# Patient Record
Sex: Female | Born: 1980 | Race: Black or African American | Hispanic: No | Marital: Single | Smoking: Never smoker
Health system: Southern US, Community
[De-identification: ages and names within clinical notes are randomized; demographics above are authoritative.]

## PROBLEM LIST (undated history)

## (undated) DIAGNOSIS — G4733 Obstructive sleep apnea (adult) (pediatric): Secondary | ICD-10-CM

## (undated) DIAGNOSIS — O10919 Unspecified pre-existing hypertension complicating pregnancy, unspecified trimester: Secondary | ICD-10-CM

## (undated) DIAGNOSIS — E785 Hyperlipidemia, unspecified: Secondary | ICD-10-CM

## (undated) DIAGNOSIS — R7303 Prediabetes: Secondary | ICD-10-CM

## (undated) DIAGNOSIS — R000 Tachycardia, unspecified: Secondary | ICD-10-CM

## (undated) DIAGNOSIS — N184 Chronic kidney disease, stage 4 (severe): Secondary | ICD-10-CM

## (undated) DIAGNOSIS — I272 Pulmonary hypertension, unspecified: Secondary | ICD-10-CM

## (undated) DIAGNOSIS — R600 Localized edema: Secondary | ICD-10-CM

## (undated) DIAGNOSIS — E119 Type 2 diabetes mellitus without complications: Secondary | ICD-10-CM

## (undated) DIAGNOSIS — J189 Pneumonia, unspecified organism: Secondary | ICD-10-CM

## (undated) DIAGNOSIS — O24419 Gestational diabetes mellitus in pregnancy, unspecified control: Secondary | ICD-10-CM

## (undated) DIAGNOSIS — I10 Essential (primary) hypertension: Secondary | ICD-10-CM

## (undated) DIAGNOSIS — R0602 Shortness of breath: Secondary | ICD-10-CM

## (undated) DIAGNOSIS — R339 Retention of urine, unspecified: Secondary | ICD-10-CM

## (undated) DIAGNOSIS — I110 Hypertensive heart disease with heart failure: Secondary | ICD-10-CM

## (undated) DIAGNOSIS — J411 Mucopurulent chronic bronchitis: Secondary | ICD-10-CM

## (undated) DIAGNOSIS — I5032 Chronic diastolic (congestive) heart failure: Secondary | ICD-10-CM

## (undated) HISTORY — DX: Hyperlipidemia, unspecified: E78.5

## (undated) HISTORY — DX: Tachycardia, unspecified: R00.0

## (undated) HISTORY — DX: Localized edema: R60.0

## (undated) HISTORY — DX: Pulmonary hypertension, unspecified: I27.20

## (undated) HISTORY — DX: Essential (primary) hypertension: I10

## (undated) HISTORY — DX: Hypertensive heart disease with heart failure: I11.0

## (undated) HISTORY — DX: Shortness of breath: R06.02

## (undated) HISTORY — DX: Morbid (severe) obesity due to excess calories: E66.01

## (undated) HISTORY — DX: Type 2 diabetes mellitus without complications: E11.9

## (undated) HISTORY — DX: Chronic kidney disease, stage 4 (severe): N18.4

## (undated) HISTORY — DX: Obstructive sleep apnea (adult) (pediatric): G47.33

## (undated) HISTORY — DX: Prediabetes: R73.03

## (undated) HISTORY — DX: Retention of urine, unspecified: R33.9

## (undated) HISTORY — DX: Chronic diastolic (congestive) heart failure: I50.32

## (undated) HISTORY — DX: Pneumonia, unspecified organism: J18.9

## (undated) HISTORY — DX: Mucopurulent chronic bronchitis: J41.1

---

## 2011-05-14 ENCOUNTER — Emergency Department (HOSPITAL_COMMUNITY)
Admission: EM | Admit: 2011-05-14 | Discharge: 2011-05-14 | Disposition: A | Discharge status: Home / Self Care | Payer: PRIVATE HEALTH INSURANCE | Attending: Emergency Medicine | Admitting: Emergency Medicine

## 2011-05-14 ENCOUNTER — Other Ambulatory Visit: Payer: Self-pay | Admitting: *Deleted

## 2011-05-14 DIAGNOSIS — R000 Tachycardia, unspecified: Secondary | ICD-10-CM

## 2011-05-14 DIAGNOSIS — I10 Essential (primary) hypertension: Secondary | ICD-10-CM

## 2011-05-14 DIAGNOSIS — G4730 Sleep apnea, unspecified: Secondary | ICD-10-CM

## 2011-05-14 LAB — POCT I-STAT, CHEM 8
BUN: 16 mg/dL (ref 6–23)
Calcium, Ion: 1.08 mmol/L — ABNORMAL LOW (ref 1.12–1.32)
Chloride: 95 mEq/L — ABNORMAL LOW (ref 96–112)
Creatinine, Ser: 0.8 mg/dL (ref 0.50–1.10)
Glucose, Bld: 105 mg/dL — ABNORMAL HIGH (ref 70–99)
HCT: 54 % — ABNORMAL HIGH (ref 36.0–46.0)
Hemoglobin: 18.4 g/dL — ABNORMAL HIGH (ref 12.0–15.0)
Potassium: 3.4 mEq/L — ABNORMAL LOW (ref 3.5–5.1)
Sodium: 138 mEq/L (ref 135–145)
TCO2: 32 mmol/L (ref 0–100)

## 2011-05-14 LAB — POCT PREGNANCY, URINE: Preg Test, Ur: NEGATIVE

## 2011-05-14 LAB — TSH: TSH: 2.615 u[IU]/mL (ref 0.350–4.500)

## 2011-05-17 ENCOUNTER — Encounter: Payer: Self-pay | Admitting: Physician Assistant

## 2011-05-18 ENCOUNTER — Encounter: Payer: Self-pay | Admitting: Physician Assistant

## 2011-05-18 ENCOUNTER — Ambulatory Visit (INDEPENDENT_AMBULATORY_CARE_PROVIDER_SITE_OTHER): Payer: PRIVATE HEALTH INSURANCE | Admitting: Physician Assistant

## 2011-05-18 VITALS — BP 182/114 | HR 107 | Resp 18 | Ht 65.0 in | Wt 302.8 lb

## 2011-05-18 DIAGNOSIS — I10 Essential (primary) hypertension: Secondary | ICD-10-CM

## 2011-05-18 LAB — BASIC METABOLIC PANEL
BUN: 10 mg/dL (ref 6–23)
CO2: 31 mEq/L (ref 19–32)
Calcium: 9.6 mg/dL (ref 8.4–10.5)
Chloride: 100 mEq/L (ref 96–112)
Creatinine, Ser: 0.7 mg/dL (ref 0.4–1.2)
GFR: 133.62 mL/min (ref >60.00)
Glucose, Bld: 110 mg/dL — ABNORMAL HIGH (ref 70–99)
Potassium: 4.7 mEq/L (ref 3.5–5.1)
Sodium: 140 mEq/L (ref 135–145)

## 2011-05-18 MED ORDER — CARVEDILOL 6.25 MG PO TABS: 6.2500 mg | ORAL_TABLET | Freq: Two times a day (BID) | ORAL | Status: DC

## 2011-05-18 NOTE — Assessment & Plan Note (Addendum)
Still uncontrolled.  She is still asymptomatic.  She will continue on Lotrel.  She will have an echo and renal arterial dopplers done soon.  She will also have a sleep study.  I will put her on coreg 6.25 mg BID.  Discussed this with Dr. Haroldine Laws.  Not sure why she is tachycardic.  As noted, TSH was ok and Hgb was ok.  Would not push beta blocker much until we know why she is tachycardic.  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  I will check a BMET today to follow up on her K+ and creatinine.  She will have this repeated in a week.  I will see her back in a week in follow up.  Start spironolactone at that time if renal fxn and K+ stable and her BP remains up.

## 2011-05-18 NOTE — Assessment & Plan Note (Signed)
We discussed the importance of weight loss in terms of helping her BP.  She is committed to losing weight.

## 2011-05-18 NOTE — Patient Instructions (Signed)
Your physician recommends that you schedule a follow-up appointment in: 05/25/11 @ 11 AM TO SEE SCOTT WEAVER, PA-C SAME DAY DR. Haroldine Laws IS IN THE OFFICE PER SCOTT WEVAER, PA-C  Your physician has recommended you make the following change in your medication: START COREG 6.25 MG 1 TABLET TWICE DAILY AND STAY ON THE [place] 20/10 MG   Your physician recommends that you return for lab work in: TODAY BMET 401.0 HTN; .... REPEAT BMET 05/25/11 AT [facility], PA-C APPT.

## 2011-05-18 NOTE — Progress Notes (Signed)
History of Present Illness: Primary Cardiologist:  Dr. Glori Bickers  [name] [name] is a 30 y.o. female who presents for follow up on BP.  She was seen in the ED by Dr. Haroldine Laws on 9/21 with a very high BP.  She was seen at her PCP office that day for a physical and her BP was 226/128.  It came down to 160/110.  She was noted to by tachycardic.  She is morbidly obese.  She was completely asymptomatic.  She was placed on Lotrel 20/10 mg QD and scheduled for follow up today.  She is set to have an echo, renal arterial dopplers and a sleep study to evaluate for sleep apnea.  Labs in the ED: Hgb 18.4, K 3.4, creatinine 0.8, TSH 2.615, HCG neg.  The patient denies chest pain, shortness of breath, syncope, orthopnea, PND or significant pedal edema.  No headache, blurry vision or difficulty with speech.    Past Medical History  Diagnosis Date  . Sinus tachycardia   . Morbid obesity   . Hypertension     Current Outpatient Prescriptions  Medication Sig Dispense Refill  . amLODipine-benazepril (LOTREL) 10-20 MG per capsule Take 1 capsule by mouth daily.          Allergies: No Known Allergies  Social HX: nonsmoker.  She is a Geologist, engineering.  Family HX:  Significant for HTN in her mother and father.  Vital Signs: BP 182/114  Pulse 107  Resp 18  Ht _0  (1.651 m)  Wt 302 lb 12.8 oz (137.349 kg)  BMI 50.39 kg/m2 Repeat BP:  L 160/110; R 180/110  PHYSICAL EXAM: Well nourished, well developed, in no acute distress HEENT: normal Neck: I cannot appreciate JVD Cardiac:  normal S1, S2; RRR; no murmur Lungs:  clear to auscultation bilaterally, no wheezing, rhonchi or rales Abd: soft, nontender, no hepatomegaly Ext: trace bilateral edema Skin: warm and dry Neuro:  CNs 2-12 intact, no focal abnormalities noted Psych:  Tearful at times  EKG:  Sinus tachy, HR 107, leftward axis, BAE, PRWP, NSSTTW changes  ASSESSMENT AND PLAN:

## 2011-05-25 ENCOUNTER — Encounter: Payer: Self-pay | Admitting: Physician Assistant

## 2011-05-25 ENCOUNTER — Ambulatory Visit (INDEPENDENT_AMBULATORY_CARE_PROVIDER_SITE_OTHER): Payer: PRIVATE HEALTH INSURANCE | Admitting: Physician Assistant

## 2011-05-25 ENCOUNTER — Other Ambulatory Visit: Payer: PRIVATE HEALTH INSURANCE | Admitting: *Deleted

## 2011-05-25 VITALS — BP 158/86 | HR 88 | Ht 65.0 in | Wt 369.0 lb

## 2011-05-25 DIAGNOSIS — I10 Essential (primary) hypertension: Secondary | ICD-10-CM

## 2011-05-25 MED ORDER — SPIRONOLACTONE 25 MG PO TABS: 12.5000 mg | ORAL_TABLET | Freq: Every day | ORAL | Status: DC

## 2011-05-25 NOTE — Assessment & Plan Note (Signed)
Much improved.  Will continue current meds.  Start Spironolactone 25 mg 1/2 tab QD.  Check BMET in one week.  Follow up in one month after tests completed.

## 2011-05-25 NOTE — Progress Notes (Signed)
History of Present Illness: Primary Cardiologist:  Dr. Glori Bickers  [name] [name] is a 30 y.o. female who presents for follow up on BP.  I saw her 9/25 after a visit to the ED.  She was placed on Lotrel.  I placed her on coreg 6.25 mg BID.  Follow up BMET with K+ 4.7 and creatinine 0.7.  The patient denies chest pain, shortness of breath, syncope, orthopnea, PND.  She has some pedal edema especially after standing for prolonged periods.  Sleep test is next week and echo and renal dopplers are later this month.   Past Medical History  Diagnosis Date  . Sinus tachycardia   . Morbid obesity   . Hypertension     Current Outpatient Prescriptions  Medication Sig Dispense Refill  . amLODipine-benazepril (LOTREL) 10-20 MG per capsule Take 1 capsule by mouth daily.        . carvedilol (COREG) 6.25 MG tablet Take 1 tablet (6.25 mg total) by mouth 2 (two) times daily with a meal.  60 tablet  11    Allergies: No Known Allergies  Social HX: nonsmoker.  She is a Geologist, engineering.  Vital Signs: BP 158/86  Pulse 88  Ht 5' 5" (1.651 m)  Wt 369 lb (167.377 kg)  BMI 61.40 kg/m2  PHYSICAL EXAM: Well nourished, well developed, in no acute distress HEENT: normal Neck: I cannot appreciate JVD Cardiac:  normal S1, S2; RRR; no murmur Lungs:  clear to auscultation bilaterally, no wheezing, rhonchi or rales Abd: soft, nontender, no hepatomegaly Ext: trace bilateral edema Skin: warm and dry Neuro:  CNs 2-12 intact, no focal abnormalities noted  ASSESSMENT AND PLAN:

## 2011-05-25 NOTE — Patient Instructions (Signed)
Your physician recommends that you schedule a follow-up appointment in: [place], PA-C [place] IS IN THE OFFICE.  Your physician has recommended you make the following change in your medication: START SPIRONOLACTONE 25 MG TAKE 1/2 (HALF) TABLET DAILY.  Your physician recommends that you return for lab work in: 06/01/11 FOR REPEAT BMET 401.1 HTN.

## 2011-06-01 ENCOUNTER — Other Ambulatory Visit (INDEPENDENT_AMBULATORY_CARE_PROVIDER_SITE_OTHER): Payer: PRIVATE HEALTH INSURANCE | Admitting: *Deleted

## 2011-06-01 ENCOUNTER — Encounter (HOSPITAL_BASED_OUTPATIENT_CLINIC_OR_DEPARTMENT_OTHER): Payer: PRIVATE HEALTH INSURANCE

## 2011-06-01 DIAGNOSIS — I10 Essential (primary) hypertension: Secondary | ICD-10-CM

## 2011-06-01 LAB — BASIC METABOLIC PANEL
BUN: 11 mg/dL (ref 6–23)
CO2: 29 mEq/L (ref 19–32)
Calcium: 9.2 mg/dL (ref 8.4–10.5)
Chloride: 99 mEq/L (ref 96–112)
Creatinine, Ser: 0.9 mg/dL (ref 0.4–1.2)
GFR: 91.06 mL/min (ref >60.00)
Glucose, Bld: 89 mg/dL (ref 70–99)
Potassium: 4 mEq/L (ref 3.5–5.1)
Sodium: 138 mEq/L (ref 135–145)

## 2011-06-02 ENCOUNTER — Ambulatory Visit (HOSPITAL_BASED_OUTPATIENT_CLINIC_OR_DEPARTMENT_OTHER): Payer: PRIVATE HEALTH INSURANCE | Attending: Internal Medicine

## 2011-06-02 DIAGNOSIS — G4730 Sleep apnea, unspecified: Secondary | ICD-10-CM

## 2011-06-02 DIAGNOSIS — G4733 Obstructive sleep apnea (adult) (pediatric): Secondary | ICD-10-CM | POA: Insufficient documentation

## 2011-06-06 NOTE — Consult Note (Signed)
[name], [name]               ACCOUNT NO.:  [id number]  MEDICAL RECORD NO.:  [id number]  LOCATION:  [facility]                         FACILITY:  [facility]  PHYSICIAN:  Shaune Pascal. Sou Nohr, MDDATE OF BIRTH:  [birth year]  DATE OF CONSULTATION:  05/14/2011 DATE OF DISCHARGE:                                CONSULTATION   REQUESTING PHYSICIAN:  Dr. Roe Coombs at [facility].  REASON FOR CONSULTATION:  Malignant hypertension.  [name] [name] is a very pleasant 30 year old woman with a history of morbid obesity and hypertension.  She previously was taking blood pressure medicines, but lost her job about a year ago and stopped.  She returned today to the [facility] for physical and her blood pressure was noted to be 226/128 and this was totally asymptomatic.  Given the severity her hypertension, she was brought to the [facility] Emergency Room.  On the arrival to the emergency room, blood pressure was about 160/110.  She was in no acute distress.  She denied any palpitations. No chest pain, no dyspnea, no orthopnea, no PND, no focal neurologic deficits.  She is very eager to go home.  REVIEW OF SYSTEMS:  Notable for mild lower extremity edema and snoring as well as fatigue.  The remainder of review of systems are all systems are negative except for HPI and problem list.  PAST MEDICAL HISTORY: 1. Morbid obesity. 2. Hypertension.  MEDICATIONS:  None.  ALLERGIES:  None.  SOCIAL HISTORY:  She lives in [place] alone.  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  She denies any tobacco or alcohol use.  FAMILY HISTORY:  Mother is alive, has hypertension.  Father is alive, but does not know him well.  PHYSICAL EXAMINATION:  GENERAL:  She is a morbidly obese woman in no acute distress.  She is ambulating around the ER without symptoms. VITAL SIGNS:  Blood pressure is 160/110.  Heart rate is 109. HEENT:  Normal. NECK:  Thick and supple.  Unable to see JVD.  Carotids are  2+ bilaterally.  There is no obvious lymphadenopathy or thyromegaly, though it is hard to assess. CARDIAC:  PMI is not palpable.  She is regular and tachycardic.  No obvious murmurs. LUNGS:  Clear. ABDOMEN:  Markedly obese, nontender, good bowel sounds, otherwise exam is limited. EXTREMITIES:  Warm with no cyanosis or clubbing.  There is trace pitting edema. NEURO:  She is alert and oriented x3.  Cranial nerves II-XII are intact. Moves all 4 extremities without difficulty.  Affect is pleasant.  She is nonfocal.  EKG shows sinus tachycardia with biatrial enlargement.  There is LVH. No significant ST-T wave abnormalities.  LABS:  Pending.  ASSESSMENT: 1. Malignant hypertension. 2. Morbid obesity. 3. Probable sleep apnea. 4. Sinus tachycardia.  PLAN/DISCUSSION:  She has been seen by myself as well as Riki Altes, the ER physician.  Her blood pressure is starting to come down.  She is totally asymptomatic and very eager to go home.  We discussed possible admission, but she is reluctant about this.  We will check her labs. This will include electrolytes, renal function panel, thyroid panel, and pregnancy test.  We will go ahead and start her  on Lotrel 20/10.  I have scheduled her a followup to see Richardson Dopp in our office on Tuesday at 11:30 a.m. for followup blood pressure checked.  She will also need an echocardiogram to further evaluate her tachycardia and LV function as well as a renal artery ultrasound to rule out fibromuscular dysplasia. Finally, we have discussed with her the need for an outpatient sleep study.  I suspect her blood pressure will remain up and we can continue bring this down, would consider adding spironolactone as a secondary agent.  We had a discussion with her that if she develops any symptoms such as headache, chest pain, or focal neurologic deficit, she should call 911 immediately.  We will need to follow her closely.     Shaune Pascal. Jamese Trauger,  MD     DRB/MEDQ  D:  05/14/2011  T:  05/14/2011  Job:  [id number]  Electronically Signed by Glori Bickers MD on 06/06/2011 02:44:27 PM

## 2011-06-11 DIAGNOSIS — G4733 Obstructive sleep apnea (adult) (pediatric): Secondary | ICD-10-CM

## 2011-06-11 NOTE — Procedures (Signed)
[name] [name], [name] [name]               ACCOUNT NO.:  [id number]  MEDICAL RECORD NO.:  [id number]          PATIENT TYPE:  OUT  LOCATION:  SLEEP CENTER                 FACILITY:  [facility]  PHYSICIAN:  Kathee Delton, MD,FCCPDATE OF BIRTH:  26-[birth year]1979  DATE OF STUDY:  06/02/2011                           NOCTURNAL POLYSOMNOGRAM  REFERRING PHYSICIAN:  Shaune Pascal. Bensimhon, MD  INDICATION FOR STUDY:  Hypersomnia with sleep apnea.  EPWORTH SLEEPINESS SCORE:  3.  MEDICATIONS:  SLEEP ARCHITECTURE:  The patient had a total sleep time of 186 minutes with no slow-wave sleep and no REM.  Sleep onset latency was prolonged at 40 minutes, and sleep efficiency was poor at 52%.  RESPIRATORY DATA:  The patient was found to have no obstructive apneas, but 232 obstructive hypopneas, giving her an apnea-hypopnea index of 75 events per hour.  Most of the events occurred on her right side, and there was mild snoring noted throughout.  The patient was also and noted to have frequent episodes of what appeared to be decreased airflow due to hypoventilation with significant O2 desaturation.  The patient did not meet split night criteria secondary to the majority of her events, and total sleep time, occurring after 2 a.m.  OXYGEN DATA:  There was O2 desaturation as low as 72% with the patient's obstructive events and also her hypoventilation episodes.  CARDIAC DATA:  No clinically significant arrhythmias were noted.  MOVEMENT-PARASOMNIA:  The patient had no significant leg jerks or other abnormal behaviors.  IMPRESSIONS-RECOMMENDATIONS: 1. Severe obstructive sleep apnea/hypopnea syndrome with an     apnea/hypopnea index of 75 events per hour, and O2 desaturation as     low as 72%.  The patient did not meet split night protocol     secondary to most of her events and sleep time occurring after 2     a.m.  Treatment for this degree of sleep apnea should focus on     CPAP, as well as aggressive weight  loss. 2. The patient was noted to have frequent episodes of generalized     decreased airflow most consistent with hypoventilation.  There was     significant O2 desaturation during these periods.     Kathee Delton, MD,FCCP [place], [place] Board of Sleep Medicine Electronically Signed    [facility]/MEDQ  D:  06/11/2011 09:37:37  T:  06/11/2011 14:46:29  Job:  [id number]

## 2011-06-17 ENCOUNTER — Ambulatory Visit (HOSPITAL_COMMUNITY): Payer: PRIVATE HEALTH INSURANCE | Attending: Cardiovascular Disease | Admitting: Radiology

## 2011-06-17 ENCOUNTER — Telehealth: Payer: Self-pay | Admitting: *Deleted

## 2011-06-17 ENCOUNTER — Encounter (INDEPENDENT_AMBULATORY_CARE_PROVIDER_SITE_OTHER): Payer: PRIVATE HEALTH INSURANCE | Admitting: Cardiology

## 2011-06-17 DIAGNOSIS — I059 Rheumatic mitral valve disease, unspecified: Secondary | ICD-10-CM | POA: Insufficient documentation

## 2011-06-17 DIAGNOSIS — I10 Essential (primary) hypertension: Secondary | ICD-10-CM

## 2011-06-17 DIAGNOSIS — I079 Rheumatic tricuspid valve disease, unspecified: Secondary | ICD-10-CM | POA: Insufficient documentation

## 2011-06-17 DIAGNOSIS — R609 Edema, unspecified: Secondary | ICD-10-CM

## 2011-06-17 DIAGNOSIS — R000 Tachycardia, unspecified: Secondary | ICD-10-CM

## 2011-06-17 DIAGNOSIS — M7989 Other specified soft tissue disorders: Secondary | ICD-10-CM | POA: Insufficient documentation

## 2011-06-17 DIAGNOSIS — I379 Nonrheumatic pulmonary valve disorder, unspecified: Secondary | ICD-10-CM | POA: Insufficient documentation

## 2011-06-17 MED ORDER — AMLODIPINE BESY-BENAZEPRIL HCL 10-20 MG PO CAPS: 1.0000 | ORAL_CAPSULE | Freq: Every day | ORAL | Status: DC

## 2011-06-17 NOTE — Telephone Encounter (Signed)
Ok to refill Richardson Dopp, PA-C

## 2011-06-17 NOTE — Telephone Encounter (Signed)
Sherri from [facility] calls.  Pt is out of her amlodipine-benazepril. Reviewed chart and will call in refill. Pt is now aware of appt with Scott PA on 07/07/11 at 9:30am. Horton Chin RN

## 2011-07-07 ENCOUNTER — Ambulatory Visit: Payer: PRIVATE HEALTH INSURANCE | Admitting: Physician Assistant

## 2011-07-09 ENCOUNTER — Ambulatory Visit (INDEPENDENT_AMBULATORY_CARE_PROVIDER_SITE_OTHER): Payer: PRIVATE HEALTH INSURANCE | Admitting: Physician Assistant

## 2011-07-09 ENCOUNTER — Encounter: Payer: Self-pay | Admitting: Physician Assistant

## 2011-07-09 VITALS — BP 200/133 | HR 97 | Ht 65.0 in | Wt 360.0 lb

## 2011-07-09 DIAGNOSIS — G4733 Obstructive sleep apnea (adult) (pediatric): Secondary | ICD-10-CM

## 2011-07-09 DIAGNOSIS — I10 Essential (primary) hypertension: Secondary | ICD-10-CM

## 2011-07-09 DIAGNOSIS — G4730 Sleep apnea, unspecified: Secondary | ICD-10-CM

## 2011-07-09 HISTORY — DX: Obstructive sleep apnea (adult) (pediatric): G47.33

## 2011-07-09 MED ORDER — CARVEDILOL PHOSPHATE ER 20 MG PO CP24: 20.0000 mg | ORAL_CAPSULE | Freq: Every day | ORAL | Status: DC

## 2011-07-09 MED ORDER — SPIRONOLACTONE 25 MG PO TABS: 25.0000 mg | ORAL_TABLET | Freq: Every day | ORAL | Status: DC

## 2011-07-09 MED ORDER — CLONIDINE HCL 0.1 MG PO TABS: 0.1000 mg | ORAL_TABLET | Freq: Once | ORAL | Status: DC

## 2011-07-09 MED ORDER — CLONIDINE HCL 0.1 MG PO TABS
0.1000 mg | ORAL_TABLET | Freq: Once | ORAL | Status: AC
  Administered 2011-07-09: 0.1 mg via ORAL

## 2011-07-09 NOTE — Progress Notes (Signed)
History of Present Illness: Primary Cardiologist:  Dr. Glori Bickers  [name] [name] is a 30 y.o. female who presents for follow up on BP.  I saw her 9/25 after a visit to the ED.  She was placed on Lotrel.  I placed her on coreg 6.25 mg BID.  Follow up BMET with K+ 4.7 and creatinine 0.7.  Her sleep test returned demonstrating severe sleep apnea.  Renal dopplers were normal.  Echo 06/17/11: mild LVH, EF 50-55%, mild LAE.  Her pressure looked much better when I saw her last month.  Has a hard time remembering evening dose of coreg.  Actually missed it last night.  The patient denies chest pain, shortness of breath, syncope, orthopnea, PND or significant pedal edema.  No headaches.  No blurry vision.  Denies salt indiscretion.    Past Medical History  Diagnosis Date  . Sinus tachycardia   . Morbid obesity   . Hypertension     Current Outpatient Prescriptions  Medication Sig Dispense Refill  . amLODipine-benazepril (LOTREL) 10-20 MG per capsule Take 1 capsule by mouth daily.  30 capsule  11  . carvedilol (COREG) 6.25 MG tablet Take 1 tablet (6.25 mg total) by mouth 2 (two) times daily with a meal.  60 tablet  11  . spironolactone (ALDACTONE) 25 MG tablet Take 0.5 tablets (12.5 mg total) by mouth daily.  30 tablet  11    Allergies: No Known Allergies  Social HX: nonsmoker.  She is a Geologist, engineering.  Vital Signs: BP 200/133  Pulse 97  Ht _0  (1.651 m)  Wt 360 lb (163.295 kg)  BMI 59.91 kg/m2  Repeat BP with thigh cuff: 180/121 on left and 194/114 on right  PHYSICAL EXAM: Well nourished, well developed, in no acute distress HEENT: normal Neck: I cannot appreciate JVD Cardiac:  normal S1, S2; RRR; no murmur Lungs:  clear to auscultation bilaterally, no wheezing, rhonchi or rales Abd: soft, nontender, no hepatomegaly Ext: trace bilateral edema Skin: warm and dry Neuro:  CNs 2-12 intact, no focal abnormalities noted  EKG:  NSR, HR 97, normal axis, PRWP, NSSTTW  changes  ASSESSMENT AND PLAN:

## 2011-07-09 NOTE — Assessment & Plan Note (Signed)
Refer to Sleep Medicine.  She will need this treated to help get her BP under control.

## 2011-07-09 NOTE — Assessment & Plan Note (Addendum)
BP uncontrolled.  She needs sleep apnea treated.  Also needs BP meds adjusted.  She cannot remember evening dose of coreg.  Will change to Coreg CR 20 mg QD.  Increase spironolactone to 25 mg one tab QD.  Check BMET in one week.  Follow up in 2 weeks.  We gave clonidine in the office prior to discharge to get her BP down.

## 2011-07-09 NOTE — Patient Instructions (Addendum)
Your physician recommends that you schedule a follow-up appointment in: 07/28/11 @ 12:00 TO SEE SCOTT WEAVER, PA-C  NURSE VISIT 07/14/11 @ 9 AM TO HAVE A BLOOD PRESSURE CHECK AND LAB WORK (BMET) 401.1  Your physician has recommended you make the following change in your medication: STOP COREG;  START COREG CR 20 MG 1 TABLET DAILY; INCREASE SPIRONOLACTONE (ALDACTONE) TO 25 MG DAILY  You have been referred to DR. CLANCE OR DR. SOOD FOR SLEEP APNEA 780.57

## 2011-07-09 NOTE — Assessment & Plan Note (Signed)
We discussed the importance of weight loss in treating her OSA and her HTN.  I suggested she try [employer].

## 2011-07-12 ENCOUNTER — Telehealth: Payer: Self-pay | Admitting: Physician Assistant

## 2011-07-12 NOTE — Telephone Encounter (Signed)
F/u message:  Pt saw number on her phone.  No message left.  Returning a call

## 2011-07-12 NOTE — Telephone Encounter (Signed)
Pt was confused because she was given one clonidine pill at the pharmacy and wondered why.  Upon looking at the records, it appears that she was given clonidine in the office once to get her BP down prior to discharge.  Pt was notified of this.

## 2011-07-12 NOTE — Telephone Encounter (Signed)
New problem Pt wants to discuss her meds She is not sure how to take meds

## 2011-07-12 NOTE — Telephone Encounter (Signed)
N/A.  [place] if needed

## 2011-07-14 ENCOUNTER — Other Ambulatory Visit (INDEPENDENT_AMBULATORY_CARE_PROVIDER_SITE_OTHER): Payer: PRIVATE HEALTH INSURANCE | Admitting: *Deleted

## 2011-07-14 ENCOUNTER — Ambulatory Visit (INDEPENDENT_AMBULATORY_CARE_PROVIDER_SITE_OTHER): Payer: PRIVATE HEALTH INSURANCE

## 2011-07-14 ENCOUNTER — Other Ambulatory Visit: Payer: PRIVATE HEALTH INSURANCE | Admitting: *Deleted

## 2011-07-14 DIAGNOSIS — I10 Essential (primary) hypertension: Secondary | ICD-10-CM

## 2011-07-14 LAB — BASIC METABOLIC PANEL
BUN: 9 mg/dL (ref 6–23)
CO2: 31 mEq/L (ref 19–32)
Calcium: 9.1 mg/dL (ref 8.4–10.5)
Chloride: 100 mEq/L (ref 96–112)
Creatinine, Ser: 0.6 mg/dL (ref 0.4–1.2)
GFR: 149.01 mL/min (ref >60.00)
Glucose, Bld: 108 mg/dL — ABNORMAL HIGH (ref 70–99)
Potassium: 3.9 mEq/L (ref 3.5–5.1)
Sodium: 137 mEq/L (ref 135–145)

## 2011-07-14 MED ORDER — POTASSIUM CHLORIDE ER 10 MEQ PO TBCR: 10.0000 meq | EXTENDED_RELEASE_TABLET | Freq: Every day | ORAL | Status: DC

## 2011-07-14 MED ORDER — CARVEDILOL PHOSPHATE ER 40 MG PO CP24: 40.0000 mg | ORAL_CAPSULE | Freq: Every day | ORAL | Status: DC

## 2011-07-14 MED ORDER — HYDROCHLOROTHIAZIDE 25 MG PO TABS: 25.0000 mg | ORAL_TABLET | Freq: Every day | ORAL | Status: DC

## 2011-07-14 NOTE — Progress Notes (Signed)
Patient came to office for b/p check.B/p in lf.arm with large cuff 184/133 pulse 82.After 5 min. 188/133,pulse 80.Took Coreg Cr 20 mg.Spironolactone 25 mg.Amlodipine/Benazepril 10/20 mg this morning Checked with DOD Dr. Cathie Olden was told to increase Coreg Cr to 90m daily,add HCTZ 236mdaily,and add KCL 1053maily. Lose weight and make Appointment in 2 weeks. Patient was advised.

## 2011-07-28 ENCOUNTER — Ambulatory Visit: Payer: PRIVATE HEALTH INSURANCE | Admitting: Physician Assistant

## 2011-07-30 ENCOUNTER — Encounter: Payer: PRIVATE HEALTH INSURANCE | Admitting: Cardiology

## 2011-08-02 ENCOUNTER — Institutional Professional Consult (permissible substitution): Payer: PRIVATE HEALTH INSURANCE | Admitting: Pulmonary Disease

## 2011-08-12 ENCOUNTER — Ambulatory Visit: Payer: Self-pay | Admitting: Physician Assistant

## 2011-09-09 ENCOUNTER — Institutional Professional Consult (permissible substitution): Payer: Self-pay | Admitting: Pulmonary Disease

## 2012-07-28 ENCOUNTER — Other Ambulatory Visit: Payer: Self-pay

## 2012-07-28 DIAGNOSIS — I10 Essential (primary) hypertension: Secondary | ICD-10-CM

## 2012-07-31 NOTE — Telephone Encounter (Signed)
I do not think I know this patient.  We have not seen her in over a year.  It looks like she has seen scott Kathlen Mody

## 2012-08-03 ENCOUNTER — Other Ambulatory Visit: Payer: Self-pay | Admitting: *Deleted

## 2012-08-04 ENCOUNTER — Telehealth: Payer: Self-pay | Admitting: *Deleted

## 2012-08-04 DIAGNOSIS — I10 Essential (primary) hypertension: Secondary | ICD-10-CM

## 2012-08-04 MED ORDER — HYDROCHLOROTHIAZIDE 25 MG PO TABS: 25.0000 mg | ORAL_TABLET | Freq: Every day | ORAL | Status: DC

## 2012-08-04 MED ORDER — POTASSIUM CHLORIDE ER 10 MEQ PO TBCR: 10.0000 meq | EXTENDED_RELEASE_TABLET | Freq: Every day | ORAL | Status: DC

## 2012-08-04 NOTE — Telephone Encounter (Signed)
lm on patients voicemail stating that we are only allow to fill 30 tablets until she comes in to see doctor due to her not being in office for a while. number provided to call back.

## 2012-08-23 NOTE — L&D Delivery Note (Signed)
Delivery Note Pt pushed well and at 5:37 AM a viable female was delivered via Vaginal, Spontaneous Delivery (Presentation: OA).  APGAR: 9, 9; weight pending .   Placenta status: Intact, Manual removal.  Cord: 3 vessels with the following complications: True Knot and nuchal cord x 1.   Mild uterine atony responding to bimanual massage  Anesthesia: Epidural  Episiotomy: None Lacerations: Second degree Suture Repair: 3.0 vicryl rapide Est. Blood Loss (mL): 450cc  Mom to postpartum.  Baby to stay with mom for now.  Logan Bores 05/26/2013, 6:13 AM

## 2012-08-24 ENCOUNTER — Other Ambulatory Visit: Payer: Self-pay | Admitting: Physician Assistant

## 2012-08-24 DIAGNOSIS — I10 Essential (primary) hypertension: Secondary | ICD-10-CM

## 2012-08-28 ENCOUNTER — Telehealth: Payer: Self-pay | Admitting: Cardiovascular Disease

## 2012-08-28 DIAGNOSIS — I10 Essential (primary) hypertension: Secondary | ICD-10-CM

## 2012-08-28 MED ORDER — CARVEDILOL PHOSPHATE ER 40 MG PO CP24: 40.0000 mg | ORAL_CAPSULE | Freq: Every day | ORAL | Status: DC

## 2012-08-28 NOTE — Telephone Encounter (Signed)
Pt needs refill of coreg cr 30 days

## 2012-08-28 NOTE — Telephone Encounter (Signed)
RX sent into pharmacy. LMOM.

## 2012-08-31 ENCOUNTER — Other Ambulatory Visit: Payer: Self-pay | Admitting: *Deleted

## 2012-08-31 NOTE — Telephone Encounter (Signed)
Opened in Error

## 2012-09-07 ENCOUNTER — Ambulatory Visit: Payer: PRIVATE HEALTH INSURANCE | Admitting: Physician Assistant

## 2012-10-20 ENCOUNTER — Telehealth: Payer: Self-pay | Admitting: Cardiology

## 2012-10-20 NOTE — Telephone Encounter (Signed)
Spoke with pt, she has not been seen since 2012, explained she will need to be seen. Pt voiced understanding. appt scheduled for pt to see scott weaver pac.

## 2012-10-20 NOTE — Telephone Encounter (Signed)
New Prob    Pt just found out she is pregnant, OBGYN told her she may have to come off certain BP medications/medications. Wanted to run this by cardiologist first. Would like to speak to nurse.

## 2012-10-26 ENCOUNTER — Ambulatory Visit (INDEPENDENT_AMBULATORY_CARE_PROVIDER_SITE_OTHER): Payer: PRIVATE HEALTH INSURANCE | Admitting: Physician Assistant

## 2012-10-26 ENCOUNTER — Encounter: Payer: Self-pay | Admitting: Physician Assistant

## 2012-10-26 VITALS — BP 192/146 | HR 101 | Ht 65.0 in | Wt 376.0 lb

## 2012-10-26 DIAGNOSIS — I10 Essential (primary) hypertension: Secondary | ICD-10-CM

## 2012-10-26 DIAGNOSIS — G4730 Sleep apnea, unspecified: Secondary | ICD-10-CM

## 2012-10-26 LAB — BASIC METABOLIC PANEL
BUN: 8 mg/dL (ref 6–23)
CO2: 26 mEq/L (ref 19–32)
Calcium: 9 mg/dL (ref 8.4–10.5)
Chloride: 97 mEq/L (ref 96–112)
Creatinine, Ser: 0.6 mg/dL (ref 0.4–1.2)
GFR: 142.34 mL/min (ref >60.00)
Glucose, Bld: 96 mg/dL (ref 70–99)
Potassium: 3.9 mEq/L (ref 3.5–5.1)
Sodium: 133 mEq/L — ABNORMAL LOW (ref 135–145)

## 2012-10-26 MED ORDER — NIFEDIPINE ER OSMOTIC RELEASE 60 MG PO TB24: 60.0000 mg | ORAL_TABLET | Freq: Every day | ORAL | Status: DC

## 2012-10-26 MED ORDER — LABETALOL HCL 200 MG PO TABS: 400.0000 mg | ORAL_TABLET | Freq: Two times a day (BID) | ORAL | Status: DC

## 2012-10-26 NOTE — Progress Notes (Signed)
[address], [place], [place]  [address] Phone: [phone], Fax:  [phone]  Date:  10/26/2012   ID:  [name] [name], DOB 02/15/1981, MRN [id number]  PCP:  No primary provider on file.      History of Present Illness: [name] [name] is a 32 y.o. female who returns for follow up on HTN.  I saw her in 2012 after a visit to the ED with high BP.  She was seen by Dr. Haroldine Laws in the ED. Medicines were adjusted.  A sleep test returned demonstrating severe sleep apnea. Renal dopplers were normal. Echo 06/17/11: mild LVH, EF 50-55%, mild LAE. She has not been seen since 06/2011.  She missed a couple appointments with pulmonology to treat her sleep apnea.  She now returns to discuss management of BP in the setting of pregnancy.  She is at [redacted] weeks gestation.  The patient denies chest pain, shortness of breath, syncope, orthopnea, PND or significant pedal edema.  No headache, blurry vision, unilateral weakness, slurred speech.    Labs (9/12):    TSH 2.615 Labs (11/12):  K 3.9, creatinine 0.6   Wt Readings from Last 3 Encounters:  10/26/12 376 lb (170.552 kg)  07/09/11 360 lb (163.295 kg)  05/25/11 369 lb (167.377 kg)     Past Medical History  Diagnosis Date  . Sinus tachycardia   . Morbid obesity   . Hypertension     Current Outpatient Prescriptions  Medication Sig Dispense Refill  . carvedilol (COREG CR) 40 MG 24 hr capsule Take 1 capsule (40 mg total) by mouth daily.  30 capsule  0  . hydrochlorothiazide (HYDRODIURIL) 25 MG tablet Take 1 tablet (25 mg total) by mouth daily.  30 tablet  1  . potassium chloride (K-DUR) 10 MEQ tablet Take 1 tablet (10 mEq total) by mouth daily.  30 tablet  1  . amLODipine-benazepril (LOTREL) 10-20 MG per capsule Take 1 capsule by mouth daily.  30 capsule  11  . spironolactone (ALDACTONE) 25 MG tablet Take 1 tablet (25 mg total) by mouth daily.  30 tablet  11   No current facility-administered medications for this visit.     Allergies:   No Known Allergies  Social History:  The patient  reports that she has never smoked. She does not have any smokeless tobacco history on file. She reports that she does not drink alcohol or use illicit drugs.   ROS:  Please see the history of present illness.   She notes nausea from pregnancy.   All other systems reviewed and negative.   PHYSICAL EXAM: VS:  BP 196/128  Pulse 101  Ht _0  (1.651 m)  Wt 376 lb (170.552 kg)  BMI 62.57 kg/m2 Well nourished, well developed, in no acute distress HEENT: normal Neck: no JVD Cardiac:  normal S1, S2; RRR; no murmur Lungs:  clear to auscultation bilaterally, no wheezing, rhonchi or rales Abd: soft, nontender, no hepatomegaly Ext: no edema Skin: warm and dry Neuro:  CNs 2-12 intact, no focal abnormalities noted  EKG:  Sinus tachy, HR 101, normal axis, PRWP, no change from prior tracing     ASSESSMENT AND PLAN:  1. Malignant Hypertension:  She has uncontrolled HTN.  She has only been taking Coreg for the last several weeks.  Luckily she ran out of Lotrel before getting pregnant.  I have reviewed her case with Dr. Kirk Ruths as well as out clinical pharmacist and one of the pharmacists at [facility].  Labetalol, Nifedipine, Methyldopa as well as clonidine are generally felt to be safe.  Stop Coreg.  Start Labetalol 400 mg bid and Nifedipine XL 60 mg QD. Check a BMET today.  Close follow up in 1 week. 2. Sleep Apnea:  Refer back to pulmonology.  We discussed the importance of treating OSA in treating HTN. 3. Disposition:  Follow up with me in 1 week.  Arrange an appointment with Dr. Kirk Ruths as well in the next month.   Danton Sewer, PA-C  12:38 PM 10/26/2012

## 2012-10-26 NOTE — Patient Instructions (Addendum)
Your physician has recommended you make the following change in your medication: STOP your Coreg (Carvedilol).   START Labetolol 400 mg twice daily.                              START Nifedipine XL 60 mg daily.  Your physician recommends that you return for lab work in: today (bmet)  Your physician recommends that you schedule a follow-up appointment in: 1 week with Richardson Dopp PA-C and in 3-4 weeks with Dr Trellis Moment have been referred to sleep medicine for a follow up of your sleep study

## 2012-11-02 ENCOUNTER — Encounter: Payer: Self-pay | Admitting: Physician Assistant

## 2012-11-02 ENCOUNTER — Ambulatory Visit (INDEPENDENT_AMBULATORY_CARE_PROVIDER_SITE_OTHER): Payer: PRIVATE HEALTH INSURANCE | Admitting: Physician Assistant

## 2012-11-02 VITALS — BP 176/92 | HR 95 | Ht 65.0 in | Wt 380.0 lb

## 2012-11-02 DIAGNOSIS — G4730 Sleep apnea, unspecified: Secondary | ICD-10-CM

## 2012-11-02 DIAGNOSIS — Z331 Pregnant state, incidental: Secondary | ICD-10-CM

## 2012-11-02 DIAGNOSIS — I10 Essential (primary) hypertension: Secondary | ICD-10-CM

## 2012-11-02 DIAGNOSIS — Z3490 Encounter for supervision of normal pregnancy, unspecified, unspecified trimester: Secondary | ICD-10-CM | POA: Insufficient documentation

## 2012-11-02 MED ORDER — LABETALOL HCL 200 MG PO TABS: 400.0000 mg | ORAL_TABLET | Freq: Two times a day (BID) | ORAL | Status: DC

## 2012-11-02 NOTE — Progress Notes (Signed)
[address] [place], [place]  [address] Phone: [phone], Fax:  [phone]  Date:  11/02/2012   ID:  [name] [name], DOB 1980/10/19, MRN [id number]  PCP:  No primary provider on file.      History of Present Illness: [name] [name] is a 32 y.o. female who returns for follow up on HTN.  I saw her in 2012 after a visit to the ED with high BP.  She was seen by Dr. Haroldine Laws in the ED. Medicines were adjusted.  A sleep test returned demonstrating severe sleep apnea. Renal dopplers were normal. Echo 06/17/11: mild LVH, EF 50-55%, mild LAE. She had not been seen since 06/2011.  She missed a couple appointments with pulmonology to treat her sleep apnea.  She returned last week to discuss management of BP in the setting of pregnancy.  She was at [redacted] weeks gestation at that time.  BP was markedly elevated.  She was only on Coreg.  I reviewed her case with Dr. Stanford Breed and the PharmD at [facility].  I put her on Labetalol and Nifedipine and had her follow up today.  She still feels fine.  She denies chest pain, shortness of breath, syncope, orthopnea, PND or significant pedal edema.  No headache, blurry vision, unilateral weakness, slurred speech.    Labs (9/12):    TSH 2.615 Labs (11/12):  K 3.9, creatinine 0.6  Labs (3/14):    K 3.9, creatinine 0.6  Wt Readings from Last 3 Encounters:  11/02/12 380 lb (172.367 kg)  10/26/12 376 lb (170.552 kg)  07/09/11 360 lb (163.295 kg)     Past Medical History  Diagnosis Date  . Sinus tachycardia   . Morbid obesity   . Hypertension     Current Outpatient Prescriptions  Medication Sig Dispense Refill  . labetalol (NORMODYNE) 200 MG tablet Take 2 tablets (400 mg total) by mouth 2 (two) times daily.  60 tablet  6  . NIFEdipine (PROCARDIA XL/ADALAT-CC) 60 MG 24 hr tablet Take 1 tablet (60 mg total) by mouth daily.  30 tablet  6   No current facility-administered medications for this visit.    Allergies:   No Known  Allergies  Social History:  The patient  reports that she has never smoked. She does not have any smokeless tobacco history on file. She reports that she does not drink alcohol or use illicit drugs.   ROS:  Please see the history of present illness.   She notes nausea from pregnancy.   All other systems reviewed and negative.   PHYSICAL EXAM: VS:  BP 176/92  Pulse 95  Ht _0  (1.651 m)  Wt 380 lb (172.367 kg)  BMI 63.24 kg/m2 Well nourished, well developed, in no acute distress HEENT: normal Neck: no JVD Cardiac:  normal S1, S2; RRR; no murmur Lungs:  clear to auscultation bilaterally, no wheezing, rhonchi or rales Abd: soft, nontender, no hepatomegaly Ext: no edema Skin: warm and dry Neuro:  CNs 2-12 intact, no focal abnormalities noted  EKG:  NSR, HR 95, normal axis, no change from prior tracing     ASSESSMENT AND PLAN:  1. Malignant Hypertension:  Unfortunately, she is only taking labetalol 200 mg bid.  Will increase this to 400 mg bid.  BP is improved.  Will follow closely to see how much BP improves with this and then decide on next medication adjustment. 2. Sleep Apnea:  F/u with pulmonology.  We discussed the importance of treating OSA in  treating HTN. 3. Obesity:  Refer to dietician. 4. Pregnancy:  She is to see her Ob next week. 5. Disposition:  Follow up with me in 1 week.     Signed, Richardson Dopp, PA-C  11:50 AM 11/02/2012

## 2012-11-02 NOTE — Patient Instructions (Addendum)
You have been referred to DIETICIAN DX HTN, GESTATION (PREGNANCY)   PLEASE FOLLOW UP WITH SC[facility] 11/10/12 @ 11:30

## 2012-11-10 ENCOUNTER — Encounter: Payer: Self-pay | Admitting: Physician Assistant

## 2012-11-10 ENCOUNTER — Ambulatory Visit (INDEPENDENT_AMBULATORY_CARE_PROVIDER_SITE_OTHER): Payer: PRIVATE HEALTH INSURANCE | Admitting: Physician Assistant

## 2012-11-10 VITALS — BP 168/100 | HR 80 | Ht 65.0 in | Wt 380.4 lb

## 2012-11-10 DIAGNOSIS — I10 Essential (primary) hypertension: Secondary | ICD-10-CM

## 2012-11-10 MED ORDER — NIFEDIPINE ER OSMOTIC RELEASE 60 MG PO TB24: 120.0000 mg | ORAL_TABLET | Freq: Every day | ORAL | Status: DC

## 2012-11-10 NOTE — Patient Instructions (Addendum)
**Note De-Identified Lynnea Vandervoort Obfuscation** Your physician has recommended you make the following change in your medication: increase Procardia to 120 mg daily( take 2 tablets daily to =120 mg)  Your provider recommends that you see a dietician, we will arrange.  Your physician has recommended you make the following change in your medication: already arranged

## 2012-11-10 NOTE — Progress Notes (Signed)
[address], [place], [place]  [address] Phone: [phone], Fax:  [phone]  Date:  11/10/2012   ID:  [name] [name], DOB 09/10/1980, MRN [id number]  PCP:  No primary provider on file.      History of Present Illness: [name] [name] is a 32 y.o. female who returns for follow up on HTN.  I saw her in 2012 after a visit to the ED with high BP.  She was seen by Dr. Haroldine Laws in the ED. Medicines were adjusted.  A sleep test returned demonstrating severe sleep apnea. Renal dopplers were normal. Echo 06/17/11: mild LVH, EF 50-55%, mild LAE. She had not been seen since 06/2011.  She missed a couple appointments with pulmonology to treat her sleep apnea.  She returned recently to discuss management of BP in the setting of pregnancy.  She is in her first trimester.  BP has been markedly elevated.  I reviewed her case with Dr. Stanford Breed and the PharmD at [facility] and put her on Labetalol and Nifedipine.  She is compliant with her medications.  She sees Ob next week.  She denies chest pain, shortness of breath, syncope, orthopnea, PND or significant pedal edema.  No headache, blurry vision, unilateral weakness, slurred speech.    Labs (9/12):    TSH 2.615 Labs (11/12):  K 3.9, creatinine 0.6  Labs (3/14):    K 3.9, creatinine 0.6  Wt Readings from Last 3 Encounters:  11/10/12 380 lb 6.4 oz (172.548 kg)  11/02/12 380 lb (172.367 kg)  10/26/12 376 lb (170.552 kg)     Past Medical History  Diagnosis Date  . Sinus tachycardia   . Morbid obesity   . Hypertension     Current Outpatient Prescriptions  Medication Sig Dispense Refill  . labetalol (NORMODYNE) 200 MG tablet Take 2 tablets (400 mg total) by mouth 2 (two) times daily.      Marland Kitchen NIFEdipine (PROCARDIA XL/ADALAT-CC) 60 MG 24 hr tablet Take 1 tablet (60 mg total) by mouth daily.  30 tablet  6   No current facility-administered medications for this visit.    Allergies:   No Known Allergies  Social  History:  The patient  reports that she has never smoked. She does not have any smokeless tobacco history on file. She reports that she does not drink alcohol or use illicit drugs.   ROS:  Please see the history of present illness.   She notes nausea from pregnancy.   All other systems reviewed and negative.   PHYSICAL EXAM: VS:  BP 168/100  Pulse 80  Ht _0  (1.651 m)  Wt 380 lb 6.4 oz (172.548 kg)  BMI 63.3 kg/m2 Well nourished, well developed, in no acute distress HEENT: normal Neck: no JVD Cardiac:  normal S1, S2; RRR; no murmur Lungs:  clear to auscultation bilaterally, no wheezing, rhonchi or rales Abd: soft, nontender, no hepatomegaly Ext: no edema Skin: warm and dry Neuro:  CNs 2-12 intact, no focal abnormalities noted  EKG:  NSR, HR 95, normal axis, no change from prior tracing     ASSESSMENT AND PLAN:  1. Malignant Hypertension:  Marginal improvement.  Increase Nifedipine to 120 mg QD. 2. Sleep Apnea:  F/u with pulmonology.  We discussed the importance of treating OSA in treating HTN. 3. Obesity:  She is being referred to a dietician. 4. Pregnancy:  She is to see her Ob next week. 5. Disposition:  Follow up with Dr. Kirk Ruths 4/10 as planned.  Danton Sewer, PA-C  12:25 PM 11/10/2012

## 2012-11-16 ENCOUNTER — Institutional Professional Consult (permissible substitution): Payer: Self-pay | Admitting: Pulmonary Disease

## 2012-11-23 LAB — OB RESULTS CONSOLE ABO/RH: RH Type: POSITIVE

## 2012-11-23 LAB — OB RESULTS CONSOLE RPR: RPR: NONREACTIVE

## 2012-11-23 LAB — OB RESULTS CONSOLE GC/CHLAMYDIA
Chlamydia: NEGATIVE
Gonorrhea: NEGATIVE

## 2012-11-23 LAB — OB RESULTS CONSOLE HIV ANTIBODY (ROUTINE TESTING): HIV: NONREACTIVE

## 2012-11-23 LAB — OB RESULTS CONSOLE HEPATITIS B SURFACE ANTIGEN: Hepatitis B Surface Ag: NEGATIVE

## 2012-11-23 LAB — OB RESULTS CONSOLE RUBELLA ANTIBODY, IGM: Rubella: IMMUNE

## 2012-11-23 LAB — OB RESULTS CONSOLE ANTIBODY SCREEN: Antibody Screen: NEGATIVE

## 2012-11-30 ENCOUNTER — Ambulatory Visit (INDEPENDENT_AMBULATORY_CARE_PROVIDER_SITE_OTHER): Payer: PRIVATE HEALTH INSURANCE | Admitting: Cardiology

## 2012-11-30 ENCOUNTER — Encounter: Payer: Self-pay | Admitting: Cardiology

## 2012-11-30 VITALS — BP 142/84 | HR 88 | Ht 65.5 in | Wt 379.0 lb

## 2012-11-30 DIAGNOSIS — G4730 Sleep apnea, unspecified: Secondary | ICD-10-CM

## 2012-11-30 DIAGNOSIS — I10 Essential (primary) hypertension: Secondary | ICD-10-CM

## 2012-11-30 NOTE — Assessment & Plan Note (Signed)
Patient's blood pressure much improved. Continue present medications.

## 2012-11-30 NOTE — Progress Notes (Signed)
   HPI: [name] [name] is a 32 y.o. female who returns for follow up on HTN. Previously seen by Richardson Dopp and Dr Haroldine Laws for hypertension. Previous sleep study showed severe sleep apnea. Renal dopplers were normal. Echo 06/17/11: mild LVH, EF 50-55%, mild LAE. She had not been seen since 06/2011. Patient recently returned to see Richardson Dopp for hypertension and she is now pregnant. He placed her on labetalol and nifedipine. Since she was last seen, the patient denies any dyspnea on exertion, orthopnea, PND, pedal edema, palpitations, syncope or chest pain.    Current Outpatient Prescriptions  Medication Sig Dispense Refill  . labetalol (NORMODYNE) 200 MG tablet Take 2 tablets (400 mg total) by mouth 2 (two) times daily.      Marland Kitchen NIFEdipine (PROCARDIA XL/ADALAT-CC) 60 MG 24 hr tablet Take 2 tablets (120 mg total) by mouth daily.  60 tablet  6   No current facility-administered medications for this visit.     Past Medical History  Diagnosis Date  . Sinus tachycardia   . Morbid obesity   . Hypertension     History reviewed. No pertinent past surgical history.  History   Social History  . Marital Status: Single    Spouse Name: N/A    Number of Children: N/A  . Years of Education: N/A   Occupational History  . 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 Smoking status: Never Smoker   . Smokeless tobacco: Not on file  . Alcohol Use: No  . Drug Use: No  . Sexually Active: Not on file   Other Topics Concern  . 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 Remaining systems are negative.  Physical Exam: Well-developed morbidly obese in no acute distress.  Skin is warm and  dry.  HEENT is normal.  Neck is supple.  Chest is clear to auscultation with normal expansion.  Cardiovascular exam is regular rate and rhythm.  Abdominal exam nontender or distended. No masses palpated. Extremities show trace edema. neuro grossly intact

## 2012-11-30 NOTE — Assessment & Plan Note (Signed)
Patient counseled on weight loss.

## 2012-11-30 NOTE — Assessment & Plan Note (Signed)
Patient to be evaluated tomorrow by pulmonary to treat her sleep apnea.

## 2012-11-30 NOTE — Patient Instructions (Addendum)
Your physician wants you to follow-up in: [employer] will receive a reminder letter in the mail two months in advance. If you don't receive a letter, please call our office to schedule the follow-up appointment.

## 2012-12-01 ENCOUNTER — Institutional Professional Consult (permissible substitution): Payer: Self-pay | Admitting: Pulmonary Disease

## 2012-12-06 ENCOUNTER — Ambulatory Visit: Payer: PRIVATE HEALTH INSURANCE | Admitting: *Deleted

## 2012-12-25 ENCOUNTER — Institutional Professional Consult (permissible substitution): Payer: Self-pay | Admitting: Pulmonary Disease

## 2012-12-27 ENCOUNTER — Ambulatory Visit: Payer: PRIVATE HEALTH INSURANCE | Admitting: *Deleted

## 2013-01-11 ENCOUNTER — Other Ambulatory Visit: Payer: Self-pay

## 2013-01-12 ENCOUNTER — Other Ambulatory Visit (HOSPITAL_COMMUNITY): Payer: Self-pay | Admitting: Obstetrics and Gynecology

## 2013-01-12 DIAGNOSIS — O2692 Pregnancy related conditions, unspecified, second trimester: Secondary | ICD-10-CM

## 2013-01-12 DIAGNOSIS — Z3689 Encounter for other specified antenatal screening: Secondary | ICD-10-CM

## 2013-02-05 ENCOUNTER — Ambulatory Visit: Payer: PRIVATE HEALTH INSURANCE | Admitting: *Deleted

## 2013-02-07 ENCOUNTER — Telehealth: Payer: Self-pay | Admitting: *Deleted

## 2013-02-07 NOTE — Telephone Encounter (Signed)
pt notifed per Brynda Rim. PAC to let pt know since she did not go for her appt to [place], that for her to call them when she is ready to schedule that appt, pt said ok

## 2013-02-09 ENCOUNTER — Encounter (HOSPITAL_COMMUNITY): Payer: Self-pay

## 2013-02-09 ENCOUNTER — Ambulatory Visit (HOSPITAL_COMMUNITY)
Admission: RE | Admit: 2013-02-09 | Discharge: 2013-02-09 | Disposition: A | Discharge status: Home / Self Care | Payer: Medicaid Other | Source: Ambulatory Visit | Attending: Obstetrics and Gynecology | Admitting: Obstetrics and Gynecology

## 2013-02-09 DIAGNOSIS — E669 Obesity, unspecified: Secondary | ICD-10-CM | POA: Insufficient documentation

## 2013-02-09 DIAGNOSIS — Z3689 Encounter for other specified antenatal screening: Secondary | ICD-10-CM

## 2013-02-09 DIAGNOSIS — O358XX0 Maternal care for other (suspected) fetal abnormality and damage, not applicable or unspecified: Secondary | ICD-10-CM | POA: Insufficient documentation

## 2013-02-09 DIAGNOSIS — O2692 Pregnancy related conditions, unspecified, second trimester: Secondary | ICD-10-CM

## 2013-02-09 DIAGNOSIS — O99210 Obesity complicating pregnancy, unspecified trimester: Secondary | ICD-10-CM | POA: Insufficient documentation

## 2013-03-06 ENCOUNTER — Other Ambulatory Visit (HOSPITAL_COMMUNITY): Payer: Self-pay | Admitting: Obstetrics and Gynecology

## 2013-03-06 DIAGNOSIS — O99210 Obesity complicating pregnancy, unspecified trimester: Secondary | ICD-10-CM

## 2013-03-06 DIAGNOSIS — E669 Obesity, unspecified: Secondary | ICD-10-CM

## 2013-03-09 ENCOUNTER — Encounter (HOSPITAL_COMMUNITY): Payer: Self-pay

## 2013-03-09 ENCOUNTER — Ambulatory Visit (HOSPITAL_COMMUNITY)
Admission: RE | Admit: 2013-03-09 | Discharge: 2013-03-09 | Disposition: A | Discharge status: Home / Self Care | Payer: Medicaid Other | Source: Ambulatory Visit | Attending: Obstetrics and Gynecology | Admitting: Obstetrics and Gynecology

## 2013-03-09 DIAGNOSIS — Z3689 Encounter for other specified antenatal screening: Secondary | ICD-10-CM | POA: Insufficient documentation

## 2013-03-09 DIAGNOSIS — O99210 Obesity complicating pregnancy, unspecified trimester: Secondary | ICD-10-CM | POA: Insufficient documentation

## 2013-03-09 DIAGNOSIS — E669 Obesity, unspecified: Secondary | ICD-10-CM

## 2013-03-28 ENCOUNTER — Encounter: Payer: Medicaid Other | Attending: Obstetrics and Gynecology | Admitting: *Deleted

## 2013-04-02 ENCOUNTER — Encounter: Payer: Self-pay | Admitting: *Deleted

## 2013-04-02 NOTE — Patient Instructions (Signed)
Goals:  Check glucose levels per MD as instructed  Follow Gestational Diabetes Diet as instructed  Call for follow-up as needed

## 2013-04-02 NOTE — Progress Notes (Signed)
  Patient was seen on 03/28/13 for Gestational Diabetes self-management class at [facility]. The following learning objectives were met by the patient during this course:   States the definition of Gestational Diabetes  States why dietary management is important in controlling blood glucose  Describes the effects each nutrient has on blood glucose levels  Demonstrates ability to create a balanced meal plan  Demonstrates carbohydrate counting   States when to check blood glucose levels  Demonstrates proper blood glucose monitoring techniques  States the effect of stress and exercise on blood glucose levels  States the importance of limiting caffeine and abstaining from alcohol and smoking  Blood glucose monitor given: Accu Chek Nano BG Monitoring Kit  Lot # [id number] Exp: 05/22/14 Blood glucose reading: 101  Patient instructed to monitor glucose levels: FBS: 60 - <90 1 hour: <140 2 hour: <120  *Patient received handouts:  Nutrition Diabetes and Pregnancy  Carbohydrate Counting List  Patient will be seen for follow-up as needed.

## 2013-04-05 ENCOUNTER — Other Ambulatory Visit (HOSPITAL_COMMUNITY): Payer: Self-pay | Admitting: Obstetrics and Gynecology

## 2013-04-05 ENCOUNTER — Ambulatory Visit (HOSPITAL_COMMUNITY)
Admission: RE | Admit: 2013-04-05 | Discharge: 2013-04-05 | Disposition: A | Discharge status: Home / Self Care | Payer: Medicaid Other | Source: Ambulatory Visit | Attending: Obstetrics and Gynecology | Admitting: Obstetrics and Gynecology

## 2013-04-05 DIAGNOSIS — IMO0002 Reserved for concepts with insufficient information to code with codable children: Secondary | ICD-10-CM

## 2013-04-05 DIAGNOSIS — O10019 Pre-existing essential hypertension complicating pregnancy, unspecified trimester: Secondary | ICD-10-CM | POA: Insufficient documentation

## 2013-04-05 DIAGNOSIS — E669 Obesity, unspecified: Secondary | ICD-10-CM | POA: Insufficient documentation

## 2013-04-05 DIAGNOSIS — O99210 Obesity complicating pregnancy, unspecified trimester: Secondary | ICD-10-CM

## 2013-04-06 ENCOUNTER — Ambulatory Visit (HOSPITAL_COMMUNITY)
Admission: RE | Admit: 2013-04-06 | Discharge: 2013-04-06 | Disposition: A | Discharge status: Home / Self Care | Payer: Medicaid Other | Source: Ambulatory Visit | Attending: Obstetrics and Gynecology | Admitting: Obstetrics and Gynecology

## 2013-04-06 DIAGNOSIS — O99210 Obesity complicating pregnancy, unspecified trimester: Secondary | ICD-10-CM

## 2013-04-06 DIAGNOSIS — E669 Obesity, unspecified: Secondary | ICD-10-CM | POA: Insufficient documentation

## 2013-04-06 DIAGNOSIS — O10019 Pre-existing essential hypertension complicating pregnancy, unspecified trimester: Secondary | ICD-10-CM | POA: Insufficient documentation

## 2013-04-17 ENCOUNTER — Other Ambulatory Visit: Payer: Self-pay | Admitting: Physician Assistant

## 2013-04-25 ENCOUNTER — Other Ambulatory Visit (HOSPITAL_COMMUNITY): Payer: Self-pay | Admitting: Obstetrics and Gynecology

## 2013-04-25 ENCOUNTER — Ambulatory Visit (HOSPITAL_COMMUNITY)
Admission: RE | Admit: 2013-04-25 | Discharge: 2013-04-25 | Disposition: A | Discharge status: Home / Self Care | Payer: Medicaid Other | Source: Ambulatory Visit | Attending: Obstetrics and Gynecology | Admitting: Obstetrics and Gynecology

## 2013-04-25 DIAGNOSIS — O99210 Obesity complicating pregnancy, unspecified trimester: Secondary | ICD-10-CM | POA: Insufficient documentation

## 2013-04-25 DIAGNOSIS — O24419 Gestational diabetes mellitus in pregnancy, unspecified control: Secondary | ICD-10-CM

## 2013-04-25 DIAGNOSIS — I10 Essential (primary) hypertension: Secondary | ICD-10-CM

## 2013-04-25 DIAGNOSIS — E669 Obesity, unspecified: Secondary | ICD-10-CM | POA: Insufficient documentation

## 2013-04-25 DIAGNOSIS — O10019 Pre-existing essential hypertension complicating pregnancy, unspecified trimester: Secondary | ICD-10-CM | POA: Insufficient documentation

## 2013-04-25 DIAGNOSIS — O99810 Abnormal glucose complicating pregnancy: Secondary | ICD-10-CM | POA: Insufficient documentation

## 2013-04-25 NOTE — Progress Notes (Signed)
[name] [name]  was seen today for an ultrasound appointment.  See full report in [facility].  Impression: Single IUP at 34 3/7 weeks Chronic hypertension on Labetolol and Procardia, A1 GDM, obesity Active fetus with BPP of 8/8 Normal amniotic fluid volume  Recommendations: Continue 2x weekly NSTs with weekly AFIs.  [name], MD

## 2013-04-26 ENCOUNTER — Other Ambulatory Visit (HOSPITAL_COMMUNITY): Payer: Self-pay | Admitting: Obstetrics and Gynecology

## 2013-04-26 DIAGNOSIS — O99810 Abnormal glucose complicating pregnancy: Secondary | ICD-10-CM

## 2013-04-26 DIAGNOSIS — E669 Obesity, unspecified: Secondary | ICD-10-CM

## 2013-04-26 DIAGNOSIS — O99210 Obesity complicating pregnancy, unspecified trimester: Secondary | ICD-10-CM

## 2013-04-26 DIAGNOSIS — O10013 Pre-existing essential hypertension complicating pregnancy, third trimester: Secondary | ICD-10-CM

## 2013-05-04 ENCOUNTER — Ambulatory Visit (HOSPITAL_COMMUNITY)
Admission: RE | Admit: 2013-05-04 | Discharge: 2013-05-04 | Disposition: A | Discharge status: Home / Self Care | Payer: Medicaid Other | Source: Ambulatory Visit | Attending: Obstetrics and Gynecology | Admitting: Obstetrics and Gynecology

## 2013-05-04 ENCOUNTER — Other Ambulatory Visit (HOSPITAL_COMMUNITY): Payer: Self-pay | Admitting: Obstetrics and Gynecology

## 2013-05-04 DIAGNOSIS — O99210 Obesity complicating pregnancy, unspecified trimester: Secondary | ICD-10-CM

## 2013-05-04 DIAGNOSIS — E669 Obesity, unspecified: Secondary | ICD-10-CM

## 2013-05-04 DIAGNOSIS — O10019 Pre-existing essential hypertension complicating pregnancy, unspecified trimester: Secondary | ICD-10-CM | POA: Insufficient documentation

## 2013-05-04 DIAGNOSIS — O10013 Pre-existing essential hypertension complicating pregnancy, third trimester: Secondary | ICD-10-CM

## 2013-05-04 DIAGNOSIS — O99810 Abnormal glucose complicating pregnancy: Secondary | ICD-10-CM

## 2013-05-04 LAB — OB RESULTS CONSOLE GBS: GBS: POSITIVE

## 2013-05-04 NOTE — Progress Notes (Signed)
[name] [name] was seen for ultrasound appointment today.  Please see [facility] report for details.

## 2013-05-08 ENCOUNTER — Other Ambulatory Visit (HOSPITAL_COMMUNITY): Payer: Self-pay | Admitting: Obstetrics and Gynecology

## 2013-05-08 DIAGNOSIS — I10 Essential (primary) hypertension: Secondary | ICD-10-CM

## 2013-05-09 ENCOUNTER — Ambulatory Visit (HOSPITAL_COMMUNITY)
Admission: RE | Admit: 2013-05-09 | Discharge: 2013-05-09 | Disposition: A | Discharge status: Home / Self Care | Payer: Medicaid Other | Source: Ambulatory Visit | Attending: Obstetrics and Gynecology | Admitting: Obstetrics and Gynecology

## 2013-05-09 VITALS — BP 156/95 | HR 104 | Wt 378.5 lb

## 2013-05-09 DIAGNOSIS — O99210 Obesity complicating pregnancy, unspecified trimester: Secondary | ICD-10-CM | POA: Insufficient documentation

## 2013-05-09 DIAGNOSIS — E669 Obesity, unspecified: Secondary | ICD-10-CM | POA: Insufficient documentation

## 2013-05-09 DIAGNOSIS — I10 Essential (primary) hypertension: Secondary | ICD-10-CM

## 2013-05-09 DIAGNOSIS — O10019 Pre-existing essential hypertension complicating pregnancy, unspecified trimester: Secondary | ICD-10-CM | POA: Insufficient documentation

## 2013-05-09 DIAGNOSIS — O99810 Abnormal glucose complicating pregnancy: Secondary | ICD-10-CM | POA: Insufficient documentation

## 2013-05-09 NOTE — Progress Notes (Signed)
[name] [name]  was seen today for an ultrasound appointment.  See full report in [facility].  Impression: Single IUP at 36 3/7 weeks Chronic hypertension on Labetolol and Procardia, A1 GDM, obesity Active fetus with BPP of 8/8 Normal amniotic fluid volume  BPs: 156/95, repeat 135/96  Recommendations: Continue 2x weekly testing - will return for BPP next week.  Discussed with Dr. Marvel Plan - will go directly to their clinic for preelcampsia labs and serial blood pressure checks.  [name] Lobe, MD

## 2013-05-10 ENCOUNTER — Other Ambulatory Visit (HOSPITAL_COMMUNITY): Payer: Self-pay | Admitting: Obstetrics and Gynecology

## 2013-05-10 DIAGNOSIS — O10019 Pre-existing essential hypertension complicating pregnancy, unspecified trimester: Secondary | ICD-10-CM

## 2013-05-10 DIAGNOSIS — O99210 Obesity complicating pregnancy, unspecified trimester: Secondary | ICD-10-CM

## 2013-05-10 DIAGNOSIS — E669 Obesity, unspecified: Secondary | ICD-10-CM

## 2013-05-10 DIAGNOSIS — O99810 Abnormal glucose complicating pregnancy: Secondary | ICD-10-CM

## 2013-05-16 ENCOUNTER — Ambulatory Visit (HOSPITAL_COMMUNITY)
Admission: RE | Admit: 2013-05-16 | Discharge: 2013-05-16 | Disposition: A | Discharge status: Home / Self Care | Payer: Medicaid Other | Source: Ambulatory Visit

## 2013-05-16 ENCOUNTER — Ambulatory Visit (HOSPITAL_COMMUNITY)
Admission: RE | Admit: 2013-05-16 | Discharge: 2013-05-16 | Disposition: A | Discharge status: Home / Self Care | Payer: Medicaid Other | Source: Ambulatory Visit | Attending: Obstetrics and Gynecology | Admitting: Obstetrics and Gynecology

## 2013-05-16 DIAGNOSIS — O10019 Pre-existing essential hypertension complicating pregnancy, unspecified trimester: Secondary | ICD-10-CM | POA: Insufficient documentation

## 2013-05-16 DIAGNOSIS — O99810 Abnormal glucose complicating pregnancy: Secondary | ICD-10-CM | POA: Insufficient documentation

## 2013-05-16 DIAGNOSIS — E669 Obesity, unspecified: Secondary | ICD-10-CM | POA: Insufficient documentation

## 2013-05-16 DIAGNOSIS — O99210 Obesity complicating pregnancy, unspecified trimester: Secondary | ICD-10-CM

## 2013-05-17 ENCOUNTER — Other Ambulatory Visit (HOSPITAL_COMMUNITY): Payer: Self-pay | Admitting: Obstetrics and Gynecology

## 2013-05-17 ENCOUNTER — Ambulatory Visit (HOSPITAL_COMMUNITY)
Admission: RE | Admit: 2013-05-17 | Discharge: 2013-05-17 | Disposition: A | Discharge status: Home / Self Care | Payer: Medicaid Other | Source: Ambulatory Visit | Attending: Obstetrics and Gynecology | Admitting: Obstetrics and Gynecology

## 2013-05-17 DIAGNOSIS — O288 Other abnormal findings on antenatal screening of mother: Secondary | ICD-10-CM

## 2013-05-17 DIAGNOSIS — O99210 Obesity complicating pregnancy, unspecified trimester: Secondary | ICD-10-CM | POA: Insufficient documentation

## 2013-05-17 DIAGNOSIS — O10019 Pre-existing essential hypertension complicating pregnancy, unspecified trimester: Secondary | ICD-10-CM | POA: Insufficient documentation

## 2013-05-17 DIAGNOSIS — O289 Unspecified abnormal findings on antenatal screening of mother: Secondary | ICD-10-CM | POA: Insufficient documentation

## 2013-05-17 DIAGNOSIS — E669 Obesity, unspecified: Secondary | ICD-10-CM | POA: Insufficient documentation

## 2013-05-17 DIAGNOSIS — O99810 Abnormal glucose complicating pregnancy: Secondary | ICD-10-CM | POA: Insufficient documentation

## 2013-05-17 NOTE — Progress Notes (Signed)
[name] [name]  was seen today for an ultrasound appointment.  See full report in [facility].  Impression: Single IUP at 37 4/7 weeks Chronic hypertension on Labetolol and Procardia, A1 GDM, obesity BPP today 8/10 (-2 for non-reactive but reassuring NST) Normal amniotic fluid volume (AFI 9 cm)  Patient had preeclampsia labs drawn last week that were reported to be normal.  Recommendations: Recommend continued 2x weekly NSTs with weekly AFIs. If testing remains reassuring and BPs well controlled, recommend delivery by 39 weeks due to chronic hypertension on medications.  [name], MD

## 2013-05-22 ENCOUNTER — Other Ambulatory Visit (HOSPITAL_COMMUNITY): Payer: Self-pay | Admitting: Obstetrics and Gynecology

## 2013-05-22 DIAGNOSIS — O169 Unspecified maternal hypertension, unspecified trimester: Secondary | ICD-10-CM

## 2013-05-24 ENCOUNTER — Ambulatory Visit (HOSPITAL_COMMUNITY)
Admission: RE | Admit: 2013-05-24 | Discharge: 2013-05-24 | Disposition: A | Discharge status: Home / Self Care | Payer: Medicaid Other | Source: Ambulatory Visit | Attending: Obstetrics and Gynecology | Admitting: Obstetrics and Gynecology

## 2013-05-24 ENCOUNTER — Inpatient Hospital Stay (HOSPITAL_COMMUNITY)
Admission: AD | Admit: 2013-05-24 | Discharge: 2013-05-28 | DRG: 774 | Disposition: A | Discharge status: Home / Self Care | Payer: Medicaid Other | Source: Ambulatory Visit | Attending: Obstetrics and Gynecology | Admitting: Obstetrics and Gynecology

## 2013-05-24 ENCOUNTER — Encounter (HOSPITAL_COMMUNITY): Payer: Self-pay | Admitting: *Deleted

## 2013-05-24 ENCOUNTER — Encounter (HOSPITAL_COMMUNITY): Payer: Self-pay | Admitting: Anesthesiology

## 2013-05-24 DIAGNOSIS — E669 Obesity, unspecified: Secondary | ICD-10-CM | POA: Insufficient documentation

## 2013-05-24 DIAGNOSIS — O10019 Pre-existing essential hypertension complicating pregnancy, unspecified trimester: Secondary | ICD-10-CM | POA: Insufficient documentation

## 2013-05-24 DIAGNOSIS — O99814 Abnormal glucose complicating childbirth: Secondary | ICD-10-CM | POA: Diagnosis present

## 2013-05-24 DIAGNOSIS — O99210 Obesity complicating pregnancy, unspecified trimester: Secondary | ICD-10-CM | POA: Insufficient documentation

## 2013-05-24 DIAGNOSIS — O99810 Abnormal glucose complicating pregnancy: Secondary | ICD-10-CM | POA: Insufficient documentation

## 2013-05-24 DIAGNOSIS — O10919 Unspecified pre-existing hypertension complicating pregnancy, unspecified trimester: Secondary | ICD-10-CM

## 2013-05-24 DIAGNOSIS — O99892 Other specified diseases and conditions complicating childbirth: Secondary | ICD-10-CM | POA: Diagnosis present

## 2013-05-24 DIAGNOSIS — O24419 Gestational diabetes mellitus in pregnancy, unspecified control: Secondary | ICD-10-CM

## 2013-05-24 DIAGNOSIS — O1002 Pre-existing essential hypertension complicating childbirth: Secondary | ICD-10-CM | POA: Diagnosis present

## 2013-05-24 DIAGNOSIS — O4100X0 Oligohydramnios, unspecified trimester, not applicable or unspecified: Principal | ICD-10-CM | POA: Diagnosis present

## 2013-05-24 DIAGNOSIS — O10911 Unspecified pre-existing hypertension complicating pregnancy, first trimester: Secondary | ICD-10-CM

## 2013-05-24 DIAGNOSIS — O169 Unspecified maternal hypertension, unspecified trimester: Secondary | ICD-10-CM

## 2013-05-24 DIAGNOSIS — IMO0001 Reserved for inherently not codable concepts without codable children: Secondary | ICD-10-CM

## 2013-05-24 DIAGNOSIS — Z22330 Carrier of Group B streptococcus: Secondary | ICD-10-CM

## 2013-05-24 HISTORY — DX: Gestational diabetes mellitus in pregnancy, unspecified control: O24.419

## 2013-05-24 HISTORY — DX: Unspecified pre-existing hypertension complicating pregnancy, unspecified trimester: O10.919

## 2013-05-24 LAB — CBC
HCT: 37.5 % (ref 36.0–46.0)
Hemoglobin: 12.5 g/dL (ref 12.0–15.0)
MCH: 28.1 pg (ref 26.0–34.0)
MCHC: 33.3 g/dL (ref 30.0–36.0)
MCV: 84.3 fL (ref 78.0–100.0)
Platelets: 274 10*3/uL (ref 150–400)
RBC: 4.45 MIL/uL (ref 3.87–5.11)
RDW: 14.4 % (ref 11.5–15.5)
WBC: 6.5 10*3/uL (ref 4.0–10.5)

## 2013-05-24 LAB — ABO/RH: ABO/RH(D): A POS

## 2013-05-24 LAB — TYPE AND SCREEN
ABO/RH(D): A POS
Antibody Screen: NEGATIVE

## 2013-05-24 LAB — GLUCOSE, CAPILLARY
Glucose-Capillary: 106 mg/dL — ABNORMAL HIGH (ref 70–99)
Glucose-Capillary: 129 mg/dL — ABNORMAL HIGH (ref 70–99)
Glucose-Capillary: 115 mg/dL — ABNORMAL HIGH (ref 70–99)
Glucose-Capillary: 113 mg/dL — ABNORMAL HIGH (ref 70–99)

## 2013-05-24 LAB — RPR: RPR Ser Ql: NONREACTIVE

## 2013-05-24 MED ORDER — ACETAMINOPHEN 325 MG PO TABS: 650.0000 mg | ORAL_TABLET | ORAL | Status: DC | PRN

## 2013-05-24 MED ORDER — LACTATED RINGERS IV SOLN
500.0000 mL | Freq: Once | INTRAVENOUS | Status: AC
  Administered 2013-05-25: 500 mL via INTRAVENOUS

## 2013-05-24 MED ORDER — OXYCODONE-ACETAMINOPHEN 5-325 MG PO TABS: 1.0000 | ORAL_TABLET | ORAL | Status: DC | PRN

## 2013-05-24 MED ORDER — EPHEDRINE 5 MG/ML INJ
10.0000 mg | INTRAVENOUS | Status: DC | PRN
  Filled 2013-05-24: qty 2

## 2013-05-24 MED ORDER — MISOPROSTOL 25 MCG QUARTER TABLET
25.0000 ug | ORAL_TABLET | ORAL | Status: DC | PRN
  Administered 2013-05-24 – 2013-05-25 (×3): 25 ug via VAGINAL
  Filled 2013-05-24 (×4): qty 0.25

## 2013-05-24 MED ORDER — LIDOCAINE HCL (PF) 1 % IJ SOLN
30.0000 mL | INTRAMUSCULAR | Status: AC | PRN
  Administered 2013-05-26: 30 mL via SUBCUTANEOUS
  Filled 2013-05-24 (×2): qty 30

## 2013-05-24 MED ORDER — LACTATED RINGERS IV SOLN
INTRAVENOUS | Status: DC
  Administered 2013-05-24: 18:00:00 via INTRAVENOUS
  Administered 2013-05-25: 125 mL/h via INTRAVENOUS
  Administered 2013-05-25 – 2013-05-26 (×2): via INTRAVENOUS

## 2013-05-24 MED ORDER — OXYTOCIN BOLUS FROM INFUSION
500.0000 mL | INTRAVENOUS | Status: DC
  Administered 2013-05-26: 500 mL via INTRAVENOUS

## 2013-05-24 MED ORDER — PENICILLIN G POTASSIUM 5000000 UNITS IJ SOLR
2.5000 10*6.[IU] | INTRAVENOUS | Status: DC
  Administered 2013-05-24 – 2013-05-26 (×8): 2.5 10*6.[IU] via INTRAVENOUS
  Filled 2013-05-24 (×11): qty 2.5

## 2013-05-24 MED ORDER — EPHEDRINE 5 MG/ML INJ
10.0000 mg | INTRAVENOUS | Status: DC | PRN
  Filled 2013-05-24: qty 4
  Filled 2013-05-24: qty 2

## 2013-05-24 MED ORDER — PHENYLEPHRINE 40 MCG/ML (10ML) SYRINGE FOR IV PUSH (FOR BLOOD PRESSURE SUPPORT)
80.0000 ug | PREFILLED_SYRINGE | INTRAVENOUS | Status: DC | PRN
  Filled 2013-05-24: qty 5
  Filled 2013-05-24: qty 2

## 2013-05-24 MED ORDER — CITRIC ACID-SODIUM CITRATE 334-500 MG/5ML PO SOLN: 30.0000 mL | ORAL | Status: DC | PRN

## 2013-05-24 MED ORDER — PHENYLEPHRINE 40 MCG/ML (10ML) SYRINGE FOR IV PUSH (FOR BLOOD PRESSURE SUPPORT)
80.0000 ug | PREFILLED_SYRINGE | INTRAVENOUS | Status: DC | PRN
  Filled 2013-05-24: qty 2

## 2013-05-24 MED ORDER — TERBUTALINE SULFATE 1 MG/ML IJ SOLN: 0.2500 mg | Freq: Once | INTRAMUSCULAR | Status: AC | PRN

## 2013-05-24 MED ORDER — FENTANYL 2.5 MCG/ML BUPIVACAINE 1/10 % EPIDURAL INFUSION (WH - ANES)
14.0000 mL/h | INTRAMUSCULAR | Status: DC | PRN
  Filled 2013-05-24 (×3): qty 125

## 2013-05-24 MED ORDER — PENICILLIN G POTASSIUM 5000000 UNITS IJ SOLR
5.0000 10*6.[IU] | Freq: Once | INTRAVENOUS | Status: AC
  Administered 2013-05-24: 5 10*6.[IU] via INTRAVENOUS
  Filled 2013-05-24: qty 5

## 2013-05-24 MED ORDER — IBUPROFEN 600 MG PO TABS: 600.0000 mg | ORAL_TABLET | Freq: Four times a day (QID) | ORAL | Status: DC | PRN

## 2013-05-24 MED ORDER — LABETALOL HCL 200 MG PO TABS
400.0000 mg | ORAL_TABLET | Freq: Two times a day (BID) | ORAL | Status: DC
  Administered 2013-05-24 – 2013-05-25 (×3): 400 mg via ORAL
  Filled 2013-05-24 (×4): qty 2

## 2013-05-24 MED ORDER — OXYTOCIN 40 UNITS IN LACTATED RINGERS INFUSION - SIMPLE MED
62.5000 mL/h | INTRAVENOUS | Status: DC
  Administered 2013-05-26: 62.5 mL/h via INTRAVENOUS
  Filled 2013-05-24 (×2): qty 1000

## 2013-05-24 MED ORDER — LACTATED RINGERS IV SOLN
500.0000 mL | INTRAVENOUS | Status: DC | PRN
  Administered 2013-05-25: 300 mL via INTRAVENOUS

## 2013-05-24 MED ORDER — NIFEDIPINE ER 60 MG PO TB24
120.0000 mg | ORAL_TABLET | Freq: Every day | ORAL | Status: DC
  Administered 2013-05-25: 120 mg via ORAL
  Filled 2013-05-24 (×2): qty 2

## 2013-05-24 MED ORDER — ONDANSETRON HCL 4 MG/2ML IJ SOLN: 4.0000 mg | Freq: Four times a day (QID) | INTRAMUSCULAR | Status: DC | PRN

## 2013-05-24 MED ORDER — DIPHENHYDRAMINE HCL 50 MG/ML IJ SOLN: 12.5000 mg | INTRAMUSCULAR | Status: DC | PRN

## 2013-05-24 NOTE — H&P (Addendum)
[name] [name] is a 32 y.o. female G20P0020 at 38+ with morbid obesity, HTN for IOL given HTN and oligohydramnios.  CHTN - On labetalol 451m bid, Nifedipine XL 120qd.  Also GDM had prescribed glyburide 2.517mpo daily, pt hasn't started.  Sugars aren't very well controlled.  Also Late entry to care.  D/w pt IOL given not favorable cervix and length of labor, etc.  D/w pt likely need for vertical incision if necessary to proceed with cesarean section. Pt had weekly BPP scheduled 10/2, noted to have decreased fluid, MFM, Dr. DeBurnett Harryrecommend iol.  Maternal Medical History:  Contractions: Frequency: irregular.    Fetal activity: Perceived fetal activity is normal.    Prenatal complications: PIH and oligohydramnios.   Prenatal Complications - Diabetes: gestational. Diabetes is managed by diet.      OB History   Grav Para Term Preterm Abortions TAB SAB Ect Mult Living   _0    G3P0020 SAB, TAB G3 present - HTN, GDM  Past Medical History  Diagnosis Date  . Sinus tachycardia   . Morbid obesity   . Hypertension   . Diabetes mellitus without complication     GDM   PSH none Family History: family history includes Diabetes in her other; Hypertension in her mother. Social History:  reports that she has never smoked. She does not have any smokeless tobacco history on file. She reports that she does not drink alcohol or use illicit drugs.single, works at [employer] Meds (glyburide) labetalol, nifedipine XL   Prenatal Transfer Tool  Maternal Diabetes: Yes:  Diabetes Type:  Diet controlled Genetic Screening: Declined Maternal Ultrasounds/Referrals: Abnormal:  Findings:   Other:oligo Fetal Ultrasounds or other Referrals:  Referred to Materal Fetal Medicine  Maternal Substance Abuse:  No Significant Maternal Medications:  Meds include: Other: Labetalol, Nifedipine Significant Maternal Lab Results:  Lab values include: Group B Strep positive Other Comments:   supposed to start glyburide, didn't  Review of Systems  Constitutional: Negative.   HENT: Negative.   Eyes: Negative.   Respiratory: Negative.   Cardiovascular: Negative.   Gastrointestinal: Negative.   Genitourinary: Negative.   Musculoskeletal: Negative.   Skin: Negative.   Neurological: Negative.   Psychiatric/Behavioral: Negative.     Dilation: Closed Exam by:: bovard Blood pressure 127/77, pulse 98, temperature 98.2 F (36.8 C), temperature source Oral, resp. rate 20, height _1  (1.651 m), weight 167.831 kg (370 lb), last menstrual period 08/27/2012. Maternal Exam:  Abdomen: Fundal height is diffiicult to assess.   Estimated fetal weight is 6-7#.   Fetal presentation: vertex  Introitus: Normal vulva. Normal vagina.  Pelvis: adequate for delivery.   Cervix: Cervix evaluated by digital exam.     Physical Exam  Constitutional: She is oriented to person, place, and time. She appears well-developed and well-nourished.  Obese  HENT:  Head: Normocephalic and atraumatic.  Cardiovascular: Normal rate and regular rhythm.   Respiratory: Effort normal and breath sounds normal. No respiratory distress.  GI: Soft. Bowel sounds are normal. There is no tenderness.  Musculoskeletal: Normal range of motion.  Neurological: She is alert and oriented to person, place, and time.  Skin: Skin is warm and dry.  Psychiatric: She has a normal mood and affect. Her behavior is normal.    Prenatal labs: ABO, Rh: A/Positive/-- (04/03 0000) Antibody: Negative (04/03 0000) Rubella: Immune (04/03 0000) RPR: Nonreactive (04/03 0000)  HBsAg: Negative (04/03 0000)  HIV: Non-reactive (04/03 0000)  GBS: Positive (09/12 0000)   Hgb 12.4/Ur Cx neg/ Hgb electro WNL/ GC neg/ Chl neg/ Plt 261K/ glucola 197/ 3 hr GTT **109, **191, **169, 118  [place] ant plac, EDC 06/03/13 ( LMP cw 11 wk [place]), nl anat, ant plac Followed for growth,  Also weekly BPP  05/24/13 with oligo on BPP - MFM recc  IOL  Assessment/Plan: 31yo G3P0020 at 38+ for IOL cytotec for ripening cl/th/high gbbs + - PCN for prophylaxis Monitor FSBS closely Continue labetalol and nifedipine for BP   BOVARD,Jailen Coward 05/24/2013, 6:27 PM

## 2013-05-24 NOTE — Progress Notes (Signed)
Patient ID: [name] [name], female   DOB: [birth year], 32 y.o.   MRN: [id number]  Doing well.  +FM, no LOF, no VB, occ ctx  AFVSS gen NAD FHTs 140-150  Min var toco occ  S/p cytotec #2 for ripening Cl/th/high  Continue close monitoring Cont cytotec induction monitor FSBS closely D/w pt POC

## 2013-05-24 NOTE — Progress Notes (Signed)
Dr Melba Coon updated on FHR.

## 2013-05-24 NOTE — Progress Notes (Signed)
Monitors taken off , beacon monitors applied

## 2013-05-24 NOTE — Anesthesia Preprocedure Evaluation (Deleted)
Anesthesia Evaluation  Patient identified by MRN, date of birth, ID band Patient awake    Reviewed: Allergy & Precautions, H&P , NPO status , Patient's Chart, lab work & pertinent test results, reviewed documented beta blocker date and time   History of Anesthesia Complications Negative for: history of anesthetic complications  Airway Mallampati: I TM Distance: >3 FB Neck ROM: full    Dental  (+) Teeth Intact   Pulmonary sleep apnea , Recent URI , Residual Cough,  breath sounds clear to auscultation        Cardiovascular hypertension (CHTN), On Home Beta Blockers + dysrhythmias (sinus tach) Rhythm:regular Rate:Normal     Neuro/Psych negative neurological ROS  negative psych ROS   GI/Hepatic negative GI ROS, Neg liver ROS,   Endo/Other  diabetes (noncompliant with oral meds), Gestational, Oral Hypoglycemic AgentsMorbid obesity  Renal/GU negative Renal ROS     Musculoskeletal   Abdominal   Peds  Hematology negative hematology ROS (+)   Anesthesia Other Findings   Reproductive/Obstetrics (+) Pregnancy                          Anesthesia Physical Anesthesia Plan  ASA: III  Anesthesia Plan: Epidural   Post-op Pain Management:    Induction:   Airway Management Planned:   Additional Equipment:   Intra-op Plan:   Post-operative Plan:   Informed Consent: I have reviewed the patients History and Physical, chart, labs and discussed the procedure including the risks, benefits and alternatives for the proposed anesthesia with the patient or authorized representative who has indicated his/her understanding and acceptance.     Plan Discussed with: Surgeon and CRNA  Anesthesia Plan Comments:         Anesthesia Quick Evaluation

## 2013-05-25 ENCOUNTER — Inpatient Hospital Stay (HOSPITAL_COMMUNITY): Payer: Medicaid Other | Admitting: Anesthesiology

## 2013-05-25 ENCOUNTER — Encounter (HOSPITAL_COMMUNITY): Payer: Self-pay | Admitting: Anesthesiology

## 2013-05-25 LAB — COMPREHENSIVE METABOLIC PANEL
ALT: 12 U/L (ref 0–35)
AST: 16 U/L (ref 0–37)
Albumin: 2.7 g/dL — ABNORMAL LOW (ref 3.5–5.2)
Alkaline Phosphatase: 105 U/L (ref 39–117)
BUN: 8 mg/dL (ref 6–23)
CO2: 22 mEq/L (ref 19–32)
Calcium: 9.8 mg/dL (ref 8.4–10.5)
Chloride: 96 mEq/L (ref 96–112)
Creatinine, Ser: 0.6 mg/dL (ref 0.50–1.10)
GFR calc Af Amer: >90 mL/min (ref >90)
GFR calc non Af Amer: >90 mL/min (ref >90)
Glucose, Bld: 112 mg/dL — ABNORMAL HIGH (ref 70–99)
Potassium: 3.9 mEq/L (ref 3.5–5.1)
Sodium: 131 mEq/L — ABNORMAL LOW (ref 135–145)
Total Bilirubin: 0.3 mg/dL (ref 0.3–1.2)
Total Protein: 7.4 g/dL (ref 6.0–8.3)

## 2013-05-25 LAB — GLUCOSE, CAPILLARY
Glucose-Capillary: 103 mg/dL — ABNORMAL HIGH (ref 70–99)
Glucose-Capillary: 105 mg/dL — ABNORMAL HIGH (ref 70–99)
Glucose-Capillary: 109 mg/dL — ABNORMAL HIGH (ref 70–99)
Glucose-Capillary: 113 mg/dL — ABNORMAL HIGH (ref 70–99)
Glucose-Capillary: 89 mg/dL (ref 70–99)
Glucose-Capillary: 93 mg/dL (ref 70–99)
Glucose-Capillary: 96 mg/dL (ref 70–99)

## 2013-05-25 LAB — CBC
HCT: 36.4 % (ref 36.0–46.0)
Hemoglobin: 11.9 g/dL — ABNORMAL LOW (ref 12.0–15.0)
MCH: 27.4 pg (ref 26.0–34.0)
MCHC: 32.7 g/dL (ref 30.0–36.0)
MCV: 83.9 fL (ref 78.0–100.0)
Platelets: 232 10*3/uL (ref 150–400)
RBC: 4.34 MIL/uL (ref 3.87–5.11)
RDW: 14.4 % (ref 11.5–15.5)
WBC: 5.8 10*3/uL (ref 4.0–10.5)

## 2013-05-25 LAB — URIC ACID: Uric Acid, Serum: 6.4 mg/dL (ref 2.4–7.0)

## 2013-05-25 MED ORDER — BUPIVACAINE HCL (PF) 0.25 % IJ SOLN
INTRAMUSCULAR | Status: DC | PRN
  Administered 2013-05-25: 10 mL
  Administered 2013-05-26 (×2): 5 mL

## 2013-05-25 MED ORDER — FENTANYL 2.5 MCG/ML BUPIVACAINE 1/10 % EPIDURAL INFUSION (WH - ANES)
INTRAMUSCULAR | Status: DC | PRN
  Administered 2013-05-25: 14 mL/h via EPIDURAL
  Administered 2013-05-25 – 2013-05-26 (×2)

## 2013-05-25 MED ORDER — LIDOCAINE HCL (PF) 1 % IJ SOLN
INTRAMUSCULAR | Status: DC | PRN
  Administered 2013-05-25: 7 mL
  Administered 2013-05-25: 8 mL

## 2013-05-25 MED ORDER — OXYTOCIN 40 UNITS IN LACTATED RINGERS INFUSION - SIMPLE MED
1.0000 m[IU]/min | INTRAVENOUS | Status: DC
  Administered 2013-05-25: 2 m[IU]/min via INTRAVENOUS

## 2013-05-25 MED ORDER — BUTORPHANOL TARTRATE 1 MG/ML IJ SOLN
1.0000 mg | Freq: Once | INTRAMUSCULAR | Status: AC
  Administered 2013-05-25: 1 mg via INTRAVENOUS
  Filled 2013-05-25: qty 1

## 2013-05-25 MED ORDER — LABETALOL HCL 5 MG/ML IV SOLN
20.0000 mg | Freq: Once | INTRAVENOUS | Status: AC
  Administered 2013-05-25: 20 mg via INTRAVENOUS
  Filled 2013-05-25: qty 4

## 2013-05-25 NOTE — Progress Notes (Signed)
Patient ID: [name] [name], female   DOB: 1981/05/17, 32 y.o.   MRN: [id number] Pt got very uncomfortable and received epidural, now feels better afeb vss FHR with variability, occasional variable decels, contractions not tracing  Cervix 2/80/-1  FSE and IUPC placed given difficulty monitoring with pt body habitus Will follow progress and adjust pitocin as needed.

## 2013-05-25 NOTE — Anesthesia Procedure Notes (Signed)
Epidural Patient location during procedure: OB Start time: 05/25/2013 12:45 PM End time: 05/25/2013 12:55 PM  Staffing Anesthesiologist: Laymond Purser Performed by: anesthesiologist   Preanesthetic Checklist Completed: patient identified, surgical consent, pre-op evaluation, timeout performed, IV checked, risks and benefits discussed and monitors and equipment checked  Epidural Patient position: sitting Prep: site prepped and draped and DuraPrep Patient monitoring: continuous pulse ox and blood pressure Approach: midline Injection technique: LOR air  Needle:  Needle type: Tuohy  Needle gauge: 17 G Needle length: 9 cm and 9 Needle insertion depth: 9 cm Catheter type: closed end flexible Catheter size: 19 Gauge Catheter at skin depth: 15 cm Test dose: negative and Other  Assessment Sensory level: T10 Events: blood not aspirated, injection not painful, no injection resistance, negative IV test and no paresthesia  Additional Notes Reason for block:procedure for pain

## 2013-05-25 NOTE — Progress Notes (Signed)
Spoke with Dr Marvel Plan, informed of pt request for pain med.  Order recieved

## 2013-05-25 NOTE — Anesthesia Preprocedure Evaluation (Signed)
Anesthesia Evaluation  Patient identified by MRN, date of birth, ID band Patient awake    Reviewed: Allergy & Precautions, H&P , NPO status , Patient's Chart, lab work & pertinent test results, reviewed documented beta blocker date and time   History of Anesthesia Complications Negative for: history of anesthetic complications  Airway Mallampati: I TM Distance: >3 FB Neck ROM: full    Dental  (+) Teeth Intact   Pulmonary sleep apnea , Recent URI , Residual Cough,  breath sounds clear to auscultation        Cardiovascular hypertension (CHTN), On Home Beta Blockers + dysrhythmias (sinus tach) Rhythm:regular Rate:Normal     Neuro/Psych negative neurological ROS  negative psych ROS   GI/Hepatic negative GI ROS, Neg liver ROS,   Endo/Other  diabetes (noncompliant with oral meds), Gestational, Oral Hypoglycemic AgentsMorbid obesity  Renal/GU negative Renal ROS     Musculoskeletal   Abdominal (+) + obese,   Peds  Hematology negative hematology ROS (+)   Anesthesia Other Findings   Reproductive/Obstetrics (+) Pregnancy                           Anesthesia Physical  Anesthesia Plan  ASA: III  Anesthesia Plan: Epidural   Post-op Pain Management:    Induction:   Airway Management Planned:   Additional Equipment:   Intra-op Plan:   Post-operative Plan:   Informed Consent: I have reviewed the patients History and Physical, chart, labs and discussed the procedure including the risks, benefits and alternatives for the proposed anesthesia with the patient or authorized representative who has indicated his/her understanding and acceptance.     Plan Discussed with: Surgeon and CRNA  Anesthesia Plan Comments:         Anesthesia Quick Evaluation

## 2013-05-25 NOTE — Progress Notes (Signed)
Patient ID: [name] [name], female   DOB: March 20, 1981, 32 y.o.   MRN: [id number] Pt having some vaginal pressure, but coping  BP 150's/90's  Due pm labetalol soon FHR overall reassuring, no significant decels +scalp stim  C/6/0-1-0 station  Pt making slow but steady progress Will continue to follow. All BS WNL

## 2013-05-25 NOTE — Progress Notes (Signed)
Patient ID: [name] [name], female   DOB: [birth year], 32 y.o.   MRN: [id number] Pt got uncomfortable again and needed epidural redosed.   FHR with decreased variability at times, does respond to fetal scalp stimulation.  Occasional mild variables, but no repetitive or     late decelerations BP got high and required an IV dose of labetalol  Cervix 90/4/-1  IUPC shows contractions approaching adequate, pitocin being increased gradually Will continue to follow progress, still in latent phase labor

## 2013-05-25 NOTE — Progress Notes (Signed)
Patient ID: [name] [name], female   DOB: May 01, 1981, 32 y.o.   MRN: [id number]  +FM, no LOF, no VB, occ ctx, uncomfortable.  Getting bored, but doing OK    AFVSS gen NAD FHT 140-150 mod var, occ var toco q 2-6  S/p cytotec overnight D/w pt POC and Dr. Marvel Plan to assess PCN for gbbs prophylaxis BP control with labetalol and nifedipine have been 140-150/80 iol secondary to oligo, [facility]

## 2013-05-25 NOTE — Progress Notes (Signed)
Patient ID: [name] [name], female   DOB: [birth year], 32 y.o.   MRN: [id number] Pt with IOL for oligohydramnios, CHTN, and gestational diabetes  Pt reports minimal contractions felt.  Received cytotec x 3 overnight  BS 109 this AM BP 170/100 at max, but just before took meds this AM  Now 150/90  FHR overall acceptable, does have intermittent variable decels.  Good variability and some accels Received PCN for +GBS  Cervix 50/1-2/-2  Attempted AROM but scant fluid noted, so unsure if completely ruptured  Will try pitocin and see how responds.

## 2013-05-26 ENCOUNTER — Encounter (HOSPITAL_COMMUNITY): Payer: Self-pay | Admitting: *Deleted

## 2013-05-26 MED ORDER — NIFEDIPINE ER 60 MG PO TB24
120.0000 mg | ORAL_TABLET | Freq: Every day | ORAL | Status: DC
  Administered 2013-05-26 – 2013-05-28 (×3): 120 mg via ORAL
  Filled 2013-05-26 (×4): qty 2

## 2013-05-26 MED ORDER — LABETALOL HCL 5 MG/ML IV SOLN
20.0000 mg | Freq: Once | INTRAVENOUS | Status: AC
  Administered 2013-05-26: 20 mg via INTRAVENOUS
  Filled 2013-05-26: qty 4

## 2013-05-26 MED ORDER — TETANUS-DIPHTH-ACELL PERTUSSIS 5-2.5-18.5 LF-MCG/0.5 IM SUSP: 0.5000 mL | Freq: Once | INTRAMUSCULAR | Status: DC

## 2013-05-26 MED ORDER — LABETALOL HCL 5 MG/ML IV SOLN
40.0000 mg | Freq: Once | INTRAVENOUS | Status: AC
  Administered 2013-05-26: 40 mg via INTRAVENOUS
  Filled 2013-05-26: qty 8

## 2013-05-26 MED ORDER — WITCH HAZEL-GLYCERIN EX PADS: 1.0000 "application " | MEDICATED_PAD | CUTANEOUS | Status: DC | PRN

## 2013-05-26 MED ORDER — ONDANSETRON HCL 4 MG/2ML IJ SOLN: 4.0000 mg | INTRAMUSCULAR | Status: DC | PRN

## 2013-05-26 MED ORDER — BENZOCAINE-MENTHOL 20-0.5 % EX AERO
1.0000 "application " | INHALATION_SPRAY | CUTANEOUS | Status: DC | PRN
  Filled 2013-05-26: qty 56

## 2013-05-26 MED ORDER — SENNOSIDES-DOCUSATE SODIUM 8.6-50 MG PO TABS
2.0000 | ORAL_TABLET | ORAL | Status: DC
  Administered 2013-05-26 – 2013-05-28 (×2): 2 via ORAL

## 2013-05-26 MED ORDER — PRENATAL MULTIVITAMIN CH
1.0000 | ORAL_TABLET | Freq: Every day | ORAL | Status: DC
  Administered 2013-05-26 – 2013-05-28 (×3): 1 via ORAL
  Filled 2013-05-26 (×3): qty 1

## 2013-05-26 MED ORDER — SIMETHICONE 80 MG PO CHEW: 80.0000 mg | CHEWABLE_TABLET | ORAL | Status: DC | PRN

## 2013-05-26 MED ORDER — OXYCODONE-ACETAMINOPHEN 5-325 MG PO TABS: 1.0000 | ORAL_TABLET | ORAL | Status: DC | PRN

## 2013-05-26 MED ORDER — ONDANSETRON HCL 4 MG PO TABS: 4.0000 mg | ORAL_TABLET | ORAL | Status: DC | PRN

## 2013-05-26 MED ORDER — IBUPROFEN 600 MG PO TABS
600.0000 mg | ORAL_TABLET | Freq: Four times a day (QID) | ORAL | Status: DC
  Administered 2013-05-26 – 2013-05-28 (×9): 600 mg via ORAL
  Filled 2013-05-26 (×9): qty 1

## 2013-05-26 MED ORDER — DIPHENHYDRAMINE HCL 25 MG PO CAPS: 25.0000 mg | ORAL_CAPSULE | Freq: Four times a day (QID) | ORAL | Status: DC | PRN

## 2013-05-26 MED ORDER — ZOLPIDEM TARTRATE 5 MG PO TABS: 5.0000 mg | ORAL_TABLET | Freq: Every evening | ORAL | Status: DC | PRN

## 2013-05-26 MED ORDER — DIBUCAINE 1 % RE OINT: 1.0000 "application " | TOPICAL_OINTMENT | RECTAL | Status: DC | PRN

## 2013-05-26 MED ORDER — LABETALOL HCL 200 MG PO TABS
200.0000 mg | ORAL_TABLET | Freq: Two times a day (BID) | ORAL | Status: DC
  Administered 2013-05-26 – 2013-05-28 (×5): 200 mg via ORAL
  Filled 2013-05-26 (×6): qty 1

## 2013-05-26 MED ORDER — LANOLIN HYDROUS EX OINT: TOPICAL_OINTMENT | CUTANEOUS | Status: DC | PRN

## 2013-05-26 NOTE — Anesthesia Postprocedure Evaluation (Signed)
  Anesthesia Post-op Note  Patient: [name] [name]  Procedure(s) Performed: * No procedures listed *  Patient Location: Mother/Baby  Anesthesia Type:Epidural  Level of Consciousness: awake  Airway and Oxygen Therapy: Patient Spontanous Breathing  Post-op Pain: mild  Post-op Assessment: Patient's Cardiovascular Status Stable and Respiratory Function Stable  Post-op Vital Signs: stable  Complications: No apparent anesthesia complications

## 2013-05-26 NOTE — Progress Notes (Signed)
Patient ID: [name] [name], female   DOB: Apr 03, 1981, 32 y.o.   MRN: [id number] Pt feeling lots of pressure  C/c/+2  Pushing  FHR acceptable variables with pushing

## 2013-05-26 NOTE — Progress Notes (Signed)
Patient ID: [name] [name], female   DOB: 18-Nov-1980, 32 y.o.   MRN: [id number] Pt feeling pressure, involuntarily pushing some  FHR with good variability, +mild variables with contractions  Contractions q 2 minutes  Cervix c/c/+1 on her side  Will try to labor down before pushing given unclear how much maternal effort we will have

## 2013-05-27 DIAGNOSIS — O4100X0 Oligohydramnios, unspecified trimester, not applicable or unspecified: Principal | ICD-10-CM | POA: Diagnosis present

## 2013-05-27 LAB — CBC
HCT: 29.2 % — ABNORMAL LOW (ref 36.0–46.0)
Hemoglobin: 9.7 g/dL — ABNORMAL LOW (ref 12.0–15.0)
MCH: 27.9 pg (ref 26.0–34.0)
MCHC: 33.2 g/dL (ref 30.0–36.0)
MCV: 83.9 fL (ref 78.0–100.0)
Platelets: 224 10*3/uL (ref 150–400)
RBC: 3.48 MIL/uL — ABNORMAL LOW (ref 3.87–5.11)
RDW: 14.7 % (ref 11.5–15.5)
WBC: 10.8 10*3/uL — ABNORMAL HIGH (ref 4.0–10.5)

## 2013-05-27 LAB — URIC ACID: Uric Acid, Serum: 8.5 mg/dL — ABNORMAL HIGH (ref 2.4–7.0)

## 2013-05-27 LAB — COMPREHENSIVE METABOLIC PANEL
ALT: 13 U/L (ref 0–35)
AST: 22 U/L (ref 0–37)
Albumin: 2.2 g/dL — ABNORMAL LOW (ref 3.5–5.2)
Alkaline Phosphatase: 104 U/L (ref 39–117)
BUN: 15 mg/dL (ref 6–23)
CO2: 22 mEq/L (ref 19–32)
Calcium: 9.3 mg/dL (ref 8.4–10.5)
Chloride: 100 mEq/L (ref 96–112)
Creatinine, Ser: 0.89 mg/dL (ref 0.50–1.10)
GFR calc Af Amer: >90 mL/min (ref >90)
GFR calc non Af Amer: 84 mL/min — ABNORMAL LOW (ref >90)
Glucose, Bld: 116 mg/dL — ABNORMAL HIGH (ref 70–99)
Potassium: 3.7 mEq/L (ref 3.5–5.1)
Sodium: 134 mEq/L — ABNORMAL LOW (ref 135–145)
Total Bilirubin: 0.2 mg/dL — ABNORMAL LOW (ref 0.3–1.2)
Total Protein: 6.4 g/dL (ref 6.0–8.3)

## 2013-05-27 MED ORDER — GUAIFENESIN 100 MG/5ML PO SYRP
200.0000 mg | ORAL_SOLUTION | ORAL | Status: DC | PRN
  Administered 2013-05-27: 200 mg via ORAL
  Filled 2013-05-27: qty 10

## 2013-05-27 NOTE — Lactation Note (Signed)
This note was copied from the chart of [name] Joletta Manner. Lactation Consultation Note; Called to mom's room- baby's fussing and she wants to "do something different" Suggested hand expression but mom is requesting pump. DEBP set up for mom and she pumped for 15 minutes. Obtained about 2 cc's of EBM.  Reviewed set up and cleaning of pump pieces. Grandmother changed diaper and dropper fed EBM and baby calm and alert. Dad asking about formula. Suggested pumping and giving EBM instead. Encouraged to try to put baby to breast whenever she sees feeding cues- if baby does not nurse,pump and feed any EBM. No questions at present. To call prn   Patient Name: [name] [name] [id number] Date: 05/27/2013 Reason for consult: Follow-up assessment   Maternal Data    Feeding    LATCH Score/Interventions                      Lactation Tools Discussed/Used Pump Review: Setup, frequency, and cleaning Initiated by:: DW Date initiated:: 05/27/13   Consult Status Consult Status: Follow-up Date: 05/28/13 Follow-up type: In-patient    Truddie Crumble 05/27/2013, 11:04 AM

## 2013-05-27 NOTE — Discharge Summary (Signed)
Obstetric Discharge Summary Reason for Admission: induction of labor Prenatal Procedures: NST and ultrasound Intrapartum Procedures: spontaneous vaginal delivery Postpartum Procedures: none Complications-Operative and Postpartum: second degree perineal laceration Hemoglobin  Date Value Range Status  05/27/2013 9.7* 12.0 - 15.0 g/dL Final     REPEATED TO VERIFY     DELTA CHECK NOTED     HCT  Date Value Range Status  05/27/2013 29.2* 36.0 - 46.0 % Final    Physical Exam:  General: alert and cooperative Lochia: appropriate Uterine Fundus: firm   Discharge Diagnoses: Term Pregnancy-delivered                                         Oligohydramnios                                         CHTN                                         Gestational diabetes                                         Morbid obesity  Discharge Information: Date: 05/27/2013 Activity: pelvic rest Diet: routine Medications: Ibuprofen, labetalol, procardia Condition: improved Instructions: refer to practice specific booklet Discharge to: home Follow-up Information   Follow up with Logan Bores, MD. Schedule an appointment as soon as possible for a visit in 2 weeks. (Blood pressure check)    Specialty:  Obstetrics and Gynecology   Contact information:   [address], [place] [address] [phone]       Newborn Data: Live born female  Birth Weight: 5 lb 11.9 oz (2605 g) APGAR: 9, 9  Home with mother.  Logan Bores 05/27/2013, 10:54 AM

## 2013-05-27 NOTE — Progress Notes (Signed)
Post Partum Day 1  Subjective: no complaints, up ad lib and tolerating PO  Objective: Blood pressure 130/80, pulse 86, temperature 97.6 F (36.4 C), temperature source Oral, resp. rate 18, height _0  (1.651 m), weight 167.831 kg (370 lb), last menstrual period 08/27/2012, SpO2 92.00%, unknown if currently breastfeeding.  Physical Exam:  General: alert and cooperative Lochia: appropriate Uterine Fundus: firm    Recent Labs  05/25/13 1017 05/27/13 0603  HGB 11.9* 9.7*  HCT 36.4 29.2*    Assessment/Plan: Plan for discharge tomorrow BP normalizing, back on meds with decreased labetalol to 265m po BID and same procardia dose Baby doing well.   LOS: 3 days   Carmie Lanpher W 05/27/2013, 10:52 AM

## 2013-05-28 MED ORDER — LABETALOL HCL 200 MG PO TABS: 200.0000 mg | ORAL_TABLET | Freq: Two times a day (BID) | ORAL | Status: DC

## 2013-05-28 MED ORDER — IBUPROFEN 600 MG PO TABS: 600.0000 mg | ORAL_TABLET | Freq: Four times a day (QID) | ORAL | Status: DC

## 2013-05-28 NOTE — Progress Notes (Signed)
Post discharge review completed.

## 2013-05-28 NOTE — Progress Notes (Signed)
Post Partum Day 2 Subjective: no complaints, up ad lib and tolerating PO  Pain minimal  Objective: Blood pressure 122/78, pulse 87, temperature 97.8 F (36.6 C), temperature source Oral, resp. rate 20, height 5' 5" (1.651 m), weight 167.831 kg (370 lb), last menstrual period 08/27/2012, SpO2 92.00%, unknown if currently breastfeeding.  Physical Exam:  General: alert and cooperative Lochia: appropriate Uterine Fundus: firm    Recent Labs  05/25/13 1017 05/27/13 0603  HGB 11.9* 9.7*  HCT 36.4 29.2*    Assessment/Plan: Discharge home Motrin Will continue BP meds as taking for now, recheck BP in 2 weeks.  On Procardia q day and Labetalol 273m po BID.   LOS: 4 days   Chaynce Schafer W 05/28/2013, 8:42 AM

## 2013-05-31 ENCOUNTER — Other Ambulatory Visit (HOSPITAL_COMMUNITY): Payer: Medicaid Other

## 2013-08-01 ENCOUNTER — Other Ambulatory Visit: Payer: Self-pay | Admitting: Physician Assistant

## 2013-12-12 ENCOUNTER — Emergency Department (HOSPITAL_COMMUNITY)
Admission: EM | Admit: 2013-12-12 | Discharge: 2013-12-13 | Disposition: A | Discharge status: Home / Self Care | Payer: Medicaid Other | Attending: Emergency Medicine | Admitting: Emergency Medicine

## 2013-12-12 ENCOUNTER — Encounter (HOSPITAL_COMMUNITY): Payer: Self-pay | Admitting: Emergency Medicine

## 2013-12-12 DIAGNOSIS — Y9389 Activity, other specified: Secondary | ICD-10-CM | POA: Insufficient documentation

## 2013-12-12 DIAGNOSIS — S335XXA Sprain of ligaments of lumbar spine, initial encounter: Secondary | ICD-10-CM | POA: Insufficient documentation

## 2013-12-12 DIAGNOSIS — Z3202 Encounter for pregnancy test, result negative: Secondary | ICD-10-CM | POA: Insufficient documentation

## 2013-12-12 DIAGNOSIS — Y92009 Unspecified place in unspecified non-institutional (private) residence as the place of occurrence of the external cause: Secondary | ICD-10-CM | POA: Insufficient documentation

## 2013-12-12 DIAGNOSIS — X500XXA Overexertion from strenuous movement or load, initial encounter: Secondary | ICD-10-CM | POA: Insufficient documentation

## 2013-12-12 DIAGNOSIS — I10 Essential (primary) hypertension: Secondary | ICD-10-CM | POA: Insufficient documentation

## 2013-12-12 DIAGNOSIS — Z8632 Personal history of gestational diabetes: Secondary | ICD-10-CM | POA: Insufficient documentation

## 2013-12-12 DIAGNOSIS — Z79899 Other long term (current) drug therapy: Secondary | ICD-10-CM | POA: Insufficient documentation

## 2013-12-12 DIAGNOSIS — X503XXA Overexertion from repetitive movements, initial encounter: Secondary | ICD-10-CM | POA: Insufficient documentation

## 2013-12-12 DIAGNOSIS — S39012A Strain of muscle, fascia and tendon of lower back, initial encounter: Secondary | ICD-10-CM

## 2013-12-12 LAB — URINALYSIS, ROUTINE W REFLEX MICROSCOPIC
Bilirubin Urine: NEGATIVE
Glucose, UA: NEGATIVE mg/dL
Ketones, ur: 15 mg/dL — AB
Nitrite: NEGATIVE
Protein, ur: NEGATIVE mg/dL
Specific Gravity, Urine: 1.016 (ref 1.005–1.030)
Urobilinogen, UA: 1 mg/dL (ref 0.0–1.0)
pH: 8 (ref 5.0–8.0)

## 2013-12-12 LAB — I-STAT CHEM 8, ED
BUN: 6 mg/dL (ref 6–23)
Calcium, Ion: 1.16 mmol/L (ref 1.12–1.23)
Chloride: 102 mEq/L (ref 96–112)
Creatinine, Ser: 0.6 mg/dL (ref 0.50–1.10)
Glucose, Bld: 116 mg/dL — ABNORMAL HIGH (ref 70–99)
HCT: 40 % (ref 36.0–46.0)
Hemoglobin: 13.6 g/dL (ref 12.0–15.0)
Potassium: 3.7 mEq/L (ref 3.7–5.3)
Sodium: 140 mEq/L (ref 137–147)
TCO2: 26 mmol/L (ref 0–100)

## 2013-12-12 LAB — URINE MICROSCOPIC-ADD ON

## 2013-12-12 LAB — PREGNANCY, URINE: Preg Test, Ur: NEGATIVE

## 2013-12-12 LAB — POC URINE PREG, ED: Preg Test, Ur: NEGATIVE

## 2013-12-12 MED ORDER — LABETALOL HCL 200 MG PO TABS
200.0000 mg | ORAL_TABLET | Freq: Once | ORAL | Status: AC
  Administered 2013-12-12: 200 mg via ORAL
  Filled 2013-12-12: qty 1

## 2013-12-12 NOTE — ED Notes (Signed)
Pt reports lower back pain x 1 week specifically to R lower back. Has 6 month at home that she carries around. No injury. Pt bp elevated- just started back taking her labetalol this week.

## 2013-12-12 NOTE — ED Provider Notes (Signed)
CSN: [id number]     Arrival date & time 12/12/13  1800 History   First MD Initiated Contact with Patient 12/12/13 2122     Chief Complaint  Patient presents with  . Back Pain     (Consider location/radiation/quality/duration/timing/severity/associated sxs/prior Treatment) HPI Comments: Patient is a 33 year old morbidly obese female with a past medical history of hypertension and diabetes who presents to the emergency department complaining of low back pain x1 week. Patient states she strained the right side of her lower back when carrying around her 62-monthold up and down the stairs. Pain has been constant, nonradiating, worse with leaning forward. She has taken ibuprofen and aleve with slight relief. Denies numbness or tingling down her extremities. Denies loss of control bowels or bladder or saddle anesthesia. No fevers, chills or night sweats. It is also noted that she was hypertensive on arrival, 227/146. States prior to her pregnancy she was on labetalol and an unknown hypertension medication, on chart review the medication was nifedipine. During her pregnancy she was decreased to 200 mg twice daily of labetalol from 400 mg. States for the past month she has been off of her blood pressure medications because she did not have a chance to see her doctor, she began taking them again 3 days ago. Denies headaches, vision change, chest pain, shortness of breath or urinary problems.  Patient is a 33y.o. female presenting with back pain. The history is provided by the patient.  Back Pain   Past Medical History  Diagnosis Date  . Sinus tachycardia   . Morbid obesity   . Hypertension   . Diabetes mellitus without complication     GDM  . HTN in pregnancy, chronic 05/24/2013  . Gestational diabetes mellitus in pregnancy 05/24/2013   History reviewed. No pertinent past surgical history. Family History  Problem Relation Age of Onset  . Hypertension Mother   . Diabetes Other    History   Substance Use Topics  . Smoking status: Never Smoker   . Smokeless tobacco: Not on file  . Alcohol Use: No   OB History   Grav Para Term Preterm Abortions TAB SAB Ect Mult Living   _0 0 0 0 0 0 0 1     Review of Systems  Musculoskeletal: Positive for back pain.  All other systems reviewed and are negative.     Allergies  Review of patient's allergies indicates no known allergies.  Home Medications   Prior to Admission medications   Medication Sig Start Date End Date Taking? Authorizing Provider  ibuprofen (ADVIL,MOTRIN) 600 MG tablet Take 600 mg by mouth every 6 (six) hours as needed for mild pain.   Yes Historical Provider, MD  labetalol (NORMODYNE) 200 MG tablet Take 1 tablet (200 mg total) by mouth 2 (two) times daily. 05/28/13  Yes KLogan Bores MD  NIFEdipine (PROCARDIA XL/ADALAT-CC) 60 MG 24 hr tablet Take 120 mg by mouth daily.   Yes Historical Provider, MD   BP 172/98  Pulse 91  Temp(Src) 98.3 F (36.8 C) (Oral)  Resp 18  Ht _1  (1.676 m)  Wt 370 lb (167.831 kg)  BMI 59.75 kg/m2  SpO2 91%  LMP 12/12/2013 Physical Exam  Nursing note and vitals reviewed. Constitutional: She is oriented to person, place, and time. She appears well-developed and well-nourished. No distress.  Morbidly obese.  HENT:  Head: Normocephalic and atraumatic.  Mouth/Throat: Oropharynx is clear and moist.  Eyes: Conjunctivae and EOM are normal. Pupils are equal,  round, and reactive to light.  Neck: Normal range of motion. Neck supple. No spinous process tenderness and no muscular tenderness present.  Cardiovascular: Normal rate, regular rhythm and normal heart sounds.   Pulmonary/Chest: Effort normal and breath sounds normal. No respiratory distress.  Musculoskeletal: She exhibits no edema.  Generalized tenderness across lower back. Full ROM, pain with flexion.  Neurological: She is alert and oriented to person, place, and time. She has normal strength.  Strength lower  extremities 5/5 and equal bilateral. Sensation intact. Normal gait.  Skin: Skin is warm and dry. No rash noted. She is not diaphoretic.  Psychiatric: She has a normal mood and affect. Her behavior is normal.    ED Course  Procedures (including critical care time) Labs Review Labs Reviewed  URINALYSIS, ROUTINE W REFLEX MICROSCOPIC - Abnormal; Notable for the following:    APPearance CLOUDY (*)    Hgb urine dipstick LARGE (*)    Ketones, ur 15 (*)    Leukocytes, UA SMALL (*)    All other components within normal limits  URINE MICROSCOPIC-ADD ON - Abnormal; Notable for the following:    Squamous Epithelial / LPF FEW (*)    All other components within normal limits  I-STAT CHEM 8, ED - Abnormal; Notable for the following:    Glucose, Bld 116 (*)    All other components within normal limits  PREGNANCY, URINE  BASIC METABOLIC PANEL  POC URINE PREG, ED    Imaging Review No results found.   EKG Interpretation   Date/Time:  Wednesday December 12 2013 18:22:33 EDT Ventricular Rate:  91 PR Interval:  204 QRS Duration: 92 QT Interval:  372 QTC Calculation: 457 R Axis:   111 Text Interpretation:  Normal sinus rhythm Right axis deviation Septal  infarct , age undetermined Abnormal ECG No significant change since last  tracing Confirmed by [facility]  MD, Juanda Crumble [id number]) on 12/12/2013 6:26:35 PM      MDM   Final diagnoses:  Lumbar strain  Hypertension    Patient presenting with back pain after lifting her child and asymptomatic hypertension. She is well appearing and in no apparent distress. No red flags concerning patient's back pain. No focal neurologic deficits, no signs of cauda equina, afebrile, ambulating without difficulty. She is morbidly obese which may be contributing to her back pain. Regarding hypertension, she has been off of her labetalol for 1 month. Blood pressure initially 227/146, she was given a dose of labetalol. It was also noted that blood pressure cuff was too  small for patient, and once the appropriate size cuff was placed on patient's arm, blood pressure 172/98. No signs of end organ damage. Given patient was on 400 mg BID labetalol and was only taking 200 mg BID, I advised her to increase back up to 400 mg twice a day. She will followup with her primary care physician regarding both back pain and hypertension. She is stable for discharge. Return precautions given. Patient states understanding of plan and is agreeable.    Illene Labrador, PA-C 12/13/13 0023

## 2013-12-13 LAB — BASIC METABOLIC PANEL
BUN: 9 mg/dL (ref 6–23)
CO2: 25 mEq/L (ref 19–32)
Calcium: 9.5 mg/dL (ref 8.4–10.5)
Chloride: 100 mEq/L (ref 96–112)
Creatinine, Ser: 0.64 mg/dL (ref 0.50–1.10)
GFR calc Af Amer: >90 mL/min (ref >90)
GFR calc non Af Amer: >90 mL/min (ref >90)
Glucose, Bld: 118 mg/dL — ABNORMAL HIGH (ref 70–99)
Potassium: 3.9 mEq/L (ref 3.7–5.3)
Sodium: 140 mEq/L (ref 137–147)

## 2013-12-13 MED ORDER — LABETALOL HCL 200 MG PO TABS: ORAL_TABLET | ORAL | Status: DC

## 2013-12-13 MED ORDER — IBUPROFEN 800 MG PO TABS: 800.0000 mg | ORAL_TABLET | Freq: Three times a day (TID) | ORAL | Status: DC

## 2013-12-13 NOTE — Discharge Instructions (Signed)
Take your blood pressure medication twice daily as directed. Take ibuprofen as directed as needed for back pain. Alternate ice pack and heating pad to her back. Avoid heavy lifting or hard physical activity. Followup with your primary care physician regarding both your back pain and hypertension.  Back Pain, Adult Low back pain is very common. About 1 in 5 people have back pain.The cause of low back pain is rarely dangerous. The pain often gets better over time.About half of people with a sudden onset of back pain feel better in just 2 weeks. About 8 in 10 people feel better by 6 weeks.  CAUSES Some common causes of back pain include:  Strain of the muscles or ligaments supporting the spine.  Wear and tear (degeneration) of the spinal discs.  Arthritis.  Direct injury to the back. DIAGNOSIS Most of the time, the direct cause of low back pain is not known.However, back pain can be treated effectively even when the exact cause of the pain is unknown.Answering your caregiver's questions about your overall health and symptoms is one of the most accurate ways to make sure the cause of your pain is not dangerous. If your caregiver needs more information, he or she may order lab work or imaging tests (X-rays or MRIs).However, even if imaging tests show changes in your back, this usually does not require surgery. HOME CARE INSTRUCTIONS For many people, back pain returns.Since low back pain is rarely dangerous, it is often a condition that people can learn to [facility] their own.   Remain active. It is stressful on the back to sit or stand in one place. Do not sit, drive, or stand in one place for more than 30 minutes at a time. Take short walks on level surfaces as soon as pain allows.Try to increase the length of time you walk each day.  Do not stay in bed.Resting more than 1 or 2 days can delay your recovery.  Do not avoid exercise or work.Your body is made to move.It is not dangerous to  be active, even though your back may hurt.Your back will likely heal faster if you return to being active before your pain is gone.  Pay attention to your body when you bend and lift. Many people have less discomfortwhen lifting if they bend their knees, keep the load close to their bodies,and avoid twisting. Often, the most comfortable positions are those that put less stress on your recovering back.  Find a comfortable position to sleep. Use a firm mattress and lie on your side with your knees slightly bent. If you lie on your back, put a pillow under your knees.  Only take over-the-counter or prescription medicines as directed by your caregiver. Over-the-counter medicines to reduce pain and inflammation are often the most helpful.Your caregiver may prescribe muscle relaxant drugs.These medicines help dull your pain so you can more quickly return to your normal activities and healthy exercise.  Put ice on the injured area.  Put ice in a plastic bag.  Place a towel between your skin and the bag.  Leave the ice on for 15-20 minutes, 03-04 times a day for the first 2 to 3 days. After that, ice and heat may be alternated to reduce pain and spasms.  Ask your caregiver about trying back exercises and gentle massage. This may be of some benefit.  Avoid feeling anxious or stressed.Stress increases muscle tension and can worsen back pain.It is important to recognize when you are anxious or stressed and learn  ways to manage it.Exercise is a great option. SEEK MEDICAL CARE IF:  You have pain that is not relieved with rest or medicine.  You have pain that does not improve in 1 week.  You have new symptoms.  You are generally not feeling well. SEEK IMMEDIATE MEDICAL CARE IF:   You have pain that radiates from your back into your legs.  You develop new bowel or bladder control problems.  You have unusual weakness or numbness in your arms or legs.  You develop nausea or  vomiting.  You develop abdominal pain.  You feel faint. Document Released: 08/09/2005 Document Revised: 02/08/2012 Document Reviewed: 12/28/2010 [facility] Patient Information 2014 [place], [place].  Hypertension As your heart beats, it forces blood through your arteries. This force is your blood pressure. If the pressure is too high, it is called hypertension (HTN) or high blood pressure. HTN is dangerous because you may have it and not know it. High blood pressure may mean that your heart has to work harder to pump blood. Your arteries may be narrow or stiff. The extra work puts you at risk for heart disease, stroke, and other problems.  Blood pressure consists of two numbers, a higher number over a lower, 110/72, for example. It is stated as "110 over 72." The ideal is below 120 for the top number (systolic) and under 80 for the bottom (diastolic). Write down your blood pressure today. You should pay close attention to your blood pressure if you have certain conditions such as:  Heart failure.  Prior heart attack.  Diabetes  Chronic kidney disease.  Prior stroke.  Multiple risk factors for heart disease. To see if you have HTN, your blood pressure should be measured while you are seated with your arm held at the level of the heart. It should be measured at least twice. A one-time elevated blood pressure reading (especially in the Emergency Department) does not mean that you need treatment. There may be conditions in which the blood pressure is different between your right and left arms. It is important to see your caregiver soon for a recheck. Most people have essential hypertension which means that there is not a specific cause. This type of high blood pressure may be lowered by changing lifestyle factors such as:  Stress.  Smoking.  Lack of exercise.  Excessive weight.  Drug/tobacco/alcohol use.  Eating less salt. Most people do not have symptoms from high blood pressure until  it has caused damage to the body. Effective treatment can often prevent, delay or reduce that damage. TREATMENT  When a cause has been identified, treatment for high blood pressure is directed at the cause. There are a large number of medications to treat HTN. These fall into several categories, and your caregiver will help you select the medicines that are best for you. Medications may have side effects. You should review side effects with your caregiver. If your blood pressure stays high after you have made lifestyle changes or started on medicines,   Your medication(s) may need to be changed.  Other problems may need to be addressed.  Be certain you understand your prescriptions, and know how and when to take your medicine.  Be sure to follow up with your caregiver within the time frame advised (usually within two weeks) to have your blood pressure rechecked and to review your medications.  If you are taking more than one medicine to lower your blood pressure, make sure you know how and at what times they  should be taken. Taking two medicines at the same time can result in blood pressure that is too low. SEEK IMMEDIATE MEDICAL CARE IF:  You develop a severe headache, blurred or changing vision, or confusion.  You have unusual weakness or numbness, or a faint feeling.  You have severe chest or abdominal pain, vomiting, or breathing problems. MAKE SURE YOU:   Understand these instructions.  Will watch your condition.  Will get help right away if you are not doing well or get worse. Document Released: 08/09/2005 Document Revised: 11/01/2011 Document Reviewed: 03/29/2008 [facility] Patient Information 2014 [place].  Lumbosacral Strain Lumbosacral strain is a strain of any of the parts that make up your lumbosacral vertebrae. Your lumbosacral vertebrae are the bones that make up the lower third of your backbone. Your lumbosacral vertebrae are held together by muscles and tough,  fibrous tissue (ligaments).  CAUSES  A sudden blow to your back can cause lumbosacral strain. Also, anything that causes an excessive stretch of the muscles in the low back can cause this strain. This is typically seen when people exert themselves strenuously, fall, lift heavy objects, bend, or crouch repeatedly. RISK FACTORS  Physically demanding work.  Participation in pushing or pulling sports or sports that require sudden twist of the back (tennis, golf, baseball).  Weight lifting.  Excessive lower back curvature.  Forward-tilted pelvis.  Weak back or abdominal muscles or both.  Tight hamstrings. SIGNS AND SYMPTOMS  Lumbosacral strain may cause pain in the area of your injury or pain that moves (radiates) down your leg.  DIAGNOSIS Your health care provider can often diagnose lumbosacral strain through a physical exam. In some cases, you may need tests such as X-ray exams.  TREATMENT  Treatment for your lower back injury depends on many factors that your clinician will have to evaluate. However, most treatment will include the use of anti-inflammatory medicines. HOME CARE INSTRUCTIONS   Avoid hard physical activities (tennis, racquetball, waterskiing) if you are not in proper physical condition for it. This may aggravate or create problems.  If you have a back problem, avoid sports requiring sudden body movements. Swimming and walking are generally safer activities.  Maintain good posture.  Maintain a healthy weight.  For acute conditions, you may put ice on the injured area.  Put ice in a plastic bag.  Place a towel between your skin and the bag.  Leave the ice on for 20 minutes, 2 3 times a day.  When the low back starts healing, stretching and strengthening exercises may be recommended. SEEK MEDICAL CARE IF:  Your back pain is getting worse.  You experience severe back pain not relieved with medicines. SEEK IMMEDIATE MEDICAL CARE IF:   You have numbness,  tingling, weakness, or problems with the use of your arms or legs.  There is a change in bowel or bladder control.  You have increasing pain in any area of the body, including your belly (abdomen).  You notice shortness of breath, dizziness, or feel faint.  You feel sick to your stomach (nauseous), are throwing up (vomiting), or become sweaty.  You notice discoloration of your toes or legs, or your feet get very cold. MAKE SURE YOU:   Understand these instructions.  Will watch your condition.  Will get help right away if you are not doing well or get worse. Document Released: 05/19/2005 Document Revised: 05/30/2013 Document Reviewed: 03/28/2013 [facility] Patient Information 2014 [place], [place].

## 2013-12-13 NOTE — ED Provider Notes (Signed)
Medical screening examination/treatment/procedure(s) were performed by non-physician practitioner and as supervising physician I was immediately available for consultation/collaboration.   EKG Interpretation   Date/Time:  Wednesday December 12 2013 18:22:33 EDT Ventricular Rate:  91 PR Interval:  204 QRS Duration: 92 QT Interval:  372 QTC Calculation: 457 R Axis:   111 Text Interpretation:  Normal sinus rhythm Right axis deviation Septal  infarct , age undetermined Abnormal ECG No significant change since last  tracing Confirmed by [facility]  MD, Jhoana Upham [phone]) on 12/12/2013 6:26:35 PM        Mercie Eon. Karle Starch, MD 12/13/13 [phone]

## 2014-06-24 ENCOUNTER — Encounter (HOSPITAL_COMMUNITY): Payer: Self-pay | Admitting: Emergency Medicine

## 2016-04-20 ENCOUNTER — Encounter: Payer: Self-pay | Admitting: Cardiology

## 2016-04-29 NOTE — Progress Notes (Deleted)
HPI: 35 year old female for evaluation of hypertension. She has been seen previously but not since 11/30/2012. Previous sleep study showed severe sleep apnea. Renal dopplers were normal. Echo 06/17/11: mild LVH, EF 50-55%, mild LAE. Since last seen,   Current Outpatient Prescriptions  Medication Sig Dispense Refill  . ibuprofen (ADVIL,MOTRIN) 600 MG tablet Take 600 mg by mouth every 6 (six) hours as needed for mild pain.    Marland Kitchen ibuprofen (ADVIL,MOTRIN) 800 MG tablet Take 1 tablet (800 mg total) by mouth 3 (three) times daily. 21 tablet 0  . labetalol (NORMODYNE) 200 MG tablet Take 1 tablet (200 mg total) by mouth 2 (two) times daily. 60 tablet 2  . labetalol (NORMODYNE) 200 MG tablet Take 2 tablets PO twice daily. 120 tablet 0  . NIFEdipine (PROCARDIA XL/ADALAT-CC) 60 MG 24 hr tablet Take 120 mg by mouth daily.     No current facility-administered medications for this visit.     No Known Allergies  Past Medical History:  Diagnosis Date  . Diabetes mellitus without complication (HCC)    GDM  . Gestational diabetes mellitus in pregnancy 05/24/2013  . HTN in pregnancy, chronic 05/24/2013  . Hypertension   . Morbid obesity ([place])   . Sinus tachycardia ([place])     No past surgical history on file.  Social History   Social History  . Marital status: Single    Spouse name: N/A  . Number of children: N/A  . Years of education: N/A   Occupational History  . 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 Smoking status: Never Smoker  . Smokeless tobacco: Never Used  . Alcohol use No  . Drug use: No  . Sexual activity: Not on file   Other Topics Concern  . Not on file   Social History Narrative   LIVES ALONE   WORKS AT [employer]   DENIES ANY TOBACCO OR ETOH USE          Family History  Problem Relation Age of Onset  . Hypertension Mother   . Diabetes Other     ROS: no fevers or chills, productive cough, hemoptysis, dysphasia,  odynophagia, melena, hematochezia, dysuria, hematuria, rash, seizure activity, orthopnea, PND, pedal edema, claudication. Remaining systems are negative.  Physical Exam:   unknown if currently breastfeeding.  General:  Well developed/well nourished in NAD Skin warm/dry Patient not depressed No peripheral clubbing Back-normal HEENT-normal/normal eyelids Neck supple/normal carotid upstroke bilaterally; no bruits; no JVD; no thyromegaly chest - CTA/ normal expansion CV - RRR/normal S1 and S2; no murmurs, rubs or gallops;  PMI nondisplaced Abdomen -NT/ND, no HSM, no mass, + bowel sounds, no bruit 2+ femoral pulses, no bruits Ext-no edema, chords, 2+ DP Neuro-grossly nonfocal  ECG

## 2016-05-06 ENCOUNTER — Ambulatory Visit: Payer: Self-pay | Admitting: Cardiology

## 2016-05-25 ENCOUNTER — Emergency Department (HOSPITAL_COMMUNITY): Payer: PRIVATE HEALTH INSURANCE

## 2016-05-25 ENCOUNTER — Inpatient Hospital Stay (HOSPITAL_COMMUNITY)
Admission: EM | Admit: 2016-05-25 | Discharge: 2016-06-29 | DRG: 004 | Disposition: A | Discharge status: Rehabilitation Facility | Payer: PRIVATE HEALTH INSURANCE | Attending: Internal Medicine | Admitting: Internal Medicine

## 2016-05-25 ENCOUNTER — Encounter (HOSPITAL_COMMUNITY): Payer: Self-pay | Admitting: Emergency Medicine

## 2016-05-25 DIAGNOSIS — I160 Hypertensive urgency: Secondary | ICD-10-CM | POA: Diagnosis not present

## 2016-05-25 DIAGNOSIS — J9621 Acute and chronic respiratory failure with hypoxia: Secondary | ICD-10-CM

## 2016-05-25 DIAGNOSIS — I248 Other forms of acute ischemic heart disease: Secondary | ICD-10-CM | POA: Diagnosis present

## 2016-05-25 DIAGNOSIS — I509 Heart failure, unspecified: Secondary | ICD-10-CM | POA: Diagnosis present

## 2016-05-25 DIAGNOSIS — J810 Acute pulmonary edema: Secondary | ICD-10-CM | POA: Diagnosis not present

## 2016-05-25 DIAGNOSIS — I455 Other specified heart block: Secondary | ICD-10-CM | POA: Diagnosis present

## 2016-05-25 DIAGNOSIS — I10 Essential (primary) hypertension: Secondary | ICD-10-CM | POA: Diagnosis present

## 2016-05-25 DIAGNOSIS — G4733 Obstructive sleep apnea (adult) (pediatric): Secondary | ICD-10-CM | POA: Diagnosis not present

## 2016-05-25 DIAGNOSIS — Z9119 Patient's noncompliance with other medical treatment and regimen: Secondary | ICD-10-CM

## 2016-05-25 DIAGNOSIS — Z6841 Body Mass Index (BMI) 40.0 and over, adult: Secondary | ICD-10-CM | POA: Diagnosis not present

## 2016-05-25 DIAGNOSIS — J9620 Acute and chronic respiratory failure, unspecified whether with hypoxia or hypercapnia: Secondary | ICD-10-CM | POA: Diagnosis not present

## 2016-05-25 DIAGNOSIS — N179 Acute kidney failure, unspecified: Secondary | ICD-10-CM | POA: Diagnosis present

## 2016-05-25 DIAGNOSIS — R000 Tachycardia, unspecified: Secondary | ICD-10-CM | POA: Diagnosis present

## 2016-05-25 DIAGNOSIS — Z79899 Other long term (current) drug therapy: Secondary | ICD-10-CM

## 2016-05-25 DIAGNOSIS — R4020 Unspecified coma: Secondary | ICD-10-CM

## 2016-05-25 DIAGNOSIS — B370 Candidal stomatitis: Secondary | ICD-10-CM | POA: Diagnosis present

## 2016-05-25 DIAGNOSIS — E870 Hyperosmolality and hypernatremia: Secondary | ICD-10-CM | POA: Diagnosis present

## 2016-05-25 DIAGNOSIS — I5032 Chronic diastolic (congestive) heart failure: Secondary | ICD-10-CM

## 2016-05-25 DIAGNOSIS — R5381 Other malaise: Secondary | ICD-10-CM | POA: Diagnosis not present

## 2016-05-25 DIAGNOSIS — I3139 Other pericardial effusion (noninflammatory): Secondary | ICD-10-CM

## 2016-05-25 DIAGNOSIS — R001 Bradycardia, unspecified: Secondary | ICD-10-CM | POA: Diagnosis present

## 2016-05-25 DIAGNOSIS — Z789 Other specified health status: Secondary | ICD-10-CM

## 2016-05-25 DIAGNOSIS — E119 Type 2 diabetes mellitus without complications: Secondary | ICD-10-CM

## 2016-05-25 DIAGNOSIS — R1310 Dysphagia, unspecified: Secondary | ICD-10-CM | POA: Diagnosis not present

## 2016-05-25 DIAGNOSIS — E876 Hypokalemia: Secondary | ICD-10-CM | POA: Diagnosis present

## 2016-05-25 DIAGNOSIS — J811 Chronic pulmonary edema: Secondary | ICD-10-CM

## 2016-05-25 DIAGNOSIS — M7989 Other specified soft tissue disorders: Secondary | ICD-10-CM | POA: Diagnosis not present

## 2016-05-25 DIAGNOSIS — J9611 Chronic respiratory failure with hypoxia: Secondary | ICD-10-CM

## 2016-05-25 DIAGNOSIS — I110 Hypertensive heart disease with heart failure: Secondary | ICD-10-CM | POA: Diagnosis present

## 2016-05-25 DIAGNOSIS — I5030 Unspecified diastolic (congestive) heart failure: Secondary | ICD-10-CM | POA: Insufficient documentation

## 2016-05-25 DIAGNOSIS — N289 Disorder of kidney and ureter, unspecified: Secondary | ICD-10-CM

## 2016-05-25 DIAGNOSIS — J189 Pneumonia, unspecified organism: Secondary | ICD-10-CM | POA: Diagnosis not present

## 2016-05-25 DIAGNOSIS — J9622 Acute and chronic respiratory failure with hypercapnia: Secondary | ICD-10-CM | POA: Diagnosis not present

## 2016-05-25 DIAGNOSIS — E66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: Secondary | ICD-10-CM

## 2016-05-25 DIAGNOSIS — I2489 Other forms of acute ischemic heart disease: Secondary | ICD-10-CM

## 2016-05-25 DIAGNOSIS — I272 Pulmonary hypertension, unspecified: Secondary | ICD-10-CM | POA: Diagnosis not present

## 2016-05-25 DIAGNOSIS — Z9114 Patient's other noncompliance with medication regimen: Secondary | ICD-10-CM

## 2016-05-25 DIAGNOSIS — G9340 Encephalopathy, unspecified: Secondary | ICD-10-CM | POA: Diagnosis not present

## 2016-05-25 DIAGNOSIS — E118 Type 2 diabetes mellitus with unspecified complications: Secondary | ICD-10-CM

## 2016-05-25 DIAGNOSIS — T501X5A Adverse effect of loop [high-ceiling] diuretics, initial encounter: Secondary | ICD-10-CM | POA: Diagnosis not present

## 2016-05-25 DIAGNOSIS — Z8249 Family history of ischemic heart disease and other diseases of the circulatory system: Secondary | ICD-10-CM

## 2016-05-25 DIAGNOSIS — J9601 Acute respiratory failure with hypoxia: Secondary | ICD-10-CM

## 2016-05-25 DIAGNOSIS — K761 Chronic passive congestion of liver: Secondary | ICD-10-CM | POA: Diagnosis present

## 2016-05-25 DIAGNOSIS — J9612 Chronic respiratory failure with hypercapnia: Secondary | ICD-10-CM

## 2016-05-25 DIAGNOSIS — R9431 Abnormal electrocardiogram [ECG] [EKG]: Secondary | ICD-10-CM | POA: Insufficient documentation

## 2016-05-25 DIAGNOSIS — R1313 Dysphagia, pharyngeal phase: Secondary | ICD-10-CM | POA: Diagnosis present

## 2016-05-25 DIAGNOSIS — J9690 Respiratory failure, unspecified, unspecified whether with hypoxia or hypercapnia: Secondary | ICD-10-CM

## 2016-05-25 DIAGNOSIS — E1165 Type 2 diabetes mellitus with hyperglycemia: Secondary | ICD-10-CM | POA: Diagnosis present

## 2016-05-25 DIAGNOSIS — I161 Hypertensive emergency: Secondary | ICD-10-CM | POA: Diagnosis present

## 2016-05-25 DIAGNOSIS — F329 Major depressive disorder, single episode, unspecified: Secondary | ICD-10-CM | POA: Diagnosis present

## 2016-05-25 DIAGNOSIS — I5033 Acute on chronic diastolic (congestive) heart failure: Secondary | ICD-10-CM | POA: Diagnosis present

## 2016-05-25 DIAGNOSIS — Z930 Tracheostomy status: Secondary | ICD-10-CM | POA: Diagnosis not present

## 2016-05-25 DIAGNOSIS — Z0189 Encounter for other specified special examinations: Secondary | ICD-10-CM

## 2016-05-25 DIAGNOSIS — F3289 Other specified depressive episodes: Secondary | ICD-10-CM | POA: Diagnosis not present

## 2016-05-25 DIAGNOSIS — I313 Pericardial effusion (noninflammatory): Secondary | ICD-10-CM | POA: Diagnosis present

## 2016-05-25 DIAGNOSIS — I5031 Acute diastolic (congestive) heart failure: Secondary | ICD-10-CM | POA: Diagnosis not present

## 2016-05-25 DIAGNOSIS — R1010 Upper abdominal pain, unspecified: Secondary | ICD-10-CM | POA: Diagnosis not present

## 2016-05-25 DIAGNOSIS — F321 Major depressive disorder, single episode, moderate: Secondary | ICD-10-CM

## 2016-05-25 DIAGNOSIS — R109 Unspecified abdominal pain: Secondary | ICD-10-CM | POA: Diagnosis present

## 2016-05-25 DIAGNOSIS — Z452 Encounter for adjustment and management of vascular access device: Secondary | ICD-10-CM

## 2016-05-25 DIAGNOSIS — I2721 Secondary pulmonary arterial hypertension: Secondary | ICD-10-CM | POA: Diagnosis present

## 2016-05-25 DIAGNOSIS — Z4659 Encounter for fitting and adjustment of other gastrointestinal appliance and device: Secondary | ICD-10-CM

## 2016-05-25 DIAGNOSIS — I5021 Acute systolic (congestive) heart failure: Secondary | ICD-10-CM | POA: Diagnosis not present

## 2016-05-25 DIAGNOSIS — Z833 Family history of diabetes mellitus: Secondary | ICD-10-CM

## 2016-05-25 DIAGNOSIS — Z9289 Personal history of other medical treatment: Secondary | ICD-10-CM

## 2016-05-25 DIAGNOSIS — Z978 Presence of other specified devices: Secondary | ICD-10-CM

## 2016-05-25 DIAGNOSIS — F325 Major depressive disorder, single episode, in full remission: Secondary | ICD-10-CM

## 2016-05-25 DIAGNOSIS — Z01818 Encounter for other preprocedural examination: Secondary | ICD-10-CM

## 2016-05-25 HISTORY — DX: Hypertensive heart disease with heart failure: I11.0

## 2016-05-25 LAB — COMPREHENSIVE METABOLIC PANEL
ALT: 24 U/L (ref 14–54)
AST: 34 U/L (ref 15–41)
Albumin: 2.8 g/dL — ABNORMAL LOW (ref 3.5–5.0)
Alkaline Phosphatase: 102 U/L (ref 38–126)
Anion gap: 11 (ref 5–15)
BUN: 21 mg/dL — ABNORMAL HIGH (ref 6–20)
CO2: 32 mmol/L (ref 22–32)
Calcium: 9 mg/dL (ref 8.9–10.3)
Chloride: 97 mmol/L — ABNORMAL LOW (ref 101–111)
Creatinine, Ser: 1.35 mg/dL — ABNORMAL HIGH (ref 0.44–1.00)
GFR calc Af Amer: 58 mL/min — ABNORMAL LOW (ref >60)
GFR calc non Af Amer: 50 mL/min — ABNORMAL LOW (ref >60)
Glucose, Bld: 121 mg/dL — ABNORMAL HIGH (ref 65–99)
Potassium: 4.1 mmol/L (ref 3.5–5.1)
Sodium: 140 mmol/L (ref 135–145)
Total Bilirubin: 2.3 mg/dL — ABNORMAL HIGH (ref 0.3–1.2)
Total Protein: 7.5 g/dL (ref 6.5–8.1)

## 2016-05-25 LAB — I-STAT BETA HCG BLOOD, ED (MC, WL, AP ONLY): I-stat hCG, quantitative: <5 m[IU]/mL (ref <5)

## 2016-05-25 LAB — URINALYSIS, ROUTINE W REFLEX MICROSCOPIC
Bilirubin Urine: NEGATIVE
Glucose, UA: NEGATIVE mg/dL
Hgb urine dipstick: NEGATIVE
Ketones, ur: NEGATIVE mg/dL
Leukocytes, UA: NEGATIVE
Nitrite: NEGATIVE
Protein, ur: NEGATIVE mg/dL
Specific Gravity, Urine: 1.012 (ref 1.005–1.030)
pH: 5 (ref 5.0–8.0)

## 2016-05-25 LAB — CBC WITH DIFFERENTIAL/PLATELET
Basophils Absolute: 0 10*3/uL (ref 0.0–0.1)
Basophils Relative: 0 %
Eosinophils Absolute: 0.1 10*3/uL (ref 0.0–0.7)
Eosinophils Relative: 1 %
HCT: 47.9 % — ABNORMAL HIGH (ref 36.0–46.0)
Hemoglobin: 14.3 g/dL (ref 12.0–15.0)
Lymphocytes Relative: 5 %
Lymphs Abs: 0.5 10*3/uL — ABNORMAL LOW (ref 0.7–4.0)
MCH: 24.1 pg — ABNORMAL LOW (ref 26.0–34.0)
MCHC: 29.9 g/dL — ABNORMAL LOW (ref 30.0–36.0)
MCV: 80.8 fL (ref 78.0–100.0)
Monocytes Absolute: 0.9 10*3/uL (ref 0.1–1.0)
Monocytes Relative: 9 %
Neutro Abs: 8.3 10*3/uL — ABNORMAL HIGH (ref 1.7–7.7)
Neutrophils Relative %: 85 %
Platelets: 191 10*3/uL (ref 150–400)
RBC: 5.93 MIL/uL — ABNORMAL HIGH (ref 3.87–5.11)
RDW: 21.1 % — ABNORMAL HIGH (ref 11.5–15.5)
WBC: 9.8 10*3/uL (ref 4.0–10.5)

## 2016-05-25 LAB — BRAIN NATRIURETIC PEPTIDE: B Natriuretic Peptide: 594.3 pg/mL — ABNORMAL HIGH (ref 0.0–100.0)

## 2016-05-25 LAB — I-STAT TROPONIN, ED: Troponin i, poc: 0.04 ng/mL (ref 0.00–0.08)

## 2016-05-25 LAB — D-DIMER, QUANTITATIVE: D-Dimer, Quant: 2.59 ug/mL-FEU — ABNORMAL HIGH (ref 0.00–0.50)

## 2016-05-25 MED ORDER — ACETAMINOPHEN 650 MG RE SUPP: 650.0000 mg | Freq: Four times a day (QID) | RECTAL | Status: DC | PRN

## 2016-05-25 MED ORDER — ONDANSETRON HCL 4 MG PO TABS: 4.0000 mg | ORAL_TABLET | Freq: Four times a day (QID) | ORAL | Status: DC | PRN

## 2016-05-25 MED ORDER — FUROSEMIDE 10 MG/ML IJ SOLN
60.0000 mg | Freq: Once | INTRAMUSCULAR | Status: AC
  Administered 2016-05-25: 60 mg via INTRAVENOUS
  Filled 2016-05-25: qty 6

## 2016-05-25 MED ORDER — SODIUM CHLORIDE 0.9% FLUSH
3.0000 mL | Freq: Two times a day (BID) | INTRAVENOUS | Status: DC
  Administered 2016-05-25 – 2016-06-14 (×38): 3 mL via INTRAVENOUS

## 2016-05-25 MED ORDER — ONDANSETRON HCL 4 MG/2ML IJ SOLN: 4.0000 mg | Freq: Four times a day (QID) | INTRAMUSCULAR | Status: DC | PRN

## 2016-05-25 MED ORDER — POTASSIUM CHLORIDE CRYS ER 20 MEQ PO TBCR
20.0000 meq | EXTENDED_RELEASE_TABLET | Freq: Every day | ORAL | Status: DC
Start: 2016-05-26 — End: 2016-05-28
  Administered 2016-05-26 – 2016-05-27 (×2): 20 meq via ORAL
  Filled 2016-05-25 (×3): qty 1

## 2016-05-25 MED ORDER — NITROGLYCERIN 2 % TD OINT
1.0000 [in_us] | TOPICAL_OINTMENT | Freq: Four times a day (QID) | TRANSDERMAL | Status: DC
  Administered 2016-05-26 – 2016-05-27 (×6): 1 [in_us] via TOPICAL
  Filled 2016-05-25 (×2): qty 30

## 2016-05-25 MED ORDER — ENOXAPARIN SODIUM 40 MG/0.4ML ~~LOC~~ SOLN
40.0000 mg | SUBCUTANEOUS | Status: DC
  Administered 2016-05-25: 40 mg via SUBCUTANEOUS
  Filled 2016-05-25: qty 0.4

## 2016-05-25 MED ORDER — NITROGLYCERIN 2 % TD OINT
1.0000 [in_us] | TOPICAL_OINTMENT | Freq: Once | TRANSDERMAL | Status: AC
  Administered 2016-05-25: 1 [in_us] via TOPICAL
  Filled 2016-05-25: qty 1

## 2016-05-25 MED ORDER — HYDRALAZINE HCL 20 MG/ML IJ SOLN
10.0000 mg | Freq: Four times a day (QID) | INTRAMUSCULAR | Status: DC | PRN
  Filled 2016-05-25: qty 1

## 2016-05-25 MED ORDER — FUROSEMIDE 10 MG/ML IJ SOLN
40.0000 mg | Freq: Two times a day (BID) | INTRAMUSCULAR | Status: DC
  Administered 2016-05-25 – 2016-05-27 (×4): 40 mg via INTRAVENOUS
  Filled 2016-05-25 (×4): qty 4

## 2016-05-25 MED ORDER — LABETALOL HCL 200 MG PO TABS
200.0000 mg | ORAL_TABLET | Freq: Two times a day (BID) | ORAL | Status: DC
  Administered 2016-05-25 – 2016-05-27 (×4): 200 mg via ORAL
  Filled 2016-05-25 (×5): qty 1

## 2016-05-25 MED ORDER — IOPAMIDOL (ISOVUE-370) INJECTION 76%
INTRAVENOUS | Status: AC
Start: 2016-05-25 — End: 2016-05-25
  Administered 2016-05-25: 80 mL
  Filled 2016-05-25: qty 100

## 2016-05-25 MED ORDER — ACETAMINOPHEN 325 MG PO TABS: 650.0000 mg | ORAL_TABLET | Freq: Four times a day (QID) | ORAL | Status: DC | PRN

## 2016-05-25 NOTE — ED Notes (Signed)
Walked patient to the bathroom

## 2016-05-25 NOTE — ED Provider Notes (Signed)
[facility] DEPT Provider Note   CSN: [id number] Arrival date & time: 05/25/16  1133     History   Chief Complaint Chief Complaint  Patient presents with  . Abdominal Pain    "bloating"    HPI [name] [name] is a 35 y.o. female.  HPI [name] [name] is a 35 y.o. female with history of diabetes, hypertension, morbid obesity, presents to emergency department complaining of abdominal distention, bloating, shortness of breath, extremity swelling. Patient states that she has had swelling in her extremities for some time. She reports worsening abdominal swelling in the last few weeks. She states "I feel like my abdomen is bloated." She reports shortness of breath on exertion. She denies any chest pain. She states she is having some cough. She denies any fever or chills. She states she has been off of her medications for over a year because "I am hard headed." She reports that her legs are cost less swollen and now weeping some fluids. She reports that she has had elevated blood pressure, states has not taken her blood pressure medication in a year. She states that she unable to lay down flat, states she sleeps on several pillows. She made an appointment with her PCP and 2 weeks, but states that she feels like she needs to be seen sooner. Patient denies any urinary symptoms. Still having normal bowel movements. No other complaints.  Past Medical History:  Diagnosis Date  . Diabetes mellitus without complication (HCC)    GDM  . Gestational diabetes mellitus in pregnancy 05/24/2013  . HTN in pregnancy, chronic 05/24/2013  . Hypertension   . Morbid obesity ([place])   . Sinus tachycardia     Patient Active Problem List   Diagnosis Date Noted  . Oligohydramnios, delivered 05/27/2013  . NSVD (normal spontaneous vaginal delivery) 05/27/2013  . Pregnancy 11/02/2012  . Sleep apnea 07/09/2011  . 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 Morbid obesity ([place]) 05/18/2011    History reviewed. No  pertinent surgical history.  OB History    Gravida Para Term Preterm AB Living   _0 0 0 1   SAB TAB Ectopic Multiple Live Births   0 0 0 0 1       Home Medications    Prior to Admission medications   Medication Sig Start Date End Date Taking? Authorizing Provider  ibuprofen (ADVIL,MOTRIN) 600 MG tablet Take 600 mg by mouth every 6 (six) hours as needed for mild pain.    Historical Provider, MD  ibuprofen (ADVIL,MOTRIN) 800 MG tablet Take 1 tablet (800 mg total) by mouth 3 (three) times daily. 12/13/13   Robyn M Hess, PA-C  labetalol (NORMODYNE) 200 MG tablet Take 1 tablet (200 mg total) by mouth 2 (two) times daily. 05/28/13   Paula Compton, MD  labetalol (NORMODYNE) 200 MG tablet Take 2 tablets PO twice daily. 12/13/13   Robyn M Hess, PA-C  NIFEdipine (PROCARDIA XL/ADALAT-CC) 60 MG 24 hr tablet Take 120 mg by mouth daily.    Historical Provider, MD    Family History Family History  Problem Relation Age of Onset  . Hypertension Mother   . Diabetes Other     Social History Social History  Substance Use Topics  . Smoking status: Never Smoker  . Smokeless tobacco: Never Used  . Alcohol use No     Allergies   Review of patient's allergies indicates no known allergies.   Review of Systems Review of Systems  Constitutional: Negative for chills and fever.  HENT:  Negative.   Respiratory: Positive for cough and shortness of breath. Negative for chest tightness.   Cardiovascular: Positive for leg swelling. Negative for chest pain and palpitations.  Gastrointestinal: Positive for abdominal distention. Negative for abdominal pain, diarrhea, nausea and vomiting.  Genitourinary: Negative for dysuria, flank pain, pelvic pain, vaginal bleeding, vaginal discharge and vaginal pain.  Musculoskeletal: Negative for arthralgias, myalgias, neck pain and neck stiffness.  Skin: Negative for rash.  Neurological: Negative for dizziness, weakness and headaches.  All other systems reviewed  and are negative.    Physical Exam Updated Vital Signs BP (!) 191/150   Pulse 113   Temp 97.9 F (36.6 C) (Oral)   Resp 20   Wt (!) 178 kg   SpO2 (!) 85%   BMI 63.35 kg/m   Physical Exam  Constitutional: She appears well-developed and well-nourished. No distress.  HENT:  Head: Normocephalic.  Eyes: Conjunctivae are normal.  Neck: Neck supple.  Cardiovascular: Normal rate, regular rhythm and normal heart sounds.   Pulmonary/Chest: No respiratory distress. She has no wheezes. She has no rales.  Breath sounds are distant bilaterally  Abdominal: Soft. Bowel sounds are normal. She exhibits no distension. There is no tenderness. There is no rebound.  Musculoskeletal: She exhibits edema.  3+ bilateral lower extremity edema extending all the way to the abdomen. There is some weeping from bilateral anterior shins.  Neurological: She is alert.  Skin: Skin is warm and dry.  Psychiatric: She has a normal mood and affect. Her behavior is normal.  Nursing note and vitals reviewed.    ED Treatments / Results  Labs (all labs ordered are listed, but only abnormal results are displayed) Labs Reviewed  CBC WITH DIFFERENTIAL/PLATELET - Abnormal; Notable for the following:       Result Value   RBC 5.93 (*)    HCT 47.9 (*)    MCH 24.1 (*)    MCHC 29.9 (*)    RDW 21.1 (*)    Neutro Abs 8.3 (*)    Lymphs Abs 0.5 (*)    All other components within normal limits  COMPREHENSIVE METABOLIC PANEL - Abnormal; Notable for the following:    Chloride 97 (*)    Glucose, Bld 121 (*)    BUN 21 (*)    Creatinine, Ser 1.35 (*)    Albumin 2.8 (*)    Total Bilirubin 2.3 (*)    GFR calc non Af Amer 50 (*)    GFR calc Af Amer 58 (*)    All other components within normal limits  BRAIN NATRIURETIC PEPTIDE - Abnormal; Notable for the following:    B Natriuretic Peptide 594.3 (*)    All other components within normal limits  D-DIMER, QUANTITATIVE (NOT AT [facility]) - Abnormal; Notable for the following:      D-Dimer, Quant 2.59 (*)    All other components within normal limits  URINALYSIS, ROUTINE W REFLEX MICROSCOPIC (NOT AT [facility])  BASIC METABOLIC PANEL  I-STAT TROPOININ, ED  I-STAT BETA HCG BLOOD, ED ([facility], [facility], [facility] ONLY)    EKG  EKG Interpretation  Date/Time:  Tuesday May 25 2016 11:59:08 EDT Ventricular Rate:  119 PR Interval:    QRS Duration: 99 QT Interval:  323 QTC Calculation: 455 R Axis:   147 Text Interpretation:  Sinus tachycardia Atrial premature complex Probable left atrial enlargement Low voltage with right axis deviation Probable anteroseptal infarct, old lower voltages compared to previous but no other significant changes Confirmed by LITTLE MD, RACHEL [phone]) on 05/25/2016  12:42:36 PM       Radiology Dg Chest 2 View  Result Date: 05/25/2016 CLINICAL DATA:  Chest pain and shortness of breath, 2 days duration. EXAM: CHEST  2 VIEW COMPARISON:  None. FINDINGS: Cardiac silhouette is markedly enlarged consistent with cardiomegaly and/or pericardial fluid. There is pulmonary venous hypertension with interstitial and alveolar pulmonary edema. No pleural effusion at this time. Bony structures are unremarkable. IMPRESSION: Enlarged cardiac silhouette consistent with cardiomegaly and/or pericardial fluid. Congestive heart failure with interstitial and alveolar edema. Electronically Signed   By: Nelson Chimes M.D.   On: 05/25/2016 13:14   Ct Angio Chest Pe W And/or Wo Contrast  Result Date: 05/25/2016 CLINICAL DATA:  Shortness of Breath and elevated D-dimer EXAM: CT ANGIOGRAPHY CHEST WITH CONTRAST TECHNIQUE: Multidetector CT imaging of the chest was performed using the standard protocol during bolus administration of intravenous contrast. Multiplanar CT image reconstructions and MIPs were obtained to evaluate the vascular anatomy. CONTRAST:  80 mL Isovue 370 nonionic COMPARISON:  Chest radiograph May 25, 2016 FINDINGS: Cardiovascular: There is no central vessel pulmonary embolus.  Due to attenuation artifact due to the patient's large size, assessment of the peripheral pulmonary arterial vessels is less than optimal. There is no obvious pulmonary embolus more distally allowing for a degree of attenuation artifact which is symmetric bilaterally. There is no demonstrable thoracic aortic aneurysm or dissection. The main pulmonary outflow tract measures 4.9 cm which is felt to be compatible with a degree of pulmonary arterial hypertension. Visualized great vessels appear unremarkable. There is cardiomegaly. There is pericardial fluid along the right side of the heart laterally. Mediastinum/Nodes: Thyroid appears unremarkable. There is no appreciable thoracic adenopathy. Lungs/Pleura: There is pulmonary edema throughout the lungs bilaterally, most pronounced in the perihilar and lower lobe regions but also moderate in the inferior lingula and right middle lobe regions. There is no well-defined airspace consolidation. There are no appreciable pleural effusions. Upper Abdomen: There is reflux of contrast into the inferior vena cava and pulmonary veins. Visualized upper abdominal structures otherwise appear unremarkable. Musculoskeletal: There is degenerative change in the thoracic spine with diffuse idiopathic skeletal hyperostosis. No blastic or lytic bone lesions are evident. Review of the MIP images confirms the above findings. IMPRESSION: No pulmonary embolus demonstrable. Visualization of the peripheral pulmonary arteries is less than optimal due to attenuation artifact from patient's large size. Main pulmonary outflow tract enlargement consistent with pulmonary arterial hypertension. Cardiomegaly with pulmonary edema consistent with congestive heart failure. Small pericardial effusion in the right heart region laterally. No pleural effusions evident. No adenopathy. Reflux of contrast in the inferior vena cava and hepatic veins is felt to be indicative of increased right heart pressure. Diffuse  idiopathic skeletal hyperostosis in the thoracic spine. Electronically Signed   By: Lowella Grip III M.D.   On: 05/25/2016 14:55    Procedures Procedures (including critical care time)  Medications Ordered in ED Medications  furosemide (LASIX) injection 60 mg (not administered)     Initial Impression / Assessment and Plan / ED Course  I have reviewed the triage vital signs and the nursing notes.  Pertinent labs & imaging results that were available during my care of the patient were reviewed by me and considered in my medical decision making (see chart for details).  Clinical Course    Patient seen and examined. Patient severely fluid overloaded. Patient was found to have oxygen saturation in the 70s with good waveform up front. She was placed on nasal cannula, oxygen came up  to 90. Patient reports exertional shortness of breath and orthopnea. Patient has long standing history of malignant hypertension, she is noncompliant. Blood pressure elevated here. Echo reviewed from 2012, at that time ejection fraction is 50-55% with no significant heart failure. Will check labs, chest x-ray, will get a d-dimer.  Patient's blood pressure remains high. She received 60 mg of Lasix IV. Nitropaste was applied. Patient'd d-dimer elevated, CT imaging was obtained which showed no evidence of pulmonary embolism however did show pulmonary edema. She continues to be hypoxic with oxygen, she remains in mid 90s on 3 L nasal cannula. She does not appear to be in any distress. I discussed patient with cardiology, they will come by and see her. I offered him admission, they will decide after they see patient.  Pt seen by cardiology, they asked for medicine to admit. They have added orders for hydralazine, labetalol, more lasix. Pt's HR did spike into 150s after receiving medicaions. Repeat ECG was obtained showing sinus tach. Will continue to monitor.   6:50 PM Pt's BP 160/97 after all of medications. HR  normal. Spoke with medicine, will admit.   Vitals:   05/25/16 1745 05/25/16 1800 05/25/16 1815 05/25/16 1830  BP: (!) 193/125 (!) 162/105 (!) 159/106 160/97  Pulse:  101 98 94  Resp: _0 Temp:      TempSrc:      SpO2:  92% 92% (!) 85%  Weight:         Final Clinical Impressions(s) / ED Diagnoses   Final diagnoses:  Acute congestive heart failure, unspecified congestive heart failure type [facility])    New Prescriptions New Prescriptions   No medications on file     Jeannett Senior, PA-C 05/25/16 [place], MD 05/29/16 1719

## 2016-05-25 NOTE — ED Notes (Signed)
Report attempted unsuccessful  Will call me back

## 2016-05-25 NOTE — ED Triage Notes (Signed)
Pt states she has been feeling bloated in her abd for over a week. Pt states she has appointment to see her MD about this on the 13th, but feels like she needs to be seen sooner. Denies SOB. Pt states her abd feels heavy and has "pressure." Pt denies constipation.

## 2016-05-25 NOTE — ED Notes (Signed)
Pt. O2 sats in the 70's, applied 3L O2, Sats came up to 99%

## 2016-05-25 NOTE — ED Triage Notes (Signed)
Pt states she has not taken her BP medications in over a year. BP in triage 215/151

## 2016-05-25 NOTE — Consult Note (Signed)
Reason for Consult:   CHF  Requesting Physician: ED Primary Cardiologist Dr Stanford Breed  HPI:   35 y/o morbidly obese (59 lbs)-BMI 18, works full time as a Presenter, broadcasting. She has a history of HTN. She had seen Dr Stanford Breed a few years ago. A sleep study was done but she says she never heard the results. Echo done 2012 showed her EF to be 50-55%. She has not taken medications for about a year-"couldn't afford them". She presented to the ED today with complaints of Rt lower quadrant pain. She was found to be markedly HTN. Her work up included a D-dimer which came back elevated. A chest CTA showed CHF (no PE). BNP was 594. She admits she is SOB with exertion but attributed this to "being out of shape". She denies orthopnea, sleeps in bed with pillows.    PMHx:  Past Medical History:  Diagnosis Date  . Diabetes mellitus without complication (HCC)    GDM  . Gestational diabetes mellitus in pregnancy 05/24/2013  . HTN in pregnancy, chronic 05/24/2013  . Hypertension   . Morbid obesity ([place])   . Sinus tachycardia     History reviewed. No pertinent surgical history.  SOCHx:  reports that she has never smoked. She has never used smokeless tobacco. She reports that she does not drink alcohol or use drugs.  FAMHx: Family History  Problem Relation Age of Onset  . Hypertension Mother   . Diabetes Other     ALLERGIES: No Known Allergies  ROS: Review of Systems: General: negative for chills, fever, night sweats or weight changes.  Cardiovascular: negative for chest pain, orthopnea, palpitations, paroxysmal nocturnal dyspnea or shortness of breath HEENT: negative for any visual disturbances, blindness, glaucoma Dermatological: negative for rash Respiratory: negative for cough, hemoptysis, or wheezing Urologic: negative for hematuria or dysuria Abdominal: denies red blood per rectum, melena, or hematemesis Neurologic: negative for visual changes, syncope, or  dizziness Musculoskeletal: negative for back pain, joint pain, or swelling Psych: cooperative and appropriate All other systems reviewed and are otherwise negative except as noted above.   HOME MEDICATIONS: Prior to Admission medications   Medication Sig Start Date End Date Taking? Authorizing Provider  ibuprofen (ADVIL,MOTRIN) 200 MG tablet Take 600 mg by mouth every 6 (six) hours as needed for mild pain.   Yes Historical Provider, MD  ibuprofen (ADVIL,MOTRIN) 800 MG tablet Take 1 tablet (800 mg total) by mouth 3 (three) times daily. Patient not taking: Reported on 05/25/2016 12/13/13   Carman Ching, PA-C  labetalol (NORMODYNE) 200 MG tablet Take 1 tablet (200 mg total) by mouth 2 (two) times daily. Patient not taking: Reported on 05/25/2016 05/28/13   Paula Compton, MD  labetalol (NORMODYNE) 200 MG tablet Take 2 tablets PO twice daily. Patient not taking: Reported on 05/25/2016 12/13/13   Hessie Diener Hess, PA-C  NIFEdipine (PROCARDIA XL/ADALAT-CC) 60 MG 24 hr tablet Take 120 mg by mouth daily.    Historical Provider, MD    HOSPITAL MEDICATIONS: I have reviewed the patient's current medications.  VITALS: Blood pressure (!) 204/122, pulse 103, temperature 97.9 F (36.6 C), temperature source Oral, resp. rate (!) 28, weight (!) 392 lb 8 oz (178 kg), SpO2 96 %, unknown if currently breastfeeding.  PHYSICAL EXAM: General appearance: alert, cooperative, no distress and super morbid obesity Neck: no carotid bruit and unable to tell JVD Lungs: decreased breath sounds Heart: regular rate and rhythm Abdomen: morbid obesity Extremities: 1+ edema Pulses:  2+ and symmetric Skin: Skin color, texture, turgor normal. No rashes or lesions Neurologic: Grossly normal  LABS: Results for orders placed or performed during the hospital encounter of 05/25/16 (from the past 24 hour(s))  CBC with Differential     Status: Abnormal   Collection Time: 05/25/16 12:40 PM  Result Value Ref Range   WBC 9.8 4.0 -  10.5 K/uL   RBC 5.93 (H) 3.87 - 5.11 MIL/uL   Hemoglobin 14.3 12.0 - 15.0 g/dL   HCT 47.9 (H) 36.0 - 46.0 %   MCV 80.8 78.0 - 100.0 fL   MCH 24.1 (L) 26.0 - 34.0 pg   MCHC 29.9 (L) 30.0 - 36.0 g/dL   RDW 21.1 (H) 11.5 - 15.5 %   Platelets 191 150 - 400 K/uL   Neutrophils Relative % 85 %   Lymphocytes Relative 5 %   Monocytes Relative 9 %   Eosinophils Relative 1 %   Basophils Relative 0 %   Neutro Abs 8.3 (H) 1.7 - 7.7 K/uL   Lymphs Abs 0.5 (L) 0.7 - 4.0 K/uL   Monocytes Absolute 0.9 0.1 - 1.0 K/uL   Eosinophils Absolute 0.1 0.0 - 0.7 K/uL   Basophils Absolute 0.0 0.0 - 0.1 K/uL   RBC Morphology TARGET CELLS    Smear Review LARGE PLATELETS PRESENT   Comprehensive metabolic panel     Status: Abnormal   Collection Time: 05/25/16 12:40 PM  Result Value Ref Range   Sodium 140 135 - 145 mmol/L   Potassium 4.1 3.5 - 5.1 mmol/L   Chloride 97 (L) 101 - 111 mmol/L   CO2 32 22 - 32 mmol/L   Glucose, Bld 121 (H) 65 - 99 mg/dL   BUN 21 (H) 6 - 20 mg/dL   Creatinine, Ser 1.35 (H) 0.44 - 1.00 mg/dL   Calcium 9.0 8.9 - 10.3 mg/dL   Total Protein 7.5 6.5 - 8.1 g/dL   Albumin 2.8 (L) 3.5 - 5.0 g/dL   AST 34 15 - 41 U/L   ALT 24 14 - 54 U/L   Alkaline Phosphatase 102 38 - 126 U/L   Total Bilirubin 2.3 (H) 0.3 - 1.2 mg/dL   GFR calc non Af Amer 50 (L) >60 mL/min   GFR calc Af Amer 58 (L) >60 mL/min   Anion gap 11 5 - 15  Brain natriuretic peptide     Status: Abnormal   Collection Time: 05/25/16 12:40 PM  Result Value Ref Range   B Natriuretic Peptide 594.3 (H) 0.0 - 100.0 pg/mL  D-dimer, quantitative (not at [facility])     Status: Abnormal   Collection Time: 05/25/16 12:40 PM  Result Value Ref Range   D-Dimer, Quant 2.59 (H) 0.00 - 0.50 ug/mL-FEU  I-Stat Troponin, ED (not at [facility])     Status: None   Collection Time: 05/25/16  1:07 PM  Result Value Ref Range   Troponin i, poc 0.04 0.00 - 0.08 ng/mL   Comment 3          I-Stat Beta hCG blood, ED ([facility], [facility], [facility] only)     Status: None    Collection Time: 05/25/16  1:07 PM  Result Value Ref Range   I-stat hCG, quantitative <5.0 <5 mIU/mL   Comment 3          Urinalysis, Routine w reflex microscopic (not at [facility])     Status: None   Collection Time: 05/25/16  2:10 PM  Result Value Ref Range   Color, Urine YELLOW  YELLOW   APPearance CLEAR CLEAR   Specific Gravity, Urine 1.012 1.005 - 1.030   pH 5.0 5.0 - 8.0   Glucose, UA NEGATIVE NEGATIVE mg/dL   Hgb urine dipstick NEGATIVE NEGATIVE   Bilirubin Urine NEGATIVE NEGATIVE   Ketones, ur NEGATIVE NEGATIVE mg/dL   Protein, ur NEGATIVE NEGATIVE mg/dL   Nitrite NEGATIVE NEGATIVE   Leukocytes, UA NEGATIVE NEGATIVE    EKG: NSR, poor anterior RW secondary to body habitus  IMAGING: Dg Chest 2 View  Result Date: 05/25/2016 CLINICAL DATA:  Chest pain and shortness of breath, 2 days duration. EXAM: CHEST  2 VIEW COMPARISON:  None. FINDINGS: Cardiac silhouette is markedly enlarged consistent with cardiomegaly and/or pericardial fluid. There is pulmonary venous hypertension with interstitial and alveolar pulmonary edema. No pleural effusion at this time. Bony structures are unremarkable. IMPRESSION: Enlarged cardiac silhouette consistent with cardiomegaly and/or pericardial fluid. Congestive heart failure with interstitial and alveolar edema. Electronically Signed   By: Nelson Chimes M.D.   On: 05/25/2016 13:14   Ct Angio Chest Pe W And/or Wo Contrast  Result Date: 05/25/2016 CLINICAL DATA:  Shortness of Breath and elevated D-dimer EXAM: CT ANGIOGRAPHY CHEST WITH CONTRAST TECHNIQUE: Multidetector CT imaging of the chest was performed using the standard protocol during bolus administration of intravenous contrast. Multiplanar CT image reconstructions and MIPs were obtained to evaluate the vascular anatomy. CONTRAST:  80 mL Isovue 370 nonionic COMPARISON:  Chest radiograph May 25, 2016 FINDINGS: Cardiovascular: There is no central vessel pulmonary embolus. Due to attenuation artifact due to  the patient's large size, assessment of the peripheral pulmonary arterial vessels is less than optimal. There is no obvious pulmonary embolus more distally allowing for a degree of attenuation artifact which is symmetric bilaterally. There is no demonstrable thoracic aortic aneurysm or dissection. The main pulmonary outflow tract measures 4.9 cm which is felt to be compatible with a degree of pulmonary arterial hypertension. Visualized great vessels appear unremarkable. There is cardiomegaly. There is pericardial fluid along the right side of the heart laterally. Mediastinum/Nodes: Thyroid appears unremarkable. There is no appreciable thoracic adenopathy. Lungs/Pleura: There is pulmonary edema throughout the lungs bilaterally, most pronounced in the perihilar and lower lobe regions but also moderate in the inferior lingula and right middle lobe regions. There is no well-defined airspace consolidation. There are no appreciable pleural effusions. Upper Abdomen: There is reflux of contrast into the inferior vena cava and pulmonary veins. Visualized upper abdominal structures otherwise appear unremarkable. Musculoskeletal: There is degenerative change in the thoracic spine with diffuse idiopathic skeletal hyperostosis. No blastic or lytic bone lesions are evident. Review of the MIP images confirms the above findings. IMPRESSION: No pulmonary embolus demonstrable. Visualization of the peripheral pulmonary arteries is less than optimal due to attenuation artifact from patient's large size. Main pulmonary outflow tract enlargement consistent with pulmonary arterial hypertension. Cardiomegaly with pulmonary edema consistent with congestive heart failure. Small pericardial effusion in the right heart region laterally. No pleural effusions evident. No adenopathy. Reflux of contrast in the inferior vena cava and hepatic veins is felt to be indicative of increased right heart pressure. Diffuse idiopathic skeletal hyperostosis  in the thoracic spine. Electronically Signed   By: Lowella Grip III M.D.   On: 05/25/2016 14:55    IMPRESSION: Principal Problem:   Abdominal pain Active Problems:   Malignant hypertension   Acute pulmonary edema (HCC)   Renal insufficiency   Morbid obesity (HCC)   Sleep apnea   Abnormal EKG   RECOMMENDATION: Lasix started  in ED-continue 40 mg IV BID. Add Hydralazine, resume Labetalol. Hesitant to add ACE or ARB at this time with renal insufficiency. We will follow. Check echo tomorrow.   Time Spent Directly with Patient: 66 minutes  Kerin Ransom, Terry beeper 05/25/2016, 4:34 PM   Personally seen and examined. Agree with above.  35 year old with super morbid obesity, hypertension, elevated BNP  AAO x 3, distant BS, RRR, obese  - Lasix 40 IV BID  - ECHO  - Labetalol  - Hydralazine  - As fluid is reduced, hopefully BP will follow.   - BP control per primary team. Will assist/ follow.   Candee Furbish, MD

## 2016-05-25 NOTE — H&P (Signed)
History and Physical    [name] [name] LIY:[id number] DOB: Aug 16, 1981 DOA: 05/25/2016  PCP: No PCP Per Patient   Patient coming from: Home  Chief Complaint: DOE, swelling, weight gain  HPI: [name] [name] is a 35 y.o.  Woman with a history uncontrolled hypertension, gestational diabetes, morbid obesity, and medical noncompliance who reports a 1-2 week history of progressive dyspnea on exertion and orthopnea with lower extremity and abdominal wall edema.  She does not know how much weight she has gained in the past 2-3 months, but she knows that her weight is up.  She has had noticeable increased abdominal girth.  She explicitly denies chest pain, pressure, or tightness.  No light-headedness or loss of consciousness.   She admits to noncompliance with recommended treatment for hypertension for YEARS.   ED Course: Chest imaging with multiple findings consistent with CHF.  Cardiology consulted from the ED and has seen the patient in consultation.  EKG showed a sinus tachycardia but no acute ST elevations. D-Dimer positive which prompted CTA of the chest; PE ruled out.  BNP elevated to almost 600.  First troponin 0.04.  She has mild renal insufficiency.  She has received anti-hypertensives (IV hydralazine, labetalol, nitro paste) and IV lasix to begin diuresis efforts.    Review of Systems: As per HPI otherwise 10 point review of systems negative.    Past Medical History:  Diagnosis Date  . Diabetes mellitus without complication (HCC)    GDM  . Gestational diabetes mellitus in pregnancy 05/24/2013  . HTN in pregnancy, chronic 05/24/2013  . Hypertension   . Morbid obesity ([place])   . Sinus tachycardia     History reviewed. No pertinent surgical history.  She denies any major surgeries.   reports that she has never smoked. She has never used smokeless tobacco. She reports that she does not drink alcohol or use drugs.  She is married.  She has one daughter.  No Known Allergies  Family  History  Problem Relation Age of Onset  . Hypertension Mother   . Diabetes Other   Maternal grandmother also had hypertension and DM.   Prior to Admission medications   Medication Sig Start Date End Date Taking? Authorizing Provider  ibuprofen (ADVIL,MOTRIN) 200 MG tablet Take 600 mg by mouth every 6 (six) hours as needed for mild pain.   Yes Historical Provider, MD    Physical Exam: Vitals:   05/25/16 1745 05/25/16 1800 05/25/16 1815 05/25/16 1830  BP: (!) 193/125 (!) 162/105 (!) 159/106 160/97  Pulse:  101 98 94  Resp: _0 Temp:      TempSrc:      SpO2:  92% 92% (!) 85%  Weight:          Constitutional: NAD, calm, comfortable, sitting on the edge of the bed Vitals:   05/25/16 1745 05/25/16 1800 05/25/16 1815 05/25/16 1830  BP: (!) 193/125 (!) 162/105 (!) 159/106 160/97  Pulse:  101 98 94  Resp: _1 Temp:      TempSrc:      SpO2:  92% 92% (!) 85%  Weight:       Eyes: PERRL, lids and conjunctiva normal ENMT: Mucous membranes are moist. Posterior pharynx clear of any exudate or lesions. Normal dentition.  Neck: normal appearance, thick but supple Respiratory: clear to auscultation bilaterally, no wheezing, no crackles. Normal respiratory effort. No accessory muscle use.  Cardiovascular: Distant heart sounds.  Mildly tachycardic but regular.  No murmurs /  rubs / gallops. 2+ pitting edema in her lower extremities and into her abdominal wall.  2+ pedal pulses.   GI: abdomen is super obese and protuberant.  No tenderness.  No masses palpated.  + abdominal wall edema.  Bowel sounds are present. Musculoskeletal:  No joint deformity in upper and lower extremities. Good ROM, no contractures. Normal muscle tone.  Skin: no rashes, warm and dry Neurologic: No focal deficits. Psychiatric: Normal judgment and insight. Alert and oriented x 3. Normal mood.     Labs on Admission: I have personally reviewed following labs and imaging studies  CBC:  Recent  Labs Lab 05/25/16 1240  WBC 9.8  NEUTROABS 8.3*  HGB 14.3  HCT 47.9*  MCV 80.8  PLT 638   Basic Metabolic Panel:  Recent Labs Lab 05/25/16 1240  NA 140  K 4.1  CL 97*  CO2 32  GLUCOSE 121*  BUN 21*  CREATININE 1.35*  CALCIUM 9.0   GFR: CrCl cannot be calculated (Unknown ideal weight.). Liver Function Tests:  Recent Labs Lab 05/25/16 1240  AST 34  ALT 24  ALKPHOS 102  BILITOT 2.3*  PROT 7.5  ALBUMIN 2.8*   Urine analysis:    Component Value Date/Time   COLORURINE YELLOW 05/25/2016 1410   APPEARANCEUR CLEAR 05/25/2016 1410   LABSPEC 1.012 05/25/2016 1410   PHURINE 5.0 05/25/2016 1410   GLUCOSEU NEGATIVE 05/25/2016 1410   HGBUR NEGATIVE 05/25/2016 1410   BILIRUBINUR NEGATIVE 05/25/2016 1410   KETONESUR NEGATIVE 05/25/2016 1410   PROTEINUR NEGATIVE 05/25/2016 1410   UROBILINOGEN 1.0 12/12/2013 2146   NITRITE NEGATIVE 05/25/2016 1410   LEUKOCYTESUR NEGATIVE 05/25/2016 1410    Radiological Exams on Admission: Dg Chest 2 View  Result Date: 05/25/2016 CLINICAL DATA:  Chest pain and shortness of breath, 2 days duration. EXAM: CHEST  2 VIEW COMPARISON:  None. FINDINGS: Cardiac silhouette is markedly enlarged consistent with cardiomegaly and/or pericardial fluid. There is pulmonary venous hypertension with interstitial and alveolar pulmonary edema. No pleural effusion at this time. Bony structures are unremarkable. IMPRESSION: Enlarged cardiac silhouette consistent with cardiomegaly and/or pericardial fluid. Congestive heart failure with interstitial and alveolar edema. Electronically Signed   By: Nelson Chimes M.D.   On: 05/25/2016 13:14   Ct Angio Chest Pe W And/or Wo Contrast  Result Date: 05/25/2016 CLINICAL DATA:  Shortness of Breath and elevated D-dimer EXAM: CT ANGIOGRAPHY CHEST WITH CONTRAST TECHNIQUE: Multidetector CT imaging of the chest was performed using the standard protocol during bolus administration of intravenous contrast. Multiplanar CT image  reconstructions and MIPs were obtained to evaluate the vascular anatomy. CONTRAST:  80 mL Isovue 370 nonionic COMPARISON:  Chest radiograph May 25, 2016 FINDINGS: Cardiovascular: There is no central vessel pulmonary embolus. Due to attenuation artifact due to the patient's large size, assessment of the peripheral pulmonary arterial vessels is less than optimal. There is no obvious pulmonary embolus more distally allowing for a degree of attenuation artifact which is symmetric bilaterally. There is no demonstrable thoracic aortic aneurysm or dissection. The main pulmonary outflow tract measures 4.9 cm which is felt to be compatible with a degree of pulmonary arterial hypertension. Visualized great vessels appear unremarkable. There is cardiomegaly. There is pericardial fluid along the right side of the heart laterally. Mediastinum/Nodes: Thyroid appears unremarkable. There is no appreciable thoracic adenopathy. Lungs/Pleura: There is pulmonary edema throughout the lungs bilaterally, most pronounced in the perihilar and lower lobe regions but also moderate in the inferior lingula and right middle lobe regions. There is no  well-defined airspace consolidation. There are no appreciable pleural effusions. Upper Abdomen: There is reflux of contrast into the inferior vena cava and pulmonary veins. Visualized upper abdominal structures otherwise appear unremarkable. Musculoskeletal: There is degenerative change in the thoracic spine with diffuse idiopathic skeletal hyperostosis. No blastic or lytic bone lesions are evident. Review of the MIP images confirms the above findings. IMPRESSION: No pulmonary embolus demonstrable. Visualization of the peripheral pulmonary arteries is less than optimal due to attenuation artifact from patient's large size. Main pulmonary outflow tract enlargement consistent with pulmonary arterial hypertension. Cardiomegaly with pulmonary edema consistent with congestive heart failure. Small  pericardial effusion in the right heart region laterally. No pleural effusions evident. No adenopathy. Reflux of contrast in the inferior vena cava and hepatic veins is felt to be indicative of increased right heart pressure. Diffuse idiopathic skeletal hyperostosis in the thoracic spine. Electronically Signed   By: Lowella Grip III M.D.   On: 05/25/2016 14:55    EKG: Independently reviewed. Noted above.  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 --Cardiology consult appreciated --Complete echo pending --Diuresis with IV lasix --Strict I/O, daily weights, 1500cc fluid restriction --Blood pressure control with nitropaste, labetalol, IV hydralazine prn for now --Serial troponin --Screen for DM, dyslipidemia --If significant reduction in EF on echo, she will ultimately need ischemic evaluation; defer to cardiology  Renal insufficiency --Follow trend with diuresis --She could certainly have mild CKD secondary to her HTN --Avoid nephrotoxic agents for now  DVT prophylaxis: Lovenox Code Status: FULL Family Communication: Sister present at bedside in the ED at time of admission Disposition Plan: To be determined Consults called: Cardiology Admission status: Inpatient, telemetry   TIME SPENT: 60 minutes   Eber Jones MD [facility] Pager [phone]  If 7PM-7AM, please contact night-coverage [email or web address] Password [email or web address]  05/25/2016, 7:43 PM

## 2016-05-26 ENCOUNTER — Inpatient Hospital Stay (HOSPITAL_COMMUNITY): Payer: PRIVATE HEALTH INSURANCE

## 2016-05-26 DIAGNOSIS — K761 Chronic passive congestion of liver: Secondary | ICD-10-CM

## 2016-05-26 DIAGNOSIS — I313 Pericardial effusion (noninflammatory): Secondary | ICD-10-CM

## 2016-05-26 DIAGNOSIS — I272 Pulmonary hypertension, unspecified: Secondary | ICD-10-CM

## 2016-05-26 DIAGNOSIS — I3139 Other pericardial effusion (noninflammatory): Secondary | ICD-10-CM

## 2016-05-26 DIAGNOSIS — I509 Heart failure, unspecified: Secondary | ICD-10-CM

## 2016-05-26 DIAGNOSIS — R000 Tachycardia, unspecified: Secondary | ICD-10-CM

## 2016-05-26 HISTORY — DX: Tachycardia, unspecified: R00.0

## 2016-05-26 LAB — LIPID PANEL
Cholesterol: 128 mg/dL (ref 0–200)
HDL: 20 mg/dL — ABNORMAL LOW (ref >40)
LDL Cholesterol: 92 mg/dL (ref 0–99)
Total CHOL/HDL Ratio: 6.4 RATIO
Triglycerides: 79 mg/dL (ref <150)
VLDL: 16 mg/dL (ref 0–40)

## 2016-05-26 LAB — BASIC METABOLIC PANEL
Anion gap: 8 (ref 5–15)
BUN: 27 mg/dL — ABNORMAL HIGH (ref 6–20)
CO2: 34 mmol/L — ABNORMAL HIGH (ref 22–32)
Calcium: 8.6 mg/dL — ABNORMAL LOW (ref 8.9–10.3)
Chloride: 98 mmol/L — ABNORMAL LOW (ref 101–111)
Creatinine, Ser: 1.41 mg/dL — ABNORMAL HIGH (ref 0.44–1.00)
GFR calc Af Amer: 55 mL/min — ABNORMAL LOW (ref >60)
GFR calc non Af Amer: 47 mL/min — ABNORMAL LOW (ref >60)
Glucose, Bld: 127 mg/dL — ABNORMAL HIGH (ref 65–99)
Potassium: 5.2 mmol/L — ABNORMAL HIGH (ref 3.5–5.1)
Sodium: 140 mmol/L (ref 135–145)

## 2016-05-26 LAB — TROPONIN I
Troponin I: 0.06 ng/mL (ref <0.03)
Troponin I: 0.1 ng/mL (ref <0.03)
Troponin I: 0.06 ng/mL (ref <0.03)

## 2016-05-26 LAB — PROTIME-INR
INR: 1.28
Prothrombin Time: 16.1 seconds — ABNORMAL HIGH (ref 11.4–15.2)

## 2016-05-26 LAB — APTT: aPTT: 32 seconds (ref 24–36)

## 2016-05-26 LAB — ECHOCARDIOGRAM COMPLETE
Height: 65 in
Weight: 7441.6 oz

## 2016-05-26 MED ORDER — PERFLUTREN LIPID MICROSPHERE
1.0000 mL | INTRAVENOUS | Status: AC | PRN
  Administered 2016-05-26: 2 mL via INTRAVENOUS
  Filled 2016-05-26: qty 10

## 2016-05-26 MED ORDER — ENOXAPARIN SODIUM 100 MG/ML ~~LOC~~ SOLN
100.0000 mg | SUBCUTANEOUS | Status: DC
  Administered 2016-05-26 – 2016-05-30 (×5): 100 mg via SUBCUTANEOUS
  Filled 2016-05-26 (×5): qty 1

## 2016-05-26 MED ORDER — PERFLUTREN LIPID MICROSPHERE
INTRAVENOUS | Status: AC
  Filled 2016-05-26: qty 10

## 2016-05-26 NOTE — Consult Note (Addendum)
[place] Nurse wound consult note Reason for Consult: BLE edema and weeping Wound type: edema BLE, weeping LLE Pressure Ulcer POA: No, not related to pressure Measurement: RLE 4cm x 2 cm x 0 cm                          LLE 7cm x 4cm x 0cm Wound bed: RLE area dry, LLE area weeping Drainage (amount, consistency, odor) mod, clear, no odor Periwound: intact, BLE swelling Dressing procedure/placement/frequency: I have provided nurses with orders for LLE cleanse with NS, pat dry, apply xeroform to pretibial weeping area, cover with ABD, wrap with kerlex, wrap with 4" elastic bandage (ace wrap). Change daily to assess leg and to rewrap for renewed compression. RLE cleanse with NS, pat dry, apply vaseline gauze to dry pretibial area, wrap with kerlex, wrap with 4" elastic bandage (ace wrap). Change daily to assess leg and to rewrap for renewed compression, I performed today.  We will not follow, but will remain available to this patient, to nursing, and the medical and/or surgical teams.  Please re-consult if we need to assist further.   Fara Olden, RN-C, WTA-C [facility]

## 2016-05-26 NOTE — Progress Notes (Signed)
Patient arrived to room 3E29 from [facility]. Alert and oriented X 4. No complaints of pain. VSS. Requires O2 [place] to maintain sats above 92%. Sinus tach on telemetry. +2./3 bilateral lower extremity weeping edema. Moderate fall risk.Oriented to room and call system.

## 2016-05-26 NOTE — Progress Notes (Signed)
  Echocardiogram 2D Echocardiogram with Definity has been performed.  Jennette Dubin 05/26/2016, 2:40 PM

## 2016-05-26 NOTE — Progress Notes (Signed)
PROGRESS NOTE    [name] [name]  NLG:[id number] DOB: 1980/08/25 DOA: 05/25/2016 PCP: No PCP Per Patient   Chief Complaint  Patient presents with  . Abdominal Pain    "bloating"     Brief Narrative:  HPI on 05/25/2016 by Dr. Lily Kocher [name] [name] is a 35 y.o.  Woman with a history uncontrolled hypertension, gestational diabetes, morbid obesity, and medical noncompliance who reports a 1-2 week history of progressive dyspnea on exertion and orthopnea with lower extremity and abdominal wall edema.  She does not know how much weight she has gained in the past 2-3 months, but she knows that her weight is up.  She has had noticeable increased abdominal girth.  She explicitly denies chest pain, pressure, or tightness.  No light-headedness or loss of consciousness.  She admits to noncompliance with recommended treatment for hypertension for YEARS.   Assessment & Plan   Acute CHF, ? Diastolic -Echocardiogram in 2012 showed an EF of 50-55% -reviewed Chest x-ray, consistent with CHF -BNP 594 -CTA chest show cardiomegaly with pulmonary edema, consistent with congestive heart failure. Small. Cardio effusion -Cardiology consulted and appreciated -Continue to monitor intake and output, daily weights -Continue IV Lasix -Echocardiogram pending  Renal insufficiency -Creatinine likely to worsen due to IV Lasix -Continue to monitor BMP closely  Pulmonary hypertension -Treatment plan as above -Noted on CT a chest, main pulmonary outflow tract origin consistent with pulmonary arterial hypertension  Morbid obesity -Upon discharge, patient will need to speak with her primary care physician regarding lifestyle modifications including diet and exercise -BMI 77  Elevated troponin -Likely secondary to demand ischemia or heart strain and failure -Troponin peak of 0.10, currently 0.06  Essential hypertension -Continue Lasix, labetalol  LE edema/wounds -Wound care consulted -Likely related to  the above  DVT Prophylaxis  lovenox  Code Status: full  Family Communication:  None at bedside  Disposition Plan: Admitted, pending echocardiogram  Consultants Cardiology  Procedures  None  Antibiotics   Anti-infectives    None      Subjective:   [name] seen and examined today.  Patient states her breathing is improved mildly. She would like to go home soon as today is her daughter's birthday. Patient currently denies any chest pain, cough, dizziness, headache, abdominal pain, nausea or vomiting, diarrhea or constipation.   Objective:   Vitals:   05/25/16 2247 05/26/16 0520 05/26/16 1109 05/26/16 1147  BP:  110/68 137/81 (!) 141/80  Pulse: 91 78  82  Resp:  20  20  Temp: 98.5 F (36.9 C) 98.4 F (36.9 C)  97.7 F (36.5 C)  TempSrc: Oral Oral  Oral  SpO2:  95%  92%  Weight: (!) 211.6 kg (466 lb 8 oz) (!) 211 kg (465 lb 1.6 oz)    Height: _0  (1.651 m)       Intake/Output Summary (Last 24 hours) at 05/26/16 1358 Last data filed at 05/26/16 1337  Gross per 24 hour  Intake              600 ml  Output                0 ml  Net              600 ml   Filed Weights   05/25/16 1140 05/25/16 2247 05/26/16 0520  Weight: (!) 178 kg (392 lb 8 oz) (!) 211.6 kg (466 lb 8 oz) (!) 211 kg (465 lb 1.6 oz)    Exam  General: Well developed, well nourished, NAD, appears stated age  HEENT: NCAT, PERRLA, EOMI, Anicteic Sclera, mucous membranes moist.   Neck: Supple, no masses  Cardiovascular: S1 S2 auscultated, Regular rate and rhythm.  Respiratory: Diminished breath sounds (difficult to auscultate due to body habitus)  Abdomen: Soft, obese, nontender, nondistended, + bowel sounds  Extremities: warm dry without cyanosis clubbing. LE edema, weeping  Neuro: AAOx3, nonfocal  Psych: Normal affect and demeanor with intact judgement and insight   Data Reviewed: I have personally reviewed following labs and imaging studies  CBC:  Recent Labs Lab  05/25/16 1240  WBC 9.8  NEUTROABS 8.3*  HGB 14.3  HCT 47.9*  MCV 80.8  PLT 628   Basic Metabolic Panel:  Recent Labs Lab 05/25/16 1240 05/26/16 0435  NA 140 140  K 4.1 5.2*  CL 97* 98*  CO2 32 34*  GLUCOSE 121* 127*  BUN 21* 27*  CREATININE 1.35* 1.41*  CALCIUM 9.0 8.6*   GFR: Estimated Creatinine Clearance: 103.3 mL/min (by C-G formula based on SCr of 1.41 mg/dL (H)). Liver Function Tests:  Recent Labs Lab 05/25/16 1240  AST 34  ALT 24  ALKPHOS 102  BILITOT 2.3*  PROT 7.5  ALBUMIN 2.8*   No results for input(s): LIPASE, AMYLASE in the last 168 hours. No results for input(s): AMMONIA in the last 168 hours. Coagulation Profile:  Recent Labs Lab 05/26/16 0435  INR 1.28   Cardiac Enzymes:  Recent Labs Lab 05/25/16 2305 05/26/16 0435 05/26/16 0946  TROPONINI 0.06* 0.10* 0.06*   BNP (last 3 results) No results for input(s): PROBNP in the last 8760 hours. HbA1C: No results for input(s): HGBA1C in the last 72 hours. CBG: No results for input(s): GLUCAP in the last 168 hours. Lipid Profile:  Recent Labs  05/26/16 0435  CHOL 128  HDL 20*  LDLCALC 92  TRIG 79  CHOLHDL 6.4   Thyroid Function Tests: No results for input(s): TSH, T4TOTAL, FREET4, T3FREE, THYROIDAB in the last 72 hours. Anemia Panel: No results for input(s): VITAMINB12, FOLATE, FERRITIN, TIBC, IRON, RETICCTPCT in the last 72 hours. Urine analysis:    Component Value Date/Time   COLORURINE YELLOW 05/25/2016 1410   APPEARANCEUR CLEAR 05/25/2016 1410   LABSPEC 1.012 05/25/2016 1410   PHURINE 5.0 05/25/2016 1410   GLUCOSEU NEGATIVE 05/25/2016 1410   HGBUR NEGATIVE 05/25/2016 1410   BILIRUBINUR NEGATIVE 05/25/2016 1410   KETONESUR NEGATIVE 05/25/2016 1410   PROTEINUR NEGATIVE 05/25/2016 1410   UROBILINOGEN 1.0 12/12/2013 2146   NITRITE NEGATIVE 05/25/2016 1410   LEUKOCYTESUR NEGATIVE 05/25/2016 1410   Sepsis Labs: _0 (procalcitonin:4,lacticidven:4)  )No results found  for this or any previous visit (from the past 240 hour(s)).    Radiology Studies: Dg Chest 2 View  Result Date: 05/25/2016 CLINICAL DATA:  Chest pain and shortness of breath, 2 days duration. EXAM: CHEST  2 VIEW COMPARISON:  None. FINDINGS: Cardiac silhouette is markedly enlarged consistent with cardiomegaly and/or pericardial fluid. There is pulmonary venous hypertension with interstitial and alveolar pulmonary edema. No pleural effusion at this time. Bony structures are unremarkable. IMPRESSION: Enlarged cardiac silhouette consistent with cardiomegaly and/or pericardial fluid. Congestive heart failure with interstitial and alveolar edema. Electronically Signed   By: Nelson Chimes M.D.   On: 05/25/2016 13:14   Ct Angio Chest Pe W And/or Wo Contrast  Result Date: 05/25/2016 CLINICAL DATA:  Shortness of Breath and elevated D-dimer EXAM: CT ANGIOGRAPHY CHEST WITH CONTRAST TECHNIQUE: Multidetector CT imaging of the chest was performed using the standard  protocol during bolus administration of intravenous contrast. Multiplanar CT image reconstructions and MIPs were obtained to evaluate the vascular anatomy. CONTRAST:  80 mL Isovue 370 nonionic COMPARISON:  Chest radiograph May 25, 2016 FINDINGS: Cardiovascular: There is no central vessel pulmonary embolus. Due to attenuation artifact due to the patient's large size, assessment of the peripheral pulmonary arterial vessels is less than optimal. There is no obvious pulmonary embolus more distally allowing for a degree of attenuation artifact which is symmetric bilaterally. There is no demonstrable thoracic aortic aneurysm or dissection. The main pulmonary outflow tract measures 4.9 cm which is felt to be compatible with a degree of pulmonary arterial hypertension. Visualized great vessels appear unremarkable. There is cardiomegaly. There is pericardial fluid along the right side of the heart laterally. Mediastinum/Nodes: Thyroid appears unremarkable. There is  no appreciable thoracic adenopathy. Lungs/Pleura: There is pulmonary edema throughout the lungs bilaterally, most pronounced in the perihilar and lower lobe regions but also moderate in the inferior lingula and right middle lobe regions. There is no well-defined airspace consolidation. There are no appreciable pleural effusions. Upper Abdomen: There is reflux of contrast into the inferior vena cava and pulmonary veins. Visualized upper abdominal structures otherwise appear unremarkable. Musculoskeletal: There is degenerative change in the thoracic spine with diffuse idiopathic skeletal hyperostosis. No blastic or lytic bone lesions are evident. Review of the MIP images confirms the above findings. IMPRESSION: No pulmonary embolus demonstrable. Visualization of the peripheral pulmonary arteries is less than optimal due to attenuation artifact from patient's large size. Main pulmonary outflow tract enlargement consistent with pulmonary arterial hypertension. Cardiomegaly with pulmonary edema consistent with congestive heart failure. Small pericardial effusion in the right heart region laterally. No pleural effusions evident. No adenopathy. Reflux of contrast in the inferior vena cava and hepatic veins is felt to be indicative of increased right heart pressure. Diffuse idiopathic skeletal hyperostosis in the thoracic spine. Electronically Signed   By: Lowella Grip III M.D.   On: 05/25/2016 14:55     Scheduled Meds: . enoxaparin (LOVENOX) injection  100 mg Subcutaneous Q24H  . furosemide  40 mg Intravenous BID  . labetalol  200 mg Oral BID  . nitroGLYCERIN  1 inch Topical Q6H  . potassium chloride  20 mEq Oral Daily  . sodium chloride flush  3 mL Intravenous Q12H   Continuous Infusions:    LOS: 1 day   Time Spent in minutes   30 minutes  Yassen Kinnett D.O. on 05/26/2016 at 1:58 PM  Between 7am to 7pm - Pager - [phone]  After 7pm go to [email or web address] - password [email or web address]  And look for the  night coverage person covering for me after hours  [facility] Office  [phone]

## 2016-05-26 NOTE — Progress Notes (Signed)
Patient is refusing CPAP for tonight, machine in room. Patient stated she is fine on oxygen.

## 2016-05-26 NOTE — Progress Notes (Addendum)
Patient Name: [name] [name] Date of Encounter: 05/26/2016  Primary Cardiologist: [facility] Problem List     Principal Problem:   Acute CHF (congestive heart failure) ([place]) Active Problems:   Malignant hypertension   Morbid obesity (HCC)   Renal insufficiency   Acute pulmonary edema (HCC)   Abdominal pain     Subjective   No CP, SOB improved. Sitting on edge of bed.   Inpatient Medications    Scheduled Meds: . enoxaparin (LOVENOX) injection  40 mg Subcutaneous Q24H  . furosemide  40 mg Intravenous BID  . labetalol  200 mg Oral BID  . nitroGLYCERIN  1 inch Topical Q6H  . potassium chloride  20 mEq Oral Daily  . sodium chloride flush  3 mL Intravenous Q12H   Continuous Infusions:  PRN Meds:.acetaminophen **OR** acetaminophen, hydrALAZINE, ondansetron **OR** ondansetron (ZOFRAN) IV   Vital Signs    Vitals:   05/25/16 2000 05/25/16 2057 05/25/16 2247 05/26/16 0520  BP: 132/98 122/77  110/68  Pulse: 87 90 91 78  Resp: _0 Temp:  98.5 F (36.9 C) 98.5 F (36.9 C) 98.4 F (36.9 C)  TempSrc:   Oral Oral  SpO2: (!) 89% 95%  95%  Weight:   (!) 466 lb 8 oz (211.6 kg) (!) 465 lb 1.6 oz (211 kg)  Height:   _1  (1.651 m)    No intake or output data in the 24 hours ending 05/26/16 0941 Filed Weights   05/25/16 1140 05/25/16 2247 05/26/16 0520  Weight: (!) 392 lb 8 oz (178 kg) (!) 466 lb 8 oz (211.6 kg) (!) 465 lb 1.6 oz (211 kg)    Physical Exam    GEN: Well nourished, well developed, in no acute distress.  HEENT: Grossly normal.  Neck: Supple, no JVD, carotid bruits, or masses. Cardiac: RRR, no murmurs, rubs, or gallops. No clubbing, cyanosis, edema.  Radials/DP/PT 2+ and equal bilaterally.  Respiratory:  Respirations regular and unlabored, clear to auscultation bilaterally, decreased BS. GI: Soft, nontender, nondistended, BS + x 4. obese MS: no deformity or atrophy. Skin: warm and dry, no rash. Neuro:  Strength and sensation are intact. Psych:  AAOx3.  Normal affect.  Labs    CBC  Recent Labs  05/25/16 1240  WBC 9.8  NEUTROABS 8.3*  HGB 14.3  HCT 47.9*  MCV 80.8  PLT 664   Basic Metabolic Panel  Recent Labs  05/25/16 1240 05/26/16 0435  NA 140 140  K 4.1 5.2*  CL 97* 98*  CO2 32 34*  GLUCOSE 121* 127*  BUN 21* 27*  CREATININE 1.35* 1.41*  CALCIUM 9.0 8.6*   Liver Function Tests  Recent Labs  05/25/16 1240  AST 34  ALT 24  ALKPHOS 102  BILITOT 2.3*  PROT 7.5  ALBUMIN 2.8*   No results for input(s): LIPASE, AMYLASE in the last 72 hours. Cardiac Enzymes  Recent Labs  05/25/16 2305 05/26/16 0435  TROPONINI 0.06* 0.10*   BNP Invalid input(s): POCBNP D-Dimer  Recent Labs  05/25/16 1240  DDIMER 2.59*   Hemoglobin A1C No results for input(s): HGBA1C in the last 72 hours. Fasting Lipid Panel  Recent Labs  05/26/16 0435  CHOL 128  HDL 20*  LDLCALC 92  TRIG 79  CHOLHDL 6.4   Thyroid Function Tests No results for input(s): TSH, T4TOTAL, T3FREE, THYROIDAB in the last 72 hours.  Invalid input(s): FREET3  Telemetry    NSR - Personally Reviewed  ECG  NSR, PRWP- Personally Reviewed  Radiology    Dg Chest 2 View  Result Date: 05/25/2016 CLINICAL DATA:  Chest pain and shortness of breath, 2 days duration. EXAM: CHEST  2 VIEW COMPARISON:  None. FINDINGS: Cardiac silhouette is markedly enlarged consistent with cardiomegaly and/or pericardial fluid. There is pulmonary venous hypertension with interstitial and alveolar pulmonary edema. No pleural effusion at this time. Bony structures are unremarkable. IMPRESSION: Enlarged cardiac silhouette consistent with cardiomegaly and/or pericardial fluid. Congestive heart failure with interstitial and alveolar edema. Electronically Signed   By: Nelson Chimes M.D.   On: 05/25/2016 13:14   Ct Angio Chest Pe W And/or Wo Contrast  Result Date: 05/25/2016 CLINICAL DATA:  Shortness of Breath and elevated D-dimer EXAM: CT ANGIOGRAPHY CHEST WITH  CONTRAST TECHNIQUE: Multidetector CT imaging of the chest was performed using the standard protocol during bolus administration of intravenous contrast. Multiplanar CT image reconstructions and MIPs were obtained to evaluate the vascular anatomy. CONTRAST:  80 mL Isovue 370 nonionic COMPARISON:  Chest radiograph May 25, 2016 FINDINGS: Cardiovascular: There is no central vessel pulmonary embolus. Due to attenuation artifact due to the patient's large size, assessment of the peripheral pulmonary arterial vessels is less than optimal. There is no obvious pulmonary embolus more distally allowing for a degree of attenuation artifact which is symmetric bilaterally. There is no demonstrable thoracic aortic aneurysm or dissection. The main pulmonary outflow tract measures 4.9 cm which is felt to be compatible with a degree of pulmonary arterial hypertension. Visualized great vessels appear unremarkable. There is cardiomegaly. There is pericardial fluid along the right side of the heart laterally. Mediastinum/Nodes: Thyroid appears unremarkable. There is no appreciable thoracic adenopathy. Lungs/Pleura: There is pulmonary edema throughout the lungs bilaterally, most pronounced in the perihilar and lower lobe regions but also moderate in the inferior lingula and right middle lobe regions. There is no well-defined airspace consolidation. There are no appreciable pleural effusions. Upper Abdomen: There is reflux of contrast into the inferior vena cava and pulmonary veins. Visualized upper abdominal structures otherwise appear unremarkable. Musculoskeletal: There is degenerative change in the thoracic spine with diffuse idiopathic skeletal hyperostosis. No blastic or lytic bone lesions are evident. Review of the MIP images confirms the above findings. IMPRESSION: No pulmonary embolus demonstrable. Visualization of the peripheral pulmonary arteries is less than optimal due to attenuation artifact from patient's large size.  Main pulmonary outflow tract enlargement consistent with pulmonary arterial hypertension. Cardiomegaly with pulmonary edema consistent with congestive heart failure. Small pericardial effusion in the right heart region laterally. No pleural effusions evident. No adenopathy. Reflux of contrast in the inferior vena cava and hepatic veins is felt to be indicative of increased right heart pressure. Diffuse idiopathic skeletal hyperostosis in the thoracic spine. Electronically Signed   By: Lowella Grip III M.D.   On: 05/25/2016 14:55     Cardiac Studies   CT scan chest: No PE, but dilated pulmonary artery (66m).  Patient Profile     35year old with super morbid obesity, secondary pulmonary hypertension, acute diastolic heart failure  Assessment & Plan    Acute diastolic HF  - getting ECHO to confirm EF and estimate pulmonary pressures  - lasix IV  - Creat mildly elevated. Monitor closely  Obesity  - Causing pulm htn  - weight loss  Secondary pulm htn  - as above  - weight loss  - BP control  - small pericardial effusion should be of no clinical concern.   Elevated troponin/ demand ischemia  -  low level troponin secondary to acute heart strain/failure.   Essential hypertension  - BP improved, see meds.     Tearful today - her 54 year old daughter birthday  Signed, Candee Furbish, MD  05/26/2016, 9:41 AM

## 2016-05-26 NOTE — Progress Notes (Signed)
Heart Failure Navigator Consult Note  Presentation: [name] [name] is a 35 y.o.  Woman with a history uncontrolled hypertension, gestational diabetes, morbid obesity, and medical noncompliance who reports a 1-2 week history of progressive dyspnea on exertion and orthopnea with lower extremity and abdominal wall edema.  She does not know how much weight she has gained in the past 2-3 months, but she knows that her weight is up.  She has had noticeable increased abdominal girth.  She explicitly denies chest pain, pressure, or tightness.  No light-headedness or loss of consciousness.    Past Medical History:  Diagnosis Date  . Diabetes mellitus without complication (HCC)    GDM  . Gestational diabetes mellitus in pregnancy 05/24/2013  . HTN in pregnancy, chronic 05/24/2013  . Hypertension   . Morbid obesity ([place])   . Sinus tachycardia     Social History   Social History  . Marital status: Single    Spouse name: N/A  . Number of children: N/A  . Years of education: N/A   Occupational History  . 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 Smoking status: Never Smoker  . Smokeless tobacco: Never Used  . Alcohol use No  . Drug use: No  . Sexual activity: Not Asked   Other Topics Concern  . None   Social History Narrative   LIVES ALONE   WORKS AT [employer] TE[employer]   DENIES ANY TOBACCO OR ETOH USE          ECHO: new pending BNP    Component Value Date/Time   BNP 594.3 (H) 05/25/2016 1240    ProBNP No results found for: PROBNP   Education Assessment and Provision:  Detailed education and instructions provided on heart failure disease management including the following:  Signs and symptoms of Heart Failure When to call the physician Importance of daily weights Low sodium diet Fluid restriction Medication management Anticipated future follow-up appointments  Patient education given on each of the above topics.  Patient acknowledges  understanding and acceptance of all instructions.  I spoke with [name] [name] regarding her current hospitalization and recent diagnosis of HF.  She was quite tearful regarding this new diagnosis and the fact that today is her 60 year old daughters birthday.  We discussed the importance of daily weights and how weight increases relate to the signs and symptoms of HF.  I encouraged a low sodium diet and discussed high sodium foods to avoid.  She admits that she had quit taking previously prescribed medications due to cost and no insurance.  She now has insurance and we discussed the importance of the medications and taking all prescribed meds.  She currently lives with her mother and daughter (70years old).  She was previously followed at [facility] and seems lost to follow-up since 2014.  Education Materials:  "Living Better With Heart Failure" Booklet, Daily Weight Tracker Tool    High Risk Criteria for Readmission and/or Poor Patient Outcomes:  (Recommend Follow-up with [facility])-- yes for clinic SW support and financial medication assistance   EF <30%- pending  2 or more admissions in 6 months-No  Difficult social situation- No  Demonstrates medication noncompliance- yes admits issues with affording medications  Barriers of Care:  Health literacy, HF knowledge  Discharge Planning:  Plans to return to home with her 63 year old daughter in [place].

## 2016-05-27 ENCOUNTER — Inpatient Hospital Stay (HOSPITAL_COMMUNITY): Payer: PRIVATE HEALTH INSURANCE

## 2016-05-27 DIAGNOSIS — R1010 Upper abdominal pain, unspecified: Secondary | ICD-10-CM

## 2016-05-27 DIAGNOSIS — J9601 Acute respiratory failure with hypoxia: Secondary | ICD-10-CM

## 2016-05-27 LAB — PHOSPHORUS: Phosphorus: 5.6 mg/dL — ABNORMAL HIGH (ref 2.5–4.6)

## 2016-05-27 LAB — PREGNANCY, URINE: Preg Test, Ur: NEGATIVE

## 2016-05-27 LAB — CBC
HCT: 46.9 % — ABNORMAL HIGH (ref 36.0–46.0)
Hemoglobin: 13.5 g/dL (ref 12.0–15.0)
MCH: 24.7 pg — ABNORMAL LOW (ref 26.0–34.0)
MCHC: 28.8 g/dL — ABNORMAL LOW (ref 30.0–36.0)
MCV: 85.7 fL (ref 78.0–100.0)
Platelets: 189 10*3/uL (ref 150–400)
RBC: 5.47 MIL/uL — ABNORMAL HIGH (ref 3.87–5.11)
RDW: 21 % — ABNORMAL HIGH (ref 11.5–15.5)
WBC: 6 10*3/uL (ref 4.0–10.5)

## 2016-05-27 LAB — BLOOD GAS, ARTERIAL
Acid-Base Excess: 10.1 mmol/L — ABNORMAL HIGH (ref 0.0–2.0)
Bicarbonate: 37.6 mmol/L — ABNORMAL HIGH (ref 20.0–28.0)
Delivery systems: POSITIVE
Drawn by: 252031
Expiratory PAP: 5
FIO2: 0.5
Inspiratory PAP: 20
O2 Saturation: 98.1 %
Patient temperature: 98.6
RATE: 15 resp/min
pCO2 arterial: 93.6 mmHg (ref 32.0–48.0)
pH, Arterial: 7.228 — ABNORMAL LOW (ref 7.350–7.450)
pO2, Arterial: 129 mmHg — ABNORMAL HIGH (ref 83.0–108.0)

## 2016-05-27 LAB — BASIC METABOLIC PANEL
Anion gap: 9 (ref 5–15)
Anion gap: 7 (ref 5–15)
BUN: 31 mg/dL — ABNORMAL HIGH (ref 6–20)
BUN: 31 mg/dL — ABNORMAL HIGH (ref 6–20)
CO2: 35 mmol/L — ABNORMAL HIGH (ref 22–32)
CO2: 35 mmol/L — ABNORMAL HIGH (ref 22–32)
Calcium: 8.6 mg/dL — ABNORMAL LOW (ref 8.9–10.3)
Calcium: 8.5 mg/dL — ABNORMAL LOW (ref 8.9–10.3)
Chloride: 95 mmol/L — ABNORMAL LOW (ref 101–111)
Chloride: 97 mmol/L — ABNORMAL LOW (ref 101–111)
Creatinine, Ser: 1.29 mg/dL — ABNORMAL HIGH (ref 0.44–1.00)
Creatinine, Ser: 1.35 mg/dL — ABNORMAL HIGH (ref 0.44–1.00)
GFR calc Af Amer: >60 mL/min (ref >60)
GFR calc Af Amer: 58 mL/min — ABNORMAL LOW (ref >60)
GFR calc non Af Amer: 53 mL/min — ABNORMAL LOW (ref >60)
GFR calc non Af Amer: 50 mL/min — ABNORMAL LOW (ref >60)
Glucose, Bld: 117 mg/dL — ABNORMAL HIGH (ref 65–99)
Glucose, Bld: 121 mg/dL — ABNORMAL HIGH (ref 65–99)
Potassium: 4.4 mmol/L (ref 3.5–5.1)
Potassium: 5.1 mmol/L (ref 3.5–5.1)
Sodium: 139 mmol/L (ref 135–145)
Sodium: 139 mmol/L (ref 135–145)

## 2016-05-27 LAB — HEMOGLOBIN A1C
Hgb A1c MFr Bld: 7.9 % — ABNORMAL HIGH (ref 4.8–5.6)
Mean Plasma Glucose: 180 mg/dL

## 2016-05-27 LAB — MAGNESIUM: Magnesium: 1.9 mg/dL (ref 1.7–2.4)

## 2016-05-27 LAB — LACTIC ACID, PLASMA: Lactic Acid, Venous: 1.7 mmol/L (ref 0.5–1.9)

## 2016-05-27 MED ORDER — MIDAZOLAM HCL 5 MG/ML IJ SOLN
INTRAMUSCULAR | Status: AC
  Filled 2016-05-27: qty 1

## 2016-05-27 MED ORDER — FUROSEMIDE 10 MG/ML IJ SOLN
80.0000 mg | Freq: Four times a day (QID) | INTRAMUSCULAR | Status: DC
  Administered 2016-05-27 – 2016-06-04 (×32): 80 mg via INTRAVENOUS
  Filled 2016-05-27 (×33): qty 8

## 2016-05-27 MED ORDER — FENTANYL CITRATE (PF) 100 MCG/2ML IJ SOLN
INTRAMUSCULAR | Status: AC
  Filled 2016-05-27: qty 2

## 2016-05-27 MED ORDER — NITROGLYCERIN IN D5W 200-5 MCG/ML-% IV SOLN
0.0000 ug/min | INTRAVENOUS | Status: DC
  Administered 2016-05-27: 40 ug/min via INTRAVENOUS
  Administered 2016-05-28: 150 ug/min via INTRAVENOUS
  Administered 2016-05-28: 120 ug/min via INTRAVENOUS
  Administered 2016-05-28: 200 ug/min via INTRAVENOUS
  Filled 2016-05-27 (×2): qty 250

## 2016-05-27 MED ORDER — FLUCONAZOLE 100 MG PO TABS
100.0000 mg | ORAL_TABLET | Freq: Every day | ORAL | Status: DC
  Administered 2016-05-28 – 2016-06-03 (×7): 100 mg via ORAL
  Filled 2016-05-27 (×11): qty 1

## 2016-05-27 MED ORDER — MIDAZOLAM HCL 2 MG/2ML IJ SOLN
INTRAMUSCULAR | Status: AC
  Filled 2016-05-27: qty 2

## 2016-05-27 MED ORDER — LORATADINE 10 MG PO TABS
10.0000 mg | ORAL_TABLET | Freq: Every day | ORAL | Status: DC
  Administered 2016-05-27: 10 mg via ORAL
  Filled 2016-05-27: qty 1

## 2016-05-27 MED ORDER — ORAL CARE MOUTH RINSE
15.0000 mL | Freq: Two times a day (BID) | OROMUCOSAL | Status: DC
  Administered 2016-05-28 (×2): 15 mL via OROMUCOSAL

## 2016-05-27 MED ORDER — NITROGLYCERIN IN D5W 200-5 MCG/ML-% IV SOLN
INTRAVENOUS | Status: AC
  Filled 2016-05-27: qty 250

## 2016-05-27 MED ORDER — HYDRALAZINE HCL 20 MG/ML IJ SOLN
10.0000 mg | INTRAMUSCULAR | Status: DC
  Administered 2016-05-27 – 2016-05-28 (×5): 10 mg via INTRAVENOUS
  Filled 2016-05-27 (×5): qty 1

## 2016-05-27 MED ORDER — FLUCONAZOLE 200 MG PO TABS
200.0000 mg | ORAL_TABLET | Freq: Once | ORAL | Status: DC
  Filled 2016-05-27: qty 1

## 2016-05-27 MED ORDER — CHLORHEXIDINE GLUCONATE 0.12 % MT SOLN
15.0000 mL | Freq: Two times a day (BID) | OROMUCOSAL | Status: DC
  Administered 2016-05-27 – 2016-05-28 (×3): 15 mL via OROMUCOSAL
  Filled 2016-05-27: qty 15

## 2016-05-27 NOTE — Progress Notes (Signed)
CM following for DCP; pt lethargic, nurse at bedside. Mindi Slicker [facility] [phone]

## 2016-05-27 NOTE — Progress Notes (Signed)
Pt continued to refuse CPAP, slept well during the night, NAD voiced.

## 2016-05-27 NOTE — Progress Notes (Signed)
Patient Name: [name] [name] Date of Encounter: 05/27/2016  Primary Cardiologist: [facility] Problem List     Principal Problem:   Acute congestive heart failure [facility]) Active Problems:   Malignant hypertension   Morbid obesity (HCC)   Sleep apnea   Renal insufficiency   Acute pulmonary edema (HCC)   Abdominal pain   Hepatic congestion   Sinus tachycardia   Pericardial effusion   Pulmonary hypertension     Subjective   No CP, comfortable, sleeping  Inpatient Medications    Scheduled Meds: . enoxaparin (LOVENOX) injection  100 mg Subcutaneous Q24H  . fluconazole  200 mg Oral Once  . furosemide  40 mg Intravenous BID  . labetalol  200 mg Oral BID  . loratadine  10 mg Oral Daily  . nitroGLYCERIN  1 inch Topical Q6H  . potassium chloride  20 mEq Oral Daily  . sodium chloride flush  3 mL Intravenous Q12H   Continuous Infusions:  PRN Meds:.acetaminophen **OR** acetaminophen, hydrALAZINE, ondansetron **OR** ondansetron (ZOFRAN) IV   Vital Signs    Vitals:   05/26/16 1109 05/26/16 1147 05/26/16 2111 05/27/16 0649  BP: 137/81 (!) 141/80 129/65 131/80  Pulse:  82 86 89  Resp:  _0 Temp:  97.7 F (36.5 C) 97.8 F (36.6 C) 98.2 F (36.8 C)  TempSrc:  Oral Oral Oral  SpO2:  92% 93% 92%  Weight:    (!) 469 lb (212.7 kg)  Height:        Intake/Output Summary (Last 24 hours) at 05/27/16 1228 Last data filed at 05/27/16 0826  Gross per 24 hour  Intake              600 ml  Output             1275 ml  Net             -675 ml   Filed Weights   05/25/16 2247 05/26/16 0520 05/27/16 0649  Weight: (!) 466 lb 8 oz (211.6 kg) (!) 465 lb 1.6 oz (211 kg) (!) 469 lb (212.7 kg)    Physical Exam    GEN: Laying in bed, in no acute distress.  HEENT: Grossly normal.  Neck: Supple, no JVD, carotid bruits, or masses. Cardiac: RRR, no murmurs, rubs, or gallops. No clubbing, cyanosis, edema.  Radials/DP/PT 2+ and equal bilaterally.  Respiratory:  Respirations  regular increased effort noted, clear to auscultation bilaterally, decreased BS. GI: Soft, nontender, nondistended, BS + x 4. obese MS: no deformity or atrophy. Skin: warm and dry, no rash. Neuro:  Strength and sensation are intact. Psych: AAOx3.  Normal affect.  Labs    CBC  Recent Labs  05/25/16 1240 05/27/16 0339  WBC 9.8 6.0  NEUTROABS 8.3*  --   HGB 14.3 13.5  HCT 47.9* 46.9*  MCV 80.8 85.7  PLT 191 446   Basic Metabolic Panel  Recent Labs  05/26/16 0435 05/27/16 0339  NA 140 139  K 5.2* 4.4  CL 98* 95*  CO2 34* 35*  GLUCOSE 127* 117*  BUN 27* 31*  CREATININE 1.41* 1.29*  CALCIUM 8.6* 8.6*   Liver Function Tests  Recent Labs  05/25/16 1240  AST 34  ALT 24  ALKPHOS 102  BILITOT 2.3*  PROT 7.5  ALBUMIN 2.8*   No results for input(s): LIPASE, AMYLASE in the last 72 hours. Cardiac Enzymes  Recent Labs  05/25/16 2305 05/26/16 0435 05/26/16 0946  TROPONINI 0.06* 0.10* 0.06*  BNP Invalid input(s): POCBNP D-Dimer  Recent Labs  05/25/16 1240  DDIMER 2.59*   Hemoglobin A1C  Recent Labs  05/25/16 2148  HGBA1C 7.9*   Fasting Lipid Panel  Recent Labs  05/26/16 0435  CHOL 128  HDL 20*  LDLCALC 92  TRIG 79  CHOLHDL 6.4   Thyroid Function Tests No results for input(s): TSH, T4TOTAL, T3FREE, THYROIDAB in the last 72 hours.  Invalid input(s): FREET3  Telemetry    NSR - Personally Reviewed  ECG    NSR, PRWP- Personally Reviewed  Radiology    Dg Chest 2 View  Result Date: 05/25/2016 CLINICAL DATA:  Chest pain and shortness of breath, 2 days duration. EXAM: CHEST  2 VIEW COMPARISON:  None. FINDINGS: Cardiac silhouette is markedly enlarged consistent with cardiomegaly and/or pericardial fluid. There is pulmonary venous hypertension with interstitial and alveolar pulmonary edema. No pleural effusion at this time. Bony structures are unremarkable. IMPRESSION: Enlarged cardiac silhouette consistent with cardiomegaly and/or  pericardial fluid. Congestive heart failure with interstitial and alveolar edema. Electronically Signed   By: Nelson Chimes M.D.   On: 05/25/2016 13:14   Ct Angio Chest Pe W And/or Wo Contrast  Result Date: 05/25/2016 CLINICAL DATA:  Shortness of Breath and elevated D-dimer EXAM: CT ANGIOGRAPHY CHEST WITH CONTRAST TECHNIQUE: Multidetector CT imaging of the chest was performed using the standard protocol during bolus administration of intravenous contrast. Multiplanar CT image reconstructions and MIPs were obtained to evaluate the vascular anatomy. CONTRAST:  80 mL Isovue 370 nonionic COMPARISON:  Chest radiograph May 25, 2016 FINDINGS: Cardiovascular: There is no central vessel pulmonary embolus. Due to attenuation artifact due to the patient's large size, assessment of the peripheral pulmonary arterial vessels is less than optimal. There is no obvious pulmonary embolus more distally allowing for a degree of attenuation artifact which is symmetric bilaterally. There is no demonstrable thoracic aortic aneurysm or dissection. The main pulmonary outflow tract measures 4.9 cm which is felt to be compatible with a degree of pulmonary arterial hypertension. Visualized great vessels appear unremarkable. There is cardiomegaly. There is pericardial fluid along the right side of the heart laterally. Mediastinum/Nodes: Thyroid appears unremarkable. There is no appreciable thoracic adenopathy. Lungs/Pleura: There is pulmonary edema throughout the lungs bilaterally, most pronounced in the perihilar and lower lobe regions but also moderate in the inferior lingula and right middle lobe regions. There is no well-defined airspace consolidation. There are no appreciable pleural effusions. Upper Abdomen: There is reflux of contrast into the inferior vena cava and pulmonary veins. Visualized upper abdominal structures otherwise appear unremarkable. Musculoskeletal: There is degenerative change in the thoracic spine with diffuse  idiopathic skeletal hyperostosis. No blastic or lytic bone lesions are evident. Review of the MIP images confirms the above findings. IMPRESSION: No pulmonary embolus demonstrable. Visualization of the peripheral pulmonary arteries is less than optimal due to attenuation artifact from patient's large size. Main pulmonary outflow tract enlargement consistent with pulmonary arterial hypertension. Cardiomegaly with pulmonary edema consistent with congestive heart failure. Small pericardial effusion in the right heart region laterally. No pleural effusions evident. No adenopathy. Reflux of contrast in the inferior vena cava and hepatic veins is felt to be indicative of increased right heart pressure. Diffuse idiopathic skeletal hyperostosis in the thoracic spine. Electronically Signed   By: Lowella Grip III M.D.   On: 05/25/2016 14:55     Cardiac Studies   CT scan chest: No PE, but dilated pulmonary artery (72m).  ECHO: - Left ventricle: The cavity size was  normal. Systolic function was   normal. The estimated ejection fraction was in the range of 50%   to 55%. Regional wall motion abnormalities cannot be excluded.   The study is not technically sufficient to allow evaluation of LV   diastolic function. - Left atrium: The atrium was mildly dilated. - Right atrium: The atrium was mildly dilated. - Pulmonary arteries: Systolic pressure was mildly increased. PA   peak pressure: 42 mm Hg (S).  Impressions:  - Extremely limited; definity used; probable low normal LV sstolci   function; mild biatrial enlargement; mild TR; mildly elevated   pulmonary pressure; suggest MUGA or cardiac MRI if clinically   indicated.  Patient Profile     35 year old with super morbid obesity, secondary pulmonary hypertension, acute diastolic heart failure  Assessment & Plan    Acute diastolic HF  -ECHO with normal EF but mild pulm HTN.   - Would continue with lasix and watch for change in creat.   -  Difficult to assess fluid status.  Obesity  - Causing pulm htn  - weight loss  - CPAP  Secondary pulm htn  - as above  - weight loss  - BP control  - small pericardial effusion should be of no clinical concern.   Elevated troponin/ demand ischemia  - low level troponin secondary to acute heart strain/failure.   Essential hypertension  - BP improved, see meds.   Signed, Candee Furbish, MD  05/27/2016, 12:28 PM

## 2016-05-27 NOTE — Progress Notes (Signed)
RT Note: pt arrived to 2H07 on Bipap at this time, ABG done, pt changed to vent NIV/PC at this time per MD due to ABG results, pt tolerating well, awake and alert, RT will monitor

## 2016-05-27 NOTE — Progress Notes (Addendum)
Nutrition Education Note  RD consulted for nutrition education regarding acute CHF and obesity.   Pt asleep at time of first visit and out of room at time of second visit. Per chart, pt has already met with Heart Failure Coordinator, Carole Binning. CHF education packet at bedside with handouts "Heart Failure Nutrition Therapy" and "Sodium Content of Foods" from the Academy of [employer] Dietetics. RD wrote contact information on handouts encouraging patient to call dietitian to discuss.    Body mass index is 78.05 kg/m. Pt meets criteria for Morbid Obesity based on current BMI.  Current diet order is Heart Healthy, patient is consuming approximately 75% of meals at this time. Labs and medications reviewed. Will attempt follow-up at later date to assess additional education needs. RD contact information provided. If additional nutrition issues arise, please re-consult RD.   Scarlette Ar RD, CSP, LDN Inpatient Clinical Dietitian Pager: [phone] After Hours Pager: [phone]

## 2016-05-27 NOTE — Progress Notes (Addendum)
PROGRESS NOTE    [name] [name]  YJE:[id number] DOB: Dec 07, 1980 DOA: 05/25/2016 PCP: No PCP Per Patient   Chief Complaint  Patient presents with  . Abdominal Pain    "bloating"     Brief Narrative:  HPI on 05/25/2016 by Dr. Lily Kocher [name] [name] is a 35 y.o.  Woman with a history uncontrolled hypertension, gestational diabetes, morbid obesity, and medical noncompliance who reports a 1-2 week history of progressive dyspnea on exertion and orthopnea with lower extremity and abdominal wall edema.  She does not know how much weight she has gained in the past 2-3 months, but she knows that her weight is up.  She has had noticeable increased abdominal girth.  She explicitly denies chest pain, pressure, or tightness.  No light-headedness or loss of consciousness.  She admits to noncompliance with recommended treatment for hypertension for YEARS.   Assessment & Plan   Addendum Patient had a change in her level of consciousness. Assess patient at bedside, she was very lethargic somewhat responsive. Patient did have very decreased and diminished breath sounds with minimal air movement. Rapid response was called. Spoke with PCCM who recommended transfer to ICU. BiPAP, BP G, chest x-ray were ordered.  Acute CHF, ? Diastolic -Echocardiogram in 2012 showed an EF of 50-55% -reviewed Chest x-ray, consistent with CHF -BNP 594 -CTA chest show cardiomegaly with pulmonary edema, consistent with congestive heart failure. Small. Cardio effusion -Cardiology consulted and appreciated -Continue to monitor intake and output, daily weights -Continue IV Lasix -Echocardiogram EF 50-55%, bilateral atrial enlargement, mild TR, mildly elevated pulmonary pressure (55mHg).  Study not technically sufficient to allow evaluation LV diastolic function  Acute hypoxic respiratory failure -Patient's Oxygen sat was noted to be in the 70s while in the ER before admission.  She was placed on 3L of O2 and stats were in the  90s. -Likely secondary to the above -will ask for O2 sats to be monitored at rest and with ambulation, on and off oO2  Renal insufficiency vs AKI -Creatinine likely to worsen due to IV Lasix -Creatinine improving today, 1.29 (in 2015 Creatinine 0.6) -Continue to monitor BMP closely  Pulmonary hypertension -Treatment plan as above -Noted on CT a chest, main pulmonary outflow tract origin consistent with pulmonary arterial hypertension  Morbid obesity -Upon discharge, patient will need to speak with her primary care physician regarding lifestyle modifications including diet and exercise -BMI 77  Elevated troponin -Likely secondary to demand ischemia or heart strain and failure -Troponin peak of 0.10, currently 0.06  Essential hypertension -Continue Lasix, labetalol  LE edema/wounds -Wound care consulted -Likely related to the above  Oral thrush -Will start patient on oral diflucan -Obtain HIV testing   DVT Prophylaxis  lovenox  Code Status: full  Family Communication:  None at bedside  Disposition Plan: Admitted, continue IV lasix  Consultants Cardiology  Procedures  Echocardiogram  Antibiotics   Anti-infectives    Start     Dose/Rate Route Frequency Ordered Stop   05/27/16 1045  fluconazole (DIFLUCAN) tablet 200 mg     200 mg Oral  Once 05/27/16 1041        Subjective:   TPrudencio [name] and examined today.  Patient feels her breathing has improved.  Denies any chest pain, cough, dizziness, headache, abdominal pain, nausea or vomiting, diarrhea or constipation. Feels she may be "coming down with a cold."  Objective:   Vitals:   05/26/16 1109 05/26/16 1147 05/26/16 2111 05/27/16 0649  BP: 137/81 (!) 141/80 129/65 131/80  Pulse:  82 86 89  Resp:  _0 Temp:  97.7 F (36.5 C) 97.8 F (36.6 C) 98.2 F (36.8 C)  TempSrc:  Oral Oral Oral  SpO2:  92% 93% 92%  Weight:    (!) 212.7 kg (469 lb)  Height:        Intake/Output Summary (Last 24  hours) at 05/27/16 1044 Last data filed at 05/27/16 0826  Gross per 24 hour  Intake              600 ml  Output             1275 ml  Net             -675 ml   Filed Weights   05/25/16 2247 05/26/16 0520 05/27/16 0649  Weight: (!) 211.6 kg (466 lb 8 oz) (!) 211 kg (465 lb 1.6 oz) (!) 212.7 kg (469 lb)    Exam  General: Well developed, well nourished, no distress  HEENT: NCAT, mucous membranes moist. Oral thrush  Neck: Supple, no masses  Cardiovascular: S1 S2 auscultated, Regular rate and rhythm.  Respiratory: Diminished breath sounds (difficult to auscultate due to body habitus)  Abdomen: Soft, obese, nontender, nondistended, + bowel sounds  Extremities: warm dry without cyanosis clubbing. LE edema, weeping  Neuro: AAOx3, nonfocal  Psych: Appropriate mood and affect   Data Reviewed: I have personally reviewed following labs and imaging studies  CBC:  Recent Labs Lab 05/25/16 1240 05/27/16 0339  WBC 9.8 6.0  NEUTROABS 8.3*  --   HGB 14.3 13.5  HCT 47.9* 46.9*  MCV 80.8 85.7  PLT 191 160   Basic Metabolic Panel:  Recent Labs Lab 05/25/16 1240 05/26/16 0435 05/27/16 0339  NA 140 140 139  K 4.1 5.2* 4.4  CL 97* 98* 95*  CO2 32 34* 35*  GLUCOSE 121* 127* 117*  BUN 21* 27* 31*  CREATININE 1.35* 1.41* 1.29*  CALCIUM 9.0 8.6* 8.6*   GFR: Estimated Creatinine Clearance: 113.5 mL/min (by C-G formula based on SCr of 1.29 mg/dL (H)). Liver Function Tests:  Recent Labs Lab 05/25/16 1240  AST 34  ALT 24  ALKPHOS 102  BILITOT 2.3*  PROT 7.5  ALBUMIN 2.8*   No results for input(s): LIPASE, AMYLASE in the last 168 hours. No results for input(s): AMMONIA in the last 168 hours. Coagulation Profile:  Recent Labs Lab 05/26/16 0435  INR 1.28   Cardiac Enzymes:  Recent Labs Lab 05/25/16 2305 05/26/16 0435 05/26/16 0946  TROPONINI 0.06* 0.10* 0.06*   BNP (last 3 results) No results for input(s): PROBNP in the last 8760 hours. HbA1C:  Recent  Labs  05/25/16 2148  HGBA1C 7.9*   CBG: No results for input(s): GLUCAP in the last 168 hours. Lipid Profile:  Recent Labs  05/26/16 0435  CHOL 128  HDL 20*  LDLCALC 92  TRIG 79  CHOLHDL 6.4   Thyroid Function Tests: No results for input(s): TSH, T4TOTAL, FREET4, T3FREE, THYROIDAB in the last 72 hours. Anemia Panel: No results for input(s): VITAMINB12, FOLATE, FERRITIN, TIBC, IRON, RETICCTPCT in the last 72 hours. Urine analysis:    Component Value Date/Time   COLORURINE YELLOW 05/25/2016 1410   APPEARANCEUR CLEAR 05/25/2016 1410   LABSPEC 1.012 05/25/2016 1410   PHURINE 5.0 05/25/2016 1410   GLUCOSEU NEGATIVE 05/25/2016 1410   HGBUR NEGATIVE 05/25/2016 1410   BILIRUBINUR NEGATIVE 05/25/2016 1410   KETONESUR NEGATIVE 05/25/2016 1410   PROTEINUR NEGATIVE 05/25/2016 1410   UROBILINOGEN  1.0 12/12/2013 2146   NITRITE NEGATIVE 05/25/2016 1410   LEUKOCYTESUR NEGATIVE 05/25/2016 1410   Sepsis Labs: _0 (procalcitonin:4,lacticidven:4)  )No results found for this or any previous visit (from the past 240 hour(s)).    Radiology Studies: Dg Chest 2 View  Result Date: 05/25/2016 CLINICAL DATA:  Chest pain and shortness of breath, 2 days duration. EXAM: CHEST  2 VIEW COMPARISON:  None. FINDINGS: Cardiac silhouette is markedly enlarged consistent with cardiomegaly and/or pericardial fluid. There is pulmonary venous hypertension with interstitial and alveolar pulmonary edema. No pleural effusion at this time. Bony structures are unremarkable. IMPRESSION: Enlarged cardiac silhouette consistent with cardiomegaly and/or pericardial fluid. Congestive heart failure with interstitial and alveolar edema. Electronically Signed   By: Nelson Chimes M.D.   On: 05/25/2016 13:14   Ct Angio Chest Pe W And/or Wo Contrast  Result Date: 05/25/2016 CLINICAL DATA:  Shortness of Breath and elevated D-dimer EXAM: CT ANGIOGRAPHY CHEST WITH CONTRAST TECHNIQUE: Multidetector CT imaging of the chest  was performed using the standard protocol during bolus administration of intravenous contrast. Multiplanar CT image reconstructions and MIPs were obtained to evaluate the vascular anatomy. CONTRAST:  80 mL Isovue 370 nonionic COMPARISON:  Chest radiograph May 25, 2016 FINDINGS: Cardiovascular: There is no central vessel pulmonary embolus. Due to attenuation artifact due to the patient's large size, assessment of the peripheral pulmonary arterial vessels is less than optimal. There is no obvious pulmonary embolus more distally allowing for a degree of attenuation artifact which is symmetric bilaterally. There is no demonstrable thoracic aortic aneurysm or dissection. The main pulmonary outflow tract measures 4.9 cm which is felt to be compatible with a degree of pulmonary arterial hypertension. Visualized great vessels appear unremarkable. There is cardiomegaly. There is pericardial fluid along the right side of the heart laterally. Mediastinum/Nodes: Thyroid appears unremarkable. There is no appreciable thoracic adenopathy. Lungs/Pleura: There is pulmonary edema throughout the lungs bilaterally, most pronounced in the perihilar and lower lobe regions but also moderate in the inferior lingula and right middle lobe regions. There is no well-defined airspace consolidation. There are no appreciable pleural effusions. Upper Abdomen: There is reflux of contrast into the inferior vena cava and pulmonary veins. Visualized upper abdominal structures otherwise appear unremarkable. Musculoskeletal: There is degenerative change in the thoracic spine with diffuse idiopathic skeletal hyperostosis. No blastic or lytic bone lesions are evident. Review of the MIP images confirms the above findings. IMPRESSION: No pulmonary embolus demonstrable. Visualization of the peripheral pulmonary arteries is less than optimal due to attenuation artifact from patient's large size. Main pulmonary outflow tract enlargement consistent with  pulmonary arterial hypertension. Cardiomegaly with pulmonary edema consistent with congestive heart failure. Small pericardial effusion in the right heart region laterally. No pleural effusions evident. No adenopathy. Reflux of contrast in the inferior vena cava and hepatic veins is felt to be indicative of increased right heart pressure. Diffuse idiopathic skeletal hyperostosis in the thoracic spine. Electronically Signed   By: Lowella Grip III M.D.   On: 05/25/2016 14:55     Scheduled Meds: . enoxaparin (LOVENOX) injection  100 mg Subcutaneous Q24H  . fluconazole  200 mg Oral Once  . furosemide  40 mg Intravenous BID  . labetalol  200 mg Oral BID  . loratadine  10 mg Oral Daily  . nitroGLYCERIN  1 inch Topical Q6H  . potassium chloride  20 mEq Oral Daily  . sodium chloride flush  3 mL Intravenous Q12H   Continuous Infusions:    LOS: 2 days  Time Spent in minutes   30 minutes  Billey Wojciak D.O. on 05/27/2016 at 10:44 AM  Between 7am to 7pm - Pager - [phone]  After 7pm go to [email or web address] - password [email or web address]  And look for the night coverage person covering for me after hours  [facility] Office  [phone]

## 2016-05-27 NOTE — Progress Notes (Signed)
Patient's mother called and informed of patient's status and transfer to ICU.  Emotional support given.  She was given name of primary nurse and will be in to visit later this afternoon.

## 2016-05-27 NOTE — Progress Notes (Addendum)
Noted that HgbA1C is 7.9%. Patient has had gestational diabetes in the past. According to American Diabetes Association, HgbA1c of 6.5% is a diagnosis of Diabetes.  Will need to be notified of diagnosis before being given instruction. Recommend checking blood sugars while in the hospital TID & HS. Will need follow up with PCP for glucose control.  Harvel Ricks RN BSN CDE

## 2016-05-27 NOTE — Progress Notes (Signed)
Arterial blood gas drawn at 1511 did not cross over from the I-stat machine. PH=7.167,PCO2=>100,PO2=103 other values out of reportable range. Spoke with [facility] about this issue.

## 2016-05-27 NOTE — Consult Note (Signed)
PULMONARY / CRITICAL CARE MEDICINE   Name: [name] [name] MRN: [id number] DOB: [birth year]    ADMISSION DATE:  05/25/2016 CONSULTATION DATE:  05/27/16  REFERRING MD:  Dr. Ree Kida   CHIEF COMPLAINT:  Altered Mental Status   HISTORY OF PRESENT ILLNESS:   [name] with PMH of morbid obesity (469 lbs), DM, HTN and tachycardia who presented to [facility] on 10/3 with reports of abdominal bloating and heavy abdominal pressure.    On admit, the patient reported she had not taken her blood pressure medications in over one year.  She was profoundly hypertensive on admit as well, initial BP 215/151.  She was found to be hypoxic with saturations in the 70's which improved with 3L O2.  D-Dimer was elevated at 2.59 and a CTA of the chest showed CHF but no PE.  BNP 594.  The patient was admitted per [facility] and treated for acute decompensated CHF.  She was treated with IV diuresis, blood pressure control, afterload reduction and fluid restriction.  ECHO assessment revealed LVEF 50-55%, difficult to assess LV diastolic function, mildly dilated lA, RA, PA peak 42 mm Hg.  On 10/5 afternoon, the patient was found to be altered and difficult to arouse.  Rapid response team was activated.  ABG assessed > 7.16 & pCO2 > 100.  The patient placed on BiPAP and transferred to ICU.  With stimulation of transfer, the patient woke some and was able to interact with staff.    PCCM called for ICU assessment.      PAST MEDICAL HISTORY :  She  has a past medical history of Diabetes mellitus without complication ([place]); Gestational diabetes mellitus in pregnancy (05/24/2013); HTN in pregnancy, chronic (05/24/2013); Hypertension; Morbid obesity ([place]); and Sinus tachycardia.  PAST SURGICAL HISTORY: She  has no past surgical history on file.  No Known Allergies  No current facility-administered medications on file prior to encounter.    Current Outpatient Prescriptions on File Prior to Encounter  Medication Sig  . ibuprofen  (ADVIL,MOTRIN) 200 MG tablet Take 600 mg by mouth every 6 (six) hours as needed for mild pain.    FAMILY HISTORY:  Her indicated that her mother is alive. She indicated that her father is alive. She indicated that the status of her other is unknown.    SOCIAL HISTORY: She  reports that she has never smoked. She has never used smokeless tobacco. She reports that she does not drink alcohol or use drugs.  REVIEW OF SYSTEMS:  Unable to complete as patient is on BiPAP   SUBJECTIVE:   VITAL SIGNS: BP (!) 152/91 (BP Location: Right Arm)   Pulse 87   Temp 98.2 F (36.8 C) (Oral)   Resp 18   Ht _0  (1.651 m)   Wt (!) 469 lb (212.7 kg)   SpO2 92%   BMI 78.05 kg/m   HEMODYNAMICS:    VENTILATOR SETTINGS:    INTAKE / OUTPUT: I/O last 3 completed shifts: In: 840 [P.O.:840] Out: 800 [Urine:800]  PHYSICAL EXAMINATION: General:  Morbidly obese female on BiPAP  Neuro:  Drowsy but awakens to voice, MAE  HEENT:  MM pink/moist, unable to assess JVD  Cardiovascular:  s1s2 rrr, tachy, distant tones  Lungs:  Shallow, non-labored, lungs bilaterally diminished but clear  Abdomen:  Obese, soft, bsx4 active  Musculoskeletal:  No acute deformities  Skin:  Warm/dry, generalized edema  LABS:  BMET  Recent Labs Lab 05/25/16 1240 05/26/16 0435 05/27/16 0339  NA 140 140 139  K  4.1 5.2* 4.4  CL 97* 98* 95*  CO2 32 34* 35*  BUN 21* 27* 31*  CREATININE 1.35* 1.41* 1.29*  GLUCOSE 121* 127* 117*    Electrolytes  Recent Labs Lab 05/25/16 1240 05/26/16 0435 05/27/16 0339  CALCIUM 9.0 8.6* 8.6*    CBC  Recent Labs Lab 05/25/16 1240 05/27/16 0339  WBC 9.8 6.0  HGB 14.3 13.5  HCT 47.9* 46.9*  PLT 191 189    Coag's  Recent Labs Lab 05/26/16 0435  APTT 32  INR 1.28    Sepsis Markers No results for input(s): LATICACIDVEN, PROCALCITON, O2SATVEN in the last 168 hours.  ABG No results for input(s): PHART, PCO2ART, PO2ART in the last 168 hours.  Liver  Enzymes  Recent Labs Lab 05/25/16 1240  AST 34  ALT 24  ALKPHOS 102  BILITOT 2.3*  ALBUMIN 2.8*    Cardiac Enzymes  Recent Labs Lab 05/25/16 2305 05/26/16 0435 05/26/16 0946  TROPONINI 0.06* 0.10* 0.06*    Glucose No results for input(s): GLUCAP in the last 168 hours.  Imaging Dg Chest Port 1 View  Result Date: 05/27/2016 CLINICAL DATA:  Loss of consciousness. EXAM: PORTABLE CHEST 1 VIEW COMPARISON:  05/25/2016 FINDINGS: Again noted is marked cardiomegaly. Diffuse pulmonary infiltrates are again identified, consistent with pulmonary edema and/or infectious process. IMPRESSION: Stable appearance of cardiomegaly and pulmonary infiltrates. Electronically Signed   By: Nolon Nations M.D.   On: 05/27/2016 14:52     STUDIES:  ECHO 10/4 >> EF 55%, PAP 42.  CULTURES:   ANTIBIOTICS:   SIGNIFICANT EVENTS: 10/3  Admit. 10/5  Transferred to ICU due to AMS, hypercarbic respiratory failure  LINES/TUBES:   DISCUSSION: [name] with PMH of super morbid obesity, HTN, pulmonary hypertension admitted with decompensated CHF.  Decompensated 10/5 with hypercarbic respiratory failure.      PULMONARY A: Acute on Chronic Hypoxemic and Hypercarbic Respiratory Failure. Secondary pulmonary hypertension. Suspected OSA / OHS with acute Decompensation  P:   BiPAP now and QHS. Repeat ABG at 1700. Continue aggressive diuresis as BP and SCr permit. Pulmonary hygiene. O2 as needed to support sats > 92% Will need ambulatory saturation assessment prior to discharge CXR intermittently.  CARDIOVASCULAR A:  Hypertensive Urgency. Acute diastolic heart failure. Elevated troponin - due to demand ischemia. Hx HTN. P:  Start nitro gtt. Hydralazine q4hrs (with holding parameters for SBP < 110). Goal SBP 120 - 130. Cardiology following, appreciate the assistance. Continue aggressive diuresis as BP and SCr permit. Trend troponins. Assess lactate. Hold preadmission labetalol,  nifedipine.  RENAL A:   AKI. P:   KVO fluids. Correct electrolytes as indicated. Trend BMP / UOP  GASTROINTESTINAL A:   Morbid obesity. Nutrition. P:   NPO. Dietician consult for calorie counts / diet education.  HEMATOLOGIC A:   VTE prophylaxis. P:  SCD's / Lovenox for DVT prophylaxis CBC in AM.  INFECTIOUS A:   No indication of infection. P:   Monitor clinically.  ENDOCRINE A:   No acute issues. P:   No interventions required.  NEUROLOGIC A:   Acute encephalopathy - in the setting of hypercarbia. P:   Monitor clinically. Limit sedating meds. Follow ABG.  REPRODUCTIVE A: R/o pregnancy - had "abd bloating" x 1 week upon presentation 10/3. P: Assess urine hCG.  FAMILY  - Updates: No family available 10/5  - Inter-disciplinary family meet or Palliative Care meeting due by: 10/12  Noe Gens, NP-C [facility] Pgr: [phone] or if no answer [phone]  05/27/2016, 3:50 PM   STAFF NOTE: I, Merrie Roof, MD FACP have personally reviewed patient's available data, including medical history, events of note, physical examination and test results as part of my evaluation. I have discussed with resident/NP and other care providers such as pharmacist, RN and RRT. In addition, I personally evaluated patient and elicited key findings of: super obesity, no distress, lethargic but improving and now awake, alert, ABG noted acidosis, nacrosis, likley etiology is CHF with additional underlying pulm HTN, we attempted NIMV bipap volumes low, change top PC vent pc 15, cpap 5, assess follow up abg in 2 hours, start higher dose lasix to neg balance goals 2 liters overnight, pcxr in am to follow, also with HTN afterload contribution, add Nitrates drip and add PRN hydralazine to sys 120 The patient is critically ill with multiple organ systems failure and requires high complexity decision making for assessment and support, frequent evaluation and  titration of therapies, application of advanced monitoring technologies and extensive interpretation of multiple databases.   Critical Care Time devoted to patient care services described in this note is 45 Minutes. This time reflects time of care of this signee: Merrie Roof, MD FACP. This critical care time does not reflect procedure time, or teaching time or supervisory time of PA/NP/Med student/Med Resident etc but could involve care discussion time. Rest per NP/medical resident whose note is outlined above and that I agree with   Lavon Paganini. Titus Mould, MD, [place] Pgr: [facility] 05/27/2016 4:31 PM

## 2016-05-27 NOTE — Progress Notes (Signed)
Patient noted to have shallow respirations with decreased level of consciousness.  MD notified and assessed patient as well.  See NO.  O2 on at 4 lpm.  Pupils 76m ou and equal / sluggish to light.  No medications prior to this episode given.  Rapid response notified of decline in patient's status.  Patient to be transferred to step-down.

## 2016-05-27 NOTE — Progress Notes (Signed)
[facility] Progress Note Patient Name: [name] [name] DOB: 03-30-1981 MRN: [id number]   Date of Service  05/27/2016  HPI/Events of Note  ABG on BiPAP = 7.228/93.6/129. Aggressive diuresis and Nitroglycerin IV infusion to treat CHF. Nurse reports that the patient is more alert.   eICU Interventions  Continue present management. Will order: 1. ABG at 11 PM.      Intervention Category Major Interventions: Respiratory failure - evaluation and management;Acid-Base disturbance - evaluation and management  Johsua Shevlin Eugene 05/27/2016, 6:43 PM

## 2016-05-27 NOTE — Significant Event (Signed)
Rapid Response Event Note  Overview: Time Called: 6811 Arrival Time: 5726 Event Type: Neurologic, Respiratory  Initial Focused Assessment: While on unit, asked to assess patient, patient was hard to arouse.  Upon seeing patient, patient was somnolent, breathing 4-6 breaths at best, no good air movement within lung fields, MD had seen patient and ordered ABG, CXR, and BIPAP.  Oxygen sats remained 86-90% on 4L, + pulses, + edema, CXR worrisome compared to previous. While assisting RT to obtain ABG, we noticed patient was agonal, patient immediately placed on BIPAP and urgently moved to ICU, CCM providers along side. Patient was hypertensive ( SBP 140-160s, MAPs in the low 100s).   Interventions: -- transferred to ICU per TRH and CCM  Plan of Care (if not transferred): -- VAT RN placed 2nd IV -- transferred to [place], assisted in placing foley -- family updated by team   Event Summary: Name of Physician Notified: Hospitalist by Primary RN at    Name of Consulting Physician Notified: CCM at    Outcome: Transferred (Comment)  Event End Time: [place], [place]

## 2016-05-28 ENCOUNTER — Inpatient Hospital Stay (HOSPITAL_COMMUNITY): Payer: PRIVATE HEALTH INSURANCE

## 2016-05-28 DIAGNOSIS — F3289 Other specified depressive episodes: Secondary | ICD-10-CM

## 2016-05-28 DIAGNOSIS — J9601 Acute respiratory failure with hypoxia: Secondary | ICD-10-CM

## 2016-05-28 DIAGNOSIS — Z79899 Other long term (current) drug therapy: Secondary | ICD-10-CM

## 2016-05-28 LAB — BLOOD GAS, ARTERIAL
Acid-Base Excess: 12.2 mmol/L — ABNORMAL HIGH (ref 0.0–2.0)
Acid-Base Excess: 16.2 mmol/L — ABNORMAL HIGH (ref 0.0–2.0)
Acid-Base Excess: 17 mmol/L — ABNORMAL HIGH (ref 0.0–2.0)
Acid-Base Excess: 18 mmol/L — ABNORMAL HIGH (ref 0.0–2.0)
Bicarbonate: 39.1 mmol/L — ABNORMAL HIGH (ref 20.0–28.0)
Bicarbonate: 43.2 mmol/L — ABNORMAL HIGH (ref 20.0–28.0)
Bicarbonate: 44.5 mmol/L — ABNORMAL HIGH (ref 20.0–28.0)
Bicarbonate: 45.8 mmol/L — ABNORMAL HIGH (ref 20.0–28.0)
Delivery systems: POSITIVE
Delivery systems: POSITIVE
Drawn by: 252031
Drawn by: 280981
Drawn by: 280981
Drawn by: 40415
Expiratory PAP: 5
Expiratory PAP: 5
Expiratory PAP: 5
FIO2: 0.4
FIO2: 0.4
FIO2: 0.5
FIO2: 40
Inspiratory PAP: 20
Inspiratory PAP: 20
Inspiratory PAP: 20
Mode: POSITIVE
Mode: POSITIVE
O2 Saturation: 95.1 %
O2 Saturation: 95.3 %
O2 Saturation: 97.6 %
O2 Saturation: 98.4 %
PEEP: 5 cmH2O
Patient temperature: 97.4
Patient temperature: 98.6
Patient temperature: 98.6
Patient temperature: 98.6
Pressure control: 15 cmH2O
Pressure support: 15 cmH2O
RATE: 15 resp/min
RATE: 20 resp/min
RATE: 20 resp/min
RATE: 20 resp/min
pCO2 arterial: 102 mmHg (ref 32.0–48.0)
pCO2 arterial: 105 mmHg (ref 32.0–48.0)
pCO2 arterial: 84.7 mmHg (ref 32.0–48.0)
pCO2 arterial: 89.8 mmHg (ref 32.0–48.0)
pH, Arterial: 7.26 — ABNORMAL LOW (ref 7.350–7.450)
pH, Arterial: 7.264 — ABNORMAL LOW (ref 7.350–7.450)
pH, Arterial: 7.286 — ABNORMAL LOW (ref 7.350–7.450)
pH, Arterial: 7.303 — ABNORMAL LOW (ref 7.350–7.450)
pO2, Arterial: 110 mmHg — ABNORMAL HIGH (ref 83.0–108.0)
pO2, Arterial: 126 mmHg — ABNORMAL HIGH (ref 83.0–108.0)
pO2, Arterial: 84.1 mmHg (ref 83.0–108.0)
pO2, Arterial: 89.4 mmHg (ref 83.0–108.0)

## 2016-05-28 LAB — BASIC METABOLIC PANEL
Anion gap: 10 (ref 5–15)
BUN: 29 mg/dL — ABNORMAL HIGH (ref 6–20)
CO2: 36 mmol/L — ABNORMAL HIGH (ref 22–32)
Calcium: 8.8 mg/dL — ABNORMAL LOW (ref 8.9–10.3)
Chloride: 95 mmol/L — ABNORMAL LOW (ref 101–111)
Creatinine, Ser: 1.04 mg/dL — ABNORMAL HIGH (ref 0.44–1.00)
GFR calc Af Amer: >60 mL/min (ref >60)
GFR calc non Af Amer: >60 mL/min (ref >60)
Glucose, Bld: 91 mg/dL (ref 65–99)
Potassium: 4.5 mmol/L (ref 3.5–5.1)
Sodium: 141 mmol/L (ref 135–145)

## 2016-05-28 LAB — HIV ANTIBODY (ROUTINE TESTING W REFLEX): HIV Screen 4th Generation wRfx: NONREACTIVE

## 2016-05-28 LAB — MRSA PCR SCREENING: MRSA by PCR: NEGATIVE

## 2016-05-28 LAB — TRIGLYCERIDES: Triglycerides: 116 mg/dL (ref <150)

## 2016-05-28 MED ORDER — PROPOFOL 10 MG/ML IV BOLUS
100.0000 mg | Freq: Once | INTRAVENOUS | Status: AC
Start: 2016-05-28 — End: 2016-05-28
  Administered 2016-05-28: 100 mg via INTRAVENOUS

## 2016-05-28 MED ORDER — LABETALOL HCL 200 MG PO TABS
300.0000 mg | ORAL_TABLET | Freq: Three times a day (TID) | ORAL | Status: DC
  Administered 2016-05-28 (×3): 300 mg via ORAL
  Filled 2016-05-28 (×3): qty 1

## 2016-05-28 MED ORDER — HYDRALAZINE HCL 50 MG PO TABS
50.0000 mg | ORAL_TABLET | Freq: Three times a day (TID) | ORAL | Status: DC
  Administered 2016-05-28 – 2016-06-04 (×21): 50 mg via ORAL
  Filled 2016-05-28 (×21): qty 1

## 2016-05-28 MED ORDER — PROPOFOL 1000 MG/100ML IV EMUL
5.0000 ug/kg/min | INTRAVENOUS | Status: DC
  Administered 2016-05-28: 5 ug/kg/min via INTRAVENOUS
  Administered 2016-05-28 (×2): 70 ug/kg/min via INTRAVENOUS
  Administered 2016-05-29: 45 ug/kg/min via INTRAVENOUS
  Administered 2016-05-29: 50 ug/kg/min via INTRAVENOUS
  Administered 2016-05-29 (×7): 70 ug/kg/min via INTRAVENOUS
  Filled 2016-05-28 (×11): qty 100

## 2016-05-28 MED ORDER — MORPHINE SULFATE (PF) 4 MG/ML IV SOLN
4.0000 mg | Freq: Once | INTRAVENOUS | Status: AC
  Administered 2016-05-28: 4 mg via INTRAVENOUS
  Filled 2016-05-28: qty 1

## 2016-05-28 MED ORDER — PROPOFOL 1000 MG/100ML IV EMUL
INTRAVENOUS | Status: AC
  Filled 2016-05-28: qty 100

## 2016-05-28 MED ORDER — FENTANYL CITRATE (PF) 100 MCG/2ML IJ SOLN
INTRAMUSCULAR | Status: AC
  Filled 2016-05-28: qty 2

## 2016-05-28 MED ORDER — CHLORHEXIDINE GLUCONATE 0.12% ORAL RINSE (MEDLINE KIT)
15.0000 mL | Freq: Two times a day (BID) | OROMUCOSAL | Status: DC
  Administered 2016-05-28: 15 mL via OROMUCOSAL

## 2016-05-28 MED ORDER — ORAL CARE MOUTH RINSE
15.0000 mL | OROMUCOSAL | Status: DC
  Administered 2016-05-29 (×4): 15 mL via OROMUCOSAL

## 2016-05-28 MED ORDER — HYDRALAZINE HCL 20 MG/ML IJ SOLN
10.0000 mg | INTRAMUSCULAR | Status: DC | PRN
  Administered 2016-05-29 – 2016-06-27 (×12): 10 mg via INTRAVENOUS
  Filled 2016-05-28 (×14): qty 1

## 2016-05-28 MED ORDER — MIDAZOLAM HCL 2 MG/2ML IJ SOLN
INTRAMUSCULAR | Status: AC
  Filled 2016-05-28: qty 2

## 2016-05-28 MED ORDER — ISOSORBIDE MONONITRATE ER 30 MG PO TB24
30.0000 mg | ORAL_TABLET | Freq: Every day | ORAL | Status: DC
  Administered 2016-05-28: 30 mg via ORAL
  Filled 2016-05-28: qty 1

## 2016-05-28 MED ORDER — SUCCINYLCHOLINE CHLORIDE 20 MG/ML IJ SOLN
100.0000 mg | Freq: Once | INTRAMUSCULAR | Status: AC
  Administered 2016-05-28: 100 mg via INTRAVENOUS

## 2016-05-28 NOTE — Progress Notes (Signed)
Rt Note: ABG done, results called back to Dr. Oletta Darter, no changes at this time, RN at bedside, family updated, will repeat ABG at 1900, RT will monitor

## 2016-05-28 NOTE — Procedures (Signed)
Endotracheal Intubation Procedure Note  Indication for endotracheal intubation: respiratory failure. Airway Assessment: Mallampati Class: IV (only hard palate visible). Sedation: propofol. Paralytic: succinylcholine. Lidocaine: no. Atropine: no. Equipment: Macintosh 3 laryngoscope blade and 7.37m cuffed endotracheal tube. Cricoid Pressure: no. Number of attempts: 1. ETT location confirmed by by auscultation, by CXR and ETCO2 monitor.  Latreece Mochizuki 05/28/2016

## 2016-05-28 NOTE — Progress Notes (Signed)
[facility] Progress Note Patient Name: Tamikka Pilger DOB: [birth year] MRN: [id number]   Date of Service  05/28/2016  HPI/Events of Note  ALOC - Currently on BiPAP.   eICU Interventions  Will order ABG STAT.     Intervention Category Major Interventions: Change in mental status - evaluation and management  Mikel Pyon [place] 05/28/2016, 5:41 PM

## 2016-05-28 NOTE — Progress Notes (Signed)
Assisted patient back to bed with +3 assist. Repositioned patient in bed. VSS. Moving back to bed toke much encouragement and persuasion. Emotional support given. Will continue to monitor.

## 2016-05-28 NOTE — Progress Notes (Signed)
Patient O2 mid 80's on 6L [place]. Pt placed back on bipap with 02 mid 90's. Will continue to monitor.

## 2016-05-28 NOTE — Evaluation (Signed)
Physical Therapy Evaluation Patient Details Name: [name] [name] MRN: [id number] DOB: 03-Nov-1980 Today's Date: 05/28/2016   History of Present Illness  [name] with PMH of morbid obesity (469 lbs), DM, HTN and tachycardia who presented to [facility] on 10/3 with reports of abdominal bloating and heavy abdominal pressure.    Clinical Impression  Pt admitted with above diagnosis. Pt currently with functional limitations due to the deficits listed below (see PT Problem List). Pt was able to ambulate with a RW.  Pt with very flat afffect and seems to zone out at times.  Will possibly need SNF as pt very weak.  Will follow acutely.  Pt will benefit from skilled PT to increase their independence and safety with mobility to allow discharge to the venue listed below.    Follow Up Recommendations SNF;Supervision/Assistance - 24 hour    Equipment Recommendations  Rolling walker with 5" wheels;3in1 (PT)    Recommendations for Other Services       Precautions / Restrictions Precautions Precautions: Fall Restrictions Weight Bearing Restrictions: No      Mobility  Bed Mobility Overal bed mobility: Needs Assistance Bed Mobility: Supine to Sit     Supine to sit: Mod assist;+2 for physical assistance     General bed mobility comments: Needed assist for LEs and for trunk elevation  Transfers Overall transfer level: Needs assistance Equipment used: Rolling walker (2 wheeled) Transfers: Sit to/from Stand Sit to Stand: Mod assist;+2 physical assistance;From elevated surface         General transfer comment: had to raise bed way up and assist to power up.  Ambulation/Gait Ambulation/Gait assistance: Min assist;Mod assist;+2 safety/equipment Ambulation Distance (Feet): 75 Feet Assistive device: Rolling walker (2 wheeled) Gait Pattern/deviations: Step-to pattern;Decreased stride length;Decreased step length - right;Decreased step length - left;Drifts right/left;Trunk flexed;Wide base of  support   Gait velocity interpretation: Below normal speed for age/gender General Gait Details: Pt was able to ambulate but with some impulsivity as well as unsteady gait at times.  Needed cues to slow down.  Pt with very flat affect and would not answer questions when asked if she was ok with ambulation.  Followed pt with chair.    Stairs            Wheelchair Mobility    Modified Rankin (Stroke Patients Only)       Balance Overall balance assessment: Needs assistance Sitting-balance support: Bilateral upper extremity supported;Feet supported Sitting balance-Leahy Scale: Poor Sitting balance - Comments: needed UE support   Standing balance support: Bilateral upper extremity supported;During functional activity Standing balance-Leahy Scale: Poor Standing balance comment: relies on UES for support.                             Pertinent Vitals/Pain Pain Assessment: No/denies pain  VSS on 6LO2 with ambulation.       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  Pt was going to move into [place] soon per mom    Prior Function Level of Independence: Independent               Hand Dominance  Extremity/Trunk Assessment   Upper Extremity Assessment: Defer to OT evaluation           Lower Extremity Assessment: RLE deficits/detail;LLE deficits/detail RLE Deficits / Details: grossly 2+/5 LLE Deficits / Details: grossly 2+/5  Cervical / Trunk Assessment: Kyphotic  Communication   Communication: No difficulties  Cognition Arousal/Alertness: Awake/alert Behavior During Therapy: Flat  affect (pt at times not responding and kept closing eyes) Overall Cognitive Status: Impaired/Different from baseline Area of Impairment: Orientation;Following commands;Safety/judgement;Awareness;Problem solving Orientation Level: Disoriented to;Situation;Time   Memory: Decreased recall of precautions;Decreased short-term memory Following Commands: Follows one step commands with increased time;Follows one step commands inconsistently Safety/Judgement: Decreased awareness of safety   Problem Solving: Slow processing;Decreased initiation;Difficulty sequencing;Requires verbal cues;Requires tactile cues      General Comments      Exercises     Assessment/Plan    PT Assessment Patient needs continued PT services  PT Problem List Decreased activity tolerance;Decreased balance;Decreased coordination;Decreased mobility;Decreased knowledge of use of DME;Decreased safety awareness;Decreased knowledge of precautions;Obesity;Decreased strength          PT Treatment Interventions DME instruction;Gait training;Functional mobility training;Therapeutic activities;Therapeutic exercise;Balance training;Patient/family education    PT Goals (Current goals can be found in the Care Plan section)  Acute Rehab PT Goals Patient Stated Goal: unable to state PT Goal Formulation: With patient Time For Goal Achievement: 06/11/16 Potential to Achieve Goals: Good    Frequency Min 3X/week   Barriers to discharge Decreased caregiver support      Co-evaluation               End of Session Equipment Utilized During Treatment: Gait belt;Oxygen Activity Tolerance: Patient limited by fatigue Patient left: in chair;with call bell/phone within reach;with chair alarm set;with family/visitor present Nurse Communication: Mobility status;Need for lift equipment         Time: 1126-1205 PT Time Calculation (min) (ACUTE ONLY): 39 min   Charges:   PT Evaluation $PT Eval Moderate Complexity: 1  Procedure PT Treatments $Gait Training: 23-37 mins   PT G CodesDenice [name] 06-08-2016, 4:22 PM [name] [name],PT Acute Rehabilitation [phone] (pager)

## 2016-05-28 NOTE — Progress Notes (Signed)
[facility] Progress Note Patient Name: Maisha Bogen DOB: 1980/08/31 MRN: [id number]   Date of Service  05/28/2016  HPI/Events of Note  ABG on BiPAP 20/5 = 7.26/102/89/49 Sat = 94%  eICU Interventions  Will order: 1. Continue BiPAP. 2. ABG at 7 PM.      Intervention Category Major Interventions: Acid-Base disturbance - evaluation and management;Respiratory failure - evaluation and management  Lysle Dingwall 05/28/2016, 5:47 PM

## 2016-05-28 NOTE — Progress Notes (Signed)
Noted patient to have decrease LOC with patient barely opening eyes to sternal rub. MD Oletta Darter notified. ABG ordered. Other VSS. Will continue to monitor.

## 2016-05-28 NOTE — Progress Notes (Signed)
Patient Name: [name] [name] Date of Encounter: 05/28/2016  Primary Cardiologist: [facility] Problem List     Principal Problem:   Acute congestive heart failure [facility]) Active Problems:   Malignant hypertension   Morbid obesity (HCC)   Sleep apnea   Renal insufficiency   Acute pulmonary edema (HCC)   Abdominal pain   Hepatic congestion   Sinus tachycardia   Pericardial effusion   Pulmonary hypertension   Acute respiratory failure with hypoxia (HCC)     Subjective   Somnolent on BiPAP, mother in room  Inpatient Medications    Scheduled Meds: . chlorhexidine  15 mL Mouth Rinse BID  . enoxaparin (LOVENOX) injection  100 mg Subcutaneous Q24H  . fluconazole  100 mg Oral Daily  . fluconazole  200 mg Oral Once  . furosemide  80 mg Intravenous Q6H  . hydrALAZINE  10 mg Intravenous Q4H  . labetalol  200 mg Oral BID  . loratadine  10 mg Oral Daily  . mouth rinse  15 mL Mouth Rinse q12n4p  . potassium chloride  20 mEq Oral Daily  . sodium chloride flush  3 mL Intravenous Q12H   Continuous Infusions: . nitroGLYCERIN 200 mcg/min (05/28/16 0756)   PRN Meds:.acetaminophen **OR** acetaminophen, ondansetron **OR** ondansetron (ZOFRAN) IV   Vital Signs    Vitals:   05/28/16 0600 05/28/16 0700 05/28/16 0800 05/28/16 0827  BP: (!) 147/86 (!) 148/85 (!) 151/91 (!) 151/91  Pulse: 92 92 95 96  Resp: 20 20 (!) 30 (!) 23  Temp:   98.5 F (36.9 C)   TempSrc:   Axillary   SpO2: 94% 94% 96% 97%  Weight:      Height:        Intake/Output Summary (Last 24 hours) at 05/28/16 0907 Last data filed at 05/28/16 0800  Gross per 24 hour  Intake           541.92 ml  Output             6405 ml  Net         -5863.08 ml   Filed Weights   05/26/16 0520 05/27/16 0649 05/28/16 0500  Weight: (!) 465 lb 1.6 oz (211 kg) (!) 469 lb (212.7 kg) (!) 464 lb (210.5 kg)    Physical Exam    GEN: Laying in bed, in no acute distress.  HEENT: Grossly normal. BIPAP Neck: Supple, no JVD,  carotid bruits, or masses. Cardiac: RRR, no murmurs, rubs, or gallops. No clubbing, cyanosis, edema.  Radials/DP/PT 2+ and equal bilaterally.  Respiratory:  Respirations regular increased effort noted, clear to auscultation bilaterally, decreased BS. GI: Soft, nontender, nondistended, BS + x 4. obese MS: no deformity or atrophy. Skin: warm and dry, no rash. Neuro:  Strength and sensation are intact. Psych: Somnolent  Labs    CBC  Recent Labs  05/25/16 1240 05/27/16 0339  WBC 9.8 6.0  NEUTROABS 8.3*  --   HGB 14.3 13.5  HCT 47.9* 46.9*  MCV 80.8 85.7  PLT 191 622   Basic Metabolic Panel  Recent Labs  05/27/16 1636 05/28/16 0205  NA 139 141  K 5.1 4.5  CL 97* 95*  CO2 35* 36*  GLUCOSE 121* 91  BUN 31* 29*  CREATININE 1.35* 1.04*  CALCIUM 8.5* 8.8*  MG 1.9  --   PHOS 5.6*  --    Liver Function Tests  Recent Labs  05/25/16 1240  AST 34  ALT 24  ALKPHOS 102  BILITOT 2.3*  PROT 7.5  ALBUMIN 2.8*   No results for input(s): LIPASE, AMYLASE in the last 72 hours. Cardiac Enzymes  Recent Labs  05/25/16 2305 05/26/16 0435 05/26/16 0946  TROPONINI 0.06* 0.10* 0.06*   BNP Invalid input(s): POCBNP D-Dimer  Recent Labs  05/25/16 1240  DDIMER 2.59*   Hemoglobin A1C  Recent Labs  05/25/16 2148  HGBA1C 7.9*   Fasting Lipid Panel  Recent Labs  05/26/16 0435  CHOL 128  HDL 20*  LDLCALC 92  TRIG 79  CHOLHDL 6.4   Thyroid Function Tests No results for input(s): TSH, T4TOTAL, T3FREE, THYROIDAB in the last 72 hours.  Invalid input(s): FREET3  Telemetry    NSR - Personally Reviewed  ECG    NSR, PRWP- Personally Reviewed  Radiology    Dg Chest Port 1 View  Result Date: 05/28/2016 CLINICAL DATA:  Acute CHF. EXAM: PORTABLE CHEST 1 VIEW COMPARISON:  05/27/2016. FINDINGS: Cardiomegaly with pulmonary vascular prominence and bilateral interstitial prominence noted consistent with CHF. Similar findings noted on prior exam. Small pleural  effusions cannot be excluded. No pneumothorax . IMPRESSION: Congestive heart failure with pulmonary interstitial edema. Small bilateral pleural effusions cannot be excluded. No significant interim change from prior exam. Electronically Signed   By: [place]   On: 05/28/2016 07:14   Dg Chest Port 1 View  Result Date: 05/27/2016 CLINICAL DATA:  Loss of consciousness. EXAM: PORTABLE CHEST 1 VIEW COMPARISON:  05/25/2016 FINDINGS: Again noted is marked cardiomegaly. Diffuse pulmonary infiltrates are again identified, consistent with pulmonary edema and/or infectious process. IMPRESSION: Stable appearance of cardiomegaly and pulmonary infiltrates. Electronically Signed   By: Nolon Nations M.D.   On: 05/27/2016 14:52     Cardiac Studies   CT scan chest: No PE, but dilated pulmonary artery (66m).  ECHO: - Left ventricle: The cavity size was normal. Systolic function was   normal. The estimated ejection fraction was in the range of 50%   to 55%. Regional wall motion abnormalities cannot be excluded.   The study is not technically sufficient to allow evaluation of LV   diastolic function. - Left atrium: The atrium was mildly dilated. - Right atrium: The atrium was mildly dilated. - Pulmonary arteries: Systolic pressure was mildly increased. PA   peak pressure: 42 mm Hg (S).  Impressions:  - Extremely limited; definity used; probable low normal LV sstolci   function; mild biatrial enlargement; mild TR; mildly elevated   pulmonary pressure; suggest MUGA or cardiac MRI if clinically   indicated.  Patient Profile     35year old with super morbid obesity, secondary pulmonary hypertension, acute diastolic heart failure, obesity hypoventilation syndrome, hypercarbia.  Assessment & Plan    Acute diastolic HF  -ECHO with normal EF but mild pulm HTN and dilated PA.   - Would continue with lasix emperically and watch for change in creat. Currently stable, reduced.  - Difficult to  assess fluid status.  Obesity  - Causing pulm htn  - weight loss  - BIPAP  Secondary pulm htn  - as above  - weight loss  - BP control  - small pericardial effusion should be of no clinical concern.   Elevated troponin/ demand ischemia  - low level troponin secondary to acute heart strain/failure.   Essential hypertension  - BP improved, see meds.   - NTG IV  - Hydral  - Lasix.   - would be OK with transition to metoprolol or labetalol IV if unable to administer PO  Hypercarbic  respiratory failure, obesity hypoventilation syndrome  - CCM following  - BIPAP titration  - PCO2 as high as >100, pH 7.16>>>>improved   - in CCU  Discussed with family. They were concerned about underlying depression.   Signed, Candee Furbish, MD  05/28/2016, 9:07 AM

## 2016-05-28 NOTE — Progress Notes (Signed)
Inpatient Diabetes Program Recommendations  AACE/ADA: New Consensus Statement on Inpatient Glycemic Control (2015)  Target Ranges:  Prepandial:   less than 140 mg/dL      Peak postprandial:   less than 180 mg/dL (1-2 hours)      Critically ill patients:  140 - 180 mg/dL   Lab Results  Component Value Date   GLUCAP 89 05/25/2013   HGBA1C 7.9 (H) 05/25/2016   Review of Glycemic Control  Diabetes history: Gestational Diabetes Only   Inpatient Diabetes Program Recommendations:   Noted that HgbA1C is 7.9%. Patient has had gestational diabetes in the past. According to American Diabetes Association, HgbA1c of 6.5% is a diagnosis of Diabetes.  Will need to be notified of diagnosis before being given instruction. Recommend checking blood sugars while in the hospital TID & HS. Will need follow up with PCP for glucose control.  Thanks,  Tama Headings RN, MSN, [facility] Inpatient Diabetes Coordinator Team Pager [phone] (8a-5p)

## 2016-05-28 NOTE — Progress Notes (Signed)
Dr. Ashok Cordia called and notified about placement of ET Tube and OG tube. Placement verified and ET tube and OG are in the correct place and OG tube can used for meds per Dr. Ashok Cordia. Verbal order to get ABG.

## 2016-05-28 NOTE — Progress Notes (Signed)
[place] Progress Note Patient Name: [name] [name] DOB: 03/31/1981 MRN: [id number]   Date of Service  05/28/2016  HPI/Events of Note  ABG on BiPAP IPAP 20/EPAP 5 = 7.26/105/84/45.8. Patient remains with ALOC. Likely needs intubation and ventilation.   eICU Interventions  Will ask Dr. Ancil Linsey, on call bedside physician, to evaluate the patient for intubation.      Intervention Category Major Interventions: Acid-Base disturbance - evaluation and management;Respiratory failure - evaluation and management  [name] 05/28/2016, 8:06 PM

## 2016-05-28 NOTE — Progress Notes (Signed)
Nutrition Brief Note  Consult received for diet instruction for pt who is morbidly obese. Also noted pt with likely new DM.   During interview pt would respond to questions with one word answers and then appear to fall asleep. Cousin at bedside and she reports that pt was conversing easily 30 minutes ago. Noted in cardiology notes that family is concerned about depression.   Pt does not appear ready for diet education at this time. RD did leave behind education materials 10/5 with contact information. Will follow to determine if pt becomes appropriate for diet education while she is an inpatient.  Recommend outpatient education referral for weight loss and liekly diabetes management.    Body mass index is 77.21 kg/m. Patient meets criteria for morbid obesity based on current BMI.   Current diet order is Clear liquids, patient is consuming approximately 65-100% of meals at this time. Labs and medications reviewed.   Lab Results  Component Value Date   HGBA1C 7.9 (H) 05/25/2016   Pt with history of uncontrolled hypertension, gestational diabetes, morbid obesity, and medical noncompliance who reports a 1-2 week history of progressive dyspnea on exertion and orthopnea with lower extremity and abdominal wall edema.    [place], [place], [place] Pager [phone] After Hours Pager

## 2016-05-28 NOTE — Consult Note (Signed)
PULMONARY / CRITICAL CARE MEDICINE   Name: [name] [name] MRN: [id number] DOB: 1980-09-28    ADMISSION DATE:  05/25/2016 CONSULTATION DATE:  05/27/16  REFERRING MD:  Dr. Ree Kida   CHIEF COMPLAINT:  Altered Mental Status   HISTORY OF PRESENT ILLNESS:   [name] with PMH of morbid obesity (469 lbs), DM, HTN and tachycardia who presented to [facility] on 10/3 with reports of abdominal bloating and heavy abdominal pressure.    On admit, the patient reported she had not taken her blood pressure medications in over one year.  She was profoundly hypertensive on admit as well, initial BP 215/151.  She was found to be hypoxic with saturations in the 70's which improved with 3L O2.  D-Dimer was elevated at 2.59 and a CTA of the chest showed CHF but no PE.  BNP 594.  The patient was admitted per [facility] and treated for acute decompensated CHF.  She was treated with IV diuresis, blood pressure control, afterload reduction and fluid restriction.  ECHO assessment revealed LVEF 50-55%, difficult to assess LV diastolic function, mildly dilated lA, RA, PA peak 42 mm Hg.  On 10/5 afternoon, the patient was found to be altered and difficult to arouse.  Rapid response team was activated.  ABG assessed > 7.16 & pCO2 > 100.  The patient placed on BiPAP and transferred to ICU.  With stimulation of transfer, the patient woke some and was able to interact with staff.    PCCM called for ICU assessment.      SUBJECTIVE:  RN reports pt remains on bipap, ntg gtt continues & BP remains elevated in 993'Z systolic.  Net negative 5.5 L in last 24 hours with improvement in sr cr  VITAL SIGNS: BP (!) 151/91   Pulse 96   Temp 98.5 F (36.9 C) (Axillary)   Resp (!) 23   Ht _0  (1.651 m)   Wt (!) 464 lb (210.5 kg)   SpO2 97%   BMI 77.21 kg/m   HEMODYNAMICS:    VENTILATOR SETTINGS: Vent Mode: Other (Comment) FiO2 (%):  [30 %-50 %] 40 % Set Rate:  [20 bmp] 20 bmp PEEP:  [5 cmH20] 5 cmH20  INTAKE / OUTPUT: I/O last 3  completed shifts: In: 841.9 [P.O.:360; I.V.:481.9] Out: 6480 [Urine:6480]  PHYSICAL EXAMINATION: General:  Morbidly obese female on BiPAP  Neuro:  Awake, alert, MAE  HEENT:  MM pink/moist, unable to assess JVD  Cardiovascular:  s1s2 rrr, tachy, distant tones  Lungs:  Shallow, non-labored, lungs bilaterally diminished but clear  Abdomen:  Obese, soft, bsx4 active  Musculoskeletal:  No acute deformities  Skin:  Warm/dry, generalized edema  LABS:  BMET  Recent Labs Lab 05/27/16 0339 05/27/16 1636 05/28/16 0205  NA 139 139 141  K 4.4 5.1 4.5  CL 95* 97* 95*  CO2 35* 35* 36*  BUN 31* 31* 29*  CREATININE 1.29* 1.35* 1.04*  GLUCOSE 117* 121* 91    Electrolytes  Recent Labs Lab 05/27/16 0339 05/27/16 1636 05/28/16 0205  CALCIUM 8.6* 8.5* 8.8*  MG  --  1.9  --   PHOS  --  5.6*  --     CBC  Recent Labs Lab 05/25/16 1240 05/27/16 0339  WBC 9.8 6.0  HGB 14.3 13.5  HCT 47.9* 46.9*  PLT 191 189    Coag's  Recent Labs Lab 05/26/16 0435  APTT 32  INR 1.28    Sepsis Markers  Recent Labs Lab 05/27/16 1636  LATICACIDVEN 1.7  ABG  Recent Labs Lab 05/27/16 1740 05/28/16 0005 05/28/16 0835  PHART 7.228* 7.286* 7.303*  PCO2ART 93.6* 84.7* 89.8*  PO2ART 129* 126* 110.0*    Liver Enzymes  Recent Labs Lab 05/25/16 1240  AST 34  ALT 24  ALKPHOS 102  BILITOT 2.3*  ALBUMIN 2.8*    Cardiac Enzymes  Recent Labs Lab 05/25/16 2305 05/26/16 0435 05/26/16 0946  TROPONINI 0.06* 0.10* 0.06*    Glucose No results for input(s): GLUCAP in the last 168 hours.  Imaging Dg Chest Port 1 View  Result Date: 05/28/2016 CLINICAL DATA:  Acute CHF. EXAM: PORTABLE CHEST 1 VIEW COMPARISON:  05/27/2016. FINDINGS: Cardiomegaly with pulmonary vascular prominence and bilateral interstitial prominence noted consistent with CHF. Similar findings noted on prior exam. Small pleural effusions cannot be excluded. No pneumothorax . IMPRESSION: Congestive heart  failure with pulmonary interstitial edema. Small bilateral pleural effusions cannot be excluded. No significant interim change from prior exam. Electronically Signed   By: [place]   On: 05/28/2016 07:14   Dg Chest Port 1 View  Result Date: 05/27/2016 CLINICAL DATA:  Loss of consciousness. EXAM: PORTABLE CHEST 1 VIEW COMPARISON:  05/25/2016 FINDINGS: Again noted is marked cardiomegaly. Diffuse pulmonary infiltrates are again identified, consistent with pulmonary edema and/or infectious process. IMPRESSION: Stable appearance of cardiomegaly and pulmonary infiltrates. Electronically Signed   By: Nolon Nations M.D.   On: 05/27/2016 14:52     STUDIES:  ECHO 10/4 >> EF 55%, PAP 42.  CULTURES:   ANTIBIOTICS:   SIGNIFICANT EVENTS: 10/3  Admit. 10/5  Transferred to ICU due to AMS, hypercarbic respiratory failure  LINES/TUBES:   DISCUSSION: [name] with PMH of super morbid obesity, HTN, pulmonary hypertension admitted with decompensated CHF.  Decompensated 10/5 with hypercarbic respiratory failure.      PULMONARY A: Acute on Chronic Hypoxemic and Hypercarbic Respiratory Failure. Secondary pulmonary hypertension. Severe OSA / OHS with acute Decompensation - sleep study in 2012 with up to 75 events per hour with desaturation to 72% P:   Mandatory BiPAP QHS + cycle on / off during the day    Continue aggressive diuresis as BP and SCr permit. Pulmonary hygiene. O2 as needed to support sats > 92% Will need ambulatory saturation assessment prior to discharge CXR intermittently.  CARDIOVASCULAR A:  Hypertensive Urgency. Acute diastolic heart failure. Elevated troponin - due to demand ischemia. Hx HTN. P:  Wean nitro gtt to off. Hydralazine 50 mg QD  Increase labetalol to 300 mg TID Add Imdur 30 mg QD PRN Hydralazine for SBP > 150 (with holding parameters for SBP < 110). Goal SBP 120 - 130. Cardiology following, appreciate the assistance. Continue aggressive  diuresis as BP and SCr permit.  RENAL A:   AKI - improving P:   KVO fluids. Correct electrolytes as indicated. Trend BMP / UOP  GASTROINTESTINAL A:   Morbid obesity. Nutrition. P:   NPO. Dietician consult for calorie counts / diet education. Pt will need outpatient referral to Dr. Hassell Done for consideration of bypass surgery  HEMATOLOGIC A:   VTE prophylaxis. P:  SCD's / Lovenox for DVT prophylaxis CBC in AM.  INFECTIOUS A:   No indication of infection. P:   Monitor clinically.  ENDOCRINE A:   No acute issues. P:   No interventions required.  NEUROLOGIC A:   Acute encephalopathy - in the setting of hypercarbia. Depression - family reports pt is in "denial" P:   Monitor clinically. Limit sedating meds. Follow ABG intermittently   REPRODUCTIVE  A: R/o pregnancy - had "abd bloating" x 1 week upon presentation 10/3, negative P: No acute interventions  FAMILY  - Updates: Updated at bedside.    - Inter-disciplinary family meet or Palliative Care meeting due by: 10/12  Noe Gens, NP-C [facility] Pgr: [phone] or if no answer [phone] 05/28/2016, 9:31 AM  STAFF NOTE: I, Merrie Roof, MD FACP have personally reviewed patient's available data, including medical history, events of note, physical examination and test results as part of my evaluation. I have discussed with resident/NP and other care providers such as pharmacist, RN and RRT. In addition, I personally evaluated patient and elicited key findings of: alert this am , no distress, neg 5.5 liters, less coarse BS, responded to lasix well and afterload control, pcxr resolving edema, maintain lasix to neg balance, can consider transition off NTG drip and add oral hydral, increase oral labetolol and continued IV hydral, add NTG drip, should continued NIMB scheduled with less time on , she requires mandatory nocturnal BIPAP for sever eosa and OHS, add clear to liquid diet, I  updated pt and family in room The patient is critically ill with multiple organ systems failure and requires high complexity decision making for assessment and support, frequent evaluation and titration of therapies, application of advanced monitoring technologies and extensive interpretation of multiple databases.   Critical Care Time devoted to patient care services described in this note is 35 Minutes. This time reflects time of care of this signee: Merrie Roof, MD FACP. This critical care time does not reflect procedure time, or teaching time or supervisory time of PA/NP/Med student/Med Resident etc but could involve care discussion time. Rest per NP/medical resident whose note is outlined above and that I agree with   Lavon Paganini. Titus Mould, MD, [place] Pgr: [facility] 05/28/2016 6:13 PM

## 2016-05-28 NOTE — Progress Notes (Signed)
[facility] ABG values called to [facility] RN at 8:43 PH 7.330 PC02 89.9 P02 110.0 HC03 43.2

## 2016-05-28 NOTE — Progress Notes (Signed)
RT pulled tube back from 24 at the lip to 22 at the lip per Dr. Ancil Linsey.

## 2016-05-28 NOTE — Progress Notes (Signed)
[place] Progress Note Patient Name: [name] [name] DOB: 1981-04-14 MRN: [id number]   Date of Service  05/28/2016  HPI/Events of Note  Nurse calls regarding ABG results.  PH 7.29, PCO2 85, PO2 126 on BiPAP 20/5, 50% FiO2.  Patient clinically more awake, improved to when she came in.   Patient seen, comfortable, asleep, with the BiPAP on. Blood pressure 1:30/72, pulse 90, respiratory rate 23, sats 99%.   eICU Interventions  Continue BiPAP. We'll need to tweak BiPAP to increase her tidal volumes around 600 ML's. Try to decrease her FiO2 to 30-40 percent, keep her sats more than 88%.         [place] 05/28/2016, 12:37 AM

## 2016-05-28 NOTE — Consult Note (Signed)
[facility] Consult   Reason for Consult:  Depression Referring Physician:  Dr. Titus Mould Patient Identification: [name] [name] MRN:  [id number] Principal Diagnosis: Acute congestive heart failure [facility]) Diagnosis:   Patient Active Problem List   Diagnosis Date Noted  . Acute respiratory failure with hypoxia ([place]) [J96.01]   . Hepatic congestion [K76.1] 05/26/2016  . Sinus tachycardia [R00.0] 05/26/2016  . Pericardial effusion [I31.3] 05/26/2016  . Pulmonary hypertension [I27.20]   . Renal insufficiency [N28.9] 05/25/2016  . Abnormal EKG [R94.31] 05/25/2016  . Acute pulmonary edema ([place]) [J81.0] 05/25/2016  . Abdominal pain [R10.9] 05/25/2016  . CHF (congestive heart failure) ([place]) [I50.9] 05/25/2016  . Acute congestive heart failure ([place]) [I50.9] 05/25/2016  . Oligohydramnios, delivered [O41.00X0] 05/27/2013  . NSVD (normal spontaneous vaginal delivery) [O80] 05/27/2013  . Pregnancy [Z34.90] 11/02/2012  . Sleep apnea [G47.30] 07/09/2011  . 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 Morbid obesity ([place]) [E66.01] 05/18/2011    Total Time spent with patient: 1 hour  Subjective:   [name] [name] is a 35 y.o. female admitted with abdominal bloating, and profoundly hypertensive.  HPI:  [name] [name] is a 35 years old female, seen, chart reviewed for the face-to-face psychiatric consultation and evaluation of depression. Patient appeared lying in her bed with morbid obesity, reportedly suffering with diabetes and hypertension. Patient admitted to hospital secondary to noncompliant with her medication management for hypertension and presented with abdominal bloating and malignant hypertension. Patient mother reported to the staff that she has been depressed and at the same time being and denial. Patient could not answer why she was not able to care for her own medical problems. Patient reported she is not feeling depressed, sad, unhappy, denies being irritable, agitated and  loss of interest. Patient does endorses generalized weakness, poor energy and sleep difficulties. Patient reported that she has no suicidal/homicidal ideation, intention or plans. Patient has no evidence of psychosis. Patient refused when offered medication management for depression and counseling emotional support. Patient was informed that she can contact the provider if she changed her mind in near future. Patient also reported that she has been working with mental health at [facility] and has knowledge about mental health and needed treatment. Patient is more resourceful about the available mental health services in the community.    Past Psychiatric History: Denied  Risk to Self: Is patient at risk for suicide?: No Risk to Others:   Prior Inpatient Therapy:   Prior Outpatient Therapy:    Past Medical History:  Past Medical History:  Diagnosis Date  . Diabetes mellitus without complication (HCC)    GDM  . Gestational diabetes mellitus in pregnancy 05/24/2013  . HTN in pregnancy, chronic 05/24/2013  . Hypertension   . Morbid obesity ([place])   . Sinus tachycardia    History reviewed. No pertinent surgical history. Family History:  Family History  Problem Relation Age of Onset  . Hypertension Mother   . Diabetes Other    Family Psychiatric  History: none reported Social History:  History  Alcohol Use No     History  Drug Use No    Social History   Social History  . Marital status: Single    Spouse name: N/A  . Number of children: N/A  . Years of education: N/A   Occupational History  . 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 Smoking status: Never Smoker  . Smokeless tobacco: Never Used  . Alcohol use No  . Drug use:  No  . Sexual activity: Not Asked   Other Topics Concern  . 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   DENIES ANY TOBACCO OR ETOH USE         Additional Social  History:    Allergies:  No Known Allergies  Labs:  Results for orders placed or performed during the hospital encounter of 05/25/16 (from the past 48 hour(s))  Basic metabolic panel     Status: Abnormal   Collection Time: 05/27/16  3:39 AM  Result Value Ref Range   Sodium 139 135 - 145 mmol/L   Potassium 4.4 3.5 - 5.1 mmol/L   Chloride 95 (L) 101 - 111 mmol/L   CO2 35 (H) 22 - 32 mmol/L   Glucose, Bld 117 (H) 65 - 99 mg/dL   BUN 31 (H) 6 - 20 mg/dL   Creatinine, Ser 1.29 (H) 0.44 - 1.00 mg/dL   Calcium 8.6 (L) 8.9 - 10.3 mg/dL   GFR calc non Af Amer 53 (L) >60 mL/min   GFR calc Af Amer >60 >60 mL/min    Comment: (NOTE) The eGFR has been calculated using the CKD EPI equation. This calculation has not been validated in all clinical situations. eGFR's persistently <60 mL/min signify possible Chronic Kidney Disease.    Anion gap 9 5 - 15  CBC     Status: Abnormal   Collection Time: 05/27/16  3:39 AM  Result Value Ref Range   WBC 6.0 4.0 - 10.5 K/uL   RBC 5.47 (H) 3.87 - 5.11 MIL/uL   Hemoglobin 13.5 12.0 - 15.0 g/dL   HCT 46.9 (H) 36.0 - 46.0 %   MCV 85.7 78.0 - 100.0 fL   MCH 24.7 (L) 26.0 - 34.0 pg   MCHC 28.8 (L) 30.0 - 36.0 g/dL   RDW 21.0 (H) 11.5 - 15.5 %   Platelets 189 150 - 400 K/uL  HIV antibody     Status: None   Collection Time: 05/27/16 11:08 AM  Result Value Ref Range   HIV Screen 4th Generation wRfx Non Reactive Non Reactive    Comment: (NOTE) Performed At: [facility] [place], [place] [id number] Lindon Romp MD UP:[id number]   Pregnancy, urine     Status: None   Collection Time: 05/27/16  4:12 PM  Result Value Ref Range   Preg Test, Ur NEGATIVE NEGATIVE    Comment:        THE SENSITIVITY OF THIS METHODOLOGY IS >20 mIU/mL.   Basic metabolic panel     Status: Abnormal   Collection Time: 05/27/16  4:36 PM  Result Value Ref Range   Sodium 139 135 - 145 mmol/L   Potassium 5.1 3.5 - 5.1 mmol/L   Chloride 97 (L) 101 - 111  mmol/L   CO2 35 (H) 22 - 32 mmol/L   Glucose, Bld 121 (H) 65 - 99 mg/dL   BUN 31 (H) 6 - 20 mg/dL   Creatinine, Ser 1.35 (H) 0.44 - 1.00 mg/dL   Calcium 8.5 (L) 8.9 - 10.3 mg/dL   GFR calc non Af Amer 50 (L) >60 mL/min   GFR calc Af Amer 58 (L) >60 mL/min    Comment: (NOTE) The eGFR has been calculated using the CKD EPI equation. This calculation has not been validated in all clinical situations. eGFR's persistently <60 mL/min signify possible Chronic Kidney Disease.    Anion gap 7 5 - 15  Magnesium     Status: None   Collection Time: 05/27/16  4:36 PM  Result Value Ref Range   Magnesium 1.9 1.7 - 2.4 mg/dL  Phosphorus     Status: Abnormal   Collection Time: 05/27/16  4:36 PM  Result Value Ref Range   Phosphorus 5.6 (H) 2.5 - 4.6 mg/dL  Lactic acid, plasma     Status: None   Collection Time: 05/27/16  4:36 PM  Result Value Ref Range   Lactic Acid, Venous 1.7 0.5 - 1.9 mmol/L  Blood gas, arterial     Status: Abnormal   Collection Time: 05/27/16  5:40 PM  Result Value Ref Range   FIO2 0.50    Delivery systems BILEVEL POSITIVE AIRWAY PRESSURE    LHR 15.0 resp/min   Inspiratory PAP 20    Expiratory PAP 5    pH, Arterial 7.228 (L) 7.350 - 7.450   pCO2 arterial 93.6 (HH) 32.0 - 48.0 mmHg    Comment: CRITICAL RESULT CALLED TO, READ BACK BY AND VERIFIED WITH: JESSE CLEVER RN AT 1749 BY TIM SNIDER RRT,RCP ON 05/27/2016    pO2, Arterial 129 (H) 83.0 - 108.0 mmHg   Bicarbonate 37.6 (H) 20.0 - 28.0 mmol/L   Acid-Base Excess 10.1 (H) 0.0 - 2.0 mmol/L   O2 Saturation 98.1 %   Patient temperature 98.6    Collection site LEFT RADIAL    Drawn by [id number]    Sample type ARTERIAL DRAW    Allens test (pass/fail) PASS PASS  MRSA PCR Screening     Status: None   Collection Time: 05/27/16 11:56 PM  Result Value Ref Range   MRSA by PCR NEGATIVE NEGATIVE    Comment:        The GeneXpert MRSA Assay (FDA approved for NASAL specimens only), is one component of a comprehensive MRSA  colonization surveillance program. It is not intended to diagnose MRSA infection nor to guide or monitor treatment for MRSA infections.   Blood gas, arterial     Status: Abnormal   Collection Time: 05/28/16 12:05 AM  Result Value Ref Range   FIO2 0.50    Delivery systems BILEVEL POSITIVE AIRWAY PRESSURE    LHR 15 resp/min   Inspiratory PAP 20    Expiratory PAP 5.0    Pressure support 15 cm H20   pH, Arterial 7.286 (L) 7.350 - 7.450   pCO2 arterial 84.7 (HH) 32.0 - 48.0 mmHg    Comment: CRITICAL RESULT CALLED TO, READ BACK BY AND VERIFIED WITH: MARIAH NORMANT RN AT 0015 BY TIM SNIDER RRT,RCP ON 05/27/2016    pO2, Arterial 126 (H) 83.0 - 108.0 mmHg   Bicarbonate 39.1 (H) 20.0 - 28.0 mmol/L   Acid-Base Excess 12.2 (H) 0.0 - 2.0 mmol/L   O2 Saturation 98.4 %   Patient temperature 98.6    Collection site LEFT RADIAL    Drawn by [id number]    Sample type ARTERIAL DRAW    Allens test (pass/fail) PASS PASS  Basic metabolic panel     Status: Abnormal   Collection Time: 05/28/16  2:05 AM  Result Value Ref Range   Sodium 141 135 - 145 mmol/L   Potassium 4.5 3.5 - 5.1 mmol/L   Chloride 95 (L) 101 - 111 mmol/L   CO2 36 (H) 22 - 32 mmol/L   Glucose, Bld 91 65 - 99 mg/dL   BUN 29 (H) 6 - 20 mg/dL   Creatinine, Ser 1.04 (H) 0.44 - 1.00 mg/dL   Calcium 8.8 (L)  8.9 - 10.3 mg/dL   GFR calc non Af Amer >60 >60 mL/min   GFR calc Af Amer >60 >60 mL/min    Comment: (NOTE) The eGFR has been calculated using the CKD EPI equation. This calculation has not been validated in all clinical situations. eGFR's persistently <60 mL/min signify possible Chronic Kidney Disease.    Anion gap 10 5 - 15  Blood gas, arterial     Status: Abnormal   Collection Time: 05/28/16  8:35 AM  Result Value Ref Range   FIO2 0.40    Delivery systems VENTILATOR    Mode BILEVEL POSITIVE AIRWAY PRESSURE    LHR 20.0 resp/min   Inspiratory PAP 20.0    Expiratory PAP 5.0    pH, Arterial 7.303 (L) 7.350 - 7.450   pCO2  arterial 89.8 (HH) 32.0 - 48.0 mmHg   pO2, Arterial 110.0 (H) 83.0 - 108.0 mmHg   Bicarbonate 43.2 (H) 20.0 - 28.0 mmol/L   Acid-Base Excess 16.2 (H) 0.0 - 2.0 mmol/L   O2 Saturation 97.6 %   Patient temperature 98.6    Collection site LEFT RADIAL    Drawn by [id number]    Sample type ARTERIAL DRAW    Allens test (pass/fail) PASS PASS    Current Facility-Administered Medications  Medication Dose Route Frequency Provider Last Rate Last Dose  . acetaminophen (TYLENOL) tablet 650 mg  650 mg Oral Q6H PRN Lily Kocher, MD       Or  . acetaminophen (TYLENOL) suppository 650 mg  650 mg Rectal Q6H PRN Lily Kocher, MD      . chlorhexidine (PERIDEX) 0.12 % solution 15 mL  15 mL Mouth Rinse BID Maryann Mikhail, DO   15 mL at 05/28/16 1000  . enoxaparin (LOVENOX) injection 100 mg  100 mg Subcutaneous Q24H Skeet Simmer, RPH   100 mg at 05/27/16 2300  . fluconazole (DIFLUCAN) tablet 100 mg  100 mg Oral Daily Maryann Mikhail, DO      . fluconazole (DIFLUCAN) tablet 200 mg  200 mg Oral Once [employer], DO      . furosemide (LASIX) injection 80 mg  80 mg Intravenous Q6H Donita Brooks, NP   80 mg at 05/28/16 8309  . hydrALAZINE (APRESOLINE) injection 10 mg  10 mg Intravenous Q4H PRN Raylene Miyamoto, MD      . hydrALAZINE (APRESOLINE) tablet 50 mg  50 mg Oral Q8H Raylene Miyamoto, MD      . isosorbide mononitrate (IMDUR) 24 hr tablet 30 mg  30 mg Oral Daily Raylene Miyamoto, MD   30 mg at 05/28/16 1014  . labetalol (NORMODYNE) tablet 300 mg  300 mg Oral TID Raylene Miyamoto, MD   300 mg at 05/28/16 1015  . loratadine (CLARITIN) tablet 10 mg  10 mg Oral Daily Maryann Mikhail, DO   10 mg at 05/27/16 1059  . MEDLINE mouth rinse  15 mL Mouth Rinse q12n4p Maryann Mikhail, DO      . ondansetron (ZOFRAN) tablet 4 mg  4 mg Oral Q6H PRN Lily Kocher, MD       Or  . ondansetron [facility]) injection 4 mg  4 mg Intravenous Q6H PRN Lily Kocher, MD      . sodium chloride flush (NS) 0.9 % injection 3 mL  3  mL Intravenous Q12H Lily Kocher, MD   3 mL at 05/28/16 0803    Musculoskeletal: Strength & Muscle Tone: decreased Gait & Station: unable to stand Patient leans: N/A  Psychiatric Specialty Exam: Physical Exam as per history and physical   ROS patient has morbid obesity, generalized abdominal pain and distention, malignant hypertension, unable to care for herself with medication management. Patient denies nausea, vomiting chest pain. No Fever-chills, No Headache, No changes with Vision or hearing, reports vertigo No problems swallowing food or Liquids, No Chest pain, Cough or Shortness of Breath, No Nausea or Vommitting, Bowel movements are regular, No Blood in stool or Urine, No dysuria, No new skin rashes or bruises, No new joints pains-aches,  No new weakness, tingling, numbness in any extremity, No recent weight gain or loss, No polyuria, polydypsia or polyphagia,   A full 10 point Review of Systems was done, except as stated above, all other Review of Systems were negative.  Blood pressure 140/75, pulse 97, temperature 98.5 F (36.9 C), temperature source Axillary, resp. rate (!) 25, height _0  (1.651 m), weight (!) 210.5 kg (464 lb), SpO2 96 %, unknown if currently breastfeeding.Body mass index is 77.21 kg/m.  General Appearance: Casual  Eye Contact:  Good  Speech:  Clear and Coherent  Volume:  Normal  Mood:  Depressed  Affect:  Constricted and Depressed  Thought Process:  Coherent and Goal Directed  Orientation:  Full (Time, Place, and Person)  Thought Content:  WDL  Suicidal Thoughts:  No  Homicidal Thoughts:  No  Memory:  Immediate;   Good Recent;   Fair Remote;   Fair  Judgement:  Impaired  Insight:  Shallow  Psychomotor Activity:  Decreased  Concentration:  Concentration: Fair and Attention Span: Fair  Recall:  Good  Fund of Knowledge:  Good  Language:  Good  Akathisia:  Negative  Handed:  Right  AIMS (if indicated):     Assets:  Communication  Skills Desire for Improvement Financial Resources/Insurance Housing Leisure Time Resilience Social Support Transportation Vocational/Educational  ADL's:  Impaired  Cognition:  WNL  Sleep:        Treatment Plan Summary: 35 years old female admitted with generalized abdominal pain, bloating and malignant hypertension. Patient has been noncompliant with her medication management. Reportedly patient mother believes patient has been depressed but patient denied symptoms and signs of depression. Patient  reportedly works in Librarian, academic at [employer] feels it assess for if needed services.   Patient has no safety concerns  Daily contact with patient to assess and evaluate symptoms and progress in treatment and Medication management   Offered medication management for depression patient refused to provide consent  Patient will be referred to out patient psychiatric counseling center, may benefit from the family service of [place] and avoid [place] for privacy from the coworkers at work   Disposition: No evidence of imminent risk to self or others at present.   Patient does not meet criteria for psychiatric inpatient admission. Supportive therapy provided about ongoing stressors.  Ambrose Finland, MD 05/28/2016 11:06 AM

## 2016-05-29 ENCOUNTER — Inpatient Hospital Stay (HOSPITAL_COMMUNITY): Payer: PRIVATE HEALTH INSURANCE

## 2016-05-29 LAB — CBC
HCT: 42.6 % (ref 36.0–46.0)
Hemoglobin: 12.2 g/dL (ref 12.0–15.0)
MCH: 23.9 pg — ABNORMAL LOW (ref 26.0–34.0)
MCHC: 28.6 g/dL — ABNORMAL LOW (ref 30.0–36.0)
MCV: 83.4 fL (ref 78.0–100.0)
Platelets: 158 10*3/uL (ref 150–400)
RBC: 5.11 MIL/uL (ref 3.87–5.11)
RDW: 20.7 % — ABNORMAL HIGH (ref 11.5–15.5)
WBC: 3.6 10*3/uL — ABNORMAL LOW (ref 4.0–10.5)

## 2016-05-29 LAB — GLUCOSE, CAPILLARY
Glucose-Capillary: 81 mg/dL (ref 65–99)
Glucose-Capillary: 82 mg/dL (ref 65–99)
Glucose-Capillary: 82 mg/dL (ref 65–99)
Glucose-Capillary: 89 mg/dL (ref 65–99)

## 2016-05-29 LAB — BASIC METABOLIC PANEL
Anion gap: 8 (ref 5–15)
BUN: 25 mg/dL — ABNORMAL HIGH (ref 6–20)
CO2: 41 mmol/L — ABNORMAL HIGH (ref 22–32)
Calcium: 8.7 mg/dL — ABNORMAL LOW (ref 8.9–10.3)
Chloride: 93 mmol/L — ABNORMAL LOW (ref 101–111)
Creatinine, Ser: 0.97 mg/dL (ref 0.44–1.00)
GFR calc Af Amer: >60 mL/min (ref >60)
GFR calc non Af Amer: >60 mL/min (ref >60)
Glucose, Bld: 73 mg/dL (ref 65–99)
Potassium: 4 mmol/L (ref 3.5–5.1)
Sodium: 142 mmol/L (ref 135–145)

## 2016-05-29 MED ORDER — BIOTENE DRY MOUTH MT LIQD
15.0000 mL | Freq: Four times a day (QID) | OROMUCOSAL | Status: DC
  Administered 2016-05-29 – 2016-06-01 (×10): 15 mL via OROMUCOSAL

## 2016-05-29 MED ORDER — CHLORHEXIDINE GLUCONATE 0.12 % MT SOLN
15.0000 mL | Freq: Two times a day (BID) | OROMUCOSAL | Status: DC
  Administered 2016-05-29 – 2016-05-31 (×5): 15 mL via OROMUCOSAL

## 2016-05-29 MED ORDER — LABETALOL HCL 200 MG PO TABS: 300.0000 mg | ORAL_TABLET | Freq: Three times a day (TID) | ORAL | Status: DC

## 2016-05-29 MED ORDER — LABETALOL HCL 200 MG PO TABS
200.0000 mg | ORAL_TABLET | Freq: Three times a day (TID) | ORAL | Status: DC
  Administered 2016-05-29 – 2016-06-04 (×20): 200 mg via ORAL
  Filled 2016-05-29 (×20): qty 1

## 2016-05-29 MED ORDER — LABETALOL HCL 200 MG PO TABS: 200.0000 mg | ORAL_TABLET | Freq: Three times a day (TID) | ORAL | Status: DC

## 2016-05-29 MED ORDER — PROPOFOL 1000 MG/100ML IV EMUL
5.0000 ug/kg/min | INTRAVENOUS | Status: DC
  Administered 2016-05-29 (×5): 40 ug/kg/min via INTRAVENOUS
  Administered 2016-05-30: 30 ug/kg/min via INTRAVENOUS
  Administered 2016-05-30 – 2016-06-01 (×18): 40 ug/kg/min via INTRAVENOUS
  Administered 2016-06-01: 30 ug/kg/min via INTRAVENOUS
  Filled 2016-05-29 (×9): qty 100
  Filled 2016-05-29: qty 200
  Filled 2016-05-29 (×17): qty 100

## 2016-05-29 NOTE — Progress Notes (Signed)
Patient had 1 ring on left hand and 1 ring on right hand. Epimenio Sarin, RN removed both rings and gave them to patient's mom to take home.

## 2016-05-29 NOTE — Progress Notes (Signed)
Initial Nutrition Assessment  DOCUMENTATION CODES:   Morbid obesity  INTERVENTION:  If unable to extubate recommend, Vital High Protein at goal rate of 5 ml/h (120 ml per day) and Prostat 60 ml QID. TF regimen with current propofol rate will provide 2129 kcals, 131 gm protein, 101 ml free water daily.  NUTRITION DIAGNOSIS:   Inadequate oral intake related to inability to eat as evidenced by NPO status.  GOAL:   Provide needs based on ASPEN/SCCM guidelines  MONITOR:   Vent status, Labs, Weight trends, Skin, I & O's  REASON FOR ASSESSMENT:   Consult Diet education  ASSESSMENT:   [name] with PMH of morbid obesity (469 lbs), DM, HTN and tachycardia who presented to [facility] on 10/3 with reports of abdominal bloating and heavy abdominal pressure.    Patient is currently intubated on ventilator support MV: 10.9 L/min Temp (24hrs), Avg:98.2 F (36.8 C), Min:97.7 F (36.5 C), Max:98.6 F (37 C)  Propofol: 45.8 ml/hr which provides 1209 kcal/day.  Spoke with RN, no plans for extubation today. Pt currently diuresing. Labs and medications reviewed.   Diet Order:  Diet NPO time specified  Skin:  Wound (see comment) (wound on L and R tibial)  Last BM:  10/3  Height:   Ht Readings from Last 1 Encounters:  05/28/16 _0  (1.6 m)    Weight:   Wt Readings from Last 1 Encounters:  05/29/16 (!) 421 lb (191 kg)    Ideal Body Weight:  52.27 kg  BMI:  Body mass index is 74.58 kg/m.  Estimated Nutritional Needs:   Kcal:  1150-1307  Protein:  >/= 130 grams  Fluid:  Per MD  EDUCATION NEEDS:   Education needs no appropriate at this time  Corrin Parker, MS, RD, LDN Pager # [phone] After hours/ weekend pager # [phone]

## 2016-05-29 NOTE — Progress Notes (Signed)
Patient Name: [name] [name] Date of Encounter: 05/29/2016  Primary Cardiologist: [facility] Problem List     Principal Problem:   Acute congestive heart failure [facility]) Active Problems:   Malignant hypertension   Morbid obesity (HCC)   Sleep apnea   Renal insufficiency   Acute pulmonary edema (HCC)   Abdominal pain   Hepatic congestion   Sinus tachycardia   Pericardial effusion   Pulmonary hypertension   Acute respiratory failure with hypoxia (HCC)   Subjective   Somnolent on BiPAP, mother in room  Inpatient Medications    Scheduled Meds: . antiseptic oral rinse  15 mL Mouth Rinse QID  . chlorhexidine  15 mL Mouth Rinse BID  . enoxaparin (LOVENOX) injection  100 mg Subcutaneous Q24H  . fluconazole  100 mg Oral Daily  . fluconazole  200 mg Oral Once  . furosemide  80 mg Intravenous Q6H  . hydrALAZINE  50 mg Oral Q8H  . isosorbide mononitrate  30 mg Oral Daily  . labetalol  300 mg Oral TID  . loratadine  10 mg Oral Daily  . sodium chloride flush  3 mL Intravenous Q12H   Continuous Infusions: . propofol (DIPRIVAN) infusion 50 mcg/kg/min (05/29/16 0940)   PRN Meds:.acetaminophen **OR** acetaminophen, hydrALAZINE, ondansetron **OR** ondansetron (ZOFRAN) IV   Vital Signs    Vitals:   05/29/16 0800 05/29/16 0831 05/29/16 0900 05/29/16 1000  BP: (!) 147/92 (!) 147/92 (!) 151/90   Pulse: 71 82 80 76  Resp: (!) 24 (!) 26 (!) 24 (!) 21  Temp: 98.2 F (36.8 C)     TempSrc: Oral     SpO2: 100% 100% 97% 96%  Weight:      Height:        Intake/Output Summary (Last 24 hours) at 05/29/16 1005 Last data filed at 05/29/16 0920  Gross per 24 hour  Intake          1202.08 ml  Output             5015 ml  Net         -3812.92 ml   Filed Weights   05/27/16 0649 05/28/16 0500 05/29/16 0500  Weight: (!) 469 lb (212.7 kg) (!) 464 lb (210.5 kg) (!) 421 lb (191 kg)    Physical Exam    GEN: Laying in bed, in no acute distress.  HEENT: Grossly normal. BIPAP Neck:  Supple, no JVD, carotid bruits, or masses. Cardiac: RRR, no murmurs, rubs, or gallops. No clubbing, cyanosis, edema.  Radials/DP/PT 2+ and equal bilaterally.  Respiratory:  Respirations regular increased effort noted, clear to auscultation bilaterally, decreased BS. GI: Soft, nontender, nondistended, BS + x 4. obese MS: no deformity or atrophy. Skin: warm and dry, no rash. Neuro:  Strength and sensation are intact. Psych: Somnolent  Labs    CBC  Recent Labs  05/27/16 0339 05/29/16 0212  WBC 6.0 3.6*  HGB 13.5 12.2  HCT 46.9* 42.6  MCV 85.7 83.4  PLT 189 308   Basic Metabolic Panel  Recent Labs  05/27/16 1636 05/28/16 0205 05/29/16 0212  NA 139 141 142  K 5.1 4.5 4.0  CL 97* 95* 93*  CO2 35* 36* 41*  GLUCOSE 121* 91 73  BUN 31* 29* 25*  CREATININE 1.35* 1.04* 0.97  CALCIUM 8.5* 8.8* 8.7*  MG 1.9  --   --   PHOS 5.6*  --   --     Recent Labs  05/28/16 2152  TRIG 116  Telemetry    NSR - Personally Reviewed  ECG    NSR, PRWP- Personally Reviewed  Radiology    Dg Chest Port 1 View  Result Date: 05/28/2016 CLINICAL DATA:  Endotracheal tube adjustment.  Initial encounter. EXAM: PORTABLE CHEST 1 VIEW COMPARISON:  Chest radiograph performed earlier today at 9:21 p.m. FINDINGS: The endotracheal tube is seen ending approximately 1 cm above the carina. This could be retracted 2 - 3 cm. The enteric tube is noted extending below the diaphragm. The lungs are mildly hypoexpanded. Vascular congestion and vascular crowding are seen. Increased interstitial markings raise concern for mild pulmonary edema. No pleural effusion or pneumothorax is seen. The cardiomediastinal silhouette is enlarged. No acute osseous abnormalities are identified. IMPRESSION: 1. Endotracheal tube seen ending 1 cm above the carina. This could be retracted 2 - 3 cm. 2. Lungs mildly hypoexpanded. Vascular congestion and cardiomegaly. Increased interstitial markings raise concern for mild pulmonary  edema. Electronically Signed   By: Garald Balding M.D.   On: 05/28/2016 22:44   Dg Chest Port 1 View  Result Date: 05/28/2016 CLINICAL DATA:  Endotracheal tube and orogastric tube placement. EXAM: PORTABLE CHEST 1 VIEW COMPARISON:  Chest radiograph 05/28/2016 at 4:57 a.m. FINDINGS: Endotracheal tube tip is just below the level of the clavicular heads, 1.5 cm above the inferior margin of the carina. Orogastric 2 courses below the diaphragm and beyond the field of view. There is unchanged cardiomegaly with diffuse pulmonary edema. Small left pleural effusion. No pneumothorax visualized. IMPRESSION: 1. Endotracheal tube tip just below the level of the clavicular heads, approximately 1.5 cm above the inferior margin of the carina. 2. Unchanged cardiomegaly and diffuse pulmonary edema. Electronically Signed   By: Ulyses Jarred M.D.   On: 05/28/2016 22:01   Dg Chest Port 1 View  Result Date: 05/28/2016 CLINICAL DATA:  Acute CHF. EXAM: PORTABLE CHEST 1 VIEW COMPARISON:  05/27/2016. FINDINGS: Cardiomegaly with pulmonary vascular prominence and bilateral interstitial prominence noted consistent with CHF. Similar findings noted on prior exam. Small pleural effusions cannot be excluded. No pneumothorax . IMPRESSION: Congestive heart failure with pulmonary interstitial edema. Small bilateral pleural effusions cannot be excluded. No significant interim change from prior exam. Electronically Signed   By: [place]   On: 05/28/2016 07:14   Cardiac Studies   CT scan chest: No PE, but dilated pulmonary artery (58m).  ECHO: - Left ventricle: The cavity size was normal. Systolic function was   normal. The estimated ejection fraction was in the range of 50%   to 55%. Regional wall motion abnormalities cannot be excluded.   The study is not technically sufficient to allow evaluation of LV   diastolic function. - Left atrium: The atrium was mildly dilated. - Right atrium: The atrium was mildly dilated. -  Pulmonary arteries: Systolic pressure was mildly increased. PA   peak pressure: 42 mm Hg (S).  Impressions:  - Extremely limited; definity used; probable low normal LV sstolci   function; mild biatrial enlargement; mild TR; mildly elevated   pulmonary pressure; suggest MUGA or cardiac MRI if clinically   indicated.    Patient Profile     35year old with super morbid obesity, secondary pulmonary hypertension, acute diastolic heart failure, obesity hypoventilation syndrome, hypercarbia.  Assessment & Plan    Acute diastolic HF  -ECHO with normal EF but mild pulm HTN and dilated PA.   - Would continue with lasix emperically and watch for change in creat. Crea 0.8, negative 3.8 L overnight -  we will continue aggressive diuresis  - Difficult to assess fluid status.  Obesity  - Causing pulm htn  - weight loss  - BIPAP  Secondary pulm htn  - as above  - weight loss  - BP control  - small pericardial effusion should be of no clinical concern.   Elevated troponin/ demand ischemia  - low level troponin secondary to acute heart strain/failure.   Essential hypertension  - BP improved, see meds.   - NTG IV  - Hydral  - Lasix.   - would be OK with transition to metoprolol or labetalol IV if unable to administer PO  Hypercarbic respiratory failure, obesity hypoventilation syndrome  - CCM following, weaning not attempted yet  - BIPAP titration  - PCO2 as high as >100, pH 7.16>>>>improved   - in CCU  Discussed with mom, dad and sister. They were concerned about underlying depression.   Signed, Ena Dawley, MD  05/29/2016, 10:05 AM

## 2016-05-29 NOTE — Progress Notes (Signed)
PULMONARY / CRITICAL CARE MEDICINE   Name: [name] [name] MRN: [id number] DOB: 1981/03/01    ADMISSION DATE:  05/25/2016 CONSULTATION DATE:  05/27/16  REFERRING MD:  Dr. Ree Kida   CHIEF COMPLAINT:  Altered Mental Status   BRIEF [name] with PMH of morbid obesity (469 lbs), DM, HTN and tachycardia who presented to [facility] on 10/3 with reports of abdominal bloating and heavy abdominal pressure.    On admit, the patient reported she had not taken her blood pressure medications in over one year.  She was profoundly hypertensive on admit as well, initial BP 215/151.  She was found to be hypoxic with saturations in the 70's which improved with 3L O2.  D-Dimer was elevated at 2.59 and a CTA of the chest showed CHF but no PE.  BNP 594.  The patient was admitted per [facility] and treated for acute decompensated CHF.  She was treated with IV diuresis, blood pressure control, afterload reduction and fluid restriction.  ECHO assessment revealed LVEF 50-55%, difficult to assess LV diastolic function, mildly dilated lA, RA, PA peak 42 mm Hg.  On 10/5 afternoon, the patient was found to be altered and difficult to arouse.  Rapid response team was activated.  ABG assessed > 7.16 & pCO2 > 100.  The patient placed on BiPAP and transferred to ICU.  With stimulation of transfer, the patient woke some and was able to interact with staff.    PCCM called for ICU assessment.       STUDIES:  ECHO 10/4 >> EF 55%, PAP 42.  CULTURES:   ANTIBIOTICS:   SIGNIFICANT EVENTS: 10/3  Admit. 10/5  Transferred to ICU due to AMS, hypercarbic respiratory failure 05/28/16:  RN reports pt remains on bipap, ntg gtt continues & BP remains elevated in 334'D systolic.  Net negative 5.5 L in last 24 hours with improvement in sr cr    SUBJECTIVE/OVERNIGHT/INTERVAL HX 05/29/16 - intubated overnight . Not on presssors. Being diursed. Per RN - cards told her that patient is 7# down in fluids overnight. SBP @ 140s. On labetalol - vagaled with  suction - hr drpped to 50s x 2 despite being off diprivan. She is on labetalol and imdur - rn uanble to crush imdur  VITAL SIGNS: BP (!) 146/85   Pulse 76   Temp 98.2 F (36.8 C) (Oral)   Resp (!) 21   Ht _0  (1.6 m)   Wt (!) 191 kg (421 lb) Comment: per baribed   SpO2 96%   BMI 74.58 kg/m   HEMODYNAMICS:    VENTILATOR SETTINGS: Vent Mode: PRVC FiO2 (%):  [40 %] 40 % Set Rate:  [20 bmp-24 bmp] 24 bmp Vt Set:  [420 mL] 420 mL PEEP:  [5 cmH20] 5 cmH20 Plateau Pressure:  [18 cmH20-34 cmH20] 34 cmH20  INTAKE / OUTPUT: I/O last 3 completed shifts: In: 1984 [P.O.:480; I.V.:1504] Out: 9540 [Urine:9540]  PHYSICAL EXAMINATION: General:  Morbidly obese female on BiPAP  Neuro:  Awake, alert, MAE  HEENT:  MM pink/moist, unable to assess JVD  Cardiovascular:  s1s2 rrr, tachy, distant tones  Lungs:  Shallow, non-labored, lungs bilaterally diminished but clear  Abdomen:  Obese, soft, bsx4 active  Musculoskeletal:  No acute deformities  Skin:  Warm/dry, generalized edema  LABS: PULMONARY  Recent Labs Lab 05/28/16 0005 05/28/16 0835 05/28/16 1735 05/28/16 1940 05/28/16 2345  PHART 7.286* 7.303* 7.264* 7.260* 7.373  PCO2ART 84.7* 89.8* 102* 105* 76.6*  PO2ART 126* 110.0* 89.4 84.1 78.3*  HCO3  39.1* 43.2* 44.5* 45.8* 43.7*  O2SAT 98.4 97.6 95.3 95.1 95.2    CBC  Recent Labs Lab 05/25/16 1240 05/27/16 0339 05/29/16 0212  HGB 14.3 13.5 12.2  HCT 47.9* 46.9* 42.6  WBC 9.8 6.0 3.6*  PLT 191 189 158    COAGULATION  Recent Labs Lab 05/26/16 0435  INR 1.28    CARDIAC   Recent Labs Lab 05/25/16 2305 05/26/16 0435 05/26/16 0946  TROPONINI 0.06* 0.10* 0.06*   No results for input(s): PROBNP in the last 168 hours.   CHEMISTRY  Recent Labs Lab 05/26/16 0435 05/27/16 0339 05/27/16 1636 05/28/16 0205 05/29/16 0212  NA 140 139 139 141 142  K 5.2* 4.4 5.1 4.5 4.0  CL 98* 95* 97* 95* 93*  CO2 34* 35* 35* 36* 41*  GLUCOSE 127* 117* 121* 91 73  BUN  27* 31* 31* 29* 25*  CREATININE 1.41* 1.29* 1.35* 1.04* 0.97  CALCIUM 8.6* 8.6* 8.5* 8.8* 8.7*  MG  --   --  1.9  --   --   PHOS  --   --  5.6*  --   --    Estimated Creatinine Clearance: 136.4 mL/min (by C-G formula based on SCr of 0.97 mg/dL).   LIVER  Recent Labs Lab 05/25/16 1240 05/26/16 0435  AST 34  --   ALT 24  --   ALKPHOS 102  --   BILITOT 2.3*  --   PROT 7.5  --   ALBUMIN 2.8*  --   INR  --  1.28     INFECTIOUS  Recent Labs Lab 05/27/16 1636  LATICACIDVEN 1.7     ENDOCRINE CBG (last 3)  No results for input(s): GLUCAP in the last 72 hours.       IMAGING x48h  - image(s) personally visualized  -   highlighted in bold Dg Chest Port 1 View  Result Date: 05/28/2016 CLINICAL DATA:  Endotracheal tube adjustment.  Initial encounter. EXAM: PORTABLE CHEST 1 VIEW COMPARISON:  Chest radiograph performed earlier today at 9:21 p.m. FINDINGS: The endotracheal tube is seen ending approximately 1 cm above the carina. This could be retracted 2 - 3 cm. The enteric tube is noted extending below the diaphragm. The lungs are mildly hypoexpanded. Vascular congestion and vascular crowding are seen. Increased interstitial markings raise concern for mild pulmonary edema. No pleural effusion or pneumothorax is seen. The cardiomediastinal silhouette is enlarged. No acute osseous abnormalities are identified. IMPRESSION: 1. Endotracheal tube seen ending 1 cm above the carina. This could be retracted 2 - 3 cm. 2. Lungs mildly hypoexpanded. Vascular congestion and cardiomegaly. Increased interstitial markings raise concern for mild pulmonary edema. Electronically Signed   By: Garald Balding M.D.   On: 05/28/2016 22:44   Dg Chest Port 1 View  Result Date: 05/28/2016 CLINICAL DATA:  Endotracheal tube and orogastric tube placement. EXAM: PORTABLE CHEST 1 VIEW COMPARISON:  Chest radiograph 05/28/2016 at 4:57 a.m. FINDINGS: Endotracheal tube tip is just below the level of the clavicular  heads, 1.5 cm above the inferior margin of the carina. Orogastric 2 courses below the diaphragm and beyond the field of view. There is unchanged cardiomegaly with diffuse pulmonary edema. Small left pleural effusion. No pneumothorax visualized. IMPRESSION: 1. Endotracheal tube tip just below the level of the clavicular heads, approximately 1.5 cm above the inferior margin of the carina. 2. Unchanged cardiomegaly and diffuse pulmonary edema. Electronically Signed   By: Ulyses Jarred M.D.   On: 05/28/2016 22:01   Dg Chest  Port 1 View  Result Date: 05/28/2016 CLINICAL DATA:  Acute CHF. EXAM: PORTABLE CHEST 1 VIEW COMPARISON:  05/27/2016. FINDINGS: Cardiomegaly with pulmonary vascular prominence and bilateral interstitial prominence noted consistent with CHF. Similar findings noted on prior exam. Small pleural effusions cannot be excluded. No pneumothorax . IMPRESSION: Congestive heart failure with pulmonary interstitial edema. Small bilateral pleural effusions cannot be excluded. No significant interim change from prior exam. Electronically Signed   By: [place]   On: 05/28/2016 07:14   Dg Chest Port 1 View  Result Date: 05/27/2016 CLINICAL DATA:  Loss of consciousness. EXAM: PORTABLE CHEST 1 VIEW COMPARISON:  05/25/2016 FINDINGS: Again noted is marked cardiomegaly. Diffuse pulmonary infiltrates are again identified, consistent with pulmonary edema and/or infectious process. IMPRESSION: Stable appearance of cardiomegaly and pulmonary infiltrates. Electronically Signed   By: Nolon Nations M.D.   On: 05/27/2016 14:52   Dg Abd Portable 1v  Result Date: 05/28/2016 CLINICAL DATA:  Orogastric tube placement.  Initial encounter. EXAM: PORTABLE ABDOMEN - 1 VIEW COMPARISON:  None. FINDINGS: The patient's orogastric tube is noted ending about the body of the stomach. The bowel gas pattern is difficult to assess given motion artifact. No acute osseous abnormalities are seen. Mild left basilar airspace  opacity is seen. IMPRESSION: Orogastric tube noted ending about the body of the stomach. Electronically Signed   By: Garald Balding M.D.   On: 05/28/2016 22:46       DISCUSSION: [name] with PMH of super morbid obesity, HTN, pulmonary hypertension admitted with decompensated CHF.  Decompensated 10/5 with hypercarbic respiratory failure.      PULMONARY A: Acute on Chronic Hypoxemic and Hypercarbic Respiratory Failure. Secondary pulmonary hypertension. Severe OSA / OHS with acute Decompensation - sleep study in 2012 with up to 75 events per hour with desaturation to 72%  - intubatd 05/28/16   P:   Full vent support Continue aggressive diuresis as BP and SCr permit. Pulmonary hygiene. O2 as needed to support sats > 92% Will need ambulatory saturation assessment prior to discharge CXR intermittently.  CARDIOVASCULAR A:  Hypertensive Urgency. Acute diastolic heart failure. Elevated troponin - due to demand ischemia. Hx HTN.    - off nitro gtt. On PO imdur and po labetalol. Having vagal bradycardia transient during suction  (following addition of imdura and increasing labetalol 05/28/16)  P:  Hydralazine 50 mg QD  labetalol decrease to 200 mg TID DC Imdur 30 mg QD PRN Hydralazine for SBP > 150 (with holding parameters for SBP < 110). Goal SBP < 150 per RN Cardiology following, appreciate the assistance. Continue aggressive diuresis as BP and SCr permit.  RENAL A:   AKI - improving P:   KVO fluids. Correct electrolytes as indicated. Trend BMP / UOP  GASTROINTESTINAL A:   Morbid obesity. Nutrition. P:   NPO. Dietician consult for calorie counts / diet education. Pt will need outpatient referral to Dr. Hassell Done for consideration of bypass surgery  HEMATOLOGIC A:   VTE prophylaxis. P:  SCD's / Lovenox for DVT prophylaxis CBC in AM.  INFECTIOUS A:   No indication of infection. P:   Monitor clinically.  ENDOCRINE A:   No acute issues. P:   No  interventions required.  NEUROLOGIC A:   Acute encephalopathy - in the setting of hypercarbia. Depression - family reports pt is in "denial" - seen by Psych 05/28/16  P:   Monitor clinically. Limit sedating meds. Follow ABG intermittently   REPRODUCTIVE A: R/o pregnancy - had "abd bloating"  x 1 week upon presentation 10/3, negative P: No acute interventions  FAMILY  - Updates: Updated at bedside.  05/29/16 - step-dad, mom and sister  - Inter-disciplinary family meet or Palliative Care meeting due by: 10/12     The patient is critically ill with multiple organ systems failure and requires high complexity decision making for assessment and support, frequent evaluation and titration of therapies, application of advanced monitoring technologies and extensive interpretation of multiple databases.   Critical Care Time devoted to patient care services described in this note is  30  Minutes. This time reflects time of care of this signee Dr Brand Males. This critical care time does not reflect procedure time, or teaching time or supervisory time of PA/NP/Med student/Med Resident etc but could involve care discussion time    Dr. Brand Males, M.D., [facility].C.P Pulmonary and Critical Care Medicine Staff Physician [facility] Pager: [phone], If no answer or between  15:00h - 7:00h: call [phone]  05/29/2016 10:31 AM

## 2016-05-30 ENCOUNTER — Inpatient Hospital Stay (HOSPITAL_COMMUNITY): Payer: PRIVATE HEALTH INSURANCE

## 2016-05-30 LAB — BASIC METABOLIC PANEL
Anion gap: 10 (ref 5–15)
BUN: 17 mg/dL (ref 6–20)
CO2: 45 mmol/L — ABNORMAL HIGH (ref 22–32)
Calcium: 8.3 mg/dL — ABNORMAL LOW (ref 8.9–10.3)
Chloride: 88 mmol/L — ABNORMAL LOW (ref 101–111)
Creatinine, Ser: 0.99 mg/dL (ref 0.44–1.00)
GFR calc Af Amer: >60 mL/min (ref >60)
GFR calc non Af Amer: >60 mL/min (ref >60)
Glucose, Bld: 79 mg/dL (ref 65–99)
Potassium: 3 mmol/L — ABNORMAL LOW (ref 3.5–5.1)
Sodium: 143 mmol/L (ref 135–145)

## 2016-05-30 LAB — GLUCOSE, CAPILLARY
Glucose-Capillary: 90 mg/dL (ref 65–99)
Glucose-Capillary: 83 mg/dL (ref 65–99)
Glucose-Capillary: 95 mg/dL (ref 65–99)
Glucose-Capillary: 84 mg/dL (ref 65–99)
Glucose-Capillary: 88 mg/dL (ref 65–99)

## 2016-05-30 MED ORDER — ISOSORBIDE MONONITRATE ER 30 MG PO TB24
30.0000 mg | ORAL_TABLET | Freq: Every day | ORAL | Status: DC
  Filled 2016-05-30: qty 1

## 2016-05-30 MED ORDER — ORAL CARE MOUTH RINSE
15.0000 mL | OROMUCOSAL | Status: DC
  Administered 2016-05-30 – 2016-06-12 (×129): 15 mL via OROMUCOSAL

## 2016-05-30 MED ORDER — POTASSIUM CHLORIDE 10 MEQ/100ML IV SOLN
10.0000 meq | INTRAVENOUS | Status: AC
  Administered 2016-05-30 – 2016-05-31 (×6): 10 meq via INTRAVENOUS
  Filled 2016-05-30 (×6): qty 100

## 2016-05-30 MED ORDER — FAMOTIDINE IN NACL 20-0.9 MG/50ML-% IV SOLN
20.0000 mg | Freq: Two times a day (BID) | INTRAVENOUS | Status: DC
  Administered 2016-05-30 – 2016-06-17 (×38): 20 mg via INTRAVENOUS
  Filled 2016-05-30 (×39): qty 50

## 2016-05-30 MED ORDER — ISOSORBIDE DINITRATE 10 MG PO TABS
15.0000 mg | ORAL_TABLET | Freq: Two times a day (BID) | ORAL | Status: DC
  Administered 2016-05-30 – 2016-06-03 (×10): 15 mg via ORAL
  Filled 2016-05-30 (×10): qty 2

## 2016-05-30 MED ORDER — CHLORHEXIDINE GLUCONATE 0.12% ORAL RINSE (MEDLINE KIT)
15.0000 mL | Freq: Two times a day (BID) | OROMUCOSAL | Status: DC
  Administered 2016-05-31 – 2016-06-28 (×46): 15 mL via OROMUCOSAL

## 2016-05-30 NOTE — Progress Notes (Signed)
Patient Name: [name] [name] Date of Encounter: 05/30/2016  Primary Cardiologist: [facility] Problem List     Principal Problem:   Acute congestive heart failure [facility]) Active Problems:   Malignant hypertension   Morbid obesity (HCC)   Sleep apnea   Renal insufficiency   Acute pulmonary edema (HCC)   Abdominal pain   Hepatic congestion   Sinus tachycardia   Pericardial effusion   Pulmonary hypertension   Acute respiratory failure with hypoxia (HCC)   Subjective   On vent, sedated, intubated, weaned off sedation this am, following commands.  Inpatient Medications    Scheduled Meds: . antiseptic oral rinse  15 mL Mouth Rinse QID  . chlorhexidine  15 mL Mouth Rinse BID  . enoxaparin (LOVENOX) injection  100 mg Subcutaneous Q24H  . fluconazole  100 mg Oral Daily  . fluconazole  200 mg Oral Once  . furosemide  80 mg Intravenous Q6H  . hydrALAZINE  50 mg Oral Q8H  . labetalol  200 mg Oral TID  . sodium chloride flush  3 mL Intravenous Q12H   Continuous Infusions: . propofol (DIPRIVAN) infusion 30 mcg/kg/min (05/30/16 0856)   PRN Meds:.hydrALAZINE, ondansetron **OR** ondansetron (ZOFRAN) IV   Vital Signs    Vitals:   05/30/16 0547 05/30/16 0700 05/30/16 0800 05/30/16 0802  BP: (!) 147/88 (!) 146/81 (!) 146/80 (!) 146/80  Pulse:  91 94 94  Resp:  (!) 24 (!) 24 (!) 24  Temp:  99.4 F (37.4 C)    TempSrc:  Oral    SpO2:  94% 92% 93%  Weight:      Height:        Intake/Output Summary (Last 24 hours) at 05/30/16 0936 Last data filed at 05/30/16 0908  Gross per 24 hour  Intake          1069.85 ml  Output             4170 ml  Net         -3100.15 ml   Filed Weights   05/28/16 0500 05/29/16 0500 05/30/16 0333  Weight: (!) 464 lb (210.5 kg) (!) 421 lb (191 kg) (!) 411 lb (186.4 kg)    Physical Exam    GEN: Laying in bed, in no acute distress.  HEENT: Grossly normal. BIPAP Neck: Supple, no JVD, carotid bruits, or masses. Cardiac: RRR, no murmurs,  rubs, or gallops. No clubbing, cyanosis, edema.  Radials/DP/PT 2+ and equal bilaterally.  Respiratory:  Respirations regular increased effort noted, clear to auscultation bilaterally, decreased BS. GI: Soft, nontender, nondistended, BS + x 4. obese MS: no deformity or atrophy. Skin: warm and dry, no rash. Neuro:  Strength and sensation are intact. Psych: Somnolent  Labs    CBC  Recent Labs  05/29/16 0212  WBC 3.6*  HGB 12.2  HCT 42.6  MCV 83.4  PLT 573   Basic Metabolic Panel  Recent Labs  05/27/16 1636 05/28/16 0205 05/29/16 0212  NA 139 141 142  K 5.1 4.5 4.0  CL 97* 95* 93*  CO2 35* 36* 41*  GLUCOSE 121* 91 73  BUN 31* 29* 25*  CREATININE 1.35* 1.04* 0.97  CALCIUM 8.5* 8.8* 8.7*  MG 1.9  --   --   PHOS 5.6*  --   --     Recent Labs  05/28/16 2152  TRIG 116   Telemetry    NSR - Personally Reviewed  ECG    NSR, PRWP- Personally Reviewed  Radiology  Dg Chest Port 1 View  Result Date: 05/28/2016 CLINICAL DATA:  Endotracheal tube adjustment.  Initial encounter. EXAM: PORTABLE CHEST 1 VIEW COMPARISON:  Chest radiograph performed earlier today at 9:21 p.m. FINDINGS: The endotracheal tube is seen ending approximately 1 cm above the carina. This could be retracted 2 - 3 cm. The enteric tube is noted extending below the diaphragm. The lungs are mildly hypoexpanded. Vascular congestion and vascular crowding are seen. Increased interstitial markings raise concern for mild pulmonary edema. No pleural effusion or pneumothorax is seen. The cardiomediastinal silhouette is enlarged. No acute osseous abnormalities are identified. IMPRESSION: 1. Endotracheal tube seen ending 1 cm above the carina. This could be retracted 2 - 3 cm. 2. Lungs mildly hypoexpanded. Vascular congestion and cardiomegaly. Increased interstitial markings raise concern for mild pulmonary edema. Electronically Signed   By: Garald Balding M.D.   On: 05/28/2016 22:44   Dg Chest Port 1 View  Result  Date: 05/28/2016 CLINICAL DATA:  Endotracheal tube and orogastric tube placement. EXAM: PORTABLE CHEST 1 VIEW COMPARISON:  Chest radiograph 05/28/2016 at 4:57 a.m. FINDINGS: Endotracheal tube tip is just below the level of the clavicular heads, 1.5 cm above the inferior margin of the carina. Orogastric 2 courses below the diaphragm and beyond the field of view. There is unchanged cardiomegaly with diffuse pulmonary edema. Small left pleural effusion. No pneumothorax visualized. IMPRESSION: 1. Endotracheal tube tip just below the level of the clavicular heads, approximately 1.5 cm above the inferior margin of the carina. 2. Unchanged cardiomegaly and diffuse pulmonary edema. Electronically Signed   By: Ulyses Jarred M.D.   On: 05/28/2016 22:01   Dg Chest Port 1 View  Result Date: 05/28/2016 CLINICAL DATA:  Acute CHF. EXAM: PORTABLE CHEST 1 VIEW COMPARISON:  05/27/2016. FINDINGS: Cardiomegaly with pulmonary vascular prominence and bilateral interstitial prominence noted consistent with CHF. Similar findings noted on prior exam. Small pleural effusions cannot be excluded. No pneumothorax . IMPRESSION: Congestive heart failure with pulmonary interstitial edema. Small bilateral pleural effusions cannot be excluded. No significant interim change from prior exam. Electronically Signed   By: [place]   On: 05/28/2016 07:14   Cardiac Studies   CT scan chest: No PE, but dilated pulmonary artery (57m).  ECHO: - Left ventricle: The cavity size was normal. Systolic function was   normal. The estimated ejection fraction was in the range of 50%   to 55%. Regional wall motion abnormalities cannot be excluded.   The study is not technically sufficient to allow evaluation of LV   diastolic function. - Left atrium: The atrium was mildly dilated. - Right atrium: The atrium was mildly dilated. - Pulmonary arteries: Systolic pressure was mildly increased. PA   peak pressure: 42 mm Hg  (S).  Impressions:  - Extremely limited; definity used; probable low normal LV sstolci   function; mild biatrial enlargement; mild TR; mildly elevated   pulmonary pressure; suggest MUGA or cardiac MRI if clinically   indicated.    Patient Profile     35year old with super morbid obesity, secondary pulmonary hypertension, acute diastolic heart failure, obesity hypoventilation syndrome, hypercarbia.  Assessment & Plan    Acute diastolic HF  -ECHO with low normal EF but mild pulm HTN and dilated PA.   - Would continue with lasix emperically and watch for change in creat. Crea 0.97, negative 3.1 L overnight, negative 9 lbs, now 411, she is vent dependent, we will continue an aggressive diuresis, Crea normal  Obesity  - Causing  pulm htn  - weight loss  - BIPAP  Secondary pulm htn  - as above  - weight loss  - BP control  - small pericardial effusion should be of no clinical concern.   Elevated troponin/ demand ischemia  - low level troponin secondary to acute heart strain/failure.   Essential hypertension  - BP improved, see meds.   - add imdur 30 mg po daily  - Hydralazine  - Lasix.    Hypercarbic respiratory failure, obesity hypoventilation syndrome  - CCM following, weaning not attempted yet  - BIPAP titration  - PCO2 as high as >100, pH 7.16>>>>improved   - in CCU  Discussed with mom.  Signed, Ena Dawley, MD  05/30/2016, 9:36 AM

## 2016-05-30 NOTE — Progress Notes (Signed)
PULMONARY / CRITICAL CARE MEDICINE   Name: [name] [name] MRN: [id number] DOB: May 05, 1981    ADMISSION DATE:  05/25/2016 CONSULTATION DATE:  05/27/16  REFERRING MD:  Dr. Ree Kida   CHIEF COMPLAINT:  Altered Mental Status   BRIEF [name] with PMH of morbid obesity (469 lbs), DM, HTN and tachycardia who presented to [facility] on 10/3 with reports of abdominal bloating and heavy abdominal pressure.    On admit, the patient reported she had not taken her blood pressure medications in over one year.  She was profoundly hypertensive on admit as well, initial BP 215/151.  She was found to be hypoxic with saturations in the 70's which improved with 3L O2.  D-Dimer was elevated at 2.59 and a CTA of the chest showed CHF but no PE.  BNP 594.  The patient was admitted per [facility] and treated for acute decompensated CHF.  She was treated with IV diuresis, blood pressure control, afterload reduction and fluid restriction.  ECHO assessment revealed LVEF 50-55%, difficult to assess LV diastolic function, mildly dilated lA, RA, PA peak 42 mm Hg.  On 10/5 afternoon, the patient was found to be altered and difficult to arouse.  Rapid response team was activated.  ABG assessed > 7.16 & pCO2 > 100.  The patient placed on BiPAP and transferred to ICU.  With stimulation of transfer, the patient woke some and was able to interact with staff.    PCCM called for ICU assessment.       STUDIES:  ECHO 10/4 >> EF 55%, PAP 42.  CULTURES:   ANTIBIOTICS:   SIGNIFICANT EVENTS: 10/3  Admit. 10/5  Transferred to ICU due to AMS, hypercarbic respiratory failure 05/28/16:  RN reports pt remains on bipap, ntg gtt continues & BP remains elevated in 166'A systolic.  Net negative 5.5 L in last 24 hours with improvement in sr cr  05/29/16 - intubated overnight . Not on presssors. Being diursed. Per RN - cards told her that patient is 7# down in fluids overnight. SBP @ 140s. On labetalol - vagaled with suction - hr drpped to 50s x 2  despite being off diprivan. She is on labetalol and imdur - rn uanble to crush imdur   SUBJECTIVE/OVERNIGHT/INTERVAL HX 10/8 - 13L negative since admit 05/25/16. Weight from 211kg  -> 186 kg. Possible low grade fever developing. Improved pc02. Aggressive lasix cntinues. Follows commands off sedation diprivan gtt. No more vagal episodes after labetalol decrease. Still needing 60% fio2  VITAL SIGNS: BP (!) 126/59   Pulse 94   Temp 99.4 F (37.4 C) (Oral)   Resp (!) 24   Ht _0  (1.6 m)   Wt (!) 186.4 kg (411 lb)   LMP  (LMP Unknown)   SpO2 92%   BMI 72.81 kg/m   HEMODYNAMICS:    VENTILATOR SETTINGS: Vent Mode: PRVC FiO2 (%):  [40 %-60 %] 60 % Set Rate:  [24 bmp] 24 bmp Vt Set:  [420 mL] 420 mL PEEP:  [5 cmH20] 5 cmH20 Plateau Pressure:  [28 cmH20-34 cmH20] 30 cmH20  INTAKE / OUTPUT: I/O last 3 completed shifts: In: 1943.5 [I.V.:1943.5] Out: 6535 [YTKZS:0109]  PHYSICAL EXAMINATION: General:  Morbidly obese female on BiPAP  Neuro:  Awake, alert, MAE  HEENT:  MM pink/moist, unable to assess JVD  Cardiovascular:  s1s2 rrr, tachy, distant tones  Lungs:  Shallow, non-labored, lungs bilaterally diminished but clear  Abdomen:  Obese, soft, bsx4 active  Musculoskeletal:  No acute deformities  Skin:  Warm/dry, generalized edema  LABS: PULMONARY  Recent Labs Lab 05/28/16 0005 05/28/16 0835 05/28/16 1735 05/28/16 1940 05/28/16 2345  PHART 7.286* 7.303* 7.264* 7.260* 7.373  PCO2ART 84.7* 89.8* 102* 105* 76.6*  PO2ART 126* 110.0* 89.4 84.1 78.3*  HCO3 39.1* 43.2* 44.5* 45.8* 43.7*  O2SAT 98.4 97.6 95.3 95.1 95.2    CBC  Recent Labs Lab 05/25/16 1240 05/27/16 0339 05/29/16 0212  HGB 14.3 13.5 12.2  HCT 47.9* 46.9* 42.6  WBC 9.8 6.0 3.6*  PLT 191 189 158    COAGULATION  Recent Labs Lab 05/26/16 0435  INR 1.28    CARDIAC    Recent Labs Lab 05/25/16 2305 05/26/16 0435 05/26/16 0946  TROPONINI 0.06* 0.10* 0.06*   No results for input(s): PROBNP  in the last 168 hours.   CHEMISTRY  Recent Labs Lab 05/26/16 0435 05/27/16 0339 05/27/16 1636 05/28/16 0205 05/29/16 0212  NA 140 139 139 141 142  K 5.2* 4.4 5.1 4.5 4.0  CL 98* 95* 97* 95* 93*  CO2 34* 35* 35* 36* 41*  GLUCOSE 127* 117* 121* 91 73  BUN 27* 31* 31* 29* 25*  CREATININE 1.41* 1.29* 1.35* 1.04* 0.97  CALCIUM 8.6* 8.6* 8.5* 8.8* 8.7*  MG  --   --  1.9  --   --   PHOS  --   --  5.6*  --   --    Estimated Creatinine Clearance: 134.2 mL/min (by C-G formula based on SCr of 0.97 mg/dL).   LIVER  Recent Labs Lab 05/25/16 1240 05/26/16 0435  AST 34  --   ALT 24  --   ALKPHOS 102  --   BILITOT 2.3*  --   PROT 7.5  --   ALBUMIN 2.8*  --   INR  --  1.28     INFECTIOUS  Recent Labs Lab 05/27/16 1636  LATICACIDVEN 1.7     ENDOCRINE CBG (last 3)   Recent Labs  05/30/16 0331 05/30/16 0743 05/30/16 1134  GLUCAP 90 83 95         IMAGING x48h  - image(s) personally visualized  -   highlighted in bold Dg Chest Port 1 View  Result Date: 05/30/2016 CLINICAL DATA:  Shortness of Breath EXAM: PORTABLE CHEST 1 VIEW COMPARISON:  May 29, 2016 FINDINGS: Endotracheal tube tip is 3.0 cm above the carina. Nasogastric tube tip and side port below the diaphragm. No pneumothorax. There is cardiomegaly with pulmonary venous hypertension. There are bilateral pleural effusions with patchy interstitial alveolar edema bilaterally, primarily in the bases. There are air bronchograms in both lower lobes. No adenopathy evident. IMPRESSION: Tube positions as described without pneumothorax. Evidence congestive heart failure. Question superimposed pneumonia in the bases. The appearance is stable compared to 1 day prior. Electronically Signed   By: Lowella Grip III M.D.   On: 05/30/2016 08:21   Dg Chest Port 1 View  Result Date: 05/29/2016 CLINICAL DATA:  Acute respiratory failure with hypoxia. On ventilator. EXAM: PORTABLE CHEST 1 VIEW COMPARISON:  05/28/2016  FINDINGS: Endotracheal tube and nasogastric tube remain in appropriate position. Worsening diffuse bilateral pulmonary airspace disease is seen since previous study. Heart size remains stable. No pneumothorax visualized. IMPRESSION: Interval worsening of diffuse bilateral pulmonary airspace disease. Electronically Signed   By: Earle Gell M.D.   On: 05/29/2016 11:16   Dg Chest Port 1 View  Result Date: 05/28/2016 CLINICAL DATA:  Endotracheal tube adjustment.  Initial encounter. EXAM: PORTABLE CHEST 1 VIEW COMPARISON:  Chest radiograph performed earlier  today at 9:21 p.m. FINDINGS: The endotracheal tube is seen ending approximately 1 cm above the carina. This could be retracted 2 - 3 cm. The enteric tube is noted extending below the diaphragm. The lungs are mildly hypoexpanded. Vascular congestion and vascular crowding are seen. Increased interstitial markings raise concern for mild pulmonary edema. No pleural effusion or pneumothorax is seen. The cardiomediastinal silhouette is enlarged. No acute osseous abnormalities are identified. IMPRESSION: 1. Endotracheal tube seen ending 1 cm above the carina. This could be retracted 2 - 3 cm. 2. Lungs mildly hypoexpanded. Vascular congestion and cardiomegaly. Increased interstitial markings raise concern for mild pulmonary edema. Electronically Signed   By: Garald Balding M.D.   On: 05/28/2016 22:44   Dg Chest Port 1 View  Result Date: 05/28/2016 CLINICAL DATA:  Endotracheal tube and orogastric tube placement. EXAM: PORTABLE CHEST 1 VIEW COMPARISON:  Chest radiograph 05/28/2016 at 4:57 a.m. FINDINGS: Endotracheal tube tip is just below the level of the clavicular heads, 1.5 cm above the inferior margin of the carina. Orogastric 2 courses below the diaphragm and beyond the field of view. There is unchanged cardiomegaly with diffuse pulmonary edema. Small left pleural effusion. No pneumothorax visualized. IMPRESSION: 1. Endotracheal tube tip just below the level of the  clavicular heads, approximately 1.5 cm above the inferior margin of the carina. 2. Unchanged cardiomegaly and diffuse pulmonary edema. Electronically Signed   By: Ulyses Jarred M.D.   On: 05/28/2016 22:01   Dg Abd Portable 1v  Result Date: 05/28/2016 CLINICAL DATA:  Orogastric tube placement.  Initial encounter. EXAM: PORTABLE ABDOMEN - 1 VIEW COMPARISON:  None. FINDINGS: The patient's orogastric tube is noted ending about the body of the stomach. The bowel gas pattern is difficult to assess given motion artifact. No acute osseous abnormalities are seen. Mild left basilar airspace opacity is seen. IMPRESSION: Orogastric tube noted ending about the body of the stomach. Electronically Signed   By: Garald Balding M.D.   On: 05/28/2016 22:46       DISCUSSION: [name] with PMH of super morbid obesity, HTN, pulmonary hypertension admitted with decompensated CHF.  Decompensated 10/5 with hypercarbic respiratory failure.      PULMONARY A: Acute on Chronic Hypoxemic and Hypercarbic Respiratory Failure. Secondary pulmonary hypertension. Severe OSA / OHS with acute Decompensation - sleep study in 2012 with up to 75 events per hour with desaturation to 72%   - intubated 05/28/16. On 60% fio2 -  05/30/16   P:   Full vent support Continue aggressive diuresis as BP and SCr permit. Pulmonary hygiene. O2 as needed to support sats > 92% Will need ambulatory saturation assessment prior to discharge CXR intermittently.  CARDIOVASCULAR A:  Hypertensive Urgency. Acute diastolic heart failure. Elevated troponin - due to demand ischemia. Hx HTN.    -05/29/16  -  off nitro gtt. On PO imdur and po labetalol. Having vagal bradycardia transient during suction  (following addition of imdura and increasing labetalol 05/28/16)  - 05/30/16 - doing ok  P:  Hydralazine 50 mg QD  labetalol 200 mg TID DC Imdur 30 mg QD PRN Hydralazine for SBP > 150 (with holding parameters for SBP < 110). Goal SBP < 150 per  RN Cardiology following, appreciate the assistance. Continue aggressive diuresis as BP and SCr permit.  RENAL A:   AKI - improving P:   KVO fluids. Correct electrolytes as indicated. Trend BMP / UOP  GASTROINTESTINAL A:   Morbid obesity. Nutrition. P:   NPO. Dietician consult  for calorie counts / diet education. Pt will need outpatient referral to Dr. Hassell Done for consideration of bypass surgery  HEMATOLOGIC A:   VTE prophylaxis. P:  SCD's / Lovenox for DVT prophylaxis CBC in AM.  INFECTIOUS A:   No indication of infection. P:   Monitor clinically.  ENDOCRINE A:   No acute issues. P:   No interventions required.  NEUROLOGIC A:   Acute encephalopathy - in the setting of hypercarbia. Depression - family reports pt is in "denial" - seen by Psych 05/28/16  P:   Monitor clinically. Limit sedating meds. Follow ABG intermittently   REPRODUCTIVE A: R/o pregnancy - had "abd bloating" x 1 week upon presentation 10/3, negative P: No acute interventions  FAMILY  - Updates: Updated at bedside.  05/29/16 - step-dad, mom and sister. 05/30/16 - mom updated  - Inter-disciplinary family meet or Palliative Care meeting due by: 10/12     The patient is critically ill with multiple organ systems failure and requires high complexity decision making for assessment and support, frequent evaluation and titration of therapies, application of advanced monitoring technologies and extensive interpretation of multiple databases.   Critical Care Time devoted to patient care services described in this note is  30  Minutes. This time reflects time of care of this signee Dr Brand Males. This critical care time does not reflect procedure time, or teaching time or supervisory time of PA/NP/Med student/Med Resident etc but could involve care discussion time    Dr. Brand Males, M.D., [facility].C.P Pulmonary and Critical Care Medicine Staff Physician [place]  Pulmonary and Critical Care Pager: [phone], If no answer or between  15:00h - 7:00h: call [phone]  05/30/2016 11:51 AM

## 2016-05-30 NOTE — Progress Notes (Signed)
[facility] Progress Note Patient Name: [name] [name] DOB: [birth year] MRN: [id number]   Date of Service  05/30/2016  HPI/Events of Note  Notified of need for stress ulcer prophylaxis.   eICU Interventions  Will order: 1. Pepcid 20 mg IV now and Q 12 hours.      Intervention Category Intermediate Interventions: Best-practice therapies (e.g. DVT, beta blocker, etc.)  [name] 05/30/2016, 3:37 PM

## 2016-05-30 NOTE — Progress Notes (Signed)
[facility] Progress Note Patient Name: [name] [name] DOB: 06/28/1981 MRN: [id number]   Date of Service  05/30/2016  HPI/Events of Note  K+ = 3.0 and Creatinine = 0.99.  eICU Interventions  Will replace K+.     Intervention Category Major Interventions: Electrolyte abnormality - evaluation and management  Jaleeya Mcnelly [place] 05/30/2016, 6:07 PM

## 2016-05-31 ENCOUNTER — Inpatient Hospital Stay (HOSPITAL_COMMUNITY): Payer: PRIVATE HEALTH INSURANCE

## 2016-05-31 LAB — BLOOD GAS, ARTERIAL
Acid-Base Excess: 17.4 mmol/L — ABNORMAL HIGH (ref 0.0–2.0)
Bicarbonate: 43.7 mmol/L — ABNORMAL HIGH (ref 20.0–28.0)
Drawn by: 40415
FIO2: 0.4
MECHVT: 420 mL
O2 Saturation: 95.2 %
PEEP: 5 cmH2O
Patient temperature: 98.3
RATE: 24 resp/min
pCO2 arterial: 76.6 mmHg (ref 32.0–48.0)
pH, Arterial: 7.373 (ref 7.350–7.450)
pO2, Arterial: 78.3 mmHg — ABNORMAL LOW (ref 83.0–108.0)

## 2016-05-31 LAB — CBC WITH DIFFERENTIAL/PLATELET
Basophils Absolute: 0 10*3/uL (ref 0.0–0.1)
Basophils Relative: 0 %
Eosinophils Absolute: 0.4 10*3/uL (ref 0.0–0.7)
Eosinophils Relative: 9 %
HCT: 43.7 % (ref 36.0–46.0)
Hemoglobin: 12.7 g/dL (ref 12.0–15.0)
Lymphocytes Relative: 12 %
Lymphs Abs: 0.6 10*3/uL — ABNORMAL LOW (ref 0.7–4.0)
MCH: 23.7 pg — ABNORMAL LOW (ref 26.0–34.0)
MCHC: 29.1 g/dL — ABNORMAL LOW (ref 30.0–36.0)
MCV: 81.5 fL (ref 78.0–100.0)
Monocytes Absolute: 0.8 10*3/uL (ref 0.1–1.0)
Monocytes Relative: 16 %
Neutro Abs: 3 10*3/uL (ref 1.7–7.7)
Neutrophils Relative %: 63 %
Platelets: 177 10*3/uL (ref 150–400)
RBC: 5.36 MIL/uL — ABNORMAL HIGH (ref 3.87–5.11)
RDW: 20.7 % — ABNORMAL HIGH (ref 11.5–15.5)
WBC: 4.7 10*3/uL (ref 4.0–10.5)

## 2016-05-31 LAB — GLUCOSE, CAPILLARY
Glucose-Capillary: 100 mg/dL — ABNORMAL HIGH (ref 65–99)
Glucose-Capillary: 115 mg/dL — ABNORMAL HIGH (ref 65–99)
Glucose-Capillary: 79 mg/dL (ref 65–99)
Glucose-Capillary: 84 mg/dL (ref 65–99)
Glucose-Capillary: 84 mg/dL (ref 65–99)
Glucose-Capillary: 89 mg/dL (ref 65–99)
Glucose-Capillary: 99 mg/dL (ref 65–99)

## 2016-05-31 LAB — BASIC METABOLIC PANEL
Anion gap: 15 (ref 5–15)
BUN: 13 mg/dL (ref 6–20)
CO2: 45 mmol/L — ABNORMAL HIGH (ref 22–32)
Calcium: 8.1 mg/dL — ABNORMAL LOW (ref 8.9–10.3)
Chloride: 83 mmol/L — ABNORMAL LOW (ref 101–111)
Creatinine, Ser: 0.98 mg/dL (ref 0.44–1.00)
GFR calc Af Amer: >60 mL/min (ref >60)
GFR calc non Af Amer: >60 mL/min (ref >60)
Glucose, Bld: 77 mg/dL (ref 65–99)
Potassium: 3.2 mmol/L — ABNORMAL LOW (ref 3.5–5.1)
Sodium: 143 mmol/L (ref 135–145)

## 2016-05-31 LAB — MAGNESIUM
Magnesium: 1.2 mg/dL — ABNORMAL LOW (ref 1.7–2.4)
Magnesium: 2 mg/dL (ref 1.7–2.4)

## 2016-05-31 LAB — TRIGLYCERIDES: Triglycerides: 187 mg/dL — ABNORMAL HIGH (ref <150)

## 2016-05-31 LAB — PHOSPHORUS: Phosphorus: 3.6 mg/dL (ref 2.5–4.6)

## 2016-05-31 MED ORDER — INSULIN ASPART 100 UNIT/ML ~~LOC~~ SOLN: 2.0000 [IU] | SUBCUTANEOUS | Status: DC

## 2016-05-31 MED ORDER — MAGNESIUM SULFATE 50 % IJ SOLN
6.0000 g | Freq: Once | INTRAMUSCULAR | Status: AC
  Administered 2016-05-31: 6 g via INTRAVENOUS
  Filled 2016-05-31: qty 12

## 2016-05-31 MED ORDER — ASPIRIN 81 MG PO CHEW
81.0000 mg | CHEWABLE_TABLET | Freq: Once | ORAL | Status: AC
  Administered 2016-05-31: 81 mg
  Filled 2016-05-31: qty 1

## 2016-05-31 MED ORDER — VITAL HIGH PROTEIN PO LIQD
1000.0000 mL | ORAL | Status: DC
  Administered 2016-05-31: 1000 mL
  Administered 2016-06-01 (×3)
  Administered 2016-06-01: 1000 mL
  Administered 2016-06-01 – 2016-06-02 (×8)

## 2016-05-31 MED ORDER — POTASSIUM CHLORIDE 20 MEQ/15ML (10%) PO SOLN
30.0000 meq | ORAL | Status: DC
  Administered 2016-05-31: 30 meq
  Filled 2016-05-31: qty 30

## 2016-05-31 MED ORDER — ENOXAPARIN SODIUM 100 MG/ML ~~LOC~~ SOLN
90.0000 mg | SUBCUTANEOUS | Status: DC
  Administered 2016-05-31: 90 mg via SUBCUTANEOUS
  Filled 2016-05-31: qty 1

## 2016-05-31 MED ORDER — PROPOFOL 1000 MG/100ML IV EMUL
INTRAVENOUS | Status: AC
  Filled 2016-05-31: qty 100

## 2016-05-31 MED ORDER — ACETAZOLAMIDE SODIUM 500 MG IJ SOLR
250.0000 mg | Freq: Four times a day (QID) | INTRAMUSCULAR | Status: AC
  Administered 2016-05-31 (×3): 250 mg via INTRAVENOUS
  Filled 2016-05-31 (×3): qty 250

## 2016-05-31 MED ORDER — FENTANYL CITRATE (PF) 100 MCG/2ML IJ SOLN
25.0000 ug | INTRAMUSCULAR | Status: DC | PRN
  Administered 2016-06-02 – 2016-06-22 (×4): 50 ug via INTRAVENOUS
  Filled 2016-05-31 (×2): qty 2

## 2016-05-31 MED ORDER — PRO-STAT SUGAR FREE PO LIQD
60.0000 mL | Freq: Four times a day (QID) | ORAL | Status: DC
  Administered 2016-05-31 – 2016-06-02 (×8): 60 mL
  Filled 2016-05-31 (×8): qty 60

## 2016-05-31 MED ORDER — POLYETHYLENE GLYCOL 3350 17 G PO PACK
17.0000 g | PACK | Freq: Every day | ORAL | Status: DC
  Administered 2016-05-31 – 2016-06-03 (×4): 17 g via ORAL
  Filled 2016-05-31 (×6): qty 1

## 2016-05-31 MED ORDER — POTASSIUM CHLORIDE 20 MEQ/15ML (10%) PO SOLN
40.0000 meq | Freq: Four times a day (QID) | ORAL | Status: AC
  Administered 2016-05-31 (×3): 40 meq
  Filled 2016-05-31 (×3): qty 30

## 2016-05-31 NOTE — Progress Notes (Signed)
Nutrition Follow-up  DOCUMENTATION CODES:   Morbid obesity  INTERVENTION:   Vital High Protein @ 10 ml/hr (240 ml/day) 60 ml Prostat QID Provides: 1040 kcal, 141 grams protein, and 200 ml H2O.  TF regimen and propofol at current rate providing 2249 total kcal/day    NUTRITION DIAGNOSIS:   Inadequate oral intake related to inability to eat as evidenced by NPO status. Ongoing.   GOAL:   Provide needs based on ASPEN/SCCM guidelines Progressing.   MONITOR:   Vent status, Labs, Weight trends, Skin, I & O's  REASON FOR ASSESSMENT:   Consult Enteral/tube feeding initiation and management  ASSESSMENT:   [name] with PMH of morbid obesity (469 lbs), DM, HTN and tachycardia who presented to [facility] on 10/3 with reports of abdominal bloating and heavy abdominal pressure.    Discussed with RN. Pt is diuresing and very fluid overloaded.  Patient is currently intubated on ventilator support  Propofol: 45.8 ml/hr provides: 1209 kcal per day from lipid Medications reviewed and include: miralax Labs reviewed: K+ 3.2, magnesium 1.2 OG tube in body of stomach  Diet Order:  Diet NPO time specified  Skin:  Wound (see comment) (wound on R and L tibial)  Last BM:  10/6  Height:   Ht Readings from Last 1 Encounters:  05/28/16 5' 3" (1.6 m)    Weight:   Wt Readings from Last 1 Encounters:  05/31/16 (!) 400 lb (181.4 kg)    Ideal Body Weight:  52.27 kg  BMI:  Body mass index is 70.86 kg/m.  Estimated Nutritional Needs:   Kcal:  1150-1307  Protein:  >/= 130 grams  Fluid:  Per MD  EDUCATION NEEDS:   Education needs no appropriate at this time  [place], [place], [place] Pager [phone] After Hours Pager

## 2016-05-31 NOTE — Progress Notes (Signed)
PULMONARY / CRITICAL CARE MEDICINE   Name: [name] [name] MRN: [id number] DOB: Oct 27, 1980    ADMISSION DATE:  05/25/2016 CONSULTATION DATE:  05/27/16  REFERRING MD:  Dr. Ree Kida - [facility]  CHIEF COMPLAINT:  Altered Mental Status   BRIEF [name] with PMH of morbid obesity (469 lbs), DM, HTN and tachycardia who presented to [facility] on 10/3 with reports of abdominal bloating and heavy abdominal pressure.    On admit, the patient reported she had not taken her blood pressure medications in over one year.  She was profoundly hypertensive on admit as well, initial BP 215/151.  She was found to be hypoxic with saturations in the 70's which improved with 3L O2.  D-Dimer was elevated at 2.59 and a CTA of the chest showed CHF but no PE.  BNP 594.  The patient was admitted per [facility] and treated for acute decompensated CHF.  She was treated with IV diuresis, blood pressure control, afterload reduction and fluid restriction.  ECHO assessment revealed LVEF 50-55%, difficult to assess LV diastolic function, mildly dilated lA, RA, PA peak 42 mm Hg.  On 10/5 afternoon, the patient was found to be altered and difficult to arouse.  Rapid response team was activated.  ABG assessed > 7.16 & pCO2 > 100.  The patient placed on BiPAP and transferred to ICU.  With stimulation of transfer, the patient woke some and was able to interact with staff.    STUDIES:  ECHO 10/4 >> EF 55%, PAP 42.  CULTURES:   ANTIBIOTICS:   SIGNIFICANT EVENTS: 10/3  Admit. 10/5  Transferred to ICU due to AMS, hypercarbic respiratory failure 05/28/16:  RN reports pt remains on bipap, ntg gtt continues & BP remains elevated in 962'E systolic.  Net negative 5.5 L in last 24 hours with improvement in sr cr  05/29/16 - intubated overnight . Not on presssors. Being diursed. Per RN - cards told her that patient is 7# down in fluids overnight. SBP @ 140s. On labetalol - vagaled with suction - hr drpped to 50s x 2 despite being off diprivan. She is on  labetalol and imdur - rn uanble to crush imdur   SUBJECTIVE/OVERNIGHT/INTERVAL HX 10/8 - 13L negative since admit 05/25/16. Weight from 211kg  -> 186 kg. Possible low grade fever developing. Improved pc02. Aggressive lasix cntinues. Follows commands off sedation diprivan gtt. No more vagal episodes after labetalol decrease. Still needing 60% fio2  VITAL SIGNS: BP 134/81   Pulse 84   Temp 98.5 F (36.9 C) (Oral)   Resp 13   Ht _0  (1.6 m)   Wt (!) 181.4 kg (400 lb)   LMP  (LMP Unknown)   SpO2 91%   BMI 70.86 kg/m   HEMODYNAMICS:    VENTILATOR SETTINGS: Vent Mode: PRVC FiO2 (%):  [50 %-60 %] 50 % Set Rate:  [24 bmp] 24 bmp Vt Set:  [420 mL] 420 mL PEEP:  [5 cmH20] 5 cmH20 Plateau Pressure:  [28 cmH20-30 cmH20] 28 cmH20  INTAKE / OUTPUT: I/O last 3 completed shifts: In: 2432.9 [I.V.:1692.9; Other:80; NG/GT:60; IV Piggyback:600] Out: 36629 [Urine:10450]  PHYSICAL EXAMINATION: General:  Morbidly obese female intubated and sedated, easily arousable.  Neuro:  Arousable, moving all ext to command, gaging. HEENT:  MM pink/moist, unable to assess JVD  Cardiovascular:  RRR, Nl S1/S2, -M/R/G, difficult to fully auscultate  Lungs:  Shallow, non-labored, lungs bilaterally diminished with diffuse crackles Abdomen:  Obese, soft, bsx4 active  Musculoskeletal:  No acute deformities  Skin:  Warm/dry, generalized edema  LABS: PULMONARY  Recent Labs Lab 05/28/16 0005 05/28/16 0835 05/28/16 1735 05/28/16 1940 05/28/16 2345  PHART 7.286* 7.303* 7.264* 7.260* 7.373  PCO2ART 84.7* 89.8* 102* 105* 76.6*  PO2ART 126* 110.0* 89.4 84.1 78.3*  HCO3 39.1* 43.2* 44.5* 45.8* 43.7*  O2SAT 98.4 97.6 95.3 95.1 95.2   CBC  Recent Labs Lab 05/27/16 0339 05/29/16 0212 05/31/16 0313  HGB 13.5 12.2 12.7  HCT 46.9* 42.6 43.7  WBC 6.0 3.6* 4.7  PLT 189 158 177   COAGULATION  Recent Labs Lab 05/26/16 0435  INR 1.28   CARDIAC   Recent Labs Lab 05/25/16 2305 05/26/16 0435  05/26/16 0946  TROPONINI 0.06* 0.10* 0.06*   No results for input(s): PROBNP in the last 168 hours.  CHEMISTRY  Recent Labs Lab 05/27/16 1636 05/28/16 0205 05/29/16 0212 05/30/16 1639 05/31/16 0313  NA 139 141 142 143 143  K 5.1 4.5 4.0 3.0* 3.2*  CL 97* 95* 93* 88* 83*  CO2 35* 36* 41* 45* 45*  GLUCOSE 121* 91 73 79 77  BUN 31* 29* 25* 17 13  CREATININE 1.35* 1.04* 0.97 0.99 0.98  CALCIUM 8.5* 8.8* 8.7* 8.3* 8.1*  MG 1.9  --   --   --  1.2*  PHOS 5.6*  --   --   --  3.6   Estimated Creatinine Clearance: 130.3 mL/min (by C-G formula based on SCr of 0.98 mg/dL).  LIVER  Recent Labs Lab 05/25/16 1240 05/26/16 0435  AST 34  --   ALT 24  --   ALKPHOS 102  --   BILITOT 2.3*  --   PROT 7.5  --   ALBUMIN 2.8*  --   INR  --  1.28   INFECTIOUS  Recent Labs Lab 05/27/16 1636  LATICACIDVEN 1.7     ENDOCRINE CBG (last 3)   Recent Labs  05/30/16 2359 05/31/16 0337 05/31/16 0733  GLUCAP 99 84 100*         IMAGING x48h  - image(s) personally visualized  -   highlighted in bold Dg Chest Port 1 View  Result Date: 05/31/2016 CLINICAL DATA:  ET tube placement EXAM: PORTABLE CHEST 1 VIEW COMPARISON:  05/30/2016 FINDINGS: Endotracheal tube with the tip 2.5 cm above the carina. Nasogastric tube coursing below the diaphragm. Bilateral interstitial and alveolar airspace opacities with enlargement of the central pulmonary vasculature. No pneumothorax. Trace bilateral pleural effusions. Stable cardiomegaly. No acute osseous abnormality. IMPRESSION: 1. Endotracheal tube with the tip 2.5 cm above the carina. 2. Bilateral interstitial and alveolar airspace opacities with cardiomegaly compatible with CHF versus multilobar pneumonia. Electronically Signed   By: Kathreen Devoid   On: 05/31/2016 07:03   Dg Chest Port 1 View  Result Date: 05/30/2016 CLINICAL DATA:  Shortness of Breath EXAM: PORTABLE CHEST 1 VIEW COMPARISON:  May 29, 2016 FINDINGS: Endotracheal tube tip is 3.0  cm above the carina. Nasogastric tube tip and side port below the diaphragm. No pneumothorax. There is cardiomegaly with pulmonary venous hypertension. There are bilateral pleural effusions with patchy interstitial alveolar edema bilaterally, primarily in the bases. There are air bronchograms in both lower lobes. No adenopathy evident. IMPRESSION: Tube positions as described without pneumothorax. Evidence congestive heart failure. Question superimposed pneumonia in the bases. The appearance is stable compared to 1 day prior. Electronically Signed   By: Lowella Grip III M.D.   On: 05/30/2016 08:21   Dg Chest Port 1 View  Result Date: 05/29/2016 CLINICAL DATA:  Acute respiratory  failure with hypoxia. On ventilator. EXAM: PORTABLE CHEST 1 VIEW COMPARISON:  05/28/2016 FINDINGS: Endotracheal tube and nasogastric tube remain in appropriate position. Worsening diffuse bilateral pulmonary airspace disease is seen since previous study. Heart size remains stable. No pneumothorax visualized. IMPRESSION: Interval worsening of diffuse bilateral pulmonary airspace disease. Electronically Signed   By: Earle Gell M.D.   On: 05/29/2016 11:16       DISCUSSION: [name] with PMH of super morbid obesity, HTN, pulmonary hypertension admitted with decompensated CHF.  Decompensated 10/5 with hypercarbic respiratory failure.      PULMONARY A: Acute on Chronic Hypoxemic and Hypercarbic Respiratory Failure. Secondary pulmonary hypertension. Severe OSA / OHS with acute Decompensation - sleep study in 2012 with up to 75 events per hour with desaturation to 72%  P:   Full vent support Continue aggressive diuresis as BP and SCr permit. Pulmonary hygiene. O2 as needed to support sats > 92% Will need ambulatory saturation assessment prior to discharge CXR intermittently.  CARDIOVASCULAR A:  Hypertensive Urgency. Acute diastolic heart failure. Elevated troponin - due to demand ischemia. Hx HTN.  P:   Hydralazine 50 mg QD  Labetalol 200 mg TID PRN Hydralazine for SBP > 150 (with holding parameters for SBP < 110). Goal SBP < 150 per RN Cardiology following, appreciate the assistance. Continue aggressive diuresis as BP and SCr permit.  RENAL A:   AKI - improving P:   KVO fluids. Correct electrolytes as indicated. Trend BMP / UOP Lasix 80 mg IV q6. Diamox 250 q6 x3 doses KCl PO.  GASTROINTESTINAL A:   Morbid obesity. Nutrition. P:   NPO. TF per nutrition. Pt will need outpatient referral to Dr. Hassell Done for consideration of bypass surgery  HEMATOLOGIC A:   VTE prophylaxis. P:  SCD's / Lovenox for DVT prophylaxis CBC in AM.  INFECTIOUS A:   No indication of infection. P:   Monitor clinically.  ENDOCRINE A:   No acute issues. P:   No interventions required.  NEUROLOGIC A:   Acute encephalopathy - in the setting of hypercarbia. Depression - family reports pt is in "denial" - seen by Psych 05/28/16  P:   Monitor clinically. Limit sedating meds. Follow ABG intermittently   REPRODUCTIVE A: R/o pregnancy - had "abd bloating" x 1 week upon presentation 10/3, negative P: No acute interventions  FAMILY  - Updates: Family updated bedside 10/09.  - Inter-disciplinary family meet or Palliative Care meeting due by: 10/12  The patient is critically ill with multiple organ systems failure and requires high complexity decision making for assessment and support, frequent evaluation and titration of therapies, application of advanced monitoring technologies and extensive interpretation of multiple databases.   Critical Care Time devoted to patient care services described in this note is  35  Minutes. This time reflects time of care of this signee Dr Jennet Maduro. This critical care time does not reflect procedure time, or teaching time or supervisory time of PA/NP/Med student/Med Resident etc but could involve care discussion time.  Rush Farmer,  M.D. [facility] Pulmonary/Critical Care Medicine. Pager: [phone]. After hours pager: [phone].  05/31/2016 9:12 AM

## 2016-05-31 NOTE — Progress Notes (Signed)
Patient Name: [name] [name] Date of Encounter: 05/31/2016  Hospital Problem List     Principal Problem:   Acute congestive heart failure [facility]) Active Problems:   Malignant hypertension   Morbid obesity (HCC)   Sleep apnea   Renal insufficiency   Acute pulmonary edema (HCC)   Abdominal pain   Hepatic congestion   Sinus tachycardia   Pericardial effusion   Pulmonary hypertension   Acute respiratory failure with hypoxia [facility])    Patient Profile     [name] with PMH of morbid obesity (469 lbs), DM, HTN and tachycardia who presented to [facility] on 10/3 with reports of abdominal bloating and heavy abdominal pressure.  On admit, the patient reported she had not taken her blood pressure medications in over one year.  She was profoundly hypertensive on admit as well, initial BP 215/151.  She was found to be hypoxic with saturations in the 70'.  EF was poor quality but appeared to be preserved EF.  She was eventually intubated   Subjective   Intubated and sedated.  Pink frothy sputum from the ET.   Inpatient Medications    . antiseptic oral rinse  15 mL Mouth Rinse QID  . chlorhexidine  15 mL Mouth Rinse BID  . chlorhexidine gluconate (MEDLINE KIT)  15 mL Mouth Rinse BID  . enoxaparin (LOVENOX) injection  100 mg Subcutaneous Q24H  . famotidine (PEPCID) IV  20 mg Intravenous Q12H  . fluconazole  100 mg Oral Daily  . fluconazole  200 mg Oral Once  . furosemide  80 mg Intravenous Q6H  . hydrALAZINE  50 mg Oral Q8H  . isosorbide dinitrate  15 mg Oral BID  . labetalol  200 mg Oral TID  . magnesium sulfate LVP 250-500 ml  6 g Intravenous Once  . mouth rinse  15 mL Mouth Rinse 10 times per day  . potassium chloride  30 mEq Per Tube Q4H  . sodium chloride flush  3 mL Intravenous Q12H    Vital Signs    Vitals:   05/31/16 0448 05/31/16 0500 05/31/16 0503 05/31/16 0734  BP:   124/66   Pulse: 88     Resp: (!) 24     Temp:    98.5 F (36.9 C)  TempSrc:    Oral  SpO2: 94%       Weight:  (!) 400 lb (181.4 kg)    Height:        Intake/Output Summary (Last 24 hours) at 05/31/16 0746 Last data filed at 05/31/16 0600  Gross per 24 hour  Intake           1770.9 ml  Output             6650 ml  Net          -4879.1 ml   Filed Weights   05/29/16 0500 05/30/16 0333 05/31/16 0500  Weight: (!) 421 lb (191 kg) (!) 411 lb (186.4 kg) (!) 400 lb (181.4 kg)    Physical Exam    GEN: Well nourished, well developed, intubated Neck:   Difficult to examine with intubation and large nec, Cardiac: RRR, no  rubs, or gallops. No clubbing, cyanosis, diffuese edema.  Radials/DP/PT 2+ and equal bilaterally.  Respiratory:  Respirations  regular but decreased GI: Soft, nontender, nondistended, BS + x 4. Neuro:  Sedated   Labs    CBC  Recent Labs  05/29/16 0212 05/31/16 0313  WBC 3.6* 4.7  NEUTROABS  --  3.0  HGB 12.2 12.7  HCT 42.6 43.7  MCV 83.4 81.5  PLT 158 532   Basic Metabolic Panel  Recent Labs  05/30/16 1639 05/31/16 0313  NA 143 143  K 3.0* 3.2*  CL 88* 83*  CO2 45* 45*  GLUCOSE 79 77  BUN 17 13  CREATININE 0.99 0.98  CALCIUM 8.3* 8.1*  MG  --  1.2*  PHOS  --  3.6   Liver Function Tests No results for input(s): AST, ALT, ALKPHOS, BILITOT, PROT, ALBUMIN in the last 72 hours. No results for input(s): LIPASE, AMYLASE in the last 72 hours. Cardiac Enzymes No results for input(s): CKTOTAL, CKMB, CKMBINDEX, TROPONINI in the last 72 hours. BNP Invalid input(s): POCBNP D-Dimer No results for input(s): DDIMER in the last 72 hours. Hemoglobin A1C No results for input(s): HGBA1C in the last 72 hours. Fasting Lipid Panel  Recent Labs  05/28/16 2152  TRIG 116   Thyroid Function Tests No results for input(s): TSH, T4TOTAL, T3FREE, THYROIDAB in the last 72 hours.  Invalid input(s): FREET3  Telemetry    Sinus, sinus tach  ECG    NA  Radiology    Dg Chest 2 View  Result Date: 05/25/2016 CLINICAL DATA:  Chest pain and shortness of  breath, 2 days duration. EXAM: CHEST  2 VIEW COMPARISON:  None. FINDINGS: Cardiac silhouette is markedly enlarged consistent with cardiomegaly and/or pericardial fluid. There is pulmonary venous hypertension with interstitial and alveolar pulmonary edema. No pleural effusion at this time. Bony structures are unremarkable. IMPRESSION: Enlarged cardiac silhouette consistent with cardiomegaly and/or pericardial fluid. Congestive heart failure with interstitial and alveolar edema. Electronically Signed   By: Nelson Chimes M.D.   On: 05/25/2016 13:14   Ct Angio Chest Pe W And/or Wo Contrast  Result Date: 05/25/2016 CLINICAL DATA:  Shortness of Breath and elevated D-dimer EXAM: CT ANGIOGRAPHY CHEST WITH CONTRAST TECHNIQUE: Multidetector CT imaging of the chest was performed using the standard protocol during bolus administration of intravenous contrast. Multiplanar CT image reconstructions and MIPs were obtained to evaluate the vascular anatomy. CONTRAST:  80 mL Isovue 370 nonionic COMPARISON:  Chest radiograph May 25, 2016 FINDINGS: Cardiovascular: There is no central vessel pulmonary embolus. Due to attenuation artifact due to the patient's large size, assessment of the peripheral pulmonary arterial vessels is less than optimal. There is no obvious pulmonary embolus more distally allowing for a degree of attenuation artifact which is symmetric bilaterally. There is no demonstrable thoracic aortic aneurysm or dissection. The main pulmonary outflow tract measures 4.9 cm which is felt to be compatible with a degree of pulmonary arterial hypertension. Visualized great vessels appear unremarkable. There is cardiomegaly. There is pericardial fluid along the right side of the heart laterally. Mediastinum/Nodes: Thyroid appears unremarkable. There is no appreciable thoracic adenopathy. Lungs/Pleura: There is pulmonary edema throughout the lungs bilaterally, most pronounced in the perihilar and lower lobe regions but also  moderate in the inferior lingula and right middle lobe regions. There is no well-defined airspace consolidation. There are no appreciable pleural effusions. Upper Abdomen: There is reflux of contrast into the inferior vena cava and pulmonary veins. Visualized upper abdominal structures otherwise appear unremarkable. Musculoskeletal: There is degenerative change in the thoracic spine with diffuse idiopathic skeletal hyperostosis. No blastic or lytic bone lesions are evident. Review of the MIP images confirms the above findings. IMPRESSION: No pulmonary embolus demonstrable. Visualization of the peripheral pulmonary arteries is less than optimal due to attenuation artifact from patient's large size. Main pulmonary outflow tract enlargement  consistent with pulmonary arterial hypertension. Cardiomegaly with pulmonary edema consistent with congestive heart failure. Small pericardial effusion in the right heart region laterally. No pleural effusions evident. No adenopathy. Reflux of contrast in the inferior vena cava and hepatic veins is felt to be indicative of increased right heart pressure. Diffuse idiopathic skeletal hyperostosis in the thoracic spine. Electronically Signed   By: Lowella Grip III M.D.   On: 05/25/2016 14:55   Dg Chest Port 1 View  Result Date: 05/31/2016 CLINICAL DATA:  ET tube placement EXAM: PORTABLE CHEST 1 VIEW COMPARISON:  05/30/2016 FINDINGS: Endotracheal tube with the tip 2.5 cm above the carina. Nasogastric tube coursing below the diaphragm. Bilateral interstitial and alveolar airspace opacities with enlargement of the central pulmonary vasculature. No pneumothorax. Trace bilateral pleural effusions. Stable cardiomegaly. No acute osseous abnormality. IMPRESSION: 1. Endotracheal tube with the tip 2.5 cm above the carina. 2. Bilateral interstitial and alveolar airspace opacities with cardiomegaly compatible with CHF versus multilobar pneumonia. Electronically Signed   By: Kathreen Devoid    On: 05/31/2016 07:03   Dg Chest Port 1 View  Result Date: 05/30/2016 CLINICAL DATA:  Shortness of Breath EXAM: PORTABLE CHEST 1 VIEW COMPARISON:  May 29, 2016 FINDINGS: Endotracheal tube tip is 3.0 cm above the carina. Nasogastric tube tip and side port below the diaphragm. No pneumothorax. There is cardiomegaly with pulmonary venous hypertension. There are bilateral pleural effusions with patchy interstitial alveolar edema bilaterally, primarily in the bases. There are air bronchograms in both lower lobes. No adenopathy evident. IMPRESSION: Tube positions as described without pneumothorax. Evidence congestive heart failure. Question superimposed pneumonia in the bases. The appearance is stable compared to 1 day prior. Electronically Signed   By: Lowella Grip III M.D.   On: 05/30/2016 08:21   Dg Chest Port 1 View  Result Date: 05/29/2016 CLINICAL DATA:  Acute respiratory failure with hypoxia. On ventilator. EXAM: PORTABLE CHEST 1 VIEW COMPARISON:  05/28/2016 FINDINGS: Endotracheal tube and nasogastric tube remain in appropriate position. Worsening diffuse bilateral pulmonary airspace disease is seen since previous study. Heart size remains stable. No pneumothorax visualized. IMPRESSION: Interval worsening of diffuse bilateral pulmonary airspace disease. Electronically Signed   By: Earle Gell M.D.   On: 05/29/2016 11:16   Dg Chest Port 1 View  Result Date: 05/28/2016 CLINICAL DATA:  Endotracheal tube adjustment.  Initial encounter. EXAM: PORTABLE CHEST 1 VIEW COMPARISON:  Chest radiograph performed earlier today at 9:21 p.m. FINDINGS: The endotracheal tube is seen ending approximately 1 cm above the carina. This could be retracted 2 - 3 cm. The enteric tube is noted extending below the diaphragm. The lungs are mildly hypoexpanded. Vascular congestion and vascular crowding are seen. Increased interstitial markings raise concern for mild pulmonary edema. No pleural effusion or pneumothorax is seen.  The cardiomediastinal silhouette is enlarged. No acute osseous abnormalities are identified. IMPRESSION: 1. Endotracheal tube seen ending 1 cm above the carina. This could be retracted 2 - 3 cm. 2. Lungs mildly hypoexpanded. Vascular congestion and cardiomegaly. Increased interstitial markings raise concern for mild pulmonary edema. Electronically Signed   By: Garald Balding M.D.   On: 05/28/2016 22:44   Dg Chest Port 1 View  Result Date: 05/28/2016 CLINICAL DATA:  Endotracheal tube and orogastric tube placement. EXAM: PORTABLE CHEST 1 VIEW COMPARISON:  Chest radiograph 05/28/2016 at 4:57 a.m. FINDINGS: Endotracheal tube tip is just below the level of the clavicular heads, 1.5 cm above the inferior margin of the carina. Orogastric 2 courses below the diaphragm and  beyond the field of view. There is unchanged cardiomegaly with diffuse pulmonary edema. Small left pleural effusion. No pneumothorax visualized. IMPRESSION: 1. Endotracheal tube tip just below the level of the clavicular heads, approximately 1.5 cm above the inferior margin of the carina. 2. Unchanged cardiomegaly and diffuse pulmonary edema. Electronically Signed   By: Ulyses Jarred M.D.   On: 05/28/2016 22:01   Dg Chest Port 1 View  Result Date: 05/28/2016 CLINICAL DATA:  Acute CHF. EXAM: PORTABLE CHEST 1 VIEW COMPARISON:  05/27/2016. FINDINGS: Cardiomegaly with pulmonary vascular prominence and bilateral interstitial prominence noted consistent with CHF. Similar findings noted on prior exam. Small pleural effusions cannot be excluded. No pneumothorax . IMPRESSION: Congestive heart failure with pulmonary interstitial edema. Small bilateral pleural effusions cannot be excluded. No significant interim change from prior exam. Electronically Signed   By: [place]   On: 05/28/2016 07:14   Dg Chest Port 1 View  Result Date: 05/27/2016 CLINICAL DATA:  Loss of consciousness. EXAM: PORTABLE CHEST 1 VIEW COMPARISON:  05/25/2016 FINDINGS: Again  noted is marked cardiomegaly. Diffuse pulmonary infiltrates are again identified, consistent with pulmonary edema and/or infectious process. IMPRESSION: Stable appearance of cardiomegaly and pulmonary infiltrates. Electronically Signed   By: Nolon Nations M.D.   On: 05/27/2016 14:52   Dg Abd Portable 1v  Result Date: 05/28/2016 CLINICAL DATA:  Orogastric tube placement.  Initial encounter. EXAM: PORTABLE ABDOMEN - 1 VIEW COMPARISON:  None. FINDINGS: The patient's orogastric tube is noted ending about the body of the stomach. The bowel gas pattern is difficult to assess given motion artifact. No acute osseous abnormalities are seen. Mild left basilar airspace opacity is seen. IMPRESSION: Orogastric tube noted ending about the body of the stomach. Electronically Signed   By: Garald Balding M.D.   On: 05/28/2016 22:46    Assessment & Plan    ACUTE DIASTOLIC HF:   Continuing aggressive diuresis.  Greater than 4 liters out last 24 hours.  Continued evidence of pulmonary edema.  Continue diuresis.   OBESITY:  PULMONARY HTN:  Secondary to obesity, diastolic dysfunction.   DEMAND ISCHEMIA:   Start ASA 81 mg  HTN:    BP is currently well controlled.   Continue current therapy.   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   HYPOKALEMIA:  Supplemented by the primary team.   Signed, Minus Breeding, MD  05/31/2016, 7:46 AM

## 2016-05-31 NOTE — Progress Notes (Signed)
[facility] ADULT ICU REPLACEMENT PROTOCOL FOR AM LAB REPLACEMENT ONLY  The patient does apply for the [facility] Adult ICU Electrolyte Replacment Protocol based on the criteria listed below:   1. Is GFR >/= 40 ml/min? Yes.    Patient's GFR today is >60 2. Is urine output >/= 0.5 ml/kg/hr for the last 6 hours? Yes.   Patient's UOP is 1.2 ml/kg/hr 3. Is BUN < 60 mg/dL? Yes.    Patient's BUN today is 13 4. Abnormal electrolyte(s): K 3.2, Mag 1.2 5. Ordered repletion with: per protocol 6. If a panic level lab has been reported, has the CCM MD in charge been notified? No..   Physician:    [name] A 05/31/2016 4:12 AM

## 2016-05-31 NOTE — Progress Notes (Signed)
PT Cancellation Note  Patient Details Name: Chiyo Fay MRN: [id number] DOB: [birth year]   Cancelled Treatment:    Reason Eval/Treat Not Completed: Medical issues which prohibited therapy   Per RN, pt is sedated and not following commands. Will continue to monitor ability to participate with therapy   Branndon Tuite 05/31/2016, 10:59 AM Pager [phone]

## 2016-06-01 ENCOUNTER — Inpatient Hospital Stay (HOSPITAL_COMMUNITY): Payer: PRIVATE HEALTH INSURANCE

## 2016-06-01 DIAGNOSIS — I509 Heart failure, unspecified: Secondary | ICD-10-CM

## 2016-06-01 DIAGNOSIS — Z789 Other specified health status: Secondary | ICD-10-CM

## 2016-06-01 LAB — CBC WITH DIFFERENTIAL/PLATELET
Basophils Absolute: 0 10*3/uL (ref 0.0–0.1)
Basophils Relative: 0 %
Eosinophils Absolute: 0.5 10*3/uL (ref 0.0–0.7)
Eosinophils Relative: 11 %
HCT: 45.1 % (ref 36.0–46.0)
Hemoglobin: 12.9 g/dL (ref 12.0–15.0)
Lymphocytes Relative: 9 %
Lymphs Abs: 0.4 10*3/uL — ABNORMAL LOW (ref 0.7–4.0)
MCH: 23.8 pg — ABNORMAL LOW (ref 26.0–34.0)
MCHC: 28.6 g/dL — ABNORMAL LOW (ref 30.0–36.0)
MCV: 83.4 fL (ref 78.0–100.0)
Monocytes Absolute: 0.8 10*3/uL (ref 0.1–1.0)
Monocytes Relative: 18 %
Neutro Abs: 2.8 10*3/uL (ref 1.7–7.7)
Neutrophils Relative %: 62 %
Platelets: 183 10*3/uL (ref 150–400)
RBC: 5.41 MIL/uL — ABNORMAL HIGH (ref 3.87–5.11)
RDW: 21 % — ABNORMAL HIGH (ref 11.5–15.5)
WBC: 4.5 10*3/uL (ref 4.0–10.5)

## 2016-06-01 LAB — POCT I-STAT 3, ART BLOOD GAS (G3+)
Acid-Base Excess: 22 mmol/L — ABNORMAL HIGH (ref 0.0–2.0)
Bicarbonate: 50.3 mmol/L — ABNORMAL HIGH (ref 20.0–28.0)
O2 Saturation: 96 %
Patient temperature: 99.2
TCO2: >50 mmol/L (ref 0–100)
pCO2 arterial: 70.2 mmHg (ref 32.0–48.0)
pH, Arterial: 7.464 — ABNORMAL HIGH (ref 7.350–7.450)
pO2, Arterial: 86 mmHg (ref 83.0–108.0)

## 2016-06-01 LAB — BLOOD GAS, ARTERIAL
Acid-Base Excess: 21.7 mmol/L — ABNORMAL HIGH (ref 0.0–2.0)
Bicarbonate: 48.1 mmol/L — ABNORMAL HIGH (ref 20.0–28.0)
Drawn by: 129711
FIO2: 50
MECHVT: 420 mL
O2 Saturation: 94.4 %
PEEP: 5 cmH2O
Patient temperature: 99.5
RATE: 21 resp/min
pCO2 arterial: 81.8 mmHg (ref 32.0–48.0)
pH, Arterial: 7.389 (ref 7.350–7.450)
pO2, Arterial: 86.6 mmHg (ref 83.0–108.0)

## 2016-06-01 LAB — GLUCOSE, CAPILLARY
Glucose-Capillary: 103 mg/dL — ABNORMAL HIGH (ref 65–99)
Glucose-Capillary: 107 mg/dL — ABNORMAL HIGH (ref 65–99)
Glucose-Capillary: 110 mg/dL — ABNORMAL HIGH (ref 65–99)
Glucose-Capillary: 117 mg/dL — ABNORMAL HIGH (ref 65–99)
Glucose-Capillary: 123 mg/dL — ABNORMAL HIGH (ref 65–99)
Glucose-Capillary: 123 mg/dL — ABNORMAL HIGH (ref 65–99)

## 2016-06-01 LAB — BASIC METABOLIC PANEL
Anion gap: 11 (ref 5–15)
BUN: 15 mg/dL (ref 6–20)
CO2: 44 mmol/L — ABNORMAL HIGH (ref 22–32)
Calcium: 8.4 mg/dL — ABNORMAL LOW (ref 8.9–10.3)
Chloride: 88 mmol/L — ABNORMAL LOW (ref 101–111)
Creatinine, Ser: 0.98 mg/dL (ref 0.44–1.00)
GFR calc Af Amer: >60 mL/min (ref >60)
GFR calc non Af Amer: >60 mL/min (ref >60)
Glucose, Bld: 116 mg/dL — ABNORMAL HIGH (ref 65–99)
Potassium: 4 mmol/L (ref 3.5–5.1)
Sodium: 143 mmol/L (ref 135–145)

## 2016-06-01 LAB — PHOSPHORUS: Phosphorus: 4.5 mg/dL (ref 2.5–4.6)

## 2016-06-01 LAB — MAGNESIUM: Magnesium: 1.9 mg/dL (ref 1.7–2.4)

## 2016-06-01 MED ORDER — SODIUM CHLORIDE 0.9% FLUSH: 10.0000 mL | INTRAVENOUS | Status: DC | PRN

## 2016-06-01 MED ORDER — SODIUM CHLORIDE 0.9% FLUSH
10.0000 mL | Freq: Two times a day (BID) | INTRAVENOUS | Status: DC
  Administered 2016-06-01 – 2016-06-04 (×7): 10 mL
  Administered 2016-06-05: 20 mL
  Administered 2016-06-05 – 2016-06-07 (×3): 10 mL
  Administered 2016-06-07: 20 mL
  Administered 2016-06-08: 10 mL
  Administered 2016-06-08: 20 mL
  Administered 2016-06-09 – 2016-06-10 (×3): 10 mL
  Administered 2016-06-11: 30 mL
  Administered 2016-06-11 – 2016-06-15 (×7): 10 mL

## 2016-06-01 MED ORDER — MIDAZOLAM HCL 2 MG/2ML IJ SOLN
1.0000 mg | INTRAMUSCULAR | Status: DC | PRN
  Administered 2016-06-01 – 2016-06-08 (×8): 2 mg via INTRAVENOUS
  Administered 2016-06-09: 1 mg via INTRAVENOUS
  Filled 2016-06-01 (×9): qty 2

## 2016-06-01 MED ORDER — FENTANYL 2500MCG IN NS 250ML (10MCG/ML) PREMIX INFUSION
25.0000 ug/h | INTRAVENOUS | Status: DC
  Administered 2016-06-01 – 2016-06-02 (×2): 50 ug/h via INTRAVENOUS
  Administered 2016-06-04: 100 ug/h via INTRAVENOUS
  Administered 2016-06-05: 50 ug/h via INTRAVENOUS
  Administered 2016-06-06: 100 ug/h via INTRAVENOUS
  Administered 2016-06-07: 50 ug/h via INTRAVENOUS
  Administered 2016-06-09: 100 ug/h via INTRAVENOUS
  Administered 2016-06-11: 200 ug/h via INTRAVENOUS
  Administered 2016-06-12: 100 ug/h via INTRAVENOUS
  Filled 2016-06-01 (×8): qty 250

## 2016-06-01 MED ORDER — ACETAZOLAMIDE SODIUM 500 MG IJ SOLR
500.0000 mg | Freq: Four times a day (QID) | INTRAMUSCULAR | Status: AC
  Administered 2016-06-01 (×3): 500 mg via INTRAVENOUS
  Filled 2016-06-01 (×3): qty 500

## 2016-06-01 MED ORDER — POTASSIUM CHLORIDE 20 MEQ/15ML (10%) PO SOLN
40.0000 meq | Freq: Four times a day (QID) | ORAL | Status: AC
  Administered 2016-06-01 (×3): 40 meq
  Filled 2016-06-01 (×3): qty 30

## 2016-06-01 MED ORDER — THROMBI-PAD 3"X3" EX PADS
1.0000 | MEDICATED_PAD | Freq: Once | CUTANEOUS | Status: DC
Start: 2016-06-01 — End: 2016-06-13
  Filled 2016-06-01: qty 1

## 2016-06-01 MED ORDER — ENOXAPARIN SODIUM 100 MG/ML ~~LOC~~ SOLN
85.0000 mg | SUBCUTANEOUS | Status: DC
  Administered 2016-06-01 – 2016-06-02 (×2): 85 mg via SUBCUTANEOUS
  Filled 2016-06-01 (×2): qty 1

## 2016-06-01 MED ORDER — MAGNESIUM SULFATE 2 GM/50ML IV SOLN
2.0000 g | Freq: Once | INTRAVENOUS | Status: AC
  Administered 2016-06-01: 2 g via INTRAVENOUS
  Filled 2016-06-01: qty 50

## 2016-06-01 NOTE — Progress Notes (Signed)
[place] Progress Note Patient Name: [name] [name] DOB: Mar 04, 1981 MRN: [id number]   Date of Service  06/01/2016  HPI/Events of Note  ABG on 50%/PRVC 24/TV 420/P 5 = 7.46/70/86/50.3.  eICU Interventions  Will order: 1. Decrease PRVC rate to 21. 2. ABG at 7 AM.     Intervention Category Major Interventions: Acid-Base disturbance - evaluation and management;Respiratory failure - evaluation and management  Daisie Haft [place] 06/01/2016, 3:18 AM

## 2016-06-01 NOTE — Progress Notes (Signed)
Critical ABG results called to Dr. Oletta Darter. Vent orders and repeat ABG received.

## 2016-06-01 NOTE — Procedures (Signed)
Central Venous Catheter Insertion Procedure Note [name] [name] [id number] [birth year]  Procedure: Insertion of Central Venous Catheter Indications: Assessment of intravascular volume, Drug and/or fluid administration and Frequent blood sampling  Procedure Details Consent: Risks of procedure as well as the alternatives and risks of each were explained to the (patient/caregiver).  Consent for procedure obtained. Time Out: Verified patient identification, verified procedure, site/side was marked, verified correct patient position, special equipment/implants available, medications/allergies/relevent history reviewed, required imaging and test results available.  Performed Real time [place] was used to ID and cannulate the vessel  Maximum sterile technique was used including antiseptics, cap, gloves, gown, hand hygiene, mask and sheet. Skin prep: Chlorhexidine; local anesthetic administered A antimicrobial bonded/coated triple lumen catheter was placed in the right internal jugular vein using the Seldinger technique.  Evaluation Blood flow good Complications: No apparent complications Patient did tolerate procedure well. Chest X-ray ordered to verify placement.  CXR: pending.  [name] [name] 06/01/2016, 10:08 AM  Erick Colace ACNP-BC [place] Pager # [phone] OR # [phone] if no answer  Rush Farmer, M.D. [facility] Pulmonary/Critical Care Medicine. Pager: [phone]. After hours pager: [phone].

## 2016-06-01 NOTE — Progress Notes (Signed)
PULMONARY / CRITICAL CARE MEDICINE   Name: [name] [name] MRN: [id number] DOB: [birth year]    ADMISSION DATE:  05/25/2016 CONSULTATION DATE:  05/27/16  REFERRING MD:  Dr. Ree Kida - [facility]  CHIEF COMPLAINT:  Altered Mental Status   BRIEF [name] with PMH of morbid obesity (469 lbs), DM, HTN and tachycardia who presented to [facility] on 10/3 with reports of abdominal bloating and heavy abdominal pressure.    On admit, the patient reported she had not taken her blood pressure medications in over one year.  She was profoundly hypertensive on admit as well, initial BP 215/151.  She was found to be hypoxic with saturations in the 70's which improved with 3L O2.  D-Dimer was elevated at 2.59 and a CTA of the chest showed CHF but no PE.  BNP 594.  The patient was admitted per [facility] and treated for acute decompensated CHF.  She was treated with IV diuresis, blood pressure control, afterload reduction and fluid restriction.  ECHO assessment revealed LVEF 50-55%, difficult to assess LV diastolic function, mildly dilated lA, RA, PA peak 42 mm Hg.  On 10/5 afternoon, the patient was found to be altered and difficult to arouse.  Rapid response team was activated.  ABG assessed > 7.16 & pCO2 > 100.  The patient placed on BiPAP and transferred to ICU.  With stimulation of transfer, the patient woke some and was able to interact with staff.    STUDIES:  ECHO 10/4 >> EF 55%, PAP 42.  CULTURES:   ANTIBIOTICS:   SIGNIFICANT EVENTS: 10/3  Admit. 10/5  Transferred to ICU due to AMS, hypercarbic respiratory failure 05/28/16:  RN reports pt remains on bipap, ntg gtt continues & BP remains elevated in 440'H systolic.  Net negative 5.5 L in last 24 hours with improvement in sr cr  05/29/16 - intubated overnight . Not on presssors. Being diursed. Per RN - cards told her that patient is 7# down in fluids overnight. SBP @ 140s. On labetalol - vagaled with suction - hr drpped to 50s x 2 despite being off diprivan. She is on  labetalol and imdur - rn uanble to crush imdur   SUBJECTIVE/OVERNIGHT/INTERVAL HX 10/8 - 13L negative since admit 05/25/16. Weight from 211kg  -> 186 kg. Possible low grade fever developing. Improved pc02. Aggressive lasix cntinues. Follows commands off sedation diprivan gtt. No more vagal episodes after labetalol decrease. Still needing 60% fio2  VITAL SIGNS: BP 133/72 (BP Location: Left Arm)   Pulse 87   Temp 99.5 F (37.5 C) (Oral)   Resp (!) 24   Ht _0  (1.6 m)   Wt (!) 169.2 kg (373 lb)   LMP  (LMP Unknown)   SpO2 100%   BMI 66.07 kg/m   HEMODYNAMICS:    VENTILATOR SETTINGS: Vent Mode: PRVC FiO2 (%):  [50 %] 50 % Set Rate:  [21 bmp-24 bmp] 21 bmp Vt Set:  [420 mL] 420 mL PEEP:  [5 cmH20] 5 cmH20 Plateau Pressure:  [23 cmH20-29 cmH20] 24 cmH20  INTAKE / OUTPUT: I/O last 3 completed shifts: In: 2891.5 [I.V.:1691.5; NG/GT:650; IV Piggyback:550] Out: 47425 [Urine:13250]  PHYSICAL EXAMINATION: General:  Morbidly obese female intubated and sedated, easily arousable.  Neuro:  Arousable, moving all ext to command, gaging. HEENT:  MM pink/moist, unable to assess JVD  Cardiovascular:  RRR, Nl S1/S2, -M/R/G, difficult to fully auscultate  Lungs:  Shallow, non-labored, lungs bilaterally diminished with diffuse crackles Abdomen:  Obese, soft, bsx4 active  Musculoskeletal:  No  acute deformities  Skin:  Warm/dry, generalized edema  LABS: PULMONARY  Recent Labs Lab 05/28/16 0835 05/28/16 1735 05/28/16 1940 05/28/16 2345 06/01/16 0305  PHART 7.303* 7.264* 7.260* 7.373 7.464*  PCO2ART 89.8* 102* 105* 76.6* 70.2*  PO2ART 110.0* 89.4 84.1 78.3* 86.0  HCO3 43.2* 44.5* 45.8* 43.7* 50.3*  TCO2  --   --   --   --  >50  O2SAT 97.6 95.3 95.1 95.2 96.0   CBC  Recent Labs Lab 05/29/16 0212 05/31/16 0313 06/01/16 0554  HGB 12.2 12.7 12.9  HCT 42.6 43.7 45.1  WBC 3.6* 4.7 4.5  PLT 158 177 PENDING   COAGULATION  Recent Labs Lab 05/26/16 0435  INR 1.28    CARDIAC    Recent Labs Lab 05/25/16 2305 05/26/16 0435 05/26/16 0946  TROPONINI 0.06* 0.10* 0.06*   No results for input(s): PROBNP in the last 168 hours.  CHEMISTRY  Recent Labs Lab 05/27/16 1636 05/28/16 0205 05/29/16 0212 05/30/16 1639 05/31/16 0313 05/31/16 2030 06/01/16 0554  NA 139 141 142 143 143  --  143  K 5.1 4.5 4.0 3.0* 3.2*  --  4.0  CL 97* 95* 93* 88* 83*  --  88*  CO2 35* 36* 41* 45* 45*  --  44*  GLUCOSE 121* 91 73 79 77  --  116*  BUN 31* 29* 25* 17 13  --  15  CREATININE 1.35* 1.04* 0.97 0.99 0.98  --  0.98  CALCIUM 8.5* 8.8* 8.7* 8.3* 8.1*  --  8.4*  MG 1.9  --   --   --  1.2* 2.0 1.9  PHOS 5.6*  --   --   --  3.6  --  4.5   Estimated Creatinine Clearance: 124.2 mL/min (by C-G formula based on SCr of 0.98 mg/dL).  LIVER  Recent Labs Lab 05/25/16 1240 05/26/16 0435  AST 34  --   ALT 24  --   ALKPHOS 102  --   BILITOT 2.3*  --   PROT 7.5  --   ALBUMIN 2.8*  --   INR  --  1.28   INFECTIOUS  Recent Labs Lab 05/27/16 1636  LATICACIDVEN 1.7     ENDOCRINE CBG (last 3)   Recent Labs  05/31/16 2323 06/01/16 0358 06/01/16 0717  GLUCAP 115* 103* 123*         IMAGING x48h  - image(s) personally visualized  -   highlighted in bold Dg Chest Port 1 View  Result Date: 06/01/2016 CLINICAL DATA:  Malignant hypertension, CHF, pulmonary edema, morbid obesity. EXAM: PORTABLE CHEST 1 VIEW COMPARISON:  Portable chest x-ray of May 31, 2016 FINDINGS: The lungs remain hypoinflated. The interstitial markings remain increased. The pulmonary vascularity remains engorged. The cardiac silhouette is markedly enlarged. The hemidiaphragms remain obscured. The endotracheal tube tip lies approximately 3.5 cm above the carina. The esophagogastric tube tip projects below the inferior margin of the image. IMPRESSION: CHF with pulmonary interstitial and alveolar edema. Probable small bilateral pleural effusions. Basilar pneumonia is not excluded.  Allowing for differences in positioning there has not been significant interval change. The support tubes are in reasonable position. Electronically Signed   By: David  Martinique M.D.   On: 06/01/2016 07:35   Dg Chest Port 1 View  Result Date: 05/31/2016 CLINICAL DATA:  ET tube placement EXAM: PORTABLE CHEST 1 VIEW COMPARISON:  05/30/2016 FINDINGS: Endotracheal tube with the tip 2.5 cm above the carina. Nasogastric tube coursing below the diaphragm. Bilateral interstitial and alveolar airspace opacities  with enlargement of the central pulmonary vasculature. No pneumothorax. Trace bilateral pleural effusions. Stable cardiomegaly. No acute osseous abnormality. IMPRESSION: 1. Endotracheal tube with the tip 2.5 cm above the carina. 2. Bilateral interstitial and alveolar airspace opacities with cardiomegaly compatible with CHF versus multilobar pneumonia. Electronically Signed   By: Kathreen Devoid   On: 05/31/2016 07:03       DISCUSSION: [name] with PMH of super morbid obesity, HTN, pulmonary hypertension admitted with decompensated CHF.  Decompensated 10/5 with hypercarbic respiratory failure.      PULMONARY A: Acute on Chronic Hypoxemic and Hypercarbic Respiratory Failure. Secondary pulmonary hypertension. Severe OSA / OHS with acute Decompensation - sleep study in 2012 with up to 75 events per hour with desaturation to 72%  P:   Full vent support Continue aggressive diuresis as BP and SCr permit. Pulmonary hygiene. O2 as needed to support sats > 92% Will need ambulatory saturation assessment prior to discharge CXR intermittently.  CARDIOVASCULAR A:  Hypertensive Urgency. Acute diastolic heart failure. Elevated troponin - due to demand ischemia. Hx HTN.  P:  Hydralazine 50 mg QD  Labetalol 200 mg TID PRN Hydralazine for SBP > 150 (with holding parameters for SBP < 110). Goal SBP < 150 per RN Cardiology following, appreciate the assistance. Continue aggressive diuresis as BP and  SCr permit.  RENAL A:   AKI - improving P:   KVO fluids. Correct electrolytes as indicated. Trend BMP / UOP Lasix 80 mg IV q6. Increase Diamox 500 q6 x3 doses KCl PO.  GASTROINTESTINAL A:   Morbid obesity. Nutrition. P:   NPO. TF per nutrition. Pt will need outpatient referral to Dr. Hassell Done for consideration of bypass surgery  HEMATOLOGIC A:   VTE prophylaxis. P:  SCD's / Lovenox for DVT prophylaxis CBC in AM.  INFECTIOUS A:   No indication of infection. P:   Monitor clinically.  ENDOCRINE A:   No acute issues. P:   No interventions required.  NEUROLOGIC A:   Acute encephalopathy - in the setting of hypercarbia. Depression - family reports pt is in "denial" - seen by Psych 05/28/16  P:   Monitor clinically. Limit sedating meds. Follow ABG intermittently   REPRODUCTIVE A: R/o pregnancy - had "abd bloating" x 1 week upon presentation 10/3, negative P: No acute interventions  FAMILY  - Updates: No family bedside 10/10  - Inter-disciplinary family meet or Palliative Care meeting due by: 10/12  The patient is critically ill with multiple organ systems failure and requires high complexity decision making for assessment and support, frequent evaluation and titration of therapies, application of advanced monitoring technologies and extensive interpretation of multiple databases.   Critical Care Time devoted to patient care services described in this note is  35  Minutes. This time reflects time of care of this signee Dr Jennet Maduro. This critical care time does not reflect procedure time, or teaching time or supervisory time of PA/NP/Med student/Med Resident etc but could involve care discussion time.  Rush Farmer, M.D. [facility] Pulmonary/Critical Care Medicine. Pager: [phone]. After hours pager: [phone].  06/01/2016 8:46 AM

## 2016-06-01 NOTE — Procedures (Signed)
Central Venous Catheter Insertion Procedure Note [name] [name] [id number] 01-28-1981  Procedure: Insertion of Central Venous Catheter Indications: Drug and/or fluid administration  Procedure Details Consent: Risks of procedure as well as the alternatives and risks of each were explained to the (patient/caregiver).  Consent for procedure obtained. Time Out: Verified patient identification, verified procedure, site/side was marked, verified correct patient position, special equipment/implants available, medications/allergies/relevent history reviewed, required imaging and test results available.  Performed  Maximum sterile technique was used including antiseptics, cap, gloves, gown, hand hygiene, mask and sheet. Skin prep: Chlorhexidine; local anesthetic administered A antimicrobial bonded/coated triple lumen catheter was placed in the right internal jugular vein using the Seldinger technique.  Evaluation Blood flow good Complications: No apparent complications Patient did tolerate procedure well. Chest X-ray ordered to verify placement.  CXR: pending.  [name] [name] 06/01/2016, 10:04 AM

## 2016-06-01 NOTE — Progress Notes (Signed)
PT Cancellation Note  Patient Details Name: [name] [name] MRN: [id number] DOB: 1981-05-23   Cancelled Treatment:    Reason Eval/Treat Not Completed: Medical issues which prohibited therapy   Per RN, pt is sedated and not following commands. Will continue to monitor ability to participate with therapy   Hartley Urton 06/01/2016, 11:14 AM Pager [phone]

## 2016-06-01 NOTE — Progress Notes (Signed)
Chart reviewed. NCM will continue to follow for dc needs. Jonnie Finner RN CCM Case Mgmt phone [phone]

## 2016-06-02 ENCOUNTER — Inpatient Hospital Stay (HOSPITAL_COMMUNITY): Payer: PRIVATE HEALTH INSURANCE

## 2016-06-02 DIAGNOSIS — I5031 Acute diastolic (congestive) heart failure: Secondary | ICD-10-CM

## 2016-06-02 LAB — CBC WITH DIFFERENTIAL/PLATELET
Basophils Absolute: 0 10*3/uL (ref 0.0–0.1)
Basophils Relative: 0 %
Eosinophils Absolute: 0.5 10*3/uL (ref 0.0–0.7)
Eosinophils Relative: 11 %
HCT: 44.4 % (ref 36.0–46.0)
Hemoglobin: 12.5 g/dL (ref 12.0–15.0)
Lymphocytes Relative: 19 %
Lymphs Abs: 0.9 10*3/uL (ref 0.7–4.0)
MCH: 23.9 pg — ABNORMAL LOW (ref 26.0–34.0)
MCHC: 28.2 g/dL — ABNORMAL LOW (ref 30.0–36.0)
MCV: 84.7 fL (ref 78.0–100.0)
Monocytes Absolute: 0.7 10*3/uL (ref 0.1–1.0)
Monocytes Relative: 16 %
Neutro Abs: 2.4 10*3/uL (ref 1.7–7.7)
Neutrophils Relative %: 53 %
Platelets: 192 10*3/uL (ref 150–400)
RBC: 5.24 MIL/uL — ABNORMAL HIGH (ref 3.87–5.11)
RDW: 21.2 % — ABNORMAL HIGH (ref 11.5–15.5)
WBC: 4.5 10*3/uL (ref 4.0–10.5)

## 2016-06-02 LAB — GLUCOSE, CAPILLARY
Glucose-Capillary: 92 mg/dL (ref 65–99)
Glucose-Capillary: 105 mg/dL — ABNORMAL HIGH (ref 65–99)
Glucose-Capillary: 110 mg/dL — ABNORMAL HIGH (ref 65–99)
Glucose-Capillary: 106 mg/dL — ABNORMAL HIGH (ref 65–99)
Glucose-Capillary: 122 mg/dL — ABNORMAL HIGH (ref 65–99)
Glucose-Capillary: 99 mg/dL (ref 65–99)

## 2016-06-02 LAB — BLOOD GAS, ARTERIAL
Acid-Base Excess: 20.8 mmol/L — ABNORMAL HIGH (ref 0.0–2.0)
Bicarbonate: 47.5 mmol/L — ABNORMAL HIGH (ref 20.0–28.0)
Drawn by: 437071
FIO2: 50
MECHVT: 420 mL
O2 Saturation: 94.8 %
PEEP: 5 cmH2O
Patient temperature: 97.4
RATE: 21 resp/min
pCO2 arterial: 83.2 mmHg (ref 32.0–48.0)
pH, Arterial: 7.371 (ref 7.350–7.450)
pO2, Arterial: 78.4 mmHg — ABNORMAL LOW (ref 83.0–108.0)

## 2016-06-02 LAB — BASIC METABOLIC PANEL
Anion gap: 12 (ref 5–15)
BUN: 27 mg/dL — ABNORMAL HIGH (ref 6–20)
CO2: 41 mmol/L — ABNORMAL HIGH (ref 22–32)
Calcium: 8.5 mg/dL — ABNORMAL LOW (ref 8.9–10.3)
Chloride: 93 mmol/L — ABNORMAL LOW (ref 101–111)
Creatinine, Ser: 1.08 mg/dL — ABNORMAL HIGH (ref 0.44–1.00)
GFR calc Af Amer: >60 mL/min (ref >60)
GFR calc non Af Amer: >60 mL/min (ref >60)
Glucose, Bld: 86 mg/dL (ref 65–99)
Potassium: 4 mmol/L (ref 3.5–5.1)
Sodium: 146 mmol/L — ABNORMAL HIGH (ref 135–145)

## 2016-06-02 LAB — PHOSPHORUS: Phosphorus: 3.1 mg/dL (ref 2.5–4.6)

## 2016-06-02 LAB — MAGNESIUM: Magnesium: 2.1 mg/dL (ref 1.7–2.4)

## 2016-06-02 MED ORDER — PRO-STAT SUGAR FREE PO LIQD
60.0000 mL | Freq: Three times a day (TID) | ORAL | Status: DC
  Administered 2016-06-02 – 2016-06-17 (×40): 60 mL
  Filled 2016-06-02 (×40): qty 60

## 2016-06-02 MED ORDER — VITAL HIGH PROTEIN PO LIQD
1000.0000 mL | ORAL | Status: DC
  Administered 2016-06-02 – 2016-06-08 (×7): 1000 mL
  Administered 2016-06-08: 08:00:00
  Administered 2016-06-09 – 2016-06-12 (×3): 1000 mL
  Administered 2016-06-12 – 2016-06-13 (×2)
  Administered 2016-06-13: 1000 mL
  Administered 2016-06-13 – 2016-06-14 (×5)
  Administered 2016-06-14 – 2016-06-16 (×4): 1000 mL
  Filled 2016-06-02 (×3): qty 1000

## 2016-06-02 MED ORDER — POTASSIUM CHLORIDE 20 MEQ/15ML (10%) PO SOLN
40.0000 meq | Freq: Three times a day (TID) | ORAL | Status: AC
  Administered 2016-06-02 (×2): 40 meq
  Filled 2016-06-02 (×2): qty 30

## 2016-06-02 MED ORDER — ACETAMINOPHEN 160 MG/5ML PO SOLN
650.0000 mg | Freq: Four times a day (QID) | ORAL | Status: DC | PRN
  Administered 2016-06-02 – 2016-06-11 (×8): 650 mg
  Filled 2016-06-02 (×8): qty 20.3

## 2016-06-02 MED ORDER — ACETAZOLAMIDE SODIUM 500 MG IJ SOLR
500.0000 mg | Freq: Four times a day (QID) | INTRAMUSCULAR | Status: AC
  Administered 2016-06-02 – 2016-06-03 (×4): 500 mg via INTRAVENOUS
  Filled 2016-06-02 (×4): qty 500

## 2016-06-02 NOTE — Evaluation (Addendum)
Physical Therapy Note Patient Details Name: [name] [name] MRN: [id number] DOB: 21-Aug-1981 Today's Date: 06/02/2016   History of Present Illness  [name] with PMH of morbid obesity (469 lbs), DM, HTN and tachycardia who presented to [facility] on 10/3 with reports of abdominal bloating and heavy abdominal pressure.  Intubated 05/29/16 to present.   Clinical Impression  Pt admitted with above diagnosis. Pt currently with functional limitations due to weakness and immobility due to pt on vent and sedated. Pt performed UE and LE exercises with PT.  Pt did well overall. Needed repetitive cuing due to lethargy.  Will progess pt as respiratory status allows.  Pt will benefit from skilled PT to increase their independence and safety with mobility to allow discharge to the venue listed below.      Follow Up Recommendations SNF;Supervision/Assistance - 24 hour    Equipment Recommendations  Rolling walker with 5" wheels;3in1 (PT)    Recommendations for Other Services       Precautions / Restrictions Precautions Precautions: Fall Restrictions Weight Bearing Restrictions: No      Mobility  Bed Mobility               General bed mobility comments: Pt in bed on vent.  Nursing agreed that pt could perform exercises with PT.   Transfers                    Ambulation/Gait                Stairs            Wheelchair Mobility    Modified Rankin (Stroke Patients Only)       Balance                                             Pertinent Vitals/Pain Pain Assessment: No/denies pain  92bpm, 98% O2 sats, BP 133/74 to 122/65 at end of treatment.  RR 24, Pt vent settings FiO2 45% with PEEP of 5.    Home Living                        Prior Function                 Hand Dominance        Extremity/Trunk Assessment                         Communication      Cognition Arousal/Alertness: Lethargic;Suspect due to  medications Behavior During Therapy: Flat affect (pt at times not responding and kept closing eyes) Overall Cognitive Status: Impaired/Different from baseline Area of Impairment: Orientation;Following commands;Safety/judgement;Awareness;Problem solving Orientation Level: Disoriented to;Situation;Time;Place   Memory: Decreased recall of precautions;Decreased short-term memory Following Commands: Follows one step commands with increased time;Follows one step commands inconsistently Safety/Judgement: Decreased awareness of safety   Problem Solving: Slow processing;Decreased initiation;Difficulty sequencing;Requires verbal cues;Requires tactile cues      General Comments      Exercises General Exercises - Upper Extremity Shoulder Flexion: AROM;Both;10 reps;Supine Shoulder ABduction: AROM;Left;5 reps;Supine Elbow Flexion: AROM;Both;10 reps;Supine Wrist Flexion: AROM;Both;Supine General Exercises - Lower Extremity Ankle Circles/Pumps: AROM;Both;5 reps;Supine Quad Sets: AROM;Both;10 reps;Supine Gluteal Sets: AROM;Both;5 reps;Supine Heel Slides: AROM;Right;5 reps;Supine   Assessment/Plan    PT Assessment    PT Problem List  PT Treatment Interventions      PT Goals (Current goals can be found in the Care Plan section)  Acute Rehab PT Goals Patient Stated Goal: unable to state    Frequency Min 3X/week   Barriers to discharge        Co-evaluation               End of Session Equipment Utilized During Treatment: Gait belt;Oxygen (on vent) Activity Tolerance: Patient limited by fatigue Patient left: with call bell/phone within reach;with family/visitor present;in bed;with bed alarm set Nurse Communication: Mobility status;Need for lift equipment         Time: 1761-6073 PT Time Calculation (min) (ACUTE ONLY): 18 min   Charges:     PT Treatments $Therapeutic Exercise: 8-22 mins   PT G CodesDenice Paradise Jul 01, 2016, 11:44 AM Amanda Cockayne Acute Rehabilitation [phone] (pager)

## 2016-06-02 NOTE — Progress Notes (Signed)
PULMONARY / CRITICAL CARE MEDICINE   Name: [name] [name] MRN: [id number] DOB: May 14, 1981    ADMISSION DATE:  05/25/2016 CONSULTATION DATE:  05/27/16  REFERRING MD:  Dr. Ree Kida - [facility]  CHIEF COMPLAINT:  Altered Mental Status   BRIEF [name] with PMH of morbid obesity (469 lbs), DM, HTN and tachycardia who presented to [facility] on 10/3 with reports of abdominal bloating and heavy abdominal pressure.    On admit, the patient reported she had not taken her blood pressure medications in over one year.  She was profoundly hypertensive on admit as well, initial BP 215/151.  She was found to be hypoxic with saturations in the 70's which improved with 3L O2.  D-Dimer was elevated at 2.59 and a CTA of the chest showed CHF but no PE.  BNP 594.  The patient was admitted per [facility] and treated for acute decompensated CHF.  She was treated with IV diuresis, blood pressure control, afterload reduction and fluid restriction.  ECHO assessment revealed LVEF 50-55%, difficult to assess LV diastolic function, mildly dilated lA, RA, PA peak 42 mm Hg.  On 10/5 afternoon, the patient was found to be altered and difficult to arouse.  Rapid response team was activated.  ABG assessed > 7.16 & pCO2 > 100.  The patient placed on BiPAP and transferred to ICU.  With stimulation of transfer, the patient woke some and was able to interact with staff.    STUDIES:  ECHO 10/4 >> EF 55%, PAP 42.  CULTURES:   ANTIBIOTICS:   SIGNIFICANT EVENTS: 10/3  Admit. 10/5  Transferred to ICU due to AMS, hypercarbic respiratory failure 05/28/16:  RN reports pt remains on bipap, ntg gtt continues & BP remains elevated in 595'G systolic.  Net negative 5.5 L in last 24 hours with improvement in sr cr  05/29/16 - intubated overnight . Not on presssors. Being diursed. Per RN - cards told her that patient is 7# down in fluids overnight. SBP @ 140s. On labetalol - vagaled with suction - hr drpped to 50s x 2 despite being off diprivan. She is on  labetalol and imdur - rn uanble to crush imdur   SUBJECTIVE/OVERNIGHT/INTERVAL HX 10/8 - 13L negative since admit 05/25/16. Weight from 211kg  -> 186 kg. Possible low grade fever developing. Improved pc02. Aggressive lasix cntinues. Follows commands off sedation diprivan gtt. No more vagal episodes after labetalol decrease. Still needing 60% fio2  VITAL SIGNS: BP 131/76 (BP Location: Left Wrist)   Pulse 95   Temp (!) 100.9 F (38.3 C) (Oral)   Resp (!) 21   Ht _0  (1.6 m)   Wt (!) 167.8 kg (370 lb)   LMP  (LMP Unknown)   SpO2 96%   BMI 65.54 kg/m   HEMODYNAMICS: CVP:  [12 mmHg-20 mmHg] 17 mmHg  VENTILATOR SETTINGS: Vent Mode: PRVC FiO2 (%):  [45 %-50 %] 45 % Set Rate:  [21 bmp] 21 bmp Vt Set:  [420 mL] 420 mL PEEP:  [5 cmH20] 5 cmH20 Plateau Pressure:  [25 cmH20-30 cmH20] 28 cmH20  INTAKE / OUTPUT: I/O last 3 completed shifts: In: 1579.2 [I.V.:659.2; NG/GT:770; IV Piggyback:150] Out: 10265 [Urine:10265]  PHYSICAL EXAMINATION: General:  Morbidly obese female intubated and sedated, easily arousable.  Neuro:  Arousable, moving all ext to command, gaging. HEENT:  MM pink/moist, unable to assess JVD  Cardiovascular:  RRR, Nl S1/S2, -M/R/G, difficult to fully auscultate  Lungs:  Shallow, non-labored, lungs bilaterally diminished with diffuse crackles Abdomen:  Obese, soft,  bsx4 active  Musculoskeletal:  No acute deformities  Skin:  Warm/dry, generalized edema  LABS: PULMONARY  Recent Labs Lab 05/28/16 1940 05/28/16 2345 06/01/16 0305 06/01/16 0900 06/02/16 0325  PHART 7.260* 7.373 7.464* 7.389 7.371  PCO2ART 105* 76.6* 70.2* 81.8* 83.2*  PO2ART 84.1 78.3* 86.0 86.6 78.4*  HCO3 45.8* 43.7* 50.3* 48.1* 47.5*  TCO2  --   --  >50  --   --   O2SAT 95.1 95.2 96.0 94.4 94.8   CBC  Recent Labs Lab 05/31/16 0313 06/01/16 0554 06/02/16 0423  HGB 12.7 12.9 12.5  HCT 43.7 45.1 44.4  WBC 4.7 4.5 4.5  PLT 177 183 192   COAGULATION No results for input(s): INR  in the last 168 hours. CARDIAC    Recent Labs Lab 05/26/16 0946  TROPONINI 0.06*   No results for input(s): PROBNP in the last 168 hours.  CHEMISTRY  Recent Labs Lab 05/27/16 1636  05/29/16 0212 05/30/16 1639 05/31/16 0313 05/31/16 2030 06/01/16 0554 06/02/16 0423  NA 139  < > 142 143 143  --  143 146*  K 5.1  < > 4.0 3.0* 3.2*  --  4.0 4.0  CL 97*  < > 93* 88* 83*  --  88* 93*  CO2 35*  < > 41* 45* 45*  --  44* 41*  GLUCOSE 121*  < > 73 79 77  --  116* 86  BUN 31*  < > 25* 17 13  --  15 27*  CREATININE 1.35*  < > 0.97 0.99 0.98  --  0.98 1.08*  CALCIUM 8.5*  < > 8.7* 8.3* 8.1*  --  8.4* 8.5*  MG 1.9  --   --   --  1.2* 2.0 1.9 2.1  PHOS 5.6*  --   --   --  3.6  --  4.5 3.1  < > = values in this interval not displayed. Estimated Creatinine Clearance: 112.1 mL/min (by C-G formula based on SCr of 1.08 mg/dL (H)).  LIVER No results for input(s): AST, ALT, ALKPHOS, BILITOT, PROT, ALBUMIN, INR in the last 168 hours. INFECTIOUS  Recent Labs Lab 05/27/16 1636  LATICACIDVEN 1.7     ENDOCRINE CBG (last 3)   Recent Labs  06/02/16 0000 06/02/16 0357 06/02/16 0731  GLUCAP 107* 92 105*         IMAGING x48h  - image(s) personally visualized  -   highlighted in bold Dg Chest Port 1 View  Result Date: 06/02/2016 CLINICAL DATA:  intubated EXAM: PORTABLE CHEST 1 VIEW COMPARISON:  06/01/2016 FINDINGS: Cardiomegaly again noted. Central vascular congestion and diffuse airspace disease bilaterally again noted consistent with pulmonary edema. Stable endotracheal and NG tube position. There is right IJ central line with tip in right atrium. No pneumothorax. IMPRESSION: Central vascular congestion and diffuse airspace disease bilaterally again noted consistent with pulmonary edema. Stable endotracheal and NG tube position. There is right IJ central line with tip in right atrium. No pneumothorax. Electronically Signed   By: Lahoma Crocker M.D.   On: 06/02/2016 07:57   Dg Chest  Port 1 View  Result Date: 06/01/2016 CLINICAL DATA:  Central line insertion EXAM: PORTABLE CHEST 1 VIEW COMPARISON:  06/01/2016 FINDINGS: Endotracheal tube and NG tube are unchanged. Right central line is been placed with the tip at the cavoatrial junction. No pneumothorax. There is cardiomegaly with diffuse bilateral airspace disease, likely edema/ CHF. Low lung volumes with bibasilar atelectasis. No visible effusions. IMPRESSION: Right central line tip at the  cavoatrial junction.  No pneumothorax. Mild edema/ CHF.  Low lung volumes. Electronically Signed   By: Rolm Baptise M.D.   On: 06/01/2016 10:06   Dg Chest Port 1 View  Result Date: 06/01/2016 CLINICAL DATA:  Malignant hypertension, CHF, pulmonary edema, morbid obesity. EXAM: PORTABLE CHEST 1 VIEW COMPARISON:  Portable chest x-ray of May 31, 2016 FINDINGS: The lungs remain hypoinflated. The interstitial markings remain increased. The pulmonary vascularity remains engorged. The cardiac silhouette is markedly enlarged. The hemidiaphragms remain obscured. The endotracheal tube tip lies approximately 3.5 cm above the carina. The esophagogastric tube tip projects below the inferior margin of the image. IMPRESSION: CHF with pulmonary interstitial and alveolar edema. Probable small bilateral pleural effusions. Basilar pneumonia is not excluded. Allowing for differences in positioning there has not been significant interval change. The support tubes are in reasonable position. Electronically Signed   By: David  Martinique M.D.   On: 06/01/2016 07:35   DISCUSSION: [name] with PMH of super morbid obesity, HTN, pulmonary hypertension admitted with decompensated CHF.  Decompensated 10/5 with hypercarbic respiratory failure.     PULMONARY A: Acute on Chronic Hypoxemic and Hypercarbic Respiratory Failure. Secondary pulmonary hypertension. Severe OSA / OHS with acute Decompensation - sleep study in 2012 with up to 75 events per hour with desaturation to  72%  P:   Full vent support Continue aggressive diuresis as BP and SCr permit. Pulmonary hygiene. O2 as needed to support sats > 92% Will need ambulatory saturation assessment prior to discharge CXR intermittently.  CARDIOVASCULAR A:  Hypertensive Urgency. Acute diastolic heart failure. Elevated troponin - due to demand ischemia. Hx HTN.  P:  Hydralazine 50 mg QD  Labetalol 200 mg TID PRN Hydralazine for SBP > 150 (with holding parameters for SBP < 110). Goal SBP < 150 per RN Cardiology following, appreciate the assistance. Continue aggressive diuresis as BP and SCr permit.  RENAL A:   AKI - improving P:   KVO fluids. Correct electrolytes as indicated. Trend BMP / UOP Lasix 80 mg IV q6. Increase Diamox 500 q6 x4 doses KCl PO.  GASTROINTESTINAL A:   Morbid obesity. Nutrition. P:   NPO. TF per nutrition. Pt will need outpatient referral to Dr. Hassell Done for consideration of bypass surgery  HEMATOLOGIC A:   VTE prophylaxis. P:  SCD's / Lovenox for DVT prophylaxis CBC in AM.  INFECTIOUS A:   No indication of infection. P:   Monitor clinically.  ENDOCRINE A:   No acute issues. P:   No interventions required.  NEUROLOGIC A:   Acute encephalopathy - in the setting of hypercarbia. Depression - family reports pt is in "denial" - seen by Psych 05/28/16  P:   Monitor clinically. Limit sedating meds. Follow ABG intermittently   REPRODUCTIVE A: R/o pregnancy - had "abd bloating" x 1 week upon presentation 10/3, negative P: No acute interventions  FAMILY  - Updates: No family bedside 10/11  - Inter-disciplinary family meet or Palliative Care meeting due by: 10/12  The patient is critically ill with multiple organ systems failure and requires high complexity decision making for assessment and support, frequent evaluation and titration of therapies, application of advanced monitoring technologies and extensive interpretation of multiple  databases.   Critical Care Time devoted to patient care services described in this note is  35  Minutes. This time reflects time of care of this signee Dr Jennet Maduro. This critical care time does not reflect procedure time, or teaching time or supervisory time of PA/NP/Med  student/Med Resident etc but could involve care discussion time.  Rush Farmer, M.D. [facility] Pulmonary/Critical Care Medicine. Pager: [phone]. After hours pager: [phone].  06/02/2016 9:01 AM

## 2016-06-02 NOTE — Progress Notes (Signed)
Nutrition Follow-up  DOCUMENTATION CODES:   Morbid obesity  INTERVENTION:   Increase Vital High Protein to 25 ml/hr (600 ml/day) Decrease Prostat to 60 ml TID Provides: 1200 kcal, 142 grams protein, and 501 ml H2O  NUTRITION DIAGNOSIS:   Inadequate oral intake related to inability to eat as evidenced by NPO status. Ongoing  GOAL:   Provide needs based on ASPEN/SCCM guidelines Met  MONITOR:   Vent status, Labs, Weight trends, Skin, I & O's  ASSESSMENT:   [name] with PMH of morbid obesity (469 lbs), DM, HTN and tachycardia who presented to [facility] on 10/3 with reports of abdominal bloating and heavy abdominal pressure.    Patient is currently intubated on ventilator support Weight is decreasing, pt is now -33L Propofol:off Medications reviewed and include: miralax, KCl Labs reviewed: Na 146 OG tube  Diet Order:  Diet NPO time specified  Skin:  Wound (see comment) (wound on R and L tibial)  Last BM:  10/6  Height:   Ht Readings from Last 1 Encounters:  06/01/16 _0  (1.6 m)    Weight:   Wt Readings from Last 1 Encounters:  06/02/16 (!) 370 lb (167.8 kg)    Ideal Body Weight:  52.27 kg  BMI:  Body mass index is 65.54 kg/m.  Estimated Nutritional Needs:   Kcal:  1150-1307  Protein:  >/= 130 grams  Fluid:  Per MD  EDUCATION NEEDS:   Education needs no appropriate at this time  [place], [place], [place] Pager [phone] After Hours Pager

## 2016-06-03 ENCOUNTER — Inpatient Hospital Stay (HOSPITAL_COMMUNITY): Payer: PRIVATE HEALTH INSURANCE

## 2016-06-03 LAB — GLUCOSE, CAPILLARY
Glucose-Capillary: 96 mg/dL (ref 65–99)
Glucose-Capillary: 123 mg/dL — ABNORMAL HIGH (ref 65–99)
Glucose-Capillary: 131 mg/dL — ABNORMAL HIGH (ref 65–99)
Glucose-Capillary: 161 mg/dL — ABNORMAL HIGH (ref 65–99)
Glucose-Capillary: 109 mg/dL — ABNORMAL HIGH (ref 65–99)

## 2016-06-03 LAB — BASIC METABOLIC PANEL
Anion gap: 7 (ref 5–15)
BUN: 40 mg/dL — ABNORMAL HIGH (ref 6–20)
CO2: 44 mmol/L — ABNORMAL HIGH (ref 22–32)
Calcium: 8.8 mg/dL — ABNORMAL LOW (ref 8.9–10.3)
Chloride: 97 mmol/L — ABNORMAL LOW (ref 101–111)
Creatinine, Ser: 1.16 mg/dL — ABNORMAL HIGH (ref 0.44–1.00)
GFR calc Af Amer: >60 mL/min (ref >60)
GFR calc non Af Amer: 60 mL/min — ABNORMAL LOW (ref >60)
Glucose, Bld: 93 mg/dL (ref 65–99)
Potassium: 3.6 mmol/L (ref 3.5–5.1)
Sodium: 148 mmol/L — ABNORMAL HIGH (ref 135–145)

## 2016-06-03 LAB — CBC WITH DIFFERENTIAL/PLATELET
Basophils Absolute: 0.1 10*3/uL (ref 0.0–0.1)
Basophils Relative: 1 %
Eosinophils Absolute: 0.5 10*3/uL (ref 0.0–0.7)
Eosinophils Relative: 9 %
HCT: 45 % (ref 36.0–46.0)
Hemoglobin: 12.6 g/dL (ref 12.0–15.0)
Lymphocytes Relative: 19 %
Lymphs Abs: 1.1 10*3/uL (ref 0.7–4.0)
MCH: 24.1 pg — ABNORMAL LOW (ref 26.0–34.0)
MCHC: 28 g/dL — ABNORMAL LOW (ref 30.0–36.0)
MCV: 86 fL (ref 78.0–100.0)
Monocytes Absolute: 1 10*3/uL (ref 0.1–1.0)
Monocytes Relative: 17 %
Neutro Abs: 3.1 10*3/uL (ref 1.7–7.7)
Neutrophils Relative %: 54 %
Platelets: 213 10*3/uL (ref 150–400)
RBC: 5.23 MIL/uL — ABNORMAL HIGH (ref 3.87–5.11)
RDW: 21.5 % — ABNORMAL HIGH (ref 11.5–15.5)
WBC: 5.8 10*3/uL (ref 4.0–10.5)

## 2016-06-03 LAB — BLOOD GAS, ARTERIAL
Acid-Base Excess: 18.1 mmol/L — ABNORMAL HIGH (ref 0.0–2.0)
Bicarbonate: 44.5 mmol/L — ABNORMAL HIGH (ref 20.0–28.0)
Drawn by: 46203
FIO2: 45
MECHVT: 420 mL
O2 Saturation: 92.3 %
PEEP: 5 cmH2O
Patient temperature: 98.6
RATE: 21 resp/min
pCO2 arterial: 80.3 mmHg (ref 32.0–48.0)
pH, Arterial: 7.362 (ref 7.350–7.450)
pO2, Arterial: 70.3 mmHg — ABNORMAL LOW (ref 83.0–108.0)

## 2016-06-03 LAB — PHOSPHORUS: Phosphorus: 3.2 mg/dL (ref 2.5–4.6)

## 2016-06-03 LAB — MAGNESIUM: Magnesium: 2 mg/dL (ref 1.7–2.4)

## 2016-06-03 MED ORDER — ACETAZOLAMIDE SODIUM 500 MG IJ SOLR
500.0000 mg | Freq: Four times a day (QID) | INTRAMUSCULAR | Status: AC
Start: 2016-06-03 — End: 2016-06-04
  Administered 2016-06-03 – 2016-06-04 (×4): 500 mg via INTRAVENOUS
  Filled 2016-06-03 (×4): qty 500

## 2016-06-03 MED ORDER — MAGNESIUM HYDROXIDE 400 MG/5ML PO SUSP
30.0000 mL | Freq: Every day | ORAL | Status: DC
  Administered 2016-06-04: 30 mL
  Filled 2016-06-03 (×5): qty 30

## 2016-06-03 MED ORDER — POTASSIUM CHLORIDE 20 MEQ/15ML (10%) PO SOLN
20.0000 meq | ORAL | Status: AC
  Administered 2016-06-03 (×2): 20 meq
  Filled 2016-06-03 (×2): qty 15

## 2016-06-03 MED ORDER — POTASSIUM CHLORIDE 20 MEQ/15ML (10%) PO SOLN
ORAL | Status: AC
  Filled 2016-06-03: qty 15

## 2016-06-03 MED ORDER — DOCUSATE SODIUM 50 MG/5ML PO LIQD
100.0000 mg | Freq: Two times a day (BID) | ORAL | Status: DC
Start: 2016-06-03 — End: 2016-06-14
  Administered 2016-06-03 – 2016-06-10 (×9): 100 mg
  Filled 2016-06-03 (×12): qty 10

## 2016-06-03 MED ORDER — ENOXAPARIN SODIUM 80 MG/0.8ML ~~LOC~~ SOLN
80.0000 mg | SUBCUTANEOUS | Status: DC
  Administered 2016-06-03: 80 mg via SUBCUTANEOUS
  Filled 2016-06-03: qty 0.8

## 2016-06-03 MED ORDER — POTASSIUM CHLORIDE 20 MEQ/15ML (10%) PO SOLN
40.0000 meq | Freq: Three times a day (TID) | ORAL | Status: AC
  Administered 2016-06-03 (×2): 20 meq
  Administered 2016-06-03: 40 meq
  Filled 2016-06-03 (×3): qty 30

## 2016-06-03 NOTE — Progress Notes (Signed)
Foley Catheter had hole at the drainage port and was leaking urine.  As per protocol and discussion with foley team insertion members, foley catheter was replaced.

## 2016-06-03 NOTE — Progress Notes (Signed)
PULMONARY / CRITICAL CARE MEDICINE   Name: [name] [name] MRN: [id number] DOB: 1981/08/23    ADMISSION DATE:  05/25/2016 CONSULTATION DATE:  05/27/16  REFERRING MD:  Dr. Ree Kida - [facility]  CHIEF COMPLAINT:  Altered Mental Status   BRIEF [name] with PMH of morbid obesity (469 lbs), DM, HTN and tachycardia who presented to [facility] on 10/3 with reports of abdominal bloating and heavy abdominal pressure.    On admit, the patient reported she had not taken her blood pressure medications in over one year.  She was profoundly hypertensive on admit as well, initial BP 215/151.  She was found to be hypoxic with saturations in the 70's which improved with 3L O2.  D-Dimer was elevated at 2.59 and a CTA of the chest showed CHF but no PE.  BNP 594.  The patient was admitted per [facility] and treated for acute decompensated CHF.  She was treated with IV diuresis, blood pressure control, afterload reduction and fluid restriction.  ECHO assessment revealed LVEF 50-55%, difficult to assess LV diastolic function, mildly dilated lA, RA, PA peak 42 mm Hg.  On 10/5 afternoon, the patient was found to be altered and difficult to arouse.  Rapid response team was activated.  ABG assessed > 7.16 & pCO2 > 100.  The patient placed on BiPAP and transferred to ICU.  With stimulation of transfer, the patient woke some and was able to interact with staff.    STUDIES:  ECHO 10/4 >> EF 55%, PAP 42.  CULTURES:   ANTIBIOTICS:   SIGNIFICANT EVENTS: 10/3  Admit. 10/5  Transferred to ICU due to AMS, hypercarbic respiratory failure 05/28/16:  RN reports pt remains on bipap, ntg gtt continues & BP remains elevated in 245'Y systolic.  Net negative 5.5 L in last 24 hours with improvement in sr cr  05/29/16 - intubated overnight . Not on presssors. Being diursed. Per RN - cards told her that patient is 7# down in fluids overnight. SBP @ 140s. On labetalol - vagaled with suction - hr drpped to 50s x 2 despite being off diprivan. She is on  labetalol and imdur - rn uanble to crush imdur   SUBJECTIVE/OVERNIGHT/INTERVAL HX 10/8 - 13L negative since admit 05/25/16. Weight from 211kg  -> 186 kg. Possible low grade fever developing. Improved pc02. Aggressive lasix cntinues. Follows commands off sedation diprivan gtt. No more vagal episodes after labetalol decrease. Still needing 60% fio2  VITAL SIGNS: BP 132/74 (BP Location: Left Wrist)   Pulse 99   Temp 99.6 F (37.6 C) (Axillary)   Resp (!) 21   Ht _0  (1.6 m)   Wt (!) 158.8 kg (350 lb)   LMP  (LMP Unknown)   SpO2 99%   BMI 62.00 kg/m   HEMODYNAMICS: CVP:  [13 mmHg-18 mmHg] 16 mmHg  VENTILATOR SETTINGS: Vent Mode: CPAP;PSV FiO2 (%):  [45 %] 45 % Set Rate:  [21 bmp] 21 bmp Vt Set:  [420 mL] 420 mL PEEP:  [5 cmH20] 5 cmH20 Pressure Support:  [15 cmH20-18 cmH20] 15 cmH20  INTAKE / OUTPUT: I/O last 3 completed shifts: In: 1729.9 [I.V.:204.9; NG/GT:1375; IV Piggyback:150] Out: 0998 [Urine:7965]  PHYSICAL EXAMINATION: General:  Morbidly obese female intubated and sedated, easily arousable.  Neuro:  Arousable, moving all ext to command, gaging. HEENT:  MM pink/moist, unable to assess JVD  Cardiovascular:  RRR, Nl S1/S2, -M/R/G, difficult to fully auscultate  Lungs:  Shallow, non-labored, lungs bilaterally diminished with diffuse crackles Abdomen:  Obese, soft, bsx4 active  Musculoskeletal:  No acute deformities  Skin:  Warm/dry, generalized edema  LABS: PULMONARY  Recent Labs Lab 05/28/16 2345 06/01/16 0305 06/01/16 0900 06/02/16 0325 06/03/16 0523  PHART 7.373 7.464* 7.389 7.371 7.362  PCO2ART 76.6* 70.2* 81.8* 83.2* 80.3*  PO2ART 78.3* 86.0 86.6 78.4* 70.3*  HCO3 43.7* 50.3* 48.1* 47.5* 44.5*  TCO2  --  >50  --   --   --   O2SAT 95.2 96.0 94.4 94.8 92.3   CBC  Recent Labs Lab 06/01/16 0554 06/02/16 0423 06/03/16 0425  HGB 12.9 12.5 12.6  HCT 45.1 44.4 45.0  WBC 4.5 4.5 5.8  PLT 183 192 213   COAGULATION No results for input(s): INR in  the last 168 hours. CARDIAC   No results for input(s): TROPONINI in the last 168 hours. No results for input(s): PROBNP in the last 168 hours.  CHEMISTRY  Recent Labs Lab 05/27/16 1636  05/30/16 1639 05/31/16 0313 05/31/16 2030 06/01/16 0554 06/02/16 0423 06/03/16 0425  NA 139  < > 143 143  --  143 146* 148*  K 5.1  < > 3.0* 3.2*  --  4.0 4.0 3.6  CL 97*  < > 88* 83*  --  88* 93* 97*  CO2 35*  < > 45* 45*  --  44* 41* 44*  GLUCOSE 121*  < > 79 77  --  116* 86 93  BUN 31*  < > 17 13  --  15 27* 40*  CREATININE 1.35*  < > 0.99 0.98  --  0.98 1.08* 1.16*  CALCIUM 8.5*  < > 8.3* 8.1*  --  8.4* 8.5* 8.8*  MG 1.9  --   --  1.2* 2.0 1.9 2.1 2.0  PHOS 5.6*  --   --  3.6  --  4.5 3.1 3.2  < > = values in this interval not displayed. Estimated Creatinine Clearance: 100.6 mL/min (by C-G formula based on SCr of 1.16 mg/dL (H)).  LIVER No results for input(s): AST, ALT, ALKPHOS, BILITOT, PROT, ALBUMIN, INR in the last 168 hours. INFECTIOUS  Recent Labs Lab 05/27/16 1636  LATICACIDVEN 1.7   ENDOCRINE CBG (last 3)   Recent Labs  06/02/16 2321 06/03/16 0318 06/03/16 0821  GLUCAP 99 96 123*   IMAGING x48h  - image(s) personally visualized  -   highlighted in bold Dg Chest Port 1 View  Result Date: 06/03/2016 CLINICAL DATA:  Hypoxia EXAM: PORTABLE CHEST 1 VIEW COMPARISON:  June 02, 2016 FINDINGS: Endotracheal tube tip is 4.6 cm above the carina. Nasogastric tube tip and side port are below the diaphragm. Central catheter tip is in the superior vena cava. No pneumothorax. There is persistent interstitial pulmonary edema with patchy alveolar opacity in the bases, likely edema. There is cardiomegaly with pulmonary venous hypertension. There is a small right pleural effusion. No adenopathy evident. IMPRESSION: Than catheter positions as described without pneumothorax. Evidence of a degree of congestive heart failure. There appears to be slightly less alveolar opacity in the left  perihilar region compared to 1 day prior. Lungs and cardiac silhouette otherwise unchanged. Electronically Signed   By: Lowella Grip III M.D.   On: 06/03/2016 07:09   Dg Chest Port 1 View  Result Date: 06/02/2016 CLINICAL DATA:  intubated EXAM: PORTABLE CHEST 1 VIEW COMPARISON:  06/01/2016 FINDINGS: Cardiomegaly again noted. Central vascular congestion and diffuse airspace disease bilaterally again noted consistent with pulmonary edema. Stable endotracheal and NG tube position. There is right IJ central line with tip in  right atrium. No pneumothorax. IMPRESSION: Central vascular congestion and diffuse airspace disease bilaterally again noted consistent with pulmonary edema. Stable endotracheal and NG tube position. There is right IJ central line with tip in right atrium. No pneumothorax. Electronically Signed   By: Lahoma Crocker M.D.   On: 06/02/2016 07:57   DISCUSSION: [name] with PMH of super morbid obesity, HTN, pulmonary hypertension admitted with decompensated CHF.  Decompensated 10/5 with hypercarbic respiratory failure.     PULMONARY A: Acute on Chronic Hypoxemic and Hypercarbic Respiratory Failure. Secondary pulmonary hypertension. Severe OSA / OHS with acute Decompensation - sleep study in 2012 with up to 75 events per hour with desaturation to 72%  P:   Begin PS trials but no extubation until further diureses Continue aggressive diuresis as BP and SCr permit. Pulmonary hygiene. O2 as needed to support sats > 92% CXR intermittently.  CARDIOVASCULAR A:  Hypertensive Urgency. Acute diastolic heart failure. Elevated troponin - due to demand ischemia. Hx HTN.  P:  Hydralazine 50 mg QD  Labetalol 200 mg TID PRN Hydralazine for SBP > 150 (with holding parameters for SBP < 110). Goal SBP < 150 per RN Cardiology following, appreciate the assistance. Continue aggressive diuresis as BP and SCr permit.  RENAL A:   AKI - improving P:   KVO fluids. Correct electrolytes as  indicated. Trend BMP / UOP Lasix 80 mg IV q6. Increase Diamox 500 q6 x4 doses KCl PO.  GASTROINTESTINAL A:   Morbid obesity. Nutrition. P:   NPO. TF per nutrition. Pt will need outpatient referral to Dr. Hassell Done for consideration of bypass surgery  HEMATOLOGIC A:   VTE prophylaxis. P:  SCD's / Lovenox for DVT prophylaxis CBC in AM.  INFECTIOUS A:   No indication of infection. P:   Monitor clinically.  ENDOCRINE A:   No acute issues. P:   No interventions required.  NEUROLOGIC A:   Acute encephalopathy - in the setting of hypercarbia. Depression - family reports pt is in "denial" - seen by Psych 05/28/16  P:   Monitor clinically. Limit sedating meds. Follow ABG intermittently   REPRODUCTIVE A: R/o pregnancy - had "abd bloating" x 1 week upon presentation 10/3, negative P: No acute interventions  FAMILY  - Updates: No family bedside 10/11  - Inter-disciplinary family meet or Palliative Care meeting due by: 10/12  The patient is critically ill with multiple organ systems failure and requires high complexity decision making for assessment and support, frequent evaluation and titration of therapies, application of advanced monitoring technologies and extensive interpretation of multiple databases.   Critical Care Time devoted to patient care services described in this note is  35  Minutes. This time reflects time of care of this signee Dr Jennet Maduro. This critical care time does not reflect procedure time, or teaching time or supervisory time of PA/NP/Med student/Med Resident etc but could involve care discussion time.  Rush Farmer, M.D. [facility] Pulmonary/Critical Care Medicine. Pager: [phone]. After hours pager: [phone].  06/03/2016 10:06 AM

## 2016-06-03 NOTE — Progress Notes (Signed)
[facility] ADULT ICU REPLACEMENT PROTOCOL FOR AM LAB REPLACEMENT ONLY  The patient does apply for the [facility] Adult ICU Electrolyte Replacment Protocol based on the criteria listed below:   1. Is GFR >/= 40 ml/min? Yes.    Patient's GFR today is >60 2. Is urine output >/= 0.5 ml/kg/hr for the last 6 hours? Yes.   Patient's UOP is 1.07 ml/kg/hr 3. Is BUN < 60 mg/dL? Yes.    Patient's BUN today is 40 4. Abnormal electrolyte  K 3.6 5. Ordered repletion with: per protocol 6. If a panic level lab has been reported, has the CCM MD in charge been notified? Yes.  .   Physician:  Percell Miller 06/03/2016 5:28 AM

## 2016-06-03 NOTE — Progress Notes (Signed)
Physical Therapy Treatment Patient Details Name: [name] [name] MRN: [id number] DOB: Jan 07, 1981 Today's Date: 06/03/2016    History of Present Illness [name] with PMH of morbid obesity (469 lbs), DM, HTN and tachycardia who presented to [facility] on 10/3 with reports of abdominal bloating and heavy abdominal pressure.  Intubated 05/29/16 to present.     PT Comments    Pt admitted with above diagnosis. Pt currently with functional limitations due to weakness, balance and endurance deficits. Pt was able to perform exercises today but not mobility as she was fatigued.  Nurse cut off sedation however could not get pt aroused enough to participate.  Will continue to attempt therapy.  Pt will benefit from skilled PT to increase their independence and safety with mobility to allow discharge to the venue listed below.    Follow Up Recommendations  SNF;Supervision/Assistance - 24 hour     Equipment Recommendations  Rolling walker with 5" wheels;3in1 (PT)    Recommendations for Other Services       Precautions / Restrictions Precautions Precautions: Fall Restrictions Weight Bearing Restrictions: No    Mobility  Bed Mobility               General bed mobility comments: Pt not responsive enough to attempt mobility.   Transfers                    Ambulation/Gait                 Stairs            Wheelchair Mobility    Modified Rankin (Stroke Patients Only)       Balance                                    Cognition Arousal/Alertness: Lethargic;Suspect due to medications Behavior During Therapy: Flat affect (pt at times not responding and kept closing eyes) Overall Cognitive Status: Impaired/Different from baseline Area of Impairment: Orientation;Following commands;Safety/judgement;Awareness;Problem solving Orientation Level: Disoriented to;Situation;Time;Place   Memory: Decreased recall of precautions;Decreased short-term  memory Following Commands: Follows one step commands inconsistently     Problem Solving: Slow processing;Decreased initiation;Difficulty sequencing;Requires verbal cues;Requires tactile cues General Comments: Pt much less alert today. For the most part unarousable.  Even when pts daughter came in and was talking with pt, she would open her eyes but did not respond even to her young daughter.      Exercises General Exercises - Upper Extremity Shoulder Flexion: Both;PROM;5 reps;Supine Elbow Flexion: PROM;Both;5 reps;Supine General Exercises - Lower Extremity Heel Slides: Supine;PROM;Both;5 reps    General Comments General comments (skin integrity, edema, etc.): Pt only moved her right LE three times to command and it was very inconsistent.       Pertinent Vitals/Pain Pain Assessment: No/denies pain  VSS with FiO2 45 and PEEP 5.      Home Living                      Prior Function            PT Goals (current goals can now be found in the care plan section) Progress towards PT goals: Not progressing toward goals - comment (Sedation lifted per MD but pt decr arousal.)    Frequency    Min 3X/week      PT Plan Current plan remains appropriate    Co-evaluation  End of Session Equipment Utilized During Treatment: Gait belt;Oxygen (on vent) Activity Tolerance: Patient limited by fatigue Patient left: with call bell/phone within reach;with family/visitor present;in bed;with bed alarm set     Time: 1537-9432 PT Time Calculation (min) (ACUTE ONLY): 18 min  Charges:  $Therapeutic Exercise: 8-22 mins                    G Codes:      [name] [name], Arrie Aran F 06/15/16, 1:45 PM [facility] [facility] [phone] (pager)

## 2016-06-04 ENCOUNTER — Inpatient Hospital Stay (HOSPITAL_COMMUNITY): Payer: PRIVATE HEALTH INSURANCE

## 2016-06-04 ENCOUNTER — Ambulatory Visit: Payer: Self-pay | Admitting: Cardiology

## 2016-06-04 LAB — BASIC METABOLIC PANEL
Anion gap: 8 (ref 5–15)
BUN: 48 mg/dL — ABNORMAL HIGH (ref 6–20)
CO2: 43 mmol/L — ABNORMAL HIGH (ref 22–32)
Calcium: 9.4 mg/dL (ref 8.9–10.3)
Chloride: 99 mmol/L — ABNORMAL LOW (ref 101–111)
Creatinine, Ser: 1.25 mg/dL — ABNORMAL HIGH (ref 0.44–1.00)
GFR calc Af Amer: >60 mL/min (ref >60)
GFR calc non Af Amer: 55 mL/min — ABNORMAL LOW (ref >60)
Glucose, Bld: 116 mg/dL — ABNORMAL HIGH (ref 65–99)
Potassium: 3.7 mmol/L (ref 3.5–5.1)
Sodium: 150 mmol/L — ABNORMAL HIGH (ref 135–145)

## 2016-06-04 LAB — GLUCOSE, CAPILLARY
Glucose-Capillary: 105 mg/dL — ABNORMAL HIGH (ref 65–99)
Glucose-Capillary: 113 mg/dL — ABNORMAL HIGH (ref 65–99)
Glucose-Capillary: 117 mg/dL — ABNORMAL HIGH (ref 65–99)
Glucose-Capillary: 117 mg/dL — ABNORMAL HIGH (ref 65–99)
Glucose-Capillary: 123 mg/dL — ABNORMAL HIGH (ref 65–99)
Glucose-Capillary: 136 mg/dL — ABNORMAL HIGH (ref 65–99)
Glucose-Capillary: 96 mg/dL (ref 65–99)

## 2016-06-04 LAB — BLOOD GAS, ARTERIAL
Acid-Base Excess: 18.7 mmol/L — ABNORMAL HIGH (ref 0.0–2.0)
Bicarbonate: 45.2 mmol/L — ABNORMAL HIGH (ref 20.0–28.0)
Drawn by: 46203
FIO2: 60
MECHVT: 420 mL
O2 Saturation: 92.3 %
PEEP: 5 cmH2O
Patient temperature: 98.6
RATE: 21 resp/min
pCO2 arterial: 81.1 mmHg (ref 32.0–48.0)
pH, Arterial: 7.364 (ref 7.350–7.450)
pO2, Arterial: 69.9 mmHg — ABNORMAL LOW (ref 83.0–108.0)

## 2016-06-04 LAB — CBC
HCT: 44.8 % (ref 36.0–46.0)
Hemoglobin: 12.6 g/dL (ref 12.0–15.0)
MCH: 24.1 pg — ABNORMAL LOW (ref 26.0–34.0)
MCHC: 28.1 g/dL — ABNORMAL LOW (ref 30.0–36.0)
MCV: 85.7 fL (ref 78.0–100.0)
Platelets: 193 10*3/uL (ref 150–400)
RBC: 5.23 MIL/uL — ABNORMAL HIGH (ref 3.87–5.11)
RDW: 21.6 % — ABNORMAL HIGH (ref 11.5–15.5)
WBC: 7 10*3/uL (ref 4.0–10.5)

## 2016-06-04 LAB — MAGNESIUM: Magnesium: 2.2 mg/dL (ref 1.7–2.4)

## 2016-06-04 LAB — PHOSPHORUS: Phosphorus: 3.1 mg/dL (ref 2.5–4.6)

## 2016-06-04 MED ORDER — POTASSIUM CHLORIDE 20 MEQ/15ML (10%) PO SOLN
40.0000 meq | Freq: Three times a day (TID) | ORAL | Status: AC
  Administered 2016-06-04 (×2): 40 meq
  Filled 2016-06-04 (×2): qty 30

## 2016-06-04 MED ORDER — FUROSEMIDE 10 MG/ML IJ SOLN
60.0000 mg | Freq: Four times a day (QID) | INTRAMUSCULAR | Status: AC
  Administered 2016-06-04 – 2016-06-05 (×4): 60 mg via INTRAVENOUS
  Filled 2016-06-04 (×4): qty 6

## 2016-06-04 MED ORDER — ENOXAPARIN SODIUM 80 MG/0.8ML ~~LOC~~ SOLN
70.0000 mg | SUBCUTANEOUS | Status: DC
  Administered 2016-06-04 – 2016-06-14 (×11): 70 mg via SUBCUTANEOUS
  Filled 2016-06-04 (×11): qty 0.8

## 2016-06-04 MED ORDER — ATROPINE SULFATE 1 MG/10ML IJ SOSY
PREFILLED_SYRINGE | INTRAMUSCULAR | Status: AC
  Filled 2016-06-04: qty 10

## 2016-06-04 MED ORDER — ACETAZOLAMIDE SODIUM 500 MG IJ SOLR
500.0000 mg | Freq: Four times a day (QID) | INTRAMUSCULAR | Status: AC
  Administered 2016-06-04 – 2016-06-05 (×4): 500 mg via INTRAVENOUS
  Filled 2016-06-04 (×4): qty 500

## 2016-06-04 MED ORDER — FREE WATER
250.0000 mL | Status: DC
  Administered 2016-06-04 – 2016-06-05 (×8): 250 mL

## 2016-06-04 MED ORDER — ISOSORBIDE DINITRATE 10 MG PO TABS
30.0000 mg | ORAL_TABLET | Freq: Two times a day (BID) | ORAL | Status: DC
  Administered 2016-06-04 – 2016-06-15 (×20): 30 mg via ORAL
  Filled 2016-06-04 (×20): qty 3

## 2016-06-04 MED ORDER — HYDRALAZINE HCL 50 MG PO TABS
75.0000 mg | ORAL_TABLET | Freq: Three times a day (TID) | ORAL | Status: DC
  Administered 2016-06-04 – 2016-06-18 (×39): 75 mg via ORAL
  Filled 2016-06-04 (×39): qty 1

## 2016-06-04 NOTE — Progress Notes (Signed)
PULMONARY / CRITICAL CARE MEDICINE   Name: [name] [name] MRN: [id number] DOB: 1981/01/29    ADMISSION DATE:  05/25/2016 CONSULTATION DATE:  05/27/16  REFERRING MD:  Dr. Ree Kida - [facility]  CHIEF COMPLAINT:  Altered Mental Status   BRIEF [name] with PMH of morbid obesity (469 lbs), DM, HTN and tachycardia who presented to [facility] on 10/3 with reports of abdominal bloating and heavy abdominal pressure.    On admit, the patient reported she had not taken her blood pressure medications in over one year.  She was profoundly hypertensive on admit as well, initial BP 215/151.  She was found to be hypoxic with saturations in the 70's which improved with 3L O2.  D-Dimer was elevated at 2.59 and a CTA of the chest showed CHF but no PE.  BNP 594.  The patient was admitted per [facility] and treated for acute decompensated CHF.  She was treated with IV diuresis, blood pressure control, afterload reduction and fluid restriction.  ECHO assessment revealed LVEF 50-55%, difficult to assess LV diastolic function, mildly dilated lA, RA, PA peak 42 mm Hg.  On 10/5 afternoon, the patient was found to be altered and difficult to arouse.  Rapid response team was activated.  ABG assessed > 7.16 & pCO2 > 100.  The patient placed on BiPAP and transferred to ICU.  With stimulation of transfer, the patient woke some and was able to interact with staff.    STUDIES:  ECHO 10/4 >> EF 55%, PAP 42.  CULTURES:   ANTIBIOTICS:   SIGNIFICANT EVENTS: 10/3  Admit. 10/5  Transferred to ICU due to AMS, hypercarbic respiratory failure 05/28/16:  RN reports pt remains on bipap, ntg gtt continues & BP remains elevated in 332'R systolic.  Net negative 5.5 L in last 24 hours with improvement in sr cr  05/29/16 - intubated overnight . Not on presssors. Being diursed. Per RN - cards told her that patient is 7# down in fluids overnight. SBP @ 140s. On labetalol - vagaled with suction - hr drpped to 50s x 2 despite being off diprivan. She is on  labetalol and imdur - rn uanble to crush imdur   SUBJECTIVE/OVERNIGHT/INTERVAL HX Significant diureses again overnight, no other events overnight.  VITAL SIGNS: BP (!) 156/94 (BP Location: Left Arm)   Pulse 94   Temp 99.2 F (37.3 C) (Oral)   Resp (!) 21   Ht _0  (1.6 m)   Wt (!) 146.1 kg (322 lb)   LMP  (LMP Unknown)   SpO2 93%   BMI 57.04 kg/m   HEMODYNAMICS: CVP:  [12 mmHg-16 mmHg] 12 mmHg  VENTILATOR SETTINGS: Vent Mode: PRVC FiO2 (%):  [50 %-60 %] 50 % Set Rate:  [21 bmp] 21 bmp Vt Set:  [420 mL] 420 mL PEEP:  [5 cmH20] 5 cmH20 Plateau Pressure:  [27 cmH20-30 cmH20] 30 cmH20  INTAKE / OUTPUT: I/O last 3 completed shifts: In: 1624.4 [I.V.:144.4; NG/GT:1280; IV Piggyback:200] Out: 5188 [Urine:6950]  PHYSICAL EXAMINATION: General:  Morbidly obese female intubated and sedated, easily arousable.  Neuro:  Arousable, moving all ext to command, gaging. HEENT:  MM pink/moist, unable to assess JVD  Cardiovascular:  RRR, Nl S1/S2, -M/R/G, difficult to fully auscultate  Lungs:  Shallow, non-labored, lungs bilaterally diminished with diffuse crackles Abdomen:  Obese, soft, bsx4 active  Musculoskeletal:  No acute deformities  Skin:  Warm/dry, generalized edema  LABS: PULMONARY  Recent Labs Lab 06/01/16 0305 06/01/16 0900 06/02/16 0325 06/03/16 0523 06/04/16 0415  PHART  7.464* 7.389 7.371 7.362 7.364  PCO2ART 70.2* 81.8* 83.2* 80.3* 81.1*  PO2ART 86.0 86.6 78.4* 70.3* 69.9*  HCO3 50.3* 48.1* 47.5* 44.5* 45.2*  TCO2 >50  --   --   --   --   O2SAT 96.0 94.4 94.8 92.3 92.3   CBC  Recent Labs Lab 06/02/16 0423 06/03/16 0425 06/04/16 0455  HGB 12.5 12.6 12.6  HCT 44.4 45.0 44.8  WBC 4.5 5.8 7.0  PLT 192 213 193   COAGULATION No results for input(s): INR in the last 168 hours. CARDIAC   No results for input(s): TROPONINI in the last 168 hours. No results for input(s): PROBNP in the last 168 hours.  CHEMISTRY  Recent Labs Lab 05/31/16 0313  05/31/16 2030 06/01/16 0554 06/02/16 0423 06/03/16 0425 06/04/16 0455  NA 143  --  143 146* 148* 150*  K 3.2*  --  4.0 4.0 3.6 3.7  CL 83*  --  88* 93* 97* 99*  CO2 45*  --  44* 41* 44* 43*  GLUCOSE 77  --  116* 86 93 116*  BUN 13  --  15 27* 40* 48*  CREATININE 0.98  --  0.98 1.08* 1.16* 1.25*  CALCIUM 8.1*  --  8.4* 8.5* 8.8* 9.4  MG 1.2* 2.0 1.9 2.1 2.0 2.2  PHOS 3.6  --  4.5 3.1 3.2 3.1   Estimated Creatinine Clearance: 88.3 mL/min (by C-G formula based on SCr of 1.25 mg/dL (H)).  LIVER No results for input(s): AST, ALT, ALKPHOS, BILITOT, PROT, ALBUMIN, INR in the last 168 hours. INFECTIOUS No results for input(s): LATICACIDVEN, PROCALCITON in the last 168 hours. ENDOCRINE CBG (last 3)   Recent Labs  06/04/16 0034 06/04/16 0324 06/04/16 0832  GLUCAP 117* 117* 136*   IMAGING x48h  - image(s) personally visualized  -   highlighted in bold Dg Chest Port 1 View  Result Date: 06/04/2016 CLINICAL DATA:  Endotracheal tube EXAM: PORTABLE CHEST 1 VIEW COMPARISON:  Yesterday FINDINGS: Endotracheal tube tip at the clavicular heads. An orogastric tube reaches stomach at least. Right IJ central line with tip at the upper cavoatrial junction. Low lung volumes with diffuse perihilar and basilar opacity. Marked cardiopericardial enlargement. No effusion or pneumothorax. IMPRESSION: 1. Stable positioning of tubes and central line. 2. Low volume chest with bilateral atelectasis or pneumonia. 3. Cardiomegaly and at least pulmonary venous congestion. Electronically Signed   By: Monte Fantasia M.D.   On: 06/04/2016 07:13   Dg Chest Port 1 View  Result Date: 06/03/2016 CLINICAL DATA:  Hypoxia EXAM: PORTABLE CHEST 1 VIEW COMPARISON:  June 02, 2016 FINDINGS: Endotracheal tube tip is 4.6 cm above the carina. Nasogastric tube tip and side port are below the diaphragm. Central catheter tip is in the superior vena cava. No pneumothorax. There is persistent interstitial pulmonary edema with  patchy alveolar opacity in the bases, likely edema. There is cardiomegaly with pulmonary venous hypertension. There is a small right pleural effusion. No adenopathy evident. IMPRESSION: Than catheter positions as described without pneumothorax. Evidence of a degree of congestive heart failure. There appears to be slightly less alveolar opacity in the left perihilar region compared to 1 day prior. Lungs and cardiac silhouette otherwise unchanged. Electronically Signed   By: Lowella Grip III M.D.   On: 06/03/2016 07:09   DISCUSSION: [name] with PMH of super morbid obesity, HTN, pulmonary hypertension admitted with decompensated CHF.  Decompensated 10/5 with hypercarbic respiratory failure.     PULMONARY A: Acute on Chronic  Hypoxemic and Hypercarbic Respiratory Failure. Secondary pulmonary hypertension. Severe OSA / OHS with acute Decompensation - sleep study in 2012 with up to 75 events per hour with desaturation to 72%  P:   Begin PS trials but no extubation until further diureses. Decrease Lasix dosing. Pulmonary hygiene. O2 as needed to support sats > 92%. CXR intermittently.  CARDIOVASCULAR A:  Hypertensive Urgency. Acute diastolic heart failure. Elevated troponin - due to demand ischemia. Hx HTN.  P:  Hydralazine 50 mg QD  Labetalol 200 mg TID PRN Hydralazine for SBP > 150 (with holding parameters for SBP < 110). Goal SBP < 150 per RN Cardiology signed off. Continue aggressive diuresis as BP and SCr permit.  RENAL A:   AKI - improving P:   KVO fluids. Correct electrolytes as indicated. Trend BMP / UOP. Lasix 60 mg IV q6 x4 doses. Increase Diamox 500 q6 x4 doses. KCl PO. Free water 250 ml q4 hours.  GASTROINTESTINAL A:   Morbid obesity. Nutrition. P:   NPO. TF per nutrition. Pt will need outpatient referral to Dr. Hassell Done for consideration of bypass surgery  HEMATOLOGIC A:   VTE prophylaxis. P:  SCD's / Lovenox for DVT prophylaxis CBC in  AM.  INFECTIOUS A:   No indication of infection. P:   Monitor clinically.  ENDOCRINE A:   No acute issues. P:   No interventions required.  NEUROLOGIC A:   Acute encephalopathy - in the setting of hypercarbia. Depression - family reports pt is in "denial" - seen by Psych 05/28/16  P:   Monitor clinically. Limit sedating meds. Follow ABG intermittently   REPRODUCTIVE A: R/o pregnancy - had "abd bloating" x 1 week upon presentation 10/3, negative P: No acute interventions  FAMILY  - Updates: No family bedside 10/13  - Inter-disciplinary family meet or Palliative Care meeting due by: 10/12  The patient is critically ill with multiple organ systems failure and requires high complexity decision making for assessment and support, frequent evaluation and titration of therapies, application of advanced monitoring technologies and extensive interpretation of multiple databases.   Critical Care Time devoted to patient care services described in this note is  35  Minutes. This time reflects time of care of this signee Dr Jennet Maduro. This critical care time does not reflect procedure time, or teaching time or supervisory time of PA/NP/Med student/Med Resident etc but could involve care discussion time.  Rush Farmer, M.D. [facility] Pulmonary/Critical Care Medicine. Pager: [phone]. After hours pager: [phone].  06/04/2016 8:58 AM

## 2016-06-04 NOTE — Progress Notes (Signed)
PT note Pt would benefit from OT consult.  Please order if you agree.  Thanks.   [place] [phone] (pager)

## 2016-06-04 NOTE — Progress Notes (Signed)
Pt experienced 6 sec pause following ETT advancement per CCM MD orders. Pt returned to SB 30-40s while coughing, SpO2 80s. Versed 3m given with Fent bolus. Dr. NAshok Cordiaw/ CCM notified. With stimulation and coughing pt drops HR. Dr. HWarren Lacywith cards notified. Instructed to not stimulate or let pt cough. Pt experienced another pause while coughing with HR returning to the 20s. Dr. YNelda Marseillepaged. Orders given for CXR to verify ETT placement and to keep atropine at the bedside. Will continue to monitor and assess pt closely. Family at bedside and updated.

## 2016-06-04 NOTE — Progress Notes (Signed)
Patient Name: [name] [name] Date of Encounter: 06/04/2016  Hospital Problem List     Principal Problem:   Acute congestive heart failure [facility]) Active Problems:   Malignant hypertension   Morbid obesity (HCC)   Sleep apnea   Renal insufficiency   Acute pulmonary edema (HCC)   Abdominal pain   Hepatic congestion   Sinus tachycardia   Pericardial effusion   Pulmonary hypertension   Acute respiratory failure with hypoxia (HCC)   Difficult intravenous access    Patient Profile     [name] with PMH of morbid obesity (469 lbs), DM, HTN and tachycardia who presented to [facility] on 10/3 with reports of abdominal bloating and heavy abdominal pressure.  On admit, the patient reported she had not taken her blood pressure medications in over one year.  She was profoundly hypertensive on admit as well, initial BP 215/151.  She was found to be hypoxic with saturations in the 70'.  EF was poor quality but appeared to be preserved EF.  She was eventually intubated   Subjective   Intubated and sedated.   Inpatient Medications    . chlorhexidine gluconate (MEDLINE KIT)  15 mL Mouth Rinse BID  . docusate  100 mg Per Tube BID  . enoxaparin (LOVENOX) injection  80 mg Subcutaneous Q24H  . famotidine (PEPCID) IV  20 mg Intravenous Q12H  . feeding supplement (PRO-STAT SUGAR FREE 64)  60 mL Per Tube TID  . feeding supplement (VITAL HIGH PROTEIN)  1,000 mL Per Tube Q24H  . fluconazole  100 mg Oral Daily  . fluconazole  200 mg Oral Once  . furosemide  80 mg Intravenous Q6H  . hydrALAZINE  50 mg Oral Q8H  . isosorbide dinitrate  15 mg Oral BID  . labetalol  200 mg Oral TID  . magnesium hydroxide  30 mL Per Tube Daily  . mouth rinse  15 mL Mouth Rinse 10 times per day  . polyethylene glycol  17 g Oral Daily  . sodium chloride flush  10-40 mL Intracatheter Q12H  . sodium chloride flush  3 mL Intravenous Q12H  . THROMBI-PAD  1 each Topical Once    Vital Signs    Vitals:   06/04/16 0603  06/04/16 0700 06/04/16 0750 06/04/16 0800  BP: (!) 144/89 (!) 152/97  (!) 156/94  Pulse: 95 92  94  Resp: (!) 21 (!) 21  (!) 21  Temp:    99.2 F (37.3 C)  TempSrc:    Oral  SpO2: 97% 97% 95% 93%  Weight:      Height:        Intake/Output Summary (Last 24 hours) at 06/04/16 0906 Last data filed at 06/04/16 0800  Gross per 24 hour  Intake            918.7 ml  Output             4525 ml  Net          -3606.3 ml   Filed Weights   06/02/16 0200 06/03/16 0500 06/04/16 0500  Weight: (!) 370 lb (167.8 kg) (!) 350 lb (158.8 kg) (!) 322 lb (146.1 kg)    Physical Exam    GEN: Well nourished, well developed, intubated Neck:   Difficult to examine with intubation and large nec, Cardiac: RRR, no  rubs, or gallops. No clubbing, cyanosis, diffuese edema.  Radials/DP/PT 2+ and equal bilaterally.  Respiratory:  Respirations  regular but decreased GI: Soft, nontender, nondistended, BS +  x 4. Neuro:  Sedated   Labs    CBC  Recent Labs  06/02/16 0423 06/03/16 0425 06/04/16 0455  WBC 4.5 5.8 7.0  NEUTROABS 2.4 3.1  --   HGB 12.5 12.6 12.6  HCT 44.4 45.0 44.8  MCV 84.7 86.0 85.7  PLT 192 213 195   Basic Metabolic Panel  Recent Labs  06/03/16 0425 06/04/16 0455  NA 148* 150*  K 3.6 3.7  CL 97* 99*  CO2 44* 43*  GLUCOSE 93 116*  BUN 40* 48*  CREATININE 1.16* 1.25*  CALCIUM 8.8* 9.4  MG 2.0 2.2  PHOS 3.2 3.1   Liver Function Tests No results for input(s): AST, ALT, ALKPHOS, BILITOT, PROT, ALBUMIN in the last 72 hours. No results for input(s): LIPASE, AMYLASE in the last 72 hours. Cardiac Enzymes No results for input(s): CKTOTAL, CKMB, CKMBINDEX, TROPONINI in the last 72 hours. BNP Invalid input(s): POCBNP D-Dimer No results for input(s): DDIMER in the last 72 hours. Hemoglobin A1C No results for input(s): HGBA1C in the last 72 hours. Fasting Lipid Panel No results for input(s): CHOL, HDL, LDLCALC, TRIG, CHOLHDL, LDLDIRECT in the last 72 hours. Thyroid Function  Tests No results for input(s): TSH, T4TOTAL, T3FREE, THYROIDAB in the last 72 hours.  Invalid input(s): FREET3  Telemetry    Sinus, sinus tach  ECG    NA  Radiology    Dg Chest 2 View  Result Date: 05/25/2016 CLINICAL DATA:  Chest pain and shortness of breath, 2 days duration. EXAM: CHEST  2 VIEW COMPARISON:  None. FINDINGS: Cardiac silhouette is markedly enlarged consistent with cardiomegaly and/or pericardial fluid. There is pulmonary venous hypertension with interstitial and alveolar pulmonary edema. No pleural effusion at this time. Bony structures are unremarkable. IMPRESSION: Enlarged cardiac silhouette consistent with cardiomegaly and/or pericardial fluid. Congestive heart failure with interstitial and alveolar edema. Electronically Signed   By: Nelson Chimes M.D.   On: 05/25/2016 13:14   Ct Angio Chest Pe W And/or Wo Contrast  Result Date: 05/25/2016 CLINICAL DATA:  Shortness of Breath and elevated D-dimer EXAM: CT ANGIOGRAPHY CHEST WITH CONTRAST TECHNIQUE: Multidetector CT imaging of the chest was performed using the standard protocol during bolus administration of intravenous contrast. Multiplanar CT image reconstructions and MIPs were obtained to evaluate the vascular anatomy. CONTRAST:  80 mL Isovue 370 nonionic COMPARISON:  Chest radiograph May 25, 2016 FINDINGS: Cardiovascular: There is no central vessel pulmonary embolus. Due to attenuation artifact due to the patient's large size, assessment of the peripheral pulmonary arterial vessels is less than optimal. There is no obvious pulmonary embolus more distally allowing for a degree of attenuation artifact which is symmetric bilaterally. There is no demonstrable thoracic aortic aneurysm or dissection. The main pulmonary outflow tract measures 4.9 cm which is felt to be compatible with a degree of pulmonary arterial hypertension. Visualized great vessels appear unremarkable. There is cardiomegaly. There is pericardial fluid along  the right side of the heart laterally. Mediastinum/Nodes: Thyroid appears unremarkable. There is no appreciable thoracic adenopathy. Lungs/Pleura: There is pulmonary edema throughout the lungs bilaterally, most pronounced in the perihilar and lower lobe regions but also moderate in the inferior lingula and right middle lobe regions. There is no well-defined airspace consolidation. There are no appreciable pleural effusions. Upper Abdomen: There is reflux of contrast into the inferior vena cava and pulmonary veins. Visualized upper abdominal structures otherwise appear unremarkable. Musculoskeletal: There is degenerative change in the thoracic spine with diffuse idiopathic skeletal hyperostosis. No blastic or lytic bone lesions  are evident. Review of the MIP images confirms the above findings. IMPRESSION: No pulmonary embolus demonstrable. Visualization of the peripheral pulmonary arteries is less than optimal due to attenuation artifact from patient's large size. Main pulmonary outflow tract enlargement consistent with pulmonary arterial hypertension. Cardiomegaly with pulmonary edema consistent with congestive heart failure. Small pericardial effusion in the right heart region laterally. No pleural effusions evident. No adenopathy. Reflux of contrast in the inferior vena cava and hepatic veins is felt to be indicative of increased right heart pressure. Diffuse idiopathic skeletal hyperostosis in the thoracic spine. Electronically Signed   By: Lowella Grip III M.D.   On: 05/25/2016 14:55   Dg Chest Port 1 View  Result Date: 06/04/2016 CLINICAL DATA:  Endotracheal tube EXAM: PORTABLE CHEST 1 VIEW COMPARISON:  Yesterday FINDINGS: Endotracheal tube tip at the clavicular heads. An orogastric tube reaches stomach at least. Right IJ central line with tip at the upper cavoatrial junction. Low lung volumes with diffuse perihilar and basilar opacity. Marked cardiopericardial enlargement. No effusion or  pneumothorax. IMPRESSION: 1. Stable positioning of tubes and central line. 2. Low volume chest with bilateral atelectasis or pneumonia. 3. Cardiomegaly and at least pulmonary venous congestion. Electronically Signed   By: Monte Fantasia M.D.   On: 06/04/2016 07:13   Dg Chest Port 1 View  Result Date: 06/03/2016 CLINICAL DATA:  Hypoxia EXAM: PORTABLE CHEST 1 VIEW COMPARISON:  June 02, 2016 FINDINGS: Endotracheal tube tip is 4.6 cm above the carina. Nasogastric tube tip and side port are below the diaphragm. Central catheter tip is in the superior vena cava. No pneumothorax. There is persistent interstitial pulmonary edema with patchy alveolar opacity in the bases, likely edema. There is cardiomegaly with pulmonary venous hypertension. There is a small right pleural effusion. No adenopathy evident. IMPRESSION: Than catheter positions as described without pneumothorax. Evidence of a degree of congestive heart failure. There appears to be slightly less alveolar opacity in the left perihilar region compared to 1 day prior. Lungs and cardiac silhouette otherwise unchanged. Electronically Signed   By: Lowella Grip III M.D.   On: 06/03/2016 07:09   Dg Chest Port 1 View  Result Date: 06/02/2016 CLINICAL DATA:  intubated EXAM: PORTABLE CHEST 1 VIEW COMPARISON:  06/01/2016 FINDINGS: Cardiomegaly again noted. Central vascular congestion and diffuse airspace disease bilaterally again noted consistent with pulmonary edema. Stable endotracheal and NG tube position. There is right IJ central line with tip in right atrium. No pneumothorax. IMPRESSION: Central vascular congestion and diffuse airspace disease bilaterally again noted consistent with pulmonary edema. Stable endotracheal and NG tube position. There is right IJ central line with tip in right atrium. No pneumothorax. Electronically Signed   By: Lahoma Crocker M.D.   On: 06/02/2016 07:57   Dg Chest Port 1 View  Result Date: 06/01/2016 CLINICAL DATA:   Central line insertion EXAM: PORTABLE CHEST 1 VIEW COMPARISON:  06/01/2016 FINDINGS: Endotracheal tube and NG tube are unchanged. Right central line is been placed with the tip at the cavoatrial junction. No pneumothorax. There is cardiomegaly with diffuse bilateral airspace disease, likely edema/ CHF. Low lung volumes with bibasilar atelectasis. No visible effusions. IMPRESSION: Right central line tip at the cavoatrial junction.  No pneumothorax. Mild edema/ CHF.  Low lung volumes. Electronically Signed   By: Rolm Baptise M.D.   On: 06/01/2016 10:06   Dg Chest Port 1 View  Result Date: 06/01/2016 CLINICAL DATA:  Malignant hypertension, CHF, pulmonary edema, morbid obesity. EXAM: PORTABLE CHEST 1 VIEW COMPARISON:  Portable chest x-ray of May 31, 2016 FINDINGS: The lungs remain hypoinflated. The interstitial markings remain increased. The pulmonary vascularity remains engorged. The cardiac silhouette is markedly enlarged. The hemidiaphragms remain obscured. The endotracheal tube tip lies approximately 3.5 cm above the carina. The esophagogastric tube tip projects below the inferior margin of the image. IMPRESSION: CHF with pulmonary interstitial and alveolar edema. Probable small bilateral pleural effusions. Basilar pneumonia is not excluded. Allowing for differences in positioning there has not been significant interval change. The support tubes are in reasonable position. Electronically Signed   By: David  Martinique M.D.   On: 06/01/2016 07:35   Dg Chest Port 1 View  Result Date: 05/31/2016 CLINICAL DATA:  ET tube placement EXAM: PORTABLE CHEST 1 VIEW COMPARISON:  05/30/2016 FINDINGS: Endotracheal tube with the tip 2.5 cm above the carina. Nasogastric tube coursing below the diaphragm. Bilateral interstitial and alveolar airspace opacities with enlargement of the central pulmonary vasculature. No pneumothorax. Trace bilateral pleural effusions. Stable cardiomegaly. No acute osseous abnormality. IMPRESSION:  1. Endotracheal tube with the tip 2.5 cm above the carina. 2. Bilateral interstitial and alveolar airspace opacities with cardiomegaly compatible with CHF versus multilobar pneumonia. Electronically Signed   By: Kathreen Devoid   On: 05/31/2016 07:03   Dg Chest Port 1 View  Result Date: 05/30/2016 CLINICAL DATA:  Shortness of Breath EXAM: PORTABLE CHEST 1 VIEW COMPARISON:  May 29, 2016 FINDINGS: Endotracheal tube tip is 3.0 cm above the carina. Nasogastric tube tip and side port below the diaphragm. No pneumothorax. There is cardiomegaly with pulmonary venous hypertension. There are bilateral pleural effusions with patchy interstitial alveolar edema bilaterally, primarily in the bases. There are air bronchograms in both lower lobes. No adenopathy evident. IMPRESSION: Tube positions as described without pneumothorax. Evidence congestive heart failure. Question superimposed pneumonia in the bases. The appearance is stable compared to 1 day prior. Electronically Signed   By: Lowella Grip III M.D.   On: 05/30/2016 08:21   Dg Chest Port 1 View  Result Date: 05/29/2016 CLINICAL DATA:  Acute respiratory failure with hypoxia. On ventilator. EXAM: PORTABLE CHEST 1 VIEW COMPARISON:  05/28/2016 FINDINGS: Endotracheal tube and nasogastric tube remain in appropriate position. Worsening diffuse bilateral pulmonary airspace disease is seen since previous study. Heart size remains stable. No pneumothorax visualized. IMPRESSION: Interval worsening of diffuse bilateral pulmonary airspace disease. Electronically Signed   By: Earle Gell M.D.   On: 05/29/2016 11:16   Dg Chest Port 1 View  Result Date: 05/28/2016 CLINICAL DATA:  Endotracheal tube adjustment.  Initial encounter. EXAM: PORTABLE CHEST 1 VIEW COMPARISON:  Chest radiograph performed earlier today at 9:21 p.m. FINDINGS: The endotracheal tube is seen ending approximately 1 cm above the carina. This could be retracted 2 - 3 cm. The enteric tube is noted  extending below the diaphragm. The lungs are mildly hypoexpanded. Vascular congestion and vascular crowding are seen. Increased interstitial markings raise concern for mild pulmonary edema. No pleural effusion or pneumothorax is seen. The cardiomediastinal silhouette is enlarged. No acute osseous abnormalities are identified. IMPRESSION: 1. Endotracheal tube seen ending 1 cm above the carina. This could be retracted 2 - 3 cm. 2. Lungs mildly hypoexpanded. Vascular congestion and cardiomegaly. Increased interstitial markings raise concern for mild pulmonary edema. Electronically Signed   By: Garald Balding M.D.   On: 05/28/2016 22:44   Dg Chest Port 1 View  Result Date: 05/28/2016 CLINICAL DATA:  Endotracheal tube and orogastric tube placement. EXAM: PORTABLE CHEST 1 VIEW COMPARISON:  Chest radiograph  05/28/2016 at 4:57 a.m. FINDINGS: Endotracheal tube tip is just below the level of the clavicular heads, 1.5 cm above the inferior margin of the carina. Orogastric 2 courses below the diaphragm and beyond the field of view. There is unchanged cardiomegaly with diffuse pulmonary edema. Small left pleural effusion. No pneumothorax visualized. IMPRESSION: 1. Endotracheal tube tip just below the level of the clavicular heads, approximately 1.5 cm above the inferior margin of the carina. 2. Unchanged cardiomegaly and diffuse pulmonary edema. Electronically Signed   By: Ulyses Jarred M.D.   On: 05/28/2016 22:01   Dg Chest Port 1 View  Result Date: 05/28/2016 CLINICAL DATA:  Acute CHF. EXAM: PORTABLE CHEST 1 VIEW COMPARISON:  05/27/2016. FINDINGS: Cardiomegaly with pulmonary vascular prominence and bilateral interstitial prominence noted consistent with CHF. Similar findings noted on prior exam. Small pleural effusions cannot be excluded. No pneumothorax . IMPRESSION: Congestive heart failure with pulmonary interstitial edema. Small bilateral pleural effusions cannot be excluded. No significant interim change from  prior exam. Electronically Signed   By: [place]   On: 05/28/2016 07:14   Dg Chest Port 1 View  Result Date: 05/27/2016 CLINICAL DATA:  Loss of consciousness. EXAM: PORTABLE CHEST 1 VIEW COMPARISON:  05/25/2016 FINDINGS: Again noted is marked cardiomegaly. Diffuse pulmonary infiltrates are again identified, consistent with pulmonary edema and/or infectious process. IMPRESSION: Stable appearance of cardiomegaly and pulmonary infiltrates. Electronically Signed   By: Nolon Nations M.D.   On: 05/27/2016 14:52   Dg Abd Portable 1v  Result Date: 05/28/2016 CLINICAL DATA:  Orogastric tube placement.  Initial encounter. EXAM: PORTABLE ABDOMEN - 1 VIEW COMPARISON:  None. FINDINGS: The patient's orogastric tube is noted ending about the body of the stomach. The bowel gas pattern is difficult to assess given motion artifact. No acute osseous abnormalities are seen. Mild left basilar airspace opacity is seen. IMPRESSION: Orogastric tube noted ending about the body of the stomach. Electronically Signed   By: Garald Balding M.D.   On: 05/28/2016 22:46    Assessment & Plan    ACUTE DIASTOLIC HF:   Diuresed 40 liters thus far.  Continue diuresis per CCM.  BRADYCARDIA:  5 second pause noted on tele during suctioning.  No indication for change in therapy.   PULMONARY HTN:  Secondary to obesity, diastolic dysfunction.   DEMAND ISCHEMIA:   Medical management.   HTN:    Her BP is running high.  I will increase the Imdur and hydralazine.   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    Signed, Minus Breeding, MD  06/04/2016, 9:06 AM

## 2016-06-04 NOTE — Progress Notes (Signed)
Notified [facility]-LINK of pt's co2 81.1. Will continue to monitor.

## 2016-06-04 NOTE — Progress Notes (Signed)
PT Cancellation Note  Patient Details Name: [name] [name] MRN: [id number] DOB: 04/13/1981   Cancelled Treatment:    Reason Eval/Treat Not Completed: Medical issues which prohibited therapy (Pt having cardiac issues with turning in bed per nurse.HOLD)Will check back Monday.  Thanks.   Irwin Brakeman F 06/04/2016, 4:11 PM Radnor Heaton Sarin,PT Acute Rehabilitation [phone] (pager)

## 2016-06-04 NOTE — Care Management Note (Signed)
Case Management Note  Patient Details  Name: [name] [name] MRN: [id number] Date of Birth: [birth year]  Subjective/Objective:                    Action/Plan: Pt continues on ventilator. CM following for discharge needs.   Expected Discharge Date:                  Expected Discharge Plan:  [place]  In-House Referral:  Clinical Social Work  Discharge planning Services  CM Consult  Post Acute Care Choice:    Choice offered to:     DME Arranged:    DME Agency:     HH Arranged:    [place] Agency:     Status of Service:  In process, will continue to follow  If discussed at Long Length of Stay Meetings, dates discussed:    Additional Comments:  Pollie Friar, RN 06/04/2016, 3:37 PM

## 2016-06-05 ENCOUNTER — Inpatient Hospital Stay (HOSPITAL_COMMUNITY): Payer: PRIVATE HEALTH INSURANCE

## 2016-06-05 DIAGNOSIS — I5023 Acute on chronic systolic (congestive) heart failure: Secondary | ICD-10-CM

## 2016-06-05 LAB — BLOOD GAS, ARTERIAL
Acid-Base Excess: 17 mmol/L — ABNORMAL HIGH (ref 0.0–2.0)
Bicarbonate: 43.6 mmol/L — ABNORMAL HIGH (ref 20.0–28.0)
Drawn by: 46203
FIO2: 40
MECHVT: 420 mL
O2 Saturation: 88.5 %
PEEP: 5 cmH2O
Patient temperature: 98.6
RATE: 21 resp/min
pCO2 arterial: 82.8 mmHg (ref 32.0–48.0)
pH, Arterial: 7.341 — ABNORMAL LOW (ref 7.350–7.450)
pO2, Arterial: 63.2 mmHg — ABNORMAL LOW (ref 83.0–108.0)

## 2016-06-05 LAB — GLUCOSE, CAPILLARY
Glucose-Capillary: 110 mg/dL — ABNORMAL HIGH (ref 65–99)
Glucose-Capillary: 119 mg/dL — ABNORMAL HIGH (ref 65–99)
Glucose-Capillary: 120 mg/dL — ABNORMAL HIGH (ref 65–99)
Glucose-Capillary: 126 mg/dL — ABNORMAL HIGH (ref 65–99)
Glucose-Capillary: 136 mg/dL — ABNORMAL HIGH (ref 65–99)
Glucose-Capillary: 138 mg/dL — ABNORMAL HIGH (ref 65–99)

## 2016-06-05 LAB — PHOSPHORUS: Phosphorus: 4 mg/dL (ref 2.5–4.6)

## 2016-06-05 LAB — BASIC METABOLIC PANEL
Anion gap: 8 (ref 5–15)
BUN: 61 mg/dL — ABNORMAL HIGH (ref 6–20)
CO2: 42 mmol/L — ABNORMAL HIGH (ref 22–32)
Calcium: 9.6 mg/dL (ref 8.9–10.3)
Chloride: 101 mmol/L (ref 101–111)
Creatinine, Ser: 1.26 mg/dL — ABNORMAL HIGH (ref 0.44–1.00)
GFR calc Af Amer: >60 mL/min (ref >60)
GFR calc non Af Amer: 54 mL/min — ABNORMAL LOW (ref >60)
Glucose, Bld: 126 mg/dL — ABNORMAL HIGH (ref 65–99)
Potassium: 3.5 mmol/L (ref 3.5–5.1)
Sodium: 151 mmol/L — ABNORMAL HIGH (ref 135–145)

## 2016-06-05 LAB — MAGNESIUM: Magnesium: 2.5 mg/dL — ABNORMAL HIGH (ref 1.7–2.4)

## 2016-06-05 MED ORDER — AMLODIPINE BESYLATE 5 MG PO TABS
5.0000 mg | ORAL_TABLET | Freq: Every day | ORAL | Status: DC
  Administered 2016-06-05: 5 mg
  Filled 2016-06-05: qty 1

## 2016-06-05 MED ORDER — FUROSEMIDE 10 MG/ML IJ SOLN
60.0000 mg | Freq: Three times a day (TID) | INTRAMUSCULAR | Status: DC
  Administered 2016-06-05 – 2016-06-09 (×12): 60 mg via INTRAVENOUS
  Filled 2016-06-05 (×13): qty 6

## 2016-06-05 MED ORDER — FREE WATER
250.0000 mL | Status: DC
Start: 2016-06-05 — End: 2016-06-09
  Administered 2016-06-05 – 2016-06-09 (×22): 250 mL

## 2016-06-05 MED ORDER — ACETAZOLAMIDE 250 MG PO TABS
250.0000 mg | ORAL_TABLET | Freq: Every day | ORAL | Status: DC
  Administered 2016-06-05 – 2016-06-08 (×4): 250 mg via ORAL
  Filled 2016-06-05 (×6): qty 1

## 2016-06-05 MED ORDER — POTASSIUM CHLORIDE 20 MEQ/15ML (10%) PO SOLN
40.0000 meq | Freq: Once | ORAL | Status: AC
  Administered 2016-06-05: 40 meq
  Filled 2016-06-05: qty 30

## 2016-06-05 NOTE — Progress Notes (Signed)
06/05/16 1000  Clinical Encounter Type  Visited With Patient and family together  Visit Type Initial;Psychological support;Spiritual support;Social support  Referral From Care management  Consult/Referral To Chaplain  Spiritual Encounters  Spiritual Needs Emotional  Stress Factors  Family Stress Factors Health changes;Lack of knowledge;Loss;Loss of control;Major life changes  Chaplain was paged to give support a family who had recently suffered a lost. Family  thought that the Pt had passed away however, Pt was stable. Chaplain prayed with family and provided emotional and spiritual care via religious text.

## 2016-06-05 NOTE — Progress Notes (Signed)
Pt had a 10seconds pause whiles she was in a coughing spell. Head of the bed  was flat at the time of the pause. Sinus rhythm regained spontaneously. Will continue to monitor

## 2016-06-05 NOTE — Progress Notes (Signed)
Patient ID: [name] [name], female   DOB: February 06, 1981, 35 y.o.   MRN: [id number]   SUBJECTIVE: Patient remains intubated/sedated.  CVP 13-14.  Diuresed well again yesterday on Lasix IV + acetazolamide, no labs yet this morning.   Telemetry continues to show periodic long pauses, she had 10 second sinus pause during a coughing spell.   Scheduled Meds: . acetaZOLAMIDE  250 mg Oral Daily  . amLODipine  5 mg Per Tube Daily  . chlorhexidine gluconate (MEDLINE KIT)  15 mL Mouth Rinse BID  . docusate  100 mg Per Tube BID  . enoxaparin (LOVENOX) injection  70 mg Subcutaneous Q24H  . famotidine (PEPCID) IV  20 mg Intravenous Q12H  . feeding supplement (PRO-STAT SUGAR FREE 64)  60 mL Per Tube TID  . feeding supplement (VITAL HIGH PROTEIN)  1,000 mL Per Tube Q24H  . free water  250 mL Per Tube Q4H  . furosemide  60 mg Intravenous Q8H  . hydrALAZINE  75 mg Oral Q8H  . isosorbide dinitrate  30 mg Oral BID  . magnesium hydroxide  30 mL Per Tube Daily  . mouth rinse  15 mL Mouth Rinse 10 times per day  . polyethylene glycol  17 g Oral Daily  . potassium chloride  40 mEq Per Tube Once  . sodium chloride flush  10-40 mL Intracatheter Q12H  . sodium chloride flush  3 mL Intravenous Q12H  . THROMBI-PAD  1 each Topical Once   Continuous Infusions: . fentaNYL infusion INTRAVENOUS Stopped (06/05/16 0800)   PRN Meds:.acetaminophen (TYLENOL) oral liquid 160 mg/5 mL, fentaNYL (SUBLIMAZE) injection, hydrALAZINE, midazolam, ondansetron **OR** ondansetron (ZOFRAN) IV, sodium chloride flush    Vitals:   06/05/16 0620 06/05/16 0700 06/05/16 0722 06/05/16 0800  BP: 134/79 131/74  124/72  Pulse: 87 88 88 85  Resp: (!) 21 (!) 21 (!) 21 (!) 21  Temp:   98.8 F (37.1 C)   TempSrc:   Oral   SpO2: 100% 100% 100% 100%  Weight:      Height:        Intake/Output Summary (Last 24 hours) at 06/05/16 0836 Last data filed at 06/05/16 0800  Gross per 24 hour  Intake          2436.21 ml  Output             5205  ml  Net         -2768.79 ml    LABS: Basic Metabolic Panel:  Recent Labs  06/03/16 0425 06/04/16 0455  NA 148* 150*  K 3.6 3.7  CL 97* 99*  CO2 44* 43*  GLUCOSE 93 116*  BUN 40* 48*  CREATININE 1.16* 1.25*  CALCIUM 8.8* 9.4  MG 2.0 2.2  PHOS 3.2 3.1   Liver Function Tests: No results for input(s): AST, ALT, ALKPHOS, BILITOT, PROT, ALBUMIN in the last 72 hours. No results for input(s): LIPASE, AMYLASE in the last 72 hours. CBC:  Recent Labs  06/03/16 0425 06/04/16 0455  WBC 5.8 7.0  NEUTROABS 3.1  --   HGB 12.6 12.6  HCT 45.0 44.8  MCV 86.0 85.7  PLT 213 193   Cardiac Enzymes: No results for input(s): CKTOTAL, CKMB, CKMBINDEX, TROPONINI in the last 72 hours. BNP: Invalid input(s): POCBNP D-Dimer: No results for input(s): DDIMER in the last 72 hours. Hemoglobin A1C: No results for input(s): HGBA1C in the last 72 hours. Fasting Lipid Panel: No results for input(s): CHOL, HDL, LDLCALC, TRIG, CHOLHDL, LDLDIRECT in the last 72  hours. Thyroid Function Tests: No results for input(s): TSH, T4TOTAL, T3FREE, THYROIDAB in the last 72 hours.  Invalid input(s): FREET3 Anemia Panel: No results for input(s): VITAMINB12, FOLATE, FERRITIN, TIBC, IRON, RETICCTPCT in the last 72 hours.  RADIOLOGY: Dg Chest 2 View  Result Date: 05/25/2016 CLINICAL DATA:  Chest pain and shortness of breath, 2 days duration. EXAM: CHEST  2 VIEW COMPARISON:  None. FINDINGS: Cardiac silhouette is markedly enlarged consistent with cardiomegaly and/or pericardial fluid. There is pulmonary venous hypertension with interstitial and alveolar pulmonary edema. No pleural effusion at this time. Bony structures are unremarkable. IMPRESSION: Enlarged cardiac silhouette consistent with cardiomegaly and/or pericardial fluid. Congestive heart failure with interstitial and alveolar edema. Electronically Signed   By: Nelson Chimes M.D.   On: 05/25/2016 13:14   Ct Angio Chest Pe W And/or Wo Contrast  Result Date:  05/25/2016 CLINICAL DATA:  Shortness of Breath and elevated D-dimer EXAM: CT ANGIOGRAPHY CHEST WITH CONTRAST TECHNIQUE: Multidetector CT imaging of the chest was performed using the standard protocol during bolus administration of intravenous contrast. Multiplanar CT image reconstructions and MIPs were obtained to evaluate the vascular anatomy. CONTRAST:  80 mL Isovue 370 nonionic COMPARISON:  Chest radiograph May 25, 2016 FINDINGS: Cardiovascular: There is no central vessel pulmonary embolus. Due to attenuation artifact due to the patient's large size, assessment of the peripheral pulmonary arterial vessels is less than optimal. There is no obvious pulmonary embolus more distally allowing for a degree of attenuation artifact which is symmetric bilaterally. There is no demonstrable thoracic aortic aneurysm or dissection. The main pulmonary outflow tract measures 4.9 cm which is felt to be compatible with a degree of pulmonary arterial hypertension. Visualized great vessels appear unremarkable. There is cardiomegaly. There is pericardial fluid along the right side of the heart laterally. Mediastinum/Nodes: Thyroid appears unremarkable. There is no appreciable thoracic adenopathy. Lungs/Pleura: There is pulmonary edema throughout the lungs bilaterally, most pronounced in the perihilar and lower lobe regions but also moderate in the inferior lingula and right middle lobe regions. There is no well-defined airspace consolidation. There are no appreciable pleural effusions. Upper Abdomen: There is reflux of contrast into the inferior vena cava and pulmonary veins. Visualized upper abdominal structures otherwise appear unremarkable. Musculoskeletal: There is degenerative change in the thoracic spine with diffuse idiopathic skeletal hyperostosis. No blastic or lytic bone lesions are evident. Review of the MIP images confirms the above findings. IMPRESSION: No pulmonary embolus demonstrable. Visualization of the  peripheral pulmonary arteries is less than optimal due to attenuation artifact from patient's large size. Main pulmonary outflow tract enlargement consistent with pulmonary arterial hypertension. Cardiomegaly with pulmonary edema consistent with congestive heart failure. Small pericardial effusion in the right heart region laterally. No pleural effusions evident. No adenopathy. Reflux of contrast in the inferior vena cava and hepatic veins is felt to be indicative of increased right heart pressure. Diffuse idiopathic skeletal hyperostosis in the thoracic spine. Electronically Signed   By: Lowella Grip III M.D.   On: 05/25/2016 14:55   Dg Chest Port 1 View  Result Date: 06/05/2016 CLINICAL DATA:  Ventilator dependence. EXAM: PORTABLE CHEST 1 VIEW COMPARISON:  06/04/2016 FINDINGS: 0440 hours. Endotracheal tube tip is 2.8 cm above the base of the carina. Right IJ central line tip overlies the mid SVC level. The NG tube passes into the stomach although the distal tip position is not included on the film. Lung volumes remain low with diffuse interstitial and bilateral airspace disease. The cardio pericardial silhouette is enlarged. Telemetry  leads overlie the chest. IMPRESSION: No substantial change. Cardiomegaly with interstitial in diffuse bilateral airspace disease compatible with edema or pneumonia. Electronically Signed   By: Misty Stanley M.D.   On: 06/05/2016 07:52   Dg Chest Port 1 View  Result Date: 06/04/2016 CLINICAL DATA:  Intubation. History of hypertension and diabetes. Sinus tachycardia EXAM: PORTABLE CHEST 1 VIEW COMPARISON:  06/04/2016, 06/03/2016 FINDINGS: Endotracheal tube tip is approximately 9 mm superior to the carina. Esophageal tube tip is below the diaphragm but is not included. Right-sided central venous catheter tip poorly visualized, it appears to overlie the cavoatrial region. There are low lung volumes. There is interval increase in bibasilar airspace opacification. There are  tiny bilateral effusions. There is stable moderate enlargement of the cardiomediastinal rim. No pneumothorax. IMPRESSION: 1. Tip of the endotracheal tube is near the carina. 2. Markedly low lung volumes with increasing bibasilar atelectasis or pneumonia. Suspect small effusions. 3. Stable moderate to marked cardiomegaly with central vascular congestion. Electronically Signed   By: Donavan Foil M.D.   On: 06/04/2016 14:04   Dg Chest Port 1 View  Result Date: 06/04/2016 CLINICAL DATA:  Endotracheal tube EXAM: PORTABLE CHEST 1 VIEW COMPARISON:  Yesterday FINDINGS: Endotracheal tube tip at the clavicular heads. An orogastric tube reaches stomach at least. Right IJ central line with tip at the upper cavoatrial junction. Low lung volumes with diffuse perihilar and basilar opacity. Marked cardiopericardial enlargement. No effusion or pneumothorax. IMPRESSION: 1. Stable positioning of tubes and central line. 2. Low volume chest with bilateral atelectasis or pneumonia. 3. Cardiomegaly and at least pulmonary venous congestion. Electronically Signed   By: Monte Fantasia M.D.   On: 06/04/2016 07:13   Dg Chest Port 1 View  Result Date: 06/03/2016 CLINICAL DATA:  Hypoxia EXAM: PORTABLE CHEST 1 VIEW COMPARISON:  June 02, 2016 FINDINGS: Endotracheal tube tip is 4.6 cm above the carina. Nasogastric tube tip and side port are below the diaphragm. Central catheter tip is in the superior vena cava. No pneumothorax. There is persistent interstitial pulmonary edema with patchy alveolar opacity in the bases, likely edema. There is cardiomegaly with pulmonary venous hypertension. There is a small right pleural effusion. No adenopathy evident. IMPRESSION: Than catheter positions as described without pneumothorax. Evidence of a degree of congestive heart failure. There appears to be slightly less alveolar opacity in the left perihilar region compared to 1 day prior. Lungs and cardiac silhouette otherwise unchanged.  Electronically Signed   By: Lowella Grip III M.D.   On: 06/03/2016 07:09   Dg Chest Port 1 View  Result Date: 06/02/2016 CLINICAL DATA:  intubated EXAM: PORTABLE CHEST 1 VIEW COMPARISON:  06/01/2016 FINDINGS: Cardiomegaly again noted. Central vascular congestion and diffuse airspace disease bilaterally again noted consistent with pulmonary edema. Stable endotracheal and NG tube position. There is right IJ central line with tip in right atrium. No pneumothorax. IMPRESSION: Central vascular congestion and diffuse airspace disease bilaterally again noted consistent with pulmonary edema. Stable endotracheal and NG tube position. There is right IJ central line with tip in right atrium. No pneumothorax. Electronically Signed   By: Lahoma Crocker M.D.   On: 06/02/2016 07:57   Dg Chest Port 1 View  Result Date: 06/01/2016 CLINICAL DATA:  Central line insertion EXAM: PORTABLE CHEST 1 VIEW COMPARISON:  06/01/2016 FINDINGS: Endotracheal tube and NG tube are unchanged. Right central line is been placed with the tip at the cavoatrial junction. No pneumothorax. There is cardiomegaly with diffuse bilateral airspace disease, likely edema/ CHF. Low  lung volumes with bibasilar atelectasis. No visible effusions. IMPRESSION: Right central line tip at the cavoatrial junction.  No pneumothorax. Mild edema/ CHF.  Low lung volumes. Electronically Signed   By: Rolm Baptise M.D.   On: 06/01/2016 10:06   Dg Chest Port 1 View  Result Date: 06/01/2016 CLINICAL DATA:  Malignant hypertension, CHF, pulmonary edema, morbid obesity. EXAM: PORTABLE CHEST 1 VIEW COMPARISON:  Portable chest x-ray of May 31, 2016 FINDINGS: The lungs remain hypoinflated. The interstitial markings remain increased. The pulmonary vascularity remains engorged. The cardiac silhouette is markedly enlarged. The hemidiaphragms remain obscured. The endotracheal tube tip lies approximately 3.5 cm above the carina. The esophagogastric tube tip projects below the  inferior margin of the image. IMPRESSION: CHF with pulmonary interstitial and alveolar edema. Probable small bilateral pleural effusions. Basilar pneumonia is not excluded. Allowing for differences in positioning there has not been significant interval change. The support tubes are in reasonable position. Electronically Signed   By: David  Martinique M.D.   On: 06/01/2016 07:35   Dg Chest Port 1 View  Result Date: 05/31/2016 CLINICAL DATA:  ET tube placement EXAM: PORTABLE CHEST 1 VIEW COMPARISON:  05/30/2016 FINDINGS: Endotracheal tube with the tip 2.5 cm above the carina. Nasogastric tube coursing below the diaphragm. Bilateral interstitial and alveolar airspace opacities with enlargement of the central pulmonary vasculature. No pneumothorax. Trace bilateral pleural effusions. Stable cardiomegaly. No acute osseous abnormality. IMPRESSION: 1. Endotracheal tube with the tip 2.5 cm above the carina. 2. Bilateral interstitial and alveolar airspace opacities with cardiomegaly compatible with CHF versus multilobar pneumonia. Electronically Signed   By: Kathreen Devoid   On: 05/31/2016 07:03   Dg Chest Port 1 View  Result Date: 05/30/2016 CLINICAL DATA:  Shortness of Breath EXAM: PORTABLE CHEST 1 VIEW COMPARISON:  May 29, 2016 FINDINGS: Endotracheal tube tip is 3.0 cm above the carina. Nasogastric tube tip and side port below the diaphragm. No pneumothorax. There is cardiomegaly with pulmonary venous hypertension. There are bilateral pleural effusions with patchy interstitial alveolar edema bilaterally, primarily in the bases. There are air bronchograms in both lower lobes. No adenopathy evident. IMPRESSION: Tube positions as described without pneumothorax. Evidence congestive heart failure. Question superimposed pneumonia in the bases. The appearance is stable compared to 1 day prior. Electronically Signed   By: Lowella Grip III M.D.   On: 05/30/2016 08:21   Dg Chest Port 1 View  Result Date:  05/29/2016 CLINICAL DATA:  Acute respiratory failure with hypoxia. On ventilator. EXAM: PORTABLE CHEST 1 VIEW COMPARISON:  05/28/2016 FINDINGS: Endotracheal tube and nasogastric tube remain in appropriate position. Worsening diffuse bilateral pulmonary airspace disease is seen since previous study. Heart size remains stable. No pneumothorax visualized. IMPRESSION: Interval worsening of diffuse bilateral pulmonary airspace disease. Electronically Signed   By: Earle Gell M.D.   On: 05/29/2016 11:16   Dg Chest Port 1 View  Result Date: 05/28/2016 CLINICAL DATA:  Endotracheal tube adjustment.  Initial encounter. EXAM: PORTABLE CHEST 1 VIEW COMPARISON:  Chest radiograph performed earlier today at 9:21 p.m. FINDINGS: The endotracheal tube is seen ending approximately 1 cm above the carina. This could be retracted 2 - 3 cm. The enteric tube is noted extending below the diaphragm. The lungs are mildly hypoexpanded. Vascular congestion and vascular crowding are seen. Increased interstitial markings raise concern for mild pulmonary edema. No pleural effusion or pneumothorax is seen. The cardiomediastinal silhouette is enlarged. No acute osseous abnormalities are identified. IMPRESSION: 1. Endotracheal tube seen ending 1 cm above the  carina. This could be retracted 2 - 3 cm. 2. Lungs mildly hypoexpanded. Vascular congestion and cardiomegaly. Increased interstitial markings raise concern for mild pulmonary edema. Electronically Signed   By: Garald Balding M.D.   On: 05/28/2016 22:44   Dg Chest Port 1 View  Result Date: 05/28/2016 CLINICAL DATA:  Endotracheal tube and orogastric tube placement. EXAM: PORTABLE CHEST 1 VIEW COMPARISON:  Chest radiograph 05/28/2016 at 4:57 a.m. FINDINGS: Endotracheal tube tip is just below the level of the clavicular heads, 1.5 cm above the inferior margin of the carina. Orogastric 2 courses below the diaphragm and beyond the field of view. There is unchanged cardiomegaly with diffuse  pulmonary edema. Small left pleural effusion. No pneumothorax visualized. IMPRESSION: 1. Endotracheal tube tip just below the level of the clavicular heads, approximately 1.5 cm above the inferior margin of the carina. 2. Unchanged cardiomegaly and diffuse pulmonary edema. Electronically Signed   By: Ulyses Jarred M.D.   On: 05/28/2016 22:01   Dg Chest Port 1 View  Result Date: 05/28/2016 CLINICAL DATA:  Acute CHF. EXAM: PORTABLE CHEST 1 VIEW COMPARISON:  05/27/2016. FINDINGS: Cardiomegaly with pulmonary vascular prominence and bilateral interstitial prominence noted consistent with CHF. Similar findings noted on prior exam. Small pleural effusions cannot be excluded. No pneumothorax . IMPRESSION: Congestive heart failure with pulmonary interstitial edema. Small bilateral pleural effusions cannot be excluded. No significant interim change from prior exam. Electronically Signed   By: [place]   On: 05/28/2016 07:14   Dg Chest Port 1 View  Result Date: 05/27/2016 CLINICAL DATA:  Loss of consciousness. EXAM: PORTABLE CHEST 1 VIEW COMPARISON:  05/25/2016 FINDINGS: Again noted is marked cardiomegaly. Diffuse pulmonary infiltrates are again identified, consistent with pulmonary edema and/or infectious process. IMPRESSION: Stable appearance of cardiomegaly and pulmonary infiltrates. Electronically Signed   By: Nolon Nations M.D.   On: 05/27/2016 14:52   Dg Abd Portable 1v  Result Date: 05/28/2016 CLINICAL DATA:  Orogastric tube placement.  Initial encounter. EXAM: PORTABLE ABDOMEN - 1 VIEW COMPARISON:  None. FINDINGS: The patient's orogastric tube is noted ending about the body of the stomach. The bowel gas pattern is difficult to assess given motion artifact. No acute osseous abnormalities are seen. Mild left basilar airspace opacity is seen. IMPRESSION: Orogastric tube noted ending about the body of the stomach. Electronically Signed   By: Garald Balding M.D.   On: 05/28/2016 22:46    PHYSICAL  EXAM General: Intubated/sedated Neck: Thick, JVP difficult, no thyromegaly or thyroid nodule.  Lungs: Distant breath sounds CV: Nondisplaced PMI.  Heart regular S1/S2, no S3/S4, no murmur.  1+ ankle edema.    Abdomen: Soft, nontender, no hepatosplenomegaly, no distention.  Neurologic: Alert and oriented x 3.  Psych: Normal affect. Extremities: No clubbing or cyanosis.   TELEMETRY: Reviewed telemetry pt in NSR, multiple long pauses (longest 10 seconds)  ASSESSMENT AND PLAN: 35 yo with OHS/OSA and HTN was admitted with hypertensive emergency/pulmonary edema and intubated.  She has been extensively diuresed, remains intubated.  1. Acute on chronic diastolic CHF: EF 33-29% on echo with elevated PA pressure.  She has been extensively diuresed but CVP remains 13-14.   - Would continue Lasix 60 mg IV every 8 hrs today.  - Continue acetazolamide, will use 250 mg daily.  - Replete K, awaiting today's labs.  2. OHS/OSA: Remains intubated.  3. HTN: BP currently controlled.  I am concerned about keeping her on beta blocker with long pauses.  Will stop labetalol and start amlodipine  5 mg daily.  Continue hydralazine.  4. Sinus pauses: Suspect this is related to her OHS/OSA.  She had a 10 second pause last night with a coughing spell.  Discussed with Dr Rayann Heman, as these events are likely related to underlying OHS/OSA, would not place PPM.  Would stop beta blocker as above.   Loralie Champagne 06/05/2016 8:41 AM

## 2016-06-05 NOTE — Progress Notes (Signed)
PULMONARY / CRITICAL CARE MEDICINE   Name: [name] [name] MRN: [id number] DOB: [birth year]    ADMISSION DATE:  05/25/2016 CONSULTATION DATE:  05/27/16  REFERRING MD:  Dr. Ree Kida - [facility]  CHIEF COMPLAINT:  Altered Mental Status   BRIEF [name] with PMH of morbid obesity (469 lbs), DM, HTN and tachycardia who presented to [facility] on 10/3 with acute on chronic respiratory failure with hypercarbia and hypoxemia due to diastolic CHF exacerbation and obesity hypoventilation syndrome.  STUDIES:  ECHO 10/4 >> EF 55%, PAP 42  CULTURES:   ANTIBIOTICS:   SIGNIFICANT EVENTS: 10/3  Admit 10/5  Transferred to ICU due to AMS, hypercarbic respiratory failure 05/28/16:  RN reports pt remains on bipap, ntg gtt continues & BP remains elevated in 626'R systolic.  Net negative 5.5 L in last 24 hours with improvement in sr cr  05/29/16 - intubated overnight .  10/7 > 10/14 weaning aggressively 10/14 - attempting wean  SUBJECTIVE/OVERNIGHT/INTERVAL HX Vagal episodes at night with turning diuresing  VITAL SIGNS: BP 124/72 (BP Location: Left Arm)   Pulse 85   Temp 98.8 F (37.1 C) (Oral)   Resp (!) 21   Ht _0  (1.6 m)   Wt (!) 148.8 kg (328 lb)   LMP  (LMP Unknown)   SpO2 100%   BMI 58.10 kg/m   HEMODYNAMICS: CVP:  [11 mmHg-14 mmHg] 14 mmHg  VENTILATOR SETTINGS: Vent Mode: PRVC FiO2 (%):  [40 %-60 %] 50 % Set Rate:  [21 bmp] 21 bmp Vt Set:  [420 mL] 420 mL PEEP:  [5 cmH20] 5 cmH20 Plateau Pressure:  [20 cmH20-29 cmH20] 29 cmH20  INTAKE / OUTPUT: I/O last 3 completed shifts: In: 2768.7 [I.V.:278.7; NG/GT:2340; IV Piggyback:150] Out: 4854 [Urine:7405; Stool:125]  PHYSICAL EXAMINATION: General:  Sedated on vent HENT: NCAT ETT in place PULM: CTA B CV: RRR, no mgr GI: belly soft, nontender Derm: some leg edema  LABS: PULMONARY  Recent Labs Lab 06/01/16 0305 06/01/16 0900 06/02/16 0325 06/03/16 0523 06/04/16 0415 06/05/16 0350  PHART 7.464* 7.389 7.371 7.362 7.364  7.341*  PCO2ART 70.2* 81.8* 83.2* 80.3* 81.1* 82.8*  PO2ART 86.0 86.6 78.4* 70.3* 69.9* 63.2*  HCO3 50.3* 48.1* 47.5* 44.5* 45.2* 43.6*  TCO2 >50  --   --   --   --   --   O2SAT 96.0 94.4 94.8 92.3 92.3 88.5   CBC  Recent Labs Lab 06/02/16 0423 06/03/16 0425 06/04/16 0455  HGB 12.5 12.6 12.6  HCT 44.4 45.0 44.8  WBC 4.5 5.8 7.0  PLT 192 213 193   COAGULATION No results for input(s): INR in the last 168 hours. CARDIAC   No results for input(s): TROPONINI in the last 168 hours. No results for input(s): PROBNP in the last 168 hours.  CHEMISTRY  Recent Labs Lab 05/31/16 0313 05/31/16 2030 06/01/16 0554 06/02/16 0423 06/03/16 0425 06/04/16 0455  NA 143  --  143 146* 148* 150*  K 3.2*  --  4.0 4.0 3.6 3.7  CL 83*  --  88* 93* 97* 99*  CO2 45*  --  44* 41* 44* 43*  GLUCOSE 77  --  116* 86 93 116*  BUN 13  --  15 27* 40* 48*  CREATININE 0.98  --  0.98 1.08* 1.16* 1.25*  CALCIUM 8.1*  --  8.4* 8.5* 8.8* 9.4  MG 1.2* 2.0 1.9 2.1 2.0 2.2  PHOS 3.6  --  4.5 3.1 3.2 3.1   Estimated Creatinine Clearance: 89.4 mL/min (by C-G  formula based on SCr of 1.25 mg/dL (H)).  LIVER No results for input(s): AST, ALT, ALKPHOS, BILITOT, PROT, ALBUMIN, INR in the last 168 hours. INFECTIOUS No results for input(s): LATICACIDVEN, PROCALCITON in the last 168 hours. ENDOCRINE CBG (last 3)   Recent Labs  06/04/16 2327 06/05/16 0413 06/05/16 0733  GLUCAP 105* 120* 138*   10/14 CXR images reviewed> ETT in place, IJ in place, bilateral effusions, atelectasis and edema   DISCUSSION: [name] with PMH of super morbid obesity, HTN, pulmonary hypertension admitted with decompensated diastolic CHF.     PULMONARY A: Acute on Chronic Hypoxemic and Hypercarbic Respiratory Failure Secondary pulmonary hypertension Severe OSA / OHS with acute Decompensation - sleep study in 2012 with up to 75 events per hour with desaturation to 72%  P:   Wean with pressure support Wean  sedation Continue diuresis today VAP prevention O2 as needed to support sats > 92%  CARDIOVASCULAR A:  Hypertensive Urgency > resolved Acute diastolic heart failure Elevated troponin - due to demand ischemia Hx HTN Long pauses on telemetry in evenings P:  Hydralazine 75 mg TID  B-blocker per cardiology Amlodipine per cardiology Goal SBP < 150 per RN Tele  RENAL A:   AKI - improving Hypernatremia Hypokalemia P:   Monitor BMET and UOP Replace electrolytes as needed Replace K Continue diamox, decrease dose, plan d/c tomorrow Continue lasix Continue free water for now  GASTROINTESTINAL A:   Morbid obesity Nutrition P:   TF per nutrition Outpatient obesity clinic referral  HEMATOLOGIC A:   VTE prophylaxis P:  SCD's / Lovenox for DVT prophylaxis CBC daily  INFECTIOUS A:   No indication of infection P:   Monitor for fever  ENDOCRINE A:   No acute issues P:   No interventions required  NEUROLOGIC A:   Acute encephalopathy - in the setting of hypercarbia Depression - family reports pt is in "denial" - seen by Psych 05/28/16  P:   Sedation> titrated to RASS -1 Fentanyl gtt/versed prn    FAMILY  - Updates: No family bedside 10/14  - Inter-disciplinary family meet or Palliative Care meeting due by: 10/12  My cc time 35 minutes   Roselie Awkward, MD [place] [facility] Pager: [phone] Cell: [phone] After 3pm or if no response, call [phone]   06/05/2016 8:25 AM

## 2016-06-06 ENCOUNTER — Inpatient Hospital Stay (HOSPITAL_COMMUNITY): Payer: PRIVATE HEALTH INSURANCE

## 2016-06-06 LAB — BASIC METABOLIC PANEL
Anion gap: 10 (ref 5–15)
BUN: 68 mg/dL — ABNORMAL HIGH (ref 6–20)
CO2: 39 mmol/L — ABNORMAL HIGH (ref 22–32)
Calcium: 9.7 mg/dL (ref 8.9–10.3)
Chloride: 101 mmol/L (ref 101–111)
Creatinine, Ser: 1.14 mg/dL — ABNORMAL HIGH (ref 0.44–1.00)
GFR calc Af Amer: >60 mL/min (ref >60)
GFR calc non Af Amer: >60 mL/min (ref >60)
Glucose, Bld: 114 mg/dL — ABNORMAL HIGH (ref 65–99)
Potassium: 3.8 mmol/L (ref 3.5–5.1)
Sodium: 150 mmol/L — ABNORMAL HIGH (ref 135–145)

## 2016-06-06 LAB — CBC WITH DIFFERENTIAL/PLATELET
Basophils Absolute: 0.1 10*3/uL (ref 0.0–0.1)
Basophils Relative: 1 %
Eosinophils Absolute: 0.5 10*3/uL (ref 0.0–0.7)
Eosinophils Relative: 8 %
HCT: 45.1 % (ref 36.0–46.0)
Hemoglobin: 12.4 g/dL (ref 12.0–15.0)
Lymphocytes Relative: 17 %
Lymphs Abs: 1.1 10*3/uL (ref 0.7–4.0)
MCH: 23.8 pg — ABNORMAL LOW (ref 26.0–34.0)
MCHC: 27.5 g/dL — ABNORMAL LOW (ref 30.0–36.0)
MCV: 86.6 fL (ref 78.0–100.0)
Monocytes Absolute: 0.9 10*3/uL (ref 0.1–1.0)
Monocytes Relative: 13 %
Neutro Abs: 4 10*3/uL (ref 1.7–7.7)
Neutrophils Relative %: 61 %
Platelets: 242 10*3/uL (ref 150–400)
RBC: 5.21 MIL/uL — ABNORMAL HIGH (ref 3.87–5.11)
RDW: 21.7 % — ABNORMAL HIGH (ref 11.5–15.5)
WBC: 6.6 10*3/uL (ref 4.0–10.5)

## 2016-06-06 LAB — GLUCOSE, CAPILLARY
Glucose-Capillary: 126 mg/dL — ABNORMAL HIGH (ref 65–99)
Glucose-Capillary: 116 mg/dL — ABNORMAL HIGH (ref 65–99)
Glucose-Capillary: 122 mg/dL — ABNORMAL HIGH (ref 65–99)
Glucose-Capillary: 123 mg/dL — ABNORMAL HIGH (ref 65–99)
Glucose-Capillary: 114 mg/dL — ABNORMAL HIGH (ref 65–99)

## 2016-06-06 LAB — MAGNESIUM: Magnesium: 2.6 mg/dL — ABNORMAL HIGH (ref 1.7–2.4)

## 2016-06-06 MED ORDER — POTASSIUM CHLORIDE 20 MEQ PO PACK
40.0000 meq | PACK | Freq: Two times a day (BID) | ORAL | Status: DC
  Administered 2016-06-06: 40 meq
  Filled 2016-06-06 (×2): qty 2

## 2016-06-06 MED ORDER — AMLODIPINE BESYLATE 10 MG PO TABS
10.0000 mg | ORAL_TABLET | Freq: Every day | ORAL | Status: DC
Start: 2016-06-06 — End: 2016-06-19
  Administered 2016-06-06 – 2016-06-19 (×12): 10 mg
  Filled 2016-06-06 (×12): qty 1

## 2016-06-06 MED ORDER — POTASSIUM CHLORIDE 20 MEQ/15ML (10%) PO SOLN
40.0000 meq | Freq: Two times a day (BID) | ORAL | Status: DC
  Administered 2016-06-06 – 2016-06-18 (×20): 40 meq
  Filled 2016-06-06 (×21): qty 30

## 2016-06-06 NOTE — Progress Notes (Signed)
RN and RT at bedside. RT observed ETT now at 19cm after readjusting tube. During advancement, pt coughed, gagged, and had a vagal response caughing 9-10sec pause. Pt returned to SB then SR. Pt placed back on full vent support. Fent gtt resumed at 2mg/hr. Will continue to monitor and assess pt closely.

## 2016-06-06 NOTE — Progress Notes (Signed)
Tube out to 19 at lip.  Deflated cuff and advance back to 23Cm.  reinflated cuff .  Plt vagaled down.  Placed back on full support .

## 2016-06-06 NOTE — Progress Notes (Signed)
PULMONARY / CRITICAL CARE MEDICINE   Name: [name] [name] MRN: [id number] DOB: [birth year]    ADMISSION DATE:  05/25/2016 CONSULTATION DATE:  05/27/16  REFERRING MD:  Dr. Ree Kida - [facility]  CHIEF COMPLAINT:  Altered Mental Status   BRIEF: [name] with PMH of morbid obesity (469 lbs), DM, HTN and tachycardia who presented to [facility] on 10/3 with acute on chronic respiratory failure with hypercarbia and hypoxemia due to diastolic CHF exacerbation and obesity hypoventilation syndrome.  STUDIES:  ECHO 10/4 >> EF 55%, PAP 42  CULTURES:   ANTIBIOTICS:   SIGNIFICANT EVENTS: 10/3  Admit 10/5  Transferred to ICU due to AMS, hypercarbic respiratory failure 05/28/16:  RN reports pt remains on bipap, ntg gtt continues & BP remains elevated in 244'L systolic.  Net negative 5.5 L in last 24 hours with improvement in sr cr  05/29/16 - intubated overnight .  10/7 > 10/14 diuresed aggressively 10/14 - attempting wean 10/15 weaning  SUBJECTIVE/OVERNIGHT/INTERVAL HX Vagal episodes at night with turning Diuresing again  VITAL SIGNS: BP 134/80   Pulse 97   Temp 98.7 F (37.1 C) (Oral)   Resp 14   Ht _0  (1.6 m)   Wt (!) 152 kg (335 lb)   LMP  (LMP Unknown)   SpO2 100%   BMI 59.34 kg/m   HEMODYNAMICS: CVP:  [12 mmHg-19 mmHg] 12 mmHg  VENTILATOR SETTINGS: Vent Mode: PSV;BIPAP FiO2 (%):  [40 %-50 %] 50 % Set Rate:  [21 bmp] 21 bmp Vt Set:  [420 mL] 420 mL PEEP:  [5 cmH20] 5 cmH20 Pressure Support:  [16 cmH20] 16 cmH20 Plateau Pressure:  [20 cmH20-23 cmH20] 20 cmH20  INTAKE / OUTPUT: I/O last 3 completed shifts: In: 3518.4 [I.V.:243.4; Other:25; NG/GT:3100; IV Piggyback:150] Out: 7530 [Urine:5580]  PHYSICAL EXAMINATION: General:  Awake on vent HENT: NCAT ETT in place PULM: CTA B CV: RRR, no mgr GI: belly soft, nontender Derm: some leg edema Neuro: doesn't follow comamnds but she is awake  LABS: PULMONARY  Recent Labs Lab 06/01/16 0305 06/01/16 0900 06/02/16 0325  06/03/16 0523 06/04/16 0415 06/05/16 0350  PHART 7.464* 7.389 7.371 7.362 7.364 7.341*  PCO2ART 70.2* 81.8* 83.2* 80.3* 81.1* 82.8*  PO2ART 86.0 86.6 78.4* 70.3* 69.9* 63.2*  HCO3 50.3* 48.1* 47.5* 44.5* 45.2* 43.6*  TCO2 >50  --   --   --   --   --   O2SAT 96.0 94.4 94.8 92.3 92.3 88.5   CBC  Recent Labs Lab 06/03/16 0425 06/04/16 0455 06/06/16 0500  HGB 12.6 12.6 12.4  HCT 45.0 44.8 45.1  WBC 5.8 7.0 6.6  PLT 213 193 242   COAGULATION No results for input(s): INR in the last 168 hours. CARDIAC   No results for input(s): TROPONINI in the last 168 hours. No results for input(s): PROBNP in the last 168 hours.  CHEMISTRY  Recent Labs Lab 06/01/16 0554 06/02/16 0423 06/03/16 0425 06/04/16 0455 06/05/16 0842 06/06/16 0500  NA 143 146* 148* 150* 151* 150*  K 4.0 4.0 3.6 3.7 3.5 3.8  CL 88* 93* 97* 99* 101 101  CO2 44* 41* 44* 43* 42* 39*  GLUCOSE 116* 86 93 116* 126* 114*  BUN 15 27* 40* 48* 61* 68*  CREATININE 0.98 1.08* 1.16* 1.25* 1.26* 1.14*  CALCIUM 8.4* 8.5* 8.8* 9.4 9.6 9.7  MG 1.9 2.1 2.0 2.2 2.5* 2.6*  PHOS 4.5 3.1 3.2 3.1 4.0  --    Estimated Creatinine Clearance: 99.3 mL/min (by C-G formula based on  SCr of 1.14 mg/dL (H)).  LIVER No results for input(s): AST, ALT, ALKPHOS, BILITOT, PROT, ALBUMIN, INR in the last 168 hours. INFECTIOUS No results for input(s): LATICACIDVEN, PROCALCITON in the last 168 hours. ENDOCRINE CBG (last 3)   Recent Labs  06/05/16 2337 06/06/16 0409 06/06/16 0723  GLUCAP 119* 126* 116*   10/14 CXR images reviewed> ETT in place, IJ in place, bilateral effusions, atelectasis and edema   DISCUSSION: [name] with PMH of super morbid obesity, HTN, pulmonary hypertension admitted with decompensated diastolic CHF.     PULMONARY A: Acute on Chronic Hypoxemic and Hypercarbic Respiratory Failure Secondary pulmonary hypertension Severe OSA / OHS with acute Decompensation - sleep study in 2012 with up to 75 events per hour  with desaturation to 72%  P:   Wean with pressure support Wean sedation Continue diuresis today VAP prevention O2 as needed to support sats > 92%  CARDIOVASCULAR A:  Hypertensive Urgency > resolved Acute diastolic heart failure Elevated troponin - due to demand ischemia Hx HTN Long pauses on telemetry in evenings P:  Hydralazine 75 mg TID  B-blocker per cardiology Amlodipine per cardiology Goal SBP < 150 per RN Tele  RENAL A:   AKI - improving Hypernatremia Hypokalemia P:   Monitor BMET and UOP Replace electrolytes as needed Replace K Continue diamox, furosemide per cardiology Continue lasix Continue free water for now  GASTROINTESTINAL A:   Morbid obesity Nutrition P:   TF per nutrition Outpatient obesity clinic referral  HEMATOLOGIC A:   VTE prophylaxis P:  SCD's / Lovenox for DVT prophylaxis CBC daily  INFECTIOUS A:   No indication of infection P:   Monitor for fever  ENDOCRINE A:   No acute issues P:   No interventions required  NEUROLOGIC A:   Acute encephalopathy - in the setting of hypercarbia Depression - family reports pt is in "denial" - seen by Psych 05/28/16  P:   Sedation> titrated to RASS -1 Fentanyl gtt/versed prn    FAMILY  - Updates: Family updated at length on 10/14 and 10/15  - Inter-disciplinary family meet or Palliative Care meeting due by: 10/12  My cc time 35 minutes   Roselie Awkward, MD [place] [facility] Pager: [phone] Cell: [phone] After 3pm or if no response, call [phone]   06/06/2016 11:22 AM

## 2016-06-06 NOTE — Progress Notes (Addendum)
Patient ID: [name] [name], female   DOB: [birth year], 35 y.o.   MRN: [id number]   SUBJECTIVE: Patient remains intubated/sedated.  CVP 13-14 still today.  She was net negative with diuresis yesterday, weight does not appear accurate.   Telemetry continues to show periodic long pauses, she had 11 second sinus pause earlier today.  Long pauses all appear to occur when she coughs or breathes against the vent.   Scheduled Meds: . acetaZOLAMIDE  250 mg Oral Daily  . amLODipine  5 mg Per Tube Daily  . chlorhexidine gluconate (MEDLINE KIT)  15 mL Mouth Rinse BID  . docusate  100 mg Per Tube BID  . enoxaparin (LOVENOX) injection  70 mg Subcutaneous Q24H  . famotidine (PEPCID) IV  20 mg Intravenous Q12H  . feeding supplement (PRO-STAT SUGAR FREE 64)  60 mL Per Tube TID  . feeding supplement (VITAL HIGH PROTEIN)  1,000 mL Per Tube Q24H  . free water  250 mL Per Tube Q4H  . furosemide  60 mg Intravenous Q8H  . hydrALAZINE  75 mg Oral Q8H  . isosorbide dinitrate  30 mg Oral BID  . magnesium hydroxide  30 mL Per Tube Daily  . mouth rinse  15 mL Mouth Rinse 10 times per day  . polyethylene glycol  17 g Oral Daily  . sodium chloride flush  10-40 mL Intracatheter Q12H  . sodium chloride flush  3 mL Intravenous Q12H  . THROMBI-PAD  1 each Topical Once   Continuous Infusions: . fentaNYL infusion INTRAVENOUS Stopped (06/06/16 0745)   PRN Meds:.acetaminophen (TYLENOL) oral liquid 160 mg/5 mL, fentaNYL (SUBLIMAZE) injection, hydrALAZINE, midazolam, ondansetron **OR** ondansetron (ZOFRAN) IV, sodium chloride flush    Vitals:   06/06/16 0715 06/06/16 0745 06/06/16 0755 06/06/16 0800  BP:   (!) 144/93 (!) 151/94  Pulse: 92 91 94 (!) 103  Resp: (!) _0 Temp:    98.7 F (37.1 C)  TempSrc:    Oral  SpO2: 97% 90% 96% 99%  Weight:      Height:        Intake/Output Summary (Last 24 hours) at 06/06/16 0823 Last data filed at 06/06/16 0800  Gross per 24 hour  Intake          2794.17 ml    Output             3550 ml  Net          -755.83 ml    LABS: Basic Metabolic Panel:  Recent Labs  06/04/16 0455 06/05/16 0842 06/06/16 0500  NA 150* 151* 150*  K 3.7 3.5 3.8  CL 99* 101 101  CO2 43* 42* 39*  GLUCOSE 116* 126* 114*  BUN 48* 61* 68*  CREATININE 1.25* 1.26* 1.14*  CALCIUM 9.4 9.6 9.7  MG 2.2 2.5* 2.6*  PHOS 3.1 4.0  --    Liver Function Tests: No results for input(s): AST, ALT, ALKPHOS, BILITOT, PROT, ALBUMIN in the last 72 hours. No results for input(s): LIPASE, AMYLASE in the last 72 hours. CBC:  Recent Labs  06/04/16 0455 06/06/16 0500  WBC 7.0 6.6  NEUTROABS  --  4.0  HGB 12.6 12.4  HCT 44.8 45.1  MCV 85.7 86.6  PLT 193 242   Cardiac Enzymes: No results for input(s): CKTOTAL, CKMB, CKMBINDEX, TROPONINI in the last 72 hours. BNP: Invalid input(s): POCBNP D-Dimer: No results for input(s): DDIMER in the last 72 hours. Hemoglobin A1C: No results for input(s): HGBA1C in the last  72 hours. Fasting Lipid Panel: No results for input(s): CHOL, HDL, LDLCALC, TRIG, CHOLHDL, LDLDIRECT in the last 72 hours. Thyroid Function Tests: No results for input(s): TSH, T4TOTAL, T3FREE, THYROIDAB in the last 72 hours.  Invalid input(s): FREET3 Anemia Panel: No results for input(s): VITAMINB12, FOLATE, FERRITIN, TIBC, IRON, RETICCTPCT in the last 72 hours.  RADIOLOGY: Dg Chest 2 View  Result Date: 05/25/2016 CLINICAL DATA:  Chest pain and shortness of breath, 2 days duration. EXAM: CHEST  2 VIEW COMPARISON:  None. FINDINGS: Cardiac silhouette is markedly enlarged consistent with cardiomegaly and/or pericardial fluid. There is pulmonary venous hypertension with interstitial and alveolar pulmonary edema. No pleural effusion at this time. Bony structures are unremarkable. IMPRESSION: Enlarged cardiac silhouette consistent with cardiomegaly and/or pericardial fluid. Congestive heart failure with interstitial and alveolar edema. Electronically Signed   By: Nelson Chimes M.D.   On: 05/25/2016 13:14   Ct Angio Chest Pe W And/or Wo Contrast  Result Date: 05/25/2016 CLINICAL DATA:  Shortness of Breath and elevated D-dimer EXAM: CT ANGIOGRAPHY CHEST WITH CONTRAST TECHNIQUE: Multidetector CT imaging of the chest was performed using the standard protocol during bolus administration of intravenous contrast. Multiplanar CT image reconstructions and MIPs were obtained to evaluate the vascular anatomy. CONTRAST:  80 mL Isovue 370 nonionic COMPARISON:  Chest radiograph May 25, 2016 FINDINGS: Cardiovascular: There is no central vessel pulmonary embolus. Due to attenuation artifact due to the patient's large size, assessment of the peripheral pulmonary arterial vessels is less than optimal. There is no obvious pulmonary embolus more distally allowing for a degree of attenuation artifact which is symmetric bilaterally. There is no demonstrable thoracic aortic aneurysm or dissection. The main pulmonary outflow tract measures 4.9 cm which is felt to be compatible with a degree of pulmonary arterial hypertension. Visualized great vessels appear unremarkable. There is cardiomegaly. There is pericardial fluid along the right side of the heart laterally. Mediastinum/Nodes: Thyroid appears unremarkable. There is no appreciable thoracic adenopathy. Lungs/Pleura: There is pulmonary edema throughout the lungs bilaterally, most pronounced in the perihilar and lower lobe regions but also moderate in the inferior lingula and right middle lobe regions. There is no well-defined airspace consolidation. There are no appreciable pleural effusions. Upper Abdomen: There is reflux of contrast into the inferior vena cava and pulmonary veins. Visualized upper abdominal structures otherwise appear unremarkable. Musculoskeletal: There is degenerative change in the thoracic spine with diffuse idiopathic skeletal hyperostosis. No blastic or lytic bone lesions are evident. Review of the MIP images confirms the  above findings. IMPRESSION: No pulmonary embolus demonstrable. Visualization of the peripheral pulmonary arteries is less than optimal due to attenuation artifact from patient's large size. Main pulmonary outflow tract enlargement consistent with pulmonary arterial hypertension. Cardiomegaly with pulmonary edema consistent with congestive heart failure. Small pericardial effusion in the right heart region laterally. No pleural effusions evident. No adenopathy. Reflux of contrast in the inferior vena cava and hepatic veins is felt to be indicative of increased right heart pressure. Diffuse idiopathic skeletal hyperostosis in the thoracic spine. Electronically Signed   By: Lowella Grip III M.D.   On: 05/25/2016 14:55   Dg Chest Port 1 View  Result Date: 06/06/2016 CLINICAL DATA:  Acute respiratory failure. EXAM: PORTABLE CHEST 1 VIEW COMPARISON:  June 05, 2016 FINDINGS: Stable support apparatus including the ETT. No pneumothorax. Stable cardiomegaly. Opacity in the medial right base is focal in similar to recent studies. Mild interstitial prominence, likely superimposed edema. IMPRESSION: 1. Persistent edema. 2. Persistent medial right basilar  infiltrate. 3. Stable support apparatus. Electronically Signed   By: Dorise Bullion III M.D   On: 06/06/2016 07:21   Dg Chest Port 1 View  Result Date: 06/05/2016 CLINICAL DATA:  Ventilator dependence. EXAM: PORTABLE CHEST 1 VIEW COMPARISON:  06/04/2016 FINDINGS: 0440 hours. Endotracheal tube tip is 2.8 cm above the base of the carina. Right IJ central line tip overlies the mid SVC level. The NG tube passes into the stomach although the distal tip position is not included on the film. Lung volumes remain low with diffuse interstitial and bilateral airspace disease. The cardio pericardial silhouette is enlarged. Telemetry leads overlie the chest. IMPRESSION: No substantial change. Cardiomegaly with interstitial in diffuse bilateral airspace disease compatible  with edema or pneumonia. Electronically Signed   By: Misty Stanley M.D.   On: 06/05/2016 07:52   Dg Chest Port 1 View  Result Date: 06/04/2016 CLINICAL DATA:  Intubation. History of hypertension and diabetes. Sinus tachycardia EXAM: PORTABLE CHEST 1 VIEW COMPARISON:  06/04/2016, 06/03/2016 FINDINGS: Endotracheal tube tip is approximately 9 mm superior to the carina. Esophageal tube tip is below the diaphragm but is not included. Right-sided central venous catheter tip poorly visualized, it appears to overlie the cavoatrial region. There are low lung volumes. There is interval increase in bibasilar airspace opacification. There are tiny bilateral effusions. There is stable moderate enlargement of the cardiomediastinal rim. No pneumothorax. IMPRESSION: 1. Tip of the endotracheal tube is near the carina. 2. Markedly low lung volumes with increasing bibasilar atelectasis or pneumonia. Suspect small effusions. 3. Stable moderate to marked cardiomegaly with central vascular congestion. Electronically Signed   By: Donavan Foil M.D.   On: 06/04/2016 14:04   Dg Chest Port 1 View  Result Date: 06/04/2016 CLINICAL DATA:  Endotracheal tube EXAM: PORTABLE CHEST 1 VIEW COMPARISON:  Yesterday FINDINGS: Endotracheal tube tip at the clavicular heads. An orogastric tube reaches stomach at least. Right IJ central line with tip at the upper cavoatrial junction. Low lung volumes with diffuse perihilar and basilar opacity. Marked cardiopericardial enlargement. No effusion or pneumothorax. IMPRESSION: 1. Stable positioning of tubes and central line. 2. Low volume chest with bilateral atelectasis or pneumonia. 3. Cardiomegaly and at least pulmonary venous congestion. Electronically Signed   By: Monte Fantasia M.D.   On: 06/04/2016 07:13   Dg Chest Port 1 View  Result Date: 06/03/2016 CLINICAL DATA:  Hypoxia EXAM: PORTABLE CHEST 1 VIEW COMPARISON:  June 02, 2016 FINDINGS: Endotracheal tube tip is 4.6 cm above the carina.  Nasogastric tube tip and side port are below the diaphragm. Central catheter tip is in the superior vena cava. No pneumothorax. There is persistent interstitial pulmonary edema with patchy alveolar opacity in the bases, likely edema. There is cardiomegaly with pulmonary venous hypertension. There is a small right pleural effusion. No adenopathy evident. IMPRESSION: Than catheter positions as described without pneumothorax. Evidence of a degree of congestive heart failure. There appears to be slightly less alveolar opacity in the left perihilar region compared to 1 day prior. Lungs and cardiac silhouette otherwise unchanged. Electronically Signed   By: Lowella Grip III M.D.   On: 06/03/2016 07:09   Dg Chest Port 1 View  Result Date: 06/02/2016 CLINICAL DATA:  intubated EXAM: PORTABLE CHEST 1 VIEW COMPARISON:  06/01/2016 FINDINGS: Cardiomegaly again noted. Central vascular congestion and diffuse airspace disease bilaterally again noted consistent with pulmonary edema. Stable endotracheal and NG tube position. There is right IJ central line with tip in right atrium. No pneumothorax. IMPRESSION: Central vascular  congestion and diffuse airspace disease bilaterally again noted consistent with pulmonary edema. Stable endotracheal and NG tube position. There is right IJ central line with tip in right atrium. No pneumothorax. Electronically Signed   By: Lahoma Crocker M.D.   On: 06/02/2016 07:57   Dg Chest Port 1 View  Result Date: 06/01/2016 CLINICAL DATA:  Central line insertion EXAM: PORTABLE CHEST 1 VIEW COMPARISON:  06/01/2016 FINDINGS: Endotracheal tube and NG tube are unchanged. Right central line is been placed with the tip at the cavoatrial junction. No pneumothorax. There is cardiomegaly with diffuse bilateral airspace disease, likely edema/ CHF. Low lung volumes with bibasilar atelectasis. No visible effusions. IMPRESSION: Right central line tip at the cavoatrial junction.  No pneumothorax. Mild edema/  CHF.  Low lung volumes. Electronically Signed   By: Rolm Baptise M.D.   On: 06/01/2016 10:06   Dg Chest Port 1 View  Result Date: 06/01/2016 CLINICAL DATA:  Malignant hypertension, CHF, pulmonary edema, morbid obesity. EXAM: PORTABLE CHEST 1 VIEW COMPARISON:  Portable chest x-ray of May 31, 2016 FINDINGS: The lungs remain hypoinflated. The interstitial markings remain increased. The pulmonary vascularity remains engorged. The cardiac silhouette is markedly enlarged. The hemidiaphragms remain obscured. The endotracheal tube tip lies approximately 3.5 cm above the carina. The esophagogastric tube tip projects below the inferior margin of the image. IMPRESSION: CHF with pulmonary interstitial and alveolar edema. Probable small bilateral pleural effusions. Basilar pneumonia is not excluded. Allowing for differences in positioning there has not been significant interval change. The support tubes are in reasonable position. Electronically Signed   By: David  Martinique M.D.   On: 06/01/2016 07:35   Dg Chest Port 1 View  Result Date: 05/31/2016 CLINICAL DATA:  ET tube placement EXAM: PORTABLE CHEST 1 VIEW COMPARISON:  05/30/2016 FINDINGS: Endotracheal tube with the tip 2.5 cm above the carina. Nasogastric tube coursing below the diaphragm. Bilateral interstitial and alveolar airspace opacities with enlargement of the central pulmonary vasculature. No pneumothorax. Trace bilateral pleural effusions. Stable cardiomegaly. No acute osseous abnormality. IMPRESSION: 1. Endotracheal tube with the tip 2.5 cm above the carina. 2. Bilateral interstitial and alveolar airspace opacities with cardiomegaly compatible with CHF versus multilobar pneumonia. Electronically Signed   By: Kathreen Devoid   On: 05/31/2016 07:03   Dg Chest Port 1 View  Result Date: 05/30/2016 CLINICAL DATA:  Shortness of Breath EXAM: PORTABLE CHEST 1 VIEW COMPARISON:  May 29, 2016 FINDINGS: Endotracheal tube tip is 3.0 cm above the carina.  Nasogastric tube tip and side port below the diaphragm. No pneumothorax. There is cardiomegaly with pulmonary venous hypertension. There are bilateral pleural effusions with patchy interstitial alveolar edema bilaterally, primarily in the bases. There are air bronchograms in both lower lobes. No adenopathy evident. IMPRESSION: Tube positions as described without pneumothorax. Evidence congestive heart failure. Question superimposed pneumonia in the bases. The appearance is stable compared to 1 day prior. Electronically Signed   By: Lowella Grip III M.D.   On: 05/30/2016 08:21   Dg Chest Port 1 View  Result Date: 05/29/2016 CLINICAL DATA:  Acute respiratory failure with hypoxia. On ventilator. EXAM: PORTABLE CHEST 1 VIEW COMPARISON:  05/28/2016 FINDINGS: Endotracheal tube and nasogastric tube remain in appropriate position. Worsening diffuse bilateral pulmonary airspace disease is seen since previous study. Heart size remains stable. No pneumothorax visualized. IMPRESSION: Interval worsening of diffuse bilateral pulmonary airspace disease. Electronically Signed   By: Earle Gell M.D.   On: 05/29/2016 11:16   Dg Chest [facility] 1 View  Result  Date: 05/28/2016 CLINICAL DATA:  Endotracheal tube adjustment.  Initial encounter. EXAM: PORTABLE CHEST 1 VIEW COMPARISON:  Chest radiograph performed earlier today at 9:21 p.m. FINDINGS: The endotracheal tube is seen ending approximately 1 cm above the carina. This could be retracted 2 - 3 cm. The enteric tube is noted extending below the diaphragm. The lungs are mildly hypoexpanded. Vascular congestion and vascular crowding are seen. Increased interstitial markings raise concern for mild pulmonary edema. No pleural effusion or pneumothorax is seen. The cardiomediastinal silhouette is enlarged. No acute osseous abnormalities are identified. IMPRESSION: 1. Endotracheal tube seen ending 1 cm above the carina. This could be retracted 2 - 3 cm. 2. Lungs mildly hypoexpanded.  Vascular congestion and cardiomegaly. Increased interstitial markings raise concern for mild pulmonary edema. Electronically Signed   By: Garald Balding M.D.   On: 05/28/2016 22:44   Dg Chest Port 1 View  Result Date: 05/28/2016 CLINICAL DATA:  Endotracheal tube and orogastric tube placement. EXAM: PORTABLE CHEST 1 VIEW COMPARISON:  Chest radiograph 05/28/2016 at 4:57 a.m. FINDINGS: Endotracheal tube tip is just below the level of the clavicular heads, 1.5 cm above the inferior margin of the carina. Orogastric 2 courses below the diaphragm and beyond the field of view. There is unchanged cardiomegaly with diffuse pulmonary edema. Small left pleural effusion. No pneumothorax visualized. IMPRESSION: 1. Endotracheal tube tip just below the level of the clavicular heads, approximately 1.5 cm above the inferior margin of the carina. 2. Unchanged cardiomegaly and diffuse pulmonary edema. Electronically Signed   By: Ulyses Jarred M.D.   On: 05/28/2016 22:01   Dg Chest Port 1 View  Result Date: 05/28/2016 CLINICAL DATA:  Acute CHF. EXAM: PORTABLE CHEST 1 VIEW COMPARISON:  05/27/2016. FINDINGS: Cardiomegaly with pulmonary vascular prominence and bilateral interstitial prominence noted consistent with CHF. Similar findings noted on prior exam. Small pleural effusions cannot be excluded. No pneumothorax . IMPRESSION: Congestive heart failure with pulmonary interstitial edema. Small bilateral pleural effusions cannot be excluded. No significant interim change from prior exam. Electronically Signed   By: [place]   On: 05/28/2016 07:14   Dg Chest Port 1 View  Result Date: 05/27/2016 CLINICAL DATA:  Loss of consciousness. EXAM: PORTABLE CHEST 1 VIEW COMPARISON:  05/25/2016 FINDINGS: Again noted is marked cardiomegaly. Diffuse pulmonary infiltrates are again identified, consistent with pulmonary edema and/or infectious process. IMPRESSION: Stable appearance of cardiomegaly and pulmonary infiltrates.  Electronically Signed   By: Nolon Nations M.D.   On: 05/27/2016 14:52   Dg Abd Portable 1v  Result Date: 05/28/2016 CLINICAL DATA:  Orogastric tube placement.  Initial encounter. EXAM: PORTABLE ABDOMEN - 1 VIEW COMPARISON:  None. FINDINGS: The patient's orogastric tube is noted ending about the body of the stomach. The bowel gas pattern is difficult to assess given motion artifact. No acute osseous abnormalities are seen. Mild left basilar airspace opacity is seen. IMPRESSION: Orogastric tube noted ending about the body of the stomach. Electronically Signed   By: Garald Balding M.D.   On: 05/28/2016 22:46    PHYSICAL EXAM General: Intubated/sedated Neck: Thick, JVP difficult, no thyromegaly or thyroid nodule.  Lungs: Distant breath sounds CV: Nondisplaced PMI.  Heart regular S1/S2, no S3/S4, no murmur.  1+ ankle edema.    Abdomen: Soft, nontender, no hepatosplenomegaly, no distention.  Neurologic: Alert and oriented x 3.  Psych: Normal affect. Extremities: No clubbing or cyanosis.   TELEMETRY: Reviewed telemetry pt in NSR, multiple pauses (longest 11 seconds)  ASSESSMENT AND PLAN: 35 yo with  OHS/OSA and HTN was admitted with hypertensive emergency/pulmonary edema and intubated.  She has been extensively diuresed, remains intubated.  1. Acute on chronic diastolic CHF: EF 30-15% on echo with elevated PA pressure.  She has been extensively diuresed but CVP remains 13-14.  BUN noted to be rising.  - Would continue Lasix 60 mg IV every 8 hrs today, she was net negative but not markedly so with this regimen.  Would aim for this again today.  - Continue acetazolamide, will use 250 mg daily.  - Continue to replace K.  2. OHS/OSA: Remains intubated. Attempt wean today.  3. HTN: BP mildly elevated.  Can increase amlodipine to 10 mg daily.   4. Sinus pauses: Suspect this is related to her OHS/OSA.  She had an 11 second pause last night while breathing against the vent.  Her long pauses all appear  to be associated with coughing and straining against vent.   Discussed with Dr Rayann Heman, as these events are likely related to underlying OHS/OSA, would not place PPM.  I stopped her beta blocker.   5. Hypernatremia: Getting free water boluses.   Loralie Champagne 06/06/2016 8:23 AM

## 2016-06-07 ENCOUNTER — Inpatient Hospital Stay (HOSPITAL_COMMUNITY): Payer: PRIVATE HEALTH INSURANCE

## 2016-06-07 DIAGNOSIS — I455 Other specified heart block: Secondary | ICD-10-CM | POA: Diagnosis present

## 2016-06-07 DIAGNOSIS — G4730 Sleep apnea, unspecified: Secondary | ICD-10-CM

## 2016-06-07 HISTORY — DX: Other specified heart block: I45.5

## 2016-06-07 LAB — CBC WITH DIFFERENTIAL/PLATELET
Basophils Absolute: 0.1 10*3/uL (ref 0.0–0.1)
Basophils Relative: 1 %
Eosinophils Absolute: 0.6 10*3/uL (ref 0.0–0.7)
Eosinophils Relative: 10 %
HCT: 45.3 % (ref 36.0–46.0)
Hemoglobin: 12.7 g/dL (ref 12.0–15.0)
Lymphocytes Relative: 13 %
Lymphs Abs: 0.8 10*3/uL (ref 0.7–4.0)
MCH: 23.8 pg — ABNORMAL LOW (ref 26.0–34.0)
MCHC: 28 g/dL — ABNORMAL LOW (ref 30.0–36.0)
MCV: 85 fL (ref 78.0–100.0)
Monocytes Absolute: 1.1 10*3/uL — ABNORMAL HIGH (ref 0.1–1.0)
Monocytes Relative: 18 %
Neutro Abs: 3.5 10*3/uL (ref 1.7–7.7)
Neutrophils Relative %: 58 %
Platelets: 246 10*3/uL (ref 150–400)
RBC: 5.33 MIL/uL — ABNORMAL HIGH (ref 3.87–5.11)
RDW: 21.7 % — ABNORMAL HIGH (ref 11.5–15.5)
WBC: 6.1 10*3/uL (ref 4.0–10.5)

## 2016-06-07 LAB — GLUCOSE, CAPILLARY
Glucose-Capillary: 125 mg/dL — ABNORMAL HIGH (ref 65–99)
Glucose-Capillary: 153 mg/dL — ABNORMAL HIGH (ref 65–99)
Glucose-Capillary: 118 mg/dL — ABNORMAL HIGH (ref 65–99)
Glucose-Capillary: 134 mg/dL — ABNORMAL HIGH (ref 65–99)
Glucose-Capillary: 159 mg/dL — ABNORMAL HIGH (ref 65–99)
Glucose-Capillary: 161 mg/dL — ABNORMAL HIGH (ref 65–99)

## 2016-06-07 LAB — BASIC METABOLIC PANEL
Anion gap: 8 (ref 5–15)
BUN: 69 mg/dL — ABNORMAL HIGH (ref 6–20)
CO2: 41 mmol/L — ABNORMAL HIGH (ref 22–32)
Calcium: 9.9 mg/dL (ref 8.9–10.3)
Chloride: 104 mmol/L (ref 101–111)
Creatinine, Ser: 1.06 mg/dL — ABNORMAL HIGH (ref 0.44–1.00)
GFR calc Af Amer: >60 mL/min (ref >60)
GFR calc non Af Amer: >60 mL/min (ref >60)
Glucose, Bld: 145 mg/dL — ABNORMAL HIGH (ref 65–99)
Potassium: 3.8 mmol/L (ref 3.5–5.1)
Sodium: 153 mmol/L — ABNORMAL HIGH (ref 135–145)

## 2016-06-07 MED ORDER — DEXAMETHASONE SODIUM PHOSPHATE 4 MG/ML IJ SOLN
4.0000 mg | Freq: Four times a day (QID) | INTRAMUSCULAR | Status: AC
  Administered 2016-06-07 – 2016-06-09 (×8): 4 mg via INTRAVENOUS
  Filled 2016-06-07 (×8): qty 1

## 2016-06-07 MED ORDER — DIPHENHYDRAMINE HCL 50 MG/ML IJ SOLN
25.0000 mg | Freq: Four times a day (QID) | INTRAMUSCULAR | Status: AC
  Administered 2016-06-07 – 2016-06-09 (×8): 25 mg via INTRAVENOUS
  Filled 2016-06-07 (×8): qty 0.5

## 2016-06-07 NOTE — Progress Notes (Signed)
Patient Name: [name] [name] Date of Encounter: 06/07/2016  Primary Cardiologist: [facility] Problem List     Principal Problem:   Acute congestive heart failure [facility]) Active Problems:   Malignant hypertension   Morbid obesity (HCC)   Sleep apnea   Renal insufficiency   Acute pulmonary edema (HCC)   Abdominal pain   Hepatic congestion   Sinus tachycardia   Pericardial effusion   Pulmonary hypertension   Acute respiratory failure with hypoxemia (HCC)   Difficult intravenous access     Subjective   Remains intubated. Sedate. Mother and daughter at bedside  Inpatient Medications    Scheduled Meds: . acetaZOLAMIDE  250 mg Oral Daily  . amLODipine  10 mg Per Tube Daily  . chlorhexidine gluconate (MEDLINE KIT)  15 mL Mouth Rinse BID  . docusate  100 mg Per Tube BID  . enoxaparin (LOVENOX) injection  70 mg Subcutaneous Q24H  . famotidine (PEPCID) IV  20 mg Intravenous Q12H  . feeding supplement (PRO-STAT SUGAR FREE 64)  60 mL Per Tube TID  . feeding supplement (VITAL HIGH PROTEIN)  1,000 mL Per Tube Q24H  . free water  250 mL Per Tube Q4H  . furosemide  60 mg Intravenous Q8H  . hydrALAZINE  75 mg Oral Q8H  . isosorbide dinitrate  30 mg Oral BID  . magnesium hydroxide  30 mL Per Tube Daily  . mouth rinse  15 mL Mouth Rinse 10 times per day  . polyethylene glycol  17 g Oral Daily  . potassium chloride  40 mEq Per Tube BID  . sodium chloride flush  10-40 mL Intracatheter Q12H  . sodium chloride flush  3 mL Intravenous Q12H  . THROMBI-PAD  1 each Topical Once   Continuous Infusions: . fentaNYL infusion INTRAVENOUS 50 mcg/hr (06/06/16 1200)   PRN Meds:.acetaminophen (TYLENOL) oral liquid 160 mg/5 mL, fentaNYL (SUBLIMAZE) injection, hydrALAZINE, midazolam, ondansetron **OR** ondansetron (ZOFRAN) IV, sodium chloride flush   Vital Signs    Vitals:   06/07/16 0009 06/07/16 0307 06/07/16 0400 06/07/16 0442  BP: 129/80  126/74   Pulse: 99  (!) 105   Resp: (!) 21   (!) 21   Temp: 99.7 F (37.6 C)  98.9 F (37.2 C)   TempSrc: Oral  Oral   SpO2: 100% 100% 100%   Weight:    (!) 338 lb (153.3 kg)  Height:        Intake/Output Summary (Last 24 hours) at 06/07/16 0620 Last data filed at 06/07/16 0000  Gross per 24 hour  Intake             2373 ml  Output             2500 ml  Net             -127 ml   Filed Weights   06/05/16 0500 06/06/16 0500 06/07/16 0442  Weight: (!) 328 lb (148.8 kg) (!) 335 lb (152 kg) (!) 338 lb (153.3 kg)    Physical Exam    GEN: Sedate on vent HEENT: Grossly normal. ETT. Neck: Supple, no JVD, carotid bruits, or masses. Cardiac: RRR, no murmurs, rubs, or gallops. No clubbing, cyanosis, edema.  Radials/DP/PT 2+ and equal bilaterally.  Respiratory:  Respirations regular increased effort noted, clear to auscultation bilaterally, decreased BS. GI: Soft, nontender, nondistended, BS + x 4. obese MS: no deformity or atrophy. Skin: warm and dry, no rash. Neuro:  Strength and sensation are intact. Psyc: sedate  Labs  CBC  Recent Labs  06/06/16 0500 06/07/16 0454  WBC 6.6 6.1  NEUTROABS 4.0 PENDING  HGB 12.4 12.7  HCT 45.1 45.3  MCV 86.6 85.0  PLT 242 PENDING   Basic Metabolic Panel  Recent Labs  06/05/16 0842 06/06/16 0500 06/07/16 0454  NA 151* 150* 153*  K 3.5 3.8 3.8  CL 101 101 104  CO2 42* 39* 41*  GLUCOSE 126* 114* 145*  BUN 61* 68* 69*  CREATININE 1.26* 1.14* 1.06*  CALCIUM 9.6 9.7 9.9  MG 2.5* 2.6*  --   PHOS 4.0  --   --     Telemetry    Has had up to 11 second pauses with cough, strain (vagal/OSA, reviewed with Dr. Rayann Heman) - Personally Reviewed  ECG    NSR, PRWP- Personally Reviewed  Radiology    Dg Chest Port 1 View  Result Date: 06/06/2016 CLINICAL DATA:  Acute respiratory failure. EXAM: PORTABLE CHEST 1 VIEW COMPARISON:  June 05, 2016 FINDINGS: Stable support apparatus including the ETT. No pneumothorax. Stable cardiomegaly. Opacity in the medial right base is focal in  similar to recent studies. Mild interstitial prominence, likely superimposed edema. IMPRESSION: 1. Persistent edema. 2. Persistent medial right basilar infiltrate. 3. Stable support apparatus. Electronically Signed   By: Dorise Bullion III M.D   On: 06/06/2016 07:21     Cardiac Studies   CT scan chest: No PE, but dilated pulmonary artery (89m).  ECHO: - Left ventricle: The cavity size was normal. Systolic function was   normal. The estimated ejection fraction was in the range of 50%   to 55%. Regional wall motion abnormalities cannot be excluded.   The study is not technically sufficient to allow evaluation of LV   diastolic function. - Left atrium: The atrium was mildly dilated. - Right atrium: The atrium was mildly dilated. - Pulmonary arteries: Systolic pressure was mildly increased. PA   peak pressure: 42 mm Hg (S).  Impressions:  - Extremely limited; definity used; probable low normal LV sstolci   function; mild biatrial enlargement; mild TR; mildly elevated   pulmonary pressure; suggest MUGA or cardiac MRI if clinically   indicated.  Patient Profile     35year old with super morbid obesity, secondary pulmonary hypertension, acute diastolic heart failure, OSA/Obesity hypoventilation, hypercarbic respiratory failure.  Assessment & Plan    Acute diastolic HF  -ECHO with normal EF but mild pulm HTN.   - Would continue with lasix and watch for change in creat.   - Difficult to assess fluid status. Has diuresed -424Liters.   Obesity  - Secondary pulm htn  - weight loss (bed weights not precise)  - CPAP/BIPAP eventually but now ETT  Secondary pulm htn  - as above  - weight loss  - BP control (should improve with continued diuresis)  - small pericardial effusion should be of no clinical concern.   Elevated troponin/ demand ischemia  - low level troponin secondary to acute heart strain/failure.   Essential hypertension  - BP improved, see meds.   Sinus Pause    - 11 second - Vagal/OSA. No pacer indicated, discussed with Dr. ARayann Heman EP.   - stopped metoprolol  Signed, MCandee Furbish MD  06/07/2016, 6:20 AM

## 2016-06-07 NOTE — Progress Notes (Signed)
Patient's tongue swollen and per family, patient's tongue began swelling on Friday 10/13.  RN reviewed patient's medications with pharmacy, pharmacy did not see any new medication with a common side effect of angioedema.  New orders received from MD.

## 2016-06-07 NOTE — Progress Notes (Signed)
Nutrition Follow-up  DOCUMENTATION CODES:   Morbid obesity  INTERVENTION:   Continue:  Vital High Protein @ 25 ml/hr (600 ml/day) Prostat to 60 ml TID Provides: 1200 kcal, 142 grams protein, and 501 ml H2O  NUTRITION DIAGNOSIS:   Inadequate oral intake related to inability to eat as evidenced by NPO status. Ongoing.   GOAL:   Provide needs based on ASPEN/SCCM guidelines Met.   MONITOR:   Vent status, Labs, Weight trends, Skin, I & O's  ASSESSMENT:   [name] with PMH of morbid obesity (469 lbs), DM, HTN and tachycardia who presented to [facility] on 10/3 with reports of abdominal bloating and heavy abdominal pressure.    Weight is down 54 lb from admission weight and 131 lb since pt's highest weight this admission. She is -44 L. Pt is also on hypocaloric/high protein nutrition support.   Patient is currently intubated on ventilator support  Medications reviewed and include: colace, MOM, miralax, KCl,  Free water 250 ml every 4 hours = 1500 ml  Labs reviewed: Na 153 CBG's: 118-153   Diet Order:  Diet NPO time specified  Skin:  Wound (see comment) (wound on R and L tibial)  Last BM:  10/16 (100 ml x 24 hours) via rectal tube (placed 10/12)  Height:   Ht Readings from Last 1 Encounters:  06/01/16 5' 3" (1.6 m)    Weight:   Wt Readings from Last 1 Encounters:  06/07/16 (!) 338 lb (153.3 kg)    Ideal Body Weight:  52.27 kg  BMI:  Body mass index is 59.87 kg/m.  Estimated Nutritional Needs:   Kcal:  1150-1307  Protein:  >/= 130 grams  Fluid:  Per MD  EDUCATION NEEDS:   Education needs no appropriate at this time  [place], [place], [place] Pager [phone] After Hours Pager

## 2016-06-07 NOTE — Progress Notes (Addendum)
PULMONARY / CRITICAL CARE MEDICINE   Name: [name] [name] MRN: [id number] DOB: October 19, 1980    ADMISSION DATE:  05/25/2016 CONSULTATION DATE:  05/27/16  REFERRING MD:  Dr. Ree Kida - [facility]  CHIEF COMPLAINT:  Altered Mental Status   BRIEF: [name] with PMH of morbid obesity (469 lbs), DM, HTN and tachycardia who presented to [facility] on 10/3 with acute on chronic respiratory failure with hypercarbia and hypoxemia due to diastolic CHF exacerbation and obesity hypoventilation syndrome.  STUDIES:  ECHO 10/4 >> EF 55%, PAP 42  CULTURES:   ANTIBIOTICS:   SIGNIFICANT EVENTS: 10/3  Admit 10/5  Transferred to ICU due to AMS, hypercarbic respiratory failure 05/28/16:  RN reports pt remains on bipap, ntg gtt continues & BP remains elevated in 709'G systolic.  Net negative 5.5 L in last 24 hours with improvement in sr cr  05/29/16 - intubated overnight .  10/7 > 10/14 diuresed aggressively 10/14 - attempting wean 10/15 weaning  SUBJECTIVE Diuresing again Doing PST but not awake enough Did vagal episode last night.  Pt's tongue is swollen > noted since Friday  VITAL SIGNS: BP (!) 149/89   Pulse 99   Temp 99.1 F (37.3 C) (Oral)   Resp (!) 21   Ht _0  (1.6 m)   Wt (!) 153.3 kg (338 lb)   LMP  (LMP Unknown)   SpO2 100%   BMI 59.87 kg/m   HEMODYNAMICS: CVP:  [14 mmHg-20 mmHg] 20 mmHg  VENTILATOR SETTINGS: Vent Mode: PRVC FiO2 (%):  [40 %-50 %] 40 % Set Rate:  [21 bmp] 21 bmp Vt Set:  [420 mL] 420 mL PEEP:  [5 cmH20] 5 cmH20 Plateau Pressure:  [20 cmH20-28 cmH20] 28 cmH20  INTAKE / OUTPUT: I/O last 3 completed shifts: In: 4329.2 [I.V.:169.2; Other:1075; GE/ZM:6294; IV Piggyback:150] Out: 7654 [Urine:3850; Stool:100]  PHYSICAL EXAMINATION: General:  Sedated. Comfortable.  HENT: NCAT ETT in place. Swollen tongue.  PULM:  Dec BS bases. Distant BS 2/2 obesity.  CV: RRR, no mgr GI: belly soft, nontender Derm: Gr 2 leg edema Neuro: sedated. Not following commands.    LABS: PULMONARY  Recent Labs Lab 06/01/16 0305 06/01/16 0900 06/02/16 0325 06/03/16 0523 06/04/16 0415 06/05/16 0350  PHART 7.464* 7.389 7.371 7.362 7.364 7.341*  PCO2ART 70.2* 81.8* 83.2* 80.3* 81.1* 82.8*  PO2ART 86.0 86.6 78.4* 70.3* 69.9* 63.2*  HCO3 50.3* 48.1* 47.5* 44.5* 45.2* 43.6*  TCO2 >50  --   --   --   --   --   O2SAT 96.0 94.4 94.8 92.3 92.3 88.5   CBC  Recent Labs Lab 06/04/16 0455 06/06/16 0500 06/07/16 0454  HGB 12.6 12.4 12.7  HCT 44.8 45.1 45.3  WBC 7.0 6.6 6.1  PLT 193 242 246   COAGULATION No results for input(s): INR in the last 168 hours. CARDIAC   No results for input(s): TROPONINI in the last 168 hours. No results for input(s): PROBNP in the last 168 hours.  CHEMISTRY  Recent Labs Lab 06/01/16 0554 06/02/16 0423 06/03/16 0425 06/04/16 0455 06/05/16 0842 06/06/16 0500 06/07/16 0454  NA 143 146* 148* 150* 151* 150* 153*  K 4.0 4.0 3.6 3.7 3.5 3.8 3.8  CL 88* 93* 97* 99* 101 101 104  CO2 44* 41* 44* 43* 42* 39* 41*  GLUCOSE 116* 86 93 116* 126* 114* 145*  BUN 15 27* 40* 48* 61* 68* 69*  CREATININE 0.98 1.08* 1.16* 1.25* 1.26* 1.14* 1.06*  CALCIUM 8.4* 8.5* 8.8* 9.4 9.6 9.7 9.9  MG  1.9 2.1 2.0 2.2 2.5* 2.6*  --   PHOS 4.5 3.1 3.2 3.1 4.0  --   --    Estimated Creatinine Clearance: 107.5 mL/min (by C-G formula based on SCr of 1.06 mg/dL (H)).  LIVER No results for input(s): AST, ALT, ALKPHOS, BILITOT, PROT, ALBUMIN, INR in the last 168 hours. INFECTIOUS No results for input(s): LATICACIDVEN, PROCALCITON in the last 168 hours. ENDOCRINE CBG (last 3)   Recent Labs  06/07/16 0005 06/07/16 0419 06/07/16 0802  GLUCAP 125* 153* 118*     DISCUSSION: [name] with PMH of super morbid obesity, HTN, pulmonary hypertension admitted with decompensated diastolic CHF.     PULMONARY A: Acute on Chronic Hypoxemic and Hypercarbic Respiratory Failure Secondary pulmonary hypertension Severe OSA / OHS with acute Decompensation -  sleep study in 2012 with up to 75 events per hour with desaturation to 72% Tongue swelling since 10/13 compromising airway.   P:   Wean with pressure support Wean sedation Continue diuresis today (lasix and diamox) VAP prevention O2 as needed to support sats > 88% Start steroids and benadryl and h2 blocker  for tongue swelling > not sure if allergic to a new medication.  Willl need to investigate what new medicine was started over the weekend.  May eventually need a trache > mother agrees. D12 on vent today.   CARDIOVASCULAR A:  Hypertensive Urgency > resolved Acute diastolic heart failure Demand ischemia Hx HTN Long pauses on telemetry in evenings P:  Hydralazine 75 mg TID  B-blocker per cardiology Amlodipine per cardiology Goal SBP < 150 per RN Tele  RENAL A:   AKI - improving Hypernatremia Hypokalemia P:   Monitor BMET and UOP Replace electrolytes as needed Replace K Continue diamox, furosemide per cardiology Continue lasix Continue free water for now  GASTROINTESTINAL A:   Morbid obesity Nutrition P:   TF per nutrition with free water (for hypernatremia) Outpatient obesity clinic referral  HEMATOLOGIC A:   VTE prophylaxis P:  SCD's / Lovenox for DVT prophylaxis CBC daily  INFECTIOUS A:   No indication of infection P:   Monitor for fever  ENDOCRINE A:   No acute issues P:   No interventions required  NEUROLOGIC A:   Acute encephalopathy - in the setting of hypercarbia Depression - family reports pt is in "denial" - seen by Psych 05/28/16  P:   Sedation> titrated to RASS -1 Fentanyl gtt/versed prn    FAMILY  - Updates: Family updated at length on 10/14 and 10/15. Mother updated at bedside on 10/16.   - Inter-disciplinary family meet or Palliative Care meeting due by: 10/12  Critical Care time with this patient today : 30 minutes.    Monica Becton, MD 06/07/2016, 9:25 AM [facility] Pager [phone]  [phone] After 3 pm or if no answer, call [phone]

## 2016-06-07 NOTE — Progress Notes (Signed)
PT Cancellation Note  Patient Details Name: [name] [name] MRN: [id number] DOB: 1981/05/02   Cancelled Treatment:    Reason Eval/Treat Not Completed: Medical issues which prohibited therapy   Noted overnight events and discussed with RN. She reported pt continues to have pauses with activity/movement (ex. When turning her for cleaning her).   Allene Furuya 06/07/2016, 4:38 PM Pager [phone]

## 2016-06-08 LAB — MAGNESIUM: Magnesium: 2.7 mg/dL — ABNORMAL HIGH (ref 1.7–2.4)

## 2016-06-08 LAB — PHOSPHORUS: Phosphorus: 3.3 mg/dL (ref 2.5–4.6)

## 2016-06-08 LAB — CBC
HCT: 45.9 % (ref 36.0–46.0)
Hemoglobin: 13.1 g/dL (ref 12.0–15.0)
MCH: 24.1 pg — ABNORMAL LOW (ref 26.0–34.0)
MCHC: 28.5 g/dL — ABNORMAL LOW (ref 30.0–36.0)
MCV: 84.5 fL (ref 78.0–100.0)
Platelets: 236 10*3/uL (ref 150–400)
RBC: 5.43 MIL/uL — ABNORMAL HIGH (ref 3.87–5.11)
RDW: 21.7 % — ABNORMAL HIGH (ref 11.5–15.5)
WBC: 6 10*3/uL (ref 4.0–10.5)

## 2016-06-08 LAB — BLOOD GAS, ARTERIAL
Acid-Base Excess: 14.9 mmol/L — ABNORMAL HIGH (ref 0.0–2.0)
Bicarbonate: 41.7 mmol/L — ABNORMAL HIGH (ref 20.0–28.0)
Drawn by: 441371
FIO2: 40
O2 Saturation: 93.7 %
PEEP: 5 cmH2O
Patient temperature: 98.5
Pressure support: 8 cmH2O
pCO2 arterial: 86.1 mmHg (ref 32.0–48.0)
pH, Arterial: 7.306 — ABNORMAL LOW (ref 7.350–7.450)
pO2, Arterial: 80.3 mmHg — ABNORMAL LOW (ref 83.0–108.0)

## 2016-06-08 LAB — BASIC METABOLIC PANEL
Anion gap: 10 (ref 5–15)
BUN: 65 mg/dL — ABNORMAL HIGH (ref 6–20)
CO2: 39 mmol/L — ABNORMAL HIGH (ref 22–32)
Calcium: 9.9 mg/dL (ref 8.9–10.3)
Chloride: 102 mmol/L (ref 101–111)
Creatinine, Ser: 1.09 mg/dL — ABNORMAL HIGH (ref 0.44–1.00)
GFR calc Af Amer: >60 mL/min (ref >60)
GFR calc non Af Amer: >60 mL/min (ref >60)
Glucose, Bld: 172 mg/dL — ABNORMAL HIGH (ref 65–99)
Potassium: 4.3 mmol/L (ref 3.5–5.1)
Sodium: 151 mmol/L — ABNORMAL HIGH (ref 135–145)

## 2016-06-08 LAB — GLUCOSE, CAPILLARY
Glucose-Capillary: 138 mg/dL — ABNORMAL HIGH (ref 65–99)
Glucose-Capillary: 154 mg/dL — ABNORMAL HIGH (ref 65–99)
Glucose-Capillary: 155 mg/dL — ABNORMAL HIGH (ref 65–99)
Glucose-Capillary: 158 mg/dL — ABNORMAL HIGH (ref 65–99)
Glucose-Capillary: 176 mg/dL — ABNORMAL HIGH (ref 65–99)
Glucose-Capillary: 184 mg/dL — ABNORMAL HIGH (ref 65–99)

## 2016-06-08 LAB — AMMONIA: Ammonia: 30 umol/L (ref 9–35)

## 2016-06-08 MED ORDER — INSULIN ASPART 100 UNIT/ML ~~LOC~~ SOLN
2.0000 [IU] | SUBCUTANEOUS | Status: DC
  Administered 2016-06-08 (×4): 4 [IU] via SUBCUTANEOUS
  Administered 2016-06-09: 2 [IU] via SUBCUTANEOUS
  Administered 2016-06-09: 6 [IU] via SUBCUTANEOUS
  Administered 2016-06-09: 2 [IU] via SUBCUTANEOUS
  Administered 2016-06-09 (×2): 4 [IU] via SUBCUTANEOUS
  Administered 2016-06-09 – 2016-06-10 (×7): 2 [IU] via SUBCUTANEOUS
  Administered 2016-06-11: 4 [IU] via SUBCUTANEOUS
  Administered 2016-06-11 – 2016-06-14 (×10): 2 [IU] via SUBCUTANEOUS
  Administered 2016-06-14: 4 [IU] via SUBCUTANEOUS
  Administered 2016-06-15 (×3): 2 [IU] via SUBCUTANEOUS

## 2016-06-08 NOTE — Consult Note (Signed)
Reason for Consult: Tracheostomy Referring Physician: [place] is an 35 y.o. female.  HPI: 36 year old female with history of hypertension and sleep apnea presented to the hospital 10/3 with a couple of weeks of worsening shortness of breath.  She was admitted and treated for heart failure.  On 10/6, she required intubation due to altered mental status and poor respiratory function.  She has bee actively diuresed since then but remains intubated.  Her tongue has been swelling for the past few days as well.  Tracheostomy was requested.  She had a sleep study in 2012 with AHI of 75 and minimum oxygen saturation of 72%.  Past Medical History:  Diagnosis Date  . Diabetes mellitus without complication (HCC)    GDM  . Gestational diabetes mellitus in pregnancy 05/24/2013  . HTN in pregnancy, chronic 05/24/2013  . Hypertension   . Morbid obesity ([place])   . Sinus tachycardia     History reviewed. No pertinent surgical history.  Family History  Problem Relation Age of Onset  . Hypertension Mother   . Diabetes Other     Social History:  reports that she has never smoked. She has never used smokeless tobacco. She reports that she does not drink alcohol or use drugs.  Allergies: No Known Allergies  Medications: I have reviewed the patient's current medications.  Results for orders placed or performed during the hospital encounter of 05/25/16 (from the past 48 hour(s))  Glucose, capillary     Status: Abnormal   Collection Time: 06/06/16  8:05 PM  Result Value Ref Range   Glucose-Capillary 114 (H) 65 - 99 mg/dL   Comment 1 Capillary Specimen   Glucose, capillary     Status: Abnormal   Collection Time: 06/07/16 12:05 AM  Result Value Ref Range   Glucose-Capillary 125 (H) 65 - 99 mg/dL  Glucose, capillary     Status: Abnormal   Collection Time: 06/07/16  4:19 AM  Result Value Ref Range   Glucose-Capillary 153 (H) 65 - 99 mg/dL   Comment 1 Capillary Specimen   Basic  metabolic panel     Status: Abnormal   Collection Time: 06/07/16  4:54 AM  Result Value Ref Range   Sodium 153 (H) 135 - 145 mmol/L   Potassium 3.8 3.5 - 5.1 mmol/L   Chloride 104 101 - 111 mmol/L   CO2 41 (H) 22 - 32 mmol/L   Glucose, Bld 145 (H) 65 - 99 mg/dL   BUN 69 (H) 6 - 20 mg/dL   Creatinine, Ser 1.06 (H) 0.44 - 1.00 mg/dL   Calcium 9.9 8.9 - 10.3 mg/dL   GFR calc non Af Amer >60 >60 mL/min   GFR calc Af Amer >60 >60 mL/min    Comment: (NOTE) The eGFR has been calculated using the CKD EPI equation. This calculation has not been validated in all clinical situations. eGFR's persistently <60 mL/min signify possible Chronic Kidney Disease.    Anion gap 8 5 - 15  CBC with Differential/Platelet     Status: Abnormal   Collection Time: 06/07/16  4:54 AM  Result Value Ref Range   WBC 6.1 4.0 - 10.5 K/uL    Comment: CONSISTENT WITH PREVIOUS RESULT   RBC 5.33 (H) 3.87 - 5.11 MIL/uL   Hemoglobin 12.7 12.0 - 15.0 g/dL    Comment: CONSISTENT WITH PREVIOUS RESULT   HCT 45.3 36.0 - 46.0 %   MCV 85.0 78.0 - 100.0 fL   MCH 23.8 (L) 26.0 -  34.0 pg   MCHC 28.0 (L) 30.0 - 36.0 g/dL   RDW 21.7 (H) 11.5 - 15.5 %   Platelets 246 150 - 400 K/uL    Comment: PLATELET COUNT CONFIRMED BY SMEAR   Neutrophils Relative % 58 %   Lymphocytes Relative 13 %   Monocytes Relative 18 %   Eosinophils Relative 10 %   Basophils Relative 1 %   Neutro Abs 3.5 1.7 - 7.7 K/uL   Lymphs Abs 0.8 0.7 - 4.0 K/uL   Monocytes Absolute 1.1 (H) 0.1 - 1.0 K/uL   Eosinophils Absolute 0.6 0.0 - 0.7 K/uL   Basophils Absolute 0.1 0.0 - 0.1 K/uL   RBC Morphology TARGET CELLS    Smear Review LARGE PLATELETS PRESENT   Glucose, capillary     Status: Abnormal   Collection Time: 06/07/16  8:02 AM  Result Value Ref Range   Glucose-Capillary 118 (H) 65 - 99 mg/dL  Glucose, capillary     Status: Abnormal   Collection Time: 06/07/16 11:03 AM  Result Value Ref Range   Glucose-Capillary 134 (H) 65 - 99 mg/dL   Comment 1  Capillary Specimen   Glucose, capillary     Status: Abnormal   Collection Time: 06/07/16  4:42 PM  Result Value Ref Range   Glucose-Capillary 159 (H) 65 - 99 mg/dL   Comment 1 Capillary Specimen   Glucose, capillary     Status: Abnormal   Collection Time: 06/07/16  7:32 PM  Result Value Ref Range   Glucose-Capillary 161 (H) 65 - 99 mg/dL   Comment 1 Capillary Specimen   Glucose, capillary     Status: Abnormal   Collection Time: 06/08/16 12:12 AM  Result Value Ref Range   Glucose-Capillary 138 (H) 65 - 99 mg/dL   Comment 1 Capillary Specimen   Glucose, capillary     Status: Abnormal   Collection Time: 06/08/16  3:35 AM  Result Value Ref Range   Glucose-Capillary 176 (H) 65 - 99 mg/dL   Comment 1 Capillary Specimen    Comment 2 Notify RN   CBC     Status: Abnormal   Collection Time: 06/08/16  3:42 AM  Result Value Ref Range   WBC 6.0 4.0 - 10.5 K/uL   RBC 5.43 (H) 3.87 - 5.11 MIL/uL   Hemoglobin 13.1 12.0 - 15.0 g/dL   HCT 45.9 36.0 - 46.0 %   MCV 84.5 78.0 - 100.0 fL   MCH 24.1 (L) 26.0 - 34.0 pg   MCHC 28.5 (L) 30.0 - 36.0 g/dL   RDW 21.7 (H) 11.5 - 15.5 %   Platelets 236 150 - 400 K/uL  Basic metabolic panel     Status: Abnormal   Collection Time: 06/08/16  3:42 AM  Result Value Ref Range   Sodium 151 (H) 135 - 145 mmol/L   Potassium 4.3 3.5 - 5.1 mmol/L   Chloride 102 101 - 111 mmol/L   CO2 39 (H) 22 - 32 mmol/L   Glucose, Bld 172 (H) 65 - 99 mg/dL   BUN 65 (H) 6 - 20 mg/dL   Creatinine, Ser 1.09 (H) 0.44 - 1.00 mg/dL   Calcium 9.9 8.9 - 10.3 mg/dL   GFR calc non Af Amer >60 >60 mL/min   GFR calc Af Amer >60 >60 mL/min    Comment: (NOTE) The eGFR has been calculated using the CKD EPI equation. This calculation has not been validated in all clinical situations. eGFR's persistently <60 mL/min signify possible Chronic Kidney Disease.  Anion gap 10 5 - 15  Magnesium     Status: Abnormal   Collection Time: 06/08/16  3:42 AM  Result Value Ref Range   Magnesium  2.7 (H) 1.7 - 2.4 mg/dL  Phosphorus     Status: None   Collection Time: 06/08/16  3:42 AM  Result Value Ref Range   Phosphorus 3.3 2.5 - 4.6 mg/dL  Glucose, capillary     Status: Abnormal   Collection Time: 06/08/16  7:57 AM  Result Value Ref Range   Glucose-Capillary 184 (H) 65 - 99 mg/dL   Comment 1 Capillary Specimen   Blood gas, arterial     Status: Abnormal   Collection Time: 06/08/16 10:30 AM  Result Value Ref Range   FIO2 40.00    Delivery systems VENTILATOR    Mode PRESSURE SUPPORT    Peep/cpap 5.0 cm H20   Pressure support 8.0 cm H20   pH, Arterial 7.306 (L) 7.350 - 7.450   pCO2 arterial 86.1 (HH) 32.0 - 48.0 mmHg    Comment: CRITICAL RESULT CALLED TO, READ BACK BY AND VERIFIED WITH: Chrissie Noa, RN AT 10:38 BY S. PALMER, RRT ON 06/08/16    pO2, Arterial 80.3 (L) 83.0 - 108.0 mmHg   Bicarbonate 41.7 (H) 20.0 - 28.0 mmol/L   Acid-Base Excess 14.9 (H) 0.0 - 2.0 mmol/L   O2 Saturation 93.7 %   Patient temperature 98.5    Collection site RIGHT RADIAL    Drawn by [phone]    Sample type ARTERIAL DRAW    Allens test (pass/fail) PASS PASS  Culture, respiratory (NON-Expectorated)     Status: None (Preliminary result)   Collection Time: 06/08/16 11:05 AM  Result Value Ref Range   Specimen Description TRACHEAL ASPIRATE    Special Requests Normal    Gram Stain      MODERATE WBC PRESENT,BOTH PMN AND MONONUCLEAR MODERATE GRAM VARIABLE ROD FEW GRAM POSITIVE COCCI IN PAIRS    Culture PENDING    Report Status PENDING   Glucose, capillary     Status: Abnormal   Collection Time: 06/08/16 12:05 PM  Result Value Ref Range   Glucose-Capillary 154 (H) 65 - 99 mg/dL   Comment 1 Venous Specimen   Ammonia     Status: None   Collection Time: 06/08/16 12:08 PM  Result Value Ref Range   Ammonia 30 9 - 35 umol/L  Glucose, capillary     Status: Abnormal   Collection Time: 06/08/16  3:48 PM  Result Value Ref Range   Glucose-Capillary 155 (H) 65 - 99 mg/dL   Comment 1 Capillary  Specimen     Dg Chest Port 1 View  Result Date: 06/07/2016 CLINICAL DATA:  Hypoxia EXAM: PORTABLE CHEST 1 VIEW COMPARISON:  June 06, 2016 FINDINGS: Endotracheal tube tip is 3.7 cm above the carina. Nasogastric tube tip and side port are below the diaphragm. Central catheter tip is in the superior vena cava. No pneumothorax. There is a small pleural effusion on the right. There is mild interstitial edema. Patchy airspace consolidation in the medial base regions is stable. There is cardiomegaly with pulmonary venous hypertension. There is atherosclerotic calcification in the aorta. No evident adenopathy. IMPRESSION: Tube and catheter positions as described without pneumothorax. Changes of congestive heart failure remain. Airspace consolidation in the medial lung bases may represent alveolar edema or could represent superimposed pneumonia. Both entities may exist concurrently. There is aortic atherosclerosis. Electronically Signed   By: Lowella Grip III M.D.   On: 06/07/2016 07:16  Review of Systems  Unable to perform ROS: Intubated   Blood pressure (!) 141/85, pulse 94, temperature 99.6 F (37.6 C), temperature source Oral, resp. rate (!) 25, height _0  (1.6 m), weight (!) 150.8 kg (332 lb 8 oz), SpO2 95 %, unknown if currently breastfeeding. Physical Exam  Constitutional: She appears well-developed and well-nourished. No distress.  Orotracheally intubated.  Opens eyes to voice.  HENT:  Head: Normocephalic and atraumatic.  Right Ear: External ear normal.  Left Ear: External ear normal.  Nose: Nose normal.  Tongue enlarged.  Eyes: EOM are normal. Pupils are equal, round, and reactive to light.  Neck: Normal range of motion. Neck supple.  No mass or scar.  Cardiovascular: Normal rate.   Respiratory: Effort normal.  Mechanical ventilator  Neurological:  Intubated and sedated.  Skin: Skin is warm and dry.  Psychiatric:  Unable to assess.    Assessment/Plan: Respiratory  failure and obstructive sleep apnea I discussed her situation with her mother and recommended proceeding with tracheostomy.  Risks, benefits, and alternatives were discussed and her mother expressed understanding and agreement.  The tracheostomy may be beneficial chronically to manage her sleep apnea.  This will be considered over time.  Surgery is scheduled for Friday.  Christie Copley 06/08/2016, 4:17 PM

## 2016-06-08 NOTE — Progress Notes (Signed)
Physical Therapy Treatment Patient Details Name: [name] [name] MRN: [id number] DOB: [birth year] Today's Date: 06/08/2016    History of Present Illness [name] with PMH of morbid obesity (469 lbs), DM, HTN and tachycardia who presented to [facility] on 10/3 with reports of abdominal bloating and heavy abdominal pressure.  Intubated 05/29/16 to present.     PT Comments    Pt admitted with above diagnosis. Pt currently with functional limitations due to the deficits listed below (see PT Problem List). Pt tolerated Vital Go tilt lift bed well with tolerance of 55 degrees x 15 min.  Will continue to progress as pt is able.  Pt will benefit from skilled PT to increase their independence and safety with mobility to allow discharge to the venue listed below.    Follow Up Recommendations  SNF;Supervision/Assistance - 24 hour     Equipment Recommendations  Other (comment) (TBA)    Recommendations for Other Services       Precautions / Restrictions Precautions Precautions: Fall    Mobility  Bed Mobility               General bed mobility comments: Moved pt up in bed with total assist.  Used VitalGo total lift bed to progressively incline pt to 55 degrees with 91% weight bearing on pts LEs.  Pt appeared to move LEs 2-3 times but not to command.  Once pt at 55 degrees, pt did shrug shoulders and move UES x 2-3 x but again not to command.  Pt would open eyes but would not track with stare to pts left side.    Transfers                    Ambulation/Gait                 Stairs            Wheelchair Mobility    Modified Rankin (Stroke Patients Only)       Balance Overall balance assessment: Needs assistance         Standing balance support: No upper extremity supported;During functional activity Standing balance-Leahy Scale: Zero Standing balance comment: Vital GoTotal lift bed achieved 55 degree standing with 90% weight bearing with total assist of 2  persons.  Nursing educated some regarding tilt features.                      Cognition Arousal/Alertness: Lethargic;Suspect due to medications Behavior During Therapy: Flat affect (pt would open eyes but not responding) Overall Cognitive Status: Impaired/Different from baseline Area of Impairment: Orientation;Following commands;Safety/judgement;Awareness;Problem solving Orientation Level: Disoriented to;Situation;Time;Place   Memory: Decreased recall of precautions;Decreased short-term memory   Safety/Judgement: Decreased awareness of safety   Problem Solving: Slow processing;Decreased initiation;Difficulty sequencing;Requires verbal cues;Requires tactile cues General Comments: Pt much less alert today. For the most part unarousable.      Exercises General Exercises - Upper Extremity Shoulder Flexion: Both;PROM;5 reps;Supine Elbow Flexion: PROM;Both;5 reps;Supine    General Comments General comments (skin integrity, edema, etc.): Pt had once episode of bradycardia to 79 bpm when laid pt flat to move up in bed but came back to 104 bpm quickly.  HR 104 - 120 bpm; BP 150/99 at rest initially.  144/89 when at 30 degrees and 138/78 when at 55 degrees.  Pt on PEEP 5, 40% FIO2 on vent.  RR 13-17.        Pertinent Vitals/Pain Pain Assessment: No/denies pain  See above VS  Home Living                      Prior Function            PT Goals (current goals can now be found in the care plan section) Acute Rehab PT Goals Patient Stated Goal: unable to state Progress towards PT goals: Progressing toward goals    Frequency    Min 3X/week      PT Plan Current plan remains appropriate    Co-evaluation             End of Session Equipment Utilized During Treatment: Gait belt;Oxygen (on vent) Activity Tolerance: Patient limited by fatigue Patient left: with call bell/phone within reach;with family/visitor present;in bed;with bed alarm set     Time:  6286-3817 PT Time Calculation (min) (ACUTE ONLY): 32 min  Charges:  $Therapeutic Exercise: 8-22 mins $Therapeutic Activity: 8-22 mins                    G CodesIrwin Brakeman F 06-14-2016, 11:50 AM  Prayan Ulin,PT Acute Rehabilitation [phone] (pager)

## 2016-06-08 NOTE — Progress Notes (Signed)
Patient Name: [name] [name] Date of Encounter: 06/08/2016  Primary Cardiologist: [facility] Problem List     Principal Problem:   Acute congestive heart failure [facility]) Active Problems:   Malignant hypertension   Morbid obesity (HCC)   OSA (obstructive sleep apnea)   Renal insufficiency   Acute pulmonary edema (HCC)   Abdominal pain   Hepatic congestion   Sinus tachycardia   Pericardial effusion   Pulmonary hypertension   Acute respiratory failure with hypoxemia (HCC)   Difficult intravenous access   Sinus pause     Subjective   Remains intubated. More alert. Tmax 100.6  Inpatient Medications    Scheduled Meds: . acetaZOLAMIDE  250 mg Oral Daily  . amLODipine  10 mg Per Tube Daily  . chlorhexidine gluconate (MEDLINE KIT)  15 mL Mouth Rinse BID  . dexamethasone  4 mg Intravenous Q6H  . diphenhydrAMINE (BENADRYL) IVPB(SICKLE CELL ONLY)  25 mg Intravenous Q6H  . docusate  100 mg Per Tube BID  . enoxaparin (LOVENOX) injection  70 mg Subcutaneous Q24H  . famotidine (PEPCID) IV  20 mg Intravenous Q12H  . feeding supplement (PRO-STAT SUGAR FREE 64)  60 mL Per Tube TID  . feeding supplement (VITAL HIGH PROTEIN)  1,000 mL Per Tube Q24H  . free water  250 mL Per Tube Q4H  . furosemide  60 mg Intravenous Q8H  . hydrALAZINE  75 mg Oral Q8H  . insulin aspart  2-6 Units Subcutaneous Q4H  . isosorbide dinitrate  30 mg Oral BID  . magnesium hydroxide  30 mL Per Tube Daily  . mouth rinse  15 mL Mouth Rinse 10 times per day  . polyethylene glycol  17 g Oral Daily  . potassium chloride  40 mEq Per Tube BID  . sodium chloride flush  10-40 mL Intracatheter Q12H  . sodium chloride flush  3 mL Intravenous Q12H  . THROMBI-PAD  1 each Topical Once   Continuous Infusions: . fentaNYL infusion INTRAVENOUS 50 mcg/hr (06/08/16 0800)   PRN Meds:.acetaminophen (TYLENOL) oral liquid 160 mg/5 mL, fentaNYL (SUBLIMAZE) injection, hydrALAZINE, midazolam, ondansetron **OR** ondansetron  (ZOFRAN) IV, sodium chloride flush   Vital Signs    Vitals:   06/08/16 0650 06/08/16 0700 06/08/16 0736 06/08/16 0800  BP: (!) 161/95 (!) 157/89 (!) 160/89 (!) 167/93  Pulse:  93 97 (!) 117  Resp:  (!) 21 (!) 24 16  Temp:    98.5 F (36.9 C)  TempSrc:    Oral  SpO2:  95% 94% 97%  Weight:      Height:        Intake/Output Summary (Last 24 hours) at 06/08/16 1016 Last data filed at 06/08/16 0800  Gross per 24 hour  Intake          2900.21 ml  Output             4875 ml  Net         -1974.79 ml   Filed Weights   06/06/16 0500 06/07/16 0442 06/08/16 0455  Weight: (!) 335 lb (152 kg) (!) 338 lb (153.3 kg) (!) 332 lb 8 oz (150.8 kg)    Physical Exam    GEN: Sedate on vent HEENT: Grossly normal. ETT. Neck: Supple, no JVD, carotid bruits, or masses. Cardiac: RRR, no murmurs, rubs, or gallops. No clubbing, cyanosis, edema.  Radials/DP/PT 2+ and equal bilaterally.  Respiratory:  Respirations regular increased effort noted, clear to auscultation bilaterally, decreased BS. GI: Soft, nontender, nondistended, BS +  x 4. obese MS: no deformity or atrophy. Skin: warm and dry, no rash. Neuro:  Strength and sensation are intact. Psyc: sedate  Labs    CBC  Recent Labs  06/06/16 0500 06/07/16 0454 06/08/16 0342  WBC 6.6 6.1 6.0  NEUTROABS 4.0 3.5  --   HGB 12.4 12.7 13.1  HCT 45.1 45.3 45.9  MCV 86.6 85.0 84.5  PLT 242 246 086   Basic Metabolic Panel  Recent Labs  06/06/16 0500 06/07/16 0454 06/08/16 0342  NA 150* 153* 151*  K 3.8 3.8 4.3  CL 101 104 102  CO2 39* 41* 39*  GLUCOSE 114* 145* 172*  BUN 68* 69* 65*  CREATININE 1.14* 1.06* 1.09*  CALCIUM 9.7 9.9 9.9  MG 2.6*  --  2.7*  PHOS  --   --  3.3    Telemetry    Has had up to 11 second pauses with cough, strain (vagal/OSA, reviewed with Dr. Rayann Heman) - Personally Reviewed  ECG    NSR, PRWP- Personally Reviewed  Radiology    Dg Chest Port 1 View  Result Date: 06/07/2016 CLINICAL DATA:  Hypoxia  EXAM: PORTABLE CHEST 1 VIEW COMPARISON:  June 06, 2016 FINDINGS: Endotracheal tube tip is 3.7 cm above the carina. Nasogastric tube tip and side port are below the diaphragm. Central catheter tip is in the superior vena cava. No pneumothorax. There is a small pleural effusion on the right. There is mild interstitial edema. Patchy airspace consolidation in the medial base regions is stable. There is cardiomegaly with pulmonary venous hypertension. There is atherosclerotic calcification in the aorta. No evident adenopathy. IMPRESSION: Tube and catheter positions as described without pneumothorax. Changes of congestive heart failure remain. Airspace consolidation in the medial lung bases may represent alveolar edema or could represent superimposed pneumonia. Both entities may exist concurrently. There is aortic atherosclerosis. Electronically Signed   By: Lowella Grip III M.D.   On: 06/07/2016 07:16     Cardiac Studies   CT scan chest: No PE, but dilated pulmonary artery (89m).  ECHO: - Left ventricle: The cavity size was normal. Systolic function was   normal. The estimated ejection fraction was in the range of 50%   to 55%. Regional wall motion abnormalities cannot be excluded.   The study is not technically sufficient to allow evaluation of LV   diastolic function. - Left atrium: The atrium was mildly dilated. - Right atrium: The atrium was mildly dilated. - Pulmonary arteries: Systolic pressure was mildly increased. PA   peak pressure: 42 mm Hg (S).  Impressions:  - Extremely limited; definity used; probable low normal LV sstolci   function; mild biatrial enlargement; mild TR; mildly elevated   pulmonary pressure; suggest MUGA or cardiac MRI if clinically   indicated.  Patient Profile     35year old with super morbid obesity, secondary pulmonary hypertension, acute diastolic heart failure, OSA/Obesity hypoventilation, hypercarbic respiratory failure.  Assessment & Plan      Acute diastolic HF  -ECHO with normal EF but mild pulm HTN.   - Would continue with lasix and watch for change in creat.   - Difficult to assess fluid status. Has diuresed -429Liters.   Obesity  - Secondary pulm htn  - weight loss (bed weights not precise)  - CPAP/BIPAP eventually but now ETT  Secondary pulm htn  - as above  - weight loss  - BP control (should improve with continued diuresis)  - small pericardial effusion should be of no clinical  concern.   Elevated troponin/ demand ischemia  - low level troponin secondary to acute heart strain/failure.   Essential hypertension  - BP improved, see meds.   Sinus Pause   - 11 second at one point - Vagal/OSA. No pacer indicated, discussed with Dr. Rayann Heman, EP.   - stopped metoprolol  - we are seeing some sinus tachycardia. Will tolerate.   Signed, Candee Furbish, MD  06/08/2016, 10:16 AM

## 2016-06-08 NOTE — Progress Notes (Signed)
PULMONARY / CRITICAL CARE MEDICINE   Name: [name] [name] MRN: [id number] DOB: [birth year]    ADMISSION DATE:  05/25/2016 CONSULTATION DATE:  05/27/16  REFERRING MD:  Dr. Ree Kida - [facility]  CHIEF COMPLAINT:  Altered Mental Status   BRIEF: [name] with PMH of morbid obesity (469 lbs), DM, HTN and tachycardia who presented to [facility] on 10/3 with acute on chronic respiratory failure with hypercarbia and hypoxemia due to diastolic CHF exacerbation and obesity hypoventilation syndrome.  STUDIES:  ECHO 10/4 >> EF 55%, PAP 42  CULTURES:   ANTIBIOTICS:   SIGNIFICANT EVENTS: 10/3  Admit 10/5  Transferred to ICU due to AMS, hypercarbic respiratory failure 05/28/16:  RN reports pt remains on bipap, ntg gtt continues & BP remains elevated in 982'M systolic.  Net negative 5.5 L in last 24 hours with improvement in sr cr  05/29/16 - intubated overnight .  10/7 > 10/14 diuresed aggressively 10/14 - attempting wean 10/15 weaning  SUBJECTIVE Pt awake but not following commands > not sure if this started over the weekend or Monday. Not on long acting sedating meds.  On PST > tachypneic with alar flaring.   She goes into coughing fits and goes bradycardia/vagals down.  Pt's tongue is swollen > noted since Friday > some better.   VITAL SIGNS: BP (!) 167/93 (BP Location: Left Arm)   Pulse (!) 117   Temp 98.5 F (36.9 C) (Oral)   Resp 16   Ht _0  (1.6 m)   Wt (!) 150.8 kg (332 lb 8 oz)   LMP  (LMP Unknown)   SpO2 97%   BMI 58.90 kg/m   HEMODYNAMICS: CVP:  [7 mmHg-21 mmHg] 14 mmHg  VENTILATOR SETTINGS: Vent Mode: CPAP;PSV FiO2 (%):  [40 %] 40 % Set Rate:  [21 bmp] 21 bmp Vt Set:  [420 mL] 420 mL PEEP:  [5 cmH20] 5 cmH20 Pressure Support:  [5 cmH20-8 cmH20] 8 cmH20 Plateau Pressure:  [16 cmH20-19 cmH20] 17 cmH20  INTAKE / OUTPUT: I/O last 3 completed shifts: In: 4523.5 [I.V.:163.5; Other:850; NG/GT:3210; IV Piggyback:300] Out: 4158 [Urine:5915]  PHYSICAL EXAMINATION: General:  awake. Opens eyes and has visual threat. Pupils 3 mm reactive. (+) erythema in R eye x 3 days now at least. Does not follow other commands.  HENT: NCAT ETT in place. Swollen tongue.  PULM:  Dec BS bases. Distant BS 2/2 obesity.  CV: RRR, no mgr GI: belly soft, nontender Derm: Gr 2 leg edema Neuro: sedated. Not following commands.   LABS: PULMONARY  Recent Labs Lab 06/02/16 0325 06/03/16 0523 06/04/16 0415 06/05/16 0350  PHART 7.371 7.362 7.364 7.341*  PCO2ART 83.2* 80.3* 81.1* 82.8*  PO2ART 78.4* 70.3* 69.9* 63.2*  HCO3 47.5* 44.5* 45.2* 43.6*  O2SAT 94.8 92.3 92.3 88.5   CBC  Recent Labs Lab 06/06/16 0500 06/07/16 0454 06/08/16 0342  HGB 12.4 12.7 13.1  HCT 45.1 45.3 45.9  WBC 6.6 6.1 6.0  PLT 242 246 236   COAGULATION No results for input(s): INR in the last 168 hours. CARDIAC   No results for input(s): TROPONINI in the last 168 hours. No results for input(s): PROBNP in the last 168 hours.  CHEMISTRY  Recent Labs Lab 06/02/16 0423 06/03/16 0425 06/04/16 0455 06/05/16 0842 06/06/16 0500 06/07/16 0454 06/08/16 0342  NA 146* 148* 150* 151* 150* 153* 151*  K 4.0 3.6 3.7 3.5 3.8 3.8 4.3  CL 93* 97* 99* 101 101 104 102  CO2 41* 44* 43* 42* 39* 41* 39*  GLUCOSE 86 93 116* 126* 114* 145* 172*  BUN 27* 40* 48* 61* 68* 69* 65*  CREATININE 1.08* 1.16* 1.25* 1.26* 1.14* 1.06* 1.09*  CALCIUM 8.5* 8.8* 9.4 9.6 9.7 9.9 9.9  MG 2.1 2.0 2.2 2.5* 2.6*  --  2.7*  PHOS 3.1 3.2 3.1 4.0  --   --  3.3   Estimated Creatinine Clearance: 103.4 mL/min (by C-G formula based on SCr of 1.09 mg/dL (H)).  LIVER No results for input(s): AST, ALT, ALKPHOS, BILITOT, PROT, ALBUMIN, INR in the last 168 hours. INFECTIOUS No results for input(s): LATICACIDVEN, PROCALCITON in the last 168 hours. ENDOCRINE CBG (last 3)   Recent Labs  06/08/16 0012 06/08/16 0335 06/08/16 0757  GLUCAP 138* 176* 184*     DISCUSSION: [name] with PMH of super morbid obesity, HTN, pulmonary  hypertension admitted with decompensated diastolic CHF.     PULMONARY A: Acute on Chronic Hypoxemic and Hypercarbic Respiratory Failure Concern for HCAP as pt has been on the vent x 13 days and she is failing PST despite diuresis. CXR is always suboptimal 2/2 body habitus.  Secondary pulmonary hypertension Severe OSA / OHS with acute Decompensation - sleep study in 2012 with up to 75 events per hour with desaturation to 72% Tongue swelling since 10/13 compromising airway.   P:   Pt is on the vent x 13 days and she is failing PST now. She has diuresced well. Is tachypneic and has alar flaring,  Concern for HCAP although (-) fever and elevated WBC. Tongue is also swollen further compromising her airway.  I think she will need a tracheostomy.  She has a difficult airway.  We will consult ENT. I discussed with mother re: trache on 10/17 > she agreed. I spoke with ENT Dr. Redmond Baseman and he will try to do trache this week.  Try to get sputum sample. Cont PST (no extubation) Continue diuresis  (lasix and diamox) VAP prevention O2 as needed to support sats > 88% Cont  steroids and benadryl and h2 blocker  for tongue swelling > not sure if allergic to a new medication. Allegedly no new recent medication.    CARDIOVASCULAR A:  Hypertensive Urgency > resolved Acute diastolic heart failure Demand ischemia Hx HTN Long pauses on telemetry in evenings P:  Hydralazine 75 mg TID  B-blocker per cardiology Amlodipine per cardiology Goal SBP < 150 per RN Tele  RENAL A:   AKI - improving Hypernatremia Hypokalemia P:   Monitor BMET and UOP Replace electrolytes as needed Continue diamox, furosemide per cardiology Continue free water for now  GASTROINTESTINAL A:   Morbid obesity Nutrition P:   TF per nutrition with free water (for hypernatremia) Outpatient obesity clinic referral  HEMATOLOGIC A:   VTE prophylaxis P:  SCD's / Lovenox for DVT prophylaxis CBC daily  INFECTIOUS A:    No indication of infection P:   Monitor for fever. I am beginning to think pt has HCAP (possible PSA) as she is failing PST despite aggressive diuresis. Check sputum sample.   ENDOCRINE A:   No acute issues P:   No interventions required  NEUROLOGIC A:   Acute encephalopathy - in the setting of hypercarbia. Pt noted to be awake and not following commands the last 2 days at least. Not sure if its worse hypercapnea. Will check ABG now. Check ammonia. May need cranial ct scan or MRI.  Depression - family reports pt is in "denial" - seen by Psych 05/28/16  P:   Sedation>  titrated to RASS -1 Fentanyl gtt/versed prn    FAMILY  - Updates: Family updated at length on 10/14 and 10/15. Mother updated at bedside on 10/17.   - Inter-disciplinary family meet or Palliative Care meeting due by: 10/12  Critical Care time with this patient today : 30 minutes.    Monica Becton, MD 06/08/2016, 9:23 AM [facility] Pager [phone] After 3 pm or if no answer, call [phone]

## 2016-06-08 NOTE — Progress Notes (Addendum)
RT placed pt back on full vent support and increased the RR to 25 per Dr. [name] based on ABG results.

## 2016-06-09 DIAGNOSIS — I455 Other specified heart block: Secondary | ICD-10-CM

## 2016-06-09 DIAGNOSIS — I272 Pulmonary hypertension, unspecified: Secondary | ICD-10-CM

## 2016-06-09 DIAGNOSIS — R000 Tachycardia, unspecified: Secondary | ICD-10-CM

## 2016-06-09 LAB — BASIC METABOLIC PANEL
Anion gap: 11 (ref 5–15)
BUN: 83 mg/dL — ABNORMAL HIGH (ref 6–20)
CO2: 38 mmol/L — ABNORMAL HIGH (ref 22–32)
Calcium: 10.2 mg/dL (ref 8.9–10.3)
Chloride: 100 mmol/L — ABNORMAL LOW (ref 101–111)
Creatinine, Ser: 1.3 mg/dL — ABNORMAL HIGH (ref 0.44–1.00)
GFR calc Af Amer: >60 mL/min (ref >60)
GFR calc non Af Amer: 52 mL/min — ABNORMAL LOW (ref >60)
Glucose, Bld: 190 mg/dL — ABNORMAL HIGH (ref 65–99)
Potassium: 4.4 mmol/L (ref 3.5–5.1)
Sodium: 149 mmol/L — ABNORMAL HIGH (ref 135–145)

## 2016-06-09 LAB — GLUCOSE, CAPILLARY
Glucose-Capillary: 126 mg/dL — ABNORMAL HIGH (ref 65–99)
Glucose-Capillary: 126 mg/dL — ABNORMAL HIGH (ref 65–99)
Glucose-Capillary: 138 mg/dL — ABNORMAL HIGH (ref 65–99)
Glucose-Capillary: 147 mg/dL — ABNORMAL HIGH (ref 65–99)
Glucose-Capillary: 182 mg/dL — ABNORMAL HIGH (ref 65–99)
Glucose-Capillary: 193 mg/dL — ABNORMAL HIGH (ref 65–99)
Glucose-Capillary: 207 mg/dL — ABNORMAL HIGH (ref 65–99)

## 2016-06-09 LAB — CBC
HCT: 45.4 % (ref 36.0–46.0)
Hemoglobin: 13.2 g/dL (ref 12.0–15.0)
MCH: 24.2 pg — ABNORMAL LOW (ref 26.0–34.0)
MCHC: 29.1 g/dL — ABNORMAL LOW (ref 30.0–36.0)
MCV: 83.3 fL (ref 78.0–100.0)
Platelets: 220 10*3/uL (ref 150–400)
RBC: 5.45 MIL/uL — ABNORMAL HIGH (ref 3.87–5.11)
RDW: 22 % — ABNORMAL HIGH (ref 11.5–15.5)
WBC: 5.5 10*3/uL (ref 4.0–10.5)

## 2016-06-09 LAB — PROCALCITONIN: Procalcitonin: 0.1 ng/mL

## 2016-06-09 LAB — PHOSPHORUS: Phosphorus: 5.2 mg/dL — ABNORMAL HIGH (ref 2.5–4.6)

## 2016-06-09 LAB — MAGNESIUM: Magnesium: 2.8 mg/dL — ABNORMAL HIGH (ref 1.7–2.4)

## 2016-06-09 MED ORDER — PIPERACILLIN-TAZOBACTAM 3.375 G IVPB
3.3750 g | Freq: Three times a day (TID) | INTRAVENOUS | Status: DC
  Administered 2016-06-09 – 2016-06-15 (×18): 3.375 g via INTRAVENOUS
  Filled 2016-06-09 (×22): qty 50

## 2016-06-09 MED ORDER — VANCOMYCIN HCL 10 G IV SOLR
1250.0000 mg | Freq: Two times a day (BID) | INTRAVENOUS | Status: DC
  Administered 2016-06-09 – 2016-06-12 (×7): 1250 mg via INTRAVENOUS
  Filled 2016-06-09 (×10): qty 1250

## 2016-06-09 MED ORDER — VANCOMYCIN HCL 10 G IV SOLR
2500.0000 mg | Freq: Once | INTRAVENOUS | Status: AC
  Administered 2016-06-09: 2500 mg via INTRAVENOUS
  Filled 2016-06-09: qty 2500

## 2016-06-09 NOTE — Progress Notes (Signed)
Patient Name: [name] [name] Date of Encounter: 06/09/2016  Primary Cardiologist: Reola Calkins [facility] Problem List     Principal Problem:   Acute congestive heart failure [facility]) Active Problems:   Malignant hypertension   Morbid obesity (HCC)   OSA (obstructive sleep apnea)   Renal insufficiency   Acute pulmonary edema (HCC)   Abdominal pain   Hepatic congestion   Sinus tachycardia   Pericardial effusion   Pulmonary hypertension   Acute respiratory failure with hypoxemia (HCC)   Difficult intravenous access   Sinus pause     Subjective   Remains intubated.   Inpatient Medications    Scheduled Meds: . acetaZOLAMIDE  250 mg Oral Daily  . amLODipine  10 mg Per Tube Daily  . chlorhexidine gluconate (MEDLINE KIT)  15 mL Mouth Rinse BID  . docusate  100 mg Per Tube BID  . enoxaparin (LOVENOX) injection  70 mg Subcutaneous Q24H  . famotidine (PEPCID) IV  20 mg Intravenous Q12H  . feeding supplement (PRO-STAT SUGAR FREE 64)  60 mL Per Tube TID  . feeding supplement (VITAL HIGH PROTEIN)  1,000 mL Per Tube Q24H  . free water  250 mL Per Tube Q4H  . furosemide  60 mg Intravenous Q8H  . hydrALAZINE  75 mg Oral Q8H  . insulin aspart  2-6 Units Subcutaneous Q4H  . isosorbide dinitrate  30 mg Oral BID  . magnesium hydroxide  30 mL Per Tube Daily  . mouth rinse  15 mL Mouth Rinse 10 times per day  . polyethylene glycol  17 g Oral Daily  . potassium chloride  40 mEq Per Tube BID  . sodium chloride flush  10-40 mL Intracatheter Q12H  . sodium chloride flush  3 mL Intravenous Q12H  . THROMBI-PAD  1 each Topical Once   Continuous Infusions: . fentaNYL infusion INTRAVENOUS 50 mcg/hr (06/09/16 0800)   PRN Meds:.acetaminophen (TYLENOL) oral liquid 160 mg/5 mL, fentaNYL (SUBLIMAZE) injection, hydrALAZINE, midazolam, ondansetron **OR** ondansetron (ZOFRAN) IV, sodium chloride flush   Vital Signs    Vitals:   06/09/16 0500 06/09/16 0600 06/09/16 0735 06/09/16 0800  BP:  (!)  146/91 (!) 154/102 (!) 150/100  Pulse: 90 89 87 99  Resp: (!) 25 (!) 21 (!) 25 (!) 36  Temp:    99.1 F (37.3 C)  TempSrc:    Axillary  SpO2: 95% 95% 96% 96%  Weight:      Height:        Intake/Output Summary (Last 24 hours) at 06/09/16 0850 Last data filed at 06/09/16 0800  Gross per 24 hour  Intake           2847.5 ml  Output             4525 ml  Net          -1677.5 ml   Filed Weights   06/07/16 0442 06/08/16 0455 06/09/16 0400  Weight: (!) 338 lb (153.3 kg) (!) 332 lb 8 oz (150.8 kg) (!) 314 lb 8 oz (142.7 kg)    Physical Exam    GEN: Sedate on vent HEENT: Grossly normal. ETT. Neck: Supple, no JVD, carotid bruits, or masses. Cardiac: RRR, no murmurs, rubs, or gallops. No clubbing, cyanosis, edema.  Radials/DP/PT 2+ and equal bilaterally.  Respiratory: vent noise B GI: Soft, nontender, nondistended, BS + x 4. obese MS: no deformity or atrophy. Skin: warm and dry, no rash. Neuro:  sedate. Psyc: sedate  Labs    CBC  Recent  Labs  06/07/16 0454 06/08/16 0342 06/09/16 0458  WBC 6.1 6.0 5.5  NEUTROABS 3.5  --   --   HGB 12.7 13.1 13.2  HCT 45.3 45.9 45.4  MCV 85.0 84.5 83.3  PLT 246 236 148   Basic Metabolic Panel  Recent Labs  06/08/16 0342 06/09/16 0458  NA 151* 149*  K 4.3 4.4  CL 102 100*  CO2 39* 38*  GLUCOSE 172* 190*  BUN 65* 83*  CREATININE 1.09* 1.30*  CALCIUM 9.9 10.2  MG 2.7* 2.8*  PHOS 3.3 5.2*    Telemetry    Has had up to 11 second pauses with cough, strain (vagal/OSA, reviewed with Dr. Rayann Heman), now NSR, ST - Personally Reviewed  ECG    NSR, PRWP- Personally Reviewed  Radiology    No results found.   Cardiac Studies   CT scan chest: No PE, but dilated pulmonary artery (83m).  ECHO: - Left ventricle: The cavity size was normal. Systolic function was   normal. The estimated ejection fraction was in the range of 50%   to 55%. Regional wall motion abnormalities cannot be excluded.   The study is not technically  sufficient to allow evaluation of LV   diastolic function. - Left atrium: The atrium was mildly dilated. - Right atrium: The atrium was mildly dilated. - Pulmonary arteries: Systolic pressure was mildly increased. PA   peak pressure: 42 mm Hg (S).  Impressions:  - Extremely limited; definity used; probable low normal LV sstolci   function; mild biatrial enlargement; mild TR; mildly elevated   pulmonary pressure; suggest MUGA or cardiac MRI if clinically   indicated.  Patient Profile     35year old with super morbid obesity, secondary pulmonary hypertension, acute diastolic heart failure, OSA/Obesity hypoventilation, hypercarbic respiratory failure.  Assessment & Plan    Acute diastolic HF  -ECHO with normal EF but mild pulm HTN.   - Stop lasix BUN and creat now elevating.   - CVP 4. Has diuresed -426Liters.   - Discussed with CCM team  Obesity  - Secondary pulm htn (dilated pulm art)  - weight loss  - Trach discussed  Secondary pulm htn  - as above  - weight loss  - BP control  - small pericardial effusion should be of no clinical concern.   Elevated troponin/ demand ischemia  - low level troponin secondary to acute heart strain/failure.   Essential hypertension  - BP elevated again despite aggressive diuresis, see meds.   Sinus Pause   - 11 second at one point - Vagal/OSA. No pacer indicated, discussed with Dr. ARayann Heman EP.   - stopped metoprolol 10/14  - we are seeing some sinus tachycardia. Will tolerate.   AKI  - stop lasix and diamox for now  Signed, MCandee Furbish MD  06/09/2016, 8:50 AM

## 2016-06-09 NOTE — Progress Notes (Signed)
Physical Therapy Treatment Patient Details Name: [name] [name] MRN: [id number] DOB: [birth year] Today's Date: 06/09/2016    History of Present Illness 34 y/o F with PMH of morbid obesity (469 lbs), DM, HTN and tachycardia who presented to [facility] on 10/3 with reports of abdominal bloating and heavy abdominal pressure.  Intubated 05/29/16 to present.     PT Comments    Pt admitted with above diagnosis. Pt currently with functional limitations due to balance and endurance deficits as well as strength issues.  Pt was alert most of session today and answering yes no questions with good reliability.  Pt was able to tolerate the tilt bed 51 degrees x 16 minutes.  Pt responsive to her mom when she came in room as well.  Pt likes gospel music and left her TV on the waves as pt shook head that she loved to hear the ocean.  Pt progressing.  Pt will benefit from skilled PT to increase their independence and safety with mobility to allow discharge to the venue listed below.    Follow Up Recommendations  SNF;Supervision/Assistance - 24 hour     Equipment Recommendations  Other (comment) (TBA)    Recommendations for Other Services       Precautions / Restrictions Precautions Precautions: Fall    Mobility  Bed Mobility Overal bed mobility: Needs Assistance             General bed mobility comments: Moved pt up in bed with total assist.  Used VitalGo total lift bed to progressively incline pt to 51 degrees with 91% weight bearing on pts LEs.  Once pt at 51 degrees, pt did adjusted shoulders and move UES x 2-3 x but again not to command.  Pt tracking with eyes both sides.  Inconsistently following commands.  Transfers                    Ambulation/Gait                 Stairs            Wheelchair Mobility    Modified Rankin (Stroke Patients Only)       Balance Overall balance assessment: Needs assistance         Standing balance support: During functional  activity;No upper extremity supported Standing balance-Leahy Scale: Zero Standing balance comment: Vital Go total lift bed achieved 51 degree standing with 91% weight bearing.with total assist of two persons. Nursing present and educated regarding features of bed.  Stood 16 min total.  Pt acknowledged that she did not want to stand further and when she wanted to lay back down.                        Cognition Arousal/Alertness: Awake/alert Behavior During Therapy: Flat affect (Pt was tracking and would go past midline with incr cues) Overall Cognitive Status: Impaired/Different from baseline Area of Impairment: Orientation;Following commands;Safety/judgement;Awareness;Problem solving Orientation Level: Disoriented to;Situation;Time;Place   Memory: Decreased recall of precautions;Decreased short-term memory Following Commands: Follows one step commands with increased time;Follows one step commands inconsistently Safety/Judgement: Decreased awareness of safety   Problem Solving: Slow processing;Decreased initiation;Difficulty sequencing;Requires verbal cues;Requires tactile cues General Comments: Pt much more alert today.  Kept eyes open a lot of session.  Responding to commands intermittently.  Likes gospel music.  Pt with left gaze preference however would track past midline with cues and positioning.  When mom came back into room, pt looked at her  to right when she walked into room.      Exercises General Exercises - Upper Extremity Shoulder Flexion: Both;PROM;5 reps;Supine Elbow Flexion: PROM;Both;5 reps;Supine    General Comments General comments (skin integrity, edema, etc.): Pt on 40% fiO2 with PEEP 5.  BP 152/97 iniitially.  at 15 degrees, 162/110, 30 degrees 168/183 and 41 degrees 170/110.  Nurse not sure if BP's accurate.  HR varied for stayed in 90's most of time.        Pertinent Vitals/Pain Pain Assessment: No/denies painSEE VS above.    Home Living                       Prior Function            PT Goals (current goals can now be found in the care plan section) Acute Rehab PT Goals Patient Stated Goal: unable to state Progress towards PT goals: Progressing toward goals    Frequency    Min 3X/week      PT Plan Current plan remains appropriate    Co-evaluation             End of Session Equipment Utilized During Treatment: Gait belt;Oxygen (on vent) Activity Tolerance: Patient limited by fatigue Patient left: with call bell/phone within reach;with family/visitor present;in bed;with bed alarm set     Time: 0921-1002 PT Time Calculation (min) (ACUTE ONLY): 41 min  Charges:  $Therapeutic Activity: 23-37 mins                    G CodesDenice Paradise 07/02/16, 10:42 AM [facility] [phone] (pager)

## 2016-06-09 NOTE — Evaluation (Signed)
Occupational Therapy Evaluation Patient Details Name: [name] [name] MRN: [id number] DOB: 1980/09/16 Today's Date: 06/09/2016    History of Present Illness [name] with PMH of morbid obesity (469 lbs), DM, HTN and tachycardia who presented to [facility] on 10/3 with reports of abdominal bloating and heavy abdominal pressure.  Intubated 05/29/16 to present.    Clinical Impression   Pt was independent and is a mother to a 42 year old daughter. She is currently intubated and dependent in all mobility and ADL. Pt with generalized weakness, with impaired cognition with preference for L visual field. Pt will need extensive rehab. Will follow acutely.    Follow Up Recommendations  SNF;Supervision/Assistance - 24 hour    Equipment Recommendations       Recommendations for Other Services       Precautions / Restrictions Precautions Precautions: Fall      Mobility Bed Mobility Overal bed mobility: Needs Assistance             General bed mobility comments: Moved pt up in bed with total assist.  Used VitalGo total lift bed to progressively incline pt to 51 degrees with 91% weight bearing on pts LEs.  Once pt at 51 degrees, pt did adjusted shoulders and move UES x 2-3 x but again not to command.  Pt tracking with eyes both sides.  Inconsistently following commands.  Transfers                      Balance Overall balance assessment: Needs assistance         Standing balance support: During functional activity;No upper extremity supported Standing balance-Leahy Scale: Zero Standing balance comment: Vital Go total lift bed achieved 51 degree standing with 91% weight bearing.with total assist of two persons. Nursing present and educated regarding features of bed.  Stood 16 min total.  Pt acknowledged that she did not want to stand further and when she wanted to lay back down.                                ADL                                          General ADL Comments: requires total assist     Vision Additional Comments: L gaze preference and head turn, but able to look toward L with max verbal cues, R eye bloodshot   Perception     Praxis      Pertinent Vitals/Pain Pain Assessment: Faces Faces Pain Scale: No hurt     Hand Dominance     Extremity/Trunk Assessment Upper Extremity Assessment Upper Extremity Assessment: Generalized weakness (full PROM, noted to flex L UE at elbow)   Lower Extremity Assessment Lower Extremity Assessment: Defer to PT evaluation       Communication Communication Communication: No difficulties   Cognition Arousal/Alertness: Awake/alert Behavior During Therapy: Flat affect (using facial expression some to indicate yes and no) Overall Cognitive Status: Impaired/Different from baseline Area of Impairment: Following commands;Attention Orientation Level: Disoriented to;Situation;Time;Place Current Attention Level: Focused Memory: Decreased recall of precautions;Decreased short-term memory Following Commands: Follows one step commands with increased time;Follows multi-step commands inconsistently Safety/Judgement: Decreased awareness of safety   Problem Solving: Slow processing;Decreased initiation;Difficulty sequencing;Requires verbal cues;Requires tactile cues General Comments: Pt much more alert today.  Kept eyes open a lot of session.  Responding to commands intermittently.  Likes gospel music.  Pt with left gaze preference however would track past midline with cues and positioning.  When mom came back into room, pt looked at her to right when she walked into room.     General Comments       Exercises Exercises: General Upper Extremity;General Lower Extremity     Shoulder Instructions      Home Living Family/patient expects to be discharged to:: Private residence Living Arrangements: Parent Available Help at Discharge: Family;Available PRN/intermittently Type of Home:  House Home Access: Level entry     Home Layout: One level     Bathroom Shower/Tub: Occupational psychologist: Standard     Home Equipment: None   Additional Comments: Pt has a 57 year old daughter.  Pt was going to move into [place] soon per mom      Prior Functioning/Environment Level of Independence: Independent                 OT Problem List: Decreased strength;Decreased activity tolerance;Impaired balance (sitting and/or standing);Impaired vision/perception;Decreased coordination;Decreased cognition;Decreased knowledge of use of DME or AE;Cardiopulmonary status limiting activity;Obesity;Impaired UE functional use   OT Treatment/Interventions: Self-care/ADL training;Therapeutic exercise;DME and/or AE instruction;Therapeutic activities;Cognitive remediation/compensation;Patient/family education;Balance training;Visual/perceptual remediation/compensation    OT Goals(Current goals can be found in the care plan section) Acute Rehab OT Goals Patient Stated Goal: unable to state OT Goal Formulation: Patient unable to participate in goal setting Time For Goal Achievement: 06/23/16 Potential to Achieve Goals: Good ADL Goals Pt/caregiver will Perform Home Exercise Program: Increased strength;Both right and left upper extremity;With minimal assist (with family assisting) Additional ADL Goal #1: Pt will follow one step commands 50% of requests. Additional ADL Goal #2: Pt will locate/track visual targets in R visual field with minimal verbal cues. Additional ADL Goal #3: Pt will perform hand to face with B UEs as a precursor to grooming/self feeding. Additional ADL Goal #4: Pt will roll with max assist for positioning and pericare.  OT Frequency: Min 2X/week   Barriers to D/C:            Co-evaluation PT/OT/SLP Co-Evaluation/Treatment: Yes Reason for Co-Treatment: Complexity of the patient's impairments (multi-system involvement);For patient/therapist safety   OT  goals addressed during session: Strengthening/ROM      End of Session    Activity Tolerance: Patient tolerated treatment well Patient left: in bed;with call bell/phone within reach   Time: 0921-1002 OT Time Calculation (min): 41 min Charges:  OT General Charges $OT Visit: 1 Procedure OT Evaluation $OT Eval High Complexity: 1 Procedure G-Codes:    Malka So 06/09/2016, 11:03 AM  [phone]

## 2016-06-09 NOTE — Progress Notes (Signed)
Pharmacy Antibiotic Note  [name] [name] is a 35 y.o. female admitted on 05/25/2016 with pneumonia.  Pharmacy has been consulted for vancomycin and Zosyn dosing.  Plan: -Zosyn 3.375g IV q8h EI -Vancomycin 2553m IV x1, then 12550mIV q12h -F/u c/s, renal function, clinical progression, vancomycin trough at steady state (goal 15-2013mmL)  Height: _0  (160 cm) Weight: (!) 314 lb 8 oz (142.7 kg) IBW/kg (Calculated) : 52.4  Temp (24hrs), Avg:99.8 F (37.7 C), Min:98.7 F (37.1 C), Max:101.7 F (38.7 C)   Recent Labs Lab 06/04/16 0455 06/05/16 0842 06/06/16 0500 06/07/16 0454 06/08/16 0342 06/09/16 0458  WBC 7.0  --  6.6 6.1 6.0 5.5  CREATININE 1.25* 1.26* 1.14* 1.06* 1.09* 1.30*    Estimated Creatinine Clearance: 83.6 mL/min (by C-G formula based on SCr of 1.3 mg/dL (H)).    No Known Allergies  Antimicrobials this admission:  Flucon oral for thrush 10/5>>10/13 Zosyn 10/18>> Vanc 10/18>>  Dose adjustments this admission: n/a  Microbiology results:  10/5 MRSA PCR: neg 10/17 TA: few GPC, mod GVR 10/18 BCx:   Thank you for allowing pharmacy to be a part of this patient's care.  Dawnelle Warman D. Coryn Mosso, PharmD, BCPS Clinical Pharmacist Pager: [phone]/18/2017 9:43 AM

## 2016-06-09 NOTE — Progress Notes (Signed)
PULMONARY / CRITICAL CARE MEDICINE   Name: [name] [name] MRN: [id number] DOB: 11-17-1980    ADMISSION DATE:  05/25/2016 CONSULTATION DATE:  05/27/16  REFERRING MD:  Dr. Ree Kida - [facility]  CHIEF COMPLAINT:  Altered Mental Status   BRIEF: [name] with PMH of morbid obesity (469 lbs), DM, HTN and tachycardia who presented to [facility] on 10/3 with acute on chronic respiratory failure with hypercarbia and hypoxemia due to diastolic CHF exacerbation and obesity hypoventilation syndrome.  STUDIES:  ECHO 10/4 >> EF 55%, PAP 42  CULTURES: Trache 10/17 > Blood 10/18 >   ANTIBIOTICS: Vanc 10/18 > Zosyn 10/18 >   SIGNIFICANT EVENTS: 10/3  Admit 10/5  Transferred to ICU due to AMS, hypercarbic respiratory failure 05/28/16:  RN reports pt remains on bipap, ntg gtt continues & BP remains elevated in 226'J systolic.  Net negative 5.5 L in last 24 hours with improvement in sr cr  05/29/16 - intubated overnight .  10/7 > 10/14 diuresed aggressively 10/14 - attempting wean 10/15 weaning  SUBJECTIVE Pt awake, not following commands. Allegedly has been like this for a week now.  Goes bradypneic on PST Tongue less swollen Fever overnight.    VITAL SIGNS: BP (!) 150/100 (BP Location: Left Arm)   Pulse 99   Temp 99.1 F (37.3 C) (Axillary)   Resp (!) 36   Ht _0  (1.6 m)   Wt (!) 142.7 kg (314 lb 8 oz)   LMP  (LMP Unknown)   SpO2 96%   BMI 55.71 kg/m   HEMODYNAMICS: CVP:  [5 mmHg-11 mmHg] 5 mmHg  VENTILATOR SETTINGS: Vent Mode: PRVC FiO2 (%):  [40 %] 40 % Set Rate:  [25 bmp] 25 bmp Vt Set:  [420 mL] 420 mL PEEP:  [5 cmH20] 5 cmH20 Plateau Pressure:  [20 cmH20-25 cmH20] 22 cmH20  INTAKE / OUTPUT: I/O last 3 completed shifts: In: 4269.7 [I.V.:264.7; Other:350; NG/GT:3205; IV Piggyback:450] Out: 3354 [Urine:7085]  PHYSICAL EXAMINATION: General: awake. Opens eyes and has visual threat. Pupils 3 mm reactive. (+) erythema in R eye. Does not follow other commands.  HENT: NCAT ETT in  place. Swollen tongue.  PULM:  Dec BS bases. Distant BS 2/2 obesity.  CV: RRR, no mgr GI: belly soft, nontender Derm: Gr 1` leg edema Neuro: sedated. Not following commands. Eye opening.   LABS: PULMONARY  Recent Labs Lab 06/03/16 0523 06/04/16 0415 06/05/16 0350 06/08/16 1030  PHART 7.362 7.364 7.341* 7.306*  PCO2ART 80.3* 81.1* 82.8* 86.1*  PO2ART 70.3* 69.9* 63.2* 80.3*  HCO3 44.5* 45.2* 43.6* 41.7*  O2SAT 92.3 92.3 88.5 93.7   CBC  Recent Labs Lab 06/07/16 0454 06/08/16 0342 06/09/16 0458  HGB 12.7 13.1 13.2  HCT 45.3 45.9 45.4  WBC 6.1 6.0 5.5  PLT 246 236 220   COAGULATION No results for input(s): INR in the last 168 hours. CARDIAC   No results for input(s): TROPONINI in the last 168 hours. No results for input(s): PROBNP in the last 168 hours.  CHEMISTRY  Recent Labs Lab 06/03/16 0425 06/04/16 0455 06/05/16 0842 06/06/16 0500 06/07/16 0454 06/08/16 0342 06/09/16 0458  NA 148* 150* 151* 150* 153* 151* 149*  K 3.6 3.7 3.5 3.8 3.8 4.3 4.4  CL 97* 99* 101 101 104 102 100*  CO2 44* 43* 42* 39* 41* 39* 38*  GLUCOSE 93 116* 126* 114* 145* 172* 190*  BUN 40* 48* 61* 68* 69* 65* 83*  CREATININE 1.16* 1.25* 1.26* 1.14* 1.06* 1.09* 1.30*  CALCIUM 8.8*  9.4 9.6 9.7 9.9 9.9 10.2  MG 2.0 2.2 2.5* 2.6*  --  2.7* 2.8*  PHOS 3.2 3.1 4.0  --   --  3.3 5.2*   Estimated Creatinine Clearance: 83.6 mL/min (by C-G formula based on SCr of 1.3 mg/dL (H)).  LIVER No results for input(s): AST, ALT, ALKPHOS, BILITOT, PROT, ALBUMIN, INR in the last 168 hours. INFECTIOUS No results for input(s): LATICACIDVEN, PROCALCITON in the last 168 hours. ENDOCRINE CBG (last 3)   Recent Labs  06/09/16 0017 06/09/16 0425 06/09/16 0752  GLUCAP 147* 182* 193*     DISCUSSION: [name] with PMH of super morbid obesity, HTN, pulmonary hypertension admitted with decompensated diastolic CHF.     PULMONARY A: Acute on Chronic Hypoxemic and Hypercarbic Respiratory  Failure Concern for HCAP as pt has been on the vent x 13 days and she is failing PST despite diuresis. CXR is always suboptimal 2/2 body habitus. Now with fever.  Secondary pulmonary hypertension Severe OSA / OHS with acute Decompensation - sleep study in 2012 with up to 75 events per hour with desaturation to 72% Tongue swelling since 10/13 compromising airway. Swelling is better.   P:   Pt is on the vent x 14days and she is failing PST. Mental status is not optimal.  Tongue is swollen but better. Concern for HCAP. Plan for trache this Friday per ENT.  Will d/c diuresis. Start abx for possible HCAP.  PST as able, no extubation.  VAP prevention O2 as needed to support sats > 88% Cont  steroids and benadryl and h2 blocker  for tongue swelling > not sure if allergic to a new medication. Allegedly no new recent medication.    CARDIOVASCULAR A:  Hypertensive Urgency > resolved Pulm edema/Volume overload > lost 47 kgs Acute diastolic heart failure Demand ischemia Hx HTN Long pauses on telemetry in evenings P:  Hydralazine 75 mg TID  B-blocker per cardiology Amlodipine per cardiology Goal SBP < 150 per RN Tele Will d/c diuretics  RENAL A:   AKI / Azotemia 2/2 diuresis Hypernatremia Hypokalemia P:   Monitor BMET and UOP Replace electrolytes as needed D/c diuretics D/c free water  GASTROINTESTINAL A:   Morbid obesity Nutrition P:   TF per nutrition  D/c free water Outpatient obesity clinic referral  HEMATOLOGIC A:   VTE prophylaxis P:  SCD's / Lovenox for DVT prophylaxis CBC daily  INFECTIOUS A:   Possible hcap P:   Start vanc/zosyn Send for cultures Check PCT   ENDOCRINE A:   No acute issues P:   No interventions required  NEUROLOGIC A:   Acute encephalopathy - in the setting of hypercarbia. Pt noted to be awake and not following commands at least the last week. Unsure etiology. Likely multifactorial : sedatives/infection/hypercapnea   Depression - family reports pt is in "denial" - seen by Psych 05/28/16  P:   Sedation> titrated to RASS -1 Fentanyl gtt/versed prn May need MRI   FAMILY  - Updates: Family updated at length on 10/14 and 10/15. Mother updated at bedside on 10/17.   - Inter-disciplinary family meet or Palliative Care meeting due by: 10/12  Critical Care time with this patient today : 30 minutes.    Monica Becton, MD 06/09/2016, 9:01 AM [place] [facility] Pager [phone] After 3 pm or if no answer, call [phone]

## 2016-06-10 DIAGNOSIS — J810 Acute pulmonary edema: Secondary | ICD-10-CM

## 2016-06-10 DIAGNOSIS — I313 Pericardial effusion (noninflammatory): Secondary | ICD-10-CM

## 2016-06-10 DIAGNOSIS — J9601 Acute respiratory failure with hypoxia: Secondary | ICD-10-CM

## 2016-06-10 DIAGNOSIS — J189 Pneumonia, unspecified organism: Secondary | ICD-10-CM

## 2016-06-10 LAB — BASIC METABOLIC PANEL
Anion gap: 8 (ref 5–15)
BUN: 90 mg/dL — ABNORMAL HIGH (ref 6–20)
CO2: 36 mmol/L — ABNORMAL HIGH (ref 22–32)
Calcium: 9.8 mg/dL (ref 8.9–10.3)
Chloride: 109 mmol/L (ref 101–111)
Creatinine, Ser: 1.24 mg/dL — ABNORMAL HIGH (ref 0.44–1.00)
GFR calc Af Amer: >60 mL/min (ref >60)
GFR calc non Af Amer: 55 mL/min — ABNORMAL LOW (ref >60)
Glucose, Bld: 135 mg/dL — ABNORMAL HIGH (ref 65–99)
Potassium: 4.2 mmol/L (ref 3.5–5.1)
Sodium: 153 mmol/L — ABNORMAL HIGH (ref 135–145)

## 2016-06-10 LAB — PHOSPHORUS: Phosphorus: 4.3 mg/dL (ref 2.5–4.6)

## 2016-06-10 LAB — CULTURE, RESPIRATORY W GRAM STAIN
Culture: NORMAL
Special Requests: NORMAL

## 2016-06-10 LAB — CBC
HCT: 47.9 % — ABNORMAL HIGH (ref 36.0–46.0)
Hemoglobin: 13.7 g/dL (ref 12.0–15.0)
MCH: 24 pg — ABNORMAL LOW (ref 26.0–34.0)
MCHC: 28.6 g/dL — ABNORMAL LOW (ref 30.0–36.0)
MCV: 83.9 fL (ref 78.0–100.0)
Platelets: 230 10*3/uL (ref 150–400)
RBC: 5.71 MIL/uL — ABNORMAL HIGH (ref 3.87–5.11)
RDW: 22.4 % — ABNORMAL HIGH (ref 11.5–15.5)
WBC: 6.8 10*3/uL (ref 4.0–10.5)

## 2016-06-10 LAB — GLUCOSE, CAPILLARY
Glucose-Capillary: 129 mg/dL — ABNORMAL HIGH (ref 65–99)
Glucose-Capillary: 144 mg/dL — ABNORMAL HIGH (ref 65–99)
Glucose-Capillary: 110 mg/dL — ABNORMAL HIGH (ref 65–99)
Glucose-Capillary: 147 mg/dL — ABNORMAL HIGH (ref 65–99)
Glucose-Capillary: 126 mg/dL — ABNORMAL HIGH (ref 65–99)
Glucose-Capillary: 139 mg/dL — ABNORMAL HIGH (ref 65–99)

## 2016-06-10 LAB — PROCALCITONIN: Procalcitonin: <0.1 ng/mL

## 2016-06-10 LAB — MAGNESIUM: Magnesium: 2.6 mg/dL — ABNORMAL HIGH (ref 1.7–2.4)

## 2016-06-10 MED ORDER — FREE WATER
200.0000 mL | Freq: Three times a day (TID) | Status: DC
  Administered 2016-06-10 – 2016-06-11 (×4): 200 mL

## 2016-06-10 NOTE — Progress Notes (Signed)
Patient Name: [name] [name] Date of Encounter: 06/10/2016  Primary Cardiologist: Reola Calkins [facility] Problem List     Principal Problem:   Acute congestive heart failure [facility]) Active Problems:   Malignant hypertension   Morbid obesity (HCC)   OSA (obstructive sleep apnea)   Renal insufficiency   Acute pulmonary edema (HCC)   Abdominal pain   Hepatic congestion   Sinus tachycardia   Pericardial effusion   Pulmonary hypertension   Acute respiratory failure with hypoxemia (HCC)   Difficult intravenous access   Sinus pause     Subjective   Remains intubated however she is more alert today, able to move all extremities. Following commands.  Inpatient Medications    Scheduled Meds: . amLODipine  10 mg Per Tube Daily  . chlorhexidine gluconate (MEDLINE KIT)  15 mL Mouth Rinse BID  . docusate  100 mg Per Tube BID  . enoxaparin (LOVENOX) injection  70 mg Subcutaneous Q24H  . famotidine (PEPCID) IV  20 mg Intravenous Q12H  . feeding supplement (PRO-STAT SUGAR FREE 64)  60 mL Per Tube TID  . feeding supplement (VITAL HIGH PROTEIN)  1,000 mL Per Tube Q24H  . hydrALAZINE  75 mg Oral Q8H  . insulin aspart  2-6 Units Subcutaneous Q4H  . isosorbide dinitrate  30 mg Oral BID  . magnesium hydroxide  30 mL Per Tube Daily  . mouth rinse  15 mL Mouth Rinse 10 times per day  . piperacillin-tazobactam (ZOSYN)  IV  3.375 g Intravenous Q8H  . polyethylene glycol  17 g Oral Daily  . potassium chloride  40 mEq Per Tube BID  . sodium chloride flush  10-40 mL Intracatheter Q12H  . sodium chloride flush  3 mL Intravenous Q12H  . THROMBI-PAD  1 each Topical Once  . vancomycin  1,250 mg Intravenous Q12H   Continuous Infusions: . fentaNYL infusion INTRAVENOUS 25 mcg/hr (06/09/16 2000)   PRN Meds:.acetaminophen (TYLENOL) oral liquid 160 mg/5 mL, fentaNYL (SUBLIMAZE) injection, hydrALAZINE, midazolam, ondansetron **OR** ondansetron (ZOFRAN) IV, sodium chloride flush   Vital Signs      Vitals:   06/10/16 0600 06/10/16 0700 06/10/16 0727 06/10/16 0736  BP: (!) 150/95 (!) 147/92  (!) 147/92  Pulse: 94 93  95  Resp: (!) 25 (!) 25  (!) 25  Temp:   98.6 F (37 C)   TempSrc:   Oral   SpO2: 96% 95%  95%  Weight:      Height:        Intake/Output Summary (Last 24 hours) at 06/10/16 0820 Last data filed at 06/10/16 0700  Gross per 24 hour  Intake             2285 ml  Output             2260 ml  Net               25 ml   Filed Weights   06/07/16 0442 06/08/16 0455 06/09/16 0400  Weight: (!) 338 lb (153.3 kg) (!) 332 lb 8 oz (150.8 kg) (!) 314 lb 8 oz (142.7 kg)    Physical Exam    GEN: Sedate on vent HEENT: Grossly normal. ETT.Right eye with scleral hematoma Neck: Supple, no JVD, carotid bruits, or masses. Cardiac: Regular rhythm, tachycardic, no murmurs, rubs, or gallops. No clubbing, cyanosis, chronic appearing edema.  Radials/DP/PT 2+ and equal bilaterally.  Respiratory: vent noise B GI: Soft, nontender, nondistended, BS + x 4. obese MS: no deformity or  atrophy. Skin: warm and dry, no rash. Neuro:  More alert today, following commands, moving extremities Psyc: More alert  Labs    CBC  Recent Labs  06/09/16 0458 06/10/16 0500  WBC 5.5 6.8  HGB 13.2 13.7  HCT 45.4 47.9*  MCV 83.3 83.9  PLT 220 622   Basic Metabolic Panel  Recent Labs  06/09/16 0458 06/10/16 0500  NA 149* 153*  K 4.4 4.2  CL 100* 109  CO2 38* 36*  GLUCOSE 190* 135*  BUN 83* 90*  CREATININE 1.30* 1.24*  CALCIUM 10.2 9.8  MG 2.8* 2.6*  PHOS 5.2* 4.3    Telemetry    Has had up to 11 second pauses with cough, strain (vagal/OSA, reviewed with Dr. Rayann Heman), now NSR, ST - Personally Reviewed  ECG    NSR, PRWP- Personally Reviewed  Radiology    No results found.   Cardiac Studies   CT scan chest: No PE, but dilated pulmonary artery (78m).  ECHO: - Left ventricle: The cavity size was normal. Systolic function was   normal. The estimated ejection fraction was  in the range of 50%   to 55%. Regional wall motion abnormalities cannot be excluded.   The study is not technically sufficient to allow evaluation of LV   diastolic function. - Left atrium: The atrium was mildly dilated. - Right atrium: The atrium was mildly dilated. - Pulmonary arteries: Systolic pressure was mildly increased. PA   peak pressure: 42 mm Hg (S).  Impressions:  - Extremely limited; definity used; probable low normal LV sstolci   function; mild biatrial enlargement; mild TR; mildly elevated   pulmonary pressure; suggest MUGA or cardiac MRI if clinically   indicated.  Patient Profile     35year old with super morbid obesity, secondary pulmonary hypertension, acute diastolic heart failure, OSA/Obesity hypoventilation, hypercarbic respiratory failure.  Assessment & Plan    Acute diastolic HF  - ECHO with normal EF, mild pulm HTN.   - Stopped lasix 10/18, BUN and creat elevated. BUN today is slightly up, creatinine stable. Continue to hold Lasix as well as Diamox  - CVP 4.  - Discussed with CCM team  Obesity  - Secondary pulm htn (dilated pulm art)  - weight loss  - Trach discussed, Dr. BRedmond Basemannote reviewed  Secondary pulm htn  - as above  - weight loss  - BP control  - small pericardial effusion should be of no clinical concern.   Elevated troponin/ demand ischemia  - low level troponin secondary to acute heart strain/failure.   Essential hypertension  - BP elevated again despite aggressive diuresis, see meds.   Sinus Pause   - 11 second at one point - Vagal/OSA. No pacer indicated, discussed with Dr. ARayann Heman EP.   - stopped metoprolol 10/14  - we are seeing some sinus tachycardia. Will tolerate.   AKI  - Continue to hold lasix and diamox for now  Encouraging mental status currently.  Signed, MCandee Furbish MD  06/10/2016, 8:20 AM

## 2016-06-10 NOTE — Progress Notes (Signed)
PULMONARY / CRITICAL CARE MEDICINE   Name: [name] [name] MRN: [id number] DOB: 1981/06/03    ADMISSION DATE:  05/25/2016 CONSULTATION DATE:  05/27/16  REFERRING MD:  Dr. Ree Kida - [facility]  CHIEF COMPLAINT:  Altered Mental Status   BRIEF: [name] with PMH of morbid obesity (469 lbs), DM, HTN and tachycardia who presented to [facility] on 10/3 with acute on chronic respiratory failure with hypercarbia and hypoxemia due to diastolic CHF exacerbation and obesity hypoventilation syndrome.  STUDIES:  ECHO 10/4 >> EF 55%, PAP 42  CULTURES: Trache 10/17 > Blood 10/18 >  Trache 10/19 >   ANTIBIOTICS: Vanc 10/18 > Zosyn 10/18 >   SIGNIFICANT EVENTS: 10/3  Admit 10/5  Transferred to ICU due to AMS, hypercarbic respiratory failure 05/28/16:  RN reports pt remains on bipap, ntg gtt continues & BP remains elevated in 297'L systolic.  Net negative 5.5 L in last 24 hours with improvement in sr cr  05/29/16 - intubated overnight .  10/7 > 10/14 diuresed aggressively 10/14 - attempting wean 10/15 weaning  SUBJECTIVE Pt has been more awake last 24 hrs, on and off, follows commands.  Doing PST > mildly tachypneic with alra flaring. More purulent secretions per ET last 24 hrs.   VITAL SIGNS: BP (!) 147/92   Pulse 95   Temp 98.6 F (37 C) (Oral)   Resp (!) 25   Ht _0  (1.6 m)   Wt (!) 142.7 kg (314 lb 8 oz)   LMP  (LMP Unknown)   SpO2 95%   BMI 55.71 kg/m   HEMODYNAMICS: CVP:  [7 mmHg-15 mmHg] 10 mmHg  VENTILATOR SETTINGS: Vent Mode: CPAP;PSV FiO2 (%):  [40 %] 40 % Set Rate:  [25 bmp] 25 bmp Vt Set:  [420 mL] 420 mL PEEP:  [5 cmH20] 5 cmH20 Pressure Support:  [5 cmH20] 5 cmH20 Plateau Pressure:  [22 cmH20] 22 cmH20  INTAKE / OUTPUT: I/O last 3 completed shifts: In: 8921 [I.V.:190; Other:170; NG/GT:2350; IV Piggyback:1150] Out: 4085 [Urine:4085]  PHYSICAL EXAMINATION: General: awake. Awake, follows commands. Episodic though.  (+) erythema in R eye 2/2 coughing hard last week.    HENT: NCAT ETT in place. Significanlty less swollen tongue.  PULM:  Dec BS bases. Distant BS 2/2 obesity.  CV: RRR, no mgr GI: belly soft, nontender Derm: Gr 1` leg edema Neuro: sedated. Not following commands. Eye opening.   LABS: PULMONARY  Recent Labs Lab 06/04/16 0415 06/05/16 0350 06/08/16 1030  PHART 7.364 7.341* 7.306*  PCO2ART 81.1* 82.8* 86.1*  PO2ART 69.9* 63.2* 80.3*  HCO3 45.2* 43.6* 41.7*  O2SAT 92.3 88.5 93.7   CBC  Recent Labs Lab 06/08/16 0342 06/09/16 0458 06/10/16 0500  HGB 13.1 13.2 13.7  HCT 45.9 45.4 47.9*  WBC 6.0 5.5 6.8  PLT 236 220 230   COAGULATION No results for input(s): INR in the last 168 hours. CARDIAC   No results for input(s): TROPONINI in the last 168 hours. No results for input(s): PROBNP in the last 168 hours.  CHEMISTRY  Recent Labs Lab 06/04/16 0455 06/05/16 0842 06/06/16 0500 06/07/16 0454 06/08/16 0342 06/09/16 0458 06/10/16 0500  NA 150* 151* 150* 153* 151* 149* 153*  K 3.7 3.5 3.8 3.8 4.3 4.4 4.2  CL 99* 101 101 104 102 100* 109  CO2 43* 42* 39* 41* 39* 38* 36*  GLUCOSE 116* 126* 114* 145* 172* 190* 135*  BUN 48* 61* 68* 69* 65* 83* 90*  CREATININE 1.25* 1.26* 1.14* 1.06* 1.09* 1.30* 1.24*  CALCIUM 9.4 9.6 9.7 9.9 9.9 10.2 9.8  MG 2.2 2.5* 2.6*  --  2.7* 2.8* 2.6*  PHOS 3.1 4.0  --   --  3.3 5.2* 4.3   Estimated Creatinine Clearance: 87.6 mL/min (by C-G formula based on SCr of 1.24 mg/dL (H)).  LIVER No results for input(s): AST, ALT, ALKPHOS, BILITOT, PROT, ALBUMIN, INR in the last 168 hours. INFECTIOUS  Recent Labs Lab 06/09/16 0911 06/10/16 0500  PROCALCITON 0.10 <0.10   ENDOCRINE CBG (last 3)   Recent Labs  06/09/16 2330 06/10/16 0501 06/10/16 0729  GLUCAP 126* 129* 144*     DISCUSSION: [name] with PMH of super morbid obesity, HTN, pulmonary hypertension admitted with decompensated diastolic CHF.     PULMONARY A: Acute on Chronic Hypoxemic and Hypercarbic Respiratory  Failure Concern for HCAP as pt has been on the vent x 13 days and she is failing PST despite diuresis. CXR is always suboptimal 2/2 body habitus. Now with fever and increased purulent secretions per ET last 24 hrs.  Secondary pulmonary hypertension Severe OSA / OHS with acute Decompensation - sleep study in 2012 with up to 75 events per hour with desaturation to 72% Tongue swelling since 10/13 compromising airway. Swelling is better.   P:   I still recommend tracheostomy on this pt. On vent x 15 days and there is concern now for HCAP and she is failing PST. Plan for trache by ENT this Friday.  Cont abx for now. Observe fever trend post trache. If better and cultures are (-), can deescalate abx.  Hold off on diuresis. PST as able, no extubation.  VAP prevention O2 as needed to support sats > 88% Off steroids and H2 and H1 blocker for tongue swelling on 10/19 > observe tongue for now.   CARDIOVASCULAR A:  Hypertensive Urgency > resolved Pulm edema/Volume overload > lost 47 kgs at her peak Acute diastolic heart failure Demand ischemia Hx HTN Long pauses on telemetry in evenings P:  Hydralazine 75 mg TID  B-blocker per cardiology Amlodipine per cardiology Goal SBP < 150 per RN Tele Off diuretics since 10/18  RENAL A:   AKI / Azotemia 2/2 diuresis Hypernatremia Hypokalemia P:   Monitor BMET and UOP Replace electrolytes as needed Off diuretics since 10/18 Restart some free water with TF Keep euvolemic  GASTROINTESTINAL A:   Morbid obesity Nutrition P:   TF per nutrition  Outpatient obesity clinic referral  HEMATOLOGIC A:   VTE prophylaxis P:  SCD's / Lovenox for DVT prophylaxis CBC daily  INFECTIOUS A:   Possible hcap. P:   cont vanc/zosyn F/u cultures PCT is N. Observe fever pattern post trache. If better, deescalate or stop abx if cultures remain (-).    ENDOCRINE A:   No acute issues P:   No interventions required  NEUROLOGIC A:   Acute  encephalopathy - in the setting of hypercarbia.  On and off AMS, sometimes she follows commands, sometimes she does not > meds related,  not sure if related to psych issues (pt with depression)  Depression - family reports pt is in "denial" - seen by Psych 05/28/16  P:   Sedation> titrated to RASS -1 Fentanyl gtt/versed prn    FAMILY  - Updates: Family updated at length on 10/14 and 10/15. Mother updated at bedside on 10/17. No family at bedside today  - Inter-disciplinary family meet or Palliative Care meeting due by: 10/12  Critical Care time with this patient today : 30 minutes.  Monica Becton, MD 06/10/2016, 9:30 AM [place] [facility] Pager [phone] After 3 pm or if no answer, call [phone]

## 2016-06-10 NOTE — Progress Notes (Signed)
Occupational Therapy Treatment Patient Details Name: [name] [name] MRN: [id number] DOB: [birth year] Today's Date: 06/10/2016    History of present illness [name] with PMH of morbid obesity (469 lbs), DM, HTN and tachycardia who presented to [facility] on 10/3 with reports of abdominal bloating and heavy abdominal pressure.  Intubated 05/29/16 to present.    OT comments  Pt with lethargy, weaning from vent today. Performed PROM B UEs all areas and gentle neck ROM.   Follow Up Recommendations  SNF;Supervision/Assistance - 24 hour    Equipment Recommendations       Recommendations for Other Services      Precautions / Restrictions         Mobility Bed Mobility                  Transfers                      Balance                                   ADL                                                Vision                     Perception     Praxis      Cognition   Behavior During Therapy: Flat affect Overall Cognitive Status: Impaired/Different from baseline Area of Impairment: Following commands;Attention   Current Attention Level: Focused    Following Commands:  (Not following commands)       General Comments: Unable to rouse pt with music, loud talking or cold wash cloth except for a few seconds    Extremity/Trunk Assessment               Exercises General Exercises - Upper Extremity Shoulder Flexion: Both;PROM;Supine;10 reps Shoulder ABduction: Supine;PROM;10 reps;Both Shoulder Horizontal ABduction: PROM;Both;10 reps;Supine Elbow Flexion: PROM;Both;Supine;10 reps Elbow Extension: PROM;Both;10 reps;Supine Wrist Flexion: Supine;PROM;10 reps;Both Wrist Extension: PROM;Both;10 reps;Supine Digit Composite Flexion: PROM;Both;10 reps;Supine Composite Extension: PROM;Both;10 reps;Supine   Shoulder Instructions       General Comments      Pertinent Vitals/ Pain       Pain Assessment:  Faces Faces Pain Scale: No hurt  Home Living                                          Prior Functioning/Environment              Frequency  Min 2X/week        Progress Toward Goals  OT Goals(current goals can now be found in the care plan section)  Progress towards OT goals: Not progressing toward goals - comment (lethargic)  Acute Rehab OT Goals Patient Stated Goal: unable to state Time For Goal Achievement: 06/23/16 Potential to Achieve Goals: Good  Plan Frequency remains appropriate;Discharge plan remains appropriate    Co-evaluation                 End of Session     Activity Tolerance Patient limited by lethargy   Patient Left in bed;with  call bell/phone within reach   Nurse Communication  (pt is weaning, more fatigued today)        Time: 8115-7262 OT Time Calculation (min): 20 min  Charges: OT General Charges $OT Visit: 1 Procedure OT Treatments $Therapeutic Activity: 8-22 mins  Malka So 06/10/2016, 3:25 PM  [phone]

## 2016-06-10 NOTE — Progress Notes (Signed)
Nutrition Follow-up  DOCUMENTATION CODES:   Morbid obesity  INTERVENTION:   Continue:  Vital High Protein @ 25 ml/hr (600 ml/day) Prostat to 60 ml TID Provides: 1200 kcal, 142 grams protein, and 501 ml H2O  NUTRITION DIAGNOSIS:   Inadequate oral intake related to inability to eat as evidenced by NPO status. Ongoing.   GOAL:   Provide needs based on ASPEN/SCCM guidelines Met.   MONITOR:   Vent status, Labs, Weight trends, Skin, I & O's  ASSESSMENT:   36 y/o F with PMH of morbid obesity (469 lbs), DM, HTN and tachycardia who presented to [facility] on 10/3 with reports of abdominal bloating and heavy abdominal pressure.    Weight has decreased by 155 lb since her highest weight Pt is -46 L Pt is not weaning and concern now for HCAP, plan for trach by ENT Friday. Plan for LTACH at d/c per therapies.   Patient is currently intubated on ventilator support  Medications reviewed and include: colace, MOM, miralax Labs reviewed: Na 153 CBG's: 110-144 Free water 200 ml every 8 hours = 600 ml  Diet Order:  Diet NPO time specified Diet NPO time specified  Skin:  Wound (see comment) (wound on R/L tibial)  Last BM:  10/19 loose per RN - Per I/O 100 ml via rectal tube 10/15  Height:   Ht Readings from Last 1 Encounters:  06/08/16 5' 3" (1.6 m)    Weight:   Wt Readings from Last 1 Encounters:  06/09/16 (!) 314 lb 8 oz (142.7 kg)    Ideal Body Weight:  52.27 kg  BMI:  Body mass index is 55.71 kg/m.  Estimated Nutritional Needs:   Kcal:  1150-1307  Protein:  >/= 130 grams  Fluid:  Per MD  EDUCATION NEEDS:   Education needs no appropriate at this time  [place], [place], [place] Pager [phone] After Hours Pager

## 2016-06-10 NOTE — Progress Notes (Signed)
Physical Therapy Treatment Patient Details Name: [name] [name] MRN: [id number] DOB: 10-22-1980 Today's Date: 06/10/2016    History of Present Illness [name] with PMH of morbid obesity (469 lbs), DM, HTN and tachycardia who presented to [facility] on 10/[facility] with reports of abdominal bloating and heavy abdominal pressure.  Intubated 05/29/16 to present.     PT Comments    Pt admitted with above diagnosis. Pt currently with functional limitations due to balance and endurance deficits. Pt was able to tolerate tilt bed in 60 degree standing for 25 minutes.  Pt was again following some commands but inconsistently.  Pt will answer yes nos at times but other times, blank stare.  Will continue and progress pt as able.  Pt will benefit from skilled PT to increase their independence and safety with mobility to allow discharge to the venue listed below.    Follow Up Recommendations  LTACH;Supervision/Assistance - 24 hour     Equipment Recommendations  Other (comment) (TBA)    Recommendations for Other Services       Precautions / Restrictions Precautions Precautions: Fall Restrictions Weight Bearing Restrictions: No    Mobility  Bed Mobility Overal bed mobility: Needs Assistance             General bed mobility comments: Moved pt up in bed with total assist.  Used VitalGo total lift bed to progressively incline pt to 60 degrees.  Once pt at 60 degrees, pt did adjust shoulders and move UES x 2-3 x  to command.  Pt tracking with eyes but difficult to get her to track much past midline to right.  Inconsistently following commands.  Transfers                    Ambulation/Gait                 Stairs            Wheelchair Mobility    Modified Rankin (Stroke Patients Only)       Balance Overall balance assessment: Needs assistance         Standing balance support: No upper extremity supported;During functional activity Standing balance-Leahy Scale:  Zero Standing balance comment: Vital Go lift bed achieved 60 degree position with total assist of 2 persons. Nurse present and educated regarding the bed and straps. Pt stood 25 min total                    Cognition Arousal/Alertness: Awake/alert Behavior During Therapy: Flat affect (using facial expression some to indicate yes and no) Overall Cognitive Status: Impaired/Different from baseline Area of Impairment: Following commands;Attention   Current Attention Level: Focused   Following Commands: Follows one step commands with increased time;Follows one step commands inconsistently Safety/Judgement: Decreased awareness of safety   Problem Solving: Slow processing;Decreased initiation;Difficulty sequencing;Requires verbal cues;Requires tactile cues General Comments: Pt much more alert today.  Kept eyes open a lot of session.  Responding to commands intermittently.  Did not want to listen to her music.  Pt with left gaze preference however would track past midline with cues and positioning.      Exercises General Exercises - Upper Extremity Shoulder Flexion: Both;PROM;5 reps;Supine Elbow Flexion: PROM;Both;5 reps;Supine General Exercises - Lower Extremity Ankle Circles/Pumps: AROM;Both;5 reps;Supine (Pt moved feet x3 to command)    General Comments General comments (skin integrity, edema, etc.): Pt on 40% FiO2 and PEEP 5. All VSS today with BP 156/99, HR 110-126 bpm, O2 sat >90%  throughtou      Pertinent Vitals/Pain Pain Assessment: Faces Faces Pain Scale: Hurts little more Pain Location: grimacing with UE and LE movement, c/o neck pain Pain Descriptors / Indicators: Aching;Grimacing;Guarding Pain Intervention(s): Limited activity within patient's tolerance;Monitored during session;Repositioned  See above VS.  Home Living                      Prior Function            PT Goals (current goals can now be found in the care plan section) Acute Rehab PT  Goals Patient Stated Goal: unable to state Progress towards PT goals: Progressing toward goals    Frequency    Min 3X/week      PT Plan Discharge plan needs to be updated    Co-evaluation             End of Session Equipment Utilized During Treatment: Gait belt;Oxygen (on vent) Activity Tolerance: Patient limited by fatigue Patient left: with call bell/phone within reach;with family/visitor present;in bed;with bed alarm set     Time: 6237-6283 PT Time Calculation (min) (ACUTE ONLY): 48 min  Charges:  $Therapeutic Exercise: 8-22 mins $Therapeutic Activity: 23-37 mins                    G CodesIrwin Brakeman F 26-Jun-2016, 11:58 AM [facility] [phone] (pager)

## 2016-06-11 ENCOUNTER — Inpatient Hospital Stay (HOSPITAL_COMMUNITY): Payer: PRIVATE HEALTH INSURANCE | Admitting: Certified Registered Nurse Anesthetist

## 2016-06-11 ENCOUNTER — Encounter (HOSPITAL_COMMUNITY)
Admission: EM | Disposition: A | Discharge status: Rehabilitation Facility | Payer: Self-pay | Source: Home / Self Care | Attending: Internal Medicine

## 2016-06-11 HISTORY — PX: TRACHEOSTOMY TUBE PLACEMENT: SHX814

## 2016-06-11 LAB — CBC
HCT: 48.9 % — ABNORMAL HIGH (ref 36.0–46.0)
Hemoglobin: 14.1 g/dL (ref 12.0–15.0)
MCH: 24.7 pg — ABNORMAL LOW (ref 26.0–34.0)
MCHC: 28.8 g/dL — ABNORMAL LOW (ref 30.0–36.0)
MCV: 85.5 fL (ref 78.0–100.0)
Platelets: 170 10*3/uL (ref 150–400)
RBC: 5.72 MIL/uL — ABNORMAL HIGH (ref 3.87–5.11)
RDW: 22.6 % — ABNORMAL HIGH (ref 11.5–15.5)
WBC: 7.5 10*3/uL (ref 4.0–10.5)

## 2016-06-11 LAB — GLUCOSE, CAPILLARY
Glucose-Capillary: 114 mg/dL — ABNORMAL HIGH (ref 65–99)
Glucose-Capillary: 157 mg/dL — ABNORMAL HIGH (ref 65–99)
Glucose-Capillary: 125 mg/dL — ABNORMAL HIGH (ref 65–99)
Glucose-Capillary: 129 mg/dL — ABNORMAL HIGH (ref 65–99)
Glucose-Capillary: 114 mg/dL — ABNORMAL HIGH (ref 65–99)
Glucose-Capillary: 98 mg/dL (ref 65–99)

## 2016-06-11 LAB — BASIC METABOLIC PANEL
Anion gap: 9 (ref 5–15)
BUN: 72 mg/dL — ABNORMAL HIGH (ref 6–20)
CO2: 35 mmol/L — ABNORMAL HIGH (ref 22–32)
Calcium: 9.6 mg/dL (ref 8.9–10.3)
Chloride: 113 mmol/L — ABNORMAL HIGH (ref 101–111)
Creatinine, Ser: 1.12 mg/dL — ABNORMAL HIGH (ref 0.44–1.00)
GFR calc Af Amer: >60 mL/min (ref >60)
GFR calc non Af Amer: >60 mL/min (ref >60)
Glucose, Bld: 130 mg/dL — ABNORMAL HIGH (ref 65–99)
Potassium: 3.7 mmol/L (ref 3.5–5.1)
Sodium: 157 mmol/L — ABNORMAL HIGH (ref 135–145)

## 2016-06-11 LAB — PHOSPHORUS: Phosphorus: 4.5 mg/dL (ref 2.5–4.6)

## 2016-06-11 LAB — MAGNESIUM: Magnesium: 2.7 mg/dL — ABNORMAL HIGH (ref 1.7–2.4)

## 2016-06-11 LAB — PROCALCITONIN: Procalcitonin: 0.17 ng/mL

## 2016-06-11 SURGERY — CREATION, TRACHEOSTOMY
Anesthesia: General | Site: Neck

## 2016-06-11 MED ORDER — MIDAZOLAM HCL 5 MG/5ML IJ SOLN
INTRAMUSCULAR | Status: DC | PRN
  Administered 2016-06-11: 2 mg via INTRAVENOUS

## 2016-06-11 MED ORDER — ONDANSETRON HCL 4 MG/2ML IJ SOLN
INTRAMUSCULAR | Status: DC | PRN
  Administered 2016-06-11: 4 mg via INTRAVENOUS

## 2016-06-11 MED ORDER — MIDAZOLAM HCL 2 MG/2ML IJ SOLN
INTRAMUSCULAR | Status: AC
  Filled 2016-06-11: qty 2

## 2016-06-11 MED ORDER — METOPROLOL TARTRATE 5 MG/5ML IV SOLN
5.0000 mg | Freq: Once | INTRAVENOUS | Status: AC
  Administered 2016-06-11: 5 mg via INTRAVENOUS

## 2016-06-11 MED ORDER — ROCURONIUM BROMIDE 100 MG/10ML IV SOLN
INTRAVENOUS | Status: DC | PRN
  Administered 2016-06-11: 40 mg via INTRAVENOUS

## 2016-06-11 MED ORDER — SODIUM CHLORIDE 0.9 % IV SOLN
1.0000 mg/h | INTRAVENOUS | Status: DC
  Administered 2016-06-11: 1 mg/h via INTRAVENOUS
  Administered 2016-06-12: 2 mg/h via INTRAVENOUS
  Filled 2016-06-11 (×2): qty 10

## 2016-06-11 MED ORDER — FREE WATER
200.0000 mL | Status: DC
  Administered 2016-06-12 – 2016-06-14 (×11): 200 mL

## 2016-06-11 MED ORDER — SODIUM CHLORIDE 0.9 % IV SOLN
INTRAVENOUS | Status: DC | PRN
  Administered 2016-06-11: 12:00:00 via INTRAVENOUS

## 2016-06-11 MED ORDER — LIDOCAINE-EPINEPHRINE 1 %-1:100000 IJ SOLN
INTRAMUSCULAR | Status: AC
  Filled 2016-06-11: qty 1

## 2016-06-11 MED ORDER — LIDOCAINE-EPINEPHRINE 1 %-1:100000 IJ SOLN
INTRAMUSCULAR | Status: DC | PRN
  Administered 2016-06-11: 10 mL

## 2016-06-11 MED ORDER — FENTANYL CITRATE (PF) 100 MCG/2ML IJ SOLN
INTRAMUSCULAR | Status: DC | PRN
  Administered 2016-06-11: 100 ug via INTRAVENOUS

## 2016-06-11 MED ORDER — FENTANYL CITRATE (PF) 100 MCG/2ML IJ SOLN
INTRAMUSCULAR | Status: AC
  Filled 2016-06-11: qty 2

## 2016-06-11 MED ORDER — PROPOFOL 10 MG/ML IV BOLUS
INTRAVENOUS | Status: DC | PRN
  Administered 2016-06-11: 80 mg via INTRAVENOUS

## 2016-06-11 MED ORDER — 0.9 % SODIUM CHLORIDE (POUR BTL) OPTIME
TOPICAL | Status: DC | PRN
  Administered 2016-06-11: 1000 mL

## 2016-06-11 MED ORDER — METOPROLOL TARTRATE 5 MG/5ML IV SOLN
INTRAVENOUS | Status: AC
  Administered 2016-06-11: 11:00:00
  Filled 2016-06-11: qty 5

## 2016-06-11 SURGICAL SUPPLY — 40 items
BLADE SURG 15 STRL LF DISP TIS (BLADE) IMPLANT
BLADE SURG 15 STRL SS (BLADE)
BLADE SURG ROTATE 9660 (MISCELLANEOUS) IMPLANT
CANISTER SUCTION 2500CC (MISCELLANEOUS) ×2 IMPLANT
CLEANER TIP ELECTROSURG 2X2 (MISCELLANEOUS) ×2 IMPLANT
COVER SURGICAL LIGHT HANDLE (MISCELLANEOUS) ×2 IMPLANT
CRADLE DONUT ADULT HEAD (MISCELLANEOUS) IMPLANT
DECANTER SPIKE VIAL GLASS SM (MISCELLANEOUS) ×2 IMPLANT
DRAPE PROXIMA HALF (DRAPES) IMPLANT
ELECT COATED BLADE 2.86 ST (ELECTRODE) ×2 IMPLANT
ELECT REM PT RETURN 9FT ADLT (ELECTROSURGICAL) ×2
ELECTRODE REM PT RTRN 9FT ADLT (ELECTROSURGICAL) ×1 IMPLANT
GAUZE SPONGE 4X4 16PLY XRAY LF (GAUZE/BANDAGES/DRESSINGS) ×2 IMPLANT
GLOVE BIO SURGEON STRL SZ7.5 (GLOVE) ×2 IMPLANT
GOWN STRL REUS W/ TWL LRG LVL3 (GOWN DISPOSABLE) ×2 IMPLANT
GOWN STRL REUS W/TWL LRG LVL3 (GOWN DISPOSABLE) ×2
HOLDER TRACH TUBE VELCRO 19.5 (MISCELLANEOUS) ×2 IMPLANT
HOVERMATT SINGLE USE (MISCELLANEOUS) ×2 IMPLANT
KIT BASIN OR (CUSTOM PROCEDURE TRAY) ×2 IMPLANT
KIT ROOM TURNOVER OR (KITS) ×2 IMPLANT
KIT SUCTION CATH 14FR (SUCTIONS) IMPLANT
NEEDLE HYPO 25GX1X1/2 BEV (NEEDLE) IMPLANT
NS IRRIG 1000ML POUR BTL (IV SOLUTION) ×2 IMPLANT
PACK EENT II TURBAN DRAPE (CUSTOM PROCEDURE TRAY) ×2 IMPLANT
PAD ARMBOARD 7.5X6 YLW CONV (MISCELLANEOUS) ×4 IMPLANT
PENCIL BUTTON HOLSTER BLD 10FT (ELECTRODE) ×2 IMPLANT
SPONGE DRAIN TRACH 4X4 STRL 2S (GAUZE/BANDAGES/DRESSINGS) ×2 IMPLANT
SPONGE INTESTINAL PEANUT (DISPOSABLE) IMPLANT
SUT MON AB 3-0 SH 27 (SUTURE) ×1
SUT MON AB 3-0 SH27 (SUTURE) ×1 IMPLANT
SUT SILK 0 FSL (SUTURE) ×2 IMPLANT
SUT SILK 2 0 REEL (SUTURE) ×2 IMPLANT
SUT SILK 2 0 SH CR/8 (SUTURE) ×2 IMPLANT
SUT VIC AB 2-0 FS1 27 (SUTURE) IMPLANT
SYR 20ML ECCENTRIC (SYRINGE) ×2 IMPLANT
SYR BULB 3OZ (MISCELLANEOUS) ×2 IMPLANT
SYR CONTROL 10ML LL (SYRINGE) IMPLANT
TOWEL OR 17X24 6PK STRL BLUE (TOWEL DISPOSABLE) ×2 IMPLANT
TUBE CONNECTING 12X1/4 (SUCTIONS) ×2 IMPLANT
WATER STERILE IRR 1000ML POUR (IV SOLUTION) ×2 IMPLANT

## 2016-06-11 NOTE — Transfer of Care (Signed)
Immediate Anesthesia Transfer of Care Note  Patient: [name] [name]  Procedure(s) Performed: Procedure(s): TRACHEOSTOMY (N/A)  Patient Location: ICU  Anesthesia Type:General  Level of Consciousness: sedated  Airway & Oxygen Therapy: Patient remains intubated per anesthesia plan and Patient placed on Ventilator (see vital sign flow sheet for setting)  Post-op Assessment: Report given to RN and Post -op Vital signs reviewed and stable  Post vital signs: Reviewed and stable  Last Vitals:  Vitals:   06/11/16 1109 06/11/16 1134  BP: (!) 176/121 (!) 173/115  Pulse: 98 91  Resp: (!) 29 (!) 26  Temp:  36.9 C    Last Pain:  Vitals:   06/11/16 1134  TempSrc: Oral  PainSc:          Complications: No apparent anesthesia complications

## 2016-06-11 NOTE — Progress Notes (Addendum)
PULMONARY / CRITICAL CARE MEDICINE   Name: [name] [name] MRN: [id number] DOB: November 24, 1980    ADMISSION DATE:  05/25/2016 CONSULTATION DATE:  05/27/16  REFERRING MD:  Dr. Ree Kida - [facility]  CHIEF COMPLAINT:  Altered Mental Status   BRIEF: [name] with PMH of morbid obesity (469 lbs), DM, HTN and tachycardia who presented to [facility] on 10/3 with acute on chronic respiratory failure with hypercarbia and hypoxemia due to diastolic CHF exacerbation and obesity hypoventilation syndrome.  STUDIES:  ECHO 10/4 >> EF 55%, PAP 42  CULTURES: Trache 10/17 > Normal flora Blood 10/18 >  (-) Trache 10/19 >   ANTIBIOTICS: Vanc 10/18 > Zosyn 10/18 >   SIGNIFICANT EVENTS: 10/3  Admit 10/5  Transferred to ICU due to AMS, hypercarbic respiratory failure 05/28/16:  RN reports pt remains on bipap, ntg gtt continues & BP remains elevated in 856'D systolic.  Net negative 5.5 L in last 24 hours with improvement in sr cr  05/29/16 - intubated overnight .  10/7 > 10/14 diuresed aggressively 10/14 - attempting wean 10/15 weaning   SUBJECTIVE First time this week I have seen this pt awake and following commands.  Doing PST > tachypneic.  Increased ET purulent secretions.  Febrile.  VITAL SIGNS: BP (!) 148/98   Pulse (!) 109   Temp 98.4 F (36.9 C) (Axillary)   Resp (!) 23   Ht _0  (1.6 m)   Wt (!) 142.7 kg (314 lb 8 oz)   LMP  (LMP Unknown)   SpO2 93%   BMI 55.71 kg/m   HEMODYNAMICS: CVP:  [8 mmHg-13 mmHg] 8 mmHg  VENTILATOR SETTINGS: Vent Mode: PRVC FiO2 (%):  [40 %] 40 % Set Rate:  [25 bmp] 25 bmp Vt Set:  [420 mL] 420 mL PEEP:  [5 cmH20] 5 cmH20 Pressure Support:  [5 cmH20] 5 cmH20 Plateau Pressure:  [18 cmH20] 18 cmH20  INTAKE / OUTPUT: I/O last 3 completed shifts: In: 3307.5 [I.V.:87.5; Other:190; NG/GT:1880; IV [id number]:8588] Out: 5027 [Urine:3805]  PHYSICAL EXAMINATION: General: awake. Follows commands.  (+) erythema/blood clot in R eye 2/2 coughing hard last week.    HENT: NCAT ETT in place. Significanlty less tongue swelling.  PULM:  Dec BS bases. Distant BS 2/2 obesity. Crackles mid-bases posteriorly.  CV: RRR, no mgr GI: belly soft, nontender Derm: Gr 1` leg edema Neuro: sedated. Not following commands. Eye opening.   LABS: PULMONARY  Recent Labs Lab 06/05/16 0350 06/08/16 1030  PHART 7.341* 7.306*  PCO2ART 82.8* 86.1*  PO2ART 63.2* 80.3*  HCO3 43.6* 41.7*  O2SAT 88.5 93.7   CBC  Recent Labs Lab 06/09/16 0458 06/10/16 0500 06/11/16 0500  HGB 13.2 13.7 14.1  HCT 45.4 47.9* 48.9*  WBC 5.5 6.8 7.5  PLT 220 230 170   COAGULATION No results for input(s): INR in the last 168 hours. CARDIAC   No results for input(s): TROPONINI in the last 168 hours. No results for input(s): PROBNP in the last 168 hours.  CHEMISTRY  Recent Labs Lab 06/05/16 0842 06/06/16 0500 06/07/16 0454 06/08/16 0342 06/09/16 0458 06/10/16 0500 06/11/16 0500  NA 151* 150* 153* 151* 149* 153* 157*  K 3.5 3.8 3.8 4.3 4.4 4.2 3.7  CL 101 101 104 102 100* 109 113*  CO2 42* 39* 41* 39* 38* 36* 35*  GLUCOSE 126* 114* 145* 172* 190* 135* 130*  BUN 61* 68* 69* 65* 83* 90* 72*  CREATININE 1.26* 1.14* 1.06* 1.09* 1.30* 1.24* 1.12*  CALCIUM 9.6 9.7 9.9 9.9 10.2  9.8 9.6  MG 2.5* 2.6*  --  2.7* 2.8* 2.6* 2.7*  PHOS 4.0  --   --  3.3 5.2* 4.3 4.5   Estimated Creatinine Clearance: 97 mL/min (by C-G formula based on SCr of 1.12 mg/dL (H)).  LIVER No results for input(s): AST, ALT, ALKPHOS, BILITOT, PROT, ALBUMIN, INR in the last 168 hours. INFECTIOUS  Recent Labs Lab 06/09/16 0911 06/10/16 0500 06/11/16 0500  PROCALCITON 0.10 <0.10 0.17   ENDOCRINE CBG (last 3)   Recent Labs  06/10/16 2318 06/11/16 0406 06/11/16 0747  GLUCAP 139* 114* 157*     DISCUSSION: [name] with PMH of super morbid obesity, HTN, pulmonary hypertension admitted with decompensated diastolic CHF.     PULMONARY A: Acute on Chronic Hypoxemic and Hypercarbic Respiratory  Failure Concern for HCAP as pt has been on the vent x 13 days and she is failing PST despite diuresis. CXR is always suboptimal 2/2 body habitus. Now with persistent fever and increased purulent secretions per ET last 24-48 hrs.  Secondary pulmonary hypertension Severe OSA / OHS with acute Decompensation - sleep study in 2012 with up to 75 events per hour with desaturation to 72% Tongue swelling since 10/13 compromising airway. Swelling is better.   P:   Plan for tracheostomy per ENT today. ATC trials as tolerated. Pt has been on the vent x 13 days and I do not think we can extubate her safely in the next 2-3 days. Concern for HCAP as well. Hopefully, if she improves significantly, she can be decannulated this admission. Trache will also rx OSA. Not sure re: CPAP compliance.  Cont abx for now. Observe fever trend post trache. If better and cultures are (-), can deescalate abx.  Hold off on diuresis. Still (-).  VAP prevention O2 as needed to support sats > 88% Off steroids and H2 and H1 blocker for tongue swelling on 10/19 > observe tongue for now.   CARDIOVASCULAR A:  Hypertensive Urgency > resolved Pulm edema/Volume overload > lost 47 kgs at her peak Acute diastolic heart failure Demand ischemia Hx HTN Long pauses on telemetry in evenings P:  Hydralazine 75 mg TID  B-blocker per cardiology Amlodipine per cardiology Goal SBP < 150 per RN Tele Off diuretics since 10/18, still (-).   RENAL A:   AKI / Azotemia 2/2 diuresis Hypernatremia Hypokalemia P:   Monitor BMET and UOP Replace electrolytes as needed Off diuretics since 10/18 Cont free water with TF > will increase today as Na is higher Keep euvolemic  GASTROINTESTINAL A:   Morbid obesity Nutrition P:   TF per nutrition  Outpatient obesity clinic referral  HEMATOLOGIC A:   VTE prophylaxis P:  SCD's / Lovenox for DVT prophylaxis CBC daily  INFECTIOUS A:   Possible hcap. Persistent fevers with inc  purulent ET secretions.  P:   cont vanc/zosyn F/u cultures PCT is N. Observe fever pattern post trache. If better, deescalate or stop abx if cultures remain (-).    ENDOCRINE A:   No acute issues P:   No interventions required  NEUROLOGIC A:   Acute encephalopathy - in the setting of hypercarbia.  On and off AMS, sometimes she follows commands, sometimes she does not > meds related,  not sure if related to psych issues (pt with depression)  Depression - family reports pt is in "denial" - seen by Psych 05/28/16  P:   Sedation> titrated to RASS -1 Fentanyl gtt/versed prn    FAMILY  - Updates: Family  updated at length on 10/14 and 10/15. Mother updated at bedside on 10/20 and she agrees to trache.   - Inter-disciplinary family meet or Palliative Care meeting due by: 10/12  Critical Care time with this patient today : 30 minutes.    Monica Becton, MD 06/11/2016, 9:48 AM [place] [facility] Pager [phone] After 3 pm or if no answer, call [phone]

## 2016-06-11 NOTE — Progress Notes (Signed)
[name] alert this am, pt updated to time, place, locatio and cituation, pt and mom tearful at [facility], pt nodded yes and no to questions, pt updated on trach today and became very tearful again, pt trached today, pt coughing with small bloody secretions in tube, pt sedation increased for toleration of new trach, pt RASS score 0 at present, nursing will cont to monitor

## 2016-06-11 NOTE — Progress Notes (Signed)
Pt taken off vent for transport to OR by CRNA using an ambu bag.

## 2016-06-11 NOTE — Progress Notes (Signed)
Pt returned from OR and placed back on original vent settings including 40% FIO2. Pt desat to 86%.  Several O2 breaths given without improvement, RT used ambu bag on pt, sat recovered to 95%. Pt placed back on vent and sat dropped to 87%. FIO2 increased on vent to 100%, Pt suctioned. RN notified.

## 2016-06-11 NOTE — Anesthesia Preprocedure Evaluation (Addendum)
Anesthesia Evaluation  Patient identified by MRN, date of birth, ID band  Reviewed: Allergy & Precautions, NPO status , Patient's Chart, lab work & pertinent test results, reviewed documented beta blocker date and time , Unable to perform ROS - Chart review only  Airway Mallampati: Intubated       Dental   Pulmonary sleep apnea , pneumonia (HCAP),  Acute respiratory failure   Pulmonary exam normal breath sounds clear to auscultation       Cardiovascular hypertension, Pt. on medications +CHF (acute diastolic CHF)  + dysrhythmias (sinus tachycardia)  Rhythm:Regular Rate:Normal  CT scan chest: No PE, but dilated pulmonary artery (30m).  ECHO 05/26/2016: - Left ventricle: The cavity size was normal. Systolic function was normal. The estimated ejection fraction was in the range of 50% to 55%. Regional wall motion abnormalities cannot be excluded. The study is not technically sufficient to allow evaluation of LV diastolic function. - Left atrium: The atrium was mildly dilated. - Right atrium: The atrium was mildly dilated. - Pulmonary arteries: Systolic pressure was mildly increased. PA peak pressure: 42 mm Hg (S).  Impressions:  - Extremely limited; definity used; probable low normal LV systolicfunction; mild biatrial enlargement; mild TR; mildly elevated pulmonary pressure; suggest MUGA or cardiac MRI if clinically indicated.   Neuro/Psych    GI/Hepatic Hepatic congestion   Endo/Other  diabetesMorbid obesity  Renal/GU ARFRenal disease     Musculoskeletal   Abdominal (+) + obese,   Peds  Hematology   Anesthesia Other Findings   Reproductive/Obstetrics                            Anesthesia Physical Anesthesia Plan  ASA: IV  Anesthesia Plan: General   Post-op Pain Management:    Induction: Intravenous  Airway Management Planned: Oral ETT and Tracheostomy  Additional  Equipment:   Intra-op Plan:   Post-operative Plan:   Informed Consent: I have reviewed the patients History and Physical, chart, labs and discussed the procedure including the risks, benefits and alternatives for the proposed anesthesia with the patient or authorized representative who has indicated his/her understanding and acceptance.     Plan Discussed with: CRNA  Anesthesia Plan Comments:        Anesthesia Quick Evaluation

## 2016-06-11 NOTE — Brief Op Note (Signed)
05/25/2016 - 06/11/2016  12:46 PM  PATIENT:  [name] [name]  35 y.o. female  PRE-OPERATIVE DIAGNOSIS:  RESPIRATORY FAILURE, SLEEP APNEA  POST-OPERATIVE DIAGNOSIS:  RESPIRATORY FAILURE, SLEEP APNEA  PROCEDURE:  Procedure(s): TRACHEOSTOMY (N/A)  SURGEON:  Surgeon(s) and Role:    * Melida Quitter, MD - Primary  PHYSICIAN ASSISTANT:   ASSISTANTS: none   ANESTHESIA:   general  EBL:  Total I/O In: 307.5 [I.V.:7.5; IV Piggyback:300] Out: 425 [Urine:425]  BLOOD ADMINISTERED:none  DRAINS: none   LOCAL MEDICATIONS USED:  LIDOCAINE   SPECIMEN:  No Specimen  DISPOSITION OF SPECIMEN:  N/A  COUNTS:  YES  TOURNIQUET:  * No tourniquets in log *  DICTATION: .Other Dictation: Dictation Number [id number]  PLAN OF CARE: Return to ICU  PATIENT DISPOSITION:  ICU - intubated and hemodynamically stable.   Delay start of Pharmacological VTE agent (>24hrs) due to surgical blood loss or risk of bleeding: no

## 2016-06-11 NOTE — Progress Notes (Signed)
Patient Name: [name] [name] Date of Encounter: 06/11/2016  Primary Cardiologist: Reola Calkins [facility] Problem List     Principal Problem:   Acute congestive heart failure [facility]) Active Problems:   Malignant hypertension   Morbid obesity (HCC)   OSA (obstructive sleep apnea)   Renal insufficiency   Acute pulmonary edema (HCC)   Abdominal pain   Hepatic congestion   Sinus tachycardia   Pericardial effusion   Pulmonary hypertension   Acute respiratory failure with hypoxemia (HCC)   Difficult intravenous access   Sinus pause   HCAP (healthcare-associated pneumonia)     Subjective   Remains intubated however she is more alert today, able to move all extremities. Following commands.  Inpatient Medications    Scheduled Meds: . amLODipine  10 mg Per Tube Daily  . chlorhexidine gluconate (MEDLINE KIT)  15 mL Mouth Rinse BID  . docusate  100 mg Per Tube BID  . enoxaparin (LOVENOX) injection  70 mg Subcutaneous Q24H  . famotidine (PEPCID) IV  20 mg Intravenous Q12H  . feeding supplement (PRO-STAT SUGAR FREE 64)  60 mL Per Tube TID  . feeding supplement (VITAL HIGH PROTEIN)  1,000 mL Per Tube Q24H  . free water  200 mL Per Tube Q4H  . hydrALAZINE  75 mg Oral Q8H  . insulin aspart  2-6 Units Subcutaneous Q4H  . isosorbide dinitrate  30 mg Oral BID  . magnesium hydroxide  30 mL Per Tube Daily  . mouth rinse  15 mL Mouth Rinse 10 times per day  . piperacillin-tazobactam (ZOSYN)  IV  3.375 g Intravenous Q8H  . polyethylene glycol  17 g Oral Daily  . potassium chloride  40 mEq Per Tube BID  . sodium chloride flush  10-40 mL Intracatheter Q12H  . sodium chloride flush  3 mL Intravenous Q12H  . THROMBI-PAD  1 each Topical Once  . vancomycin  1,250 mg Intravenous Q12H   Continuous Infusions: . fentaNYL infusion INTRAVENOUS 25 mcg/hr (06/09/16 2000)   PRN Meds:.acetaminophen (TYLENOL) oral liquid 160 mg/5 mL, fentaNYL (SUBLIMAZE) injection, hydrALAZINE, midazolam, ondansetron  **OR** ondansetron (ZOFRAN) IV, sodium chloride flush   Vital Signs    Vitals:   06/11/16 0750 06/11/16 0753 06/11/16 0800 06/11/16 0900  BP: (!) 153/99  (!) 163/99 (!) 148/98  Pulse: (!) 115  (!) 109 (!) 109  Resp: 18  (!) 22 (!) 23  Temp: 98.8 F (37.1 C) 98.4 F (36.9 C)    TempSrc: Oral Axillary    SpO2: 94%  95% 93%  Weight:      Height:        Intake/Output Summary (Last 24 hours) at 06/11/16 1017 Last data filed at 06/11/16 0800  Gross per 24 hour  Intake             2045 ml  Output             2730 ml  Net             -685 ml   Filed Weights   06/07/16 0442 06/08/16 0455 06/09/16 0400  Weight: (!) 338 lb (153.3 kg) (!) 332 lb 8 oz (150.8 kg) (!) 314 lb 8 oz (142.7 kg)    Physical Exam    GEN: Sedate on vent HEENT: Grossly normal. ETT.Right eye with scleral hematoma Neck: Supple, no JVD, carotid bruits, or masses. Cardiac: Regular rhythm, tachycardic, no murmurs, rubs, or gallops. No clubbing, cyanosis, chronic appearing edema.  Radials/DP/PT 2+ and equal bilaterally.  Respiratory: vent noise B GI: Soft, nontender, nondistended, BS + x 4. obese MS: no deformity or atrophy. Skin: warm and dry, no rash. Neuro:  More alert today, following commands, moving extremities Psyc: More alert  Labs    CBC  Recent Labs  06/10/16 0500 06/11/16 0500  WBC 6.8 7.5  HGB 13.7 14.1  HCT 47.9* 48.9*  MCV 83.9 85.5  PLT 230 258   Basic Metabolic Panel  Recent Labs  06/10/16 0500 06/11/16 0500  NA 153* 157*  K 4.2 3.7  CL 109 113*  CO2 36* 35*  GLUCOSE 135* 130*  BUN 90* 72*  CREATININE 1.24* 1.12*  CALCIUM 9.8 9.6  MG 2.6* 2.7*  PHOS 4.3 4.5    Telemetry    Has had up to 11 second pauses with cough, strain (vagal/OSA, reviewed with Dr. Rayann Heman), now NSR, ST - Personally Reviewed  ECG    NSR, PRWP- Personally Reviewed  Radiology    No results found.   Cardiac Studies   CT scan chest: No PE, but dilated pulmonary artery (51m).  ECHO: - Left  ventricle: The cavity size was normal. Systolic function was   normal. The estimated ejection fraction was in the range of 50%   to 55%. Regional wall motion abnormalities cannot be excluded.   The study is not technically sufficient to allow evaluation of LV   diastolic function. - Left atrium: The atrium was mildly dilated. - Right atrium: The atrium was mildly dilated. - Pulmonary arteries: Systolic pressure was mildly increased. PA   peak pressure: 42 mm Hg (S).  Impressions:  - Extremely limited; definity used; probable low normal LV sstolci   function; mild biatrial enlargement; mild TR; mildly elevated   pulmonary pressure; suggest MUGA or cardiac MRI if clinically   indicated.  Patient Profile     35year old with super morbid obesity, secondary pulmonary hypertension, acute diastolic heart failure, OSA/Obesity hypoventilation, hypercarbic respiratory failure.  Assessment & Plan    Acute diastolic HF  - ECHO with normal EF, mild pulm HTN.   - Stopped lasix 10/18, BUN and creat elevated. BUN today is slightly up, creatinine stable. Continue to hold Lasix as well as Diamox  - CVP 8  - Discussed with CCM team  Obesity  - Secondary pulm htn (dilated pulm art)  - weight loss  - Trach discussed, Dr. BRedmond Basemannote reviewed  Secondary pulm htn  - as above  - weight loss  - BP control  - small pericardial effusion should be of no clinical concern.   Elevated troponin/ demand ischemia  - low level troponin secondary to acute heart strain/failure.   Essential hypertension  - BP elevated again despite aggressive diuresis, see meds.   Sinus Pause   - 11 second at one point - Vagal/OSA. No pacer indicated, discussed with Dr. ARayann Heman EP.   - stopped metoprolol 10/14  - we are seeing some sinus tachycardia. Will tolerate.   AKI  - Continue to hold lasix and diamox for now. Improved creat.   Encouraging mental status currently.  Signed, MCandee Furbish MD  06/11/2016,  10:17 AM

## 2016-06-12 ENCOUNTER — Encounter (HOSPITAL_COMMUNITY): Payer: Self-pay | Admitting: Otolaryngology

## 2016-06-12 ENCOUNTER — Inpatient Hospital Stay (HOSPITAL_COMMUNITY): Payer: PRIVATE HEALTH INSURANCE

## 2016-06-12 DIAGNOSIS — E876 Hypokalemia: Secondary | ICD-10-CM

## 2016-06-12 DIAGNOSIS — J9620 Acute and chronic respiratory failure, unspecified whether with hypoxia or hypercapnia: Secondary | ICD-10-CM

## 2016-06-12 DIAGNOSIS — J9601 Acute respiratory failure with hypoxia: Secondary | ICD-10-CM

## 2016-06-12 LAB — GLUCOSE, CAPILLARY
Glucose-Capillary: 137 mg/dL — ABNORMAL HIGH (ref 65–99)
Glucose-Capillary: 122 mg/dL — ABNORMAL HIGH (ref 65–99)
Glucose-Capillary: 129 mg/dL — ABNORMAL HIGH (ref 65–99)
Glucose-Capillary: 112 mg/dL — ABNORMAL HIGH (ref 65–99)
Glucose-Capillary: 111 mg/dL — ABNORMAL HIGH (ref 65–99)
Glucose-Capillary: 104 mg/dL — ABNORMAL HIGH (ref 65–99)

## 2016-06-12 LAB — CULTURE, RESPIRATORY W GRAM STAIN: Culture: NORMAL

## 2016-06-12 LAB — BASIC METABOLIC PANEL
Anion gap: 8 (ref 5–15)
BUN: 50 mg/dL — ABNORMAL HIGH (ref 6–20)
CO2: 32 mmol/L (ref 22–32)
Calcium: 10 mg/dL (ref 8.9–10.3)
Chloride: 117 mmol/L — ABNORMAL HIGH (ref 101–111)
Creatinine, Ser: 0.93 mg/dL (ref 0.44–1.00)
GFR calc Af Amer: >60 mL/min (ref >60)
GFR calc non Af Amer: >60 mL/min (ref >60)
Glucose, Bld: 125 mg/dL — ABNORMAL HIGH (ref 65–99)
Potassium: 3.2 mmol/L — ABNORMAL LOW (ref 3.5–5.1)
Sodium: 157 mmol/L — ABNORMAL HIGH (ref 135–145)

## 2016-06-12 LAB — CBC
HCT: 51.3 % — ABNORMAL HIGH (ref 36.0–46.0)
Hemoglobin: 14.7 g/dL (ref 12.0–15.0)
MCH: 24.4 pg — ABNORMAL LOW (ref 26.0–34.0)
MCHC: 28.7 g/dL — ABNORMAL LOW (ref 30.0–36.0)
MCV: 85.1 fL (ref 78.0–100.0)
Platelets: 159 10*3/uL (ref 150–400)
RBC: 6.03 MIL/uL — ABNORMAL HIGH (ref 3.87–5.11)
RDW: 22.8 % — ABNORMAL HIGH (ref 11.5–15.5)
WBC: 7.6 10*3/uL (ref 4.0–10.5)

## 2016-06-12 LAB — PHOSPHORUS: Phosphorus: 2.6 mg/dL (ref 2.5–4.6)

## 2016-06-12 LAB — MAGNESIUM: Magnesium: 2.4 mg/dL (ref 1.7–2.4)

## 2016-06-12 MED ORDER — POTASSIUM CHLORIDE 10 MEQ/100ML IV SOLN
10.0000 meq | INTRAVENOUS | Status: AC
Start: 2016-06-12 — End: 2016-06-12
  Administered 2016-06-12 (×2): 10 meq via INTRAVENOUS
  Filled 2016-06-12 (×2): qty 100

## 2016-06-12 MED ORDER — WHITE PETROLATUM GEL
Status: AC
  Administered 2016-06-12: 1
  Filled 2016-06-12: qty 1

## 2016-06-12 MED ORDER — ORAL CARE MOUTH RINSE
15.0000 mL | OROMUCOSAL | Status: DC
  Administered 2016-06-12 – 2016-06-28 (×46): 15 mL via OROMUCOSAL

## 2016-06-12 NOTE — Progress Notes (Signed)
Patient Name: [name] [name] Date of Encounter: 06/12/2016  Primary Cardiologist: [facility] Problem List     Principal Problem:   Acute congestive heart failure [facility]) Active Problems:   Malignant hypertension   Morbid obesity (HCC)   OSA (obstructive sleep apnea)   Renal insufficiency   Acute pulmonary edema (HCC)   Abdominal pain   Hepatic congestion   Sinus tachycardia   Pericardial effusion   Pulmonary hypertension   Acute respiratory failure with hypoxemia (HCC)   Difficult intravenous access   Sinus pause   HCAP (healthcare-associated pneumonia)     Subjective   Breathing comfortably.   Inpatient Medications    Scheduled Meds: . amLODipine  10 mg Per Tube Daily  . chlorhexidine gluconate (MEDLINE KIT)  15 mL Mouth Rinse BID  . docusate  100 mg Per Tube BID  . enoxaparin (LOVENOX) injection  70 mg Subcutaneous Q24H  . famotidine (PEPCID) IV  20 mg Intravenous Q12H  . feeding supplement (PRO-STAT SUGAR FREE 64)  60 mL Per Tube TID  . feeding supplement (VITAL HIGH PROTEIN)  1,000 mL Per Tube Q24H  . free water  200 mL Per Tube Q4H  . hydrALAZINE  75 mg Oral Q8H  . insulin aspart  2-6 Units Subcutaneous Q4H  . isosorbide dinitrate  30 mg Oral BID  . magnesium hydroxide  30 mL Per Tube Daily  . mouth rinse  15 mL Mouth Rinse 10 times per day  . piperacillin-tazobactam (ZOSYN)  IV  3.375 g Intravenous Q8H  . polyethylene glycol  17 g Oral Daily  . potassium chloride  40 mEq Per Tube BID  . sodium chloride flush  10-40 mL Intracatheter Q12H  . sodium chloride flush  3 mL Intravenous Q12H  . THROMBI-PAD  1 each Topical Once  . vancomycin  1,250 mg Intravenous Q12H   Continuous Infusions: . fentaNYL infusion INTRAVENOUS 50 mcg/hr (06/12/16 0849)  . midazolam (VERSED) infusion Stopped (06/12/16 0850)   PRN Meds: acetaminophen (TYLENOL) oral liquid 160 mg/5 mL, fentaNYL (SUBLIMAZE) injection, hydrALAZINE, midazolam, ondansetron **OR** ondansetron  (ZOFRAN) IV, sodium chloride flush   Vital Signs    Vitals:   06/12/16 0600 06/12/16 0700 06/12/16 0739 06/12/16 0800  BP: (!) 160/99 (!) 150/88 (!) 150/88 (!) 143/88  Pulse: 87 99 (!) 106 (!) 102  Resp: (!) 25 (!) 26 14 (!) 25  Temp:  98.6 F (37 C)    TempSrc:  Oral    SpO2: 91% 92% 92% 92%  Weight:      Height:        Intake/Output Summary (Last 24 hours) at 06/12/16 0948 Last data filed at 06/12/16 0900  Gross per 24 hour  Intake          1105.47 ml  Output             1595 ml  Net          -489.53 ml   Filed Weights   06/07/16 0442 06/08/16 0455 06/09/16 0400  Weight: (!) 153.3 kg (338 lb) (!) 150.8 kg (332 lb 8 oz) (!) 142.7 kg (314 lb 8 oz)    Physical Exam   GEN: Well nourished, well developed, in no acute distress.  HEENT: Grossly normal.  Neck: Supple, no JVD, carotid bruits, or masses. Cardiac: RRR, no murmurs, rubs, or gallops. No clubbing, cyanosis, edema.  Radials/DP/PT 2+ and equal bilaterally.  Respiratory:  Respirations regular and unlabored, clear to auscultation bilaterally. GI: Soft, nontender, nondistended, BS +  x 4. MS: no deformity or atrophy. Skin: warm and dry, no rash. Neuro:  Strength and sensation are intact. Psych: AAOx3.  Normal affect.  Labs    CBC  Recent Labs  06/11/16 0500 06/12/16 0501  WBC 7.5 7.6  HGB 14.1 14.7  HCT 48.9* 51.3*  MCV 85.5 85.1  PLT 170 122   Basic Metabolic Panel  Recent Labs  06/11/16 0500 06/12/16 0501  NA 157* 157*  K 3.7 3.2*  CL 113* 117*  CO2 35* 32  GLUCOSE 130* 125*  BUN 72* 50*  CREATININE 1.12* 0.93  CALCIUM 9.6 10.0  MG 2.7* 2.4  PHOS 4.5 2.6   Liver Function Tests No results for input(s): AST, ALT, ALKPHOS, BILITOT, PROT, ALBUMIN in the last 72 hours. No results for input(s): LIPASE, AMYLASE in the last 72 hours. Cardiac Enzymes No results for input(s): CKTOTAL, CKMB, CKMBINDEX, TROPONINI in the last 72 hours. BNP Invalid input(s): POCBNP D-Dimer No results for input(s):  DDIMER in the last 72 hours. Hemoglobin A1C No results for input(s): HGBA1C in the last 72 hours. Fasting Lipid Panel No results for input(s): CHOL, HDL, LDLCALC, TRIG, CHOLHDL, LDLDIRECT in the last 72 hours. Thyroid Function Tests No results for input(s): TSH, T4TOTAL, T3FREE, THYROIDAB in the last 72 hours.  Invalid input(s): FREET3  Telemetry    Sinus tach - Personally Reviewed  ECG    None recent  Radiology    No results found.  Cardiac Studies   EF 50-55%  Patient Profile     35 y/o with diastolic heart failure  Assessment & Plan    Acute diastolic HF  - ECHO with normal EF, mild pulm HTN.   - Stopped lasix 10/18, secondary to BUN and creat elevated. BUN / Cr back to normal. Holding Lasix as well as Diamox  - CVP 8 yesterday.  CVP  5 Mm Hg today.  Unable to get an accurate weight today as the bed was zeroed with the patient in the bed.  Continue to hold diuretics.   BP reasonably controlled today.  Hypokalemia: replace potassium 20 mEq IV. Signed, Larae Grooms, MD  06/12/2016, 9:48 AM

## 2016-06-12 NOTE — Progress Notes (Signed)
28m Versed gtt and 10104mFentanyl gtt wasted in sink with JeAllena KatzRN.

## 2016-06-12 NOTE — Progress Notes (Signed)
Occupational Therapy Treatment Patient Details Name: [name] [name] MRN: [id number] DOB: 04/16/1981 Today's Date: 06/12/2016    History of present illness [name] with PMH of morbid obesity (469 lbs), DM, HTN and tachycardia who presented to [facility] on 10/3 with reports of abdominal bloating and heavy abdominal pressure.  Intubated 05/29/16 to present, trached 06/10/16.   OT comments  Significant gains noted today in level of alertness, affect, direction following and UE active movement. Focus of session on B UE A/AA/PROM with pt able to perform L hand to face in supported sitting at end of session. Pt able to turn head from L to neutral and track therapist, separating eye from head movement, full range horizontally. Pt on full vent support with intermittent coughing when positioned upright in Vital Go tilt bed with HR elevating to 141, requiring rest break. Updated d/c plan to [facility].   Follow Up Recommendations  LTACH;Supervision/Assistance - 24 hour    Equipment Recommendations       Recommendations for Other Services      Precautions / Restrictions Precautions Precautions: Fall Restrictions Weight Bearing Restrictions: No       Mobility Bed Mobility               General bed mobility comments: mod to max assist of one to position shoulders/upper body in   Transfers                      Balance                                   ADL                                         General ADL Comments: Worked on hand to mouth with both UEs to push up glasses and scratch face. At end of session, able to perform L hand to nose with bed in sitting.      Vision                 Additional Comments: Pt tracking therapist full range. Continues to demonstrate limited neck ROM toward R, likely due to prolonge intubation, but can position head in neutral independently and lift head from surface of bed in semi reclined, sitting and  standing.   Perception     Praxis      Cognition   Behavior During Therapy: WFL for tasks assessed/performed Overall Cognitive Status: Impaired/Different from baseline Area of Impairment: Attention   Current Attention Level: Sustained    Following Commands: Follows one step commands with increased time     Problem Solving: Slow processing;Decreased initiation;Requires verbal cues;Requires tactile cues General Comments: Pt much more animated and indicating reliable yes/no. Continues to be distractible and have delayed response to commands/questions.    Extremity/Trunk Assessment               Exercises General Exercises - Upper Extremity Shoulder Flexion: Both;PROM;AAROM;5 reps;Seated Shoulder Horizontal ABduction: AAROM;Both;5 reps;Seated Shoulder Horizontal ADduction: AAROM;Both;5 reps;Seated Elbow Flexion: AAROM;Both;5 reps;Seated;Standing;AROM Elbow Extension: AAROM;AROM;Both;5 reps;Seated;Standing Digit Composite Flexion: AROM;Both;5 reps;Seated Composite Extension: PROM;Both;5 reps;Seated   Shoulder Instructions       General Comments      Pertinent Vitals/ Pain       Pain Assessment: Faces Faces Pain Scale: No hurt  Home Living                                          Prior Functioning/Environment              Frequency  Min 2X/week        Progress Toward Goals  OT Goals(current goals can now be found in the care plan section)  Progress towards OT goals: Progressing toward goals  Acute Rehab OT Goals Patient Stated Goal: unable to state OT Goal Formulation: Patient unable to participate in goal setting Time For Goal Achievement: 06/23/16 Potential to Achieve Goals: Good  Plan Frequency remains appropriate    Co-evaluation    PT/OT/SLP Co-Evaluation/Treatment: Yes Reason for Co-Treatment: For patient/therapist safety;Complexity of the patient's impairments (multi-system involvement)   OT goals addressed during  session: Strengthening/ROM;ADL's and self-care      End of Session Equipment Utilized During Treatment:  (full support of vent)   Activity Tolerance Treatment limited secondary to medical complications (Comment) (pt with increased coughing with HR to 141 and desaturations )   Patient Left in bed;with call bell/phone within reach (bed in chair position)   Nurse Communication Mobility status (activity tolerance, RN suctioned x 1)        Time: 8264-1583 OT Time Calculation (min): 38 min  Charges: OT General Charges $OT Visit: 1 Procedure OT Treatments $Therapeutic Activity: 8-22 mins  Malka So 06/12/2016, 3:56 PM  [phone]

## 2016-06-12 NOTE — Progress Notes (Signed)
PULMONARY / CRITICAL CARE MEDICINE   Name: [name] [name] MRN: [id number] DOB: 1981/03/20    ADMISSION DATE:  05/25/2016 CONSULTATION DATE:  05/27/16  REFERRING MD:  Dr. Ree Kida - [facility]  CHIEF COMPLAINT:  Altered Mental Status   BRIEF: [name] with PMH of morbid obesity (469 lbs), DM, HTN and tachycardia who presented to [facility] on 10/3 with acute on chronic respiratory failure with hypercarbia and hypoxemia due to diastolic CHF exacerbation and obesity hypoventilation syndrome.  Ultimately required tracheostomy due to prolonged respiratory failure. Diuresed over 100 lbs.   SUBJECTIVE:  No acute events overnight.  Pt more alert.  Afebrile.  Intermittent hypertension.    VITAL SIGNS: BP (!) 151/102   Pulse 98   Temp 98.1 F (36.7 C) (Oral)   Resp (!) 21   Ht _0  (1.6 m)   Wt (!) 314 lb 8 oz (142.7 kg)   LMP  (LMP Unknown)   SpO2 93%   BMI 55.71 kg/m   HEMODYNAMICS: CVP:  [5 mmHg-9 mmHg] 5 mmHg  VENTILATOR SETTINGS: Vent Mode: PRVC FiO2 (%):  [40 %-100 %] 40 % Set Rate:  [25 bmp] 25 bmp Vt Set:  [420 mL] 420 mL PEEP:  [5 cmH20] 5 cmH20 Plateau Pressure:  [22 cmH20-34 cmH20] 22 cmH20  INTAKE / OUTPUT: I/O last 3 completed shifts: In: 2025.7 [I.V.:655.7; Other:120; NG/GT:550; IV Piggyback:700] Out: 6767 [Urine:3150; Blood:25]  PHYSICAL EXAMINATION: General: awake. Follows commands. (+) erythema/blood clot in R eye 2/2 coughing hard    HENT: NCAT ETT in place. Significanlty less tongue swelling.  PULM:  Dec BS bases. Distant BS 2/2 obesity. Crackles mid-bases posteriorly.  CV: RRR, no mgr GI: belly soft, nontender Derm: Gr 1 leg edema Neuro: sedated. Not following commands. Eye opening.   LABS: PULMONARY  Recent Labs Lab 06/08/16 1030  PHART 7.306*  PCO2ART 86.1*  PO2ART 80.3*  HCO3 41.7*  O2SAT 93.7   CBC  Recent Labs Lab 06/10/16 0500 06/11/16 0500 06/12/16 0501  HGB 13.7 14.1 14.7  HCT 47.9* 48.9* 51.3*  WBC 6.8 7.5 7.6  PLT 230 170 159    COAGULATION No results for input(s): INR in the last 168 hours.   CARDIAC   No results for input(s): TROPONINI in the last 168 hours. No results for input(s): PROBNP in the last 168 hours.  CHEMISTRY  Recent Labs Lab 06/08/16 0342 06/09/16 0458 06/10/16 0500 06/11/16 0500 06/12/16 0501  NA 151* 149* 153* 157* 157*  K 4.3 4.4 4.2 3.7 3.2*  CL 102 100* 109 113* 117*  CO2 39* 38* 36* 35* 32  GLUCOSE 172* 190* 135* 130* 125*  BUN 65* 83* 90* 72* 50*  CREATININE 1.09* 1.30* 1.24* 1.12* 0.93  CALCIUM 9.9 10.2 9.8 9.6 10.0  MG 2.7* 2.8* 2.6* 2.7* 2.4  PHOS 3.3 5.2* 4.3 4.5 2.6   Estimated Creatinine Clearance: 116.8 mL/min (by C-G formula based on SCr of 0.93 mg/dL).  LIVER No results for input(s): AST, ALT, ALKPHOS, BILITOT, PROT, ALBUMIN, INR in the last 168 hours.   INFECTIOUS  Recent Labs Lab 06/09/16 0911 06/10/16 0500 06/11/16 0500  PROCALCITON 0.10 <0.10 0.17   ENDOCRINE CBG (last 3)   Recent Labs  06/12/16 0325 06/12/16 0737 06/12/16 1124  GLUCAP 137* 122* 129*    STUDIES:  ECHO 10/4 >> EF 55%, PAP 42  CULTURES: Trache 10/17 > Normal flora Blood 10/18 >  neg Trach 10/19 > normal flora   ANTIBIOTICS: Vanc 10/18 > Zosyn 10/18 >  SIGNIFICANT EVENTS: 10/03  Admit 10/05  Transferred to ICU due to AMS, hypercarbic respiratory failure 10/06  Bipap, ntg gtt & BP remains elevated in 161'W systolic.  Net neg 5.5 L in last 24 hours with improvement in sr cr 10/07  intubated overnight .  10/7 > 10/14 diuresed aggressively 10/14  Attempting wean 10/15  Weaning 10/21  Awake, following commands, PSV, febrile  LINES: ETT 10/7 >> 10/21 Trach 10/21   DISCUSSION: [name] with PMH of super morbid obesity, HTN, pulmonary hypertension admitted with decompensated diastolic CHF.     PULMONARY A: Acute on Chronic Hypoxemic and Hypercarbic Respiratory Failure R/O HCAP - vent x 13 days and she is failing PST despite diuresis. CXR is always suboptimal  2/2 body habitus. Fever - persistent with increased purulent secretions Secondary pulmonary hypertension Severe OSA / OHS with acute decompensation - sleep study in 2012 with up to 75 events per hour with desaturation to 72% Tongue swelling - since 10/13 compromising airway. Resolved P:   PRVC  Daily SBT with goal ATC  If de-cannulated, will need CPAP compliance Cont abx for now.  Monitor I/O status, goal even balance VAP prevention  O2 as needed to support sats > 88% Oral care  CARDIOVASCULAR A:  Hypertensive Urgency > resolved Pulm edema/Volume overload > lost 47 kgs at her peak Acute diastolic heart failure Demand ischemia Hx HTN Long pauses on telemetry in evenings P:  Hydralazine 75 mg TID  B-blocker per cardiology Amlodipine per cardiology Goal SBP < 150   Tele monitoring Off diuretics since 10/18, still (-).   RENAL A:   AKI / Azotemia 2/2 diuresis - off diuretics since 10/18 Hypernatremia Hypokalemia P:   Monitor BMET and UOP Replace electrolytes as indicated Cont free water with TF  Keep euvolemic  GASTROINTESTINAL A:   Morbid obesity Nutrition P:   TF per nutrition  Place cortrak for nutrition Outpatient obesity clinic referral  HEMATOLOGIC A:   VTE prophylaxis P:  SCD's / Lovenox for DVT prophylaxis CBC daily  INFECTIOUS A:   Possible hcap. Persistent fevers with inc purulent ET secretions.  P:   ABX as above  Follow cultures  Trend PCT > negative  Observe post trach, if improved de-escalate abx if remains negative  ENDOCRINE A:   No acute issues P:   No interventions required  NEUROLOGIC A:   Acute encephalopathy - in the setting of hypercarbia.  Altered Mental Status - intermittent  sometimes she follows commands, sometimes she does not > meds related,  not sure if related to psych issues (pt with depression) Depression - family reports pt is in "denial" - seen by Psych 05/28/16 P:   RASS goal: 0 Minimize sedating  medications PT efforts OOB to chair    FAMILY  - Updates: Family updated 10/21 at bedside.    - Inter-disciplinary family meet or Palliative Care meeting due by: 10/12  Noe Gens, NP-C [facility] Pgr: [phone] or if no answer [phone] 06/12/2016, 12:47 PM

## 2016-06-12 NOTE — Op Note (Signed)
[name], [name]               ACCOUNT NO.:  [id number]  MEDICAL RECORD NO.:  [id number]  LOCATION:  2H07C                        FACILITY:  [facility]  PHYSICIAN:  Onnie Graham, MD     DATE OF BIRTH:  09/29/1980  DATE OF PROCEDURE:  06/11/2016 DATE OF DISCHARGE:                              OPERATIVE REPORT   PREOPERATIVE DIAGNOSIS:  Respiratory failure and obstructive sleep apnea.  POSTOPERATIVE DIAGNOSIS:  Respiratory failure and obstructive sleep apnea.  PROCEDURE:  Tracheostomy.  SURGEON:  Onnie Graham, MD  ANESTHESIA:  General endotracheal anesthesia.  COMPLICATIONS:  None.  INDICATION:  The patient is a 35 year old female with untreated obstructive sleep apnea, who presented to hospital with worsening respiratory failure and required mechanical ventilation.  She has not been able to be weaned from the ventilator successfully and presents to the operating room for tracheostomy.  FINDINGS:  The anatomy of the neck was normal aside from obesity.  DESCRIPTION OF PROCEDURE:  The patient was identified in the ICU and informed consent having been obtained from her mother including discussion of risks, benefits, alternatives, the patient brought was brought to the operative suite, and put on the operative table in supine position.  Anesthesia was maintained via endotracheal anesthesia.  A shoulder roll was placed and the chest and neck were taped in retracted positions.  The anterior neck was prepped and draped in sterile fashion. The incision was marked with a marking pen and injected with 1% lidocaine with 1:100,000 epinephrine.  Incision was made in a vertical fashion using a 15 blade scalpel and extended through the subcutaneous tissues using Bovie electrocautery including removal of a segment of fatty tissue under the skin.  The midline raphae of the strap muscles were then identified and divided and strap muscles were retracted either side.  The stiffness of the  thyroid gland was then identified and divided using Bovie electrocautery and retracted off the anterior tracheal wall.  Anterior tracheal wall was cleared of soft tissues.  The cricoid was retracted superiorly and an incision was then made between rings 2 and 3 of the anterior tracheal wall using a 15 blade scalpel. The incision was extended with scissors and stay sutures consisting of 2- 0 silk suture were then placed around the ring, above and the ring below the trach site.  The endotracheal tube cuff was deflated and the tube backed out above the tracheostomy site.  A #6 proximal extended length cuffed tracheostomy tube was then placed into the tracheostomy site without difficulty and the cuff inflated.  The inner cannula was placed and the anesthesia circuit was hooked to the tracheostomy tube where she was successfully ventilated after a very short period of decrease in saturation.  The stay sutures were tied with two knots above and 1 knot below the trach site.  The drapes were removed and the patient was cleaned off.  The flange of the tracheostomy tube was then secured to the anterior skin using 0 silk suture in 4 quadrants.  The right-sided central line was redressed and a trach dressing and Velcro trach tie were added for the tracheostomy.  She was then returned to Anesthesia for transport, was moved  back to her intensive care unit bed in stable condition.     Onnie Graham, MD     DDB/MEDQ  D:  06/11/2016  T:  06/12/2016  Job:  [id number]

## 2016-06-12 NOTE — Progress Notes (Signed)
Pharmacy Antibiotic Note  [name] [name] is a 35 y.o. female admitted on 05/25/2016 with pneumonia.  Pharmacy has been consulted for Vancomycin and Zosyn dosing.  Day # 4 Vanc and Zosyn. Cultures negative to date. Now afebrile, WBC 7.6.  Creatinine has trended down, antibiotic regimens remain appropriate. S/p trach yesterday.  Plan:  Continue Vancomycin 1250 mg IV q12hrs.  Target vanc troughs 15-20 mcg/ml.  Will check vanc trough by 10/23 if Vanc to continue.  Continue Zosyn 3.375 gm IV q8hrs (each over 4 hrs).  Follow renal function, culture data, progress, and antibiotic plans.  Height: 5' 3" (160 cm) Weight: (!) 314 lb 8 oz (142.7 kg) IBW/kg (Calculated) : 52.4  Temp (24hrs), Avg:98 F (36.7 C), Min:97.4 F (36.3 C), Max:98.6 F (37 C)   Recent Labs Lab 06/08/16 0342 06/09/16 0458 06/10/16 0500 06/11/16 0500 06/12/16 0501  WBC 6.0 5.5 6.8 7.5 7.6  CREATININE 1.09* 1.30* 1.24* 1.12* 0.93    Estimated Creatinine Clearance: 116.8 mL/min (by C-G formula based on SCr of 0.93 mg/dL).    Allergies  Allergen Reactions  . No Known Allergies     Antimicrobials this admission: Flucon oral for thrush 10/5>>10/13 Zosyn 10/18>> Vanc 10/18>>  Dose adjustments this admission:  n/a  Microbiology results: 10/5 MRSA PCR: neg 10/17 TA: normal respiratory flora 10/18 BCx: ng x 2 days so far 10/19 TA - normal respiratory flora  Thank you for allowing pharmacy to be a part of this patient's care.  Arty Baumgartner, [place] Pager: [phone] 06/12/2016 10:51 AM

## 2016-06-12 NOTE — Progress Notes (Signed)
Physical Therapy Treatment Patient Details Name: [name] [name] MRN: [id number] DOB: 10-10-1980 Today's Date: 06/12/2016    History of Present Illness [name] with PMH of morbid obesity (469 lbs), DM, HTN and tachycardia who presented to [facility] on 10/3 with reports of abdominal bloating and heavy abdominal pressure.  Intubated 05/29/16 to present, trached 06/10/16.    PT Comments    Pt admitted with above diagnosis. Pt currently with functional limitations due to balance and endurance deficits. Pt was able to tolerate tilt bed again to 68 degrees with trach irritating some with standing with tilt bed.  Pt overall with more movement to command today and more animated and participatory as well.  Pt progressing.  Pt will benefit from skilled PT to increase their independence and safety with mobility to allow discharge to the venue listed below.    Follow Up Recommendations  LTACH;Supervision/Assistance - 24 hour     Equipment Recommendations  Other (comment) (TBA)    Recommendations for Other Services       Precautions / Restrictions Precautions Precautions: Fall Precaution Comments: vent with trach Restrictions Weight Bearing Restrictions: No    Mobility  Bed Mobility               General bed mobility comments: Moved pt up in bed with total assist.  Used VitalGo total lift bed to progressively incline pt to 68 degrees.  Once pt at 68 degrees, pt did adjust shoulders and move UES multiple x  to command.  Pt was assisting with bringing UEs to face and head.  Pt tracking with eyes and head to command both directions but more difficult to get her to track to right.  Pt with better consistentcy following commands.  Transfers                    Ambulation/Gait                 Stairs            Wheelchair Mobility    Modified Rankin (Stroke Patients Only)       Balance Overall balance assessment: Needs assistance Sitting-balance support: Bilateral  upper extremity supported;Feet supported Sitting balance-Leahy Scale: Poor Sitting balance - Comments: needed UE support and foot plate in chair position.  Leans to left and needs pillows to upright posture in chair position at 60 degrees.   Standing balance support: No upper extremity supported;During functional activity Standing balance-Leahy Scale: Zero Standing balance comment: Vital Go lift bed  at 68 degree position with total assist of 2 persons.  Two attempts as HR incr to 133 and O2 sats down to <90% when pt coughing.  Gave a 5 min break with nurse suctioning pt after pt had stood for 7 min and then inclined again for about 8 min until pt fatigued.                     Cognition Arousal/Alertness: Awake/alert Behavior During Therapy: WFL for tasks assessed/performed Overall Cognitive Status: Impaired/Different from baseline Area of Impairment: Attention   Current Attention Level: Sustained   Following Commands: Follows one step commands with increased time     Problem Solving: Slow processing;Decreased initiation;Requires verbal cues;Requires tactile cues General Comments: Pt much more animated and indicating reliable yes/no. Continues to be distractible and have delayed response to commands/questions.    Exercises General Exercises - Upper Extremity Shoulder Flexion: Both;PROM;AAROM;5 reps;Seated Shoulder Horizontal ABduction: AAROM;Both;5 reps;Seated Shoulder Horizontal ADduction: AAROM;Both;5  reps;Seated Elbow Flexion: AAROM;Both;5 reps;Seated;Standing;AROM Elbow Extension: AAROM;AROM;Both;5 reps;Seated;Standing Digit Composite Flexion: AROM;Both;5 reps;Seated Composite Extension: PROM;Both;5 reps;Seated General Exercises - Lower Extremity Ankle Circles/Pumps: AROM;Both;5 reps;Supine Quad Sets: AROM;Both;10 reps;Supine Gluteal Sets: AROM;Both;5 reps;Supine Heel Slides: Supine;Both;5 reps;AAROM    General Comments General comments (skin integrity, edema, etc.):  Pt on 40% FiO2 with PEEP 5, full vent support; HR 109-133 bpm.  Sats >90% on vent with trach with desat x 1 when pt needed to be suctioned.        Pertinent Vitals/Pain Pain Assessment: Faces Faces Pain Scale: Hurts a little bit Pain Location: trach site with coughing Pain Descriptors / Indicators: Aching;Grimacing Pain Intervention(s): Limited activity within patient's tolerance;Monitored during session;Repositioned  SEE vitals above    Home Living                      Prior Function            PT Goals (current goals can now be found in the care plan section) Acute Rehab PT Goals Patient Stated Goal: unable to state Progress towards PT goals: Progressing toward goals    Frequency    Min 3X/week      PT Plan Current plan remains appropriate    Co-evaluation PT/OT/SLP Co-Evaluation/Treatment: Yes Reason for Co-Treatment: For patient/therapist safety PT goals addressed during session: Mobility/safety with mobility OT goals addressed during session: Strengthening/ROM;ADL's and self-care     End of Session Equipment Utilized During Treatment: Oxygen (on vent with trach) Activity Tolerance: Patient limited by fatigue Patient left: in bed;with call bell/phone within reach (in chair position)     Time: 2241-1464 PT Time Calculation (min) (ACUTE ONLY): 50 min  Charges:  $Therapeutic Activity: 23-37 mins                    G CodesDenice Paradise 06/22/16, 4:12 PM Ke[place] Pocahontas Cohenour,PT Acute Rehabilitation [phone] (pager)

## 2016-06-13 ENCOUNTER — Inpatient Hospital Stay (HOSPITAL_COMMUNITY): Payer: PRIVATE HEALTH INSURANCE

## 2016-06-13 LAB — GLUCOSE, CAPILLARY
Glucose-Capillary: 112 mg/dL — ABNORMAL HIGH (ref 65–99)
Glucose-Capillary: 116 mg/dL — ABNORMAL HIGH (ref 65–99)
Glucose-Capillary: 121 mg/dL — ABNORMAL HIGH (ref 65–99)
Glucose-Capillary: 124 mg/dL — ABNORMAL HIGH (ref 65–99)
Glucose-Capillary: 126 mg/dL — ABNORMAL HIGH (ref 65–99)
Glucose-Capillary: 128 mg/dL — ABNORMAL HIGH (ref 65–99)

## 2016-06-13 MED ORDER — WHITE PETROLATUM GEL
Status: AC
  Filled 2016-06-13: qty 1

## 2016-06-13 MED ORDER — POTASSIUM CHLORIDE 10 MEQ/100ML IV SOLN
10.0000 meq | INTRAVENOUS | Status: AC
  Filled 2016-06-13: qty 100

## 2016-06-13 MED ORDER — WHITE PETROLATUM GEL
Status: DC | PRN
  Administered 2016-06-13: 12:00:00 via TOPICAL
  Administered 2016-06-13: 1 via TOPICAL

## 2016-06-13 NOTE — Progress Notes (Signed)
Dr. Oletta Darter informed of patient pulling out cortrak tube. New order received to place NGT in order to administer feedings and meds tonight.

## 2016-06-13 NOTE — Progress Notes (Addendum)
Patient Name: [name] [name] Date of Encounter: 06/13/2016  Primary Cardiologist: [facility] Problem List     Principal Problem:   Acute congestive heart failure [facility]) Active Problems:   Malignant hypertension   Morbid obesity (HCC)   OSA (obstructive sleep apnea)   Renal insufficiency   Acute pulmonary edema (HCC)   Abdominal pain   Hepatic congestion   Sinus tachycardia   Pericardial effusion   Pulmonary hypertension   Acute respiratory failure with hypoxemia (HCC)   Difficult intravenous access   Sinus pause   HCAP (healthcare-associated pneumonia)   Respiratory failure ([place])     Subjective   Breathing comfortably. Stood with PT yesterday. Answering questions.  Inpatient Medications    Scheduled Meds: . amLODipine  10 mg Per Tube Daily  . chlorhexidine gluconate (MEDLINE KIT)  15 mL Mouth Rinse BID  . docusate  100 mg Per Tube BID  . enoxaparin (LOVENOX) injection  70 mg Subcutaneous Q24H  . famotidine (PEPCID) IV  20 mg Intravenous Q12H  . feeding supplement (PRO-STAT SUGAR FREE 64)  60 mL Per Tube TID  . feeding supplement (VITAL HIGH PROTEIN)  1,000 mL Per Tube Q24H  . free water  200 mL Per Tube Q4H  . hydrALAZINE  75 mg Oral Q8H  . insulin aspart  2-6 Units Subcutaneous Q4H  . isosorbide dinitrate  30 mg Oral BID  . magnesium hydroxide  30 mL Per Tube Daily  . mouth rinse  15 mL Mouth Rinse Q4H  . piperacillin-tazobactam (ZOSYN)  IV  3.375 g Intravenous Q8H  . polyethylene glycol  17 g Oral Daily  . potassium chloride  10 mEq Intravenous Q1 Hr x 2  . potassium chloride  40 mEq Per Tube BID  . sodium chloride flush  10-40 mL Intracatheter Q12H  . sodium chloride flush  3 mL Intravenous Q12H   Continuous Infusions:   PRN Meds: acetaminophen (TYLENOL) oral liquid 160 mg/5 mL, fentaNYL (SUBLIMAZE) injection, hydrALAZINE, ondansetron **OR** ondansetron (ZOFRAN) IV, sodium chloride flush   Vital Signs    Vitals:   06/13/16 0736 06/13/16 0746  06/13/16 0800 06/13/16 0900  BP: (!) 168/99 (!) 158/104 (!) 152/97 (!) 167/110  Pulse: (!) 117 99 (!) 101 (!) 111  Resp: (!) 23 (!) 22 (!) 21 (!) 24  Temp:      TempSrc:      SpO2: 96% 94% 94% 94%  Weight:      Height:        Intake/Output Summary (Last 24 hours) at 06/13/16 0926 Last data filed at 06/13/16 0900  Gross per 24 hour  Intake          3245.08 ml  Output             1175 ml  Net          2070.08 ml   Filed Weights   06/07/16 0442 06/08/16 0455 06/09/16 0400  Weight: (!) 153.3 kg (338 lb) (!) 150.8 kg (332 lb 8 oz) (!) 142.7 kg (314 lb 8 oz)    Physical Exam   GEN: Well nourished, well developed, in no acute distress.  HEENT: Grossly normal.  Neck: Supple, no JVD, carotid bruits, or masses. Cardiac: RRR, no murmurs, rubs, or gallops. No clubbing, cyanosis, edema.  Radials/DP/PT 2+ and equal bilaterally.  Respiratory:  Respirations regular and unlabored, clear to auscultation bilaterally. GI: Soft, nontender, nondistended, BS + x 4. MS: no deformity or atrophy. Skin: warm and dry, no rash. Neuro:  Strength and sensation are intact. Psych: AAOx3.  Normal affect.  Labs    CBC  Recent Labs  06/11/16 0500 06/12/16 0501  WBC 7.5 7.6  HGB 14.1 14.7  HCT 48.9* 51.3*  MCV 85.5 85.1  PLT 170 379   Basic Metabolic Panel  Recent Labs  06/11/16 0500 06/12/16 0501  NA 157* 157*  K 3.7 3.2*  CL 113* 117*  CO2 35* 32  GLUCOSE 130* 125*  BUN 72* 50*  CREATININE 1.12* 0.93  CALCIUM 9.6 10.0  MG 2.7* 2.4  PHOS 4.5 2.6   Liver Function Tests No results for input(s): AST, ALT, ALKPHOS, BILITOT, PROT, ALBUMIN in the last 72 hours. No results for input(s): LIPASE, AMYLASE in the last 72 hours. Cardiac Enzymes No results for input(s): CKTOTAL, CKMB, CKMBINDEX, TROPONINI in the last 72 hours. BNP Invalid input(s): POCBNP D-Dimer No results for input(s): DDIMER in the last 72 hours. Hemoglobin A1C No results for input(s): HGBA1C in the last 72  hours. Fasting Lipid Panel No results for input(s): CHOL, HDL, LDLCALC, TRIG, CHOLHDL, LDLDIRECT in the last 72 hours. Thyroid Function Tests No results for input(s): TSH, T4TOTAL, T3FREE, THYROIDAB in the last 72 hours.  Invalid input(s): FREET3  Telemetry    Sinus tach - Personally Reviewed  ECG    None recent  Radiology    Dg Abd Portable 1v  Result Date: 06/12/2016 CLINICAL DATA:  Nasogastric tube placement. EXAM: PORTABLE ABDOMEN - 1 VIEW COMPARISON:  Abdominal radiograph June 12, 2016 FINDINGS: Nasogastric tube tip projects in proximal stomach, interval removal of feeding tube. Side port of nasogastric tube not identified. Bowel gas pattern is nondilated and nonobstructive. Large body habitus. IMPRESSION: Nasogastric tube tip projects in proximal stomach. Electronically Signed   By: Elon Alas M.D.   On: 06/12/2016 23:32   Dg Abd Portable 1v  Result Date: 06/12/2016 CLINICAL DATA:  35 year old female status post feeding tube placement. EXAM: PORTABLE ABDOMEN - 1 VIEW COMPARISON:  05/28/2016. FINDINGS: Feeding tube in position with tip in the antral pre-pyloric region of the stomach. Visualized bowel gas pattern is unremarkable. IMPRESSION: Tip of feeding tube in the antral pre-pyloric region of the stomach. Electronically Signed   By: Vinnie Langton M.D.   On: 06/12/2016 16:55    Cardiac Studies   EF 50-55%  Patient Profile     35 y/o with diastolic heart failure  Assessment & Plan    Acute diastolic HF  - ECHO with normal EF, mild pulm HTN.   - Stopped lasix 10/18, secondary to BUN and creat elevated. BUN / Cr back to normal. Holding Lasix as well as Diamox.  Na still elevated.  - CVP 8 two days ago.  CVP  5 Mm Hg yesterday. + 2Liters since yesterday. CVP currently unavailable.  Unable to get an accurate weight today as the bed was zeroed with the patient in the bed.  Bed showing 7 kg, indicating that her weight may be up 7 kg.  Unsure if that is accurate.   O2 sats are unchanged. Continue to hold diuretics.   BP reasonably controlled today.  Hypokalemia: replaced potassium  Signed, Larae Grooms, MD  06/13/2016, 9:26 AM

## 2016-06-13 NOTE — Anesthesia Postprocedure Evaluation (Signed)
Anesthesia Post Note  Patient: [name] [name]  Procedure(s) Performed: Procedure(s) (LRB): TRACHEOSTOMY (N/A)  Patient location during evaluation: ICU Anesthesia Type: General Level of consciousness: sedated Pain management: pain level controlled Vital Signs Assessment: post-procedure vital signs reviewed and stable Respiratory status: patient connected to tracheostomy mask oxygen Cardiovascular status: stable Postop Assessment: no signs of nausea or vomiting Anesthetic complications: no    Last Vitals:  Vitals:   06/13/16 1700 06/13/16 1800  BP: (!) 156/101 (!) 147/92  Pulse: (!) 106 (!) 105  Resp: (!) 24 20  Temp:      Last Pain:  Vitals:   06/13/16 1500  TempSrc: Oral  PainSc: 0-No pain                 Shadoe Bethel

## 2016-06-13 NOTE — Progress Notes (Signed)
Ben from [employer] called into patients room and stated that NGT placed previously was okay to use per Grandfather who reviewed x-ray. He also stated that they suggested to advance "just a few cm's" just in case due to patients size. Tube advanced slightly, patient tolerated well. TF started back at this time at rate of 25cc/hr.

## 2016-06-13 NOTE — Progress Notes (Signed)
PULMONARY / CRITICAL CARE MEDICINE   Name: [name] [name] MRN: [id number] DOB: 12-27-1980    ADMISSION DATE:  05/25/2016 CONSULTATION DATE:  05/27/16  REFERRING MD:  Dr. Ree Kida - [facility]  CHIEF COMPLAINT:  Altered Mental Status   BRIEF: [name] with PMH of morbid obesity (469 lbs), DM, HTN and tachycardia who presented to [facility] on 10/3 with acute on chronic respiratory failure with hypercarbia and hypoxemia due to diastolic CHF exacerbation and obesity hypoventilation syndrome.  Ultimately required tracheostomy due to prolonged respiratory failure. Diuresed over 100 lbs.   SUBJECTIVE:  Pt denies acute complaints.  Weaning on PSV 15/5    VITAL SIGNS: BP (!) 158/104   Pulse 99   Temp 98.2 F (36.8 C) (Oral)   Resp (!) 22   Ht _0  (1.6 m)   Wt (!) 314 lb 8 oz (142.7 kg)   LMP  (LMP Unknown)   SpO2 94%   BMI 55.71 kg/m   HEMODYNAMICS:    VENTILATOR SETTINGS: Vent Mode: PSV FiO2 (%):  [40 %] 40 % Set Rate:  [25 bmp] 25 bmp Vt Set:  [420 mL] 420 mL PEEP:  [5 cmH20] 5 cmH20 Pressure Support:  [15 cmH20] 15 cmH20 Plateau Pressure:  [18 cmH20-22 cmH20] 19 cmH20  INTAKE / OUTPUT: I/O last 3 completed shifts: In: 3613.1 [I.V.:286.9; NG/GT:2026.3; IV Piggyback:1300] Out: 1895 [Urine:1570; Stool:325]  PHYSICAL EXAMINATION: General: awake. Follows commands. (+) erythema/blood clot in R eye 2/2 coughing hard    HENT: NCAT, trach midline c/d/i PULM:  Non-labored on PSV, breath sounds coarse  CV: RRR, no mgr GI: belly soft, nontender, NGT in place Derm: warm/dry, trace edema  Neuro: opens eyes, follows commands, attempts to communicate appropriately   LABS: PULMONARY  Recent Labs Lab 06/08/16 1030  PHART 7.306*  PCO2ART 86.1*  PO2ART 80.3*  HCO3 41.7*  O2SAT 93.7   CBC  Recent Labs Lab 06/10/16 0500 06/11/16 0500 06/12/16 0501  HGB 13.7 14.1 14.7  HCT 47.9* 48.9* 51.3*  WBC 6.8 7.5 7.6  PLT 230 170 159   COAGULATION No results for input(s): INR in the last  168 hours.   CARDIAC   No results for input(s): TROPONINI in the last 168 hours. No results for input(s): PROBNP in the last 168 hours.  CHEMISTRY  Recent Labs Lab 06/08/16 0342 06/09/16 0458 06/10/16 0500 06/11/16 0500 06/12/16 0501  NA 151* 149* 153* 157* 157*  K 4.3 4.4 4.2 3.7 3.2*  CL 102 100* 109 113* 117*  CO2 39* 38* 36* 35* 32  GLUCOSE 172* 190* 135* 130* 125*  BUN 65* 83* 90* 72* 50*  CREATININE 1.09* 1.30* 1.24* 1.12* 0.93  CALCIUM 9.9 10.2 9.8 9.6 10.0  MG 2.7* 2.8* 2.6* 2.7* 2.4  PHOS 3.3 5.2* 4.3 4.5 2.6   Estimated Creatinine Clearance: 116.8 mL/min (by C-G formula based on SCr of 0.93 mg/dL).  LIVER No results for input(s): AST, ALT, ALKPHOS, BILITOT, PROT, ALBUMIN, INR in the last 168 hours.   INFECTIOUS  Recent Labs Lab 06/09/16 0911 06/10/16 0500 06/11/16 0500  PROCALCITON 0.10 <0.10 0.17   ENDOCRINE CBG (last 3)   Recent Labs  06/12/16 2328 06/13/16 0351 06/13/16 0748  GLUCAP 104* 116* 121*    STUDIES:  ECHO 10/4 >> EF 55%, PAP 42  CULTURES: Trache 10/17 > Normal flora Blood 10/18 >  neg Trach 10/19 > normal flora   ANTIBIOTICS: Vanc 10/18 >> 10/22 Zosyn 10/18 >>  SIGNIFICANT EVENTS: 10/03  Admit 10/05  Transferred  to ICU due to AMS, hypercarbic respiratory failure 10/06  Bipap, ntg gtt & BP remains elevated in 161'W systolic.  Net neg 5.5 L in last 24 hours with improvement in sr cr 10/07  intubated overnight .  10/07 -10/14 diuresed aggressively 10/14  Attempting wean 10/15  Weaning 10/18  febrile 10/21  Awake, following commands, PSV   LINES: ETT 10/7 >> 10/21 Trach 10/21 >> R IJ TLC 10/10 >>    DISCUSSION: [name] with PMH of super morbid obesity, HTN, pulmonary hypertension admitted with decompensated diastolic CHF.     PULMONARY A: Acute on Chronic Hypoxemic and Hypercarbic Respiratory Failure R/O HCAP - vent x 13 days and she is failing PST despite diuresis. CXR is always suboptimal 2/2 body habitus.  Fever - persistent with increased purulent secretions Secondary pulmonary hypertension Severe OSA / OHS with acute decompensation - sleep study in 2012 with up to 75 events per hour with desaturation to 72% Tongue swelling - since 10/13 compromising airway. Resolved P:   PRVC  Daily SBT with goal ATC  If de-cannulated, will need CPAP compliance Cont abx for now.  Monitor I/O status, goal even balance VAP prevention  O2 as needed to support sats > 88% Oral care  CARDIOVASCULAR A:  Hypertensive Urgency > resolved Pulm edema/Volume overload > lost 47 kgs at her peak Acute diastolic heart failure Demand ischemia Hx HTN Long pauses on telemetry in evenings P:  Hydralazine 75 mg TID  B-blocker per cardiology Amlodipine per cardiology Goal SBP < 150   Tele monitoring Off diuretics since 10/18, still (-), monitor I/O's  RENAL A:   AKI / Azotemia 2/2 diuresis - off diuretics since 10/18 Hypernatremia Hypokalemia P:   Monitor BMET and UOP Replace electrolytes as indicated Free water 200 ml Q4 Keep euvolemic KCL 40 mEQ BID   GASTROINTESTINAL A:   Morbid obesity Nutrition P:   TF per nutrition  Place cortrak for nutrition Outpatient obesity clinic referral  HEMATOLOGIC A:   VTE prophylaxis P:  SCD's / Lovenox for DVT prophylaxis CBC daily  INFECTIOUS A:   Possible hcap. Persistent fevers with inc purulent ET secretions.  P:   ABX as above, narrow 10/22 D5/x abx Follow cultures  Trend PCT > negative  Observe post trach, if improved de-escalate abx if remains negative  ENDOCRINE A:   No acute issues P:   No interventions required  NEUROLOGIC A:   Acute encephalopathy - in the setting of hypercarbia.  Altered Mental Status - intermittent  sometimes she follows commands, sometimes she does not > meds related,  not sure if related to psych issues (pt with depression) Depression - family reports pt is in "denial" - seen by Psych 05/28/16 P:   RASS  goal: 0 Minimize sedating medications PT efforts OOB to chair    FAMILY  - Updates:  Patient updated 10/22 at bedside    - Inter-disciplinary family meet or Palliative Care meeting due by: 10/12  Noe Gens, NP-C [facility] Pgr: [phone] or if no answer [phone] 06/13/2016, 8:24 AM

## 2016-06-14 ENCOUNTER — Inpatient Hospital Stay (HOSPITAL_COMMUNITY): Payer: PRIVATE HEALTH INSURANCE

## 2016-06-14 LAB — BASIC METABOLIC PANEL
Anion gap: 9 (ref 5–15)
BUN: 48 mg/dL — ABNORMAL HIGH (ref 6–20)
CO2: 31 mmol/L (ref 22–32)
Calcium: 9.9 mg/dL (ref 8.9–10.3)
Chloride: 120 mmol/L — ABNORMAL HIGH (ref 101–111)
Creatinine, Ser: 0.88 mg/dL (ref 0.44–1.00)
GFR calc Af Amer: >60 mL/min (ref >60)
GFR calc non Af Amer: >60 mL/min (ref >60)
Glucose, Bld: 116 mg/dL — ABNORMAL HIGH (ref 65–99)
Potassium: 3.8 mmol/L (ref 3.5–5.1)
Sodium: 160 mmol/L — ABNORMAL HIGH (ref 135–145)

## 2016-06-14 LAB — GLUCOSE, CAPILLARY
Glucose-Capillary: 111 mg/dL — ABNORMAL HIGH (ref 65–99)
Glucose-Capillary: 114 mg/dL — ABNORMAL HIGH (ref 65–99)
Glucose-Capillary: 115 mg/dL — ABNORMAL HIGH (ref 65–99)
Glucose-Capillary: 124 mg/dL — ABNORMAL HIGH (ref 65–99)
Glucose-Capillary: 128 mg/dL — ABNORMAL HIGH (ref 65–99)
Glucose-Capillary: 162 mg/dL — ABNORMAL HIGH (ref 65–99)

## 2016-06-14 LAB — CBC
HCT: 49.4 % — ABNORMAL HIGH (ref 36.0–46.0)
Hemoglobin: 14 g/dL (ref 12.0–15.0)
MCH: 24.1 pg — ABNORMAL LOW (ref 26.0–34.0)
MCHC: 28.3 g/dL — ABNORMAL LOW (ref 30.0–36.0)
MCV: 84.9 fL (ref 78.0–100.0)
Platelets: 180 10*3/uL (ref 150–400)
RBC: 5.82 MIL/uL — ABNORMAL HIGH (ref 3.87–5.11)
RDW: 23.2 % — ABNORMAL HIGH (ref 11.5–15.5)
WBC: 6.5 10*3/uL (ref 4.0–10.5)

## 2016-06-14 LAB — CULTURE, BLOOD (ROUTINE X 2)
Culture: NO GROWTH
Culture: NO GROWTH

## 2016-06-14 MED ORDER — LABETALOL HCL 200 MG PO TABS
200.0000 mg | ORAL_TABLET | Freq: Two times a day (BID) | ORAL | Status: DC
  Administered 2016-06-14 – 2016-06-27 (×27): 200 mg
  Filled 2016-06-14 (×27): qty 1

## 2016-06-14 MED ORDER — FUROSEMIDE 10 MG/ML IJ SOLN
60.0000 mg | Freq: Two times a day (BID) | INTRAMUSCULAR | Status: DC
  Administered 2016-06-14 – 2016-06-15 (×3): 60 mg via INTRAVENOUS
  Filled 2016-06-14 (×2): qty 6

## 2016-06-14 MED ORDER — SODIUM CHLORIDE 0.9% FLUSH: 10.0000 mL | INTRAVENOUS | Status: DC | PRN

## 2016-06-14 MED ORDER — FREE WATER
250.0000 mL | Status: DC
  Administered 2016-06-14 – 2016-06-16 (×9): 250 mL

## 2016-06-14 MED ORDER — SODIUM CHLORIDE 0.9% FLUSH
10.0000 mL | Freq: Two times a day (BID) | INTRAVENOUS | Status: DC
  Administered 2016-06-14 – 2016-06-15 (×3): 10 mL

## 2016-06-14 MED ORDER — DEXTROSE 5 % IV SOLN
INTRAVENOUS | Status: DC
  Administered 2016-06-14: 11:00:00 via INTRAVENOUS

## 2016-06-14 NOTE — Progress Notes (Signed)
Physical Therapy Treatment Patient Details Name: Jadyn Brasher MRN: [id number] DOB: December 09, 1980 Today's Date: 06/14/2016    History of Present Illness [name] with PMH of morbid obesity (469 lbs), DM, HTN and tachycardia who presented to [facility] on 10/3 with reports of abdominal bloating and heavy abdominal pressure.  Intubated 05/29/16 to present, trached 06/10/16.    PT Comments    Pt admitted with above diagnosis. Pt currently with functional limitations due to balance and endurance deficits. Pt was able to achieve full standing with Vital Go lift bed today for 15 minutes.  Progressing well. Daughter and mom present and daughter would hold pts hand and hugged pt as well.  Pt in good spirits and worked really hard during treatment today.   Pt will benefit from skilled PT to increase their independence and safety with mobility to allow discharge to the venue listed below.    Follow Up Recommendations  LTACH;Supervision/Assistance - 24 hour     Equipment Recommendations  Other (comment) (TBA)    Recommendations for Other Services       Precautions / Restrictions Precautions Precautions: Fall Precaution Comments: 40% trach collar Restrictions Weight Bearing Restrictions: No    Mobility  Bed Mobility Overal bed mobility: Needs Assistance       Supine to sit: Mod assist;+2 for physical assistance     General bed mobility comments: Progressively reclined pt to full chair position with pt sitting 15 minutes without back support reaching and able to sit up with mod assist initially but progressing to min guard assist.  Laid bed back down and moved pt up in bed with total assist.  Used VitalGo total lift bed to progressively incline pt to full standing position.  Once pt in full standing adjusted straps so that pt could perform squats with pt using RW for stability.  Pt performed 10 squats.  shifting weight as well.  Pt at times leaning chest forward and needing assist to lean back  against bed.  Pt asked to wash her face and gave her cloth once she was semi reclined.   Pt tracking with eyes and head to command both directions.Pt with better consistentcy following commands.  Transfers Overall transfer level: Needs assistance Equipment used: Rolling walker (2 wheeled) Transfers: Sit to/from Stand Sit to Stand: +2 physical assistance;From elevated surface;Max assist;Total assist         General transfer comment: Used Vital Go lift bed to get pt in standing with pt supported by bed and straps. Stood a total of 15 minutes with bed in full upright position.   Ambulation/Gait                 Stairs            Wheelchair Mobility    Modified Rankin (Stroke Patients Only)       Balance Overall balance assessment: Needs assistance Sitting-balance support: Bilateral upper extremity supported;Feet supported Sitting balance-Leahy Scale: Poor Sitting balance - Comments: needed UE support initially but was able to get balance and sat min guard assist for 10 minutes.  Was able to perform some reaching tasks as well.    Standing balance support: Bilateral upper extremity supported;During functional activity Standing balance-Leahy Scale: Zero Standing balance comment: Vital Go lift bed at full standing position for15 minutes.  Pt with support of straps but straps loosened so pt could perform squats.  Cognition Arousal/Alertness: Awake/alert Behavior During Therapy: WFL for tasks assessed/performed Overall Cognitive Status: Impaired/Different from baseline Area of Impairment: Attention Orientation Level: Disoriented to;Time Current Attention Level: Sustained Memory: Decreased recall of precautions Following Commands: Follows one step commands consistently;Follows multi-step commands inconsistently Safety/Judgement: Decreased awareness of deficits   Problem Solving: Requires verbal cues;Requires tactile cues;Difficulty  sequencing General Comments: Pt much more animated and indicating reliable yes/no. More focused today in treatment.    Exercises General Exercises - Lower Extremity Ankle Circles/Pumps: AROM;Both;5 reps;Supine Quad Sets: AROM;Both;10 reps;Supine Gluteal Sets: AROM;Both;5 reps;Supine Mini-Sqauts: AROM;Both;10 reps;Standing    General Comments General comments (skin integrity, edema, etc.): Pt on 40% trach collar with sats >90% entire time.  HR and BP stable as well.       Pertinent Vitals/Pain Pain Assessment: No/denies pain  VSS see above    Home Living                      Prior Function            PT Goals (current goals can now be found in the care plan section) Progress towards PT goals: Progressing toward goals    Frequency    Min 3X/week      PT Plan Current plan remains appropriate    Co-evaluation             End of Session Equipment Utilized During Treatment: Oxygen (trach collar 40%) Activity Tolerance: Patient limited by fatigue Patient left: in bed;with call bell/phone within reach (in chair position at 50 degrees)     Time: 1355-1457 PT Time Calculation (min) (ACUTE ONLY): 62 min  Charges:  $Therapeutic Exercise: 8-22 mins $Therapeutic Activity: 38-52 mins                    G CodesDenice Paradise 07-08-16, 3:38 PM [facility] [facility] [phone] (pager)

## 2016-06-14 NOTE — Progress Notes (Signed)
Suctioned patient discussed placing her back on vent for night rest and explained why.  Inflated cuff placed pt on original settings.  Pt tolerating well.

## 2016-06-14 NOTE — Progress Notes (Signed)
Patient Name: [name] [name] Date of Encounter: 06/14/2016  Primary Cardiologist: [facility] Problem List     Principal Problem:   Acute congestive heart failure [facility]) Active Problems:   Malignant hypertension   Morbid obesity (HCC)   OSA (obstructive sleep apnea)   Renal insufficiency   Acute pulmonary edema (HCC)   Abdominal pain   Hepatic congestion   Sinus tachycardia   Pericardial effusion   Pulmonary hypertension   Acute respiratory failure with hypoxemia (HCC)   Difficult intravenous access   Sinus pause   HCAP (healthcare-associated pneumonia)   Respiratory failure (HCC)     Subjective   Breathing better - on trach collar with NGTF. Net positive yesterday - diuretics held but significant intake.  Inpatient Medications    Scheduled Meds: . amLODipine  10 mg Per Tube Daily  . chlorhexidine gluconate (MEDLINE KIT)  15 mL Mouth Rinse BID  . docusate  100 mg Per Tube BID  . enoxaparin (LOVENOX) injection  70 mg Subcutaneous Q24H  . famotidine (PEPCID) IV  20 mg Intravenous Q12H  . feeding supplement (PRO-STAT SUGAR FREE 64)  60 mL Per Tube TID  . feeding supplement (VITAL HIGH PROTEIN)  1,000 mL Per Tube Q24H  . free water  200 mL Per Tube Q4H  . hydrALAZINE  75 mg Oral Q8H  . insulin aspart  2-6 Units Subcutaneous Q4H  . isosorbide dinitrate  30 mg Oral BID  . magnesium hydroxide  30 mL Per Tube Daily  . mouth rinse  15 mL Mouth Rinse Q4H  . piperacillin-tazobactam (ZOSYN)  IV  3.375 g Intravenous Q8H  . polyethylene glycol  17 g Oral Daily  . potassium chloride  40 mEq Per Tube BID  . sodium chloride flush  10-40 mL Intracatheter Q12H  . sodium chloride flush  3 mL Intravenous Q12H   Continuous Infusions:   PRN Meds: acetaminophen (TYLENOL) oral liquid 160 mg/5 mL, fentaNYL (SUBLIMAZE) injection, hydrALAZINE, ondansetron **OR** ondansetron (ZOFRAN) IV, sodium chloride flush, white petrolatum   Vital Signs    Vitals:   06/14/16 0500 06/14/16  0600 06/14/16 0700 06/14/16 0751  BP: (!) 165/103 (!) 154/94 (!) 159/103   Pulse: 73 99 99   Resp: (!) 22 (!) 23 (!) 21   Temp:   98.3 F (36.8 C)   TempSrc:   Oral   SpO2: 100% 98% 97% 95%  Weight: 285 lb (129.3 kg)     Height:        Intake/Output Summary (Last 24 hours) at 06/14/16 0813 Last data filed at 06/14/16 0600  Gross per 24 hour  Intake             2170 ml  Output             1050 ml  Net             1120 ml   Filed Weights   06/09/16 0400 06/13/16 1100 06/14/16 0500  Weight: (!) 314 lb 8 oz (142.7 kg) 276 lb (125.2 kg) 285 lb (129.3 kg)    Physical Exam   GEN: Well nourished, morbidly obese, well developed, in no acute distress, on trach collar HEENT: Grossly normal.  Neck: Supple, thick neck, carotid bruits, or masses. Cardiac: RRR, no murmurs, rubs, or gallops. No clubbing, cyanosis, edema.  Radials/DP/PT 2+ and equal bilaterally.  Respiratory:  Respirations regular and unlabored, clear to auscultation bilaterally. GI: Soft, nontender, nondistended, BS + x 4. MS: no deformity or atrophy. Skin:  warm and dry, no rash. Neuro:  Strength and sensation are intact. Psych: AAOx3.  Normal affect.  Labs    CBC  Recent Labs  06/12/16 0501 06/14/16 0300  WBC 7.6 6.5  HGB 14.7 14.0  HCT 51.3* 49.4*  MCV 85.1 84.9  PLT 159 213   Basic Metabolic Panel  Recent Labs  06/12/16 0501 06/14/16 0300  NA 157* 160*  K 3.2* 3.8  CL 117* 120*  CO2 32 31  GLUCOSE 125* 116*  BUN 50* 48*  CREATININE 0.93 0.88  CALCIUM 10.0 9.9  MG 2.4  --   PHOS 2.6  --    Liver Function Tests No results for input(s): AST, ALT, ALKPHOS, BILITOT, PROT, ALBUMIN in the last 72 hours. No results for input(s): LIPASE, AMYLASE in the last 72 hours. Cardiac Enzymes No results for input(s): CKTOTAL, CKMB, CKMBINDEX, TROPONINI in the last 72 hours. BNP Invalid input(s): POCBNP D-Dimer No results for input(s): DDIMER in the last 72 hours. Hemoglobin A1C No results for input(s):  HGBA1C in the last 72 hours. Fasting Lipid Panel No results for input(s): CHOL, HDL, LDLCALC, TRIG, CHOLHDL, LDLDIRECT in the last 72 hours. Thyroid Function Tests No results for input(s): TSH, T4TOTAL, T3FREE, THYROIDAB in the last 72 hours.  Invalid input(s): FREET3  Telemetry    Sinus tach - Personally Reviewed  ECG    None recent  Radiology    Dg Chest Port 1 View  Result Date: 06/14/2016 CLINICAL DATA:  Respiratory failure. EXAM: PORTABLE CHEST 1 VIEW COMPARISON:  06/07/2016. FINDINGS: Tracheostomy tube and NG tube in stable position. Right IJ line in stable position. Cardiomegaly with pulmonary venous congestion in mild bilateral from interstitial prominence again noted. Findings consistent with congestive heart failure . IMPRESSION: 1. Lines and tubes in stable position. 2. Congestive heart failure with mild pulmonary interstitial edema. Similar findings on prior exam . Electronically Signed   By: [place]   On: 06/14/2016 07:30   Dg Abd Portable 1v  Result Date: 06/13/2016 CLINICAL DATA:  35 year old female status post nasogastric tube placement. EXAM: PORTABLE ABDOMEN - 1 VIEW COMPARISON:  Abdominal radiograph 06/12/2016. FINDINGS: Nasogastric tube coiled in the stomach with tip in the fundus. Gas is noted throughout portions of the colon and small bowel. No pathologic dilatation of small bowel. Some distal rectal gas is noted. No gross evidence of pneumoperitoneum. IUD projecting over the central pelvis. IMPRESSION: 1. Nasogastric tube is in the stomach with the tip directed retrograde toward the fundus. 2. Nonobstructive bowel gas pattern. Electronically Signed   By: Vinnie Langton M.D.   On: 06/13/2016 15:22   Dg Abd Portable 1v  Result Date: 06/12/2016 CLINICAL DATA:  Nasogastric tube placement. EXAM: PORTABLE ABDOMEN - 1 VIEW COMPARISON:  Abdominal radiograph June 12, 2016 FINDINGS: Nasogastric tube tip projects in proximal stomach, interval removal of  feeding tube. Side port of nasogastric tube not identified. Bowel gas pattern is nondilated and nonobstructive. Large body habitus. IMPRESSION: Nasogastric tube tip projects in proximal stomach. Electronically Signed   By: Elon Alas M.D.   On: 06/12/2016 23:32   Dg Abd Portable 1v  Result Date: 06/12/2016 CLINICAL DATA:  35 year old female status post feeding tube placement. EXAM: PORTABLE ABDOMEN - 1 VIEW COMPARISON:  05/28/2016. FINDINGS: Feeding tube in position with tip in the antral pre-pyloric region of the stomach. Visualized bowel gas pattern is unremarkable. IMPRESSION: Tip of feeding tube in the antral pre-pyloric region of the stomach. Electronically Signed   By: Vinnie Langton  M.D.   On: 06/12/2016 16:55    Cardiac Studies   EF 50-55%  Patient Profile     35 y/o with diastolic heart failure  Assessment & Plan    Acute diastolic HF:  - ECHO with normal EF, mild pulm HTN.   - Stopped lasix 10/18, secondary to BUN and creat elevated. BUN / Cr back to normal.   - CVP 8 two days ago.  CVP  5 Mm Hg yesterday. + 2Liters since yesterday. CVP currently unavailable.  Unable to get an accurate weight today as the bed was zeroed with the patient in the bed.  Bed showing 7 kg, indicating that her weight may be up 7 kg.  Unsure if that is accurate.  O2 sats are unchanged. Continue to hold diuretics.  - Net positive fluids - restart maintenance daily diuretic, monitor creatinine.  Hypertension: remains hypertensive and tachycardic. Restart home labetalol 200 mg BID per tube.  Pixie Casino, MD, [facility] Attending Cardiologist [place], MD  06/14/2016, 8:13 AM

## 2016-06-14 NOTE — Progress Notes (Signed)
PULMONARY / CRITICAL CARE MEDICINE   Name: [name] [name] MRN: [id number] DOB: March 27, 1981    ADMISSION DATE:  05/25/2016 CONSULTATION DATE:  05/27/16  REFERRING MD:  Dr. Ree Kida - [facility]  CHIEF COMPLAINT:  Altered Mental Status   BRIEF: [name] with PMH of morbid obesity (469 lbs), DM, HTN and tachycardia who presented to [facility] on 10/3 with acute on chronic respiratory failure with hypercarbia and hypoxemia due to diastolic CHF exacerbation and obesity hypoventilation syndrome.  Ultimately required tracheostomy due to prolonged respiratory failure. Diuresed over 100 lbs.   SUBJECTIVE:  tol ATC this am.  Net POS last 24 hours.   VITAL SIGNS: BP (!) 159/103 (BP Location: Left Arm)   Pulse (!) 101   Temp 98.3 F (36.8 C) (Oral)   Resp (!) 24   Ht _0  (1.6 m)   Wt 129.3 kg (285 lb)   LMP  (LMP Unknown)   SpO2 95%   BMI 50.49 kg/m   HEMODYNAMICS:    VENTILATOR SETTINGS: Vent Mode: PRVC FiO2 (%):  [40 %] 40 % Set Rate:  [16 bmp] 16 bmp Vt Set:  [420 mL] 420 mL PEEP:  [5 cmH20] 5 cmH20 Pressure Support:  [12 cmH20] 12 cmH20 Plateau Pressure:  [13 cmH20-23 cmH20] 13 cmH20  INTAKE / OUTPUT: I/O last 3 completed shifts: In: 3396.3 [NG/GT:2796.3; IV Piggyback:600] Out: 1500 [Urine:1400; Stool:100]  PHYSICAL EXAMINATION: General: awake. Follows commands. (+) erythema/blood clot in R eye 2/2 coughing hard    HENT: NCAT, trach midline c/d/i PULM:  Non-labored on ATC, breath sounds coarse  CV: RRR, no mgr GI: belly soft, nontender, NGT in place Derm: warm/dry, trace edema  Neuro: awake, alert, follows commands, attempts to communicate appropriately   LABS: PULMONARY  Recent Labs Lab 06/08/16 1030  PHART 7.306*  PCO2ART 86.1*  PO2ART 80.3*  HCO3 41.7*  O2SAT 93.7   CBC  Recent Labs Lab 06/11/16 0500 06/12/16 0501 06/14/16 0300  HGB 14.1 14.7 14.0  HCT 48.9* 51.3* 49.4*  WBC 7.5 7.6 6.5  PLT 170 159 180   COAGULATION No results for input(s): INR in the last  168 hours.   CARDIAC   No results for input(s): TROPONINI in the last 168 hours. No results for input(s): PROBNP in the last 168 hours.  CHEMISTRY  Recent Labs Lab 06/08/16 0342 06/09/16 0458 06/10/16 0500 06/11/16 0500 06/12/16 0501 06/14/16 0300  NA 151* 149* 153* 157* 157* 160*  K 4.3 4.4 4.2 3.7 3.2* 3.8  CL 102 100* 109 113* 117* 120*  CO2 39* 38* 36* 35* 32 31  GLUCOSE 172* 190* 135* 130* 125* 116*  BUN 65* 83* 90* 72* 50* 48*  CREATININE 1.09* 1.30* 1.24* 1.12* 0.93 0.88  CALCIUM 9.9 10.2 9.8 9.6 10.0 9.9  MG 2.7* 2.8* 2.6* 2.7* 2.4  --   PHOS 3.3 5.2* 4.3 4.5 2.6  --    Estimated Creatinine Clearance: 116.1 mL/min (by C-G formula based on SCr of 0.88 mg/dL).  LIVER No results for input(s): AST, ALT, ALKPHOS, BILITOT, PROT, ALBUMIN, INR in the last 168 hours.   INFECTIOUS  Recent Labs Lab 06/09/16 0911 06/10/16 0500 06/11/16 0500  PROCALCITON 0.10 <0.10 0.17   ENDOCRINE CBG (last 3)   Recent Labs  06/13/16 2334 06/14/16 0306 06/14/16 0733  GLUCAP 126* 114* 115*    STUDIES:  ECHO 10/4 >> EF 55%, PAP 42  CULTURES: Trache 10/17 > Normal flora Blood 10/18 >  neg Trach 10/19 > normal flora  ANTIBIOTICS: Vanc 10/18 >> 10/22 Zosyn 10/18 >>  SIGNIFICANT EVENTS: 10/03  Admit 10/05  Transferred to ICU due to AMS, hypercarbic respiratory failure 10/06  Bipap, ntg gtt & BP remains elevated in 518'A systolic.  Net neg 5.5 L in last 24 hours with improvement in sr cr 10/07  intubated overnight .  10/07 -10/14 diuresed aggressively 10/14  Attempting wean 10/15  Weaning 10/18  febrile 10/21  Awake, following commands, PSV   LINES: ETT 10/7 >> 10/21 Trach 10/21 >> R IJ TLC 10/10 >>    DISCUSSION: [name] with PMH of super morbid obesity, HTN, pulmonary hypertension admitted with decompensated diastolic CHF.     PULMONARY A: Acute on Chronic Hypoxemic and Hypercarbic Respiratory Failure  ?HCAP - fevers, purulent secretions, sub-optimal  CXR r/t body habitus  Secondary pulmonary hypertension Severe OSA / OHS with acute decompensation  Tongue swelling - since 10/13 compromising airway. Resolved P:   Daily SBT with goal ATC  If de-cannulated, will need CPAP compliance Cont abx for now - consider d/c 10/24 for total 7 days   Gentle diuresis per cards - previously held r/t rising Scr/hypernatremia  O2 as needed to support sats > 88% Oral care Mobilize  If continues to tol ATC will need speech for PMV  CARDIOVASCULAR A:  Hypertensive Urgency > resolved Pulm edema/Volume overload > lost 47 kgs at her peak Acute diastolic heart failure Demand ischemia Hx HTN Long pauses on telemetry in evenings P:  Hydralazine 75 mg TID  B-blocker per cardiology Amlodipine per cardiology Goal SBP < 150   Tele monitoring Off diuretics since 10/18 -- restarted 10/23 per cards with net POS 1L  RENAL A:   AKI / Azotemia 2/2 diuresis - off diuretics since 10/18 Hypernatremia  Hypokalemia P:   Monitor BMET and UOP Replace electrolytes as indicated Free water 250 ml Q4 Add low dose D5W 10/23  Keep euvolemic KCL 40 mEQ BID   GASTROINTESTINAL A:   Morbid obesity Nutrition Diarrhea - mild  P:   TF per nutrition  Place cortrak for nutrition Outpatient obesity clinic referral Hold stool softeners   HEMATOLOGIC A:   VTE prophylaxis P:  SCD's / Lovenox for DVT prophylaxis CBC daily  INFECTIOUS A:   Possible hcap. Persistent fevers with inc purulent ET secretions - improved post trach  P:   ABX as above - consider d/c after dose 10/24 Follow cultures  Trend PCT > negative    ENDOCRINE A:   No acute issues P:   No interventions required  NEUROLOGIC A:   Acute encephalopathy - in the setting of hypercarbia.  Altered Mental Status - intermittent  sometimes she follows commands, sometimes she does not > meds related,  not sure if related to psych issues (pt with depression) Depression - family reports pt is  in "denial" - seen by Psych 05/28/16 P:   RASS goal: 0 Minimize sedating medications PT efforts OOB to chair    FAMILY  - Updates:  Patient updated 10/23 at bedside, no family available.   - Inter-disciplinary family meet or Palliative Care meeting due by: done     Nickolas Madrid, NP 06/14/2016  9:44 AM Pager: [phone] or [phone]   STAFF NOTE: I, Merrie Roof, MD FACP have personally reviewed patient's available data, including medical history, events of note, physical examination and test results as part of my evaluation. I have discussed with resident/NP and other care providers such as pharmacist, RN and RRT. In addition,  I personally evaluated patient and elicited key findings of: awake, glasses on , some secretions mild on trach , distant BS, less edema, Na 160, dc lasix, add d5w, re assess chem in pm, move to sdu, keep nocturnal vent, I updated pt  Lavon Paganini. Titus Mould, MD, [place] Pgr: [facility] 06/14/2016 12:23 PM

## 2016-06-14 NOTE — Progress Notes (Signed)
Spoke w dr Titus Mould. md says pt will need vent at nite . Spoke w pt and her mother. Mother agreeable to learning to handle vent at nite. Ref to [facility] to start home vent assessment.

## 2016-06-15 ENCOUNTER — Inpatient Hospital Stay (HOSPITAL_COMMUNITY): Payer: PRIVATE HEALTH INSURANCE

## 2016-06-15 DIAGNOSIS — J9611 Chronic respiratory failure with hypoxia: Secondary | ICD-10-CM

## 2016-06-15 DIAGNOSIS — J9612 Chronic respiratory failure with hypercapnia: Secondary | ICD-10-CM

## 2016-06-15 LAB — GLUCOSE, CAPILLARY
Glucose-Capillary: 102 mg/dL — ABNORMAL HIGH (ref 65–99)
Glucose-Capillary: 125 mg/dL — ABNORMAL HIGH (ref 65–99)
Glucose-Capillary: 127 mg/dL — ABNORMAL HIGH (ref 65–99)
Glucose-Capillary: 149 mg/dL — ABNORMAL HIGH (ref 65–99)

## 2016-06-15 LAB — CBC
HCT: 48.3 % — ABNORMAL HIGH (ref 36.0–46.0)
Hemoglobin: 13.8 g/dL (ref 12.0–15.0)
MCH: 24.3 pg — ABNORMAL LOW (ref 26.0–34.0)
MCHC: 28.6 g/dL — ABNORMAL LOW (ref 30.0–36.0)
MCV: 84.9 fL (ref 78.0–100.0)
Platelets: 163 10*3/uL (ref 150–400)
RBC: 5.69 MIL/uL — ABNORMAL HIGH (ref 3.87–5.11)
RDW: 23.2 % — ABNORMAL HIGH (ref 11.5–15.5)
WBC: 6.2 10*3/uL (ref 4.0–10.5)

## 2016-06-15 LAB — BASIC METABOLIC PANEL
Anion gap: 11 (ref 5–15)
BUN: 49 mg/dL — ABNORMAL HIGH (ref 6–20)
CO2: 31 mmol/L (ref 22–32)
Calcium: 9.7 mg/dL (ref 8.9–10.3)
Chloride: 112 mmol/L — ABNORMAL HIGH (ref 101–111)
Creatinine, Ser: 1.14 mg/dL — ABNORMAL HIGH (ref 0.44–1.00)
GFR calc Af Amer: >60 mL/min (ref >60)
GFR calc non Af Amer: >60 mL/min (ref >60)
Glucose, Bld: 127 mg/dL — ABNORMAL HIGH (ref 65–99)
Potassium: 3.9 mmol/L (ref 3.5–5.1)
Sodium: 154 mmol/L — ABNORMAL HIGH (ref 135–145)

## 2016-06-15 LAB — PHOSPHORUS: Phosphorus: 4.9 mg/dL — ABNORMAL HIGH (ref 2.5–4.6)

## 2016-06-15 LAB — MAGNESIUM: Magnesium: 2.3 mg/dL (ref 1.7–2.4)

## 2016-06-15 MED ORDER — ACETAMINOPHEN 160 MG/5ML PO SOLN
650.0000 mg | Freq: Four times a day (QID) | ORAL | Status: DC | PRN
  Administered 2016-06-19: 650 mg
  Filled 2016-06-15: qty 20.3

## 2016-06-15 MED ORDER — FUROSEMIDE 10 MG/ML IJ SOLN
60.0000 mg | Freq: Every day | INTRAMUSCULAR | Status: DC
  Administered 2016-06-16 – 2016-06-17 (×2): 60 mg via INTRAVENOUS
  Filled 2016-06-15 (×2): qty 6

## 2016-06-15 MED ORDER — ENOXAPARIN SODIUM 80 MG/0.8ML ~~LOC~~ SOLN
65.0000 mg | SUBCUTANEOUS | Status: DC
  Administered 2016-06-16: 65 mg via SUBCUTANEOUS
  Filled 2016-06-15: qty 0.8

## 2016-06-15 MED ORDER — ISOSORBIDE DINITRATE 30 MG PO TABS
30.0000 mg | ORAL_TABLET | Freq: Two times a day (BID) | ORAL | Status: DC
  Administered 2016-06-16 – 2016-06-28 (×26): 30 mg
  Filled 2016-06-15 (×27): qty 1

## 2016-06-15 NOTE — Progress Notes (Signed)
Occupational Therapy Treatment Patient Details Name: [name] [name] MRN: [id number] DOB: 13-Nov-1980 Today's Date: 06/15/2016    History of present illness [name] with PMH of morbid obesity (469 lbs), DM, HTN and tachycardia who presented to [facility] on 10/3 with reports of abdominal bloating and heavy abdominal pressure.  Intubated 05/29/16 to present, trached 06/10/16.   OT comments  Worked in standing with Vital Go bed, bed mobility, sitting EOB and attempted sit to stand x 2. Pt now able to swab her mouth with her L hand and can reach her face with B UEs to remove her glasses and wipe nose. Pt with decreased awareness of safety and limitations. Pt attempting to use her cell phone brought in by her mother. Progressing well.  Follow Up Recommendations  LTACH;Supervision/Assistance - 24 hour    Equipment Recommendations       Recommendations for Other Services      Precautions / Restrictions Precautions Precautions: Fall Precaution Comments: 40% trach collar Restrictions Weight Bearing Restrictions: No       Mobility Bed Mobility Overal bed mobility: Needs Assistance Bed Mobility: Supine to Sit;Sit to Supine     Supine to sit: Mod assist;+2 for physical assistance Sit to supine: Total assist;+2 for physical assistance   General bed mobility comments: Moved pt up in bed with total assist.  Used VitalGo total lift bed to progressively incline pt to full standing position.  Once pt in full standing adjusted straps so that pt could perform squats with pt using RW for stability.  Pt performed 10-15 squats.  shifting weight as well.  Pt at times leaning chest forward and needing assist to lean back against bed.  Pt wanted to try and sit EOB therefore laid pt back down and pt then was assisted to EOB with assist as above.  Pt fatigued and needed total assist of 3 when she returned to supine.  Transfers Overall transfer level: Needs assistance Equipment used: Rolling walker (2  wheeled) Transfers: Sit to/from Stand Sit to Stand: +2 physical assistance;From elevated surface;Max assist;Total assist         General transfer comment: Used Vital Go lift bed to get pt in standing with pt supported by bed and straps. Stood a total of 15 minutes with bed in full upright position.   Once pt stood, wanted to try to stand from EOB.  Pt did attempt standing x 2 at EOB but could not get LEs to extend enough to support her weight as well as was not using UEs as much as she needed to.      Balance Overall balance assessment: Needs assistance Sitting-balance support: Feet supported Sitting balance-Leahy Scale: Fair Sitting balance - Comments: needed UE support initially but was able to get balance at EOB and sat with  min guard assist for 10 minutes.    Standing balance support: Bilateral upper extremity supported;During functional activity Standing balance-Leahy Scale: Zero Standing balance comment: Vital Go lift bed at full standing position for 15 minutes. Pt with support of straps but straps loosened so pt could perform squats.                   ADL Overall ADL's : Needs assistance/impaired     Grooming: Oral care;Bed level Grooming Details (indicate cue type and reason): pt able to swab mouth with L hand  General ADL Comments: Pt consistently able to perform L and R hand to face to remove glasses, wipe nose.      Vision                     Perception     Praxis      Cognition   Behavior During Therapy: WFL for tasks assessed/performed Overall Cognitive Status: Impaired/Different from baseline Area of Impairment: Attention Orientation Level: Disoriented to;Time;Situation Current Attention Level: Sustained Memory: Decreased recall of precautions  Following Commands: Follows one step commands consistently Safety/Judgement: Decreased awareness of safety;Decreased awareness of deficits   Problem  Solving: Requires verbal cues;Requires tactile cues;Difficulty sequencing General Comments: Pt much more animated and indicating reliable yes/no. More focused today in treatment.  Pt however is less aware of deficits.  Thinks she can get OOb without assist and stand and is still very weak and deconditioned.    Extremity/Trunk Assessment               Exercises    Shoulder Instructions       General Comments      Pertinent Vitals/ Pain       Pain Assessment: No/denies pain  Home Living                                          Prior Functioning/Environment              Frequency  Min 2X/week        Progress Toward Goals  OT Goals(current goals can now be found in the care plan section)  Progress towards OT goals: Progressing toward goals;Goals met and updated - see care plan  Acute Rehab OT Goals Patient Stated Goal: to get OOB OT Goal Formulation: With patient Time For Goal Achievement: 06/23/16 Potential to Achieve Goals: Good  Plan Frequency remains appropriate    Co-evaluation    PT/OT/SLP Co-Evaluation/Treatment: Yes Reason for Co-Treatment: For patient/therapist safety PT goals addressed during session: Mobility/safety with mobility        End of Session     Activity Tolerance Patient tolerated treatment well   Patient Left in bed;with call bell/phone within reach;with family/visitor present;with bed alarm set;with nursing/sitter in room   Nurse Communication Mobility status        Time: 6047-9987 OT Time Calculation (min): 63 min  Charges: OT General Charges $OT Visit: 1 Procedure OT Treatments $Therapeutic Activity: 23-37 mins  Malka So 06/15/2016, 1:46 PM  [phone]

## 2016-06-15 NOTE — Progress Notes (Signed)
Patient Name: [name] [name] Date of Encounter: 06/15/2016  Primary Cardiologist: [facility] Problem List     Principal Problem:   Acute congestive heart failure [facility]) Active Problems:   Malignant hypertension   Morbid obesity (HCC)   OSA (obstructive sleep apnea)   Renal insufficiency   Acute pulmonary edema (HCC)   Abdominal pain   Hepatic congestion   Sinus tachycardia   Pericardial effusion   Pulmonary hypertension   Acute respiratory failure with hypoxemia (HCC)   Difficult intravenous access   Sinus pause   HCAP (healthcare-associated pneumonia)   Respiratory failure (HCC)     Subjective   BP and HR improved overnight with restarting labetalol. Diuresed net negative 3.3L yesterday.   Inpatient Medications    Scheduled Meds: . amLODipine  10 mg Per Tube Daily  . chlorhexidine gluconate (MEDLINE KIT)  15 mL Mouth Rinse BID  . enoxaparin (LOVENOX) injection  65 mg Subcutaneous Q24H  . famotidine (PEPCID) IV  20 mg Intravenous Q12H  . feeding supplement (PRO-STAT SUGAR FREE 64)  60 mL Per Tube TID  . feeding supplement (VITAL HIGH PROTEIN)  1,000 mL Per Tube Q24H  . free water  250 mL Per Tube Q4H  . furosemide  60 mg Intravenous BID  . hydrALAZINE  75 mg Oral Q8H  . insulin aspart  2-6 Units Subcutaneous Q4H  . isosorbide dinitrate  30 mg Oral BID  . labetalol  200 mg Per Tube BID  . mouth rinse  15 mL Mouth Rinse Q4H  . piperacillin-tazobactam (ZOSYN)  IV  3.375 g Intravenous Q8H  . potassium chloride  40 mEq Per Tube BID  . sodium chloride flush  10-40 mL Intracatheter Q12H  . sodium chloride flush  10-40 mL Intracatheter Q12H  . sodium chloride flush  3 mL Intravenous Q12H   Continuous Infusions: . dextrose 30 mL/hr at 06/15/16 0700   PRN Meds: acetaminophen (TYLENOL) oral liquid 160 mg/5 mL, fentaNYL (SUBLIMAZE) injection, hydrALAZINE, ondansetron **OR** ondansetron (ZOFRAN) IV, sodium chloride flush, sodium chloride flush, white petrolatum    Vital Signs    Vitals:   06/15/16 0700 06/15/16 0800 06/15/16 0900 06/15/16 0907  BP:  (!) 141/89 (!) 147/94 (!) 147/94  Pulse: 91 95 90 92  Resp: 19 (!) 21 (!) 22 18  Temp:  98.3 F (36.8 C)    TempSrc:  Oral    SpO2: 96% 96% 97% 98%  Weight:      Height:        Intake/Output Summary (Last 24 hours) at 06/15/16 0908 Last data filed at 06/15/16 0900  Gross per 24 hour  Intake           2300.5 ml  Output             5525 ml  Net          -3224.5 ml   Filed Weights   06/13/16 1100 06/14/16 0500 06/15/16 0500  Weight: 276 lb (125.2 kg) 285 lb (129.3 kg) 286 lb 8 oz (130 kg)    Physical Exam   GEN: Well nourished, morbidly obese, well developed, in no acute distress, on trach collar HEENT: Grossly normal.  Neck: Supple, thick neck, carotid bruits, or masses. Cardiac: RRR, no murmurs, rubs, or gallops. No clubbing, cyanosis, edema.  Radials/DP/PT 2+ and equal bilaterally.  Respiratory:  Respirations regular and unlabored, clear to auscultation bilaterally. GI: Soft, nontender, nondistended, BS + x 4. MS: no deformity or atrophy. Skin: warm and dry, no  rash. Neuro:  Strength and sensation are intact. Psych: AAOx3.  Normal affect.  Labs    CBC  Recent Labs  06/14/16 0300 06/15/16 0520  WBC 6.5 6.2  HGB 14.0 13.8  HCT 49.4* 48.3*  MCV 84.9 84.9  PLT 180 PENDING   Basic Metabolic Panel  Recent Labs  06/14/16 0300 06/15/16 0520  NA 160* 154*  K 3.8 3.9  CL 120* 112*  CO2 31 31  GLUCOSE 116* 127*  BUN 48* 49*  CREATININE 0.88 1.14*  CALCIUM 9.9 9.7  MG  --  2.3  PHOS  --  4.9*   Liver Function Tests No results for input(s): AST, ALT, ALKPHOS, BILITOT, PROT, ALBUMIN in the last 72 hours. No results for input(s): LIPASE, AMYLASE in the last 72 hours. Cardiac Enzymes No results for input(s): CKTOTAL, CKMB, CKMBINDEX, TROPONINI in the last 72 hours. BNP Invalid input(s): POCBNP D-Dimer No results for input(s): DDIMER in the last 72  hours. Hemoglobin A1C No results for input(s): HGBA1C in the last 72 hours. Fasting Lipid Panel No results for input(s): CHOL, HDL, LDLCALC, TRIG, CHOLHDL, LDLDIRECT in the last 72 hours. Thyroid Function Tests No results for input(s): TSH, T4TOTAL, T3FREE, THYROIDAB in the last 72 hours.  Invalid input(s): FREET3  Telemetry    Sinus tach - Personally Reviewed  ECG    None recent  Radiology    Dg Chest Port 1 View  Result Date: 06/14/2016 CLINICAL DATA:  Respiratory failure. EXAM: PORTABLE CHEST 1 VIEW COMPARISON:  06/07/2016. FINDINGS: Tracheostomy tube and NG tube in stable position. Right IJ line in stable position. Cardiomegaly with pulmonary venous congestion in mild bilateral from interstitial prominence again noted. Findings consistent with congestive heart failure . IMPRESSION: 1. Lines and tubes in stable position. 2. Congestive heart failure with mild pulmonary interstitial edema. Similar findings on prior exam . Electronically Signed   By: [place]   On: 06/14/2016 07:30   Dg Abd Portable 1v  Result Date: 06/13/2016 CLINICAL DATA:  35 year old female status post nasogastric tube placement. EXAM: PORTABLE ABDOMEN - 1 VIEW COMPARISON:  Abdominal radiograph 06/12/2016. FINDINGS: Nasogastric tube coiled in the stomach with tip in the fundus. Gas is noted throughout portions of the colon and small bowel. No pathologic dilatation of small bowel. Some distal rectal gas is noted. No gross evidence of pneumoperitoneum. IUD projecting over the central pelvis. IMPRESSION: 1. Nasogastric tube is in the stomach with the tip directed retrograde toward the fundus. 2. Nonobstructive bowel gas pattern. Electronically Signed   By: Vinnie Langton M.D.   On: 06/13/2016 15:22    Cardiac Studies   EF 50-55%  Patient Profile     35 y/o with diastolic heart failure  Assessment & Plan    Acute diastolic HF:  - ECHO with normal EF, mild pulm HTN.   - Stopped lasix 10/18,  secondary to BUN and creat elevated. BUN / Cr back to normal.   - CVP 8 two days ago.  CVP  5 Mm Hg yesterday. + 2Liters since yesterday. CVP currently unavailable.  Unable to get an accurate weight today as the bed was zeroed with the patient in the bed.  Bed showing 7 kg, indicating that her weight may be up 7 kg.  Unsure if that is accurate.  O2 sats are unchanged. Continue to hold diuretics.  - Net negative 3L yesterday - small bump in creatinine, Decrease lasix to 60 mg IV daily.  Hypertension: remains hypertensive and tachycardic. On labetalol 200  mg BID, hydralazine and amlodipine.  Pixie Casino, MD, [facility] Attending Cardiologist [place], MD  06/15/2016, 9:08 AM

## 2016-06-15 NOTE — Progress Notes (Addendum)
Pharmacy Antibiotic Note  [name] [name] is a 35 y.o. female admitted on 05/25/2016 with pneumonia.  Pharmacy has been consulted for  Zosyn dosing. -Day # 7  Zosyn. Cultures negative , afebrile, WBC 6.2 and CrCl ~ 90.    Plan: -Continue Zosyn 3.375 gm IV q8hrs (noted plans for possible antibiotic discontinuation today) -Will adjust lovenox to 79m sq q24hr for weight of 130kg (0.547mkg/day due to obesity) -Will sign off. Please contact pharmacy with any other needs.   Height: 5' 3" (160 cm) Weight: 286 lb 8 oz (130 kg) IBW/kg (Calculated) : 52.4  Temp (24hrs), Avg:98.4 F (36.9 C), Min:97.9 F (36.6 C), Max:98.8 F (37.1 C)   Recent Labs Lab 06/10/16 0500 06/11/16 0500 06/12/16 0501 06/14/16 0300 06/15/16 0520  WBC 6.8 7.5 7.6 6.5 6.2  CREATININE 1.24* 1.12* 0.93 0.88 1.14*    Estimated Creatinine Clearance: 89.8 mL/min (by C-G formula based on SCr of 1.14 mg/dL (H)).    Allergies  Allergen Reactions  . No Known Allergies     Antimicrobials this admission: Flucon oral for thrush 10/5>>10/13 Zosyn 10/18>> Vanc 10/18>> 10/22  Dose adjustments this admission:  n/a  Microbiology results: 10/5 MRSA PCR: neg 10/17 TA: normal respiratory flora 10/18 BCx: neg 10/19 TA - normal respiratory flora  Thank you for allowing pharmacy to be a part of this patient's care. AnHildred LaserPharm D 06/15/2016 8:18 AM

## 2016-06-15 NOTE — Progress Notes (Signed)
[place] TEAM 1 - Stepdown/ICU TEAM  [name] [name]  FVC:[id number] DOB: [birth year] DOA: 05/25/2016 PCP: No PCP Per Patient    Brief Narrative:  [name] with Hx of morbid obesity (469 lbs), DM, HTN and tachycardia who presented to [facility] on 10/3 with acute on chronic respiratory failure with hypercarbia and hypoxemia due to diastolic CHF exacerbation and obesity hypoventilation syndrome.  Ultimately required tracheostomy due to prolonged respiratory failure. Diuresed over 100 lbs.   Significant Events: 10/03  Admit 10/05  Transferred to ICU due to AMS, hypercarbic respiratory failure 10/06  Bipap, ntg gtt & BP remains elevated in 638'G systolic.  Net neg 5.5 L in last 24 hours with improvement in sr cr 10/07  intubated overnight .  10/07 -10/14 diuresed aggressively 10/14  Attempting wean 10/15  Weaning 10/18  febrile 10/21  Awake, following commands, PSV   Subjective: Alert and interactive.  Denies cp, sob, n/v, or abdom pain.  Is pleasant and appears comfortable.    Assessment & Plan:  Acute on Chronic Hypoxemic and Hypercarbic Respiratory Failure  S/p tracheostomy - ongoing vent care per PCCM   Acute diastolic heart failure Net negative ~22.5L since admit - diuretic being dosed by Cards  Filed Weights   06/13/16 1100 06/14/16 0500 06/15/16 0500  Weight: 125.2 kg (276 lb) 129.3 kg (285 lb) 130 kg (286 lb 8 oz)    Secondary pulmonary hypertension As per CHF tx plan   Hypertensive Urgency Resolved - BP currently well controlled - follow trend   Severe OSA / OHS with acute decompensation  To continue QHS vent support - PCCM directing care   AKI / Azotemia 2/2 diuresis Diuretic to resume in AM - follow trend   ?HCAP fevers, purulent secretions, sub-optimal CXR r/t body habitus - completed 6 days of empiric abx tx    Demand ischemia Cards following   Hypernatremia  Improving w/ holding of lasix - follow   Hypokalemia Corrected  Morbid obesity - Body mass index  is 50.75 kg/m.  Altered Mental Status Appears resolved at this time w/ pt alert and cheerful - follow   Depression family reports pt is in "denial" - seen by Psych 05/28/16  DVT prophylaxis: lovenox  Code Status: FULL CODE Family Communication: spoke w/ mother at bedside  Disposition Plan: SDU  Consultants:  PCCM Cardiology   Antimicrobials:  Vanc 10/18 - 10/22 Zosyn 10/18 - 10/23  Objective: Blood pressure (!) 147/94, pulse (!) 105, temperature 98.3 F (36.8 C), temperature source Oral, resp. rate (!) 21, height _0  (1.6 m), weight 130 kg (286 lb 8 oz), SpO2 93 %, unknown if currently breastfeeding.  Intake/Output Summary (Last 24 hours) at 06/15/16 1030 Last data filed at 06/15/16 1000  Gross per 24 hour  Intake           2255.5 ml  Output             4400 ml  Net          -2144.5 ml   Filed Weights   06/13/16 1100 06/14/16 0500 06/15/16 0500  Weight: 125.2 kg (276 lb) 129.3 kg (285 lb) 130 kg (286 lb 8 oz)    Examination: General: No acute respiratory distress on vent via trach  Lungs: distant breath sounds th/o - poor air movement B bases  Cardiovascular: Regular rate and rhythm without murmur gallop or rub  Abdomen: Nontender, morbidly obese, soft, bowel sounds positive, no rebound, no ascites, no appreciable mass Extremities: No significant  cyanosis, or clubbing - 2+ edema bilateral lower extremities w/ chronic venous stasis dermatitis   CBC:  Recent Labs Lab 06/10/16 0500 06/11/16 0500 06/12/16 0501 06/14/16 0300 06/15/16 0520  WBC 6.8 7.5 7.6 6.5 6.2  HGB 13.7 14.1 14.7 14.0 13.8  HCT 47.9* 48.9* 51.3* 49.4* 48.3*  MCV 83.9 85.5 85.1 84.9 84.9  PLT 230 170 159 180 021   Basic Metabolic Panel:  Recent Labs Lab 06/09/16 0458 06/10/16 0500 06/11/16 0500 06/12/16 0501 06/14/16 0300 06/15/16 0520  NA 149* 153* 157* 157* 160* 154*  K 4.4 4.2 3.7 3.2* 3.8 3.9  CL 100* 109 113* 117* 120* 112*  CO2 38* 36* 35* 32 31 31  GLUCOSE 190* 135* 130*  125* 116* 127*  BUN 83* 90* 72* 50* 48* 49*  CREATININE 1.30* 1.24* 1.12* 0.93 0.88 1.14*  CALCIUM 10.2 9.8 9.6 10.0 9.9 9.7  MG 2.8* 2.6* 2.7* 2.4  --  2.3  PHOS 5.2* 4.3 4.5 2.6  --  4.9*   GFR: Estimated Creatinine Clearance: 89.8 mL/min (by C-G formula based on SCr of 1.14 mg/dL (H)).  Liver Function Tests: No results for input(s): AST, ALT, ALKPHOS, BILITOT, PROT, ALBUMIN in the last 168 hours. No results for input(s): LIPASE, AMYLASE in the last 168 hours.  Recent Labs Lab 06/08/16 1208  AMMONIA 30    Cardiac Enzymes: No results for input(s): CKTOTAL, CKMB, CKMBINDEX, TROPONINI in the last 168 hours.  HbA1C: Hgb A1c MFr Bld  Date/Time Value Ref Range Status  05/25/2016 09:48 PM 7.9 (H) 4.8 - 5.6 % Final    Comment:    (NOTE)         Pre-diabetes: 5.7 - 6.4         Diabetes: >6.4         Glycemic control for adults with diabetes: <7.0     CBG:  Recent Labs Lab 06/14/16 1606 06/14/16 2035 06/14/16 2339 06/15/16 0522 06/15/16 0823  GLUCAP 111* 128* 124* 127* 149*    Recent Results (from the past 240 hour(s))  Culture, respiratory (NON-Expectorated)     Status: None   Collection Time: 06/08/16 11:05 AM  Result Value Ref Range Status   Specimen Description TRACHEAL ASPIRATE  Final   Special Requests Normal  Final   Gram Stain   Final    MODERATE WBC PRESENT,BOTH PMN AND MONONUCLEAR MODERATE GRAM VARIABLE ROD FEW GRAM POSITIVE COCCI IN PAIRS    Culture Consistent with normal respiratory flora.  Final   Report Status 06/10/2016 FINAL  Final  Culture, blood (routine x 2)     Status: None   Collection Time: 06/09/16 10:30 AM  Result Value Ref Range Status   Specimen Description BLOOD LEFT HAND  Final   Special Requests BOTTLES DRAWN AEROBIC ONLY 10 CC  Final   Culture NO GROWTH 5 DAYS  Final   Report Status 06/14/2016 FINAL  Final  Culture, blood (routine x 2)     Status: None   Collection Time: 06/09/16 10:45 AM  Result Value Ref Range Status    Specimen Description BLOOD RIGHT HAND  Final   Special Requests BOTTLES DRAWN AEROBIC ONLY 10 CC  Final   Culture NO GROWTH 5 DAYS  Final   Report Status 06/14/2016 FINAL  Final  Culture, respiratory (NON-Expectorated)     Status: None   Collection Time: 06/10/16  9:32 AM  Result Value Ref Range Status   Specimen Description TRACHEAL ASPIRATE  Final   Special Requests NONE  Final  Gram Stain   Final    ABUNDANT WBC PRESENT, PREDOMINANTLY PMN RARE GRAM POSITIVE COCCI IN PAIRS RARE GRAM POSITIVE RODS    Culture Consistent with normal respiratory flora.  Final   Report Status 06/12/2016 FINAL  Final     Scheduled Meds: . amLODipine  10 mg Per Tube Daily  . chlorhexidine gluconate (MEDLINE KIT)  15 mL Mouth Rinse BID  . enoxaparin (LOVENOX) injection  65 mg Subcutaneous Q24H  . famotidine (PEPCID) IV  20 mg Intravenous Q12H  . feeding supplement (PRO-STAT SUGAR FREE 64)  60 mL Per Tube TID  . feeding supplement (VITAL HIGH PROTEIN)  1,000 mL Per Tube Q24H  . free water  250 mL Per Tube Q4H  . [START ON 06/16/2016] furosemide  60 mg Intravenous Daily  . hydrALAZINE  75 mg Oral Q8H  . insulin aspart  2-6 Units Subcutaneous Q4H  . isosorbide dinitrate  30 mg Oral BID  . labetalol  200 mg Per Tube BID  . mouth rinse  15 mL Mouth Rinse Q4H  . potassium chloride  40 mEq Per Tube BID  . sodium chloride flush  10-40 mL Intracatheter Q12H  . sodium chloride flush  10-40 mL Intracatheter Q12H  . sodium chloride flush  3 mL Intravenous Q12H   Continuous Infusions: . dextrose 30 mL/hr at 06/15/16 0700     LOS: 21 days   Cherene Altes, MD [facility] Office  [phone] Pager - Text Page per Shea Evans as per below:  On-Call/Text Page:      [email or web address]      password [email or web address]  If 7PM-7AM, please contact night-coverage [email or web address] Password [email or web address] 06/15/2016, 10:30 AM

## 2016-06-15 NOTE — Progress Notes (Addendum)
Physical Therapy Treatment Patient Details Name: [name] [name] MRN: [id number] DOB: [birth year] Today's Date: 06/15/2016    History of Present Illness [name] with PMH of morbid obesity (469 lbs), DM, HTN and tachycardia who presented to [facility] on 10/3 with reports of abdominal bloating and heavy abdominal pressure.  Intubated 05/29/16 to present, trached 06/10/16.    PT Comments    Pt admitted with above diagnosis. Pt currently with functional limitations due to balance and endurance deficits. Pt was able to stand in Vital Go lift bed at full standing and perform squats again.  Attempts to stand to RW unsuccessful but pt may have been fatigued as she stood in the tilt bed first.  Will continue PT.  Pt will benefit from skilled PT to increase their independence and safety with mobility to allow discharge to the venue listed below.    Follow Up Recommendations  LTACH;Supervision/Assistance - 24 hour     Equipment Recommendations  Other (comment) (TBA)    Recommendations for Other Services       Precautions / Restrictions Precautions Precautions: Fall Precaution Comments: 40% trach collar Restrictions Weight Bearing Restrictions: No    Mobility  Bed Mobility Overal bed mobility: Needs Assistance Bed Mobility: Supine to Sit;Sit to Supine     Supine to sit: Mod assist;+2 for physical assistance Sit to supine: Total assist;+2 for physical assistance   General bed mobility comments: Moved pt up in bed with total assist.  Used VitalGo total lift bed to progressively incline pt to full standing position.  Once pt in full standing adjusted straps so that pt could perform squats with pt using RW for stability.  Pt performed 10-15 squats.  shifting weight as well.  Pt at times leaning chest forward and needing assist to lean back against bed.  Pt wanted to try and sit EOB therefore laid pt back down and pt then was assisted to EOB with assist as above.  Pt fatigued and needed total assist of  3 when she returned to supine.  Transfers Overall transfer level: Needs assistance Equipment used: Rolling walker (2 wheeled) Transfers: Sit to/from Stand Sit to Stand: +2 physical assistance;From elevated surface;Max assist;Total assist         General transfer comment: Used Vital Go lift bed to get pt in standing with pt supported by bed and straps. Stood a total of 15 minutes with bed in full upright position.   Once pt stood, wanted to try to stand from EOB.  Pt did attempt standing x 2 at EOB but could not get LEs to extend enough to support her weight as well as was not using UEs as much as she needed to.    Ambulation/Gait                 Stairs            Wheelchair Mobility    Modified Rankin (Stroke Patients Only)       Balance Overall balance assessment: Needs assistance Sitting-balance support: No upper extremity supported;Feet supported Sitting balance-Leahy Scale: Fair Sitting balance - Comments: needed UE support initially but was able to get balance at EOB and sat with  min guard assist for 10 minutes.    Standing balance support: Bilateral upper extremity supported;During functional activity Standing balance-Leahy Scale: Zero Standing balance comment: Vital Go lift bed at full standing position for 15 minutes. Pt with support of straps but straps loosened so pt could perform squats.  Cognition Arousal/Alertness: Awake/alert Behavior During Therapy: WFL for tasks assessed/performed Overall Cognitive Status: Impaired/Different from baseline Area of Impairment: Attention Orientation Level: Disoriented to;Time;Situation Current Attention Level: Sustained Memory: Decreased recall of precautions Following Commands: Follows one step commands consistently;Follows multi-step commands inconsistently Safety/Judgement: Decreased awareness of safety;Decreased awareness of deficits   Problem Solving: Requires verbal cues;Requires  tactile cues;Difficulty sequencing General Comments: Pt much more animated and indicating reliable yes/no. More focused today in treatment.  Pt however is less aware of deficits.  Thinks she can get OOb without assist and stand and is still very weak and deconditioned.    Exercises General Exercises - Lower Extremity Ankle Circles/Pumps: AROM;Both;5 reps;Supine Quad Sets: AROM;Both;10 reps;Supine Gluteal Sets: AROM;Both;5 reps;Supine Mini-Sqauts: AROM;Both;10 reps;Standing    General Comments General comments (skin integrity, edema, etc.): Pt on 40% trach collar with sats >90% most of time.  HR and BP stable      Pertinent Vitals/Pain Pain Assessment: No/denies pain  VSS on 40% trach collar    Home Living                      Prior Function            PT Goals (current goals can now be found in the care plan section) Progress towards PT goals: Progressing toward goals    Frequency    Min 3X/week      PT Plan Current plan remains appropriate    Co-evaluation PT/OT/SLP Co-Evaluation/Treatment: Yes Reason for Co-Treatment: For patient/therapist safety PT goals addressed during session: Mobility/safety with mobility       End of Session Equipment Utilized During Treatment: Oxygen (trach collar 40%) Activity Tolerance: Patient limited by fatigue Patient left: in bed;with call bell/phone within reach;with bed alarm set;with family/visitor present (in chair position at 50 degrees)     Time: 5597-4163 PT Time Calculation (min) (ACUTE ONLY): 62 min  Charges:  $Therapeutic Activity: 23-37 mins                    G CodesIrwin Brakeman F 07/04/2016, 11:46 AM [facility] [phone] (pager)

## 2016-06-15 NOTE — Progress Notes (Signed)
PULMONARY / CRITICAL CARE MEDICINE   Name: [name] [name] MRN: [id number] DOB: 1980/09/16    ADMISSION DATE:  05/25/2016 CONSULTATION DATE:  05/27/16  REFERRING MD:  Dr. Ree Kida - [facility]  CHIEF COMPLAINT:  Altered Mental Status   BRIEF: [name] with PMH of morbid obesity (469 lbs), DM, HTN and tachycardia who presented to [facility] on 10/3 with acute on chronic respiratory failure with hypercarbia and hypoxemia due to diastolic CHF exacerbation and obesity hypoventilation syndrome.  Ultimately required tracheostomy due to prolonged respiratory failure. Diuresed over 100 lbs.   SUBJECTIVE:  No acute events overnight. Patient did go on ventilator overnight for rest. Continues to have moderate amount of secretions. Patient denying any difficulty breathing currently on tracheostomy collar or chest discomfort.  REVIEW OF SYSTEMS: Unable to obtain given tracheostomy collar.  VITAL SIGNS: BP (!) 141/89 (BP Location: Left Arm)   Pulse 95   Temp 98.3 F (36.8 C) (Oral)   Resp (!) 21   Ht _0  (1.6 m)   Wt 286 lb 8 oz (130 kg)   LMP  (LMP Unknown)   SpO2 96%   BMI 50.75 kg/m   HEMODYNAMICS:    VENTILATOR SETTINGS: Vent Mode: PRVC FiO2 (%):  [40 %] 40 % Set Rate:  [16 bmp] 16 bmp Vt Set:  [420 mL] 420 mL PEEP:  [5 cmH20] 5 cmH20 Plateau Pressure:  [19 cmH20-22 cmH20] 19 cmH20  INTAKE / OUTPUT: I/O last 3 completed shifts: In: 3265.5 [I.V.:605.5; NG/GT:2310; IV Piggyback:350] Out: 6100 [Urine:5750; Stool:350]  PHYSICAL EXAMINATION: General: Morbidly obese. No distress. Sitting up in bed. HENT: Tracheostomy in place. Moist mucous membranes. No scleral icterus. PULM:   Course referred breath sounds. Normal work of breathing on tracheostomy collar. Symmetric chest wall expansion.  CV: Regular rate. No appreciable JVD given body habitus. GI:  Soft. Protuberant. Normal bowel sounds. Derm: Warm & dry. No rash on exposed skin. Neuro: Awake. Following commands. Grossly nonfocal & following  commands.   LABS: PULMONARY  Recent Labs Lab 06/08/16 1030  PHART 7.306*  PCO2ART 86.1*  PO2ART 80.3*  HCO3 41.7*  O2SAT 93.7   CBC  Recent Labs Lab 06/12/16 0501 06/14/16 0300 06/15/16 0520  HGB 14.7 14.0 13.8  HCT 51.3* 49.4* 48.3*  WBC 7.6 6.5 6.2  PLT 159 180 PENDING   COAGULATION No results for input(s): INR in the last 168 hours.   CARDIAC   No results for input(s): TROPONINI in the last 168 hours. No results for input(s): PROBNP in the last 168 hours.  CHEMISTRY  Recent Labs Lab 06/09/16 0458 06/10/16 0500 06/11/16 0500 06/12/16 0501 06/14/16 0300 06/15/16 0520  NA 149* 153* 157* 157* 160* 154*  K 4.4 4.2 3.7 3.2* 3.8 3.9  CL 100* 109 113* 117* 120* 112*  CO2 38* 36* 35* 32 31 31  GLUCOSE 190* 135* 130* 125* 116* 127*  BUN 83* 90* 72* 50* 48* 49*  CREATININE 1.30* 1.24* 1.12* 0.93 0.88 1.14*  CALCIUM 10.2 9.8 9.6 10.0 9.9 9.7  MG 2.8* 2.6* 2.7* 2.4  --  2.3  PHOS 5.2* 4.3 4.5 2.6  --  4.9*   Estimated Creatinine Clearance: 89.8 mL/min (by C-G formula based on SCr of 1.14 mg/dL (H)).  LIVER No results for input(s): AST, ALT, ALKPHOS, BILITOT, PROT, ALBUMIN, INR in the last 168 hours.   INFECTIOUS  Recent Labs Lab 06/09/16 0911 06/10/16 0500 06/11/16 0500  PROCALCITON 0.10 <0.10 0.17   ENDOCRINE CBG (last 3)   Recent Labs  06/14/16 2339 06/15/16 0522 06/15/16 0823  GLUCAP 124* 127* 149*    STUDIES:  TTE 10/14: EF 55%, PAP 42  MICROBIOLOGY: MRSA PCR 10/5:  Negative Tracheal Asp Ctx 10/17:  Oral Flora Blood Ctx x2 10/18:  Negative Tracheal Asp Ctx 10/19:  Oral Flora  ANTIBIOTICS: Vanc 10/18 - 10/22 Zosyn 10/18 - 10/24  SIGNIFICANT EVENTS: 10/03  Admit 10/05  Transferred to ICU due to AMS, hypercarbic respiratory failure 10/06  Bipap, ntg gtt & BP remains elevated in 376'E systolic.  Net neg 5.5 L in last 24 hours with improvement in sr cr 10/07  intubated overnight .  10/07 -10/14 diuresed aggressively 10/14   Attempting wean 10/15  Weaning 10/18  febrile 10/21  Awake, following commands, PSV   LINES: ETT 10/7 - 10/21 Trach  #6 10/21 >> R IJ TLC 10/10 >>  NGT R 10/21 >> Foley 10/7 >> PIV x1  ASSESSMENT/PLAN:  35 y.o. with PMH of super morbid obesity, HTN, pulmonary hypertension admitted with decompensated diastolic CHF.  Patient tolerating long periods of tracheostomy collar. Secretions are slowly improving. Discussed nocturnal ventilator use with respiratory therapy and nurse at bedside. Holding on [facility] valve/speech consult at this time given continued secretions.   1. Acute on Chronic Hypoxic & Hypercarbic Respiratory Failure - S/P tracheostomy. Continuing intermittent tracheostomy collar trials and full ventilator support for increased work of breathing/rest. Weaning for Goal Sat 88-94%. 2. Pulmonary Hypertension:  Multifactorial. Gentle intermittent diuresis per cardiology. 3. Severe OSA/OHS:  Recommend continued nocturnal ventilator. If decanulated would need to ensure NIPPV Compliance. 4. Possible HCAP:  Discontinuing Zosyn after 6 days of tx. No evidence of infection on cultures.   Remainder of care as per primary service.  Sonia Baller Ashok Cordia, M.D. [facility] Pulmonary & Critical Care Pager:  [phone] After 3pm or if no response, call [phone] 06/15/2016 9:07 AM

## 2016-06-15 NOTE — Progress Notes (Signed)
Report given to 4e nurse at this time

## 2016-06-15 NOTE — Progress Notes (Signed)
Pt pulled out NG tube 1 hr after arrival to unit. NP made aware that pt pulled out but had swallow eval tomorrow to assess pts diet.

## 2016-06-16 ENCOUNTER — Inpatient Hospital Stay (HOSPITAL_COMMUNITY): Payer: PRIVATE HEALTH INSURANCE

## 2016-06-16 DIAGNOSIS — J9622 Acute and chronic respiratory failure with hypercapnia: Secondary | ICD-10-CM

## 2016-06-16 DIAGNOSIS — E66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: Secondary | ICD-10-CM

## 2016-06-16 DIAGNOSIS — J9621 Acute and chronic respiratory failure with hypoxia: Secondary | ICD-10-CM

## 2016-06-16 DIAGNOSIS — Z930 Tracheostomy status: Secondary | ICD-10-CM

## 2016-06-16 DIAGNOSIS — N179 Acute kidney failure, unspecified: Secondary | ICD-10-CM

## 2016-06-16 DIAGNOSIS — I5032 Chronic diastolic (congestive) heart failure: Secondary | ICD-10-CM

## 2016-06-16 DIAGNOSIS — G4733 Obstructive sleep apnea (adult) (pediatric): Secondary | ICD-10-CM

## 2016-06-16 DIAGNOSIS — I160 Hypertensive urgency: Secondary | ICD-10-CM

## 2016-06-16 LAB — GLUCOSE, CAPILLARY
Glucose-Capillary: 123 mg/dL — ABNORMAL HIGH (ref 65–99)
Glucose-Capillary: 128 mg/dL — ABNORMAL HIGH (ref 65–99)
Glucose-Capillary: 131 mg/dL — ABNORMAL HIGH (ref 65–99)
Glucose-Capillary: 133 mg/dL — ABNORMAL HIGH (ref 65–99)
Glucose-Capillary: 135 mg/dL — ABNORMAL HIGH (ref 65–99)
Glucose-Capillary: 137 mg/dL — ABNORMAL HIGH (ref 65–99)

## 2016-06-16 LAB — COMPREHENSIVE METABOLIC PANEL
ALT: 27 U/L (ref 14–54)
AST: 35 U/L (ref 15–41)
Albumin: 2.8 g/dL — ABNORMAL LOW (ref 3.5–5.0)
Alkaline Phosphatase: 80 U/L (ref 38–126)
Anion gap: 10 (ref 5–15)
BUN: 42 mg/dL — ABNORMAL HIGH (ref 6–20)
CO2: 33 mmol/L — ABNORMAL HIGH (ref 22–32)
Calcium: 9.9 mg/dL (ref 8.9–10.3)
Chloride: 112 mmol/L — ABNORMAL HIGH (ref 101–111)
Creatinine, Ser: 1.02 mg/dL — ABNORMAL HIGH (ref 0.44–1.00)
GFR calc Af Amer: >60 mL/min (ref >60)
GFR calc non Af Amer: >60 mL/min (ref >60)
Glucose, Bld: 111 mg/dL — ABNORMAL HIGH (ref 65–99)
Potassium: 4.2 mmol/L (ref 3.5–5.1)
Sodium: 155 mmol/L — ABNORMAL HIGH (ref 135–145)
Total Bilirubin: 1.2 mg/dL (ref 0.3–1.2)
Total Protein: 7.4 g/dL (ref 6.5–8.1)

## 2016-06-16 LAB — CBC
HCT: 48.4 % — ABNORMAL HIGH (ref 36.0–46.0)
Hemoglobin: 13.9 g/dL (ref 12.0–15.0)
MCH: 24 pg — ABNORMAL LOW (ref 26.0–34.0)
MCHC: 28.7 g/dL — ABNORMAL LOW (ref 30.0–36.0)
MCV: 83.7 fL (ref 78.0–100.0)
Platelets: 161 10*3/uL (ref 150–400)
RBC: 5.78 MIL/uL — ABNORMAL HIGH (ref 3.87–5.11)
RDW: 22.8 % — ABNORMAL HIGH (ref 11.5–15.5)
WBC: 6.1 10*3/uL (ref 4.0–10.5)

## 2016-06-16 LAB — MAGNESIUM: Magnesium: 2.2 mg/dL (ref 1.7–2.4)

## 2016-06-16 MED ORDER — ENOXAPARIN SODIUM 80 MG/0.8ML ~~LOC~~ SOLN
80.0000 mg | SUBCUTANEOUS | Status: DC
  Administered 2016-06-16 – 2016-06-17 (×2): 80 mg via SUBCUTANEOUS
  Filled 2016-06-16 (×2): qty 0.8

## 2016-06-16 MED ORDER — FREE WATER
250.0000 mL | Status: DC
  Administered 2016-06-16 – 2016-06-19 (×17): 250 mL

## 2016-06-16 MED ORDER — FREE WATER
400.0000 mL | Status: DC
  Administered 2016-06-16: 400 mL

## 2016-06-16 NOTE — Clinical Social Work Note (Signed)
Clinical Social Work Assessment  Patient Details  Name: [name] [name] MRN: [id number] Date of Birth: Aug 03, 1981  Date of referral:  06/16/16               Reason for consult:  Facility Placement                Permission sought to share information with:  Family Supports, Chartered certified accountant granted to share information::  Yes, Verbal Permission Granted  Name::     Visual merchandiser::  Vent SNFs  Relationship::  mom  Contact Information:     Housing/Transportation Living arrangements for the past 2 months:  [place] of Information:  Patient, Parent Patient Interpreter Needed:  None Criminal Activity/Legal Involvement Pertinent to Current Situation/Hospitalization:  No - Comment as needed Significant Relationships:  Parents, Dependent Children Lives with:  Minor Children Do you feel safe going back to the place where you live?  Yes Need for family participation in patient care:  Yes (Comment) (will depend on mom for care if decides to go home)  Care giving concerns:  Pt lives at home with minor child- pt mom very involved with pt care and can offer assistance if pt DCs home   Social Worker assessment / plan:  CSW and RNCM spoke with pt and pt mom at bedside- pt unable to speak but involved in conversation with mouthing words and gestures. CSW was informed that mom was planning to take patient home on vent but mom states she was never talked to about what that would entail.  RNCM informed pt mom that it would require that mom be trained and that an additional person be trained in case mom unable to care for pt.  Pt mom unsure if she can handle this much care at home.  CSW informed pt and pt mom about Vent SNFs and benefits of vent SNF vs home (weaning efforts)- mom seems to think this would be better for the pt but pt concerned about lack of options for Vent SNFs- does not want to be transferred away from her young dtr.  Employment status:   Therapist, music:  Managed Care PT Recommendations:  LTAC Information / Referral to community resources:  [place]  Patient/Family's Response to care:  Pt and mom are not sure what they want to do at this point- willing to allow CSW to start looking at SNF options but will also discuss idea of going home further.  Patient/Family's Understanding of and Emotional Response to Diagnosis, Current Treatment, and Prognosis:  Pt seems overwhelmed by her condition as does her mom.  Was very depressed at idea of needing SNF at such a young age and is concerned with the wellbeing of her dtr.  Emotional Assessment Appearance:  Appears stated age Attitude/Demeanor/Rapport:    Affect (typically observed):  Appropriate Orientation:  Oriented to Situation, Oriented to  Time, Oriented to Place, Oriented to Self Alcohol / Substance use:  Not Applicable Psych involvement (Current and /or in the community):  Yes (Comment) (was evaluated earlier in admission but no continued need for psych involvement)  Discharge Needs  Concerns to be addressed:  Care Coordination Readmission within the last 30 days:  No Current discharge risk:  Physical Impairment Barriers to Discharge:  Continued Medical Work up   Jorge Ny, LCSW 06/16/2016, 3:07 PM

## 2016-06-16 NOTE — Progress Notes (Signed)
Pt desat w/ suctioning, fio2 increased to 40% ATC, sat now 94-95%.  Pt tol well currently, no distress noted.

## 2016-06-16 NOTE — Progress Notes (Signed)
PULMONARY / CRITICAL CARE MEDICINE   Name: [name] [name] MRN: [id number] DOB: [birth year]    ADMISSION DATE:  05/25/2016 CONSULTATION DATE:  05/27/16  REFERRING MD:  Dr. Ree Kida - [facility]  CHIEF COMPLAINT:  Altered Mental Status   BRIEF: [name] with PMH of morbid obesity (469 lbs), DM, HTN and tachycardia who presented to [facility] on 10/3 with acute on chronic respiratory failure with hypercarbia and hypoxemia due to diastolic CHF exacerbation and obesity hypoventilation syndrome.  Ultimately required tracheostomy due to prolonged respiratory failure. Diuresed over 100 lbs.   SUBJECTIVE:  No acute events overnight. Weaning on ATC 35%.  PT recommending LTAC   VITAL SIGNS: BP (!) 146/97   Pulse 61   Temp 99.1 F (37.3 C) (Axillary)   Resp (!) 21   Ht _0  (1.6 m)   Wt (!) 359 lb (162.8 kg) Comment: pt bed bound and unable to stand for accurate weight.   LMP  (LMP Unknown)   SpO2 94%   BMI 63.59 kg/m   HEMODYNAMICS:    VENTILATOR SETTINGS: Vent Mode: PRVC FiO2 (%):  [30 %-40 %] 35 % Set Rate:  [16 bmp] 16 bmp Vt Set:  [420 mL] 420 mL PEEP:  [5 cmH20] 5 cmH20 Plateau Pressure:  [13 cmH20-23 cmH20] 17 cmH20  INTAKE / OUTPUT: I/O last 3 completed shifts: In: 3000 [I.V.:510; NG/GT:2290; IV Piggyback:200] Out: 9323 [Urine:3500; Stool:350]  PHYSICAL EXAMINATION: General: Morbidly obese. No distress. lying in bed. HENT: #6 proximal XLT trach c/d/i,  moist mucous membranes. No scleral icterus. PULM:   Course referred breath sounds. Normal work of breathing on tracheostomy collar. Symmetric chest wall expansion.  CV: Regular rate. No appreciable JVD given body habitus. GI:  Soft. Protuberant. Normal bowel sounds. Derm: Warm & dry. No rash on exposed skin. Neuro: Awake. Following commands. Grossly nonfocal & following commands.   LABS: PULMONARY No results for input(s): PHART, PCO2ART, PO2ART, HCO3, TCO2, O2SAT in the last 168 hours.  Invalid input(s): PCO2, PO2    CBC  Recent Labs Lab 06/14/16 0300 06/15/16 0520 06/16/16 0500  HGB 14.0 13.8 13.9  HCT 49.4* 48.3* 48.4*  WBC 6.5 6.2 6.1  PLT 180 163 161   COAGULATION No results for input(s): INR in the last 168 hours.   CARDIAC   No results for input(s): TROPONINI in the last 168 hours. No results for input(s): PROBNP in the last 168 hours.  CHEMISTRY  Recent Labs Lab 06/10/16 0500 06/11/16 0500 06/12/16 0501 06/14/16 0300 06/15/16 0520 06/16/16 0500  NA 153* 157* 157* 160* 154* 155*  K 4.2 3.7 3.2* 3.8 3.9 4.2  CL 109 113* 117* 120* 112* 112*  CO2 36* 35* 32 31 31 33*  GLUCOSE 135* 130* 125* 116* 127* 111*  BUN 90* 72* 50* 48* 49* 42*  CREATININE 1.24* 1.12* 0.93 0.88 1.14* 1.02*  CALCIUM 9.8 9.6 10.0 9.9 9.7 9.9  MG 2.6* 2.7* 2.4  --  2.3 2.2  PHOS 4.3 4.5 2.6  --  4.9*  --    Estimated Creatinine Clearance: 116.3 mL/min (by C-G formula based on SCr of 1.02 mg/dL (H)).  LIVER  Recent Labs Lab 06/16/16 0500  AST 35  ALT 27  ALKPHOS 80  BILITOT 1.2  PROT 7.4  ALBUMIN 2.8*    INFECTIOUS  Recent Labs Lab 06/10/16 0500 06/11/16 0500  PROCALCITON <0.10 0.17   ENDOCRINE CBG (last 3)   Recent Labs  06/15/16 2349 06/16/16 0441 06/16/16 0825  GLUCAP 102* 123* 128*  STUDIES:  TTE 10/14 >> EF 55%, PAP 42  MICROBIOLOGY: MRSA PCR 10/5 >>  Negative Tracheal Asp Ctx 10/17 >>  Oral Flora Blood Ctx x2 10/18 >>  Negative Tracheal Asp Ctx 10/19 >>  Oral Flora  ANTIBIOTICS: Vanc 10/18 - 10/22 Zosyn 10/18 - 10/24  SIGNIFICANT EVENTS: 10/03  Admit 10/05  Transferred to ICU due to AMS, hypercarbic respiratory failure 10/06  Bipap, ntg gtt & BP remains 030'S systolic.  Net neg 5.5 L in last 24 hours w improvement in sr cr 10/07  intubated overnight .  10/07 - 10/14 diuresed aggressively 10/14  Attempting wean 10/15  Weaning 10/18  febrile 10/21  Awake, following commands, PSV  10/25  Weaning on ATC 35%  LINES: ETT 10/7 - 10/21 Trach  #6 10/21  >> R IJ TLC 10/10 >>  NGT R 10/21 >> Foley 10/7 >> PIV x1  ASSESSMENT/PLAN:  35 y.o. with PMH of super morbid obesity, HTN, pulmonary hypertension admitted with decompensated diastolic CHF.  Patient tolerating long periods of tracheostomy collar. Secretions are slowly improving. Discussed nocturnal ventilator use with respiratory therapy and nurse at bedside. Holding on [facility] valve/speech consult at this time given continued secretions.   1. Acute on Chronic Hypoxic & Hypercarbic Respiratory Failure - S/P tracheostomy. Continuing intermittent tracheostomy collar trials and full ventilator support for increased work of breathing/rest. Weaning for Goal Sat 88-94%. 2. Pulmonary Hypertension:  Multifactorial. Gentle intermittent diuresis per cardiology. 3. Severe OSA/OHS:  Recommend continued nocturnal ventilator. If decanulated would need to ensure NIPPV Compliance. 4. Possible HCAP:  Rx'd with Zosyn. No evidence of infection on cultures.  5. Dysphagia - in setting of vent/trach.  SLP evaluation for PMV / swallowing  Remainder of care as per primary service.  PCCM will follow up 10/30.  Please call sooner if new needs arise.   Noe Gens, NP-C [facility] Pgr: [phone] or if no answer [phone] 06/16/2016, 10:04 AM  Attending Note:  35 year old female with super morbid obesity s/p trach placement who is tolerating TC some during the day but requiring vent at night.  On exam, trach in good position and BS are coarse bilaterally.  I reviewed CXR myself, trach is good position.  Discussed with PCCM-NP.  Chronic respiratory failure:  - Vent support at night.  - TC during the day.  Trach status:  - Maintain 6 cuffed trach.  Hypoxemia:  - Titrate O2 for sat of 88-92%.  Morbid obesity:  - Nutrition consult.  HCAP:  - D/C abx at this point  PCCM will continue to follow.  Patient seen and examined, agree with above note.  I dictated the care and orders  written for this patient under my direction.  Rush Farmer, MD [phone]

## 2016-06-16 NOTE — Progress Notes (Signed)
DAILY PROGRESS NOTE  Subjective:  No events overnight. HR and BP better controlled on labetalol. Remains mildly net negative on current diuretic dose. Plan for eventual LTAC.  Objective:  Temp:  [97.9 F (36.6 C)-99.1 F (37.3 C)] 99.1 F (37.3 C) (10/25 0800) Pulse Rate:  [61-97] 61 (10/25 0942) Resp:  [14-27] 21 (10/25 0829) BP: (113-155)/(63-97) 146/97 (10/25 0942) SpO2:  [92 %-99 %] 94 % (10/25 0829) FiO2 (%):  [30 %-40 %] 35 % (10/25 0900) Weight:  [359 lb (162.8 kg)] 359 lb (162.8 kg) (10/25 0500) Weight change: 72 lb 8 oz (32.9 kg)  Intake/Output from previous day: 10/24 0701 - 10/25 0700 In: 1435 [I.V.:120; NG/GT:1215; IV Piggyback:100] Out: 1900 [Urine:1900]  Intake/Output from this shift: Total I/O In: 50 [NG/GT:50] Out: -   Medications: No current facility-administered medications on file prior to encounter.    Current Outpatient Prescriptions on File Prior to Encounter  Medication Sig Dispense Refill  . ibuprofen (ADVIL,MOTRIN) 200 MG tablet Take 600 mg by mouth every 6 (six) hours as needed for mild pain.      Physical Exam: General appearance: alert, no distress, morbidly obese and on trach collar Lungs: diminished breath sounds bilaterally Heart: regular rate and rhythm Extremities: edema significant Neurologic: Mental status: Alert, oriented, thought content appropriate  Lab Results: Results for orders placed or performed during the hospital encounter of 05/25/16 (from the past 48 hour(s))  Glucose, capillary     Status: Abnormal   Collection Time: 06/14/16 11:43 AM  Result Value Ref Range   Glucose-Capillary 162 (H) 65 - 99 mg/dL  Glucose, capillary     Status: Abnormal   Collection Time: 06/14/16  4:06 PM  Result Value Ref Range   Glucose-Capillary 111 (H) 65 - 99 mg/dL   Comment 1 Capillary Specimen   Glucose, capillary     Status: Abnormal   Collection Time: 06/14/16  8:35 PM  Result Value Ref Range   Glucose-Capillary 128 (H) 65 -  99 mg/dL   Comment 1 Capillary Specimen   Glucose, capillary     Status: Abnormal   Collection Time: 06/14/16 11:39 PM  Result Value Ref Range   Glucose-Capillary 124 (H) 65 - 99 mg/dL   Comment 1 Capillary Specimen   CBC     Status: Abnormal   Collection Time: 06/15/16  5:20 AM  Result Value Ref Range   WBC 6.2 4.0 - 10.5 K/uL   RBC 5.69 (H) 3.87 - 5.11 MIL/uL   Hemoglobin 13.8 12.0 - 15.0 g/dL    Comment: REPEATED TO VERIFY   HCT 48.3 (H) 36.0 - 46.0 %   MCV 84.9 78.0 - 100.0 fL   MCH 24.3 (L) 26.0 - 34.0 pg   MCHC 28.6 (L) 30.0 - 36.0 g/dL   RDW 23.2 (H) 11.5 - 15.5 %   Platelets 163 150 - 400 K/uL    Comment: SPECIMEN CHECKED FOR CLOTS REPEATED TO VERIFY PLATELET COUNT CONFIRMED BY SMEAR   Basic metabolic panel     Status: Abnormal   Collection Time: 06/15/16  5:20 AM  Result Value Ref Range   Sodium 154 (H) 135 - 145 mmol/L   Potassium 3.9 3.5 - 5.1 mmol/L   Chloride 112 (H) 101 - 111 mmol/L   CO2 31 22 - 32 mmol/L   Glucose, Bld 127 (H) 65 - 99 mg/dL   BUN 49 (H) 6 - 20 mg/dL   Creatinine, Ser 1.14 (H) 0.44 - 1.00 mg/dL   Calcium 9.7  8.9 - 10.3 mg/dL   GFR calc non Af Amer >60 >60 mL/min   GFR calc Af Amer >60 >60 mL/min    Comment: (NOTE) The eGFR has been calculated using the CKD EPI equation. This calculation has not been validated in all clinical situations. eGFR's persistently <60 mL/min signify possible Chronic Kidney Disease.    Anion gap 11 5 - 15  Magnesium     Status: None   Collection Time: 06/15/16  5:20 AM  Result Value Ref Range   Magnesium 2.3 1.7 - 2.4 mg/dL  Phosphorus     Status: Abnormal   Collection Time: 06/15/16  5:20 AM  Result Value Ref Range   Phosphorus 4.9 (H) 2.5 - 4.6 mg/dL  Glucose, capillary     Status: Abnormal   Collection Time: 06/15/16  5:22 AM  Result Value Ref Range   Glucose-Capillary 127 (H) 65 - 99 mg/dL  Glucose, capillary     Status: Abnormal   Collection Time: 06/15/16  8:23 AM  Result Value Ref Range    Glucose-Capillary 149 (H) 65 - 99 mg/dL  Glucose, capillary     Status: Abnormal   Collection Time: 06/15/16  9:17 PM  Result Value Ref Range   Glucose-Capillary 125 (H) 65 - 99 mg/dL   Comment 1 Notify RN   Glucose, capillary     Status: Abnormal   Collection Time: 06/15/16 11:49 PM  Result Value Ref Range   Glucose-Capillary 102 (H) 65 - 99 mg/dL   Comment 1 Notify RN   Glucose, capillary     Status: Abnormal   Collection Time: 06/16/16  4:41 AM  Result Value Ref Range   Glucose-Capillary 123 (H) 65 - 99 mg/dL  Comprehensive metabolic panel     Status: Abnormal   Collection Time: 06/16/16  5:00 AM  Result Value Ref Range   Sodium 155 (H) 135 - 145 mmol/L   Potassium 4.2 3.5 - 5.1 mmol/L   Chloride 112 (H) 101 - 111 mmol/L   CO2 33 (H) 22 - 32 mmol/L   Glucose, Bld 111 (H) 65 - 99 mg/dL   BUN 42 (H) 6 - 20 mg/dL   Creatinine, Ser 1.02 (H) 0.44 - 1.00 mg/dL   Calcium 9.9 8.9 - 10.3 mg/dL   Total Protein 7.4 6.5 - 8.1 g/dL   Albumin 2.8 (L) 3.5 - 5.0 g/dL   AST 35 15 - 41 U/L   ALT 27 14 - 54 U/L   Alkaline Phosphatase 80 38 - 126 U/L   Total Bilirubin 1.2 0.3 - 1.2 mg/dL   GFR calc non Af Amer >60 >60 mL/min   GFR calc Af Amer >60 >60 mL/min    Comment: (NOTE) The eGFR has been calculated using the CKD EPI equation. This calculation has not been validated in all clinical situations. eGFR's persistently <60 mL/min signify possible Chronic Kidney Disease.    Anion gap 10 5 - 15  CBC     Status: Abnormal   Collection Time: 06/16/16  5:00 AM  Result Value Ref Range   WBC 6.1 4.0 - 10.5 K/uL   RBC 5.78 (H) 3.87 - 5.11 MIL/uL   Hemoglobin 13.9 12.0 - 15.0 g/dL   HCT 48.4 (H) 36.0 - 46.0 %   MCV 83.7 78.0 - 100.0 fL   MCH 24.0 (L) 26.0 - 34.0 pg   MCHC 28.7 (L) 30.0 - 36.0 g/dL   RDW 22.8 (H) 11.5 - 15.5 %   Platelets 161 150 - 400  K/uL    Comment: PLATELET COUNT CONFIRMED BY SMEAR  Magnesium     Status: None   Collection Time: 06/16/16  5:00 AM  Result Value Ref  Range   Magnesium 2.2 1.7 - 2.4 mg/dL  Glucose, capillary     Status: Abnormal   Collection Time: 06/16/16  8:25 AM  Result Value Ref Range   Glucose-Capillary 128 (H) 65 - 99 mg/dL    Imaging: Dg Abd Portable 1v  Result Date: 06/16/2016 CLINICAL DATA:  NG tube placement EXAM: PORTABLE ABDOMEN - 1 VIEW COMPARISON:  06/13/2016 FINDINGS: Single portable view of the upper abdomen. Heart size appears enlarged. Esophageal tube tip is below the diaphragm, tip is not included on the imaged. IMPRESSION: Limited image of the upper abdomen demonstrates esophageal tube to be below the diaphragm, tip is non included but suspected to overlie the mid stomach. Electronically Signed   By: Donavan Foil M.D.   On: 06/16/2016 04:05    Assessment:  1. Principal Problem: 2.   Acute congestive heart failure ([place]) 3. Active Problems: 4.   Malignant hypertension 5.   Morbid obesity ([place]) 6.   OSA (obstructive sleep apnea) 7.   Renal insufficiency 8.   Acute pulmonary edema (HCC) 9.   Abdominal pain 10.   Hepatic congestion 11.   Sinus tachycardia 12.   Pericardial effusion 13.   Pulmonary hypertension 14.   Acute respiratory failure with hypoxemia (HCC) 15.   Difficult intravenous access 16.   Sinus pause 17.   HCAP (healthcare-associated pneumonia) 53.   Respiratory failure ([place]) 19.   Acute on chronic respiratory failure with hypoxia and hypercapnia (HCC) 20.   Plan:  1. Continues to be net negative - will face long-term trach care issues. BP and HR better controlled back on home dose labetalol, amlodipine and isordil.  Time Spent Directly with Patient:  15 minutes  Length of Stay:  LOS: 22 days   Pixie Casino, MD, [facility] Attending Cardiologist [place] 06/16/2016, 10:34 AM

## 2016-06-16 NOTE — Progress Notes (Signed)
Per RN, Dr Clydene Laming called and request pt be placed back on vent d/t chest xray results.  Pt tol well currently.  NO distress noted.  VSS.  RN at bedside and aware.

## 2016-06-16 NOTE — Progress Notes (Signed)
Occupational Therapy Treatment Patient Details Name: [name] [name] MRN: [id number] DOB: [birth year] Today's Date: 06/16/2016    History of present illness [name] with PMH of morbid obesity (469 lbs), DM, HTN and tachycardia who presented to [facility] on 10/3 with reports of abdominal bloating and heavy abdominal pressure.  Intubated 05/29/16 to present, trached 06/10/16.   OT comments  Pt tolerating bed level UB strengthening exercises, grooming and cognitive activities. Pt now able to access call button/tv remote and perform hand to top of head bilaterally.  Follow Up Recommendations  LTACH;Supervision/Assistance - 24 hour    Equipment Recommendations       Recommendations for Other Services      Precautions / Restrictions Precautions Precautions: Fall Precaution Comments: trach collar       Mobility Bed Mobility                  Transfers                      Balance                                   ADL Overall ADL's : Needs assistance/impaired     Grooming: Wash/dry face;Bed level;Minimal assistance;Oral care Grooming Details (indicate cue type and reason): wiped eyes with wet washcloth, swabbed mouth, NGT limits ability to wash full face                               General ADL Comments: Using tv remote effectively, attempted to access smartphone, impaired coordination.      Vision                     Perception     Praxis      Cognition   Behavior During Therapy: [facility] for tasks assessed/performed Overall Cognitive Status: Within Functional Limits for tasks assessed                  General Comments: Pt attending during UB exercises to keep count of repetitions. Demonstrated recall and ability to use nurse call button/tv remote    Extremity/Trunk Assessment               Exercises General Exercises - Upper Extremity Shoulder Flexion: Strengthening;Both;15 reps;Bar weights/barbell Bar  Weights/Barbell (Shoulder Flexion):  (1/2) Shoulder Horizontal ABduction: Strengthening;Both;10 reps;Bar weights/barbell Bar Weights/Barbell (Shoulder Horizontal Abduction):  (1/2) Shoulder Horizontal ADduction: Strengthening;Both;10 reps;Bar weights/barbell Bar Weights/Barbell (Shoulder Horizontal Adduction):  (1/2) Elbow Flexion: Strengthening;Both;10 reps;Bar weights/barbell Bar Weights/Barbell (Elbow Flexion):  (1/2) Elbow Extension: Strengthening;Both;10 reps;Bar weights/barbell Bar Weights/Barbell (Elbow Extension):  (1/2) Wrist Flexion: AROM;10 reps;Both Wrist Extension: AROM;Both;10 reps Digit Composite Flexion: AROM;Both;10 reps Composite Extension: AROM;Both;10 reps   Shoulder Instructions       General Comments      Pertinent Vitals/ Pain       Pain Assessment: No/denies pain  Home Living                                          Prior Functioning/Environment              Frequency           Progress Toward Goals  OT Goals(current goals can now be found  in the care plan section)  Progress towards OT goals: Progressing toward goals  Acute Rehab OT Goals Time For Goal Achievement: 06/23/16 Potential to Achieve Goals: Good  Plan Frequency remains appropriate    Co-evaluation                 End of Session     Activity Tolerance Patient tolerated treatment well   Patient Left in bed;with call bell/phone within reach;with nursing/sitter in room   Nurse Communication  (pt requesting suctioning)        Time: 1515-1530 OT Time Calculation (min): 15 min  Charges: OT General Charges $OT Visit: 1 Procedure OT Treatments $Therapeutic Exercise: 8-22 mins  Malka So 06/16/2016, 3:46 PM  [phone]

## 2016-06-16 NOTE — Progress Notes (Signed)
PROGRESS NOTE    [name] [name]  BPZ:[id number] DOB: [birth year] DOA: 05/25/2016 PCP: No PCP Per Patient   Brief Narrative:  35 y.o.  BF PMHx HTN, Sinus tachycardia, Gestational DM, Super Morbid Obesity, OSA, Chronic Respiratory Failure, Medical Noncompliance   Who reports a 1-2 week history of progressive dyspnea on exertion and orthopnea with lower extremity and abdominal wall edema.  She does not know how much weight she has gained in the past 2-3 months, but she knows that her weight is up.  She has had noticeable increased abdominal girth.  She explicitly denies chest pain, pressure, or tightness.  No light-headedness or loss of consciousness.   She admits to noncompliance with recommended treatment for hypertension for YEARS.    Subjective: 10/25  alert states was supposed to be on CPAP/BiPAP at home but has not been using.   Assessment & Plan:   Principal Problem:   Acute congestive heart failure (HCC) Active Problems:   Malignant hypertension   Morbid obesity (HCC)   OSA (obstructive sleep apnea)   Renal insufficiency   Acute pulmonary edema (HCC)   Abdominal pain   Hepatic congestion   Sinus tachycardia   Pericardial effusion   Pulmonary hypertension   Acute respiratory failure with hypoxemia (HCC)   Difficult intravenous access   Sinus pause   HCAP (healthcare-associated pneumonia)   Respiratory failure (HCC)   Acute on chronic respiratory failure with hypoxia and hypercapnia (HCC)   Tracheostomy status (HCC)   Acute diastolic CHF (congestive heart failure) (HCC)   Hypertensive urgency   Obesity hypoventilation syndrome ([place])   Acute kidney injury ([place])   Acute on Chronic Hypoxemic and Hypercarbic Respiratory Failure  -S/p tracheostomy - ongoing vent care per PCCM  -Tracheostomy suctioning Q Shift -10/25 PCXR: Worsening vascular congestion. Will place patient back on vent  Acute Diastolic CHF  -Strict in and out since admission -18.5 L -Daily  weight Filed Weights   06/14/16 0500 06/15/16 0500 06/16/16 0500  Weight: 129.3 kg (285 lb) 130 kg (286 lb 8 oz) (!) 162.8 kg (359 lb)  - diuretic being dosed by Cards, Currently a Lasix 60 mg daily -Amlodipine 10 mg daily -Hydralazine 75 mg TID -Isosorbide dinitrate 30 mg BID -Labetalol 200 mg BID  Secondary pulmonary hypertension -As per CHF tx plan   Hypertensive Urgency -Resolved - BP currently well controlled - follow trend   Severe OSA / OHS with acute decompensation  -To continue QHS vent support - PCCM directing care   AKI / Azotemia 2/2 diuresis -Diuretic per cardiology  ?HCAP -fevers, purulent secretions, sub-optimal CXR r/t body habitus  - completed 6 days of empiric abx tx   -Suction tracheostomy q shift  Demand ischemia -Cards following   Hypernatremia  -Increase free water 400 ml q 4 hr during the day. Now has been returned to 250 ml q 4 hr  Hypokalemia -Corrected  Morbid obesity - Body mass index is 50.75 kg/m.  Altered Mental Status -Resolved   Depression -family reports pt is in "denial" - seen by Psych 05/28/16      DVT prophylaxis: Lovenox Code Status: Full Family Communication:  Disposition Plan: Per cardiology   Consultants:  Dr.Wesam Kathryne Sharper PCCM [facility]     Procedures/Significant Events:  10/4 Echocardiogram: LVEF=:  50%to 55%.-- Left atrium:  mildly dilated.- Right atrium: mildly dilated. - Pulmonary arteries PA  peak pressure: 42 mm Hg (S). 10/05 Transferred to ICU due to AMS, hypercarbic respiratory failure 10/06 Bipap, ntg  gtt &BP remains elevated in 789'F systolic. Net neg 5.5 L in last 24 hours with improvement in sr cr 10/07 intubated overnight .  10/07 -10/14 diuresed aggressively 10/14 Attempting wean 10/15 Weaning 10/18 febrile 10/21 Awake, following commands, PSV   Cultures 10/5 MRSA PCR negative 10/17 respiratory culture normal flora 10/18 blood left/right hand  negative 10/19 tracheal aspirate normal flora   Antimicrobials: Vanc 10/18 -10/22 Zosyn 10/18 - 10/23   Devices    LINES / TUBES:  #6 cuffed extended trach>>    Continuous Infusions:    Objective: Vitals:   06/16/16 1151 06/16/16 1200 06/16/16 1603 06/16/16 1719  BP:  133/80  129/83  Pulse: 94 87 84   Resp: (!) 21 (!) 23 18   Temp:  98 F (36.7 C)    TempSrc:  Oral    SpO2: 94% 95% 91%   Weight:      Height:        Intake/Output Summary (Last 24 hours) at 06/16/16 1727 Last data filed at 06/16/16 1300  Gross per 24 hour  Intake              125 ml  Output             2225 ml  Net            -2100 ml   Filed Weights   06/14/16 0500 06/15/16 0500 06/16/16 0500  Weight: 129.3 kg (285 lb) 130 kg (286 lb 8 oz) (!) 162.8 kg (359 lb)    Examination:  General: Alert, answers yes and no nods and mouth answers appropriately. Positive acute respiratory distress Eyes: negative scleral hemorrhage, negative anisocoria, negative icterus ENT: Negative Runny nose, negative gingival bleeding, Neck:  Negative scars, masses, torticollis, lymphadenopathy, JVD, tracheostomy and placed Lungs: Diffuse rhonchi, negative wheezing/crackles  Cardiovascular: Regular rate and rhythm without murmur gallop or rub normal S1 and S2 Abdomen: MORBIDLY OBESE, negative abdominal pain, nondistended, positive soft, bowel sounds, no rebound, no ascites, no appreciable mass Extremities: No significant cyanosis, clubbing, bilateral lower extremity obesity with edema bilaterally  Skin: Negative rashes, lesions, ulcers Psychiatric:  Negative depression, negative anxiety, negative fatigue, negative mania  Central nervous system:  Cranial nerves II through XII intact, tongue/uvula midline, all extremities muscle strength 5/5, sensation intact throughout, negative receptive aphasia. Follows all commands, cooperates with exam  .     Data Reviewed: Care during the described time interval was provided  by me .  I have reviewed this patient's available data, including medical history, events of note, physical examination, and all test results as part of my evaluation. I have personally reviewed and interpreted all radiology studies.  CBC:  Recent Labs Lab 06/11/16 0500 06/12/16 0501 06/14/16 0300 06/15/16 0520 06/16/16 0500  WBC 7.5 7.6 6.5 6.2 6.1  HGB 14.1 14.7 14.0 13.8 13.9  HCT 48.9* 51.3* 49.4* 48.3* 48.4*  MCV 85.5 85.1 84.9 84.9 83.7  PLT 170 159 180 163 810   Basic Metabolic Panel:  Recent Labs Lab 06/10/16 0500 06/11/16 0500 06/12/16 0501 06/14/16 0300 06/15/16 0520 06/16/16 0500  NA 153* 157* 157* 160* 154* 155*  K 4.2 3.7 3.2* 3.8 3.9 4.2  CL 109 113* 117* 120* 112* 112*  CO2 36* 35* 32 31 31 33*  GLUCOSE 135* 130* 125* 116* 127* 111*  BUN 90* 72* 50* 48* 49* 42*  CREATININE 1.24* 1.12* 0.93 0.88 1.14* 1.02*  CALCIUM 9.8 9.6 10.0 9.9 9.7 9.9  MG 2.6* 2.7* 2.4  --  2.3 2.2  PHOS 4.3 4.5 2.6  --  4.9*  --    GFR: Estimated Creatinine Clearance: 116.3 mL/min (by C-G formula based on SCr of 1.02 mg/dL (H)). Liver Function Tests:  Recent Labs Lab 06/16/16 0500  AST 35  ALT 27  ALKPHOS 80  BILITOT 1.2  PROT 7.4  ALBUMIN 2.8*   No results for input(s): LIPASE, AMYLASE in the last 168 hours. No results for input(s): AMMONIA in the last 168 hours. Coagulation Profile: No results for input(s): INR, PROTIME in the last 168 hours. Cardiac Enzymes: No results for input(s): CKTOTAL, CKMB, CKMBINDEX, TROPONINI in the last 168 hours. BNP (last 3 results) No results for input(s): PROBNP in the last 8760 hours. HbA1C: No results for input(s): HGBA1C in the last 72 hours. CBG:  Recent Labs Lab 06/15/16 2349 06/16/16 0441 06/16/16 0825 06/16/16 1216 06/16/16 1712  GLUCAP 102* 123* 128* 137* 131*   Lipid Profile: No results for input(s): CHOL, HDL, LDLCALC, TRIG, CHOLHDL, LDLDIRECT in the last 72 hours. Thyroid Function Tests: No results for  input(s): TSH, T4TOTAL, FREET4, T3FREE, THYROIDAB in the last 72 hours. Anemia Panel: No results for input(s): VITAMINB12, FOLATE, FERRITIN, TIBC, IRON, RETICCTPCT in the last 72 hours. Urine analysis:    Component Value Date/Time   COLORURINE YELLOW 05/25/2016 1410   APPEARANCEUR CLEAR 05/25/2016 1410   LABSPEC 1.012 05/25/2016 1410   PHURINE 5.0 05/25/2016 1410   GLUCOSEU NEGATIVE 05/25/2016 1410   HGBUR NEGATIVE 05/25/2016 1410   BILIRUBINUR NEGATIVE 05/25/2016 1410   KETONESUR NEGATIVE 05/25/2016 1410   PROTEINUR NEGATIVE 05/25/2016 1410   UROBILINOGEN 1.0 12/12/2013 2146   NITRITE NEGATIVE 05/25/2016 1410   LEUKOCYTESUR NEGATIVE 05/25/2016 1410   Sepsis Labs: _0 (procalcitonin:4,lacticidven:4)  ) Recent Results (from the past 240 hour(s))  Culture, respiratory (NON-Expectorated)     Status: None   Collection Time: 06/08/16 11:05 AM  Result Value Ref Range Status   Specimen Description TRACHEAL ASPIRATE  Final   Special Requests Normal  Final   Gram Stain   Final    MODERATE WBC PRESENT,BOTH PMN AND MONONUCLEAR MODERATE GRAM VARIABLE ROD FEW GRAM POSITIVE COCCI IN PAIRS    Culture Consistent with normal respiratory flora.  Final   Report Status 06/10/2016 FINAL  Final  Culture, blood (routine x 2)     Status: None   Collection Time: 06/09/16 10:30 AM  Result Value Ref Range Status   Specimen Description BLOOD LEFT HAND  Final   Special Requests BOTTLES DRAWN AEROBIC ONLY 10 CC  Final   Culture NO GROWTH 5 DAYS  Final   Report Status 06/14/2016 FINAL  Final  Culture, blood (routine x 2)     Status: None   Collection Time: 06/09/16 10:45 AM  Result Value Ref Range Status   Specimen Description BLOOD RIGHT HAND  Final   Special Requests BOTTLES DRAWN AEROBIC ONLY 10 CC  Final   Culture NO GROWTH 5 DAYS  Final   Report Status 06/14/2016 FINAL  Final  Culture, respiratory (NON-Expectorated)     Status: None   Collection Time: 06/10/16  9:32 AM  Result Value  Ref Range Status   Specimen Description TRACHEAL ASPIRATE  Final   Special Requests NONE  Final   Gram Stain   Final    ABUNDANT WBC PRESENT, PREDOMINANTLY PMN RARE GRAM POSITIVE COCCI IN PAIRS RARE GRAM POSITIVE RODS    Culture Consistent with normal respiratory flora.  Final   Report Status 06/12/2016 FINAL  Final  Radiology Studies: Dg Chest Port 1 View  Result Date: 06/16/2016 CLINICAL DATA:  Pulmonary edema. Hx of DM, HTN, morbid obesity, sinus tachycardia. EXAM: PORTABLE CHEST 1 VIEW COMPARISON:  06/14/2016 FINDINGS: Tracheostomy tube, NGT, central line unchanged. Stable enlarged cardiac silhouette. There is perihilar venous congestion increased prior. Bibasilar atelectasis. IMPRESSION: Increasing vascular congestion, cardiomegaly and basilar atelectasis. Findings suggest worsening congestive heart failure. Electronically Signed   By: Suzy Bouchard M.D.   On: 06/16/2016 16:24   Dg Abd Portable 1v  Result Date: 06/16/2016 CLINICAL DATA:  NG tube placement EXAM: PORTABLE ABDOMEN - 1 VIEW COMPARISON:  06/13/2016 FINDINGS: Single portable view of the upper abdomen. Heart size appears enlarged. Esophageal tube tip is below the diaphragm, tip is not included on the imaged. IMPRESSION: Limited image of the upper abdomen demonstrates esophageal tube to be below the diaphragm, tip is non included but suspected to overlie the mid stomach. Electronically Signed   By: Donavan Foil M.D.   On: 06/16/2016 04:05        Scheduled Meds: . amLODipine  10 mg Per Tube Daily  . chlorhexidine gluconate (MEDLINE KIT)  15 mL Mouth Rinse BID  . enoxaparin (LOVENOX) injection  80 mg Subcutaneous Q24H  . famotidine (PEPCID) IV  20 mg Intravenous Q12H  . feeding supplement (PRO-STAT SUGAR FREE 64)  60 mL Per Tube TID  . feeding supplement (VITAL HIGH PROTEIN)  1,000 mL Per Tube Q24H  . free water  250 mL Per Tube Q4H  . furosemide  60 mg Intravenous Daily  . hydrALAZINE  75 mg Oral Q8H   . isosorbide dinitrate  30 mg Per Tube BID  . labetalol  200 mg Per Tube BID  . mouth rinse  15 mL Mouth Rinse Q4H  . potassium chloride  40 mEq Per Tube BID   Continuous Infusions:    LOS: 22 days    Time spent: 40 minutes    Leaira Fullam, Geraldo Docker, MD [facility] Pager [phone]   If 7PM-7AM, please contact night-coverage [email or web address] Password [email or web address] 06/16/2016, 5:27 PM

## 2016-06-16 NOTE — NC FL2 (Addendum)
[place] LEVEL OF CARE SCREENING TOOL     IDENTIFICATION  Patient Name: [name] [name] Birthdate: 27-Feb-1981 Sex: female Admission Date (Current Location): 05/25/2016  [facility] and [place] Number:  Herbalist and Address:  [facility] [place], [place] [address] [place], [place] [address]      Provider Number: [id number]  Attending Physician Name and Address:  Allie Bossier, MD  Relative Name and Phone Number:       Current Level of Care: Hospital Recommended Level of Care: [place] Prior Approval Number:    Date Approved/Denied:   PASRR Number: [id number]  Discharge Plan: SNF    Current Diagnoses: Patient Active Problem List   Diagnosis Date Noted  . Tracheostomy status ([place])   . Acute on chronic respiratory failure with hypoxia and hypercapnia (HCC)   . Respiratory failure ([place])   . HCAP (healthcare-associated pneumonia)   . Sinus pause 06/07/2016  . Difficult intravenous access   . Acute respiratory failure with hypoxemia ([place])   . Hepatic congestion 05/26/2016  . Sinus tachycardia 05/26/2016  . Pericardial effusion 05/26/2016  . Pulmonary hypertension   . Renal insufficiency 05/25/2016  . Abnormal EKG 05/25/2016  . Acute pulmonary edema ([place]) 05/25/2016  . Abdominal pain 05/25/2016  . CHF (congestive heart failure) ([place]) 05/25/2016  . Acute congestive heart failure ([place]) 05/25/2016  . Oligohydramnios, delivered 05/27/2013  . NSVD (normal spontaneous vaginal delivery) 05/27/2013  . Pregnancy 11/02/2012  . OSA (obstructive sleep apnea) 07/09/2011  . 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 Morbid obesity ([place]) 05/18/2011    Orientation RESPIRATION BLADDER Height & Weight     Alert and Oriented x4 Trach, 71m Shiley Cuffed Incontinent, Indwelling catheter Weight: (!) 359 lb (162.8 kg) (pt bed bound and unable to stand for accurate weight. ) Height:  5' 3" (160 cm)  BEHAVIORAL SYMPTOMS/MOOD NEUROLOGICAL  BOWEL NUTRITION STATUS      Incontinent (rectal tube) NG/panda (10/25- requiring NG tube)  AMBULATORY STATUS COMMUNICATION OF NEEDS Skin   Extensive Assist Verbally Surgical wounds                       Personal Care Assistance Level of Assistance  Bathing, Dressing Bathing Assistance: Maximum assistance Feeding assistance: Independent Dressing Assistance: Maximum assistance     Functional Limitations Info  Sight, Hearing, Speech Sight Info: Adequate Hearing Info: Adequate Speech Info: Impaired (trach)    SPECIAL CARE FACTORS FREQUENCY  OT (By licensed OT), PT (By licensed PT)     PT Frequency: 5/wk OT Frequency: 5/wk            Contractures Contractures Info: Not present    Additional Factors Info  Code Status Code Status Info: FULL Allergies Info: NKA           Current Medications (06/16/2016):  This is the current hospital active medication list Current Facility-Administered Medications  Medication Dose Route Frequency Provider Last Rate Last Dose  . acetaminophen (TYLENOL) solution 650 mg  650 mg Per Tube Q6H PRN JCherene Altes MD      . amLODipine (NORVASC) tablet 10 mg  10 mg Per Tube Daily DLarey Dresser MD   10 mg at 06/16/16 0941  . chlorhexidine gluconate (MEDLINE KIT) (PERIDEX) 0.12 % solution 15 mL  15 mL Mouth Rinse BID DRaylene Miyamoto MD   15 mL at 06/16/16 0900  . enoxaparin (LOVENOX) injection 80 mg  80 mg Subcutaneous Q24H CAllie Bossier  MD      . famotidine (PEPCID) IVPB 20 mg premix  20 mg Intravenous Q12H Anders Simmonds, MD   20 mg at 06/16/16 0942  . feeding supplement (PRO-STAT SUGAR FREE 64) liquid 60 mL  60 mL Per Tube TID Rush Farmer, MD   60 mL at 06/16/16 0941  . feeding supplement (VITAL HIGH PROTEIN) liquid 1,000 mL  1,000 mL Per Tube Q24H Rush Farmer, MD   1,000 mL at 06/16/16 0400  . fentaNYL (SUBLIMAZE) injection 25-50 mcg  25-50 mcg Intravenous Q1H PRN Rush Farmer, MD   50 mcg at 06/04/16 1300  . free water  400 mL  400 mL Per Tube Q4H Allie Bossier, MD   400 mL at 06/16/16 1000  . furosemide (LASIX) injection 60 mg  60 mg Intravenous Daily Pixie Casino, MD   60 mg at 06/16/16 0942  . hydrALAZINE (APRESOLINE) injection 10 mg  10 mg Intravenous Q4H PRN Raylene Miyamoto, MD   10 mg at 06/14/16 0902  . hydrALAZINE (APRESOLINE) tablet 75 mg  75 mg Oral Q8H Minus Breeding, MD   75 mg at 06/16/16 0536  . isosorbide dinitrate (ISORDIL) tablet 30 mg  30 mg Per Tube BID Cherene Altes, MD   30 mg at 06/16/16 0941  . labetalol (NORMODYNE) tablet 200 mg  200 mg Per Tube BID Pixie Casino, MD   200 mg at 06/16/16 0942  . MEDLINE mouth rinse  15 mL Mouth Rinse Q4H Brandi L Ollis, NP   15 mL at 06/16/16 0800  . ondansetron (ZOFRAN) tablet 4 mg  4 mg Oral Q6H PRN Lily Kocher, MD       Or  . ondansetron [facility]) injection 4 mg  4 mg Intravenous Q6H PRN Lily Kocher, MD      . potassium chloride 20 MEQ/15ML (10%) solution 40 mEq  40 mEq Per Tube BID Raylene Miyamoto, MD   40 mEq at 06/16/16 0940  . sodium chloride flush (NS) 0.9 % injection 10-40 mL  10-40 mL Intracatheter PRN Raylene Miyamoto, MD      . white petrolatum (VASELINE) gel   Topical PRN Donita Brooks, NP   1 application at 03/79/55 1214     Discharge Medications: Please see discharge summary for a list of discharge medications.  Relevant Imaging Results:  Relevant Lab Results:   Additional Information SSN: [id number]  Jorge Ny, LCSW

## 2016-06-16 NOTE — Progress Notes (Signed)
   Subjective:    Patient ID: [name] [name], female    DOB: 1981/07/01, 35 y.o.   MRN: [id number]  Patient on ventilator but now on step-down unit.  Tracheostomy functioning well.      Review of Systems     Objective:   Physical Exam  Alert.  NAD.  Mechanical ventilation via tracheostomy.  Tube functioning well.      Assessment & Plan:  Respiratory failure, obstructive sleep apnea  Sutures removed from tracheostomy tube.  Continue use of Proximal extended length Shiley tube.  Can change to cuffless tube when she is clear from ventilator.  I anticipate keeping her tracheostomy tube in place for a while to help treat her sleep apnea until other effective treatment can be established.

## 2016-06-16 NOTE — Care Management Note (Signed)
Case Management Note  Patient Details  Name: [name] [name] MRN: [id number] Date of Birth: [birth year]  Subjective/Objective:    CM and CSW met with pt and mother @ bedside.  Discussed process and requirements for pt to discharge home on TC with nocturnal vent.  Pt's mother works and is also taking care of pt's 37 year-old daughter.  CSW provided information about vent SNF and mother told pt she didn't feel she could take care of her at home, asked pt to agree to vent SNF placement.  Pt stated "I'll think about it."                    Expected Discharge Plan:  [place]  In-House Referral:  Clinical Social Work  Discharge planning Services  CM Consult  Status of Service:  In process, will continue to follow  Girard Cooter, RN 06/16/2016, 2:01 PM

## 2016-06-17 DIAGNOSIS — N179 Acute kidney failure, unspecified: Secondary | ICD-10-CM

## 2016-06-17 DIAGNOSIS — K761 Chronic passive congestion of liver: Secondary | ICD-10-CM

## 2016-06-17 DIAGNOSIS — I5021 Acute systolic (congestive) heart failure: Secondary | ICD-10-CM

## 2016-06-17 LAB — GLUCOSE, CAPILLARY
Glucose-Capillary: 120 mg/dL — ABNORMAL HIGH (ref 65–99)
Glucose-Capillary: 189 mg/dL — ABNORMAL HIGH (ref 65–99)
Glucose-Capillary: 126 mg/dL — ABNORMAL HIGH (ref 65–99)
Glucose-Capillary: 120 mg/dL — ABNORMAL HIGH (ref 65–99)

## 2016-06-17 MED ORDER — PRO-STAT SUGAR FREE PO LIQD
30.0000 mL | Freq: Every day | ORAL | Status: DC
  Administered 2016-06-18 – 2016-06-26 (×9): 30 mL
  Filled 2016-06-17 (×8): qty 30

## 2016-06-17 MED ORDER — GLUCERNA 1.2 CAL PO LIQD
1000.0000 mL | ORAL | Status: DC
  Administered 2016-06-17 – 2016-06-24 (×6): 1000 mL
  Filled 2016-06-17 (×13): qty 1000

## 2016-06-17 NOTE — Progress Notes (Signed)
[name] [name]  [name] [name]  TML:[id number] DOB: April 08, 1981 DOA: 05/25/2016 PCP: No PCP Per Patient    Brief Narrative:  [name] with Hx of morbid obesity (469 lbs), DM, HTN and tachycardia who presented to [facility] on 10/3 with acute on chronic respiratory failure with hypercarbia and hypoxemia due to diastolic CHF exacerbation and obesity hypoventilation syndrome.  Ultimately required tracheostomy due to prolonged respiratory failure. Diuresed over 100 lbs.   Significant Events: 10/03  Admit 10/05  Transferred to ICU due to AMS, hypercarbic respiratory failure 10/06  Bipap, ntg gtt & BP remains elevated in 681'E systolic.  Net neg 5.5 L in last 24 hours with improvement in sr cr 10/07  intubated overnight .  10/07 -10/14 diuresed aggressively 10/14  Attempting wean 10/15  Weaning 10/18  febrile 10/21  Awake, following commands, PSV   Subjective: Sitting up in a chair.  Tells me she is anxious to have her NG tube removed.  Denies chest pain or shortness of breath.   Assessment & Plan:  Acute on Chronic Hypoxemic and Hypercarbic Respiratory Failure  S/p tracheostomy - ongoing vent care per PCCM - currently on TC during day and vent each night    Acute diastolic heart failure Net negative ~13.6L since admit - diuretic being dosed by Cards - weights appear to be inaccurate   Filed Weights   06/15/16 0500 06/16/16 0500 06/17/16 0406  Weight: 130 kg (286 lb 8 oz) (!) 162.8 kg (359 lb) (!) 137.9 kg (304 lb)    Secondary pulmonary hypertension As per CHF tx plan   Hypertensive Urgency Resolved - BP currently well controlled   Severe OSA / OHS with acute decompensation  To continue QHS vent support - PCCM directing care   AKI / Azotemia 2/2 diuresis Renal function has been improving - f/u in AM   ?HCAP fevers, purulent secretions, sub-optimal CXR r/t body habitus - completed 6 days of empiric abx tx    Demand ischemia Cards following   Hypernatremia   Improving - follow   Hypokalemia Corrected  Morbid obesity - Body mass index is 53.85 kg/m.  Altered Mental Status Appears resolved at this time w/ pt alert and cheerful - follow   Depression family reports pt is in "denial" - seen by Psych 05/28/16  DVT prophylaxis: lovenox  Code Status: FULL CODE Family Communication: No family present at time of exam today  Disposition Plan: SDU  Consultants:  PCCM Cardiology   Antimicrobials:  Vanc 10/18 - 10/22 Zosyn 10/18 - 10/23  Objective: Blood pressure 119/70, pulse 96, temperature 98.5 F (36.9 C), temperature source Oral, resp. rate 20, height _0  (1.6 m), weight (!) 137.9 kg (304 lb), SpO2 94 %, unknown if currently breastfeeding.  Intake/Output Summary (Last 24 hours) at 06/17/16 1637 Last data filed at 06/17/16 1606  Gross per 24 hour  Intake           926.67 ml  Output             1275 ml  Net          -348.33 ml   Filed Weights   06/15/16 0500 06/16/16 0500 06/17/16 0406  Weight: 130 kg (286 lb 8 oz) (!) 162.8 kg (359 lb) (!) 137.9 kg (304 lb)    Examination: General: No acute respiratory distress  Lungs: distant breath sounds th/o - no wheeze Cardiovascular: Regular rate and rhythm  Abdomen: Nontender, morbidly obese, soft Extremities: 2+ edema bilateral  lower extremities w/ chronic venous stasis dermatitis   CBC:  Recent Labs Lab 06/11/16 0500 06/12/16 0501 06/14/16 0300 06/15/16 0520 06/16/16 0500  WBC 7.5 7.6 6.5 6.2 6.1  HGB 14.1 14.7 14.0 13.8 13.9  HCT 48.9* 51.3* 49.4* 48.3* 48.4*  MCV 85.5 85.1 84.9 84.9 83.7  PLT 170 159 180 163 009   Basic Metabolic Panel:  Recent Labs Lab 06/11/16 0500 06/12/16 0501 06/14/16 0300 06/15/16 0520 06/16/16 0500  NA 157* 157* 160* 154* 155*  K 3.7 3.2* 3.8 3.9 4.2  CL 113* 117* 120* 112* 112*  CO2 35* 32 31 31 33*  GLUCOSE 130* 125* 116* 127* 111*  BUN 72* 50* 48* 49* 42*  CREATININE 1.12* 0.93 0.88 1.14* 1.02*  CALCIUM 9.6 10.0 9.9 9.7 9.9    MG 2.7* 2.4  --  2.3 2.2  PHOS 4.5 2.6  --  4.9*  --    GFR: Estimated Creatinine Clearance: 104.2 mL/min (by C-G formula based on SCr of 1.02 mg/dL (H)).  Liver Function Tests:  Recent Labs Lab 06/16/16 0500  AST 35  ALT 27  ALKPHOS 80  BILITOT 1.2  PROT 7.4  ALBUMIN 2.8*   HbA1C: Hgb A1c MFr Bld  Date/Time Value Ref Range Status  05/25/2016 09:48 PM 7.9 (H) 4.8 - 5.6 % Final    Comment:    (NOTE)         Pre-diabetes: 5.7 - 6.4         Diabetes: >6.4         Glycemic control for adults with diabetes: <7.0    CBG:  Recent Labs Lab 06/16/16 1916 06/16/16 2333 06/17/16 0812 06/17/16 1215 06/17/16 1558  GLUCAP 133* 135* 120* 189* 126*    Recent Results (from the past 240 hour(s))  Culture, respiratory (NON-Expectorated)     Status: None   Collection Time: 06/08/16 11:05 AM  Result Value Ref Range Status   Specimen Description TRACHEAL ASPIRATE  Final   Special Requests Normal  Final   Gram Stain   Final    MODERATE WBC PRESENT,BOTH PMN AND MONONUCLEAR MODERATE GRAM VARIABLE ROD FEW GRAM POSITIVE COCCI IN PAIRS    Culture Consistent with normal respiratory flora.  Final   Report Status 06/10/2016 FINAL  Final  Culture, blood (routine x 2)     Status: None   Collection Time: 06/09/16 10:30 AM  Result Value Ref Range Status   Specimen Description BLOOD LEFT HAND  Final   Special Requests BOTTLES DRAWN AEROBIC ONLY 10 CC  Final   Culture NO GROWTH 5 DAYS  Final   Report Status 06/14/2016 FINAL  Final  Culture, blood (routine x 2)     Status: None   Collection Time: 06/09/16 10:45 AM  Result Value Ref Range Status   Specimen Description BLOOD RIGHT HAND  Final   Special Requests BOTTLES DRAWN AEROBIC ONLY 10 CC  Final   Culture NO GROWTH 5 DAYS  Final   Report Status 06/14/2016 FINAL  Final  Culture, respiratory (NON-Expectorated)     Status: None   Collection Time: 06/10/16  9:32 AM  Result Value Ref Range Status   Specimen Description TRACHEAL  ASPIRATE  Final   Special Requests NONE  Final   Gram Stain   Final    ABUNDANT WBC PRESENT, PREDOMINANTLY PMN RARE GRAM POSITIVE COCCI IN PAIRS RARE GRAM POSITIVE RODS    Culture Consistent with normal respiratory flora.  Final   Report Status 06/12/2016 FINAL  Final  Scheduled Meds: . amLODipine  10 mg Per Tube Daily  . chlorhexidine gluconate (MEDLINE KIT)  15 mL Mouth Rinse BID  . enoxaparin (LOVENOX) injection  80 mg Subcutaneous Q24H  . famotidine (PEPCID) IV  20 mg Intravenous Q12H  . [START ON 06/18/2016] feeding supplement (PRO-STAT SUGAR FREE 64)  30 mL Per Tube Daily  . free water  250 mL Per Tube Q4H  . furosemide  60 mg Intravenous Daily  . hydrALAZINE  75 mg Oral Q8H  . isosorbide dinitrate  30 mg Per Tube BID  . labetalol  200 mg Per Tube BID  . mouth rinse  15 mL Mouth Rinse Q4H  . potassium chloride  40 mEq Per Tube BID   Continuous Infusions: . feeding supplement (GLUCERNA 1.2 CAL)       LOS: 23 days   Cherene Altes, MD [facility] Office  [phone] Pager - Text Page per Shea Evans as per below:  On-Call/Text Page:      [email or web address]      password [email or web address]  If 7PM-7AM, please contact night-coverage [email or web address] Password [facility] 06/17/2016, 4:37 PM

## 2016-06-17 NOTE — Evaluation (Signed)
Clinical/Bedside Swallow Evaluation Patient Details  Name: [name] [name] MRN: [id number] Date of Birth: 1981/04/05  Today's Date: 06/17/2016 Time: SLP Start Time (ACUTE ONLY): 1618 SLP Stop Time (ACUTE ONLY): 1628 SLP Time Calculation (min) (ACUTE ONLY): 10 min  Past Medical History:  Past Medical History:  Diagnosis Date  . Diabetes mellitus without complication (HCC)    GDM  . Gestational diabetes mellitus in pregnancy 05/24/2013  . HTN in pregnancy, chronic 05/24/2013  . Hypertension   . Morbid obesity ([place])   . Sinus tachycardia    Past Surgical History:  Past Surgical History:  Procedure Laterality Date  . TRACHEOSTOMY TUBE PLACEMENT N/A 06/11/2016   Procedure: TRACHEOSTOMY;  Surgeon: Melida Quitter, MD;  Location: [facility] OR;  Service: ENT;  Laterality: N/A;   HPI:  [name] with PMH of morbid obesity (469 lbs), DM, HTN and tachycardia who presented to [facility] on 10/3 with acute on chronic respiratory failure with hypercarbia and hypoxemia due to diastolic CHF exacerbation and obesity hypoventilation syndrome.  Ultimately required tracheostomy due to prolonged respiratory failure. Diuresed over 100 lbs.    Assessment / Plan / Recommendation Clinical Impression  Bedside swallow evaluation complete to determine readiness for instrumental testing. Patient able to self feed, demonstrating appropriate oral transit of bolus and initiation of swallow but with overt indication of aspiration characterized by cough post swallow in approximately 50% of swallows. Recommend FEES in am 10/27 to determine potential to resume a po diet.     Aspiration Risk       Diet Recommendation NPO   Medication Administration: Via alternative means    Other  Recommendations Oral Care Recommendations: Oral care QID      Frequency and Duration min 3x week          Prognosis        Swallow Study   General HPI: [name] with PMH of morbid obesity (469 lbs), DM, HTN and tachycardia who presented to [facility] on  10/3 with acute on chronic respiratory failure with hypercarbia and hypoxemia due to diastolic CHF exacerbation and obesity hypoventilation syndrome.  Ultimately required tracheostomy due to prolonged respiratory failure. Diuresed over 100 lbs.  Type of Study: Bedside Swallow Evaluation Previous Swallow Assessment: none Diet Prior to this Study: NPO;NG Tube Temperature Spikes Noted: No Respiratory Status: Trach;Trach Collar Trach Size and Type: #6;Extra long;With PMSV in place History of Recent Intubation: Yes Date extubated: 06/10/16 Behavior/Cognition: Alert;Pleasant mood;Cooperative Oral Cavity Assessment: Within Functional Limits Oral Care Completed by SLP: Yes Oral Cavity - Dentition: Adequate natural dentition Vision: Functional for self-feeding Self-Feeding Abilities: Able to feed self Patient Positioning: Upright in chair Baseline Vocal Quality: Aphonic Volitional Cough: Strong Volitional Swallow: Able to elicit    Oral/Motor/Sensory Function Overall Oral Motor/Sensory Function: Within functional limits   Ice Chips Ice chips: Impaired Presentation: Spoon;Self Fed Pharyngeal Phase Impairments: Cough - Immediate   Thin Liquid Thin Liquid: Not tested    Nectar Thick Nectar Thick Liquid: Not tested   Honey Thick Honey Thick Liquid: Not tested   Puree Puree: Not tested   Solid   GO  [name] Strawderman MA, CCC-SLP [phone]  Solid: Not tested        [name] [name] Meryl 06/17/2016,4:38 PM

## 2016-06-17 NOTE — Progress Notes (Signed)
Physical Therapy Treatment Patient Details Name: [name] [name] MRN: [id number] DOB: [birth year] Today's Date: 06/17/2016    History of Present Illness [name] with PMH of morbid obesity (469 lbs), DM, HTN and tachycardia who presented to [facility] on 10/3 with reports of abdominal bloating and heavy abdominal pressure.  Intubated 05/29/16 to present, trached 06/10/16.    PT Comments    Pt admitted with above diagnosis. Pt currently with functional limitations due to the deficits listed below (see PT Problem List). Pt was able to transfer to recliner scoot laterally with +2 max assist.  Pt giving a lot of effort.  Will continue to progress pt as able.  Pt will benefit from skilled PT to increase their independence and safety with mobility to allow discharge to the venue listed below.    Follow Up Recommendations  LTACH;Supervision/Assistance - 24 hour     Equipment Recommendations  Other (comment) (TBA)    Recommendations for Other Services       Precautions / Restrictions Precautions Precautions: Fall Precaution Comments: trach collar Restrictions Weight Bearing Restrictions: No Other Position/Activity Restrictions: Unable to get sara stedy under Vital Go bed to attempt stand pivot    Mobility  Bed Mobility Overal bed mobility: Needs Assistance Bed Mobility: Supine to Sit;Rolling Rolling: Max assist   Supine to sit: Mod assist;+2 for physical assistance     General bed mobility comments: Moved pt up with +2 mod assist, pt pulling up on headboard and pushing with LEs, assist for LEs to EOB and to raise trunk.  Had to change pads, pt dressing and clean pt as her flexi seal was leaking.   Transfers Overall transfer level: Needs assistance   Transfers: Lateral/Scoot Transfers          Lateral/Scoot Transfers: +2 physical assistance;Max assist General transfer comment: assist of pad to move toward L to drop arm recliner, cues for use of UEs and to weight shift anterior.  Took  incr time to get pt positioned well in chair as she could not move as well once on pillow on chair. Had to call 3rd person in to get pt positioned.    Ambulation/Gait                 Stairs            Wheelchair Mobility    Modified Rankin (Stroke Patients Only)       Balance Overall balance assessment: Needs assistance Sitting-balance support: No upper extremity supported;Feet supported Sitting balance-Leahy Scale: Fair Sitting balance - Comments: sat EOB with min guard assist x 15-20 minutes before transferring to chair                            Cognition Arousal/Alertness: Awake/alert Behavior During Therapy: WFL for tasks assessed/performed Overall Cognitive Status: Within Functional Limits for tasks assessed                 General Comments: Pt attending during UB exercises to keep count of repetitions. Worked on placing call with house phone.Pt oriented to month and day.      Exercises General Exercises - Upper Extremity Shoulder Flexion: Strengthening;Both;15 reps;Bar weights/barbell Shoulder Horizontal ABduction: Strengthening;Both;10 reps;Bar weights/barbell Shoulder Horizontal ADduction: Strengthening;Both;10 reps;Bar weights/barbell Elbow Flexion: Strengthening;Both;10 reps;Bar weights/barbell Elbow Extension: Strengthening;Both;10 reps;Bar weights/barbell General Exercises - Lower Extremity Ankle Circles/Pumps: AROM;Both;5 reps;Supine Long Arc Quad: AROM;Both;10 reps;Seated    General Comments        Pertinent  Vitals/Pain Pain Assessment: 0-10 Pain Score: 8  Pain Location: rectum Pain Descriptors / Indicators: Grimacing;Guarding;Sore Pain Intervention(s): Monitored during session;Limited activity within patient's tolerance;Repositioned  VSS on 40% trach collar    Home Living                      Prior Function            PT Goals (current goals can now be found in the care plan section) Acute Rehab PT  Goals Patient Stated Goal: to get her trach removed Progress towards PT goals: Progressing toward goals    Frequency    Min 3X/week      PT Plan Current plan remains appropriate    Co-evaluation PT/OT/SLP Co-Evaluation/Treatment: Yes Reason for Co-Treatment: For patient/therapist safety PT goals addressed during session: Mobility/safety with mobility OT goals addressed during session: Strengthening/ROM;ADL's and self-care     End of Session Equipment Utilized During Treatment: Oxygen (trach collar 40%) Activity Tolerance: Patient limited by fatigue Patient left: with call bell/phone within reach;with family/visitor present;in chair;with chair alarm set (in chair position at 50 degrees)     Time: 2482-5003 PT Time Calculation (min) (ACUTE ONLY): 82 min  Charges:  $Therapeutic Exercise: 8-22 mins $Therapeutic Activity: 8-22 mins $Self Care/Home Management: 8-22                    G Codes:      Filomeno Cromley F 2016/06/21, 1:16 PM  Gianni Fuchs,PT Acute Rehabilitation [phone] (pager)

## 2016-06-17 NOTE — Progress Notes (Signed)
Occupational Therapy Treatment Patient Details Name: [name] [name] MRN: [id number] DOB: [birth year] Today's Date: 06/17/2016    History of present illness [name] with PMH of morbid obesity (469 lbs), DM, HTN and tachycardia who presented to [facility] on 10/3 with reports of abdominal bloating and heavy abdominal pressure.  Intubated 05/29/16 to present, trached 06/10/16.   OT comments  Pt able to laterally scoot from bed to drop arm recliner with +2 max assist. Pt pleased to be OOB. Able to write, attempt to place a phone call her her family and perform grooming activities once in chair. Cognition continues to improve. VS monitored throughout, pt tolerating activity well. Pt has good rehab potential.  Follow Up Recommendations  LTACH;Supervision/Assistance - 24 hour    Equipment Recommendations       Recommendations for Other Services      Precautions / Restrictions Precautions Precautions: Fall Precaution Comments: trach collar Restrictions Weight Bearing Restrictions: No Other Position/Activity Restrictions: Unable to get sara stedy under Vital Go bed to attempt stand pivot       Mobility Bed Mobility Overal bed mobility: Needs Assistance Bed Mobility: Supine to Sit;Rolling Rolling: Max assist   Supine to sit: Mod assist;+2 for physical assistance     General bed mobility comments: Moved pt up with +2 mod assist, pt pulling up on headboard and pushing with LEs, assist for LEs to EOB and to raise trunk  Transfers Overall transfer level: Needs assistance   Transfers: Lateral/Scoot Transfers          Lateral/Scoot Transfers: +2 physical assistance;Max assist General transfer comment: assist of pad to move toward L to drop arm recliner, cues for use of UEs and to weight shift anterior    Balance     Sitting balance-Leahy Scale: Fair Sitting balance - Comments: sat EOB with min guard assist x 15-20 minutes before transferring to chair                          ADL Overall ADL's : Needs assistance/impaired     Grooming: Wash/dry hands;Sitting;Set up (applied lotion to hands)                       Toileting- Clothing Manipulation and Hygiene: Total assistance;Bed level Toileting - Clothing Manipulation Details (indicate cue type and reason): performed pericare       General ADL Comments: Pt able to communicate using whiteboard once up in chair.      Vision                     Perception     Praxis      Cognition   Behavior During Therapy: [facility] for tasks assessed/performed Overall Cognitive Status: Within Functional Limits for tasks assessed                  General Comments: Pt attending during UB exercises to keep count of repetitions. Worked on placing call with house phone.    Extremity/Trunk Assessment               Exercises General Exercises - Upper Extremity Shoulder Flexion: Strengthening;Both;15 reps;Bar weights/barbell Shoulder Horizontal ABduction: Strengthening;Both;10 reps;Bar weights/barbell Shoulder Horizontal ADduction: Strengthening;Both;10 reps;Bar weights/barbell Elbow Flexion: Strengthening;Both;10 reps;Bar weights/barbell Elbow Extension: Strengthening;Both;10 reps;Bar weights/barbell   Shoulder Instructions       General Comments      Pertinent Vitals/ Pain       Pain Assessment: 0-10  Pain Score: 8  Pain Location: rectum Pain Descriptors / Indicators: Grimacing;Guarding;Sore Pain Intervention(s): Monitored during session;Repositioned  Home Living                                          Prior Functioning/Environment              Frequency  Min 2X/week        Progress Toward Goals  OT Goals(current goals can now be found in the care plan section)  Progress towards OT goals: Progressing toward goals  Acute Rehab OT Goals Patient Stated Goal: to get her trach removed Time For Goal Achievement: 06/23/16 Potential to Achieve Goals:  Good  Plan Frequency remains appropriate    Co-evaluation    PT/OT/SLP Co-Evaluation/Treatment: Yes     OT goals addressed during session: Strengthening/ROM;ADL's and self-care      End of Session     Activity Tolerance     Patient Left with call bell/phone within reach;in chair;with chair alarm set   Nurse Communication Need for lift equipment        Time: 7225-7505 OT Time Calculation (min): 87 min  Charges: OT General Charges $OT Visit: 1 Procedure OT Treatments $Self Care/Home Management : 8-22 mins $Therapeutic Activity: 23-37 mins $Therapeutic Exercise: 8-22 mins  Malka So 06/17/2016, 11:35 AM  [phone]

## 2016-06-17 NOTE — Progress Notes (Addendum)
Nutrition Follow-up  DOCUMENTATION CODES:   Morbid obesity  INTERVENTION:    D/C Vital High Protein formula   Initiate Glucerna 1.2 formula at 25 ml/hr and increase by 10 ml every 4 hours to goal rate of 55 ml/hr   Prostat liquid protein 30 ml daily via tube  Total TF regimen to provide 1684 kcals, 101 gm protein, 1063 ml of free water  NUTRITION DIAGNOSIS:   Inadequate oral intake related to inability to eat as evidenced by NPO status, ongoing  GOAL:   Patient will meet greater than or equal to 90% of their needs, met  MONITOR:   Diet advancement, TF tolerance, Vent status, Labs, Weight trends, I & O's  ASSESSMENT:   [name] with PMH of morbid obesity (469 lbs), DM, HTN and tachycardia who presented to [facility] on 10/3 with reports of abdominal bloating and heavy abdominal pressure.    Pt now on trach collar. Vital High Protein formula infusing at goal rate of 25 ml/hr via large bore NGT. Also receiving Prostat liquid protein 60 ml TID. Free water flushes at 250 ml every 4 hours. Speech Path consulted >> swallow evaluation pending.  Diet Order:  Diet NPO time specified  Skin:  Wound (see comment) (wound on R/L tibial)  Last BM:  10/25  CBG (last 3)   Recent Labs  06/16/16 2333 06/17/16 0812 06/17/16 1215  GLUCAP 135* 120* 189*   Height:   Ht Readings from Last 1 Encounters:  06/08/16 5' 3" (1.6 m)    Weight:   Wt Readings from Last 1 Encounters:  06/17/16 (!) 304 lb (137.9 kg)    Ideal Body Weight:  52.27 kg  BMI:  Body mass index is 53.85 kg/m.  Estimated Nutritional Needs:   Kcal:  1600-1800  Protein:  95-105 gm  Fluid:  1.6-1.8 L  EDUCATION NEEDS:   No education needs identified at this time  Arthur Holms, RD, LDN Pager #: [phone] After-Hours Pager #: [phone]

## 2016-06-17 NOTE — Progress Notes (Signed)
Desaturation noted overnight - BP and HR improved today. CXR reviewed, looks like worsening pulmonary edema - back on the vent. Remains negative volume status. Continue current dose lasix. No further cardiac suggestions at this point - working toward [place].  Will sign-off. Call with questions.  Pixie Casino, MD, [facility] Attending Cardiologist [place]

## 2016-06-17 NOTE — Progress Notes (Signed)
Patient was moved from bed to chair with sara lift by PT this a.m.  Encouraged patient to return to bed with assistance.  Patient desired to remain in chair; allowed patient to remain sitting up in chair.  Encouraged patient to call when she was ready to return to bed.

## 2016-06-17 NOTE — Evaluation (Signed)
[facility] Speaking Valve - Evaluation Patient Details  Name: Stephana Morell MRN: [id number] Date of Birth: 1981-01-26  Today's Date: 06/17/2016 Time: 0354-6568 SLP Time Calculation (min) (ACUTE ONLY): 10 min  Past Medical History:  Past Medical History:  Diagnosis Date  . Diabetes mellitus without complication (HCC)    GDM  . Gestational diabetes mellitus in pregnancy 05/24/2013  . HTN in pregnancy, chronic 05/24/2013  . Hypertension   . Morbid obesity ([employer])   . Sinus tachycardia    Past Surgical History:  Past Surgical History:  Procedure Laterality Date  . TRACHEOSTOMY TUBE PLACEMENT N/A 06/11/2016   Procedure: TRACHEOSTOMY;  Surgeon: Melida Quitter, MD;  Location: [facility] OR;  Service: ENT;  Laterality: N/A;   HPI:  [name] with PMH of morbid obesity (469 lbs), DM, HTN and tachycardia who presented to [facility] on 10/3 with acute on chronic respiratory failure with hypercarbia and hypoxemia due to diastolic CHF exacerbation and obesity hypoventilation syndrome.  Ultimately required tracheostomy due to prolonged respiratory failure. Diuresed over 100 lbs.    Assessment / Plan / Recommendation Clinical Impression  Patient with great tolerance of PMSV. Vital signs remained WFL and patient without evidence of distress or air trapping with valve removal. Patient remained largely aphonic with occassional breathy phonatory output suggestive of decreased adduction of vocal cords. SLP will continue to f/u.     SLP Assessment  Patient needs continued Speech Lanaguage Pathology Services    Follow Up Recommendations       Frequency and Duration min 3x week  2 weeks    PMSV Trial PMSV was placed for: 20 minutes Able to redirect subglottic air through upper airway: Yes Able to Attain Phonation: Yes Voice Quality: Aphonic;Breathy Able to Expectorate Secretions: Yes Level of Secretion Expectoration with PMSV: Oral Breath Support for Phonation: Moderately decreased Intelligibility:  Intelligibility reduced Word: 25-49% accurate Phrase: 25-49% accurate Sentence: 25-49% accurate Conversation: 25-49% accurate SpO2 During Trial: 96 % Pulse During Trial: 94 Behavior: Alert;Controlled;Cooperative;Expresses self well   Tracheostomy Tube       Vent Dependency  FiO2 (%): 40 %    Cuff Deflation Trial  GO Tolerated Cuff Deflation: Yes Length of Time for Cuff Deflation Trial: 20 minutes Behavior: Alert;Controlled;Cooperative;Expresses self well   Gabriel Rainwater [place], CCC-SLP [phone]        Gabriel Rainwater Meryl 06/17/2016, 4:36 PM

## 2016-06-18 LAB — BASIC METABOLIC PANEL
Anion gap: 11 (ref 5–15)
BUN: 51 mg/dL — ABNORMAL HIGH (ref 6–20)
CO2: 31 mmol/L (ref 22–32)
Calcium: 10 mg/dL (ref 8.9–10.3)
Chloride: 107 mmol/L (ref 101–111)
Creatinine, Ser: 1.11 mg/dL — ABNORMAL HIGH (ref 0.44–1.00)
GFR calc Af Amer: >60 mL/min (ref >60)
GFR calc non Af Amer: >60 mL/min (ref >60)
Glucose, Bld: 110 mg/dL — ABNORMAL HIGH (ref 65–99)
Potassium: 4.3 mmol/L (ref 3.5–5.1)
Sodium: 149 mmol/L — ABNORMAL HIGH (ref 135–145)

## 2016-06-18 LAB — GLUCOSE, CAPILLARY
Glucose-Capillary: 126 mg/dL — ABNORMAL HIGH (ref 65–99)
Glucose-Capillary: 139 mg/dL — ABNORMAL HIGH (ref 65–99)
Glucose-Capillary: 160 mg/dL — ABNORMAL HIGH (ref 65–99)
Glucose-Capillary: 113 mg/dL — ABNORMAL HIGH (ref 65–99)
Glucose-Capillary: 118 mg/dL — ABNORMAL HIGH (ref 65–99)

## 2016-06-18 MED ORDER — HYDRALAZINE HCL 50 MG PO TABS
100.0000 mg | ORAL_TABLET | Freq: Three times a day (TID) | ORAL | Status: DC
  Administered 2016-06-18 – 2016-06-29 (×33): 100 mg via ORAL
  Filled 2016-06-18 (×34): qty 2

## 2016-06-18 MED ORDER — ENOXAPARIN SODIUM 80 MG/0.8ML ~~LOC~~ SOLN
0.5000 mg/kg | SUBCUTANEOUS | Status: DC
  Administered 2016-06-18: 70 mg via SUBCUTANEOUS
  Filled 2016-06-18: qty 0.7

## 2016-06-18 MED ORDER — FAMOTIDINE 40 MG/5ML PO SUSR
20.0000 mg | Freq: Two times a day (BID) | ORAL | Status: DC
  Administered 2016-06-18 – 2016-06-25 (×15): 20 mg
  Filled 2016-06-18 (×16): qty 2.5

## 2016-06-18 MED ORDER — FAMOTIDINE 40 MG/5ML PO SUSR
20.0000 mg | Freq: Two times a day (BID) | ORAL | Status: DC
  Filled 2016-06-18: qty 2.5

## 2016-06-18 MED ORDER — POTASSIUM CHLORIDE 20 MEQ/15ML (10%) PO SOLN
20.0000 meq | Freq: Two times a day (BID) | ORAL | Status: DC
  Administered 2016-06-18 – 2016-06-25 (×14): 20 meq
  Filled 2016-06-18 (×14): qty 15

## 2016-06-18 NOTE — Procedures (Signed)
Objective Swallowing Evaluation: Type of Study: FEES-Fiberoptic Endoscopic Evaluation of Swallow  Patient Details  Name: [name] [name] MRN: [id number] Date of Birth: 08/14/1981  Today's Date: 06/18/2016 Time: SLP Start Time (ACUTE ONLY): 0945-SLP Stop Time (ACUTE ONLY): 1025 SLP Time Calculation (min) (ACUTE ONLY): 40 min  Past Medical History:  Past Medical History:  Diagnosis Date  . Diabetes mellitus without complication (HCC)    GDM  . Gestational diabetes mellitus in pregnancy 05/24/2013  . HTN in pregnancy, chronic 05/24/2013  . Hypertension   . Morbid obesity ([place])   . Sinus tachycardia    Past Surgical History:  Past Surgical History:  Procedure Laterality Date  . TRACHEOSTOMY TUBE PLACEMENT N/A 06/11/2016   Procedure: TRACHEOSTOMY;  Surgeon: Melida Quitter, MD;  Location: [facility] OR;  Service: ENT;  Laterality: N/A;   HPI: [name] with PMH of morbid obesity (469 lbs), DM, HTN and tachycardia who presented to [facility] on 10/3 with acute on chronic respiratory failure with hypercarbia and hypoxemia due to diastolic CHF exacerbation and obesity hypoventilation syndrome.  Ultimately required tracheostomy due to prolonged respiratory failure. Diuresed over 100 lbs.  ETT 10/7 - 10/21; trach xl#6 10/21  Subjective: pleasant, using PMV for study   Assessment / Plan / Recommendation  CHL IP CLINICAL IMPRESSIONS 06/18/2016  Therapy Diagnosis Moderate pharyngeal phase dysphagia  Clinical Impression Pt presents with a moderate-severe pharyngeal dysphagia.  PMV in place for study.  Larynx is edematous; standing secretions in laryngeal vestibule; poorly visible vocal folds.  Pt demonstrates swallow delay to the level of the pyriforms; material begins to spill into vestibule before swallow is triggered, penetrating to the vocal folds.  A weak cough ejects penetrates, which then fall back into the larynx with trace aspiration of POs mixed with standing secretions. Pt is not ready for PO diet- we  reviewed results; pt watched video after exam and we discussed recommendations.  She is disappointed, but prognosis for recovery is good. Continue NPO and enteral feeding.  SLP will follow for PO readiness, PMV use.   Impact on safety and function Moderate aspiration risk      CHL IP TREATMENT RECOMMENDATION 06/18/2016  Treatment Recommendations Therapy as outlined in treatment plan below     Prognosis 06/18/2016  Prognosis for Safe Diet Advancement Good  Barriers to Reach Goals --  Barriers/Prognosis Comment --    CHL IP DIET RECOMMENDATION 06/18/2016  SLP Diet Recommendations NPO;Alternative means - temporary  Liquid Administration via --  Medication Administration --  Compensations --  Postural Changes --      No flowsheet data found.    CHL IP FOLLOW UP RECOMMENDATIONS 06/18/2016  Follow up Recommendations (No Data)      CHL IP FREQUENCY AND DURATION 06/18/2016  Speech Therapy Frequency (ACUTE ONLY) min 3x week  Treatment Duration 2 weeks           CHL IP ORAL PHASE 06/18/2016  Oral Phase WFL  Oral - Pudding Teaspoon --  Oral - Pudding Cup --  Oral - Honey Teaspoon --  Oral - Honey Cup --  Oral - Nectar Teaspoon --  Oral - Nectar Cup --  Oral - Nectar Straw --  Oral - Thin Teaspoon --  Oral - Thin Cup --  Oral - Thin Straw --  Oral - Puree --  Oral - Mech Soft --  Oral - Regular --  Oral - Multi-Consistency --  Oral - Pill --  Oral Phase - Comment --    CHL IP  PHARYNGEAL PHASE 06/18/2016  Pharyngeal Phase Impaired  Pharyngeal- Pudding Teaspoon --  Pharyngeal --  Pharyngeal- Pudding Cup --  Pharyngeal --  Pharyngeal- Honey Teaspoon Delayed swallow initiation-pyriform sinuses;Reduced pharyngeal peristalsis;Reduced airway/laryngeal closure;Penetration/Aspiration during swallow;Penetration/Apiration after swallow;Trace aspiration  Pharyngeal Material enters airway, CONTACTS cords and not ejected out  Pharyngeal- Honey Cup --  Pharyngeal --  Pharyngeal-  Nectar Teaspoon --  Pharyngeal --  Pharyngeal- Nectar Cup --  Pharyngeal --  Pharyngeal- Nectar Straw --  Pharyngeal --  Pharyngeal- Thin Teaspoon Reduced airway/laryngeal closure;Penetration/Aspiration during swallow;Penetration/Apiration after swallow;Delayed swallow initiation-pyriform sinuses  Pharyngeal Material enters airway, CONTACTS cords and not ejected out;Material enters airway, passes BELOW cords without attempt by patient to eject out (silent aspiration)  Pharyngeal- Thin Cup --  Pharyngeal --  Pharyngeal- Thin Straw --  Pharyngeal --  Pharyngeal- Puree Delayed swallow initiation-pyriform sinuses;Reduced airway/laryngeal closure;Penetration/Aspiration during swallow;Penetration/Apiration after swallow  Pharyngeal Material enters airway, CONTACTS cords and not ejected out  Pharyngeal- Mechanical Soft --  Pharyngeal --  Pharyngeal- Regular --  Pharyngeal --  Pharyngeal- Multi-consistency --  Pharyngeal --  Pharyngeal- Pill --  Pharyngeal --  Pharyngeal Comment --     No flowsheet data found.  No flowsheet data found.  Juan Quam Laurice 06/18/2016, 10:37 AM   Park Pope. Tivis Ringer, [place] CCC/SLP Pager [phone]

## 2016-06-18 NOTE — Progress Notes (Signed)
Utilization review completed.  Rayburn Ma RN, BSN, CM

## 2016-06-18 NOTE — Progress Notes (Signed)
   06/18/16 1300  PT Visit Information  Last PT Received On 06/18/16  Assistance Needed +2  History of Present Illness [name] with PMH of morbid obesity (469 lbs), DM, HTN and tachycardia who presented to [facility] on 10/3 with reports of abdominal bloating and heavy abdominal pressure.  Intubated 05/29/16 to present, trached 06/10/16.  Subjective Data  Subjective Pt with PMV in place but voice still a whisper.  Precautions  Precautions Fall  Precaution Comments trach collar  Restrictions  Weight Bearing Restrictions No  Pain Assessment  Pain Assessment No/denies pain  Cognition  Arousal/Alertness Awake/alert  Behavior During Therapy WFL for tasks assessed/performed  Overall Cognitive Status Within Functional Limits for tasks assessed  Bed Mobility  Overal bed mobility Needs Assistance  Bed Mobility Rolling  Rolling Mod assist  General bed mobility comments Pt can now reach up and move herself up in bed, also pushes up on foot board. ST came in and needed pt to be in sitting position for FEES therefore assisted pt in 50 degree sitting position.  General Comments  General comments (skin integrity, edema, etc.) Pt had to be rolled to clean pt as her flexi seal continues to leak.   PT - End of Session  Equipment Utilized During Treatment Oxygen (trach collar 40%)  Activity Tolerance Patient limited by fatigue  Patient left with call bell/phone within reach;in bed (in chair position at 50 degrees)  Nurse Communication Mobility status;Need for lift equipment  PT - Assessment/Plan  PT Plan Discharge plan needs to be updated  PT Frequency (ACUTE ONLY) Min 3X/week  Follow Up Recommendations SNF;Supervision/Assistance - 24 hour  PT equipment Other (comment) (TBA)  PT Goal Progression  Progress towards PT goals Progressing toward goals  PT Time Calculation  PT Start Time (ACUTE ONLY) 0908  PT Stop Time (ACUTE ONLY) 0943  PT Time Calculation (min) (ACUTE ONLY) 35 min  PT General Charges   $$ ACUTE PT VISIT 1 Procedure  PT Treatments  $Therapeutic Activity 23-37 mins  [name] [name],PT Acute Rehabilitation [phone] (pager)

## 2016-06-18 NOTE — Progress Notes (Signed)
[facility] TEAM 1 - Stepdown/ICU TEAM  [name] [name]  MRN:[id number] DOB: 11/20/1980 DOA: 05/25/2016 PCP: No PCP Per Patient    Brief Narrative:  34 y/o F with Hx of morbid obesity (469 lbs), DM, HTN and tachycardia who presented to [facility] on 10/3 with acute on chronic respiratory failure with hypercarbia and hypoxemia due to diastolic CHF exacerbation and obesity hypoventilation syndrome.  Ultimately required tracheostomy due to prolonged respiratory failure. Diuresed over 100 lbs.   Significant Events: 10/03  Admit 10/05  Transferred to ICU due to AMS, hypercarbic respiratory failure 10/06  Bipap, ntg gtt & BP remains elevated in 150's systolic.  Net neg 5.5 L in last 24 hours with improvement in sr cr 10/07  intubated overnight .  10/07 -10/14 diuresed aggressively 10/14  Attempting wean 10/15  Weaning 10/18  febrile 10/21  Awake, following commands, PSV   Subjective: Resting comfortably in her bed after having worked with physical therapy.  She has no new complaints today.  Unfortunately she did not do well on her swallowing evaluation and has not yet been cleared for oral intake.  Assessment & Plan:  Acute on Chronic Hypoxemic and Hypercarbic Respiratory Failure  S/p tracheostomy - ongoing vent care per PCCM - currently on TC during day and vent each night    Acute diastolic heart failure I/O balance swinging back toward the positive side / now only ~11L net negative - w/ this Na has improved but crt remains elevated - cont to hold diuretic for now - weight not recorded today  Filed Weights   06/15/16 0500 06/16/16 0500 06/17/16 0406  Weight: 130 kg (286 lb 8 oz) (!) 162.8 kg (359 lb) (!) 137.9 kg (304 lb)    Secondary pulmonary hypertension As per CHF tx plan   HTN BP trending up - adjust tx and follow   Severe OSA / OHS with acute decompensation  To continue QHS vent support - PCCM directing care   AKI / Azotemia 2/2 diuresis Creatinine continues to slowly rise -  hold diuresis another 24 hours   ?HCAP fevers, purulent secretions, sub-optimal CXR r/t body habitus - completed 6 days of empiric abx tx    Demand ischemia Cards has completed an evaluation   Hypernatremia  Improving with holding of diuretic - follow  Hypokalemia Corrected  Morbid obesity - Body mass index is 53.85 kg/m.  Altered Mental Status Appears resolved at this time w/ pt alert and cheerful - follow   Depression family reports pt is in "denial" - seen by Psych 05/28/16  DVT prophylaxis: lovenox  Code Status: FULL CODE Family Communication: Spoke with her "godmother" at bedside with her permission Disposition Plan: SDU  Consultants:  PCCM Cardiology   Antimicrobials:  Vanc 10/18 - 10/22 Zosyn 10/18 - 10/23  Objective: Blood pressure 123/78, pulse 80, temperature 97.9 F (36.6 C), temperature source Oral, resp. rate 20, height 5' 3" (1.6 m), weight (!) 137.9 kg (304 lb), SpO2 96 %, unknown if currently breastfeeding.  Intake/Output Summary (Last 24 hours) at 06/18/16 1552 Last data filed at 06/18/16 0600  Gross per 24 hour  Intake           784.58 ml  Output             1120 ml  Net          -335.42 ml   Filed Weights   06/15/16 0500 06/16/16 0500 06/17/16 0406  Weight: 130 kg (286 lb 8 oz) (!) 162.8 kg (359   lb) (!) 137.9 kg (304 lb)    Examination: General: No acute respiratory distress - alert and conversant Lungs: distant breath sounds th/o - no wheeze or appreciable crackles Cardiovascular: Regular rate and rhythm without wheeze Abdomen: Nontender, morbidly obese, soft Extremities: 2+ edema bilateral lower extremities w/ chronic venous stasis dermatitis   CBC:  Recent Labs Lab 06/12/16 0501 06/14/16 0300 06/15/16 0520 06/16/16 0500  WBC 7.6 6.5 6.2 6.1  HGB 14.7 14.0 13.8 13.9  HCT 51.3* 49.4* 48.3* 48.4*  MCV 85.1 84.9 84.9 83.7  PLT 159 180 163 161   Basic Metabolic Panel:  Recent Labs Lab 06/12/16 0501 06/14/16 0300  06/15/16 0520 06/16/16 0500 06/18/16 0500  NA 157* 160* 154* 155* 149*  K 3.2* 3.8 3.9 4.2 4.3  CL 117* 120* 112* 112* 107  CO2 32 31 31 33* 31  GLUCOSE 125* 116* 127* 111* 110*  BUN 50* 48* 49* 42* 51*  CREATININE 0.93 0.88 1.14* 1.02* 1.11*  CALCIUM 10.0 9.9 9.7 9.9 10.0  MG 2.4  --  2.3 2.2  --   PHOS 2.6  --  4.9*  --   --    GFR: Estimated Creatinine Clearance: 95.8 mL/min (by C-G formula based on SCr of 1.11 mg/dL (H)).  Liver Function Tests:  Recent Labs Lab 06/16/16 0500  AST 35  ALT 27  ALKPHOS 80  BILITOT 1.2  PROT 7.4  ALBUMIN 2.8*   HbA1C: Hgb A1c MFr Bld  Date/Time Value Ref Range Status  05/25/2016 09:48 PM 7.9 (H) 4.8 - 5.6 % Final    Comment:    (NOTE)         Pre-diabetes: 5.7 - 6.4         Diabetes: >6.4         Glycemic control for adults with diabetes: <7.0    CBG:  Recent Labs Lab 06/17/16 1558 06/17/16 2109 06/18/16 0441 06/18/16 0815 06/18/16 1205  GLUCAP 126* 120* 126* 139* 160*    Recent Results (from the past 240 hour(s))  Culture, blood (routine x 2)     Status: None   Collection Time: 06/09/16 10:30 AM  Result Value Ref Range Status   Specimen Description BLOOD LEFT HAND  Final   Special Requests BOTTLES DRAWN AEROBIC ONLY 10 CC  Final   Culture NO GROWTH 5 DAYS  Final   Report Status 06/14/2016 FINAL  Final  Culture, blood (routine x 2)     Status: None   Collection Time: 06/09/16 10:45 AM  Result Value Ref Range Status   Specimen Description BLOOD RIGHT HAND  Final   Special Requests BOTTLES DRAWN AEROBIC ONLY 10 CC  Final   Culture NO GROWTH 5 DAYS  Final   Report Status 06/14/2016 FINAL  Final  Culture, respiratory (NON-Expectorated)     Status: None   Collection Time: 06/10/16  9:32 AM  Result Value Ref Range Status   Specimen Description TRACHEAL ASPIRATE  Final   Special Requests NONE  Final   Gram Stain   Final    ABUNDANT WBC PRESENT, PREDOMINANTLY PMN RARE GRAM POSITIVE COCCI IN PAIRS RARE GRAM POSITIVE  RODS    Culture Consistent with normal respiratory flora.  Final   Report Status 06/12/2016 FINAL  Final     Scheduled Meds: . amLODipine  10 mg Per Tube Daily  . chlorhexidine gluconate (MEDLINE KIT)  15 mL Mouth Rinse BID  . enoxaparin (LOVENOX) injection  0.5 mg/kg Subcutaneous Q24H  . famotidine  20 mg   Per Tube BID  . feeding supplement (PRO-STAT SUGAR FREE 64)  30 mL Per Tube Daily  . free water  250 mL Per Tube Q4H  . hydrALAZINE  75 mg Oral Q8H  . isosorbide dinitrate  30 mg Per Tube BID  . labetalol  200 mg Per Tube BID  . mouth rinse  15 mL Mouth Rinse Q4H  . potassium chloride  40 mEq Per Tube BID   Continuous Infusions: . feeding supplement (GLUCERNA 1.2 CAL) 1,000 mL (06/18/16 0600)     LOS: 24 days   Cherene Altes, MD [facility] Office  [phone] Pager - Text Page per Shea Evans as per below:  On-Call/Text Page:      [email or web address]      password [email or web address]  If 7PM-7AM, please contact night-coverage [email or web address] Password [email or web address] 06/18/2016, 3:52 PM

## 2016-06-18 NOTE — Progress Notes (Deleted)
   06/18/16 1300  PT Visit Information  Last PT Received On 06/18/16  Assistance Needed +2  History of Present Illness 34 y/o F with PMH of morbid obesity (469 lbs), DM, HTN and tachycardia who presented to [facility] on 10/3 with reports of abdominal bloating and heavy abdominal pressure.  Intubated 05/29/16 to present, trached 06/10/16.  Subjective Data  Subjective Pt with PMV in place but voice still a whisper.  Precautions  Precautions Fall  Precaution Comments trach collar  Restrictions  Weight Bearing Restrictions No  Pain Assessment  Pain Assessment No/denies pain  Cognition  Arousal/Alertness Awake/alert  Behavior During Therapy WFL for tasks assessed/performed  Overall Cognitive Status Within Functional Limits for tasks assessed  Bed Mobility  Overal bed mobility Needs Assistance  Bed Mobility Rolling  Rolling Mod assist  General bed mobility comments Pt can now reach up and move herself up in bed, also pushes up on foot board. ST came in and needed pt to be in sitting position for FEES therefore assisted pt in 50 degree sitting position.  General Comments  General comments (skin integrity, edema, etc.) Pt had to be rolled to clean pt as her flexi seal continues to leak.   PT - End of Session  Equipment Utilized During Treatment Oxygen (trach collar 40%)  Activity Tolerance Patient limited by fatigue  Patient left with call bell/phone within reach;in bed (in chair position at 50 degrees)  Nurse Communication Mobility status;Need for lift equipment  PT - Assessment/Plan  PT Plan Discharge plan needs to be updated  PT Frequency (ACUTE ONLY) Min 3X/week  Follow Up Recommendations SNF;Supervision/Assistance - 24 hour  PT equipment Other (comment) (TBA)  PT Goal Progression  Progress towards PT goals Progressing toward goals  PT Time Calculation  PT Start Time (ACUTE ONLY) 0908  PT Stop Time (ACUTE ONLY) 0943  PT Time Calculation (min) (ACUTE ONLY) 35 min  PT General Charges   $$ ACUTE PT VISIT 1 Procedure  PT Treatments  $Therapeutic Activity 23-37 mins  Rebeccah Ivins,PT Acute Rehabilitation [phone] (pager)  

## 2016-06-18 NOTE — Progress Notes (Addendum)
   06/18/16 1300  PT Visit Information  Last PT Received On 06/18/16  Assistance Needed +2  History of Present Illness [name] with PMH of morbid obesity (469 lbs), DM, HTN and tachycardia who presented to [facility] on 10/3 with reports of abdominal bloating and heavy abdominal pressure.  Intubated 05/29/16 to present, trached 06/10/16.  Subjective Data  Subjective Pt with PMV in place but voice still a whisper.  Precautions  Precautions Fall  Precaution Comments trach collar  Restrictions  Weight Bearing Restrictions No  Pain Assessment  Pain Assessment No/denies pain  Cognition  Arousal/Alertness Awake/alert  Behavior During Therapy WFL for tasks assessed/performed  Overall Cognitive Status Within Functional Limits for tasks assessed  Bed Mobility  Overal bed mobility Needs Assistance  Bed Mobility Rolling  Rolling Mod assist  General bed mobility comments Pt can now reach up and move herself up in bed, also pushes up on foot board.  Transfers  Overall transfer level Needs assistance  Equipment used Rolling walker (2 wheeled)  Transfers Sit to/from Stand  Sit to Stand +2 physical assistance;From elevated surface;Max assist;Total assist  General transfer comment Pt was able to stand with Vital GO lift bed in full standing position for 10 minutes with pt performing 20 minisqquats and weight shifting bil directions. Semi reclined bed and had pt perform mini squats again at about 60 degrees of tilt in bed.    Balance  Standing balance support Bilateral upper extremity supported;During functional activity  Standing balance-Leahy Scale Zero  Standing balance comment Vital Go lift bed at full standing position for 10 minutes.Pt with straps loosened some so she could perform squats.  General Comments  General comments (skin integrity, edema, etc.) Pt had to be rolled before and after to clean pt as her flexi seal continues to leak.   General Exercises - Lower Extremity  Ankle Circles/Pumps  AROM;Both;5 reps;Supine  Quad Sets AROM;Both;10 reps;Supine  Gluteal Sets AROM;Both;Supine;20 reps  Mini-Sqauts AROM;Both;Standing;20 reps  PT - End of Session  Equipment Utilized During Treatment Oxygen (trach collar 40%)  Activity Tolerance Patient limited by fatigue  Patient left with call bell/phone within reach;in bed (in chair position at 50 degrees)  Nurse Communication Mobility status;Need for lift equipment  PT - Assessment/Plan  PT Plan Discharge plan needs to be updated  PT Frequency (ACUTE ONLY) Min 3X/week  Follow Up Recommendations SNF;Supervision/Assistance - 24 hour  PT equipment Other (comment) (TBA)  PT Goal Progression  Progress towards PT goals Progressing toward goals  PT Time Calculation  PT Start Time (ACUTE ONLY) 1031  PT Stop Time (ACUTE ONLY) 1118  PT Time Calculation (min) (ACUTE ONLY) 47 min  PT General Charges  $$ ACUTE PT VISIT 1 Procedure  PT Treatments  $Therapeutic Exercise 8-22 mins  $Therapeutic Activity 23-37 mins   Clinton Wahlberg,PT Acute Rehabilitation [phone] (pager)

## 2016-06-19 LAB — GLUCOSE, CAPILLARY
Glucose-Capillary: 132 mg/dL — ABNORMAL HIGH (ref 65–99)
Glucose-Capillary: 139 mg/dL — ABNORMAL HIGH (ref 65–99)
Glucose-Capillary: 136 mg/dL — ABNORMAL HIGH (ref 65–99)
Glucose-Capillary: 113 mg/dL — ABNORMAL HIGH (ref 65–99)
Glucose-Capillary: 108 mg/dL — ABNORMAL HIGH (ref 65–99)

## 2016-06-19 LAB — BASIC METABOLIC PANEL
Anion gap: 8 (ref 5–15)
BUN: 39 mg/dL — ABNORMAL HIGH (ref 6–20)
CO2: 32 mmol/L (ref 22–32)
Calcium: 9.8 mg/dL (ref 8.9–10.3)
Chloride: 106 mmol/L (ref 101–111)
Creatinine, Ser: 1.05 mg/dL — ABNORMAL HIGH (ref 0.44–1.00)
GFR calc Af Amer: >60 mL/min (ref >60)
GFR calc non Af Amer: >60 mL/min (ref >60)
Glucose, Bld: 119 mg/dL — ABNORMAL HIGH (ref 65–99)
Potassium: 4.4 mmol/L (ref 3.5–5.1)
Sodium: 146 mmol/L — ABNORMAL HIGH (ref 135–145)

## 2016-06-19 MED ORDER — ENOXAPARIN SODIUM 60 MG/0.6ML ~~LOC~~ SOLN
0.5000 mg/kg | SUBCUTANEOUS | Status: DC
Start: 2016-06-19 — End: 2016-06-20
  Administered 2016-06-19: 60 mg via SUBCUTANEOUS
  Filled 2016-06-19: qty 0.6

## 2016-06-19 MED ORDER — FUROSEMIDE 10 MG/ML PO SOLN
20.0000 mg | Freq: Two times a day (BID) | ORAL | Status: DC
  Administered 2016-06-19 – 2016-06-20 (×2): 20 mg
  Filled 2016-06-19 (×3): qty 2

## 2016-06-19 MED ORDER — FREE WATER
200.0000 mL | Freq: Three times a day (TID) | Status: DC
  Administered 2016-06-19 – 2016-06-20 (×3): 200 mL

## 2016-06-19 NOTE — Progress Notes (Signed)
[place] TEAM 1 - Stepdown/ICU TEAM  [name] [name]  QHU:[id number] DOB: 1980-11-27 DOA: 05/25/2016 PCP: No PCP Per Patient    Brief Narrative:  [name] with Hx of morbid obesity (469 lbs), DM, HTN and tachycardia who presented to [facility] on 10/3 with acute on chronic respiratory failure with hypercarbia and hypoxemia due to diastolic CHF exacerbation and obesity hypoventilation syndrome.  Ultimately required tracheostomy due to prolonged respiratory failure. Diuresed over 100 lbs.   Significant Events: 10/03  Admit 10/05  Transferred to ICU due to AMS, hypercarbic respiratory failure 10/06  Bipap, ntg gtt & BP remains elevated in 465'K systolic.  Net neg 5.5 L in last 24 hours with improvement in sr cr 10/07  intubated overnight .  10/07 -10/14 diuresed aggressively 10/14  Attempting wean 10/15  Weaning 10/18  febrile 10/21  Awake, following commands, PSV   Subjective: Resting comfortably in her bed.   Assessment & Plan:  Acute on Chronic Hypoxemic and Hypercarbic Respiratory Failure  S/p tracheostomy - ongoing vent care per PCCM - currently on TC during day and vent each night    Acute diastolic heart failure I/O balance still swinging back toward the positive side / now only ~8L net negative - w/ this Na has improved further as has creatinine - resume diuretic today and follow   Filed Weights   06/16/16 0500 06/17/16 0406 06/19/16 0323  Weight: (!) 162.8 kg (359 lb) (!) 137.9 kg (304 lb) 118.8 kg (262 lb)    Secondary pulmonary hypertension As per CHF tx plan   HTN BP reasonably controlled today - follow w/o change   Severe OSA / OHS with acute decompensation  To continue QHS vent support - PCCM directing care   AKI / Azotemia 2/2 diuresis Creatinine improved w/ holding of diuretic - follow w/ resumption of same today   Recent Labs Lab 06/14/16 0300 06/15/16 0520 06/16/16 0500 06/18/16 0500 06/19/16 0400  CREATININE 0.88 1.14* 1.02* 1.11* 1.05*     ?HCAP fevers, purulent secretions, sub-optimal CXR r/t body habitus - completed 6 days of empiric abx tx - no clinical evidence of ongoing infection   Demand ischemia Cards has completed an evaluation   Hypernatremia  Improved with holding of diuretic - cont to follow  Hypokalemia Corrected  Morbid obesity - Body mass index is 46.41 kg/m.  Altered Mental Status Appears resolved at this time w/ pt alert and cheerful - follow   Depression seen by Psych 05/28/16  DVT prophylaxis: lovenox  Code Status: FULL CODE Family Communication: no family present at time of exam  Disposition Plan: SDU  Consultants:  PCCM Cardiology   Antimicrobials:  Vanc 10/18 - 10/22 Zosyn 10/18 - 10/23  Objective: Blood pressure (!) 164/100, pulse 93, temperature 98 F (36.7 C), temperature source Oral, resp. rate 15, height 5' 3" (1.6 m), weight 118.8 kg (262 lb), SpO2 98 %, unknown if currently breastfeeding.  Intake/Output Summary (Last 24 hours) at 06/19/16 1506 Last data filed at 06/19/16 0600  Gross per 24 hour  Intake          1667.83 ml  Output             1425 ml  Net           242.83 ml   Filed Weights   06/16/16 0500 06/17/16 0406 06/19/16 0323  Weight: (!) 162.8 kg (359 lb) (!) 137.9 kg (304 lb) 118.8 kg (262 lb)    Examination: General: No acute respiratory distress  Lungs: no wheeze or appreciable crackles Cardiovascular: Regular rate and rhythm  Abdomen: morbidly obese, soft Extremities: 2+ edema bilateral LE w/ chronic venous stasis dermatitis   CBC:  Recent Labs Lab 06/14/16 0300 06/15/16 0520 06/16/16 0500  WBC 6.5 6.2 6.1  HGB 14.0 13.8 13.9  HCT 49.4* 48.3* 48.4*  MCV 84.9 84.9 83.7  PLT 180 163 885   Basic Metabolic Panel:  Recent Labs Lab 06/14/16 0300 06/15/16 0520 06/16/16 0500 06/18/16 0500 06/19/16 0400  NA 160* 154* 155* 149* 146*  K 3.8 3.9 4.2 4.3 4.4  CL 120* 112* 112* 107 106  CO2 31 31 33* 31 32  GLUCOSE 116* 127* 111* 110*  119*  BUN 48* 49* 42* 51* 39*  CREATININE 0.88 1.14* 1.02* 1.11* 1.05*  CALCIUM 9.9 9.7 9.9 10.0 9.8  MG  --  2.3 2.2  --   --   PHOS  --  4.9*  --   --   --    GFR: Estimated Creatinine Clearance: 92.4 mL/min (by C-G formula based on SCr of 1.05 mg/dL (H)).  Liver Function Tests:  Recent Labs Lab 06/16/16 0500  AST 35  ALT 27  ALKPHOS 80  BILITOT 1.2  PROT 7.4  ALBUMIN 2.8*   HbA1C: Hgb A1c MFr Bld  Date/Time Value Ref Range Status  05/25/2016 09:48 PM 7.9 (H) 4.8 - 5.6 % Final    Comment:    (NOTE)         Pre-diabetes: 5.7 - 6.4         Diabetes: >6.4         Glycemic control for adults with diabetes: <7.0    CBG:  Recent Labs Lab 06/18/16 1942 06/18/16 2330 06/19/16 0418 06/19/16 0833 06/19/16 1153  GLUCAP 113* 118* 132* 139* 136*    Recent Results (from the past 240 hour(s))  Culture, respiratory (NON-Expectorated)     Status: None   Collection Time: 06/10/16  9:32 AM  Result Value Ref Range Status   Specimen Description TRACHEAL ASPIRATE  Final   Special Requests NONE  Final   Gram Stain   Final    ABUNDANT WBC PRESENT, PREDOMINANTLY PMN RARE GRAM POSITIVE COCCI IN PAIRS RARE GRAM POSITIVE RODS    Culture Consistent with normal respiratory flora.  Final   Report Status 06/12/2016 FINAL  Final     Scheduled Meds: . amLODipine  10 mg Per Tube Daily  . chlorhexidine gluconate (MEDLINE KIT)  15 mL Mouth Rinse BID  . enoxaparin (LOVENOX) injection  0.5 mg/kg Subcutaneous Q24H  . famotidine  20 mg Per Tube BID  . feeding supplement (PRO-STAT SUGAR FREE 64)  30 mL Per Tube Daily  . free water  250 mL Per Tube Q4H  . hydrALAZINE  100 mg Oral Q8H  . isosorbide dinitrate  30 mg Per Tube BID  . labetalol  200 mg Per Tube BID  . mouth rinse  15 mL Mouth Rinse Q4H  . potassium chloride  20 mEq Per Tube BID   Continuous Infusions: . feeding supplement (GLUCERNA 1.2 CAL) 1,000 mL (06/18/16 0830)     LOS: 25 days   Cherene Altes, MD [facility] Office  [phone] Pager - Text Page per Shea Evans as per below:  On-Call/Text Page:      [email or web address]      password [email or web address]  If 7PM-7AM, please contact night-coverage [email or web address] Password [email or web address] 06/19/2016, 3:06 PM

## 2016-06-20 LAB — GLUCOSE, CAPILLARY
Glucose-Capillary: 104 mg/dL — ABNORMAL HIGH (ref 65–99)
Glucose-Capillary: 105 mg/dL — ABNORMAL HIGH (ref 65–99)
Glucose-Capillary: 116 mg/dL — ABNORMAL HIGH (ref 65–99)
Glucose-Capillary: 117 mg/dL — ABNORMAL HIGH (ref 65–99)
Glucose-Capillary: 118 mg/dL — ABNORMAL HIGH (ref 65–99)
Glucose-Capillary: 122 mg/dL — ABNORMAL HIGH (ref 65–99)

## 2016-06-20 LAB — BASIC METABOLIC PANEL
Anion gap: 7 (ref 5–15)
BUN: 31 mg/dL — ABNORMAL HIGH (ref 6–20)
CO2: 33 mmol/L — ABNORMAL HIGH (ref 22–32)
Calcium: 9.8 mg/dL (ref 8.9–10.3)
Chloride: 102 mmol/L (ref 101–111)
Creatinine, Ser: 0.92 mg/dL (ref 0.44–1.00)
GFR calc Af Amer: >60 mL/min (ref >60)
GFR calc non Af Amer: >60 mL/min (ref >60)
Glucose, Bld: 126 mg/dL — ABNORMAL HIGH (ref 65–99)
Potassium: 4.4 mmol/L (ref 3.5–5.1)
Sodium: 142 mmol/L (ref 135–145)

## 2016-06-20 MED ORDER — FUROSEMIDE 40 MG PO TABS
40.0000 mg | ORAL_TABLET | Freq: Two times a day (BID) | ORAL | Status: DC
  Administered 2016-06-20 – 2016-06-21 (×2): 40 mg
  Filled 2016-06-20 (×2): qty 1

## 2016-06-20 MED ORDER — FUROSEMIDE 40 MG PO TABS: 20.0000 mg | ORAL_TABLET | Freq: Two times a day (BID) | ORAL | Status: DC

## 2016-06-20 MED ORDER — ENOXAPARIN SODIUM 80 MG/0.8ML ~~LOC~~ SOLN
0.5000 mg/kg | SUBCUTANEOUS | Status: DC
  Administered 2016-06-20: 70 mg via SUBCUTANEOUS
  Filled 2016-06-20: qty 0.7

## 2016-06-20 NOTE — Plan of Care (Signed)
Problem: Education: Goal: Knowledge of [facility] Education information/materials will improve Outcome: Progressing Discussed with family and patient about blood pressures and medications with teach back displayed

## 2016-06-20 NOTE — Plan of Care (Signed)
Problem: Education: Goal: Knowledge of [place] [facility] Education information/materials will improve Outcome: Progressing Discussed with patient pain management and Ventilator on at night for her trach with some teach back displayed

## 2016-06-20 NOTE — Progress Notes (Signed)
[place] TEAM 1 - Stepdown/ICU TEAM  [name] [name]  JSE:[id number] DOB: 08-21-1981 DOA: 05/25/2016 PCP: No PCP Per Patient    Brief Narrative:  [name] with Hx of morbid obesity (469 lbs), DM, HTN and tachycardia who presented to [facility] on 10/3 with acute on chronic respiratory failure with hypercarbia and hypoxemia due to diastolic CHF exacerbation and obesity hypoventilation syndrome.  Ultimately required tracheostomy due to prolonged respiratory failure. Diuresed over 100 lbs.   Significant Events: 10/03  Admit 10/05  Transferred to ICU due to AMS, hypercarbic respiratory failure 10/06  Bipap, ntg gtt & BP remains elevated in 073'X systolic.  Net neg 5.5 L in last 24 hours with improvement in sr cr 10/07  intubated overnight .  10/07 -10/14 diuresed aggressively 10/14  Attempting wean 10/15  Weaning 10/18  febrile 10/21  Awake, following commands, PSV   Subjective: Alert and conversant.  Complains of some sore throat.  Denies chest pain shortness breath fevers chills nausea vomiting or abdominal pain.  He is anxious to be allowed to eat and drink.  Assessment & Plan:  Acute on Chronic Hypoxemic and Hypercarbic Respiratory Failure  S/p tracheostomy - ongoing vent care per PCCM - currently on TC during day and vent each night    Acute diastolic heart failure I/O balance still swinging back toward the positive side / now only ~7.5L net negative - w/ this Na has normalized as has creatinine - back on diuretic - follow    Filed Weights   06/17/16 0406 06/19/16 0323 06/20/16 0500  Weight: (!) 137.9 kg (304 lb) 118.8 kg (262 lb) (!) 136.8 kg (301 lb 8 oz)    Secondary pulmonary hypertension As per CHF tx plan   HTN BP reasonably controlled today - follow w/o change   Severe OSA / OHS with acute decompensation  To continue QHS vent support - PCCM directing care   AKI / Azotemia 2/2 diuresis Creatinine improved w/ holding of diuretic - follow now that diuretic  resumed  Recent Labs Lab 06/15/16 0520 06/16/16 0500 06/18/16 0500 06/19/16 0400 06/20/16 0610  CREATININE 1.14* 1.02* 1.11* 1.05* 0.92    ?HCAP fevers, purulent secretions, sub-optimal CXR r/t body habitus - completed 6 days of empiric abx tx - no clinical evidence of ongoing infection - stable off abx  Demand ischemia Cards has completed an evaluation   Hypernatremia  Resolved with holding of diuretic   Hypokalemia Corrected  Morbid obesity - Body mass index is 53.41 kg/m.  Altered Mental Status Appears resolved at this time   Depression seen by Psych 05/28/16  DVT prophylaxis: lovenox  Code Status: FULL CODE Family Communication: spoke w/ mother at bedside  Disposition Plan: SDU  Consultants:  PCCM Cardiology   Antimicrobials:  Vanc 10/18 - 10/22 Zosyn 10/18 - 10/23  Objective: Blood pressure 135/77, pulse 85, temperature 98.4 F (36.9 C), temperature source Oral, resp. rate 16, height _0  (1.6 m), weight (!) 136.8 kg (301 lb 8 oz), SpO2 98 %, unknown if currently breastfeeding.  Intake/Output Summary (Last 24 hours) at 06/20/16 1556 Last data filed at 06/20/16 0900  Gross per 24 hour  Intake             1430 ml  Output             2275 ml  Net             -845 ml   Filed Weights   06/17/16 0406 06/19/16 0323 06/20/16 0500  Weight: (!) 137.9 kg (304 lb) 118.8 kg (262 lb) (!) 136.8 kg (301 lb 8 oz)    Examination: General: No acute respiratory distress at rest - alert  Lungs: no wheeze or appreciable crackles B  Cardiovascular: Regular rate and rhythm  Abdomen: morbidly obese, soft, bs+ Extremities: 1+ edema bilateral LE w/ chronic venous stasis dermatitis   CBC:  Recent Labs Lab 06/14/16 0300 06/15/16 0520 06/16/16 0500  WBC 6.5 6.2 6.1  HGB 14.0 13.8 13.9  HCT 49.4* 48.3* 48.4*  MCV 84.9 84.9 83.7  PLT 180 163 283   Basic Metabolic Panel:  Recent Labs Lab 06/15/16 0520 06/16/16 0500 06/18/16 0500 06/19/16 0400  06/20/16 0610  NA 154* 155* 149* 146* 142  K 3.9 4.2 4.3 4.4 4.4  CL 112* 112* 107 106 102  CO2 31 33* 31 32 33*  GLUCOSE 127* 111* 110* 119* 126*  BUN 49* 42* 51* 39* 31*  CREATININE 1.14* 1.02* 1.11* 1.05* 0.92  CALCIUM 9.7 9.9 10.0 9.8 9.8  MG 2.3 2.2  --   --   --   PHOS 4.9*  --   --   --   --    GFR: Estimated Creatinine Clearance: 115 mL/min (by C-G formula based on SCr of 0.92 mg/dL).  Liver Function Tests:  Recent Labs Lab 06/16/16 0500  AST 35  ALT 27  ALKPHOS 80  BILITOT 1.2  PROT 7.4  ALBUMIN 2.8*   HbA1C: Hgb A1c MFr Bld  Date/Time Value Ref Range Status  05/25/2016 09:48 PM 7.9 (H) 4.8 - 5.6 % Final    Comment:    (NOTE)         Pre-diabetes: 5.7 - 6.4         Diabetes: >6.4         Glycemic control for adults with diabetes: <7.0    CBG:  Recent Labs Lab 06/19/16 2113 06/20/16 0006 06/20/16 0359 06/20/16 0904 06/20/16 1242  GLUCAP 108* 118* 117* 122* 116*    Scheduled Meds: . chlorhexidine gluconate (MEDLINE KIT)  15 mL Mouth Rinse BID  . enoxaparin (LOVENOX) injection  0.5 mg/kg Subcutaneous Q24H  . famotidine  20 mg Per Tube BID  . feeding supplement (PRO-STAT SUGAR FREE 64)  30 mL Per Tube Daily  . free water  200 mL Per Tube Q8H  . furosemide  20 mg Per Tube BID  . hydrALAZINE  100 mg Oral Q8H  . isosorbide dinitrate  30 mg Per Tube BID  . labetalol  200 mg Per Tube BID  . mouth rinse  15 mL Mouth Rinse Q4H  . potassium chloride  20 mEq Per Tube BID   Continuous Infusions: . feeding supplement (GLUCERNA 1.2 CAL) 1,000 mL (06/20/16 1059)     LOS: 26 days   Cherene Altes, MD [facility] Office  [phone] Pager - Text Page per Shea Evans as per below:  On-Call/Text Page:      [email or web address]      password [email or web address]  If 7PM-7AM, please contact night-coverage [email or web address] Password [email or web address] 06/20/2016, 3:56 PM

## 2016-06-21 LAB — BASIC METABOLIC PANEL
Anion gap: 10 (ref 5–15)
BUN: 28 mg/dL — ABNORMAL HIGH (ref 6–20)
CO2: 33 mmol/L — ABNORMAL HIGH (ref 22–32)
Calcium: 9.7 mg/dL (ref 8.9–10.3)
Chloride: 97 mmol/L — ABNORMAL LOW (ref 101–111)
Creatinine, Ser: 0.9 mg/dL (ref 0.44–1.00)
GFR calc Af Amer: >60 mL/min (ref >60)
GFR calc non Af Amer: >60 mL/min (ref >60)
Glucose, Bld: 124 mg/dL — ABNORMAL HIGH (ref 65–99)
Potassium: 4.4 mmol/L (ref 3.5–5.1)
Sodium: 140 mmol/L (ref 135–145)

## 2016-06-21 LAB — GLUCOSE, CAPILLARY
Glucose-Capillary: 125 mg/dL — ABNORMAL HIGH (ref 65–99)
Glucose-Capillary: 127 mg/dL — ABNORMAL HIGH (ref 65–99)
Glucose-Capillary: 132 mg/dL — ABNORMAL HIGH (ref 65–99)
Glucose-Capillary: 117 mg/dL — ABNORMAL HIGH (ref 65–99)
Glucose-Capillary: 116 mg/dL — ABNORMAL HIGH (ref 65–99)

## 2016-06-21 MED ORDER — PHENOL 1.4 % MT LIQD
1.0000 | OROMUCOSAL | Status: DC | PRN
  Filled 2016-06-21: qty 177

## 2016-06-21 MED ORDER — FUROSEMIDE 80 MG PO TABS
80.0000 mg | ORAL_TABLET | Freq: Two times a day (BID) | ORAL | Status: DC
  Administered 2016-06-21 – 2016-06-23 (×4): 80 mg
  Filled 2016-06-21 (×4): qty 1

## 2016-06-21 MED ORDER — ENOXAPARIN SODIUM 60 MG/0.6ML ~~LOC~~ SOLN
0.5000 mg/kg | SUBCUTANEOUS | Status: DC
  Administered 2016-06-21: 55 mg via SUBCUTANEOUS
  Filled 2016-06-21 (×2): qty 0.55

## 2016-06-21 NOTE — Plan of Care (Signed)
Problem: Education: Goal: Knowledge of [facility] Education information/materials will improve Outcome: Progressing Patient prefers getting chloraseptic spray for her dry mouth and sore throat.  Patient has refused protocol mouth care at times with teach back displayed

## 2016-06-21 NOTE — Plan of Care (Signed)
Problem: SLP Dysphagia Goals Goal: Patient will demonstrate readiness for PO's Patient will demonstrate readiness for PO's and/or instrumental swallow study as evidenced by: Ability to consume ice chips at bedside without overt s/s of aspiration with moderate assist for use of strategies.

## 2016-06-21 NOTE — Progress Notes (Signed)
Unable to remove her IJ triple lumen due to the patient unable to lay flat for the appropriate time frame.  Will have first shift call MD to see if it can be left in while patient needing daily lab draws and she is a hard stick.

## 2016-06-21 NOTE — Progress Notes (Signed)
PULMONARY / CRITICAL CARE MEDICINE   Name: [name] [name] MRN: [id number] DOB: Aug 15, 1981    ADMISSION DATE:  05/25/2016 CONSULTATION DATE:  05/27/16  REFERRING MD:  Dr. Ree Kida - [facility]  CHIEF COMPLAINT:  Altered Mental Status   BRIEF: [name] with PMH of morbid obesity (469 lbs), DM, HTN and tachycardia who presented to [facility] on 10/3 with acute on chronic respiratory failure with hypercarbia and hypoxemia due to diastolic CHF exacerbation and obesity hypoventilation syndrome.  Ultimately required tracheostomy due to prolonged respiratory failure. Diuresed over 100 lbs.   SUBJECTIVE:  No acute events overnight. Tolerating ATC throughout the day, PRVC at night. Denies any issues, feels comfortable.   VITAL SIGNS: BP 136/79   Pulse 92   Temp 98.3 F (36.8 C) (Oral)   Resp 14   Ht _0  (1.6 m)   Wt 249 lb (112.9 kg)   LMP  (LMP Unknown)   SpO2 96%   BMI 44.11 kg/m   HEMODYNAMICS:    VENTILATOR SETTINGS: Vent Mode: PCV FiO2 (%):  [30 %-40 %] 40 % Set Rate:  [16 bmp] 16 bmp Vt Set:  [420 mL] 420 mL PEEP:  [5 cmH20] 5 cmH20 Plateau Pressure:  [20 cmH20-22 cmH20] 20 cmH20  INTAKE / OUTPUT: I/O last 3 completed shifts: In: 14 [Other:60; NG/GT:3680] Out: 3185 [Urine:3085; Stool:100]  PHYSICAL EXAMINATION: General: Morbidly obese. No distress. Resting in bed. HENT: #6 proximal XLT trach c/d/i,  moist mucous membranes. No scleral icterus. PULM:   Course referred breath sounds. Normal work of breathing on tracheostomy collar. Symmetric chest wall expansion.  CV: Regular rate. No appreciable JVD given body habitus. GI:  Soft. Protuberant. Normal bowel sounds. Derm: Warm & dry. No rash on exposed skin. Neuro: Awake. Following commands. Grossly nonfocal & following commands.   LABS: PULMONARY No results for input(s): PHART, PCO2ART, PO2ART, HCO3, TCO2, O2SAT in the last 168 hours.  Invalid input(s): PCO2, PO2   CBC  Recent Labs Lab 06/15/16 0520 06/16/16 0500  HGB  13.8 13.9  HCT 48.3* 48.4*  WBC 6.2 6.1  PLT 163 161   COAGULATION No results for input(s): INR in the last 168 hours.   CARDIAC   No results for input(s): TROPONINI in the last 168 hours. No results for input(s): PROBNP in the last 168 hours.  CHEMISTRY  Recent Labs Lab 06/15/16 0520 06/16/16 0500 06/18/16 0500 06/19/16 0400 06/20/16 0610 06/21/16 0500  NA 154* 155* 149* 146* 142 140  K 3.9 4.2 4.3 4.4 4.4 4.4  CL 112* 112* 107 106 102 97*  CO2 31 33* 31 32 33* 33*  GLUCOSE 127* 111* 110* 119* 126* 124*  BUN 49* 42* 51* 39* 31* 28*  CREATININE 1.14* 1.02* 1.11* 1.05* 0.92 0.90  CALCIUM 9.7 9.9 10.0 9.8 9.8 9.7  MG 2.3 2.2  --   --   --   --   PHOS 4.9*  --   --   --   --   --    Estimated Creatinine Clearance: 104.5 mL/min (by C-G formula based on SCr of 0.9 mg/dL).  LIVER  Recent Labs Lab 06/16/16 0500  AST 35  ALT 27  ALKPHOS 80  BILITOT 1.2  PROT 7.4  ALBUMIN 2.8*    INFECTIOUS No results for input(s): LATICACIDVEN, PROCALCITON in the last 168 hours. ENDOCRINE CBG (last 3)   Recent Labs  06/20/16 2207 06/21/16 0645 06/21/16 0849  GLUCAP 104* 125* 127*    STUDIES:  TTE 10/14 >> EF  55%, PAP 42  MICROBIOLOGY: MRSA PCR 10/5 >>  Negative Tracheal Asp Ctx 10/17 >>  Oral Flora Blood Ctx x2 10/18 >>  Negative Tracheal Asp Ctx 10/19 >>  Oral Flora  ANTIBIOTICS: Vanc 10/18 - 10/22 Zosyn 10/18 - 10/24  SIGNIFICANT EVENTS: 10/03  Admit 10/05  Transferred to ICU due to AMS, hypercarbic respiratory failure 10/06  Bipap, ntg gtt & BP remains 022'V systolic.  Net neg 5.5 L in last 24 hours w improvement in sr cr 10/07  intubated overnight .  10/07 - 10/14 diuresed aggressively 10/14  Attempting wean 10/15  Weaning 10/18  febrile 10/21  Awake, following commands, PSV  10/25  Weaning on ATC 35%  LINES: ETT 10/7 - 10/21 Trach  #6 10/21 >> R IJ TLC 10/10 >>  NGT R 10/21 >> Foley 10/7 >> PIV x1  ASSESSMENT/PLAN:  35 y.o. with PMH of super  morbid obesity, HTN, pulmonary hypertension admitted with decompensated diastolic CHF.  Patient tolerating long periods of tracheostomy collar. Secretions are slowly improving. Discussed nocturnal ventilator use with respiratory therapy and nurse at bedside. Holding on [facility] valve/speech consult at this time given continued secretions.   Acute on Chronic Hypoxic & Hypercarbic Respiratory Failure - S/P tracheostomy.  Plan: Continue ATC throughout day and full vent support nocturnally.  Severe OSA/OHS. Plan: Recommend continued nocturnal ventilator. If she were to ever be decanulated, would need to ensure NIPPV compliance.  Pulmonary Hypertension -Multifactorial.  Plan: Continue gentle diuresis.  Dysphagia - in setting of vent/trach.   Plan: SLP evaluation for PMV / swallowing.  Remainder of care as per primary service.  PCCM will follow up 11/6.Marland Kitchen  Please call sooner if new needs arise.    Montey Hora, [place] [facility] Pager: [phone]  or [phone] 06/21/2016, 11:38 AM   STAFF NOTE: I, Merrie Roof, MD FACP have personally reviewed patient's available data, including medical history, events of note, physical examination and test results as part of my evaluation. I have discussed with resident/NP and other care providers such as pharmacist, RN and RRT. In addition, I personally evaluated patient and elicited key findings of:  Awake, no distress, obesity, distant BS, with her OHS she may require nocturnal ventilation  Until she is able to loose wt and improve upon her OHS, although her bicarb on chem is only 33 now was in high 40 and 50, therefore with this change will assess TC tonight and obtain abg at 5 am  Lavon Paganini. Titus Mould, MD, [place] Pgr: [place] 06/21/2016 12:12 PM

## 2016-06-21 NOTE — Progress Notes (Signed)
Speech Language Pathology Treatment: Dysphagia;Passy Muir Speaking valve  Patient Details Name: [name] [name] MRN: [id number] DOB: February 08, 1981 Today's Date: 06/21/2016 Time: 9150-4136 SLP Time Calculation (min) (ACUTE ONLY): 16 min  Assessment / Plan / Recommendation Clinical Impression  PMSV in place prior to arrival to room for treatment. Patient appearing comfortable and vital WFL, remaining WFL for remainder of treatment session. Patient continued to present with significant dysphonia, largely aphonic, however today with brief periods of increased intensity phonation with hoarse, breathy vocal quality and noted vocal cord adduction with cough suggestive of slow improvement in function with potential decrease in upper airway edema noted on FEES. No evidence of air trapping noted with valve removal. Replaced and patient instructed to utilize during all waking hours as tolerated with intermittent supervision.  Po trials provided. Patient able to self feed ice chips after completing oral care with s/s of aspiration characterized by throat clear/wet vocal quality post swallow in approximately 75% of trials.  Making slow progress with functional goals however not judged ready for repeat FEES. Question if large bore NG tube also contributing to dysfunction, particularly in light of already known upper airway edema. Will continue to f/u. May wish to consider removal of large bore NG tube prior to repeat FEES.    HPI HPI: [name] with PMH of morbid obesity (469 lbs), DM, HTN and tachycardia who presented to [facility] on 10/3 with acute on chronic respiratory failure with hypercarbia and hypoxemia due to diastolic CHF exacerbation and obesity hypoventilation syndrome.  Ultimately required tracheostomy due to prolonged respiratory failure. Diuresed over 100 lbs.  ETT 10/7 - 10/21; trach xl#6 10/21      SLP Plan  Continue with current plan of care     Recommendations  Diet recommendations: NPO Medication  Administration: Via alternative means      Patient may use Passy-Muir Speech Valve: During all waking hours (remove during sleep) PMSV Supervision: Intermittent         Oral Care Recommendations: Oral care QID Follow up Recommendations: LTACH Plan: Continue with current plan of care       [place], [place] ([place] 06/21/2016, 11:05 AM

## 2016-06-21 NOTE — Progress Notes (Signed)
  [facility] Coons [place], [place]- [phone]- no beds and no planned [place], [place], [place] [phone]- no female vent beds at this time  [facility] [place], [place], [place]- referral still under review  [facility], [place], [place]- [phone]- referral still under review  [facility] Nieves [place], [place]- [phone]- would need updates closer to pt DC but think they would be able to take her.  Barriers to DC: needs permanent feeding source either diet plan or PEG tube placement.  Would be helpful if pt could wean to trach collar at night and would allow for closer placement options for patient.  Jorge Ny, LCSW Clinical Social Worker [phone]

## 2016-06-21 NOTE — Progress Notes (Signed)
[place] TEAM 1 - Stepdown/ICU TEAM  [name] [name]  CLE:[id number] DOB: 05-02-1981 DOA: 05/25/2016 PCP: No PCP Per Patient    Brief Narrative:  [name] with Hx of morbid obesity (469 lbs), DM, HTN and tachycardia who presented to [facility] on 10/3 with acute on chronic respiratory failure with hypercarbia and hypoxemia due to diastolic CHF exacerbation and obesity hypoventilation syndrome.  Ultimately required tracheostomy due to prolonged respiratory failure. Diuresed over 100 lbs.   Significant Events: 10/03  Admit 10/05  Transferred to ICU due to AMS, hypercarbic respiratory failure 10/06  Bipap, ntg gtt & BP remains elevated in 944'H systolic.  Net neg 5.5 L in last 24 hours with improvement in sr cr 10/07  intubated overnight .  10/07 -10/14 diuresed aggressively 10/14  Attempting wean 10/15  Weaning 10/18  febrile 10/21  Awake, following commands, PSV   Subjective: Resting comfortably.  Unfortunately she was not able to be cleared for diet when evaluated by SLP today.  Pulmonary evaluated her today and will give her a trial of trach collar throughout the night tonight with a follow-up ABG in the morning.  Assessment & Plan:  Acute on Chronic Hypoxemic and Hypercarbic Respiratory Failure  S/p tracheostomy - ongoing vent care per PCCM - currently on TC during day and vent each night - to have trial of trach collar tonight   Acute diastolic heart failure I/O balance continues to [place] back toward the positive side / now only ~6.2L net negative - increase diuretic and follow sodium and creatinine   Filed Weights   06/20/16 0500 06/20/16 2144 06/21/16 6759  Weight: (!) 136.5 kg (301 lb) 113.2 kg (249 lb 8 oz) 112.9 kg (249 lb)    Secondary pulmonary hypertension As per CHF tx plan   HTN Not at goal - increase diuresis and follow  Severe OSA / OHS with acute decompensation  PCCM directing care   AKI / Azotemia 2/2 diuresis Creatinine improved w/ holding of diuretic - follow  now that diuretic being titrated upward  Recent Labs Lab 06/16/16 0500 06/18/16 0500 06/19/16 0400 06/20/16 0610 06/21/16 0500  CREATININE 1.02* 1.11* 1.05* 0.92 0.90    ?HCAP fevers, purulent secretions, sub-optimal CXR r/t body habitus - completed 6 days of empiric abx tx - no clinical evidence of ongoing infection - stable off abx  Demand ischemia Cards has completed an evaluation   Hypernatremia  Resolved   Hypokalemia Corrected  Morbid obesity - Body mass index is 44.11 kg/m.  Altered Mental Status Appears resolved at this time   Depression seen by Psych 05/28/16  DVT prophylaxis: lovenox  Code Status: FULL CODE Family Communication: No family present at time of exam today Disposition Plan: SDU  Consultants:  PCCM Cardiology   Antimicrobials:  Vanc 10/18 - 10/22 Zosyn 10/18 - 10/23  Objective: Blood pressure (!) 145/73, pulse 86, temperature 98.7 F (37.1 C), temperature source Oral, resp. rate (!) 23, height _0  (1.6 m), weight 112.9 kg (249 lb), SpO2 98 %, unknown if currently breastfeeding.  Intake/Output Summary (Last 24 hours) at 06/21/16 1519 Last data filed at 06/21/16 1400  Gross per 24 hour  Intake             1975 ml  Output             1810 ml  Net              165 ml   Filed Weights   06/20/16 0500 06/20/16 2144  06/21/16 0212  Weight: (!) 136.5 kg (301 lb) 113.2 kg (249 lb 8 oz) 112.9 kg (249 lb)    Examination: General: No acute respiratory distress at rest   Lungs: no wheeze or appreciable crackles B    CBC:  Recent Labs Lab 06/15/16 0520 06/16/16 0500  WBC 6.2 6.1  HGB 13.8 13.9  HCT 48.3* 48.4*  MCV 84.9 83.7  PLT 163 110   Basic Metabolic Panel:  Recent Labs Lab 06/15/16 0520 06/16/16 0500 06/18/16 0500 06/19/16 0400 06/20/16 0610 06/21/16 0500  NA 154* 155* 149* 146* 142 140  K 3.9 4.2 4.3 4.4 4.4 4.4  CL 112* 112* 107 106 102 97*  CO2 31 33* 31 32 33* 33*  GLUCOSE 127* 111* 110* 119* 126* 124*  BUN  49* 42* 51* 39* 31* 28*  CREATININE 1.14* 1.02* 1.11* 1.05* 0.92 0.90  CALCIUM 9.7 9.9 10.0 9.8 9.8 9.7  MG 2.3 2.2  --   --   --   --   PHOS 4.9*  --   --   --   --   --    GFR: Estimated Creatinine Clearance: 104.5 mL/min (by C-G formula based on SCr of 0.9 mg/dL).  Liver Function Tests:  Recent Labs Lab 06/16/16 0500  AST 35  ALT 27  ALKPHOS 80  BILITOT 1.2  PROT 7.4  ALBUMIN 2.8*   HbA1C: Hgb A1c MFr Bld  Date/Time Value Ref Range Status  05/25/2016 09:48 PM 7.9 (H) 4.8 - 5.6 % Final    Comment:    (NOTE)         Pre-diabetes: 5.7 - 6.4         Diabetes: >6.4         Glycemic control for adults with diabetes: <7.0    CBG:  Recent Labs Lab 06/20/16 1754 06/20/16 2207 06/21/16 0645 06/21/16 0849 06/21/16 1221  GLUCAP 105* 104* 125* 127* 132*    Scheduled Meds: . chlorhexidine gluconate (MEDLINE KIT)  15 mL Mouth Rinse BID  . enoxaparin (LOVENOX) injection  0.5 mg/kg (Order-Specific) Subcutaneous Q24H  . famotidine  20 mg Per Tube BID  . feeding supplement (PRO-STAT SUGAR FREE 64)  30 mL Per Tube Daily  . furosemide  40 mg Per Tube BID  . hydrALAZINE  100 mg Oral Q8H  . isosorbide dinitrate  30 mg Per Tube BID  . labetalol  200 mg Per Tube BID  . mouth rinse  15 mL Mouth Rinse Q4H  . potassium chloride  20 mEq Per Tube BID   Continuous Infusions: . feeding supplement (GLUCERNA 1.2 CAL) 1,000 mL (06/20/16 1800)     LOS: 27 days   Cherene Altes, MD [facility] Office  [phone] Pager - Text Page per Shea Evans as per below:  On-Call/Text Page:      [email or web address]      password [email or web address]  If 7PM-7AM, please contact night-coverage [email or web address] Password [email or web address] 06/21/2016, 3:19 PM

## 2016-06-22 ENCOUNTER — Inpatient Hospital Stay (HOSPITAL_COMMUNITY): Payer: PRIVATE HEALTH INSURANCE

## 2016-06-22 DIAGNOSIS — I10 Essential (primary) hypertension: Secondary | ICD-10-CM

## 2016-06-22 LAB — BASIC METABOLIC PANEL
Anion gap: 11 (ref 5–15)
BUN: 29 mg/dL — ABNORMAL HIGH (ref 6–20)
CO2: 33 mmol/L — ABNORMAL HIGH (ref 22–32)
Calcium: 9.7 mg/dL (ref 8.9–10.3)
Chloride: 95 mmol/L — ABNORMAL LOW (ref 101–111)
Creatinine, Ser: 0.86 mg/dL (ref 0.44–1.00)
GFR calc Af Amer: >60 mL/min (ref >60)
GFR calc non Af Amer: >60 mL/min (ref >60)
Glucose, Bld: 98 mg/dL (ref 65–99)
Potassium: 4.2 mmol/L (ref 3.5–5.1)
Sodium: 139 mmol/L (ref 135–145)

## 2016-06-22 LAB — GLUCOSE, CAPILLARY
Glucose-Capillary: 105 mg/dL — ABNORMAL HIGH (ref 65–99)
Glucose-Capillary: 143 mg/dL — ABNORMAL HIGH (ref 65–99)
Glucose-Capillary: 111 mg/dL — ABNORMAL HIGH (ref 65–99)
Glucose-Capillary: 123 mg/dL — ABNORMAL HIGH (ref 65–99)

## 2016-06-22 LAB — BLOOD GAS, ARTERIAL
Acid-Base Excess: 11.4 mmol/L — ABNORMAL HIGH (ref 0.0–2.0)
Bicarbonate: 36.3 mmol/L — ABNORMAL HIGH (ref 20.0–28.0)
Drawn by: 44956
FIO2: 0.4
O2 Saturation: 98.5 %
Patient temperature: 99
pCO2 arterial: 57.3 mmHg — ABNORMAL HIGH (ref 32.0–48.0)
pH, Arterial: 7.419 (ref 7.350–7.450)
pO2, Arterial: 135 mmHg — ABNORMAL HIGH (ref 83.0–108.0)

## 2016-06-22 MED ORDER — ENOXAPARIN SODIUM 60 MG/0.6ML ~~LOC~~ SOLN
55.0000 mg | SUBCUTANEOUS | Status: DC
Start: 2016-06-22 — End: 2016-06-23
  Administered 2016-06-22: 55 mg via SUBCUTANEOUS
  Filled 2016-06-22: qty 0.55

## 2016-06-22 NOTE — Progress Notes (Signed)
Patient's NG tube appears to have a puncture somewhere as it is clearly leaking.  Will replace per MD order

## 2016-06-22 NOTE — Progress Notes (Signed)
Occupational Therapy Treatment Patient Details Name: [name] [name] MRN: [id number] DOB: [birth year] Today's Date: 06/22/2016    History of present illness [name] with PMH of morbid obesity (469 lbs), DM, HTN and tachycardia who presented to [facility] on 10/3 with reports of abdominal bloating and heavy abdominal pressure.  Intubated 05/29/16 to present, trached 06/10/16.   OT comments  Pt continues to progress steadily. Now able to suction her mouth and use her cell phone independently. She stood with Vital Go bed, was placed in chair position and performed level 2 theraband exercises. Goals updated. Will continue to follow.  Follow Up Recommendations  SNF;Supervision/Assistance - 24 hour    Equipment Recommendations       Recommendations for Other Services      Precautions / Restrictions Precautions Precautions: Fall Precaution Comments: trach collar with PSMV Restrictions Weight Bearing Restrictions: No       Mobility Bed Mobility Overal bed mobility: Needs Assistance             General bed mobility comments: Pt assisting with pulling up in bed and repositioning trunk in supine using rails.  Transfers                      Balance       Sitting balance - Comments: postioned bed in sitting position       Standing balance comment: stood pt in Vital Go bed with straps loosened so pt could perform LB exercises                   ADL Overall ADL's : Needs assistance/impaired                                       General ADL Comments: Pt speaking with low volume with PSMV. Now able to demonstrate ability to use her cell phone to place calls and text.      Vision                     Perception     Praxis      Cognition   Behavior During Therapy: [facility] for tasks assessed/performed Overall Cognitive Status: Within Functional Limits for tasks assessed                       Extremity/Trunk Assessment                Exercises General Exercises - Upper Extremity Shoulder Flexion: Strengthening;10 reps;Seated;Theraband;Both Theraband Level (Shoulder Flexion): Level 2 (Red) Shoulder Horizontal ABduction: Strengthening;Both;10 reps;Theraband Theraband Level (Shoulder Horizontal Abduction): Level 2 (Red) Elbow Flexion: Strengthening;Both;10 reps;Theraband;Seated Theraband Level (Elbow Flexion): Level 2 (Red) Elbow Extension: Strengthening;Both;10 reps;Theraband;Seated Theraband Level (Elbow Extension): Level 2 (Red)   Shoulder Instructions       General Comments      Pertinent Vitals/ Pain       Pain Assessment: No/denies pain  Home Living                                          Prior Functioning/Environment              Frequency  Min 2X/week        Progress Toward Goals  OT Goals(current goals can now be  found in the care plan section)  Progress towards OT goals: Progressing toward goals  Acute Rehab OT Goals Patient Stated Goal: get NG tube out Time For Goal Achievement: 06/23/16 Potential to Achieve Goals: Good  Plan Discharge plan needs to be updated;Frequency remains appropriate    Co-evaluation    PT/OT/SLP Co-Evaluation/Treatment: Yes Reason for Co-Treatment: For patient/therapist safety;Complexity of the patient's impairments (multi-system involvement)   OT goals addressed during session: Strengthening/ROM      End of Session Equipment Utilized During Treatment: Oxygen   Activity Tolerance Patient tolerated treatment well   Patient Left with call bell/phone within reach;in bed;with family/visitor present (bed in chair positiong)   Nurse Communication          Time: 4492-0100 OT Time Calculation (min): 50 min  Charges: OT General Charges $OT Visit: 1 Procedure OT Treatments $Therapeutic Activity: 8-22 mins $Therapeutic Exercise: 8-22 mins  Malka So 06/22/2016, 4:15 PM

## 2016-06-22 NOTE — Care Management Note (Signed)
Case Management Note  Patient Details  Name: [name] [name] MRN: [id number] Date of Birth: 1980-09-09  Subjective/Objective:   Pt was kept on 40% TC overnight, plan is to keep her on 40% TC x 2 additional nights and then check ABGs.  CSW following for xfer to SNF for rehab.                          Expected Discharge Plan:  Skilled Nursing Facility  In-House Referral:  Clinical Social Work  Discharge planning Services  CM Consult  Status of Service:  In process, will continue to follow  Berton Mount, RN 06/22/2016, 11:46 AM

## 2016-06-22 NOTE — Progress Notes (Signed)
PROGRESS NOTE    [name] [name]  NIO:[id number] DOB: 01-02-1981 DOA: 05/25/2016 PCP: No PCP Per Patient   Brief Narrative:  35 y.o.  BF PMHx HTN, Sinus tachycardia, Gestational DM, Diabetes Type 2 Uncontrolled with complications, Super Morbid Obesity, OSA, Chronic Respiratory Failure, Medical Noncompliance   Who reports a 1-2 week history of progressive dyspnea on exertion and orthopnea with lower extremity and abdominal wall edema.  She does not know how much weight she has gained in the past 2-3 months, but she knows that her weight is up.  She has had noticeable increased abdominal girth.  She explicitly denies chest pain, pressure, or tightness.  No light-headedness or loss of consciousness.   She admits to noncompliance with recommended treatment for hypertension for YEARS.    Subjective: 10/31  A/O 4, able to speak with PMV in place. States was supposed to be on CPAP/BiPAP at home but has not been using. Tolerated trach collar with blow-by overnight.   Assessment & Plan:   Principal Problem:   Acute congestive heart failure (HCC) Active Problems:   Malignant hypertension   Morbid obesity (HCC)   OSA (obstructive sleep apnea)   Renal insufficiency   Acute pulmonary edema (HCC)   Abdominal pain   Hepatic congestion   Sinus tachycardia   Pericardial effusion   Pulmonary hypertension   Acute respiratory failure with hypoxemia (HCC)   Difficult intravenous access   Sinus pause   HCAP (healthcare-associated pneumonia)   Acute respiratory failure with hypoxia (HCC)   Acute on chronic respiratory failure with hypoxia and hypercapnia (HCC)   Tracheostomy status (HCC)   Acute diastolic CHF (congestive heart failure) (HCC)   Hypertensive urgency   Obesity hypoventilation syndrome ([place])   Acute kidney injury ([place])   Acute on Chronic Hypoxemic and Hypercarbic Respiratory Failure  -S/p tracheostomy - ongoing vent care per PCCM  -Obtain PCXR on 81/8 1  Acute Diastolic CHF    -Strict in and out since admission - 6.5 L (question accuracy) -Daily weight Filed Weights   06/20/16 0500 06/20/16 2144 06/21/16 0212  Weight: (!) 136.5 kg (301 lb) 113.2 kg (249 lb 8 oz) 112.9 kg (249 lb)  -Lasix 80 mg BID -Hydralazine 100 mg TID -Isosorbide dinitrate 30 mg BID -Labetalol 200 mg BID  Secondary pulmonary hypertension -As per CHF tx plan   Hypertensive Urgency -Resolved - BP currently well controlled - follow trend   Severe OSA / OHS with acute decompensation  -To continue QHS vent support  - PCCM directing care   AKI / Azotemia 2/2 diuresis -Diuretic per cardiology  ?HCAP - completed 6 days of empiric abx tx   -Suction tracheostomy q shift  Demand ischemia -Cards following   Hypernatremia  -Resolved continue to monitor closely  Hypokalemia -Corrected  Morbid obesity - Body mass index is 50.75 kg/m.  Altered Mental Status -Resolved   Depression -family reports pt is in "denial" - seen by Psych 05/28/16  Diabetes type 2 uncontrolled with complication -29/9 Hemoglobin A1c= 7.9  Dysphagia -Informed patient that if she fails swallow study again on Thursday will need to consider placing PEG tube     DVT prophylaxis: Lovenox Code Status: Full Family Communication:  Disposition Plan: Per cardiology   Consultants:  Dr.Wesam Kathryne Sharper PCCM [facility]     Procedures/Significant Events:  10/4 Echocardiogram: LVEF=:  50%to 55%.-- Left atrium:  mildly dilated.- Right atrium: mildly dilated. - Pulmonary arteries PA  peak pressure: 42 mm Hg (S). 10/05 Transferred  to ICU due to AMS, hypercarbic respiratory failure 10/06 Bipap, ntg gtt &BP remains elevated in 350'K systolic. Net neg 5.5 L in last 24 hours with improvement in sr cr 10/07 intubated overnight .  10/07 -10/14 diuresed aggressively 10/14 Attempting wean 10/15 Weaning 10/18 febrile 10/21 Awake, following commands, PSV   Cultures 10/5 MRSA  PCR negative 10/17 respiratory culture normal flora 10/18 blood left/right hand negative 10/19 tracheal aspirate normal flora   Antimicrobials: Vanc 10/18 -10/22 Zosyn 10/18 - 10/23   Devices    LINES / TUBES:  #6 cuffed extended trach>>    Continuous Infusions: . feeding supplement (GLUCERNA 1.2 CAL) 1,000 mL (06/22/16 0850)     Objective: Vitals:   06/22/16 0350 06/22/16 0400 06/22/16 0823 06/22/16 0959  BP:  131/71 130/84   Pulse:    97  Resp:    16  Temp: 99 F (37.2 C)  98.6 F (37 C)   TempSrc: Oral  Axillary   SpO2:  95%  93%  Weight:      Height:        Intake/Output Summary (Last 24 hours) at 06/22/16 1022 Last data filed at 06/22/16 0600  Gross per 24 hour  Intake              845 ml  Output             2100 ml  Net            -1255 ml   Filed Weights   06/20/16 0500 06/20/16 2144 06/21/16 9381  Weight: (!) 136.5 kg (301 lb) 113.2 kg (249 lb 8 oz) 112.9 kg (249 lb)    Examination:  General: A/O 4, NAD, Positive acute respiratory distress Eyes: negative scleral hemorrhage, negative anisocoria, negative icterus ENT: Negative Runny nose, negative gingival bleeding, Neck:  Negative scars, masses, torticollis, lymphadenopathy, JVD, tracheostomy and placed Lungs: clear to auscultation bilateral, negative wheezing/crackles  Cardiovascular: Regular rate and rhythm without murmur gallop or rub normal S1 and S2 Abdomen: MORBIDLY OBESE, negative abdominal pain, nondistended, positive soft, bowel sounds, no rebound, no ascites, no appreciable mass Extremities: No significant cyanosis, clubbing, bilateral lower extremity obesity with edema bilaterally  Skin: Negative rashes, lesions, ulcers Psychiatric:  Negative depression, negative anxiety, negative fatigue, negative mania  Central nervous system:  Cranial nerves II through XII intact, tongue/uvula midline, all extremities muscle strength 5/5, sensation intact throughout, negative receptive aphasia.  Follows all commands, cooperates with exam  .     Data Reviewed: Care during the described time interval was provided by me .  I have reviewed this patient's available data, including medical history, events of note, physical examination, and all test results as part of my evaluation. I have personally reviewed and interpreted all radiology studies.  CBC:  Recent Labs Lab 06/16/16 0500  WBC 6.1  HGB 13.9  HCT 48.4*  MCV 83.7  PLT 829   Basic Metabolic Panel:  Recent Labs Lab 06/16/16 0500 06/18/16 0500 06/19/16 0400 06/20/16 0610 06/21/16 0500 06/22/16 0637  NA 155* 149* 146* 142 140 139  K 4.2 4.3 4.4 4.4 4.4 4.2  CL 112* 107 106 102 97* 95*  CO2 33* 31 32 33* 33* 33*  GLUCOSE 111* 110* 119* 126* 124* 98  BUN 42* 51* 39* 31* 28* 29*  CREATININE 1.02* 1.11* 1.05* 0.92 0.90 0.86  CALCIUM 9.9 10.0 9.8 9.8 9.7 9.7  MG 2.2  --   --   --   --   --  GFR: Estimated Creatinine Clearance: 109.4 mL/min (by C-G formula based on SCr of 0.86 mg/dL). Liver Function Tests:  Recent Labs Lab 06/16/16 0500  AST 35  ALT 27  ALKPHOS 80  BILITOT 1.2  PROT 7.4  ALBUMIN 2.8*   No results for input(s): LIPASE, AMYLASE in the last 168 hours. No results for input(s): AMMONIA in the last 168 hours. Coagulation Profile: No results for input(s): INR, PROTIME in the last 168 hours. Cardiac Enzymes: No results for input(s): CKTOTAL, CKMB, CKMBINDEX, TROPONINI in the last 168 hours. BNP (last 3 results) No results for input(s): PROBNP in the last 8760 hours. HbA1C: No results for input(s): HGBA1C in the last 72 hours. CBG:  Recent Labs Lab 06/21/16 0849 06/21/16 1221 06/21/16 1730 06/21/16 2048 06/22/16 0826  GLUCAP 127* 132* 117* 116* 105*   Lipid Profile: No results for input(s): CHOL, HDL, LDLCALC, TRIG, CHOLHDL, LDLDIRECT in the last 72 hours. Thyroid Function Tests: No results for input(s): TSH, T4TOTAL, FREET4, T3FREE, THYROIDAB in the last 72 hours. Anemia  Panel: No results for input(s): VITAMINB12, FOLATE, FERRITIN, TIBC, IRON, RETICCTPCT in the last 72 hours. Urine analysis:    Component Value Date/Time   COLORURINE YELLOW 05/25/2016 1410   APPEARANCEUR CLEAR 05/25/2016 1410   LABSPEC 1.012 05/25/2016 1410   PHURINE 5.0 05/25/2016 1410   GLUCOSEU NEGATIVE 05/25/2016 1410   HGBUR NEGATIVE 05/25/2016 1410   BILIRUBINUR NEGATIVE 05/25/2016 1410   KETONESUR NEGATIVE 05/25/2016 1410   PROTEINUR NEGATIVE 05/25/2016 1410   UROBILINOGEN 1.0 12/12/2013 2146   NITRITE NEGATIVE 05/25/2016 1410   LEUKOCYTESUR NEGATIVE 05/25/2016 1410   Sepsis Labs: _0 (procalcitonin:4,lacticidven:4)  ) No results found for this or any previous visit (from the past 240 hour(s)).       Radiology Studies: Dg Abd Portable 1v  Result Date: 06/22/2016 CLINICAL DATA:  Advancement of NG tube. EXAM: PORTABLE ABDOMEN - 1 VIEW COMPARISON:  Earlier same day FINDINGS: Nasogastric tube is been advanced, passing along the greater curvature of the stomach with its tip in the midbody. IMPRESSION: Nasogastric tube well positioned in the stomach with its tip at the mid body. Electronically Signed   By: Nelson Chimes M.D.   On: 06/22/2016 07:53   Dg Abd Portable 1v  Result Date: 06/22/2016 CLINICAL DATA:  Nasogastric tube placement EXAM: PORTABLE ABDOMEN - 1 VIEW COMPARISON:  06/15/2016 FINDINGS: The nasogastric tube tip is in the low thoracic esophagus. It does not extend below the diaphragm. Recommend advancement at least 10 cm. IMPRESSION: Nasogastric tube does not reach the stomach. This needs to be advanced at least 10 cm. These results will be called to the ordering clinician or representative by the Radiologist Assistant, and communication documented in the PACS or zVision Dashboard. Electronically Signed   By: Andreas Newport M.D.   On: 06/22/2016 06:35        Scheduled Meds: . chlorhexidine gluconate (MEDLINE KIT)  15 mL Mouth Rinse BID  . enoxaparin  (LOVENOX) injection  0.5 mg/kg (Order-Specific) Subcutaneous Q24H  . famotidine  20 mg Per Tube BID  . feeding supplement (PRO-STAT SUGAR FREE 64)  30 mL Per Tube Daily  . furosemide  80 mg Per Tube BID  . hydrALAZINE  100 mg Oral Q8H  . isosorbide dinitrate  30 mg Per Tube BID  . labetalol  200 mg Per Tube BID  . mouth rinse  15 mL Mouth Rinse Q4H  . potassium chloride  20 mEq Per Tube BID   Continuous Infusions: . feeding supplement (  GLUCERNA 1.2 CAL) 1,000 mL (06/22/16 0850)     LOS: 28 days    Time spent: 40 minutes    Einar Nolasco, Geraldo Docker, MD [facility] Pager [phone]   If 7PM-7AM, please contact night-coverage [email or web address] Password [email or web address] 06/22/2016, 10:22 AM

## 2016-06-22 NOTE — Progress Notes (Signed)
PULMONARY / CRITICAL CARE MEDICINE   Name: [name] [name] MRN: [id number] DOB: 11-17-1980    ADMISSION DATE:  05/25/2016 CONSULTATION DATE:  05/27/16  REFERRING MD:  Dr. Ree Kida - [facility]  CHIEF COMPLAINT:  Altered Mental Status   BRIEF: [name] with PMH of morbid obesity (469 lbs), DM, HTN and tachycardia who presented to [facility] on 10/3 with acute on chronic respiratory failure with hypercarbia and hypoxemia due to diastolic CHF exacerbation and obesity hypoventilation syndrome.  Ultimately required tracheostomy due to prolonged respiratory failure. Diuresed over 100 lbs.   SUBJECTIVE:  No acute events overnight. Tolerating ATC throughout the day, Tolerated first night without nocturnal vent. Compensated blood gas. Denies any issues, feels comfortable, no issue with secretions.WearingPMV at present.   VITAL SIGNS: BP 130/84 (BP Location: Right Wrist)   Pulse 97   Temp 98.6 F (37 C) (Axillary)   Resp 16   Ht _0  (1.6 m)   Wt 249 lb (112.9 kg)   LMP  (LMP Unknown)   SpO2 93%   BMI 44.11 kg/m   HEMODYNAMICS:    VENTILATOR SETTINGS: FiO2 (%):  [40 %] 40 %  INTAKE / OUTPUT: I/O last 3 completed shifts: In: 2185 [P.O.:240; NG/GT:1945] Out: 6767 [Urine:3150; Stool:100]  PHYSICAL EXAMINATION: General: Morbidly obese. No distress. Resting in bed on TC 40% HENT: #6 proximal XLT trach c/d/i,  moist mucous membranes. No scleral icterus. PULM:   Course referred breath sounds. Normal work of breathing on tracheostomy collar. Symmetric chest wall expansion.  CV: Regular rate. No appreciable JVD given body habitus. GI:  Soft. Protuberant. Normal bowel sounds. Derm: Warm & dry. No rash on exposed skin. Neuro: Awake. Following commands. Grossly nonfocal & following commands.   LABS: PULMONARY  Recent Labs Lab 06/22/16 0432  PHART 7.419  PCO2ART 57.3*  PO2ART 135*  HCO3 36.3*  O2SAT 98.5     CBC  Recent Labs Lab 06/16/16 0500  HGB 13.9  HCT 48.4*  WBC 6.1  PLT 161    COAGULATION No results for input(s): INR in the last 168 hours.   CARDIAC   No results for input(s): TROPONINI in the last 168 hours. No results for input(s): PROBNP in the last 168 hours.  CHEMISTRY  Recent Labs Lab 06/16/16 0500 06/18/16 0500 06/19/16 0400 06/20/16 0610 06/21/16 0500 06/22/16 0637  NA 155* 149* 146* 142 140 139  K 4.2 4.3 4.4 4.4 4.4 4.2  CL 112* 107 106 102 97* 95*  CO2 33* 31 32 33* 33* 33*  GLUCOSE 111* 110* 119* 126* 124* 98  BUN 42* 51* 39* 31* 28* 29*  CREATININE 1.02* 1.11* 1.05* 0.92 0.90 0.86  CALCIUM 9.9 10.0 9.8 9.8 9.7 9.7  MG 2.2  --   --   --   --   --    Estimated Creatinine Clearance: 109.4 mL/min (by C-G formula based on SCr of 0.86 mg/dL).  LIVER  Recent Labs Lab 06/16/16 0500  AST 35  ALT 27  ALKPHOS 80  BILITOT 1.2  PROT 7.4  ALBUMIN 2.8*    INFECTIOUS No results for input(s): LATICACIDVEN, PROCALCITON in the last 168 hours. ENDOCRINE CBG (last 3)   Recent Labs  06/21/16 1730 06/21/16 2048 06/22/16 0826  GLUCAP 117* 116* 105*    STUDIES:  TTE 10/14 >> EF 55%, PAP 42  MICROBIOLOGY: MRSA PCR 10/5 >>  Negative Tracheal Asp Ctx 10/17 >>  Oral Flora Blood Ctx x2 10/18 >>  Negative Tracheal Asp Ctx 10/19 >>  Oral Flora  ANTIBIOTICS: Vanc 10/18 - 10/22 Zosyn 10/18 - 10/24  SIGNIFICANT EVENTS: 10/03  Admit 10/05  Transferred to ICU due to AMS, hypercarbic respiratory failure 10/06  Bipap, ntg gtt & BP remains 045'W systolic.  Net neg 5.5 L in last 24 hours w improvement in sr cr 10/07  intubated overnight .  10/07 - 10/14 diuresed aggressively 10/14  Attempting wean 10/15  Weaning 10/18  febrile 10/21  Awake, following commands, PSV  10/25  Weaning on ATC 35% 10/31  No nocturnal ventilation  LINES: ETT 10/7 - 10/21 Trach  #6 10/21 >> R IJ TLC 10/10 >>  NGT R 10/21 >> Foley 10/7 >> PIV x1  ASSESSMENT/PLAN:  35 y.o. with PMH of super morbid obesity, HTN, pulmonary hypertension admitted with  decompensated diastolic CHF.  Patient tolerating long periods of tracheostomy collar. Secretions are slowly improving. Tolerated first night off nocturnal vent, with compensated blood gas.   Acute on Chronic Hypoxic & Hypercarbic Respiratory Failure - S/P tracheostomy.  Plan: Continue ATC throughout day cautiously continue TC at night. ABG with compensated hypercarbia. Will continue 72 hours and check ABG Thursday am to evaluate if she will need nocturnal ventilation and placement. If she tolerates TC 24/7 x 72 hours goal would be capping trach and nocturnal CPAP.  Severe OSA/OHS. Plan: See above If she were to ever be decanulated, would need to ensure NIPPV compliance.  Pulmonary Hypertension -Multifactorial.  CXR indicated some HF this am Plan: Continue diuresis  Dysphagia - in setting of vent/trach.  Continued aspiration per swallow 10/30  Suspect large bore NG may be contributing to swallow dysfunction Plan: May need  Removal of NG and PEG if aspiration does not clear.  Remainder of care as per primary service.  PCCM will follow up 11/6.Marland Kitchen  Please call sooner if new needs arise.    Magdalen Spatz, NP [place] [facility] Pager:  [phone] 06/22/2016, 10:22 AM   STAFF NOTE: I, Merrie Roof, MD FACP have personally reviewed patient's available data, including medical history, events of note, physical examination and test results as part of my evaluation. I have discussed with resident/NP and other care providers such as pharmacist, RN and RRT. In addition, I personally evaluated patient and elicited key findings of: awake, not lethargic this am , remained off vent at night, ABG in am off vent on TC  - 7.41' 57, she has lowered her bicarb over last 1-2 weeks indicating less possible need nocturnal ventilation and degree of her OHS, agree with keeping off vent and re evaluating ABg thur am to ensure can not use vent at night, will need trial also of  NIMV at night with cap if we are successsfull off vent at night  Lavon Paganini. Titus Mould, MD, [place] Pgr: [place] [facility] 06/22/2016 12:37 PM

## 2016-06-22 NOTE — Progress Notes (Signed)
Physical Therapy Treatment Patient Details Name: [name] [name] MRN: [id number] DOB: [birth year] Today's Date: 06/22/2016    History of Present Illness [name] with PMH of morbid obesity (469 lbs), DM, HTN and tachycardia who presented to [facility] on 10/3 with reports of abdominal bloating and heavy abdominal pressure.  Intubated 05/29/16 to present, trached 06/10/16.    PT Comments    Patient progressing with standing tolerance, balance and performed weight shifting in standing upright with Vitalgo bed in upright position.  Spoke with RN about last session when pt up in recliner in room and per pt stayed up 8 hours then nursing had to scoot pt back in chair in order to lift to bed due to pt sliding out.  Feel though she is progressing, needs to start with smaller increments of time OOB and work her way up.  Today she decided on tx without use of the lift as she doesn't like it.  Feel next session would be appropriate to work on Schlater activities.  Continue to recommend SNF level rehab at d/c.  Follow Up Recommendations  SNF;Supervision/Assistance - 24 hour     Equipment Recommendations  Other (comment) (TBA)    Recommendations for Other Services       Precautions / Restrictions Precautions Precautions: Fall Precaution Comments: trach collar with PSMV Restrictions Weight Bearing Restrictions: No    Mobility  Bed Mobility Overal bed mobility: Needs Assistance             General bed mobility comments: Pt assisting with pulling up in bed and repositioning trunk in supine using rails.  +2 min A to scoot up and reposition as well  Transfers                    Ambulation/Gait                 Stairs            Wheelchair Mobility    Modified Rankin (Stroke Patients Only)       Balance Overall balance assessment: Needs assistance   Sitting balance-Leahy Scale: Poor Sitting balance - Comments: postioned bed in sitting position; leans to L positioned with  pillow under L elbow Postural control: Left lateral lean   Standing balance-Leahy Scale: Poor Standing balance comment: standing weight shifts with Vitalgo bed in upright position with RW and straps loosened, stood about 5 minutes performing weight shifts with UE support, reaching to touch targets with one then both UE's and som minisquats                    Cognition Arousal/Alertness: Awake/alert Behavior During Therapy: WFL for tasks assessed/performed Overall Cognitive Status: Within Functional Limits for tasks assessed                      Exercises General Exercises - Upper Extremity Shoulder Flexion: Strengthening;10 reps;Seated;Theraband;Both Theraband Level (Shoulder Flexion): Level 2 (Red) Shoulder Horizontal ABduction: Strengthening;Both;10 reps;Theraband Theraband Level (Shoulder Horizontal Abduction): Level 2 (Red) Elbow Flexion: Strengthening;Both;10 reps;Theraband;Seated Theraband Level (Elbow Flexion): Level 2 (Red) Elbow Extension: Strengthening;Both;10 reps;Theraband;Seated Theraband Level (Elbow Extension): Level 2 (Red)    General Comments        Pertinent Vitals/Pain Pain Assessment: No/denies pain Faces Pain Scale: Hurts a little bit Pain Location: rectum Pain Descriptors / Indicators: Grimacing Pain Intervention(s): Monitored during session    Home Living  Prior Function            PT Goals (current goals can now be found in the care plan section) Acute Rehab PT Goals Patient Stated Goal: get NG tube out PT Goal Formulation: With patient Time For Goal Achievement: 07/06/16 Potential to Achieve Goals: Good Progress towards PT goals: Progressing toward goals;Goals downgraded-see care plan    Frequency    Min 3X/week      PT Plan Current plan remains appropriate    Co-evaluation PT/OT/SLP Co-Evaluation/Treatment: Yes Reason for Co-Treatment: Complexity of the patient's impairments  (multi-system involvement);For patient/therapist safety PT goals addressed during session: Mobility/safety with mobility;Strengthening/ROM OT goals addressed during session: Strengthening/ROM     End of Session Equipment Utilized During Treatment: Oxygen (40% trach collar)   Patient left: in bed;with call bell/phone within reach;with family/visitor present (bed in chair position)     Time: 9741-6384 PT Time Calculation (min) (ACUTE ONLY): 41 min  Charges:  $Therapeutic Activity: 8-22 mins                    G Codes:      Reginia Naas 2016-07-10, 5:03 PM  Magda Kiel, [place] 07/10/16

## 2016-06-22 NOTE — Progress Notes (Signed)
Received a call from radiologist.  NG tube requires advancing.

## 2016-06-22 NOTE — Progress Notes (Signed)
Speech Language Pathology Treatment: Dysphagia;Passy Muir Speaking valve  Patient Details Name: [name] [name] MRN: [id number] DOB: June 19, 1981 Today's Date: 06/22/2016 Time: 5670-1410 SLP Time Calculation (min) (ACUTE ONLY): 22 min  Assessment / Plan / Recommendation Clinical Impression  Ongoing dysphagia/PMV treatment - pt's aunt present; PMV in place upon arrival and pt tolerating quite well.  VS stable.  Continues with dysphonia, weak cough.  PO trials provided - ice chips elicit wet phonation, weak throat clearing/cough.  Doubt significant improvement in swallow function given clinical presentation.  D/W Dr. Sherral Hammers - will plan for repeat FEES Thursday with removal of NG prior.  Discussed options for nutrition with [name] [name] - we discussed prognosis for recovery of swallow being positive, but timeline may be slower than she would hope.  Pt verbalizes understanding.  Will continue toward goals.    HPI HPI: [name] with PMH of morbid obesity (469 lbs), DM, HTN and tachycardia who presented to [facility] on 10/3 with acute on chronic respiratory failure with hypercarbia and hypoxemia due to diastolic CHF exacerbation and obesity hypoventilation syndrome.  Ultimately required tracheostomy due to prolonged respiratory failure. Diuresed over 100 lbs.  ETT 10/7 - 10/21; trach xl#6 10/21      SLP Plan  Continue with current plan of care     Recommendations  Diet recommendations: NPO Medication Administration: Via alternative means      Patient may use Passy-Muir Speech Valve: During all waking hours (remove during sleep) PMSV Supervision: Intermittent         Oral Care Recommendations: Oral care QID Follow up Recommendations: LTACH Plan: Continue with current plan of care       [place]. Tivis Ringer, [place] CCC/SLP Pager [phone]  [name] [name] 06/22/2016, 1:51 PM

## 2016-06-23 LAB — GLUCOSE, CAPILLARY
Glucose-Capillary: 103 mg/dL — ABNORMAL HIGH (ref 65–99)
Glucose-Capillary: 111 mg/dL — ABNORMAL HIGH (ref 65–99)
Glucose-Capillary: 112 mg/dL — ABNORMAL HIGH (ref 65–99)
Glucose-Capillary: 117 mg/dL — ABNORMAL HIGH (ref 65–99)
Glucose-Capillary: 127 mg/dL — ABNORMAL HIGH (ref 65–99)
Glucose-Capillary: 145 mg/dL — ABNORMAL HIGH (ref 65–99)

## 2016-06-23 MED ORDER — FUROSEMIDE 80 MG PO TABS
80.0000 mg | ORAL_TABLET | Freq: Three times a day (TID) | ORAL | Status: DC
  Administered 2016-06-23 – 2016-06-28 (×15): 80 mg
  Filled 2016-06-23 (×15): qty 1

## 2016-06-23 MED ORDER — ENOXAPARIN SODIUM 40 MG/0.4ML ~~LOC~~ SOLN
40.0000 mg | SUBCUTANEOUS | Status: DC
  Administered 2016-06-23 – 2016-06-28 (×6): 40 mg via SUBCUTANEOUS
  Filled 2016-06-23 (×6): qty 0.4

## 2016-06-23 NOTE — Progress Notes (Signed)
Rehab Admissions Coordinator Note:  Patient was screened by Retta Diones for appropriateness for an Inpatient Acute Rehab Consult.  At this time, we are recommending Inpatient Rehab consult.  Retta Diones 06/23/2016, 2:04 PM  I can be reached at [phone].

## 2016-06-23 NOTE — Progress Notes (Signed)
Occupational Therapy Treatment Patient Details Name: [name] [name] MRN: [id number] DOB: 1981-07-27 Today's Date: 06/23/2016    History of present illness [name] with PMH of morbid obesity (469 lbs), DM, HTN and tachycardia who presented to [facility] on 10/3 with reports of abdominal bloating and heavy abdominal pressure.  Intubated 05/29/16 to present, trached 06/10/16.   OT comments  Pt continues to be highly motivated and to make steady gains. Pt able to perform sit to stand from EOB x 2 today with 2 person assist. Not yet able to take steps. Pt's VS monitored throughout with 02 sats dropping to the low 80s requiring rest break and removal PSMV to return to low 90s. Pt not appearing to be in any distress and RN made aware. With pt's steady progress, family support and motivation, recommending inpatient rehab consult with potential for pt to return home after intensive rehab.  Follow Up Recommendations  CIR    Equipment Recommendations       Recommendations for Other Services Rehab consult    Precautions / Restrictions Precautions Precautions: Fall Precaution Comments: trach collar with PSMV Restrictions Weight Bearing Restrictions: No       Mobility Bed Mobility Overal bed mobility: Needs Assistance Bed Mobility: Supine to Sit;Sit to Supine     Supine to sit: Min assist Sit to supine: Mod assist;+2 for physical assistance   General bed mobility comments: cues for technique, guidance for LEs off EOB and min assist to raise trunk using rail, assist for LEs back into bed, +2 mod to min assist to reposition up in bed.  Transfers Overall transfer level: Needs assistance Equipment used: Rolling walker (2 wheeled) Transfers: Sit to/from Stand Sit to Stand: +2 physical assistance;Mod assist         General transfer comment: cues for hand placement and assist to rise, used momentum, perfomed x 2, second trial from elevated surface, pt unable to maintain balance with attempt to  side step    Balance Overall balance assessment: Needs assistance   Sitting balance-Leahy Scale: Fair Sitting balance - Comments: tends to prop on L UE, but able to sit without UB support when cued     Standing balance-Leahy Scale: Poor                     ADL                               Toileting- Clothing Manipulation and Hygiene: Total assistance;Sit to/from stand Toileting - Clothing Manipulation Details (indicate cue type and reason): performed pericare       General ADL Comments: Pt with louder speaking volume today. Continues to be readily willing to work with therapy.      Vision                     Perception     Praxis      Cognition   Behavior During Therapy: [facility] for tasks assessed/performed Overall Cognitive Status: Within Functional Limits for tasks assessed                       Extremity/Trunk Assessment               Exercises     Shoulder Instructions       General Comments      Pertinent Vitals/ Pain       Pain Assessment: No/denies  pain  Home Living                                          Prior Functioning/Environment              Frequency  Min 2X/week        Progress Toward Goals  OT Goals(current goals can now be found in the care plan section)  Progress towards OT goals: Progressing toward goals  Acute Rehab OT Goals Patient Stated Goal: get NG tube out Time For Goal Achievement: 07/07/16 Potential to Achieve Goals: Good  Plan Discharge plan needs to be updated    Co-evaluation    PT/OT/SLP Co-Evaluation/Treatment: Yes     OT goals addressed during session: Strengthening/ROM      End of Session Equipment Utilized During Treatment: Oxygen   Activity Tolerance Treatment limited secondary to medical complications (Comment) (02 sats monitored closely with intermittent rest breaks)   Patient Left in bed;with call bell/phone within reach (in chair  position)   Nurse Communication  (taped NGT to nose, aware of activity tolerance and desats)        Time: 7482-7078 OT Time Calculation (min): 55 min  Charges: OT General Charges $OT Visit: 1 Procedure OT Treatments $Therapeutic Activity: 23-37 mins  Malka So 06/23/2016, 1:32 PM  [phone]

## 2016-06-23 NOTE — Progress Notes (Signed)
PCCM Interval Progress Note  Per RN, pt tolerated ATC overnight again without need to be placed on vent support (48 hours now).  Will plan to repeat today / tonight with goal ATC again (72 hours total).  If so, will cap trach in AM 11/2 and attempt CPAP nocturnally.  Will see again in AM 11/2.  No visit / charge today.   Montey Hora, [place] [facility] Pager: [phone]  or [phone] 06/23/2016, 11:29 AM

## 2016-06-23 NOTE — Progress Notes (Addendum)
CSW spoke with patient's aunt, Pamala Hurry, [phone]. She was requesting documents for Medicaid. CSW directed her to appropriate department.   CSW left vm with [place] to check on vent snf referral and faxed updates.  Percell Locus Sherrel Shafer LCSWA [phone]

## 2016-06-23 NOTE — Progress Notes (Signed)
Physical Therapy Treatment Patient Details Name: [name] [name] MRN: [id number] DOB: 12/27/1980 Today's Date: 06/23/2016    History of Present Illness [name] with PMH of morbid obesity (469 lbs), DM, HTN and tachycardia who presented to [facility] on 10/3 with reports of abdominal bloating and heavy abdominal pressure.  Intubated 05/29/16 to present, trached 06/10/16.    PT Comments    Patient progressing to able to perform sit to stand from EOB with elevated height during second trial.  Patient able to take side steps up to [facility] with assist as well.  Continues to fatigue quickly, feel she may be appropriate for CIR level rehab upon d/c for more aggressive rehab and interdisciplinary care.   Follow Up Recommendations  CIR     Equipment Recommendations  Other (comment) (TBA)    Recommendations for Other Services       Precautions / Restrictions Precautions Precautions: Fall Precaution Comments: trach collar with PSMV Restrictions Weight Bearing Restrictions: No    Mobility  Bed Mobility Overal bed mobility: Needs Assistance Bed Mobility: Supine to Sit;Sit to Supine     Supine to sit: Min assist Sit to supine: Mod assist;+2 for physical assistance   General bed mobility comments: HOB elevated, pt used rail and scooted hips, min A for trunk; to supine, assist for bilateral LE's  Transfers Overall transfer level: Needs assistance Equipment used: Rolling walker (2 wheeled) Transfers: Sit to/from Stand Sit to Stand: +2 physical assistance;Mod assist         General transfer comment: cues for hand placement and assist to rise, used momentum, perfomed x 2, second trial from elevated surface, pt unable to maintain balance with attempt to side step  Ambulation/Gait Ambulation/Gait assistance: Mod assist;+2 safety/equipment Ambulation Distance (Feet): 1 Feet Assistive device: Rolling walker (2 wheeled)       General Gait Details: side steps up toward [facility]; attempted marching in  place and L knee buckled when lifting right and pt sat on EOB   Stairs            Wheelchair Mobility    Modified Rankin (Stroke Patients Only)       Balance Overall balance assessment: Needs assistance Sitting-balance support: Feet unsupported;Single extremity supported Sitting balance-Leahy Scale: Good Sitting balance - Comments: tends to prop on L UE, but able to sit without UB support when cued   Standing balance support: Bilateral upper extremity supported Standing balance-Leahy Scale: Poor Standing balance comment: needs assist and walker for balance                    Cognition Arousal/Alertness: Awake/alert Behavior During Therapy: WFL for tasks assessed/performed Overall Cognitive Status: Within Functional Limits for tasks assessed                      Exercises      General Comments General comments (skin integrity, edema, etc.): SpO2 down to 77% at one time seated EOB, pt without any signs of distress, PMSV removed and slowly sats rose to 90%; Again at end of session with bed in chair position and pt on TC @ 50% SpO2 dropped to 70's, then improved once valve removed.  RN aware.       Pertinent Vitals/Pain Pain Assessment: No/denies pain    Home Living                      Prior Function  PT Goals (current goals can now be found in the care plan section) Acute Rehab PT Goals Patient Stated Goal: get NG tube out Progress towards PT goals: Progressing toward goals    Frequency    Min 3X/week      PT Plan Discharge plan needs to be updated    Co-evaluation PT/OT/SLP Co-Evaluation/Treatment: Yes Reason for Co-Treatment: Complexity of the patient's impairments (multi-system involvement);For patient/therapist safety PT goals addressed during session: Mobility/safety with mobility;Strengthening/ROM OT goals addressed during session: Strengthening/ROM     End of Session Equipment Utilized During Treatment:  Gait belt;Oxygen Activity Tolerance: Patient tolerated treatment well Patient left: in bed;with call bell/phone within reach     Time: 6269-4854 PT Time Calculation (min) (ACUTE ONLY): 43 min  Charges:  $Therapeutic Activity: 23-37 mins                    G Codes:      Reginia Naas 06-25-2016, 1:48 PM  Magda Kiel, [place] 25-Jun-2016

## 2016-06-23 NOTE — Progress Notes (Signed)
[name] TEAM 1 - Stepdown/ICU TEAM  Magdalina Whitehead  LNL:[id number] DOB: April 11, 1981 DOA: 05/25/2016 PCP: No PCP Per Patient    Brief Narrative:  [name] with Hx of morbid obesity (469 lbs), DM, HTN and tachycardia who presented to [facility] on 10/3 with acute on chronic respiratory failure with hypercarbia and hypoxemia due to diastolic CHF exacerbation and obesity hypoventilation syndrome.  Ultimately required tracheostomy due to prolonged respiratory failure. Diuresed over 100 lbs.   Significant Events: 10/03  Admit 10/05  Transferred to ICU due to AMS, hypercarbic respiratory failure 10/06  Bipap, ntg gtt & BP remains elevated in 408'X systolic.  Net neg 5.5 L in last 24 hours with improvement in sr cr 10/07  intubated overnight .  10/07 -10/14 diuresed aggressively 10/14  Attempting wean 10/15  Weaning 10/18  febrile 10/21  Awake, following commands, PSV   Subjective: Patient has now tolerated 48 hours on trach collar only.  The goal is to reach 72 hours at which time her trach can be capped and a trial of nocturnal CPAP initiated.  She has not yet improved on her swallowing eval.  Assessment & Plan:  Acute on Chronic Hypoxemic and Hypercarbic Respiratory Failure  S/p tracheostomy - ongoing vent care per PCCM - currently on TC only and tolerating well - moving toward trial of capping 44/8  Acute diastolic heart failure I/O balance continues to swing back toward the positive side / now only ~4.8L net negative - do not believe weights are accurate - creatinine is stable - increase diuretic further and follow   Filed Weights   06/20/16 2144 06/21/16 0212 06/23/16 0700  Weight: 113.2 kg (249 lb 8 oz) 112.9 kg (249 lb) 79.4 kg (175 lb)    Secondary pulmonary hypertension As per CHF tx plan   HTN Still not at goal - increased diuresis again - follow  Severe OSA / OHS with acute decompensation  PCCM directing care   AKI / Azotemia 2/2 diuresis Resolved - follow w/ upward  titration in diuretic   Recent Labs Lab 06/18/16 0500 06/19/16 0400 06/20/16 0610 06/21/16 0500 06/22/16 0637  CREATININE 1.11* 1.05* 0.92 0.90 0.86    ?HCAP fevers, purulent secretions, sub-optimal CXR r/t body habitus - completed 6 days of empiric abx tx - no clinical evidence of ongoing infection - stable off abx  Demand ischemia Cards has completed an evaluation   Hypernatremia  Resolved   Hypokalemia Corrected  Morbid obesity - Body mass index is 31 kg/m.  Altered Mental Status Appears resolved at this time   Depression seen by Psych 05/28/16  DVT prophylaxis: lovenox  Code Status: FULL CODE Family Communication: No family present at time of exam today Disposition Plan: SDU - possible d/c to CIR - appears vent SNF no longer indicated   Consultants:  PCCM Cardiology   Antimicrobials:  Vanc 10/18 - 10/22 Zosyn 10/18 - 10/23  Objective: Blood pressure 130/82, pulse 83, temperature 98.2 F (36.8 C), temperature source Oral, resp. rate 20, height _0  (1.6 m), weight 79.4 kg (175 lb), SpO2 93 %, unknown if currently breastfeeding.  Intake/Output Summary (Last 24 hours) at 06/23/16 1512 Last data filed at 06/23/16 1200  Gross per 24 hour  Intake             1045 ml  Output             2350 ml  Net            -1305 ml  Filed Weights   06/20/16 2144 06/21/16 0212 06/23/16 0700  Weight: 113.2 kg (249 lb 8 oz) 112.9 kg (249 lb) 79.4 kg (175 lb)    Examination: General: No acute respiratory distress at rest   Lungs: Distant breath sounds in all fields with no focal crackles or wheezing Cardiovascular: Regular rate and rhythm with distant heart sounds Abdomen: Morbidly obese, soft, nondistended, bowel sounds positive Extremities: 1+ edema to knees bilateral lower extremities    CBC: No results for input(s): WBC, NEUTROABS, HGB, HCT, MCV, PLT in the last 168 hours.   Basic Metabolic Panel:  Recent Labs Lab 06/18/16 0500 06/19/16 0400  06/20/16 0610 06/21/16 0500 06/22/16 0637  NA 149* 146* 142 140 139  K 4.3 4.4 4.4 4.4 4.2  CL 107 106 102 97* 95*  CO2 31 32 33* 33* 33*  GLUCOSE 110* 119* 126* 124* 98  BUN 51* 39* 31* 28* 29*  CREATININE 1.11* 1.05* 0.92 0.90 0.86  CALCIUM 10.0 9.8 9.8 9.7 9.7   GFR: Estimated Creatinine Clearance: 90.2 mL/min (by C-G formula based on SCr of 0.86 mg/dL).  HbA1C: Hgb A1c MFr Bld  Date/Time Value Ref Range Status  05/25/2016 09:48 PM 7.9 (H) 4.8 - 5.6 % Final    Comment:    (NOTE)         Pre-diabetes: 5.7 - 6.4         Diabetes: >6.4         Glycemic control for adults with diabetes: <7.0    CBG:  Recent Labs Lab 06/22/16 2005 06/23/16 0034 06/23/16 0441 06/23/16 0821 06/23/16 1210  GLUCAP 123* 103* 127* 117* 145*    Scheduled Meds: . chlorhexidine gluconate (MEDLINE KIT)  15 mL Mouth Rinse BID  . enoxaparin (LOVENOX) injection  40 mg Subcutaneous Q24H  . famotidine  20 mg Per Tube BID  . feeding supplement (PRO-STAT SUGAR FREE 64)  30 mL Per Tube Daily  . furosemide  80 mg Per Tube BID  . hydrALAZINE  100 mg Oral Q8H  . isosorbide dinitrate  30 mg Per Tube BID  . labetalol  200 mg Per Tube BID  . mouth rinse  15 mL Mouth Rinse Q4H  . potassium chloride  20 mEq Per Tube BID     LOS: 29 days   Cherene Altes, MD [facility] Office  [phone] Pager - Text Page per Amion as per below:  On-Call/Text Page:      [email or web address]      password [email or web address]  If 7PM-7AM, please contact night-coverage [email or web address] Password [email or web address] 06/23/2016, 3:12 PM

## 2016-06-24 DIAGNOSIS — E1165 Type 2 diabetes mellitus with hyperglycemia: Secondary | ICD-10-CM

## 2016-06-24 DIAGNOSIS — F321 Major depressive disorder, single episode, moderate: Secondary | ICD-10-CM

## 2016-06-24 DIAGNOSIS — R5381 Other malaise: Secondary | ICD-10-CM

## 2016-06-24 DIAGNOSIS — E118 Type 2 diabetes mellitus with unspecified complications: Secondary | ICD-10-CM

## 2016-06-24 LAB — BASIC METABOLIC PANEL
Anion gap: 12 (ref 5–15)
BUN: 30 mg/dL — ABNORMAL HIGH (ref 6–20)
CO2: 35 mmol/L — ABNORMAL HIGH (ref 22–32)
Calcium: 9.7 mg/dL (ref 8.9–10.3)
Chloride: 89 mmol/L — ABNORMAL LOW (ref 101–111)
Creatinine, Ser: 0.88 mg/dL (ref 0.44–1.00)
GFR calc Af Amer: >60 mL/min (ref >60)
GFR calc non Af Amer: >60 mL/min (ref >60)
Glucose, Bld: 105 mg/dL — ABNORMAL HIGH (ref 65–99)
Potassium: 4.3 mmol/L (ref 3.5–5.1)
Sodium: 136 mmol/L (ref 135–145)

## 2016-06-24 LAB — GLUCOSE, CAPILLARY
Glucose-Capillary: 160 mg/dL — ABNORMAL HIGH (ref 65–99)
Glucose-Capillary: 120 mg/dL — ABNORMAL HIGH (ref 65–99)
Glucose-Capillary: 130 mg/dL — ABNORMAL HIGH (ref 65–99)

## 2016-06-24 NOTE — Progress Notes (Signed)
Nutrition Follow-up  DOCUMENTATION CODES:   Morbid obesity  INTERVENTION:   - Continue Glucerna 1.2 formula @ 55 mL/hr. - Continue Pro-stat liquid protein 30 mL daily via NGT. - Total TF regimen provides 1684 kcal (100% estimated energy needs), 101 grams protein (100% estimated protein needs), and 1063 mL of free water. - Continue free water flushes per MD. - Monitor for diet advancement and supplement as appropriate.  NUTRITION DIAGNOSIS:   Inadequate oral intake related to inability to eat as evidenced by NPO status.  Ongoing.  GOAL:   Patient will meet greater than or equal to 90% of their needs  Met with current TF regimen.  MONITOR:   Diet advancement, TF tolerance, Vent status, Labs, Weight trends, I & O's  ASSESSMENT:   [name] with PMH of morbid obesity (469 lbs), DM, HTN and tachycardia who presented to [facility] on 10/3 with reports of abdominal bloating and heavy abdominal pressure.    TF infusing @ goal of 55 mL/hr at time of visit. Free water flushes programmed into pump @ 30 mL every 6 hours.  Per SLP note, pt to undergo second FEES tomorrow AM without NGT in place. Per previous note, if pt is unable to advance to PO intake, pt may need PEG tube.  Weights during this admission have been variable.  Per imaging report, NG tube advanced, passing along the greater curvature of the stomach with tip in mid-body.  Medications reviewed and include 40 mg Lovenox daily, 20 mg Pepcid BID, 80 mg Lasix TID, 20 mEq potassium chloride BID, PRN Zofran  Labs reviewed and include elevated BUN (30 mg/dL) CBG's: 111-160 mg/dL  Diet Order:  Diet NPO time specified  Skin:  Wound (see comment) (wound on R/L tibial)  Last BM:  06/23/16  Height:   Ht Readings from Last 1 Encounters:  06/08/16 5' 3" (1.6 m)    Weight:   Wt Readings from Last 1 Encounters:  06/23/16 175 lb (79.4 kg)    Ideal Body Weight:  52.27 kg  BMI:  Body mass index is 31 kg/m.  Estimated  Nutritional Needs:   Kcal:  1600-1800  Protein:  95-105 gm  Fluid:  1.6-1.8 L  EDUCATION NEEDS:   No education needs identified at this time  Jeb Levering Dietetic Intern Pager Number: [phone]

## 2016-06-24 NOTE — Progress Notes (Signed)
ATC setup changed

## 2016-06-24 NOTE — Progress Notes (Signed)
Speech Language Pathology Treatment: Dysphagia  Patient Details Name: [name] [name] MRN: [id number] DOB: April 29, 1981 Today's Date: 06/24/2016 Time: 9155-0271 SLP Time Calculation (min) (ACUTE ONLY): 23 min  Assessment / Plan / Recommendation Clinical Impression  Planned for FEES this afternoon with NG tube out. Patient hesitant to remove NG tube due to discomfort of having to have tube replaced if unable to initiate a diet today. Current options include completion of FEES with large bore NG tube (not recommended as likely impacting function given previous MBS results), removing and completing FEES this pm with replacement of large bore NG tube this evening if needed (unable to replace with Cortrak after 1630 per RN), or completing FEES in am 11/3 with NG tube removed so that it can be replaced with Cortrak if needed (SLP recommendations). Discussed in detail with patient. Decision made to complete FEES first thing in am with NG tube removed, replacing with Cortrak if patient unable to initiate a diet.    HPI HPI: [name] with PMH of morbid obesity (469 lbs), DM, HTN and tachycardia who presented to [facility] on 10/3 with acute on chronic respiratory failure with hypercarbia and hypoxemia due to diastolic CHF exacerbation and obesity hypoventilation syndrome.  Ultimately required tracheostomy due to prolonged respiratory failure. Diuresed over 100 lbs.  ETT 10/7 - 10/21; trach xl#6 10/21      SLP Plan  Continue with current plan of care     Recommendations  Diet recommendations: NPO Medication Administration: Via alternative means                Plan: Continue with current plan of care       [place], [place] [phone]    [place] 06/24/2016, 3:11 PM

## 2016-06-24 NOTE — Progress Notes (Signed)
Speech Language Pathology Treatment: Dysphagia  Patient Details Name: [name] [name] MRN: [id number] DOB: [birth year] Today's Date: 06/24/2016 Time: 1751-0258 SLP Time Calculation (min) (ACUTE ONLY): 13 min  Assessment / Plan / Recommendation Clinical Impression  Treatment focused on readiness for repeat FEES. Patient alert and pleasant. PMSV in place. Vocal quality remains severely hoarse, breathy, but with improved intensity compared to previous visits, now with audible phonatory output in greater than 75% of attempts. Able to self feed clinician provided diagnostic po trials (ice chips) with decreased s/s of aspiration although intermittent throat clear remains. Patient eager to initiate a po diet. Will plan for FEES this pm. Recommend removal of large bore NG tube to maximize performance on exam, with the knowledge that if patient is unable to initiate pos, this will need to be replaced (hopefully with a smaller bore tube). Discussed with PA and patient who are both in agreement.    HPI HPI: [name] with PMH of morbid obesity (469 lbs), DM, HTN and tachycardia who presented to [facility] on 10/3 with acute on chronic respiratory failure with hypercarbia and hypoxemia due to diastolic CHF exacerbation and obesity hypoventilation syndrome.  Ultimately required tracheostomy due to prolonged respiratory failure. Diuresed over 100 lbs.  ETT 10/7 - 10/21; trach xl#6 10/21      SLP Plan   (FEES)     Recommendations  Diet recommendations: NPO Medication Administration: Via alternative means                Plan:  (FEES)       Elaine MA, CCC-SLP [phone]   Jesscia Imm Meryl 06/24/2016, 12:46 PM

## 2016-06-24 NOTE — Progress Notes (Signed)
Per MD no need for ABG, we are not goin to cap trach tomorrow. We are trying top wean fio2

## 2016-06-24 NOTE — Progress Notes (Signed)
Occupational Therapy Treatment Patient Details Name: [name] [name] MRN: [id number] DOB: 1980-09-09 Today's Date: 06/24/2016    History of present illness [name] with PMH of morbid obesity (469 lbs), DM, HTN and tachycardia who presented to [facility] on 10/3 with reports of abdominal bloating and heavy abdominal pressure.  Intubated 05/29/16 to present, trached 06/10/16.   OT comments  Pt performed level 2 theraband exercises and oral care with bed in chair position. Tolerated well. Pt to continue exercises between therapy sessions.  Follow Up Recommendations  CIR    Equipment Recommendations       Recommendations for Other Services      Precautions / Restrictions Precautions Precautions: Fall Precaution Comments: trach collar with PSMV       Mobility Bed Mobility                  Transfers                      Balance                                   ADL       Grooming: Oral care;Sitting;Minimal assistance                                        Vision                     Perception     Praxis      Cognition   Behavior During Therapy: WFL for tasks assessed/performed Overall Cognitive Status: Within Functional Limits for tasks assessed                       Extremity/Trunk Assessment               Exercises General Exercises - Upper Extremity Shoulder Flexion: Strengthening;10 reps;Seated;Theraband;Both Theraband Level (Shoulder Flexion): Level 2 (Red) Shoulder Extension: Strengthening;Both;15 reps;Seated;Theraband Theraband Level (Shoulder Extension): Level 2 (Red) Shoulder Horizontal ABduction: Strengthening;Both;Theraband;15 reps;Seated Theraband Level (Shoulder Horizontal Abduction): Level 2 (Red) Elbow Flexion: Strengthening;Both;Theraband;Seated;15 reps Theraband Level (Elbow Flexion): Level 2 (Red) (doubled) Elbow Extension: Strengthening;Both;Theraband;Seated;15 reps Theraband  Level (Elbow Extension): Level 2 (Red) (doubled)   Shoulder Instructions       General Comments      Pertinent Vitals/ Pain       Pain Assessment: No/denies pain  Home Living                                          Prior Functioning/Environment              Frequency  Min 2X/week        Progress Toward Goals  OT Goals(current goals can now be found in the care plan section)  Progress towards OT goals: Progressing toward goals  Acute Rehab OT Goals Patient Stated Goal: get NG tube out Time For Goal Achievement: 07/07/16 Potential to Achieve Goals: Good  Plan Discharge plan remains appropriate    Co-evaluation                 End of Session Equipment Utilized During Treatment: Oxygen   Activity Tolerance Patient tolerated treatment well  Patient Left in bed;with call bell/phone within reach;with family/visitor present   Nurse Communication          Time: 6916-7561 OT Time Calculation (min): 16 min  Charges: OT General Charges $OT Visit: 1 Procedure OT Treatments $Therapeutic Exercise: 8-22 mins  Malka So 06/24/2016, 12:17 PM  [phone]

## 2016-06-24 NOTE — Progress Notes (Signed)
I will follow up with pt and family to begin discussions of rehab venue options pending medical readiness and insurance approval. Pt with SLP at bedside and FEES. [phone]

## 2016-06-24 NOTE — Consult Note (Signed)
Physical Medicine and Rehabilitation Consult Reason for Consult: Debilitation related to acute on chronic hypoxemic and hypercarbic respiratory failure as well as diastolic congestive heart failure Referring Physician: [facility]   HPI: [name] [name] is a 35 y.o. right handed female with history of uncontrolled hypertension, morbid obesity and medical noncompliance. Per chart review independent prior to admission she has a 30-year-old daughter. Presented 05/25/2016 with progressive dyspnea on exertion and orthopnea with lower extremity abdominal wall edema. Oxygen saturations in the 70s. Blood pressure elevated  215/151. Chest x-ray in the ED consistent with CHF. EKG showed sinus tachycardia but no acute ST elevations. CTA of chest negative for pulmonary emboli. Mildly elevated troponin 0.04 felt to be related to heart strain CHF. BNP elevated at 600. Placed on intravenous Lasix to begin diuresis as per cardiology services. Wound care consulted for bilateral lower extremity edema and weeping of wounds. Echocardiogram with ejection fraction of 25% normal systolic function. Required intubation for respiratory failure 05/28/2016 per critical care medicine and later tracheostomy tube required completed 06/11/2016 per Dr. Redmond Baseman.. Patient now tolerating ATC throughout the day and currently with a #6 proximal XLT trach. Subcutaneous Lovenox for DVT prophylaxis. Nasogastric tube for nutritional support and currently remains NPO. Physical and occupational therapy evaluations completed and ongoing with slow progressive gains. M.D. has requested physical medicine rehabilitation consult.   Review of Systems  Unable to perform ROS: Acuity of condition   Past Medical History:  Diagnosis Date  . Diabetes mellitus without complication (HCC)    GDM  . Gestational diabetes mellitus in pregnancy 05/24/2013  . HTN in pregnancy, chronic 05/24/2013  . Hypertension   . Morbid obesity ([place])   . Sinus tachycardia     Past Surgical History:  Procedure Laterality Date  . TRACHEOSTOMY TUBE PLACEMENT N/A 06/11/2016   Procedure: TRACHEOSTOMY;  Surgeon: Melida Quitter, MD;  Location: [facility] OR;  Service: ENT;  Laterality: N/A;   Family History  Problem Relation Age of Onset  . Hypertension Mother   . Diabetes Other    Social History:  reports that she has never smoked. She has never used smokeless tobacco. She reports that she does not drink alcohol or use drugs. Allergies:  Allergies  Allergen Reactions  . No Known Allergies    Medications Prior to Admission  Medication Sig Dispense Refill  . ibuprofen (ADVIL,MOTRIN) 200 MG tablet Take 600 mg by mouth every 6 (six) hours as needed for mild pain.      Home: Home Living Family/patient expects to be discharged to:: Private residence Living Arrangements: Parent Available Help at Discharge: Family, Available PRN/intermittently Type of Home: House Home Access: Level entry [place]: One level Bathroom Shower/Tub: Multimedia programmer: [place]: None Additional Comments: Pt has a 74 year old daughter.  Pt was going to move into [place] soon per mom  Functional History: Prior Function Level of Independence: Independent Functional Status:  Mobility: Bed Mobility Overal bed mobility: Needs Assistance Bed Mobility: Supine to Sit, Sit to Supine Rolling: Mod assist Supine to sit: Min assist Sit to supine: Mod assist, +2 for physical assistance General bed mobility comments: HOB elevated, pt used rail and scooted hips, min A for trunk; to supine, assist for bilateral LE's Transfers Overall transfer level: Needs assistance Equipment used: Rolling walker (2 wheeled) Transfers: Sit to/from Stand Sit to Stand: +2 physical assistance, Mod assist  Lateral/Scoot Transfers: +2 physical assistance, Max assist General transfer comment: cues for hand placement and assist to rise,  used momentum, perfomed x 2, second trial from  elevated surface, pt unable to maintain balance with attempt to side step Ambulation/Gait Ambulation/Gait assistance: Mod assist, +2 safety/equipment Ambulation Distance (Feet): 1 Feet Assistive device: Rolling walker (2 wheeled) Gait Pattern/deviations: Step-to pattern, Decreased stride length, Decreased step length - right, Decreased step length - left, Drifts right/left, Trunk flexed, Wide base of support General Gait Details: side steps up toward [facility]; attempted marching in place and L knee buckled when lifting right and pt sat on EOB Gait velocity interpretation: Below normal speed for age/gender    ADL: ADL Overall ADL's : Needs assistance/impaired Grooming: Wash/dry hands, Sitting, Set up (applied lotion to hands) Grooming Details (indicate cue type and reason): wiped eyes with wet washcloth, swabbed mouth, NGT limits ability to wash full face Toileting- Clothing Manipulation and Hygiene: Total assistance, Sit to/from stand Toileting - Clothing Manipulation Details (indicate cue type and reason): performed pericare General ADL Comments: Pt with louder speaking volume today. Continues to be readily willing to work with therapy.  Cognition: Cognition Overall Cognitive Status: Within Functional Limits for tasks assessed Orientation Level: Oriented X4 Cognition Arousal/Alertness: Awake/alert Behavior During Therapy: WFL for tasks assessed/performed Overall Cognitive Status: Within Functional Limits for tasks assessed Area of Impairment: Attention Orientation Level: Disoriented to, Time, Situation Current Attention Level: Sustained Memory: Decreased recall of precautions Following Commands: Follows one step commands consistently Safety/Judgement: Decreased awareness of safety, Decreased awareness of deficits Problem Solving: Requires verbal cues, Requires tactile cues, Difficulty sequencing General Comments: Pt attending during UB exercises to keep count of repetitions. Worked on  placing call with house phone.Pt oriented to month and day.    Blood pressure (!) 150/90, pulse 88, temperature 98.3 F (36.8 C), temperature source Oral, resp. rate 10, height 5' 3" (1.6 m), weight 79.4 kg (175 lb), SpO2 99 %, unknown if currently breastfeeding. Physical Exam  Constitutional:  35 year old right-handed morbidly obese female  HENT:  Head: Normocephalic.  Nasogastric tube in place  Eyes: EOM are normal.  Neck:  #6 trach  Cardiovascular: Regular rhythm.   Tachycardic   Respiratory:  Decreased breath sounds at the bases but clear to auscultation  GI: Soft. Bowel sounds are normal. She exhibits no distension. There is no tenderness.  Musculoskeletal:  4/5 bilateral UE. LE: 3/5 HF, KE and 4/5 ADF/PF. Senses pain and LT in all 4's  Neurological: She is alert.  Makes eye contact with examiner. Follows basic commands. Yes and  no head nods to simple questions    Results for orders placed or performed during the hospital encounter of 05/25/16 (from the past 24 hour(s))  Glucose, capillary     Status: Abnormal   Collection Time: 06/23/16  8:21 AM  Result Value Ref Range   Glucose-Capillary 117 (H) 65 - 99 mg/dL  Glucose, capillary     Status: Abnormal   Collection Time: 06/23/16 12:10 PM  Result Value Ref Range   Glucose-Capillary 145 (H) 65 - 99 mg/dL  Glucose, capillary     Status: Abnormal   Collection Time: 06/23/16  4:21 PM  Result Value Ref Range   Glucose-Capillary 111 (H) 65 - 99 mg/dL  Glucose, capillary     Status: Abnormal   Collection Time: 06/23/16  8:27 PM  Result Value Ref Range   Glucose-Capillary 112 (H) 65 - 99 mg/dL   Comment 1 Notify RN    Comment 2 Document in Chart    Dg Abd Portable 1v  Result Date: 06/22/2016 CLINICAL DATA:  Advancement  of NG tube. EXAM: PORTABLE ABDOMEN - 1 VIEW COMPARISON:  Earlier same day FINDINGS: Nasogastric tube is been advanced, passing along the greater curvature of the stomach with its tip in the midbody.  IMPRESSION: Nasogastric tube well positioned in the stomach with its tip at the mid body. Electronically Signed   By: Nelson Chimes M.D.   On: 06/22/2016 07:53   Dg Abd Portable 1v  Result Date: 06/22/2016 CLINICAL DATA:  Nasogastric tube placement EXAM: PORTABLE ABDOMEN - 1 VIEW COMPARISON:  06/15/2016 FINDINGS: The nasogastric tube tip is in the low thoracic esophagus. It does not extend below the diaphragm. Recommend advancement at least 10 cm. IMPRESSION: Nasogastric tube does not reach the stomach. This needs to be advanced at least 10 cm. These results will be called to the ordering clinician or representative by the Radiologist Assistant, and communication documented in the PACS or zVision Dashboard. Electronically Signed   By: Andreas Newport M.D.   On: 06/22/2016 06:35    Assessment/Plan: Diagnosis: Debility due to CHF/respiratory failure in an obese 35 yo female 1. Does the need for close, 24 hr/day medical supervision in concert with the patient's rehab needs make it unreasonable for this patient to be served in a less intensive setting? Yes 2. Co-Morbidities requiring supervision/potential complications: AKI, pnemonia 3. Due to bladder management, bowel management, safety, skin/wound care, disease management, medication administration, pain management and patient education, does the patient require 24 hr/day rehab nursing? Yes 4. Does the patient require coordinated care of a physician, rehab nurse, PT (1-2 hrs/day, 5 days/week), OT (1-2 hrs/day, 5 days/week) and SLP (1-2 hrs/day, 5 days/week) to address physical and functional deficits in the context of the above medical diagnosis(es)? Yes Addressing deficits in the following areas: balance, endurance, locomotion, strength, transferring, bowel/bladder control, bathing, dressing, feeding, grooming, toileting, swallowing and psychosocial support 5. Can the patient actively participate in an intensive therapy program of at least 3 hrs of  therapy per day at least 5 days per week? Yes 6. The potential for patient to make measurable gains while on inpatient rehab is excellent 7. Anticipated functional outcomes upon discharge from inpatient rehab are modified independent  with PT, modified independent with OT, modified independent with SLP. 8. Estimated rehab length of stay to reach the above functional goals is: 16-22 days 9. Does the patient have adequate social supports and living environment to accommodate these discharge functional goals? Yes 10. Anticipated D/C setting: Home 11. Anticipated post D/C treatments: HH therapy and Outpatient therapy 12. Overall Rehab/Functional Prognosis: excellent  RECOMMENDATIONS: This patient's condition is appropriate for continued rehabilitative care in the following setting: CIR Patient has agreed to participate in recommended program. Yes Note that insurance prior authorization may be required for reimbursement for recommended care.  Comment: Rehab Admissions Coordinator to follow up.  Thanks,  Meredith Staggers, MD, Mellody Drown     06/24/2016

## 2016-06-24 NOTE — Progress Notes (Addendum)
PROGRESS NOTE    [name] [name]  EHM:[id number] DOB: Jun 14, 1981 DOA: 05/25/2016 PCP: No PCP Per Patient   Brief Narrative:  35 y.o.  BF PMHx HTN, Sinus tachycardia, Gestational DM, Diabetes Type 2 Uncontrolled with complications, Super Morbid Obesity, OSA, Chronic Respiratory Failure, Medical Noncompliance   Who reports a 1-2 week history of progressive dyspnea on exertion and orthopnea with lower extremity and abdominal wall edema.  She does not know how much weight she has gained in the past 2-3 months, but she knows that her weight is up.  She has had noticeable increased abdominal girth.  She explicitly denies chest pain, pressure, or tightness.  No light-headedness or loss of consciousness.   She admits to noncompliance with recommended treatment for hypertension for YEARS.    Subjective: 11/2  A/O 4, able to speak with PMV in place. Tolerated trach collar with blow-by overnight. States stood up yesterday and took a couple of steps.   Assessment & Plan:   Principal Problem:   Acute congestive heart failure (HCC) Active Problems:   Malignant hypertension   Morbid obesity (HCC)   OSA (obstructive sleep apnea)   Renal insufficiency   Acute pulmonary edema (HCC)   Abdominal pain   Hepatic congestion   Sinus tachycardia   Pericardial effusion   Pulmonary hypertension   Acute respiratory failure with hypoxemia (HCC)   Difficult intravenous access   Sinus pause   HCAP (healthcare-associated pneumonia)   Acute respiratory failure with hypoxia (HCC)   Acute on chronic respiratory failure with hypoxia and hypercapnia (HCC)   Tracheostomy status (HCC)   Acute diastolic CHF (congestive heart failure) (HCC)   Hypertensive urgency   Obesity hypoventilation syndrome ([place])   Acute kidney injury ([place])   Acute on Chronic Hypoxemic and Hypercarbic Respiratory Failure  -S/p tracheostomy - ongoing vent care per PCCM   Acute Diastolic CHF  -Strict in and out since admission - 7.2  L -Daily weight Filed Weights   06/20/16 2144 06/21/16 0212 06/23/16 0700  Weight: 113.2 kg (249 lb 8 oz) 112.9 kg (249 lb) 79.4 kg (175 lb)  -Lasix 80 mg TID -Hydralazine 100 mg TID -Isosorbide dinitrate 30 mg BID -Labetalol 200 mg BID  Secondary pulmonary hypertension -As per CHF tx plan   Hypertensive Urgency -Resolved - BP currently well controlled - follow trend   Severe OSA / OHS with acute decompensation  -To continue QHS vent support PRN (has not required) - PCCM directing care   AKI / Azotemia 2/2 diuresis -Diuretic per cardiology  ?HCAP - completed 6 days of empiric abx tx   -Suction tracheostomy q shift  Demand ischemia -Cards following   Hypernatremia  -Resolved continue to monitor closely  Hypokalemia -Corrected  Morbid obesity - Body mass index is 50.75 kg/m.  Altered Mental Status -Resolved   Depression -family reports pt is in "denial" - seen by Psych 05/28/16  Diabetes type 2 uncontrolled with complication -36/6 Hemoglobin A1c= 7.9  Dysphagia -Informed patient that if she fails swallow study today will need to consider placing PEG tube     DVT prophylaxis: Lovenox Code Status: Full Family Communication:  Disposition Plan: Per cardiology   Consultants:  Dr.Wesam G [facility]     Procedures/Significant Events:  10/4 Echocardiogram: LVEF=:  50%to 55%.-- Left atrium:  mildly dilated.- Right atrium: mildly dilated. - Pulmonary arteries PA  peak pressure: 42 mm Hg (S). 10/05 Transferred to ICU due to AMS, hypercarbic respiratory failure 10/06 Bipap, ntg gtt &  BP remains elevated in 481'E systolic. Net neg 5.5 L in last 24 hours with improvement in sr cr 10/07 intubated overnight .  10/07 -10/14 diuresed aggressively 10/14 Attempting wean 10/15 Weaning 10/18 febrile 10/21 Awake, following commands, PSV   Cultures 10/5 MRSA PCR negative 10/17 respiratory culture normal flora 10/18  blood left/right hand negative 10/19 tracheal aspirate normal flora   Antimicrobials: Vanc 10/18 -10/22 Zosyn 10/18 - 10/23   Devices    LINES / TUBES:  #6 cuffed extended trach>>    Continuous Infusions: . feeding supplement (GLUCERNA 1.2 CAL) 1,000 mL (06/24/16 0217)     Objective: Vitals:   06/24/16 0349 06/24/16 0400 06/24/16 0450 06/24/16 0908  BP:   (!) 150/90 131/79  Pulse:      Resp:   10 16  Temp:  98.3 F (36.8 C)    TempSrc:  Oral    SpO2: 97%  99%   Weight:      Height:        Intake/Output Summary (Last 24 hours) at 06/24/16 1009 Last data filed at 06/24/16 0700  Gross per 24 hour  Intake              924 ml  Output             1725 ml  Net             -801 ml   Filed Weights   06/20/16 2144 06/21/16 0212 06/23/16 0700  Weight: 113.2 kg (249 lb 8 oz) 112.9 kg (249 lb) 79.4 kg (175 lb)    Examination:  General: A/O 4, NAD, Positive acute respiratory distress Eyes: negative scleral hemorrhage, negative anisocoria, negative icterus ENT: Negative Runny nose, negative gingival bleeding, Neck:  Negative scars, masses, torticollis, lymphadenopathy, JVD, tracheostomy and placed Lungs: clear to auscultation bilateral, negative wheezing/crackles  Cardiovascular: Regular rate and rhythm without murmur gallop or rub normal S1 and S2 Abdomen: MORBIDLY OBESE, negative abdominal pain, nondistended, positive soft, bowel sounds, no rebound, no ascites, no appreciable mass Extremities: No significant cyanosis, clubbing, bilateral lower extremity obesity with edema bilaterally  Skin: Negative rashes, lesions, ulcers Psychiatric:  Negative depression, negative anxiety, negative fatigue, negative mania  Central nervous system:  Cranial nerves II through XII intact, tongue/uvula midline, all extremities muscle strength 5/5, sensation intact throughout, negative receptive aphasia. Follows all commands, cooperates with exam  .     Data Reviewed: Care during  the described time interval was provided by me .  I have reviewed this patient's available data, including medical history, events of note, physical examination, and all test results as part of my evaluation. I have personally reviewed and interpreted all radiology studies.  CBC: No results for input(s): WBC, NEUTROABS, HGB, HCT, MCV, PLT in the last 168 hours. Basic Metabolic Panel:  Recent Labs Lab 06/19/16 0400 06/20/16 0610 06/21/16 0500 06/22/16 0637 06/24/16 0531  NA 146* 142 140 139 136  K 4.4 4.4 4.4 4.2 4.3  CL 106 102 97* 95* 89*  CO2 32 33* 33* 33* 35*  GLUCOSE 119* 126* 124* 98 105*  BUN 39* 31* 28* 29* 30*  CREATININE 1.05* 0.92 0.90 0.86 0.88  CALCIUM 9.8 9.8 9.7 9.7 9.7   GFR: Estimated Creatinine Clearance: 88.2 mL/min (by C-G formula based on SCr of 0.88 mg/dL). Liver Function Tests: No results for input(s): AST, ALT, ALKPHOS, BILITOT, PROT, ALBUMIN in the last 168 hours. No results for input(s): LIPASE, AMYLASE in the last 168 hours. No results for input(s):  AMMONIA in the last 168 hours. Coagulation Profile: No results for input(s): INR, PROTIME in the last 168 hours. Cardiac Enzymes: No results for input(s): CKTOTAL, CKMB, CKMBINDEX, TROPONINI in the last 168 hours. BNP (last 3 results) No results for input(s): PROBNP in the last 8760 hours. HbA1C: No results for input(s): HGBA1C in the last 72 hours. CBG:  Recent Labs Lab 06/23/16 0441 06/23/16 0821 06/23/16 1210 06/23/16 1621 06/23/16 2027  GLUCAP 127* 117* 145* 111* 112*   Lipid Profile: No results for input(s): CHOL, HDL, LDLCALC, TRIG, CHOLHDL, LDLDIRECT in the last 72 hours. Thyroid Function Tests: No results for input(s): TSH, T4TOTAL, FREET4, T3FREE, THYROIDAB in the last 72 hours. Anemia Panel: No results for input(s): VITAMINB12, FOLATE, FERRITIN, TIBC, IRON, RETICCTPCT in the last 72 hours. Urine analysis:    Component Value Date/Time   COLORURINE YELLOW 05/25/2016 1410    APPEARANCEUR CLEAR 05/25/2016 1410   LABSPEC 1.012 05/25/2016 1410   PHURINE 5.0 05/25/2016 1410   GLUCOSEU NEGATIVE 05/25/2016 1410   HGBUR NEGATIVE 05/25/2016 1410   BILIRUBINUR NEGATIVE 05/25/2016 1410   KETONESUR NEGATIVE 05/25/2016 1410   PROTEINUR NEGATIVE 05/25/2016 1410   UROBILINOGEN 1.0 12/12/2013 2146   NITRITE NEGATIVE 05/25/2016 1410   LEUKOCYTESUR NEGATIVE 05/25/2016 1410   Sepsis Labs: _0 (procalcitonin:4,lacticidven:4)  ) No results found for this or any previous visit (from the past 240 hour(s)).       Radiology Studies: No results found.      Scheduled Meds: . chlorhexidine gluconate (MEDLINE KIT)  15 mL Mouth Rinse BID  . enoxaparin (LOVENOX) injection  40 mg Subcutaneous Q24H  . famotidine  20 mg Per Tube BID  . feeding supplement (PRO-STAT SUGAR FREE 64)  30 mL Per Tube Daily  . furosemide  80 mg Per Tube TID  . hydrALAZINE  100 mg Oral Q8H  . isosorbide dinitrate  30 mg Per Tube BID  . labetalol  200 mg Per Tube BID  . mouth rinse  15 mL Mouth Rinse Q4H  . potassium chloride  20 mEq Per Tube BID   Continuous Infusions: . feeding supplement (GLUCERNA 1.2 CAL) 1,000 mL (06/24/16 0217)     LOS: 30 days    Time spent: 40 minutes    Jarris Kortz, Geraldo Docker, MD [facility] Pager [phone]   If 7PM-7AM, please contact night-coverage [email or web address] Password [email or web address] 06/24/2016, 10:09 AM

## 2016-06-24 NOTE — Progress Notes (Signed)
PULMONARY / CRITICAL CARE MEDICINE   Name: [name] [name] MRN: [id number] DOB: 1981/05/18    ADMISSION DATE:  05/25/2016 CONSULTATION DATE:  05/27/16  REFERRING MD:  Dr. Ree Kida - [facility]  CHIEF COMPLAINT:  Altered Mental Status   BRIEF: [name] with PMH of morbid obesity (469 lbs), DM, HTN and tachycardia who presented to [facility] on 10/3 with acute on chronic respiratory failure with hypercarbia and hypoxemia due to diastolic CHF exacerbation and obesity hypoventilation syndrome.  Ultimately required tracheostomy due to prolonged respiratory failure. Diuresed over 100 lbs.   SUBJECTIVE:  No acute events, continues to tolerate ATC but requiring 40-50% FiO2 (otherwise desaturates into 80's).  Has now been on ATC x 72 hours, but yet to be able to wean FiO2 < 40%. SLP planning to repeat FEES today 11/2.   VITAL SIGNS: BP 131/79   Pulse 88   Temp 98.3 F (36.8 C) (Oral)   Resp 16   Ht _0  (1.6 m)   Wt 175 lb (79.4 kg)   LMP  (LMP Unknown)   SpO2 92%   BMI 31.00 kg/m   HEMODYNAMICS:    VENTILATOR SETTINGS: FiO2 (%):  [30 %-50 %] 30 %  INTAKE / OUTPUT: I/O last 3 completed shifts: In: 1595 [NG/GT:1595] Out: 2875 [Urine:2875]  PHYSICAL EXAMINATION: General: Morbidly obese. No distress. Resting in bed on TC 40%. HENT: #6 proximal XLT trach c/d/i,  moist mucous membranes. No scleral icterus. PULM:   Resps even and unlabored.  Clear bilaterally. CV: Regular rate. No appreciable JVD given body habitus. GI:  Soft. Protuberant. Normal bowel sounds. Derm: Warm & dry. No rash on exposed skin. Neuro: Awake. Following commands. Grossly nonfocal & following commands.   LABS: PULMONARY  Recent Labs Lab 06/22/16 0432  PHART 7.419  PCO2ART 57.3*  PO2ART 135*  HCO3 36.3*  O2SAT 98.5     CBC No results for input(s): HGB, HCT, WBC, PLT in the last 168 hours. COAGULATION No results for input(s): INR in the last 168 hours.   CARDIAC   No results for input(s): TROPONINI in the  last 168 hours. No results for input(s): PROBNP in the last 168 hours.  CHEMISTRY  Recent Labs Lab 06/19/16 0400 06/20/16 0610 06/21/16 0500 06/22/16 0637 06/24/16 0531  NA 146* 142 140 139 136  K 4.4 4.4 4.4 4.2 4.3  CL 106 102 97* 95* 89*  CO2 32 33* 33* 33* 35*  GLUCOSE 119* 126* 124* 98 105*  BUN 39* 31* 28* 29* 30*  CREATININE 1.05* 0.92 0.90 0.86 0.88  CALCIUM 9.8 9.8 9.7 9.7 9.7   Estimated Creatinine Clearance: 88.2 mL/min (by C-G formula based on SCr of 0.88 mg/dL).  LIVER No results for input(s): AST, ALT, ALKPHOS, BILITOT, PROT, ALBUMIN, INR in the last 168 hours.  INFECTIOUS No results for input(s): LATICACIDVEN, PROCALCITON in the last 168 hours. ENDOCRINE CBG (last 3)   Recent Labs  06/23/16 1210 06/23/16 1621 06/23/16 2027  GLUCAP 145* 111* 112*    STUDIES:  TTE 10/14 >> EF 55%, PAP 42  MICROBIOLOGY: MRSA PCR 10/5 >>  Negative Tracheal Asp Ctx 10/17 >>  Oral Flora Blood Ctx x2 10/18 >>  Negative Tracheal Asp Ctx 10/19 >>  Oral Flora  ANTIBIOTICS: Vanc 10/18 - 10/22 Zosyn 10/18 - 10/24  SIGNIFICANT EVENTS: 10/03  Admit 10/05  Transferred to ICU due to AMS, hypercarbic respiratory failure 10/06  Bipap, ntg gtt & BP remains 160'F systolic.  Net neg 5.5 L in last 24  hours w improvement in sr cr 10/07  intubated overnight .  10/07 - 10/14 diuresed aggressively 10/14  Attempting wean 10/15  Weaning 10/18  febrile 10/21  Awake, following commands, PSV  10/25  Weaning on ATC 35% 10/31  No nocturnal ventilation  LINES: ETT 10/7 - 10/21 Trach  #6 10/21 >> R IJ TLC 10/10 >>  NGT R 10/21 >> Foley 10/7 >> PIV x1  ASSESSMENT/PLAN:  35 y.o. with PMH of super morbid obesity, HTN, pulmonary hypertension admitted with decompensated diastolic CHF.  Patient tolerating long periods of tracheostomy collar. Secretions are slowly improving. Tolerating ATC but requiring 40-50% FiO2.   Attempting to wean FiO2 and hope to start capping trials with  nocturnal CPAP.  Acute on Chronic Hypoxic & Hypercarbic Respiratory Failure - S/P tracheostomy.  Plan: Continue ATC 24/7 and attempt to wean FiO2 down. Only once O2 successfully weaned can we start capping trials with nocturnal CPAP.  Severe OSA/OHS. Plan: See above. If she were to ever be decanulated, would need to ensure NIPPV compliance.  Pulmonary Hypertension - Multifactorial.  Plan: Continue diuresis.  Dysphagia - in setting of vent/trach.  Continued aspiration per swallow 10/30  Suspect large bore NG may be contributing to swallow dysfunction Plan: SLP to repeat FEES today. Ultimately would benefit to have NG removed before FEES (however, risk of failing FEES then requiring NG to be reinserted - relayed this to pt and she will think about it more and discuss further with SLP).  Remainder of care as per primary service.  PCCM will follow peripherally while RT attempts to wean FiO2 down.  Please call sooner if new needs arise.    Montey Hora, [place] Pager: [phone]  or [phone] 06/24/2016, 11:17 AM

## 2016-06-25 DIAGNOSIS — F321 Major depressive disorder, single episode, moderate: Secondary | ICD-10-CM

## 2016-06-25 DIAGNOSIS — F325 Major depressive disorder, single episode, in full remission: Secondary | ICD-10-CM

## 2016-06-25 DIAGNOSIS — E1165 Type 2 diabetes mellitus with hyperglycemia: Secondary | ICD-10-CM

## 2016-06-25 DIAGNOSIS — E119 Type 2 diabetes mellitus without complications: Secondary | ICD-10-CM

## 2016-06-25 DIAGNOSIS — J9621 Acute and chronic respiratory failure with hypoxia: Secondary | ICD-10-CM

## 2016-06-25 DIAGNOSIS — E118 Type 2 diabetes mellitus with unspecified complications: Secondary | ICD-10-CM

## 2016-06-25 LAB — GLUCOSE, CAPILLARY
Glucose-Capillary: 135 mg/dL — ABNORMAL HIGH (ref 65–99)
Glucose-Capillary: 122 mg/dL — ABNORMAL HIGH (ref 65–99)
Glucose-Capillary: 135 mg/dL — ABNORMAL HIGH (ref 65–99)
Glucose-Capillary: 136 mg/dL — ABNORMAL HIGH (ref 65–99)
Glucose-Capillary: 130 mg/dL — ABNORMAL HIGH (ref 65–99)

## 2016-06-25 LAB — ANTI-DNA ANTIBODY, DOUBLE-STRANDED: ds DNA Ab: 15 IU/mL — ABNORMAL HIGH (ref 0–9)

## 2016-06-25 LAB — ANGIOTENSIN CONVERTING ENZYME: Angiotensin-Converting Enzyme: 34 U/L (ref 14–82)

## 2016-06-25 LAB — ANTINUCLEAR ANTIBODIES, IFA: ANA Ab, IFA: NEGATIVE

## 2016-06-25 MED ORDER — FAMOTIDINE 20 MG PO TABS
20.0000 mg | ORAL_TABLET | Freq: Two times a day (BID) | ORAL | Status: DC
  Administered 2016-06-25 – 2016-06-29 (×8): 20 mg via ORAL
  Filled 2016-06-25 (×9): qty 1

## 2016-06-25 MED ORDER — RESOURCE THICKENUP CLEAR PO POWD
Freq: Once | ORAL | Status: AC
  Administered 2016-06-25: 14:00:00 via ORAL
  Filled 2016-06-25: qty 125

## 2016-06-25 MED ORDER — DIPHENOXYLATE-ATROPINE 2.5-0.025 MG PO TABS: 1.0000 | ORAL_TABLET | Freq: Two times a day (BID) | ORAL | Status: DC | PRN

## 2016-06-25 MED ORDER — POTASSIUM CHLORIDE CRYS ER 20 MEQ PO TBCR
20.0000 meq | EXTENDED_RELEASE_TABLET | Freq: Two times a day (BID) | ORAL | Status: DC
  Administered 2016-06-25 – 2016-06-29 (×8): 20 meq via ORAL
  Filled 2016-06-25 (×8): qty 1

## 2016-06-25 NOTE — Progress Notes (Signed)
Physical Therapy Treatment Patient Details Name: [name] [name] MRN: [id number] DOB: 07-06-1981 Today's Date: 06/25/2016    History of Present Illness [name] with PMH of morbid obesity (469 lbs), DM, HTN and tachycardia who presented to [facility] on 10/3 with reports of abdominal bloating and heavy abdominal pressure.  Intubated 05/29/16 to present, trached 06/10/16.    PT Comments    Patient seen for mobility progression. Patient eager to participate and tolerated session well. Focused on function transfer training, pre gait and gait mobility tasks (using UE assist and facilitation for neuromuscular engagement) and tolerated extended therapeutic exercises in the bed, in sitting and in standing. At this time, feel patient is an ideal candidate for intense comprehensive therapies. Patient remains up in chair upon conclusion of session. Continue to recommend CIR upon acute discharge.  Follow Up Recommendations  CIR     Equipment Recommendations  Other (comment) (TBA)    Recommendations for Other Services       Precautions / Restrictions Precautions Precautions: Fall Precaution Comments: trach collar with PSMV Restrictions Weight Bearing Restrictions: No Other Position/Activity Restrictions: Unable to get sara stedy under Vital Go bed to attempt stand pivot    Mobility  Bed Mobility Overal bed mobility: Needs Assistance Bed Mobility: Supine to Sit;Sit to Supine Rolling: Min assist   Supine to sit: Min assist     General bed mobility comments: Min assist to power up to sitting with HOB elevated, reliance on UE pull to sit with cues for sequencing of LEs to EOB. Tolerated well.  Transfers Overall transfer level: Needs assistance Equipment used: Rolling walker (2 wheeled) Transfers: Sit to/from Stand Sit to Stand: +2 physical assistance;Mod assist         General transfer comment: Mon assist for sit <> stand performed x5 this session as therapeutic functional exercise. Patient  tolerated well. Assist for stability and initial power up to stand.  Ambulation/Gait Ambulation/Gait assistance: Mod assist Ambulation Distance (Feet): 12 Feet Assistive device:  (with bilateral UE support on chair ) Gait Pattern/deviations: Step-to pattern;Decreased stride length;Trunk flexed;Wide base of support Gait velocity: decreased Gait velocity interpretation: Below normal speed for age/gender General Gait Details: VCs for quad setting with step and weight shift, tolaterated well with improved cleanence   Stairs            Wheelchair Mobility    Modified Rankin (Stroke Patients Only)       Balance     Sitting balance-Leahy Scale: Good Sitting balance - Comments: tolerated dynamic EOB sitting for therapeutic exercise   Standing balance support: Bilateral upper extremity supported Standing balance-Leahy Scale: Poor                      Cognition Arousal/Alertness: Awake/alert Behavior During Therapy: WFL for tasks assessed/performed Overall Cognitive Status: Within Functional Limits for tasks assessed                      Exercises General Exercises - Lower Extremity Ankle Circles/Pumps: AROM;Both;5 reps;Supine Quad Sets: AROM;Both;10 reps;Supine Long Arc Quad: AROM;Both;10 reps;Seated Hip Flexion/Marching: AROM;Both;10 reps;Standing Mini-Sqauts: AROM;Both;10 reps;Standing    General Comments        Pertinent Vitals/Pain Pain Assessment: No/denies pain    Home Living                      Prior Function            PT Goals (current goals can now be  found in the care plan section) Acute Rehab PT Goals Patient Stated Goal: get NG tube out PT Goal Formulation: With patient Time For Goal Achievement: 07/06/16 Potential to Achieve Goals: Good Progress towards PT goals: Progressing toward goals    Frequency    Min 3X/week      PT Plan Discharge plan needs to be updated    Co-evaluation             End of  Session Equipment Utilized During Treatment: Gait belt;Oxygen Activity Tolerance: Patient tolerated treatment well Patient left: in chair;with call bell/phone within reach     Time: 1434-1458 PT Time Calculation (min) (ACUTE ONLY): 24 min  Charges:  $Gait Training: 8-22 mins $Therapeutic Exercise: 8-22 mins                    G CodesDuncan Dull 2016-07-25, 4:40 PM Alben Deeds, [place] DPT  [phone]

## 2016-06-25 NOTE — Progress Notes (Signed)
I met with pt at bedside to discuss an inpt rehab admission pending insurance approval when pt medically ready. She is in agreement to this plan. Noted there is a question of need for cardiac cath next week. I await clarification of final medical workup completion before I pursue insurance approval. [phone]

## 2016-06-25 NOTE — Procedures (Signed)
Objective Swallowing Evaluation: Type of Study: FEES-Fiberoptic Endoscopic Evaluation of Swallow  Patient Details  Name: [name] [name] MRN: [id number] Date of Birth: 11-11-1980  Today's Date: 06/25/2016 Time: SLP Start Time (ACUTE ONLY): 0910-SLP Stop Time (ACUTE ONLY): 1000 SLP Time Calculation (min) (ACUTE ONLY): 50 min  Past Medical History:  Past Medical History:  Diagnosis Date  . Diabetes mellitus without complication (HCC)    GDM  . Gestational diabetes mellitus in pregnancy 05/24/2013  . HTN in pregnancy, chronic 05/24/2013  . Hypertension   . Morbid obesity ([place])   . Sinus tachycardia    Past Surgical History:  Past Surgical History:  Procedure Laterality Date  . TRACHEOSTOMY TUBE PLACEMENT N/A 06/11/2016   Procedure: TRACHEOSTOMY;  Surgeon: Melida Quitter, MD;  Location: [facility] OR;  Service: ENT;  Laterality: N/A;   HPI: [name] with PMH of morbid obesity (469 lbs), DM, HTN and tachycardia who presented to [facility] on 10/3 with acute on chronic respiratory failure with hypercarbia and hypoxemia due to diastolic CHF exacerbation and obesity hypoventilation syndrome.  Ultimately required tracheostomy due to prolonged respiratory failure. Diuresed over 100 lbs.  ETT 10/7 - 10/21; trach xl#6 10/21  Subjective: pleasant, using PMV for study   Assessment / Plan / Recommendation  CHL IP CLINICAL IMPRESSIONS 06/25/2016  Therapy Diagnosis Moderate pharyngeal phase dysphagia  Clinical Impression Pt presents with a moderate pharyngeal dysphagia.  PMV in place for study.  NG removed prior to study. Fewer standing secretions compared to last week's FEES; vocal folds are visible during today's test, revealing posterior glottal chink and granulomatous tissue posterior left vocal fold.  Pt demonstrates swallow delay, generally to pyriform sinuses, with pharyngeal weakness requiring multiple swallows (4-6) to clear residue post-initial swallow.  There was trace penetration of residue inconsistently  reaching the posterior vocal folds, but no aspiration was visualized.  Throat-clearing helped to eject penetrate.  Eric Form, NP, present for study and we discussed findings/recs.  Plan to initiate conservative diet of dysphagia 1, honey-thick liquids.  Meds crushed in puree.  Pt reviewed study after completion; we discussed results/recs.  She is cognizant of risk and agrees with plan.   Impact on safety and function Moderate aspiration risk      CHL IP TREATMENT RECOMMENDATION 06/25/2016  Treatment Recommendations Therapy as outlined in treatment plan below     Prognosis 06/25/2016  Prognosis for Safe Diet Advancement Good  Barriers to Reach Goals --  Barriers/Prognosis Comment --    CHL IP DIET RECOMMENDATION 06/25/2016  SLP Diet Recommendations Dysphagia 1 (Puree) solids;Honey thick liquids  Liquid Administration via Cup  Medication Administration Crushed with puree  Compensations Slow rate;Small sips/bites;Clear throat intermittently  Postural Changes Seated upright at 90 degrees      CHL IP OTHER RECOMMENDATIONS 06/25/2016  Recommended Consults --  Oral Care Recommendations Oral care BID  Other Recommendations Order thickener from pharmacy      CHL IP FOLLOW UP RECOMMENDATIONS 06/22/2016  Follow up Recommendations LTACH      CHL IP FREQUENCY AND DURATION 06/25/2016  Speech Therapy Frequency (ACUTE ONLY) min 3x week  Treatment Duration 2 weeks           CHL IP ORAL PHASE 06/25/2016  Oral Phase WFL  Oral - Pudding Teaspoon --  Oral - Pudding Cup --  Oral - Honey Teaspoon --  Oral - Honey Cup --  Oral - Nectar Teaspoon --  Oral - Nectar Cup --  Oral - Nectar Straw --  Oral - Thin  Teaspoon --  Oral - Thin Cup --  Oral - Thin Straw --  Oral - Puree --  Oral - Mech Soft --  Oral - Regular --  Oral - Multi-Consistency --  Oral - Pill --  Oral Phase - Comment --    CHL IP PHARYNGEAL PHASE 06/25/2016  Pharyngeal Phase Impaired  Pharyngeal- Pudding Teaspoon --   Pharyngeal --  Pharyngeal- Pudding Cup --  Pharyngeal --  Pharyngeal- Honey Teaspoon Reduced airway/laryngeal closure;Penetration/Apiration after swallow;Pharyngeal residue - valleculae;Pharyngeal residue - pyriform;Delayed swallow initiation-pyriform sinuses  Pharyngeal Material enters airway, CONTACTS cords and not ejected out;Material enters airway, CONTACTS cords and then ejected out  Pharyngeal- Honey Cup --  Pharyngeal --  Pharyngeal- Nectar Teaspoon Reduced airway/laryngeal closure;Penetration/Apiration after swallow;Pharyngeal residue - valleculae;Pharyngeal residue - pyriform;Delayed swallow initiation-pyriform sinuses  Pharyngeal Material enters airway, CONTACTS cords and not ejected out  Pharyngeal- Nectar Cup --  Pharyngeal --  Pharyngeal- Nectar Straw --  Pharyngeal --  Pharyngeal- Thin Teaspoon NT  Pharyngeal --  Pharyngeal- Thin Cup --  Pharyngeal --  Pharyngeal- Thin Straw --  Pharyngeal --  Pharyngeal- Puree Delayed swallow initiation-vallecula;Pharyngeal residue - valleculae;Pharyngeal residue - pyriform  Pharyngeal Material enters airway, remains ABOVE vocal cords then ejected out  Pharyngeal- Mechanical Soft --  Pharyngeal --  Pharyngeal- Regular Delayed swallow initiation-vallecula;Pharyngeal residue - valleculae;Pharyngeal residue - pyriform  Pharyngeal --  Pharyngeal- Multi-consistency --  Pharyngeal --  Pharyngeal- Pill --  Pharyngeal --  Pharyngeal Comment --     No flowsheet data found.  No flowsheet data found.  Juan Quam Laurice 06/25/2016, 10:30 AM

## 2016-06-25 NOTE — Progress Notes (Signed)
CSW continuing to follow for possible SNF placement- per MD notes possible heart cath next week and hopeful to begin capping trials- CSW will reassess pt needs next week and resend SNF referral if appropriate.  Jorge Ny, LCSW Clinical Social Worker [phone]

## 2016-06-25 NOTE — Care Management Note (Addendum)
Case Management Note Original Note created by [facility] 06/22/16  Patient Details  Name: [name] [name] MRN: [id number] Date of Birth: 18-Nov-1980  Subjective/Objective:   Pt was kept on 40% TC overnight, plan is to keep her on 40% TC x 2 additional nights and then check ABGs.  CSW following for xfer to SNF for rehab.                          Expected Discharge Plan:  Skilled Nursing Facility  In-House Referral:  Clinical Social Work  Discharge planning Services  CM Consult  Status of Service:  In process, will continue to follow   Comments: 06/25/2016  Post initiating diet pt choked and almost displaced trach.  Attending feels it would be in pts best interest to remain in [place] overnight   CM informed during rounds that pt remains volume overloaded - cards recommending cath early next week. has been on ATC x 72 hours progress overnight to 28%, with some thickening of secretions noted.  Attending to follow up to determine if pt is medically stable for discharge to CIR/SNF.   Per attending pt is appropriate to transfer to floor today.   CSW working on placement.  CM will continue to follow for discharge     SUBJECTIVE:  No acute events, continues to tolerate ATC weaned down to 28%  FiO2 .  H. SLP  in room preparing to perform FEES.  Kevin Fenton, RN 06/25/2016, 1:46 PM

## 2016-06-25 NOTE — Progress Notes (Addendum)
PROGRESS NOTE    [name] [name]  VOP:[id number] DOB: April 26, 1981 DOA: 05/25/2016 PCP: No PCP Per Patient   Brief Narrative:  35 y.o.  BF PMHx HTN, Sinus tachycardia, Gestational DM, Diabetes Type 2 Uncontrolled with complications, Super Morbid Obesity, OSA, Chronic Respiratory Failure, Medical Noncompliance   Who reports a 1-2 week history of progressive dyspnea on exertion and orthopnea with lower extremity and abdominal wall edema.  She does not know how much weight she has gained in the past 2-3 months, but she knows that her weight is up.  She has had noticeable increased abdominal girth.  She explicitly denies chest pain, pressure, or tightness.  No light-headedness or loss of consciousness.   She admits to noncompliance with recommended treatment for hypertension for YEARS.    Subjective: 11/3  A/O 4, able to speak with PMV in place. Past  FEES patient extremely happy    Assessment & Plan:   Principal Problem:   Acute congestive heart failure (HCC) Active Problems:   Malignant hypertension   Morbid obesity (HCC)   OSA (obstructive sleep apnea)   Renal insufficiency   Acute pulmonary edema (HCC)   Abdominal pain   Hepatic congestion   Sinus tachycardia   Pericardial effusion   Pulmonary hypertension   Acute respiratory failure with hypoxemia (HCC)   Difficult intravenous access   Sinus pause   HCAP (healthcare-associated pneumonia)   Acute respiratory failure with hypoxia (HCC)   Acute on chronic respiratory failure with hypoxia and hypercapnia (HCC)   Tracheostomy status (HCC)   Acute diastolic CHF (congestive heart failure) (HCC)   Hypertensive urgency   Obesity hypoventilation syndrome ([place])   Acute kidney injury ([place])   Acute on chronic respiratory failure with hypoxia (HCC)   Moderate single current episode of major depressive disorder ([place])   Uncontrolled type 2 diabetes mellitus with complication (HCC)   Acute on Chronic Hypoxemic and Hypercarbic  Respiratory Failure  -S/p tracheostomy - ongoing vent care per PCCM   Acute Diastolic CHF  -Strict in and out since admission - 7.2 L -Daily weight Filed Weights   06/20/16 2144 06/21/16 0212 06/23/16 0700  Weight: 113.2 kg (249 lb 8 oz) 112.9 kg (249 lb) 79.4 kg (175 lb)  -Lasix 80 mg TID -Hydralazine 100 mg TID -Isosorbide dinitrate 30 mg BID -Labetalol 200 mg BID  Secondary pulmonary hypertension -As per CHF tx plan   Hypertensive Urgency -Resolved - BP currently well controlled - follow trend   Severe OSA / OHS with acute decompensation  -To continue QHS vent support PRN (has not required) - PCCM directing care   AKI / Azotemia 2/2 diuresis -Diuretic per cardiology  HCAP - completed 6 days of empiric abx tx   -Suction tracheostomy q shift  Demand ischemia -Cards following   Hypernatremia  -Resolved continue to monitor closely  Hypokalemia -Corrected  Morbid obesity - Body mass index is 50.75 kg/m.  Altered Mental Status -Resolved   Depression -family reports pt is in "denial" - seen by Psych 05/28/16  Diabetes type 2 uncontrolled with complication -63/8 Hemoglobin A1c= 7.9  Dysphagia -Resolved     DVT prophylaxis: Lovenox Code Status: Full Family Communication:  Disposition Plan: Per cardiology   Consultants:  Dr.Wesam G [place] [facility]     Procedures/Significant Events:  10/4 Echocardiogram: LVEF=:  50%to 55%.-- Left atrium:  mildly dilated.- Right atrium: mildly dilated. - Pulmonary arteries PA  peak pressure: 42 mm Hg (S). 10/05 Transferred to ICU due to AMS,  hypercarbic respiratory failure 10/06 Bipap, ntg gtt &BP remains elevated in 546'T systolic. Net neg 5.5 L in last 24 hours with improvement in sr cr 10/07 intubated overnight .  10/07 -10/14 diuresed aggressively 10/14 Attempting wean 10/15 Weaning 10/18 febrile 10/21 Awake, following commands, PSV   Cultures 10/5 MRSA PCR  negative 10/17 respiratory culture normal flora 10/18 blood left/right hand negative 10/19 tracheal aspirate normal flora   Antimicrobials: Vanc 10/18 -10/22 Zosyn 10/18 - 10/23   Devices    LINES / TUBES:  #6 cuffless extended trach>>    Continuous Infusions:     Objective: Vitals:   06/25/16 1537 06/25/16 1658 06/25/16 2008 06/25/16 2051  BP:  135/89  128/85  Pulse: 98 88 95 95  Resp: (!) 21 (!) 22 (!) 30 17  Temp:  98.4 F (36.9 C)  98.8 F (37.1 C)  TempSrc:  Oral  Oral  SpO2: 95%  92% 91%  Weight:      Height:        Intake/Output Summary (Last 24 hours) at 06/25/16 2118 Last data filed at 06/25/16 1815  Gross per 24 hour  Intake              880 ml  Output             1150 ml  Net             -270 ml   Filed Weights   06/20/16 2144 06/21/16 0212 06/23/16 0700  Weight: 113.2 kg (249 lb 8 oz) 112.9 kg (249 lb) 79.4 kg (175 lb)    Examination:  General: A/O 4, NAD, Positive acute respiratory distress Eyes: negative scleral hemorrhage, negative anisocoria, negative icterus ENT: Negative Runny nose, negative gingival bleeding, Neck:  Negative scars, masses, torticollis, lymphadenopathy, JVD, tracheostomy and placed Lungs: clear to auscultation bilateral, negative wheezing/crackles  Cardiovascular: Regular rate and rhythm without murmur gallop or rub normal S1 and S2 Abdomen: MORBIDLY OBESE, negative abdominal pain, nondistended, positive soft, bowel sounds, no rebound, no ascites, no appreciable mass Extremities: No significant cyanosis, clubbing, bilateral lower extremity obesity with edema bilaterally  Skin: Negative rashes, lesions, ulcers Psychiatric:  Negative depression, negative anxiety, negative fatigue, negative mania  Central nervous system:  Cranial nerves II through XII intact, tongue/uvula midline, all extremities muscle strength 5/5, sensation intact throughout, negative receptive aphasia. Follows all commands, cooperates with exam  .       Data Reviewed: Care during the described time interval was provided by me .  I have reviewed this patient's available data, including medical history, events of note, physical examination, and all test results as part of my evaluation. I have personally reviewed and interpreted all radiology studies.  CBC: No results for input(s): WBC, NEUTROABS, HGB, HCT, MCV, PLT in the last 168 hours. Basic Metabolic Panel:  Recent Labs Lab 06/19/16 0400 06/20/16 0610 06/21/16 0500 06/22/16 0637 06/24/16 0531  NA 146* 142 140 139 136  K 4.4 4.4 4.4 4.2 4.3  CL 106 102 97* 95* 89*  CO2 32 33* 33* 33* 35*  GLUCOSE 119* 126* 124* 98 105*  BUN 39* 31* 28* 29* 30*  CREATININE 1.05* 0.92 0.90 0.86 0.88  CALCIUM 9.8 9.8 9.7 9.7 9.7   GFR: Estimated Creatinine Clearance: 88.2 mL/min (by C-G formula based on SCr of 0.88 mg/dL). Liver Function Tests: No results for input(s): AST, ALT, ALKPHOS, BILITOT, PROT, ALBUMIN in the last 168 hours. No results for input(s): LIPASE, AMYLASE in the last 168 hours. No  results for input(s): AMMONIA in the last 168 hours. Coagulation Profile: No results for input(s): INR, PROTIME in the last 168 hours. Cardiac Enzymes: No results for input(s): CKTOTAL, CKMB, CKMBINDEX, TROPONINI in the last 168 hours. BNP (last 3 results) No results for input(s): PROBNP in the last 8760 hours. HbA1C: No results for input(s): HGBA1C in the last 72 hours. CBG:  Recent Labs Lab 06/25/16 0558 06/25/16 0820 06/25/16 1130 06/25/16 1702 06/25/16 2051  GLUCAP 135* 122* 135* 136* 130*   Lipid Profile: No results for input(s): CHOL, HDL, LDLCALC, TRIG, CHOLHDL, LDLDIRECT in the last 72 hours. Thyroid Function Tests: No results for input(s): TSH, T4TOTAL, FREET4, T3FREE, THYROIDAB in the last 72 hours. Anemia Panel: No results for input(s): VITAMINB12, FOLATE, FERRITIN, TIBC, IRON, RETICCTPCT in the last 72 hours. Urine analysis:    Component Value Date/Time    COLORURINE YELLOW 05/25/2016 1410   APPEARANCEUR CLEAR 05/25/2016 1410   LABSPEC 1.012 05/25/2016 1410   PHURINE 5.0 05/25/2016 1410   GLUCOSEU NEGATIVE 05/25/2016 1410   HGBUR NEGATIVE 05/25/2016 1410   BILIRUBINUR NEGATIVE 05/25/2016 1410   KETONESUR NEGATIVE 05/25/2016 1410   PROTEINUR NEGATIVE 05/25/2016 1410   UROBILINOGEN 1.0 12/12/2013 2146   NITRITE NEGATIVE 05/25/2016 1410   LEUKOCYTESUR NEGATIVE 05/25/2016 1410   Sepsis Labs: _0 (procalcitonin:4,lacticidven:4)  ) No results found for this or any previous visit (from the past 240 hour(s)).       Radiology Studies: No results found.      Scheduled Meds: . chlorhexidine gluconate (MEDLINE KIT)  15 mL Mouth Rinse BID  . enoxaparin (LOVENOX) injection  40 mg Subcutaneous Q24H  . famotidine  20 mg Oral BID  . feeding supplement (PRO-STAT SUGAR FREE 64)  30 mL Per Tube Daily  . furosemide  80 mg Per Tube TID  . hydrALAZINE  100 mg Oral Q8H  . isosorbide dinitrate  30 mg Per Tube BID  . labetalol  200 mg Per Tube BID  . mouth rinse  15 mL Mouth Rinse Q4H  . potassium chloride  20 mEq Oral BID   Continuous Infusions:     LOS: 31 days    Time spent: 40 minutes    Passion Lavin, Geraldo Docker, MD [facility] Pager [phone]   If 7PM-7AM, please contact night-coverage [email or web address] Password [email or web address] 06/25/2016, 9:18 PM

## 2016-06-25 NOTE — Progress Notes (Signed)
PT Cancellation Note  Patient Details Name: [name] [name] MRN: [id number] DOB: March 06, 1981   Cancelled Treatment:    Reason Eval/Treat Not Completed: Patient at procedure or test/unavailable, will follow   Duncan Dull 06/25/2016, 11:58 AM Alben Deeds, PT DPT  [phone]

## 2016-06-25 NOTE — Progress Notes (Signed)
PULMONARY / CRITICAL CARE MEDICINE   Name: [name] [name] MRN: [id number] DOB: 09-09-1980    ADMISSION DATE:  05/25/2016 CONSULTATION DATE:  05/27/16  REFERRING MD:  Dr. Ree Kida - [facility]  CHIEF COMPLAINT:  Altered Mental Status   BRIEF: [name] with PMH of morbid obesity (469 lbs), DM, HTN and tachycardia who presented to [facility] on 10/3 with acute on chronic respiratory failure with hypercarbia and hypoxemia due to diastolic CHF exacerbation and obesity hypoventilation syndrome.  Ultimately required tracheostomy due to prolonged respiratory failure. Diuresed over 100 lbs. This admission.  SUBJECTIVE:  No acute events, continues to tolerate ATC weaned down to 28%  FiO2 .  Has now been on ATC x 72 hours progress overnight to 28%, with some thickening of secretions noted. SLP  in room preparing to perform FEES.   VITAL SIGNS: BP (!) 145/92 (BP Location: Right Wrist)   Pulse 96   Temp 98.3 F (36.8 C) (Oral)   Resp 16   Ht 5' 3" (1.6 m)   Wt 175 lb (79.4 kg)   LMP  (LMP Unknown)   SpO2 93%   BMI 31.00 kg/m   HEMODYNAMICS:    VENTILATOR SETTINGS: FiO2 (%):  [28 %-30 %] 28 %  INTAKE / OUTPUT: I/O last 3 completed shifts: In: 1980 [NG/GT:1980] Out: 3475 [Urine:3475]  PHYSICAL EXAMINATION: General: Morbidly obese. No distress. Resting in bed on ATC 28%. HENT: #6 proximal XLT trach c/d/i,  moist mucous membranes. No scleral icterus.Strong cough PULM:   Resps even and unlabored.  Clear bilaterally. CV: Regular rate. No appreciable JVD given body habitus. GI:  Soft. Protuberant. Normal bowel sounds. Derm: Warm & dry. No rash on exposed skin. Neuro: Awake. Following commands. Grossly nonfocal & following commands.   LABS: PULMONARY  Recent Labs Lab 06/22/16 0432  PHART 7.419  PCO2ART 57.3*  PO2ART 135*  HCO3 36.3*  O2SAT 98.5     CBC No results for input(s): HGB, HCT, WBC, PLT in the last 168 hours. COAGULATION No results for input(s): INR in the last 168 hours.    CARDIAC   No results for input(s): TROPONINI in the last 168 hours. No results for input(s): PROBNP in the last 168 hours.  CHEMISTRY  Recent Labs Lab 06/19/16 0400 06/20/16 0610 06/21/16 0500 06/22/16 0637 06/24/16 0531  NA 146* 142 140 139 136  K 4.4 4.4 4.4 4.2 4.3  CL 106 102 97* 95* 89*  CO2 32 33* 33* 33* 35*  GLUCOSE 119* 126* 124* 98 105*  BUN 39* 31* 28* 29* 30*  CREATININE 1.05* 0.92 0.90 0.86 0.88  CALCIUM 9.8 9.8 9.7 9.7 9.7   Estimated Creatinine Clearance: 88.2 mL/min (by C-G formula based on SCr of 0.88 mg/dL).  LIVER No results for input(s): AST, ALT, ALKPHOS, BILITOT, PROT, ALBUMIN, INR in the last 168 hours.  INFECTIOUS No results for input(s): LATICACIDVEN, PROCALCITON in the last 168 hours. ENDOCRINE CBG (last 3)   Recent Labs  06/24/16 1932 06/25/16 0558 06/25/16 0820  GLUCAP 130* 135* 122*    STUDIES:  TTE 10/14 >> EF 55%, PAP 42  MICROBIOLOGY: MRSA PCR 10/5 >>  Negative Tracheal Asp Ctx 10/17 >>  Oral Flora Blood Ctx x2 10/18 >>  Negative Tracheal Asp Ctx 10/19 >>  Oral Flora  ANTIBIOTICS: Vanc 10/18 - 10/22 Zosyn 10/18 - 10/24  SIGNIFICANT EVENTS: 10/03  Admit 10/05  Transferred to ICU due to AMS, hypercarbic respiratory failure 10/06  Bipap, ntg gtt & BP remains 474'Q systolic.  Net neg 5.5 L in last 24 hours w improvement in sr cr 10/07  intubated overnight .  10/07 - 10/14 diuresed aggressively 10/14  Attempting wean 10/15  Weaning 10/18  febrile 10/21  Awake, following commands, PSV  10/25  Weaning on ATC 35% 10/31  No nocturnal ventilation 11/3    Weaned to 28 % ATC/ Passed FEES  LINES: ETT 10/7 - 10/21 Trach  #6 10/21 >> R IJ TLC 10/10 >>  NGT R 10/21 >> Foley 10/7 >> PIV x1  ASSESSMENT/PLAN:  35 y.o. with PMH of super morbid obesity, HTN, pulmonary hypertension admitted with decompensated diastolic CHF.  Patient tolerating long periods of tracheostomy collar. Secretions are slowly improving. Tolerating ATC  but requiring 40-50% FiO2.   Attempting to wean FiO2 and hope to start capping trials with nocturnal CPAP.Passed FEES 11/3, will start honey thick diet 11/3.  Acute on Chronic Hypoxic & Hypercarbic Respiratory Failure - S/P tracheostomy.  Plan: Continue ATC 24/7 and continue to wean oxygen for saturations >92% Only once O2 successfully weaned can we start capping trials with nocturnal CPAP. If she tolerates 28% or lower over weekend, consider capping trial 11/6. ABG's prn  Severe OSA/OHS. Plan: As  above. If she were to ever be decanulated, would need to ensure NIPPV compliance. She has been told compliance  is not an option.  Pulmonary Hypertension - Multifactorial.  Plan: Continue diuresis as creatinine tolerates.  Dysphagia - in setting of vent/trach.  Continued aspiration per swallow 10/30  Suspect large bore NG may be contributing to swallow dysfunction Plan: SLP  repeated FEES today ( 11/3). NG has been removed to give her the best chance at succeeding.There was minimal penetration of the vocal cords, and Speech feels we can start conservative feedings.Speech will order appropriate diet. CXR 11/6 to assure no aspiration after feeds initiated.  Remainder of care as per primary service.  PCCM will follow peripherally while RT attempts to wean FiO2 down.  Please call sooner if new needs arise.    Magdalen Spatz, AGACNP-BC [place] Pager:  [phone] 06/25/2016, 9:18 AM

## 2016-06-26 LAB — GLUCOSE, CAPILLARY
Glucose-Capillary: 109 mg/dL — ABNORMAL HIGH (ref 65–99)
Glucose-Capillary: 126 mg/dL — ABNORMAL HIGH (ref 65–99)

## 2016-06-26 MED ORDER — PRO-STAT SUGAR FREE PO LIQD
30.0000 mL | Freq: Two times a day (BID) | ORAL | Status: DC
  Administered 2016-06-26 – 2016-06-29 (×2): 30 mL via ORAL
  Filled 2016-06-26 (×4): qty 30

## 2016-06-26 MED ORDER — ADULT MULTIVITAMIN W/MINERALS CH
1.0000 | ORAL_TABLET | Freq: Every day | ORAL | Status: DC
  Administered 2016-06-26 – 2016-06-29 (×4): 1 via ORAL
  Filled 2016-06-26 (×5): qty 1

## 2016-06-26 NOTE — Plan of Care (Signed)
Problem: Health Behavior/Discharge Planning: Goal: Ability to manage health-related needs will improve Outcome: Progressing Patient aware of plan to transfer to CIR after the weekend. She is agreeable to this.

## 2016-06-26 NOTE — Plan of Care (Signed)
Problem: Education: Goal: Knowledge of [facility] Education information/materials will improve Outcome: Progressing Discussed with patient the difference between nectar and honey thick with teach back displayed.

## 2016-06-26 NOTE — Plan of Care (Signed)
Problem: Education: Goal: Knowledge of [facility] Education information/materials will improve Outcome: Progressing Discussed plan of care for the evening and how much better patient was doing and new diet with teach back displayed.

## 2016-06-26 NOTE — Progress Notes (Signed)
Nutrition Follow-up  DOCUMENTATION CODES:   Morbid obesity  INTERVENTION:  Provide snacks TID Provide Pro-stat BID, each dose provides 100 kcal and 15 grams of protein Multivitamin with minerals daily   NUTRITION DIAGNOSIS:   Inadequate oral intake related to inability to eat as evidenced by NPO status.  Ongoing/progressing (diet advanced, oral intake remains inadequate)  GOAL:   Patient will meet greater than or equal to 90% of their needs  Unmet  MONITOR:   Diet advancement, TF tolerance, Vent status, Labs, Weight trends, I & O's  REASON FOR ASSESSMENT:   Consult Poor PO  ASSESSMENT:   [name] with PMH of morbid obesity (469 lbs), DM, HTN and tachycardia who presented to [facility] on 10/3 with reports of abdominal bloating and heavy abdominal pressure.    Pt passed FEES yesterday and was advanced to a dysphagia 1 diet with honey-thick liquids. RD consulted due to poor PO intake. Per nursing notes, pt ate 10% of lunch and 10% of dinner yesterday. Pt reports eating a small amount of grits this morning. She denies any abdominal pain, nausea, or swallowing difficulty. Discussed supplement options. Pt prefers to receive snacks TID. Current weight is inaccurate (pt weighed 304 lbs one week ago). Pt reports usual body weight of 400 lbs.  Repeated nutrition-focused physical exam; no signs of wasting, pt continues to appear obese).   Labs reviewed.   Diet Order:  DIET - DYS 1 Room service appropriate? Yes; Fluid consistency: Honey Thick  Skin:  Wound (see comment) (stage II wound on buttocks; shallow wound on R/L post leg)  Last BM:  11/3  Height:   Ht Readings from Last 1 Encounters:  06/08/16 _0  (1.6 m)    Weight:   Wt Readings from Last 1 Encounters:  06/26/16 151 lb 3.8 oz (68.6 kg)  (weight inaccurate)  Ideal Body Weight:  52.27 kg  BMI:  Body mass index is 26.79 kg/m. (inaccurate BMI due to inaccurate weight)  Estimated Nutritional Needs:   Kcal:   1600-1800  Protein:  95-105 gm  Fluid:  1.6-1.8 L  EDUCATION NEEDS:   No education needs identified at this time  Scarlette Ar RD, CSP, LDN Inpatient Clinical Dietitian Pager: [phone] After Hours Pager: [phone]

## 2016-06-26 NOTE — Progress Notes (Signed)
PROGRESS NOTE    [name] [name]  MRN:[id number] DOB: 03/18/1981 DOA: 05/25/2016 PCP: No PCP Per Patient   Brief Narrative:  34 y.o.  BF PMHx HTN, Sinus tachycardia, Gestational DM, Diabetes Type 2 Uncontrolled with complications, Super Morbid Obesity, OSA, Chronic Respiratory Failure, Medical Noncompliance   Who reports a 1-2 week history of progressive dyspnea on exertion and orthopnea with lower extremity and abdominal wall edema.  She does not know how much weight she has gained in the past 2-3 months, but she knows that her weight is up.  She has had noticeable increased abdominal girth.  She explicitly denies chest pain, pressure, or tightness.  No light-headedness or loss of consciousness.   She admits to noncompliance with recommended treatment for hypertension for YEARS.    Subjective: 11/4  A/O 4, able to speak with PMV in place. Patient does not like food but is happy that she is eating. Negative postprandial N/V, negative CP.    Assessment & Plan:   Principal Problem:   Acute congestive heart failure (HCC) Active Problems:   Malignant hypertension   Morbid obesity (HCC)   OSA (obstructive sleep apnea)   Renal insufficiency   Acute pulmonary edema (HCC)   Abdominal pain   Hepatic congestion   Sinus tachycardia   Pericardial effusion   Pulmonary hypertension   Acute respiratory failure with hypoxemia (HCC)   Difficult intravenous access   Sinus pause   HCAP (healthcare-associated pneumonia)   Acute respiratory failure with hypoxia (HCC)   Acute on chronic respiratory failure with hypoxia and hypercapnia (HCC)   Tracheostomy status (HCC)   Acute diastolic CHF (congestive heart failure) (HCC)   Hypertensive urgency   Obesity hypoventilation syndrome (HCC)   Acute kidney injury (HCC)   Acute on chronic respiratory failure with hypoxia (HCC)   Moderate single current episode of major depressive disorder (HCC)   Uncontrolled type 2 diabetes mellitus with  complication (HCC)   Acute on Chronic Hypoxemic and Hypercarbic Respiratory Failure  -S/p tracheostomy  - ongoing vent care per PCCM -Only once O2 successfully weaned can we start capping trials with nocturnal CPAP. If she tolerates 28% or lower over weekend, consider capping trial 11/6.  Acute Diastolic CHF  -Strict in and out since admission - 6.6 L -Daily weight Filed Weights   06/21/16 0212 06/23/16 0700 06/26/16 0500  Weight: 112.9 kg (249 lb) 79.4 kg (175 lb) 68.6 kg (151 lb 3.8 oz)  -Lasix 80 mg TID -Hydralazine 100 mg TID -Isosorbide dinitrate 30 mg BID -Labetalol 200 mg BID  Secondary pulmonary hypertension -As per CHF tx plan   Hypertensive Urgency -Resolved - BP currently well controlled - follow trend   Severe OSA / OHS with acute decompensation  -To continue QHS vent support PRN (has not required) - PCCM directing care   AKI / Azotemia 2/2 diuresis -Diuretic per cardiology  HCAP - completed 6 days of empiric abx tx   -Suction tracheostomy q shift  Demand ischemia -Cards following   Hypernatremia  -Resolved continue to monitor closely  Hypokalemia -Corrected  Morbid obesity - Body mass index is 50.75 kg/m.  Altered Mental Status -Resolved   Depression -family reports pt is in "denial" - seen by Psych 05/28/16  Diabetes type 2 uncontrolled with complication -10/3 Hemoglobin A1c= 7.9  Dysphagia -Resolved     DVT prophylaxis: Lovenox Code Status: Full Family Communication:  Disposition Plan: Per cardiology   Consultants:  Dr.Wesam G Yacoub PCCM Dr.Kenn[facility] C [facility]       Procedures/Significant Events:  10/4 Echocardiogram: LVEF=:  50%to 55%.-- Left atrium:  mildly dilated.- Right atrium: mildly dilated. - Pulmonary arteries PA  peak pressure: 42 mm Hg (S). 10/05 Transferred to ICU due to AMS, hypercarbic respiratory failure 10/06 Bipap, ntg gtt &BP remains elevated in 150's systolic. Net neg 5.5 L in last 24  hours with improvement in sr cr 10/07 intubated overnight .  10/07 -10/14 diuresed aggressively 10/14 Attempting wean 10/15 Weaning 10/18 febrile 10/21 Awake, following commands, PSV   Cultures 10/5 MRSA PCR negative 10/17 respiratory culture normal flora 10/18 blood left/right hand negative 10/19 tracheal aspirate normal flora   Antimicrobials: Vanc 10/18 -10/22 Zosyn 10/18 - 10/23   Devices    LINES / TUBES:  #6 cuffless extended trach>>    Continuous Infusions:     Objective: Vitals:   06/26/16 1200 06/26/16 1537 06/26/16 1647 06/26/16 1747  BP: 133/88  (!) 147/93 (!) 147/93  Pulse: 89 84    Resp: 19 14    Temp:    98.2 F (36.8 C)  TempSrc:    Oral  SpO2: 95% 94%    Weight:      Height:        Intake/Output Summary (Last 24 hours) at 06/26/16 1847 Last data filed at 06/26/16 1030  Gross per 24 hour  Intake            857.5 ml  Output             1250 ml  Net           -392.5 ml   Filed Weights   06/21/16 0212 06/23/16 0700 06/26/16 0500  Weight: 112.9 kg (249 lb) 79.4 kg (175 lb) 68.6 kg (151 lb 3.8 oz)    Examination:  General: A/O 4, NAD, Positive acute respiratory distress Eyes: negative scleral hemorrhage, negative anisocoria, negative icterus ENT: Negative Runny nose, negative gingival bleeding, Neck:  Negative scars, masses, torticollis, lymphadenopathy, JVD, tracheostomy and placed Lungs: clear to auscultation bilateral, negative wheezing/crackles  Cardiovascular: Regular rate and rhythm without murmur gallop or rub normal S1 and S2 Abdomen: MORBIDLY OBESE, negative abdominal pain, nondistended, positive soft, bowel sounds, no rebound, no ascites, no appreciable mass Extremities: No significant cyanosis, clubbing, bilateral lower extremity obesity with edema bilaterally  Skin: Negative rashes, lesions, ulcers Psychiatric:  Negative depression, negative anxiety, negative fatigue, negative mania  Central nervous system:  Cranial  nerves II through XII intact, tongue/uvula midline, all extremities muscle strength 5/5, sensation intact throughout, negative receptive aphasia. Follows all commands, cooperates with exam  .     Data Reviewed: Care during the described time interval was provided by me .  I have reviewed this patient's available data, including medical history, events of note, physical examination, and all test results as part of my evaluation. I have personally reviewed and interpreted all radiology studies.  CBC: No results for input(s): WBC, NEUTROABS, HGB, HCT, MCV, PLT in the last 168 hours. Basic Metabolic Panel:  Recent Labs Lab 06/20/16 0610 06/21/16 0500 06/22/16 0637 06/24/16 0531  NA 142 140 139 136  K 4.4 4.4 4.2 4.3  CL 102 97* 95* 89*  CO2 33* 33* 33* 35*  GLUCOSE 126* 124* 98 105*  BUN 31* 28* 29* 30*  CREATININE 0.92 0.90 0.86 0.88  CALCIUM 9.8 9.7 9.7 9.7   GFR: Estimated Creatinine Clearance: 82.2 mL/min (by C-G formula based on SCr of 0.88 mg/dL). Liver Function Tests: No results for input(s): AST, ALT, ALKPHOS, BILITOT, PROT, ALBUMIN   in the last 168 hours. No results for input(s): LIPASE, AMYLASE in the last 168 hours. No results for input(s): AMMONIA in the last 168 hours. Coagulation Profile: No results for input(s): INR, PROTIME in the last 168 hours. Cardiac Enzymes: No results for input(s): CKTOTAL, CKMB, CKMBINDEX, TROPONINI in the last 168 hours. BNP (last 3 results) No results for input(s): PROBNP in the last 8760 hours. HbA1C: No results for input(s): HGBA1C in the last 72 hours. CBG:  Recent Labs Lab 06/25/16 1130 06/25/16 1702 06/25/16 2051 06/26/16 0441 06/26/16 0810  GLUCAP 135* 136* 130* 109* 126*   Lipid Profile: No results for input(s): CHOL, HDL, LDLCALC, TRIG, CHOLHDL, LDLDIRECT in the last 72 hours. Thyroid Function Tests: No results for input(s): TSH, T4TOTAL, FREET4, T3FREE, THYROIDAB in the last 72 hours. Anemia Panel: No results for  input(s): VITAMINB12, FOLATE, FERRITIN, TIBC, IRON, RETICCTPCT in the last 72 hours. Urine analysis:    Component Value Date/Time   COLORURINE YELLOW 05/25/2016 1410   APPEARANCEUR CLEAR 05/25/2016 1410   LABSPEC 1.012 05/25/2016 1410   PHURINE 5.0 05/25/2016 1410   GLUCOSEU NEGATIVE 05/25/2016 1410   HGBUR NEGATIVE 05/25/2016 1410   BILIRUBINUR NEGATIVE 05/25/2016 1410   KETONESUR NEGATIVE 05/25/2016 1410   PROTEINUR NEGATIVE 05/25/2016 1410   UROBILINOGEN 1.0 12/12/2013 2146   NITRITE NEGATIVE 05/25/2016 1410   LEUKOCYTESUR NEGATIVE 05/25/2016 1410   Sepsis Labs: @LABRCNTIP(procalcitonin:4,lacticidven:4)  ) No results found for this or any previous visit (from the past 240 hour(s)).       Radiology Studies: No results found.      Scheduled Meds: . chlorhexidine gluconate (MEDLINE KIT)  15 mL Mouth Rinse BID  . enoxaparin (LOVENOX) injection  40 mg Subcutaneous Q24H  . famotidine  20 mg Oral BID  . feeding supplement (PRO-STAT SUGAR FREE 64)  30 mL Oral BID BM  . furosemide  80 mg Per Tube TID  . hydrALAZINE  100 mg Oral Q8H  . isosorbide dinitrate  30 mg Per Tube BID  . labetalol  200 mg Per Tube BID  . mouth rinse  15 mL Mouth Rinse Q4H  . multivitamin with minerals  1 tablet Oral Daily  . potassium chloride  20 mEq Oral BID   Continuous Infusions:     LOS: 32 days    Time spent: 40 minutes    WOODS, CURTIS J, MD [facility] Pager [phone]   If 7PM-7AM, please contact night-coverage [email or web address] Password [email or web address] 06/26/2016, 6:47 PM      

## 2016-06-27 LAB — BASIC METABOLIC PANEL
Anion gap: 13 (ref 5–15)
BUN: 35 mg/dL — ABNORMAL HIGH (ref 6–20)
CO2: 37 mmol/L — ABNORMAL HIGH (ref 22–32)
Calcium: 9.8 mg/dL (ref 8.9–10.3)
Chloride: 90 mmol/L — ABNORMAL LOW (ref 101–111)
Creatinine, Ser: 1.13 mg/dL — ABNORMAL HIGH (ref 0.44–1.00)
GFR calc Af Amer: >60 mL/min (ref >60)
GFR calc non Af Amer: >60 mL/min (ref >60)
Glucose, Bld: 108 mg/dL — ABNORMAL HIGH (ref 65–99)
Potassium: 4 mmol/L (ref 3.5–5.1)
Sodium: 140 mmol/L (ref 135–145)

## 2016-06-27 LAB — GLUCOSE, CAPILLARY
Glucose-Capillary: 112 mg/dL — ABNORMAL HIGH (ref 65–99)
Glucose-Capillary: 117 mg/dL — ABNORMAL HIGH (ref 65–99)
Glucose-Capillary: 160 mg/dL — ABNORMAL HIGH (ref 65–99)
Glucose-Capillary: 141 mg/dL — ABNORMAL HIGH (ref 65–99)
Glucose-Capillary: 101 mg/dL — ABNORMAL HIGH (ref 65–99)

## 2016-06-27 LAB — MAGNESIUM: Magnesium: 2.1 mg/dL (ref 1.7–2.4)

## 2016-06-27 MED ORDER — METOPROLOL TARTRATE 5 MG/5ML IV SOLN
7.5000 mg | Freq: Once | INTRAVENOUS | Status: AC
  Administered 2016-06-27: 7.5 mg via INTRAVENOUS

## 2016-06-27 MED ORDER — LABETALOL HCL 200 MG PO TABS
300.0000 mg | ORAL_TABLET | Freq: Two times a day (BID) | ORAL | Status: DC
  Administered 2016-06-27 – 2016-06-28 (×2): 300 mg
  Filled 2016-06-27 (×2): qty 2

## 2016-06-27 MED ORDER — METOPROLOL TARTRATE 5 MG/5ML IV SOLN
INTRAVENOUS | Status: AC
  Filled 2016-06-27: qty 5

## 2016-06-27 NOTE — Progress Notes (Signed)
PROGRESS NOTE    [name] [name]  YNW:[id number] DOB: 1980/10/30 DOA: 05/25/2016 PCP: No PCP Per Patient   Brief Narrative:  35 y.o.  BF PMHx HTN, Sinus tachycardia, Gestational DM, Diabetes Type 2 Uncontrolled with complications, Super Morbid Obesity, OSA, Chronic Respiratory Failure, Medical Noncompliance   Who reports a 1-2 week history of progressive dyspnea on exertion and orthopnea with lower extremity and abdominal wall edema.  She does not know how much weight she has gained in the past 2-3 months, but she knows that her weight is up.  She has had noticeable increased abdominal girth.  She explicitly denies chest pain, pressure, or tightness.  No light-headedness or loss of consciousness.   She admits to noncompliance with recommended treatment for hypertension for YEARS.    Subjective: 11/5   A/O 4, able to speak with PMV in place. Patient happy about prospect of going to CIR on Monday/Tuesday. Negative postprandial N/V, negative CP.    Assessment & Plan:   Principal Problem:   Acute congestive heart failure (HCC) Active Problems:   Malignant hypertension   Morbid obesity (HCC)   OSA (obstructive sleep apnea)   Renal insufficiency   Acute pulmonary edema (HCC)   Abdominal pain   Hepatic congestion   Sinus tachycardia   Pericardial effusion   Pulmonary hypertension   Acute respiratory failure with hypoxemia (HCC)   Difficult intravenous access   Sinus pause   HCAP (healthcare-associated pneumonia)   Acute respiratory failure with hypoxia (HCC)   Acute on chronic respiratory failure with hypoxia and hypercapnia (HCC)   Tracheostomy status (HCC)   Acute diastolic CHF (congestive heart failure) (HCC)   Hypertensive urgency   Obesity hypoventilation syndrome (HCC)   Acute kidney injury ([place])   Acute on chronic respiratory failure with hypoxia (HCC)   Moderate single current episode of major depressive disorder ([place])   Uncontrolled type 2 diabetes mellitus with  complication (HCC)   Acute on Chronic Hypoxemic and Hypercarbic Respiratory Failure  -S/p tracheostomy  - ongoing vent care per PCCM -Only once O2 successfully weaned can we start capping trials with nocturnal CPAP. If she tolerates 28% or lower over weekend, consider capping trial 11/6.  Acute Diastolic CHF  -Strict in and out since admission - 9.3 L -Daily weight Filed Weights   06/23/16 0700 06/26/16 0500 06/27/16 0300  Weight: 79.4 kg (175 lb) 111.3 kg (245 lb 6 oz) 112 kg (246 lb 14.6 oz)  -Lasix 80 mg TID -Hydralazine 100 mg TID -Isosorbide dinitrate 30 mg BID -Increase Labetalol 300 mg BID  Secondary pulmonary hypertension -As per CHF tx plan   Hypertensive Urgency -Resolved - BP currently well controlled - follow trend   Severe OSA / OHS with acute decompensation  -To continue QHS vent support PRN (has not required) - PCCM directing care   AKI / Azotemia 2/2 diuresis -Diuretic per cardiology  HCAP - completed 6 days of empiric abx tx   -Suction tracheostomy q shift  Demand ischemia -Cards following   Hypernatremia  -Resolved continue to monitor closely  Hypokalemia -Corrected  Morbid obesity - Body mass index is 50.75 kg/m. -11/5 start patient on 2000-calorie per day diet  Altered Mental Status -Resolved   Depression -family reports pt is in "denial" - seen by Psych 05/28/16  Diabetes type 2 uncontrolled with complication -65/7 Hemoglobin A1c= 7.9   Dysphagia -Resolved     DVT prophylaxis: Lovenox Code Status: Full Family Communication:  Disposition Plan: Per cardiology   Consultants:  Dr.Wesam  [facility]     Procedures/Significant Events:  10/4 Echocardiogram: LVEF=:  50%to 55%.-- Left atrium:  mildly dilated.- Right atrium: mildly dilated. - Pulmonary arteries PA  peak pressure: 42 mm Hg (S). 10/05 Transferred to ICU due to AMS, hypercarbic respiratory failure 10/06 Bipap, ntg gtt  &BP remains elevated in 584'Y systolic. Net neg 5.5 L in last 24 hours with improvement in sr cr 10/07 intubated overnight .  10/07 -10/14 diuresed aggressively 10/14 Attempting wean 10/15 Weaning 10/18 febrile 10/21 Awake, following commands, PSV   Cultures 10/5 MRSA PCR negative 10/17 respiratory culture normal flora 10/18 blood left/right hand negative 10/19 tracheal aspirate normal flora   Antimicrobials: Vanc 10/18 -10/22 Zosyn 10/18 - 10/23   Devices    LINES / TUBES:  #6 cuffless extended trach>>    Continuous Infusions:    Objective: Vitals:   06/27/16 0552 06/27/16 0600 06/27/16 0743 06/27/16 0749  BP: (!) 145/101 (!) 143/99  (!) 141/93  Pulse: 81 82 81 85  Resp: _0 Temp:    98.1 F (36.7 C)  TempSrc:    Oral  SpO2: 96% 94% 96% 95%  Weight:      Height:        Intake/Output Summary (Last 24 hours) at 06/27/16 1712 Last data filed at 06/27/16 0751  Gross per 24 hour  Intake             1380 ml  Output             1450 ml  Net              -70 ml   Filed Weights   06/23/16 0700 06/26/16 0500 06/27/16 0300  Weight: 79.4 kg (175 lb) 111.3 kg (245 lb 6 oz) 112 kg (246 lb 14.6 oz)    Examination:  General: A/O 4, NAD, Positive acute respiratory distress Eyes: negative scleral hemorrhage, negative anisocoria, negative icterus ENT: Negative Runny nose, negative gingival bleeding, Neck:  Negative scars, masses, torticollis, lymphadenopathy, JVD, tracheostomy and placed Lungs: clear to auscultation bilateral, negative wheezing/crackles  Cardiovascular: Regular rate and rhythm without murmur gallop or rub normal S1 and S2 Abdomen: MORBIDLY OBESE, negative abdominal pain, nondistended, positive soft, bowel sounds, no rebound, no ascites, no appreciable mass Extremities: No significant cyanosis, clubbing, bilateral lower extremity obesity with edema bilaterally  Skin: Negative rashes, lesions, ulcers Psychiatric:  Negative  depression, negative anxiety, negative fatigue, negative mania  Central nervous system:  Cranial nerves II through XII intact, tongue/uvula midline, all extremities muscle strength 5/5, sensation intact throughout, negative receptive aphasia. Follows all commands, cooperates with exam  .     Data Reviewed: Care during the described time interval was provided by me .  I have reviewed this patient's available data, including medical history, events of note, physical examination, and all test results as part of my evaluation. I have personally reviewed and interpreted all radiology studies.  CBC: No results for input(s): WBC, NEUTROABS, HGB, HCT, MCV, PLT in the last 168 hours. Basic Metabolic Panel:  Recent Labs Lab 06/21/16 0500 06/22/16 0637 06/24/16 0531 06/27/16 0300  NA 140 139 136 140  K 4.4 4.2 4.3 4.0  CL 97* 95* 89* 90*  CO2 33* 33* 35* 37*  GLUCOSE 124* 98 105* 108*  BUN 28* 29* 30* 35*  CREATININE 0.90 0.86 0.88 1.13*  CALCIUM 9.7 9.7 9.7 9.8  MG  --   --   --  2.1  GFR: Estimated Creatinine Clearance: 82.8 mL/min (by C-G formula based on SCr of 1.13 mg/dL (H)). Liver Function Tests: No results for input(s): AST, ALT, ALKPHOS, BILITOT, PROT, ALBUMIN in the last 168 hours. No results for input(s): LIPASE, AMYLASE in the last 168 hours. No results for input(s): AMMONIA in the last 168 hours. Coagulation Profile: No results for input(s): INR, PROTIME in the last 168 hours. Cardiac Enzymes: No results for input(s): CKTOTAL, CKMB, CKMBINDEX, TROPONINI in the last 168 hours. BNP (last 3 results) No results for input(s): PROBNP in the last 8760 hours. HbA1C: No results for input(s): HGBA1C in the last 72 hours. CBG:  Recent Labs Lab 06/25/16 2051 06/26/16 0441 06/26/16 0810 06/27/16 0353 06/27/16 0747  GLUCAP 130* 109* 126* 112* 117*   Lipid Profile: No results for input(s): CHOL, HDL, LDLCALC, TRIG, CHOLHDL, LDLDIRECT in the last 72 hours. Thyroid Function  Tests: No results for input(s): TSH, T4TOTAL, FREET4, T3FREE, THYROIDAB in the last 72 hours. Anemia Panel: No results for input(s): VITAMINB12, FOLATE, FERRITIN, TIBC, IRON, RETICCTPCT in the last 72 hours. Urine analysis:    Component Value Date/Time   COLORURINE YELLOW 05/25/2016 1410   APPEARANCEUR CLEAR 05/25/2016 1410   LABSPEC 1.012 05/25/2016 1410   PHURINE 5.0 05/25/2016 1410   GLUCOSEU NEGATIVE 05/25/2016 1410   HGBUR NEGATIVE 05/25/2016 1410   BILIRUBINUR NEGATIVE 05/25/2016 1410   KETONESUR NEGATIVE 05/25/2016 1410   PROTEINUR NEGATIVE 05/25/2016 1410   UROBILINOGEN 1.0 12/12/2013 2146   NITRITE NEGATIVE 05/25/2016 1410   LEUKOCYTESUR NEGATIVE 05/25/2016 1410   Sepsis Labs: _0 (procalcitonin:4,lacticidven:4)  ) No results found for this or any previous visit (from the past 240 hour(s)).       Radiology Studies: No results found.      Scheduled Meds: . chlorhexidine gluconate (MEDLINE KIT)  15 mL Mouth Rinse BID  . enoxaparin (LOVENOX) injection  40 mg Subcutaneous Q24H  . famotidine  20 mg Oral BID  . feeding supplement (PRO-STAT SUGAR FREE 64)  30 mL Oral BID BM  . furosemide  80 mg Per Tube TID  . hydrALAZINE  100 mg Oral Q8H  . isosorbide dinitrate  30 mg Per Tube BID  . labetalol  200 mg Per Tube BID  . mouth rinse  15 mL Mouth Rinse Q4H  . multivitamin with minerals  1 tablet Oral Daily  . potassium chloride  20 mEq Oral BID   Continuous Infusions:    LOS: 33 days    Time spent: 40 minutes    Kamile Fassler, Geraldo Docker, MD [facility] Pager [phone]   If 7PM-7AM, please contact night-coverage [email or web address] Password [email or web address] 06/27/2016, 9:07 AM

## 2016-06-27 NOTE — Progress Notes (Signed)
Dr. Sherral Hammers paged re:  BP 180/110 after hydralazine 10 mg give iv.  Will continue to monitor closely.

## 2016-06-27 NOTE — Plan of Care (Signed)
Problem: Education: Goal: Knowledge of [facility] Education information/materials will improve Outcome: Progressing Discussed with patient about new blood pressure medication regiment with some teach back displayed.

## 2016-06-28 ENCOUNTER — Inpatient Hospital Stay (HOSPITAL_COMMUNITY): Payer: PRIVATE HEALTH INSURANCE

## 2016-06-28 LAB — BASIC METABOLIC PANEL
Anion gap: 12 (ref 5–15)
BUN: 32 mg/dL — ABNORMAL HIGH (ref 6–20)
CO2: 38 mmol/L — ABNORMAL HIGH (ref 22–32)
Calcium: 9.9 mg/dL (ref 8.9–10.3)
Chloride: 90 mmol/L — ABNORMAL LOW (ref 101–111)
Creatinine, Ser: 1.19 mg/dL — ABNORMAL HIGH (ref 0.44–1.00)
GFR calc Af Amer: >60 mL/min (ref >60)
GFR calc non Af Amer: 58 mL/min — ABNORMAL LOW (ref >60)
Glucose, Bld: 108 mg/dL — ABNORMAL HIGH (ref 65–99)
Potassium: 3.9 mmol/L (ref 3.5–5.1)
Sodium: 140 mmol/L (ref 135–145)

## 2016-06-28 LAB — MAGNESIUM: Magnesium: 2.1 mg/dL (ref 1.7–2.4)

## 2016-06-28 MED ORDER — ISOSORBIDE DINITRATE 20 MG PO TABS: 40.0000 mg | ORAL_TABLET | Freq: Two times a day (BID) | ORAL | Status: DC

## 2016-06-28 MED ORDER — FUROSEMIDE 80 MG PO TABS
80.0000 mg | ORAL_TABLET | Freq: Three times a day (TID) | ORAL | Status: DC
  Administered 2016-06-28 – 2016-06-29 (×2): 80 mg via ORAL
  Filled 2016-06-28 (×3): qty 1

## 2016-06-28 MED ORDER — LABETALOL HCL 200 MG PO TABS
300.0000 mg | ORAL_TABLET | Freq: Two times a day (BID) | ORAL | Status: DC
  Administered 2016-06-28 – 2016-06-29 (×2): 300 mg via ORAL
  Filled 2016-06-28 (×3): qty 2

## 2016-06-28 MED ORDER — ACETAMINOPHEN 160 MG/5ML PO SOLN: 650.0000 mg | Freq: Four times a day (QID) | ORAL | Status: DC | PRN

## 2016-06-28 MED ORDER — ISOSORBIDE DINITRATE 30 MG PO TABS
30.0000 mg | ORAL_TABLET | Freq: Three times a day (TID) | ORAL | Status: DC
  Administered 2016-06-28 – 2016-06-29 (×2): 30 mg via ORAL
  Filled 2016-06-28 (×3): qty 1

## 2016-06-28 MED ORDER — ISOSORBIDE DINITRATE 30 MG PO TABS: 30.0000 mg | ORAL_TABLET | Freq: Three times a day (TID) | ORAL | Status: DC

## 2016-06-28 NOTE — Progress Notes (Signed)
Occupational Therapy Treatment Patient Details Name: [name] [name] MRN: [id number] DOB: Sep 18, 1980 Today's Date: 06/28/2016    History of present illness [name] with PMH of morbid obesity (469 lbs), DM, HTN and tachycardia who presented to [facility] on 10/[facility] with reports of abdominal bloating and heavy abdominal pressure.  Intubated 05/29/16 to present, trached 06/10/16.   OT comments  Pt performing sit to stand with use of momentum and assist to rise, performed x 3 to weigh and then return to bed. Continues to required total assist for pericare. Performed seated grooming with set up.  Follow Up Recommendations  CIR    Equipment Recommendations       Recommendations for Other Services      Precautions / Restrictions Precautions Precautions: Fall Precaution Comments: trach collar with PSMV Restrictions Weight Bearing Restrictions: No Other Position/Activity Restrictions: Unable to get sara stedy under Vital Go bed to attempt stand pivot       Mobility Bed Mobility Overal bed mobility: Needs Assistance Bed Mobility: Sit to Supine       Sit to supine: Mod assist   General bed mobility comments: assist for LEs up into bed  Transfers Overall transfer level: Needs assistance Equipment used: Rolling walker (2 wheeled) Transfers: Sit to/from Stand Sit to Stand: +2 physical assistance;Min assist         General transfer comment: stood from chair with momentum and assist of pad under hips to scale and walker, cues for hand placement    Balance Overall balance assessment: Needs assistance Sitting-balance support: No upper extremity supported;Feet supported Sitting balance-Leahy Scale: Good     Standing balance support: Bilateral upper extremity supported Standing balance-Leahy Scale: Poor Standing balance comment: walker and min A for support                   ADL Overall ADL's : Needs assistance/impaired     Grooming: Oral care;Set up;Bed level;Wash/dry  hands;Wash/dry Forensic scientist and Hygiene: Total assistance;Sit to/from stand Toileting - Clothing Manipulation Details (indicate cue type and reason): performed pericare       General ADL Comments: Weighed pt on standing scale.      Vision                     Perception     Praxis      Cognition   Behavior During Therapy: WFL for tasks assessed/performed Overall Cognitive Status: Within Functional Limits for tasks assessed                       Extremity/Trunk Assessment               Exercises     Shoulder Instructions       General Comments      Pertinent Vitals/ Pain       Pain Assessment: No/denies pain  Home Living                                          Prior Functioning/Environment              Frequency  Min 2X/week        Progress Toward Goals  OT Goals(current goals can now be found in  the care plan section)  Progress towards OT goals: Progressing toward goals  Acute Rehab OT Goals Patient Stated Goal: go to rehab Time For Goal Achievement: 07/07/16 Potential to Achieve Goals: Good  Plan Discharge plan remains appropriate    Co-evaluation    PT/OT/SLP Co-Evaluation/Treatment: Yes Reason for Co-Treatment: For patient/therapist safety   OT goals addressed during session: ADL's and self-care      End of Session Equipment Utilized During Treatment: Oxygen;Rolling walker   Activity Tolerance Patient tolerated treatment well   Patient Left in bed;with call bell/phone within reach   Nurse Communication          Time: 3353-3174 OT Time Calculation (min): 25 min  Charges: OT General Charges $OT Visit: 1 Procedure OT Treatments $Self Care/Home Management : 8-22 mins  Malka So 06/28/2016, 4:01 PM  [phone]

## 2016-06-28 NOTE — Progress Notes (Signed)
I met with pt at bedside while pt completing tilt feature with bed representative. I have asked Cary with P.T. To coordinate at 1 pm with bed rep to assist pt out of bed so that bed can be zeroed out with clarification of pt's actual weight which has been in question. I will contact Attending MD to clarify when pt medical workup will be complete. [phone]

## 2016-06-28 NOTE — Progress Notes (Signed)
PULMONARY / CRITICAL CARE MEDICINE   Name: [name] [name] MRN: [id number] DOB: 11-26-1980    ADMISSION DATE:  05/25/2016 CONSULTATION DATE:  05/27/16  REFERRING MD:  Dr. Ree Kida - [facility]  CHIEF COMPLAINT:  Altered Mental Status   BRIEF: [name] with PMH of morbid obesity (469 lbs), DM, HTN and tachycardia who presented to [facility] on 10/3 with acute on chronic respiratory failure with hypercarbia and hypoxemia due to diastolic CHF exacerbation and obesity hypoventilation syndrome.  Ultimately required tracheostomy due to prolonged respiratory failure. Diuresed over 100 lbs. This admission.  SUBJECTIVE:  No acute events, continues to tolerate ATC 28%  FiO2. Alert and oriented, follows commands.    VITAL SIGNS: BP (!) 138/92   Pulse 71   Temp 98.1 F (36.7 C) (Oral)   Resp 15   Ht _0  (1.6 m)   Wt 247 lb 9.2 oz (112.3 kg)   LMP  (LMP Unknown)   SpO2 95%   BMI 43.86 kg/m   HEMODYNAMICS:    VENTILATOR SETTINGS: FiO2 (%):  [28 %] 28 %  INTAKE / OUTPUT: I/O last 3 completed shifts: In: 840 [P.O.:840] Out: 2750 [Urine:2750]  PHYSICAL EXAMINATION: General: Morbidly obese. Resting comfortably in bed on ATC 28%. HENT: #6 proximal XLT trach c/d/i,  moist mucous membranes. No scleral icterus.Strong cough PULM:   Resps even and unlabored.  Clear bilaterally. CV: Regular rate. No appreciable JVD GI:  Soft. Protuberant. Active bowel sounds. Derm: Warm & dry. No rash on exposed skin. Neuro: Awake. Following commands. Grossly intact  LABS: PULMONARY  Recent Labs Lab 06/22/16 0432  PHART 7.419  PCO2ART 57.3*  PO2ART 135*  HCO3 36.3*  O2SAT 98.5     CBC No results for input(s): HGB, HCT, WBC, PLT in the last 168 hours. COAGULATION No results for input(s): INR in the last 168 hours.   CARDIAC   No results for input(s): TROPONINI in the last 168 hours. No results for input(s): PROBNP in the last 168 hours.  CHEMISTRY  Recent Labs Lab 06/22/16 0637 06/24/16 0531  06/27/16 0300 06/28/16 0630  NA 139 136 140 140  K 4.2 4.3 4.0 3.9  CL 95* 89* 90* 90*  CO2 33* 35* 37* 38*  GLUCOSE 98 105* 108* 108*  BUN 29* 30* 35* 32*  CREATININE 0.86 0.88 1.13* 1.19*  CALCIUM 9.7 9.7 9.8 9.9  MG  --   --  2.1 2.1   Estimated Creatinine Clearance: 78.8 mL/min (by C-G formula based on SCr of 1.19 mg/dL (H)).  LIVER No results for input(s): AST, ALT, ALKPHOS, BILITOT, PROT, ALBUMIN, INR in the last 168 hours.  INFECTIOUS No results for input(s): LATICACIDVEN, PROCALCITON in the last 168 hours. ENDOCRINE CBG (last 3)   Recent Labs  06/27/16 1219 06/27/16 1611 06/27/16 2152  GLUCAP 160* 141* 101*    STUDIES:  TTE 10/14 >> EF 55%, PAP 42  MICROBIOLOGY: MRSA PCR 10/5 >>  Negative Tracheal Asp Ctx 10/17 >>  Oral Flora Blood Ctx x2 10/18 >>  Negative Tracheal Asp Ctx 10/19 >>  Oral Flora  ANTIBIOTICS: Vanc 10/18 - 10/22 Zosyn 10/18 - 10/24  SIGNIFICANT EVENTS: 10/03  Admit 10/05  Transferred to ICU due to AMS, hypercarbic respiratory failure 10/06  Bipap, ntg gtt & BP remains 301'S systolic.  Net neg 5.5 L in last 24 hours w improvement in sr cr 10/07  intubated overnight .  10/07 - 10/14 diuresed aggressively 10/14  Attempting wean 10/15  Weaning 10/18  febrile 10/21  Awake, following commands, PSV  10/25  Weaning on ATC 35% 10/31  No nocturnal ventilation 11/3    Weaned to 28 % ATC/ Passed FEES  LINES: ETT 10/7 - 10/21 Trach  #6 10/21 >> R IJ TLC 10/10 >>  NGT R 10/21 >> Foley 10/7 >> PIV x1  ASSESSMENT/PLAN:  35 y.o. with PMH of super morbid obesity, HTN, pulmonary hypertension admitted with decompensated diastolic CHF.  Patient tolerating long periods of tracheostomy collar. Secretions are slowly improving. Tolerating ATC but requiring 40-50% FiO2.   Attempting to wean FiO2 and hope to start capping trials with nocturnal CPAP.Passed FEES 11/3, will start honey thick diet 11/3.  Acute on Chronic Hypoxic & Hypercarbic Respiratory  Failure - S/P tracheostomy.  - Has tolerated TC wean over weekend  Plan: -Continue ATC 24/7 and continue to wean oxygen for saturations >92% -Begin capping trails  -ABG's prn  Severe OSA/OHS. -If she were to ever be decanulated, would need to ensure NIPPV compliance. Plan: -As above.  Pulmonary Hypertension - Multifactorial.  Plan: -Continue diuresis as creatinine tolerates.  Dysphagia - in setting of vent/trach.  Continued aspiration per swallow 10/30  Suspect large bore NG may be contributing to swallow dysfunction Plan: -Continue Dys 1 Honey thick diet  -Continue to work with [place] of care as per primary service.  PCCM will follow peripherally while RT attempts to wean FiO2 down.  Please call sooner if new needs arise.   Hayden Pedro, AG-ACNP [facility]  Pgr: [phone]  PCCM Pgr: [phone]

## 2016-06-28 NOTE — Progress Notes (Signed)
Used tilt table option on bed started at 30 degrees progressed to 45 degrees and than to full standing - lasted 20 min BP taken at each level and pt tolerated this well. Placed back to semi-flowlers position and HOB up 30 degrees.

## 2016-06-28 NOTE — Progress Notes (Signed)
Physical Therapy Treatment Patient Details Name: [name] [name] MRN: [id number] DOB: 1980-09-25 Today's Date: 06/28/2016    History of Present Illness [name] with PMH of morbid obesity (469 lbs), DM, HTN and tachycardia who presented to [facility] on 10/3 with reports of abdominal bloating and heavy abdominal pressure.  Intubated 05/29/16 to present, trached 06/10/16.    PT Comments    Pt making excellent progress with mobility. Pt with limited functional activity tolerance and overall fatigue with daily activity. Per nursing pt requires more assist back to bed due to fatigue from sitting up.  Follow Up Recommendations  CIR     Equipment Recommendations  Other (comment) (TBA)    Recommendations for Other Services       Precautions / Restrictions Precautions Precautions: Fall Precaution Comments: trach collar with PSMV Restrictions Weight Bearing Restrictions: No Other Position/Activity Restrictions: Unable to get sara stedy under Vital Go bed to attempt stand pivot    Mobility  Bed Mobility               General bed mobility comments: Used Vital Go bed to come to standing  Transfers                 General transfer comment: Used Vital Go  Ambulation/Gait Ambulation/Gait assistance: +2 physical assistance;Min assist Ambulation Distance (Feet): 60 Feet Assistive device: Rolling walker (2 wheeled) (Bariatric) Gait Pattern/deviations: Step-through pattern;Decreased step length - right;Decreased step length - left;Trunk flexed;Wide base of support Gait velocity: decreased Gait velocity interpretation: Below normal speed for age/gender General Gait Details: Assist for balance and support. Follow with recliner.   Stairs            Wheelchair Mobility    Modified Rankin (Stroke Patients Only)       Balance Overall balance assessment: Needs assistance Sitting-balance support: No upper extremity supported;Feet supported Sitting balance-Leahy Scale:  Good     Standing balance support: Bilateral upper extremity supported Standing balance-Leahy Scale: Poor Standing balance comment: walker and min A for support                    Cognition Arousal/Alertness: Awake/alert Behavior During Therapy: WFL for tasks assessed/performed Overall Cognitive Status: Within Functional Limits for tasks assessed                      Exercises      General Comments        Pertinent Vitals/Pain Pain Assessment: No/denies pain    Home Living                      Prior Function            PT Goals (current goals can now be found in the care plan section) Acute Rehab PT Goals PT Goal Formulation: With patient Time For Goal Achievement: 07/06/16 Potential to Achieve Goals: Good Progress towards PT goals: Goals met and updated - see care plan;Progressing toward goals    Frequency    Min 3X/week      PT Plan Current plan remains appropriate    Co-evaluation             End of Session Equipment Utilized During Treatment: Gait belt;Oxygen Activity Tolerance: Patient tolerated treatment well Patient left: in chair;with call bell/phone within reach     Time: 3903-0092 PT Time Calculation (min) (ACUTE ONLY): 37 min  Charges:  $Gait Training: 23-37 mins  G Codes:      Addalee Kavanagh 06/28/2016, 2:13 PM [facility] PT [phone]

## 2016-06-28 NOTE — Progress Notes (Signed)
[place] TEAM 1 - Stepdown/ICU TEAM  Wanda Rideout  QMV:[id number] DOB: 1980-09-15 DOA: 05/25/2016 PCP: No PCP Per Patient    Brief Narrative:  [name] with Hx of morbid obesity (469 lbs), DM, HTN and tachycardia who presented to [facility] on 10/3 with acute on chronic respiratory failure with hypercarbia and hypoxemia due to diastolic CHF exacerbation and obesity hypoventilation syndrome.  Ultimately required tracheostomy due to prolonged respiratory failure. Diuresed over 100 lbs.   Significant Events: 10/03  Admit 10/05  Transferred to ICU due to AMS, hypercarbic respiratory failure 10/06  Bipap, ntg gtt & BP remains elevated in 284'X systolic.  Net neg 5.5 L in last 24 hours with improvement in sr cr 10/07  intubated overnight .  10/07 -10/14 diuresed aggressively 10/14  Attempting wean 10/15  Weaning 10/18  febrile 10/21  Awake, following commands, PSV   Subjective: The patient is sitting up in a chair with her PMV in place. She is alert bright and conversant. She denies chest pain shortness breath fevers chills nausea or vomiting.  Assessment & Plan:  Acute on Chronic Hypoxemic and Hypercarbic Respiratory Failure  S/p tracheostomy - ongoing trach care per PCCM - currently on TC only and tolerating well - plan is to keep trach in place until followed up in the outpt trach clinic when home CPAP can be confirmed and titrated prior to plans to decannulate   Acute diastolic heart failure I/O balance has swung back toward goal, currently net negative ~11.5L - creatinine is relatively stable - cont current diuretic dose and follow   Filed Weights   06/26/16 0500 06/27/16 0300 06/28/16 0600  Weight: 111.3 kg (245 lb 6 oz) 112 kg (246 lb 14.6 oz) 112.3 kg (247 lb 9.2 oz)    Secondary pulmonary hypertension As per CHF tx plan   HTN Not yet at goal - adjust med tx again today   Severe OSA / OHS with acute decompensation  PCCM directing care   AKI / Azotemia 2/2 diuresis Resolved  - following w/ ongiong diuresis   Recent Labs Lab 06/22/16 0637 06/24/16 0531 06/27/16 0300 06/28/16 0630  CREATININE 0.86 0.88 1.13* 1.19*    ?HCAP fevers, purulent secretions, sub-optimal CXR r/t body habitus - completed 6 days of empiric abx tx - no clinical evidence of ongoing infection - stable off abx  Elevated troponin  As per Cards "low level troponin secondary to acute heart strain/failure" - Cards does NOT intend to pursue any further procedures/evaluations   Hypernatremia  Resolved   Hypokalemia Corrected  Morbid obesity - Body mass index is 43.86 kg/m.  Altered Mental Status resolved   Depression seen by Psych 05/28/16  DVT prophylaxis: lovenox  Code Status: FULL CODE Family Communication: No family present at time of exam today Disposition Plan: medically ready for d/c to CIR  Consultants:  PCCM Cardiology   Antimicrobials:  Vanc 10/18 - 10/22 Zosyn 10/18 - 10/23  Objective: Blood pressure (!) 141/99, pulse 71, temperature 98.1 F (36.7 C), temperature source Oral, resp. rate 15, height _0  (1.6 m), weight 112.3 kg (247 lb 9.2 oz), SpO2 95 %, unknown if currently breastfeeding.  Intake/Output Summary (Last 24 hours) at 06/28/16 1453 Last data filed at 06/28/16 1004  Gross per 24 hour  Intake              440 ml  Output             1580 ml  Net            -  1140 ml   Filed Weights   06/26/16 0500 06/27/16 0300 06/28/16 0600  Weight: 111.3 kg (245 lb 6 oz) 112 kg (246 lb 14.6 oz) 112.3 kg (247 lb 9.2 oz)    Examination: General: No acute respiratory distress  Lungs: Distant breath sounds in all fields  Cardiovascular: Regular rate and rhythm with distant heart sounds Abdomen: Morbidly obese, soft, nondistended, bowel sounds positive Extremities: 1+ edema to knees bilateral lower extremities w/o signif change    CBC: No results for input(s): WBC, NEUTROABS, HGB, HCT, MCV, PLT in the last 168 hours.   Basic Metabolic Panel:  Recent  Labs Lab 06/22/16 0637 06/24/16 0531 06/27/16 0300 06/28/16 0630  NA 139 136 140 140  K 4.2 4.3 4.0 3.9  CL 95* 89* 90* 90*  CO2 33* 35* 37* 38*  GLUCOSE 98 105* 108* 108*  BUN 29* 30* 35* 32*  CREATININE 0.86 0.88 1.13* 1.19*  CALCIUM 9.7 9.7 9.8 9.9  MG  --   --  2.1 2.1   GFR: Estimated Creatinine Clearance: 78.8 mL/min (by C-G formula based on SCr of 1.19 mg/dL (H)).  HbA1C: Hgb A1c MFr Bld  Date/Time Value Ref Range Status  05/25/2016 09:48 PM 7.9 (H) 4.8 - 5.6 % Final    Comment:    (NOTE)         Pre-diabetes: 5.7 - 6.4         Diabetes: >6.4         Glycemic control for adults with diabetes: <7.0    CBG:  Recent Labs Lab 06/27/16 0353 06/27/16 0747 06/27/16 1219 06/27/16 1611 06/27/16 2152  GLUCAP 112* 117* 160* 141* 101*    Scheduled Meds: . chlorhexidine gluconate (MEDLINE KIT)  15 mL Mouth Rinse BID  . enoxaparin (LOVENOX) injection  40 mg Subcutaneous Q24H  . famotidine  20 mg Oral BID  . feeding supplement (PRO-STAT SUGAR FREE 64)  30 mL Oral BID BM  . furosemide  80 mg Per Tube TID  . hydrALAZINE  100 mg Oral Q8H  . isosorbide dinitrate  30 mg Per Tube BID  . labetalol  300 mg Per Tube BID  . mouth rinse  15 mL Mouth Rinse Q4H  . multivitamin with minerals  1 tablet Oral Daily  . potassium chloride  20 mEq Oral BID     LOS: 34 days   Cherene Altes, MD [facility] Office  [phone] Pager - Text Page per Amion as per below:  On-Call/Text Page:      [email or web address]      password [email or web address]  If 7PM-7AM, please contact night-coverage [email or web address] Password [email or web address] 06/28/2016, 2:53 PM

## 2016-06-28 NOTE — Progress Notes (Signed)
Speech Language Pathology Treatment: Dysphagia  Patient Details Name: [name] [name] MRN: [id number] DOB: July 17, 1981 Today's Date: 06/28/2016 Time: 8346-2194 SLP Time Calculation (min) (ACUTE ONLY): 16 min  Assessment / Plan / Recommendation Clinical Impression  Pt sitting in recliner, eating lunch, wearing PMV and tolerating well.  Excellent toleration of conservative diet - following precautions with occasional cues.  No overt s/s of aspiration with honey-thick liquids.  Demonstrated to pt how to place and remove valve using mirror - pt able to return demonstrate independently; explained necessity of cuff deflation.  Possible capping of trach next few days.  Pt pleased with progress.  Continue to follow for speech/swallowing.    HPI HPI: [name] with PMH of morbid obesity (469 lbs), DM, HTN and tachycardia who presented to [facility] on 10/3 with acute on chronic respiratory failure with hypercarbia and hypoxemia due to diastolic CHF exacerbation and obesity hypoventilation syndrome.  Ultimately required tracheostomy due to prolonged respiratory failure. Diuresed over 100 lbs.  ETT 10/7 - 10/21; trach xl#6 10/21      SLP Plan  Continue with current plan of care     Recommendations  Diet recommendations: Dysphagia 1 (puree);Honey-thick liquid Compensations: Slow rate;Small sips/bites;Clear throat intermittently      Patient may use Passy-Muir Speech Valve: During all waking hours (remove during sleep) PMSV Supervision: Intermittent         Oral Care Recommendations: Oral care QID Follow up Recommendations: Inpatient Rehab Plan: Continue with current plan of care       GO                [name] [name] 06/28/2016, 2:29 PM

## 2016-06-28 NOTE — Progress Notes (Signed)
Physical Therapy Treatment Patient Details Name: [name] [name] MRN: [id number] DOB: 09-25-1980 Today's Date: 06/28/2016    History of Present Illness [name] with PMH of morbid obesity (469 lbs), DM, HTN and tachycardia who presented to [facility] on 10/3 with reports of abdominal bloating and heavy abdominal pressure.  Intubated 05/29/16 to present, trached 06/10/16.    PT Comments    Pt continuing to make good progress.  Follow Up Recommendations  CIR     Equipment Recommendations  Other (comment) (to be assessed)    Recommendations for Other Services       Precautions / Restrictions Precautions Precautions: Fall Precaution Comments: trach collar with PSMV    Mobility  Bed Mobility Overal bed mobility: Needs Assistance Bed Mobility: Sit to Supine       Sit to supine: Mod assist   General bed mobility comments: assist for LEs up into bed  Transfers Overall transfer level: Needs assistance Equipment used: Rolling walker (2 wheeled) Transfers: Sit to/from [employer] Transfers Sit to Stand: +2 physical assistance;Min assist Stand pivot transfers: +2 physical assistance;Min assist       General transfer comment: On first attempt unable to stand on subsequent attempts stood from chair with momentum and assist of pad under hips to scale and walker, cues for hand placement. Performed x 3. After 3rd tiime took pivotal steps with bariatric walker from recliner to bed.   Ambulation/Gait                 Stairs            Wheelchair Mobility    Modified Rankin (Stroke Patients Only)       Balance Overall balance assessment: Needs assistance Sitting-balance support: No upper extremity supported Sitting balance-Leahy Scale: Good     Standing balance support: Bilateral upper extremity supported Standing balance-Leahy Scale: Poor Standing balance comment: UE support and supervision for static standing                    Cognition  Arousal/Alertness: Awake/alert Behavior During Therapy: WFL for tasks assessed/performed Overall Cognitive Status: Within Functional Limits for tasks assessed                      Exercises      General Comments        Pertinent Vitals/Pain Pain Assessment: No/denies pain    Home Living                      Prior Function            PT Goals (current goals can now be found in the care plan section) Acute Rehab PT Goals Patient Stated Goal: go to rehab Progress towards PT goals: Progressing toward goals    Frequency    Min 3X/week      PT Plan Current plan remains appropriate    Co-evaluation PT/OT/SLP Co-Evaluation/Treatment: Yes Reason for Co-Treatment: For patient/therapist safety PT goals addressed during session: Mobility/safety with mobility OT goals addressed during session: ADL's and self-care     End of Session Equipment Utilized During Treatment: Oxygen Activity Tolerance: Patient tolerated treatment well Patient left: in bed;with call bell/phone within reach;Other (comment) (OT present)     Time: 3149-7026 PT Time Calculation (min) (ACUTE ONLY): 20 min  Charges:  $Gait Training: 8-22 mins                    G Codes:  [name] [name] 06/28/2016, 6:39 PM [place]

## 2016-06-29 ENCOUNTER — Inpatient Hospital Stay (HOSPITAL_COMMUNITY)
Admission: RE | Admit: 2016-06-29 | Discharge: 2016-07-06 | DRG: 947 | Disposition: A | Discharge status: Home / Self Care | Payer: PRIVATE HEALTH INSURANCE | Source: Intra-hospital | Attending: Physical Medicine & Rehabilitation | Admitting: Physical Medicine & Rehabilitation

## 2016-06-29 DIAGNOSIS — Z930 Tracheostomy status: Secondary | ICD-10-CM

## 2016-06-29 DIAGNOSIS — E119 Type 2 diabetes mellitus without complications: Secondary | ICD-10-CM | POA: Diagnosis not present

## 2016-06-29 DIAGNOSIS — E118 Type 2 diabetes mellitus with unspecified complications: Secondary | ICD-10-CM | POA: Diagnosis not present

## 2016-06-29 DIAGNOSIS — E46 Unspecified protein-calorie malnutrition: Secondary | ICD-10-CM

## 2016-06-29 DIAGNOSIS — E66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: Secondary | ICD-10-CM | POA: Diagnosis not present

## 2016-06-29 DIAGNOSIS — E8809 Other disorders of plasma-protein metabolism, not elsewhere classified: Secondary | ICD-10-CM

## 2016-06-29 DIAGNOSIS — J9690 Respiratory failure, unspecified, unspecified whether with hypoxia or hypercapnia: Secondary | ICD-10-CM | POA: Diagnosis not present

## 2016-06-29 DIAGNOSIS — R1310 Dysphagia, unspecified: Secondary | ICD-10-CM

## 2016-06-29 DIAGNOSIS — I110 Hypertensive heart disease with heart failure: Secondary | ICD-10-CM

## 2016-06-29 DIAGNOSIS — Z9119 Patient's noncompliance with other medical treatment and regimen: Secondary | ICD-10-CM | POA: Diagnosis not present

## 2016-06-29 DIAGNOSIS — N179 Acute kidney failure, unspecified: Secondary | ICD-10-CM

## 2016-06-29 DIAGNOSIS — I5031 Acute diastolic (congestive) heart failure: Secondary | ICD-10-CM

## 2016-06-29 DIAGNOSIS — I5032 Chronic diastolic (congestive) heart failure: Secondary | ICD-10-CM | POA: Diagnosis not present

## 2016-06-29 DIAGNOSIS — I10 Essential (primary) hypertension: Secondary | ICD-10-CM | POA: Diagnosis not present

## 2016-06-29 DIAGNOSIS — R5381 Other malaise: Secondary | ICD-10-CM | POA: Diagnosis present

## 2016-06-29 DIAGNOSIS — Z6841 Body Mass Index (BMI) 40.0 and over, adult: Secondary | ICD-10-CM | POA: Diagnosis not present

## 2016-06-29 DIAGNOSIS — R339 Retention of urine, unspecified: Secondary | ICD-10-CM

## 2016-06-29 DIAGNOSIS — E1165 Type 2 diabetes mellitus with hyperglycemia: Secondary | ICD-10-CM | POA: Diagnosis not present

## 2016-06-29 DIAGNOSIS — I248 Other forms of acute ischemic heart disease: Secondary | ICD-10-CM

## 2016-06-29 DIAGNOSIS — M7989 Other specified soft tissue disorders: Secondary | ICD-10-CM | POA: Diagnosis not present

## 2016-06-29 LAB — CBC
HCT: 44.6 % (ref 36.0–46.0)
Hemoglobin: 13.6 g/dL (ref 12.0–15.0)
MCH: 25 pg — ABNORMAL LOW (ref 26.0–34.0)
MCHC: 30.5 g/dL (ref 30.0–36.0)
MCV: 81.8 fL (ref 78.0–100.0)
Platelets: 155 10*3/uL (ref 150–400)
RBC: 5.45 MIL/uL — ABNORMAL HIGH (ref 3.87–5.11)
RDW: 22.9 % — ABNORMAL HIGH (ref 11.5–15.5)
WBC: 6.2 10*3/uL (ref 4.0–10.5)

## 2016-06-29 LAB — BASIC METABOLIC PANEL
Anion gap: 14 (ref 5–15)
BUN: 33 mg/dL — ABNORMAL HIGH (ref 6–20)
CO2: 37 mmol/L — ABNORMAL HIGH (ref 22–32)
Calcium: 10.1 mg/dL (ref 8.9–10.3)
Chloride: 90 mmol/L — ABNORMAL LOW (ref 101–111)
Creatinine, Ser: 1.37 mg/dL — ABNORMAL HIGH (ref 0.44–1.00)
GFR calc Af Amer: 57 mL/min — ABNORMAL LOW (ref >60)
GFR calc non Af Amer: 49 mL/min — ABNORMAL LOW (ref >60)
Glucose, Bld: 105 mg/dL — ABNORMAL HIGH (ref 65–99)
Potassium: 4 mmol/L (ref 3.5–5.1)
Sodium: 141 mmol/L (ref 135–145)

## 2016-06-29 LAB — GLUCOSE, CAPILLARY
Glucose-Capillary: 108 mg/dL — ABNORMAL HIGH (ref 65–99)
Glucose-Capillary: 114 mg/dL — ABNORMAL HIGH (ref 65–99)
Glucose-Capillary: 115 mg/dL — ABNORMAL HIGH (ref 65–99)
Glucose-Capillary: 159 mg/dL — ABNORMAL HIGH (ref 65–99)

## 2016-06-29 MED ORDER — HYDRALAZINE HCL 20 MG/ML IJ SOLN: 10.0000 mg | INTRAMUSCULAR | 0 refills | Status: DC | PRN

## 2016-06-29 MED ORDER — FUROSEMIDE 80 MG PO TABS: 80.0000 mg | ORAL_TABLET | Freq: Three times a day (TID) | ORAL | 0 refills | Status: DC

## 2016-06-29 MED ORDER — LABETALOL HCL 300 MG PO TABS
300.0000 mg | ORAL_TABLET | Freq: Two times a day (BID) | ORAL | Status: DC
  Administered 2016-06-29 – 2016-07-06 (×14): 300 mg via ORAL
  Filled 2016-06-29 (×14): qty 1

## 2016-06-29 MED ORDER — POTASSIUM CHLORIDE 20 MEQ/15ML (10%) PO SOLN
20.0000 meq | Freq: Two times a day (BID) | ORAL | Status: DC
  Administered 2016-07-01: 20 meq via ORAL
  Filled 2016-06-29 (×5): qty 15

## 2016-06-29 MED ORDER — HYDRALAZINE HCL 100 MG PO TABS: 100.0000 mg | ORAL_TABLET | Freq: Three times a day (TID) | ORAL | 0 refills | Status: DC

## 2016-06-29 MED ORDER — HYDRALAZINE HCL 50 MG PO TABS
100.0000 mg | ORAL_TABLET | Freq: Three times a day (TID) | ORAL | Status: DC
  Administered 2016-06-29 – 2016-07-06 (×20): 100 mg via ORAL
  Filled 2016-06-29 (×22): qty 2

## 2016-06-29 MED ORDER — INFLUENZA VAC SPLIT QUAD 0.5 ML IM SUSY
0.5000 mL | PREFILLED_SYRINGE | INTRAMUSCULAR | Status: AC
  Administered 2016-06-30: 0.5 mL via INTRAMUSCULAR

## 2016-06-29 MED ORDER — ACETAMINOPHEN 160 MG/5ML PO SOLN
650.0000 mg | Freq: Four times a day (QID) | ORAL | Status: DC | PRN
  Filled 2016-06-29: qty 20.3

## 2016-06-29 MED ORDER — ISOSORBIDE DINITRATE 30 MG PO TABS
30.0000 mg | ORAL_TABLET | Freq: Three times a day (TID) | ORAL | Status: DC
  Administered 2016-06-29 – 2016-07-06 (×20): 30 mg via ORAL
  Filled 2016-06-29 (×22): qty 1

## 2016-06-29 MED ORDER — POTASSIUM CHLORIDE CRYS ER 20 MEQ PO TBCR: 20.0000 meq | EXTENDED_RELEASE_TABLET | Freq: Two times a day (BID) | ORAL | 0 refills | Status: DC

## 2016-06-29 MED ORDER — ISOSORBIDE DINITRATE 30 MG PO TABS: 30.0000 mg | ORAL_TABLET | Freq: Three times a day (TID) | ORAL | 0 refills | Status: DC

## 2016-06-29 MED ORDER — ENOXAPARIN SODIUM 40 MG/0.4ML ~~LOC~~ SOLN: 40.0000 mg | SUBCUTANEOUS | Status: DC

## 2016-06-29 MED ORDER — LABETALOL HCL 300 MG PO TABS: 300.0000 mg | ORAL_TABLET | Freq: Two times a day (BID) | ORAL | 0 refills | Status: DC

## 2016-06-29 MED ORDER — CHLORHEXIDINE GLUCONATE 0.12% ORAL RINSE (MEDLINE KIT): 15.0000 mL | Freq: Two times a day (BID) | OROMUCOSAL | 0 refills | Status: DC

## 2016-06-29 MED ORDER — ONDANSETRON HCL 4 MG/2ML IJ SOLN: 4.0000 mg | Freq: Four times a day (QID) | INTRAMUSCULAR | Status: DC | PRN

## 2016-06-29 MED ORDER — FAMOTIDINE 20 MG PO TABS
20.0000 mg | ORAL_TABLET | Freq: Two times a day (BID) | ORAL | Status: DC
  Administered 2016-06-29 – 2016-07-06 (×14): 20 mg via ORAL
  Filled 2016-06-29 (×14): qty 1

## 2016-06-29 MED ORDER — ENOXAPARIN SODIUM 40 MG/0.4ML ~~LOC~~ SOLN
40.0000 mg | SUBCUTANEOUS | Status: DC
  Administered 2016-06-29: 40 mg via SUBCUTANEOUS
  Filled 2016-06-29: qty 0.4

## 2016-06-29 MED ORDER — ADULT MULTIVITAMIN W/MINERALS CH: 1.0000 | ORAL_TABLET | Freq: Every day | ORAL | 0 refills | Status: DC

## 2016-06-29 MED ORDER — ADULT MULTIVITAMIN LIQUID CH
15.0000 mL | Freq: Every day | ORAL | Status: DC
  Administered 2016-06-30 – 2016-07-04 (×4): 15 mL via ORAL
  Filled 2016-06-29 (×6): qty 15

## 2016-06-29 MED ORDER — ONDANSETRON HCL 4 MG PO TABS: 4.0000 mg | ORAL_TABLET | Freq: Four times a day (QID) | ORAL | Status: DC | PRN

## 2016-06-29 MED ORDER — FENTANYL CITRATE (PF) 100 MCG/2ML IJ SOLN: 25.0000 ug | INTRAMUSCULAR | 0 refills | Status: DC | PRN

## 2016-06-29 MED ORDER — PHENOL 1.4 % MT LIQD: 1.0000 | OROMUCOSAL | 0 refills | Status: DC | PRN

## 2016-06-29 MED ORDER — ACETAMINOPHEN 160 MG/5ML PO SOLN: 650.0000 mg | Freq: Four times a day (QID) | ORAL | 0 refills | Status: DC | PRN

## 2016-06-29 MED ORDER — FUROSEMIDE 80 MG PO TABS
80.0000 mg | ORAL_TABLET | Freq: Three times a day (TID) | ORAL | Status: DC
  Administered 2016-06-29 – 2016-07-06 (×21): 80 mg via ORAL
  Filled 2016-06-29 (×21): qty 1

## 2016-06-29 MED ORDER — ENOXAPARIN SODIUM 40 MG/0.4ML ~~LOC~~ SOLN: 40.0000 mg | SUBCUTANEOUS | 0 refills | Status: DC

## 2016-06-29 MED ORDER — ADULT MULTIVITAMIN W/MINERALS CH: 1.0000 | ORAL_TABLET | Freq: Every day | ORAL | Status: DC

## 2016-06-29 MED ORDER — PRO-STAT SUGAR FREE PO LIQD
30.0000 mL | Freq: Two times a day (BID) | ORAL | Status: DC
  Administered 2016-07-03 – 2016-07-05 (×2): 30 mL via ORAL
  Filled 2016-06-29 (×6): qty 30

## 2016-06-29 MED ORDER — FAMOTIDINE 20 MG PO TABS: 20.0000 mg | ORAL_TABLET | Freq: Two times a day (BID) | ORAL | 0 refills | Status: DC

## 2016-06-29 MED ORDER — PRO-STAT SUGAR FREE PO LIQD: 30.0000 mL | Freq: Two times a day (BID) | ORAL | 0 refills | Status: DC

## 2016-06-29 MED ORDER — POTASSIUM CHLORIDE CRYS ER 20 MEQ PO TBCR
20.0000 meq | EXTENDED_RELEASE_TABLET | Freq: Two times a day (BID) | ORAL | Status: DC
  Administered 2016-06-29: 20 meq via ORAL
  Filled 2016-06-29: qty 1

## 2016-06-29 MED ORDER — DIPHENOXYLATE-ATROPINE 2.5-0.025 MG PO TABS: 1.0000 | ORAL_TABLET | Freq: Two times a day (BID) | ORAL | 0 refills | Status: DC | PRN

## 2016-06-29 MED ORDER — SORBITOL 70 % SOLN: 30.0000 mL | Freq: Every day | Status: DC | PRN

## 2016-06-29 NOTE — Progress Notes (Signed)
Cristina Gong, RN Rehab Admission Coordinator Signed Physical Medicine and Rehabilitation  PMR Pre-admission Date of Service: 06/29/2016 9:21 AM  Related encounter: ED to Hosp-Admission (Discharged) from 05/25/2016 in [facility] 4E CV SURGICAL PROGRESSIVE CARE       _0 Hide copied text PMR Admission Coordinator Pre-Admission Assessment  Patient: [name] [name] is an 35 y.o., female MRN: [id number] DOB: 1980-10-03 Height: _1  (160 cm) Weight: 86.6 kg (190 lb 14.7 oz) (on same zero'd out weight. will see about standing weight ) weight 337 lbs per standing weight after therapy session on 06/28/2016                                                                                                                                                  Insurance Information HMO:     PPO:      PCP:      IPA:      80/20:      OTHER:  PRIMARY: medcost      Policy#: [id number]      Subscriber: pt CM Name: Jackelyn Poling      Phone#: [phone]     Fax#: [phone] or verbals Pre-Cert#: [id number]   Approved for 7 days when updates are due 11/15   Employer: [employer] Benefits:  Phone #: [phone]     Name: 06/28/2016 Eff. Date: 01/22/11     Deduct: $3000      Out of Pocket Max: [phone]      Life Max: none CIR: 60%      SNF: 60% 70 Days Outpatient: 60%     Co-Pay: 30 visits for PT and OT combines, SLP with approval Home Health: 60%      Co-Pay: 40 visits per calender year DME: 60%     Co-Pay: 40% Providers: in network  SECONDARY: none       Medicaid Application Date:       Case Manager:  Disability Application Date:       Case Worker:   Emergency Contact Information        Contact Information    Name Relation Home Work Mobile   Brewington,Sheila Mother [phone]     Issabella, Rix   [phone]     Current Medical History  Patient Admitting Diagnosis: Debility due to CHF/respiratory failure  History of Present Illness:    : HPI: [name] [name] a 35 y.o.right handed femalewith history  of uncontrolled hypertension, morbid obesity 337 poundson 06/28/16 and medical noncompliance.  Presented 05/25/2016 with progressive dyspnea on exertion and orthopnea with lower extremity abdominal wall edema. Oxygen saturations in the 70s. Blood pressure elevated 215/151. Chest x-ray in the ED consistent with CHF. EKG showed sinus tachycardia but no acute ST elevations. CTA of chest negative for pulmonary emboli. Mildly elevated troponin 0.04 felt to be related to heart strain CHF. BNP elevated at 600. Placed on intravenous  Lasix and has been diuresed over 100 pounds since admission. Wound care consulted for bilateral lower extremity edema and weeping of wounds. Echocardiogram with ejection fraction of 32% normal systolic function. Required intubation for respiratory failure 05/28/2016 per critical care medicine and later tracheostomy tube required completed 06/11/2016 per Dr. Redmond Baseman.. Patient ATCthroughout the daywith capping trialsand currently with a #6 proximal XLT trach.NO CURRENT PLAN FOR DECANNULATION.Subcutaneous Lovenox for DVT prophylaxis. Diet advanced to dysphagia #1honey thick liquid. Physical and occupational therapy evaluations completed and ongoing with slow progressive gains.   Past Medical History      Past Medical History:  Diagnosis Date  . Diabetes mellitus without complication (HCC)    GDM  . Gestational diabetes mellitus in pregnancy 05/24/2013  . HTN in pregnancy, chronic 05/24/2013  . Hypertension   . Morbid obesity ([place])   . Sinus tachycardia     Family History  family history includes Diabetes in her other; Hypertension in her mother.  Prior Rehab/Hospitalizations:  Has the patient had major surgery during 100 days prior to admission? No  Current Medications   Current Facility-Administered Medications:  .  acetaminophen (TYLENOL) solution 650 mg, 650 mg, Oral, Q6H PRN, Cherene Altes, MD .  chlorhexidine gluconate (MEDLINE KIT) (PERIDEX) 0.12 %  solution 15 mL, 15 mL, Mouth Rinse, BID, Raylene Miyamoto, MD, 15 mL at 06/28/16 2000 .  diphenoxylate-atropine (LOMOTIL) 2.5-0.025 MG per tablet 1 tablet, 1 tablet, Oral, BID PRN, Allie Bossier, MD .  enoxaparin (LOVENOX) injection 40 mg, 40 mg, Subcutaneous, Q24H, Wynell Balloon, RPH, 40 mg at 06/28/16 1822 .  famotidine (PEPCID) tablet 20 mg, 20 mg, Oral, BID, Allie Bossier, MD, 20 mg at 06/29/16 1029 .  feeding supplement (PRO-STAT SUGAR FREE 64) liquid 30 mL, 30 mL, Oral, BID BM, Allie Bossier, MD, 30 mL at 06/29/16 1029 .  fentaNYL (SUBLIMAZE) injection 25-50 mcg, 25-50 mcg, Intravenous, Q1H PRN, Rush Farmer, MD, 50 mcg at 06/22/16 0452 .  furosemide (LASIX) tablet 80 mg, 80 mg, Oral, TID, Cherene Altes, MD, 80 mg at 06/29/16 1029 .  hydrALAZINE (APRESOLINE) injection 10 mg, 10 mg, Intravenous, Q4H PRN, Raylene Miyamoto, MD, 10 mg at 06/27/16 1620 .  hydrALAZINE (APRESOLINE) tablet 100 mg, 100 mg, Oral, Q8H, Cherene Altes, MD, 100 mg at 06/29/16 0527 .  isosorbide dinitrate (ISORDIL) tablet 30 mg, 30 mg, Oral, TID, Cherene Altes, MD, 30 mg at 06/29/16 1028 .  labetalol (NORMODYNE) tablet 300 mg, 300 mg, Oral, BID, Cherene Altes, MD, 300 mg at 06/29/16 1028 .  MEDLINE mouth rinse, 15 mL, Mouth Rinse, Q4H, Brandi L Ollis, NP, 15 mL at 06/28/16 2030 .  multivitamin with minerals tablet 1 tablet, 1 tablet, Oral, Daily, Allie Bossier, MD, 1 tablet at 06/29/16 1029 .  ondansetron (ZOFRAN) tablet 4 mg, 4 mg, Oral, Q6H PRN **OR** ondansetron (ZOFRAN) injection 4 mg, 4 mg, Intravenous, Q6H PRN, Lily Kocher, MD .  phenol (CHLORASEPTIC) mouth spray 1 spray, 1 spray, Mouth/Throat, PRN, Rhetta Mura Schorr, NP .  potassium chloride SA (K-DUR,KLOR-CON) CR tablet 20 mEq, 20 mEq, Oral, BID, Allie Bossier, MD, 20 mEq at 06/29/16 1028 .  sodium chloride flush (NS) 0.9 % injection 10-40 mL, 10-40 mL, Intracatheter, PRN, Raylene Miyamoto, MD .  white petrolatum (VASELINE) gel, ,  Topical, PRN, Donita Brooks, NP, 1 application at 44/01/02 1214  Patients Current Diet: DIET - DYS 1 Room service appropriate? 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 None of the above  Prior Functional Level Prior Function Level of Independence: Independent  Self Care: Did the patient need help bathing, dressing, using the toilet or eating?  Independent  Indoor Mobility: Did the patient need assistance with walking from room to room (with or without device)? Independent  Stairs: Did the patient need assistance with internal or external stairs (with or without device)? Independent  Functional Cognition: Did the patient need help planning regular tasks such as shopping or remembering to take medications? Independent  Current Functional Level Cognition Overall Cognitive Status: Within Functional Limits for tasks assessed Current Attention Level: Sustained Orientation Level: Oriented X4 Following Commands: Follows one step commands consistently Safety/Judgement: Decreased awareness of safety, Decreased awareness of deficits General Comments: Pt attending during UB exercises to keep count of repetitions. Worked on placing call with house phone.Pt oriented to month and day.      Extremity Assessment (includes Sensation/Coordination) Upper  Extremity Assessment: Generalized weakness (full PROM, noted to flex L UE at elbow)  Lower Extremity Assessment: Defer to PT evaluation RLE Deficits / Details: grossly 2+/5 LLE Deficits / Details: grossly 2+/5   ADLs Overall ADL's : Needs assistance/impaired Grooming: Oral care, Set up, Bed level, Wash/dry hands, Wash/dry face Grooming Details (indicate cue type and reason): wiped eyes with wet washcloth, swabbed mouth, NGT limits ability to wash full face Toileting- Clothing Manipulation and Hygiene: Total assistance, Sit to/from stand Toileting - Clothing Manipulation Details (indicate cue type and reason): performed pericare General ADL Comments: Weighed pt on standing scale.   Mobility Overal bed mobility: Needs Assistance Bed Mobility: Sit to Supine Rolling: Min assist Supine to sit: Min assist Sit to supine: Mod assist General bed mobility comments: assist for LEs up into bed   Transfers Overall transfer level: Needs assistance Equipment used: Rolling walker (2 wheeled) Transfers: Sit to/from Stand, Stand Pivot Transfers Sit to Stand: +2 physical assistance, Min assist Stand pivot transfers: +2 physical assistance, Min assist  Lateral/Scoot Transfers: +2 physical assistance, Max assist General transfer comment: On first attempt unable to stand on subsequent attempts stood from chair with momentum and assist of pad under hips to scale and walker, cues for hand placement. Performed x 3. After 3rd tiime took pivotal steps with bariatric walker from recliner to bed.    Ambulation / Gait / Stairs / Wheelchair Mobility Ambulation/Gait Ambulation/Gait assistance: +2 physical assistance, Min assist Ambulation Distance (Feet): 60 Feet Assistive device: Rolling walker (2 wheeled) (Bariatric) Gait Pattern/deviations: Step-through pattern, Decreased step length - right, Decreased step length - left, Trunk flexed, Wide base of support General Gait Details: Assist for balance and support. Follow  with recliner. Gait velocity: decreased Gait velocity interpretation: Below normal speed for age/gender   Posture / Balance Dynamic Sitting Balance Sitting balance - Comments: tolerated dynamic EOB sitting for therapeutic exercise Balance Overall balance assessment: Needs assistance Sitting-balance support: No upper extremity supported Sitting balance-Leahy Scale: Good Sitting balance - Comments:  tolerated dynamic EOB sitting for therapeutic exercise Postural control: Left lateral lean Standing balance support: Bilateral upper extremity supported Standing balance-Leahy Scale: Poor Standing balance comment: UE support and supervision for static standing   Special needs/care consideration BiPAP/CPAP will need OP CPAP arranged with pulmonary after d/c CPM  N/a Continuous Drip IV  N/a Dialysis  N/a Life Vest  N/a Oxygen 28 % trach collar using PMV also Special Bed may need bariatric bed Trach Size # 6 cuffed XLT. No plans to de cannulate. Pt and family will need trach teaching per pulmonology Wound Vac (area)  N/a Skin r leg abrasion, buttocks with sacral foam, groin and perineum moisture noted, r leg ecchymosis                           Bowel mgmt: LBM 11/7 continent but does have some incontinent episodes Bladder mgmt: indwelling catheter Diabetic mgmt pt states not a diabetic. Did have gestational diabetes   Previous Home Environment Living Arrangements: Parent (lives with Mom on weekends with her 29 yo child and with Aunt)  Lives With: Family Available Help at Discharge: Family, Available PRN/intermittently Type of Home: House Home Layout: One level Home Access: Level entry Bathroom Shower/Tub: Multimedia programmer: Standard Bathroom Accessibility: Yes How Accessible: Accessible via walker [place]: No Additional Comments: pt stayed with Mom in [place], [place] on weekends and with local Aunt Batbata in [place] during week  Discharge Living Setting Plans for  Discharge Living Setting: Lives with (comment) (To stay with Mom at d/c) Type of Home at Discharge: House Discharge Home Layout: One level Discharge Home Access: Level entry Discharge Bathroom Shower/Tub: Tub/shower unit, Curtain Discharge Bathroom Toilet: Handicapped height Discharge Bathroom Accessibility: Yes How Accessible: Accessible via walker Does the patient have any problems obtaining your medications?: No  Social/Family/Support Systems Patient Roles: Parent, Other (Comment) (employee) Contact Information: Mervin Hack Anticipated Caregiver: Mom and family members Anticipated Caregiver's Contact Information: see above Ability/Limitations of Caregiver: Mom works Careers adviser: Intermittent Discharge Plan Discussed with Primary Caregiver: Yes Is Caregiver In Agreement with Plan?: Yes Does Caregiver/Family have Issues with Lodging/Transportation while Pt is in Rehab?: No Plans to go home with Mom. Local sister, Elenor Legato and friends in [place] will visit, but pt to go home with Mom.  Goals/Additional Needs Patient/Family Goal for Rehab: Mod I with PT, OT, and SLP Expected length of stay: ELOS 16-22 days Equipment Needs: Bariatric DME needs. 5'3" and 337 lbs on 06/29/16 Pt/Family Agrees to Admission and willing to participate: Yes Program Orientation Provided & Reviewed with Pt/Caregiver Including Roles  & Responsibilities: Yes  Decrease burden of Care through IP rehab admission: n/a  Possible need for SNF placement upon discharge:not anticipated  Patient Condition: This patient's medical and functional status has changed since the consult dated: 06/24/2016 in which the Rehabilitation Physician determined and documented that the patient's condition is appropriate for intensive rehabilitative care in an inpatient rehabilitation facility. See "History of Present Illness" (above) for medical update. Functional changes are: mod assist. Patient's medical and functional status  update has been discussed with the Rehabilitation physician and patient remains appropriate for inpatient rehabilitation. Will admit to inpatient rehab today.  Preadmission Screen Completed By:  Cleatrice Burke, 06/29/2016 11:04 AM ______________________________________________________________________   Discussed status with Dr. Naaman Plummer 06/29/2016 at  1128 and received telephone approval for admission today.  Admission Coordinator:  Cleatrice Burke, time 9702 Date 06/29/2016  Cosigned by: Meredith Staggers, MD at 06/29/2016 12:19 PM  Revision History

## 2016-06-29 NOTE — Progress Notes (Signed)
Physical Therapy Treatment Patient Details Name: [name] [name] MRN: [id number] DOB: Jan 18, 1981 Today's Date: 06/29/2016    History of Present Illness [name] with PMH of morbid obesity (469 lbs), DM, HTN and tachycardia who presented to [facility] on 10/3 with reports of abdominal bloating and heavy abdominal pressure.  Intubated 05/29/16 to present, trached 06/10/16.    PT Comments    Patient making steady progress towards goals, tolerated multi-transfer training today and increased ambulation bouts x2. Patient eager and motivated. Continue to recommend CIR upon acute discharge.  Follow Up Recommendations  CIR     Equipment Recommendations  Other (comment) (to be assessed)    Recommendations for Other Services       Precautions / Restrictions Precautions Precautions: Fall Precaution Comments: trach collar with PSMV Restrictions Weight Bearing Restrictions: No    Mobility  Bed Mobility Overal bed mobility: Needs Assistance Bed Mobility: Sit to Supine       Sit to supine: Mod assist   General bed mobility comments: assist for LEs up into bed  Transfers Overall transfer level: Needs assistance Equipment used: Rolling walker (2 wheeled) Transfers: Sit to/from Stand Sit to Stand: +2 physical assistance;Min assist         General transfer comment: performed from [facility] and chair x2  Ambulation/Gait Ambulation/Gait assistance: Min assist;+2 safety/equipment Ambulation Distance (Feet): 80 Feet (x2 with extended seated rest) Assistive device: Rolling walker (2 wheeled) (Bariatric) Gait Pattern/deviations: Step-through pattern;Decreased stride length;Trunk flexed Gait velocity: decreased Gait velocity interpretation: Below normal speed for age/gender General Gait Details: Assist for balance and support. Follow with recliner.   Stairs            Wheelchair Mobility    Modified Rankin (Stroke Patients Only)       Balance   Sitting-balance support: No upper  extremity supported Sitting balance-Leahy Scale: Good     Standing balance support: Bilateral upper extremity supported Standing balance-Leahy Scale: Poor Standing balance comment: bilateral UE support required                    Cognition Arousal/Alertness: Awake/alert Behavior During Therapy: WFL for tasks assessed/performed Overall Cognitive Status: Within Functional Limits for tasks assessed                      Exercises      General Comments        Pertinent Vitals/Pain Pain Assessment: No/denies pain    Home Living   Living Arrangements: Parent (lives with Mom on weekends with her 50 yo child and with Aunt)             Additional Comments: pt stayed with Mom in [place], [place] on weekends and with local Aunt Batbata in [place] during week    Prior Function            PT Goals (current goals can now be found in the care plan section) Acute Rehab PT Goals Patient Stated Goal: go to rehab PT Goal Formulation: With patient Time For Goal Achievement: 07/06/16 Potential to Achieve Goals: Good Progress towards PT goals: Progressing toward goals    Frequency    Min 3X/week      PT Plan Current plan remains appropriate    Co-evaluation             End of Session Equipment Utilized During Treatment: Oxygen Activity Tolerance: Patient tolerated treatment well Patient left: in chair;with call bell/phone within reach;with family/visitor present  Time: 6294-7654 PT Time Calculation (min) (ACUTE ONLY): 25 min  Charges:  $Gait Training: 23-37 mins                    G CodesDuncan Dull 07/01/16, 1:30 PM Alben Deeds, [place] DPT  [phone]

## 2016-06-29 NOTE — H&P (Signed)
Physical Medicine and Rehabilitation Admission H&P    Chief Complaint  Patient presents with  . Abdominal Pain    "bloating"  : HPI: [name] [name] is a 35 y.o. right handed female with history of uncontrolled hypertension, morbid obesity 337 pounds and medical noncompliance. Per chart review independent prior to admission she has a 42-year-old daughter. Presented 05/25/2016 with progressive dyspnea on exertion and orthopnea with lower extremity abdominal wall edema. Oxygen saturations in the 70s. Blood pressure elevated  215/151. Chest x-ray in the ED consistent with CHF. EKG showed sinus tachycardia but no acute ST elevations. CTA of chest negative for pulmonary emboli. Mildly elevated troponin 0.04 felt to be related to heart strain CHF. BNP elevated at 600. Placed on intravenous Lasix and has been diuresed over 100 pounds since admission. Wound care consulted for bilateral lower extremity edema and weeping of wounds. Echocardiogram with ejection fraction of 33% normal systolic function. Required intubation for respiratory failure 05/28/2016 per critical care medicine and later tracheostomy tube required completed 06/11/2016 per Dr. Redmond Baseman.. Patient  ATC throughout the day with capping trials and currently with a #6 proximal XLT trach.NO CURRENT PLAN FOR DECANNULATION. Subcutaneous Lovenox for DVT prophylaxis.Initially with Nasogastric tube for nutritional support and Diet advanced to dysphagia #1 honey thick liquid and tube removed. Physical and occupational therapy evaluations completed and ongoing with slow progressive gains. M.D. has requested physical medicine rehabilitation consult.Patient was admitted for comprehensive rehabilitation program  Review of Systems  Constitutional: Negative for chills and fever.  HENT: Negative for hearing loss and tinnitus.   Eyes: Negative for blurred vision and double vision.  Respiratory: Positive for cough and shortness of breath.   Cardiovascular:  Positive for leg swelling.  Gastrointestinal: Positive for constipation. Negative for nausea and vomiting.  Genitourinary: Negative for dysuria and hematuria.  Musculoskeletal: Positive for joint pain and myalgias.  Skin: Negative for rash.  Neurological: Negative for seizures.  All other systems reviewed and are negative.  Past Medical History:  Diagnosis Date  . Diabetes mellitus without complication (HCC)    GDM  . Gestational diabetes mellitus in pregnancy 05/24/2013  . HTN in pregnancy, chronic 05/24/2013  . Hypertension   . Morbid obesity ([place])   . Sinus tachycardia    Past Surgical History:  Procedure Laterality Date  . TRACHEOSTOMY TUBE PLACEMENT N/A 06/11/2016   Procedure: TRACHEOSTOMY;  Surgeon: Melida Quitter, MD;  Location: [facility] OR;  Service: ENT;  Laterality: N/A;   Family History  Problem Relation Age of Onset  . Hypertension Mother   . Diabetes Other    Social History:  reports that she has never smoked. She has never used smokeless tobacco. She reports that she does not drink alcohol or use drugs. Allergies:  Allergies  Allergen Reactions  . No Known Allergies    Medications Prior to Admission  Medication Sig Dispense Refill  . ibuprofen (ADVIL,MOTRIN) 200 MG tablet Take 600 mg by mouth every 6 (six) hours as needed for mild pain.      Home: Home Living Family/patient expects to be discharged to:: Private residence Living Arrangements: Parent (lives with Mom on weekends with her 68 yo child and with Aunt) Available Help at Discharge: Family, Available PRN/intermittently Type of Home: House Home Access: Level entry Home Layout: One level Bathroom Shower/Tub: Multimedia programmer: Standard Bathroom Accessibility: Yes Home Equipment: None Additional Comments: pt stayed with Mom in [place], [place] on weekends and with local Aunt [place] in [place] during week  Lives With:  Family   Functional History: Prior Function Level of Independence:  Independent  Functional Status:  Mobility: Bed Mobility Overal bed mobility: Needs Assistance Bed Mobility: Sit to Supine Rolling: Min assist Supine to sit: Min assist Sit to supine: Mod assist General bed mobility comments: assist for LEs up into bed Transfers Overall transfer level: Needs assistance Equipment used: Rolling walker (2 wheeled) Transfers: Sit to/from Stand Sit to Stand: +2 physical assistance, Min assist Stand pivot transfers: +2 physical assistance, Min assist  Lateral/Scoot Transfers: +2 physical assistance, Max assist General transfer comment: performed from [facility] and chair x2 Ambulation/Gait Ambulation/Gait assistance: Min assist, +2 safety/equipment Ambulation Distance (Feet): 80 Feet (x2 with extended seated rest) Assistive device: Rolling walker (2 wheeled) (Bariatric) Gait Pattern/deviations: Step-through pattern, Decreased stride length, Trunk flexed General Gait Details: Assist for balance and support. Follow with recliner. Gait velocity: decreased Gait velocity interpretation: Below normal speed for age/gender    ADL: ADL Overall ADL's : Needs assistance/impaired Grooming: Oral care, Set up, Bed level, Wash/dry hands, Wash/dry face Grooming Details (indicate cue type and reason): wiped eyes with wet washcloth, swabbed mouth, NGT limits ability to wash full face Toileting- Clothing Manipulation and Hygiene: Total assistance, Sit to/from stand Toileting - Clothing Manipulation Details (indicate cue type and reason): performed pericare General ADL Comments: Weighed pt on standing scale.  Cognition: Cognition Overall Cognitive Status: Within Functional Limits for tasks assessed Orientation Level: Oriented X4 Cognition Arousal/Alertness: Awake/alert Behavior During Therapy: WFL for tasks assessed/performed Overall Cognitive Status: Within Functional Limits for tasks assessed Area of Impairment: Attention Orientation Level: Disoriented to, Time,  Situation Current Attention Level: Sustained Memory: Decreased recall of precautions Following Commands: Follows one step commands consistently Safety/Judgement: Decreased awareness of safety, Decreased awareness of deficits Problem Solving: Requires verbal cues, Requires tactile cues, Difficulty sequencing General Comments: Pt attending during UB exercises to keep count of repetitions. Worked on placing call with house phone.Pt oriented to month and day.    Physical Exam: Blood pressure (!) 142/89, pulse (!) 102, temperature 97.9 F (36.6 C), temperature source Oral, resp. rate (!) 21, height _0  (1.6 m), weight 86.6 kg (190 lb 14.7 oz), SpO2 95 %, unknown if currently breastfeeding. Physical Exam  Constitutional: She is oriented to person, place, and time.  35 year old right-handed morbid obese female  HENT:  Head: Normocephalic.  Eyes: EOM are normal.  Neck:  #6 proximal XLT trach in place  Cardiovascular: Normal rate and regular rhythm.   Respiratory:  Limited inspiratory effort but clear to auscultation  GI: Soft. Bowel sounds are normal. She exhibits no distension. There is no tenderness.  Neurological: She is alert and oriented to person, place, and time.  Voice remains hoarse but intelligible. Swallow weak/sl delayed. Remainder of CN exam unremarkable. Follow simple commands. Oriented to person place and time. UE motor 4+/5 prox to distal. LE: 3/5 HF, 3/5 KE and 4/5 ADF/PF. No gross sensory findings. Cognition appropriate.   Skin:  Ischemic vascular changes lower extremities  Psychiatric: She has a normal mood and affect. Her behavior is normal. Judgment and thought content normal.  Musculoskeletal:  4/5 bilateral UE. LE: 3/5 HF, KE and 4/5 ADF/PF. Senses pain and LT in all 4's   Results for orders placed or performed during the hospital encounter of 05/25/16 (from the past 48 hour(s))  Glucose, capillary     Status: Abnormal   Collection Time: 06/27/16  4:11 PM  Result  Value Ref Range   Glucose-Capillary 141 (H) 65 - 99 mg/dL  Glucose, capillary     Status: Abnormal   Collection Time: 06/27/16  9:52 PM  Result Value Ref Range   Glucose-Capillary 101 (H) 65 - 99 mg/dL   Comment 1 Notify RN   Basic metabolic panel     Status: Abnormal   Collection Time: 06/28/16  6:30 AM  Result Value Ref Range   Sodium 140 135 - 145 mmol/L   Potassium 3.9 3.5 - 5.1 mmol/L   Chloride 90 (L) 101 - 111 mmol/L   CO2 38 (H) 22 - 32 mmol/L   Glucose, Bld 108 (H) 65 - 99 mg/dL   BUN 32 (H) 6 - 20 mg/dL   Creatinine, Ser 1.19 (H) 0.44 - 1.00 mg/dL   Calcium 9.9 8.9 - 10.3 mg/dL   GFR calc non Af Amer 58 (L) >60 mL/min   GFR calc Af Amer >60 >60 mL/min    Comment: (NOTE) The eGFR has been calculated using the CKD EPI equation. This calculation has not been validated in all clinical situations. eGFR's persistently <60 mL/min signify possible Chronic Kidney Disease.    Anion gap 12 5 - 15  Magnesium     Status: None   Collection Time: 06/28/16  6:30 AM  Result Value Ref Range   Magnesium 2.1 1.7 - 2.4 mg/dL  Glucose, capillary     Status: Abnormal   Collection Time: 06/29/16 12:21 AM  Result Value Ref Range   Glucose-Capillary 114 (H) 65 - 99 mg/dL  Basic metabolic panel     Status: Abnormal   Collection Time: 06/29/16  3:00 AM  Result Value Ref Range   Sodium 141 135 - 145 mmol/L   Potassium 4.0 3.5 - 5.1 mmol/L   Chloride 90 (L) 101 - 111 mmol/L   CO2 37 (H) 22 - 32 mmol/L   Glucose, Bld 105 (H) 65 - 99 mg/dL   BUN 33 (H) 6 - 20 mg/dL   Creatinine, Ser 1.37 (H) 0.44 - 1.00 mg/dL   Calcium 10.1 8.9 - 10.3 mg/dL   GFR calc non Af Amer 49 (L) >60 mL/min   GFR calc Af Amer 57 (L) >60 mL/min    Comment: (NOTE) The eGFR has been calculated using the CKD EPI equation. This calculation has not been validated in all clinical situations. eGFR's persistently <60 mL/min signify possible Chronic Kidney Disease.    Anion gap 14 5 - 15  CBC     Status: Abnormal    Collection Time: 06/29/16  3:00 AM  Result Value Ref Range   WBC 6.2 4.0 - 10.5 K/uL   RBC 5.45 (H) 3.87 - 5.11 MIL/uL   Hemoglobin 13.6 12.0 - 15.0 g/dL   HCT 44.6 36.0 - 46.0 %   MCV 81.8 78.0 - 100.0 fL   MCH 25.0 (L) 26.0 - 34.0 pg   MCHC 30.5 30.0 - 36.0 g/dL   RDW 22.9 (H) 11.5 - 15.5 %   Platelets 155 150 - 400 K/uL  Glucose, capillary     Status: Abnormal   Collection Time: 06/29/16  3:45 AM  Result Value Ref Range   Glucose-Capillary 108 (H) 65 - 99 mg/dL  Glucose, capillary     Status: Abnormal   Collection Time: 06/29/16  8:18 AM  Result Value Ref Range   Glucose-Capillary 115 (H) 65 - 99 mg/dL  Glucose, capillary     Status: Abnormal   Collection Time: 06/29/16 11:33 AM  Result Value Ref Range   Glucose-Capillary 159 (H) 65 - 99 mg/dL     Dg Chest Port 1 View  Result Date: 06/28/2016 CLINICAL DATA:  Respiratory failure EXAM: PORTABLE CHEST 1 VIEW COMPARISON:  06/16/2016 FINDINGS: Tracheostomy remain in position. Right jugular central venous catheter tip in the SVC unchanged. Gastric tube is been removed. Improvement in bibasilar airspace disease which may be due to atelectasis or pneumonia. No effusion. IMPRESSION: Bibasilar airspace disease with interval improvement. Electronically Signed   By: Franchot Gallo M.D.   On: 06/28/2016 06:58       Medical Problem List and Plan: 1.  Debilitation secondary to CHF/respiratory failure  -admit to inpatient rehab today 2.  DVT Prophylaxis/Anticoagulation: Subcutaneous Lovenox. Monitor platelet counts and any signs of bleeding. Check vascular studies on admit 3. Pain Management: Tylenol as needed 4. 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 No current plans for decannulation. PMV for speech 5. Neuropsych: This patient is capable of making decisions on her own behalf. 6. Skin/Wound Care: Routine skin checks 7. Fluids/Electrolytes/Nutrition: Routine I&O with follow-up chemistries 8. Dysphagia. Dysphagia #1  honey thick liquids. Follow speech therapy. Monitor hydration closely. Attempt to keep patient on the drier side  -pt doesn't like diet but reviewed the importance of pushing her PO intake 9. Morbid obesity/hypoventilation syndrome.BMI 50.75. Plans to keep tracheostomy in place until she has a working CPAP machine in her possession as an outpatient 10. Hypertension/diastolic congestive heart failure:. Lasix 80 mg 3 times a day, Isordil 30 mg 3 times a day, labetalol 300 mg twice a day. Monitor with increased mobility  -keep "dry"  -follow daily weights   Post Admission Physician Evaluation: 1. Functional deficits secondary  to debility related to CHF and respiratory failure. 2. Patient is admitted to receive collaborative, interdisciplinary care between the physiatrist, rehab nursing staff, and therapy team. 3. Patient's level of medical complexity and substantial therapy needs in context of that medical necessity cannot be provided at a lesser intensity of care such as a SNF. 4. Patient has experienced substantial functional loss from his/her baseline which was documented above under the "Functional History" and "Functional Status" headings.  Judging by the patient's diagnosis, physical exam, and functional history, the patient has potential for functional progress which will result in measurable gains while on inpatient rehab.  These gains will be of substantial and practical use upon discharge  in facilitating mobility and self-care at the household level. 5. Physiatrist will provide 24 hour management of medical needs as well as oversight of the therapy plan/treatment and provide guidance as appropriate regarding the interaction of the two. 6. 24 hour rehab nursing will assist with bladder management, bowel management, safety, skin/wound care, disease management, medication administration and patient education  and help integrate therapy concepts, techniques,education, etc. 7. PT will assess and  treat for/with: Lower extremity strength, range of motion, stamina, balance, functional mobility, safety, adaptive techniques and equipment, activity tolerance, breathing techniques, NMR, family ed.   Goals are: mod I. 8. OT will assess and treat for/with: ADL's, functional mobility, safety, upper extremity strength, adaptive techniques and equipment, NMR, family ed, community reintegration.   Goals are: mod I to supervision. Therapy may proceed with showering this patient. 9. SLP will assess and treat for/with: speech, swallowing, communication.  Goals are: mod I. 10. Case Management and Social Worker will assess and treat for psychological issues and discharge planning. 11. Team conference will be held weekly to assess progress toward goals and to determine barriers to discharge. 12. Patient will receive at least 3 hours of therapy per day at  least 5 days per week. 13. ELOS: 9-12 days       14. Prognosis:  excellent     Meredith Staggers, MD, [facility] 06/29/2016  06/29/2016

## 2016-06-29 NOTE — PMR Pre-admission (Signed)
PMR Admission Coordinator Pre-Admission Assessment  Patient: [name] [name] is an 35 y.o., female MRN: [id number] DOB: 09-Oct-1980 Height: 5' 3" (160 cm) Weight: 86.6 kg (190 lb 14.7 oz) (on same zero'd out weight. will see about standing weight ) weight 337 lbs per standing weight after therapy session on 06/28/2016              Insurance Information HMO:     PPO:      PCP:      IPA:      80/20:      OTHER:  PRIMARY: [employer]      Policy#: [id number]      Subscriber: pt CM Name: Jackelyn Poling      Phone#: [phone]     Fax#: [phone] or verbals Pre-Cert#: [id number]   Approved for 7 days when updates are due 11/15   Employer: [employer] Benefits:  Phone #: [phone]     Name: 06/28/2016 Eff. Date: 01/22/11     Deduct: $3000      Out of Pocket Max: [phone]      Life Max: none CIR: 60%      SNF: 60% 70 Days Outpatient: 60%     Co-Pay: 30 visits for PT and OT combines, SLP with approval Home Health: 60%      Co-Pay: 40 visits per calender year DME: 60%     Co-Pay: 40% Providers: in network  SECONDARY: none       Medicaid Application Date:       Case Manager:  Disability Application Date:       Case Worker:   Emergency Contact Information Contact Information    Name Relation Home Work Mobile   Brewington,Sheila Mother [phone]     [place], [place]   [phone]     Current Medical History  Patient Admitting Diagnosis: Debility due to CHF/respiratory failure  History of Present Illness:    : HPI: [name] [name] a 35 y.o.right handed femalewith history of uncontrolled hypertension, morbid obesity 337 pounds on 06/28/16 and medical noncompliance.  Presented 05/25/2016 with progressive dyspnea on exertion and orthopnea with lower extremity abdominal wall edema. Oxygen saturations in the 70s. Blood pressure elevated 215/151. Chest x-ray in the ED consistent with CHF. EKG showed sinus tachycardia but no acute ST elevations. CTA of chest negative for pulmonary emboli. Mildly elevated  troponin 0.04 felt to be related to heart strain CHF. BNP elevated at 600. Placed on intravenous Lasix and has been diuresed over 100 pounds since admission. Wound care consulted for bilateral lower extremity edema and weeping of wounds. Echocardiogram with ejection fraction of 37% normal systolic function. Required intubation for respiratory failure 05/28/2016 per critical care medicine and later tracheostomy tube required completed 06/11/2016 per Dr. Redmond Baseman.. Patient  ATCthroughout the day with capping trials and currently with a #6 proximal XLT trach.NO CURRENT PLAN FOR DECANNULATION. Subcutaneous Lovenox for DVT prophylaxis. Diet advanced to dysphagia #1 honey thick liquid. Physical and occupational therapy evaluations completed and ongoing with slow progressive gains.   Past Medical History  Past Medical History:  Diagnosis Date  . Diabetes mellitus without complication (HCC)    GDM  . Gestational diabetes mellitus in pregnancy 05/24/2013  . HTN in pregnancy, chronic 05/24/2013  . Hypertension   . Morbid obesity ([place])   . Sinus tachycardia     Family History  family history includes Diabetes in her other; Hypertension in her mother.  Prior Rehab/Hospitalizations:  Has the patient had major surgery during 100 days prior to admission? No  Current Medications   Current Facility-Administered Medications:  .  acetaminophen (TYLENOL) solution 650 mg, 650 mg, Oral, Q6H PRN, Cherene Altes, MD .  chlorhexidine gluconate (MEDLINE KIT) (PERIDEX) 0.12 % solution 15 mL, 15 mL, Mouth Rinse, BID, Raylene Miyamoto, MD, 15 mL at 06/28/16 2000 .  diphenoxylate-atropine (LOMOTIL) 2.5-0.025 MG per tablet 1 tablet, 1 tablet, Oral, BID PRN, Allie Bossier, MD .  enoxaparin (LOVENOX) injection 40 mg, 40 mg, Subcutaneous, Q24H, Wynell Balloon, RPH, 40 mg at 06/28/16 1822 .  famotidine (PEPCID) tablet 20 mg, 20 mg, Oral, BID, Allie Bossier, MD, 20 mg at 06/29/16 1029 .  feeding supplement (PRO-STAT  SUGAR FREE 64) liquid 30 mL, 30 mL, Oral, BID BM, Allie Bossier, MD, 30 mL at 06/29/16 1029 .  fentaNYL (SUBLIMAZE) injection 25-50 mcg, 25-50 mcg, Intravenous, Q1H PRN, Rush Farmer, MD, 50 mcg at 06/22/16 0452 .  furosemide (LASIX) tablet 80 mg, 80 mg, Oral, TID, Cherene Altes, MD, 80 mg at 06/29/16 1029 .  hydrALAZINE (APRESOLINE) injection 10 mg, 10 mg, Intravenous, Q4H PRN, Raylene Miyamoto, MD, 10 mg at 06/27/16 1620 .  hydrALAZINE (APRESOLINE) tablet 100 mg, 100 mg, Oral, Q8H, Cherene Altes, MD, 100 mg at 06/29/16 0527 .  isosorbide dinitrate (ISORDIL) tablet 30 mg, 30 mg, Oral, TID, Cherene Altes, MD, 30 mg at 06/29/16 1028 .  labetalol (NORMODYNE) tablet 300 mg, 300 mg, Oral, BID, Cherene Altes, MD, 300 mg at 06/29/16 1028 .  MEDLINE mouth rinse, 15 mL, Mouth Rinse, Q4H, Brandi L Ollis, NP, 15 mL at 06/28/16 2030 .  multivitamin with minerals tablet 1 tablet, 1 tablet, Oral, Daily, Allie Bossier, MD, 1 tablet at 06/29/16 1029 .  ondansetron (ZOFRAN) tablet 4 mg, 4 mg, Oral, Q6H PRN **OR** ondansetron (ZOFRAN) injection 4 mg, 4 mg, Intravenous, Q6H PRN, Lily Kocher, MD .  phenol (CHLORASEPTIC) mouth spray 1 spray, 1 spray, Mouth/Throat, PRN, Rhetta Mura Schorr, NP .  potassium chloride SA (K-DUR,KLOR-CON) CR tablet 20 mEq, 20 mEq, Oral, BID, Allie Bossier, MD, 20 mEq at 06/29/16 1028 .  sodium chloride flush (NS) 0.9 % injection 10-40 mL, 10-40 mL, Intracatheter, PRN, Raylene Miyamoto, MD .  white petrolatum (VASELINE) gel, , Topical, PRN, Donita Brooks, NP, 1 application at 66/29/47 1214  Patients Current Diet: DIET - DYS 1 Room service appropriate? 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 None of the above  Prior Functional Level Prior Function Level of Independence: Independent  Self Care: Did the patient need help bathing, dressing, using the toilet or eating?  Independent  Indoor Mobility: Did the patient need assistance with walking from room to room (with or without device)? Independent  Stairs: Did the patient need assistance with internal or external stairs (with or without device)? Independent  Functional Cognition: Did the patient need help planning regular tasks such as shopping or remembering to take medications?  Independent  Current Functional Level Cognition  Overall Cognitive Status: Within Functional Limits for tasks assessed Current Attention Level: Sustained Orientation Level: Oriented X4 Following Commands: Follows one step commands consistently Safety/Judgement: Decreased awareness of safety, Decreased awareness of deficits General Comments: Pt attending during UB exercises to keep count of repetitions. Worked on placing call with house phone.Pt oriented to month and day.      Extremity Assessment (includes Sensation/Coordination)  Upper Extremity Assessment: Generalized weakness (full PROM, noted to flex L UE at elbow)  Lower Extremity Assessment: Defer to PT evaluation RLE Deficits / Details: grossly 2+/5 LLE Deficits / Details: grossly 2+/5    ADLs  Overall ADL's : Needs assistance/impaired Grooming: Oral care, Set up, Bed level, Wash/dry hands, Wash/dry face Grooming Details (indicate cue type and reason): wiped eyes with wet washcloth, swabbed mouth, NGT limits ability to wash full  face Toileting- Clothing Manipulation and Hygiene: Total assistance, Sit to/from stand Toileting - Clothing Manipulation Details (indicate cue type and reason): performed pericare General ADL Comments: Weighed pt on standing scale.    Mobility  Overal bed mobility: Needs Assistance Bed Mobility: Sit to Supine Rolling: Min assist Supine to sit: Min assist Sit to supine: Mod assist General bed mobility comments: assist for LEs up into bed    Transfers  Overall transfer level: Needs assistance Equipment used: Rolling walker (2 wheeled) Transfers: Sit to/from Stand, Stand Pivot Transfers Sit to Stand: +2 physical assistance, Min assist Stand pivot transfers: +2 physical assistance, Min assist  Lateral/Scoot Transfers: +2 physical assistance, Max assist General transfer comment: On first attempt unable to stand on subsequent attempts stood from chair with momentum and assist of pad under hips to scale and walker, cues for hand placement. Performed x 3. After 3rd tiime took pivotal steps with bariatric walker from recliner to bed.     Ambulation / Gait / Stairs / Wheelchair Mobility  Ambulation/Gait Ambulation/Gait assistance: +2 physical assistance, Min assist Ambulation Distance (Feet): 60 Feet Assistive device: Rolling walker (2 wheeled) (Bariatric) Gait Pattern/deviations: Step-through pattern, Decreased step length - right, Decreased step length - left, Trunk flexed, Wide base of support General Gait Details: Assist for balance and support. Follow with recliner. Gait velocity: decreased Gait velocity interpretation: Below normal speed for age/gender    Posture / Balance Dynamic Sitting Balance Sitting balance - Comments: tolerated dynamic EOB sitting for therapeutic exercise Balance Overall balance assessment: Needs assistance Sitting-balance support: No upper extremity supported Sitting balance-Leahy Scale: Good Sitting balance - Comments: tolerated dynamic EOB sitting for  therapeutic exercise Postural control: Left lateral lean Standing balance support: Bilateral upper extremity supported Standing balance-Leahy Scale: Poor Standing balance comment: UE support and supervision for static standing    Special needs/care consideration BiPAP/CPAP will need OP CPAP arranged with pulmonary after d/c CPM  N/a Continuous Drip IV  N/a Dialysis  N/a Life Vest  N/a Oxygen 28 % trach collar using PMV also Special Bed may need bariatric bed Trach Size # 6 cuffed XLT. No plans to de cannulate. Pt and family will need trach teaching per pulmonology Wound Vac (area)  N/a Skin r leg abrasion, buttocks with sacral foam, groin and perineum moisture noted, r leg ecchymosis                           Bowel mgmt: LBM 11/7 continent but does have some incontinent episodes Bladder mgmt: indwelling catheter Diabetic mgmt pt states not a diabetic. Did have gestational diabetes  Previous Home Environment Living Arrangements: Parent (lives with Mom on weekends with her 10 yo child and with Aunt)  Lives With: Family Available Help at Discharge: Family, Available PRN/intermittently Type of Home: House Home Layout: One level Home Access: Level entry Bathroom Shower/Tub: Multimedia programmer: Standard Bathroom Accessibility: Yes How Accessible: Accessible via walker [place]: No Additional Comments: pt stayed with Mom in [place], [place] on weekends and with local Aunt Batbata in [place] during week  Discharge Living Setting Plans for Discharge Living Setting: Lives with (comment) (To stay with Mom at d/c) Type of Home at Discharge: House Discharge Home Layout: One level Discharge Home Access: Level entry Discharge Bathroom Shower/Tub: Tub/shower unit, Curtain Discharge Bathroom Toilet: Handicapped height Discharge Bathroom Accessibility: Yes How Accessible: Accessible via walker Does the patient have any problems obtaining your medications?:  No  Social/Family/Support Systems Patient Roles: Parent, Other (Comment) (employee) Contact Information: Mervin Hack Anticipated Caregiver: Mom and family members Anticipated Caregiver's Contact Information: see above Ability/Limitations of Caregiver: Mom works Careers adviser: Intermittent Discharge Plan Discussed with Primary Caregiver: Yes Is Caregiver In Agreement with Plan?: Yes Does Caregiver/Family have Issues with Lodging/Transportation while Pt is in Rehab?: No Plans to go home with Mom. Local sister, Elenor Legato and friends in [place] will visit, but pt to go home with Mom.  Goals/Additional Needs Patient/Family Goal for Rehab: Mod I with PT, OT, and SLP Expected length of stay: [facility] 16-22 days Equipment Needs: Bariatric DME needs. 5'3" and 337 lbs on 06/29/16 Pt/Family Agrees to Admission and willing to participate: Yes Program Orientation Provided & Reviewed with Pt/Caregiver Including Roles  & Responsibilities: Yes  Decrease burden of Care through IP rehab admission: n/a  Possible need for SNF placement upon discharge:not anticipated  Patient Condition: This patient's medical and functional status has changed since the consult dated: 06/24/2016 in which the Rehabilitation Physician determined and documented that the patient's condition is appropriate for intensive rehabilitative care in an inpatient rehabilitation facility. See "History of Present Illness" (above) for medical update. Functional changes are: mod assist. Patient's medical and functional status update has been discussed with the Rehabilitation physician and patient remains appropriate for inpatient rehabilitation. Will admit to inpatient rehab today.  Preadmission Screen Completed By:  Cleatrice Burke, 06/29/2016 11:04 AM ______________________________________________________________________   Discussed status with Dr. Naaman Plummer 06/29/2016 at  1128 and received telephone approval for admission today.  Admission  Coordinator:  Cleatrice Burke, time 0865 Date 06/29/2016

## 2016-06-29 NOTE — H&P (View-Only) (Signed)
Physical Medicine and Rehabilitation Admission H&P    Chief Complaint  Patient presents with  . Abdominal Pain    "bloating"  : HPI: [name] [name] is a 35 y.o. right handed female with history of uncontrolled hypertension, morbid obesity 337 pounds and medical noncompliance. Per chart review independent prior to admission she has a 40-year-old daughter. Presented 05/25/2016 with progressive dyspnea on exertion and orthopnea with lower extremity abdominal wall edema. Oxygen saturations in the 70s. Blood pressure elevated  215/151. Chest x-ray in the ED consistent with CHF. EKG showed sinus tachycardia but no acute ST elevations. CTA of chest negative for pulmonary emboli. Mildly elevated troponin 0.04 felt to be related to heart strain CHF. BNP elevated at 600. Placed on intravenous Lasix and has been diuresed over 100 pounds since admission. Wound care consulted for bilateral lower extremity edema and weeping of wounds. Echocardiogram with ejection fraction of 17% normal systolic function. Required intubation for respiratory failure 05/28/2016 per critical care medicine and later tracheostomy tube required completed 06/11/2016 per Dr. Redmond Baseman.. Patient  ATC throughout the day with capping trials and currently with a #6 proximal XLT trach.NO CURRENT PLAN FOR DECANNULATION. Subcutaneous Lovenox for DVT prophylaxis.Initially with Nasogastric tube for nutritional support and Diet advanced to dysphagia #1 honey thick liquid and tube removed. Physical and occupational therapy evaluations completed and ongoing with slow progressive gains. M.D. has requested physical medicine rehabilitation consult.Patient was admitted for comprehensive rehabilitation program  Review of Systems  Constitutional: Negative for chills and fever.  HENT: Negative for hearing loss and tinnitus.   Eyes: Negative for blurred vision and double vision.  Respiratory: Positive for cough and shortness of breath.   Cardiovascular:  Positive for leg swelling.  Gastrointestinal: Positive for constipation. Negative for nausea and vomiting.  Genitourinary: Negative for dysuria and hematuria.  Musculoskeletal: Positive for joint pain and myalgias.  Skin: Negative for rash.  Neurological: Negative for seizures.  All other systems reviewed and are negative.  Past Medical History:  Diagnosis Date  . Diabetes mellitus without complication (HCC)    GDM  . Gestational diabetes mellitus in pregnancy 05/24/2013  . HTN in pregnancy, chronic 05/24/2013  . Hypertension   . Morbid obesity ([place])   . Sinus tachycardia    Past Surgical History:  Procedure Laterality Date  . TRACHEOSTOMY TUBE PLACEMENT N/A 06/11/2016   Procedure: TRACHEOSTOMY;  Surgeon: Melida Quitter, MD;  Location: [facility] OR;  Service: ENT;  Laterality: N/A;   Family History  Problem Relation Age of Onset  . Hypertension Mother   . Diabetes Other    Social History:  reports that she has never smoked. She has never used smokeless tobacco. She reports that she does not drink alcohol or use drugs. Allergies:  Allergies  Allergen Reactions  . No Known Allergies    Medications Prior to Admission  Medication Sig Dispense Refill  . ibuprofen (ADVIL,MOTRIN) 200 MG tablet Take 600 mg by mouth every 6 (six) hours as needed for mild pain.      Home: Home Living Family/patient expects to be discharged to:: Private residence Living Arrangements: Parent (lives with Mom on weekends with her 75 yo child and with Aunt) Available Help at Discharge: Family, Available PRN/intermittently Type of Home: House Home Access: Level entry Home Layout: One level Bathroom Shower/Tub: Multimedia programmer: Standard Bathroom Accessibility: Yes Home Equipment: None Additional Comments: pt stayed with Mom in [place], [place] on weekends and with local Aunt Batb[place] in [place] during week  Lives With:  Family   Functional History: Prior Function Level of Independence:  Independent  Functional Status:  Mobility: Bed Mobility Overal bed mobility: Needs Assistance Bed Mobility: Sit to Supine Rolling: Min assist Supine to sit: Min assist Sit to supine: Mod assist General bed mobility comments: assist for LEs up into bed Transfers Overall transfer level: Needs assistance Equipment used: Rolling walker (2 wheeled) Transfers: Sit to/from Stand Sit to Stand: +2 physical assistance, Min assist Stand pivot transfers: +2 physical assistance, Min assist  Lateral/Scoot Transfers: +2 physical assistance, Max assist General transfer comment: performed from [facility] and chair x2 Ambulation/Gait Ambulation/Gait assistance: Min assist, +2 safety/equipment Ambulation Distance (Feet): 80 Feet (x2 with extended seated rest) Assistive device: Rolling walker (2 wheeled) (Bariatric) Gait Pattern/deviations: Step-through pattern, Decreased stride length, Trunk flexed General Gait Details: Assist for balance and support. Follow with recliner. Gait velocity: decreased Gait velocity interpretation: Below normal speed for age/gender    ADL: ADL Overall ADL's : Needs assistance/impaired Grooming: Oral care, Set up, Bed level, Wash/dry hands, Wash/dry face Grooming Details (indicate cue type and reason): wiped eyes with wet washcloth, swabbed mouth, NGT limits ability to wash full face Toileting- Clothing Manipulation and Hygiene: Total assistance, Sit to/from stand Toileting - Clothing Manipulation Details (indicate cue type and reason): performed pericare General ADL Comments: Weighed pt on standing scale.  Cognition: Cognition Overall Cognitive Status: Within Functional Limits for tasks assessed Orientation Level: Oriented X4 Cognition Arousal/Alertness: Awake/alert Behavior During Therapy: WFL for tasks assessed/performed Overall Cognitive Status: Within Functional Limits for tasks assessed Area of Impairment: Attention Orientation Level: Disoriented to, Time,  Situation Current Attention Level: Sustained Memory: Decreased recall of precautions Following Commands: Follows one step commands consistently Safety/Judgement: Decreased awareness of safety, Decreased awareness of deficits Problem Solving: Requires verbal cues, Requires tactile cues, Difficulty sequencing General Comments: Pt attending during UB exercises to keep count of repetitions. Worked on placing call with house phone.Pt oriented to month and day.    Physical Exam: Blood pressure (!) 142/89, pulse (!) 102, temperature 97.9 F (36.6 C), temperature source Oral, resp. rate (!) 21, height _0  (1.6 m), weight 86.6 kg (190 lb 14.7 oz), SpO2 95 %, unknown if currently breastfeeding. Physical Exam  Constitutional: She is oriented to person, place, and time.  35 year old right-handed morbid obese female  HENT:  Head: Normocephalic.  Eyes: EOM are normal.  Neck:  #6 proximal XLT trach in place  Cardiovascular: Normal rate and regular rhythm.   Respiratory:  Limited inspiratory effort but clear to auscultation  GI: Soft. Bowel sounds are normal. She exhibits no distension. There is no tenderness.  Neurological: She is alert and oriented to person, place, and time.  Voice remains hoarse but intelligible. Swallow weak/sl delayed. Remainder of CN exam unremarkable. Follow simple commands. Oriented to person place and time. UE motor 4+/5 prox to distal. LE: 3/5 HF, 3/5 KE and 4/5 ADF/PF. No gross sensory findings. Cognition appropriate.   Skin:  Ischemic vascular changes lower extremities  Psychiatric: She has a normal mood and affect. Her behavior is normal. Judgment and thought content normal.  Musculoskeletal:  4/5 bilateral UE. LE: 3/5 HF, KE and 4/5 ADF/PF. Senses pain and LT in all 4's   Results for orders placed or performed during the hospital encounter of 05/25/16 (from the past 48 hour(s))  Glucose, capillary     Status: Abnormal   Collection Time: 06/27/16  4:11 PM  Result  Value Ref Range   Glucose-Capillary 141 (H) 65 - 99 mg/dL  Glucose, capillary     Status: Abnormal   Collection Time: 06/27/16  9:52 PM  Result Value Ref Range   Glucose-Capillary 101 (H) 65 - 99 mg/dL   Comment 1 Notify RN   Basic metabolic panel     Status: Abnormal   Collection Time: 06/28/16  6:30 AM  Result Value Ref Range   Sodium 140 135 - 145 mmol/L   Potassium 3.9 3.5 - 5.1 mmol/L   Chloride 90 (L) 101 - 111 mmol/L   CO2 38 (H) 22 - 32 mmol/L   Glucose, Bld 108 (H) 65 - 99 mg/dL   BUN 32 (H) 6 - 20 mg/dL   Creatinine, Ser 1.19 (H) 0.44 - 1.00 mg/dL   Calcium 9.9 8.9 - 10.3 mg/dL   GFR calc non Af Amer 58 (L) >60 mL/min   GFR calc Af Amer >60 >60 mL/min    Comment: (NOTE) The eGFR has been calculated using the CKD EPI equation. This calculation has not been validated in all clinical situations. eGFR's persistently <60 mL/min signify possible Chronic Kidney Disease.    Anion gap 12 5 - 15  Magnesium     Status: None   Collection Time: 06/28/16  6:30 AM  Result Value Ref Range   Magnesium 2.1 1.7 - 2.4 mg/dL  Glucose, capillary     Status: Abnormal   Collection Time: 06/29/16 12:21 AM  Result Value Ref Range   Glucose-Capillary 114 (H) 65 - 99 mg/dL  Basic metabolic panel     Status: Abnormal   Collection Time: 06/29/16  3:00 AM  Result Value Ref Range   Sodium 141 135 - 145 mmol/L   Potassium 4.0 3.5 - 5.1 mmol/L   Chloride 90 (L) 101 - 111 mmol/L   CO2 37 (H) 22 - 32 mmol/L   Glucose, Bld 105 (H) 65 - 99 mg/dL   BUN 33 (H) 6 - 20 mg/dL   Creatinine, Ser 1.37 (H) 0.44 - 1.00 mg/dL   Calcium 10.1 8.9 - 10.3 mg/dL   GFR calc non Af Amer 49 (L) >60 mL/min   GFR calc Af Amer 57 (L) >60 mL/min    Comment: (NOTE) The eGFR has been calculated using the CKD EPI equation. This calculation has not been validated in all clinical situations. eGFR's persistently <60 mL/min signify possible Chronic Kidney Disease.    Anion gap 14 5 - 15  CBC     Status: Abnormal    Collection Time: 06/29/16  3:00 AM  Result Value Ref Range   WBC 6.2 4.0 - 10.5 K/uL   RBC 5.45 (H) 3.87 - 5.11 MIL/uL   Hemoglobin 13.6 12.0 - 15.0 g/dL   HCT 44.6 36.0 - 46.0 %   MCV 81.8 78.0 - 100.0 fL   MCH 25.0 (L) 26.0 - 34.0 pg   MCHC 30.5 30.0 - 36.0 g/dL   RDW 22.9 (H) 11.5 - 15.5 %   Platelets 155 150 - 400 K/uL  Glucose, capillary     Status: Abnormal   Collection Time: 06/29/16  3:45 AM  Result Value Ref Range   Glucose-Capillary 108 (H) 65 - 99 mg/dL  Glucose, capillary     Status: Abnormal   Collection Time: 06/29/16  8:18 AM  Result Value Ref Range   Glucose-Capillary 115 (H) 65 - 99 mg/dL  Glucose, capillary     Status: Abnormal   Collection Time: 06/29/16 11:33 AM  Result Value Ref Range   Glucose-Capillary 159 (H) 65 - 99 mg/dL  Dg Chest Port 1 View  Result Date: 06/28/2016 CLINICAL DATA:  Respiratory failure EXAM: PORTABLE CHEST 1 VIEW COMPARISON:  06/16/2016 FINDINGS: Tracheostomy remain in position. Right jugular central venous catheter tip in the SVC unchanged. Gastric tube is been removed. Improvement in bibasilar airspace disease which may be due to atelectasis or pneumonia. No effusion. IMPRESSION: Bibasilar airspace disease with interval improvement. Electronically Signed   By: Franchot Gallo M.D.   On: 06/28/2016 06:58       Medical Problem List and Plan: 1.  Debilitation secondary to CHF/respiratory failure  -admit to inpatient rehab today 2.  DVT Prophylaxis/Anticoagulation: Subcutaneous Lovenox. Monitor platelet counts and any signs of bleeding. Check vascular studies on admit 3. Pain Management: Tylenol as needed 4. 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 No current plans for decannulation. PMV for speech 5. Neuropsych: This patient is capable of making decisions on her own behalf. 6. Skin/Wound Care: Routine skin checks 7. Fluids/Electrolytes/Nutrition: Routine I&O with follow-up chemistries 8. Dysphagia. Dysphagia #1  honey thick liquids. Follow speech therapy. Monitor hydration closely. Attempt to keep patient on the drier side  -pt doesn't like diet but reviewed the importance of pushing her PO intake 9. Morbid obesity/hypoventilation syndrome.BMI 50.75. Plans to keep tracheostomy in place until she has a working CPAP machine in her possession as an outpatient 10. Hypertension/diastolic congestive heart failure:. Lasix 80 mg 3 times a day, Isordil 30 mg 3 times a day, labetalol 300 mg twice a day. Monitor with increased mobility  -keep "dry"  -follow daily weights   Post Admission Physician Evaluation: 1. Functional deficits secondary  to debility related to CHF and respiratory failure. 2. Patient is admitted to receive collaborative, interdisciplinary care between the physiatrist, rehab nursing staff, and therapy team. 3. Patient's level of medical complexity and substantial therapy needs in context of that medical necessity cannot be provided at a lesser intensity of care such as a SNF. 4. Patient has experienced substantial functional loss from his/her baseline which was documented above under the "Functional History" and "Functional Status" headings.  Judging by the patient's diagnosis, physical exam, and functional history, the patient has potential for functional progress which will result in measurable gains while on inpatient rehab.  These gains will be of substantial and practical use upon discharge  in facilitating mobility and self-care at the household level. 5. Physiatrist will provide 24 hour management of medical needs as well as oversight of the therapy plan/treatment and provide guidance as appropriate regarding the interaction of the two. 6. 24 hour rehab nursing will assist with bladder management, bowel management, safety, skin/wound care, disease management, medication administration and patient education  and help integrate therapy concepts, techniques,education, etc. 7. PT will assess and  treat for/with: Lower extremity strength, range of motion, stamina, balance, functional mobility, safety, adaptive techniques and equipment, activity tolerance, breathing techniques, NMR, family ed.   Goals are: mod I. 8. OT will assess and treat for/with: ADL's, functional mobility, safety, upper extremity strength, adaptive techniques and equipment, NMR, family ed, community reintegration.   Goals are: mod I to supervision. Therapy may proceed with showering this patient. 9. SLP will assess and treat for/with: speech, swallowing, communication.  Goals are: mod I. 10. Case Management and Social Worker will assess and treat for psychological issues and discharge planning. 11. Team conference will be held weekly to assess progress toward goals and to determine barriers to discharge. 12. Patient will receive at least 3 hours of therapy per day at  least 5 days per week. 13. ELOS: 9-12 days       14. Prognosis:  excellent     Meredith Staggers, MD, [place] 06/29/2016  06/29/2016

## 2016-06-29 NOTE — Plan of Care (Signed)
Problem: Education: Goal: Knowledge of [place] [facility] Education information/materials will improve Outcome: Progressing Discussed possible discharge and mobility with teach back displayed

## 2016-06-29 NOTE — Progress Notes (Signed)
Pt is stable at this time resting well watching tv. No distress noted. Pt is clear and diminished and refuse suctioning at this time

## 2016-06-29 NOTE — Progress Notes (Signed)
Pt is discharging to CIR- no further CSW involvement needed  CSW signing off  Jorge Ny, [place] Social Worker [phone]

## 2016-06-29 NOTE — Progress Notes (Signed)
I have insurance approval and will make the arrangements to admit pt to inpt rehab today. Dr. Sherral Hammers, RN CM and pt are aware and in agreement. [phone]

## 2016-06-29 NOTE — Progress Notes (Signed)
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 right handed female with history of uncontrolled hypertension, morbid obesity and medical noncompliance. Per chart review independent prior to admission she has a 64-year-old daughter. Presented 05/25/2016 with progressive dyspnea on exertion and orthopnea with lower extremity abdominal wall edema. Oxygen saturations in the 70s. Blood pressure elevated  215/151. Chest x-ray in the ED consistent with CHF. EKG showed sinus tachycardia but no acute ST elevations. CTA of chest negative for pulmonary emboli. Mildly elevated troponin 0.04 felt to be related to heart strain CHF. BNP elevated at 600. Placed on intravenous Lasix to begin diuresis as per cardiology services. Wound care consulted for bilateral lower extremity edema and weeping of wounds. Echocardiogram with ejection fraction of 86% normal systolic function. Required intubation for respiratory failure 05/28/2016 per critical care medicine and later tracheostomy tube required completed 06/11/2016 per Dr. Redmond Baseman.. Patient now tolerating ATC throughout the day and currently with a #6 proximal XLT trach. Subcutaneous Lovenox for DVT prophylaxis. Nasogastric tube for nutritional support and currently remains NPO. Physical and occupational therapy evaluations completed and ongoing with slow progressive gains. M.D. has requested physical medicine rehabilitation  consult.   Review of Systems  Unable to perform ROS: Acuity of condition       Past Medical History:  Diagnosis Date  . Diabetes mellitus without complication (HCC)    GDM  . Gestational diabetes mellitus in pregnancy 05/24/2013  . HTN in pregnancy, chronic 05/24/2013  . Hypertension   . Morbid obesity ([place])   . Sinus tachycardia         Past Surgical History:  Procedure Laterality Date  . TRACHEOSTOMY TUBE PLACEMENT N/A 06/11/2016   Procedure: TRACHEOSTOMY;  Surgeon: Melida Quitter, MD;  Location: [facility] OR;  Service: ENT;  Laterality: N/A;        Family History  Problem Relation Age of Onset  . Hypertension Mother   . Diabetes Other    Social History:  reports that she has never smoked. She has never used smokeless tobacco. She reports that she does not drink alcohol or use drugs. Allergies:  Allergies  Allergen Reactions  . No Known Allergies          Medications Prior to Admission  Medication Sig Dispense Refill  . ibuprofen (ADVIL,MOTRIN) 200 MG tablet Take 600 mg by mouth every 6 (six) hours as needed for mild pain.      Home: Home Living Family/patient expects to be discharged to:: Private residence Living Arrangements: Parent Available Help at Discharge: Family, Available PRN/intermittently Type of Home: House Home Access: Level entry [place]: One level Bathroom Shower/Tub: Multimedia programmer: [place]: None Additional Comments: Pt has a 71 year old daughter.  Pt was going to move into [place] soon per mom  Functional History: Prior Function Level of Independence: Independent Functional Status:  Mobility: Bed Mobility Overal bed mobility: Needs Assistance Bed Mobility: Supine to Sit, Sit to Supine Rolling: Mod assist Supine to sit: Min assist Sit  to supine: Mod assist, +2 for physical assistance General bed mobility comments: HOB elevated, pt used rail and scooted hips, min A for trunk; to supine,  assist for bilateral LE's Transfers Overall transfer level: Needs assistance Equipment used: Rolling walker (2 wheeled) Transfers: Sit to/from Stand Sit to Stand: +2 physical assistance, Mod assist  Lateral/Scoot Transfers: +2 physical assistance, Max assist General transfer comment: cues for hand placement and assist to rise, used momentum, perfomed x 2, second trial from elevated surface, pt unable to maintain balance with attempt to side step Ambulation/Gait Ambulation/Gait assistance: Mod assist, +2 safety/equipment Ambulation Distance (Feet): 1 Feet Assistive device: Rolling walker (2 wheeled) Gait Pattern/deviations: Step-to pattern, Decreased stride length, Decreased step length - right, Decreased step length - left, Drifts right/left, Trunk flexed, Wide base of support General Gait Details: side steps up toward [facility]; attempted marching in place and L knee buckled when lifting right and pt sat on EOB Gait velocity interpretation: Below normal speed for age/gender    ADL: ADL Overall ADL's : Needs assistance/impaired Grooming: Wash/dry hands, Sitting, Set up (applied lotion to hands) Grooming Details (indicate cue type and reason): wiped eyes with wet washcloth, swabbed mouth, NGT limits ability to wash full face Toileting- Clothing Manipulation and Hygiene: Total assistance, Sit to/from stand Toileting - Clothing Manipulation Details (indicate cue type and reason): performed pericare General ADL Comments: Pt with louder speaking volume today. Continues to be readily willing to work with therapy.  Cognition: Cognition Overall Cognitive Status: Within Functional Limits for tasks assessed Orientation Level: Oriented X4 Cognition Arousal/Alertness: Awake/alert Behavior During Therapy: WFL for tasks assessed/performed Overall Cognitive Status: Within Functional Limits for tasks assessed Area of Impairment: Attention Orientation Level: Disoriented to, Time, Situation Current  Attention Level: Sustained Memory: Decreased recall of precautions Following Commands: Follows one step commands consistently Safety/Judgement: Decreased awareness of safety, Decreased awareness of deficits Problem Solving: Requires verbal cues, Requires tactile cues, Difficulty sequencing General Comments: Pt attending during UB exercises to keep count of repetitions. Worked on placing call with house phone.Pt oriented to month and day.    Blood pressure (!) 150/90, pulse 88, temperature 98.3 F (36.8 C), temperature source Oral, resp. rate 10, height _0  (1.6 m), weight 79.4 kg (175 lb), SpO2 99 %, unknown if currently breastfeeding. Physical Exam  Constitutional:  35 year old right-handed morbidly obese female  HENT:  Head: Normocephalic.  Nasogastric tube in place  Eyes: EOM are normal.  Neck:  #6 trach  Cardiovascular: Regular rhythm.   Tachycardic   Respiratory:  Decreased breath sounds at the bases but clear to auscultation  GI: Soft. Bowel sounds are normal. She exhibits no distension. There is no tenderness.  Musculoskeletal:  4/5 bilateral UE. LE: 3/5 HF, KE and 4/5 ADF/PF. Senses pain and LT in all 4's  Neurological: She is alert.  Makes eye contact with examiner. Follows basic commands. Yes and  no head nods to simple questions    Lab Results Last 24 Hours       Results for orders placed or performed during the hospital encounter of 05/25/16 (from the past 24 hour(s))  Glucose, capillary     Status: Abnormal   Collection Time: 06/23/16  8:21 AM  Result Value Ref Range   Glucose-Capillary 117 (H) 65 - 99 mg/dL  Glucose, capillary     Status: Abnormal   Collection Time: 06/23/16 12:10 PM  Result Value Ref Range   Glucose-Capillary 145 (H) 65 - 99 mg/dL  Glucose, capillary  Status: Abnormal   Collection Time: 06/23/16  4:21 PM  Result Value Ref Range   Glucose-Capillary 111 (H) 65 - 99 mg/dL  Glucose, capillary     Status: Abnormal   Collection  Time: 06/23/16  8:27 PM  Result Value Ref Range   Glucose-Capillary 112 (H) 65 - 99 mg/dL   Comment 1 Notify RN    Comment 2 Document in Chart       Imaging Results (Last 48 hours)  Dg Abd Portable 1v  Result Date: 06/22/2016 CLINICAL DATA:  Advancement of NG tube. EXAM: PORTABLE ABDOMEN - 1 VIEW COMPARISON:  Earlier same day FINDINGS: Nasogastric tube is been advanced, passing along the greater curvature of the stomach with its tip in the midbody. IMPRESSION: Nasogastric tube well positioned in the stomach with its tip at the mid body. Electronically Signed   By: Nelson Chimes M.D.   On: 06/22/2016 07:53   Dg Abd Portable 1v  Result Date: 06/22/2016 CLINICAL DATA:  Nasogastric tube placement EXAM: PORTABLE ABDOMEN - 1 VIEW COMPARISON:  06/15/2016 FINDINGS: The nasogastric tube tip is in the low thoracic esophagus. It does not extend below the diaphragm. Recommend advancement at least 10 cm. IMPRESSION: Nasogastric tube does not reach the stomach. This needs to be advanced at least 10 cm. These results will be called to the ordering clinician or representative by the Radiologist Assistant, and communication documented in the PACS or zVision Dashboard. Electronically Signed   By: Andreas Newport M.D.   On: 06/22/2016 06:35     Assessment/Plan: Diagnosis: Debility due to CHF/respiratory failure in an obese 35 yo female 1. Does the need for close, 24 hr/day medical supervision in concert with the patient's rehab needs make it unreasonable for this patient to be served in a less intensive setting? Yes 2. Co-Morbidities requiring supervision/potential complications: AKI, pnemonia 3. Due to bladder management, bowel management, safety, skin/wound care, disease management, medication administration, pain management and patient education, does the patient require 24 hr/day rehab nursing? Yes 4. Does the patient require coordinated care of a physician, rehab nurse, PT (1-2 hrs/day, 5  days/week), OT (1-2 hrs/day, 5 days/week) and SLP (1-2 hrs/day, 5 days/week) to address physical and functional deficits in the context of the above medical diagnosis(es)? Yes Addressing deficits in the following areas: balance, endurance, locomotion, strength, transferring, bowel/bladder control, bathing, dressing, feeding, grooming, toileting, swallowing and psychosocial support 5. Can the patient actively participate in an intensive therapy program of at least 3 hrs of therapy per day at least 5 days per week? Yes 6. The potential for patient to make measurable gains while on inpatient rehab is excellent 7. Anticipated functional outcomes upon discharge from inpatient rehab are modified independent  with PT, modified independent with OT, modified independent with SLP. 8. Estimated rehab length of stay to reach the above functional goals is: 16-22 days 9. Does the patient have adequate social supports and living environment to accommodate these discharge functional goals? Yes 10. Anticipated D/C setting: Home 11. Anticipated post D/C treatments: HH therapy and Outpatient therapy 12. Overall Rehab/Functional Prognosis: excellent  RECOMMENDATIONS: This patient's condition is appropriate for continued rehabilitative care in the following setting: CIR Patient has agreed to participate in recommended program. Yes Note that insurance prior authorization may be required for reimbursement for recommended care.  Comment: Rehab Admissions Coordinator to follow up.  Thanks,  [name] Staggers, MD, [facility]     06/24/2016    Revision History  Routing History                    

## 2016-06-29 NOTE — Interval H&P Note (Signed)
Clark Clowdus was admitted today to Inpatient Rehabilitation with the diagnosis of debility after CHF/respiratory failure.  The patient's history has been reviewed, patient examined, and there is no change in status.  Patient continues to be appropriate for intensive inpatient rehabilitation.  I have reviewed the patient's chart and labs.  Questions were answered to the patient's satisfaction. The PAPE has been reviewed and assessment remains appropriate.  Jamayah Myszka T 06/29/2016, 7:18 PM

## 2016-06-29 NOTE — Discharge Summary (Signed)
Physician Discharge Summary  [name] [name] YKD:[id number] DOB: [birth year] DOA: 05/25/2016  PCP: No PCP Per Patient  Admit date: 05/25/2016 Discharge date: 06/29/2016  Time spent: 35 minutes  Recommendations for Outpatient Follow-up:  Acute on Chronic Hypoxemic and Hypercarbic Respiratory Failure  -S/p tracheostomy -Only once O2 successfully weaned can we start capping trials with nocturnal CPAP. If she tolerates 28% consider capping trial .  Acute Diastolic CHF  -Strict in and out since admission - 8.2  L -Daily weight Filed Weights   06/28/16 0600 06/28/16 1530 06/29/16 0500  Weight: 112.3 kg (247 lb 9.2 oz) (!) 153 kg (337 lb 4.9 oz) 86.6 kg (190 lb 14.7 oz)  -Lasix 80 mg TID -Hydralazine 100 mg TID -Isosorbide dinitrate 30 mg BID -Increase Labetalol 300 mg BID  Secondary pulmonary hypertension -As per CHF tx plan   Hypertensive Urgency -Resolved - BP currently well controlled - follow trend   Severe OSA / OHS with acute decompensation  -CPAP PRN  AKI / Azotemia 2/2 diuresis -Diuretic per cardiology  HCAP - completed 6 days of empiric abx tx  -Suction tracheostomy q shift  Demand ischemia -Cards following   Hypernatremia  -Resolved continue to monitor closely  Hypokalemia -Corrected  Morbid obesity - Body mass index is 50.75 kg/m. -11/5 start patient on 2000-calorie per day diet  Altered Mental Status -Resolved   Depression -family reports pt is in "denial" - seen by Psych 05/28/16  Diabetes type 2 uncontrolled with complication -39/7 Hemoglobin A1c= 7.9  Dysphagia -Resolved     Discharge Diagnoses:  Principal Problem:   Acute congestive heart failure (HCC) Active Problems:   Malignant hypertension   Morbid obesity (HCC)   OSA (obstructive sleep apnea)   Renal insufficiency   Acute pulmonary edema (HCC)   Abdominal pain   Hepatic congestion   Sinus tachycardia   Pericardial effusion   Pulmonary hypertension   Acute  respiratory failure with hypoxemia (HCC)   Difficult intravenous access   Sinus pause   HCAP (healthcare-associated pneumonia)   Acute respiratory failure with hypoxia (HCC)   Acute on chronic respiratory failure with hypoxia and hypercapnia (HCC)   Tracheostomy status (HCC)   Acute diastolic CHF (congestive heart failure) (HCC)   Hypertensive urgency   Obesity hypoventilation syndrome ([place])   Acute kidney injury ([place])   Acute on chronic respiratory failure with hypoxia (HCC)   Moderate single current episode of major depressive disorder ([place])   Uncontrolled type 2 diabetes mellitus with complication ([place])   Demand ischemia ([place])   Discharge Condition: Stable  Diet recommendation: Heart healthy/American diabetic Association  Filed Weights   06/28/16 0600 06/28/16 1530 06/29/16 0500  Weight: 112.3 kg (247 lb 9.2 oz) (!) 153 kg (337 lb 4.9 oz) 86.6 kg (190 lb 14.7 oz)    History of present illness:  35 y.o.BF PMHx HTN, Sinus tachycardia, Gestational DM, Diabetes Type 2 Uncontrolled with complications, Super Morbid Obesity, OSA, Chronic Respiratory Failure, Medical Noncompliance   Who reports a 1-2 week history of progressive dyspnea on exertion and orthopnea with lower extremity and abdominal wall edema. She does not know how much weight she has gained in the past 2-3 months, but she knows that her weight is up. She has had noticeable increased abdominal girth. She explicitly denies chest pain, pressure, or tightness. No light-headedness or loss of consciousness.   She admits to noncompliance with recommended treatment for hypertension for YEARS.  During this hospitalization patient was treated for uterine chronic respiratory failure with hypoxia,  acute diastolic CHF, pulmonary hypertension and HCAP. Patient was placed on heart failure regimen and responded well, and received tracheostomy which has allowed her to transition off of vent support. Currently patient able to use PMV  with blow-by and is able to ambulate in hallway on O2.   Consultants:  Dr.Wesam Kathryne Sharper PCCM Dr.Kenneth C [facility]     Procedures/Significant Events:  10/4 Echocardiogram: LVEF=:  50%to 55%.-- Left atrium:  mildly dilated.- Right atrium: mildly dilated. - Pulmonary arteries PApeak pressure: 42 mm Hg (S). 10/05 Transferred to ICU due to AMS, hypercarbic respiratory failure 10/06 Bipap, ntg gtt &BP remains elevated in 334'D systolic. Net neg 5.5 L in last 24 hours with improvement in sr cr 10/07 intubated overnight .  10/07 -10/14 diuresed aggressively 10/14 Attempting wean 10/15 Weaning 10/18 febrile 10/21 Awake, following commands, PSV   Cultures 10/5 MRSA PCR negative 10/17 respiratory culture normal flora 10/18 blood left/right hand negative 10/19 tracheal aspirate normal flora   Antimicrobials: Vanc 10/18 -10/22 Zosyn 10/18 - 10/23   Devices               LINES / TUBES:  #6 cuffless extended trach>>   Discharge Exam: Vitals:   06/29/16 0700 06/29/16 0820 06/29/16 0833 06/29/16 1100  BP:  (!) 142/89 (!) 142/89   Pulse:  84 (!) 102   Resp:  16 (!) 21   Temp:  97.9 F (36.6 C)    TempSrc: Oral Oral    SpO2:  95% 93% 95%  Weight:      Height:        General: A/O 4, NAD, Positive acute respiratory distress Eyes: negative scleral hemorrhage, negative anisocoria, negative icterus ENT: Negative Runny nose, negative gingival bleeding, Neck:  Negative scars, masses, torticollis, lymphadenopathy, JVD, tracheostomy and placed Lungs: clear to auscultation bilateral, negative wheezing/crackles  Cardiovascular: Regular rate and rhythm without murmur gallop or rub normal S1 and S2 Abdomen: MORBIDLY OBESE, negative abdominal pain, nondistended, positive soft, bowel sounds, no rebound, no ascites, no appreciable mass Extremities: No significant cyanosis, clubbing, bilateral lower extremity obesity with edema bilaterally    Discharge  Instructions     Medication List    TAKE these medications   acetaminophen 160 MG/5ML solution Commonly known as:  TYLENOL Take 20.3 mLs (650 mg total) by mouth every 6 (six) hours as needed for mild pain, headache or fever.   chlorhexidine gluconate (MEDLINE KIT) 0.12 % solution Commonly known as:  PERIDEX 15 mLs by Mouth Rinse route 2 (two) times daily.   diphenoxylate-atropine 2.5-0.025 MG tablet Commonly known as:  LOMOTIL Take 1 tablet by mouth 2 (two) times daily as needed for diarrhea or loose stools.   enoxaparin 40 MG/0.4ML injection Commonly known as:  LOVENOX Inject 0.4 mLs (40 mg total) into the skin daily.   famotidine 20 MG tablet Commonly known as:  PEPCID Take 1 tablet (20 mg total) by mouth 2 (two) times daily.   feeding supplement (PRO-STAT SUGAR FREE 64) Liqd Take 30 mLs by mouth 2 (two) times daily between meals.   fentaNYL 100 MCG/2ML injection Commonly known as:  SUBLIMAZE Inject 0.5-1 mLs (25-50 mcg total) into the vein every hour as needed for severe pain.   furosemide 80 MG tablet Commonly known as:  LASIX Take 1 tablet (80 mg total) by mouth 3 (three) times daily.   hydrALAZINE 20 MG/ML injection Commonly known as:  APRESOLINE Inject 0.5 mLs (10 mg total) into the vein every 4 (four) hours as  needed (prn SBP > 150).   hydrALAZINE 100 MG tablet Commonly known as:  APRESOLINE Take 1 tablet (100 mg total) by mouth every 8 (eight) hours.   ibuprofen 200 MG tablet Commonly known as:  ADVIL,MOTRIN Take 600 mg by mouth every 6 (six) hours as needed for mild pain.   isosorbide dinitrate 30 MG tablet Commonly known as:  ISORDIL Take 1 tablet (30 mg total) by mouth 3 (three) times daily.   labetalol 300 MG tablet Commonly known as:  NORMODYNE Take 1 tablet (300 mg total) by mouth 2 (two) times daily.   multivitamin with minerals Tabs tablet Take 1 tablet by mouth daily. Start taking on:  06/30/2016   phenol 1.4 % Liqd Commonly known as:   CHLORASEPTIC Use as directed 1 spray in the mouth or throat as needed for throat irritation / pain.   potassium chloride SA 20 MEQ tablet Commonly known as:  K-DUR,KLOR-CON Take 1 tablet (20 mEq total) by mouth 2 (two) times daily.      Allergies  Allergen Reactions  . No Known Allergies       The results of significant diagnostics from this hospitalization (including imaging, microbiology, ancillary and laboratory) are listed below for reference.    Significant Diagnostic Studies: Dg Chest Port 1 View  Result Date: 06/28/2016 CLINICAL DATA:  Respiratory failure EXAM: PORTABLE CHEST 1 VIEW COMPARISON:  06/16/2016 FINDINGS: Tracheostomy remain in position. Right jugular central venous catheter tip in the SVC unchanged. Gastric tube is been removed. Improvement in bibasilar airspace disease which may be due to atelectasis or pneumonia. No effusion. IMPRESSION: Bibasilar airspace disease with interval improvement. Electronically Signed   By: Franchot Gallo M.D.   On: 06/28/2016 06:58   Dg Chest Port 1 View  Result Date: 06/16/2016 CLINICAL DATA:  Pulmonary edema. Hx of DM, HTN, morbid obesity, sinus tachycardia. EXAM: PORTABLE CHEST 1 VIEW COMPARISON:  06/14/2016 FINDINGS: Tracheostomy tube, NGT, central line unchanged. Stable enlarged cardiac silhouette. There is perihilar venous congestion increased prior. Bibasilar atelectasis. IMPRESSION: Increasing vascular congestion, cardiomegaly and basilar atelectasis. Findings suggest worsening congestive heart failure. Electronically Signed   By: Suzy Bouchard M.D.   On: 06/16/2016 16:24   Dg Chest Port 1 View  Result Date: 06/14/2016 CLINICAL DATA:  Respiratory failure. EXAM: PORTABLE CHEST 1 VIEW COMPARISON:  06/07/2016. FINDINGS: Tracheostomy tube and NG tube in stable position. Right IJ line in stable position. Cardiomegaly with pulmonary venous congestion in mild bilateral from interstitial prominence again noted. Findings consistent  with congestive heart failure . IMPRESSION: 1. Lines and tubes in stable position. 2. Congestive heart failure with mild pulmonary interstitial edema. Similar findings on prior exam . Electronically Signed   By: [place]   On: 06/14/2016 07:30   Dg Chest Port 1 View  Result Date: 06/07/2016 CLINICAL DATA:  Hypoxia EXAM: PORTABLE CHEST 1 VIEW COMPARISON:  June 06, 2016 FINDINGS: Endotracheal tube tip is 3.7 cm above the carina. Nasogastric tube tip and side port are below the diaphragm. Central catheter tip is in the superior vena cava. No pneumothorax. There is a small pleural effusion on the right. There is mild interstitial edema. Patchy airspace consolidation in the medial base regions is stable. There is cardiomegaly with pulmonary venous hypertension. There is atherosclerotic calcification in the aorta. No evident adenopathy. IMPRESSION: Tube and catheter positions as described without pneumothorax. Changes of congestive heart failure remain. Airspace consolidation in the medial lung bases may represent alveolar edema or could represent superimposed pneumonia. Both  entities may exist concurrently. There is aortic atherosclerosis. Electronically Signed   By: Lowella Grip III M.D.   On: 06/07/2016 07:16   Dg Chest Port 1 View  Result Date: 06/06/2016 CLINICAL DATA:  Acute respiratory failure. EXAM: PORTABLE CHEST 1 VIEW COMPARISON:  June 05, 2016 FINDINGS: Stable support apparatus including the ETT. No pneumothorax. Stable cardiomegaly. Opacity in the medial right base is focal in similar to recent studies. Mild interstitial prominence, likely superimposed edema. IMPRESSION: 1. Persistent edema. 2. Persistent medial right basilar infiltrate. 3. Stable support apparatus. Electronically Signed   By: Dorise Bullion III M.D   On: 06/06/2016 07:21   Dg Chest Port 1 View  Result Date: 06/05/2016 CLINICAL DATA:  Ventilator dependence. EXAM: PORTABLE CHEST 1 VIEW COMPARISON:  06/04/2016  FINDINGS: 0440 hours. Endotracheal tube tip is 2.8 cm above the base of the carina. Right IJ central line tip overlies the mid SVC level. The NG tube passes into the stomach although the distal tip position is not included on the film. Lung volumes remain low with diffuse interstitial and bilateral airspace disease. The cardio pericardial silhouette is enlarged. Telemetry leads overlie the chest. IMPRESSION: No substantial change. Cardiomegaly with interstitial in diffuse bilateral airspace disease compatible with edema or pneumonia. Electronically Signed   By: Misty Stanley M.D.   On: 06/05/2016 07:52   Dg Chest Port 1 View  Result Date: 06/04/2016 CLINICAL DATA:  Intubation. History of hypertension and diabetes. Sinus tachycardia EXAM: PORTABLE CHEST 1 VIEW COMPARISON:  06/04/2016, 06/03/2016 FINDINGS: Endotracheal tube tip is approximately 9 mm superior to the carina. Esophageal tube tip is below the diaphragm but is not included. Right-sided central venous catheter tip poorly visualized, it appears to overlie the cavoatrial region. There are low lung volumes. There is interval increase in bibasilar airspace opacification. There are tiny bilateral effusions. There is stable moderate enlargement of the cardiomediastinal rim. No pneumothorax. IMPRESSION: 1. Tip of the endotracheal tube is near the carina. 2. Markedly low lung volumes with increasing bibasilar atelectasis or pneumonia. Suspect small effusions. 3. Stable moderate to marked cardiomegaly with central vascular congestion. Electronically Signed   By: Donavan Foil M.D.   On: 06/04/2016 14:04   Dg Chest Port 1 View  Result Date: 06/04/2016 CLINICAL DATA:  Endotracheal tube EXAM: PORTABLE CHEST 1 VIEW COMPARISON:  Yesterday FINDINGS: Endotracheal tube tip at the clavicular heads. An orogastric tube reaches stomach at least. Right IJ central line with tip at the upper cavoatrial junction. Low lung volumes with diffuse perihilar and basilar  opacity. Marked cardiopericardial enlargement. No effusion or pneumothorax. IMPRESSION: 1. Stable positioning of tubes and central line. 2. Low volume chest with bilateral atelectasis or pneumonia. 3. Cardiomegaly and at least pulmonary venous congestion. Electronically Signed   By: Monte Fantasia M.D.   On: 06/04/2016 07:13   Dg Chest Port 1 View  Result Date: 06/03/2016 CLINICAL DATA:  Hypoxia EXAM: PORTABLE CHEST 1 VIEW COMPARISON:  June 02, 2016 FINDINGS: Endotracheal tube tip is 4.6 cm above the carina. Nasogastric tube tip and side port are below the diaphragm. Central catheter tip is in the superior vena cava. No pneumothorax. There is persistent interstitial pulmonary edema with patchy alveolar opacity in the bases, likely edema. There is cardiomegaly with pulmonary venous hypertension. There is a small right pleural effusion. No adenopathy evident. IMPRESSION: Than catheter positions as described without pneumothorax. Evidence of a degree of congestive heart failure. There appears to be slightly less alveolar opacity in the left perihilar region  compared to 1 day prior. Lungs and cardiac silhouette otherwise unchanged. Electronically Signed   By: Lowella Grip III M.D.   On: 06/03/2016 07:09   Dg Chest Port 1 View  Result Date: 06/02/2016 CLINICAL DATA:  intubated EXAM: PORTABLE CHEST 1 VIEW COMPARISON:  06/01/2016 FINDINGS: Cardiomegaly again noted. Central vascular congestion and diffuse airspace disease bilaterally again noted consistent with pulmonary edema. Stable endotracheal and NG tube position. There is right IJ central line with tip in right atrium. No pneumothorax. IMPRESSION: Central vascular congestion and diffuse airspace disease bilaterally again noted consistent with pulmonary edema. Stable endotracheal and NG tube position. There is right IJ central line with tip in right atrium. No pneumothorax. Electronically Signed   By: Lahoma Crocker M.D.   On: 06/02/2016 07:57   Dg  Chest Port 1 View  Result Date: 06/01/2016 CLINICAL DATA:  Central line insertion EXAM: PORTABLE CHEST 1 VIEW COMPARISON:  06/01/2016 FINDINGS: Endotracheal tube and NG tube are unchanged. Right central line is been placed with the tip at the cavoatrial junction. No pneumothorax. There is cardiomegaly with diffuse bilateral airspace disease, likely edema/ CHF. Low lung volumes with bibasilar atelectasis. No visible effusions. IMPRESSION: Right central line tip at the cavoatrial junction.  No pneumothorax. Mild edema/ CHF.  Low lung volumes. Electronically Signed   By: Rolm Baptise M.D.   On: 06/01/2016 10:06   Dg Chest Port 1 View  Result Date: 06/01/2016 CLINICAL DATA:  Malignant hypertension, CHF, pulmonary edema, morbid obesity. EXAM: PORTABLE CHEST 1 VIEW COMPARISON:  Portable chest x-ray of May 31, 2016 FINDINGS: The lungs remain hypoinflated. The interstitial markings remain increased. The pulmonary vascularity remains engorged. The cardiac silhouette is markedly enlarged. The hemidiaphragms remain obscured. The endotracheal tube tip lies approximately 3.5 cm above the carina. The esophagogastric tube tip projects below the inferior margin of the image. IMPRESSION: CHF with pulmonary interstitial and alveolar edema. Probable small bilateral pleural effusions. Basilar pneumonia is not excluded. Allowing for differences in positioning there has not been significant interval change. The support tubes are in reasonable position. Electronically Signed   By: David  [place] M.D.   On: 06/01/2016 07:35   Dg Chest Port 1 View  Result Date: 05/31/2016 CLINICAL DATA:  ET tube placement EXAM: PORTABLE CHEST 1 VIEW COMPARISON:  05/30/2016 FINDINGS: Endotracheal tube with the tip 2.5 cm above the carina. Nasogastric tube coursing below the diaphragm. Bilateral interstitial and alveolar airspace opacities with enlargement of the central pulmonary vasculature. No pneumothorax. Trace bilateral pleural effusions.  Stable cardiomegaly. No acute osseous abnormality. IMPRESSION: 1. Endotracheal tube with the tip 2.5 cm above the carina. 2. Bilateral interstitial and alveolar airspace opacities with cardiomegaly compatible with CHF versus multilobar pneumonia. Electronically Signed   By: Kathreen Devoid   On: 05/31/2016 07:03   Dg Abd Portable 1v  Result Date: 06/22/2016 CLINICAL DATA:  Advancement of NG tube. EXAM: PORTABLE ABDOMEN - 1 VIEW COMPARISON:  Earlier same day FINDINGS: Nasogastric tube is been advanced, passing along the greater curvature of the stomach with its tip in the midbody. IMPRESSION: Nasogastric tube well positioned in the stomach with its tip at the mid body. Electronically Signed   By: Nelson Chimes M.D.   On: 06/22/2016 07:53   Dg Abd Portable 1v  Result Date: 06/22/2016 CLINICAL DATA:  Nasogastric tube placement EXAM: PORTABLE ABDOMEN - 1 VIEW COMPARISON:  06/15/2016 FINDINGS: The nasogastric tube tip is in the low thoracic esophagus. It does not extend below the diaphragm. Recommend advancement  at least 10 cm. IMPRESSION: Nasogastric tube does not reach the stomach. This needs to be advanced at least 10 cm. These results will be called to the ordering clinician or representative by the Radiologist Assistant, and communication documented in the PACS or zVision Dashboard. Electronically Signed   By: Andreas Newport M.D.   On: 06/22/2016 06:35   Dg Abd Portable 1v  Result Date: 06/16/2016 CLINICAL DATA:  NG tube placement EXAM: PORTABLE ABDOMEN - 1 VIEW COMPARISON:  06/13/2016 FINDINGS: Single portable view of the upper abdomen. Heart size appears enlarged. Esophageal tube tip is below the diaphragm, tip is not included on the imaged. IMPRESSION: Limited image of the upper abdomen demonstrates esophageal tube to be below the diaphragm, tip is non included but suspected to overlie the mid stomach. Electronically Signed   By: Donavan Foil M.D.   On: 06/16/2016 04:05   Dg Abd Portable  1v  Result Date: 06/13/2016 CLINICAL DATA:  35 year old female status post nasogastric tube placement. EXAM: PORTABLE ABDOMEN - 1 VIEW COMPARISON:  Abdominal radiograph 06/12/2016. FINDINGS: Nasogastric tube coiled in the stomach with tip in the fundus. Gas is noted throughout portions of the colon and small bowel. No pathologic dilatation of small bowel. Some distal rectal gas is noted. No gross evidence of pneumoperitoneum. IUD projecting over the central pelvis. IMPRESSION: 1. Nasogastric tube is in the stomach with the tip directed retrograde toward the fundus. 2. Nonobstructive bowel gas pattern. Electronically Signed   By: Vinnie Langton M.D.   On: 06/13/2016 15:22   Dg Abd Portable 1v  Result Date: 06/12/2016 CLINICAL DATA:  Nasogastric tube placement. EXAM: PORTABLE ABDOMEN - 1 VIEW COMPARISON:  Abdominal radiograph June 12, 2016 FINDINGS: Nasogastric tube tip projects in proximal stomach, interval removal of feeding tube. Side port of nasogastric tube not identified. Bowel gas pattern is nondilated and nonobstructive. Large body habitus. IMPRESSION: Nasogastric tube tip projects in proximal stomach. Electronically Signed   By: Elon Alas M.D.   On: 06/12/2016 23:32   Dg Abd Portable 1v  Result Date: 06/12/2016 CLINICAL DATA:  35 year old female status post feeding tube placement. EXAM: PORTABLE ABDOMEN - 1 VIEW COMPARISON:  05/28/2016. FINDINGS: Feeding tube in position with tip in the antral pre-pyloric region of the stomach. Visualized bowel gas pattern is unremarkable. IMPRESSION: Tip of feeding tube in the antral pre-pyloric region of the stomach. Electronically Signed   By: Vinnie Langton M.D.   On: 06/12/2016 16:55    Microbiology: No results found for this or any previous visit (from the past 240 hour(s)).   Labs: Basic Metabolic Panel:  Recent Labs Lab 06/24/16 0531 06/27/16 0300 06/28/16 0630 06/29/16 0300  NA 136 140 140 141  K 4.3 4.0 3.9 4.0  CL 89*  90* 90* 90*  CO2 35* 37* 38* 37*  GLUCOSE 105* 108* 108* 105*  BUN 30* 35* 32* 33*  CREATININE 0.88 1.13* 1.19* 1.37*  CALCIUM 9.7 9.8 9.9 10.1  MG  --  2.1 2.1  --    Liver Function Tests: No results for input(s): AST, ALT, ALKPHOS, BILITOT, PROT, ALBUMIN in the last 168 hours. No results for input(s): LIPASE, AMYLASE in the last 168 hours. No results for input(s): AMMONIA in the last 168 hours. CBC:  Recent Labs Lab 06/29/16 0300  WBC 6.2  HGB 13.6  HCT 44.6  MCV 81.8  PLT 155   Cardiac Enzymes: No results for input(s): CKTOTAL, CKMB, CKMBINDEX, TROPONINI in the last 168 hours. BNP: BNP (last 3  results)  Recent Labs  05/25/16 1240  BNP 594.3*    ProBNP (last 3 results) No results for input(s): PROBNP in the last 8760 hours.  CBG:  Recent Labs Lab 06/27/16 2152 06/29/16 0021 06/29/16 0345 06/29/16 0818 06/29/16 1133  GLUCAP 101* 114* 108* 115* 159*       Signed:  Dia Crawford, MD [facility] [phone] pager

## 2016-06-30 ENCOUNTER — Inpatient Hospital Stay (HOSPITAL_COMMUNITY): Payer: Self-pay | Admitting: Occupational Therapy

## 2016-06-30 ENCOUNTER — Inpatient Hospital Stay (HOSPITAL_COMMUNITY): Payer: PRIVATE HEALTH INSURANCE

## 2016-06-30 ENCOUNTER — Inpatient Hospital Stay (HOSPITAL_COMMUNITY): Payer: PRIVATE HEALTH INSURANCE | Admitting: Speech Pathology

## 2016-06-30 ENCOUNTER — Inpatient Hospital Stay (HOSPITAL_COMMUNITY): Payer: PRIVATE HEALTH INSURANCE | Admitting: Physical Therapy

## 2016-06-30 DIAGNOSIS — M7989 Other specified soft tissue disorders: Secondary | ICD-10-CM

## 2016-06-30 DIAGNOSIS — E46 Unspecified protein-calorie malnutrition: Secondary | ICD-10-CM

## 2016-06-30 DIAGNOSIS — N179 Acute kidney failure, unspecified: Secondary | ICD-10-CM

## 2016-06-30 DIAGNOSIS — I10 Essential (primary) hypertension: Secondary | ICD-10-CM

## 2016-06-30 DIAGNOSIS — E66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: Secondary | ICD-10-CM

## 2016-06-30 DIAGNOSIS — E8809 Other disorders of plasma-protein metabolism, not elsewhere classified: Secondary | ICD-10-CM

## 2016-06-30 LAB — COMPREHENSIVE METABOLIC PANEL
ALT: 41 U/L (ref 14–54)
AST: 43 U/L — ABNORMAL HIGH (ref 15–41)
Albumin: 3.2 g/dL — ABNORMAL LOW (ref 3.5–5.0)
Alkaline Phosphatase: 112 U/L (ref 38–126)
Anion gap: 14 (ref 5–15)
BUN: 32 mg/dL — ABNORMAL HIGH (ref 6–20)
CO2: 34 mmol/L — ABNORMAL HIGH (ref 22–32)
Calcium: 9.9 mg/dL (ref 8.9–10.3)
Chloride: 91 mmol/L — ABNORMAL LOW (ref 101–111)
Creatinine, Ser: 1.5 mg/dL — ABNORMAL HIGH (ref 0.44–1.00)
GFR calc Af Amer: 51 mL/min — ABNORMAL LOW (ref >60)
GFR calc non Af Amer: 44 mL/min — ABNORMAL LOW (ref >60)
Glucose, Bld: 98 mg/dL (ref 65–99)
Potassium: 3.7 mmol/L (ref 3.5–5.1)
Sodium: 139 mmol/L (ref 135–145)
Total Bilirubin: 1.3 mg/dL — ABNORMAL HIGH (ref 0.3–1.2)
Total Protein: 7.7 g/dL (ref 6.5–8.1)

## 2016-06-30 LAB — CBC WITH DIFFERENTIAL/PLATELET
Basophils Absolute: 0.1 10*3/uL (ref 0.0–0.1)
Basophils Relative: 1 %
Eosinophils Absolute: 0.9 10*3/uL — ABNORMAL HIGH (ref 0.0–0.7)
Eosinophils Relative: 17 %
HCT: 43.9 % (ref 36.0–46.0)
Hemoglobin: 13.3 g/dL (ref 12.0–15.0)
Lymphocytes Relative: 23 %
Lymphs Abs: 1.2 10*3/uL (ref 0.7–4.0)
MCH: 25 pg — ABNORMAL LOW (ref 26.0–34.0)
MCHC: 30.3 g/dL (ref 30.0–36.0)
MCV: 82.4 fL (ref 78.0–100.0)
Monocytes Absolute: 0.6 10*3/uL (ref 0.1–1.0)
Monocytes Relative: 12 %
Neutro Abs: 2.2 10*3/uL (ref 1.7–7.7)
Neutrophils Relative %: 47 %
Platelets: 165 10*3/uL (ref 150–400)
RBC: 5.33 MIL/uL — ABNORMAL HIGH (ref 3.87–5.11)
RDW: 23.6 % — ABNORMAL HIGH (ref 11.5–15.5)
WBC: 5 10*3/uL (ref 4.0–10.5)

## 2016-06-30 LAB — GLUCOSE, CAPILLARY
Glucose-Capillary: 99 mg/dL (ref 65–99)
Glucose-Capillary: 128 mg/dL — ABNORMAL HIGH (ref 65–99)
Glucose-Capillary: 145 mg/dL — ABNORMAL HIGH (ref 65–99)
Glucose-Capillary: 119 mg/dL — ABNORMAL HIGH (ref 65–99)

## 2016-06-30 MED ORDER — INSULIN ASPART 100 UNIT/ML ~~LOC~~ SOLN
0.0000 [IU] | SUBCUTANEOUS | Status: DC
  Administered 2016-06-30 (×2): 2 [IU] via SUBCUTANEOUS
  Administered 2016-07-01: 3 [IU] via SUBCUTANEOUS
  Administered 2016-07-02 – 2016-07-06 (×5): 2 [IU] via SUBCUTANEOUS

## 2016-06-30 MED ORDER — SODIUM CHLORIDE 0.9% FLUSH: 10.0000 mL | INTRAVENOUS | Status: DC | PRN

## 2016-06-30 MED ORDER — ENOXAPARIN SODIUM 80 MG/0.8ML ~~LOC~~ SOLN
75.0000 mg | SUBCUTANEOUS | Status: DC
  Administered 2016-06-30 – 2016-07-06 (×7): 75 mg via SUBCUTANEOUS
  Filled 2016-06-30 (×7): qty 0.8

## 2016-06-30 NOTE — Progress Notes (Signed)
Patient information reviewed and entered into [facility] system by Daiva Nakayama, RN, CRRN, [place] Coordinator.  Information including medical coding and functional independence measure will be reviewed and updated through discharge.

## 2016-06-30 NOTE — Care Management Note (Signed)
[facility] Individual Statement of Services  Patient Name:  [name] [name]  Date:  06/30/2016  Welcome to the [facility].  Our goal is to provide you with an individualized program based on your diagnosis and situation, designed to meet your specific needs.  With this comprehensive rehabilitation program, you will be expected to participate in at least 3 hours of rehabilitation therapies Monday-Friday, with modified therapy programming on the weekends.  Your rehabilitation program will include the following services:  Physical Therapy (PT), Occupational Therapy (OT), Speech Therapy (ST), 24 hour per day rehabilitation nursing, Therapeutic Recreaction (TR), Neuropsychology, Case Management (Social Worker), Rehabilitation Medicine, Nutrition Services and Pharmacy Services  Weekly team conferences will be held on Wednesday to discuss your progress.  Your Social Worker will talk with you frequently to get your input and to update you on team discussions.  Team conferences with you and your family in attendance may also be held.  Expected length of stay: 7-9 days  Overall anticipated outcome: mod/i level  Depending on your progress and recovery, your program may change. Your Social Worker will coordinate services and will keep you informed of any changes. Your Social Worker's name and contact numbers are listed  below.  The following services may also be recommended but are not provided by the [facility] will be made to provide these services after discharge if needed.  Arrangements include referral to agencies that provide these services.  Your insurance has been verified to be:  [place] Your primary doctor is:  none  Pertinent information will be shared with your doctor and your insurance  company.  Social Worker:  Ovidio Kin, [place] or ([phone]  Information discussed with and copy given to patient by: Elease Hashimoto, 06/30/2016, 1:23 PM

## 2016-06-30 NOTE — Progress Notes (Addendum)
[facility] PHYSICAL MEDICINE & REHABILITATION     PROGRESS NOTE  Subjective/Complaints:  Pt seen laying in bed this AM.  She slept well overnight.  Tolerating her PSV without issues.  She is looking forward to beginning therapies.  ROS: Denies CP, SOB, N/V/D.  Objective: Vital Signs: Blood pressure (!) 153/81, pulse 81, temperature 98 F (36.7 C), temperature source Oral, resp. rate 18, height _0  (1.651 m), weight (!) 151.3 kg (333 lb 9.6 oz), SpO2 97 %, unknown if currently breastfeeding. No results found.  Recent Labs  06/29/16 0300 06/30/16 0500  WBC 6.2 5.0  HGB 13.6 13.3  HCT 44.6 43.9  PLT 155 165    Recent Labs  06/29/16 0300 06/30/16 0500  NA 141 139  K 4.0 3.7  CL 90* 91*  GLUCOSE 105* 98  BUN 33* 32*  CREATININE 1.37* 1.50*  CALCIUM 10.1 9.9   CBG (last 3)   Recent Labs  06/29/16 0818 06/29/16 1133 06/30/16 0640  GLUCAP 115* 159* 99    Wt Readings from Last 3 Encounters:  06/30/16 (!) 151.3 kg (333 lb 9.6 oz)  06/29/16 86.6 kg (190 lb 14.7 oz)  12/12/13 (!) 167.8 kg (370 lb)    Physical Exam:  BP (!) 153/81 (BP Location: Left Wrist)   Pulse 81   Temp 98 F (36.7 C) (Oral)   Resp 18   Ht _1  (1.651 m)   Wt (!) 151.3 kg (333 lb 9.6 oz)   LMP  (LMP Unknown)   SpO2 97%   BMI 55.51 kg/m  Constitutional: Morbidly obese. NAD. Vital signs reviewed. HENT: Normocephalic. +Trach Eyes: EOM are normal. No discharge. Neck: +IJ. Cardiovascular: Normal rate and regular rhythm.  No JVD. Respiratory: Clear to auscultation. Normal effort. GI: Soft. Bowel sounds are normal. She exhibits no distension.  Musculoskeletal: +Edema. No tenderness. Neurological: She is alert and oriented.  Voice remains hoarse but intelligible.  Follow simple commands.  Motor: 4+/5 throughout Skin: Ischemic vascular changes b/l lower extremities  Psychiatric: She has a normal mood and affect. Her behavior is normal. Judgment and thought content normal.    Assessment/Plan: 1. Functional deficits secondary to CHF/respiratory failure which require 3+ hours per day of interdisciplinary therapy in a comprehensive inpatient rehab setting. Physiatrist is providing close team supervision and 24 hour management of active medical problems listed below. Physiatrist and rehab team continue to assess barriers to discharge/monitor patient progress toward functional and medical goals.  Function:  Bathing Bathing position      Bathing parts      Bathing assist        Upper Body Dressing/Undressing Upper body dressing                    Upper body assist        Lower Body Dressing/Undressing Lower body dressing                                  Lower body assist        Toileting Toileting          Toileting assist     Transfers Chair/bed transfer             Locomotion Ambulation           Wheelchair          Cognition Comprehension    Expression    Social Interaction  Problem Solving    Memory        Medical Problem List and Plan: 1.  Debilitation secondary to CHF/respiratory failure  Begin CIR 2.  DVT Prophylaxis/Anticoagulation: Subcutaneous Lovenox. Monitor platelet counts and any signs of bleeding.   Vascular studies pending 3. Pain Management: Tylenol as needed 4. 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 No current plans for decannulation. PMV for speech 5. Neuropsych: This patient is capable of making decisions on her own behalf. 6. Skin/Wound Care: Routine skin checks 7. Fluids/Electrolytes/Nutrition: Routine I&O 8. Dysphagia. Dysphagia #1 honey thick liquids. Follow speech therapy. Monitor hydration closely. Attempt to keep patient on the drier side 9. Morbid obesity/hypoventilation syndrome. BMI 50.75.   Plans to keep tracheostomy in place until she has a working CPAP machine in her possession as an outpatient 10. Hypertension/diastolic congestive  heart failure:. Lasix 80 mg 3 times a day, Isordil 30 mg 3 times a day, labetalol 300 mg twice a day. Monitor with increased mobility  Keep on drier side             Follow daily weights Filed Weights   06/30/16 0544  Weight: (!) 151.3 kg (333 lb 9.6 oz)  11. Diabetes mellitus type 2  SSI  Monitor with increased activity 12. AKI  Cr. 1.50 on 11/8  Will attempt to keep relatively dry, which will increase BUN/CR  Will cont to monitor 13. Hypoalbuminemia  Supplement started 11/8    LOS (Days) 1 A FACE TO FACE EVALUATION WAS PERFORMED  Rhea Thrun Lorie Phenix 06/30/2016 8:34 AM

## 2016-06-30 NOTE — Evaluation (Signed)
Speech Language Pathology Assessment and Plan  Patient Details  Name: [name] [name] MRN: [id number] Date of Birth: [birth year]  SLP Diagnosis: Voice disorder;Dysphagia  Rehab Potential: Good ELOS: 7 days     Today's Date: 06/30/2016 SLP Individual Time: 1300-1400 SLP Individual Time Calculation (min): 60 min    Problem List:  Patient Active Problem List   Diagnosis Date Noted  . Benign essential HTN   . AKI (acute kidney injury) ([place])   . Hypoalbuminemia due to protein-calorie malnutrition ([place])   . Debilitated 06/29/2016  . Demand ischemia ([place])   . Acute on chronic respiratory failure with hypoxia ([place])   . Moderate single current episode of major depressive disorder ([place])   . Uncontrolled type 2 diabetes mellitus with complication ([place])   . Tracheostomy status ([place])   . Acute diastolic CHF (congestive heart failure) ([place])   . Hypertensive urgency   . Obesity hypoventilation syndrome ([place])   . Acute kidney injury ([place])   . Acute on chronic respiratory failure with hypoxia and hypercapnia (HCC)   . Acute respiratory failure with hypoxia ([place])   . HCAP (healthcare-associated pneumonia)   . Sinus pause 06/07/2016  . Difficult intravenous access   . Acute respiratory failure with hypoxemia ([place])   . Hepatic congestion 05/26/2016  . Sinus tachycardia 05/26/2016  . Pericardial effusion 05/26/2016  . Pulmonary hypertension   . Renal insufficiency 05/25/2016  . Abnormal EKG 05/25/2016  . Acute pulmonary edema ([place]) 05/25/2016  . Abdominal pain 05/25/2016  . CHF (congestive heart failure) ([place]) 05/25/2016  . Acute congestive heart failure ([place]) 05/25/2016  . Oligohydramnios, delivered 05/27/2013  . NSVD (normal spontaneous vaginal delivery) 05/27/2013  . Pregnancy 11/02/2012  . OSA (obstructive sleep apnea) 07/09/2011  . 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 Morbid obesity ([place]) 05/18/2011   Past Medical History:  Past Medical History:  Diagnosis Date  . Diabetes  mellitus without complication (HCC)    GDM  . Gestational diabetes mellitus in pregnancy 05/24/2013  . HTN in pregnancy, chronic 05/24/2013  . Hypertension   . Morbid obesity ([place])   . Sinus tachycardia    Past Surgical History:  Past Surgical History:  Procedure Laterality Date  . TRACHEOSTOMY TUBE PLACEMENT N/A 06/11/2016   Procedure: TRACHEOSTOMY;  Surgeon: Melida Quitter, MD;  Location: [facility];  Service: ENT;  Laterality: N/A;    Assessment / Plan / Recommendation Clinical Impression   [name] [name] is a 35 y.o. right handed female who presented 05/25/2016 with progressive dyspnea on exertion and orthopnea with lower extremity abdominal wall edema. Oxygen saturations in the 70s. Blood pressure elevated  215/151. Chest x-ray in the ED consistent with CHF. Required intubation for respiratory failure 05/28/2016 per critical care medicine and later tracheostomy tube required completed 06/11/2016 per Dr. Redmond Baseman.. Patient  ATC throughout the day with capping trials and currently with a #6 proximal XLT trach.NO CURRENT PLAN FOR DECANNULATION.  Diet advanced to dysphagia #1 honey thick liquid. Patient was admitted for comprehensive rehabilitation program 06/29/2016.  SLP evaluation was completed on 06/30/2016 with the following results: Pt presents with excellent toleration of speaking valve and was able to tolerate wearing it throughout today's evaluation without changes in vital signs or s/s of distress.  Pt able to redirect air through upper airway but remains primarily aphonic with only brief periods of breathy vocalization noted  with moderate effort during structured voicing tasks.  Voicing in addition to decreased breath support for speech impact her intelligibility in conversations.    Pt also  demonstrates good toleration of current diet and exhibited no overt s/s of aspiration with dys 1 purees or honey thick liquids.  Pt had mild delayed throat clearing with trials of ice chips as well as thin  liquids via teaspoon and cup sips which appears consistent with most recent objective study.   Given the abovementioned deficits, pt would benefit from skilled ST while inpatient in order to maximize functional independence for speech and swallowing function.  Pt would also benefit from ENT follow up as an outpatient if speech and swallowing function are slow to improve given findings of chink on posterior aspect of vocal folds with granulomatous tissue.    Skilled Therapeutic Interventions          Bedside swallow and motor speech evaluation completed with results and recommendations reviewed with patient.  Therapist reviewed and reinforced results of most recent FEES and rationale behind currently prescribed diet.  SLP also introduced pharyngeal strengthening exercises to continue working towards repeat objective study.  Pt was able to return demonstration of Masako, Mendelsohn, and effortful swallow for 5 repetitions each.  Pt instructed to complete 25 repetitions by next therapy session.  Pt verbalized understanding.       SLP Assessment  Patient will need skilled Speech Lanaguage Pathology Services during CIR admission    Recommendations  Patient may use Passy-Muir Speech Valve: During all waking hours (remove during sleep) PMSV Supervision: Intermittent Recommended Consults: Consider ENT evaluation SLP Diet Recommendations: Dysphagia 1 (Puree);Honey Liquid Administration via: Cup Medication Administration: Crushed with puree Supervision: Patient able to self feed;Intermittent supervision to cue for compensatory strategies Compensations: Slow rate;Small sips/bites;Clear throat intermittently Postural Changes and/or Swallow Maneuvers: Out of bed for meals Oral Care Recommendations: Oral care BID Patient destination: Home Follow up Recommendations: Home Health SLP Equipment Recommended: To be determined    SLP Frequency 3 to 5 out of 7 days   SLP Duration  SLP Intensity  SLP  Treatment/Interventions 7 days   Minumum of 1-2 x/day, 30 to 90 minutes  Cueing hierarchy;Dysphagia/aspiration precaution training;Environmental controls;Patient/family education;Internal/external aids    Pain Pain Assessment Pain Assessment: No/denies pain  Prior Functioning Cognitive/Linguistic Baseline: Within functional limits Type of Home: House  Lives With: Family Available Help at Discharge: Family;Available PRN/intermittently Vocation: Full time employment  Function:  Eating Eating   Modified Consistency Diet: Yes Eating Assist Level: Swallowing techniques: self managed           Cognition Comprehension Comprehension assist level: Understands complex 90% of the time/cues 10% of the time  Expression   Expression assist level: Expresses basic 75 - 89% of the time/requires cueing 10 - 24% of the time. Needs helper to occlude trach/needs to repeat words.  Social Interaction Social Interaction assist level: Interacts appropriately with others with medication or extra time (anti-anxiety, antidepressant).  Problem Solving Problem solving assist level: Solves basic problems with no assist  Memory Memory assist level: More than reasonable amount of time   Short Term Goals: Week 1: SLP Short Term Goal 1 (Week 1): STG=LTG due to ELOS   Refer to Care Plan for Long Term Goals  Recommendations for other services: None  Discharge Criteria: Patient will be discharged from SLP if patient refuses treatment 3 consecutive times without medical reason, if treatment goals not met, if there is a change in medical status, if patient makes no progress towards goals or if patient is discharged from hospital.  The above assessment, treatment plan, treatment alternatives and goals were discussed and mutually agreed  upon: by patient  Emilio Math 06/30/2016, 4:00 PM

## 2016-06-30 NOTE — IPOC Note (Signed)
Overall Plan of Care [facility]) Patient Details Name: [name] [name] MRN: [id number] DOB: 04/03/1981  Admitting Diagnosis: debility  chf  Hospital Problems: Principal Problem:   Debilitated Active Problems:   Morbid obesity ([place])   Tracheostomy status (HCC)   Acute diastolic CHF (congestive heart failure) (HCC)   Uncontrolled type 2 diabetes mellitus with complication (HCC)   Benign essential HTN   AKI (acute kidney injury) ([place])   Hypoalbuminemia due to protein-calorie malnutrition ([place])     Functional Problem List: Nursing Safety, Skin Integrity, Endurance, Medication Management, Nutrition, Bladder  PT Balance, Edema, Endurance, Motor, Nutrition, Safety, Skin Integrity  OT Balance, Endurance, Safety, Skin Integrity, Motor  SLP Linguistic, Nutrition  TR         Basic ADL's: OT Bathing, Dressing, Toileting, Grooming     Advanced  ADL's: OT Simple Meal Preparation, Light Housekeeping     Transfers: PT Bed Mobility, Bed to Chair, Car, Manufacturing systems engineer, Metallurgist: PT Ambulation, Emergency planning/management officer, Stairs     Additional Impairments: OT None  SLP Communication, Swallowing expression    TR      Anticipated Outcomes Item Anticipated Outcome  Self Feeding    Swallowing  mod I    Basic self-care  Mod I  Toileting  Mod I   Bathroom Transfers Mod I  Bowel/Bladder  Continent bowel and bladder; no retention, no s/s infection.  Transfers  mod I  Locomotion  mod I household Paramedic  Cognition     Pain  Managed at goal 2/10  Safety/Judgment  Increased safety awareness   Therapy Plan: PT Intensity: Minimum of 1-2 x/day ,45 to 90 minutes PT Frequency: 5 out of 7 days PT Duration Estimated Length of Stay: 7-9 days OT Intensity: Minimum of 1-2 x/day, 45 to 90 minutes OT Frequency: 5 out of 7 days OT Duration/Estimated Length of Stay: 5-7 days SLP Intensity: Minumum of 1-2 x/day, 30 to 90 minutes SLP  Frequency: 3 to 5 out of 7 days SLP Duration/Estimated Length of Stay: 7 days        Team Interventions: Nursing Interventions Patient/Family Education, Bladder Management, Disease Management/Prevention, Medication Management, Skin Care/Wound Management, Discharge Planning, Psychosocial Support  PT interventions Ambulation/gait training, Training and development officer, Community reintegration, Discharge planning, Disease management/prevention, DME/adaptive equipment instruction, Functional mobility training, Neuromuscular re-education, Pain management, Patient/family education, Psychosocial support, Stair training, UE/LE Strength taining/ROM, Therapeutic Activities, Therapeutic Exercise, UE/LE Coordination activities, Wheelchair propulsion/positioning  OT Interventions Community reintegration, Discharge planning, DME/adaptive equipment instruction, Functional mobility training, Patient/family education, Self Care/advanced ADL retraining, Therapeutic Activities, Therapeutic Exercise, UE/LE Strength taining/ROM, UE/LE Coordination activities  SLP Interventions Cueing hierarchy, Dysphagia/aspiration precaution training, Environmental controls, Patient/family education, Internal/external aids  TR Interventions    SW/CM Interventions Discharge Planning, Psychosocial Support, Patient/Family Education    Team Discharge Planning: Destination: PT-Home ,OT- Home , SLP-Home Projected Follow-up: PT-Home health PT, OT-  Home health OT, SLP-Home Health SLP Projected Equipment Needs: PT-Rolling walker with 5" wheels, OT- To be determined, SLP-To be determined Equipment Details: PT- , OT-  Patient/family involved in discharge planning: PT- Patient,  OT-Patient, SLP-Patient  MD ELOS: 7-10 days. Medical Rehab Prognosis:  Good Assessment: 35 y.o.right handed femalewith history of uncontrolled hypertension, morbid obesity and medical noncompliance. Per chart review independent prior to admission she has a 59-year-old  daughter. Presented 05/25/2016 with progressive dyspnea on exertion and orthopnea with lower extremity abdominal wall edema. Oxygen saturations in the 70s. Blood pressure elevated 215/151. Chest x-ray in  the ED consistent with CHF. EKG showed sinus tachycardia but no acute ST elevations. CTA of chest negative for pulmonary emboli. Mildly elevated troponin 0.04 felt to be related to heart strain CHF. BNP elevated at 600. Placed on intravenous Lasix and has been diuresed over 100 pounds since admission. Wound care consulted for bilateral lower extremity edema and weeping of wounds. Echocardiogram with ejection fraction of 54% normal systolic function. Required intubation for respiratory failure 05/28/2016 per critical care medicine and later tracheostomy tube required completed 06/11/2016 per Dr. Redmond Baseman.. Patient  ATCthroughout the day with capping trials and currently with a #6 proximal XLT trach.NO CURRENT PLAN FOR DECANNULATION. Initially with Nasogastric tube for nutritional support and Diet advanced to dysphagia #1 honey thick liquid and tube removed. Pt with functional deficits with weakness, endurance, gait stability, swallowing.  Will set goals for Mod I for most tasks PT/OT/SLP.   See Team Conference Notes for weekly updates to the plan of care

## 2016-06-30 NOTE — Evaluation (Signed)
Physical Therapy Assessment and Plan  Patient Details  Name: [name] [name] MRN: [id number] Date of Birth: [birth year]  PT Diagnosis: Difficulty walking and Muscle weakness Rehab Potential: Good ELOS: 7-9 days   Today's Date: 06/30/2016 PT Individual Time: 0930-1100 PT Individual Time Calculation (min): 90 min     Problem List: Patient Active Problem List   Diagnosis Date Noted  . Benign essential HTN   . AKI (acute kidney injury) ([place])   . Hypoalbuminemia due to protein-calorie malnutrition ([place])   . Debilitated 06/29/2016  . Demand ischemia ([place])   . Acute on chronic respiratory failure with hypoxia ([place])   . Moderate single current episode of major depressive disorder ([place])   . Uncontrolled type 2 diabetes mellitus with complication ([place])   . Tracheostomy status ([place])   . Acute diastolic CHF (congestive heart failure) ([place])   . Hypertensive urgency   . Obesity hypoventilation syndrome ([place])   . Acute kidney injury ([place])   . Acute on chronic respiratory failure with hypoxia and hypercapnia (HCC)   . Acute respiratory failure with hypoxia ([place])   . HCAP (healthcare-associated pneumonia)   . Sinus pause 06/07/2016  . Difficult intravenous access   . Acute respiratory failure with hypoxemia ([place])   . Hepatic congestion 05/26/2016  . Sinus tachycardia 05/26/2016  . Pericardial effusion 05/26/2016  . Pulmonary hypertension   . Renal insufficiency 05/25/2016  . Abnormal EKG 05/25/2016  . Acute pulmonary edema ([place]) 05/25/2016  . Abdominal pain 05/25/2016  . CHF (congestive heart failure) ([place]) 05/25/2016  . Acute congestive heart failure ([place]) 05/25/2016  . Oligohydramnios, delivered 05/27/2013  . NSVD (normal spontaneous vaginal delivery) 05/27/2013  . Pregnancy 11/02/2012  . OSA (obstructive sleep apnea) 07/09/2011  . 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 Morbid obesity ([place]) 05/18/2011    Past Medical History:  Past Medical History:  Diagnosis Date  . Diabetes  mellitus without complication (HCC)    GDM  . Gestational diabetes mellitus in pregnancy 05/24/2013  . HTN in pregnancy, chronic 05/24/2013  . Hypertension   . Morbid obesity ([place])   . Sinus tachycardia    Past Surgical History:  Past Surgical History:  Procedure Laterality Date  . TRACHEOSTOMY TUBE PLACEMENT N/A 06/11/2016   Procedure: TRACHEOSTOMY;  Surgeon: Melida Quitter, MD;  Location: [facility];  Service: ENT;  Laterality: N/A;    Assessment & Plan Clinical Impression: [name] [name] a 35 y.o.right handed femalewith history of uncontrolled hypertension, morbid obesity 337 pounds and medical noncompliance. Per chart review independent prior to admission she has a 37-year-old daughter. Presented 05/25/2016 with progressive dyspnea on exertion and orthopnea with lower extremity abdominal wall edema. Oxygen saturations in the 70s. Blood pressure elevated 215/151. Chest x-ray in the ED consistent with CHF. EKG showed sinus tachycardia but no acute ST elevations. CTA of chest negative for pulmonary emboli. Mildly elevated troponin 0.04 felt to be related to heart strain CHF. BNP elevated at 600. Placed on intravenous Lasix and has been diuresed over 100 pounds since admission. Wound care consulted for bilateral lower extremity edema and weeping of wounds. Echocardiogram with ejection fraction of 13% normal systolic function. Required intubation for respiratory failure 05/28/2016 per critical care medicine and later tracheostomy tube required completed 06/11/2016 per Dr. Redmond Baseman. Patient  ATCthroughout the day with capping trials and currently with a #6 proximal XLT trach. NO CURRENT PLAN FOR DECANNULATION. Subcutaneous Lovenox for DVT prophylaxis.Initially with Nasogastric tube for nutritional support and Diet advanced to dysphagia #1 honey thick liquid and tube removed.  Physical and occupational therapy evaluations completed and ongoing with slow progressive gains.  Patient transferred to CIR on  06/29/2016.   Patient currently requires min with mobility secondary to muscle weakness, decreased cardiorespiratoy endurance and decreased oxygen support and decreased standing balance and decreased balance strategies.  Prior to hospitalization, patient was independent  with mobility and lived with Family in a House home.  Home access is 5Stairs to enter.  Patient will benefit from skilled PT intervention to maximize safe functional mobility, minimize fall risk and decrease caregiver burden for planned discharge home with intermittent assist.  Anticipate patient will benefit from follow up [facility] at discharge.  PT - End of Session Activity Tolerance: Tolerates < 10 min activity with changes in vital signs;Decreased this session Endurance Deficit: Yes Endurance Deficit Description: patient on room air desaturating to 63' but recovering quickly with rest breaks PT Assessment Rehab Potential (ACUTE/IP ONLY): Good Barriers to Discharge: Decreased caregiver support PT Patient demonstrates impairments in the following area(s): Balance;Edema;Endurance;Motor;Nutrition;Safety;Skin Integrity PT Transfers Functional Problem(s): Bed Mobility;Bed to Chair;Car;Furniture PT Locomotion Functional Problem(s): Ambulation;Wheelchair Mobility;Stairs PT Plan PT Intensity: Minimum of 1-2 x/day ,45 to 90 minutes PT Frequency: 5 out of 7 days PT Duration Estimated Length of Stay: 7-9 days PT Treatment/Interventions: Ambulation/gait training;Balance/vestibular training;Community reintegration;Discharge planning;Disease management/prevention;DME/adaptive equipment instruction;Functional mobility training;Neuromuscular re-education;Pain management;Patient/family education;Psychosocial support;Stair training;UE/LE Strength taining/ROM;Therapeutic Activities;Therapeutic Exercise;UE/LE Coordination activities;Wheelchair propulsion/positioning PT Transfers Anticipated Outcome(s): mod I PT Locomotion Anticipated Outcome(s): mod I  household ambulator PT Recommendation Follow Up Recommendations: Home health PT Patient destination: Home Equipment Recommended: Rolling walker with 5" wheels  Skilled Therapeutic Intervention Skilled therapeutic intervention initiated after completion of evaluation. Discussed with patient falls risk, safety within room, and focus of therapy during stay. Discussed possible length of stay, goals, and follow-up therapy. Patient currently requires supervision to min A for sit > stand from low surfaces using bariatric RW up to 120 ft with decreased activity tolerance. Vitals monitored with activity with Sp02 dropping to low 80's on room air but increased quickly >88% after seated rest. Patient provided with 24 x 18 wheelchair with elevating leg rests for improved sitting tolerance. Patient requested to sit in wide arm chair in room and left with all needs within reach.   PT Evaluation Precautions/Restrictions Precautions Precautions: Fall Precaution Comments: trach collar with PSMV Restrictions Weight Bearing Restrictions: No General Chart Reviewed: Yes Family/Caregiver Present: No  Pain Pain Assessment Pain Assessment: No/denies pain Home Living/Prior Functioning Home Living Available Help at Discharge: Family;Available PRN/intermittently (mom works) Type of Home: House Home Access: Stairs to enter Technical brewer of Steps: 5 Entrance Stairs-Rails: Right;Left Home Layout: One level Bathroom Shower/Tub: Optometrist:  (unsure) Additional Comments: unsure if bariatric RW will fit in bathroom  Lives With: Family Prior Function Level of Independence: Independent with basic ADLs;Independent with transfers;Independent with gait  Able to Take Stairs?: Yes (shortness of breath) Driving: Yes Vocation: Full time employment 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 Leisure: Hobbies-yes  (Comment) Comments: take care of daughter Vision/Perception   Wears glasses at all times, no change from baseline  Cognition Overall Cognitive Status: Within Functional Limits for tasks assessed Arousal/Alertness: Awake/alert Orientation Level: Oriented X4 Memory: Appears intact Sensation Sensation Light Touch: Appears Intact Stereognosis: Appears Intact Hot/Cold: Appears Intact Proprioception: Appears Intact Coordination Gross Motor Movements are Fluid and Coordinated: Yes Fine Motor Movements are Fluid and Coordinated: Yes Motor  Motor Motor: Within Functional Limits  Mobility Bed Mobility Bed Mobility:  Supine to Sit;Sit to Supine Supine to Sit: 5: Supervision;HOB flat Sit to Supine: 5: Supervision;HOB flat Transfers Transfers: Yes Stand Pivot Transfers: 4: Min assist;With armrests Locomotion  Ambulation Ambulation: Yes Ambulation/Gait Assistance: 5: Supervision Ambulation Distance (Feet): 120 Feet Assistive device: Rolling walker Gait Gait: Yes Gait Pattern: Impaired Gait Pattern: Step-through pattern;Decreased stride length;Trunk flexed Gait velocity: decreased Stairs / Additional Locomotion Stairs: Yes Stairs Assistance: 4: Min guard Stair Management Technique: Two rails;Alternating pattern Number of Stairs: 8 Height of Stairs: 3 Ramp: 5: Supervision Wheelchair Mobility Wheelchair Mobility: No  Trunk/Postural Assessment  Cervical Assessment Cervical Assessment: Exceptions to [facility] (forward head) Thoracic Assessment Thoracic Assessment: Exceptions to [facility] (rounded shoulders) Lumbar Assessment Lumbar Assessment: Exceptions to [facility] (posterior pelvic tilt) Postural Control Postural Control: Within Functional Limits  Balance Balance Balance Assessed: Yes Static Standing Balance Static Standing - Balance Support: During functional activity Static Standing - Level of Assistance: 5: Stand by assistance Dynamic Standing Balance Dynamic Standing - Balance  Support: During functional activity Dynamic Standing - Level of Assistance: 4: Min assist Extremity Assessment  RUE Assessment RUE Assessment: Within Functional Limits LUE Assessment LUE Assessment: Within Functional Limits RLE Assessment RLE Assessment: Within Functional Limits LLE Assessment LLE Assessment: Within Functional Limits  See Function Navigator for Current Functional Status.   Refer to Care Plan for Long Term Goals  Recommendations for other services: None  Discharge Criteria: Patient will be discharged from PT if patient refuses treatment 3 consecutive times without medical reason, if treatment goals not met, if there is a change in medical status, if patient makes no progress towards goals or if patient is discharged from hospital.  The above assessment, treatment plan, treatment alternatives and goals were discussed and mutually agreed upon: by patient  Laretta Alstrom 06/30/2016, 12:21 PM

## 2016-06-30 NOTE — Progress Notes (Signed)
At time trach check - PT states she is breathing well and was in the bathroom on RA (Sp02 97%).

## 2016-06-30 NOTE — Evaluation (Signed)
Occupational Therapy Assessment and Plan  Patient Details  Name: [name] [name] MRN: [id number] Date of Birth: 08-22-1981  OT Diagnosis: muscle weakness (generalized) Rehab Potential: Rehab Potential (ACUTE ONLY): Excellent ELOS: 5-7 days   Today's Date: 06/30/2016 OT Individual Time: 0800-0905 OT Individual Time Calculation (min): 65 min      Problem List:  Patient Active Problem List   Diagnosis Date Noted  . Benign essential HTN   . AKI (acute kidney injury) ([place])   . Hypoalbuminemia due to protein-calorie malnutrition ([place])   . Debilitated 06/29/2016  . Demand ischemia ([place])   . Acute on chronic respiratory failure with hypoxia ([place])   . Moderate single current episode of major depressive disorder ([place])   . Uncontrolled type 2 diabetes mellitus with complication ([place])   . Tracheostomy status ([place])   . Acute diastolic CHF (congestive heart failure) ([place])   . Hypertensive urgency   . Obesity hypoventilation syndrome ([place])   . Acute kidney injury ([place])   . Acute on chronic respiratory failure with hypoxia and hypercapnia (HCC)   . Acute respiratory failure with hypoxia ([place])   . HCAP (healthcare-associated pneumonia)   . Sinus pause 06/07/2016  . Difficult intravenous access   . Acute respiratory failure with hypoxemia ([place])   . Hepatic congestion 05/26/2016  . Sinus tachycardia 05/26/2016  . Pericardial effusion 05/26/2016  . Pulmonary hypertension   . Renal insufficiency 05/25/2016  . Abnormal EKG 05/25/2016  . Acute pulmonary edema ([place]) 05/25/2016  . Abdominal pain 05/25/2016  . CHF (congestive heart failure) ([place]) 05/25/2016  . Acute congestive heart failure ([place]) 05/25/2016  . Oligohydramnios, delivered 05/27/2013  . NSVD (normal spontaneous vaginal delivery) 05/27/2013  . Pregnancy 11/02/2012  . OSA (obstructive sleep apnea) 07/09/2011  . 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 Morbid obesity ([place]) 05/18/2011    Past Medical History:  Past Medical History:   Diagnosis Date  . Diabetes mellitus without complication (HCC)    GDM  . Gestational diabetes mellitus in pregnancy 05/24/2013  . HTN in pregnancy, chronic 05/24/2013  . Hypertension   . Morbid obesity ([place])   . Sinus tachycardia    Past Surgical History:  Past Surgical History:  Procedure Laterality Date  . TRACHEOSTOMY TUBE PLACEMENT N/A 06/11/2016   Procedure: TRACHEOSTOMY;  Surgeon: Melida Quitter, MD;  Location: [facility];  Service: ENT;  Laterality: N/A;    Assessment & Plan Clinical Impression: Patient is a 35 y.o. year old female with recent admission to the hospital on 05/25/2016 with progressive dyspnea on exertion and orthopnea with lower extremity abdominal wall edema. Oxygen saturations in the 70s. Blood pressure elevated  215/151. Chest x-ray in the ED consistent with CHF. EKG showed sinus tachycardia but no acute ST elevations. CTA of chest negative for pulmonary emboli. Mildly elevated troponin 0.04 felt to be related to heart strain CHF. BNP elevated at 600. Placed on intravenous Lasix and has been diuresed over 100 pounds since admission. Wound care consulted for bilateral lower extremity edema and weeping of wounds. Echocardiogram with ejection fraction of 66% normal systolic function. Required intubation for respiratory failure 05/28/2016 per critical care medicine and later tracheostomy tube required completed 06/11/2016 per Dr. Redmond Baseman.. Patient  ATC throughout the day with capping trials and currently with a #6 proximal XLT trach.NO CURRENT PLAN FOR DECANNULATION. Subcutaneous Lovenox for DVT prophylaxis. Diet advanced to dysphagia #1 honey thick liquid. Physical and occupational therapy evaluations completed and ongoing with slow progressive gains.   Patient transferred to CIR on 06/29/2016 .  Patient currently requires max with basic self-care skills secondary to muscle weakness, decreased cardiorespiratoy endurance and decreased oxygen support and decreased standing balance and  decreased postural control.  Prior to hospitalization, patient could complete ADL, and IADL with independent .  Patient will benefit from skilled intervention to increase independence with basic self-care skills prior to discharge home with care partner.  Anticipate patient will require intermittent assistance for some IADL tasks and follow up home health.  OT - End of Session Endurance Deficit: Yes Endurance Deficit Description: patient on room air desaturating to 18' but recovering quickly with rest breaks OT Assessment Rehab Potential (ACUTE ONLY): Excellent OT Patient demonstrates impairments in the following area(s): Balance;Endurance;Safety;Skin Integrity;Motor OT Basic ADL's Functional Problem(s): Bathing;Dressing;Toileting;Grooming OT Advanced ADL's Functional Problem(s): Simple Meal Preparation;Light Housekeeping OT Transfers Functional Problem(s): Toilet;Tub/Shower OT Additional Impairment(s): None OT Plan OT Intensity: Minimum of 1-2 x/day, 45 to 90 minutes OT Frequency: 5 out of 7 days OT Duration/Estimated Length of Stay: 5-7 days OT Treatment/Interventions: [employer] reintegration;Discharge planning;DME/adaptive equipment instruction;Functional mobility training;Patient/family education;Self Care/advanced ADL retraining;Therapeutic Activities;Therapeutic Exercise;UE/LE Strength taining/ROM;UE/LE Coordination activities OT Basic Self-Care Anticipated Outcome(s): Mod I OT Toileting Anticipated Outcome(s): Mod I OT Bathroom Transfers Anticipated Outcome(s): Mod I OT Recommendation Patient destination: Home Follow Up Recommendations: Home health OT Equipment Recommended: To be determined   Skilled Therapeutic Intervention initial eval completed with treatment provided to address functional transfers,  improved sit<>stand, standing tolerance, and adapted bathing/dressing skills. Pt in bathroom upon OT arrival, dependent toileting, Max A sit<>stand from toilet. Pt completed  bathing/dressing with Max A for LB self care. Pt desat to 86 during standing ADL, recovered quickly to >90 - no supplemental oxygen, Trach collar w/ PMV on.    OT Evaluation Precautions/Restrictions  Precautions Precautions: Fall Precaution Comments: trach collar with PSMV Restrictions Weight Bearing Restrictions: No Vital Signs Oxygen Therapy SpO2: (!) 86 % O2 Device: Tracheostomy Collar;Other (Comment) (PMV on) Pulse Oximetry Type: Intermittent Pain Pain Assessment Pain Assessment: No/denies pain Home Living/Prior Functioning Home Living Available Help at Discharge: Family, Available PRN/intermittently (mom works) Type of Home: House Home Access: Stairs to enter Technical brewer of Steps: 5 Entrance Stairs-Rails: Right, Left Home Layout: One level Bathroom Shower/Tub: Optometrist:  (unsure) Additional Comments: unsure if bariatric RW will fit in bathroom  Lives With: Family IADL History Current License: Yes Occupation: Full time employment 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, Independent with transfers, Independent with gait  Able to Take Stairs?: Yes (shortness of breath) Driving: Yes Vocation: Full time employment Vocation Requirements: Counsellor  Leisure: Hobbies-yes (Comment) Comments: take care of daughter ADL ADL ADL Comments: See functional Navigator Vision/Perception  Vision- History Baseline Vision/History: Wears glasses Wears Glasses: At all times Patient Visual Report: No change from baseline  Cognition Overall Cognitive Status: Within Functional Limits for tasks assessed Arousal/Alertness: Awake/alert Orientation Level: Person;Place;Situation Person: Oriented Place: Oriented Situation: Oriented Year: 2017 Month: November Day of Week: Correct Memory: Appears intact Immediate Memory Recall:  Sock;Blue;Bed Memory Recall: Sock;Blue;Bed Memory Recall Sock: With Cue Memory Recall Blue: Without Cue Memory Recall Bed: Without Cue Sensation Sensation Light Touch: Appears Intact Stereognosis: Appears Intact Hot/Cold: Appears Intact Proprioception: Appears Intact Coordination Gross Motor Movements are Fluid and Coordinated: Yes Fine Motor Movements are Fluid and Coordinated: Yes Motor  Motor Motor: Within Functional Limits Mobility  Bed Mobility Bed Mobility: Supine to Sit;Sit to Supine Supine to Sit: 5: Supervision;HOB flat  Sit to Supine: 5: Supervision;HOB flat  Trunk/Postural Assessment  Cervical Assessment Cervical Assessment: Exceptions to [facility] (forward head) Thoracic Assessment Thoracic Assessment: Exceptions to [facility] (rounded shoulders) Lumbar Assessment Lumbar Assessment: Exceptions to [facility] (posterior pelvic tilt) Postural Control Postural Control: Within Functional Limits  Balance Balance Balance Assessed: Yes Static Standing Balance Static Standing - Balance Support: During functional activity Static Standing - Level of Assistance: 5: Stand by assistance Dynamic Standing Balance Dynamic Standing - Balance Support: During functional activity Dynamic Standing - Level of Assistance: 4: Min assist Extremity/Trunk Assessment RUE Assessment RUE Assessment: Within Functional Limits LUE Assessment LUE Assessment: Within Functional Limits   See Function Navigator for Current Functional Status.   Refer to Care Plan for Long Term Goals  Recommendations for other services: None  Discharge Criteria: Patient will be discharged from OT if patient refuses treatment 3 consecutive times without medical reason, if treatment goals not met, if there is a change in medical status, if patient makes no progress towards goals or if patient is discharged from hospital.  The above assessment, treatment plan, treatment alternatives and goals were discussed and mutually agreed  upon: by patient  Valma Cava 06/30/2016, 1:05 PM

## 2016-06-30 NOTE — Progress Notes (Signed)
RT placed PT on new ATC set up (28% at 6 lpm).

## 2016-06-30 NOTE — Patient Care Conference (Signed)
Inpatient RehabilitationTeam Conference and Plan of Care Update Date: 06/30/2016   Time: 2:45 PM    Patient Name: [name] [name]      Medical Record Number: [id number]  Date of Birth: [birth year] Sex: Female         Room/Bed: 4W14C/4W14C-01 Payor Info: Payor: MEDCOST / Plan: MEDCOST / Product Type: *No Product type* /    Admitting Diagnosis: debility  chf  Admit Date/Time:  06/29/2016  3:09 PM Admission Comments: No comment available   Primary Diagnosis:  Debilitated Principal Problem: Debilitated  Patient Active Problem List   Diagnosis Date Noted  . Benign essential HTN   . AKI (acute kidney injury) ([place])   . Hypoalbuminemia due to protein-calorie malnutrition ([place])   . Debilitated 06/29/2016  . Demand ischemia ([place])   . Acute on chronic respiratory failure with hypoxia ([place])   . Moderate single current episode of major depressive disorder ([place])   . Uncontrolled type 2 diabetes mellitus with complication ([place])   . Tracheostomy status ([place])   . Acute diastolic CHF (congestive heart failure) ([place])   . Hypertensive urgency   . Obesity hypoventilation syndrome ([place])   . Acute kidney injury ([place])   . Acute on chronic respiratory failure with hypoxia and hypercapnia (HCC)   . Acute respiratory failure with hypoxia ([place])   . HCAP (healthcare-associated pneumonia)   . Sinus pause 06/07/2016  . Difficult intravenous access   . Acute respiratory failure with hypoxemia ([place])   . Hepatic congestion 05/26/2016  . Sinus tachycardia 05/26/2016  . Pericardial effusion 05/26/2016  . Pulmonary hypertension   . Renal insufficiency 05/25/2016  . Abnormal EKG 05/25/2016  . Acute pulmonary edema ([place]) 05/25/2016  . Abdominal pain 05/25/2016  . CHF (congestive heart failure) ([place]) 05/25/2016  . Acute congestive heart failure ([place]) 05/25/2016  . Oligohydramnios, delivered 05/27/2013  . NSVD (normal spontaneous vaginal delivery) 05/27/2013  . Pregnancy 11/02/2012  . OSA (obstructive sleep  apnea) 07/09/2011  . 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 Morbid obesity ([place]) 05/18/2011    Expected Discharge Date: Expected Discharge Date: 07/06/16  Team Members Present: Physician leading conference: Dr. Delice Lesch Social Worker Present: Ovidio Kin, LCSW Nurse Present: Heather Roberts, RN PT Present: Jorge Mandril, PT OT Present: Benay Pillow, OT SLP Present: Windell Moulding, SLP PPS Coordinator present : Daiva Nakayama, RN, CRRN     Current Status/Progress Goal Weekly Team Focus  Medical   Debilitation secondary to CHF/respiratory failure  Imporve mobility, speech, dysphagia  See above   Bowel/Bladder   foley catheter on admission, bm 11/7 prior to transfer, leakage of smears of liquid stool with coughing several times on night shift  continent of B&B      Swallow/Nutrition/ Hydration   Dys 1, honey thick liquids  mod I   trials of advanced textures to work towards repeat objective assessment   ADL's   Overall Mod/Max A Bathing/dressing/toilleting  Mod I ADLs and simple IADLs  activity tolerance, ADL modifications, standing balance, general strengthening, pt/family education   Mobility   min A  mod I household ambulator except supervision stairs and car transfer  functional mobility, standing balance, activity tolerance, generalized strengthening, pt education   Communication   severely impaired vocal quality, aphonic with only intermittent periods of voicing  supervision   respiratory muscle strength training, intelligibility strategies, education for vocal hygiene    Safety/Cognition/ Behavioral Observations  WFL         Pain   no c/o of pain  pain score=0  assessment of pain at least q shift   Skin   skin breakdown on Left buttock (sheer), L heel scab, left calf pt states scab circling posterior calf from when she was admitted had LE edema with weeping  maintain skin integrity, healing of previous skin breakdown  need orders as pt admit at 1730 on 11/7.      *See Care  Plan and progress notes for long and short-term goals.  Barriers to Discharge: Morbid obesity, OHS, trach, AKI, poor activity tolerance    Possible Resolutions to Barriers:  Therapies, cont trach, follow labs    Discharge Planning/Teaching Needs:    Home to Mom's at discharge and she and Godmother will be checking on during the day while Mom works. Needs to be mod/i level     Team Discussion:  Goals-mod/i level. Diet dys 1 honey thick-hopefully can do fees soon and advance diet. Endurance is a big issue and desating in therapies. Speech to start respiratory muscle training.   Revisions to Treatment Plan:  New eval   Continued Need for Acute Rehabilitation Level of Care: The patient requires daily medical management by a physician with specialized training in physical medicine and rehabilitation for the following conditions: Daily direction of a multidisciplinary physical rehabilitation program to ensure safe treatment while eliciting the highest outcome that is of practical value to the patient.: Yes Daily medical management of patient stability for increased activity during participation in an intensive rehabilitation regime.: Yes Daily analysis of laboratory values and/or radiology reports with any subsequent need for medication adjustment of medical intervention for : Pulmonary problems;Post surgical problems;Diabetes problems;Renal problems  Elease Hashimoto 06/30/2016, 4:48 PM

## 2016-06-30 NOTE — Progress Notes (Signed)
Social Work Assessment and Plan Social Work Assessment and Plan  Patient Details  Name: [name] [name] MRN: [id number] Date of Birth: [birth year]  Today's Date: 06/30/2016  Problem List:  Patient Active Problem List   Diagnosis Date Noted  . Benign essential HTN   . AKI (acute kidney injury) ([place])   . Hypoalbuminemia due to protein-calorie malnutrition ([place])   . Debilitated 06/29/2016  . Demand ischemia ([place])   . Acute on chronic respiratory failure with hypoxia ([place])   . Moderate single current episode of major depressive disorder ([place])   . Uncontrolled type 2 diabetes mellitus with complication ([place])   . Tracheostomy status ([place])   . Acute diastolic CHF (congestive heart failure) ([place])   . Hypertensive urgency   . Obesity hypoventilation syndrome ([place])   . Acute kidney injury ([place])   . Acute on chronic respiratory failure with hypoxia and hypercapnia (HCC)   . Acute respiratory failure with hypoxia ([place])   . HCAP (healthcare-associated pneumonia)   . Sinus pause 06/07/2016  . Difficult intravenous access   . Acute respiratory failure with hypoxemia ([place])   . Hepatic congestion 05/26/2016  . Sinus tachycardia 05/26/2016  . Pericardial effusion 05/26/2016  . Pulmonary hypertension   . Renal insufficiency 05/25/2016  . Abnormal EKG 05/25/2016  . Acute pulmonary edema ([place]) 05/25/2016  . Abdominal pain 05/25/2016  . CHF (congestive heart failure) ([place]) 05/25/2016  . Acute congestive heart failure ([place]) 05/25/2016  . Oligohydramnios, delivered 05/27/2013  . NSVD (normal spontaneous vaginal delivery) 05/27/2013  . Pregnancy 11/02/2012  . OSA (obstructive sleep apnea) 07/09/2011  . 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 Morbid obesity ([place]) 05/18/2011   Past Medical History:  Past Medical History:  Diagnosis Date  . Diabetes mellitus without complication (HCC)    GDM  . Gestational diabetes mellitus in pregnancy 05/24/2013  . HTN in pregnancy, chronic 05/24/2013  .  Hypertension   . Morbid obesity ([place])   . Sinus tachycardia    Past Surgical History:  Past Surgical History:  Procedure Laterality Date  . TRACHEOSTOMY TUBE PLACEMENT N/A 06/11/2016   Procedure: TRACHEOSTOMY;  Surgeon: Melida Quitter, MD;  Location: [facility];  Service: ENT;  Laterality: N/A;   Social History:  reports that she has never smoked. She has never used smokeless tobacco. She reports that she does not drink alcohol or use drugs.  Family / Support Systems Marital Status: Single Patient Roles: Parent, Other (Comment) (employee) Children: 51 yo daughter Other Supports: Sanda Linger  [phone]  Levie Owensby  [phone] Anticipated Caregiver: Mom and family members Ability/Limitations of Caregiver: Mom works but can check on her during the day Caregiver Availability: Evenings only Family Dynamics: Close with her Mom and Elenor Legato, she would stay with Aunt during the week due to local-[place] and then go to Moms' on the weekends. She lives in [place]. Her daughter is currently with her Mom.   Social History Preferred language: English Religion: Christian Cultural Background: no issues Education: Surveyor, [employer] college Read: Yes Write: Yes Employment Status: Employed Name of Employer: Dietitian Return to Work Plans: would like to but depends upon her recovery from this Freight forwarder Issues: No issues Guardian/Conservator: None-according to MD is capable of making her own decisions while here   Abuse/Neglect Physical Abuse: Denies Verbal Abuse: Denies Sexual Abuse: Denies Exploitation of patient/patient's resources: Denies Self-Neglect: Denies  Emotional Status Pt's affect, behavior adn adjustment status: Pt is motivated to improve and become independent again. She feels this is an achievable goal  and wants to get home to her daughter. She realizes she wil need to change her life habits to be healthier. She plans to do this  at discharge. Recent Psychosocial Issues: other health issues which she was neglecting Pyschiatric History: No history would benefit from neuro-psych seeing due to young age and for coping. She has been a sick young woman. Substance Abuse History: No issues  Patient / Family Perceptions, Expectations & Goals Pt/Family understanding of illness & functional limitations: Pt can explain her health issues and what has transpired since she has been in the hospital. She does talk with the rounding MD's and feels her questions and concerns have been addressed. Her aunt also has been here and is involved in her care. Premorbid pt/family roles/activities: Mom, employee, daughter, niece, friend, etc Anticipated changes in roles/activities/participation: resume Pt/family expectations/goals: Pt states: " I want to be able to do for myself before I leave here. " She wants to get home to her daughter whom she misses terribly.  Community Resources Express Scripts: None Premorbid Home Care/DME Agencies: None Transportation available at discharge: [employer] referrals recommended: Neuropsychology, Support group (specify)  Discharge Planning Living Arrangements: Parent, Other relatives Support Systems: Children, Armed forces technical officer, Other relatives, Friends/neighbors, Social worker community Type of Residence: Private residence Insurance Resources: Multimedia programmer (specify) Environmental education officer) Financial Resources: Employment Museum/gallery curator Screen Referred: No Living Expenses: Lives with family Money Management: Patient Does the patient have any problems obtaining your medications?: No Home Management: patient and aunt or mom Patient/Family Preliminary Plans: She plans to go to [facility] home and then resume her job when she can and then would be staying with her aunt. They all get along well and help one another. Mom does work during the day but will be checking in on pt when there. Will need to get pt mod/i to be able to go hoem  and be alone some during the day. Social Work Anticipated Follow Up Needs: HH/OP, Support Group  Clinical Impression Very motivated female who is willing to change her habits and do her part to recover from this health scare. She has involved family members who are willing to assist, but they do work. Currenlty they are caring for her three yo  Daughter. Will work on discharge needs-PCP and follow up therapies. Pt would benefit from seeing neuro-psych while here. Await team's evaluations.  Elease Hashimoto 06/30/2016, 1:21 PM

## 2016-06-30 NOTE — Progress Notes (Signed)
*  Preliminary Results* Bilateral lower extremity venous duplex completed. Study was technically limited and difficult due to patient body habitus. Visualized veins of bilateral lower extremities are negative for deep vein thrombosis. There is no evidence of Baker's cyst bilaterally.  06/30/2016 4:08 PM Maudry Mayhew, BS, RVT, RDCS, RDMS

## 2016-07-01 ENCOUNTER — Inpatient Hospital Stay (HOSPITAL_COMMUNITY): Payer: PRIVATE HEALTH INSURANCE | Admitting: Physical Therapy

## 2016-07-01 ENCOUNTER — Ambulatory Visit (HOSPITAL_COMMUNITY): Payer: Self-pay | Admitting: Speech Pathology

## 2016-07-01 ENCOUNTER — Inpatient Hospital Stay (HOSPITAL_COMMUNITY): Payer: Self-pay | Admitting: Occupational Therapy

## 2016-07-01 DIAGNOSIS — R339 Retention of urine, unspecified: Secondary | ICD-10-CM

## 2016-07-01 LAB — GLUCOSE, CAPILLARY
Glucose-Capillary: 104 mg/dL — ABNORMAL HIGH (ref 65–99)
Glucose-Capillary: 105 mg/dL — ABNORMAL HIGH (ref 65–99)
Glucose-Capillary: 106 mg/dL — ABNORMAL HIGH (ref 65–99)
Glucose-Capillary: 156 mg/dL — ABNORMAL HIGH (ref 65–99)
Glucose-Capillary: 156 mg/dL — ABNORMAL HIGH (ref 65–99)
Glucose-Capillary: 98 mg/dL (ref 65–99)

## 2016-07-01 NOTE — Progress Notes (Signed)
Physical Therapy Session Note  Patient Details  Name: [name] [name] MRN: [id number] Date of Birth: [birth year]  Today's Date: 07/01/2016 PT Individual Time: 0800-0900 and 1400-1430 PT Individual Time Calculation (min): 60 min and 30 min   Short Term Goals: Week 1:  PT Short Term Goal 1 (Week 1): = LTGs due to anticipated LOS  Skilled Therapeutic Interventions/Progress Updates:    Treatment 1: Patient sitting EOB receiving medications upon arrival. Session focused on sit <> stand transfers and ambulation using heavy duty RW 2 x 100 ft with supervision, dynamic standing balance and standing tolerance, generalized strengthening, and activity tolerance. Patient demonstrates significant fall risk as noted by score of 42/56 on Berg Balance Scale, see details below. Performed NuStep using BLE only at level 3 x 5 min. Patient on room air throughout session with Sp02 >88% except after ambulation dropping down to low 80's but recovering quickly with seated rest. Patient left sitting in arm chair with all needs in reach.   Treatment 2: Patient sitting in arm chair upon arrival. Session focused on gait training 2 x 100 ft using RW with supervision and 2 x 50 ft using bariatric SPC with steady assist, stair training up/down 6" step x 4 using 2 rails with step-to pattern and mod A, and sit <> stand transfers with supervision. Patient required rest breaks after ambulation due to Sp02 dropping to 81% on ra increasing >>88% with seated rest. Patient left sitting on toilet, verbalized understanding to call for assistance and nurse tech notified of patient location.   Therapy Documentation Precautions:  Precautions Precautions: Fall Precaution Comments: trach collar with PSMV Restrictions Weight Bearing Restrictions: No Pain: Pain Assessment Pain Assessment: No/denies pain Balance: Balance Balance Assessed: Yes Standardized Balance Assessment Standardized Balance Assessment: Berg Balance Test Berg  Balance Test Sit to Stand: Able to stand  independently using hands Standing Unsupported: Able to stand safely 2 minutes Sitting with Back Unsupported but Feet Supported on Floor or Stool: Able to sit safely and securely 2 minutes Stand to Sit: Sits independently, has uncontrolled descent Transfers: Able to transfer safely, definite need of hands Standing Unsupported with Eyes Closed: Able to stand 10 seconds safely Standing Ubsupported with Feet Together: Able to place feet together independently and stand for 1 minute with supervision From Standing, Reach Forward with Outstretched Arm: Can reach confidently >25 cm (10") From Standing Position, Pick up Object from Floor: Able to pick up shoe, needs supervision From Standing Position, Turn to Look Behind Over each Shoulder: Looks behind from both sides and weight shifts well Turn 360 Degrees: Able to turn 360 degrees safely but slowly Standing Unsupported, Alternately Place Feet on Step/Stool: Able to stand independently and complete 8 steps >20 seconds Standing Unsupported, One Foot in Front: Able to plae foot ahead of the other independently and hold 30 seconds Standing on One Leg: Tries to lift leg/unable to hold 3 seconds but remains standing independently Total Score: 42/56  See Function Navigator for Current Functional Status.   Therapy/Group: Individual Therapy  Laretta Alstrom 07/01/2016, 9:04 AM

## 2016-07-01 NOTE — Progress Notes (Signed)
Occupational Therapy Session Note  Patient Details  Name: [name] [name] MRN: [id number] Date of Birth: 1981/08/15  Today's Date: 07/01/2016 OT Individual Time: 5562-3921 OT Individual Time Calculation (min): 55 min     Short Term Goals: Week 1:  OT Short Term Goal 1 (Week 1): LTG=STG 2/2 estimated short LOS  Skilled Therapeutic Interventions/Progress Updates:    1:1 OT session focused on modified bathing/dressing regarding activity tolerance and energy conservation. Pt completed sponge bath at the sink mostly in standing with 1 seated rest break. SpO2 remained above 90 with supplemental O2. Pt required mod A for LB dressing today with OT providing modified strategies for increased independence. Total A for peri-care as pt continues to experience loose stool. Pt left seated in chair at end of session with needs met.   Therapy Documentation Precautions:  Precautions Precautions: Fall Precaution Comments: trach collar with PSMV Restrictions Weight Bearing Restrictions: No Pain: Pain Assessment Pain Assessment: No/denies pain Pain Score: 0-No pain ADL: ADL ADL Comments: See functional Navigator  See Function Navigator for Current Functional Status.   Therapy/Group: Individual Therapy  Valma Cava 07/01/2016, 4:28 PM

## 2016-07-01 NOTE — Progress Notes (Signed)
[facility] PHYSICAL MEDICINE & REHABILITATION     PROGRESS NOTE  Subjective/Complaints:  Pt seen laying in bed this AM.  She slept well overnight.  She had a good first day of therapies yesterday.   ROS: Denies CP, SOB, N/V/D.  Objective: Vital Signs: Blood pressure 138/86, pulse 93, temperature 98.3 F (36.8 C), temperature source Oral, resp. rate 18, height _0  (1.651 m), weight (!) 153.9 kg (339 lb 4.8 oz), SpO2 97 %, unknown if currently breastfeeding. No results found.  Recent Labs  06/29/16 0300 06/30/16 0500  WBC 6.2 5.0  HGB 13.6 13.3  HCT 44.6 43.9  PLT 155 165    Recent Labs  06/29/16 0300 06/30/16 0500  NA 141 139  K 4.0 3.7  CL 90* 91*  GLUCOSE 105* 98  BUN 33* 32*  CREATININE 1.37* 1.50*  CALCIUM 10.1 9.9   CBG (last 3)   Recent Labs  06/30/16 2004 07/01/16 0022 07/01/16 0430  GLUCAP 119* 105* 106*    Wt Readings from Last 3 Encounters:  07/01/16 (!) 153.9 kg (339 lb 4.8 oz)  06/29/16 86.6 kg (190 lb 14.7 oz)  12/12/13 (!) 167.8 kg (370 lb)    Physical Exam:  BP 138/86   Pulse 93   Temp 98.3 F (36.8 C) (Oral)   Resp 18   Ht _1  (1.651 m)   Wt (!) 153.9 kg (339 lb 4.8 oz)   LMP  (LMP Unknown)   SpO2 97%   BMI 56.46 kg/m  Constitutional: Morbidly obese. NAD. Vital signs reviewed. HENT: Normocephalic. +Trach Eyes: EOM are normal. No discharge. Neck: +IJ. Cardiovascular: Normal rate and regular rhythm.  No JVD. Respiratory: Clear to auscultation. Normal effort. GI: Soft. Bowel sounds are normal. She exhibits no distension.  Musculoskeletal: +Edema. No tenderness. Neurological: She is alert and oriented.  Dysphonia  Follow simple commands.  Motor: 4+/5 throughout Skin: Ischemic vascular changes b/l lower extremities(stable) Psychiatric: She has a normal mood and affect. Her behavior is normal. Judgment and thought content normal.   Assessment/Plan: 1. Functional deficits secondary to CHF/respiratory failure which require 3+  hours per day of interdisciplinary therapy in a comprehensive inpatient rehab setting. Physiatrist is providing close team supervision and 24 hour management of active medical problems listed below. Physiatrist and rehab team continue to assess barriers to discharge/monitor patient progress toward functional and medical goals.  Function:  Bathing Bathing position   Position: Wheelchair/chair at sink  Bathing parts Body parts bathed by patient: Right arm, Left arm, Chest, Abdomen, Front perineal area, Right upper leg, Left upper leg Body parts bathed by helper: Buttocks, Left lower leg, Right lower leg, Back  Bathing assist Assist Level: Touching or steadying assistance(Pt > 75%)      Upper Body Dressing/Undressing Upper body dressing   What is the patient wearing?: Bra, Pull over shirt/dress Bra - Perfomed by patient: Thread/unthread left bra strap, Thread/unthread right bra strap Bra - Perfomed by helper: Hook/unhook bra (pull down sports bra) Pull over shirt/dress - Perfomed by patient: Thread/unthread right sleeve, Thread/unthread left sleeve, Put head through opening, Pull shirt over trunk          Upper body assist Assist Level: Touching or steadying assistance(Pt > 75%)      Lower Body Dressing/Undressing Lower body dressing   What is the patient wearing?: Pants, Non-skid slipper socks, Underwear Underwear - Performed by patient: Pull underwear up/down Underwear - Performed by helper: Thread/unthread right underwear leg, Thread/unthread left underwear leg Pants- Performed by  patient: Pull pants up/down Pants- Performed by helper: Thread/unthread left pants leg, Thread/unthread right pants leg   Non-skid slipper socks- Performed by helper: Don/doff right sock, Don/doff left sock                  Lower body assist Assist for lower body dressing: Touching or steadying assistance (Pt > 75%) (Max A)      Toileting Toileting   Toileting steps completed by patient:  Adjust clothing prior to toileting Toileting steps completed by helper: Performs perineal hygiene, Adjust clothing after toileting Toileting Assistive Devices: Grab bar or rail  Toileting assist Assist level: Touching or steadying assistance (Pt.75%)   Transfers Chair/bed transfer   Chair/bed transfer method: Stand pivot Chair/bed transfer assist level: Touching or steadying assistance (Pt > 75%) Chair/bed transfer assistive device: Armrests, Medical sales representative     Max distance: 120 ft Assist level: Supervision or verbal cues   Wheelchair          Cognition Comprehension Comprehension assist level: Understands complex 90% of the time/cues 10% of the time  Expression Expression assist level: Expresses basic 75 - 89% of the time/requires cueing 10 - 24% of the time. Needs helper to occlude trach/needs to repeat words.  Social Interaction Social Interaction assist level: Interacts appropriately with others with medication or extra time (anti-anxiety, antidepressant).  Problem Solving Problem solving assist level: Solves basic problems with no assist  Memory Memory assist level: More than reasonable amount of time      Medical Problem List and Plan: 1.  Debilitation secondary to CHF/respiratory failure  Cont CIR 2.  DVT Prophylaxis/Anticoagulation: Subcutaneous Lovenox. Monitor platelet counts and any signs of bleeding.   Vascular studies negative DVT 11/8 3. Pain Management: Tylenol as needed 4. 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 No current plans for decannulation. PMV for speech 5. Neuropsych: This patient is capable of making decisions on her own behalf. 6. Skin/Wound Care: Routine skin checks 7. Fluids/Electrolytes/Nutrition: Routine I&O 8. Dysphagia. Dysphagia #1 honey thick liquids. Follow speech therapy. Monitor hydration closely. Attempt to keep patient on the drier side 9. Morbid obesity/hypoventilation syndrome. BMI 50.75.    Plans to keep tracheostomy in place until she has a working CPAP machine in her possession as an outpatient 10. Hypertension/diastolic congestive heart failure:. Lasix 80 mg 3 times a day, Isordil 30 mg 3 times a day, labetalol 300 mg twice a day. Monitor with increased mobility  Keep on drier side             Follow daily weights Filed Weights   06/30/16 0544 07/01/16 0633  Weight: (!) 151.3 kg (333 lb 9.6 oz) (!) 153.9 kg (339 lb 4.8 oz)   Stable 11. Diabetes mellitus type 2  SSI  Monitor with increased activity  Will inquire about home meds 12. AKI  Cr. 1.50 on 11/8  Will attempt to keep relatively dry, which will increase BUN/CR  Will cont to monitor 13. Hypoalbuminemia  Supplement started 11/8 14. Urinary retention  Will d/c foley  Monitor PVRs    LOS (Days) 2 A FACE TO FACE EVALUATION WAS PERFORMED  Givanni Staron Lorie Phenix 07/01/2016 8:52 AM

## 2016-07-01 NOTE — Progress Notes (Signed)
New IC inserted and locked back into place. No complications or respiratory distress noted.

## 2016-07-01 NOTE — Progress Notes (Signed)
Speech Language Pathology Daily Session Note  Patient Details  Name: [name] [name] MRN: [id number] Date of Birth: [birth year]  Today's Date: 07/01/2016 SLP Individual Time: 1105-1200 SLP Individual Time Calculation (min): 55 min   Short Term Goals: Week 1: SLP Short Term Goal 1 (Week 1): STG=LTG due to ELOS   Skilled Therapeutic Interventions:  Pt was seen for skilled ST targeting goals for dysphagia and communication.  Pt was overall min assist for intelligibility at the word level today during functional conversations with SLP due to aphonia.  Therapist discussed implementing respiratory muscle strength program with MD during team conference yesterday.  Pt's peak maximum inspiratory pressure via manometry was 68 cm H20 (ref 86.04 cm H2O).  Peak expiratory pressure was 95 cm H20 (ref 100.07 cm H2O). While pt's MIP and MEP were both above lower limits of normal, given implications for cough strength, vocal cord adduction, and airway protection, recommend implementing expiratory muscle strength training program.  Pt was able to complete 25 repetitions of EMST at 60 cm H20 with a perceived effort level of 4-5/10.  Discussed recommendations to stop exercises if pt becomes short of breath, nauseous, dizzy, or lightheaded.  Pt verbalized understanding.  Pt was also able to return demonstration of pharyngeal strengthening exercises with supervision verbal cues for 25 repetitions each of Masako and effortful swallow.  Pt consumed trials of thin liquids following oral care without overt s/s of aspiration despite fast consumption.  Pt was also mod I for use of swallowing precautions when consuming her currently prescribed diet during lunch meal.  Pt was left in chair with call bell within reach.  Plan for repeat object study at next available appointment to determine readiness for advancement prior to discharge.    Function:  Eating Eating   Modified Consistency Diet: Yes Eating Assist Level: Swallowing  techniques: self managed           Cognition Comprehension Comprehension assist level: Understands complex 90% of the time/cues 10% of the time  Expression   Expression assist level: Expresses basic 75 - 89% of the time/requires cueing 10 - 24% of the time. Needs helper to occlude trach/needs to repeat words.  Social Interaction Social Interaction assist level: Interacts appropriately with others with medication or extra time (anti-anxiety, antidepressant).  Problem Solving Problem solving assist level: Solves basic problems with no assist  Memory Memory assist level: More than reasonable amount of time    Pain Pain Assessment Pain Assessment: No/denies pain  Therapy/Group: Individual Therapy  Starla Deller, Selinda Orion 07/01/2016, 4:14 PM

## 2016-07-02 ENCOUNTER — Encounter (HOSPITAL_COMMUNITY): Payer: Self-pay | Admitting: Speech Pathology

## 2016-07-02 ENCOUNTER — Inpatient Hospital Stay (HOSPITAL_COMMUNITY): Payer: Self-pay | Admitting: Occupational Therapy

## 2016-07-02 ENCOUNTER — Inpatient Hospital Stay (HOSPITAL_COMMUNITY): Payer: PRIVATE HEALTH INSURANCE | Admitting: Physical Therapy

## 2016-07-02 ENCOUNTER — Inpatient Hospital Stay (HOSPITAL_COMMUNITY): Payer: PRIVATE HEALTH INSURANCE | Admitting: Speech Pathology

## 2016-07-02 DIAGNOSIS — R1310 Dysphagia, unspecified: Secondary | ICD-10-CM

## 2016-07-02 LAB — GLUCOSE, CAPILLARY
Glucose-Capillary: 102 mg/dL — ABNORMAL HIGH (ref 65–99)
Glucose-Capillary: 110 mg/dL — ABNORMAL HIGH (ref 65–99)
Glucose-Capillary: 124 mg/dL — ABNORMAL HIGH (ref 65–99)
Glucose-Capillary: 125 mg/dL — ABNORMAL HIGH (ref 65–99)
Glucose-Capillary: 94 mg/dL (ref 65–99)
Glucose-Capillary: 97 mg/dL (ref 65–99)
Glucose-Capillary: 99 mg/dL (ref 65–99)

## 2016-07-02 MED ORDER — POTASSIUM CHLORIDE CRYS ER 20 MEQ PO TBCR
20.0000 meq | EXTENDED_RELEASE_TABLET | Freq: Two times a day (BID) | ORAL | Status: DC
  Administered 2016-07-02 – 2016-07-06 (×9): 20 meq via ORAL
  Filled 2016-07-02 (×9): qty 1

## 2016-07-02 NOTE — Procedures (Signed)
Tracheostomy Change Note  Patient Details:   Name: [name] [name] DOB: August 09, 1981 MRN: [id number]    Airway Documentation:     Evaluation  O2 sats: stable throughout Complications: No apparent complications Patient did tolerate procedure well. Bilateral Breath Sounds: Diminished  SPO2 97% on RA.  Pt able to speak clearly. ETCO2 had good color change.  Littie Deeds [facility] 07/02/2016, 4:00 PM

## 2016-07-02 NOTE — Plan of Care (Signed)
Problem: Food- and Nutrition-Related Knowledge Deficit (NB-1.1) Goal: Nutrition education Formal process to instruct or train a patient/client in a skill or to impart knowledge to help patients/clients voluntarily manage or modify food choices and eating behavior to maintain or improve health. Outcome: Completed/Met Date Met: 07/02/16  RD consulted for nutrition education.  Body mass index is 56.46 kg/m. Pt meets criteria for morbid obesity based on current BMI.  RD provided "Weight Loss Tips", "Counting Carbohydrates for People with Diabetes" handouts from the Academy of [employer] and Dietetics. Discussed carbohydrate containing foods. Emphasized the importance of serving sizes and provided examples of correct portions of common foods. Discussed importance of controlled and consistent intake throughout the day. Provided examples of ways to balance meals/snacks and encouraged intake of high-fiber, whole grain complex carbohydrates. Emphasized the importance of hydration with calorie-free beverages and limiting sugar-sweetened beverages. Teach back method used.  Expect good compliance.  Current diet order is dysphagia 3 diet with thin liquids. Diet has just been advanced and is ready for her lunch tray. Appetite reported good with no other difficulties. Labs and medications reviewed. Pt currently has Prostat ordered and has been consuming them. RD to continue with current orders. No further nutrition interventions warranted at this time. RD contact information provided. If additional nutrition issues arise, please re-consult RD.  Corrin Parker, MS, RD, LDN Pager # [phone] After hours/ weekend pager # [phone]

## 2016-07-02 NOTE — Progress Notes (Signed)
Social Work Patient ID: [name] [name], female   DOB: 1981/03/16, 35 y.o.   MRN: [id number]  Met with pt and spoke with Mom via telephone to set up education for trach teaching. Mom can be here at 10:00 on Monday for Education for trach and any therapy education. Have contacted [phone] to make arrangements for trach education. Will work on pt's discharge needs, equipment and trach supplies. See Mom on Monday. Pt realizes she is going Home with a trach and will need to learn ow to manage it at home.

## 2016-07-02 NOTE — Progress Notes (Signed)
Social Work Patient ID: [name] [name], female   DOB: 1980-12-05, 35 y.o.   MRN: [id number]  Met with pt to discuss getting a PCP to follow her medically at discharge, she will ask her Aunt if she has any recommendations. Discussed having Mom come in Monday and learn the trach care. Will begin researching home health agencies in [place] for follow up. Try to coordinate education and follow up needs.

## 2016-07-02 NOTE — Procedures (Signed)
Objective Swallowing Evaluation: Type of Study: FEES-Fiberoptic Endoscopic Evaluation of Swallow  Patient Details  Name: [name] [name] MRN: [id number] Date of Birth: 15-Jan-1981  Today's Date: 07/02/2016 Time: SLP Start Time : 930-SLP Stop Time : 1015 SLP Time Calculation (min) : 42 min  Past Medical History:  Past Medical History:  Diagnosis Date  . Diabetes mellitus without complication (HCC)    GDM  . Gestational diabetes mellitus in pregnancy 05/24/2013  . HTN in pregnancy, chronic 05/24/2013  . Hypertension   . Morbid obesity ([place])   . Sinus tachycardia    Past Surgical History:  Past Surgical History:  Procedure Laterality Date  . TRACHEOSTOMY TUBE PLACEMENT N/A 06/11/2016   Procedure: TRACHEOSTOMY;  Surgeon: Melida Quitter, MD;  [facility] OR;  Service: ENT;  Laterality: N/A;   HPI: [name] with PMH of morbid obesity (469 lbs), DM, HTN and tachycardia who presented [facility] on 10/[facility] with acute on chronic respiratory failure with hypercarbia and hypoxemia due to diastolic CHF exacerbation and obesity hypoventilation syndrome.  Ultimately required tracheostomy due to prolonged respiratory failure. Diuresed over 100 lbs.  ETT 10/7 - 10/21; trach xl#6 10/21  Subjective: pleasant, using PMV for study   Assessment / Plan / Recommendation  CHL IP CLINICAL IMPRESSIONS 07/02/2016  Therapy Diagnosis Mild pharyngeal phase dysphagia  Clinical Impression Pt presents with a mild pharyngeal dysphagia.  PMV in place for study. No standing secretions. Vocal folds are visible during today's test, revealing persistent posterior glottal chink. Pt demonstrates swallow delay, generally to pyriform sinuses with thin via cup and straw, but larger volume with straw. No penetration/aspiration. No pharyngeal residue noted post swallow. Plan to initiate dysphagia 3, thin liquids.  Meds whole in puree.  Discussed results/recs with pt who verbalized understanding and demonstrated compliance with swallow  precautions.  Impact on safety and function Mild aspiration risk      CHL IP TREATMENT RECOMMENDATION 07/02/2016  Treatment Recommendations Therapy as outlined in treatment plan below     Prognosis 07/02/2016  Prognosis for Safe Diet Advancement Good  Barriers to Reach Goals --  Barriers/Prognosis Comment --    CHL IP DIET RECOMMENDATION 07/02/2016  SLP Diet Recommendations Dysphagia 3 (Mech soft) solids;Thin liquid  Liquid Administration via Cup  Medication Administration Whole meds with puree  Compensations Slow rate;Small sips/bites  Postural Changes Seated upright at 90 degrees      CHL IP OTHER RECOMMENDATIONS 07/02/2016  Recommended Consults --  Oral Care Recommendations Oral care BID  Other Recommendations --      CHL IP FOLLOW UP RECOMMENDATIONS 07/02/2016  Follow up Recommendations Inpatient Rehab      CHL IP FREQUENCY AND DURATION 06/25/2016  Speech Therapy Frequency (ACUTE ONLY) min 3x week  Treatment Duration 2 weeks           CHL IP ORAL PHASE 07/02/2016  Oral Phase WFL  Oral - Pudding Teaspoon --  Oral - Pudding Cup --  Oral - Honey Teaspoon --  Oral - Honey Cup --  Oral - Nectar Teaspoon --  Oral - Nectar Cup --  Oral - Nectar Straw --  Oral - Thin Teaspoon --  Oral - Thin Cup --  Oral - Thin Straw --  Oral - Puree --  Oral - Mech Soft --  Oral - Regular --  Oral - Multi-Consistency --  Oral - Pill --  Oral Phase - Comment --    CHL IP PHARYNGEAL PHASE 07/02/2016  Pharyngeal Phase Impaired  Pharyngeal- Pudding Teaspoon --  Pharyngeal --  Pharyngeal- Pudding Cup --  Pharyngeal --  Pharyngeal- Honey Teaspoon NT  Pharyngeal --  Pharyngeal- Honey Cup --  Pharyngeal --  Pharyngeal- Nectar Teaspoon NT  Pharyngeal --  Pharyngeal- Nectar Cup --  Pharyngeal --  Pharyngeal- Nectar Straw --  Pharyngeal --  Pharyngeal- Thin Teaspoon Delayed swallow initiation-vallecula  Pharyngeal Material does not enter airway  Pharyngeal- Thin Cup Delayed  swallow initiation-pyriform sinuses  Pharyngeal --  Pharyngeal- Thin Straw Delayed swallow initiation-pyriform sinuses  Pharyngeal --  Pharyngeal- Puree WFL  Pharyngeal Material does not enter airway  Pharyngeal- Mechanical Soft --  Pharyngeal --  Pharyngeal- Regular Delayed swallow initiation-vallecula  Pharyngeal --  Pharyngeal- Multi-consistency --  Pharyngeal --  Pharyngeal- Pill --  Pharyngeal --  Pharyngeal Comment --     No flowsheet data found.  No flowsheet data found.  Vinetta Bergamo  MA, CCC-SLP 07/02/2016, 10:51 AM

## 2016-07-02 NOTE — Progress Notes (Signed)
[place] PHYSICAL MEDICINE & REHABILITATION     PROGRESS NOTE  Subjective/Complaints:  Pt seen sitting up in bed this AM.  She has questions about her trach and is worried it will need to stay in place for some times.   ROS: Denies CP, SOB, N/V/D.  Objective: Vital Signs: Blood pressure (!) 132/96, pulse (!) 113, temperature 98.4 F (36.9 C), temperature source Oral, resp. rate 20, height _0  (1.651 m), weight (!) 153.9 kg (339 lb 4.8 oz), SpO2 95 %, unknown if currently breastfeeding. No results found.  Recent Labs  06/30/16 0500  WBC 5.0  HGB 13.3  HCT 43.9  PLT 165    Recent Labs  06/30/16 0500  NA 139  K 3.7  CL 91*  GLUCOSE 98  BUN 32*  CREATININE 1.50*  CALCIUM 9.9   CBG (last 3)   Recent Labs  07/02/16 0109 07/02/16 0445 07/02/16 0807  GLUCAP 99 97 94    Wt Readings from Last 3 Encounters:  07/01/16 (!) 153.9 kg (339 lb 4.8 oz)  06/29/16 86.6 kg (190 lb 14.7 oz)  12/12/13 (!) 167.8 kg (370 lb)    Physical Exam:  BP (!) 132/96   Pulse (!) 113   Temp 98.4 F (36.9 C) (Oral)   Resp 20   Ht _1  (1.651 m)   Wt (!) 153.9 kg (339 lb 4.8 oz)   LMP  (LMP Unknown)   SpO2 95%   BMI 56.46 kg/m  Constitutional: Morbidly obese. NAD. Vital signs reviewed. HENT: Normocephalic. +Trach Eyes: EOM are normal. No discharge. Neck: IJ site healing. Cardiovascular: Normal rate and regular rhythm.  No JVD. Respiratory: Clear to auscultation. Normal effort. GI: Soft. Bowel sounds are normal. She exhibits no distension.  Musculoskeletal: +Edema. No tenderness. Neurological: She is alert and oriented.  Dysphonia, improving Follow simple commands.  Motor: 4+/5 throughout Skin: Ischemic vascular changes b/l lower extremities(stable) Psychiatric: She has a normal mood and affect. Her behavior is normal. Judgment and thought content normal.   Assessment/Plan: 1. Functional deficits secondary to CHF/respiratory failure which require 3+ hours per day of  interdisciplinary therapy in a comprehensive inpatient rehab setting. Physiatrist is providing close team supervision and 24 hour management of active medical problems listed below. Physiatrist and rehab team continue to assess barriers to discharge/monitor patient progress toward functional and medical goals.  Function:  Bathing Bathing position   Position: Wheelchair/chair at sink  Bathing parts Body parts bathed by patient: Right arm, Left arm, Chest, Abdomen, Front perineal area, Right upper leg, Left upper leg Body parts bathed by helper: Buttocks, Left lower leg, Right lower leg, Back  Bathing assist Assist Level: Touching or steadying assistance(Pt > 75%)      Upper Body Dressing/Undressing Upper body dressing   What is the patient wearing?: Bra, Pull over shirt/dress Bra - Perfomed by patient: Thread/unthread left bra strap, Thread/unthread right bra strap Bra - Perfomed by helper: Hook/unhook bra (pull down sports bra) Pull over shirt/dress - Perfomed by patient: Thread/unthread right sleeve, Thread/unthread left sleeve, Put head through opening, Pull shirt over trunk          Upper body assist Assist Level: Touching or steadying assistance(Pt > 75%)      Lower Body Dressing/Undressing Lower body dressing   What is the patient wearing?: Pants, Socks, Underwear Underwear - Performed by patient: Pull underwear up/down Underwear - Performed by helper: Thread/unthread right underwear leg, Thread/unthread left underwear leg Pants- Performed by patient: Pull pants up/down Pants-  Performed by helper: Thread/unthread right pants leg, Thread/unthread left pants leg   Non-skid slipper socks- Performed by helper: Don/doff right sock, Don/doff left sock   Socks - Performed by helper: Don/doff right sock, Don/doff left sock              Lower body assist Assist for lower body dressing: Touching or steadying assistance (Pt > 75%)      Toileting Toileting   Toileting steps  completed by patient: Adjust clothing prior to toileting Toileting steps completed by helper: Performs perineal hygiene, Adjust clothing after toileting Toileting Assistive Devices: Grab bar or rail  Toileting assist Assist level: Touching or steadying assistance (Pt.75%)   Transfers Chair/bed transfer   Chair/bed transfer method: Ambulatory Chair/bed transfer assist level: Supervision or verbal cues Chair/bed transfer assistive device: Armrests, Medical sales representative     Max distance: 300 ft Assist level: Supervision or verbal cues   Wheelchair          Cognition Comprehension Comprehension assist level: Understands complex 90% of the time/cues 10% of the time  Expression Expression assist level: Expresses basic 75 - 89% of the time/requires cueing 10 - 24% of the time. Needs helper to occlude trach/needs to repeat words.  Social Interaction Social Interaction assist level: Interacts appropriately with others with medication or extra time (anti-anxiety, antidepressant).  Problem Solving Problem solving assist level: Solves basic problems with no assist  Memory Memory assist level: More than reasonable amount of time      Medical Problem List and Plan: 1.  Debilitation secondary to CHF/respiratory failure  Cont CIR 2.  DVT Prophylaxis/Anticoagulation: Subcutaneous Lovenox. Monitor platelet counts and any signs of bleeding.   Vascular studies negative DVT 11/8 3. Pain Management: Tylenol as needed 4. 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 No current plans for decannulation. PMV for speech 5. Neuropsych: This patient is capable of making decisions on her own behalf. 6. Skin/Wound Care: Routine skin checks 7. Fluids/Electrolytes/Nutrition: Routine I&O 8. Dysphagia. Dysphagia #1 honey thick liquids. Follow speech therapy. Monitor hydration closely. Attempt to keep patient on the drier side 9. Morbid obesity/hypoventilation syndrome. BMI  50.75.   Plans to keep tracheostomy in place until she has a working CPAP machine in her possession as an outpatient 10. Hypertension/diastolic congestive heart failure:. Lasix 80 mg 3 times a day, Isordil 30 mg 3 times a day, labetalol 300 mg twice a day. Monitor with increased mobility  Keep on drier side             Follow daily weights Filed Weights   06/30/16 0544 07/01/16 0633  Weight: (!) 151.3 kg (333 lb 9.6 oz) (!) 153.9 kg (339 lb 4.8 oz)   Stable  BP relatively controlled 11. Diabetes mellitus type 2  SSI  Monitor with increased activity  Improving 12. AKI  Cr. 1.50 on 11/8  Will attempt to keep relatively dry, which will increase BUN/CR  Will cont to monitor 13. Hypoalbuminemia  Supplement started 11/8 14. Urinary retention: Resolved  D/ced foley  PVRs acceptable    LOS (Days) 3 A FACE TO FACE EVALUATION WAS PERFORMED  Emiel Kielty Lorie Phenix 07/02/2016 9:09 AM

## 2016-07-02 NOTE — Progress Notes (Signed)
Speech Language Pathology Daily Session Note  Patient Details  Name: [name] [name] MRN: [id number] Date of Birth: 1980/11/17  Today's Date: 07/02/2016 SLP Individual Time: 1345-1425 SLP Individual Time Calculation (min): 40 min   Short Term Goals: Week 1: SLP Short Term Goal 1 (Week 1): STG=LTG due to ELOS   Skilled Therapeutic Interventions: Skilled treatment session focused on dysphagia and speech goals. SLP facilitated session by providing intermittent supervision verbal cues for patient to perform pharyngeal strengthening exercises accurately. Patient consumed cup sips of thin liquids and medications whole in puree without overt s/s of aspiration and was Mod I for use of swallowing compensatory strategies. Patient was 100% intelligible at the conversation level with Mod I and completed 5 repetitions of EMST at 60 cm H20 with a perceived effort level of 4-5/10. EMST exercises were discontinued due to PMSV making a noxious noise, suspect due to dried secretions. Patient educated on how to appropriately clean PMSV and verbalized understanding. Patient left upright in chair with all needs within reach. Continue with current plan of care.   Function:  Eating Eating   Modified Consistency Diet: Yes Eating Assist Level: Swallowing techniques: self managed           Cognition Comprehension Comprehension assist level: Follows complex conversation/direction with no assist  Expression   Expression assist level: Expresses complex 90% of the time/cues < 10% of the time  Social Interaction Social Interaction assist level: Interacts appropriately with others with medication or extra time (anti-anxiety, antidepressant).  Problem Solving Problem solving assist level: Solves basic problems with no assist  Memory Memory assist level: Complete Independence: No helper    Pain No/Denies Pain   Therapy/Group: Individual Therapy  Omair Dettmer 07/02/2016, 3:26 PM

## 2016-07-02 NOTE — Progress Notes (Signed)
Physical Therapy Session Note  Patient Details  Name: [name] [name] MRN: [id number] Date of Birth: 06/06/1981  Today's Date: 07/02/2016 PT Individual Time: 0800-0855 PT Individual Time Calculation (min): 55 min    Short Term Goals: Week 1:  PT Short Term Goal 1 (Week 1): = LTGs due to anticipated LOS  Skilled Therapeutic Interventions/Progress Updates:    Patient sitting EOB upon arrival. Session focused on sit <> stand transfers with supervision from arm chair, regular bed and mat table and min-mod A x 2 for safety from low compliant couch surface, bed mobility on regular bed in ADL apartment with supervision, ambulation using heavy duty RW in controlled environment multiple trials up to > 200 ft and up/down ramp x 2 with supervision, stair training up/down 8 (6") stairs using 2 rails with min-mod A, simulated car transfer to raised seat height for sit <> stand with supervision but patient unable to get BLE in/out of car with seat raised, and seated LAQ to fatigue for strengthening during active rest break. Patient instructed in FTSS, see details below. Patient on 4L 02 via trach collar with Fi02 30% with vitals >88% except 1 episode after stair training with 84% improving quickly with seated rest. Patient left sitting in arm chair with all needs in reach.   Five times Sit to Stand Test (FTSS) Method: Use a straight back chair with a solid seat that is 16-18" high. Ask participant to sit on the chair with arms folded across their chest.   Instructions: "Stand up and sit down as quickly as possible 5 times, keeping your arms folded across your chest."   Measurement: Stop timing when the participant stands the 5th time.  TIME: ___19 sec with UE support___ (in seconds)  Times > 13.6 seconds is associated with increased disability and morbidity (Guralnik, 2000) Times > 15 seconds is predictive of recurrent falls in healthy individuals aged 52 and older (Buatois, et al., 2008) Normal  performance values in community dwelling individuals aged 26 and older (Bohannon, 2006): o 60-69 years: 11.4 seconds o 70-79 years: 12.6 seconds o 80-89 years: 14.8 seconds  MCID: ? 2.3 seconds for Vestibular Disorders [name], 2006)  Therapy Documentation Precautions:  Precautions Precautions: Fall Precaution Comments: trach collar with PSMV Restrictions Weight Bearing Restrictions: No Vital Signs: Therapy Vitals Pulse Rate: (!) 113 Resp: 20 BP: (!) 132/96 Patient Position (if appropriate): Standing Oxygen Therapy SpO2: 95 % O2 Device: Tracheostomy Collar O2 Flow Rate (L/min): 4 L/min FiO2 (%): 30 % Pain: Pain Assessment Pain Assessment: No/denies pain   See Function Navigator for Current Functional Status.   Therapy/Group: Individual Therapy  [name] A 07/02/2016, 9:00 AM

## 2016-07-02 NOTE — Progress Notes (Signed)
Occupational Therapy Session Note  Patient Details  Name: Malissa Slay MRN: [id number] Date of Birth: May 20, 1981  Today's Date: 07/02/2016 OT Individual Time: 1100-1200 OT Individual Time Calculation (min): 60 min   Short Term Goals: Week 1:  OT Short Term Goal 1 (Week 1): LTG=STG 2/2 estimated short LOS  Skilled Therapeutic Interventions/Progress Updates:   1:1 OT session focused on modified bathing/dressing and activity tolerance. OT educated pt on technique for showering with trach using trach shower collar shield, and hand held shower hose.  Pt completed walk-in shower transfer w/ RW and supervision Instructed on use of long-handled sponge and pt able to was legs/feet and back today. Pt required assist for sit<>stands and assist to wash buttocks. OT demonstrated use of reacher to don LB clothing, pt demonstrated understanding. Pt left seated in chair at end of session with needs met.   Therapy Documentation Precautions:  Precautions Precautions: Fall Precaution Comments: trach collar with PSMV Restrictions Weight Bearing Restrictions: No Pain:  none/denies pain ADL: ADL ADL Comments: See functional Navigator  See Function Navigator for Current Functional Status.  Therapy/Group: Individual Therapy  Valma Cava 07/02/2016, 12:38 PM

## 2016-07-03 ENCOUNTER — Inpatient Hospital Stay (HOSPITAL_COMMUNITY): Payer: Self-pay | Admitting: Occupational Therapy

## 2016-07-03 ENCOUNTER — Inpatient Hospital Stay (HOSPITAL_COMMUNITY): Payer: Self-pay | Admitting: Physical Therapy

## 2016-07-03 ENCOUNTER — Inpatient Hospital Stay (HOSPITAL_COMMUNITY): Payer: PRIVATE HEALTH INSURANCE | Admitting: Speech Pathology

## 2016-07-03 LAB — GLUCOSE, CAPILLARY
Glucose-Capillary: 103 mg/dL — ABNORMAL HIGH (ref 65–99)
Glucose-Capillary: 106 mg/dL — ABNORMAL HIGH (ref 65–99)
Glucose-Capillary: 115 mg/dL — ABNORMAL HIGH (ref 65–99)
Glucose-Capillary: 120 mg/dL — ABNORMAL HIGH (ref 65–99)
Glucose-Capillary: 145 mg/dL — ABNORMAL HIGH (ref 65–99)
Glucose-Capillary: 153 mg/dL — ABNORMAL HIGH (ref 65–99)
Glucose-Capillary: 94 mg/dL (ref 65–99)

## 2016-07-03 NOTE — Progress Notes (Signed)
Speech Language Pathology Daily Session Note  Patient Details  Name: [name] [name] MRN: [id number] Date of Birth: 1981/03/17  Today's Date: 07/03/2016 SLP Individual Time: 1345-1410 SLP Individual Time Calculation (min): 25 min   Short Term Goals: Week 1: SLP Short Term Goal 1 (Week 1): STG=LTG due to ELOS   Skilled Therapeutic Interventions: Skilled treatment session focused on dysphagia and speech goals. Upon arrival, patient's PMSV was in place and patient was 100% intelligible at the conversation level. Patient also called to place her lunch order via the phone and was 100% intelligible to an unfamiliar communication partner. Patient formed 25 repetitions of EMST with self-perceived effort level of 5/10 and Min A verbal cues needed for accuracy. Patient also consumed thin liquids via cup and medications whole in puree without overt s/s of aspiration. Patient left upright in chair with all needs within reach. Continue with current plan of care.   Function:  Eating Eating   Modified Consistency Diet: Yes Eating Assist Level: Swallowing techniques: self managed           Cognition Comprehension Comprehension assist level: Follows complex conversation/direction with no assist  Expression   Expression assist level: Expresses complex 90% of the time/cues < 10% of the time  Social Interaction Social Interaction assist level: Interacts appropriately with others with medication or extra time (anti-anxiety, antidepressant).  Problem Solving Problem solving assist level: Solves basic problems with no assist  Memory Memory assist level: Complete Independence: No helper    Pain No/Denies Pain   Therapy/Group: Individual Therapy  Frederica Chrestman 07/03/2016, 3:09 PM

## 2016-07-03 NOTE — Progress Notes (Signed)
Occupational Therapy Session Note  Patient Details  Name: [name] [name] MRN: [id number] Date of Birth: [birth year]  Today's Date: 07/03/2016  Session 1 OT Individual Time: 1000-1100 OT Individual Time Calculation (min): 60 min   Session 2 OT Individual Time: 1300-1345 OT Individual Time Calculation (min): 45 min    Short Term Goals: Week 1:  OT Short Term Goal 1 (Week 1): LTG=STG 2/2 estimated short LOS  Skilled Therapeutic Interventions/Progress Updates:    Session 1 1:1 OT session focused on modified bathing/dressing, activity tolerance, and sit<>stands.  Pt able to ambulate short distance in room with close supervision to collect clothing. Pt then completed bathing at the sink, 75% of activity completed in standing; supervision for standing balance. Pt able to reach to wash peri-area and buttocks with increased time and effort.OT demonstrated use of sock-aid and pt able to don/doff socks with supervision per OT instruction. Pt was left seated in chair at end of session.  Session 2 1:1 OT session focused on modified IADL, activity tolerance, and UB strength/endurance.  Pt then ambulated to laundry room w/ RW and OT educated pt on walker positioning for access to laundry and energy conservation techniques during IADL task. Pt took seated rest break, pt desat to 85 during ambulation, requiring short rest break to recover to 94. Pt then ambulated to tub room and OT demonstrated tub shower transfer using tub bench. Pt took standing rest break, then completed  10 minutes total on arm-bike at level 1.8 with 1 minute rest break in between. Pt participated in sit<>stand reaching activity using basketball goal and horse shoes. Incorporated trunk extension, trunk rotation, and balance to reach above and outside base of support. Pt ambulated back to room and left seat in chair at end of session with needs met.   Therapy Documentation Precautions:  Precautions Precautions: Fall Precaution  Comments: trach collar with PSMV Restrictions Weight Bearing Restrictions: No Pain:  none/denies pain ADL: ADL ADL Comments: See functional Navigator  See Function Navigator for Current Functional Status.   Therapy/Group: Individual Therapy  Valma Cava 07/03/2016, 2:39 PM

## 2016-07-03 NOTE — Progress Notes (Signed)
Physical Therapy Session Note  Patient Details  Name: Kriste Broman MRN: [id number] Date of Birth: Dec 30, 1980  Today's Date: 07/03/2016 PT Individual Time: 0800-0900 PT Individual Time Calculation (min): 60 min    Short Term Goals: Week 1:  PT Short Term Goal 1 (Week 1): = LTGs due to anticipated LOS  Skilled Therapeutic Interventions/Progress Updates:   Pt presented sitting at EOB agreeable to therapy.  Sit to stand from elevated bed with RW supervision. Ambulated 26f with RW SpO2 90% on RA desat upon sitting 86% however quick to recover without supplemental O2. Discussed energy conservation techniques as pt is asymptomatic with destat.  Performed sit to stand x5 with UE support from 20", 19", 18", 17" heights.  Cues for increasing forward wt shift and getting closer to EOM to facilitate transfer. SpO2 maintained >88% during transfers. Performed LE therex for strengthening to facilitate transfers. Pt performed LAQ 3sec hold 2x12, Level 2 resisted long sit hip abd 2x10. NuStep LE only L3 x5 min, with pt continuing to maintain SpO2 >88%.  Pt ambulated back to room with pt requesting to sit in chair. Left in room with all needs met.   Therapy Documentation Precautions:  Precautions Precautions: Fall Precaution Comments: trach collar with PSMV Restrictions Weight Bearing Restrictions: No General:   Vital Signs: Therapy Vitals Patient Position (if appropriate):  (In rehab gym) Oxygen Therapy O2 Device: Not Delivered Pain:   Mobility:   Locomotion :    Trunk/Postural Assessment :    Balance:   Exercises:   Other Treatments:     See Function Navigator for Current Functional Status.   Therapy/Group: Individual Therapy  Tennelle Taflinger  Bruno Leach, PTA  07/03/2016, 11:44 AM

## 2016-07-03 NOTE — Progress Notes (Signed)
[place] PHYSICAL MEDICINE & REHABILITATION     PROGRESS NOTE  Subjective/Complaints:  Washing up at sink, no complaints  ROS: Denies CP, SOB, N/V/D.  Objective: Vital Signs: Blood pressure (!) 150/94, pulse 88, temperature 98.9 F (37.2 C), temperature source Oral, resp. rate 20, height _0  (1.651 m), weight (!) 150.6 kg (332 lb 1.6 oz), SpO2 98 %, unknown if currently breastfeeding. No results found. No results for input(s): WBC, HGB, HCT, PLT in the last 72 hours. No results for input(s): NA, K, CL, GLUCOSE, BUN, CREATININE, CALCIUM in the last 72 hours.  Invalid input(s): CO CBG (last 3)   Recent Labs  07/03/16 0359 07/03/16 0710 07/03/16 0802  GLUCAP 103* 106* 153*    Wt Readings from Last 3 Encounters:  07/03/16 (!) 150.6 kg (332 lb 1.6 oz)  06/29/16 86.6 kg (190 lb 14.7 oz)  12/12/13 (!) 167.8 kg (370 lb)    Physical Exam:  BP (!) 150/94   Pulse 88   Temp 98.9 F (37.2 C) (Oral)   Resp 20   Ht _1  (1.651 m)   Wt (!) 150.6 kg (332 lb 1.6 oz)   LMP  (LMP Unknown)   SpO2 98%   BMI 55.26 kg/m  Constitutional: Morbidly obese. NAD. Vital signs reviewed. HENT: Normocephalic. +Trach Eyes: EOM are normal. No discharge. Neck: IJ site healing. Cardiovascular: Normal rate and regular rhythm.  No JVD. Respiratory: Clear to auscultation. Normal effort. GI: Soft. Bowel sounds are normal. She exhibits no distension.  Musculoskeletal: +Edema. No tenderness. Neurological: She is alert and oriented.  Dysphonia, improving Follow simple commands.  Motor: 4+/5 throughout Skin: Ischemic vascular changes b/l lower extremities(stable) Psychiatric: She has a normal mood and affect. Her behavior is normal. Judgment and thought content normal.   Assessment/Plan: 1. Functional deficits secondary to CHF/respiratory failure which require 3+ hours per day of interdisciplinary therapy in a comprehensive inpatient rehab setting. Physiatrist is providing close team supervision  and 24 hour management of active medical problems listed below. Physiatrist and rehab team continue to assess barriers to discharge/monitor patient progress toward functional and medical goals.  Function:  Bathing Bathing position   Position: Wheelchair/chair at sink  Bathing parts Body parts bathed by patient: Right arm, Left arm, Chest, Front perineal area, Abdomen, Buttocks, Right upper leg, Left upper leg, Right lower leg, Left lower leg Body parts bathed by helper: Back  Bathing assist Assist Level: Supervision or verbal cues Assistive Device Comment: long-handled sponge    Upper Body Dressing/Undressing Upper body dressing   What is the patient wearing?: Bra, Pull over shirt/dress Bra - Perfomed by patient: Thread/unthread right bra strap, Thread/unthread left bra strap, Hook/unhook bra (pull down sports bra) Bra - Perfomed by helper: Hook/unhook bra (pull down sports bra) Pull over shirt/dress - Perfomed by patient: Thread/unthread right sleeve, Thread/unthread left sleeve, Pull shirt over trunk, Put head through opening          Upper body assist Assist Level: Supervision or verbal cues      Lower Body Dressing/Undressing Lower body dressing   What is the patient wearing?: Pants, Socks, Underwear Underwear - Performed by patient: Thread/unthread right underwear leg, Thread/unthread left underwear leg Underwear - Performed by helper: Pull underwear up/down Pants- Performed by patient: Thread/unthread right pants leg, Thread/unthread left pants leg, Pull pants up/down Pants- Performed by helper: Thread/unthread right pants leg, Thread/unthread left pants leg   Non-skid slipper socks- Performed by helper: Don/doff right sock, Don/doff left sock Socks - Performed  by patient: Don/doff right sock, Don/doff left sock Socks - Performed by helper: Don/doff left sock, Don/doff right sock              Lower body assist Assist for lower body dressing: Touching or steadying  assistance (Pt > 75%) Assistive Device Comment: Engineer, maintenance (IT)   Toileting steps completed by patient: Adjust clothing prior to toileting Toileting steps completed by helper: Adjust clothing prior to toileting, Performs perineal hygiene, Adjust clothing after toileting (per Gilman Buttner, NT) Toileting Assistive Devices: Grab bar or rail  Toileting assist Assist level: Touching or steadying assistance (Pt.75%)   Transfers Chair/bed transfer   Chair/bed transfer method: Ambulatory Chair/bed transfer assist level: Supervision or verbal cues Chair/bed transfer assistive device: Armrests, Medical sales representative     Max distance: 300 ft Assist level: Supervision or verbal cues   Wheelchair          Cognition Comprehension Comprehension assist level: Follows complex conversation/direction with no assist  Expression Expression assist level: Expresses complex 90% of the time/cues < 10% of the time  Social Interaction Social Interaction assist level: Interacts appropriately with others with medication or extra time (anti-anxiety, antidepressant).  Problem Solving Problem solving assist level: Solves basic problems with no assist  Memory Memory assist level: Complete Independence: No helper      Medical Problem List and Plan: 1.  Debilitation secondary to CHF/respiratory failure  Cont CIR Patient asking about duration of work restriction, directed her to primary service 2.  DVT Prophylaxis/Anticoagulation: Subcutaneous Lovenox. Monitor platelet counts and any signs of bleeding.   Vascular studies negative DVT 11/8 3. Pain Management: Tylenol as needed 4. 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 No current plans for decannulation. PMV for speech 5. Neuropsych: This patient is capable of making decisions on her own behalf. 6. Skin/Wound Care: Routine skin checks 7. Fluids/Electrolytes/Nutrition: Routine I&O 8. Dysphagia.  Dysphagia #1 honey thick liquids. Follow speech therapy. Monitor hydration closely. Attempt to keep patient on the drier side 9. Morbid obesity/hypoventilation syndrome. BMI 50.75.   Plans to keep tracheostomy in place until she has a working CPAP machine in her possession as an outpatient 10. Hypertension/diastolic congestive heart failure:. Lasix 80 mg 3 times a day, Isordil 30 mg 3 times a day, labetalol 300 mg twice a day. Monitor with increased mobility  Keep on drier side             Follow daily weights Filed Weights   07/01/16 0633 07/02/16 0500 07/03/16 0408  Weight: (!) 153.9 kg (339 lb 4.8 oz) (!) 152 kg (335 lb 3 oz) (!) 150.6 kg (332 lb 1.6 oz)   Stable  BP relatively controlled 11. Diabetes mellitus type 2  SSI  Monitor with increased activity  Improving 12. AKI  Cr. 1.50 on 11/8  Will attempt to keep relatively dry, which will increase BUN/CR  Will cont to monitor 13. Hypoalbuminemia . Continue prostat   LOS (Days) 4 A FACE TO FACE EVALUATION WAS PERFORMED  Alysia Penna E 07/03/2016 11:28 AM

## 2016-07-04 ENCOUNTER — Inpatient Hospital Stay (HOSPITAL_COMMUNITY): Payer: PRIVATE HEALTH INSURANCE | Admitting: Physical Therapy

## 2016-07-04 LAB — GLUCOSE, CAPILLARY
Glucose-Capillary: 103 mg/dL — ABNORMAL HIGH (ref 65–99)
Glucose-Capillary: 111 mg/dL — ABNORMAL HIGH (ref 65–99)
Glucose-Capillary: 145 mg/dL — ABNORMAL HIGH (ref 65–99)
Glucose-Capillary: 108 mg/dL — ABNORMAL HIGH (ref 65–99)
Glucose-Capillary: 105 mg/dL — ABNORMAL HIGH (ref 65–99)

## 2016-07-04 NOTE — Progress Notes (Addendum)
Physical Therapy Discharge Summary  Patient Details  Name: [name] [name] MRN: [id number] Date of Birth: 1980/10/24  Today's Date: 07/05/2016   Patient has met 7 of 7 long term goals due to improved activity tolerance, improved balance, improved postural control, increased strength, decreased pain and ability to compensate for deficits.  Patient to discharge at an ambulatory level Modified Independent.   Patient's care partner is independent to provide the necessary physical assistance at discharge.  Reasons goals not met: N/A  Recommendation:  Patient will benefit from ongoing skilled PT services in home health setting to continue to advance safe functional mobility, address ongoing impairments in strength, balance, activity tolerance, oxygen support, and minimize fall risk.  Equipment: Heavy duty RW  Reasons for discharge: treatment goals met and discharge from hospital  Patient/family agrees with progress made and goals achieved: Yes  PT Discharge Precautions/Restrictions Precautions Precaution Comments: trach collar with PSMV Restrictions Weight Bearing Restrictions: No  Pain Pain Assessment Pain Assessment: No/denies pain   Vision/Perception   No change from baseline   Cognition Overall Cognitive Status: Within Functional Limits for tasks assessed Arousal/Alertness: Awake/alert Orientation Level: Oriented X4 Memory: Appears intact Safety/Judgment: Appears intact   Sensation Sensation Light Touch: Appears Intact Stereognosis: Appears Intact Hot/Cold: Appears Intact Proprioception: Appears Intact Coordination Gross Motor Movements are Fluid and Coordinated: Yes Fine Motor Movements are Fluid and Coordinated: Yes  Motor  Motor Motor: Within Functional Limits   Mobility Bed Mobility Bed Mobility: Supine to Sit;Sit to Supine Supine to Sit: 6: Modified independent (Device/Increase time) Sit to Supine: 6: Modified independent (Device/Increase  time) Transfers Transfers: Yes Stand Pivot Transfers: 6: Modified independent (Device/Increase time);With armrests  Locomotion  Ambulation Ambulation: Yes Ambulation/Gait Assistance: 6: Modified independent (Device/Increase time) Ambulation Distance (Feet):  (pt ambulates up to 150 ft while in CIR) Assistive device: Rolling walker Gait Gait: Yes Gait Pattern: Decreased step length - right;Decreased step length - left Gait velocity: decreased gait speed overall Stairs / Additional Locomotion Stairs: Yes Stairs Assistance: 4: Min assist Stair Management Technique: One rail Left;One rail Right Number of Stairs:  (4 steps +4 steps ) Height of Stairs:  (6 inches) Wheelchair Mobility Wheelchair Mobility: No   Trunk/Postural Assessment  Cervical Assessment Cervical Assessment: Exceptions to [facility] (forward head) Thoracic Assessment Thoracic Assessment: Exceptions to [facility] (rounded shoulders) Lumbar Assessment Lumbar Assessment: Exceptions to [facility] (posterior pelvic tilt) Postural Control Postural Control: Within Functional Limits   Balance Balance Balance Assessed: Yes Standardized Balance Assessment Standardized Balance Assessment: Berg Balance Test Berg Balance Test Sit to Stand: Able to stand  independently using hands Standing Unsupported: Able to stand safely 2 minutes Sitting with Back Unsupported but Feet Supported on Floor or Stool: Able to sit safely and securely 2 minutes Stand to Sit: Sits safely with minimal use of hands Transfers: Able to transfer safely, definite need of hands Standing Unsupported with Eyes Closed: Able to stand 10 seconds safely Standing Ubsupported with Feet Together: Able to place feet together independently and stand 1 minute safely From Standing, Reach Forward with Outstretched Arm: Can reach confidently >25 cm (10") From Standing Position, Pick up Object from Floor: Able to pick up shoe, needs supervision From Standing Position, Turn to Look  Behind Over each Shoulder: Looks behind from both sides and weight shifts well Turn 360 Degrees: Able to turn 360 degrees safely but slowly Standing Unsupported, Alternately Place Feet on Step/Stool: Able to stand independently and safely and complete 8 steps in 20 seconds Standing Unsupported, One Foot in  Front: Able to place foot tandem independently and hold 30 seconds Standing on One Leg: Tries to lift leg/unable to hold 3 seconds but remains standing independently Total Score: 48 Dynamic Standing Balance Dynamic Standing - Balance Support: During functional activity Dynamic Standing - Level of Assistance: 6: Modified independent (Device/Increase time)  Extremity Assessment  RUE Assessment RUE Assessment: Within Functional Limits LUE Assessment LUE Assessment: Within Functional Limits RLE Assessment RLE Assessment: Within Functional Limits LLE Assessment LLE Assessment: Within Functional Limits   See Function Navigator for Current Functional Status.  Theodoro Parma, PT, DPT 07/05/2016, 12:00 PM

## 2016-07-04 NOTE — Progress Notes (Signed)
[facility] PHYSICAL MEDICINE & REHABILITATION     PROGRESS NOTE  Subjective/Complaints:  Pt doing ok in therapy on Nustep without SOB  ROS: Denies CP, SOB, N/V/D.  Objective: Vital Signs: Blood pressure 126/66, pulse 96, temperature 98.3 F (36.8 C), temperature source Oral, resp. rate 18, height _0  (1.651 m), weight (!) 150.6 kg (332 lb 1.6 oz), SpO2 96 %, unknown if currently breastfeeding. No results found. No results for input(s): WBC, HGB, HCT, PLT in the last 72 hours. No results for input(s): NA, K, CL, GLUCOSE, BUN, CREATININE, CALCIUM in the last 72 hours.  Invalid input(s): CO CBG (last 3)   Recent Labs  07/03/16 1953 07/04/16 0000 07/04/16 0415  GLUCAP 145* 115* 103*    Wt Readings from Last 3 Encounters:  07/03/16 (!) 150.6 kg (332 lb 1.6 oz)  06/29/16 86.6 kg (190 lb 14.7 oz)  12/12/13 (!) 167.8 kg (370 lb)    Physical Exam:  BP 126/66 (BP Location: Left Arm)   Pulse 96   Temp 98.3 F (36.8 C) (Oral)   Resp 18   Ht _1  (1.651 m)   Wt (!) 150.6 kg (332 lb 1.6 oz)   LMP  (LMP Unknown)   SpO2 96%   BMI 55.26 kg/m  Constitutional: Morbidly obese. NAD. Vital signs reviewed. HENT: Normocephalic. +Trach Eyes: EOM are normal. No discharge. Neck: IJ site healing. Cardiovascular: Normal rate and regular rhythm.  No JVD. Respiratory: Clear to auscultation. Normal effort. GI: Soft. Bowel sounds are normal. She exhibits no distension.  Musculoskeletal: +Edema. No tenderness. Neurological: She is alert and oriented.  Dysphonia, improving Follow simple commands.  Motor: 4+/5 throughout Skin: Ischemic vascular changes b/l lower extremities(stable) Psychiatric: She has a normal mood and affect. Her behavior is normal. Judgment and thought content normal.   Assessment/Plan: 1. Functional deficits secondary to CHF/respiratory failure which require 3+ hours per day of interdisciplinary therapy in a comprehensive inpatient rehab setting. Physiatrist is  providing close team supervision and 24 hour management of active medical problems listed below. Physiatrist and rehab team continue to assess barriers to discharge/monitor patient progress toward functional and medical goals.  Function:  Bathing Bathing position   Position: Wheelchair/chair at sink  Bathing parts Body parts bathed by patient: Right arm, Left arm, Chest, Front perineal area, Abdomen, Buttocks, Right upper leg, Left upper leg, Right lower leg, Left lower leg Body parts bathed by helper: Back  Bathing assist Assist Level: Supervision or verbal cues Assistive Device Comment: long-handled sponge    Upper Body Dressing/Undressing Upper body dressing   What is the patient wearing?: Bra, Pull over shirt/dress Bra - Perfomed by patient: Thread/unthread right bra strap, Thread/unthread left bra strap, Hook/unhook bra (pull down sports bra) Bra - Perfomed by helper: Hook/unhook bra (pull down sports bra) Pull over shirt/dress - Perfomed by patient: Thread/unthread right sleeve, Thread/unthread left sleeve, Pull shirt over trunk, Put head through opening          Upper body assist Assist Level: Supervision or verbal cues      Lower Body Dressing/Undressing Lower body dressing   What is the patient wearing?: Pants, Socks, Underwear Underwear - Performed by patient: Thread/unthread right underwear leg, Thread/unthread left underwear leg Underwear - Performed by helper: Pull underwear up/down Pants- Performed by patient: Thread/unthread right pants leg, Thread/unthread left pants leg, Pull pants up/down Pants- Performed by helper: Thread/unthread right pants leg, Thread/unthread left pants leg   Non-skid slipper socks- Performed by helper: Don/doff right sock, Don/doff  left sock Socks - Performed by patient: Don/doff right sock, Don/doff left sock Socks - Performed by helper: Don/doff left sock, Don/doff right sock              Lower body assist Assist for lower body  dressing: Touching or steadying assistance (Pt > 75%) Assistive Device Comment: Engineer, maintenance (IT)   Toileting steps completed by patient: Adjust clothing prior to toileting, Performs perineal hygiene, Adjust clothing after toileting Toileting steps completed by helper: Adjust clothing prior to toileting, Performs perineal hygiene, Adjust clothing after toileting (per Gilman Buttner, NT) Toileting Assistive Devices: Grab bar or rail  Toileting assist Assist level: Touching or steadying assistance (Pt.75%)   Transfers Chair/bed transfer   Chair/bed transfer method: Ambulatory Chair/bed transfer assist level: Supervision or verbal cues Chair/bed transfer assistive device: Armrests, Medical sales representative     Max distance: 150 ft Assist level: Supervision or verbal cues   Wheelchair          Cognition Comprehension Comprehension assist level: Follows complex conversation/direction with no assist  Expression Expression assist level: Expresses complex 90% of the time/cues < 10% of the time  Social Interaction Social Interaction assist level: Interacts appropriately with others with medication or extra time (anti-anxiety, antidepressant).  Problem Solving Problem solving assist level: Solves basic problems with no assist  Memory Memory assist level: Complete Independence: No helper      Medical Problem List and Plan: 1.  Debilitation secondary to CHF/respiratory failure  Cont CIR Patient asking about duration of work restriction, directed her to primary service 2.  DVT Prophylaxis/Anticoagulation: Subcutaneous Lovenox. Monitor platelet counts and any signs of bleeding.   Vascular studies negative DVT 11/8 3. Pain Management: Tylenol as needed 4. 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 No current plans for decannulation. PMV for speech 5. Neuropsych: This patient is capable of making decisions on her own behalf. 6. Skin/Wound  Care: Routine skin checks 7. Fluids/Electrolytes/Nutrition: Routine I&O 8. Dysphagia. Dysphagia #1 honey thick liquids. Follow speech therapy. Monitor hydration closely. Attempt to keep patient on the drier side 9. Morbid obesity/hypoventilation syndrome. BMI 50.75.   Plans to keep tracheostomy in place until she has a working CPAP machine in her possession as an outpatient 10. Hypertension/diastolic congestive heart failure:. Lasix 80 mg 3 times a day, Isordil 30 mg 3 times a day, labetalol 300 mg twice a day. Monitor with increased mobility  Keep on drier side             Follow daily weights Filed Weights   07/01/16 0633 07/02/16 0500 07/03/16 0408  Weight: (!) 153.9 kg (339 lb 4.8 oz) (!) 152 kg (335 lb 3 oz) (!) 150.6 kg (332 lb 1.6 oz)   Stable  BP relatively controlled 11. Diabetes mellitus type 2  SSI  Monitor with increased activity  Improving 12. AKI  Cr. 1.50 on 11/8  Will attempt to keep relatively dry, which will increase BUN/CR  Will cont to monitor 13. Hypoalbuminemia . Continue prostat   LOS (Days) 5 A FACE TO FACE EVALUATION WAS PERFORMED  Alysia Penna E 07/04/2016 10:06 AM

## 2016-07-04 NOTE — Progress Notes (Signed)
Physical Therapy Session Note  Patient Details  Name: [name] [name] MRN: [id number] Date of Birth: 08/31/1980  Today's Date: 07/04/2016 PT Individual Time: 0800-0900 PT Individual Time Calculation (min): 60 min    Short Term Goals: Week 1:  PT Short Term Goal 1 (Week 1): = LTGs due to anticipated LOS  Skilled Therapeutic Interventions/Progress Updates:    Patient received sitting EOB on room air. Session focused on gait training using RW 2 x 150 ft with supervision > mod I, sit <> stand transfers with BUE support, stair training to simulate home entry x 8 (6") stairs using 1 rail only with mod A to ascend and min A to descend, OTAGO HEP for BLE strengthening and falls prevention with handout provided, and NuStep using BLE only at level 5 x 5 min for strengthening and endurance. Patient demonstrates moderate fall risk as noted by score of 48/56 on Berg Balance Scale, improved from 42/56 on evaluation. Discussed need for family training with mother tomorrow for stairs and possibly for actual car transfer to mom's car. Patient's Sp02 monitored throughout session on room air dropping into low 80's with first bout of ambulation and stairs but recovering quickly with seated/standing rest breaks and cues for pursed lip breathing. Patient left sitting in chair with all needs within reach.   Therapy Documentation Precautions:  Precautions Precautions: Fall Precaution Comments: trach collar with PSMV Restrictions Weight Bearing Restrictions: No Pain: Pain Assessment Pain Assessment: No/denies pain Balance: Balance Balance Assessed: Yes Standardized Balance Assessment Standardized Balance Assessment: Berg Balance Test Berg Balance Test Sit to Stand: Able to stand  independently using hands Standing Unsupported: Able to stand safely 2 minutes Sitting with Back Unsupported but Feet Supported on Floor or Stool: Able to sit safely and securely 2 minutes Stand to Sit: Sits safely with minimal use  of hands Transfers: Able to transfer safely, definite need of hands Standing Unsupported with Eyes Closed: Able to stand 10 seconds safely Standing Ubsupported with Feet Together: Able to place feet together independently and stand 1 minute safely From Standing, Reach Forward with Outstretched Arm: Can reach confidently >25 cm (10") From Standing Position, Pick up Object from Floor: Able to pick up shoe, needs supervision From Standing Position, Turn to Look Behind Over each Shoulder: Looks behind from both sides and weight shifts well Turn 360 Degrees: Able to turn 360 degrees safely but slowly Standing Unsupported, Alternately Place Feet on Step/Stool: Able to stand independently and safely and complete 8 steps in 20 seconds Standing Unsupported, One Foot in Front: Able to place foot tandem independently and hold 30 seconds Standing on One Leg: Tries to lift leg/unable to hold 3 seconds but remains standing independently Total Score: 48 Dynamic Standing Balance Dynamic Standing - Balance Support: During functional activity Dynamic Standing - Level of Assistance: 6: Modified independent (Device/Increase time)   See Function Navigator for Current Functional Status.   Therapy/Group: Individual Therapy  Laretta Alstrom 07/04/2016, 8:45 AM

## 2016-07-05 ENCOUNTER — Inpatient Hospital Stay (HOSPITAL_COMMUNITY): Payer: Self-pay | Admitting: Occupational Therapy

## 2016-07-05 ENCOUNTER — Inpatient Hospital Stay (HOSPITAL_COMMUNITY): Payer: PRIVATE HEALTH INSURANCE | Admitting: Physical Therapy

## 2016-07-05 ENCOUNTER — Inpatient Hospital Stay (HOSPITAL_COMMUNITY): Payer: PRIVATE HEALTH INSURANCE | Admitting: Speech Pathology

## 2016-07-05 LAB — BASIC METABOLIC PANEL
Anion gap: 13 (ref 5–15)
BUN: 39 mg/dL — ABNORMAL HIGH (ref 6–20)
CO2: 31 mmol/L (ref 22–32)
Calcium: 9.7 mg/dL (ref 8.9–10.3)
Chloride: 91 mmol/L — ABNORMAL LOW (ref 101–111)
Creatinine, Ser: 1.69 mg/dL — ABNORMAL HIGH (ref 0.44–1.00)
GFR calc Af Amer: 44 mL/min — ABNORMAL LOW (ref >60)
GFR calc non Af Amer: 38 mL/min — ABNORMAL LOW (ref >60)
Glucose, Bld: 111 mg/dL — ABNORMAL HIGH (ref 65–99)
Potassium: 3.9 mmol/L (ref 3.5–5.1)
Sodium: 135 mmol/L (ref 135–145)

## 2016-07-05 LAB — GLUCOSE, CAPILLARY
Glucose-Capillary: 102 mg/dL — ABNORMAL HIGH (ref 65–99)
Glucose-Capillary: 104 mg/dL — ABNORMAL HIGH (ref 65–99)
Glucose-Capillary: 106 mg/dL — ABNORMAL HIGH (ref 65–99)
Glucose-Capillary: 123 mg/dL — ABNORMAL HIGH (ref 65–99)
Glucose-Capillary: 98 mg/dL (ref 65–99)

## 2016-07-05 MED ORDER — ADULT MULTIVITAMIN W/MINERALS CH
1.0000 | ORAL_TABLET | Freq: Every day | ORAL | Status: DC
  Administered 2016-07-05 – 2016-07-06 (×2): 1 via ORAL
  Filled 2016-07-05 (×2): qty 1

## 2016-07-05 MED ORDER — ACETAMINOPHEN 325 MG PO TABS
650.0000 mg | ORAL_TABLET | Freq: Four times a day (QID) | ORAL | Status: DC | PRN
  Administered 2016-07-05 – 2016-07-06 (×2): 650 mg via ORAL
  Filled 2016-07-05 (×2): qty 2

## 2016-07-05 MED ORDER — ACETAMINOPHEN 160 MG/5ML PO SOLN: 650.0000 mg | Freq: Four times a day (QID) | ORAL | Status: DC | PRN

## 2016-07-05 NOTE — Progress Notes (Signed)
Social Work Patient ID: [name] [name], female   DOB: 05-11-1981, 35 y.o.   MRN: [id number]  Met with pt and Mom how is here for family training and trach care. Discussed trach supplies, O2 at night and equipment along with  Home Health follow up. Pt really wants to have the trach discharged, but will need to be compliant and follow up with her appointments. Will work on arranging home health and find an agency that takes trach pt.

## 2016-07-05 NOTE — Discharge Summary (Signed)
[name] [name], [name] [name]               ACCOUNT NO.:  [id number]  MEDICAL RECORD NO.:  [id number]  LOCATION:  [id number]                        FACILITY:  [facility]  PHYSICIAN:  [name] [name], P.A.  DATE OF BIRTH:  [birth year]  DATE OF ADMISSION:  06/29/2016 DATE OF DISCHARGE:  07/06/2016                              DISCHARGE SUMMARY   DISCHARGE DIAGNOSES: 1. Debilitation secondary to congestive heart failure, respiratory     failure. 2. Subcutaneous Lovenox for deep venous thrombosis prophylaxis. 3. [name] management. 4. Tracheostomy, June 11, 2016. 5. Dysphagia. 6. Morbid obesity with hypoventilation syndrome. 7. Hypertension. 8. Diastolic congestive heart failure. 9. Diabetes mellitus. 10.Acute kidney injury, improved.  HISTORY OF PRESENT ILLNESS:  This is a 35 year old right-handed female, history of uncontrolled hypertension, morbid obesity, 337 pounds, medical noncompliance.  Independent prior to admission.  She has a 39- year-old daughter.  Presented on May 25, 2016, with progressive shortness of breath on exertion with lower extremity edema.  Oxygen saturations in the 70s.  Blood pressure elevated to 215/151.  Chest x- ray consistent with CHF.  EKG showed sinus tachycardia but no acute ST elevations.  CTA of the chest negative for pulmonary emboli.  Mildly elevated troponin felt to be related to heart strain.  BNP elevated at 600.  Placed on intravenous Lasix and diuresed over 100 pounds since her admission.  Wound Care consulted for bilateral lower extremity edema and weeping of wounds.  Echocardiogram with ejection fraction of 55%, normal systolic function.  Required intubation for respiratory failure on May 28, 2016 per Critical Care Medicine and later tracheostomy required on June 11, 2016 per Dr. Redmond [name].  The patient is ATC throughout the day with capping trials with a #6 proximal XLT trach in place.  No current plan for decannulation.  Subcutaneous Lovenox for  DVT prophylaxis.  Diet slowly advanced to a dysphagia #1 honey thick liquid. Physical and occupational therapy ongoing.  The patient was admitted for comprehensive rehab program.  PAST MEDICAL HISTORY:  See discharge diagnoses.  SOCIAL HISTORY:  Independent prior to admission.  She has a 73-year-old daughter.  FUNCTIONAL STATUS:  Functional status upon admission to rehab services was minimal assist, 80 feet rolling walker; +2 physical assist, sit to stand; min to mod assist, activities of daily living.  PHYSICAL EXAMINATION:  VITAL SIGNS:  Blood pressure 142/89, pulse 102, temperature 97, respirations 20. GENERAL:  This was an alert female, oriented to person, place, and time. She had a #6 proximal XLT trach in place. LUNGS:  Limited inspiratory effort but clear to auscultation. CARDIAC:  Regular rate and rhythm.  No murmur. ABDOMEN:  Soft, nontender.  Good bowel sounds.  Manual muscle testing 4+/5 proximal to distal 3/5 hip flexors, 3/5 knee extension, 4/5 ankle dorsi plantar flexion.  She had some ischemic vascular changes in lower extremities.  REHABILITATION HOSPITAL COURSE:  The patient was admitted to inpatient rehab services with therapies initiated on a 3-hour daily basis, consisting of physical therapy, occupational therapy, speech therapy, and rehabilitation nursing.  The following issues were addressed during the patient's rehabilitation stay.  Pertaining to [name]' debilitation secondary to CHF, respiratory failure, she exhibited no other signs of fluid overload.  Her trach was currently a #6 cuffless trach and would follow up outpatient respiratory pulmonary services at the trach clinic.  Subcutaneous Lovenox for DVT prophylaxis.  No bleeding episodes.  Vascular studies negative.  Her diet had been slowly advanced to a dysphagia #3 thin liquid.  Blood sugars well controlled. Noted morbid obesity, hypoventilation syndrome, BMI of 50.75.  Again, tracheostomy tube  would remain in place as the patient exhibited weight loss.  AKI, creatinine 1.5 and monitored.  Attempts to keep the patient on the drier side to avoid any fluid overload.  The patient received weekly collaborative interdisciplinary team conferences to discuss estimated length of stay, family teaching, any barriers to discharge. She was ambulating 150 feet x2, rolling walker, supervision; modified independent, sit to stand; navigating stairs with supervision. Demonstrates moderate fall risk, noted score of 48/56 on Berg balance testing.  Discussed need for family training with her mother.  Oxygen saturations maintained during physical exercise.  She could gather her belongings for activities of daily living and homemaking.  She was discharged to home.  DISCHARGE MEDICATIONS:  Included Pepcid 20 mg p.o. b.i.d., Lasix 80 mg p.o. t.i.d., hydralazine 100 mg p.o. every 8 hours, Isordil 30 mg p.o. t.i.d., labetalol 300 mg p.o. b.i.d., multivitamin daily, potassium chloride 20 mEq p.o. b.i.d.  DIET:  Mechanical, soft, thin, liquids.  FOLLOWUP:  She would follow up with Dr. Delice [name] at the Outpatient Rehab Service office as needed, [name] [name], nurse practitioner, tracheostomy clinic, Dr. Redmond [name], ENT.     [name] [name], P.A.     DA/MEDQ  D:  07/05/2016  T:  07/05/2016  Job:  [id number]  cc:   [name] Graham, MD [name] Pain, MD [name] Miyamoto, MD [name] [name] [name] [name]

## 2016-07-05 NOTE — Plan of Care (Signed)
Problem: RH Balance Goal: LTG Patient will maintain dynamic standing balance (PT) LTG:  Patient will maintain dynamic standing balance with assistance during mobility activities (PT)  Outcome: Completed/Met Date Met: 07/05/16 With RW  Problem: RH Bed Mobility Goal: LTG Patient will perform bed mobility with assist (PT) LTG: Patient will perform bed mobility with assistance, with/without cues (PT).  Outcome: Completed/Met Date Met: 07/05/16 Pt mod I in room  Problem: RH Bed to Chair Transfers Goal: LTG Patient will perform bed/chair transfers w/assist (PT) LTG: Patient will perform bed/chair transfers with assistance, with/without cues (PT).  Outcome: Completed/Met Date Met: 07/05/16 With RW  Problem: RH Car Transfers Goal: LTG Patient will perform car transfers with assist (PT) LTG: Patient will perform car transfers with assistance (PT).  Outcome: Completed/Met Date Met: 07/05/16 With RW  Problem: RH Ambulation Goal: LTG Patient will ambulate in controlled environment (PT) LTG: Patient will ambulate in a controlled environment, # of feet with assistance (PT).  Outcome: Completed/Met Date Met: 07/05/16 150 ft with RW Goal: LTG Patient will ambulate in home environment (PT) LTG: Patient will ambulate in home environment, # of feet with assistance (PT).  Outcome: Completed/Met Date Met: 07/05/16 50 ft with RW  Problem: RH Stairs Goal: LTG Patient will ambulate up and down stairs w/assist (PT) LTG: Patient will ambulate up and down # of stairs with assistance (PT)  Outcome: Completed/Met Date Met: 07/05/16 4 steps + 4 steps with 1 rail

## 2016-07-05 NOTE — Discharge Instructions (Signed)
Inpatient Rehab Discharge Instructions  Shelva Hetzer Discharge date and time: No discharge date for patient encounter.   Activities/Precautions/ Functional Status: Activity: As tolerated Diet: Mechanical soft diabetic diet Wound Care: Routine skin checks Functional status:  ___ No restrictions     ___ Walk up steps independently ___ 24/7 supervision/assistance   ___ Walk up steps with assistance ___ Intermittent supervision/assistance  ___ Bathe/dress independently ___ Walk with walker     _x__ Bathe/dress with assistance ___ Walk Independently    ___ Shower independently ___ Walk with assistance    ___ Shower with assistance ___ No alcohol     ___ Return to work/school ________  Special Instructions: Follow-up Dr. Titus Mould at the tracheostomy clinic  Follow-up with pulmonary services for outpatient arrangements for CPAP  COMMUNITY REFERRALS UPON DISCHARGE:    OUTPATIENT REHAB:   PT & SP  Agency: [facility] Phone: [phone] FIRST APPOINTMENT 11/16 Thursday @ 3:00 PM WILL HAVE PT WILL WORK ON SPEECH FOLLOW UP   Medical Equipment/Items Ordered:WIDE ROLLING WALKER, TUB BENCH, TRACH SUPPLIES, SUCTION MACHINE, O2 FOR TRACH ALONG WITH HUMIDIFIED AIR  Agency/Supplier:[facility]    [phone]     My questions have been answered and I understand these instructions. I will adhere to these goals and the provided educational materials after my discharge from the hospital.  Patient/Caregiver Signature _______________________________ Date __________  Clinician Signature _______________________________________ Date __________  Please bring this form and your medication list with you to all your follow-up doctor's appointments.

## 2016-07-05 NOTE — Progress Notes (Signed)
Social Work Patient ID: [name] [name], female   DOB: 05-17-1981, 35 y.o.   MRN: [id number]  Have calling 10-15 home health agenices they either do not take [employer] insurance or take trach patients. Had to make an OP follow up visit.for pt. Have made referral to [facility] for OPPT and OPSPT follow up. Pt made aware and had no concerns it is close to where her Mom lives.

## 2016-07-05 NOTE — Progress Notes (Addendum)
Physical Therapy Session Note  Patient Details  Name: [name] [name] MRN: [id number] Date of Birth: 1980/10/19  Today's Date: 07/05/2016 PT Individual Time: 0908-1005 PT Individual Time Calculation (min): 57 min    Short Term Goals: Week 1:  PT Short Term Goal 1 (Week 1): = LTGs due to anticipated LOS  Skilled Therapeutic Interventions/Progress Updates:    Pt received in bathroom, and completed toileting tasks & hand hygiene with mod I. Pt denied c/o pain & pt's mother present for family education. Transported pt outside via w/c total assist for time management & energy conservation; pt performed car transfer in mother's SUV with supervision. Educated pt to not hold car door as it swings or to either have mother stabilize door; pt utilized handles in car to assist her during transfer. Back on unit educated pt's mother on how to assist pt with stair negotiation (positionong of caregiver relative to pt, hand placement on pt). Provided demonstration with pt negotiating 4 steps with single rail with min assist from PT; pt with increased ease descending stairs versus ascending. Pt & mother able to return demonstrate stair negotiation with good safety awareness; educated pt & mother on pt's potential need for increased assistance if she is fatigued. Pt ambulated 120 ft back to room with RW & Mod I, HR= 93bpm, SpO2 = 92% afterwards with pt recalling correct pursed lip breathing technique. Provided pt with energy conservation handouts and instructed her to ask questions and discuss with OT if needed. Pt & mother both voiced comfort with tasks completed during session on this date. Pt left in chair in room with mother & LCSW present.  Therapist observed slight skin breakdown on L heel & pt reported medical staff is aware & have been aware of this since she was admitted to hospital.   Therapy Documentation Precautions:  Precautions Precautions: Fall Precaution Comments: trach collar with  PSMV Restrictions Weight Bearing Restrictions: No   See Function Navigator for Current Functional Status.   Therapy/Group: Individual Therapy  Waunita Schooner 07/05/2016, 11:48 AM

## 2016-07-05 NOTE — Progress Notes (Signed)
[facility]ILIT[facility]     PROGRESS NOTE  Subjective/Complaints:  Pt sitting up in bed, eating breakfast.  She slept well overnight and had a good weekend for being in the hospital.  She has questions about returning to work.    ROS: Denies CP, SOB, N/V/D.  Objective: Vital Signs: Blood pressure 140/81, pulse 87, temperature 98.3 F (36.8 C), temperature source Oral, resp. rate 18, height _0  (1.651 m), weight (!) 154.7 kg (341 lb), SpO2 99 %, unknown if currently breastfeeding. No results found. No results for input(s): WBC, HGB, HCT, PLT in the last 72 hours. No results for input(s): NA, K, CL, GLUCOSE, BUN, CREATININE, CALCIUM in the last 72 hours.  Invalid input(s): CO CBG (last 3)   Recent Labs  07/04/16 1646 07/04/16 2033 07/05/16 0646  GLUCAP 108* 105* 102*    Wt Readings from Last 3 Encounters:  07/05/16 (!) 154.7 kg (341 lb)  06/29/16 86.6 kg (190 lb 14.7 oz)  12/12/13 (!) 167.8 kg (370 lb)    Physical Exam:  BP 140/81 (BP Location: Left Arm)   Pulse 87   Temp 98.3 F (36.8 C) (Oral)   Resp 18   Ht _1  (1.651 m)   Wt (!) 154.7 kg (341 lb)   LMP  (LMP Unknown)   SpO2 99%   BMI 56.75 kg/m  Constitutional: Morbidly obese. NAD. Vital signs reviewed. HENT: Normocephalic. +Trach Eyes: EOM are normal. No discharge. Neck: IJ site healed. Cardiovascular: RRR. No JVD. Respiratory: Clear to auscultation. Normal effort. GI: Soft. Bowel sounds are normal. She exhibits no distension.  Musculoskeletal: +Edema. No tenderness. Neurological: She is alert and oriented.  Dysphonia, continues to improve Follow simple commands.  Motor: 4+-5/5 throughout Skin: Ischemic vascular changes b/l lower extremities (unchanged) Psychiatric: She has a normal mood and affect. Her behavior is normal. Judgment and thought content normal.   Assessment/Plan: 1. Functional deficits secondary to CHF/respiratory failure which require 3+ hours per day of  interdisciplinary therapy in a comprehensive inpatient rehab setting. Physiatrist is providing close team supervision and 24 hour management of active medical problems listed below. Physiatrist and rehab team continue to assess barriers to discharge/monitor patient progress toward functional and medical goals.  Function:  Bathing Bathing position   Position: Wheelchair/chair at sink  Bathing parts Body parts bathed by patient: Right arm, Left arm, Chest, Front perineal area, Abdomen, Buttocks, Left upper leg, Right upper leg, Right lower leg, Left lower leg Body parts bathed by helper: Back  Bathing assist Assist Level: Assistive device, More than reasonable time Assistive Device Comment: long-handled sponge    Upper Body Dressing/Undressing Upper body dressing   What is the patient wearing?: Pull over shirt/dress, Bra Bra - Perfomed by patient: Thread/unthread right bra strap, Thread/unthread left bra strap, Hook/unhook bra (pull down sports bra) Bra - Perfomed by helper: Hook/unhook bra (pull down sports bra) Pull over shirt/dress - Perfomed by patient: Thread/unthread right sleeve, Thread/unthread left sleeve, Put head through opening, Pull shirt over trunk          Upper body assist Assist Level: More than reasonable time      Lower Body Dressing/Undressing Lower body dressing   What is the patient wearing?: Socks, Pants, Underwear Underwear - Performed by patient: Thread/unthread right underwear leg, Thread/unthread left underwear leg, Pull underwear up/down Underwear - Performed by helper: Pull underwear up/down Pants- Performed by patient: Pull pants up/down, Thread/unthread right pants leg, Thread/unthread left pants leg, Fasten/unfasten pants Pants- Performed  by helper: Thread/unthread right pants leg, Thread/unthread left pants leg   Non-skid slipper socks- Performed by helper: Don/doff right sock, Don/doff left sock Socks - Performed by patient: Don/doff right sock,  Don/doff left sock Socks - Performed by helper: Don/doff left sock, Don/doff right sock              Lower body assist Assist for lower body dressing: More than reasonable time, Assistive device Assistive Device Comment: reacher, sock-aid    Toileting Toileting   Toileting steps completed by patient: Adjust clothing prior to toileting, Performs perineal hygiene, Adjust clothing after toileting Toileting steps completed by helper: Adjust clothing prior to toileting, Performs perineal hygiene, Adjust clothing after toileting (per Gilman Buttner, NT) Toileting Assistive Devices: Grab bar or rail  Toileting assist Assist level: More than reasonable time   Transfers Chair/bed transfer   Chair/bed transfer method: Ambulatory Chair/bed transfer assist level: Supervision or verbal cues Chair/bed transfer assistive device: Armrests, Medical sales representative     Max distance: 150 ft Assist level: Supervision or verbal cues   Wheelchair          Cognition Comprehension Comprehension assist level: Follows complex conversation/direction with no assist  Expression Expression assist level: Expresses complex ideas: With no assist  Social Interaction Social Interaction assist level: Interacts appropriately with others - No medications needed.  Problem Solving Problem solving assist level: Solves complex problems: Recognizes & self-corrects  Memory Memory assist level: Complete Independence: No helper      Medical Problem List and Plan: 1.  Debilitation secondary to CHF/respiratory failure  Cont CIR 2.  DVT Prophylaxis/Anticoagulation: Subcutaneous Lovenox. Monitor platelet counts and any signs of bleeding.   Vascular studies negative DVT 11/8 3. Pain Management: Tylenol as needed 4. 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 No current plans for decannulation. PMV   Will discuss weaning supplemental O2 5. Neuropsych: This patient is capable of  making decisions on her own behalf. 6. Skin/Wound Care: Routine skin checks 7. Fluids/Electrolytes/Nutrition: Routine I&O 8. Dysphagia. Advanced to D3 thin. Follow speech therapy. Monitor hydration closely. Attempt to keep patient on the drier side 9. Morbid obesity/hypoventilation syndrome. BMI 50.75.   Plans to keep tracheostomy in place until she has a working CPAP machine in her possession as an outpatient 10. Hypertension/diastolic congestive heart failure:. Lasix 80 mg 3 times a day, Isordil 30 mg 3 times a day, labetalol 300 mg twice a day. Monitor with increased mobility  Keep on drier side             Follow daily weights Filed Weights   07/02/16 0500 07/03/16 0408 07/05/16 0549  Weight: (!) 152 kg (335 lb 3 oz) (!) 150.6 kg (332 lb 1.6 oz) (!) 154.7 kg (341 lb)   Stable  BP relatively controlled 11/13 11. Diabetes mellitus type 2  SSI  Monitor with increased activity  Stable 12. AKI  Cr. 1.50 on 11/8  Will attempt to keep relatively dry, which will increase BUN/CR  Will cont to monitor  Labs ordered 13. Hypoalbuminemia . Continue prostat   LOS (Days) 6 A FACE TO FACE EVALUATION WAS PERFORMED  Laconya Clere Lorie Phenix 07/05/2016 9:08 AM

## 2016-07-05 NOTE — Discharge Summary (Signed)
Discharge summary job [phone]

## 2016-07-05 NOTE — Progress Notes (Signed)
Speech Language Pathology Discharge Summary and Final Treatment Note  Patient Details  Name: [name] [name] MRN: [id number] Date of Birth: 01-Feb-1981  Today's Date: 07/05/2016 SLP Individual Time: 9935-7017 SLP Individual Time Calculation (min): 54 min  Skilled Therapeutic Interventions:   Skilled treatment session focused on addressing speech and swallow goals. SLP facilitated session by providing skilled observation of patient consuming regular textures and thin liquids via cup with slightly increased time for mastication and no observed s/s of aspiration.  As a result, recommend diet upgrade and orders were modified to reflect change in care plan.  SLP also facilitated session with education regarding continued need for no straws and medications whole in puree for home management.  Mother and patient verbalized understanding of information.  SLP also wrapped up education for PMSV use and cleaning procedures; patient and mother asked appropriate questions.  It is recommended that patient follow up with the outpatient trach clinic.  Additionally, patient fully intelligible throughout session; however, vocal quality marked by hoarseness and as a result, SLP provided education and teach back regarding vocal hygiene procedures.  Again patient and mother verbalized understanding and were provided with a handout for carryover.  Recommended that if vocal quality changes persist after a couple weeks recommend also considering an ENT follow up appointment.  All goals met and education complete.  As a result, no skilled SLP services are warranted at this time.     Patient has met 4 of 4 long term goals.  Patient to discharge at overall Modified Independent level.  Reasons goals not met: n/a   Clinical Impression/Discharge Summary:    Patient has made functional gains during this rehab admission and has met 4 out of 4 long term goals due to improved functional abilities.  Patient is currently an overall Mod I  for communication and requires no assist for utilization of swallowing compensatory strategies to minimize overt s/s of aspiration with regular textures and thin liquids with no straws and medications whole in puree.  Patient and family education has been completed and patient will discharge home with 24 hour supervision. It is recommended that patient follow up with outpatient trach clinic as well as consider ENT to maximize vocal quality.     Care Partner:  Caregiver Able to Provide Assistance: Yes  Type of Caregiver Assistance:  (trach and PMSV)  Recommendation:  None;24 hour supervision/assistance;Other (comment) (OP trach clinic and ENT follow up in a few weeks if vocal quality does not improve)     Equipment: has PMSV   Reasons for discharge: Treatment goals met;Discharged from hospital   Patient/Family Agrees with Progress Made and Goals Achieved: Yes   Function:  Eating Eating   Modified Consistency Diet: No Eating Assist Level: Swallowing techniques: self managed           Cognition Comprehension Comprehension assist level: Follows complex conversation/direction with no assist  Expression   Expression assist level: Expresses complex ideas: With no assist  Social Interaction Social Interaction assist level: Interacts appropriately with others - No medications needed.  Problem Solving Problem solving assist level: Solves complex problems: Recognizes & self-corrects  Memory Memory assist level: Complete Independence: No helper   Carmelia Roller., CCC-SLP [phone]  [name] [name] 07/05/2016, 4:32 PM

## 2016-07-05 NOTE — Progress Notes (Signed)
Occupational Therapy Discharge Summary  Patient Details  Name: [name] [name] MRN: [id number] Date of Birth: Nov 02, 1980  Today's Date: 07/05/2016  Session 1 OT Individual Time: 0800-0900 OT Individual Time Calculation (min): 60 min   Session 2 OT Individual Time: 1115-1200 OT Individual Time Calculation (min): 45 min    Patient has met 9 of 9 long term goals due to improved activity tolerance, improved balance and ability to compensate for deficits.  Patient to discharge at overall Modified Independent level.  Patient's care partner is independent to provide the necessary physical assistance for some IADLs and childcare at discharge.    Reasons goals not met: n/a  Recommendation:  Patient will benefit from ongoing skilled OT services in home health setting to continue to advance functional skills in the area of BADL and iADL.  Equipment: heavy duty RW, and bariatric tub/transfer bench  Reasons for discharge: treatment goals met and discharge from hospital  Patient/family agrees with progress made and goals achieved: Yes  Skilled Therapeutic Interventions: Session 1 1:1 OT session focused on increased independence with bathing/dressing and activity tolerance. Patient completed ADLs with Mod I and increased time. Pt utilized energy conservation techniques and adaptive equipment appropriately for successful completion of self-care tasks. Pt's SpO2 remained above 90% during OT session. Patient made Mod I in the room today and left seated in chair at end of session with needs met.   Session 2 OT session focused on family education, home management, and simple meal prep. Pt ambulated to kitchen with RW and was given walker bag. Pt educated on proper walker placement during meal prep. Pt-therapist collaboration completed regarding kitchen modifications during completion of simulated meal prep. Pt then completed light housekeeping in therapy apartment including bed making task. Pt required  cues for deep breathing techniques throughout session, SpO2 fluctuated between 85-95 depending on atciivty demand. Pt returned to room and OT discussed home and bathroom set-up for ADL participation and discussed self-care with pt and mother. Pt left seated in chair with needs met and family present.   OT Discharge Precautions/Restrictions  Precautions Precautions: Fall Precaution Comments: trach collar with PSMV Vital Signs Therapy Vitals Pulse Rate: 91 Resp: 20 Patient Position (if appropriate): Sitting Oxygen Therapy SpO2: 92 % O2 Device: Not Delivered Pain  nine/denies pain ADL ADL Equipment Provided: Sock aid, Reacher Grooming: Modified independent Upper Body Bathing: Modified independent Lower Body Bathing: Modified independent Upper Body Dressing: Modified independent (Device) Lower Body Dressing: Modified independent Toileting: Modified independent Toilet Transfer: Modified independent Science writer: Energy manager Method: Optometrist: Radio broadcast assistant ADL Comments: see functional navigator Cognition Overall Cognitive Status: Within Functional Limits for tasks assessed Arousal/Alertness: Awake/alert Orientation Level: Oriented X4 Memory: Appears intact Safety/Judgment: Appears intact Sensation Sensation Light Touch: Appears Intact Stereognosis: Appears Intact Hot/Cold: Appears Intact Proprioception: Appears Intact Coordination Gross Motor Movements are Fluid and Coordinated: Yes Fine Motor Movements are Fluid and Coordinated: Yes Motor  Motor Motor: Within Functional Limits Balance Dynamic Standing Balance Dynamic Standing - Balance Support: During functional activity Dynamic Standing - Level of Assistance: 6: Modified independent (Device/Increase time) Extremity/Trunk Assessment RUE Assessment RUE Assessment: Within Functional Limits LUE Assessment LUE Assessment: Within Functional Limits  See Function  Navigator for Current Functional Status.  Daneen Schick Mehki Klumpp 07/05/2016, 12:41 PM

## 2016-07-06 LAB — CBC
HCT: 41 % (ref 36.0–46.0)
Hemoglobin: 12.9 g/dL (ref 12.0–15.0)
MCH: 25.2 pg — ABNORMAL LOW (ref 26.0–34.0)
MCHC: 31.5 g/dL (ref 30.0–36.0)
MCV: 80.2 fL (ref 78.0–100.0)
Platelets: 169 10*3/uL (ref 150–400)
RBC: 5.11 MIL/uL (ref 3.87–5.11)
RDW: 23.3 % — ABNORMAL HIGH (ref 11.5–15.5)
WBC: 5.5 10*3/uL (ref 4.0–10.5)

## 2016-07-06 LAB — CREATININE, SERUM
Creatinine, Ser: 1.43 mg/dL — ABNORMAL HIGH (ref 0.44–1.00)
GFR calc Af Amer: 54 mL/min — ABNORMAL LOW (ref >60)
GFR calc non Af Amer: 46 mL/min — ABNORMAL LOW (ref >60)

## 2016-07-06 LAB — GLUCOSE, CAPILLARY
Glucose-Capillary: 104 mg/dL — ABNORMAL HIGH (ref 65–99)
Glucose-Capillary: 121 mg/dL — ABNORMAL HIGH (ref 65–99)

## 2016-07-06 MED ORDER — ISOSORBIDE DINITRATE 30 MG PO TABS: 30.0000 mg | ORAL_TABLET | Freq: Three times a day (TID) | ORAL | 0 refills | Status: DC

## 2016-07-06 MED ORDER — POTASSIUM CHLORIDE CRYS ER 20 MEQ PO TBCR
20.0000 meq | EXTENDED_RELEASE_TABLET | Freq: Two times a day (BID) | ORAL | 0 refills | Status: DC
Start: 2016-07-06 — End: 2016-08-04

## 2016-07-06 MED ORDER — HYDRALAZINE HCL 100 MG PO TABS: 100.0000 mg | ORAL_TABLET | Freq: Three times a day (TID) | ORAL | 0 refills | Status: DC

## 2016-07-06 MED ORDER — FUROSEMIDE 80 MG PO TABS
80.0000 mg | ORAL_TABLET | Freq: Three times a day (TID) | ORAL | 0 refills | Status: DC
Start: 2016-07-06 — End: 2016-08-04

## 2016-07-06 MED ORDER — FAMOTIDINE 20 MG PO TABS: 20.0000 mg | ORAL_TABLET | Freq: Two times a day (BID) | ORAL | 0 refills | Status: DC

## 2016-07-06 MED ORDER — LABETALOL HCL 300 MG PO TABS: 300.0000 mg | ORAL_TABLET | Freq: Two times a day (BID) | ORAL | 0 refills | Status: DC

## 2016-07-06 NOTE — Progress Notes (Signed)
Social Work  Discharge Note  The overall goal for the admission was met for:   Discharge location: Yes-HOME TO MOM'S WHERE A FAMILY MEMBER WILL BE THERE WITH HER-24 HR  Length of Stay: Yes-7 DAYS  Discharge activity level: Yes-MOD/I LEVEL  Home/community participation: Yes  Services provided included: MD, RD, PT, OT, SLP, RN, CM, TR, Pharmacy and SW  Financial Services: Private Insurance: [employer]  Follow-up services arranged: Outpatient: [facility]-OPPT & SP- 11/15-3:00 PM, DME: [facility]-WIDE ROLLING WALKER, TUB BENCH, HOME O2, TRACH SUPPLIES, SUCTION MACHINE, and Patient/Family has no preference for HH/DME agencies ADDED PULSE OX MACHINE  Comments (or additional information):MOM HERE YESTERDAY TO GO THROUGH FAMILY TRAINING BOTH FEEL COMFORTABLE WITH THE PLAN AND DISCHARGE TODAY.  Patient/Family verbalized understanding of follow-up arrangements: Yes  Individual responsible for coordination of the follow-up plan: SELF & SHEILA-MOM  Confirmed correct DME delivered: Elease Hashimoto 07/06/2016    Elease Hashimoto

## 2016-07-06 NOTE — Progress Notes (Signed)
Patient discharged to home with mother and family at 104 after receiving discharge instructions from D. Angiuilli,PA. Patient and mother verbalized understanding of discharge instructions. Patient discharged with all belongings, and # 6 XLT trach intact. Additional trach sent home with patient.

## 2016-07-06 NOTE — Progress Notes (Signed)
[facility] PH[facility] MEDICINE & REHABILITATION     PROGRESS NOTE  Subjective/Complaints:  Pt seen sitting up in bed this AM.  She slept well overnight.  She has questions again about returning to work.  She is during well without )2 during the day.  She is ready for discharge.   ROS: Denies CP, SOB, N/V/D.  Objective: Vital Signs: Blood pressure 140/80, pulse 88, temperature 98.3 F (36.8 C), temperature source Oral, resp. rate 18, height _0  (1.651 m), weight (!) 155 kg (341 lb 11.4 oz), SpO2 99 %, unknown if currently breastfeeding. No results found.  Recent Labs  07/06/16 0521  WBC 5.5  HGB 12.9  HCT 41.0  PLT 169    Recent Labs  07/05/16 1028 07/06/16 0521  NA 135  --   K 3.9  --   CL 91*  --   GLUCOSE 111*  --   BUN 39*  --   CREATININE 1.69* 1.43*  CALCIUM 9.7  --    CBG (last 3)   Recent Labs  07/05/16 2342 07/06/16 0420 07/06/16 0803  GLUCAP 106* 104* 121*    Wt Readings from Last 3 Encounters:  07/06/16 (!) 155 kg (341 lb 11.4 oz)  06/29/16 86.6 kg (190 lb 14.7 oz)  12/12/13 (!) 167.8 kg (370 lb)    Physical Exam:  BP 140/80   Pulse 88   Temp 98.3 F (36.8 C) (Oral)   Resp 18   Ht _1  (1.651 m)   Wt (!) 155 kg (341 lb 11.4 oz)   LMP  (LMP Unknown)   SpO2 99%   BMI 56.86 kg/m  Constitutional: Morbidly obese. NAD. Vital signs reviewed. HENT: Normocephalic. +Trach Eyes: EOM are normal. No discharge. Neck: IJ site healed. Cardiovascular: RRR. No JVD. Respiratory: Clear to auscultation. Normal effort. GI: Soft. Bowel sounds are normal. She exhibits no distension.  Musculoskeletal: +Edema. No tenderness. Neurological: She is alert and oriented.  Dysphonia, continues to improve Follow simple commands.  Motor: 5/5 throughout Skin: Ischemic vascular changes b/l lower extremities (stable) Psychiatric: She has a normal mood and affect. Her behavior is normal. Judgment and thought content normal.   Assessment/Plan: 1. Functional deficits  secondary to CHF/respiratory failure which require 3+ hours per day of interdisciplinary therapy in a comprehensive inpatient rehab setting. Physiatrist is providing close team supervision and 24 hour management of active medical problems listed below. Physiatrist and rehab team continue to assess barriers to discharge/monitor patient progress toward functional and medical goals.  Function:  Bathing Bathing position   Position: Wheelchair/chair at sink  Bathing parts Body parts bathed by patient: Right arm, Left arm, Chest, Front perineal area, Abdomen, Buttocks, Left upper leg, Right upper leg, Right lower leg, Left lower leg Body parts bathed by helper: Back  Bathing assist Assist Level: Assistive device, More than reasonable time Assistive Device Comment: long-handled sponge    Upper Body Dressing/Undressing Upper body dressing   What is the patient wearing?: Pull over shirt/dress, Bra Bra - Perfomed by patient: Thread/unthread right bra strap, Thread/unthread left bra strap, Hook/unhook bra (pull down sports bra) Bra - Perfomed by helper: Hook/unhook bra (pull down sports bra) Pull over shirt/dress - Perfomed by patient: Thread/unthread right sleeve, Thread/unthread left sleeve, Put head through opening, Pull shirt over trunk          Upper body assist Assist Level: More than reasonable time      Lower Body Dressing/Undressing Lower body dressing   What is the patient wearing?:  Socks, Pants, Scientist, water quality - Performed by patient: Thread/unthread right underwear leg, Thread/unthread left underwear leg, Pull underwear up/down Underwear - Performed by helper: Pull underwear up/down Pants- Performed by patient: Pull pants up/down, Thread/unthread right pants leg, Thread/unthread left pants leg, Fasten/unfasten pants Pants- Performed by helper: Thread/unthread right pants leg, Thread/unthread left pants leg   Non-skid slipper socks- Performed by helper: Don/doff right sock,  Don/doff left sock Socks - Performed by patient: Don/doff right sock, Don/doff left sock Socks - Performed by helper: Don/doff left sock, Don/doff right sock              Lower body assist Assist for lower body dressing: More than reasonable time, Assistive device Assistive Device Comment: reacher, sock-aid    Toileting Toileting   Toileting steps completed by patient: Adjust clothing prior to toileting, Performs perineal hygiene, Adjust clothing after toileting Toileting steps completed by helper: Adjust clothing prior to toileting, Performs perineal hygiene, Adjust clothing after toileting (per Gilman Buttner, NT) Toileting Assistive Devices: Grab bar or rail  Toileting assist Assist level: No help/no cues   Transfers Chair/bed transfer   Chair/bed transfer method: Ambulatory Chair/bed transfer assist level: Supervision or verbal cues Chair/bed transfer assistive device: Armrests, Medical sales representative     Max distance: 120 ft Assist level: No help, No cues, assistive device, takes more than a reasonable amount of time   Wheelchair          Cognition Comprehension Comprehension assist level: Follows complex conversation/direction with no assist  Expression Expression assist level: Expresses complex ideas: With no assist  Social Interaction Social Interaction assist level: Interacts appropriately with others - No medications needed.  Problem Solving Problem solving assist level: Solves complex problems: Recognizes & self-corrects  Memory Memory assist level: Complete Independence: No helper      Medical Problem List and Plan: 1.  Debilitation secondary to CHF/respiratory failure  D/c today  Will see pt for transitional care management in 1-2 weeks 2.  DVT Prophylaxis/Anticoagulation: Subcutaneous Lovenox. Monitor platelet counts and any signs of bleeding.   Vascular studies negative DVT 11/8 3. Pain Management: Tylenol as needed 4. Tracheostomy  06/11/2016. Currently with a #6 proximal XLT trach. No current plans for decannulation. PMV   Supplemental O2 only qhs  Pt will need to follow up with trach clinic regarding further management 5. Neuropsych: This patient is capable of making decisions on her own behalf. 6. Skin/Wound Care: Routine skin checks 7. Fluids/Electrolytes/Nutrition: Routine I&O 8. Dysphagia. Advanced to D3 thin. Follow speech therapy. Monitor hydration closely. Attempt to keep patient on the drier side 9. Morbid obesity/hypoventilation syndrome. BMI 50.75.   Plans to keep tracheostomy in place until she has a working CPAP machine in her possession as an outpatient 10. Hypertension/diastolic congestive heart failure:. Lasix 80 mg 3 times a day, Isordil 30 mg 3 times a day, labetalol 300 mg twice a day. Monitor with increased mobility  Keep on drier side             Follow daily weights Filed Weights   07/03/16 0408 07/05/16 0549 07/06/16 0423  Weight: (!) 150.6 kg (332 lb 1.6 oz) (!) 154.7 kg (341 lb) (!) 155 kg (341 lb 11.4 oz)   Stable 11/14  BP relatively controlled 11/14 11. Diabetes mellitus type 2  SSI  Monitor with increased activity  Stable 12. AKI  Cr. 1.43 on 11/14 (slightly improved)  Will attempt to keep relatively dry, which will increase BUN/CR  Will  cont to monitor 13. Hypoalbuminemia . Continue prostat   LOS (Days) 7 A FACE TO FACE EVALUATION WAS PERFORMED  [name] [name] 07/06/2016 8:48 AM

## 2016-07-07 ENCOUNTER — Telehealth: Payer: Self-pay

## 2016-07-07 NOTE — Telephone Encounter (Signed)
  Transitional Care call-Lexia Lehane    1. Are you/is patient experiencing any problems since coming home? No 2. Are there any questions regarding any aspect of care? No 3. Are there any questions regarding medications administration/dosing? No Are meds being taken as prescribed? Yes  4. Have there been any falls? No 5. Has Home Health been to the house and/or have they contacted you? If not, have you tried to contact them? Can we help you contact them? No 6. Are bowels and bladder emptying properly? Yes Are there any unexpected incontinence issues? I 7. Any fevers, problems with breathing, unexpected pain? No 8. Are there any skin problems or new areas of breakdown? No 9. Has the patient/family member arranged specialty MD follow up (ie cardiology/neurology/renal/surgical/etc)?  Yes Can we help arrange? No 10. Does the patient need any other services or support that we can help arrange? No 11. Are caregivers following through as expected in assisting the patient? Yes 12. Has the patient quit smoking, drinking alcohol, or using drugs as recommended? Patient doesn't use illegal drugs, alcohol or tobacco products  Appointment time Confirmed

## 2016-07-07 NOTE — Telephone Encounter (Signed)
First attempt to contact [name] [name] for transitional care call.

## 2016-07-12 ENCOUNTER — Encounter: Payer: Self-pay | Admitting: Physical Medicine & Rehabilitation

## 2016-07-12 ENCOUNTER — Encounter
Payer: PRIVATE HEALTH INSURANCE | Attending: Physical Medicine & Rehabilitation | Admitting: Physical Medicine & Rehabilitation

## 2016-07-12 VITALS — BP 184/107 | HR 85 | Resp 16

## 2016-07-12 DIAGNOSIS — R1310 Dysphagia, unspecified: Secondary | ICD-10-CM | POA: Diagnosis not present

## 2016-07-12 DIAGNOSIS — Z5189 Encounter for other specified aftercare: Secondary | ICD-10-CM | POA: Insufficient documentation

## 2016-07-12 DIAGNOSIS — I5031 Acute diastolic (congestive) heart failure: Secondary | ICD-10-CM | POA: Diagnosis not present

## 2016-07-12 DIAGNOSIS — R5381 Other malaise: Secondary | ICD-10-CM | POA: Diagnosis not present

## 2016-07-12 DIAGNOSIS — E119 Type 2 diabetes mellitus without complications: Secondary | ICD-10-CM | POA: Diagnosis not present

## 2016-07-12 DIAGNOSIS — R000 Tachycardia, unspecified: Secondary | ICD-10-CM | POA: Diagnosis not present

## 2016-07-12 DIAGNOSIS — N179 Acute kidney failure, unspecified: Secondary | ICD-10-CM | POA: Insufficient documentation

## 2016-07-12 DIAGNOSIS — Z930 Tracheostomy status: Secondary | ICD-10-CM | POA: Diagnosis not present

## 2016-07-12 DIAGNOSIS — I10 Essential (primary) hypertension: Secondary | ICD-10-CM

## 2016-07-12 DIAGNOSIS — I110 Hypertensive heart disease with heart failure: Secondary | ICD-10-CM | POA: Insufficient documentation

## 2016-07-12 DIAGNOSIS — J9690 Respiratory failure, unspecified, unspecified whether with hypoxia or hypercapnia: Secondary | ICD-10-CM | POA: Insufficient documentation

## 2016-07-12 DIAGNOSIS — I5030 Unspecified diastolic (congestive) heart failure: Secondary | ICD-10-CM | POA: Insufficient documentation

## 2016-07-12 NOTE — Progress Notes (Signed)
Subjective:    Patient ID: [name] [name], female    DOB: July 13, 1981, 35 y.o.   MRN: [id number]      Transitional Care Visit HPI   [name] [name] is here in follow up of her inpatient rehab stay. She has been home about a week. She still has a #6 trach with PMV. Sleep is good. She denies SOB. She's had an occasoinal cough. Her mobility has improved. She is no longer using the walker. She started outpt therapy in [place] last Thursday.   She denies pain. Bowels and bladder are functioning well. She reports peeling skin in her hands and feet. She has used Aveeno to help.   Pain Inventory Average Pain 0 Pain Right Now 0 My pain is no pain  In the last 24 hours, has pain interfered with the following? General activity 0 Relation with others 0 Enjoyment of life 0 What TIME of day is your pain at its worst? no pain Sleep (in general) no pain  Pain is worse with: no pain Pain improves with: no pain Relief from Meds: no pain  Mobility walk without assistance use a walker ability to climb steps?  yes do you drive?  yes  Function employed # of hrs/week 40/ on short term disability  Neuro/Psych No problems in this area  Prior Studies Any changes since last visit?  no  Physicians involved in your care Any changes since last visit?  no   Family History  Problem Relation Age of Onset  . Hypertension Mother   . Diabetes Other    Social History   Social History  . Marital status: Single    Spouse name: N/A  . Number of children: N/A  . Years of education: N/A   Occupational History  . 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 Smoking status: Never Smoker  . Smokeless tobacco: Never Used  . Alcohol use No  . Drug use: No  . Sexual activity: Not Asked   Other Topics Concern  . 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 TRACHEOSTOMY TUBE PLACEMENT N/A 06/11/2016   Procedure: TRACHEOSTOMY;  Surgeon: Melida Quitter, MD;  Location: [facility];  Service: ENT;  Laterality: N/A;   Past Medical History:  Diagnosis Date  . Diabetes mellitus without complication (HCC)    GDM  . Gestational diabetes mellitus in pregnancy 05/24/2013  . HTN in pregnancy, chronic 05/24/2013  . Hypertension   . Morbid obesity ([place])   . Sinus tachycardia    BP (!) 184/107   Pulse 85   Resp 16   LMP  (LMP Unknown)   SpO2 92%   Opioid Risk Score:   Fall Risk Score:  `1  Depression screen PHQ 2/9  Depression screen PHQ 2/9 07/12/2016  Decreased Interest 0  Down, Depressed, Hopeless 0  PHQ - 2 Score 0  Altered sleeping 0  Tired, decreased energy 0  Change in appetite 0  Feeling bad or failure about yourself  0  Trouble concentrating 0  Moving slowly or fidgety/restless 0  Suicidal thoughts 0  PHQ-9 Score 0    Review of Systems  Constitutional: Positive for unexpected weight change.  HENT: Negative.   Eyes: Negative.   Respiratory: Positive for apnea and shortness of breath.  Cardiovascular: Negative.   Gastrointestinal: Negative.   Endocrine: Negative.   Genitourinary: Negative.   Musculoskeletal: Negative.   Allergic/Immunologic: Negative.   Neurological: Negative.   Hematological: Negative.   Psychiatric/Behavioral: Negative.   All other systems reviewed and are negative.      Objective:   Physical Exam  HENT: Normocephalic. #6 XL shiley trach with PMV Eyes: EOMI Neck: IJ site healed. Cardiovascular: RR Respiratory: CTA bilaterally. GI: Soft.  Musculoskeletal: trace le edema, no ue edema Neurological: She is alertand oriented.  Dysphonia is improving Normal insight and awareness.  Motor: 5/5 throughout. Balance normal.  Skin: Ischemic vascular changes b/l lower extremities (stable) Psychiatric: She has a normal mood and affect. Her behavior is normal. Judgmentand thought  contentnormal.       Assessment & Plan:  1. Debilitationsecondary to CHF/respiratory failure            -continue outpt PT  -HEP 3. Pain Management: Tylenol as needed 4. 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 Currently with a #6 proximal XLT trach withPMV              Supplemental O2 only qhs             has trach clinic visit next week.  5. Neuropsych: This patient iscapable of making decisions on herown behalf. 6. Skin/Wound Care: [place] for dry skin. Likely some residual from edema. 7. Fluids/Electrolytes/Nutrition: Routine I&O 8.Dysphagia. ---on regular diet currently 9.Morbid obesity/hypoventilation syndrome. BMI 50.75.             keep tracheostomy in place until she has a working CPAPmachine in her possession as an outpatient  -trach clinic visit next week. 10.Hypertension/diastolic congestive heart failure:.Lasix 80 mg 3 times a day, Isordil 30 mg 3 times a day, labetalol 300 mg twice a day.   -needs to purchase a BP cuff/machine for home to check daily  -had been well controlled upon discharge from rehab  -adjust regimen as needed.  -is due two of her medications right now  14. AKI:  -check bmet today.

## 2016-07-12 NOTE — Patient Instructions (Signed)
PLEASE PURCHASE A BLOOD PRESSURE CUFF. YOU NEED TO BE CHECKING BP DAILY!   PLEASE CALL ME WITH ANY PROBLEMS OR QUESTIONS ([phone])   HAPPY THANKSGIVING!!!!

## 2016-07-13 LAB — BASIC METABOLIC PANEL
BUN/Creatinine Ratio: 23 (ref 9–23)
BUN: 26 mg/dL — ABNORMAL HIGH (ref 6–20)
CO2: 24 mmol/L (ref 18–29)
Calcium: 10 mg/dL (ref 8.7–10.2)
Chloride: 92 mmol/L — ABNORMAL LOW (ref 96–106)
Creatinine, Ser: 1.13 mg/dL — ABNORMAL HIGH (ref 0.57–1.00)
GFR calc Af Amer: 72 mL/min/{1.73_m2} (ref >59)
GFR calc non Af Amer: 63 mL/min/{1.73_m2} (ref >59)
Glucose: 103 mg/dL — ABNORMAL HIGH (ref 65–99)
Potassium: 4.4 mmol/L (ref 3.5–5.2)
Sodium: 138 mmol/L (ref 134–144)

## 2016-07-14 ENCOUNTER — Telehealth: Payer: Self-pay | Admitting: Physical Medicine & Rehabilitation

## 2016-07-14 NOTE — Telephone Encounter (Signed)
Please let [name] [name] know that her labs are improve/stable. thanks

## 2016-07-14 NOTE — Telephone Encounter (Signed)
Tequlia notified.

## 2016-07-19 ENCOUNTER — Telehealth: Payer: Self-pay | Admitting: *Deleted

## 2016-07-19 NOTE — Telephone Encounter (Signed)
I spoke with Breklyn and she sees her trach doctors on Wednesday and she will speak with them.

## 2016-07-19 NOTE — Telephone Encounter (Signed)
I'm not sure what cold medicine she is calling about.  But last I saw her, she had a trach and was being followed by pulmonology.  If respiratory infection is a concern, she should consult with Pulmonology as she may need further workup.  If she is asking about tylenol PM or equivalent, she should be okay to take that.  Thanks.

## 2016-07-19 NOTE — Telephone Encounter (Signed)
[name] [name] called twice about medication for a cold OTC that she can take.  Our office was closed and no calls received until Monday 07/19/16.  There is no PCP listed. Please advise.

## 2016-07-21 ENCOUNTER — Ambulatory Visit (HOSPITAL_COMMUNITY)
Admission: RE | Admit: 2016-07-21 | Discharge: 2016-07-21 | Disposition: A | Discharge status: Home / Self Care | Payer: PRIVATE HEALTH INSURANCE | Source: Ambulatory Visit | Attending: Acute Care | Admitting: Acute Care

## 2016-07-21 DIAGNOSIS — J9622 Acute and chronic respiratory failure with hypercapnia: Secondary | ICD-10-CM

## 2016-07-21 DIAGNOSIS — G4733 Obstructive sleep apnea (adult) (pediatric): Secondary | ICD-10-CM

## 2016-07-21 DIAGNOSIS — E669 Obesity, unspecified: Secondary | ICD-10-CM | POA: Insufficient documentation

## 2016-07-21 DIAGNOSIS — Z430 Encounter for attention to tracheostomy: Secondary | ICD-10-CM | POA: Insufficient documentation

## 2016-07-21 DIAGNOSIS — I5032 Chronic diastolic (congestive) heart failure: Secondary | ICD-10-CM | POA: Diagnosis not present

## 2016-07-21 DIAGNOSIS — Z930 Tracheostomy status: Secondary | ICD-10-CM

## 2016-07-21 DIAGNOSIS — I5030 Unspecified diastolic (congestive) heart failure: Secondary | ICD-10-CM | POA: Diagnosis not present

## 2016-07-21 NOTE — Progress Notes (Signed)
Tracheostomy Procedure Note  [name] [name] [id number] 1981-01-13  Pre Procedure Tracheostomy Information  Trach Brand: Shiley Size: 6.0 Style: Proximal and Uncuffed Secured by: Velcro   Procedure: education/ evaluation      Post Procedure Evaluation:  Vital signs:blood pressure 173/95, pulse 88, respirations 18 and pulse oximetry 98 % Patients current condition: stable Complications: No apparent complications Trach site exam: clean, dry Wound care done: dry Patient did tolerate procedure well.   Education: Patient instructed on how to change trach ties.  Mirror given to help facilitate.  Prescription needs: [name], NP is setting up an appointment for sleep medicine.    Additional needs: none

## 2016-07-21 NOTE — Progress Notes (Signed)
   Subjective:  ROV   Patient ID: [name] [name], female    DOB: 1981-04-13, 35 y.o.   MRN: [id number]  HPI This is a 35 year old female who presents today for follow up in regards to her tracheostomy. The last time I saw [name] [name] was when she was hospitalized in Early November for acute on chronic hypercarbic respiratory failure in the setting of her essentially un-treated OSA, OHS. She failed weaning attempts during that time and eventually required tracheostomy. Currently she is home w/ trach. She reports today for routine trach care as well as hoping that perhaps her trach could be removed.    Review of Systems  Constitutional: Negative.   HENT: Negative.   Eyes: Negative.   Respiratory: Negative.   Cardiovascular: Negative.   Gastrointestinal: Negative.   Endocrine: Negative.   Genitourinary: Negative.   Musculoskeletal: Negative.   Allergic/Immunologic: Negative.   Neurological: Negative.   Hematological: Negative.   Psychiatric/Behavioral: Negative.        Vital signs:blood pressure 173/95, pulse 88, respirations 18 and pulse oximetry 98 % Objective:   Physical Exam  Constitutional: She is oriented to person, place, and time. She appears well-developed and well-nourished. No distress.  HENT:  Head: Normocephalic and atraumatic.  Mouth/Throat: Oropharynx is clear and moist. No oropharyngeal exudate.  Eyes: Conjunctivae and EOM are normal. Pupils are equal, round, and reactive to light.  Neck: Normal range of motion.  Trach site unremarkable   Cardiovascular: Normal rate and regular rhythm.  Exam reveals no gallop and no friction rub.   No murmur heard. Pulmonary/Chest: Effort normal and breath sounds normal. No respiratory distress. She has no wheezes.  Abdominal: Soft. Bowel sounds are normal.  Musculoskeletal: Normal range of motion. She exhibits no edema or tenderness.  Neurological: She is alert and oriented to person, place, and time.  Skin: Skin is warm and dry.  She is not diaphoretic.  Psychiatric: She has a normal mood and affect.    Trach BrandCristy Hilts Size: 6.0 Style: Proximal and Uncuffed Secured by: Velcro    Assessment & Plan:  OSA/OHS Tracheostomy dep Diastolic HF Obesity   Discussion 35 year old female w/ essentially untreated severe OSA and OHS. She is recently out of the hospital for what was acute hypoxic and hypercarbic respiratory failure in the setting of: Chronic hypoxia-->secondary PAH-->eventually decompensated cor pulmonale and then decompensated diastolic dysfxn resulting in pulmonary edema and respiratory failure. She is now tracheostomy dependent. Her last trach change was in the hospital so she does not need this changed currently. I do not think currently that she is a candidate for decannulation as w/ the trach her OSA is treated. At this point weight loss is the most important intervention in addition to her BP and volume management. I have referred her to sleep. If she could do a PSG split study and show that she could be successful w/ BIPAP or CPAP then it would be reasonable to decannulate her however I do not think that this would be advisable at this point.  Plan Will refer to sleep medicine  ROV about 8 weeks for routine trach change She will need PSG as well as titration study   Erick Colace ACNP-BC [place] Pager # [phone] OR # [phone] if no answer

## 2016-07-30 ENCOUNTER — Telehealth: Payer: Self-pay

## 2016-07-30 NOTE — Telephone Encounter (Signed)
Patient called and would like to know when she could start back driving? Also would like to know if her portable machine is okay to bring with her if she spends the night off? Patient would like a call back at our earliest convenience.  Please advise

## 2016-07-30 NOTE — Telephone Encounter (Signed)
I believe she can start back driving. I would ask that she practice with a friend or family member first to make sure everything goes ok.    Not sure what portable machine she's referring to. (oxygen?). If so, she'll need to speak with pulmonary.  BTW, this is a Scientist, research (physical sciences) patient I believe. I did the TC visit

## 2016-08-02 NOTE — Telephone Encounter (Signed)
Contacted patient and shared Dr. Charm Barges advice regarding driving. Advised patient that questions regarding her oxygen machine should be directed to pulmonary or as she clarified 'the trach clinic'.

## 2016-08-03 ENCOUNTER — Telehealth: Payer: Self-pay | Admitting: Pulmonary Disease

## 2016-08-03 NOTE — Telephone Encounter (Signed)
Spoke with pt who states she is planning an overnight trip for [place] and would like a POC for night time O2 useage.  TP please advise in RA absences. Thanks.

## 2016-08-04 ENCOUNTER — Telehealth: Payer: Self-pay | Admitting: *Deleted

## 2016-08-04 DIAGNOSIS — I5032 Chronic diastolic (congestive) heart failure: Secondary | ICD-10-CM | POA: Insufficient documentation

## 2016-08-04 MED ORDER — HYDRALAZINE HCL 100 MG PO TABS: 100.0000 mg | ORAL_TABLET | Freq: Three times a day (TID) | ORAL | 0 refills | Status: DC

## 2016-08-04 MED ORDER — FUROSEMIDE 80 MG PO TABS: 80.0000 mg | ORAL_TABLET | Freq: Three times a day (TID) | ORAL | 0 refills | Status: DC

## 2016-08-04 MED ORDER — ISOSORBIDE DINITRATE 30 MG PO TABS: 30.0000 mg | ORAL_TABLET | Freq: Three times a day (TID) | ORAL | 0 refills | Status: DC

## 2016-08-04 MED ORDER — LABETALOL HCL 300 MG PO TABS: 300.0000 mg | ORAL_TABLET | Freq: Two times a day (BID) | ORAL | 0 refills | Status: DC

## 2016-08-04 MED ORDER — POTASSIUM CHLORIDE CRYS ER 20 MEQ PO TBCR: 20.0000 meq | EXTENDED_RELEASE_TABLET | Freq: Two times a day (BID) | ORAL | 0 refills | Status: DC

## 2016-08-04 NOTE — Telephone Encounter (Signed)
ptn called office phn [phone] has 0 refills and needs meds asap

## 2016-08-04 NOTE — Telephone Encounter (Signed)
Disregard previous message

## 2016-08-04 NOTE — Telephone Encounter (Addendum)
ptn called office phn [phone] has 0 refills and needs meds asap.  She states she does not have a PCP.  I explained to her that these medications need to be managed by PCP or Cardiologist due to the high doses and type of medications.  I have sent in one month refill on all medications and she is going to work on getting appt with PCP or Dr Stanford Breed to follow up on these meds tomorrow.Marland Kitchen

## 2016-08-05 NOTE — Telephone Encounter (Signed)
Called and spoke with pt and she is aware of appt with RA 1/29 but she has not been seen in our office for the oxygen.  She will call the trach center for this.

## 2016-08-05 NOTE — Telephone Encounter (Signed)
Do we follow this pt ?

## 2016-08-18 ENCOUNTER — Encounter: Payer: Self-pay | Admitting: Registered Nurse

## 2016-08-18 ENCOUNTER — Telehealth: Payer: Self-pay | Admitting: Registered Nurse

## 2016-08-18 ENCOUNTER — Encounter: Payer: PRIVATE HEALTH INSURANCE | Attending: Physical Medicine & Rehabilitation | Admitting: Registered Nurse

## 2016-08-18 VITALS — BP 183/110 | HR 94 | Resp 14

## 2016-08-18 DIAGNOSIS — J9690 Respiratory failure, unspecified, unspecified whether with hypoxia or hypercapnia: Secondary | ICD-10-CM | POA: Insufficient documentation

## 2016-08-18 DIAGNOSIS — I5030 Unspecified diastolic (congestive) heart failure: Secondary | ICD-10-CM | POA: Insufficient documentation

## 2016-08-18 DIAGNOSIS — Z930 Tracheostomy status: Secondary | ICD-10-CM

## 2016-08-18 DIAGNOSIS — R1310 Dysphagia, unspecified: Secondary | ICD-10-CM | POA: Diagnosis not present

## 2016-08-18 DIAGNOSIS — I10 Essential (primary) hypertension: Secondary | ICD-10-CM | POA: Diagnosis not present

## 2016-08-18 DIAGNOSIS — E119 Type 2 diabetes mellitus without complications: Secondary | ICD-10-CM | POA: Insufficient documentation

## 2016-08-18 DIAGNOSIS — I5031 Acute diastolic (congestive) heart failure: Secondary | ICD-10-CM

## 2016-08-18 DIAGNOSIS — R000 Tachycardia, unspecified: Secondary | ICD-10-CM | POA: Diagnosis not present

## 2016-08-18 DIAGNOSIS — N179 Acute kidney failure, unspecified: Secondary | ICD-10-CM | POA: Diagnosis not present

## 2016-08-18 DIAGNOSIS — I110 Hypertensive heart disease with heart failure: Secondary | ICD-10-CM | POA: Insufficient documentation

## 2016-08-18 DIAGNOSIS — R5381 Other malaise: Secondary | ICD-10-CM | POA: Insufficient documentation

## 2016-08-18 DIAGNOSIS — Z5189 Encounter for other specified aftercare: Secondary | ICD-10-CM | POA: Insufficient documentation

## 2016-08-18 NOTE — Patient Instructions (Addendum)
  Call [facility]: [phone]   Dr. Kara Mead ordering your Oxygen  You seen [name] [name] in the [facility] :

## 2016-08-18 NOTE — Progress Notes (Deleted)
Con

## 2016-08-18 NOTE — Telephone Encounter (Signed)
Placed a call to [name] [name], she states she is on her way to purchase blood pressure cuff. She was instructed to keep blood pressure log and call office in the morning. She verbalizes understanding.

## 2016-08-18 NOTE — Progress Notes (Deleted)
Subjective:    Patient ID: [name] [name], female    DOB: 03/01/1981, 35 y.o.   MRN: [id number]  HPI   Pain Inventory Average Pain 0 Pain Right Now 0 My pain is no pain  In the last 24 hours, has pain interfered with the following? General activity 0 Relation with others 0 Enjoyment of life 0 What TIME of day is your pain at its worst? no pain Sleep (in general) Good  Pain is worse with: no pain Pain improves with: no pain Relief from Meds: no pain  Mobility walk without assistance  Function disabled: date disabled .  Neuro/Psych No problems in this area  Prior Studies Any changes since last visit?  no  Physicians involved in your care Any changes since last visit?  no   Family History  Problem Relation Age of Onset  . Hypertension Mother   . Diabetes Other    Social History   Social History  . Marital status: Single    Spouse name: N/A  . Number of children: N/A  . Years of education: N/A   Occupational History  . 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 Smoking status: Never Smoker  . Smokeless tobacco: Never Used  . Alcohol use No  . Drug use: No  . Sexual activity: Not Asked   Other Topics Concern  . 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 TRACHEOSTOMY TUBE PLACEMENT N/A 06/11/2016   Procedure: TRACHEOSTOMY;  Surgeon: Melida Quitter, MD;  Location: [facility];  Service: ENT;  Laterality: N/A;   Past Medical History:  Diagnosis Date  . Diabetes mellitus without complication (HCC)    GDM  . Gestational diabetes mellitus in pregnancy 05/24/2013  . HTN in pregnancy, chronic 05/24/2013  . Hypertension   . Morbid obesity ([place])   . Sinus tachycardia    BP (!) 166/103   Pulse 98   Resp 14   SpO2 91%   Opioid Risk Score:   Fall Risk Score:  `1  Depression screen PHQ  2/9  Depression screen PHQ 2/9 07/12/2016  Decreased Interest 0  Down, Depressed, Hopeless 0  PHQ - 2 Score 0  Altered sleeping 0  Tired, decreased energy 0  Change in appetite 0  Feeling bad or failure about yourself  0  Trouble concentrating 0  Moving slowly or fidgety/restless 0  Suicidal thoughts 0  PHQ-9 Score 0   2 Review of Systems  Constitutional: Negative.   HENT: Negative.   Eyes: Negative.   Respiratory: Negative.   Cardiovascular: Negative.   Gastrointestinal: Negative.   Endocrine: Negative.   Genitourinary: Negative.   Musculoskeletal: Negative.   Skin: Negative.   Allergic/Immunologic: Negative.   Neurological: Negative.   Hematological: Negative.   Psychiatric/Behavioral: Negative.   All other systems reviewed and are negative.      Objective:   Physical Exam        Assessment & Plan:

## 2016-08-18 NOTE — Progress Notes (Signed)
Subjective:    Patient ID: [name] [name], female    DOB: 1980-11-08, 35 y.o.   MRN: [id number]  HPI: Ms. [name] [name] is a 35 year old female who returns for follow up appointment. She denies any pain. Her current exercise regime attending physical therapy two times a week.  [name] [name] arrived hypertensive, blood pressure was re-checked several times. She states she has been taking her Hydralazine BID, it was prescribed every 8 hours. Medications were reviewed. Educated on medication compliance. The above discuss with Dr. Naaman Plummer, she was instructed to take her hydralazine, blood pressure was re-checked after an hour. She remains hypertensive,she refuses ED evaluation or Urgent care, also instructed to go to ED for evaluation. She verbalizes understanding.  She was instructed to keep a blood pressure log and to be compliant with medication regime. She called  and obtained a PCP her appointment is on 09/09/2016.  She will follow up with Dr. Posey Pronto next week.   Pain Inventory Average Pain 0 Pain Right Now 0 My pain is no pain  In the last 24 hours, has pain interfered with the following? General activity 0 Relation with others 0 Enjoyment of life 0 What TIME of day is your pain at its worst? no pain Sleep (in general) Good  Pain is worse with: no pain Pain improves with: no pain Relief from Meds: 0  Mobility Do you have any goals in this area?  no  Function Do you have any goals in this area?  no  Neuro/Psych No problems in this area  Prior Studies Any changes since last visit?  no  Physicians involved in your care Any changes since last visit?  no   Family History  Problem Relation Age of Onset  . Hypertension Mother   . Diabetes Other    Social History   Social History  . Marital status: Single    Spouse name: N/A  . Number of children: N/A  . 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 Smoking status: Never Smoker  . Smokeless tobacco: Never Used  . Alcohol use No  . Drug use: No  . Sexual activity: Not Asked   Other Topics Concern  . 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 TRACHEOSTOMY TUBE PLACEMENT N/A 06/11/2016   Procedure: TRACHEOSTOMY;  Surgeon: Melida Quitter, MD;  Location: [facility];  Service: ENT;  Laterality: N/A;   Past Medical History:  Diagnosis Date  . Diabetes mellitus without complication (HCC)    GDM  . Gestational diabetes mellitus in pregnancy 05/24/2013  . HTN in pregnancy, chronic 05/24/2013  . Hypertension   . Morbid obesity ([place])   . Sinus tachycardia    BP (!) 166/103   Pulse 98   Resp 14   SpO2 91%   Opioid Risk Score:   Fall Risk Score:  `1  Depression screen PHQ 2/9  Depression screen PHQ 2/9 07/12/2016  Decreased Interest 0  Down, Depressed, Hopeless 0  PHQ - 2 Score 0  Altered sleeping 0  Tired, decreased energy 0  Change in appetite 0  Feeling bad or failure about yourself  0  Trouble concentrating 0  Moving slowly or fidgety/restless 0  Suicidal  thoughts 0  PHQ-9 Score 0    Review of Systems  Constitutional: Negative.   HENT: Negative.   Eyes: Negative.   Respiratory: Negative.   Cardiovascular: Negative.   Gastrointestinal: Negative.   Endocrine: Negative.   Genitourinary: Negative.   Skin: Negative.   Allergic/Immunologic: Negative.   Neurological: Negative.   Hematological: Negative.   Psychiatric/Behavioral: Negative.   All other systems reviewed and are negative.      Objective:   Physical Exam  Constitutional: She is oriented to person, place, and time. She appears well-developed and well-nourished.  Morbid Obesity  HENT:  Head: Normocephalic and atraumatic.  Neck: Normal range of motion. Neck supple.  Cardiovascular: Normal rate and regular  rhythm.   Pulmonary/Chest: Effort normal and breath sounds normal.  Trach with PMV  Musculoskeletal:  Normal Muscle Bulk and Muscle Testing Reveals: Upper Extremities: Full ROM and Muscle Strength 5/5 Lower Extremities: Full ROM and Muscle Strength 5/5 Arises from Table with ease Narrow Based Gait   Neurological: She is alert and oriented to person, place, and time.  Skin: Skin is warm and dry.  Psychiatric: She has a normal mood and affect.  Nursing note and vitals reviewed.         Assessment & Plan:  1. Debilitationsecondary to CHF/respiratory failure: Continue outpt PT and HEP. Tracheostomy 06/11/2016, wearing PMV Trach Clinic Following. Continue Oxygen at HS 2. Pain Management: Tylenol as needed 3. Uncontrolled HTN: Refuses ED evaluation and Urgent Care Evalution. Encouraged to purchase Blood Pressure apparatus, she verbalizes understanding.  She was instructed to take her Hydralazine 159m, during visit.   Continue Hydralazine, Isordil and Labetalol 4. Diastolic CHF: Continue Lasix  Total Time Spent in office 2.5 hours, all questions were encouraged and answered.  Educated on medication compliance and evaluation after Hydralazine.  F/U in 1 week.

## 2016-08-19 ENCOUNTER — Telehealth: Payer: Self-pay | Admitting: Registered Nurse

## 2016-08-19 NOTE — Telephone Encounter (Signed)
Placed a call to Ms. Gilkey, no answer left message to return the call.

## 2016-08-20 ENCOUNTER — Telehealth: Payer: Self-pay

## 2016-08-20 NOTE — Telephone Encounter (Signed)
Return Ms. Marinello call, she states she purchase a blood pressure cuff: States her Blood Pressures from 08/19/2016  4:15 p.m. 163/92  5:15 p.m. 151/92 9:30 p.m.135/92  08/20/2016 Prior to medications: 133/78 3:00 pm Today 121/85. She has a F/U appointment with Dr. Posey Pronto on 08/27/2016 She verbalizes understanding.

## 2016-08-20 NOTE — Telephone Encounter (Signed)
[name] [name] has called this afternoon requesting a call back from [name] regarding her blood pressure.

## 2016-08-27 ENCOUNTER — Telehealth: Payer: Self-pay | Admitting: Physical Medicine & Rehabilitation

## 2016-08-27 ENCOUNTER — Telehealth: Payer: Self-pay

## 2016-08-27 ENCOUNTER — Encounter: Payer: PRIVATE HEALTH INSURANCE | Admitting: Physical Medicine & Rehabilitation

## 2016-08-27 NOTE — Telephone Encounter (Signed)
[name] [name] left a message stating she had to cancel her 2:40 pm appointment with Dr. Posey Pronto today 08/27/2016 due to the weather.. she is currently in [facility], roads are bad. [name] [name] said her blood pressure this morning was 127/81.

## 2016-08-27 NOTE — Telephone Encounter (Signed)
Ptn cancelled appt due to bad weather - but states her blood pressure is normal wanted to advise providers.

## 2016-09-03 ENCOUNTER — Telehealth: Payer: Self-pay | Admitting: Nurse Practitioner

## 2016-09-03 NOTE — Telephone Encounter (Signed)
Patient has appt to establish care on the 18th.  She will be out of labetatlol, klor-com, isosoribide and furosemide before then.  States the current prescribing MD is out of the office and can not get refills.  She is requesting Baldo Ash to send scripts in to [facility] in [place] (states could be a Lysbeth Galas address) to get her though until appt date.  Please advise.

## 2016-09-06 NOTE — Telephone Encounter (Signed)
Please make appt for patient to be seen some time tomorrow for HTN (18mns slot).

## 2016-09-06 NOTE — Telephone Encounter (Signed)
Pt has an appt tomorrow at 09/07/2016 @ 430, this is just to get her meds refill. Still need to see her for establish care.

## 2016-09-07 ENCOUNTER — Ambulatory Visit: Payer: PRIVATE HEALTH INSURANCE | Admitting: Nurse Practitioner

## 2016-09-09 ENCOUNTER — Ambulatory Visit: Payer: Self-pay | Admitting: Nurse Practitioner

## 2016-09-15 ENCOUNTER — Ambulatory Visit (INDEPENDENT_AMBULATORY_CARE_PROVIDER_SITE_OTHER): Payer: PRIVATE HEALTH INSURANCE | Admitting: Nurse Practitioner

## 2016-09-15 ENCOUNTER — Inpatient Hospital Stay (HOSPITAL_COMMUNITY)
Admission: EM | Admit: 2016-09-15 | Discharge: 2016-09-23 | DRG: 682 | Disposition: A | Discharge status: Home / Self Care | Payer: Medicaid Other | Attending: Family Medicine | Admitting: Family Medicine

## 2016-09-15 ENCOUNTER — Telehealth: Payer: Self-pay

## 2016-09-15 ENCOUNTER — Encounter (HOSPITAL_COMMUNITY): Payer: Self-pay | Admitting: *Deleted

## 2016-09-15 ENCOUNTER — Encounter: Payer: Self-pay | Admitting: Nurse Practitioner

## 2016-09-15 ENCOUNTER — Emergency Department (HOSPITAL_COMMUNITY): Payer: Medicaid Other

## 2016-09-15 ENCOUNTER — Other Ambulatory Visit (INDEPENDENT_AMBULATORY_CARE_PROVIDER_SITE_OTHER): Payer: PRIVATE HEALTH INSURANCE

## 2016-09-15 ENCOUNTER — Inpatient Hospital Stay (HOSPITAL_COMMUNITY): Payer: Medicaid Other

## 2016-09-15 VITALS — BP 130/80 | HR 110 | Temp 98.6°F | Resp 18 | Ht 65.0 in | Wt 329.8 lb

## 2016-09-15 DIAGNOSIS — I130 Hypertensive heart and chronic kidney disease with heart failure and stage 1 through stage 4 chronic kidney disease, or unspecified chronic kidney disease: Secondary | ICD-10-CM | POA: Diagnosis present

## 2016-09-15 DIAGNOSIS — E876 Hypokalemia: Secondary | ICD-10-CM | POA: Diagnosis present

## 2016-09-15 DIAGNOSIS — N179 Acute kidney failure, unspecified: Principal | ICD-10-CM

## 2016-09-15 DIAGNOSIS — E118 Type 2 diabetes mellitus with unspecified complications: Secondary | ICD-10-CM

## 2016-09-15 DIAGNOSIS — Z79899 Other long term (current) drug therapy: Secondary | ICD-10-CM | POA: Diagnosis not present

## 2016-09-15 DIAGNOSIS — D649 Anemia, unspecified: Secondary | ICD-10-CM | POA: Diagnosis present

## 2016-09-15 DIAGNOSIS — N183 Chronic kidney disease, stage 3 unspecified: Secondary | ICD-10-CM | POA: Diagnosis present

## 2016-09-15 DIAGNOSIS — I10 Essential (primary) hypertension: Secondary | ICD-10-CM

## 2016-09-15 DIAGNOSIS — E66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: Secondary | ICD-10-CM | POA: Diagnosis present

## 2016-09-15 DIAGNOSIS — E1165 Type 2 diabetes mellitus with hyperglycemia: Secondary | ICD-10-CM

## 2016-09-15 DIAGNOSIS — Z9119 Patient's noncompliance with other medical treatment and regimen: Secondary | ICD-10-CM

## 2016-09-15 DIAGNOSIS — N19 Unspecified kidney failure: Secondary | ICD-10-CM

## 2016-09-15 DIAGNOSIS — I5032 Chronic diastolic (congestive) heart failure: Secondary | ICD-10-CM

## 2016-09-15 DIAGNOSIS — Z8249 Family history of ischemic heart disease and other diseases of the circulatory system: Secondary | ICD-10-CM

## 2016-09-15 DIAGNOSIS — Z930 Tracheostomy status: Secondary | ICD-10-CM

## 2016-09-15 DIAGNOSIS — Z13220 Encounter for screening for lipoid disorders: Secondary | ICD-10-CM

## 2016-09-15 DIAGNOSIS — R8290 Unspecified abnormal findings in urine: Secondary | ICD-10-CM | POA: Diagnosis present

## 2016-09-15 DIAGNOSIS — E119 Type 2 diabetes mellitus without complications: Secondary | ICD-10-CM | POA: Diagnosis present

## 2016-09-15 DIAGNOSIS — I272 Pulmonary hypertension, unspecified: Secondary | ICD-10-CM | POA: Diagnosis present

## 2016-09-15 DIAGNOSIS — Z136 Encounter for screening for cardiovascular disorders: Secondary | ICD-10-CM

## 2016-09-15 DIAGNOSIS — E860 Dehydration: Secondary | ICD-10-CM | POA: Diagnosis present

## 2016-09-15 DIAGNOSIS — E872 Acidosis: Secondary | ICD-10-CM | POA: Diagnosis present

## 2016-09-15 DIAGNOSIS — J189 Pneumonia, unspecified organism: Secondary | ICD-10-CM | POA: Diagnosis present

## 2016-09-15 DIAGNOSIS — Z6841 Body Mass Index (BMI) 40.0 and over, adult: Secondary | ICD-10-CM

## 2016-09-15 DIAGNOSIS — N390 Urinary tract infection, site not specified: Secondary | ICD-10-CM | POA: Diagnosis not present

## 2016-09-15 DIAGNOSIS — R197 Diarrhea, unspecified: Secondary | ICD-10-CM | POA: Diagnosis present

## 2016-09-15 DIAGNOSIS — E1122 Type 2 diabetes mellitus with diabetic chronic kidney disease: Secondary | ICD-10-CM | POA: Diagnosis present

## 2016-09-15 DIAGNOSIS — J9612 Chronic respiratory failure with hypercapnia: Secondary | ICD-10-CM | POA: Diagnosis present

## 2016-09-15 DIAGNOSIS — G4733 Obstructive sleep apnea (adult) (pediatric): Secondary | ICD-10-CM | POA: Diagnosis present

## 2016-09-15 DIAGNOSIS — J9611 Chronic respiratory failure with hypoxia: Secondary | ICD-10-CM | POA: Diagnosis present

## 2016-09-15 DIAGNOSIS — IMO0002 Reserved for concepts with insufficient information to code with codable children: Secondary | ICD-10-CM

## 2016-09-15 DIAGNOSIS — J984 Other disorders of lung: Secondary | ICD-10-CM

## 2016-09-15 DIAGNOSIS — Z794 Long term (current) use of insulin: Secondary | ICD-10-CM

## 2016-09-15 HISTORY — DX: Other disorders of lung: J98.4

## 2016-09-15 LAB — COMPREHENSIVE METABOLIC PANEL
ALT: 18 U/L (ref 0–35)
ALT: 21 U/L (ref 14–54)
AST: 23 U/L (ref 0–37)
AST: 24 U/L (ref 15–41)
Albumin: 3.2 g/dL — ABNORMAL LOW (ref 3.5–5.2)
Albumin: 2.6 g/dL — ABNORMAL LOW (ref 3.5–5.0)
Alkaline Phosphatase: 160 U/L — ABNORMAL HIGH (ref 39–117)
Alkaline Phosphatase: 151 U/L — ABNORMAL HIGH (ref 38–126)
Anion gap: 19 — ABNORMAL HIGH (ref 5–15)
BUN: 112 mg/dL (ref 6–23)
BUN: 120 mg/dL — ABNORMAL HIGH (ref 6–20)
CO2: 21 mEq/L (ref 19–32)
CO2: 18 mmol/L — ABNORMAL LOW (ref 22–32)
Calcium: 9.6 mg/dL (ref 8.4–10.5)
Calcium: 9.4 mg/dL (ref 8.9–10.3)
Chloride: 94 mEq/L — ABNORMAL LOW (ref 96–112)
Chloride: 97 mmol/L — ABNORMAL LOW (ref 101–111)
Creatinine, Ser: 10.25 mg/dL (ref 0.40–1.20)
Creatinine, Ser: 10.76 mg/dL — ABNORMAL HIGH (ref 0.44–1.00)
GFR calc Af Amer: 5 mL/min — ABNORMAL LOW (ref >60)
GFR calc non Af Amer: 4 mL/min — ABNORMAL LOW (ref >60)
GFR: 5.47 mL/min — CL (ref >60.00)
Glucose, Bld: 134 mg/dL — ABNORMAL HIGH (ref 70–99)
Glucose, Bld: 127 mg/dL — ABNORMAL HIGH (ref 65–99)
Potassium: 3 mEq/L — ABNORMAL LOW (ref 3.5–5.1)
Potassium: 3.2 mmol/L — ABNORMAL LOW (ref 3.5–5.1)
Sodium: 134 mEq/L — ABNORMAL LOW (ref 135–145)
Sodium: 134 mmol/L — ABNORMAL LOW (ref 135–145)
Total Bilirubin: 1.4 mg/dL — ABNORMAL HIGH (ref 0.2–1.2)
Total Bilirubin: 1.6 mg/dL — ABNORMAL HIGH (ref 0.3–1.2)
Total Protein: 8.5 g/dL — ABNORMAL HIGH (ref 6.0–8.3)
Total Protein: 8.3 g/dL — ABNORMAL HIGH (ref 6.5–8.1)

## 2016-09-15 LAB — URINALYSIS, ROUTINE W REFLEX MICROSCOPIC
Bilirubin Urine: NEGATIVE
Glucose, UA: NEGATIVE mg/dL
Ketones, ur: NEGATIVE mg/dL
Nitrite: NEGATIVE
Protein, ur: 100 mg/dL — AB
Specific Gravity, Urine: 1.012 (ref 1.005–1.030)
pH: 5 (ref 5.0–8.0)

## 2016-09-15 LAB — SODIUM, URINE, RANDOM: Sodium, Ur: 30 mmol/L

## 2016-09-15 LAB — PROTEIN / CREATININE RATIO, URINE
Creatinine, Urine: 110.52 mg/dL
Protein Creatinine Ratio: 3.29 mg/mg{Cre} — ABNORMAL HIGH (ref 0.00–0.15)
Total Protein, Urine: 364 mg/dL

## 2016-09-15 LAB — HEMOGLOBIN A1C: Hgb A1c MFr Bld: 6.2 % (ref 4.6–6.5)

## 2016-09-15 LAB — I-STAT BETA HCG BLOOD, ED (MC, WL, AP ONLY): I-stat hCG, quantitative: <5 m[IU]/mL (ref <5)

## 2016-09-15 LAB — LIPID PANEL
Cholesterol: 176 mg/dL (ref 0–200)
HDL: 10.6 mg/dL — ABNORMAL LOW (ref >39.00)
NonHDL: 165.19
Total CHOL/HDL Ratio: 17
Triglycerides: 250 mg/dL — ABNORMAL HIGH (ref 0.0–149.0)
VLDL: 50 mg/dL — ABNORMAL HIGH (ref 0.0–40.0)

## 2016-09-15 LAB — LDL CHOLESTEROL, DIRECT: Direct LDL: 99 mg/dL

## 2016-09-15 LAB — CBC
HCT: 33.1 % — ABNORMAL LOW (ref 36.0–46.0)
Hemoglobin: 11.6 g/dL — ABNORMAL LOW (ref 12.0–15.0)
MCH: 31.1 pg (ref 26.0–34.0)
MCHC: 35 g/dL (ref 30.0–36.0)
MCV: 88.7 fL (ref 78.0–100.0)
Platelets: 355 10*3/uL (ref 150–400)
RBC: 3.73 MIL/uL — ABNORMAL LOW (ref 3.87–5.11)
RDW: 15.7 % — ABNORMAL HIGH (ref 11.5–15.5)
WBC: 13.7 10*3/uL — ABNORMAL HIGH (ref 4.0–10.5)

## 2016-09-15 LAB — BRAIN NATRIURETIC PEPTIDE: B Natriuretic Peptide: 175.1 pg/mL — ABNORMAL HIGH (ref 0.0–100.0)

## 2016-09-15 LAB — PHOSPHORUS: Phosphorus: 4.5 mg/dL (ref 2.5–4.6)

## 2016-09-15 LAB — MAGNESIUM: Magnesium: 2.2 mg/dL (ref 1.7–2.4)

## 2016-09-15 MED ORDER — ISOSORBIDE DINITRATE 30 MG PO TABS
30.0000 mg | ORAL_TABLET | Freq: Three times a day (TID) | ORAL | Status: DC
  Administered 2016-09-15 – 2016-09-23 (×23): 30 mg via ORAL
  Filled 2016-09-15 (×25): qty 1

## 2016-09-15 MED ORDER — SODIUM BICARBONATE 8.4 % IV SOLN
50.0000 meq | Freq: Once | INTRAVENOUS | Status: AC
  Administered 2016-09-15: 50 meq via INTRAVENOUS
  Filled 2016-09-15: qty 50

## 2016-09-15 MED ORDER — FUROSEMIDE 80 MG PO TABS: 80.0000 mg | ORAL_TABLET | Freq: Three times a day (TID) | ORAL | 4 refills | Status: DC

## 2016-09-15 MED ORDER — ACETAMINOPHEN 650 MG RE SUPP: 650.0000 mg | Freq: Four times a day (QID) | RECTAL | Status: DC | PRN

## 2016-09-15 MED ORDER — LABETALOL HCL 200 MG PO TABS
300.0000 mg | ORAL_TABLET | Freq: Two times a day (BID) | ORAL | Status: DC
  Administered 2016-09-15 – 2016-09-16 (×2): 300 mg via ORAL
  Filled 2016-09-15 (×2): qty 1

## 2016-09-15 MED ORDER — SODIUM CHLORIDE 0.9 % IV BOLUS (SEPSIS)
1000.0000 mL | Freq: Once | INTRAVENOUS | Status: AC
  Administered 2016-09-15: 1000 mL via INTRAVENOUS

## 2016-09-15 MED ORDER — SODIUM CHLORIDE 0.9 % IV SOLN
INTRAVENOUS | Status: DC
  Administered 2016-09-15: 100 mL/h via INTRAVENOUS
  Administered 2016-09-16 – 2016-09-19 (×4): via INTRAVENOUS

## 2016-09-15 MED ORDER — ADULT MULTIVITAMIN W/MINERALS CH: 1.0000 | ORAL_TABLET | Freq: Every day | ORAL | 0 refills | Status: DC

## 2016-09-15 MED ORDER — ADULT MULTIVITAMIN W/MINERALS CH
1.0000 | ORAL_TABLET | Freq: Every day | ORAL | Status: DC
Start: 2016-09-16 — End: 2016-09-23
  Administered 2016-09-16 – 2016-09-23 (×8): 1 via ORAL
  Filled 2016-09-15 (×8): qty 1

## 2016-09-15 MED ORDER — DEXTROSE 5 % IV SOLN
1.0000 g | Freq: Once | INTRAVENOUS | Status: AC
  Administered 2016-09-15: 1 g via INTRAVENOUS
  Filled 2016-09-15: qty 10

## 2016-09-15 MED ORDER — HYDRALAZINE HCL 100 MG PO TABS: 100.0000 mg | ORAL_TABLET | Freq: Three times a day (TID) | ORAL | 4 refills | Status: DC

## 2016-09-15 MED ORDER — ONDANSETRON HCL 4 MG/2ML IJ SOLN: 4.0000 mg | Freq: Four times a day (QID) | INTRAMUSCULAR | Status: DC | PRN

## 2016-09-15 MED ORDER — POTASSIUM CHLORIDE CRYS ER 20 MEQ PO TBCR
40.0000 meq | EXTENDED_RELEASE_TABLET | Freq: Once | ORAL | Status: AC
  Administered 2016-09-15: 40 meq via ORAL
  Filled 2016-09-15: qty 2

## 2016-09-15 MED ORDER — HYDRALAZINE HCL 50 MG PO TABS
100.0000 mg | ORAL_TABLET | Freq: Three times a day (TID) | ORAL | Status: DC
  Administered 2016-09-15 – 2016-09-23 (×22): 100 mg via ORAL
  Filled 2016-09-15 (×24): qty 2

## 2016-09-15 MED ORDER — DEXTROSE 5 % IV SOLN
1.0000 g | INTRAVENOUS | Status: DC
  Administered 2016-09-16 – 2016-09-18 (×3): 1 g via INTRAVENOUS
  Filled 2016-09-15 (×3): qty 10

## 2016-09-15 MED ORDER — ONDANSETRON HCL 4 MG PO TABS: 4.0000 mg | ORAL_TABLET | Freq: Four times a day (QID) | ORAL | Status: DC | PRN

## 2016-09-15 MED ORDER — SODIUM CHLORIDE 0.9% FLUSH
3.0000 mL | Freq: Two times a day (BID) | INTRAVENOUS | Status: DC
  Administered 2016-09-15 – 2016-09-22 (×11): 3 mL via INTRAVENOUS

## 2016-09-15 MED ORDER — ISOSORBIDE DINITRATE 30 MG PO TABS: 30.0000 mg | ORAL_TABLET | Freq: Three times a day (TID) | ORAL | 4 refills | Status: DC

## 2016-09-15 MED ORDER — HEPARIN SODIUM (PORCINE) 5000 UNIT/ML IJ SOLN
5000.0000 [IU] | Freq: Three times a day (TID) | INTRAMUSCULAR | Status: DC
  Administered 2016-09-16 – 2016-09-23 (×21): 5000 [IU] via SUBCUTANEOUS
  Filled 2016-09-15 (×22): qty 1

## 2016-09-15 MED ORDER — FAMOTIDINE 20 MG PO TABS: 20.0000 mg | ORAL_TABLET | Freq: Two times a day (BID) | ORAL | 3 refills | Status: DC

## 2016-09-15 MED ORDER — LABETALOL HCL 300 MG PO TABS: 300.0000 mg | ORAL_TABLET | Freq: Two times a day (BID) | ORAL | 4 refills | Status: DC

## 2016-09-15 MED ORDER — ACETAMINOPHEN 325 MG PO TABS: 650.0000 mg | ORAL_TABLET | Freq: Four times a day (QID) | ORAL | Status: DC | PRN

## 2016-09-15 MED ORDER — POTASSIUM CHLORIDE CRYS ER 20 MEQ PO TBCR: 20.0000 meq | EXTENDED_RELEASE_TABLET | Freq: Two times a day (BID) | ORAL | 4 refills | Status: DC

## 2016-09-15 NOTE — ED Notes (Signed)
Patient here for abnormal renal function lab work.  Denies any pain.  No complaints.  Patient does have trach.

## 2016-09-15 NOTE — Telephone Encounter (Signed)
Transferred lab to [place] from [facility]---reporting critical lab value BUN 112, Creatinine 10.25, and GFR 5.47---routing to charlotte nche, NP and will also give verbal to charlotte

## 2016-09-15 NOTE — Progress Notes (Signed)
Subjective:  Patient ID: [name] [name], female    DOB: [birth year]  Age: 36 y.o. MRN: [id number]  CC: Establish Care (does not really know where she went prior to here for primary care, has been out of bp medicine since last week)   HPI   no previous pcp prior to hospitalization 05/2016  Hospitalized 05/2016 with acute diastolic HF and and hypoxemia. Tracheostomy placed since 05/2016 secondary to sleep apnea. Current use of CPAP at home Re eval of tracheostomy revision  By Dr. Davonna Belling 09/20/2016.  No cardiologist follow up at this time. She checks her weight daily and maintain low salt diet.  Procedures/Significant Events: 10/4 Echocardiogram:LVEF=: 50%to 55%.-- Left atrium: mildly dilated.-Right atrium: mildly dilated. - Pulmonary arteries PApeak pressure: 42 mm Hg (s).  Diabetes: Newly diagnosed during hospitalization. No medication at this time.   Depression:  Depression screen [facility] 2/9 07/12/2016  Decreased Interest 0  Down, Depressed, Hopeless 0  PHQ - 2 Score 0  Altered sleeping 0  Tired, decreased energy 0  Change in appetite 0  Feeling bad or failure about yourself  0  Trouble concentrating 0  Moving slowly or fidgety/restless 0  Suicidal thoughts 0  PHQ-9 Score 0    No facility-administered medications prior to visit.    Outpatient Medications Prior to Visit  Medication Sig Dispense Refill  . famotidine (PEPCID) 20 MG tablet Take 1 tablet (20 mg total) by mouth 2 (two) times daily. 60 tablet 0  . furosemide (LASIX) 80 MG tablet Take 1 tablet (80 mg total) by mouth 3 (three) times daily. 90 tablet 0  . hydrALAZINE (APRESOLINE) 100 MG tablet Take 1 tablet (100 mg total) by mouth every 8 (eight) hours. 90 tablet 0  . isosorbide dinitrate (ISORDIL) 30 MG tablet Take 1 tablet (30 mg total) by mouth 3 (three) times daily. 90 tablet 0  . labetalol (NORMODYNE) 300 MG tablet Take 1 tablet (300 mg total) by mouth 2 (two) times daily. 60 tablet 0  . Multiple  Vitamin (MULTIVITAMIN WITH MINERALS) TABS tablet Take 1 tablet by mouth daily. 1 tablet 0  . potassium chloride SA (K-DUR,KLOR-CON) 20 MEQ tablet Take 1 tablet (20 mEq total) by mouth 2 (two) times daily. 60 tablet 0    ROS Review of Systems  Constitutional: Negative for chills, fever and malaise/fatigue.  HENT: Negative for congestion, sinus pain and sore throat.   Respiratory: Positive for cough. Negative for sputum production, shortness of breath, wheezing and stridor.   Cardiovascular: Negative for chest pain, orthopnea and PND.  Gastrointestinal: Negative for abdominal pain, blood in stool, heartburn, melena, nausea and vomiting.  Genitourinary: Negative for dysuria.  Musculoskeletal: Negative for falls.  Skin: Negative.   Neurological: Negative for tingling, sensory change and headaches.  Psychiatric/Behavioral: Negative for depression, memory loss, substance abuse and suicidal ideas. The patient is not nervous/anxious and does not have insomnia.     Objective:  BP 130/80   Pulse (!) 110   Temp 98.6 F (37 C) (Oral)   Resp 18   Ht 5' 5" (1.651 m)   Wt (!) 329 lb 12.8 oz (149.6 kg)   SpO2 96%   BMI 54.88 kg/m   BP Readings from Last 3 Encounters:  09/15/16 160/96  09/15/16 130/80  08/18/16 (!) 183/110    Wt Readings from Last 3 Encounters:  09/15/16 (!) 329 lb (149.2 kg)  09/15/16 (!) 329 lb 12.8 oz (149.6 kg)  07/06/16 (!) 341 lb 11.4 oz (155 kg)  Physical Exam  Constitutional: She is oriented to person, place, and time. No distress.  Cardiovascular: Normal rate, regular rhythm and normal heart sounds.   Pulmonary/Chest: Effort normal and breath sounds normal.  Abdominal: Soft. Bowel sounds are normal.  Musculoskeletal: She exhibits edema.  Neurological: She is alert and oriented to person, place, and time.  Skin: Skin is warm.  Vitals reviewed.   Lab Results  Component Value Date   WBC 13.7 (H) 09/15/2016   HGB 11.6 (L) 09/15/2016   HCT 33.1 (L)  09/15/2016   PLT 355 09/15/2016   GLUCOSE 127 (H) 09/15/2016   CHOL 176 09/15/2016   TRIG 250.0 (H) 09/15/2016   HDL 10.60 (L) 09/15/2016   LDLDIRECT 99.0 09/15/2016   LDLCALC 92 05/26/2016   ALT 21 09/15/2016   AST 24 09/15/2016   NA 134 (L) 09/15/2016   K 3.2 (L) 09/15/2016   CL 97 (L) 09/15/2016   CREATININE 10.76 (H) 09/15/2016   BUN 120 (H) 09/15/2016   CO2 18 (L) 09/15/2016   TSH 2.615 05/14/2011   INR 1.28 05/26/2016   HGBA1C 6.2 09/15/2016    No results found.  Assessment & Plan:   Willy was seen today for establish care.  Diagnoses and all orders for this visit:  Acute renal injury ([place])  Chronic diastolic congestive heart failure ([place]) -     Ambulatory referral to Cardiology -     furosemide (LASIX) 80 MG tablet; Take 1 tablet (80 mg total) by mouth 3 (three) times daily. -     isosorbide dinitrate (ISORDIL) 30 MG tablet; Take 1 tablet (30 mg total) by mouth 3 (three) times daily. -     labetalol (NORMODYNE) 300 MG tablet; Take 1 tablet (300 mg total) by mouth 2 (two) times daily. -     potassium chloride SA (K-DUR,KLOR-CON) 20 MEQ tablet; Take 1 tablet (20 mEq total) by mouth 2 (two) times daily.  Benign essential HTN -     Comprehensive metabolic panel; Future -     hydrALAZINE (APRESOLINE) 100 MG tablet; Take 1 tablet (100 mg total) by mouth 3 (three) times daily. -     isosorbide dinitrate (ISORDIL) 30 MG tablet; Take 1 tablet (30 mg total) by mouth 3 (three) times daily. -     labetalol (NORMODYNE) 300 MG tablet; Take 1 tablet (300 mg total) by mouth 2 (two) times daily.  Uncontrolled type 2 diabetes mellitus with complication, without long-term current use of insulin (HCC) -     Hemoglobin A1c; Future -     Microalbumin / creatinine urine ratio; Future -     Comprehensive metabolic panel; Future -     Ambulatory referral to Ophthalmology  Encounter for lipid screening for cardiovascular disease -     Lipid panel; Future  Other orders -      famotidine (PEPCID) 20 MG tablet; Take 1 tablet (20 mg total) by mouth 2 (two) times daily. (Patient not taking: Reported on 09/15/2016) -     Multiple Vitamin (MULTIVITAMIN WITH MINERALS) TABS tablet; Take 1 tablet by mouth daily.   I have changed Ms. Louthan's hydrALAZINE. I am also having her maintain her famotidine, furosemide, isosorbide dinitrate, labetalol, multivitamin with minerals, and potassium chloride SA.  Meds ordered this encounter  Medications  . famotidine (PEPCID) 20 MG tablet    Sig: Take 1 tablet (20 mg total) by mouth 2 (two) times daily.    Dispense:  60 tablet    Refill:  3  Order Specific Question:   Supervising Provider    Answer:   Cassandria Anger [[id number]]  . furosemide (LASIX) 80 MG tablet    Sig: Take 1 tablet (80 mg total) by mouth 3 (three) times daily.    Dispense:  90 tablet    Refill:  4    Future refills will need to be managed by a primary care MD    Order Specific Question:   Supervising Provider    Answer:   Cassandria Anger [[id number]]  . hydrALAZINE (APRESOLINE) 100 MG tablet    Sig: Take 1 tablet (100 mg total) by mouth 3 (three) times daily.    Dispense:  90 tablet    Refill:  4    Future refills will need to be managed by a primary care MD    Order Specific Question:   Supervising Provider    Answer:   Cassandria Anger [[id number]]  . isosorbide dinitrate (ISORDIL) 30 MG tablet    Sig: Take 1 tablet (30 mg total) by mouth 3 (three) times daily.    Dispense:  90 tablet    Refill:  4    Future refills will need to be managed by a primary care MD    Order Specific Question:   Supervising Provider    Answer:   Cassandria Anger [[id number]]  . labetalol (NORMODYNE) 300 MG tablet    Sig: Take 1 tablet (300 mg total) by mouth 2 (two) times daily.    Dispense:  60 tablet    Refill:  4    Future refills will need to be managed by a primary care MD    Order Specific Question:   Supervising Provider    Answer:   Cassandria Anger [[id number]]  .  Multiple Vitamin (MULTIVITAMIN WITH MINERALS) TABS tablet    Sig: Take 1 tablet by mouth daily.    Dispense:  30 tablet    Refill:  0    Order Specific Question:   Supervising Provider    Answer:   Cassandria Anger [[id number]]  . potassium chloride SA (K-DUR,KLOR-CON) 20 MEQ tablet    Sig: Take 1 tablet (20 mEq total) by mouth 2 (two) times daily.    Dispense:  60 tablet    Refill:  4    Future refills will need to be managed by a primary care MD    Order Specific Question:   Supervising Provider    Answer:   Cassandria Anger [[id number]]   Follow-up: Return in about 3 months (around 12/14/2016) for DM and HTN, CHF (78mns appt).  CWilfred Lacy NP

## 2016-09-15 NOTE — Consult Note (Signed)
[facility] Consultation Note Requesting Physician:  EDP Primary Nephrologist: N/A Reason for Consult:  Acute renal failure  HPI: The patient is a 36 y.o. year-old AAF with PMH sig for super morbid opbesity, hypertension with h/o HT urgencies and poor control 2/2 non-compliance, OSA/OHV with chronic hypercapnic resp failure (s/p prolonged hospitalization 05/2016-06/2016 requiring trach 05/2016) and now with chronic trach, dCHF, h/o gestational DM (A1C 6.2 no meds).  Baseline creatinine has varied from as low as 0.86 to as high as 1.69 over past 4 months, and was 1.13 as recently as 07/12/16.  She went to her primary care today, primarily to get BP meds refilled (was out of her lasix, labetalol, isosorbide, Klor Con, had only a bottle of hydralazine), She had labs drawn, creatinine returned at 10.25 - was sent to the ED - labs repeated - confirmed creatinine 10.76. UA looked infected, but also with new proteinuria (neg on UA 05/2016) and UPC of 3.29 gm. UNa 30 with FeNa of 2.2% so not c/w pre-renal or Na avid state.   She felt fine for past 2 days, but over past week had N/V/frequent loose watery stools (now resolved) and URI symptoms with cough, but sputum clear, no fevers. Has noted a decrease in UOP past few days. No documented hypotension (checks blood pressures at home). No dizziness or lightheadedness. No change in color of urine, no dysuria, no foamy, tea or coca cola colored urine. Was not taking ACE, ARB, NSAID or PPI.   Creatinine, Ser Past 2 months for Reference  Date/Time Value Ref Range Status  09/15/2016 11:54 AM 10.76 (H) 0.44 - 1.00 mg/dL Final  09/15/2016 08:55 AM 10.25 (HH) 0.40 - 1.20 mg/dL Final  07/12/2016 02:51 PM 1.13 (H) 0.57 - 1.00 mg/dL Final  07/06/2016 05:21 AM 1.43 (H) 0.44 - 1.00 mg/dL Final  07/05/2016 10:28 AM 1.69 (H) 0.44 - 1.00 mg/dL Final  06/30/2016 05:00 AM 1.50 (H) 0.44 - 1.00 mg/dL Final  06/29/2016 03:00 AM 1.37 (H) 0.44 - 1.00 mg/dL Final  06/28/2016 06:30 AM 1.19  (H) 0.44 - 1.00 mg/dL Final  06/27/2016 03:00 AM 1.13 (H) 0.44 - 1.00 mg/dL Final  06/24/2016 05:31 AM 0.88 0.44 - 1.00 mg/dL Final  06/22/2016 06:37 AM 0.86 0.44 - 1.00 mg/dL Final  06/21/2016 05:00 AM 0.90 0.44 - 1.00 mg/dL Final  06/20/2016 06:10 AM 0.92 0.44 - 1.00 mg/dL Final  06/19/2016 04:00 AM 1.05 (H) 0.44 - 1.00 mg/dL Final  06/18/2016 05:00 AM 1.11 (H) 0.44 - 1.00 mg/dL Final  06/16/2016 05:00 AM 1.02 (H) 0.44 - 1.00 mg/dL Final  06/15/2016 05:20 AM 1.14 (H) 0.44 - 1.00 mg/dL Final    Past Medical History:  Diagnosis Date  . Diabetes mellitus without complication (HCC)    GDM  . Gestational diabetes mellitus in pregnancy 05/24/2013  . HTN in pregnancy, chronic 05/24/2013  . Hypertension   . Morbid obesity ([place])   . Sinus tachycardia      Past Surgical History:  Procedure Laterality Date  . TRACHEOSTOMY TUBE PLACEMENT N/A 06/11/2016   Procedure: TRACHEOSTOMY;  Surgeon: Melida Quitter, MD;  Location: [facility] OR;  Service: ENT;  Laterality: N/A;     Family History  Problem Relation Age of Onset  . Hypertension Mother   . Diabetes Other   . Birth defects Paternal Grandfather     unknown type   Social History:  reports that she has never smoked. She has never used smokeless tobacco. She reports that she does not drink alcohol or use drugs.  Allergies  Allergen Reactions  . No Known Allergies     Home medications: Prior to Admission medications   Medication Sig Start Date End Date Taking? Authorizing Provider  furosemide (LASIX) 80 MG tablet Take 1 tablet (80 mg total) by mouth 3 (three) times daily. 09/15/16  Yes Charlene Brooke Nche, NP  hydrALAZINE (APRESOLINE) 100 MG tablet Take 1 tablet (100 mg total) by mouth 3 (three) times daily. 09/15/16  Yes Charlene Brooke Nche, NP  isosorbide dinitrate (ISORDIL) 30 MG tablet Take 1 tablet (30 mg total) by mouth 3 (three) times daily. 09/15/16  Yes Charlene Brooke Nche, NP  labetalol (NORMODYNE) 300 MG tablet Take 1 tablet (300 mg  total) by mouth 2 (two) times daily. 09/15/16  Yes Flossie Buffy, NP  Multiple Vitamin (MULTIVITAMIN WITH MINERALS) TABS tablet Take 1 tablet by mouth daily. 09/15/16  Yes Charlene Brooke Nche, NP  potassium chloride SA (K-DUR,KLOR-CON) 20 MEQ tablet Take 1 tablet (20 mEq total) by mouth 2 (two) times daily. 09/15/16  Yes Charlene Brooke Nche, NP  famotidine (PEPCID) 20 MG tablet Take 1 tablet (20 mg total) by mouth 2 (two) times daily. Patient not taking: Reported on 09/15/2016 09/15/16   Flossie Buffy, NP    Inpatient medications: . potassium chloride  40 mEq Oral Once   . sodium chloride 100 mL/hr (09/15/16 1757)    Review of Systems Neg except as in HPI  Physical Exam:  Blood pressure 143/74, pulse 98, temperature 98.2 F (36.8 C), temperature source Oral, resp. rate 19, height _0  (1.651 m), weight (!) 149.2 kg (329 lb), SpO2 94 %, unknown if currently breastfeeding.  Gen: Very nice SMO AAF.  Uremic fetor to breath.  Looks well, and NAD Skin no rashes Capped trach Chest entirely clear to auscultation without crackles or wheezes Large neck, cannot see neck veins  Heart: S1S2 No S3 No murmur and no rub Abdomen: soft, non-distended. Obese. No focal tenderness. + BS Ext: No edema whatsoever Alert, Ox3, no focal deficit, no asterixus   Recent Labs Lab 09/15/16 0855 09/15/16 1154  NA 134* 134*  K 3.0* 3.2*  CL 94* 97*  CO2 21 18*  GLUCOSE 134* 127*  BUN 112* 120*  CREATININE 10.25* 10.76*  CALCIUM 9.6 9.4    Recent Labs Lab 09/15/16 0855 09/15/16 1154  AST 23 24  ALT 18 21  ALKPHOS 160* 151*  BILITOT 1.4* 1.6*  PROT 8.5* 8.3*  ALBUMIN 3.2* 2.6*   Recent Labs Lab 09/15/16 1154  WBC 13.7*  HGB 11.6*  HCT 33.1*  MCV 88.7  PLT 355    Xrays/Other Studies: Dg Chest 2 View  Result Date: 09/15/2016 CLINICAL DATA:  URI. EXAM: CHEST  2 VIEW COMPARISON:  06/28/2016 . FINDINGS: Tracheostomy tube noted in good anatomic position. Cardiomegaly with mild  pulmonary vascular prominence and bilateral interstitial prominence consistent congestive heart failure. Interstitial pneumonitis cannot be excluded. No pleural effusion or pneumothorax. IMPRESSION: 1. Tracheostomy tube noted good anatomic position. 2. Cardiomegaly with mild pulmonary vascular prominence and bilateral interstitial prominence consistent with CHF. Interstitial pneumonitis cannot be excluded . Electronically Signed   By: Marcello Moores  Register   On: 09/15/2016 13:57   US Renal  Result Date: 09/15/2016 CLINICAL DATA:  Acute kidney injury.  UTI.  Hypertension.  Diabetes. EXAM: RENAL / URINARY TRACT ULTRASOUND COMPLETE COMPARISON:  None. FINDINGS: Right Kidney: Length: 14.9 cm. Prominent cortical thickness. This could be secondary to diabetic glomerulonephropathy or acute nephritis. No hydronephrosis. No focal lesion. Left  Kidney: Length: 13.3 cm. Prominent cortical thickness. This could be secondary to diabetic glomerulonephropathy or acute nephritis. No hydronephrosis. No focal lesion. Bladder: Appears normal for degree of bladder distention. IMPRESSION: Bilateral renal enlargement, more on the right than the left. Prominent cortical thickness. This could be secondary to diabetic glomerulonephropathy or acute nephritis. Electronically Signed   By: Nelson Chimes M.D.   On: 09/15/2016 16:40    Background: 36 y.o. year-old AAF with PMH sig for super morbid opbesity, hypertension with h/o HT urgencies and poor control 2/2 non-compliance, OSA/OHV with chronic hypercapnic resp failure (s/p prolonged hospitalization 05/2016-06/2016 requiring trach 05/2016) and now with chronic trach, dCHF,  H/o gestational DM. Baseline creatinine has varied from as low as 0.86 to as high as 1.69 over past 4 months, and was 1.13 as recently as 07/12/16.  Went to primary care to get BP meds refilled (was out of her lasix, labetalol, isosorbide, Klor Con, had only a bottle of hydralazine), had labs drawn, creatinine returned at  10.25 - was sent to the ED - labs repeated - confirmed creatinine 10.76. UA looked infected, but also with new proteinuria (neg on UA 05/2016) and UPC of 3.29 gm.  UNa 30 with FeNa of 2.2% so not c/w pre-renal or Na avid state. No ACE, ARB, NSAID or PPI. Renal asked to evaluate.   Assessment/Recommendations  1. Renal failure - new since November. No ACE, ARB, NSAID or PPI.  1. Has near nephrotic proteinuria by UPC (new since Oct), microscopic hematuria, large kidneys on ultrasound.  2. Other labs of note high total protein with high glob to alb ratio, and high normal corrected Ca.  3. Could have been somewhat volume depleted d/t recent diarrheal illness, but no documented hypotension (checks BP at home) and no symptoms of same.  4. High FeNa speaks against a prerenal state.  5. No objection to IVF but would be judicious given her severe OSA/OHV and dCHF history 6. Will cast out a rather wide net serologically, but will probably require renal biopsy. 7. Picture suspicious for glomerulonephritis; also with elevated TP vs alb and large kidneys need to exclude infiltrative/dysproteinemic process  8. No dialysis indications tonight - reassess in the AM  2. Hypokalemia - Not expected with AKI (and has been out of her lasix and KlorCon for several days).  1. Give 40 po once tonight. 2. Recheck in the AM 3. Diarrhea - has resolved 4. Cough/URI - resp panel 5. Hypertension with H/O HT urgencies and severe HTN (no RAS by Duplex 2012)- normotensive despite being out of labetolol, isosorbide, furosemide for 3+ days (only had hydralazine in her bag) 1. Be careful starting back all home meds 2. Monitor BP carefully 6. OSA/OHV - chronic trach 7. H/o dCHF 8. Metabolic acidosis 1. S/P amp of bicarb in the ED 9. Pyuria/bacteruria  1. Primary has ordered rocephin 2. Urine culture pending  Jamal Maes,  MD [facility] [phone] pager 09/15/2016, 4:53 PM

## 2016-09-15 NOTE — ED Notes (Signed)
Unsuccessful attempt at foley insertion.  Patient was in a lot of pain and wanted it taken out.  When trying second attempt, ED had nomore foleys.  Waiting on supply to come up.

## 2016-09-15 NOTE — ED Notes (Signed)
Asked Gabriel Cirri, AD regarding if foley insertion for patient is necessary being placed in ED.  She stated that since patient had own bathroom in room and is able to ambulate, then it would be best to place a hat in toilet to catch urine instead of placing foley.  If foley needed to be placed once the patient is tranferred to floor,then the floor nurse could do it at that time.  She told me to let doctor know the situation of why it may be best to wait to place foley.  EDP aware and had talk with Gabriel Cirri and they came to the conclusion that foley can be placed in the ED.

## 2016-09-15 NOTE — ED Notes (Signed)
Pt aware of needing a stool sample next time she has a bowel movement.

## 2016-09-15 NOTE — ED Notes (Signed)
Paged [facility]

## 2016-09-15 NOTE — Telephone Encounter (Signed)
Per charlotte, patient needs to go to [facility] now for acute kidney injury---patient advised of lab values and advised to go to [place] now---she is at risk for kidney injury including kidneys shutting down---patient stated she would try to get baby sitter for her child and go as soon as possible

## 2016-09-15 NOTE — ED Notes (Signed)
Bed placement called.  Room is not clean on 5W and unable to have patient moved up from ED.  Bed placement will call when room is clean.

## 2016-09-15 NOTE — ED Notes (Addendum)
5W nurse called and room is now clean. Getting ready to transport.

## 2016-09-15 NOTE — ED Provider Notes (Signed)
[facility] DEPT Provider Note   CSN: [id number] Arrival date & time: 09/15/16  1137     History   Chief Complaint Chief Complaint  Patient presents with  . Abnormal Lab    HPI [name] [name] is a 36 y.o. female.  HPI   36 year old morbidly obese female with history of hypertension, diabetes, sent here from primary care provider for evaluation of abnormal renal function. Patient was seen by her PCP for the first time today to establish care as well as checking her blood pressure. She has history of hypertension. Her labs significant with elevated BUN and creatinine. She has no prior history of renal disease. She was sent here for further evaluation. Patient mention for the past for 5 days she has had mild abdominal discomfort, mild dysuria initially follows with bouts of nausea, has vomited 3 separate occasions several days prior has been having persistent diarrhea. She quantify 5-7 bouts of loose stools daily. Endorse occasional cough productive with small amount of phlegm. She denies any specific treatment tried. The symptoms are improving. She went to her PCP today primarily for a checkup. Was hospitalized in November 2017 with acute diastolic heart failure and hypoxemia. She also had a tracheostomy placed in November secondary to sleep apnea, currently use CPAP at home  10/4 Echocardiogram:LVEF=: 50%to 55%.-- Left atrium: mildly dilated.-Right atrium: mildly dilated. - Pulmonary arteries PApeak pressure: 42 mm Hg (s).  Past Medical History:  Diagnosis Date  . Diabetes mellitus without complication (HCC)    GDM  . Gestational diabetes mellitus in pregnancy 05/24/2013  . HTN in pregnancy, chronic 05/24/2013  . Hypertension   . Morbid obesity ([place])   . Sinus tachycardia     Patient Active Problem List   Diagnosis Date Noted  . Chronic diastolic congestive heart failure ([place])   . Dysphagia   . Urinary retention   . Benign essential HTN   . AKI (acute kidney injury)  ([place])   . Hypoalbuminemia due to protein-calorie malnutrition ([place])   . Debilitated 06/29/2016  . Demand ischemia ([place])   . Acute on chronic respiratory failure with hypoxia ([place])   . Moderate single current episode of major depressive disorder ([place])   . Uncontrolled type 2 diabetes mellitus with complication ([place])   . Tracheostomy status ([place])   . Acute diastolic CHF (congestive heart failure) ([place])   . Hypertensive urgency   . Obesity hypoventilation syndrome ([place])   . Acute kidney injury ([place])   . Acute on chronic respiratory failure with hypercapnia ([place])   . Acute respiratory failure with hypoxia ([place])   . HCAP (healthcare-associated pneumonia)   . Sinus pause 06/07/2016  . Difficult intravenous access   . Acute respiratory failure with hypoxemia ([place])   . Hepatic congestion 05/26/2016  . Sinus tachycardia 05/26/2016  . Pericardial effusion 05/26/2016  . Pulmonary hypertension   . Renal insufficiency 05/25/2016  . Abnormal EKG 05/25/2016  . Acute pulmonary edema ([place]) 05/25/2016  . Abdominal pain 05/25/2016  . CHF (congestive heart failure) ([place]) 05/25/2016  . Acute congestive heart failure ([place]) 05/25/2016  . Oligohydramnios, delivered 05/27/2013  . NSVD (normal spontaneous vaginal delivery) 05/27/2013  . Pregnancy 11/02/2012  . OSA (obstructive sleep apnea) 07/09/2011  . 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 Morbid obesity ([place]) 05/18/2011    Past Surgical History:  Procedure Laterality Date  . TRACHEOSTOMY TUBE PLACEMENT N/A 06/11/2016   Procedure: TRACHEOSTOMY;  Surgeon: Melida Quitter, MD;  Location: [facility];  Service: ENT;  Laterality: N/A;  OB History    Gravida Para Term Preterm AB Living   _0 0 0 1   SAB TAB Ectopic Multiple Live Births   0 0 0 0 1       Home Medications    Prior to Admission medications   Medication Sig Start Date End Date Taking? Authorizing Provider  famotidine (PEPCID) 20 MG tablet Take 1 tablet (20 mg total) by mouth 2 (two)  times daily. 09/15/16   Flossie Buffy, NP  furosemide (LASIX) 80 MG tablet Take 1 tablet (80 mg total) by mouth 3 (three) times daily. 09/15/16   Flossie Buffy, NP  hydrALAZINE (APRESOLINE) 100 MG tablet Take 1 tablet (100 mg total) by mouth 3 (three) times daily. 09/15/16   Flossie Buffy, NP  isosorbide dinitrate (ISORDIL) 30 MG tablet Take 1 tablet (30 mg total) by mouth 3 (three) times daily. 09/15/16   Flossie Buffy, NP  labetalol (NORMODYNE) 300 MG tablet Take 1 tablet (300 mg total) by mouth 2 (two) times daily. 09/15/16   Flossie Buffy, NP  Multiple Vitamin (MULTIVITAMIN WITH MINERALS) TABS tablet Take 1 tablet by mouth daily. 09/15/16   Flossie Buffy, NP  potassium chloride SA (K-DUR,KLOR-CON) 20 MEQ tablet Take 1 tablet (20 mEq total) by mouth 2 (two) times daily. 09/15/16   Flossie Buffy, NP    Family History Family History  Problem Relation Age of Onset  . Hypertension Mother   . Diabetes Other   . Birth defects Paternal Grandfather     unknown type    Social History Social History  Substance Use Topics  . Smoking status: Never Smoker  . Smokeless tobacco: Never Used  . Alcohol use No     Allergies   No known allergies   Review of Systems Review of Systems  All other systems reviewed and are negative.    Physical Exam Updated Vital Signs BP 137/100 (BP Location: Right Wrist)   Pulse 107   Temp 98.2 F (36.8 C) (Oral)   Resp 20   Ht 5' 5" (1.651 m)   Wt (!) 149.2 kg   SpO2 95%   BMI 54.75 kg/m   Physical Exam  Constitutional: She is oriented to person, place, and time. She appears well-developed and well-nourished. No distress.  Obese female well-appearing in no acute distress.  HENT:  Head: Atraumatic.  Right Ear: External ear normal.  Left Ear: External ear normal.  Mouth/Throat: Oropharynx is clear and moist.  Eyes: Conjunctivae are normal.  Neck: Normal range of motion. Neck supple. No JVD present.  Trache with  Caryl Never valve in place  Cardiovascular: Normal rate and regular rhythm.  Exam reveals no gallop and no friction rub.   No murmur heard. Pulmonary/Chest: Effort normal. No stridor. No respiratory distress. She has no wheezes. She has rales (Faint crackles heard at the base).  Abdominal: Soft. She exhibits no distension. There is no tenderness.  Genitourinary:  Genitourinary Comments: No CVA tenderness  Neurological: She is alert and oriented to person, place, and time.  Skin: No rash noted.  Psychiatric: She has a normal mood and affect.  Nursing note and vitals reviewed.    ED Treatments / Results  Labs (all labs ordered are listed, but only abnormal results are displayed) Labs Reviewed  COMPREHENSIVE METABOLIC PANEL - Abnormal; Notable for the following:       Result Value   Sodium 134 (*)    Potassium 3.2 (*)  Chloride 97 (*)    CO2 18 (*)    Glucose, Bld 127 (*)    BUN 120 (*)    Creatinine, Ser 10.76 (*)    Total Protein 8.3 (*)    Albumin 2.6 (*)    Alkaline Phosphatase 151 (*)    Total Bilirubin 1.6 (*)    GFR calc non Af Amer 4 (*)    GFR calc Af Amer 5 (*)    Anion gap 19 (*)    All other components within normal limits  CBC - Abnormal; Notable for the following:    WBC 13.7 (*)    RBC 3.73 (*)    Hemoglobin 11.6 (*)    HCT 33.1 (*)    RDW 15.7 (*)    All other components within normal limits  URINALYSIS, ROUTINE W REFLEX MICROSCOPIC - Abnormal; Notable for the following:    Color, Urine AMBER (*)    APPearance CLOUDY (*)    Hgb urine dipstick MODERATE (*)    Protein, ur 100 (*)    Leukocytes, UA LARGE (*)    Bacteria, UA MANY (*)    Squamous Epithelial / LPF 6-30 (*)    All other components within normal limits  URINE CULTURE  BRAIN NATRIURETIC PEPTIDE  PROTEIN / CREATININE RATIO, URINE  SODIUM, URINE, RANDOM  I-STAT BETA HCG BLOOD, ED ([facility], [facility], [facility] ONLY)    EKG  EKG Interpretation None       Radiology Dg Chest 2 View  Result Date:  09/15/2016 CLINICAL DATA:  URI. EXAM: CHEST  2 VIEW COMPARISON:  06/28/2016 . FINDINGS: Tracheostomy tube noted in good anatomic position. Cardiomegaly with mild pulmonary vascular prominence and bilateral interstitial prominence consistent congestive heart failure. Interstitial pneumonitis cannot be excluded. No pleural effusion or pneumothorax. IMPRESSION: 1. Tracheostomy tube noted good anatomic position. 2. Cardiomegaly with mild pulmonary vascular prominence and bilateral interstitial prominence consistent with CHF. Interstitial pneumonitis cannot be excluded . Electronically Signed   By: Marcello Moores  Register   On: 09/15/2016 13:57    Procedures Procedures (including critical care time)  Medications Ordered in ED Medications  sodium chloride 0.9 % bolus 1,000 mL (1,000 mLs Intravenous New Bag/Given 09/15/16 1421)  cefTRIAXone (ROCEPHIN) 1 g in dextrose 5 % 50 mL IVPB (1 g Intravenous New Bag/Given 09/15/16 1426)     Initial Impression / Assessment and Plan / ED Course  I have reviewed the triage vital signs and the nursing notes.  Pertinent labs & imaging results that were available during my care of the patient were reviewed by me and considered in my medical decision making (see chart for details).     BP 143/74 (BP Location: Left Arm)   Pulse 98   Temp 98.2 F (36.8 C) (Oral)   Resp 19   Ht _0  (1.651 m)   Wt (!) 149.2 kg   SpO2 94%   BMI 54.75 kg/m    Final Clinical Impressions(s) / ED Diagnoses   Final diagnoses:  Lower urinary tract infectious disease  Acute renal failure, unspecified acute renal failure type (HCC)    New Prescriptions New Prescriptions   No medications on file   BP 160/96 (BP Location: Right Arm)   Pulse 99   Temp 98.2 F (36.8 C) (Oral)   Resp 21   Ht _1  (1.651 m)   Wt (!) 149.2 kg   SpO2 94%   BMI 54.75 kg/m   2:12 PM Patient sent here for further evaluation off evidence of  HAI. Her last creatinine BUN that was documented prior to  this was in November and it was within normal limits. Today, BUN 120, Cr 1.17 concerning for AKI.  She does have hx of CHF which further complicated treatment.  She endorse n/v/d and dysuria which may suggest dehydration causing AKI.  Care discussed with Dr. Vanita Panda.  2:24 PM Appreciate consultation from oncall nephrologist Dr. Lorrene Reid who recommend checking urine protein/creatinine ration, as well as sodium/creatinine ratio, along with placing a foley, obtain a renal US and to have medicine to admit pt.  Pt may need dialysis.  She also recommend IVF hydration.    3:21 PM I have consulted [facility] and spoke with PA Ebony Hail who agrees to see pt in the ER and will admit for further care.  Pt is aware of plan.  Pt was on Lasix but sts she hasn't taking it for the past 4-5 days when she ran out of her meds.    Domenic Moras, PA-C 09/15/16 [place], MD 09/15/16 1728

## 2016-09-15 NOTE — ED Triage Notes (Signed)
Pt reports recent URI, went to pcp office for regular checkup and sent due to abnormal renal function. Pt has no complaints. No distress noted at triage.

## 2016-09-15 NOTE — H&P (Signed)
History and Physical    [name] [name] XLK:[id number] DOB: [birth year] DOA: 09/15/2016   PCP: Wilfred Lacy, NP   Patient coming from/Resides with: Private residence/has been living with her mother since previous hospitalization in October 2017  Admission status: Inpatient/telemetry -medically necessary to stay a minimum 2 midnights to rule out impending and/or unexpected changes in physiologic status that may differ from initial evaluation performed in the ER and/or at time of admission. Patient presents with acute kidney injury in setting of recent diarrhea and upper respiratory infection symptoms and associated UTI. Marked elevation in serum creatinine presumably related to dehydration in setting of continued use of antihypertensives and diuretics. Patient required IV fluid hydration with close monitoring of volume status and respiratory status given history of chronic kidney disease as well as chronic diastolic heart failure. She will require frequent lab draws until creatinine has normalized.  Chief Complaint: Abnormal labs  HPI: [name] [name] is a 36 y.o. female with medical history significant for OSA/OHS with associated chronic hypercapnic respiratory failure with tracheostomy since October 2017, history of poorly controlled hypertension, known chronic kidney disease, chronic diastolic heart failure/pulmonary hypertension as well as morbid obesity. She was discharged from the hospital on 11/13 after an admission for progressive shortness of breath with O2 saturations in the 70s. Symptoms felt related to obesity hypoventilation syndrome with subsequent tracheostomy tube placement on 06/11/2016. She was also diagnosed with diastolic congestive heart failure and started on appropriate medications. At time of admission her blood pressure was markedly uncontrolled with a reading of 215/151. Since discharge patient has had issues with poorly controlled blood pressure prompting adjustments in  medications. She has followed up with the trach clinic and is interested in being decannulated. She presented to [facility] as a new patient evaluation. Unfortunately her labs returned with significant acute kidney injury with a BUN of 111 and a creatinine of 10.25 noting in November her BUN was 26 and creatinine was 1.13. Cause of abnormal labs she was sent to the ER for further evaluation and treatment  In further discussion with the patient, she has been experiencing nonbloody multiple episodes of loose to watery diarrhea for 3-4 days. No abdominal pain. She has also been having a cough with clear productive sputum, no fevers or chills, for about 2 weeks. She reports normal urinary output. She is also been experiencing dysuria. She has been taking her medications as prescribed including her diuretics. It appears though recently she did run out of her diuretics. She reports unknown amount of weight loss since developing diarrhea and URI symptoms.  ED Course:  Vital Signs: BP 143/74 (BP Location: Left Arm)   Pulse 98   Temp 98.2 F (36.8 C) (Oral)   Resp 19   Ht _0  (1.651 m)   Wt (!) 149.2 kg (329 lb)   SpO2 94%   BMI 54.75 kg/m  2 view chest x-ray: Mild pulmonary vascular prominence consistent with either CHF or interstitial pneumonitis Lab data: Sodium 134, potassium 3.2, chloride 97, CO2 18, anion gap 19, glucose 127, BUN 120, creatinine 10.76, alk phosphatase 151, LFTs normal except for mildly elevated total bilirubin 1.6, BNP 175, white count 13,700 differential not obtained, hemoglobin 11.6, platelets 355,000, urinalysis abnormal as follows: Cloudy appearance, many bacteria, and color, moderate hemoglobin, large leukocytes, nitrite negative, WBCs too numerous to count, squamous epithelials 6-30, urine culture obtained. I-STAT HCD quantitative less than 5 Medications and treatments: Normal saline bolus 1 L, Rocephin 1 g IV 1  Review of  Systems:  In addition to the HPI above,    No Fever-chills, myalgias or other constitutional symptoms No Headache, changes with Vision or hearing, new weakness, tingling, numbness in any extremity, dizziness, dysarthria or word finding difficulty, gait disturbance or imbalance, tremors or seizure activity No problems swallowing food or Liquids, indigestion/reflux, choking or coughing while eating, abdominal pain with or after eating No Chest pain, Cough or Shortness of Breath, palpitations, orthopnea or DOE No Abdominal pain, N/V, melena,hematochezia, dark tarry stools No dysuria, malodorous urine, hematuria or flank pain No new skin rashes, lesions, masses or bruises, No new joint pains, aches, swelling or redness No recent unintentional weight gain  No polyuria, polydypsia or polyphagia   Past Medical History:  Diagnosis Date  . Diabetes mellitus without complication (HCC)    GDM  . Gestational diabetes mellitus in pregnancy 05/24/2013  . HTN in pregnancy, chronic 05/24/2013  . Hypertension   . Morbid obesity ([place])   . Sinus tachycardia     Past Surgical History:  Procedure Laterality Date  . TRACHEOSTOMY TUBE PLACEMENT N/A 06/11/2016   Procedure: TRACHEOSTOMY;  Surgeon: Melida Quitter, MD;  Location: [place];  Service: ENT;  Laterality: N/A;    Social History   Social History  . Marital status: Single    Spouse name: N/A  . Number of children: N/A  . Years of education: N/A   Occupational History  . 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 Smoking status: Never Smoker  . Smokeless tobacco: Never Used  . Alcohol use No  . Drug use: No  . Sexual activity: Not Currently    Birth control/ protection: IUD   Other Topics Concern  . 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 No Known Allergies     Family History  Problem Relation Age of Onset  . Hypertension Mother   . Diabetes Other   . Birth defects Paternal Grandfather     unknown type     Prior to Admission medications   Medication Sig Start Date End Date Taking? Authorizing Provider  furosemide (LASIX) 80 MG tablet Take 1 tablet (80 mg total) by mouth 3 (three) times daily. 09/15/16  Yes Charlene Brooke Nche, NP  hydrALAZINE (APRESOLINE) 100 MG tablet Take 1 tablet (100 mg total) by mouth 3 (three) times daily. 09/15/16  Yes Charlene Brooke Nche, NP  isosorbide dinitrate (ISORDIL) 30 MG tablet Take 1 tablet (30 mg total) by mouth 3 (three) times daily. 09/15/16  Yes Charlene Brooke Nche, NP  labetalol (NORMODYNE) 300 MG tablet Take 1 tablet (300 mg total) by mouth 2 (two) times daily. 09/15/16  Yes Flossie Buffy, NP  Multiple Vitamin (MULTIVITAMIN WITH MINERALS) TABS tablet Take 1 tablet by mouth daily. 09/15/16  Yes Charlene Brooke Nche, NP  potassium chloride SA (K-DUR,KLOR-CON) 20 MEQ tablet Take 1 tablet (20 mEq total) by mouth 2 (two) times daily. 09/15/16  Yes Charlene Brooke Nche, NP  famotidine (PEPCID) 20 MG tablet Take 1 tablet (20 mg total) by mouth 2 (two) times daily. Patient not taking:  Reported on 09/15/2016 09/15/16   Flossie Buffy, NP    Physical Exam: Vitals:   09/15/16 1148 09/15/16 1359 09/15/16 1511  BP: 137/100 160/96 143/74  Pulse: 107 99 98  Resp: _0 Temp: 98.2 F (36.8 C)    TempSrc: Oral    SpO2: 95% 94% 94%  Weight: (!) 149.2 kg (329 lb)    Height: _1  (1.651 m)        Constitutional: NAD, calm, comfortable Eyes: PERRL, lids and conjunctivae normal ENMT: Mucous membranes are dry. Posterior pharynx clear of any exudate or lesions.Normal dentition.  Neck: normal, supple, no masses, no thyromegaly Respiratory: clear to  auscultation bilaterally, no wheezing, no crackles. Normal respiratory effort. No accessory muscle use.  Cardiovascular: Regular rate and rhythm, no murmurs / rubs / gallops. No extremity edema. 2+ pedal pulses. No carotid bruits.  Abdomen: no tenderness, no masses palpated. No hepatosplenomegaly. Bowel sounds positive.  Musculoskeletal: no clubbing / cyanosis. No joint deformity upper and lower extremities. Good ROM, no contractures. Normal muscle tone.  Skin: no rashes, lesions, ulcers. No induration Neurologic: CN 2-12 grossly intact. Sensation intact, DTR normal. Strength 5/5 x all 4 extremities.  Psychiatric: Normal judgment and insight. Alert and oriented x 3. Normal mood.    Labs on Admission: I have personally reviewed following labs and imaging studies  CBC:  Recent Labs Lab 09/15/16 1154  WBC 13.7*  HGB 11.6*  HCT 33.1*  MCV 88.7  PLT 932   Basic Metabolic Panel:  Recent Labs Lab 09/15/16 0855 09/15/16 1154  NA 134* 134*  K 3.0* 3.2*  CL 94* 97*  CO2 21 18*  GLUCOSE 134* 127*  BUN 112* 120*  CREATININE 10.25* 10.76*  CALCIUM 9.6 9.4   GFR: Estimated Creatinine Clearance: 10.6 mL/min (by C-G formula based on SCr of 10.76 mg/dL (H)). Liver Function Tests:  Recent Labs Lab 09/15/16 0855 09/15/16 1154  AST 23 24  ALT 18 21  ALKPHOS 160* 151*  BILITOT 1.4* 1.6*  PROT 8.5* 8.3*  ALBUMIN 3.2* 2.6*   No results for input(s): LIPASE, AMYLASE in the last 168 hours. No results for input(s): AMMONIA in the last 168 hours. Coagulation Profile: No results for input(s): INR, PROTIME in the last 168 hours. Cardiac Enzymes: No results for input(s): CKTOTAL, CKMB, CKMBINDEX, TROPONINI in the last 168 hours. BNP (last 3 results) No results for input(s): PROBNP in the last 8760 hours. HbA1C:  Recent Labs  09/15/16 0855  HGBA1C 6.2   CBG: No results for input(s): GLUCAP in the last 168 hours. Lipid Profile:  Recent Labs  09/15/16 0855  CHOL 176  HDL  10.60*  TRIG 250.0*  CHOLHDL 17  LDLDIRECT 99.0   Thyroid Function Tests: No results for input(s): TSH, T4TOTAL, FREET4, T3FREE, THYROIDAB in the last 72 hours. Anemia Panel: No results for input(s): VITAMINB12, FOLATE, FERRITIN, TIBC, IRON, RETICCTPCT in the last 72 hours. Urine analysis:    Component Value Date/Time   COLORURINE AMBER (A) 09/15/2016 1317   APPEARANCEUR CLOUDY (A) 09/15/2016 1317   LABSPEC 1.012 09/15/2016 1317   PHURINE 5.0 09/15/2016 1317   GLUCOSEU NEGATIVE 09/15/2016 1317   HGBUR MODERATE (A) 09/15/2016 1317   BILIRUBINUR NEGATIVE 09/15/2016 1317   KETONESUR NEGATIVE 09/15/2016 1317   PROTEINUR 100 (A) 09/15/2016 1317   UROBILINOGEN 1.0 12/12/2013 2146   NITRITE NEGATIVE 09/15/2016 1317   LEUKOCYTESUR LARGE (A) 09/15/2016 1317   Sepsis Labs: _2 (procalcitonin:4,lacticidven:4) )No results found for this or any previous visit (  from the past 240 hour(s)).   Radiological Exams on Admission: Dg Chest 2 View  Result Date: 09/15/2016 CLINICAL DATA:  URI. EXAM: CHEST  2 VIEW COMPARISON:  06/28/2016 . FINDINGS: Tracheostomy tube noted in good anatomic position. Cardiomegaly with mild pulmonary vascular prominence and bilateral interstitial prominence consistent congestive heart failure. Interstitial pneumonitis cannot be excluded. No pleural effusion or pneumothorax. IMPRESSION: 1. Tracheostomy tube noted good anatomic position. 2. Cardiomegaly with mild pulmonary vascular prominence and bilateral interstitial prominence consistent with CHF. Interstitial pneumonitis cannot be excluded . Electronically Signed   By: Marcello Moores  Register   On: 09/15/2016 13:57     Assessment/Plan Principal Problem:   Acute renal failure superimposed on stage 3 chronic kidney disease, not on chronic dialysis  -Patient presents with acute kidney injury in setting of known stage III chronic kidney disease with current GFR 5 in setting of recent upper respiratory infection symptoms as  well as diarrhea with concomitant use of diuretics -Suspect dehydration as primary etiological symptom -Hold diuretics -Received 1 L bolus in the ER; continue IV fluids at 100 per hour -Has associated mild metabolic acidosis I will give 1 amp of sodium bicarbonate -Follow labs -Renal ultrasound -Nephrology consulted  Active Problems:   Malignant hypertension -Recent issues with poor control in the outpatient setting -Current blood pressures controlled in setting of dehydration -Continue preadmission hydralazine, Isordil and labetalol    Diarrhea -Patient reports 3-4 days of significant high volume diarrhea without blood -Gastrointestinal panel    Pulmonary hypertension/Chronic diastolic heart failure  -Appears compensated -Diuretic on hold -See below -Continue nitrate    Pneumonitis -Patient reports 2 weeks of cough with clear sputum without fevers or chills -Suspect viral syndrome -Check respiratory viral panel    Abnormal urinalysis -Suspicious for UTI given reports of dysuria -Continue Rocephin given in ER -Follow up on urine culture -Check blood cultures    Chronic respiratory failure with hypercapnia/tracheostomy status 2/2   Morbid obesity/OSA (obstructive sleep apnea) with Obesity hypoventilation syndrome  -Followed as an outpatient at the tracheostomy clinic -Continue CPAP (??via mask vs thru trach) -Outpatient appointment scheduled with Dr. Eartha Inch on 1/29 to discuss tracheostomy revision/possible decannulation -Currently not hypoxic    DVT prophylaxis: Subcutaneous heparin Code Status: Full Family Communication: Family member bedside with patient permission  Disposition Plan: Anticipate discharge back to preadmission home environment when medically stable Consults called: Nephrologist on call/??Rae Halsted ANP-BC [facility] Pager [phone]   If 7PM-7AM, please contact night-coverage [email or web address] Password [facility]  09/15/2016,  4:24 PM

## 2016-09-15 NOTE — Progress Notes (Signed)
Pre visit review using our clinic review tool, if applicable. No additional management support is needed unless otherwise documented below in the visit note.

## 2016-09-15 NOTE — Patient Instructions (Addendum)
Patient advised to go to nearest ED today. She verbalized understanding and agreed to go to hospital.  Heart Failure Heart failure means your heart has trouble pumping blood. This makes it hard for your body to work well. Heart failure is usually a long-term (chronic) condition. You must take good care of yourself and follow your doctor's treatment plan. HOME CARE  Take your heart medicine as told by your doctor.  Do not stop taking medicine unless your doctor tells you to.  Do not skip any dose of medicine.  Refill your medicines before they run out.  Take other medicines only as told by your doctor or pharmacist.  Stay active if told by your doctor. The elderly and people with severe heart failure should talk with a doctor about physical activity.  Eat heart-healthy foods. Choose foods that are without trans fat and are low in saturated fat, cholesterol, and salt (sodium). This includes fresh or frozen fruits and vegetables, fish, lean meats, fat-free or low-fat dairy foods, whole grains, and high-fiber foods. Lentils and dried peas and beans (legumes) are also good choices.  Limit salt if told by your doctor.  Cook in a healthy way. Roast, grill, broil, bake, poach, steam, or stir-fry foods.  Limit fluids as told by your doctor.  Weigh yourself every morning. Do this after you pee (urinate) and before you eat breakfast. Write down your weight to give to your doctor.  Take your blood pressure and write it down if your doctor tells you to.  Ask your doctor how to check your pulse. Check your pulse as told.  Lose weight if told by your doctor.  Stop smoking or chewing tobacco. Do not use gum or patches that help you quit without your doctor's approval.  Schedule and go to doctor visits as told.  Nonpregnant women should have no more than 1 drink a day. Men should have no more than 2 drinks a day. Talk to your doctor about drinking alcohol.  Stop illegal drug use.  Stay  current with shots (immunizations).  Manage your health conditions as told by your doctor.  Learn to manage your stress.  Rest when you are tired.  If it is really hot outside:  Avoid intense activities.  Use air conditioning or fans, or get in a cooler place.  Avoid caffeine and alcohol.  Wear loose-fitting, lightweight, and light-colored clothing.  If it is really cold outside:  Avoid intense activities.  Layer your clothing.  Wear mittens or gloves, a hat, and a scarf when going outside.  Avoid alcohol.  Learn about heart failure and get support as needed.  Get help to maintain or improve your quality of life and your ability to care for yourself as needed. GET HELP IF:   You gain weight quickly.  You are more short of breath than usual.  You cannot do your normal activities.  You tire easily.  You cough more than normal, especially with activity.  You have any or more puffiness (swelling) in areas such as your hands, feet, ankles, or belly (abdomen).  You cannot sleep because it is hard to breathe.  You feel like your heart is beating fast (palpitations).  You get dizzy or light-headed when you stand up. GET HELP RIGHT AWAY IF:   You have trouble breathing.  There is a change in mental status, such as becoming less alert or not being able to focus.  You have chest pain or discomfort.  You faint. MAKE SURE YOU:  Understand these instructions.  Will watch your condition.  Will get help right away if you are not doing well or get worse. This information is not intended to replace advice given to you by your health care provider. Make sure you discuss any questions you have with your health care provider. Document Released: 05/18/2008 Document Revised: 08/30/2014 Document Reviewed: 09/25/2012 Elsevier Interactive Patient Education  2017 [employer].

## 2016-09-15 NOTE — ED Notes (Signed)
[employer] did not print label.

## 2016-09-16 DIAGNOSIS — N179 Acute kidney failure, unspecified: Secondary | ICD-10-CM

## 2016-09-16 DIAGNOSIS — N390 Urinary tract infection, site not specified: Secondary | ICD-10-CM

## 2016-09-16 LAB — CBC
HCT: 29.8 % — ABNORMAL LOW (ref 36.0–46.0)
Hemoglobin: 9.9 g/dL — ABNORMAL LOW (ref 12.0–15.0)
MCH: 29.9 pg (ref 26.0–34.0)
MCHC: 33.2 g/dL (ref 30.0–36.0)
MCV: 90 fL (ref 78.0–100.0)
Platelets: 364 10*3/uL (ref 150–400)
RBC: 3.31 MIL/uL — ABNORMAL LOW (ref 3.87–5.11)
RDW: 15.6 % — ABNORMAL HIGH (ref 11.5–15.5)
WBC: 10.9 10*3/uL — ABNORMAL HIGH (ref 4.0–10.5)

## 2016-09-16 LAB — RESPIRATORY PANEL BY PCR

## 2016-09-16 LAB — COMPREHENSIVE METABOLIC PANEL
ALT: 16 U/L (ref 14–54)
AST: 18 U/L (ref 15–41)
Albumin: 2 g/dL — ABNORMAL LOW (ref 3.5–5.0)
Alkaline Phosphatase: 134 U/L — ABNORMAL HIGH (ref 38–126)
Anion gap: 15 (ref 5–15)
BUN: 121 mg/dL — ABNORMAL HIGH (ref 6–20)
CO2: 20 mmol/L — ABNORMAL LOW (ref 22–32)
Calcium: 8.9 mg/dL (ref 8.9–10.3)
Chloride: 102 mmol/L (ref 101–111)
Creatinine, Ser: 10.66 mg/dL — ABNORMAL HIGH (ref 0.44–1.00)
GFR calc Af Amer: 5 mL/min — ABNORMAL LOW (ref >60)
GFR calc non Af Amer: 4 mL/min — ABNORMAL LOW (ref >60)
Glucose, Bld: 116 mg/dL — ABNORMAL HIGH (ref 65–99)
Potassium: 3.4 mmol/L — ABNORMAL LOW (ref 3.5–5.1)
Sodium: 137 mmol/L (ref 135–145)
Total Bilirubin: 1.1 mg/dL (ref 0.3–1.2)
Total Protein: 7.4 g/dL (ref 6.5–8.1)

## 2016-09-16 LAB — PROTEIN ELECTROPHORESIS, SERUM
A/G Ratio: 0.5 — ABNORMAL LOW (ref 0.7–1.7)
Albumin ELP: 2.5 g/dL — ABNORMAL LOW (ref 2.9–4.4)
Alpha-1-Globulin: 0.5 g/dL — ABNORMAL HIGH (ref 0.0–0.4)
Alpha-2-Globulin: 1.3 g/dL — ABNORMAL HIGH (ref 0.4–1.0)
Beta Globulin: 1.3 g/dL (ref 0.7–1.3)
Gamma Globulin: 1.7 g/dL (ref 0.4–1.8)
Globulin, Total: 4.8 g/dL — ABNORMAL HIGH (ref 2.2–3.9)
PDF: 0
Total Protein ELP: 7.3 g/dL (ref 6.0–8.5)

## 2016-09-16 LAB — URINE CULTURE

## 2016-09-16 MED ORDER — LABETALOL HCL 100 MG PO TABS
100.0000 mg | ORAL_TABLET | Freq: Two times a day (BID) | ORAL | Status: DC
  Administered 2016-09-16 – 2016-09-23 (×13): 100 mg via ORAL
  Filled 2016-09-16 (×14): qty 1

## 2016-09-16 MED ORDER — ORAL CARE MOUTH RINSE
15.0000 mL | Freq: Two times a day (BID) | OROMUCOSAL | Status: DC
  Administered 2016-09-16 – 2016-09-23 (×13): 15 mL via OROMUCOSAL

## 2016-09-16 NOTE — Progress Notes (Signed)
4/4 bedrails up per patient request

## 2016-09-16 NOTE — Care Management Note (Addendum)
Case Management Note  Patient Details  Name: [name] [name] MRN: [id number] Date of Birth: 30-Jun-1981  Subjective/Objective:                 Patient admitted from home for A/CRF. Patient with trach (placed Oct 2017). 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 She states she is living with her mom in [name]. She states that an hour drive to her PCP is "no big deal for me." She states she drives herself to appointments. She is currently going to rehab as outpatient in [name] by her mom's house. She states that she wears her trach collar with oxygen provided through [name] only at night.  She states that she does not have CPAP at this time, but has a [facility] consultation at [name] on Jan 28th. Patient states that she has applied to medicaid, but was denied. CM reviewed with patient how to reapply/ appeal denial by calling going to [name].  Patient denies barriers to obtaining medications. States that she could not get Rx for refills until she saw new PCP yesterday. Several communications between patient and MD offices noted in Chart Review. Patient provides: PCP: [name] [name]. Nche NP (first visit was on day of admission) Pharmacy [name]   Action/Plan:  Patient 's [name] expired, watch for [name] letter needs, most meds likely to be on $4 list from what can be assessed at this time.  Expected Discharge Date:  09/17/16               Expected Discharge Plan:  Home/Self Care  In-House Referral:     Discharge planning Services  CM Consult  Post Acute Care Choice:    Choice offered to:     DME Arranged:    DME Agency:     HH Arranged:    HH Agency:     Status of Service:  In process, will continue to follow  If discussed at Long Length of Stay Meetings, dates discussed:    Additional Comments:  [name] Collet, RN 09/16/2016, 3:18 PM

## 2016-09-16 NOTE — Progress Notes (Signed)
Patient is a trach and wears ATC QHS.

## 2016-09-16 NOTE — Progress Notes (Signed)
PROGRESS NOTE    [name] [name]  DPO:[id number] DOB: Mar 24, 1981 DOA: 09/15/2016 PCP: Wilfred Lacy, NP   Brief Narrative:  [name] [name] is a 36 y.o. female who presents with acute on chronic renal insufficiency possibly secondary to dehydration from poor oral intake and diarrhea and medications in the setting of a UTI   Assessment & Plan:   Principal Problem:   Acute renal failure superimposed on stage 3 chronic kidney disease ([place]) Active Problems:   Malignant hypertension   Morbid obesity (HCC)   OSA (obstructive sleep apnea)   Pulmonary hypertension   Chronic respiratory failure with hypercapnia (HCC)   Tracheostomy status (HCC)   Chronic diastolic heart failure (HCC)   Obesity hypoventilation syndrome (HCC)   Pneumonitis   Diarrhea   Abnormal urinalysis   Acute renal failure (HCC)   Lower urinary tract infectious disease  Acute renal failure superimposed on stage 3 chronic kidney disease, not on chronic dialysis  -Patient presents with acute kidney injury in setting of known stage III chronic kidney disease with current GFR 5 in setting of recent upper respiratory infection symptoms as well as diarrhea with concomitant use of diuretics -hold diuretics, gentle hydration. Possible kidney biopsy and HD - defer to nephro  -Follow daily labs at this time -Renal ultrasound  pending -Nephrology consulted   Malignant hypertension - now in normal range  -Recent issues with poor control in the outpatient setting -Current blood pressures controlled  -Continue preadmission hydralazine, Isordil and labetalol    Diarrhea -no further episodes at this time -if has diarrhea, will need GI panel     Pulmonary hypertension/Chronic diastolic heart failure  -Appears compensated -Diuretic on hold -Continue nitrate    Pneumonitis -unknown etiology. Resp Panel neg -supportive care    Abnormal urinalysis -Suspicious for UTI given reports of dysuria -Continue Rocephin given in  ER -Follow up on urine culture     Chronic respiratory failure with hypercapnia/tracheostomy status 2/2   Morbid obesity/OSA (obstructive sleep apnea) with Obesity hypoventilation syndrome  -Followed as an outpatient at the tracheostomy clinic -Continue CPAP (??via mask vs thru trach) -Outpatient appointment scheduled with Dr. Eartha Inch on 1/29 to discuss tracheostomy revision/possible decannulation -Currently not hypoxic    DVT prophylaxis: Subcutaneous heparin Code Status: Full Family Communication: Family member bedside with patient permission  Disposition Plan: Cont Patient stay at this time.  Consults called: Dr Lorrene Reid from Nephro following.   Consultants:   Nephro  Procedures:   None  Antimicrobials:   None   Subjective: No complaints this morning. Patient states she feel ok without any sob.   Objective: Vitals:   09/16/16 0900 09/16/16 0944 09/16/16 1100 09/16/16 1141  BP: (!) 104/53 (!) 104/53    Pulse: 80 80  76  Resp:    18  Temp: 98 F (36.7 C)     TempSrc: Oral     SpO2: 95%  92% 95%  Weight:      Height:        Intake/Output Summary (Last 24 hours) at 09/16/16 1347 Last data filed at 09/16/16 0927  Gross per 24 hour  Intake             2615 ml  Output              600 ml  Net             2015 ml   Filed Weights   09/15/16 1148 09/15/16 2158 09/16/16 0500  Weight: (!) 149.2 kg (329 lb) Marland Kitchen)  149.5 kg (329 lb 9.4 oz) (!) 149.5 kg (329 lb 9.4 oz)    Examination:  General exam: Appears calm and comfortable ; trach in place  Respiratory system: Clear to auscultation. Respiratory effort normal. Cardiovascular system: S1 & S2 heard, RRR. No JVD, murmurs, rubs, gallops or clicks. No pedal edema. Gastrointestinal system: obese, Abdomen is nondistended, soft and nontender. No organomegaly or masses felt. Normal bowel sounds heard. Central nervous system: Alert and oriented. No focal neurological deficits. Extremities: Symmetric 5 x 5 power. Skin: No  rashes, lesions or ulcers Psychiatry: Judgement and insight appear normal. Mood & affect appropriate.     Data Reviewed:   CBC:  Recent Labs Lab 09/15/16 1154 09/16/16 0650  WBC 13.7* 10.9*  HGB 11.6* 9.9*  HCT 33.1* 29.8*  MCV 88.7 90.0  PLT 355 300   Basic Metabolic Panel:  Recent Labs Lab 09/15/16 0855 09/15/16 1154 09/15/16 2232 09/16/16 0650  NA 134* 134*  --  137  K 3.0* 3.2*  --  3.4*  CL 94* 97*  --  102  CO2 21 18*  --  20*  GLUCOSE 134* 127*  --  116*  BUN 112* 120*  --  121*  CREATININE 10.25* 10.76*  --  10.66*  CALCIUM 9.6 9.4  --  8.9  MG  --   --  2.2  --   PHOS  --   --  4.5  --    GFR: Estimated Creatinine Clearance: 10.7 mL/min (by C-G formula based on SCr of 10.66 mg/dL (H)). Liver Function Tests:  Recent Labs Lab 09/15/16 0855 09/15/16 1154 09/16/16 0650  AST _0 ALT _1 ALKPHOS 160* 151* 134*  BILITOT 1.4* 1.6* 1.1  PROT 8.5* 8.3* 7.4  ALBUMIN 3.2* 2.6* 2.0*   No results for input(s): LIPASE, AMYLASE in the last 168 hours. No results for input(s): AMMONIA in the last 168 hours. Coagulation Profile: No results for input(s): INR, PROTIME in the last 168 hours. Cardiac Enzymes: No results for input(s): CKTOTAL, CKMB, CKMBINDEX, TROPONINI in the last 168 hours. BNP (last 3 results) No results for input(s): PROBNP in the last 8760 hours. HbA1C:  Recent Labs  09/15/16 0855  HGBA1C 6.2   CBG: No results for input(s): GLUCAP in the last 168 hours. Lipid Profile:  Recent Labs  09/15/16 0855  CHOL 176  HDL 10.60*  TRIG 250.0*  CHOLHDL 17  LDLDIRECT 99.0   Thyroid Function Tests: No results for input(s): TSH, T4TOTAL, FREET4, T3FREE, THYROIDAB in the last 72 hours. Anemia Panel: No results for input(s): VITAMINB12, FOLATE, FERRITIN, TIBC, IRON, RETICCTPCT in the last 72 hours. Sepsis Labs: No results for input(s): PROCALCITON, LATICACIDVEN in the last 168 hours.  Recent Results (from the past 240 hour(s))    Urine culture     Status: Abnormal   Collection Time: 09/15/16  1:17 PM  Result Value Ref Range Status   Specimen Description URINE, CLEAN CATCH  Final   Special Requests NONE  Final   Culture MULTIPLE SPECIES PRESENT, SUGGEST RECOLLECTION (A)  Final   Report Status 09/16/2016 FINAL  Final  Respiratory Panel by PCR     Status: None   Collection Time: 09/15/16  5:58 PM  Result Value Ref Range Status   Adenovirus NOT DETECTED NOT DETECTED Final   Coronavirus 229E NOT DETECTED NOT DETECTED Final   Coronavirus HKU1 NOT DETECTED NOT DETECTED Final   Coronavirus NL63 NOT DETECTED NOT DETECTED Final   Coronavirus OC43  NOT DETECTED NOT DETECTED Final   Metapneumovirus NOT DETECTED NOT DETECTED Final   Rhinovirus / Enterovirus NOT DETECTED NOT DETECTED Final   Influenza A NOT DETECTED NOT DETECTED Final   Influenza B NOT DETECTED NOT DETECTED Final   Parainfluenza Virus 1 NOT DETECTED NOT DETECTED Final   Parainfluenza Virus 2 NOT DETECTED NOT DETECTED Final   Parainfluenza Virus 3 NOT DETECTED NOT DETECTED Final   Parainfluenza Virus 4 NOT DETECTED NOT DETECTED Final   Respiratory Syncytial Virus NOT DETECTED NOT DETECTED Final   Bordetella pertussis NOT DETECTED NOT DETECTED Final   Chlamydophila pneumoniae NOT DETECTED NOT DETECTED Final   Mycoplasma pneumoniae NOT DETECTED NOT DETECTED Final         Radiology Studies: Dg Chest 2 View  Result Date: 09/15/2016 CLINICAL DATA:  URI. EXAM: CHEST  2 VIEW COMPARISON:  06/28/2016 . FINDINGS: Tracheostomy tube noted in good anatomic position. Cardiomegaly with mild pulmonary vascular prominence and bilateral interstitial prominence consistent congestive heart failure. Interstitial pneumonitis cannot be excluded. No pleural effusion or pneumothorax. IMPRESSION: 1. Tracheostomy tube noted good anatomic position. 2. Cardiomegaly with mild pulmonary vascular prominence and bilateral interstitial prominence consistent with CHF. Interstitial  pneumonitis cannot be excluded . Electronically Signed   By: Marcello Moores  Register   On: 09/15/2016 13:57   US Renal  Result Date: 09/15/2016 CLINICAL DATA:  Acute kidney injury.  UTI.  Hypertension.  Diabetes. EXAM: RENAL / URINARY TRACT ULTRASOUND COMPLETE COMPARISON:  None. FINDINGS: Right Kidney: Length: 14.9 cm. Prominent cortical thickness. This could be secondary to diabetic glomerulonephropathy or acute nephritis. No hydronephrosis. No focal lesion. Left Kidney: Length: 13.3 cm. Prominent cortical thickness. This could be secondary to diabetic glomerulonephropathy or acute nephritis. No hydronephrosis. No focal lesion. Bladder: Appears normal for degree of bladder distention. IMPRESSION: Bilateral renal enlargement, more on the right than the left. Prominent cortical thickness. This could be secondary to diabetic glomerulonephropathy or acute nephritis. Electronically Signed   By: Nelson Chimes M.D.   On: 09/15/2016 16:40        Scheduled Meds: . cefTRIAXone (ROCEPHIN)  IV  1 g Intravenous Q24H  . heparin  5,000 Units Subcutaneous Q8H  . hydrALAZINE  100 mg Oral Q8H  . isosorbide dinitrate  30 mg Oral TID  . labetalol  100 mg Oral BID  . mouth rinse  15 mL Mouth Rinse BID  . multivitamin with minerals  1 tablet Oral Daily  . sodium chloride flush  3 mL Intravenous Q12H   Continuous Infusions: . sodium chloride 100 mL/hr at 09/16/16 0416     LOS: 1 day    Time spent: 35 mins     Rami Budhu Arsenio Loader, MD [facility] Pager [phone]   If 7PM-7AM, please contact night-coverage [email or web address] Password [email or web address] 09/16/2016, 1:47 PM

## 2016-09-16 NOTE — Progress Notes (Signed)
[facility] Rounding Note  Subjective/Interval History:  Says still feels fine Appetite is fine, no nausea No SOB IVF at 100 still BP's soft  Objective Vital signs in last 24 hours: Vitals:   09/16/16 0900 09/16/16 0944 09/16/16 1100 09/16/16 1141  BP: (!) 104/53 (!) 104/53    Pulse: 80 80  76  Resp:    18  Temp: 98 F (36.7 C)     TempSrc: Oral     SpO2: 95%  92% 95%  Weight:      Height:       Intake/Output Summary (Last 24 hours) at 09/16/16 1319 Last data filed at 09/16/16 7989  Gross per 24 hour  Intake             2615 ml  Output              600 ml  Net             2015 ml   Physical Exam:  Blood pressure (!) 104/53, pulse 76, temperature 98 F (36.7 C), temperature source Oral, resp. rate 18, height _0  (1.651 m), weight (!) 149.5 kg (329 lb 9.4 oz), SpO2 95 %, unknown if currently breastfeeding.  Very nice SMO AAF.  Uremic fetor to breath.  Looks well, and NAD Skin no rashes Capped trach Chest entirely clear to auscultation without crackles or wheezes Large neck, cannot see neck veins  Heart: S1S2 No S3 No murmur and no rub Abdomen: soft, non-distended. Obese. No focal tenderness. + BS Ext: No edema whatsoever Alert, Ox3, no focal deficit, no asterixus  Labs:  Recent Labs Lab 09/15/16 0855 09/15/16 1154 09/15/16 2232 09/16/16 0650  NA 134* 134*  --  137  K 3.0* 3.2*  --  3.4*  CL 94* 97*  --  102  CO2 21 18*  --  20*  GLUCOSE 134* 127*  --  116*  BUN 112* 120*  --  121*  CREATININE 10.25* 10.76*  --  10.66*  CALCIUM 9.6 9.4  --  8.9  PHOS  --   --  4.5  --     Recent Labs Lab 09/15/16 0855 09/15/16 1154 09/16/16 0650  AST _1 ALT _2 ALKPHOS 160* 151* 134*  BILITOT 1.4* 1.6* 1.1  PROT 8.5* 8.3* 7.4  ALBUMIN 3.2* 2.6* 2.0*    Recent Labs Lab 09/15/16 1154 09/16/16 0650  WBC 13.7* 10.9*  HGB 11.6* 9.9*  HCT 33.1* 29.8*  MCV 88.7 90.0  PLT 355 364   Studies/Results: Dg Chest 2 View  Result Date: 09/15/2016 CLINICAL  DATA:  URI. EXAM: CHEST  2 VIEW COMPARISON:  06/28/2016 . FINDINGS: Tracheostomy tube noted in good anatomic position. Cardiomegaly with mild pulmonary vascular prominence and bilateral interstitial prominence consistent congestive heart failure. Interstitial pneumonitis cannot be excluded. No pleural effusion or pneumothorax. IMPRESSION: 1. Tracheostomy tube noted good anatomic position. 2. Cardiomegaly with mild pulmonary vascular prominence and bilateral interstitial prominence consistent with CHF. Interstitial pneumonitis cannot be excluded . Electronically Signed   By: Marcello Moores  Register   On: 09/15/2016 13:57   US Renal  Result Date: 09/15/2016 CLINICAL DATA:  Acute kidney injury.  UTI.  Hypertension.  Diabetes. EXAM: RENAL / URINARY TRACT ULTRASOUND COMPLETE COMPARISON:  None. FINDINGS: Right Kidney: Length: 14.9 cm. Prominent cortical thickness. This could be secondary to diabetic glomerulonephropathy or acute nephritis. No hydronephrosis. No focal lesion. Left Kidney: Length: 13.3 cm. Prominent cortical thickness. This could be secondary to  diabetic glomerulonephropathy or acute nephritis. No hydronephrosis. No focal lesion. Bladder: Appears normal for degree of bladder distention. IMPRESSION: Bilateral renal enlargement, more on the right than the left. Prominent cortical thickness. This could be secondary to diabetic glomerulonephropathy or acute nephritis. Electronically Signed   By: Nelson Chimes M.D.   On: 09/15/2016 16:40   Medications: . sodium chloride 100 mL/hr at 09/16/16 0416   . cefTRIAXone (ROCEPHIN)  IV  1 g Intravenous Q24H  . heparin  5,000 Units Subcutaneous Q8H  . hydrALAZINE  100 mg Oral Q8H  . isosorbide dinitrate  30 mg Oral TID  . labetalol  300 mg Oral BID  . mouth rinse  15 mL Mouth Rinse BID  . multivitamin with minerals  1 tablet Oral Daily  . sodium chloride flush  3 mL Intravenous Q12H    Background: 36 y.o. year-old AAF with PMH sig for super morbid opbesity,  hypertension with h/o HT urgencies and poor control 2/2 non-compliance, OSA/OHV with chronic hypercapnic resp failure (s/p prolonged hospitalization 05/2016-06/2016 requiring trach 05/2016) and now with chronic trach, dCHF,  H/o gestational DM. Baseline creatinine has varied from as low as 0.86 to as high as 1.69 over past 4 months, and was 1.13 as recently as 07/12/16.  Went to primary care to get BP meds refilled (was out of her lasix, labetalol, isosorbide, Klor Con, had only a bottle of hydralazine), had labs drawn, creatinine returned at 10.25 - was sent to the ED - labs repeated - confirmed creatinine 10.76. UA looked infected, but also with new proteinuria (neg on UA 05/2016) and UPC of 3.29 gm.  UNa 30 with FeNa of 2.2% so not c/w pre-renal or Na avid state. No ACE, ARB, NSAID or PPI. Renal asked to evaluate  Assessment/Recommendations  1. Renal failure - new since November (creatinine 1/13 07/12/17; generally 0.9-1.69 past 4 mos). No ACE, ARB, NSAID or PPI. No documented hypotension though possible vol depletion 1. Has near nephrotic proteinuria by UPC (new since Oct), microscopic hematuria, large kidneys on ultrasound.  2. Other labs of note high total protein with high glob to alb ratio, and high normal corrected Ca.  3. Could have been somewhat volume depleted d/t recent diarrheal illness, but no documented hypotension PTA (checks BP at home) and no symptoms of same.  4. High FeNa speaks against a prerenal state.  5. Lack of uremic symptoms with current numbers suggests may be more chronic than acute 6. No objection to IVF but would be judicious given her severe OSA/OHV and dCHF history - have not helped so far - would reduce rate to 50/hour 7. Multiple tests pending - have cast out a rather wide net serologically 8. Picture suspicious for glomerulonephritis; also with elevated TP vs alb and large kidneys need to exclude infiltrative/dysproteinemic process  9. If no improvement with  additional fluids next 24 hours, recommend temp cath for HD over the weekend, then renal biopsy on Monday  2. Hypokalemia - Not expected with AKI (and has been out of her lasix and KlorCon for several days).  1. Gave 40 po once yest 2. K 3.4 today 3. Diarrhea - has resolved 4. Cough/URI - resp panel negative 5. Hypertension with H/O HT urgencies and severe HTN (no RAS by Duplex 2012)- normotensive despite being out of labetolol, isosorbide, furosemide for 3+ days (only had hydralazine in her bag) 1. Be careful starting back all home meds - was out of all but 1 PTA 2. BP soft today 3.  Reduce labetolol to 100 BID (from 300) 6. OSA/OHV - chronic trach 7. H/o dCHF 8. Metabolic acidosis 1. S/P amp of bicarb in the ED 9. Pyuria/bacteruria  1. Rocephin 2. UC mult spp  Jamal Maes, MD [facility] [phone] pager 09/16/2016, 1:19 PM

## 2016-09-17 DIAGNOSIS — R197 Diarrhea, unspecified: Secondary | ICD-10-CM

## 2016-09-17 LAB — RENAL FUNCTION PANEL
Albumin: 2.2 g/dL — ABNORMAL LOW (ref 3.5–5.0)
Anion gap: 15 (ref 5–15)
BUN: 124 mg/dL — ABNORMAL HIGH (ref 6–20)
CO2: 19 mmol/L — ABNORMAL LOW (ref 22–32)
Calcium: 8.7 mg/dL — ABNORMAL LOW (ref 8.9–10.3)
Chloride: 103 mmol/L (ref 101–111)
Creatinine, Ser: 10.74 mg/dL — ABNORMAL HIGH (ref 0.44–1.00)
GFR calc Af Amer: 5 mL/min — ABNORMAL LOW (ref >60)
GFR calc non Af Amer: 4 mL/min — ABNORMAL LOW (ref >60)
Glucose, Bld: 117 mg/dL — ABNORMAL HIGH (ref 65–99)
Phosphorus: 8.2 mg/dL — ABNORMAL HIGH (ref 2.5–4.6)
Potassium: 3.4 mmol/L — ABNORMAL LOW (ref 3.5–5.1)
Sodium: 137 mmol/L (ref 135–145)

## 2016-09-17 LAB — GLOMERULAR BASEMENT MEMBRANE ANTIBODIES: GBM Ab: 4 units (ref 0–20)

## 2016-09-17 LAB — GASTROINTESTINAL PANEL BY PCR, STOOL (REPLACES STOOL CULTURE)

## 2016-09-17 LAB — KAPPA/LAMBDA LIGHT CHAINS
Kappa free light chain: 195.2 mg/L — ABNORMAL HIGH (ref 3.3–19.4)
Kappa, lambda light chain ratio: 1.35 (ref 0.26–1.65)
Lambda free light chains: 145.1 mg/L — ABNORMAL HIGH (ref 5.7–26.3)

## 2016-09-17 LAB — BASIC METABOLIC PANEL
Anion gap: 15 (ref 5–15)
BUN: 123 mg/dL — ABNORMAL HIGH (ref 6–20)
CO2: 19 mmol/L — ABNORMAL LOW (ref 22–32)
Calcium: 8.8 mg/dL — ABNORMAL LOW (ref 8.9–10.3)
Chloride: 104 mmol/L (ref 101–111)
Creatinine, Ser: 10.71 mg/dL — ABNORMAL HIGH (ref 0.44–1.00)
GFR calc Af Amer: 5 mL/min — ABNORMAL LOW (ref >60)
GFR calc non Af Amer: 4 mL/min — ABNORMAL LOW (ref >60)
Glucose, Bld: 115 mg/dL — ABNORMAL HIGH (ref 65–99)
Potassium: 3.5 mmol/L (ref 3.5–5.1)
Sodium: 138 mmol/L (ref 135–145)

## 2016-09-17 LAB — MPO/PR-3 (ANCA) ANTIBODIES
ANCA Proteinase 3: 3.5 U/mL (ref 0.0–3.5)
Myeloperoxidase Abs: <9 U/mL (ref 0.0–9.0)

## 2016-09-17 LAB — ANTISTREPTOLYSIN O TITER: ASO: 175 IU/mL (ref 0.0–200.0)

## 2016-09-17 LAB — ANTI-DNA ANTIBODY, DOUBLE-STRANDED: ds DNA Ab: 9 IU/mL (ref 0–9)

## 2016-09-17 LAB — C4 COMPLEMENT: Complement C4, Body Fluid: 39 mg/dL (ref 14–44)

## 2016-09-17 LAB — C3 COMPLEMENT: C3 Complement: 145 mg/dL (ref 82–167)

## 2016-09-17 LAB — MAGNESIUM: Magnesium: 2.1 mg/dL (ref 1.7–2.4)

## 2016-09-17 LAB — ANTINUCLEAR ANTIBODIES, IFA: ANA Ab, IFA: NEGATIVE

## 2016-09-17 NOTE — Progress Notes (Signed)
[facility] Rounding Note  Subjective/Interval History:  Says still feels fine Appetite is fine, no nausea No SOB IVF at 36  Says she is making more urine despite no change in creatinine Politely declines catheter and HD today  Objective Vital signs in last 24 hours: Vitals:   09/17/16 0300 09/17/16 0603 09/17/16 0900 09/17/16 0918  BP:  117/60  134/72  Pulse: 85 86 80 82  Resp: _0 Temp:  98.3 F (36.8 C)    TempSrc:  Oral    SpO2: 98% 97% 96%   Weight:      Height:        Intake/Output Summary (Last 24 hours) at 09/17/16 0953 Last data filed at 09/17/16 0612  Gross per 24 hour  Intake          1723.33 ml  Output              700 ml  Net          1023.33 ml   Physical Exam:  Blood pressure 134/72, pulse 82, temperature 98.3 F (36.8 C), temperature source Oral, resp. rate 16, height 5' 5" (1.651 m), weight (!) 149.5 kg (329 lb 9.4 oz), SpO2 96 %, unknown if currently breastfeeding.  Very nice SMO AAF.  Uremic fetor to breath.  Still looks well, and NAD Skin no rashes Capped trach Chest entirely clear to auscultation without crackles or wheezes Large neck, cannot see neck veins  Heart: S1S2 No S3 No murmur and no rub Abdomen: soft, non-distended. Obese. No focal tenderness. + BS No edema whatsoever Alert, Ox3, no focal deficit, no asterixus  Labs:  Recent Labs Lab 09/15/16 0855 09/15/16 1154 09/15/16 2232 09/16/16 0650 09/17/16 0628  NA 134* 134*  --  137 137  138  K 3.0* 3.2*  --  3.4* 3.4*  3.5  CL 94* 97*  --  102 103  104  CO2 21 18*  --  20* 19*  19*  GLUCOSE 134* 127*  --  116* 117*  115*  BUN 112* 120*  --  121* 124*  123*  CREATININE 10.25* 10.76*  --  10.66* 10.74*  10.71*  CALCIUM 9.6 9.4  --  8.9 8.7*  8.8*  PHOS  --   --  4.5  --  8.2*    Recent Labs Lab 09/15/16 0855 09/15/16 1154 09/16/16 0650 09/17/16 0628  AST _1 --   ALT _2 --   ALKPHOS 160* 151* 134*  --   BILITOT 1.4* 1.6* 1.1  --   PROT 8.5* 8.3* 7.4   --   ALBUMIN 3.2* 2.6* 2.0* 2.2*    Recent Labs Lab 09/15/16 1154 09/16/16 0650  WBC 13.7* 10.9*  HGB 11.6* 9.9*  HCT 33.1* 29.8*  MCV 88.7 90.0  PLT 355 364   Studies/Results: Dg Chest 2 View  Result Date: 09/15/2016 CLINICAL DATA:  URI. EXAM: CHEST  2 VIEW COMPARISON:  06/28/2016 . FINDINGS: Tracheostomy tube noted in good anatomic position. Cardiomegaly with mild pulmonary vascular prominence and bilateral interstitial prominence consistent congestive heart failure. Interstitial pneumonitis cannot be excluded. No pleural effusion or pneumothorax. IMPRESSION: 1. Tracheostomy tube noted good anatomic position. 2. Cardiomegaly with mild pulmonary vascular prominence and bilateral interstitial prominence consistent with CHF. Interstitial pneumonitis cannot be excluded . Electronically Signed   By: Marcello Moores  Register   On: 09/15/2016 13:57   US Renal  Result Date: 09/15/2016 CLINICAL DATA:  Acute kidney injury.  UTI.  Hypertension.  Diabetes. EXAM: RENAL / URINARY TRACT ULTRASOUND COMPLETE COMPARISON:  None. FINDINGS: Right Kidney: Length: 14.9 cm. Prominent cortical thickness. This could be secondary to diabetic glomerulonephropathy or acute nephritis. No hydronephrosis. No focal lesion. Left Kidney: Length: 13.3 cm. Prominent cortical thickness. This could be secondary to diabetic glomerulonephropathy or acute nephritis. No hydronephrosis. No focal lesion. Bladder: Appears normal for degree of bladder distention. IMPRESSION: Bilateral renal enlargement, more on the right than the left. Prominent cortical thickness. This could be secondary to diabetic glomerulonephropathy or acute nephritis. Electronically Signed   By: Nelson Chimes M.D.   On: 09/15/2016 16:40   Medications: . sodium chloride 50 mL/hr at 09/16/16 1610   . cefTRIAXone (ROCEPHIN)  IV  1 g Intravenous Q24H  . heparin  5,000 Units Subcutaneous Q8H  . hydrALAZINE  100 mg Oral Q8H  . isosorbide dinitrate  30 mg Oral TID  .  labetalol  100 mg Oral BID  . mouth rinse  15 mL Mouth Rinse BID  . multivitamin with minerals  1 tablet Oral Daily  . sodium chloride flush  3 mL Intravenous Q12H    Background: 36 y.o. year-old AAF with PMH sig for super morbid opbesity, hypertension with h/o HT urgencies and poor control 2/2 non-compliance, OSA/OHV with chronic hypercapnic resp failure (s/p prolonged hospitalization 05/2016-06/2016 requiring trach 05/2016) and now with chronic trach, dCHF,  H/o gestational DM. Baseline creatinine has varied from as low as 0.86 to as high as 1.69 over past 4 months, and was 1.13 as recently as 07/12/16.  Went to primary care to get BP meds refilled (was out of her lasix, labetalol, isosorbide, Klor Con, had only a bottle of hydralazine), had labs drawn, creatinine returned at 10.25 - was sent to the ED - labs repeated - confirmed creatinine 10.76. UA looked infected, but also with new proteinuria (neg on UA 05/2016) and UPC of 3.29 gm.  UNa 30 with FeNa of 2.2% so not c/w pre-renal or Na avid state. No ACE, ARB, NSAID or PPI. Renal asked to evaluate  Assessment/Recommendations  1. Renal failure - new since November (creatinine 1/13 07/12/17; generally 0.9-1.69 past 4 mos). No ACE, ARB, NSAID or PPI. No documented hypotension though possible vol depletion 1. Has near nephrotic proteinuria (3.29 gm) by UPC (new since Oct), microscopic hematuria, large kidneys on ultrasound.  2. Other labs of note high total protein with high glob to alb ratio, and high normal corrected Ca.  3. Could have been somewhat volume depleted d/t recent diarrheal illness, but no documented hypotension PTA (checks BP at home) and no symptoms of same.  4. High FeNa speaks against a prerenal state.  5. Lack of uremic symptoms with current numbers suggests may be more subacute than acute 6. Getting IVF at 36  7. Multiple tests pending - so far: ASO high normal, antiGBM negative, C4 normal, SPEP c/w acute inflammatory response  but no monoclonal  8. Picture suspicious for glomerulonephritis; also with elevated TP vs alb and large kidneys need to exclude infiltrative/dysproteinemic process  9. D/t lack of improvement I recommended temp cath for HD over the weekend, then renal biopsy on Monday - she has politely declined catheter and dialysis today but will "reconsider" tomorrow  2. Hypokalemia - resolved 3. Diarrhea - has resolved 4. Cough/URI - resp panel negative 5. Hypertension with H/O HT urgencies and severe HTN (no RAS by Duplex 2012)- normotensive despite being out of labetolol, isosorbide, furosemide for 3+ days (only had  hydralazine in her bag) 1. BP was soft yesterday 2. Reduced labetolol to 100 BID (from 300) 3. Same hydralazine and isosorbide 6. OSA/OHV - chronic trach 7. H/o dCHF 8. Metabolic acidosis 1. S/P amp of bicarb in the ED 2. Add po bicarb today 9. Pyuria/bacteruria  1. Rocephin 2. UC mult spp  Jamal Maes, MD [facility] [phone] pager 09/17/2016, 9:53 AM

## 2016-09-17 NOTE — Progress Notes (Signed)
PROGRESS NOTE    [name] [name]  GQQ:[id number] DOB: 10-05-1980 DOA: 09/15/2016 PCP: Wilfred Lacy, NP   Brief Narrative:  [name] [name] is a 36 y.o. female who presents with acute on chronic renal insufficiency possibly secondary to dehydration from poor oral intake and diarrhea and medications in the setting of a UTI   Assessment & Plan:   Principal Problem:   Acute renal failure superimposed on stage 3 chronic kidney disease ([place]) Active Problems:   Malignant hypertension   Morbid obesity (HCC)   OSA (obstructive sleep apnea)   Pulmonary hypertension   Chronic respiratory failure with hypercapnia (HCC)   Tracheostomy status (HCC)   Chronic diastolic heart failure (HCC)   Obesity hypoventilation syndrome (HCC)   Pneumonitis   Diarrhea   Abnormal urinalysis   Acute renal failure (HCC)   Lower urinary tract infectious disease  Acute renal failure superimposed on stage 3 chronic kidney disease, not on chronic dialysis  -Patient presents with acute kidney injury in setting of known stage III chronic kidney disease (06/2016- Cr was 1.1-1.6) -hold diuretics, gentle hydration -Possible kidney biopsy and HD - defer to nephro but thus far patient has declined -Follow daily labs at this time -Renal ultrasound: Bilateral renal enlargement, more on the right than the left.  Prominent cortical thickness. This could be secondary to diabetic glomerulonephropathy or acute nephritis.   Malignant hypertension - now in normal range  -Recent issues with poor control in the outpatient setting -Current blood pressures controlled  -Continue preadmission hydralazine, Isordil and labetalol    Diarrhea -GI panel in progress    Pulmonary hypertension/Chronic diastolic heart failure  -Appears compensated -Diuretics on hold -Continue nitrate    Pneumonitis -unknown etiology. Resp Panel neg -supportive care    Abnormal urinalysis -rocephin - urine culture- multiple species   Chronic respiratory failure with hypercapnia/tracheostomy status 2/2   Morbid obesity/OSA (obstructive sleep apnea) with Obesity hypoventilation syndrome  -Followed as an outpatient at the tracheostomy clinic -Continue CPAP (??via mask vs thru trach) -Outpatient appointment scheduled with Dr. Eartha Inch on 1/29 to discuss tracheostomy revision/possible decannulation -Currently not hypoxic    DVT prophylaxis: Subcutaneous heparin Code Status: Full Family Communication: no family at bedside Disposition Plan: needs HD/renal biopsy Consults called: Dr Lorrene Reid from Nephro following.   Consultants:   Nephro  Procedures:   None  Antimicrobials:   None   Subjective: Wants to be able to make appointment with Dr. Elsworth Soho on Monday  Objective: Vitals:   09/17/16 0900 09/17/16 0918 09/17/16 1218 09/17/16 1312  BP:  134/72  124/72  Pulse: 80 82 78   Resp: 16  18   Temp:      TempSrc:      SpO2: 96%  95%   Weight:      Height:        Intake/Output Summary (Last 24 hours) at 09/17/16 1359 Last data filed at 09/17/16 1033  Gross per 24 hour  Intake          2083.33 ml  Output              700 ml  Net          1383.33 ml   Filed Weights   09/15/16 1148 09/15/16 2158 09/16/16 0500  Weight: (!) 149.2 kg (329 lb) (!) 149.5 kg (329 lb 9.4 oz) (!) 149.5 kg (329 lb 9.4 oz)    Examination:  General exam: Appears calm and comfortable ; trach in place - walking in room Respiratory system:  Clear to auscultation. Respiratory effort normal. Cardiovascular system: S1 & S2 heard, RRR. No JVD, murmurs, rubs, gallops or clicks. No pedal edema. Gastrointestinal system: obese, Abdomen is nondistended, soft and nontender. No organomegaly or masses felt. Normal bowel sounds heard. Central nervous system: Alert and oriented. No focal neurological deficits.     Data Reviewed:   CBC:  Recent Labs Lab 09/15/16 1154 09/16/16 0650  WBC 13.7* 10.9*  HGB 11.6* 9.9*  HCT 33.1* 29.8*  MCV 88.7  90.0  PLT 355 732   Basic Metabolic Panel:  Recent Labs Lab 09/15/16 0855 09/15/16 1154 09/15/16 2232 09/16/16 0650 09/17/16 0628  NA 134* 134*  --  137 137  138  K 3.0* 3.2*  --  3.4* 3.4*  3.5  CL 94* 97*  --  102 103  104  CO2 21 18*  --  20* 19*  19*  GLUCOSE 134* 127*  --  116* 117*  115*  BUN 112* 120*  --  121* 124*  123*  CREATININE 10.25* 10.76*  --  10.66* 10.74*  10.71*  CALCIUM 9.6 9.4  --  8.9 8.7*  8.8*  MG  --   --  2.2  --  2.1  PHOS  --   --  4.5  --  8.2*   GFR: Estimated Creatinine Clearance: 10.6 mL/min (by C-G formula based on SCr of 10.74 mg/dL (H)). Liver Function Tests:  Recent Labs Lab 09/15/16 0855 09/15/16 1154 09/16/16 0650 09/17/16 0628  AST _0 --   ALT _1 --   ALKPHOS 160* 151* 134*  --   BILITOT 1.4* 1.6* 1.1  --   PROT 8.5* 8.3* 7.4  --   ALBUMIN 3.2* 2.6* 2.0* 2.2*   No results for input(s): LIPASE, AMYLASE in the last 168 hours. No results for input(s): AMMONIA in the last 168 hours. Coagulation Profile: No results for input(s): INR, PROTIME in the last 168 hours. Cardiac Enzymes: No results for input(s): CKTOTAL, CKMB, CKMBINDEX, TROPONINI in the last 168 hours. BNP (last 3 results) No results for input(s): PROBNP in the last 8760 hours. HbA1C:  Recent Labs  09/15/16 0855  HGBA1C 6.2   CBG: No results for input(s): GLUCAP in the last 168 hours. Lipid Profile:  Recent Labs  09/15/16 0855  CHOL 176  HDL 10.60*  TRIG 250.0*  CHOLHDL 17  LDLDIRECT 99.0   Thyroid Function Tests: No results for input(s): TSH, T4TOTAL, FREET4, T3FREE, THYROIDAB in the last 72 hours. Anemia Panel: No results for input(s): VITAMINB12, FOLATE, FERRITIN, TIBC, IRON, RETICCTPCT in the last 72 hours. Sepsis Labs: No results for input(s): PROCALCITON, LATICACIDVEN in the last 168 hours.  Recent Results (from the past 240 hour(s))  Urine culture     Status: Abnormal   Collection Time: 09/15/16  1:17 PM  Result  Value Ref Range Status   Specimen Description URINE, CLEAN CATCH  Final   Special Requests NONE  Final   Culture MULTIPLE SPECIES PRESENT, SUGGEST RECOLLECTION (A)  Final   Report Status 09/16/2016 FINAL  Final  Culture, blood (Routine X 2) w Reflex to ID Panel     Status: None (Preliminary result)   Collection Time: 09/15/16  5:10 PM  Result Value Ref Range Status   Specimen Description BLOOD LEFT ANTECUBITAL  Final   Special Requests BOTTLES DRAWN AEROBIC AND ANAEROBIC 5CC  Final   Culture NO GROWTH 2 DAYS  Final   Report Status PENDING  Incomplete  Respiratory  Panel by PCR     Status: None   Collection Time: 09/15/16  5:58 PM  Result Value Ref Range Status   Adenovirus NOT DETECTED NOT DETECTED Final   Coronavirus 229E NOT DETECTED NOT DETECTED Final   Coronavirus HKU1 NOT DETECTED NOT DETECTED Final   Coronavirus NL63 NOT DETECTED NOT DETECTED Final   Coronavirus OC43 NOT DETECTED NOT DETECTED Final   Metapneumovirus NOT DETECTED NOT DETECTED Final   Rhinovirus / Enterovirus NOT DETECTED NOT DETECTED Final   Influenza A NOT DETECTED NOT DETECTED Final   Influenza B NOT DETECTED NOT DETECTED Final   Parainfluenza Virus 1 NOT DETECTED NOT DETECTED Final   Parainfluenza Virus 2 NOT DETECTED NOT DETECTED Final   Parainfluenza Virus 3 NOT DETECTED NOT DETECTED Final   Parainfluenza Virus 4 NOT DETECTED NOT DETECTED Final   Respiratory Syncytial Virus NOT DETECTED NOT DETECTED Final   Bordetella pertussis NOT DETECTED NOT DETECTED Final   Chlamydophila pneumoniae NOT DETECTED NOT DETECTED Final   Mycoplasma pneumoniae NOT DETECTED NOT DETECTED Final  Culture, blood (Routine X 2) w Reflex to ID Panel     Status: None (Preliminary result)   Collection Time: 09/15/16  8:29 PM  Result Value Ref Range Status   Specimen Description BLOOD LEFT HAND  Final   Special Requests IN PEDIATRIC BOTTLE 1CC  Final   Culture NO GROWTH 2 DAYS  Final   Report Status PENDING  Incomplete          Radiology Studies: US Renal  Result Date: 09/15/2016 CLINICAL DATA:  Acute kidney injury.  UTI.  Hypertension.  Diabetes. EXAM: RENAL / URINARY TRACT ULTRASOUND COMPLETE COMPARISON:  None. FINDINGS: Right Kidney: Length: 14.9 cm. Prominent cortical thickness. This could be secondary to diabetic glomerulonephropathy or acute nephritis. No hydronephrosis. No focal lesion. Left Kidney: Length: 13.3 cm. Prominent cortical thickness. This could be secondary to diabetic glomerulonephropathy or acute nephritis. No hydronephrosis. No focal lesion. Bladder: Appears normal for degree of bladder distention. IMPRESSION: Bilateral renal enlargement, more on the right than the left. Prominent cortical thickness. This could be secondary to diabetic glomerulonephropathy or acute nephritis. Electronically Signed   By: Nelson Chimes M.D.   On: 09/15/2016 16:40        Scheduled Meds: . cefTRIAXone (ROCEPHIN)  IV  1 g Intravenous Q24H  . heparin  5,000 Units Subcutaneous Q8H  . hydrALAZINE  100 mg Oral Q8H  . isosorbide dinitrate  30 mg Oral TID  . labetalol  100 mg Oral BID  . mouth rinse  15 mL Mouth Rinse BID  . multivitamin with minerals  1 tablet Oral Daily  . sodium chloride flush  3 mL Intravenous Q12H   Continuous Infusions: . sodium chloride 50 mL/hr at 09/16/16 1610     LOS: 2 days    Time spent: 25 mins     [place], DO [facility] Pager [phone]   If 7PM-7AM, please contact night-coverage [email or web address] Password [email or web address] 09/17/2016, 1:59 PM

## 2016-09-18 LAB — CBC
HCT: 29.7 % — ABNORMAL LOW (ref 36.0–46.0)
Hemoglobin: 9.8 g/dL — ABNORMAL LOW (ref 12.0–15.0)
MCH: 30.2 pg (ref 26.0–34.0)
MCHC: 33 g/dL (ref 30.0–36.0)
MCV: 91.4 fL (ref 78.0–100.0)
Platelets: 469 10*3/uL — ABNORMAL HIGH (ref 150–400)
RBC: 3.25 MIL/uL — ABNORMAL LOW (ref 3.87–5.11)
RDW: 15.7 % — ABNORMAL HIGH (ref 11.5–15.5)
WBC: 9.1 10*3/uL (ref 4.0–10.5)

## 2016-09-18 LAB — BASIC METABOLIC PANEL
Anion gap: 17 — ABNORMAL HIGH (ref 5–15)
BUN: 119 mg/dL — ABNORMAL HIGH (ref 6–20)
CO2: 17 mmol/L — ABNORMAL LOW (ref 22–32)
Calcium: 8.6 mg/dL — ABNORMAL LOW (ref 8.9–10.3)
Chloride: 104 mmol/L (ref 101–111)
Creatinine, Ser: 10.4 mg/dL — ABNORMAL HIGH (ref 0.44–1.00)
GFR calc Af Amer: 5 mL/min — ABNORMAL LOW (ref >60)
GFR calc non Af Amer: 4 mL/min — ABNORMAL LOW (ref >60)
Glucose, Bld: 114 mg/dL — ABNORMAL HIGH (ref 65–99)
Potassium: 3.7 mmol/L (ref 3.5–5.1)
Sodium: 138 mmol/L (ref 135–145)

## 2016-09-18 LAB — MAGNESIUM: Magnesium: 2.2 mg/dL (ref 1.7–2.4)

## 2016-09-18 LAB — PROTIME-INR
INR: 1.27
Prothrombin Time: 16 seconds — ABNORMAL HIGH (ref 11.4–15.2)

## 2016-09-18 LAB — RENAL FUNCTION PANEL
Albumin: 2.1 g/dL — ABNORMAL LOW (ref 3.5–5.0)
Anion gap: 17 — ABNORMAL HIGH (ref 5–15)
BUN: 120 mg/dL — ABNORMAL HIGH (ref 6–20)
CO2: 17 mmol/L — ABNORMAL LOW (ref 22–32)
Calcium: 8.7 mg/dL — ABNORMAL LOW (ref 8.9–10.3)
Chloride: 104 mmol/L (ref 101–111)
Creatinine, Ser: 10.29 mg/dL — ABNORMAL HIGH (ref 0.44–1.00)
GFR calc Af Amer: 5 mL/min — ABNORMAL LOW (ref >60)
GFR calc non Af Amer: 4 mL/min — ABNORMAL LOW (ref >60)
Glucose, Bld: 115 mg/dL — ABNORMAL HIGH (ref 65–99)
Phosphorus: 8.7 mg/dL — ABNORMAL HIGH (ref 2.5–4.6)
Potassium: 3.6 mmol/L (ref 3.5–5.1)
Sodium: 138 mmol/L (ref 135–145)

## 2016-09-18 MED ORDER — SODIUM BICARBONATE 650 MG PO TABS
1300.0000 mg | ORAL_TABLET | Freq: Three times a day (TID) | ORAL | Status: DC
  Administered 2016-09-18 – 2016-09-22 (×15): 1300 mg via ORAL
  Filled 2016-09-18 (×15): qty 2

## 2016-09-18 NOTE — Progress Notes (Signed)
[facility] Rounding Note  Subjective/Interval History:  Says still feels fine Appetite has remained good, no nausea No SOB IVF at 48 Some issues getting UOP recorded States making "a lot more urine" than I was  Objective Vital signs in last 24 hours: Vitals:   09/18/16 0340 09/18/16 0512 09/18/16 0635 09/18/16 0816  BP:  (!) 129/59    Pulse: 91 91  90  Resp: _0 Temp:  98.1 F (36.7 C)    TempSrc:  Oral    SpO2: 96% 94%  95%  Weight:   (!) 161.4 kg (355 lb 13.2 oz)   Height:        Intake/Output Summary (Last 24 hours) at 09/18/16 1025 Last data filed at 09/18/16 0856  Gross per 24 hour  Intake          2355.83 ml  Output              700 ml  Net          1655.83 ml   Physical Exam:  Blood pressure (!) 129/59, pulse 90, temperature 98.1 F (36.7 C), temperature source Oral, resp. rate 18, height 5' 5" (1.651 m), weight (!) 161.4 kg (355 lb 13.2 oz), SpO2 95 %, unknown if currently breastfeeding.  Weights unreliable 15 kg discrepancy I/O unreliable - failure to record  Very nice SMO AAF.  Fetor to breath.  Still looks well, and NAD On trach collar Chest remains entirely clear to auscultation without crackles or wheezes Large neck, cannot see neck veins  S1S2 No S3 No murmur and no rub Abdomen: soft, non-distended. Obese. No focal tenderness. + BS No edema of LE's Alert, Ox3, no focal deficit, no asterixus  Labs:  Recent Labs Lab 09/15/16 0855 09/15/16 1154 09/15/16 2232 09/16/16 0650 09/17/16 0628 09/18/16 0551  NA 134* 134*  --  137 137  138 138  138  K 3.0* 3.2*  --  3.4* 3.4*  3.5 3.6  3.7  CL 94* 97*  --  102 103  104 104  104  CO2 21 18*  --  20* 19*  19* 17*  17*  GLUCOSE 134* 127*  --  116* 117*  115* 115*  114*  BUN 112* 120*  --  121* 124*  123* 120*  119*  CREATININE 10.25* 10.76*  --  10.66* 10.74*  10.71* 10.29*  10.40*  CALCIUM 9.6 9.4  --  8.9 8.7*  8.8* 8.7*  8.6*  PHOS  --   --  4.5  --  8.2* 8.7*    Recent Labs Lab  09/15/16 0855 09/15/16 1154 09/16/16 0650 09/17/16 0628 09/18/16 0551  AST _1 --   --   ALT _2 --   --   ALKPHOS 160* 151* 134*  --   --   BILITOT 1.4* 1.6* 1.1  --   --   PROT 8.5* 8.3* 7.4  --   --   ALBUMIN 3.2* 2.6* 2.0* 2.2* 2.1*    Recent Labs Lab 09/15/16 1154 09/16/16 0650 09/18/16 0551  WBC 13.7* 10.9* 9.1  HGB 11.6* 9.9* 9.8*  HCT 33.1* 29.8* 29.7*  MCV 88.7 90.0 91.4  PLT 355 364 469*   Results for [name] [name] (MRN [id number]) as of 09/18/2016 10:29  Ref. Range 09/15/2016 20:32  ANA Ab, IFA Unknown Negative  ANCA Proteinase 3 Latest Ref Range: 0.0 - 3.5 U/mL 3.5  ASO Latest Ref Range: 0.0 - 200.0 IU/mL 175.0  ds DNA Ab Latest Ref Range: 0 - 9 IU/mL 9  GBM Ab Latest Ref Range: 0 - 20 units 4  Myeloperoxidase Abs Latest Ref Range: 0.0 - 9.0 U/mL <9.0   Results for [name] [name] (MRN [id number]) as of 09/18/2016 10:29  Ref. Range 09/15/2016 20:32  C3 Complement Latest Ref Range: 82 - 167 mg/dL 145  Complement C4, Body Fluid Latest Ref Range: 14 - 44 mg/dL 39   Results for ELLISHA, [name] (MRN [id number]) as of 09/18/2016 10:29  Ref. Range 09/15/2016 20:32  Kappa free light chain Latest Ref Range: 3.3 - 19.4 mg/L 195.2 (H)  Lamda free light chains Latest Ref Range: 5.7 - 26.3 mg/L 145.1 (H)  Kappa, lamda light chain ratio Latest Ref Range: 0.26 - 1.65  1.35    Studies/Results: Dg Chest 2 View  Result Date: 09/15/2016 CLINICAL DATA:  URI. EXAM: CHEST  2 VIEW COMPARISON:  06/28/2016 . FINDINGS: Tracheostomy tube noted in good anatomic position. Cardiomegaly with mild pulmonary vascular prominence and bilateral interstitial prominence consistent congestive heart failure. Interstitial pneumonitis cannot be excluded. No pleural effusion or pneumothorax. IMPRESSION: 1. Tracheostomy tube noted good anatomic position. 2. Cardiomegaly with mild pulmonary vascular prominence and bilateral interstitial prominence consistent with CHF. Interstitial pneumonitis  cannot be excluded . Electronically Signed   By: Marcello Moores  Register   On: 09/15/2016 13:57   US Renal  Result Date: 09/15/2016 CLINICAL DATA:  Acute kidney injury.  UTI.  Hypertension.  Diabetes. EXAM: RENAL / URINARY TRACT ULTRASOUND COMPLETE COMPARISON:  None. FINDINGS: Right Kidney: Length: 14.9 cm. Prominent cortical thickness. This could be secondary to diabetic glomerulonephropathy or acute nephritis. No hydronephrosis. No focal lesion. Left Kidney: Length: 13.3 cm. Prominent cortical thickness. This could be secondary to diabetic glomerulonephropathy or acute nephritis. No hydronephrosis. No focal lesion. Bladder: Appears normal for degree of bladder distention. IMPRESSION: Bilateral renal enlargement, more on the right than the left. Prominent cortical thickness. This could be secondary to diabetic glomerulonephropathy or acute nephritis. Electronically Signed   By: Nelson Chimes M.D.   On: 09/15/2016 16:40   Medications: . sodium chloride 50 mL/hr at 09/18/16 0856   . cefTRIAXone (ROCEPHIN)  IV  1 g Intravenous Q24H  . heparin  5,000 Units Subcutaneous Q8H  . hydrALAZINE  100 mg Oral Q8H  . isosorbide dinitrate  30 mg Oral TID  . labetalol  100 mg Oral BID  . mouth rinse  15 mL Mouth Rinse BID  . multivitamin with minerals  1 tablet Oral Daily  . sodium chloride flush  3 mL Intravenous Q12H    Background: 36 y.o. year-old AAF with PMH sig for super morbid opbesity, hypertension with h/o HT urgencies and poor control 2/2 non-compliance, OSA/OHV with chronic hypercapnic resp failure (s/p prolonged hospitalization 05/2016-06/2016 requiring trach 05/2016) and now with chronic trach, dCHF,  H/o gestational DM. Baseline creatinine has varied from as low as 0.86 to as high as 1.69 over past 4 months, and was 1.13 as recently as 07/12/16.  Went to primary care to get BP meds refilled (was out of her lasix, labetalol, isosorbide, Klor Con, had only a bottle of hydralazine), had labs drawn,  creatinine returned at 10.25 - was sent to the ED - labs repeated - confirmed creatinine 10.76. UA looked infected, but also with new proteinuria (neg on UA 05/2016) and UPC of 3.29 gm.  UNa 30 with FeNa of 2.2% so not c/w pre-renal or Na avid state. No ACE, ARB, NSAID or PPI. Renal asked  to evaluate  Assessment/Recommendations  1. Renal failure - acute vs subacute on CKD3. New since November (creatinine 1/13 07/12/17; generally 0.9-1.69 past 4 mos). No ACE, ARB, NSAID or PPI. No documented hypotension though possible vol depletion 1. Could have been somewhat volume depleted d/t recent diarrheal illness, but no documented hypotension PTA (checks BP at home) and no symptoms of same.  2. Lack of uremic symptoms with current numbers suggests process may be more subacute than acute 3. High FeNa speaks against a prerenal state.  4. Has near nephrotic proteinuria (3.29 gm) by UPC (new since Oct), microscopic hematuria, large kidneys on ultrasound. Picture most c/w glomerulonephritis   5. W/U - so far: ASO high normal, antiGBM negative, C3, C4 normal, SPEP c/w acute inflammatory response but no monoclonal; ANCA proteinase 3 upper limits normal, dsDNA upper limits normal; kappa and lambda LC's elevated with normal ratio; urine immunofix pending 6. She is agreeable to "planning" for a renal biopsy on Monday if possible, but not to HD at this time.  2. Hypokalemia - stable post repletion 3. Anemia - progressive since adm. Now <10. Check Fe studies. Replete if low. Then consider Aranesp 4. Diarrhea - resolved 5. Cough/URI - resp panel negative 6. Hypertension with H/O HT urgencies and severe HTN (no RAS by Duplex 2012)- normotensive on admission despite being out of labetolol, isosorbide, furosemide for 3+ days (only had hydralazine in her bag) 1. BP normal at this time (Reduced labetolol to 100 BID (from 300) and continued same hydralazine and isosorbide) 7. OSA/OHV - chronic trach 8. H/o dCHF - no CHF sx  at this time 9. Metabolic acidosis - increase po bicarb to 1300 TID 10. Pyuria/bacteruria.  Rocephin since 1/24. UC mult spp. Could stop.  Jamal Maes, MD [facility] [phone] pager 09/18/2016, 10:25 AM

## 2016-09-18 NOTE — Consult Note (Signed)
Chief Complaint: Patient was seen in consultation today for acute renal failure  Referring Physician(s):  Dr. Jamal Maes  Supervising Physician: Markus Daft  Patient Status: [facility] - In-pt  History of Present Illness: [name] [name] is a 36 y.o. female with past medical history significant for DM, HTN, morbid obesity, chronic hypercapnic respiratory failure s/p tracheostomy since 05/2016, and chronic kidney disease presented to [facility] ED with abnormal BUN and SCr.   IR consulted for random renal biopsy at the request of Dr. Solmon Ice.   Patient has trach in place.   Past Medical History:  Diagnosis Date  . Diabetes mellitus without complication (HCC)    GDM  . Gestational diabetes mellitus in pregnancy 05/24/2013  . HTN in pregnancy, chronic 05/24/2013  . Hypertension   . Morbid obesity ([place])   . Sinus tachycardia     Past Surgical History:  Procedure Laterality Date  . TRACHEOSTOMY TUBE PLACEMENT N/A 06/11/2016   Procedure: TRACHEOSTOMY;  Surgeon: Melida Quitter, MD;  Location: [facility];  Service: ENT;  Laterality: N/A;    Allergies: No known allergies  Medications: Prior to Admission medications   Medication Sig Start Date End Date Taking? Authorizing Provider  furosemide (LASIX) 80 MG tablet Take 1 tablet (80 mg total) by mouth 3 (three) times daily. 09/15/16  Yes Charlene Brooke Nche, NP  hydrALAZINE (APRESOLINE) 100 MG tablet Take 1 tablet (100 mg total) by mouth 3 (three) times daily. 09/15/16  Yes Charlene Brooke Nche, NP  isosorbide dinitrate (ISORDIL) 30 MG tablet Take 1 tablet (30 mg total) by mouth 3 (three) times daily. 09/15/16  Yes Charlene Brooke Nche, NP  labetalol (NORMODYNE) 300 MG tablet Take 1 tablet (300 mg total) by mouth 2 (two) times daily. 09/15/16  Yes Flossie Buffy, NP  Multiple Vitamin (MULTIVITAMIN WITH MINERALS) TABS tablet Take 1 tablet by mouth daily. 09/15/16  Yes Charlene Brooke Nche, NP  potassium chloride SA (K-DUR,KLOR-CON) 20 MEQ tablet Take 1  tablet (20 mEq total) by mouth 2 (two) times daily. 09/15/16  Yes Charlene Brooke Nche, NP  famotidine (PEPCID) 20 MG tablet Take 1 tablet (20 mg total) by mouth 2 (two) times daily. Patient not taking: Reported on 09/15/2016 09/15/16   Flossie Buffy, NP     Family History  Problem Relation Age of Onset  . Hypertension Mother   . Diabetes Other   . Birth defects Paternal Grandfather     unknown type    Social History   Social History  . Marital status: Single    Spouse name: N/A  . Number of children: N/A  . Years of education: N/A   Occupational History  . 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 Smoking status: Never Smoker  . Smokeless tobacco: Never Used  . Alcohol use No  . Drug use: No  . Sexual activity: Not Currently    Birth control/ protection: IUD   Other Topics Concern  . None   Social History Narrative   LIVES ALONE   WORKS AT [employer] TE[employer]   DENIES ANY TOBACCO OR ETOH USE          Review of Systems  Constitutional: Negative for fatigue and fever.  Respiratory: Negative for cough and shortness of breath.   Cardiovascular: Negative for chest pain.  Psychiatric/Behavioral: Negative for behavioral problems and confusion.    Vital Signs: BP (!) 129/59 (BP Location: Right Wrist)   Pulse 90   Temp 98.1  F (36.7 C) (Oral)   Resp 18   Ht 5' 5" (1.651 m)   Wt (!) 355 lb 13.2 oz (161.4 kg)   SpO2 95%   BMI 59.21 kg/m   Physical Exam  Constitutional: She is oriented to person, place, and time. She appears well-developed.  Cardiovascular: Normal rate, regular rhythm and normal heart sounds.   Pulmonary/Chest: Effort normal and breath sounds normal. No respiratory distress.  Neurological: She is alert and oriented to person, place, and time.  Skin: Skin is warm and dry.  Psychiatric: She has a normal mood and affect. Her behavior is normal. Judgment and thought content normal.  Nursing note and vitals  reviewed.   Mallampati Score:  MD Evaluation Airway: Other (comments) Airway comments: trach Heart: WNL Abdomen: WNL Chest/ Lungs: WNL ASA  Classification: 3 Mallampati/Airway Score: Three  Imaging: Dg Chest 2 View  Result Date: 09/15/2016 CLINICAL DATA:  URI. EXAM: CHEST  2 VIEW COMPARISON:  06/28/2016 . FINDINGS: Tracheostomy tube noted in good anatomic position. Cardiomegaly with mild pulmonary vascular prominence and bilateral interstitial prominence consistent congestive heart failure. Interstitial pneumonitis cannot be excluded. No pleural effusion or pneumothorax. IMPRESSION: 1. Tracheostomy tube noted good anatomic position. 2. Cardiomegaly with mild pulmonary vascular prominence and bilateral interstitial prominence consistent with CHF. Interstitial pneumonitis cannot be excluded . Electronically Signed   By: Marcello Moores  Register   On: 09/15/2016 13:57   US Renal  Result Date: 09/15/2016 CLINICAL DATA:  Acute kidney injury.  UTI.  Hypertension.  Diabetes. EXAM: RENAL / URINARY TRACT ULTRASOUND COMPLETE COMPARISON:  None. FINDINGS: Right Kidney: Length: 14.9 cm. Prominent cortical thickness. This could be secondary to diabetic glomerulonephropathy or acute nephritis. No hydronephrosis. No focal lesion. Left Kidney: Length: 13.3 cm. Prominent cortical thickness. This could be secondary to diabetic glomerulonephropathy or acute nephritis. No hydronephrosis. No focal lesion. Bladder: Appears normal for degree of bladder distention. IMPRESSION: Bilateral renal enlargement, more on the right than the left. Prominent cortical thickness. This could be secondary to diabetic glomerulonephropathy or acute nephritis. Electronically Signed   By: Nelson Chimes M.D.   On: 09/15/2016 16:40    Labs:  CBC:  Recent Labs  07/06/16 0521 09/15/16 1154 09/16/16 0650 09/18/16 0551  WBC 5.5 13.7* 10.9* 9.1  HGB 12.9 11.6* 9.9* 9.8*  HCT 41.0 33.1* 29.8* 29.7*  PLT 169 355 364 469*     COAGS:  Recent Labs  05/26/16 0435  INR 1.28  APTT 32    BMP:  Recent Labs  09/15/16 1154 09/16/16 0650 09/17/16 0628 09/18/16 0551  NA 134* 137 137  138 138  138  K 3.2* 3.4* 3.4*  3.5 3.6  3.7  CL 97* 102 103  104 104  104  CO2 18* 20* 19*  19* 17*  17*  GLUCOSE 127* 116* 117*  115* 115*  114*  BUN 120* 121* 124*  123* 120*  119*  CALCIUM 9.4 8.9 8.7*  8.8* 8.7*  8.6*  CREATININE 10.76* 10.66* 10.74*  10.71* 10.29*  10.40*  GFRNONAA 4* 4* 4*  4* 4*  4*  GFRAA 5* 5* 5*  5* 5*  5*    LIVER FUNCTION TESTS:  Recent Labs  06/30/16 0500 09/15/16 0855 09/15/16 1154 09/16/16 0650 09/17/16 0628 09/18/16 0551  BILITOT 1.3* 1.4* 1.6* 1.1  --   --   AST 43* _0 --   --   ALT 41 _1 --   --   ALKPHOS 112 160*  151* 134*  --   --   PROT 7.7 8.5* 8.3* 7.4  --   --   ALBUMIN 3.2* 3.2* 2.6* 2.0* 2.2* 2.1*    TUMOR MARKERS: No results for input(s): AFPTM, CEA, CA199, CHROMGRNA in the last 8760 hours.  Assessment and Plan: Patient with acute renal failure currently undergoing evaluation for definitive diagnosis/cause. IR consulted for random renal biopsy at the request of Dr. Jamal Maes. Biopsy has been tentatively planned for Monday 09/20/16. Patient would like to discuss procedure with her family prior to signing consent.  Will need nephrology paperwork for random renal biopsy completed prior to biopsy.   Thank you for this interesting consult.  I greatly enjoyed meeting [name] [name] and look forward to participating in their care.  A copy of this report was sent to the requesting provider on this date.  Electronically Signed: Docia Barrier 09/18/2016, 11:44 AM   I spent a total of 20 Minutes    in face to face in clinical consultation, greater than 50% of which was counseling/coordinating care for acute renal failure.

## 2016-09-18 NOTE — Progress Notes (Signed)
PROGRESS NOTE    [name] [name]  FIE:[id number] DOB: 1981/04/08 DOA: 09/15/2016   PCP: Wilfred Lacy, NP   Brief Narrative:  36 y.o. female who presented with acute on chronic renal insufficiency possibly secondary to dehydration from poor oral intake and diarrhea and medications in the setting of a UTI.  Assessment & Plan:   Acute renal failure superimposed on stage 3 chronic kidney disease - Patient presents with acute kidney injury in setting of known stage III chronic kidney disease (06/2016- Cr 1.1-1.6) - ? Volume depletion in the setting of diarrhea  - per renal US, large kidneys R > L, also nephrotic proteinuria by UPC --> c/w glomerulonephritis - work up so far: ASO high normal, antiGBM negative, C3, C4 normal, SPEP c/w acute inflammatory response but no monoclonal; ANCA proteinase 3 upper limits normal, dsDNA upper limits normal; kappa and lambda LC's elevated with normal ratio; urine immunofix pending - plan for renal biopsy in 1/29, IR consulted for assistance   Hypokalemia - supplemented and WNL this AM - renal panel in AM  Anemia  - suspect related to acute illness vs CKD - CBC in AM   Malignant hypertension - Recent issues with poor control in the outpatient setting - Current blood pressures controlled  - Continue preadmission hydralazine, Isordil and labetalol    Diarrhea - resolved     Pulmonary hypertension/Chronic diastolic heart failure  - Appears compensated - Diuretics on hold - Continue nitrate    Pneumonitis - unknown etiology. Resp Panel neg - supportive care    Abnormal urinalysis - urine culture- multiple species - WBC trending down - Rocephin day #4, stop today     Chronic respiratory failure with hypercapnia/tracheostomy status 2/2   Morbid obesity/OSA (obstructive sleep apnea) with Obesity hypoventilation syndrome  - Followed as an outpatient at the tracheostomy clinic - Continue CPAP (??via mask vs thru trach) - Outpatient  appointment scheduled with Dr. Elsworth Soho on 1/29 to discuss tracheostomy revision/possible decannulation - Currently not hypoxic  Morbid obesity   - Body mass index is 59.21 kg/m.  DVT prophylaxis: Subcutaneous heparin Code Status: Full Family Communication: no family at bedside Disposition Plan: needs HD/renal biopsy  Consultants:   Nephro  IR  Procedures:    None  Antimicrobials:   Rocephin   Subjective: No concerns this AM.   Objective: Vitals:   09/18/16 0512 09/18/16 0635 09/18/16 0816 09/18/16 1209  BP: (!) 129/59     Pulse: 91  90 80  Resp: _0 Temp: 98.1 F (36.7 C)     TempSrc: Oral     SpO2: 94%  95% 95%  Weight:  (!) 161.4 kg (355 lb 13.2 oz)    Height:        Intake/Output Summary (Last 24 hours) at 09/18/16 1339 Last data filed at 09/18/16 0856  Gross per 24 hour  Intake          1995.83 ml  Output              700 ml  Net          1295.83 ml   Filed Weights   09/15/16 2158 09/16/16 0500 09/18/16 0635  Weight: (!) 149.5 kg (329 lb 9.4 oz) (!) 149.5 kg (329 lb 9.4 oz) (!) 161.4 kg (355 lb 13.2 oz)    Examination:  General exam: Appears calm and comfortable ; trach in place - walking in room Respiratory system: Clear to auscultation. Respiratory effort normal. Cardiovascular system: S1 &  S2 heard, RRR. No JVD, murmurs, rubs, gallops or clicks. No pedal edema. Gastrointestinal system: obese, Abdomen is nondistended, soft and nontender. No organomegaly or masses felt. Central nervous system: Alert and oriented. No focal neurological deficits.  Data Reviewed:   CBC:  Recent Labs Lab 09/15/16 1154 09/16/16 0650 09/18/16 0551  WBC 13.7* 10.9* 9.1  HGB 11.6* 9.9* 9.8*  HCT 33.1* 29.8* 29.7*  MCV 88.7 90.0 91.4  PLT 355 364 314*   Basic Metabolic Panel:  Recent Labs Lab 09/15/16 0855 09/15/16 1154 09/15/16 2232 09/16/16 0650 09/17/16 0628 09/18/16 0551  NA 134* 134*  --  137 137  138 138  138  K 3.0* 3.2*  --  3.4* 3.4*   3.5 3.6  3.7  CL 94* 97*  --  102 103  104 104  104  CO2 21 18*  --  20* 19*  19* 17*  17*  GLUCOSE 134* 127*  --  116* 117*  115* 115*  114*  BUN 112* 120*  --  121* 124*  123* 120*  119*  CREATININE 10.25* 10.76*  --  10.66* 10.74*  10.71* 10.29*  10.40*  CALCIUM 9.6 9.4  --  8.9 8.7*  8.8* 8.7*  8.6*  MG  --   --  2.2  --  2.1 2.2  PHOS  --   --  4.5  --  8.2* 8.7*   Liver Function Tests:  Recent Labs Lab 09/15/16 0855 09/15/16 1154 09/16/16 0650 09/17/16 0628 09/18/16 0551  AST _0 --   --   ALT _1 --   --   ALKPHOS 160* 151* 134*  --   --   BILITOT 1.4* 1.6* 1.1  --   --   PROT 8.5* 8.3* 7.4  --   --   ALBUMIN 3.2* 2.6* 2.0* 2.2* 2.1*   Coagulation Profile:  Recent Labs Lab 09/18/16 1216  INR 1.27   Recent Results (from the past 240 hour(s))  Urine culture     Status: Abnormal   Collection Time: 09/15/16  1:17 PM  Result Value Ref Range Status   Specimen Description URINE, CLEAN CATCH  Final   Special Requests NONE  Final   Culture MULTIPLE SPECIES PRESENT, SUGGEST RECOLLECTION (A)  Final   Report Status 09/16/2016 FINAL  Final  Gastrointestinal Panel by PCR , Stool     Status: None   Collection Time: 09/15/16  4:15 PM  Result Value Ref Range Status   Campylobacter species NOT DETECTED NOT DETECTED Final   Plesimonas shigelloides NOT DETECTED NOT DETECTED Final   Salmonella species NOT DETECTED NOT DETECTED Final   Yersinia enterocolitica NOT DETECTED NOT DETECTED Final   Vibrio species NOT DETECTED NOT DETECTED Final   Vibrio cholerae NOT DETECTED NOT DETECTED Final   Enteroaggregative E coli (EAEC) NOT DETECTED NOT DETECTED Final   Enteropathogenic E coli (EPEC) NOT DETECTED NOT DETECTED Final   Enterotoxigenic E coli (ETEC) NOT DETECTED NOT DETECTED Final   Shiga like toxin producing E coli (STEC) NOT DETECTED NOT DETECTED Final   Shigella/Enteroinvasive E coli (EIEC) NOT DETECTED NOT DETECTED Final   Cryptosporidium NOT  DETECTED NOT DETECTED Final   Cyclospora cayetanensis NOT DETECTED NOT DETECTED Final   Entamoeba histolytica NOT DETECTED NOT DETECTED Final   Giardia lamblia NOT DETECTED NOT DETECTED Final   Adenovirus F40/41 NOT DETECTED NOT DETECTED Final   Astrovirus NOT DETECTED NOT DETECTED Final   Norovirus GI/GII NOT DETECTED NOT  DETECTED Final   Rotavirus A NOT DETECTED NOT DETECTED Final   Sapovirus (I, II, IV, and V) NOT DETECTED NOT DETECTED Final  Culture, blood (Routine X 2) w Reflex to ID Panel     Status: None (Preliminary result)   Collection Time: 09/15/16  5:10 PM  Result Value Ref Range Status   Specimen Description BLOOD LEFT ANTECUBITAL  Final   Special Requests BOTTLES DRAWN AEROBIC AND ANAEROBIC 5CC  Final   Culture NO GROWTH 3 DAYS  Final   Report Status PENDING  Incomplete  Respiratory Panel by PCR     Status: None   Collection Time: 09/15/16  5:58 PM  Result Value Ref Range Status   Adenovirus NOT DETECTED NOT DETECTED Final   Coronavirus 229E NOT DETECTED NOT DETECTED Final   Coronavirus HKU1 NOT DETECTED NOT DETECTED Final   Coronavirus NL63 NOT DETECTED NOT DETECTED Final   Coronavirus OC43 NOT DETECTED NOT DETECTED Final   Metapneumovirus NOT DETECTED NOT DETECTED Final   Rhinovirus / Enterovirus NOT DETECTED NOT DETECTED Final   Influenza A NOT DETECTED NOT DETECTED Final   Influenza B NOT DETECTED NOT DETECTED Final   Parainfluenza Virus 1 NOT DETECTED NOT DETECTED Final   Parainfluenza Virus 2 NOT DETECTED NOT DETECTED Final   Parainfluenza Virus 3 NOT DETECTED NOT DETECTED Final   Parainfluenza Virus 4 NOT DETECTED NOT DETECTED Final   Respiratory Syncytial Virus NOT DETECTED NOT DETECTED Final   Bordetella pertussis NOT DETECTED NOT DETECTED Final   Chlamydophila pneumoniae NOT DETECTED NOT DETECTED Final   Mycoplasma pneumoniae NOT DETECTED NOT DETECTED Final  Culture, blood (Routine X 2) w Reflex to ID Panel     Status: None (Preliminary result)    Collection Time: 09/15/16  8:29 PM  Result Value Ref Range Status   Specimen Description BLOOD LEFT HAND  Final   Special Requests IN PEDIATRIC BOTTLE 1CC  Final   Culture NO GROWTH 3 DAYS  Final   Report Status PENDING  Incomplete    Radiology Studies: No results found.  Scheduled Meds: . cefTRIAXone (ROCEPHIN)  IV  1 g Intravenous Q24H  . heparin  5,000 Units Subcutaneous Q8H  . hydrALAZINE  100 mg Oral Q8H  . isosorbide dinitrate  30 mg Oral TID  . labetalol  100 mg Oral BID  . mouth rinse  15 mL Mouth Rinse BID  . multivitamin with minerals  1 tablet Oral Daily  . sodium bicarbonate  1,300 mg Oral TID  . sodium chloride flush  3 mL Intravenous Q12H   Continuous Infusions: . sodium chloride 50 mL/hr at 09/18/16 0856     LOS: 3 days   Time spent: 25 mins   Faye Ramsay, MD [facility] Pager [phone]  If 7PM-7AM, please contact night-coverage [email or web address] Password [email or web address] 09/18/2016, 1:39 PM

## 2016-09-19 LAB — URINALYSIS, ROUTINE W REFLEX MICROSCOPIC
Bilirubin Urine: NEGATIVE
Glucose, UA: NEGATIVE mg/dL
Ketones, ur: NEGATIVE mg/dL
Nitrite: NEGATIVE
Protein, ur: 30 mg/dL — AB
Specific Gravity, Urine: 1.01 (ref 1.005–1.030)
pH: 5 (ref 5.0–8.0)

## 2016-09-19 LAB — PROTEIN / CREATININE RATIO, URINE
Creatinine, Urine: 103.58 mg/dL
Protein Creatinine Ratio: 1.08 mg/mg{Cre} — ABNORMAL HIGH (ref 0.00–0.15)
Total Protein, Urine: 112 mg/dL

## 2016-09-19 LAB — CBC
HCT: 28.4 % — ABNORMAL LOW (ref 36.0–46.0)
Hemoglobin: 9.4 g/dL — ABNORMAL LOW (ref 12.0–15.0)
MCH: 30.1 pg (ref 26.0–34.0)
MCHC: 33.1 g/dL (ref 30.0–36.0)
MCV: 91 fL (ref 78.0–100.0)
Platelets: 484 10*3/uL — ABNORMAL HIGH (ref 150–400)
RBC: 3.12 MIL/uL — ABNORMAL LOW (ref 3.87–5.11)
RDW: 15.9 % — ABNORMAL HIGH (ref 11.5–15.5)
WBC: 8.7 10*3/uL (ref 4.0–10.5)

## 2016-09-19 LAB — MAGNESIUM: Magnesium: 2.1 mg/dL (ref 1.7–2.4)

## 2016-09-19 LAB — IRON AND TIBC
Iron: 54 ug/dL (ref 28–170)
Saturation Ratios: 24 % (ref 10.4–31.8)
TIBC: 227 ug/dL — ABNORMAL LOW (ref 250–450)
UIBC: 173 ug/dL

## 2016-09-19 LAB — RENAL FUNCTION PANEL
Albumin: 2.3 g/dL — ABNORMAL LOW (ref 3.5–5.0)
Anion gap: 15 (ref 5–15)
BUN: 116 mg/dL — ABNORMAL HIGH (ref 6–20)
CO2: 15 mmol/L — ABNORMAL LOW (ref 22–32)
Calcium: 8.6 mg/dL — ABNORMAL LOW (ref 8.9–10.3)
Chloride: 107 mmol/L (ref 101–111)
Creatinine, Ser: 9.34 mg/dL — ABNORMAL HIGH (ref 0.44–1.00)
GFR calc Af Amer: 6 mL/min — ABNORMAL LOW (ref >60)
GFR calc non Af Amer: 5 mL/min — ABNORMAL LOW (ref >60)
Glucose, Bld: 105 mg/dL — ABNORMAL HIGH (ref 65–99)
Phosphorus: 7.5 mg/dL — ABNORMAL HIGH (ref 2.5–4.6)
Potassium: 3.7 mmol/L (ref 3.5–5.1)
Sodium: 137 mmol/L (ref 135–145)

## 2016-09-19 NOTE — Progress Notes (Signed)
Patient refused trach care at this time.  RN asked patient about trach care at 2039 and patient stated that she would not like trach care at this time and would maybe like it done before she went to sleep but would let the RN know.  Patient now stating that she will do trach care in the morning and that she usually only does trach care once a day.  Will continue to monitor patient.

## 2016-09-19 NOTE — Progress Notes (Signed)
[facility] Rounding Note  Subjective/Interval History:  Says still feels fine Appetite has remained good, no nausea No SOB IVF at 50 UOP has clearly picked up  Objective Vital signs in last 24 hours: Vitals:   09/18/16 2122 09/19/16 0500 09/19/16 0530 09/19/16 0815  BP: 137/68  128/60   Pulse: 89  89 90  Resp: _0 Temp: 98 F (36.7 C)  98.3 F (36.8 C)   TempSrc: Oral  Oral   SpO2: 96%  100% 95%  Weight:  (!) 155.7 kg (343 lb 4.1 oz)    Height:        Intake/Output Summary (Last 24 hours) at 09/19/16 1249 Last data filed at 09/19/16 0953  Gross per 24 hour  Intake          1549.17 ml  Output             1300 ml  Net           249.17 ml   Physical Exam:  Blood pressure 128/60, pulse 90, temperature 98.3 F (36.8 C), temperature source Oral, resp. rate 16, height _1  (1.651 m), weight (!) 155.7 kg (343 lb 4.1 oz), SpO2 95 %, unknown if currently breastfeeding.  Very nice SMO AAF.  Still looks well, and NAD On trach collar Chest remains entirely clear to auscultation without crackles or wheezes Large neck, cannot see neck veins  S1S2 No S3 No murmur and no rub Abdomen: soft, non-distended. Obese. No focal tenderness. + BS No edema of LE's Alert, Ox3, no focal deficit, no asterixus  Labs:  Recent Labs Lab 09/15/16 0855 09/15/16 1154 09/15/16 2232 09/16/16 0650 09/17/16 0628 09/18/16 0551 09/19/16 0512  NA 134* 134*  --  137 137  138 138  138 137  K 3.0* 3.2*  --  3.4* 3.4*  3.5 3.6  3.7 3.7  CL 94* 97*  --  102 103  104 104  104 107  CO2 21 18*  --  20* 19*  19* 17*  17* 15*  GLUCOSE 134* 127*  --  116* 117*  115* 115*  114* 105*  BUN 112* 120*  --  121* 124*  123* 120*  119* 116*  CREATININE 10.25* 10.76*  --  10.66* 10.74*  10.71* 10.29*  10.40* 9.34*  CALCIUM 9.6 9.4  --  8.9 8.7*  8.8* 8.7*  8.6* 8.6*  PHOS  --   --  4.5  --  8.2* 8.7* 7.5*    Recent Labs Lab 09/15/16 0855 09/15/16 1154 09/16/16 0650 09/17/16 0628 09/18/16 0551  09/19/16 0512  AST _2 --   --   --   ALT _3 --   --   --   ALKPHOS 160* 151* 134*  --   --   --   BILITOT 1.4* 1.6* 1.1  --   --   --   PROT 8.5* 8.3* 7.4  --   --   --   ALBUMIN 3.2* 2.6* 2.0* 2.2* 2.1* 2.3*    Recent Labs Lab 09/15/16 1154 09/16/16 0650 09/18/16 0551 09/19/16 0512  WBC 13.7* 10.9* 9.1 8.7  HGB 11.6* 9.9* 9.8* 9.4*  HCT 33.1* 29.8* 29.7* 28.4*  MCV 88.7 90.0 91.4 91.0  PLT 355 364 469* 484*   Results for VICKY, SCHLEICH (MRN [id number]) as of 09/18/2016 10:29  Ref. Range 09/15/2016 20:32  ANA Ab, IFA Unknown Negative  ANCA Proteinase 3 Latest Ref Range: 0.0 - 3.5  U/mL 3.5  ASO Latest Ref Range: 0.0 - 200.0 IU/mL 175.0  ds DNA Ab Latest Ref Range: 0 - 9 IU/mL 9  GBM Ab Latest Ref Range: 0 - 20 units 4  Myeloperoxidase Abs Latest Ref Range: 0.0 - 9.0 U/mL <9.0   Results for FENDI, MEINHARDT (MRN [id number]) as of 09/18/2016 10:29  Ref. Range 09/15/2016 20:32  C3 Complement Latest Ref Range: 82 - 167 mg/dL 145  Complement C4, Body Fluid Latest Ref Range: 14 - 44 mg/dL 39   Results for GRACIEANN, STANNARD (MRN [id number]) as of 09/18/2016 10:29  Ref. Range 09/15/2016 20:32  Kappa free light chain Latest Ref Range: 3.3 - 19.4 mg/L 195.2 (H)  Lamda free light chains Latest Ref Range: 5.7 - 26.3 mg/L 145.1 (H)  Kappa, lamda light chain ratio Latest Ref Range: 0.26 - 1.65  1.35    Studies/Results: Dg Chest 2 View  Result Date: 09/15/2016 CLINICAL DATA:  URI. EXAM: CHEST  2 VIEW COMPARISON:  06/28/2016 . FINDINGS: Tracheostomy tube noted in good anatomic position. Cardiomegaly with mild pulmonary vascular prominence and bilateral interstitial prominence consistent congestive heart failure. Interstitial pneumonitis cannot be excluded. No pleural effusion or pneumothorax. IMPRESSION: 1. Tracheostomy tube noted good anatomic position. 2. Cardiomegaly with mild pulmonary vascular prominence and bilateral interstitial prominence consistent with CHF. Interstitial  pneumonitis cannot be excluded . Electronically Signed   By: Marcello Moores  Register   On: 09/15/2016 13:57   US Renal  Result Date: 09/15/2016 CLINICAL DATA:  Acute kidney injury.  UTI.  Hypertension.  Diabetes. EXAM: RENAL / URINARY TRACT ULTRASOUND COMPLETE COMPARISON:  None. FINDINGS: Right Kidney: Length: 14.9 cm. Prominent cortical thickness. This could be secondary to diabetic glomerulonephropathy or acute nephritis. No hydronephrosis. No focal lesion. Left Kidney: Length: 13.3 cm. Prominent cortical thickness. This could be secondary to diabetic glomerulonephropathy or acute nephritis. No hydronephrosis. No focal lesion. Bladder: Appears normal for degree of bladder distention. IMPRESSION: Bilateral renal enlargement, more on the right than the left. Prominent cortical thickness. This could be secondary to diabetic glomerulonephropathy or acute nephritis. Electronically Signed   By: Nelson Chimes M.D.   On: 09/15/2016 16:40   Medications:  . heparin  5,000 Units Subcutaneous Q8H  . hydrALAZINE  100 mg Oral Q8H  . isosorbide dinitrate  30 mg Oral TID  . labetalol  100 mg Oral BID  . mouth rinse  15 mL Mouth Rinse BID  . multivitamin with minerals  1 tablet Oral Daily  . sodium bicarbonate  1,300 mg Oral TID  . sodium chloride flush  3 mL Intravenous Q12H    Background: 36 y.o. year-old AAF with PMH sig for super morbid opbesity, hypertension with h/o HT urgencies and poor control 2/2 non-compliance, OSA/OHV with chronic hypercapnic resp failure (s/p prolonged hospitalization 05/2016-06/2016 requiring trach 05/2016) and now with chronic trach, dCHF,  H/o gestational DM. Baseline creatinine has varied from as low as 0.86 to as high as 1.69 over past 4 months, and was 1.13 as recently as 07/12/16.  Went to primary care to get BP meds refilled (was out of her lasix, labetalol, isosorbide, Klor Con, had only a bottle of hydralazine), had labs drawn, creatinine returned at 10.25 - was sent to the ED -  labs repeated - confirmed creatinine 10.76. UA looked infected, but also with new proteinuria (neg on UA 05/2016) and UPC of 3.29 gm.  UNa 30 with FeNa of 2.2% so not c/w pre-renal or Na avid state. No ACE, ARB, NSAID or PPI.  Renal asked to evaluate  Assessment/Recommendations  1. Renal failure - acute vs subacute on CKD3. New since November (creatinine 1/13 07/12/17; generally 0.9-1.69 past 4 mos). No ACE, ARB, NSAID or PPI. No documented hypotension though possible vol depletion 1. Could have been somewhat volume depleted d/t recent diarrheal illness, but no documented hypotension PTA (checks BP at home) and no symptoms of same.  2. Lack of uremic symptoms with current numbers suggested process may be more subacute than acute 3. High FeNa speaks against a prerenal state.  4. Has near nephrotic proteinuria (3.29 gm) by UPC (new since Oct), microscopic hematuria, large kidneys (14.9, 13.3) on ultrasound. Picture most c/w glomerulonephritis   5. W/U - so far: ASO high normal, antiGBM negative, C3, C4 normal, SPEP c/w acute inflammatory response but no monoclonal; ANCA proteinase 3 upper limits normal, dsDNA upper limits normal; kappa and lambda LC's elevated with normal ratio; urine immunofix pending  6. For the first time today creatinine has dropped an entire mg% and her UOP is clearly increasing. Will postpone biopsy for now, trend creatinine and UOP. Still have the issue of enlarged kidneys, microhematuria and proteinuria to deal with - will repeat a UA and UPC just to see if any change. Re-evaluate need for bx.  2. Hypokalemia - stable post repletion 3. Anemia - progressive since adm. Now <10. Transferrin sat 24%, normal serum Fe. Give dose Aranesp 100. 4. Diarrhea - resolved 5. Cough/URI - resp panel negative 6. Hypertension with H/O HT urgencies and severe HTN (no RAS by Duplex 2012)- normotensive on admission despite being out of labetolol, isosorbide, furosemide for 3+ days (only had  hydralazine in her bag) 1. BP normal at this time (Reduced labetolol to 100 BID (from 300) and continued same hydralazine and isosorbide) 7. OSA/OHV - chronic trach 8. H/o dCHF - no CHF sx at this time 9. Metabolic acidosis - increased po bicarb to 1300 TID 10. Pyuria/bacteruria.  Rocephin course completed. UC neg for predominant pathogen.  Jamal Maes, MD [facility] [phone] pager 09/19/2016, 12:49 PM

## 2016-09-19 NOTE — Progress Notes (Signed)
PROGRESS NOTE    [name] [name]  WKG:[id number] DOB: 12/15/1980 DOA: 09/15/2016   PCP: Wilfred Lacy, NP   Brief Narrative:  36 y.o. female who presented with acute on chronic renal insufficiency possibly secondary to dehydration from poor oral intake and diarrhea and medications in the setting of a UTI.  Assessment & Plan:   Acute renal failure superimposed on stage 3 chronic kidney disease - Patient presents with acute kidney injury in setting of known stage III chronic kidney disease (06/2016- Cr 1.1-1.6) - ? Volume depletion in the setting of diarrhea  - per renal US, large kidneys R > L, also nephrotic proteinuria by UPC --> c/w glomerulonephritis - work up so far: ASO high normal, antiGBM negative, C3, C4 normal, SPEP c/w acute inflammatory response but no monoclonal; ANCA proteinase 3 upper limits normal, dsDNA upper limits normal; kappa and lambda LC's elevated with normal ratio; urine immunofix pending - plan for renal biopsy in 1/29, IR consulted for assistance  - encouraged pt to discuss consideration of renal biopsy   Hypokalemia - supplemented and WNL this AM - renal panel in AM  Anemia  - suspect related to acute illness vs CKD - CBC in AM   Malignant hypertension - Recent issues with poor control in the outpatient setting - Current blood pressures controlled  - Continue preadmission hydralazine, Isordil and labetalol    Diarrhea - resolved     Pulmonary hypertension/Chronic diastolic heart failure  - Appears compensated - Diuretics on hold - Continue nitrate    Pneumonitis - unknown etiology. 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 Elsworth Soho on 1/29 to discuss tracheostomy revision/possible decannulation - Currently not hypoxic  Morbid obesity   - Body mass index is 59.21 kg/m.  DVT prophylaxis: SQ heparin Code Status: Full Family Communication: no family at bedside Disposition Plan: needs HD, /renal biopsy, home once decision finalized on renal biopsy   Consultants:   Nephro  IR for renal biopsy   Procedures:    None  Antimicrobials:   Rocephin therapy completed   Subjective: No concerns this AM.   Objective: Vitals:   09/18/16 2122 09/19/16 0500 09/19/16 0530 09/19/16 0815  BP: 137/68  128/60   Pulse: 89  89 90  Resp: _0 Temp: 98 F (36.7 C)  98.3 F (36.8 C)   TempSrc: Oral  Oral   SpO2: 96%  100% 95%  Weight:  (!) 155.7 kg (343 lb 4.1 oz)    Height:        Intake/Output Summary (Last 24 hours) at 09/19/16 1232 Last data filed at 09/19/16 0953  Gross per 24 hour  Intake          1549.17 ml  Output             1300 ml  Net           249.17 ml   Filed Weights   09/16/16 0500 09/18/16 0635 09/19/16 0500  Weight: (!) 149.5 kg (329 lb 9.4 oz) (!) 161.4 kg (355 lb 13.2 oz) (!) 155.7 kg (343 lb 4.1 oz)    Examination:  General  exam: Appears calm and comfortable ; trach in place - walking in room Respiratory system: Clear to auscultation. Respiratory effort normal. Cardiovascular system: S1 & S2 heard, RRR. No JVD, murmurs, rubs, gallops or clicks. No pedal edema. Gastrointestinal system: obese, Abdomen is nondistended, soft and nontender. No organomegaly or masses felt. Central nervous system: Alert and oriented. No focal neurological deficits.  Data Reviewed:   CBC:  Recent Labs Lab 09/15/16 1154 09/16/16 0650 09/18/16 0551 09/19/16 0512  WBC 13.7* 10.9* 9.1 8.7  HGB 11.6* 9.9* 9.8* 9.4*  HCT 33.1* 29.8* 29.7* 28.4*  MCV 88.7 90.0 91.4 91.0  PLT 355 364 469* 803*   Basic Metabolic Panel:  Recent  Labs Lab 09/15/16 1154 09/15/16 2232 09/16/16 0650 09/17/16 0628 09/18/16 0551 09/19/16 0512  NA 134*  --  137 137  138 138  138 137  K 3.2*  --  3.4* 3.4*  3.5 3.6  3.7 3.7  CL 97*  --  102 103  104 104  104 107  CO2 18*  --  20* 19*  19* 17*  17* 15*  GLUCOSE 127*  --  116* 117*  115* 115*  114* 105*  BUN 120*  --  121* 124*  123* 120*  119* 116*  CREATININE 10.76*  --  10.66* 10.74*  10.71* 10.29*  10.40* 9.34*  CALCIUM 9.4  --  8.9 8.7*  8.8* 8.7*  8.6* 8.6*  MG  --  2.2  --  2.1 2.2 2.1  PHOS  --  4.5  --  8.2* 8.7* 7.5*   Liver Function Tests:  Recent Labs Lab 09/15/16 0855 09/15/16 1154 09/16/16 0650 09/17/16 0628 09/18/16 0551 09/19/16 0512  AST _0 --   --   --   ALT _1 --   --   --   ALKPHOS 160* 151* 134*  --   --   --   BILITOT 1.4* 1.6* 1.1  --   --   --   PROT 8.5* 8.3* 7.4  --   --   --   ALBUMIN 3.2* 2.6* 2.0* 2.2* 2.1* 2.3*   Coagulation Profile:  Recent Labs Lab 09/18/16 1216  INR 1.27   Recent Results (from the past 240 hour(s))  Urine culture     Status: Abnormal   Collection Time: 09/15/16  1:17 PM  Result Value Ref Range Status   Specimen Description URINE, CLEAN CATCH  Final   Special Requests NONE  Final   Culture MULTIPLE SPECIES PRESENT, SUGGEST RECOLLECTION (A)  Final   Report Status 09/16/2016 FINAL  Final  Gastrointestinal Panel by PCR , Stool     Status: None   Collection Time: 09/15/16  4:15 PM  Result Value Ref Range Status   Campylobacter species NOT DETECTED NOT DETECTED Final   Plesimonas shigelloides NOT DETECTED NOT DETECTED Final   Salmonella species NOT DETECTED NOT DETECTED Final   Yersinia enterocolitica NOT DETECTED NOT DETECTED Final   Vibrio species NOT DETECTED NOT DETECTED Final   Vibrio cholerae NOT DETECTED NOT DETECTED Final   Enteroaggregative E coli (EAEC) NOT DETECTED NOT DETECTED Final   Enteropathogenic E coli (EPEC) NOT DETECTED NOT DETECTED Final   Enterotoxigenic E coli  (ETEC) NOT DETECTED NOT DETECTED Final   Shiga like toxin producing E coli (STEC) NOT DETECTED NOT DETECTED Final   Shigella/Enteroinvasive E coli (EIEC) NOT DETECTED NOT DETECTED Final   Cryptosporidium NOT DETECTED NOT DETECTED Final   Cyclospora cayetanensis NOT  DETECTED NOT DETECTED Final   Entamoeba histolytica NOT DETECTED NOT DETECTED Final   Giardia lamblia NOT DETECTED NOT DETECTED Final   Adenovirus F40/41 NOT DETECTED NOT DETECTED Final   Astrovirus NOT DETECTED NOT DETECTED Final   Norovirus GI/GII NOT DETECTED NOT DETECTED Final   Rotavirus A NOT DETECTED NOT DETECTED Final   Sapovirus (I, II, IV, and V) NOT DETECTED NOT DETECTED Final  Culture, blood (Routine X 2) w Reflex to ID Panel     Status: None (Preliminary result)   Collection Time: 09/15/16  5:10 PM  Result Value Ref Range Status   Specimen Description BLOOD LEFT ANTECUBITAL  Final   Special Requests BOTTLES DRAWN AEROBIC AND ANAEROBIC 5CC  Final   Culture NO GROWTH 4 DAYS  Final   Report Status PENDING  Incomplete  Respiratory Panel by PCR     Status: None   Collection Time: 09/15/16  5:58 PM  Result Value Ref Range Status   Adenovirus NOT DETECTED NOT DETECTED Final   Coronavirus 229E NOT DETECTED NOT DETECTED Final   Coronavirus HKU1 NOT DETECTED NOT DETECTED Final   Coronavirus NL63 NOT DETECTED NOT DETECTED Final   Coronavirus OC43 NOT DETECTED NOT DETECTED Final   Metapneumovirus NOT DETECTED NOT DETECTED Final   Rhinovirus / Enterovirus NOT DETECTED NOT DETECTED Final   Influenza A NOT DETECTED NOT DETECTED Final   Influenza B NOT DETECTED NOT DETECTED Final   Parainfluenza Virus 1 NOT DETECTED NOT DETECTED Final   Parainfluenza Virus 2 NOT DETECTED NOT DETECTED Final   Parainfluenza Virus 3 NOT DETECTED NOT DETECTED Final   Parainfluenza Virus 4 NOT DETECTED NOT DETECTED Final   Respiratory Syncytial Virus NOT DETECTED NOT DETECTED Final   Bordetella pertussis NOT DETECTED NOT DETECTED Final    Chlamydophila pneumoniae NOT DETECTED NOT DETECTED Final   Mycoplasma pneumoniae NOT DETECTED NOT DETECTED Final  Culture, blood (Routine X 2) w Reflex to ID Panel     Status: None (Preliminary result)   Collection Time: 09/15/16  8:29 PM  Result Value Ref Range Status   Specimen Description BLOOD LEFT HAND  Final   Special Requests IN PEDIATRIC BOTTLE 1CC  Final   Culture NO GROWTH 4 DAYS  Final   Report Status PENDING  Incomplete    Radiology Studies: No results found.  Scheduled Meds: . heparin  5,000 Units Subcutaneous Q8H  . hydrALAZINE  100 mg Oral Q8H  . isosorbide dinitrate  30 mg Oral TID  . labetalol  100 mg Oral BID  . mouth rinse  15 mL Mouth Rinse BID  . multivitamin with minerals  1 tablet Oral Daily  . sodium bicarbonate  1,300 mg Oral TID  . sodium chloride flush  3 mL Intravenous Q12H   Continuous Infusions: . sodium chloride 50 mL/hr at 09/19/16 0433    LOS: 4 days   Time spent: 25 mins   Faye Ramsay, MD [facility] Pager [phone]  If 7PM-7AM, please contact night-coverage [email or web address] Password [facility] 09/19/2016, 12:32 PM

## 2016-09-19 NOTE — Progress Notes (Signed)
Pt refused trach care. Dressing changed and inner canula changed during the day, pt agreed to have trach care tomorrow.

## 2016-09-20 ENCOUNTER — Institutional Professional Consult (permissible substitution): Payer: Self-pay | Admitting: Pulmonary Disease

## 2016-09-20 DIAGNOSIS — R8290 Unspecified abnormal findings in urine: Secondary | ICD-10-CM

## 2016-09-20 LAB — RENAL FUNCTION PANEL
Albumin: 2.3 g/dL — ABNORMAL LOW (ref 3.5–5.0)
Anion gap: 12 (ref 5–15)
BUN: 104 mg/dL — ABNORMAL HIGH (ref 6–20)
CO2: 19 mmol/L — ABNORMAL LOW (ref 22–32)
Calcium: 8.9 mg/dL (ref 8.9–10.3)
Chloride: 109 mmol/L (ref 101–111)
Creatinine, Ser: 8.51 mg/dL — ABNORMAL HIGH (ref 0.44–1.00)
GFR calc Af Amer: 6 mL/min — ABNORMAL LOW (ref >60)
GFR calc non Af Amer: 5 mL/min — ABNORMAL LOW (ref >60)
Glucose, Bld: 101 mg/dL — ABNORMAL HIGH (ref 65–99)
Phosphorus: 7.8 mg/dL — ABNORMAL HIGH (ref 2.5–4.6)
Potassium: 3.5 mmol/L (ref 3.5–5.1)
Sodium: 140 mmol/L (ref 135–145)

## 2016-09-20 LAB — IMMUNOFIXATION ELECTROPHORESIS
IgA: 460 mg/dL — ABNORMAL HIGH (ref 87–352)
IgG (Immunoglobin G), Serum: 1584 mg/dL (ref 700–1600)
IgM, Serum: 115 mg/dL (ref 26–217)
Total Protein ELP: 6.7 g/dL (ref 6.0–8.5)

## 2016-09-20 LAB — CULTURE, BLOOD (ROUTINE X 2)
Culture: NO GROWTH
Culture: NO GROWTH

## 2016-09-20 LAB — CBC
HCT: 27.6 % — ABNORMAL LOW (ref 36.0–46.0)
Hemoglobin: 9.1 g/dL — ABNORMAL LOW (ref 12.0–15.0)
MCH: 30.2 pg (ref 26.0–34.0)
MCHC: 33 g/dL (ref 30.0–36.0)
MCV: 91.7 fL (ref 78.0–100.0)
Platelets: 469 10*3/uL — ABNORMAL HIGH (ref 150–400)
RBC: 3.01 MIL/uL — ABNORMAL LOW (ref 3.87–5.11)
RDW: 16 % — ABNORMAL HIGH (ref 11.5–15.5)
WBC: 8.4 10*3/uL (ref 4.0–10.5)

## 2016-09-20 NOTE — Progress Notes (Signed)
Subjective: Interval History: has no complaint, but wants to go home.  Objective: Vital signs in last 24 hours: Temp:  [98.2 F (36.8 C)] 98.2 F (36.8 C) (01/29 0630) Pulse Rate:  [82-97] 93 (01/29 0802) Resp:  [16-18] 16 (01/29 0802) BP: (120-132)/(66-71) 120/71 (01/29 0630) SpO2:  [92 %-100 %] 97 % (01/29 0802) FiO2 (%):  [30 %] 30 % (01/29 0802) Weight:  [155 kg (341 lb 11.4 oz)] 155 kg (341 lb 11.4 oz) (01/29 0500) Weight change: -0.7 kg (-1 lb 8.7 oz)  Intake/Output from previous day: 01/28 0701 - 01/29 0700 In: 220 [P.O.:220] Out: 1450 [Urine:1450] Intake/Output this shift: No intake/output data recorded.  General appearance: alert, cooperative, morbidly obese and pale Resp: diminished breath sounds bilaterally Cardio: S1, S2 normal GI: obese, pos bs, soft Extremities: edema 1+  Lab Results:  Recent Labs  09/19/16 0512 09/20/16 0604  WBC 8.7 8.4  HGB 9.4* 9.1*  HCT 28.4* 27.6*  PLT 484* 469*   BMET:  Recent Labs  09/19/16 0512 09/20/16 0604  NA 137 140  K 3.7 3.5  CL 107 109  CO2 15* 19*  GLUCOSE 105* 101*  BUN 116* 104*  CREATININE 9.34* 8.51*  CALCIUM 8.6* 8.9   No results for input(s): PTH in the last 72 hours. Iron Studies:  Recent Labs  09/19/16 0512  IRON 54  TIBC 227*    Studies/Results: No results found.  I have reviewed the patient's current medications.  Assessment/Plan: 1 AKI vol xs mild, mild acidemia, nonoliguric improving urine vol, and Cr falling . Will cont conservative tx, suspect ATN 2 Anemia given esa 3 obesity 4HTN ok off meds P follow Cr, no intervention     LOS: 5 days   Jerett Odonohue L 09/20/2016,11:27 AM

## 2016-09-20 NOTE — Progress Notes (Signed)
[facility] PROGRESS NOTE    Progress Note  [name] [name]  VOH:[id number] DOB: 23-Aug-1981 DOA: 09/15/2016 PCP: Wilfred Lacy, NP     Brief Narrative:   [name] [name] is an 36 y.o. female presented with acute on chronic renal insufficiency possibly secondary to dehydration from poor oral intake and diarrhea and medications in the setting of a UTI.  Assessment/Plan:   Acute renal failure superimposed on stage 3chronic kidney disease - Patient presents with acute kidney injury in setting of known stage III chronic kidney disease (06/2016- Cr 1.1-1.6) - Unclear etiology, FENA greater than 1 point against prerenal state. Initially was started on empiric Rocephin urine culture inconclusive com pleted a 5 day course - per renal US, large kidneys. Creatinine continues to improve slowly, appreciate renal's assistance, continue to monitor urine output. - work up so far: ASO high normal, antiGBM negative, C3, C4 normal, SPEP c/w acute inflammatory response but no monoclonal; ANCA proteinase 3 upper limits normal, dsDNA upper limits normal; kappa and lambda LC's elevated with normal ratio; urine immunofix pending. - Nephrology UA.  Hypokalemia - supplemented and WNL this AM - renal panel in AM  Anemia  - suspect related to acute illness vs CKD - Hemoglobin is stable.  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 Elsworth Soho on 1/29 to  discuss tracheostomy revision/possible decannulation - Currently not hypoxic.  Morbid obesity  - Body mass index is 59.21 kg/m.  Thrombocytosis: Plt's are Slowly improving.  DVT prophylaxis: lovenox Family Communication:None Disposition Plan/Barrier to D/C: home in 2-3 days Code Status:     Code Status Orders        Start     Ordered   09/15/16 2204  Full code  Continuous     09/15/16 2203    Code Status History    Date Active Date Inactive Code Status Order ID Comments User Context   06/29/2016  3:40 PM 06/29/2016  3:40 PM Full Code [id number]  Cathlyn Parsons, PA-C Inpatient   06/29/2016  3:40 PM 07/06/2016  4:15 PM Full Code [id number]  Cathlyn Parsons, PA-C Inpatient   05/25/2016  9:11 PM 06/29/2016  3:09 PM Full Code [id number]  Lily Kocher, MD Inpatient   05/26/2013 10:30 AM 05/28/2013  2:32 PM Full Code [id number]  Logan Bores, MD Inpatient        IV Access:    Peripheral IV   Procedures and diagnostic studies:   No results found.   Medical Consultants:    None.  Anti-Infectives:   none  Subjective:    [name] [name] she relates she feels great would like to go home.  Objective:    Vitals:   09/19/16 2234 09/20/16 0500 09/20/16 0630 09/20/16 0802  BP:   120/71   Pulse: 95  92 93  Resp:   18 16  Temp: 98.2 F (36.8 C)  98.2 F (36.8 C)   TempSrc: Oral  Oral   SpO2: 99%  95% 97%  Weight:  (!) 155 kg (341 lb 11.4 oz)  Height:        Intake/Output Summary (Last 24 hours) at 09/20/16 0815 Last data filed at 09/20/16 6701  Gross per 24 hour  Intake              220 ml  Output             1450 ml  Net            -1230 ml   Filed Weights   09/18/16 0635 09/19/16 0500 09/20/16 0500  Weight: (!) 161.4 kg (355 lb 13.2 oz) (!) 155.7 kg (343 lb 4.1 oz) (!) 155 kg (341 lb 11.4 oz)    Exam: General exam: In no acute distress. Respiratory system: Good air movement and clear to auscultation. Cardiovascular system: S1 & S2 heard,  RRR. No JVD, murmurs, rubs, gallops or clicks.  Gastrointestinal system: Abdomen is nondistended, soft and nontender.  Central nervous system: Alert and oriented. No focal neurological deficits. Extremities: No pedal edema. Skin: No rashes, lesions or ulcers Psychiatry: Judgement and insight appear normal. Mood & affect appropriate.    Data Reviewed:    Labs: Basic Metabolic Panel:  Recent Labs Lab 09/15/16 2232 09/16/16 0650 09/17/16 4103 09/18/16 0551 09/19/16 0512 09/20/16 0604  NA  --  137 137  138 138  138 137 140  K  --  3.4* 3.4*  3.5 3.6  3.7 3.7 3.5  CL  --  102 103  104 104  104 107 109  CO2  --  20* 19*  19* 17*  17* 15* 19*  GLUCOSE  --  116* 117*  115* 115*  114* 105* 101*  BUN  --  121* 124*  123* 120*  119* 116* 104*  CREATININE  --  10.66* 10.74*  10.71* 10.29*  10.40* 9.34* 8.51*  CALCIUM  --  8.9 8.7*  8.8* 8.7*  8.6* 8.6* 8.9  MG 2.2  --  2.1 2.2 2.1  --   PHOS 4.5  --  8.2* 8.7* 7.5* 7.8*   GFR Estimated Creatinine Clearance: 13.7 mL/min (by C-G formula based on SCr of 8.51 mg/dL (H)). Liver Function Tests:  Recent Labs Lab 09/15/16 0855 09/15/16 1154 09/16/16 0650 09/17/16 0628 09/18/16 0551 09/19/16 0512 09/20/16 0604  AST _0 --   --   --   --   ALT _1 --   --   --   --   ALKPHOS 160* 151* 134*  --   --   --   --   BILITOT 1.4* 1.6* 1.1  --   --   --   --   PROT 8.5* 8.3* 7.4  --   --   --   --   ALBUMIN 3.2* 2.6* 2.0* 2.2* 2.1* 2.3* 2.3*   No results for input(s): LIPASE, AMYLASE in the last 168 hours. No results for input(s): AMMONIA in the last 168 hours. Coagulation profile  Recent Labs Lab 09/18/16 1216  INR 1.27    CBC:  Recent Labs Lab 09/15/16 1154 09/16/16 0650 09/18/16 0551 09/19/16 0512 09/20/16 0604  WBC 13.7* 10.9* 9.1 8.7 8.4  HGB 11.6* 9.9* 9.8* 9.4* 9.1*  HCT 33.1* 29.8* 29.7* 28.4* 27.6*  MCV 88.7 90.0 91.4 91.0 91.7  PLT 355 364 469* 484* 469*   Cardiac Enzymes: No  results for input(s): CKTOTAL, CKMB, CKMBINDEX, TROPONINI in the last 168 hours. BNP (last 3 results) No results for input(s): PROBNP in the last 8760 hours. CBG: No  results for input(s): GLUCAP in the last 168 hours. D-Dimer: No results for input(s): DDIMER in the last 72 hours. Hgb A1c: No results for input(s): HGBA1C in the last 72 hours. Lipid Profile: No results for input(s): CHOL, HDL, LDLCALC, TRIG, CHOLHDL, LDLDIRECT in the last 72 hours. Thyroid function studies: No results for input(s): TSH, T4TOTAL, T3FREE, THYROIDAB in the last 72 hours.  Invalid input(s): FREET3 Anemia work up:  Recent Labs  09/19/16 0512  TIBC 227*  IRON 54   Sepsis Labs:  Recent Labs Lab 09/16/16 0650 09/18/16 0551 09/19/16 0512 09/20/16 0604  WBC 10.9* 9.1 8.7 8.4   Microbiology Recent Results (from the past 240 hour(s))  Urine culture     Status: Abnormal   Collection Time: 09/15/16  1:17 PM  Result Value Ref Range Status   Specimen Description URINE, CLEAN CATCH  Final   Special Requests NONE  Final   Culture MULTIPLE SPECIES PRESENT, SUGGEST RECOLLECTION (A)  Final   Report Status 09/16/2016 FINAL  Final  Gastrointestinal Panel by PCR , Stool     Status: None   Collection Time: 09/15/16  4:15 PM  Result Value Ref Range Status   Campylobacter species NOT DETECTED NOT DETECTED Final   Plesimonas shigelloides NOT DETECTED NOT DETECTED Final   Salmonella species NOT DETECTED NOT DETECTED Final   Yersinia enterocolitica NOT DETECTED NOT DETECTED Final   Vibrio species NOT DETECTED NOT DETECTED Final   Vibrio cholerae NOT DETECTED NOT DETECTED Final   Enteroaggregative E coli (EAEC) NOT DETECTED NOT DETECTED Final   Enteropathogenic E coli (EPEC) NOT DETECTED NOT DETECTED Final   Enterotoxigenic E coli (ETEC) NOT DETECTED NOT DETECTED Final   Shiga like toxin producing E coli (STEC) NOT DETECTED NOT DETECTED Final   Shigella/Enteroinvasive E coli (EIEC) NOT DETECTED NOT DETECTED  Final   Cryptosporidium NOT DETECTED NOT DETECTED Final   Cyclospora cayetanensis NOT DETECTED NOT DETECTED Final   Entamoeba histolytica NOT DETECTED NOT DETECTED Final   Giardia lamblia NOT DETECTED NOT DETECTED Final   Adenovirus F40/41 NOT DETECTED NOT DETECTED Final   Astrovirus NOT DETECTED NOT DETECTED Final   Norovirus GI/GII NOT DETECTED NOT DETECTED Final   Rotavirus A NOT DETECTED NOT DETECTED Final   Sapovirus (I, II, IV, and V) NOT DETECTED NOT DETECTED Final  Culture, blood (Routine X 2) w Reflex to ID Panel     Status: None (Preliminary result)   Collection Time: 09/15/16  5:10 PM  Result Value Ref Range Status   Specimen Description BLOOD LEFT ANTECUBITAL  Final   Special Requests BOTTLES DRAWN AEROBIC AND ANAEROBIC 5CC  Final   Culture NO GROWTH 4 DAYS  Final   Report Status PENDING  Incomplete  Respiratory Panel by PCR     Status: None   Collection Time: 09/15/16  5:58 PM  Result Value Ref Range Status   Adenovirus NOT DETECTED NOT DETECTED Final   Coronavirus 229E NOT DETECTED NOT DETECTED Final   Coronavirus HKU1 NOT DETECTED NOT DETECTED Final   Coronavirus NL63 NOT DETECTED NOT DETECTED Final   Coronavirus OC43 NOT DETECTED NOT DETECTED Final   Metapneumovirus NOT DETECTED NOT DETECTED Final   Rhinovirus / Enterovirus NOT DETECTED NOT DETECTED Final   Influenza A NOT DETECTED NOT DETECTED Final   Influenza B NOT DETECTED NOT DETECTED Final   Parainfluenza Virus 1 NOT DETECTED NOT DETECTED Final   Parainfluenza Virus 2 NOT DETECTED NOT DETECTED Final   Parainfluenza Virus 3 NOT DETECTED NOT  DETECTED Final   Parainfluenza Virus 4 NOT DETECTED NOT DETECTED Final   Respiratory Syncytial Virus NOT DETECTED NOT DETECTED Final   Bordetella pertussis NOT DETECTED NOT DETECTED Final   Chlamydophila pneumoniae NOT DETECTED NOT DETECTED Final   Mycoplasma pneumoniae NOT DETECTED NOT DETECTED Final  Culture, blood (Routine X 2) w Reflex to ID Panel     Status: None  (Preliminary result)   Collection Time: 09/15/16  8:29 PM  Result Value Ref Range Status   Specimen Description BLOOD LEFT HAND  Final   Special Requests IN PEDIATRIC BOTTLE 1CC  Final   Culture NO GROWTH 4 DAYS  Final   Report Status PENDING  Incomplete     Medications:   . heparin  5,000 Units Subcutaneous Q8H  . hydrALAZINE  100 mg Oral Q8H  . isosorbide dinitrate  30 mg Oral TID  . labetalol  100 mg Oral BID  . mouth rinse  15 mL Mouth Rinse BID  . multivitamin with minerals  1 tablet Oral Daily  . sodium bicarbonate  1,300 mg Oral TID  . sodium chloride flush  3 mL Intravenous Q12H   Continuous Infusions:  Time spent: 25 min   LOS: 5 days   Charlynne Cousins  [facility] Pager [phone]  *Please refer to [email or web address], password [email or web address] to get updated schedule on who will round on this patient, as hospitalists switch teams weekly. If 7PM-7AM, please contact night-coverage at [email or web address], password [email or web address] for any overnight needs.  09/20/2016, 8:15 AM

## 2016-09-21 LAB — RENAL FUNCTION PANEL
Albumin: 2.2 g/dL — ABNORMAL LOW (ref 3.5–5.0)
Anion gap: 11 (ref 5–15)
BUN: 96 mg/dL — ABNORMAL HIGH (ref 6–20)
CO2: 21 mmol/L — ABNORMAL LOW (ref 22–32)
Calcium: 9.1 mg/dL (ref 8.9–10.3)
Chloride: 110 mmol/L (ref 101–111)
Creatinine, Ser: 7.33 mg/dL — ABNORMAL HIGH (ref 0.44–1.00)
GFR calc Af Amer: 7 mL/min — ABNORMAL LOW (ref >60)
GFR calc non Af Amer: 6 mL/min — ABNORMAL LOW (ref >60)
Glucose, Bld: 103 mg/dL — ABNORMAL HIGH (ref 65–99)
Phosphorus: 7.6 mg/dL — ABNORMAL HIGH (ref 2.5–4.6)
Potassium: 3.4 mmol/L — ABNORMAL LOW (ref 3.5–5.1)
Sodium: 142 mmol/L (ref 135–145)

## 2016-09-21 MED ORDER — POTASSIUM CHLORIDE CRYS ER 20 MEQ PO TBCR
40.0000 meq | EXTENDED_RELEASE_TABLET | Freq: Once | ORAL | Status: AC
  Administered 2016-09-21: 40 meq via ORAL
  Filled 2016-09-21: qty 2

## 2016-09-21 NOTE — Progress Notes (Signed)
Pt refused trach care.

## 2016-09-21 NOTE — Progress Notes (Signed)
[facility] PROGRESS NOTE    Progress Note  Dianca Owensby  WUG:[id number] DOB: 1981-06-17 DOA: 09/15/2016 PCP: Wilfred Lacy, NP     Brief Narrative:   Zarie Kosiba is an 36 y.o. female presented with acute on chronic renal insufficiency possibly secondary to dehydration from poor oral intake and diarrhea and medications in the setting of a UTI.  Assessment/Plan:   Acute renal failure superimposed on stage 3chronic kidney disease - Patient presents with acute kidney injury in setting of known stage III chronic kidney disease (06/2016- Cr 1.1-1.6) - Unclear etiology, FENA greater than 1 point against prerenal state.  - per renal US, large kidneys. Creatinine continues to improve slowly, appreciate renal's assistance, continue to monitor urine output, which is picking up. We'll keep a close eye on urine output and creatinine and will have a low threshold to start IV fluid hydration as this may be ATN. - work up so far: ASO high normal, antiGBM negative, C3, C4 normal, SPEP c/w acute inflammatory response but no monoclonal; ANCA proteinase 3 upper limits normal, dsDNA upper limits normal; kappa and lambda LC's elevated with normal ratio; urine immunofix pending.  Hypokalemia - supplemented. - renal panel in AM  Anemia  - suspect related to acute illness vs CKD - Hemoglobin is stable.  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 Elsworth Soho on 1/29 to discuss tracheostomy revision/possible decannulation - Currently not hypoxic.  Morbid obesity  - Body mass index is 59.21 kg/m.  Thrombocytosis: Plt's are Slowly improving.  DVT prophylaxis: lovenox Family Communication:None Disposition Plan/Barrier to D/C: home in 2-3 days, was creatinine has improved significantly. Code Status:     Code Status Orders        Start     Ordered   09/15/16 2204  Full code  Continuous     09/15/16 2203    Code Status History    Date Active Date Inactive Code Status Order ID Comments User Context   06/29/2016  3:40 PM 06/29/2016  3:40 PM Full Code [id number]  Cathlyn Parsons, PA-C Inpatient   06/29/2016  3:40 PM 07/06/2016  4:15 PM Full Code [id number]  Cathlyn Parsons, PA-C Inpatient   05/25/2016  9:11 PM 06/29/2016  3:09 PM Full Code [id number]  Lily Kocher, MD Inpatient   05/26/2013 10:30 AM 05/28/2013  2:32 PM Full Code [id number]  Logan Bores, MD Inpatient        IV Access:    Peripheral IV   Procedures and diagnostic studies:   No results found.   Medical Consultants:    None.  Anti-Infectives:   none  Subjective:    Sueann Brownley she relates she feels great would like to go home.  Objective:    Vitals:   09/20/16 2314 09/21/16 0240 09/21/16 0457 09/21/16 0748  BP: (!) 156/81  (!) 144/73   Pulse: 88  91 90  Resp:   18 16  Temp:   98.4 F (36.9 C)   TempSrc:   Oral   SpO2:   100% 92%  Weight:  (!) 163.1 kg (359 lb 9.1 oz)    Height:        Intake/Output Summary (Last 24 hours) at 09/21/16 0752 Last data filed at 09/21/16 0500  Gross per 24 hour  Intake                0 ml  Output             1000 ml  Net            -1000 ml   Filed Weights   09/19/16 0500 09/20/16 0500 09/21/16 0240  Weight: (!) 155.7 kg (343 lb 4.1 oz) (!) 155 kg (341 lb 11.4 oz) (!) 163.1 kg (359 lb 9.1 oz)    Exam: General exam: In no acute distress. Respiratory system: Good air movement and clear  to auscultation. Cardiovascular system: S1 & S2 heard, RRR. No JVD, murmurs, rubs, gallops or clicks.  Gastrointestinal system: Abdomen is nondistended, soft and nontender.  Central nervous system: Alert and oriented. No focal neurological deficits. Extremities: No pedal edema. Skin: No rashes, lesions or ulcers Psychiatry: Judgement and insight appear normal. Mood & affect appropriate.    Data Reviewed:    Labs: Basic Metabolic Panel:  Recent Labs Lab 09/15/16 2232  09/17/16 2035 09/18/16 0551 09/19/16 0512 09/20/16 0604 09/21/16 5974  NA  --   < > 137  138 138  138 137 140 142  K  --   < > 3.4*  3.5 3.6  3.7 3.7 3.5 3.4*  CL  --   < > 103  104 104  104 107 109 110  CO2  --   < > 19*  19* 17*  17* 15* 19* 21*  GLUCOSE  --   < > 117*  115* 115*  114* 105* 101* 103*  BUN  --   < > 124*  123* 120*  119* 116* 104* 96*  CREATININE  --   < > 10.74*  10.71* 10.29*  10.40* 9.34* 8.51* 7.33*  CALCIUM  --   < > 8.7*  8.8* 8.7*  8.6* 8.6* 8.9 9.1  MG 2.2  --  2.1 2.2 2.1  --   --   PHOS 4.5  --  8.2* 8.7* 7.5* 7.8* 7.6*  < > = values in this interval not displayed. GFR Estimated Creatinine Clearance: 16.5 mL/min (by C-G formula based on SCr of 7.33 mg/dL (H)). Liver Function Tests:  Recent Labs Lab 09/15/16 0855 09/15/16 1154 09/16/16 0650 09/17/16 1638 09/18/16 0551 09/19/16 0512 09/20/16 0604 09/21/16 4536  AST _0 --   --   --   --   --   ALT _1 --   --   --   --   --   ALKPHOS 160* 151* 134*  --   --   --   --   --   BILITOT 1.4* 1.6* 1.1  --   --   --   --   --   PROT 8.5* 8.3* 7.4  --   --   --   --   --   ALBUMIN 3.2* 2.6* 2.0* 2.2* 2.1* 2.3* 2.3* 2.2*   No results for input(s): LIPASE, AMYLASE in the last 168 hours. No results for input(s): AMMONIA in the last 168 hours. Coagulation profile  Recent Labs Lab 09/18/16 1216  INR 1.27    CBC:  Recent Labs Lab 09/15/16 1154 09/16/16 0650 09/18/16 0551 09/19/16 0512  09/20/16  0604  WBC 13.7* 10.9* 9.1 8.7 8.4  HGB 11.6* 9.9* 9.8* 9.4* 9.1*  HCT 33.1* 29.8* 29.7* 28.4* 27.6*  MCV 88.7 90.0 91.4 91.0 91.7  PLT 355 364 469* 484* 469*   Cardiac Enzymes: No results for input(s): CKTOTAL, CKMB, CKMBINDEX, TROPONINI in the last 168 hours. BNP (last 3 results) No results for input(s): PROBNP in the last 8760 hours. CBG: No results for input(s): GLUCAP in the last 168 hours. D-Dimer: No results for input(s): DDIMER in the last 72 hours. Hgb A1c: No results for input(s): HGBA1C in the last 72 hours. Lipid Profile: No results for input(s): CHOL, HDL, LDLCALC, TRIG, CHOLHDL, LDLDIRECT in the last 72 hours. Thyroid function studies: No results for input(s): TSH, T4TOTAL, T3FREE, THYROIDAB in the last 72 hours.  Invalid input(s): FREET3 Anemia work up:  Recent Labs  09/19/16 0512  TIBC 227*  IRON 54   Sepsis Labs:  Recent Labs Lab 09/16/16 0650 09/18/16 0551 09/19/16 0512 09/20/16 0604  WBC 10.9* 9.1 8.7 8.4   Microbiology Recent Results (from the past 240 hour(s))  Urine culture     Status: Abnormal   Collection Time: 09/15/16  1:17 PM  Result Value Ref Range Status   Specimen Description URINE, CLEAN CATCH  Final   Special Requests NONE  Final   Culture MULTIPLE SPECIES PRESENT, SUGGEST RECOLLECTION (A)  Final   Report Status 09/16/2016 FINAL  Final  Gastrointestinal Panel by PCR , Stool     Status: None   Collection Time: 09/15/16  4:15 PM  Result Value Ref Range Status   Campylobacter species NOT DETECTED NOT DETECTED Final   Plesimonas shigelloides NOT DETECTED NOT DETECTED Final   Salmonella species NOT DETECTED NOT DETECTED Final   Yersinia enterocolitica NOT DETECTED NOT DETECTED Final   Vibrio species NOT DETECTED NOT DETECTED Final   Vibrio cholerae NOT DETECTED NOT DETECTED Final   Enteroaggregative E coli (EAEC) NOT DETECTED NOT DETECTED Final   Enteropathogenic E coli (EPEC) NOT DETECTED NOT DETECTED Final    Enterotoxigenic E coli (ETEC) NOT DETECTED NOT DETECTED Final   Shiga like toxin producing E coli (STEC) NOT DETECTED NOT DETECTED Final   Shigella/Enteroinvasive E coli (EIEC) NOT DETECTED NOT DETECTED Final   Cryptosporidium NOT DETECTED NOT DETECTED Final   Cyclospora cayetanensis NOT DETECTED NOT DETECTED Final   Entamoeba histolytica NOT DETECTED NOT DETECTED Final   Giardia lamblia NOT DETECTED NOT DETECTED Final   Adenovirus F40/41 NOT DETECTED NOT DETECTED Final   Astrovirus NOT DETECTED NOT DETECTED Final   Norovirus GI/GII NOT DETECTED NOT DETECTED Final   Rotavirus A NOT DETECTED NOT DETECTED Final   Sapovirus (I, II, IV, and V) NOT DETECTED NOT DETECTED Final  Culture, blood (Routine X 2) w Reflex to ID Panel     Status: None   Collection Time: 09/15/16  5:10 PM  Result Value Ref Range Status   Specimen Description BLOOD LEFT ANTECUBITAL  Final   Special Requests BOTTLES DRAWN AEROBIC AND ANAEROBIC 5CC  Final   Culture NO GROWTH 5 DAYS  Final   Report Status 09/20/2016 FINAL  Final  Respiratory Panel by PCR     Status: None   Collection Time: 09/15/16  5:58 PM  Result Value Ref Range Status   Adenovirus NOT DETECTED NOT DETECTED Final   Coronavirus 229E NOT DETECTED NOT DETECTED Final   Coronavirus HKU1 NOT DETECTED NOT DETECTED Final   Coronavirus NL63 NOT DETECTED NOT DETECTED Final   Coronavirus OC43 NOT  DETECTED NOT DETECTED Final   Metapneumovirus NOT DETECTED NOT DETECTED Final   Rhinovirus / Enterovirus NOT DETECTED NOT DETECTED Final   Influenza A NOT DETECTED NOT DETECTED Final   Influenza B NOT DETECTED NOT DETECTED Final   Parainfluenza Virus 1 NOT DETECTED NOT DETECTED Final   Parainfluenza Virus 2 NOT DETECTED NOT DETECTED Final   Parainfluenza Virus 3 NOT DETECTED NOT DETECTED Final   Parainfluenza Virus 4 NOT DETECTED NOT DETECTED Final   Respiratory Syncytial Virus NOT DETECTED NOT DETECTED Final   Bordetella pertussis NOT DETECTED NOT DETECTED Final    Chlamydophila pneumoniae NOT DETECTED NOT DETECTED Final   Mycoplasma pneumoniae NOT DETECTED NOT DETECTED Final  Culture, blood (Routine X 2) w Reflex to ID Panel     Status: None   Collection Time: 09/15/16  8:29 PM  Result Value Ref Range Status   Specimen Description BLOOD LEFT HAND  Final   Special Requests IN PEDIATRIC BOTTLE 1CC  Final   Culture NO GROWTH 5 DAYS  Final   Report Status 09/20/2016 FINAL  Final     Medications:   . heparin  5,000 Units Subcutaneous Q8H  . hydrALAZINE  100 mg Oral Q8H  . isosorbide dinitrate  30 mg Oral TID  . labetalol  100 mg Oral BID  . mouth rinse  15 mL Mouth Rinse BID  . multivitamin with minerals  1 tablet Oral Daily  . sodium bicarbonate  1,300 mg Oral TID  . sodium chloride flush  3 mL Intravenous Q12H   Continuous Infusions:  Time spent: 25 min   LOS: 6 days   Charlynne Cousins  [facility] Pager [phone]  *Please refer to [email or web address], password [email or web address] to get updated schedule on who will round on this patient, as hospitalists switch teams weekly. If 7PM-7AM, please contact night-coverage at [email or web address], password [email or web address] for any overnight needs.  09/21/2016, 7:52 AM

## 2016-09-21 NOTE — Progress Notes (Signed)
Subjective: Interval History: has complaints wants to go home.  Objective: Vital signs in last 24 hours: Temp:  [97.7 F (36.5 C)-98.8 F (37.1 C)] 98.4 F (36.9 C) (01/30 0457) Pulse Rate:  [83-91] 85 (01/30 0922) Resp:  [16-18] 16 (01/30 0748) BP: (114-156)/(56-81) 153/79 (01/30 0922) SpO2:  [92 %-100 %] 92 % (01/30 0748) FiO2 (%):  [30 %] 30 % (01/30 0748) Weight:  [163.1 kg (359 lb 9.1 oz)] 163.1 kg (359 lb 9.1 oz) (01/30 0240) Weight change: 8.1 kg (17 lb 13.7 oz)  Intake/Output from previous day: 01/29 0701 - 01/30 0700 In: -  Out: 1000 [Urine:1000] Intake/Output this shift: No intake/output data recorded.  General appearance: alert, cooperative, no distress and morbidly obese Neck: trach Resp: diminished breath sounds bilaterally Cardio: S1, S2 normal GI: obese, pos bs, soft Extremities: edema 1+  Lab Results:  Recent Labs  09/19/16 0512 09/20/16 0604  WBC 8.7 8.4  HGB 9.4* 9.1*  HCT 28.4* 27.6*  PLT 484* 469*   BMET:  Recent Labs  09/20/16 0604 09/21/16 0608  NA 140 142  K 3.5 3.4*  CL 109 110  CO2 19* 21*  GLUCOSE 101* 103*  BUN 104* 96*  CREATININE 8.51* 7.33*  CALCIUM 8.9 9.1   No results for input(s): PTH in the last 72 hours. Iron Studies:  Recent Labs  09/19/16 0512  IRON 54  TIBC 227*    Studies/Results: No results found.  I have reviewed the patient's current medications.  Assessment/Plan: 1 AKI slowly better.  Acidemia better with po bicarb.  K mildly low follow.  Good urine vol 2 massive obesity 3 OSA with trach 4 HTN rising at this time 5 anemia Fe ok P follow chem, urine vol. Would like Cr <5 befor d/c    LOS: 6 days   Brittni Hult L 09/21/2016,10:09 AM

## 2016-09-22 DIAGNOSIS — N179 Acute kidney failure, unspecified: Principal | ICD-10-CM

## 2016-09-22 DIAGNOSIS — N183 Chronic kidney disease, stage 3 (moderate): Secondary | ICD-10-CM

## 2016-09-22 DIAGNOSIS — E1165 Type 2 diabetes mellitus with hyperglycemia: Secondary | ICD-10-CM

## 2016-09-22 DIAGNOSIS — E118 Type 2 diabetes mellitus with unspecified complications: Secondary | ICD-10-CM

## 2016-09-22 LAB — RENAL FUNCTION PANEL
Albumin: 2.2 g/dL — ABNORMAL LOW (ref 3.5–5.0)
Anion gap: 10 (ref 5–15)
BUN: 89 mg/dL — ABNORMAL HIGH (ref 6–20)
CO2: 21 mmol/L — ABNORMAL LOW (ref 22–32)
Calcium: 9 mg/dL (ref 8.9–10.3)
Chloride: 109 mmol/L (ref 101–111)
Creatinine, Ser: 6.17 mg/dL — ABNORMAL HIGH (ref 0.44–1.00)
GFR calc Af Amer: 9 mL/min — ABNORMAL LOW (ref >60)
GFR calc non Af Amer: 8 mL/min — ABNORMAL LOW (ref >60)
Glucose, Bld: 89 mg/dL (ref 65–99)
Phosphorus: 6.1 mg/dL — ABNORMAL HIGH (ref 2.5–4.6)
Potassium: 4 mmol/L (ref 3.5–5.1)
Sodium: 140 mmol/L (ref 135–145)

## 2016-09-22 NOTE — Progress Notes (Signed)
[facility] PROGRESS NOTE    Progress Note  Nhyira Leano  OJZ:[id number] DOB: 1981/01/20 DOA: 09/15/2016 PCP: Wilfred Lacy, NP     Brief Narrative:   [name] [name] is an 36 y.o. female presented with acute on chronic renal insufficiency possibly secondary to dehydration from poor oral intake and diarrhea and medications in the setting of a UTI.  Assessment/Plan:   Acute renal failure superimposed on stage 3chronic kidney disease - Patient presents with acute kidney injury in setting of known stage III chronic kidney disease (06/2016- Cr 1.1-1.6) - Unclear etiology, FENA greater than 1 point against prerenal state.  - per renal US, large kidneys. Creatinine continues to improve slowly, appreciate renal's assistance, continue to monitor urine output, which is picking up. We'll keep a close eye on urine output and creatinine and will have a low threshold to start IV fluid hydration as this may be ATN. - work up so far: ASO high normal, antiGBM negative, C3, C4 normal, SPEP c/w acute inflammatory response but no monoclonal; ANCA proteinase 3 upper limits normal, dsDNA upper limits normal; kappa and lambda LC's elevated with normal ratio; urine immunofix pending. - Nephrology on board and managing.  Hypokalemia - supplemented and resolved  Anemia  - suspect related to acute illness vs CKD - Hemoglobin is stable.  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 Elsworth Soho on 1/29 to discuss tracheostomy revision/possible decannulation - Currently not hypoxic.  Morbid obesity  - Body mass index is 59.21 kg/m.  Thrombocytosis: Plt's are Slowly improving.  DVT prophylaxis: lovenox Family Communication:None Disposition Plan/Barrier to D/C: home in 2-3 days, was creatinine has improved significantly. Code Status:     Code Status Orders        Start     Ordered   09/15/16 2204  Full code  Continuous     09/15/16 2203    Code Status History    Date Active Date Inactive Code Status Order ID Comments User Context   06/29/2016  3:40 PM 06/29/2016  3:40 PM Full Code [id number]  Cathlyn Parsons, PA-C Inpatient   06/29/2016  3:40 PM 07/06/2016  4:15 PM Full Code [id number]  Cathlyn Parsons, PA-C Inpatient   05/25/2016  9:11 PM 06/29/2016  3:09 PM Full Code [id number]  Lily Kocher, MD Inpatient   05/26/2013 10:30 AM 05/28/2013  2:32 PM Full Code [id number]  Logan Bores, MD Inpatient        IV Access:    Peripheral IV   Procedures and diagnostic studies:   No results found.   Medical Consultants:    None.  Anti-Infectives:   none  Subjective:    [name] [name] she relates she feels great would like to go home.  Objective:    Vitals:   09/22/16 0913 09/22/16 1116 09/22/16 1152 09/22/16 1328  BP:  (!) 149/72  135/77  Pulse: 83 82 92 79  Resp: _0 Temp:    97.8 F (36.6 C)  TempSrc:      SpO2: 95%  90% 90%  Weight:      Height:        Intake/Output Summary (Last 24 hours) at 09/22/16 1627 Last data filed at 09/22/16 0955  Gross per 24 hour  Intake              480 ml  Output              400 ml  Net               80 ml   Filed Weights   09/20/16 0500 09/21/16 0240 09/22/16 0558  Weight: (!) 155 kg (341 lb 11.4 oz) (!) 163.1 kg (359 lb 9.1 oz) (!) 160.6 kg (354 lb 0.9 oz)    Exam: General exam: In no acute distress Respiratory system: Good air movement and  clear to auscultation. Cardiovascular system: S1 & S2 heard, RRR. No JVD, murmurs, rubs, gallops or clicks.  Gastrointestinal system: Abdomen is nondistended, soft and nontender.  Central nervous system: Alert and oriented. No focal neurological deficits. Extremities: No pedal edema. Skin: No rashes, lesions or ulcers Psychiatry: Judgement and insight appear normal. Mood & affect appropriate.    Data Reviewed:    Labs: Basic Metabolic Panel:  Recent Labs Lab 09/15/16 2232  09/17/16 0865 09/18/16 7846 09/19/16 9629 09/20/16 0604 09/21/16 5284 09/22/16 0657  NA  --   < > 137  138 138  138 137 140 142 140  K  --   < > 3.4*  3.5 3.6  3.7 3.7 3.5 3.4* 4.0  CL  --   < > 103  104 104  104 107 109 110 109  CO2  --   < > 19*  19* 17*  17* 15* 19* 21* 21*  GLUCOSE  --   < > 117*  115* 115*  114* 105* 101* 103* 89  BUN  --   < > 124*  123* 120*  119* 116* 104* 96* 89*  CREATININE  --   < > 10.74*  10.71* 10.29*  10.40* 9.34* 8.51* 7.33* 6.17*  CALCIUM  --   < > 8.7*  8.8* 8.7*  8.6* 8.6* 8.9 9.1 9.0  MG 2.2  --  2.1 2.2 2.1  --   --   --   PHOS 4.5  --  8.2* 8.7* 7.5* 7.8* 7.6* 6.1*  < > = values in this interval not displayed. GFR Estimated Creatinine Clearance: 19.4 mL/min (by C-G formula based on SCr of 6.17 mg/dL (H)). Liver Function Tests:  Recent Labs Lab 09/16/16 0650  09/18/16 0551 09/19/16 0512 09/20/16 0604 09/21/16 1324 09/22/16 0657  AST 18  --   --   --   --   --   --   ALT 16  --   --   --   --   --   --   ALKPHOS 134*  --   --   --   --   --   --   BILITOT 1.1  --   --   --   --   --   --   PROT 7.4  --   --   --   --   --   --   ALBUMIN 2.0*  < > 2.1* 2.3* 2.3* 2.2* 2.2*  < > = values in this interval not displayed. No results for input(s): LIPASE, AMYLASE in the last 168 hours. No results for input(s): AMMONIA in the last 168 hours. Coagulation profile  Recent Labs  Lab 09/18/16 1216  INR 1.27    CBC:  Recent Labs Lab  09/16/16 0650 09/18/16 0551 09/19/16 0512 09/20/16 0604  WBC 10.9* 9.1 8.7 8.4  HGB 9.9* 9.8* 9.4* 9.1*  HCT 29.8* 29.7* 28.4* 27.6*  MCV 90.0 91.4 91.0 91.7  PLT 364 469* 484* 469*   Cardiac Enzymes: No results for input(s): CKTOTAL, CKMB, CKMBINDEX, TROPONINI in the last 168 hours. BNP (last 3 results) No results for input(s): PROBNP in the last 8760 hours. CBG: No results for input(s): GLUCAP in the last 168 hours. D-Dimer: No results for input(s): DDIMER in the last 72 hours. Hgb A1c: No results for input(s): HGBA1C in the last 72 hours. Lipid Profile: No results for input(s): CHOL, HDL, LDLCALC, TRIG, CHOLHDL, LDLDIRECT in the last 72 hours. Thyroid function studies: No results for input(s): TSH, T4TOTAL, T3FREE, THYROIDAB in the last 72 hours.  Invalid input(s): FREET3 Anemia work up: No results for input(s): VITAMINB12, FOLATE, FERRITIN, TIBC, IRON, RETICCTPCT in the last 72 hours. Sepsis Labs:  Recent Labs Lab 09/16/16 0650 09/18/16 0551 09/19/16 0512 09/20/16 0604  WBC 10.9* 9.1 8.7 8.4   Microbiology Recent Results (from the past 240 hour(s))  Urine culture     Status: Abnormal   Collection Time: 09/15/16  1:17 PM  Result Value Ref Range Status   Specimen Description URINE, CLEAN CATCH  Final   Special Requests NONE  Final   Culture MULTIPLE SPECIES PRESENT, SUGGEST RECOLLECTION (A)  Final   Report Status 09/16/2016 FINAL  Final  Gastrointestinal Panel by PCR , Stool     Status: None   Collection Time: 09/15/16  4:15 PM  Result Value Ref Range Status   Campylobacter species NOT DETECTED NOT DETECTED Final   Plesimonas shigelloides NOT DETECTED NOT DETECTED Final   Salmonella species NOT DETECTED NOT DETECTED Final   Yersinia enterocolitica NOT DETECTED NOT DETECTED Final   Vibrio species NOT DETECTED NOT DETECTED Final   Vibrio cholerae NOT DETECTED NOT DETECTED Final   Enteroaggregative E coli (EAEC) NOT DETECTED NOT DETECTED Final    Enteropathogenic E coli (EPEC) NOT DETECTED NOT DETECTED Final   Enterotoxigenic E coli (ETEC) NOT DETECTED NOT DETECTED Final   Shiga like toxin producing E coli (STEC) NOT DETECTED NOT DETECTED Final   Shigella/Enteroinvasive E coli (EIEC) NOT DETECTED NOT DETECTED Final   Cryptosporidium NOT DETECTED NOT DETECTED Final   Cyclospora cayetanensis NOT DETECTED NOT DETECTED Final   Entamoeba histolytica NOT DETECTED NOT DETECTED Final   Giardia lamblia NOT DETECTED NOT DETECTED Final   Adenovirus F40/41 NOT DETECTED NOT DETECTED Final   Astrovirus NOT DETECTED NOT DETECTED Final   Norovirus GI/GII NOT DETECTED NOT DETECTED Final   Rotavirus A NOT DETECTED NOT DETECTED Final   Sapovirus (I, II, IV, and V) NOT DETECTED NOT DETECTED Final  Culture, blood (Routine X 2) w Reflex to ID Panel     Status: None   Collection Time: 09/15/16  5:10 PM  Result Value Ref Range Status   Specimen Description BLOOD LEFT ANTECUBITAL  Final   Special Requests BOTTLES DRAWN AEROBIC AND ANAEROBIC 5CC  Final   Culture NO GROWTH 5 DAYS  Final   Report Status 09/20/2016 FINAL  Final  Respiratory Panel by PCR     Status: None   Collection Time: 09/15/16  5:58 PM  Result Value Ref Range Status   Adenovirus NOT DETECTED NOT DETECTED Final   Coronavirus 229E NOT DETECTED NOT DETECTED Final   Coronavirus HKU1 NOT  DETECTED NOT DETECTED Final   Coronavirus NL63 NOT DETECTED NOT DETECTED Final   Coronavirus OC43 NOT DETECTED NOT DETECTED Final   Metapneumovirus NOT DETECTED NOT DETECTED Final   Rhinovirus / Enterovirus NOT DETECTED NOT DETECTED Final   Influenza A NOT DETECTED NOT DETECTED Final   Influenza B NOT DETECTED NOT DETECTED Final   Parainfluenza Virus 1 NOT DETECTED NOT DETECTED Final   Parainfluenza Virus 2 NOT DETECTED NOT DETECTED Final   Parainfluenza Virus 3 NOT DETECTED NOT DETECTED Final   Parainfluenza Virus 4 NOT DETECTED NOT DETECTED Final   Respiratory Syncytial Virus NOT DETECTED NOT  DETECTED Final   Bordetella pertussis NOT DETECTED NOT DETECTED Final   Chlamydophila pneumoniae NOT DETECTED NOT DETECTED Final   Mycoplasma pneumoniae NOT DETECTED NOT DETECTED Final  Culture, blood (Routine X 2) w Reflex to ID Panel     Status: None   Collection Time: 09/15/16  8:29 PM  Result Value Ref Range Status   Specimen Description BLOOD LEFT HAND  Final   Special Requests IN PEDIATRIC BOTTLE 1CC  Final   Culture NO GROWTH 5 DAYS  Final   Report Status 09/20/2016 FINAL  Final     Medications:   . heparin  5,000 Units Subcutaneous Q8H  . hydrALAZINE  100 mg Oral Q8H  . isosorbide dinitrate  30 mg Oral TID  . labetalol  100 mg Oral BID  . mouth rinse  15 mL Mouth Rinse BID  . multivitamin with minerals  1 tablet Oral Daily  . sodium bicarbonate  1,300 mg Oral TID  . sodium chloride flush  3 mL Intravenous Q12H   Continuous Infusions:  Time spent: 35 min   LOS: 7 days   Velvet Bathe  [facility] Pager [phone]  *Please refer to [email or web address], password [email or web address] to get updated schedule on who will round on this patient, as hospitalists switch teams weekly. If 7PM-7AM, please contact night-coverage at [email or web address], password [email or web address] for any overnight needs.  09/22/2016, 4:27 PM

## 2016-09-22 NOTE — Progress Notes (Signed)
Subjective: Interval History: has complaints wants to go home.  Objective: Vital signs in last 24 hours: Temp:  [97.9 F (36.6 C)-98.3 F (36.8 C)] 98.3 F (36.8 C) (01/31 0558) Pulse Rate:  [83-91] 83 (01/31 0913) Resp:  [16-19] 18 (01/31 0913) BP: (119-154)/(60-81) 153/81 (01/31 0558) SpO2:  [92 %-100 %] 95 % (01/31 0913) FiO2 (%):  [30 %] 30 % (01/31 0913) Weight:  [160.6 kg (354 lb 0.9 oz)] 160.6 kg (354 lb 0.9 oz) (01/31 0558) Weight change: -2.5 kg (-5 lb 8.2 oz)  Intake/Output from previous day: 01/30 0701 - 01/31 0700 In: -  Out: 400 [Urine:400] Intake/Output this shift: Total I/O In: 480 [P.O.:480] Out: -   General appearance: alert, cooperative, no distress, morbidly obese and pale Neck: Trach Resp: decreased bs Cardio: S1, S2 normal and systolic murmur: holosystolic 2/6, blowing at apex GI: obese, pos bs, soft Extremities: edema 1+  Lab Results:  Recent Labs  09/20/16 0604  WBC 8.4  HGB 9.1*  HCT 27.6*  PLT 469*   BMET:  Recent Labs  09/21/16 0608 09/22/16 0657  NA 142 140  K 3.4* 4.0  CL 110 109  CO2 21* 21*  GLUCOSE 103* 89  BUN 96* 89*  CREATININE 7.33* 6.17*  CALCIUM 9.1 9.0   No results for input(s): PTH in the last 72 hours. Iron Studies: No results for input(s): IRON, TIBC, TRANSFERRIN, FERRITIN in the last 72 hours.  Studies/Results: No results found.  I have reviewed the patient's current medications.  Assessment/Plan: 1 AKI improving, ??output.  Vol ok on exam , acid/base improving.  Hopefully d/c in 24 h to outpatient f/u 2 Massive obesity 3 HTn not an issue now 4 OSA 5 Anemia should recover P follow Cr, cont diet    LOS: 7 days   Kyleigh Nannini L 09/22/2016,10:07 AM

## 2016-09-23 LAB — RENAL FUNCTION PANEL
Albumin: 2.3 g/dL — ABNORMAL LOW (ref 3.5–5.0)
Anion gap: 9 (ref 5–15)
BUN: 78 mg/dL — ABNORMAL HIGH (ref 6–20)
CO2: 24 mmol/L (ref 22–32)
Calcium: 9 mg/dL (ref 8.9–10.3)
Chloride: 109 mmol/L (ref 101–111)
Creatinine, Ser: 5.29 mg/dL — ABNORMAL HIGH (ref 0.44–1.00)
GFR calc Af Amer: 11 mL/min — ABNORMAL LOW (ref >60)
GFR calc non Af Amer: 9 mL/min — ABNORMAL LOW (ref >60)
Glucose, Bld: 91 mg/dL (ref 65–99)
Phosphorus: 5.4 mg/dL — ABNORMAL HIGH (ref 2.5–4.6)
Potassium: 3.8 mmol/L (ref 3.5–5.1)
Sodium: 142 mmol/L (ref 135–145)

## 2016-09-23 LAB — GLUCOSE, CAPILLARY
Glucose-Capillary: 95 mg/dL (ref 65–99)
Glucose-Capillary: 112 mg/dL — ABNORMAL HIGH (ref 65–99)

## 2016-09-23 MED ORDER — LABETALOL HCL 100 MG PO TABS: 100.0000 mg | ORAL_TABLET | Freq: Two times a day (BID) | ORAL | 0 refills | Status: DC

## 2016-09-23 NOTE — Progress Notes (Signed)
Subjective: Interval History: has no complaint, except wants to go home.  Objective: Vital signs in last 24 hours: Temp:  [97.8 F (36.6 C)-98.9 F (37.2 C)] 98.7 F (37.1 C) (02/01 0604) Pulse Rate:  [79-92] 87 (02/01 0901) Resp:  [16-18] 18 (02/01 0901) BP: (128-149)/(64-77) 133/66 (02/01 0604) SpO2:  [90 %-100 %] 93 % (02/01 0901) FiO2 (%):  [21 %-28 %] 28 % (02/01 0901) Weight change:   Intake/Output from previous day: 01/31 0701 - 02/01 0700 In: 480 [P.O.:480] Out: -  Intake/Output this shift: No intake/output data recorded.  General appearance: alert, cooperative, no distress and morbidly obese Neck: trach Resp: diminished breath sounds bilaterally Cardio: S1, S2 normal and systolic murmur: holosystolic 2/6, blowing at apex GI: massive , pos bs Extremities: massive, pos bs  extrem tr edema  Lab Results: No results for input(s): WBC, HGB, HCT, PLT in the last 72 hours. BMET:  Recent Labs  09/22/16 0657 09/23/16 0408  NA 140 142  K 4.0 3.8  CL 109 109  CO2 21* 24  GLUCOSE 89 91  BUN 89* 78*  CREATININE 6.17* 5.29*  CALCIUM 9.0 9.0   No results for input(s): PTH in the last 72 hours. Iron Studies: No results for input(s): IRON, TIBC, TRANSFERRIN, FERRITIN in the last 72 hours.  Studies/Results: No results found.  I have reviewed the patient's current medications.  Assessment/Plan: 1 AKI improving GFR.  Acid/base ok, can stop po bicarb.  Vol ok. With rate or improvement, can d/c home and get chem on Mon for f/u and f/u in our office in 10d 2 massive obesity needs plan 3 HTN on bp meds, controlled 4 Anemia should get better P follow chem , ok to d/c    LOS: 8 days   Emil Klassen L 09/23/2016,9:56 AM

## 2016-09-23 NOTE — Discharge Summary (Signed)
Physician Discharge Summary  [name] [name] WGN:[id number] DOB: [birth year] DOA: 09/15/2016  PCP: Wilfred Lacy, NP  Admit date: 09/15/2016 Discharge date: 09/23/2016  Time spent: > 35  minutes  Recommendations for Outpatient Follow-up:  1. Monitor serum creatinine 2. Monitor blood pressures and adjust accordingly. 3. Decide whether or not to continue lasix with improvement in renal fucntion   Discharge Diagnoses:  Principal Problem:   Acute renal failure superimposed on stage 3 chronic kidney disease (HCC) Active Problems:   Malignant hypertension   Morbid obesity (HCC)   OSA (obstructive sleep apnea)   Pulmonary hypertension   Chronic respiratory failure with hypercapnia (HCC)   Tracheostomy status (HCC)   Chronic diastolic heart failure (HCC)   Obesity hypoventilation syndrome (HCC)   Pneumonitis   Diarrhea   Abnormal urinalysis   Acute renal failure ([place])   Lower urinary tract infectious disease   Discharge Condition: stable  Diet recommendation: renal diet  Filed Weights   09/20/16 0500 09/21/16 0240 09/22/16 0558  Weight: (!) 155 kg (341 lb 11.4 oz) (!) 163.1 kg (359 lb 9.1 oz) (!) 160.6 kg (354 lb 0.9 oz)    History of present illness:  [name] [name] is an 36 y.o. female presented with acute on chronic renal insufficiency possibly secondary to dehydration from poor oral intake and diarrhea and medications in the setting of a UTI.  Hospital Course:  Acute renal failure superimposed on stage 3chronic kidney disease - Patient presents with acute kidney injury in setting of known stage III chronic kidney disease (06/2016- Cr 1.1-1.6) - Unclear etiology, FENA greater than 1 point against prerenal state.  - per renal US, large kidneys. Creatinine continues to improve slowly, appreciate renal's assistance, continue to monitor urine output, which is picking up. We'll keep a close eye on urine output and creatinine and will have a low threshold to start IV fluid hydration  as this may be ATN. - work up so far: ASO high normal, antiGBM negative, C3, C4 normal, SPEP c/w acute inflammatory response but no monoclonal; ANCA proteinase 3 upper limits normal, dsDNA upper limits normal; kappa and lambda LC's elevated with normal ratio; urine immunofix pending. - Pt to f/u with nephrologist 1 week after d/c  Hypokalemia - resolved after supplementation  Anemia  - suspect related to acute illness vs CKD - Hemoglobin is stable.  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 Elsworth Soho on 1/29 to discuss tracheostomy revision/possible decannulation  Morbid obesity  - Body mass index is 59.21 kg/m.  Thrombocytosis: Stable  Procedures:  None  Consultations:  Nephrology: Dr. Deterdine  Discharge Exam: Vitals:   09/23/16 1041 09/23/16 1109  BP: 140/70   Pulse: 85 85  Resp:  18  Temp:      General: Pt in nad, alert and awake Cardiovascular: rrr, no rubs Respiratory: no increased wob, no wheezes  Discharge Instructions   Discharge Instructions    Call MD for:  severe uncontrolled pain    Complete by:  As directed    Call MD for:  temperature >100.4  Complete by:  As directed    Diet - low sodium heart healthy    Complete by:  As directed    Discharge instructions    Complete by:  As directed    These follow-up with your nephrologist or primary care physician after hospital discharge. He  should have your serum creatinine rechecked within the next week   Increase activity slowly    Complete by:  As directed      Current Discharge Medication List    CONTINUE these medications which have CHANGED   Details  labetalol (NORMODYNE) 100 MG tablet Take 1 tablet (100 mg total) by mouth 2 (two) times daily. Qty: 60 tablet, Refills: 0      CONTINUE these medications which have NOT CHANGED   Details  hydrALAZINE (APRESOLINE) 100 MG tablet Take 1 tablet (100 mg total) by mouth 3 (three) times daily. Qty: 90 tablet, Refills: 4   Associated Diagnoses: Benign essential HTN    isosorbide dinitrate (ISORDIL) 30 MG tablet Take 1 tablet (30 mg total) by mouth 3 (three) times daily. Qty: 90 tablet, Refills: 4   Associated Diagnoses: Chronic diastolic congestive heart failure ([place]); Benign essential HTN    Multiple Vitamin (MULTIVITAMIN WITH MINERALS) TABS tablet Take 1 tablet by mouth daily. Qty: 30 tablet, Refills: 0    famotidine (PEPCID) 20 MG tablet Take 1 tablet (20 mg total) by mouth 2 (two) times daily. Qty: 60 tablet, Refills: 3      STOP taking these medications     furosemide (LASIX) 80 MG tablet      potassium chloride SA (K-DUR,KLOR-CON) 20 MEQ tablet        Allergies  Allergen Reactions  . No Known Allergies    Follow-up Information    [facility]. Go on 09/24/2016.   Why:  Post hospital follow up scheduled 09/24/2016 at 1:30 pm with Dr.Amao Contact information: [place] [phone]       [facility]. Go on 09/27/2016.   Why:  [place] ON 09/27/2016 WITH CHARLOTTE NCHE NP AT 11 AM       [facility]. Go on 10/20/2016.   Why:  Follw up appointment at the trach clinic is scheduled on 10/20/2016 with [name] NP           The results of significant diagnostics from this hospitalization (including imaging, microbiology, ancillary and  laboratory) are listed below for reference.    Significant Diagnostic Studies: Dg Chest 2 View  Result Date: 09/15/2016 CLINICAL DATA:  URI. EXAM: CHEST  2 VIEW COMPARISON:  06/28/2016 . FINDINGS: Tracheostomy tube noted in good anatomic position. Cardiomegaly with mild pulmonary vascular prominence and bilateral interstitial prominence consistent congestive heart failure. Interstitial pneumonitis cannot be excluded. No pleural effusion or pneumothorax. IMPRESSION: 1. Tracheostomy tube noted good anatomic position. 2. Cardiomegaly with mild pulmonary vascular prominence and bilateral interstitial prominence consistent with CHF. Interstitial pneumonitis cannot be excluded . Electronically Signed   By: Marcello Moores  Register   On: 09/15/2016 13:57   US Renal  Result Date: 09/15/2016 CLINICAL DATA:  Acute kidney injury.  UTI.  Hypertension.  Diabetes. EXAM: RENAL / URINARY TRACT ULTRASOUND COMPLETE COMPARISON:  None. FINDINGS: Right Kidney: Length: 14.9 cm. Prominent cortical thickness. This could be secondary to diabetic glomerulonephropathy or acute nephritis. No hydronephrosis. No focal lesion. Left Kidney: Length: 13.3 cm. Prominent cortical thickness. This could be secondary to diabetic glomerulonephropathy or acute nephritis.  No hydronephrosis. No focal lesion. Bladder: Appears normal for degree of bladder distention. IMPRESSION: Bilateral renal enlargement, more on the right than the left. Prominent cortical thickness. This could be secondary to diabetic glomerulonephropathy or acute nephritis. Electronically Signed   By: Nelson Chimes M.D.   On: 09/15/2016 16:40    Microbiology: Recent Results (from the past 240 hour(s))  Urine culture     Status: Abnormal   Collection Time: 09/15/16  1:17 PM  Result Value Ref Range Status   Specimen Description URINE, CLEAN CATCH  Final   Special Requests NONE  Final   Culture MULTIPLE SPECIES PRESENT, SUGGEST RECOLLECTION (A)  Final   Report Status 09/16/2016  FINAL  Final  Gastrointestinal Panel by PCR , Stool     Status: None   Collection Time: 09/15/16  4:15 PM  Result Value Ref Range Status   Campylobacter species NOT DETECTED NOT DETECTED Final   Plesimonas shigelloides NOT DETECTED NOT DETECTED Final   Salmonella species NOT DETECTED NOT DETECTED Final   Yersinia enterocolitica NOT DETECTED NOT DETECTED Final   Vibrio species NOT DETECTED NOT DETECTED Final   Vibrio cholerae NOT DETECTED NOT DETECTED Final   Enteroaggregative E coli (EAEC) NOT DETECTED NOT DETECTED Final   Enteropathogenic E coli (EPEC) NOT DETECTED NOT DETECTED Final   Enterotoxigenic E coli (ETEC) NOT DETECTED NOT DETECTED Final   Shiga like toxin producing E coli (STEC) NOT DETECTED NOT DETECTED Final   Shigella/Enteroinvasive E coli (EIEC) NOT DETECTED NOT DETECTED Final   Cryptosporidium NOT DETECTED NOT DETECTED Final   Cyclospora cayetanensis NOT DETECTED NOT DETECTED Final   Entamoeba histolytica NOT DETECTED NOT DETECTED Final   Giardia lamblia NOT DETECTED NOT DETECTED Final   Adenovirus F40/41 NOT DETECTED NOT DETECTED Final   Astrovirus NOT DETECTED NOT DETECTED Final   Norovirus GI/GII NOT DETECTED NOT DETECTED Final   Rotavirus A NOT DETECTED NOT DETECTED Final   Sapovirus (I, II, IV, and V) NOT DETECTED NOT DETECTED Final  Culture, blood (Routine X 2) w Reflex to ID Panel     Status: None   Collection Time: 09/15/16  5:10 PM  Result Value Ref Range Status   Specimen Description BLOOD LEFT ANTECUBITAL  Final   Special Requests BOTTLES DRAWN AEROBIC AND ANAEROBIC 5CC  Final   Culture NO GROWTH 5 DAYS  Final   Report Status 09/20/2016 FINAL  Final  Respiratory Panel by PCR     Status: None   Collection Time: 09/15/16  5:58 PM  Result Value Ref Range Status   Adenovirus NOT DETECTED NOT DETECTED Final   Coronavirus 229E NOT DETECTED NOT DETECTED Final   Coronavirus HKU1 NOT DETECTED NOT DETECTED Final   Coronavirus NL63 NOT DETECTED NOT DETECTED Final    Coronavirus OC43 NOT DETECTED NOT DETECTED Final   Metapneumovirus NOT DETECTED NOT DETECTED Final   Rhinovirus / Enterovirus NOT DETECTED NOT DETECTED Final   Influenza A NOT DETECTED NOT DETECTED Final   Influenza B NOT DETECTED NOT DETECTED Final   Parainfluenza Virus 1 NOT DETECTED NOT DETECTED Final   Parainfluenza Virus 2 NOT DETECTED NOT DETECTED Final   Parainfluenza Virus 3 NOT DETECTED NOT DETECTED Final   Parainfluenza Virus 4 NOT DETECTED NOT DETECTED Final   Respiratory Syncytial Virus NOT DETECTED NOT DETECTED Final   Bordetella pertussis NOT DETECTED NOT DETECTED Final   Chlamydophila pneumoniae NOT DETECTED NOT DETECTED Final   Mycoplasma pneumoniae NOT DETECTED NOT DETECTED Final  Culture, blood (Routine X  2) w Reflex to ID Panel     Status: None   Collection Time: 09/15/16  8:29 PM  Result Value Ref Range Status   Specimen Description BLOOD LEFT HAND  Final   Special Requests IN PEDIATRIC BOTTLE 1CC  Final   Culture NO GROWTH 5 DAYS  Final   Report Status 09/20/2016 FINAL  Final     Labs: Basic Metabolic Panel:  Recent Labs Lab 09/17/16 0628 09/18/16 0551 09/19/16 0512 09/20/16 0604 09/21/16 0608 09/22/16 0657 09/23/16 0408  NA 137  138 138  138 137 140 142 140 142  K 3.4*  3.5 3.6  3.7 3.7 3.5 3.4* 4.0 3.8  CL 103  104 104  104 107 109 110 109 109  CO2 19*  19* 17*  17* 15* 19* 21* 21* 24  GLUCOSE 117*  115* 115*  114* 105* 101* 103* 89 91  BUN 124*  123* 120*  119* 116* 104* 96* 89* 78*  CREATININE 10.74*  10.71* 10.29*  10.40* 9.34* 8.51* 7.33* 6.17* 5.29*  CALCIUM 8.7*  8.8* 8.7*  8.6* 8.6* 8.9 9.1 9.0 9.0  MG 2.1 2.2 2.1  --   --   --   --   PHOS 8.2* 8.7* 7.5* 7.8* 7.6* 6.1* 5.4*   Liver Function Tests:  Recent Labs Lab 09/19/16 0512 09/20/16 0604 09/21/16 0608 09/22/16 0657 09/23/16 0408  ALBUMIN 2.3* 2.3* 2.2* 2.2* 2.3*   No results for input(s): LIPASE, AMYLASE in the last 168 hours. No results for input(s):  AMMONIA in the last 168 hours. CBC:  Recent Labs Lab 09/18/16 0551 09/19/16 0512 09/20/16 0604  WBC 9.1 8.7 8.4  HGB 9.8* 9.4* 9.1*  HCT 29.7* 28.4* 27.6*  MCV 91.4 91.0 91.7  PLT 469* 484* 469*   Cardiac Enzymes: No results for input(s): CKTOTAL, CKMB, CKMBINDEX, TROPONINI in the last 168 hours. BNP: BNP (last 3 results)  Recent Labs  05/25/16 1240 09/15/16 1418  BNP 594.3* 175.1*    ProBNP (last 3 results) No results for input(s): PROBNP in the last 8760 hours.  CBG:  Recent Labs Lab 09/23/16 0808 09/23/16 1220  GLUCAP 95 112*   Signed:  Velvet Bathe MD.  [facility] 09/23/2016, 3:24 PM

## 2016-09-23 NOTE — Progress Notes (Signed)
NURSING PROGRESS NOTE  [name] [name] [id number] Discharge Data: 09/23/2016 4:21 PM Attending Provider: Velvet Bathe, MD SEG:[id number] Nche, NP     [name] to be D/C'd Home per MD order.  Discussed with the patient the After Visit Summary and all questions fully answered. All IV's discontinued with no bleeding noted. All belongings returned to patient for patient to take home.   Last Vital Signs:  Blood pressure 140/70, pulse 85, temperature 98.7 F (37.1 C), temperature source Oral, resp. rate 18, height _0  (1.651 m), weight (!) 160.6 kg (354 lb 0.9 oz), SpO2 92 %, unknown if currently breastfeeding.  Discharge Medication List Allergies as of 09/23/2016      Reactions   No Known Allergies       Medication List    STOP taking these medications   furosemide 80 MG tablet Commonly known as:  LASIX   potassium chloride SA 20 MEQ tablet Commonly known as:  K-DUR,KLOR-CON     TAKE these medications   famotidine 20 MG tablet Commonly known as:  PEPCID Take 1 tablet (20 mg total) by mouth 2 (two) times daily.   hydrALAZINE 100 MG tablet Commonly known as:  APRESOLINE Take 1 tablet (100 mg total) by mouth 3 (three) times daily.   isosorbide dinitrate 30 MG tablet Commonly known as:  ISORDIL Take 1 tablet (30 mg total) by mouth 3 (three) times daily.   labetalol 100 MG tablet Commonly known as:  NORMODYNE Take 1 tablet (100 mg total) by mouth 2 (two) times daily. What changed:  medication strength  how much to take   multivitamin with minerals Tabs tablet Take 1 tablet by mouth daily.

## 2016-09-24 ENCOUNTER — Ambulatory Visit: Payer: Medicaid Other | Attending: Internal Medicine | Admitting: Physician Assistant

## 2016-09-24 VITALS — BP 156/94 | HR 99 | Temp 98.4°F | Resp 16 | Wt 344.2 lb

## 2016-09-24 DIAGNOSIS — Z6841 Body Mass Index (BMI) 40.0 and over, adult: Secondary | ICD-10-CM | POA: Diagnosis not present

## 2016-09-24 DIAGNOSIS — N183 Chronic kidney disease, stage 3 (moderate): Secondary | ICD-10-CM | POA: Diagnosis present

## 2016-09-24 DIAGNOSIS — Z930 Tracheostomy status: Secondary | ICD-10-CM

## 2016-09-24 DIAGNOSIS — E66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: Secondary | ICD-10-CM | POA: Insufficient documentation

## 2016-09-24 DIAGNOSIS — R197 Diarrhea, unspecified: Secondary | ICD-10-CM | POA: Diagnosis not present

## 2016-09-24 DIAGNOSIS — I5032 Chronic diastolic (congestive) heart failure: Secondary | ICD-10-CM | POA: Diagnosis not present

## 2016-09-24 DIAGNOSIS — E1122 Type 2 diabetes mellitus with diabetic chronic kidney disease: Secondary | ICD-10-CM | POA: Insufficient documentation

## 2016-09-24 DIAGNOSIS — N179 Acute kidney failure, unspecified: Secondary | ICD-10-CM

## 2016-09-24 DIAGNOSIS — N289 Disorder of kidney and ureter, unspecified: Secondary | ICD-10-CM

## 2016-09-24 DIAGNOSIS — I272 Pulmonary hypertension, unspecified: Secondary | ICD-10-CM | POA: Insufficient documentation

## 2016-09-24 DIAGNOSIS — J189 Pneumonia, unspecified organism: Secondary | ICD-10-CM | POA: Insufficient documentation

## 2016-09-24 DIAGNOSIS — I1310 Hypertensive heart and chronic kidney disease without heart failure, with stage 1 through stage 4 chronic kidney disease, or unspecified chronic kidney disease: Secondary | ICD-10-CM | POA: Insufficient documentation

## 2016-09-24 DIAGNOSIS — I10 Essential (primary) hypertension: Secondary | ICD-10-CM

## 2016-09-24 DIAGNOSIS — G4733 Obstructive sleep apnea (adult) (pediatric): Secondary | ICD-10-CM | POA: Insufficient documentation

## 2016-09-24 NOTE — Progress Notes (Signed)
[name] [name], is a 36 y.o. female  HQI:[id number]  XLK:[id number]  DOB - 09/26/1980  Subjective:  Chief Complaint and HPI: [name] [name] is a 36 y.o. female here today follow up visit post hospital discharge. She was in the hospital 09/15/2016-09/23/2016.  She just recently established care with [facility].  It is unclear why she was scheduled with [place] as hospital f/up at this time.  It does appear as though she needs a referral to nephrology that has not been done.  Most recent labs were done yesterday and continued to show improvement.  Today she does not have any complaints.  She is feeling stronger each day.  She denies urinary s/sx, no SOB, no CP. Hospital notes/labs reviewed.  Discharge Diagnoses:  Principal Problem:   Acute renal failure superimposed on stage 3 chronic kidney disease (HCC) Active Problems:   Malignant hypertension   Morbid obesity (HCC)   OSA (obstructive sleep apnea)   Pulmonary hypertension   Chronic respiratory failure with hypercapnia (HCC)   Tracheostomy status (HCC)   Chronic diastolic heart failure (HCC)   Obesity hypoventilation syndrome (HCC)   Pneumonitis   Diarrhea   Abnormal urinalysis   Acute renal failure ([place])   Lower urinary tract infectious disease  ED/Hospital notes reviewed.     ROS:   Constitutional:  No f/c, No night sweats, No unexplained weight loss. EENT:  No vision changes, No blurry vision, No hearing changes. No mouth, throat, or ear problems.  Respiratory: No cough, No SOB. Trach in place-no complaints Cardiac: No CP, no palpitations GI:  No abd pain, No N/V/D. GU: No Urinary s/sx Musculoskeletal: No joint pain Neuro: No headache, no dizziness, no motor weakness.  Skin: No rash Endocrine:  No polydipsia. No polyuria.  Psych: Denies SI/HI  No problems updated.  ALLERGIES: Allergies  Allergen Reactions  . No Known Allergies     PAST MEDICAL HISTORY: Past Medical History:  Diagnosis Date  . Diabetes mellitus  without complication (HCC)    GDM  . Gestational diabetes mellitus in pregnancy 05/24/2013  . HTN in pregnancy, chronic 05/24/2013  . Hypertension   . Morbid obesity ([place])   . Sinus tachycardia     MEDICATIONS AT HOME: Prior to Admission medications   Medication Sig Start Date End Date Taking? Authorizing Provider  famotidine (PEPCID) 20 MG tablet Take 1 tablet (20 mg total) by mouth 2 (two) times daily. Patient not taking: Reported on 09/15/2016 09/15/16   Flossie Buffy, NP  hydrALAZINE (APRESOLINE) 100 MG tablet Take 1 tablet (100 mg total) by mouth 3 (three) times daily. 09/15/16   Flossie Buffy, NP  isosorbide dinitrate (ISORDIL) 30 MG tablet Take 1 tablet (30 mg total) by mouth 3 (three) times daily. 09/15/16   Flossie Buffy, NP  labetalol (NORMODYNE) 100 MG tablet Take 1 tablet (100 mg total) by mouth 2 (two) times daily. 09/23/16   Velvet Bathe, MD  Multiple Vitamin (MULTIVITAMIN WITH MINERALS) TABS tablet Take 1 tablet by mouth daily. 09/15/16   Flossie Buffy, NP     Objective:  EXAM:   Vitals:   09/24/16 1338  BP: (!) 156/94  Pulse: 99  Resp: 16  Temp: 98.4 F (36.9 C)  TempSrc: Oral  SpO2: 96%  Weight: (!) 344 lb 3.2 oz (156.1 kg)    General appearance : A&OX3. NAD. Non-toxic-appearing, obese, ambulating without difficulty HEENT: Atraumatic and Normocephalic.  PERRLA. EOM intact.   Neck: supple, no JVD. No cervical lymphadenopathy. No thyromegaly  Chest/Lungs:  Breathing-non-labored, Good air entry bilaterally, breath sounds normal without rales, rhonchi, or wheezing.  Tracheostomy in place. No surrounding erythema CVS: S1 S2 regular, no murmurs, gallops, rubs  Abdomen: Bowel sounds present, Non tender and not distended with no gaurding, rigidity or rebound. Extremities: Bilateral Lower Ext shows no edema, both legs are warm to touch with = pulse throughout Neurology:  CN II-XII grossly intact, Non focal.   Psych:  TP linear. J/I WNL. Normal speech.  Appropriate eye contact and affect.  Skin:  No Rash  Data Review Lab Results  Component Value Date   HGBA1C 6.2 09/15/2016   HGBA1C 7.9 (H) 05/25/2016     Assessment & Plan   1. Acute kidney injury [facility]) s/p complicated hospital saty and course Improving - Ambulatory referral to Nephrology  2. Renal insufficiency Improving - Ambulatory referral to Nephrology  3. Benign essential HTN Uncontrolled but she only took her medications a few minutes today before her appt.  Take meds as directed.  4. Tracheostomy status ([place]) Keep appt 10/20/2016 with [name] NP at [facility] clinic  5. Hospitalization 09/15/2016-09/23/2016-unsure why patient was scheduled with [place] because she has PCP at [facility].  Patient is stable. Nephrology referral placed.  Keep f/up appt Monday, Feb 5th, 2018 [name] NCHE NP AT 11 AM.  Patient have been counseled extensively about nutrition and exercise  Return for f/up with [facility].  No f/up here needed.  The patient was given clear instructions to go to ER or return to medical center if symptoms don't improve, worsen or new problems develop. The patient verbalized understanding. The patient was told to call to get lab results if they haven't heard anything in the next week.     Freeman Caldron, PA-C [facility] [place], [place]   09/24/2016, 1:53 PMPatient ID: [name] [name], female   DOB: Nov 11, 1980, 36 y.o.   MRN: [id number]

## 2016-09-27 ENCOUNTER — Inpatient Hospital Stay: Payer: Self-pay | Admitting: Nurse Practitioner

## 2016-09-28 ENCOUNTER — Ambulatory Visit (INDEPENDENT_AMBULATORY_CARE_PROVIDER_SITE_OTHER): Payer: Self-pay | Admitting: Pulmonary Disease

## 2016-09-28 ENCOUNTER — Encounter: Payer: Self-pay | Admitting: Pulmonary Disease

## 2016-09-28 DIAGNOSIS — G4733 Obstructive sleep apnea (adult) (pediatric): Secondary | ICD-10-CM

## 2016-09-28 NOTE — Patient Instructions (Signed)
It was a pleasure taking care of you today!  We will schedule you to have a sleep study to determine if you have sleep apnea.     We will get a lab sleep study.  You will be scheduled to have a lab sleep study in 4-6 weeks.  Someone from the sleep lab will call you in 2-3 days to schedule the study with you.  They usually have cancellations every night so most likely, they will have openings for a lab sleep study next week or so.  We encourage you to do your sleep study then if possible. Please give [place] a call in a week is no one from the sleep lab calls you in 2-3 days.   If the sleep study is positive, we will order you a CPAP  machine.  Please call the office if you do NOT receive your machine in the next 1-2 weeks.   Please make sure you use your CPAP device everytime you sleep.  We will monitor the usage of your machine per your insurance requirement.  Your insurance company may take the machine from you if you are not using it regularly.   Please clean the mask, tubings, filter, water reservoir with soapy water every week.  Please use distilled water for the water reservoir.   Please call the office or your machine provider (DME company) if you are having issues with the device.   Return to clinic in 8-10 weeks with Dr. [name] or NP

## 2016-09-28 NOTE — Assessment & Plan Note (Signed)
Weight reduction

## 2016-09-28 NOTE — Addendum Note (Signed)
Addended by: Rosana Berger on: 09/28/2016 02:57 PM   Modules accepted: Orders

## 2016-09-28 NOTE — Assessment & Plan Note (Signed)
Patient was diagnosed with severe sleep apnea in October 2012. She had a lab study which showed AHI of 75. She was very symptomatic with snoring, witnessed apneas, gasping, choking, hypersomnia.  She never hear back from MD who ordered the sleeps study.   Patient was admitted in October 2017. She presented with acuste SOB related to fluid overload. He was in respiratory distress. She ended up being on the ventilator. She stayed at the hospital for one month. She ended up being a tracheostomy. She eventually improved and was discharged to home. Since discharge in October, she has been sleeping with oxygen, 2 L through her tracheostomy. During the daytime, she puts on a Passy-Muir valve. Not on oxygen during the daytime.  Patient was admitted from 09/15/2016 and was discharged on February 1. She came in with generalized weakness 2/2 poor PO intake. She was diagnosed with acute kidney injury on top of chronic kidney disease. She is known to have morbid obesity, sleep apnea, pulmonary hypertension, chronic diastolic heart failure.   She sleeps with her PMV off and with o2 2L.  She has snoring. No witnessed apneas. Sleeps through the night.  Has occasional gasping or choking. Sleeps 7 hrs/night. Wakes up with hypersomnia. Occasional napping.   She unfortunately lost her short-term disability in December 2017. She is working for mental health. Awaiting disability. No medical insurance. She states, is hard for her to go back to work with a tracheostomy.  Plan:  We discussed about the diagnosis of Obstructive Sleep Apnea (OSA) and implications of untreated OSA. We discussed about CPAP and BiPaP as possible treatment options.    Tracheostomy is the ideal treatment for obstructive sleep apnea. Unfortunately, she tells me that she cannot go back to work with a tracheostomy. She has never tried CPAP or BiPAP before. Currently, she is a little symptomatic as far as the sleep apnea is concerned. She sleeps with her  tracheostomy uncapped  and with oxygen, 2 L. Since she does not have medical insurance, I was planning to wait until she gets her disability before we order a sleep study. She was adamant that we get a sleep study and we get her on CPAP or BiPAP so she can have decannulation. I acquiesced to her request. I told her, she'll most likely need to pay out of pocket for the sleep study and a CPAP or BiPAP. I told her, no one would remove her tracheostomy unless she is compliant with her CPAP or BiPAP. She understands.  Plan for a split-night sleep study with her tracheostomy capped on room air. May need CPAP or BiPAP. Likely still has significant sleep apnea. I will not decannulate unless she is on treatment for OSA and tolerating it.    Patient was instructed to call the office if he/she has not heard back from the office 1-2 weeks after the sleep study.   Patient was instructed to call the office if he/she is having issues with the PAP device.   We discussed good sleep hygiene.   Patient was advised not to engage in activities requiring concentration and/or vigilance if he/she is sleepy.  Patient was advised not to drive if he/she is sleepy.

## 2016-09-28 NOTE — Progress Notes (Signed)
Subjective:    Patient ID: [name] [name], female    DOB: [birth year], 36 y.o.   MRN: [id number]  HPI   This is the case of [name] [name], 36 y.o. Female, who was referred by [name] NP in consultation regarding OSA  As you very well know, patient is a non smoker, not known to have asthma or copd.   Patient was diagnosed with severe sleep apnea in October 2012. She had a lab study which showed AHI of 75. She was very symptomatic with snoring, witnessed apneas, gasping, choking, hypersomnia.  She never hear back from MD who ordered the sleeps study.   Patient was admitted in October 2017. She presented with acuste SOB related to fluid overload. He was in respiratory distress. She ended up being on the ventilator. She stayed at the hospital for one month. She ended up being a tracheostomy. She eventually improved and was discharged to home. Since discharge in October, she has been sleeping with oxygen, 2 L through her tracheostomy. During the daytime, she puts on a Passy-Muir valve. Not on oxygen during the daytime.  Patient was admitted from 09/15/2016 and was discharged on February 1. She came in with generalized weakness 2/2 poor PO intake. She was diagnosed with acute kidney injury on top of chronic kidney disease. She is known to have morbid obesity, sleep apnea, pulmonary hypertension, chronic diastolic heart failure.   She sleeps with her PMV off and with o2 2L.  She has snoring. No witnessed apneas. Sleepos through the night.  Has occasional gasping or choking. Sleeps 7 hrs/night. Wakes up somewhat refreshed. Denies napping.   She unfortunately lost her short-term disability in December 2017. She is working for mental health. Awaiting disability. No medical insurance. She states, is hard for her to go back to work with a tracheostomy.   Review of Systems  Constitutional: Negative.  Negative for fever and unexpected weight change.  HENT: Negative.  Negative for congestion, dental  problem, ear pain, nosebleeds, postnasal drip, rhinorrhea, sinus pressure, sneezing, sore throat and trouble swallowing.   Eyes: Negative.  Negative for redness and itching.  Respiratory: Positive for cough. Negative for chest tightness, shortness of breath and wheezing.   Cardiovascular: Negative.  Negative for palpitations and leg swelling.  Gastrointestinal: Negative.  Negative for nausea and vomiting.  Endocrine: Negative.   Genitourinary: Negative.  Negative for dysuria.  Musculoskeletal: Negative.  Negative for joint swelling.  Skin: Negative.  Negative for rash.  Allergic/Immunologic: Positive for environmental allergies.  Neurological: Negative.  Negative for headaches.  Hematological: Negative.  Does not bruise/bleed easily.  Psychiatric/Behavioral: Negative.  Negative for dysphoric mood. The patient is not nervous/anxious.    Past Medical History:  Diagnosis Date  . Diabetes mellitus without complication (HCC)    GDM  . Gestational diabetes mellitus in pregnancy 05/24/2013  . HTN in pregnancy, chronic 05/24/2013  . Hypertension   . Morbid obesity ([place])   . Sinus tachycardia    CKD.  (-) DVT, CA.  CHFpEF. Morbid obesity.   Family History  Problem Relation Age of Onset  . Hypertension Mother   . Diabetes Other   . Birth defects Paternal Grandfather     unknown type     Past Surgical History:  Procedure Laterality Date  . TRACHEOSTOMY TUBE PLACEMENT N/A 06/11/2016   Procedure: TRACHEOSTOMY;  Surgeon: Melida Quitter, MD;  Location: [facility];  Service: ENT;  Laterality: N/A;    Social History   Social History  . Marital  status: Single    Spouse name: N/A  . Number of children: N/A  . Years of education: N/A   Occupational History  . 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 Smoking status: Never Smoker  . Smokeless tobacco: Never Used  . Alcohol use No  . Drug use: No  . Sexual activity: Not Currently    Birth control/ protection: IUD    Other Topics Concern  . Not on file   Social History Narrative   LIVES ALONE   WORKS AT [employer]   DENIES ANY TOBACCO OR ETOH USE          Has a daughter. awaitng on disabilty.   Allergies  Allergen Reactions  . No Known Allergies      Outpatient Medications Prior to Visit  Medication Sig Dispense Refill  . hydrALAZINE (APRESOLINE) 100 MG tablet Take 1 tablet (100 mg total) by mouth 3 (three) times daily. 90 tablet 4  . isosorbide dinitrate (ISORDIL) 30 MG tablet Take 1 tablet (30 mg total) by mouth 3 (three) times daily. 90 tablet 4  . labetalol (NORMODYNE) 100 MG tablet Take 1 tablet (100 mg total) by mouth 2 (two) times daily. 60 tablet 0  . famotidine (PEPCID) 20 MG tablet Take 1 tablet (20 mg total) by mouth 2 (two) times daily. (Patient not taking: Reported on 09/15/2016) 60 tablet 3  . Multiple Vitamin (MULTIVITAMIN WITH MINERALS) TABS tablet Take 1 tablet by mouth daily. (Patient not taking: Reported on 09/28/2016) 30 tablet 0   No facility-administered medications prior to visit.    No orders of the defined types were placed in this encounter.        Objective:   Physical Exam   Vitals:  Vitals:   09/28/16 1359  BP: (!) 142/98  Pulse: 93  Weight: (!) 343 lb 12.8 oz (155.9 kg)  Height: _0  (1.651 m)    Constitutional/General:  Pleasant, well-nourished, well-developed, not in any distress,  Comfortably seating.  Well kempt  Body mass index is 57.21 kg/m. Wt Readings from Last 3 Encounters:  09/28/16 (!) 343 lb 12.8 oz (155.9 kg)  09/24/16 (!) 344 lb 3.2 oz (156.1 kg)  09/22/16 (!) 354 lb 0.9 oz (160.6 kg)     HEENT: Pupils equal and reactive to light and accommodation. Anicteric sclerae. Normal nasal mucosa.   No oral  lesions,  mouth clear,  oropharynx clear, no postnasal drip. (-) Oral thrush. No dental caries.  Airway - Mallampati class IV  Neck: No masses. Midline trachea. No JVD, (-) LAD. (-) bruits appreciated. (+) #6  shiley tracheostomy, looks clean.   Respiratory/Chest: Grossly normal chest. (-) deformity. (-) Accessory muscle use.  Symmetric expansion. (-) Tenderness on palpation.  Resonant on percussion.  Diminished BS on both lower lung zones. (-) wheezing, crackles, rhonchi (-) egophony  Cardiovascular: Regular rate and  rhythm, heart sounds normal, no murmur or gallops, no peripheral edema  Gastrointestinal:  Normal bowel sounds. Soft, non-tender. No hepatosplenomegaly.  (-) masses.   Musculoskeletal:  Normal muscle tone. Normal gait.   Extremities: Grossly normal. (-) clubbing, cyanosis.  (-) edema  Skin: (-) rash,lesions seen.   Neurological/Psychiatric : alert, oriented to time, place, person. Normal mood and affect         Assessment & Plan:  OSA (obstructive sleep apnea) Patient was diagnosed with severe sleep apnea in October 2012. She had a lab study which showed AHI of 75. She was very  symptomatic with snoring, witnessed apneas, gasping, choking, hypersomnia.  She never hear back from MD who ordered the sleeps study.   Patient was admitted in October 2017. She presented with acuste SOB related to fluid overload. He was in respiratory distress. She ended up being on the ventilator. She stayed at the hospital for one month. She ended up being a tracheostomy. She eventually improved and was discharged to home. Since discharge in October, she has been sleeping with oxygen, 2 L through her tracheostomy. During the daytime, she puts on a Passy-Muir valve. Not on oxygen during the daytime.  Patient was admitted from 09/15/2016 and was discharged on February 1. She came in with generalized weakness 2/2 poor PO intake. She was diagnosed with acute kidney injury on top of chronic kidney disease. She is known to have morbid obesity, sleep apnea, pulmonary hypertension, chronic diastolic heart failure.   She sleeps with her PMV off and with o2 2L.  She has snoring. No witnessed apneas.  Sleeps through the night.  Has occasional gasping or choking. Sleeps 7 hrs/night. Wakes up with hypersomnia. Occasional napping.   She unfortunately lost her short-term disability in December 2017. She is working for mental health. Awaiting disability. No medical insurance. She states, is hard for her to go back to work with a tracheostomy.  Plan:  We discussed about the diagnosis of Obstructive Sleep Apnea (OSA) and implications of untreated OSA. We discussed about CPAP and BiPaP as possible treatment options.    Tracheostomy is the ideal treatment for obstructive sleep apnea. Unfortunately, she tells me that she cannot go back to work with a tracheostomy. She has never tried CPAP or BiPAP before. Currently, she is a little symptomatic as far as the sleep apnea is concerned. She sleeps with her tracheostomy uncapped  and with oxygen, 2 L. Since she does not have medical insurance, I was planning to wait until she gets her disability before we order a sleep study. She was adamant that we get a sleep study and we get her on CPAP or BiPAP so she can have decannulation. I acquiesced to her request. I told her, she'll most likely need to pay out of pocket for the sleep study and a CPAP or BiPAP. I told her, no one would remove her tracheostomy unless she is compliant with her CPAP or BiPAP. She understands.  Plan for a split-night sleep study with her tracheostomy capped on room air. May need CPAP or BiPAP. Likely still has significant sleep apnea. I will not decannulate unless she is on treatment for OSA and tolerating it.    Patient was instructed to call the office if he/she has not heard back from the office 1-2 weeks after the sleep study.   Patient was instructed to call the office if he/she is having issues with the PAP device.   We discussed good sleep hygiene.   Patient was advised not to engage in activities requiring concentration and/or vigilance if he/she is sleepy.  Patient was advised  not to drive if he/she is sleepy.     Morbid obesity ([place]) Weight reduction     Thank you very much for letting me participate in this patient's care. Please do not hesitate to give me a call if you have any questions or concerns regarding the treatment plan.   Patient will follow up with me in 8-10 weeks.     Monica Becton, MD 09/28/2016   2:53 PM Pulmonary and Critical Care Medicine  [place] Pager: [phone] Office: [phone], Fax: [phone] ;

## 2016-09-30 ENCOUNTER — Ambulatory Visit (INDEPENDENT_AMBULATORY_CARE_PROVIDER_SITE_OTHER): Payer: Self-pay | Admitting: Nurse Practitioner

## 2016-09-30 ENCOUNTER — Encounter: Payer: Self-pay | Admitting: Nurse Practitioner

## 2016-09-30 ENCOUNTER — Other Ambulatory Visit (INDEPENDENT_AMBULATORY_CARE_PROVIDER_SITE_OTHER): Payer: Self-pay

## 2016-09-30 VITALS — BP 164/100 | HR 85 | Temp 98.1°F | Ht 65.0 in | Wt 344.0 lb

## 2016-09-30 DIAGNOSIS — R8290 Unspecified abnormal findings in urine: Secondary | ICD-10-CM

## 2016-09-30 DIAGNOSIS — N179 Acute kidney failure, unspecified: Secondary | ICD-10-CM

## 2016-09-30 DIAGNOSIS — N183 Chronic kidney disease, stage 3 (moderate): Secondary | ICD-10-CM

## 2016-09-30 DIAGNOSIS — I5032 Chronic diastolic (congestive) heart failure: Secondary | ICD-10-CM

## 2016-09-30 DIAGNOSIS — I272 Pulmonary hypertension, unspecified: Secondary | ICD-10-CM

## 2016-09-30 DIAGNOSIS — J189 Pneumonia, unspecified organism: Secondary | ICD-10-CM

## 2016-09-30 DIAGNOSIS — I10 Essential (primary) hypertension: Secondary | ICD-10-CM

## 2016-09-30 LAB — URINALYSIS, ROUTINE W REFLEX MICROSCOPIC
Bilirubin Urine: NEGATIVE
Ketones, ur: NEGATIVE
Nitrite: NEGATIVE
Specific Gravity, Urine: 1.015
Urine Glucose: NEGATIVE
Urobilinogen, UA: 0.2
pH: 5.5 (ref 5.0–8.0)

## 2016-09-30 MED ORDER — LABETALOL HCL 100 MG PO TABS: 100.0000 mg | ORAL_TABLET | Freq: Two times a day (BID) | ORAL | 3 refills | Status: DC

## 2016-09-30 NOTE — Progress Notes (Signed)
Subjective:  Patient ID: [name] [name], female    DOB: 1981/03/04  Age: 36 y.o. MRN: [id number]  CC: Hospitalization Follow-up (hospital fu)   Hypertension  This is a chronic problem. The current episode started more than 1 year ago. The problem has been waxing and waning since onset. The problem is uncontrolled. Pertinent negatives include no anxiety, blurred vision, chest pain, headaches, malaise/fatigue, neck pain, orthopnea, palpitations, peripheral edema or shortness of breath. Risk factors for coronary artery disease include obesity, diabetes mellitus, sedentary lifestyle, family history and dyslipidemia. Past treatments include calcium channel blockers, central alpha agonists, direct vasodilators and beta blockers. Compliance problems: she admits to not taking medications as prescribed.  Hypertensive end-organ damage includes kidney disease and heart failure. There is no history of left ventricular hypertrophy. Identifiable causes of hypertension include chronic renal disease and sleep apnea.    Outpatient Medications Prior to Visit  Medication Sig Dispense Refill  . hydrALAZINE (APRESOLINE) 100 MG tablet Take 1 tablet (100 mg total) by mouth 3 (three) times daily. 90 tablet 4  . isosorbide dinitrate (ISORDIL) 30 MG tablet Take 1 tablet (30 mg total) by mouth 3 (three) times daily. 90 tablet 4  . labetalol (NORMODYNE) 100 MG tablet Take 1 tablet (100 mg total) by mouth 2 (two) times daily. 60 tablet 0  . famotidine (PEPCID) 20 MG tablet Take 1 tablet (20 mg total) by mouth 2 (two) times daily. (Patient not taking: Reported on 09/15/2016) 60 tablet 3   No facility-administered medications prior to visit.     ROS See HPI  Objective:  BP (!) 164/100   Pulse 85   Temp 98.1 F (36.7 C)   Ht _0  (1.651 m)   Wt (!) 344 lb (156 kg)   SpO2 96%   BMI 57.24 kg/m   BP Readings from Last 3 Encounters:  09/30/16 (!) 164/100  09/28/16 (!) 142/98  09/24/16 (!) 156/94    Wt Readings  from Last 3 Encounters:  09/30/16 (!) 344 lb (156 kg)  09/28/16 (!) 343 lb 12.8 oz (155.9 kg)  09/24/16 (!) 344 lb 3.2 oz (156.1 kg)    Physical Exam  Constitutional: She is oriented to person, place, and time. No distress.  Neck: Normal range of motion. Neck supple.  Cardiovascular: Normal rate and normal heart sounds.   Pulmonary/Chest: Effort normal and breath sounds normal.  Musculoskeletal: She exhibits no edema.  Neurological: She is alert and oriented to person, place, and time.  Skin: Skin is warm and dry.  Vitals reviewed.   Lab Results  Component Value Date   WBC 8.4 09/20/2016   HGB 9.1 (L) 09/20/2016   HCT 27.6 (L) 09/20/2016   PLT 469 (H) 09/20/2016   GLUCOSE 91 09/23/2016   CHOL 176 09/15/2016   TRIG 250.0 (H) 09/15/2016   HDL 10.60 (L) 09/15/2016   LDLDIRECT 99.0 09/15/2016   LDLCALC 92 05/26/2016   ALT 16 09/16/2016   AST 18 09/16/2016   NA 142 09/23/2016   K 3.8 09/23/2016   CL 109 09/23/2016   CREATININE 5.29 (H) 09/23/2016   BUN 78 (H) 09/23/2016   CO2 24 09/23/2016   TSH 2.615 05/14/2011   INR 1.27 09/18/2016   HGBA1C 6.2 09/15/2016    Dg Chest 2 View  Result Date: 09/15/2016 CLINICAL DATA:  URI. EXAM: CHEST  2 VIEW COMPARISON:  06/28/2016 . FINDINGS: Tracheostomy tube noted in good anatomic position. Cardiomegaly with mild pulmonary vascular prominence and bilateral interstitial prominence consistent congestive heart  failure. Interstitial pneumonitis cannot be excluded. No pleural effusion or pneumothorax. IMPRESSION: 1. Tracheostomy tube noted good anatomic position. 2. Cardiomegaly with mild pulmonary vascular prominence and bilateral interstitial prominence consistent with CHF. Interstitial pneumonitis cannot be excluded . Electronically Signed   By: Marcello Moores  Register   On: 09/15/2016 13:57   US Renal  Result Date: 09/15/2016 CLINICAL DATA:  Acute kidney injury.  UTI.  Hypertension.  Diabetes. EXAM: RENAL / URINARY TRACT ULTRASOUND COMPLETE  COMPARISON:  None. FINDINGS: Right Kidney: Length: 14.9 cm. Prominent cortical thickness. This could be secondary to diabetic glomerulonephropathy or acute nephritis. No hydronephrosis. No focal lesion. Left Kidney: Length: 13.3 cm. Prominent cortical thickness. This could be secondary to diabetic glomerulonephropathy or acute nephritis. No hydronephrosis. No focal lesion. Bladder: Appears normal for degree of bladder distention. IMPRESSION: Bilateral renal enlargement, more on the right than the left. Prominent cortical thickness. This could be secondary to diabetic glomerulonephropathy or acute nephritis. Electronically Signed   By: Nelson Chimes M.D.   On: 09/15/2016 16:40    Assessment & Plan:   [name] [name] was seen today for hospitalization follow-up.  Diagnoses and all orders for this visit:  Pulmonary hypertension -     DG Chest 2 View; Future  Chronic diastolic heart failure (HCC) -     Comprehensive metabolic panel -     labetalol (NORMODYNE) 100 MG tablet; Take 1 tablet (100 mg total) by mouth 2 (two) times daily.  Benign essential HTN -     Comprehensive metabolic panel -     labetalol (NORMODYNE) 100 MG tablet; Take 1 tablet (100 mg total) by mouth 2 (two) times daily.  Acute renal failure superimposed on stage 3 chronic kidney disease, unspecified acute renal failure type (HCC) -     Comprehensive metabolic panel  Abnormal urinalysis -     Urinalysis, Routine w reflex microscopic; Future  Pneumonitis -     DG Chest 2 View; Future   I am having [name] [name] maintain her famotidine, hydrALAZINE, isosorbide dinitrate, furosemide, KLOR-CON M20, and labetalol.  Meds ordered this encounter  Medications  . furosemide (LASIX) 80 MG tablet    Sig: Take 80 mg by mouth 3 (three) times daily.    Refill:  0  . KLOR-CON M20 20 MEQ tablet    Sig: Take 20 mEq by mouth 2 (two) times daily.    Refill:  0  . labetalol (NORMODYNE) 100 MG tablet    Sig: Take 1 tablet (100 mg total) by mouth  2 (two) times daily.    Dispense:  60 tablet    Refill:  3    Order Specific Question:   Supervising Provider    Answer:   Cassandria Anger [[id number]]    Follow-up: Return in about 4 weeks (around 10/28/2016) for HTN and CHF.  Wilfred Lacy, NP

## 2016-09-30 NOTE — Patient Instructions (Addendum)
Go to basement for blood draw and to provide urine sample (urinalysis and urine microalbuminuria). You will be called with results. Follow up with nephrologist when scheduled. Follow up in 74month Needs repeat echocardiogram after sleep study is done 11/11/16.  Check weight daily and record. Call office if weight gain of >3lbs is 3days. Check BP daily and record. Call office if BP persistently greater than 150/90 for the next 3days.  Continue to hold furosemide and potassium.  Hypertension Hypertension, commonly called high blood pressure, is when the force of blood pumping through your arteries is too strong. Your arteries are the blood vessels that carry blood from your heart throughout your body. A blood pressure reading consists of a higher number over a lower number, such as 110/72. The higher number (systolic) is the pressure inside your arteries when your heart pumps. The lower number (diastolic) is the pressure inside your arteries when your heart relaxes. Ideally you want your blood pressure below 120/80. Hypertension forces your heart to work harder to pump blood. Your arteries may become narrow or stiff. Having untreated or uncontrolled hypertension can cause heart attack, stroke, kidney disease, and other problems. What increases the risk? Some risk factors for high blood pressure are controllable. Others are not. Risk factors you cannot control include:  Race. You may be at higher risk if you are African American.  Age. Risk increases with age.  Gender. Men are at higher risk than women before age 5566years. After age 38 women are at higher risk than men. Risk factors you can control include:  Not getting enough exercise or physical activity.  Being overweight.  Getting too much fat, sugar, calories, or salt in your diet.  Drinking too much alcohol. What are the signs or symptoms? Hypertension does not usually cause signs or symptoms. Extremely high blood pressure  (hypertensive crisis) may cause headache, anxiety, shortness of breath, and nosebleed. How is this diagnosed? To check if you have hypertension, your health care provider will measure your blood pressure while you are seated, with your arm held at the level of your heart. It should be measured at least twice using the same arm. Certain conditions can cause a difference in blood pressure between your right and left arms. A blood pressure reading that is higher than normal on one occasion does not mean that you need treatment. If it is not clear whether you have high blood pressure, you may be asked to return on a different day to have your blood pressure checked again. Or, you may be asked to monitor your blood pressure at home for 1 or more weeks. How is this treated? Treating high blood pressure includes making lifestyle changes and possibly taking medicine. Living a healthy lifestyle can help lower high blood pressure. You may need to change some of your habits. Lifestyle changes may include:  Following the DASH diet. This diet is high in fruits, vegetables, and whole grains. It is low in salt, red meat, and added sugars.  Keep your sodium intake below 2,300 mg per day.  Getting at least 30-45 minutes of aerobic exercise at least 4 times per week.  Losing weight if necessary.  Not smoking.  Limiting alcoholic beverages.  Learning ways to reduce stress. Your health care provider may prescribe medicine if lifestyle changes are not enough to get your blood pressure under control, and if one of the following is true:  You are [age]years of age and your systolic blood pressure is above 140.  You are 53 years of age or older, and your systolic blood pressure is above 150.  Your diastolic blood pressure is above 90.  You have diabetes, and your systolic blood pressure is over 324 or your diastolic blood pressure is over 90.  You have kidney disease and your blood pressure is above  140/90.  You have heart disease and your blood pressure is above 140/90. Your personal target blood pressure may vary depending on your medical conditions, your age, and other factors. Follow these instructions at home:  Have your blood pressure rechecked as directed by your health care provider.  Take medicines only as directed by your health care provider. Follow the directions carefully. Blood pressure medicines must be taken as prescribed. The medicine does not work as well when you skip doses. Skipping doses also puts you at risk for problems.  Do not smoke.  Monitor your blood pressure at home as directed by your health care provider. Contact a health care provider if:  You think you are having a reaction to medicines taken.  You have recurrent headaches or feel dizzy.  You have swelling in your ankles.  You have trouble with your vision. Get help right away if:  You develop a severe headache or confusion.  You have unusual weakness, numbness, or feel faint.  You have severe chest or abdominal pain.  You vomit repeatedly.  You have trouble breathing. This information is not intended to replace advice given to you by your health care provider. Make sure you discuss any questions you have with your health care provider. Document Released: 08/09/2005 Document Revised: 01/15/2016 Document Reviewed: 06/01/2013 Elsevier Interactive Patient Education  2017 [employer]. Heart Failure Heart failure is a condition in which the heart has trouble pumping blood because it has become weak or stiff. This means that the heart does not pump blood efficiently for the body to work well. For some people with heart failure, fluid may back up into the lungs and there may be swelling (edema) in the lower legs. Heart failure is usually a long-term (chronic) condition. It is important for you to take good care of yourself and follow the treatment plan from your health care provider. What are the  causes? This condition is caused by some health problems, including:  High blood pressure (hypertension). Hypertension causes the heart muscle to work harder than normal. High blood pressure eventually causes the heart to become stiff and weak.  Coronary artery disease (CAD). CAD is the buildup of cholesterol and fat (plaques) in the arteries of the heart.  Heart attack (myocardial infarction). Injured tissue, which is caused by the heart attack, does not contract as well and the heart's ability to pump blood is weakened.  Abnormal heart valves. When the heart valves do not open and close properly, the heart muscle must pump harder to keep the blood flowing.  Heart muscle disease (cardiomyopathy or myocarditis). Heart muscle disease is damage to the heart muscle from a variety of causes, such as drug or alcohol abuse, infections, or unknown causes. These can increase the risk of heart failure.  Lung disease. When the lungs do not work properly, the heart must work harder. What increases the risk? Risk of heart failure increases as a person ages. This condition is also more likely to develop in people who:  Are overweight.  Are female.  Smoke or chew tobacco.  Abuse alcohol or illegal drugs.  Have taken medicines that can damage the heart, such as chemotherapy  drugs.  Have diabetes.  High blood sugar (glucose) is associated with high fat (lipid) levels in the blood.  Diabetes can also damage tiny blood vessels that carry nutrients to the heart muscle.  Have abnormal heart rhythms.  Have thyroid problems.  Have low blood counts (anemia). What are the signs or symptoms? Symptoms of this condition include:  Shortness of breath with activity, such as when climbing stairs.  Persistent cough.  Swelling of the feet, ankles, legs, or abdomen.  Unexplained weight gain.  Difficulty breathing when lying flat (orthopnea).  Waking from sleep because of the need to sit up and get  more air.  Rapid heartbeat.  Fatigue and loss of energy.  Feeling light-headed, dizzy, or close to fainting.  Loss of appetite.  Nausea.  Increased urination during the night (nocturia).  Confusion. How is this diagnosed? This condition is diagnosed based on:  Medical history, symptoms, and a physical exam.  Diagnostic tests, which may include:  Echocardiogram.  Electrocardiogram (ECG).  Chest X-ray.  Blood tests.  Exercise stress test.  Radionuclide scans.  Cardiac catheterization and angiogram. How is this treated? Treatment for this condition is aimed at managing the symptoms of heart failure. Medicines, behavioral changes, or other treatments may be necessary to treat heart failure. Medicines  These may include:  Angiotensin-converting enzyme (ACE) inhibitors. This type of medicine blocks the effects of a blood protein called angiotensin-converting enzyme. ACE inhibitors relax (dilate) the blood vessels and help to lower blood pressure.  Angiotensin receptor blockers (ARBs). This type of medicine blocks the actions of a blood protein called angiotensin. ARBs dilate the blood vessels and help to lower blood pressure.  Water pills (diuretics). Diuretics cause the kidneys to remove salt and water from the blood. The extra fluid is removed through urination, leaving a lower volume of blood that the heart has to pump.  Beta blockers. These improve heart muscle strength and they prevent the heart from beating too quickly.  Digoxin. This increases the force of the heartbeat. Healthy behavior changes  These may include:  Reaching and maintaining a healthy weight.  Stopping smoking or chewing tobacco.  Eating heart-healthy foods.  Limiting or avoiding alcohol.  Stopping use of street drugs (illegal drugs).  Physical activity. Other treatments  These may include:  Surgery to open blocked coronary arteries or repair damaged heart valves.  Placement of a  biventricular pacemaker to improve heart muscle function (cardiac resynchronization therapy). This device paces both the right ventricle and left ventricle.  Placement of a device to treat serious abnormal heart rhythms (implantable cardioverter defibrillator, or ICD).  Placement of a device to improve the pumping ability of the heart (left ventricular assist device, or LVAD).  Heart transplant. This can cure heart failure, and it is considered for certain patients who do not improve with other therapies. Follow these instructions at home: Medicines  Take over-the-counter and prescription medicines only as told by your health care provider. Medicines are important in reducing the workload of your heart, slowing the progression of heart failure, and improving your symptoms.  Do not stop taking your medicine unless your health care provider told you to do that.  Do not skip any dose of medicine.  Refill your prescriptions before you run out of medicine. You need your medicines every day. Eating and drinking  Eat heart-healthy foods. Talk with a dietitian to make an eating plan that is right for you.  Choose foods that contain no trans fat and are low in  saturated fat and cholesterol. Healthy choices include fresh or frozen fruits and vegetables, fish, lean meats, legumes, fat-free or low-fat dairy products, and whole-grain or high-fiber foods.  Limit salt (sodium) if directed by your health care provider. Sodium restriction may reduce symptoms of heart failure. Ask a dietitian to recommend heart-healthy seasonings.  Use healthy cooking methods instead of frying. Healthy methods include roasting, grilling, broiling, baking, poaching, steaming, and stir-frying.  Limit your fluid intake if directed by your health care provider. Fluid restriction may reduce symptoms of heart failure. Lifestyle  Stop smoking or using chewing tobacco. Nicotine and tobacco can damage your heart and your blood  vessels. Do not use nicotine gum or patches before talking to your health care provider.  Limit alcohol intake to no more than 1 drink per day for non-pregnant women and 2 drinks per day for men. One drink equals 12 oz of beer, 5 oz of wine, or 1 oz of hard liquor.  Drinking more than that is harmful to your heart. Tell your health care provider if you drink alcohol several times a week.  Talk with your health care provider about whether any level of alcohol use is safe for you.  If your heart has already been damaged by alcohol or you have severe heart failure, drinking alcohol should be stopped completely.  Stop use of illegal drugs.  Lose weight if directed by your health care provider. Weight loss may reduce symptoms of heart failure.  Do moderate physical activity if directed by your health care provider. People who are elderly and people with severe heart failure should consult with a health care provider for physical activity recommendations. Monitor important information  Weigh yourself every day. Keeping track of your weight daily helps you to notice excess fluid sooner.  Weigh yourself every morning after you urinate and before you eat breakfast.  Wear the same amount of clothing each time you weigh yourself.  Record your daily weight. Provide your health care provider with your weight record.  Monitor and record your blood pressure as told by your health care provider.  Check your pulse as told by your health care provider. Dealing with extreme temperatures  If the weather is extremely hot:  Avoid vigorous physical activity.  Use air conditioning or fans or seek a cooler location.  Avoid caffeine and alcohol.  Wear loose-fitting, lightweight, and light-colored clothing.  If the weather is extremely cold:  Avoid vigorous physical activity.  Layer your clothes.  Wear mittens or gloves, a hat, and a scarf when you go outside.  Avoid alcohol. General  instructions  Manage other health conditions such as hypertension, diabetes, thyroid disease, or abnormal heart rhythms as told by your health care provider.  Learn to manage stress. If you need help to do this, ask your health care provider.  Plan rest periods when fatigued.  Get ongoing education and support as needed.  Participate in or seek rehabilitation as needed to maintain or improve independence and quality of life.  Stay up to date with immunizations. Keeping current on pneumococcal and influenza immunizations is especially important to prevent respiratory infections.  Keep all follow-up visits as told by your health care provider. This is important. Contact a health care provider if:  You have a rapid weight gain.  You have increasing shortness of breath that is unusual for you.  You are unable to participate in your usual physical activities.  You tire easily.  You cough more than normal, especially with physical  activity.  You have any swelling or more swelling in areas such as your hands, feet, ankles, or abdomen.  You are unable to sleep because it is hard to breathe.  You feel like your heart is beating quickly (palpitations).  You become dizzy or light-headed when you stand up. Get help right away if:  You have difficulty breathing.  You notice or your family notices a change in your awareness, such as having trouble staying awake or having difficulty with concentration.  You have pain or discomfort in your chest.  You have an episode of fainting (syncope). This information is not intended to replace advice given to you by your health care provider. Make sure you discuss any questions you have with your health care provider. Document Released: 08/09/2005 Document Revised: 04/13/2016 Document Reviewed: 03/03/2016 Elsevier Interactive Patient Education  2017 [employer].

## 2016-09-30 NOTE — Progress Notes (Signed)
Pre visit review using our clinic review tool, if applicable. No additional management support is needed unless otherwise documented below in the visit note. 

## 2016-10-01 ENCOUNTER — Other Ambulatory Visit: Payer: Self-pay

## 2016-10-01 DIAGNOSIS — R8290 Unspecified abnormal findings in urine: Secondary | ICD-10-CM

## 2016-10-02 LAB — URINE CULTURE: Organism ID, Bacteria: NO GROWTH

## 2016-10-04 NOTE — Progress Notes (Signed)
Normal results, see office note

## 2016-10-11 ENCOUNTER — Telehealth: Payer: Self-pay | Admitting: *Deleted

## 2016-10-11 DIAGNOSIS — I10 Essential (primary) hypertension: Secondary | ICD-10-CM

## 2016-10-11 MED ORDER — HYDRALAZINE HCL 100 MG PO TABS: 100.0000 mg | ORAL_TABLET | Freq: Three times a day (TID) | ORAL | 3 refills | Status: DC

## 2016-10-11 NOTE — Telephone Encounter (Signed)
Rec'd call pt states saw Baldo Ash last week she sent in rx for wrong med. She did not need the Labetalol she states she need the hydralazine. Verified pharmacy inform will send to [facility].../lmb

## 2016-10-12 ENCOUNTER — Ambulatory Visit: Payer: Self-pay | Admitting: Physician Assistant

## 2016-10-14 ENCOUNTER — Encounter: Payer: Self-pay | Admitting: Physician Assistant

## 2016-10-20 ENCOUNTER — Ambulatory Visit (HOSPITAL_COMMUNITY)
Admission: RE | Admit: 2016-10-20 | Discharge: 2016-10-20 | Disposition: A | Discharge status: Home / Self Care | Payer: Medicaid Other | Source: Ambulatory Visit | Attending: Acute Care | Admitting: Acute Care

## 2016-10-20 DIAGNOSIS — G4733 Obstructive sleep apnea (adult) (pediatric): Secondary | ICD-10-CM | POA: Insufficient documentation

## 2016-10-20 DIAGNOSIS — Z930 Tracheostomy status: Secondary | ICD-10-CM

## 2016-10-20 NOTE — Progress Notes (Signed)
Subjective:  ROV    Patient ID: [name] [name], female    DOB: 08/02/1981, 36 y.o.   MRN: [id number]  HPI 36 year old female her in f/u for trach care. Last seen 11/29. Since our last visit she has been seen by Dr [name] and has a sleep study planned for end of March. She has been doing well. Uses a PMV during the day. Is ambulatory & doing well with the trach. She is here for routine trach chanve   Review of Systems  All other systems reviewed and are negative.     Vital signs:blood pressure 193/122, pulse 94, respirations 12 and pulse oximetry 94 % Objective:   Physical Exam  Constitutional: She is oriented to person, place, and time. She appears well-developed and well-nourished. No distress.  HENT:  Head: Normocephalic and atraumatic.  Mouth/Throat: Oropharynx is clear and moist. No oropharyngeal exudate.  Eyes: EOM are normal. Pupils are equal, round, and reactive to light. Right eye exhibits no discharge.  Neck: Normal range of motion. Neck supple.  Cardiovascular: Normal rate and regular rhythm.   No murmur heard. Pulmonary/Chest: Effort normal and breath sounds normal. No stridor. No respiratory distress. She has no wheezes. She has no rales.  Abdominal: Soft. Bowel sounds are normal. She exhibits no distension. There is no tenderness. There is no rebound.  Musculoskeletal: Normal range of motion. She exhibits no edema or deformity.  Neurological: She is alert and oriented to person, place, and time.  Skin: Skin is warm and dry. She is not diaphoretic.  Psychiatric: She has a normal mood and affect. Her behavior is normal.   Procedure:  Trach change. Changed trach from # 6 XLT to #5 portex trach. 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 Hoping that she will do well w/ PSG and titration study so that we can decannulate her. I down sized her trach a little further in hopes that  this will help her more w/ capping trials. Will plan on seeing her in 8 weeks but have told her to contact me if all goes well and CPAP can be arranged prior   Plan Cap as much as possible during day Open at night PSG pending w/ titration study then will re-eval for decannualation   Erick Colace ACNP-BC [facility] Pager # [phone] OR # [phone] if no answer

## 2016-10-20 NOTE — Progress Notes (Signed)
Tracheostomy Procedure Note  [name] [name] [id number] 09-07-1980  Pre Procedure Tracheostomy Information  Trach Brand: Shiley Size: 6.0 XLT Style: Uncuffed Secured by: Velcro   Procedure: trach changed    Post Procedure Tracheostomy Information  Trach Brand: Bivona Size: 5.0 Style: Uncuffed Secured by: Velcro   Post Procedure Evaluation:  ETCO2 positive color change from yellow to purple : Yes.   Vital signs:blood pressure 193/122, pulse 94, respirations 12 and pulse oximetry 94 % Patients current condition: stable Complications: No apparent complications Trach site exam: clean Wound care done: dry Patient did tolerate procedure well.   Education: Pt instructed to wear red cap at night  Prescription needs:     Additional needs:

## 2016-10-25 ENCOUNTER — Ambulatory Visit (INDEPENDENT_AMBULATORY_CARE_PROVIDER_SITE_OTHER): Payer: Self-pay | Admitting: Physician Assistant

## 2016-10-25 ENCOUNTER — Encounter: Payer: Self-pay | Admitting: Physician Assistant

## 2016-10-25 VITALS — BP 160/80 | HR 93 | Ht 65.0 in | Wt 339.0 lb

## 2016-10-25 DIAGNOSIS — I110 Hypertensive heart disease with heart failure: Secondary | ICD-10-CM

## 2016-10-25 DIAGNOSIS — G4733 Obstructive sleep apnea (adult) (pediatric): Secondary | ICD-10-CM

## 2016-10-25 DIAGNOSIS — N184 Chronic kidney disease, stage 4 (severe): Secondary | ICD-10-CM

## 2016-10-25 DIAGNOSIS — I272 Pulmonary hypertension, unspecified: Secondary | ICD-10-CM

## 2016-10-25 DIAGNOSIS — I5032 Chronic diastolic (congestive) heart failure: Secondary | ICD-10-CM

## 2016-10-25 HISTORY — DX: Chronic kidney disease, stage 4 (severe): N18.4

## 2016-10-25 MED ORDER — LABETALOL HCL 200 MG PO TABS: 200.0000 mg | ORAL_TABLET | Freq: Two times a day (BID) | ORAL | 1 refills | Status: DC

## 2016-10-25 NOTE — Patient Instructions (Addendum)
Medication Instructions:  Increase labetalol to 212m two times a day. You can take 2 of your 102mtablets two times a day and use your current supply.  Labwork: BMET today  Testing/Procedures: None   Follow-Up: Your physician recommends that you schedule a follow-up appointment in: 2 weeks [facility]  Your physician recommends that you schedule a follow-up appointment in: 2 weeks for an EKG-should be on same day as [facility] appointment.  Your physician recommends that you schedule a follow-up hospital  [facility]ment [facility] Dr Deterding[facility] should follow up 10 days after discharge 09/23/16.  Your physician recommends that you schedule a follow-up appointment in: 3 months with Dr SkMarlou Porch Any Other Special Instructions Will Be Listed Below (If Applicable).  If you need a refill on your cardiac medications before your next appointment, please call your pharmacy.

## 2016-10-25 NOTE — Progress Notes (Signed)
Cardiology Office Note:    Date:  10/25/2016   ID:  [name] [name], DOB 1980-09-10, MRN [id number]  PCP:  Wilfred Lacy, NP  Cardiologist:  Dr. Candee Furbish (followed in the hospital)  Electrophysiologist:  n/a Pulmonologist: Dr. [name] Nephrologist: seen by Dr. Lorrene Reid in the hospital   Referring MD: Nche, Charlene Brooke, NP   Chief Complaint  Patient presents with  . Follow-up    CHF    History of Present Illness:    [name] [name] is a 36 y.o. female with a hx of HTN, OSA, obesity, gestational diabetes.  She was last seen in our office in 2014.  She was pregnant at that time and we adjusted her medications for HTN.  She saw Dr. Kirk Ruths once and never followed up.  She was admitted in 10/17 respiratory failure in the setting of HTN emergency with pulmonary edema and OHS/OSA requiring intubation.  She ultimately underwent Tracheostomy due to prolonged respiratory failure.  She did have prolonged sinus pauses during coughing and straining against the ventilator.  This was reviewed with EP and she was not felt to need a PPM as her pauses were likely related to underlying OSA/OHS. Echocardiogram did demonstrate pulmonary hypertension with PASP 42 and normal ejection fraction. Diuresis was complicated by AKI. She diuresed over 100 pounds. She was also treated for possible HCAP with empiric anabiotic therapy. She did require inpatient rehabilitation after DC.  Admitted 1/24-2/1 with acute renal failure with creatinine over 10. Acute renal failure was felt to be related to dehydration from poor oral intake and diarrhea and medications in the setting of a UTI she was followed by nephrology.  Her renal function slowly improved.  Last documented Creatinine in her chart on 09/23/16 was 5.29.    She is referred back for Cardiology follow up.  She is here with her 104 year old daughter.  She denies any chest pain or significant shortness of breath.  She sleeps with O2 and is followed by Pulmonology  for her OSA.  She denies syncope or near syncope.  She denies PND.  She denies LE edema.  She notes that her weight has been stable since DC from the hospital in 05/2016.  Of note, she was to follow up with Nephrology within 10 days after DC in 09/2016 but was never seen.  Prior CV studies:   The following studies were reviewed today:  Echo 05/26/16 EF 50-55, mild BAE, PASP 42  Renal artery duplex 10/12  Normal renal arteries bilaterally  Echo 06/17/11:  mild LVH, EF 50-55%, mild LAE.   Past Medical History:  Diagnosis Date  . Chronic diastolic heart failure ([place])    Echo 10/12: EF 50-55 // b. Echo 10/17: EF 50-55, mild BAE, PASP 42  . CKD (chronic kidney disease) stage 4, GFR 15-29 ml/min (HCC) 10/25/2016   admitted in 2/18 with SCr of 10 in the setting of dehydration from illness, etc >> improved to 5 at DC  . DM2 (diabetes mellitus, type 2) (HCC)    A1c 7.2 in 05/2016  . Gestational diabetes mellitus in pregnancy 05/24/2013  . HTN in pregnancy, chronic 05/24/2013  . Hypertensive heart disease with CHF (congestive heart failure) ([place]) 05/25/2016  . Morbid obesity ([place])   . OSA (obstructive sleep apnea) 07/09/2011   s/p Trach 2/2 prolonged respiratory failure during admx for CHF in 05/2016  . Pulmonary hypertension    secondary from CHF/OSA  . Sinus tachycardia     Past Surgical History:  Procedure Laterality Date  . TRACHEOSTOMY TUBE PLACEMENT N/A 06/11/2016   Procedure: TRACHEOSTOMY;  Surgeon: Melida Quitter, MD;  Location[facility];  Service: ENT;  Laterality: N/A;    Current Medications: Current Meds  Medication Sig  . hydrALAZINE (APRESOLINE) 100 MG tablet Take 1 tablet (100 mg total) by mouth 3 (three) times daily.  . isosorbide dinitrate (ISORDIL) 30 MG tablet Take 1 tablet (30 mg total) by mouth 3 (three) times daily.  . [DISCONTINUED] labetalol (NORMODYNE) 100 MG tablet Take 1 tablet (100 mg total) by mouth 2 (two) times daily.     Allergies:   No known allergies    Social History   Social History  . Marital status: Single    Spouse name: N/A  . Number of children: N/A  . Years of education: N/A   Occupational History  . 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 Smoking status: Never Smoker  . Smokeless tobacco: Never Used  . Alcohol use No  . Drug use: No  . Sexual activity: Not Currently    Birth control/ protection: IUD   Other Topics Concern  . None   Social History Narrative   LIVES ALONE   WORKS AT [employer] TECH   DENIES ANY TOBACCO OR ETOH USE           Family History  Problem Relation Age of Onset  . Hypertension Mother   . Diabetes Other   . Birth defects Paternal Grandfather     unknown type     ROS:   Please see the history of present illness.    ROS All other systems reviewed and are negative.   EKGs/Labs/Other Test Reviewed:    EKG:  EKG is  ordered today.  The ekg ordered today demonstrates NSR, HR 93, 1st degree AVB, PR 206, septal Q waves, QTc 460 ms  Recent Labs: 09/15/2016: B Natriuretic Peptide 175.1 09/16/2016: ALT 16 09/19/2016: Magnesium 2.1 09/20/2016: Hemoglobin 9.1; Platelets 469 09/23/2016: BUN 78; Creatinine, Ser 5.29; Potassium 3.8; Sodium 142   Recent Lipid Panel    Component Value Date/Time   CHOL 176 09/15/2016 0855   TRIG 250.0 (H) 09/15/2016 0855   HDL 10.60 (L) 09/15/2016 0855   CHOLHDL 17 09/15/2016 0855   VLDL 50.0 (H) 09/15/2016 0855   LDLCALC 92 05/26/2016 0435   LDLDIRECT 99.0 09/15/2016 0855     Physical Exam:    VS:  BP (!) 160/80 (BP Location: Right Arm)   Pulse 93   Ht _0  (1.651 m)   Wt (!) 339 lb (153.8 kg)   BMI 56.41 kg/m     Wt Readings from Last 3 Encounters:  10/25/16 (!) 339 lb (153.8 kg)  09/30/16 (!) 344 lb (156 kg)  09/28/16 (!) 343 lb 12.8 oz (155.9 kg)     Physical Exam  Constitutional: She is oriented to person, place, and time. She appears well-developed and well-nourished. No distress.  HENT:  Head:  Normocephalic and atraumatic.  Neck:  Trach in place; I cannot assess JVD  Cardiovascular: Normal rate, regular rhythm and normal heart sounds.   No murmur heard. Pulmonary/Chest: Effort normal. She has no wheezes. She has no rales.  Abdominal: Soft. There is no tenderness.  Musculoskeletal: She exhibits no edema.  Neurological: She is alert and oriented to person, place, and time.  Skin: Skin is warm and dry.  Psychiatric: She has a normal mood and affect.    ASSESSMENT:    1.  Chronic diastolic heart failure ([place])   2. Hypertensive heart disease with CHF (congestive heart failure) ([place])   3. CKD (chronic kidney disease) stage 4, GFR 15-29 ml/min (HCC)   4. Pulmonary hypertension   5. OSA (obstructive sleep apnea)   6. Morbid obesity ([place])    PLAN:    In order of problems listed above:  1. Chronic diastolic CHF - Overall, she appears stable from a volume standpoint.  She has not been on diuretics since admission in 09/2016.  Her weights have remained stable and she denies any changes in her shortness of breath.  Will get a FU BMET today.  2. HTN heart disease with CHF - BP is above goal.  Her BP tends to increase prior to her afternoon medications.  She notes BPs in the 168 systolic range at home.  I will increase her Labetalol to 200 mg bid.  Will arrange FU in the HTN clinic in 2 weeks with a repeat EKG to recheck her PR interval.  We may need to consider adding Amlodipine if BP above target.    3. CKD - No FU with Nephrology since DC in 09/2016.  Will recheck BMET today.  I will have her arrange FU with Dr. Lorrene Reid who saw her in the hospital.  4. Pulmonary HTN - Secondary.  Continue FU with Pulmonology for OSA.  5. OSA - Split night sleep study is planned by Pulmonology.  6. Obesity - She is trying to lose weight.    Medication Adjustments/Labs and Tests Ordered: Current medicines are reviewed at length with the patient today.  Concerns regarding medicines are outlined above.   Medication changes, Labs and Tests ordered today are outlined in the Patient Instructions noted below. Patient Instructions  Medication Instructions:  Increase labetalol to 240m two times a day. You can take 2 of your 1034mtablets two times a day and use your current supply.  Labwork: BMET today  Testing/Procedures: None   Follow-Up: Your physician recommends that you schedule a follow-up appointment in: 2 weeks with the Pharmacist in the [facility]  Your physician recommends that you schedule a follow-up appointment in: 2 weeks for an EKG-should be on same day as [facility] appointment.  Your physician recommends that you schedule a follow-up hospital  appointment [facility] Dr Deterding/[facility] indicates you should follow up 10 days after discharge 09/23/16.  Your physician recommends that you schedule a follow-up appointment in: 3 months with Dr SkMarlou Porch Any Other Special Instructions Will Be Listed Below (If Applicable).  If you need a refill on your cardiac medications before your next appointment, please call your pharmacy.   Return in about 3 months (around 01/25/2017) for Routine Follow Up with Dr. SkMarlou Porch  Signed, ScRichardson DoppPA-C  10/25/2016 12:21 PM    [facility] [address]  [id number]: [phone]: [phone]

## 2016-10-26 ENCOUNTER — Telehealth: Payer: Self-pay | Admitting: *Deleted

## 2016-10-26 LAB — BASIC METABOLIC PANEL
BUN/Creatinine Ratio: 14 (ref 9–23)
BUN: 19 mg/dL (ref 6–20)
CO2: 25 mmol/L (ref 18–29)
Calcium: 9.4 mg/dL (ref 8.7–10.2)
Chloride: 98 mmol/L (ref 96–106)
Creatinine, Ser: 1.38 mg/dL — ABNORMAL HIGH (ref 0.57–1.00)
GFR calc Af Amer: 56 mL/min/{1.73_m2} — ABNORMAL LOW (ref >59)
GFR calc non Af Amer: 49 mL/min/{1.73_m2} — ABNORMAL LOW (ref >59)
Glucose: 106 mg/dL — ABNORMAL HIGH (ref 65–99)
Potassium: 4.2 mmol/L (ref 3.5–5.2)
Sodium: 139 mmol/L (ref 134–144)

## 2016-10-26 NOTE — Telephone Encounter (Signed)
Pt notified of lab results from Cardiology side as well and to continue current medications at this time. Pt agreeable.

## 2016-10-28 ENCOUNTER — Ambulatory Visit (INDEPENDENT_AMBULATORY_CARE_PROVIDER_SITE_OTHER): Payer: Self-pay | Admitting: Nurse Practitioner

## 2016-10-28 ENCOUNTER — Encounter: Payer: Self-pay | Admitting: Nurse Practitioner

## 2016-10-28 DIAGNOSIS — I5032 Chronic diastolic (congestive) heart failure: Secondary | ICD-10-CM

## 2016-10-28 DIAGNOSIS — F321 Major depressive disorder, single episode, moderate: Secondary | ICD-10-CM

## 2016-10-28 DIAGNOSIS — I10 Essential (primary) hypertension: Secondary | ICD-10-CM

## 2016-10-28 MED ORDER — HYDRALAZINE HCL 100 MG PO TABS: 100.0000 mg | ORAL_TABLET | Freq: Three times a day (TID) | ORAL | 3 refills | Status: DC

## 2016-10-28 MED ORDER — ISOSORBIDE DINITRATE 30 MG PO TABS: 30.0000 mg | ORAL_TABLET | Freq: Three times a day (TID) | ORAL | 4 refills | Status: DC

## 2016-10-28 NOTE — Progress Notes (Signed)
Pre visit review using our clinic review tool, if applicable. No additional management support is needed unless otherwise documented below in the visit note. 

## 2016-10-28 NOTE — Assessment & Plan Note (Signed)
Encourage DASH diet and walking (30-50mns) daily for weight loss.

## 2016-10-28 NOTE — Progress Notes (Signed)
Subjective:  Patient ID: [name] [name], female    DOB: 1981-06-11  Age: 36 y.o. MRN: [id number]  CC: Follow-up (4 wk fu/HTN)   HPI  HTN and CHF: No chest pain, no SOB, no orthopnea. Checks weight daily. Maintains low salt diet. Labetalol increased to 240m BID by cardiologist this week. She is to f/up in 2weeks. No Exercise regimen at this time.  Tracheostomy:  Sleep study on 11/16/16. TLurline [place] capped.  Dm: Controlled without medication. Does not check glucose at home.  She lost her job 07/2016, hence has no insurance at this time. She has applied for Medicaid and waiting for reply.  Outpatient Medications Prior to Visit  Medication Sig Dispense Refill  . labetalol (NORMODYNE) 200 MG tablet Take 1 tablet (200 mg total) by mouth 2 (two) times daily. 180 tablet 1  . hydrALAZINE (APRESOLINE) 100 MG tablet Take 1 tablet (100 mg total) by mouth 3 (three) times daily. 90 tablet 3  . isosorbide dinitrate (ISORDIL) 30 MG tablet Take 1 tablet (30 mg total) by mouth 3 (three) times daily. 90 tablet 4   No facility-administered medications prior to visit.     ROS See HPI  Objective:  BP (!) 144/118   Pulse 84   Temp 97.9 F (36.6 C)   Ht _0  (1.651 m)   Wt (!) 341 lb (154.7 kg)   SpO2 95%   BMI 56.75 kg/m   BP Readings from Last 3 Encounters:  10/28/16 (!) 144/118  10/25/16 (!) 160/80  09/30/16 (!) 164/100    Wt Readings from Last 3 Encounters:  10/28/16 (!) 341 lb (154.7 kg)  10/25/16 (!) 339 lb (153.8 kg)  09/30/16 (!) 344 lb (156 kg)    Physical Exam  Constitutional: She is oriented to person, place, and time. No distress.  Eyes: No scleral icterus.  Neck: Normal range of motion. Neck supple.  Tracheostomy in place (capped).  Cardiovascular: Normal rate, regular rhythm and normal heart sounds.   Pulmonary/Chest: Effort normal and breath sounds normal. No stridor. No respiratory distress. She has no wheezes. She has no rales.  Musculoskeletal: She exhibits  edema.  Neurological: She is alert and oriented to person, place, and time.  Diabetic foot exam done today  Skin: Skin is warm and dry. No erythema.  Vitals reviewed.   Lab Results  Component Value Date   WBC 8.4 09/20/2016   HGB 9.1 (L) 09/20/2016   HCT 27.6 (L) 09/20/2016   PLT 469 (H) 09/20/2016   GLUCOSE 106 (H) 10/25/2016   CHOL 176 09/15/2016   TRIG 250.0 (H) 09/15/2016   HDL 10.60 (L) 09/15/2016   LDLDIRECT 99.0 09/15/2016   LDLCALC 92 05/26/2016   ALT 16 09/16/2016   AST 18 09/16/2016   NA 139 10/25/2016   K 4.2 10/25/2016   CL 98 10/25/2016   CREATININE 1.38 (H) 10/25/2016   BUN 19 10/25/2016   CO2 25 10/25/2016   TSH 2.615 05/14/2011   INR 1.27 09/18/2016   HGBA1C 6.2 09/15/2016    No results found.  Assessment & Plan:   [place] seen today for follow-up.  Diagnoses and all orders for this visit:  Chronic diastolic congestive heart failure (HCC) -     isosorbide dinitrate (ISORDIL) 30 MG tablet; Take 1 tablet (30 mg total) by mouth 3 (three) times daily.  Benign essential HTN -     isosorbide dinitrate (ISORDIL) 30 MG tablet; Take 1 tablet (30 mg total) by mouth 3 (three) times daily. -  hydrALAZINE (APRESOLINE) 100 MG tablet; Take 1 tablet (100 mg total) by mouth 3 (three) times daily.  Morbid obesity ([place])  Moderate single current episode of major depressive disorder ([place])   I am having Ms. Iwai maintain her labetalol, isosorbide dinitrate, and hydrALAZINE.  Meds ordered this encounter  Medications  . isosorbide dinitrate (ISORDIL) 30 MG tablet    Sig: Take 1 tablet (30 mg total) by mouth 3 (three) times daily.    Dispense:  90 tablet    Refill:  4    Future refills will need to be managed by a primary care MD    Order Specific Question:   Supervising Provider    Answer:   Cassandria Anger [[id number]]  . hydrALAZINE (APRESOLINE) 100 MG tablet    Sig: Take 1 tablet (100 mg total) by mouth 3 (three) times daily.    Dispense:  90 tablet      Refill:  3    Order Specific Question:   Supervising Provider    Answer:   Cassandria Anger [[id number]]    Follow-up: Return in about 6 months (around 04/30/2017) for CPE with PAP and breast exam (fasting), labs (cbc, cmp, lipid, a1c, tsh).  Wilfred Lacy, NP

## 2016-10-28 NOTE — Patient Instructions (Addendum)
DASH Eating Plan DASH stands for "Dietary Approaches to Stop Hypertension." The DASH eating plan is a healthy eating plan that has been shown to reduce high blood pressure (hypertension). It may also reduce your risk for type 2 diabetes, heart disease, and stroke. The DASH eating plan may also help with weight loss. What are tips for following this plan? General guidelines  Avoid eating more than 2,300 mg (milligrams) of salt (sodium) a day. If you have hypertension, you may need to reduce your sodium intake to 1,500 mg a day.  Limit alcohol intake to no more than 1 drink a day for nonpregnant women and 2 drinks a day for men. One drink equals 12 oz of beer, 5 oz of wine, or 1 oz of hard liquor.  Work with your health care provider to maintain a healthy body weight or to lose weight. Ask what an ideal weight is for you.  Get at least 30 minutes of exercise that causes your heart to beat faster (aerobic exercise) most days of the week. Activities may include walking, swimming, or biking.  Work with your health care provider or diet and nutrition specialist (dietitian) to adjust your eating plan to your individual calorie needs. Reading food labels  Check food labels for the amount of sodium per serving. Choose foods with less than 5 percent of the Daily Value of sodium. Generally, foods with less than 300 mg of sodium per serving fit into this eating plan.  To find whole grains, look for the word "whole" as the first word in the ingredient list. Shopping  Buy products labeled as "low-sodium" or "no salt added."  Buy fresh foods. Avoid canned foods and premade or frozen meals. Cooking  Avoid adding salt when cooking. Use salt-free seasonings or herbs instead of table salt or sea salt. Check with your health care provider or pharmacist before using salt substitutes.  Do not fry foods. Cook foods using healthy methods such as baking, boiling, grilling, and broiling instead.  Cook with  heart-healthy oils, such as olive, canola, soybean, or sunflower oil. Meal planning   Eat a balanced diet that includes: ? 5 or more servings of fruits and vegetables each day. At each meal, try to fill half of your plate with fruits and vegetables. ? Up to 6-8 servings of whole grains each day. ? Less than 6 oz of lean meat, poultry, or fish each day. A 3-oz serving of meat is about the same size as a deck of cards. One egg equals 1 oz. ? 2 servings of low-fat dairy each day. ? A serving of nuts, seeds, or beans 5 times each week. ? Heart-healthy fats. Healthy fats called Omega-3 fatty acids are found in foods such as flaxseeds and coldwater fish, like sardines, salmon, and mackerel.  Limit how much you eat of the following: ? Canned or prepackaged foods. ? Food that is high in trans fat, such as fried foods. ? Food that is high in saturated fat, such as fatty meat. ? Sweets, desserts, sugary drinks, and other foods with added sugar. ? Full-fat dairy products.  Do not salt foods before eating.  Try to eat at least 2 vegetarian meals each week.  Eat more home-cooked food and less restaurant, buffet, and fast food.  When eating at a restaurant, ask that your food be prepared with less salt or no salt, if possible. What foods are recommended? The items listed may not be a complete list. Talk with your dietitian about what   with your dietitian about what dietary choices are best for you. Grains  Whole-grain or whole-wheat bread. Whole-grain or whole-wheat pasta. Brown rice. [place]. Bulgur. Whole-grain and low-sodium cereals. Pita bread. Low-fat, low-sodium crackers. Whole-wheat flour tortillas. Vegetables  Fresh or frozen vegetables (raw, steamed, roasted, or grilled). Low-sodium or reduced-sodium tomato and vegetable juice. Low-sodium or reduced-sodium tomato sauce and tomato paste. Low-sodium or reduced-sodium canned vegetables. Fruits  All fresh, dried, or frozen fruit. Canned fruit in natural juice  (without added sugar). Meat and other protein foods  Skinless chicken or [place]. Ground chicken or [place]. Pork with fat trimmed off. Fish and seafood. Egg whites. Dried beans, peas, or lentils. Unsalted nuts, nut butters, and seeds. Unsalted canned beans. Lean cuts of beef with fat trimmed off. Low-sodium, lean deli meat. Dairy  Low-fat (1%) or fat-free (skim) milk. Fat-free, low-fat, or reduced-fat cheeses. Nonfat, low-sodium ricotta or cottage cheese. Low-fat or nonfat yogurt. Low-fat, low-sodium cheese. Fats and oils  Soft margarine without trans fats. Vegetable oil. Low-fat, reduced-fat, or light mayonnaise and salad dressings (reduced-sodium). Canola, safflower, olive, soybean, and sunflower oils. Avocado. Seasoning and other foods  Herbs. Spices. Seasoning mixes without salt. Unsalted popcorn and pretzels. Fat-free sweets. What foods are not recommended? The items listed may not be a complete list. Talk with your dietitian about what dietary choices are best for you. Grains  Baked goods made with fat, such as croissants, muffins, or some breads. Dry pasta or rice meal packs. Vegetables  Creamed or fried vegetables. Vegetables in a cheese sauce. Regular canned vegetables (not low-sodium or reduced-sodium). Regular canned tomato sauce and paste (not low-sodium or reduced-sodium). Regular tomato and vegetable juice (not low-sodium or reduced-sodium). Angie Fava. Olives. Fruits  Canned fruit in a light or heavy syrup. Fried fruit. Fruit in cream or butter sauce. Meat and other protein foods  Fatty cuts of meat. Ribs. Fried meat. Berniece Salines. Sausage. Bologna and other processed lunch meats. Salami. Fatback. Hotdogs. Bratwurst. Salted nuts and seeds. Canned beans with added salt. Canned or smoked fish. Whole eggs or egg yolks. Chicken or [place] with skin. Dairy  Whole or 2% milk, cream, and half-and-half. Whole or full-fat cream cheese. Whole-fat or sweetened yogurt. Full-fat cheese. Nondairy creamers.  Whipped toppings. Processed cheese and cheese spreads. Fats and oils  Butter. Stick margarine. Lard. Shortening. Ghee. Bacon fat. Tropical oils, such as coconut, palm kernel, or palm oil. Seasoning and other foods  Salted popcorn and pretzels. Onion salt, garlic salt, seasoned salt, table salt, and sea salt. Worcestershire sauce. Tartar sauce. Barbecue sauce. Teriyaki sauce. Soy sauce, including reduced-sodium. Steak sauce. Canned and packaged gravies. Fish sauce. Oyster sauce. Cocktail sauce. Horseradish that you find on the shelf. Ketchup. Mustard. Meat flavorings and tenderizers. Bouillon cubes. Hot sauce and Tabasco sauce. Premade or packaged marinades. Premade or packaged taco seasonings. Relishes. Regular salad dressings. Where to find more information:  National Heart, Lung, and [place]: https://wilson-eaton.com/  American Heart Association: www.heart.org Summary  The DASH eating plan is a healthy eating plan that has been shown to reduce high blood pressure (hypertension). It may also reduce your risk for type 2 diabetes, heart disease, and stroke.  With the DASH eating plan, you should limit salt (sodium) intake to 2,300 mg a day. If you have hypertension, you may need to reduce your sodium intake to 1,500 mg a day.  When on the DASH eating plan, aim to eat more fresh fruits and vegetables, whole grains, lean proteins, low-fat dairy, and heart-healthy fats.  Work  with your health care provider or diet and nutrition specialist (dietitian) to adjust your eating plan to your individual calorie needs. This information is not intended to replace advice given to you by your health care provider. Make sure you discuss any questions you have with your health care provider. Document Released: 07/29/2011 Document Revised: 08/02/2016 Document Reviewed: 08/02/2016 Elsevier Interactive Patient Education  2017 [employer].

## 2016-10-28 NOTE — Assessment & Plan Note (Signed)
Stable mood. No medication at this time. Reassess in 47month.

## 2016-11-09 ENCOUNTER — Ambulatory Visit (INDEPENDENT_AMBULATORY_CARE_PROVIDER_SITE_OTHER): Payer: Self-pay | Admitting: Pediatrics

## 2016-11-09 ENCOUNTER — Encounter: Payer: Self-pay | Admitting: Pharmacist

## 2016-11-09 ENCOUNTER — Ambulatory Visit: Payer: Self-pay | Admitting: Pharmacist

## 2016-11-09 DIAGNOSIS — I5032 Chronic diastolic (congestive) heart failure: Secondary | ICD-10-CM

## 2016-11-09 DIAGNOSIS — G4733 Obstructive sleep apnea (adult) (pediatric): Secondary | ICD-10-CM

## 2016-11-09 DIAGNOSIS — I272 Pulmonary hypertension, unspecified: Secondary | ICD-10-CM

## 2016-11-09 DIAGNOSIS — N184 Chronic kidney disease, stage 4 (severe): Secondary | ICD-10-CM

## 2016-11-09 DIAGNOSIS — I110 Hypertensive heart disease with heart failure: Secondary | ICD-10-CM

## 2016-11-09 MED ORDER — LABETALOL HCL 300 MG PO TABS: 300.0000 mg | ORAL_TABLET | Freq: Two times a day (BID) | ORAL | 1 refills | Status: DC

## 2016-11-09 NOTE — Progress Notes (Signed)
Patient ID: [name] [name]                 DOB: [birth year]                      MRN: [id number]     HPI: [name] [name] is a 36 y.o. female patient of Dr. Stanford Breed who presents today for hypertension evaluation. PMH includes HTN, OSA, CHF (EF 50-55%), CKD, obesity, gestational DM. She was recently seen by Richardson Dopp, PA and her pressure was elevated to 160/80. Her labetalol was increased to 277m BID.   Patient presents today and states that she has been doing well on increased dose of labetalol. She states that her pressure was the lowest it has ever been when she was pregnant with her daughter. At that time she was on labetalol 3033mBID. She states she does recall being on a diuretic once but that was stopped due to kidney problems. She reports that she does not feel fluid overload and her weights have remained stable after her hospitalization.   On her most recent BMET, her kidney function had improved significantly.   Current HTN meds:  Hydralazine 10028mID (7-8am, 330pm, 11pm) Isosorbide dinitrate 70m10mD (same as above) Labetalol 200mg34m   BP goal: <130/80  Family History: HTN in mother. Maternal Grandmother with HTN.   Social History: Denies tobacco products and alcohol.   Diet: Eat most of her meals prepared from home. She limits salt. No coffee or sodas. Occasional tea intake not daily. Mostly drinks water or cranberry juice.   Exercise: Walks (generally 20-30 minutes) 2-3 times per week.   Home BP readings: does have a monitor at home but has not checked.   Wt Readings from Last 3 Encounters:  10/28/16 (!) 341 lb (154.7 kg)  10/25/16 (!) 339 lb (153.8 kg)  09/30/16 (!) 344 lb (156 kg)   BP Readings from Last 3 Encounters:  11/09/16 (!) 178/116  10/28/16 (!) 144/118  10/25/16 (!) 160/80   Pulse Readings from Last 3 Encounters:  11/09/16 86  10/28/16 84  10/25/16 93    Renal function: Estimated Creatinine Clearance: 84.7 mL/min (A) (by C-G formula based on  SCr of 1.38 mg/dL (H)).  Past Medical History:  Diagnosis Date  . Chronic diastolic heart [place] (HCC[place] EF 50-55 // b. Echo 10/17: EF 50-55, mild BAE, PASP 42  . CKD (chronic kidney disease) stage 4, GFR 15-29 ml/min (HCC) 10/25/2016   admitted in 2/18 with SCr of 10 in the setting of dehydration from illness, etc >> improved to 5 at DC  . DM2 (diabetes mellitus, type 2) (HCC)    A1c 7.2 in 05/2016  . Gestational diabetes mellitus in pregnancy 05/24/2013  . HTN in pregnancy, chronic 05/24/2013  . Hypertensive heart disease with CHF (congestive heart failure) (HCC) [place]3/2017  . Morbid obesity (HCC) [place] OSA (obstructive sleep apnea) 07/09/2011   s/p Trach 2/2 prolonged respiratory failure during admx for CHF in 05/2016  . Pulmonary hypertension    secondary from CHF/OSA  . Sinus tachycardia     Current Outpatient Prescriptions on File Prior to Visit  Medication Sig Dispense Refill  . hydrALAZINE (APRESOLINE) 100 MG tablet Take 1 tablet (100 mg total) by mouth 3 (three) times daily. 90 tablet 3  . isosorbide dinitrate (ISORDIL) 30 MG tablet Take 1 tablet (30 mg total) by mouth 3 (three) times daily. 90 tablet 4   No current  facility-administered medications on file prior to visit.     Allergies  Allergen Reactions  . No Known Allergies     Blood pressure (!) 178/116, pulse 86, SpO2 97 %, unknown if currently breastfeeding.   Assessment/Plan: Hypertension: BP not at goal today. Patient currently without insurance and limited finances. She is pending medicaid. Since she has a supply of labetalol will increase to 350m BID due to finances. Discussed that may benefit from changing of agents or addition of amlodipine. Discussed if kidney function continued to improve we could consider ACEi/ARB. She will continue to work on diet and exercise.   Thank you, KLelan Pons APatterson Hammersmith [facility]  11/09/2016 11:13 AM

## 2016-11-09 NOTE — Progress Notes (Signed)
Patient in office today for 2 wk f/u EKG to reck PR interval per Richardson Dopp, PA-C request.  EKG was reviewed by Dr. Johnsie Cancel (DOD). He states patient ok to be discharged.

## 2016-11-09 NOTE — Patient Instructions (Signed)
Return for a follow up appointment in 2-3 weeks  Check your blood pressure at home daily (if able) and keep record of the readings.  Take your BP meds as follows: INCREASE labetalol to 359m twice daily (you may take 3 of your current supply)   Bring all of your meds, your BP cuff and your record of home blood pressures to your next appointment.  Exercise as you're able, try to walk approximately 30 minutes per day.  Keep salt intake to a minimum, especially watch canned and prepared boxed foods.  Eat more fresh fruits and vegetables and fewer canned items.  Avoid eating in fast food restaurants.    HOW TO TAKE YOUR BLOOD PRESSURE: . Rest 5 minutes before taking your blood pressure. .  Don't smoke or drink caffeinated beverages for at least 30 minutes before. . Take your blood pressure before (not after) you eat. . Sit comfortably with your back supported and both feet on the floor (don't cross your legs). . Elevate your arm to heart level on a table or a desk. . Use the proper sized cuff. It should fit smoothly and snugly around your bare upper arm. There should be enough room to slip a fingertip under the cuff. The bottom edge of the cuff should be 1 inch above the crease of the elbow. . Ideally, take 3 measurements at one sitting and record the average.

## 2016-11-10 ENCOUNTER — Telehealth: Payer: Self-pay | Admitting: *Deleted

## 2016-11-10 NOTE — Telephone Encounter (Signed)
Pt notified of EKG results read by Dr. Johnsie Cancel. Pt aware to continue current Tx plan. Pt thanked me for my call today.

## 2016-11-16 ENCOUNTER — Telehealth: Payer: Self-pay | Admitting: Pulmonary Disease

## 2016-11-16 ENCOUNTER — Ambulatory Visit (HOSPITAL_BASED_OUTPATIENT_CLINIC_OR_DEPARTMENT_OTHER): Payer: PRIVATE HEALTH INSURANCE | Attending: Pulmonary Disease | Admitting: Pulmonary Disease

## 2016-11-16 DIAGNOSIS — G4733 Obstructive sleep apnea (adult) (pediatric): Secondary | ICD-10-CM

## 2016-11-16 NOTE — Telephone Encounter (Signed)
Called and spoke with [name] at the sleep lab. He stated that he spoke with the pt today since she is coming in tonight for the sleep test. He stated that the pt is worried about being capped all night, since she never does that during the night and only does it here and there during the day.  [name] stated that he explained this to the pt and she was ok with doing it for the test. [name] wanted to give an update.  Nothing further is needed.

## 2016-11-21 ENCOUNTER — Telehealth: Payer: Self-pay | Admitting: Pulmonary Disease

## 2016-11-21 DIAGNOSIS — G4733 Obstructive sleep apnea (adult) (pediatric): Secondary | ICD-10-CM

## 2016-11-21 NOTE — Procedures (Signed)
NAME: [name] [name] DATE OF BIRTH:  09-22-1980 MEDICAL RECORD NUMBER [id number]  LOCATION: [facility]  PHYSICIAN: [place]  DATE OF STUDY: 11/16/2016   CLINICAL INFORMATION  Sleep Study Type: Split Night CPAP  Indication for sleep study: Excessive Daytime Sleepiness, Hypertension, Obesity, OSA, Snoring  Epworth Sleepiness Score: 3   SLEEP STUDY TECHNIQUE  As per the AASM Manual for the Scoring of Sleep and Associated Events v2.3 (April 2016) with a hypopnea requiring 4% desaturations.  The channels recorded and monitored were frontal, central and occipital EEG, electrooculogram (EOG), submentalis EMG (chin), nasal and oral airflow, thoracic and abdominal wall motion, anterior tibialis EMG, snore microphone, electrocardiogram, and pulse oximetry. Continuous positive airway pressure (CPAP) was initiated when the patient met split night criteria and was titrated according to treat sleep-disordered breathing.  MEDICATIONS  Medications self-administered by patient taken the night of the study : HYDRALAZINE, ISORDIL, LABETALOL   RESPIRATORY PARAMETERS  Diagnostic Total AHI (/hr):  59.1 RDI (/hr): 65.2 OA Index (/hr):  - CA Index (/hr):  0.0  REM AHI (/hr):  N/A NREM AHI (/hr):  59.1 Supine AHI (/hr):  N/A Non-supine AHI (/hr):  59.06  Min O2 Sat (%): 80.00 Mean O2 (%): 86.65 Time below 88% (min): 111.4    Titration Optimal Pressure (cm): 12 AHI at Optimal Pressure (/hr): 0.0 Min O2 at Optimal Pressure (%): 89.0  Supine % at Optimal (%): 57 Sleep % at Optimal (%): 57    SLEEP ARCHITECTURE  The recording time for the entire night was 355.2 minutes. During a baseline period of 176.3 minutes, the patient slept for 128.0 minutes in REM and nonREM, yielding a sleep efficiency of 72.6%. Sleep onset after lights out was 9.9 minutes with a REM latency of N/A minutes. The patient spent 32.81% of the night in stage N1 sleep, 67.19% in stage N2 sleep, 0.00% in  stage N3 and 0.00% in REM.  During the titration period of 173.8 minutes, the patient slept for 108.0 minutes in REM and nonREM, yielding a sleep efficiency of 62.1%. Sleep onset after CPAP initiation was 24.0 minutes with a REM latency of N/A minutes. The patient spent 35.19% of the night in stage N1 sleep, 64.81% in stage N2 sleep, 0.00% in stage N3 and 0.00% in REM.  CARDIAC DATA  The 2 lead EKG demonstrated sinus rhythm. The mean heart rate was 87.49 beats per minute. Other EKG findings include: None.   LEG MOVEMENT DATA  The total Periodic Limb Movements of Sleep (PLMS) were 0. The PLMS index was 0.00 .  IMPRESSIONS  1. Severe obstructive sleep apnea occurred during the diagnostic portion of the study (AHI = 59.1/hour). An optimal PAP pressure was selected for this patient ( 12 cm of water). This split night study was done with the patient's tracheostomy capped. 2. No significant central sleep apnea occurred during the diagnostic portion of the study (CAI = 0.0/hour). 3. Severe oxygen desaturation was noted during the diagnostic portion of the study (Min O2 = 80.00%). 4. The patient snored with Moderate snoring volume during the diagnostic portion of the study. 5. No cardiac abnormalities were noted during this study. 6. Clinically significant periodic limb movements did not occur during sleep.  DIAGNOSIS  1. Obstructive Sleep Apnea (327.23 [G47.33 ICD-10]). The study was done with the patient's trachesotomy capped.  RECOMMENDATIONS  1. Suggest trial of an autoCPAP machine set at 12 cm H2O with a Small size Philips Respironics Full  Face Mask Dreamwear mask and heated humidification. Patient was adequate on CPAP 11 cm water but still had some snoring. No REM sleep was seen during the entire sleep study. Study was done while patient's tracheostomy was capped, on room air. Patient will need a download to assess cpap efficacy as well as a nocturnal oximetry on cpap and room air to make sure no  O2 is needed with cpap. 2. Avoid alcohol, sedatives and other CNS depressants that may worsen sleep apnea and disrupt normal sleep architecture. 3. Sleep hygiene should be reviewed to assess factors that may improve sleep quality. 4. Weight management and regular exercise should be initiated or continued. 5. Follow up in the office 4-6 weeks after obtaining cpap machine.  Monica Becton, MD 11/21/2016, 12:36 AM [facility] Pager [phone] After 3 pm or if no answer, call [phone]

## 2016-11-21 NOTE — Telephone Encounter (Signed)
  Please call the pt and tell the pt the [place]  showed OSA (Her trachesotomy was capped during the study)  Pt stops breathing 59   times an hour.   Please verify with the pt if she has insurance.  If she does, pls order autocpap machine ---  Please order an autoCPAP machine set at 12 cm H2O with a Small size Philips Respironics Full Face Mask Dreamwear mask and heated humidification. Patient will need a 1 month download.  Pt will need an ONO on cpap as well.   If pt does NOT have medical insurance, pls let me know.   Patient needs to be seen by me or any of the NPs/APPs  4-6 weeks after obtaining the cpap machine.  Thanks!   J. Shirl Harris, MD 11/21/2016, 12:38 AM

## 2016-11-22 NOTE — Telephone Encounter (Signed)
AD  Gave pt her results, she had a question, she wanted to know when should she call to have her trach closed?

## 2016-11-23 ENCOUNTER — Ambulatory Visit: Payer: Self-pay | Admitting: Pulmonary Disease

## 2016-11-23 NOTE — Telephone Encounter (Signed)
Spoke with pt and made her aware of AD's message. Pt understood and understands to call us once she has received her machine for a follow up appointment. Nothing further is needed

## 2016-11-23 NOTE — Telephone Encounter (Signed)
Plan for this pt :  1. Start cpap therapy. Make sure she is tolerating cpap and is compliant. Needs a follow up 4-6 weeks after cpap therapy is initiated.   2.  If pt is compliant to cpap and OSA is corrected on cpap, we will ask her to follow up with [name] to have her trache decannulated.    Monica Becton, MD 11/23/2016, 12:22 AM [facility] Pager [phone] After 3 pm or if no answer, call [phone]

## 2016-12-01 ENCOUNTER — Ambulatory Visit (INDEPENDENT_AMBULATORY_CARE_PROVIDER_SITE_OTHER): Payer: Medicaid Other | Admitting: Pharmacist

## 2016-12-01 VITALS — BP 182/108 | HR 91 | Wt 344.0 lb

## 2016-12-01 DIAGNOSIS — I110 Hypertensive heart disease with heart failure: Secondary | ICD-10-CM | POA: Diagnosis not present

## 2016-12-01 MED ORDER — CARVEDILOL 25 MG PO TABS: 25.0000 mg | ORAL_TABLET | Freq: Two times a day (BID) | ORAL | 3 refills | Status: DC

## 2016-12-01 NOTE — Patient Instructions (Signed)
Return for a follow up appointment in 2-3 weeks   Check your blood pressure at home daily (if able) and keep record of the readings.  Take your BP meds as follows: STOP labetalol  START carvedilol 24m TWICE a day  Bring all of your meds, your BP cuff and your record of home blood pressures to your next appointment.  Exercise as you're able, try to walk approximately 30 minutes per day.  Keep salt intake to a minimum, especially watch canned and prepared boxed foods.  Eat more fresh fruits and vegetables and fewer canned items.  Avoid eating in fast food restaurants.    HOW TO TAKE YOUR BLOOD PRESSURE: . Rest 5 minutes before taking your blood pressure. .  Don't smoke or drink caffeinated beverages for at least 30 minutes before. . Take your blood pressure before (not after) you eat. . Sit comfortably with your back supported and both feet on the floor (don't cross your legs). . Elevate your arm to heart level on a table or a desk. . Use the proper sized cuff. It should fit smoothly and snugly around your bare upper arm. There should be enough room to slip a fingertip under the cuff. The bottom edge of the cuff should be 1 inch above the crease of the elbow. . Ideally, take 3 measurements at one sitting and record the average.

## 2016-12-01 NOTE — Progress Notes (Signed)
Patient ID: [name] [name]                 DOB: 1981/03/22                      MRN: [id number]     HPI: [name] [name] is a 36 y.o. female patient of Dr. Stanford Breed who presents today for hypertension evaluation. PMH includes HTN, OSA, CHF (EF 50-55%), CKD, obesity, gestational DM. She was recently seen by Richardson Dopp, PA and her pressure was elevated to 160/80. She recalled being on diuretic at one time but this was stopped due to kidney problems. At her last visit her labetalol was increased to 370m BID due to no prescription coverage and unable to afford a medication change at that time.  She was pending medicaid at that time.   On her most recent BMET, her kidney function had improved significantly. She presents today stating that she has been doing well. She has been approved for medicaid and she is eager to get her blood pressure lower. She is also working to lose weight.   Current HTN meds:  Hydralazine 1065mTID (7-8am, 330pm, 11pm) Isosorbide dinitrate 3041mID (same as above) Labetalol 300m44mD   BP goal: <130/80  Family History: HTN in mother. Maternal Grandmother with HTN.   Social History: Denies tobacco products and alcohol.   Diet: Eat most of her meals prepared from home. She limits salt. No coffee or sodas. Occasional tea intake not daily. Mostly drinks water or cranberry juice.   Exercise: Walks (generally 20-30 minutes) 2-3 times per week.   Home BP readings: does have a monitor at home but has not checked.   Wt Readings from Last 3 Encounters:  12/01/16 (!) 344 lb (156 kg)  11/16/16 (!) 343 lb (155.6 kg)  11/09/16 (!) 343 lb 8 oz (155.8 kg)   BP Readings from Last 3 Encounters:  12/01/16 (!) 182/108  11/09/16 (!) 178/116  11/09/16 (!) 178/116   Pulse Readings from Last 3 Encounters:  12/01/16 91  11/09/16 86  11/09/16 86    Renal function: CrCl cannot be calculated (Patient's most recent lab result is older than the maximum 21 days allowed.).  Past  Medical History:  Diagnosis Date  . Chronic diastolic heart failure (HCC)[place] Echo 10/12: EF 50-55 // b. Echo 10/17: EF 50-55, mild BAE, PASP 42  . CKD (chronic kidney disease) stage 4, GFR 15-29 ml/min (HCC) 10/25/2016   admitted in 2/18 with SCr of 10 in the setting of dehydration from illness, etc >> improved to 5 at DC  . DM2 (diabetes mellitus, type 2) (HCC)    A1c 7.2 in 05/2016  . Gestational diabetes mellitus in pregnancy 05/24/2013  . HTN in pregnancy, chronic 05/24/2013  . Hypertensive heart disease with CHF (congestive heart failure) (HCC)[place]/10/2015  . Morbid obesity (HCC)[place]. OSA (obstructive sleep apnea) 07/09/2011   s/p Trach 2/2 prolonged respiratory failure during admx for CHF in 05/2016  . Pulmonary hypertension    secondary from CHF/OSA  . Sinus tachycardia     Current Outpatient Prescriptions on File Prior to Visit  Medication Sig Dispense Refill  . hydrALAZINE (APRESOLINE) 100 MG tablet Take 1 tablet (100 mg total) by mouth 3 (three) times daily. 90 tablet 3  . isosorbide dinitrate (ISORDIL) 30 MG tablet Take 1 tablet (30 mg total) by mouth 3 (three) times daily. 90 tablet 4   No current facility-administered medications on  file prior to visit.     Allergies  Allergen Reactions  . No Known Allergies     Blood pressure (!) 182/108, pulse 91, weight (!) 344 lb (156 kg), unknown if currently breastfeeding.   Assessment/Plan: Hypertension: BP not at goal today and remains markedly elevated will change labetalol to carvedilol 10m BID. Discussed addition of amlodipine or ACEi/ARB if kidney function remains stable and patient is agreeable. Also educated on need to change medications again if becomes pregnant. She does not plan to conceive and will use contraception.   Thank you, KLelan Pons APatterson Hammersmith [facility]  12/01/2016 5:09 PM

## 2016-12-14 ENCOUNTER — Ambulatory Visit: Payer: Self-pay | Admitting: Nurse Practitioner

## 2016-12-15 ENCOUNTER — Ambulatory Visit (INDEPENDENT_AMBULATORY_CARE_PROVIDER_SITE_OTHER): Payer: Medicaid Other | Admitting: Pharmacist

## 2016-12-15 ENCOUNTER — Ambulatory Visit (HOSPITAL_COMMUNITY): Payer: Self-pay

## 2016-12-15 VITALS — BP 178/82 | HR 97

## 2016-12-15 DIAGNOSIS — I110 Hypertensive heart disease with heart failure: Secondary | ICD-10-CM | POA: Diagnosis not present

## 2016-12-15 MED ORDER — AMLODIPINE BESYLATE 5 MG PO TABS: 5.0000 mg | ORAL_TABLET | Freq: Every day | ORAL | 3 refills | Status: DC

## 2016-12-15 NOTE — Patient Instructions (Addendum)
Return for a follow up appointment in 4-5 weeks  Check your blood pressure at home daily (if able) and keep record of the readings.  Take your BP meds as follows: Start amlodipine 67m once daily   Continue all other medications as prescribed  AVOID decongestant products (phenylephrine, pseudoephedrine)  You can take dextromethorphan for cough.   Bring all of your meds, your BP cuff and your record of home blood pressures to your next appointment.  Exercise as you're able, try to walk approximately 30 minutes per day.  Keep salt intake to a minimum, especially watch canned and prepared boxed foods.  Eat more fresh fruits and vegetables and fewer canned items.  Avoid eating in fast food restaurants.    HOW TO TAKE YOUR BLOOD PRESSURE: . Rest 5 minutes before taking your blood pressure. .  Don't smoke or drink caffeinated beverages for at least 30 minutes before. . Take your blood pressure before (not after) you eat. . Sit comfortably with your back supported and both feet on the floor (don't cross your legs). . Elevate your arm to heart level on a table or a desk. . Use the proper sized cuff. It should fit smoothly and snugly around your bare upper arm. There should be enough room to slip a fingertip under the cuff. The bottom edge of the cuff should be 1 inch above the crease of the elbow. . Ideally, take 3 measurements at one sitting and record the average.

## 2016-12-15 NOTE — Progress Notes (Signed)
Patient ID: [name] [name]                 DOB: February 15, 1981                      MRN: [id number]     HPI: [name] [name] is a 36 y.o. female patient of [name] [name] who presents today for hypertension evaluation. PMH includes HTN, OSA, CHF (EF 50-55%), CKD, obesity, gestational DM. She was recently seen by [name] Dopp, PA and her pressure was elevated to 160/80. She recalled being on diuretic at one time but this was stopped due to kidney problems. At her last visit her labetalol was changed to carvedilol 37m BID for better blood pressure control. A full discussion of risks if become pregnant was carried out and she is using appropriate birth control. She is aware to call immediately if she were to become pregnant. She was also approved for medicaid, which will assist with better coverage of medications.   She presents today for follow up with her daughter. She states she has done well with change from labetalol to carvedilol. She is struggling with a cough currently. She has been taking mucinex with no relief. She Requests alternate over the counter medications to help with cough.   She has not checked her blood pressure since her last visit.    Current HTN meds:  Hydralazine 1032mTID (7-8am, 330pm, 11pm) Isosorbide dinitrate 3065mID (same as above) Carvedilol 33m55mD  BP goal: <130/80  Family History: HTN in mother. Maternal Grandmother with HTN.   Social History: Denies tobacco products and alcohol.   Diet: Eat most of her meals prepared from home. She limits salt. No coffee or sodas. Occasional tea intake not daily. Mostly drinks water or cranberry juice.   Exercise: Walks (generally 20-30 minutes) 2-3 times per week.   Home BP readings: does have a monitor at home but has not checked.   Wt Readings from Last 3 Encounters:  12/01/16 (!) 344 lb (156 kg)  11/16/16 (!) 343 lb (155.6 kg)  11/09/16 (!) 343 lb 8 oz (155.8 kg)   BP Readings from Last 3 Encounters:  12/15/16 (!)  178/82  12/01/16 (!) 182/108  11/09/16 (!) 178/116   Pulse Readings from Last 3 Encounters:  12/15/16 97  12/01/16 91  11/09/16 86    Renal function: CrCl cannot be calculated (Patient's most recent lab result is older than the maximum 21 days allowed.).  Past Medical History:  Diagnosis Date  . Chronic diastolic heart failure (HCC)[place] Echo 10/12: EF 50-55 // b. Echo 10/17: EF 50-55, mild BAE, PASP 42  . CKD (chronic kidney disease) stage 4, GFR 15-29 ml/min (HCC) 10/25/2016   admitted in 2/18 with SCr of 10 in the setting of dehydration from illness, etc >> improved to 5 at DC  . DM2 (diabetes mellitus, type 2) (HCC)    A1c 7.2 in 05/2016  . Gestational diabetes mellitus in pregnancy 05/24/2013  . HTN in pregnancy, chronic 05/24/2013  . Hypertensive heart disease with CHF (congestive heart failure) (HCC)[place]/10/2015  . Morbid obesity (HCC)[place]. OSA (obstructive sleep apnea) 07/09/2011   s/p Trach 2/2 prolonged respiratory failure during admx for CHF in 05/2016  . Pulmonary hypertension (HCC)[place] secondary from CHF/OSA  . Sinus tachycardia     Current Outpatient Prescriptions on File Prior to Visit  Medication Sig Dispense Refill  . carvedilol (COREG) 25 MG tablet Take  1 tablet (25 mg total) by mouth 2 (two) times daily. 180 tablet 3  . hydrALAZINE (APRESOLINE) 100 MG tablet Take 1 tablet (100 mg total) by mouth 3 (three) times daily. 90 tablet 3  . isosorbide dinitrate (ISORDIL) 30 MG tablet Take 1 tablet (30 mg total) by mouth 3 (three) times daily. 90 tablet 4   No current facility-administered medications on file prior to visit.     Allergies  Allergen Reactions  . No Known Allergies     Blood pressure (!) 178/82, pulse 97, unknown if currently breastfeeding.   Assessment/Plan: Hypertension: BP remains above goal, but is slightly improved today on carvedilol. Will start her on amlodipine 80m daily in addition to current therapy. Given comorbidities would recommend  addition of ACEi/ARB if kidney function remains stable at next visit. Follow up with Dr. SMarlou Porchas scheduled then HTN clinic after for additional medication titration.    Thank you, KLelan Pons APatterson Hammersmith [facility]  12/15/2016 12:22 PM

## 2016-12-22 ENCOUNTER — Ambulatory Visit (HOSPITAL_COMMUNITY)
Admission: RE | Admit: 2016-12-22 | Discharge: 2016-12-22 | Disposition: A | Discharge status: Home / Self Care | Payer: Medicaid Other | Source: Ambulatory Visit | Attending: Acute Care | Admitting: Acute Care

## 2016-12-22 ENCOUNTER — Other Ambulatory Visit: Payer: Self-pay | Admitting: Acute Care

## 2016-12-22 DIAGNOSIS — G4733 Obstructive sleep apnea (adult) (pediatric): Secondary | ICD-10-CM

## 2016-12-22 DIAGNOSIS — J411 Mucopurulent chronic bronchitis: Secondary | ICD-10-CM | POA: Insufficient documentation

## 2016-12-22 DIAGNOSIS — Z930 Tracheostomy status: Secondary | ICD-10-CM | POA: Diagnosis not present

## 2016-12-22 DIAGNOSIS — I5032 Chronic diastolic (congestive) heart failure: Secondary | ICD-10-CM | POA: Diagnosis present

## 2016-12-22 DIAGNOSIS — J209 Acute bronchitis, unspecified: Secondary | ICD-10-CM | POA: Insufficient documentation

## 2016-12-22 MED ORDER — AMOXICILLIN-POT CLAVULANATE 875-125 MG PO TABS: 1.0000 | ORAL_TABLET | Freq: Two times a day (BID) | ORAL | 0 refills | Status: DC

## 2016-12-22 MED ORDER — FLUTICASONE PROPIONATE 50 MCG/ACT NA SUSP
2.0000 | Freq: Two times a day (BID) | NASAL | 2 refills | Status: DC
Start: 2016-12-22 — End: 2018-11-08

## 2016-12-22 MED ORDER — CETIRIZINE HCL 10 MG PO TABS: 10.0000 mg | ORAL_TABLET | Freq: Every day | ORAL | Status: DC

## 2016-12-22 NOTE — Progress Notes (Signed)
Subjective:  ROV    Patient ID: [name] [name], female    DOB: Jan 01, 1981, 36 y.o.   MRN: [id number]  HPI   [name] [name] returns today for evaluation and possible decannulation and also just for routine f/u. Since our last visit she has had her sleep study and CPAP delivered at home. She has struggled w/ the CPAP machine as it has required fairly high pressure thus she has not been using it routinely. She also tells me that she has had increased nasal congestion and worsening cough. She felt that this was initially just allergies and was seen by her PCP who instructed her to start mucinex. Unfortunately symptoms have gotten worse and now her sputum is yellow colored, and she is more short of breath.   Review of Systems  Constitutional: Negative.   HENT: Positive for congestion, postnasal drip, rhinorrhea, sinus pain, sinus pressure, sore throat and voice change.   Eyes: Negative.   Respiratory: Positive for cough and shortness of breath.   Cardiovascular: Negative.   Gastrointestinal: Negative.   Endocrine: Negative.   Genitourinary: Negative.   Musculoskeletal: Negative.   Allergic/Immunologic: Negative.   Neurological: Negative.   Hematological: Negative.   Psychiatric/Behavioral: Negative.    180/102, pulse 97, respirations 24 and pulse oximetry 90%    Objective:   Physical Exam  Constitutional: She is oriented to person, place, and time. She appears well-developed and well-nourished. No distress.  HENT:  Head: Normocephalic and atraumatic.  Mouth/Throat: No oropharyngeal exudate.  Eyes: Conjunctivae are normal. Pupils are equal, round, and reactive to light.  Neck: Normal range of motion. Neck supple.  Trach site w/ thick yellow secretions   Cardiovascular: Normal rate and regular rhythm.   Pulmonary/Chest: Effort normal. No respiratory distress. She has rhonchi in the right upper field, the right middle field, the right lower field, the left upper field, the left middle field  and the left lower field.  Abdominal: Soft. Normal appearance. There is no tenderness.  Neurological: She is alert and oriented to person, place, and time. She has normal strength. No cranial nerve deficit or sensory deficit.  Skin: Skin is warm, dry and intact. She is not diaphoretic.    Trach changed:   Karn Cassis: [id number] Size: 5.0 Style: Uncuffed Secured by: Velcro      Assessment & Plan:  Tracheostomy dependence  OHS/OSA Acute Purulent bronchitis  environmental allergies  Chronic diastolic HF  Discussion [name] [name] has her CPAP now since early April. She has been only intermittently using it as she feels the pressure has been a little overwhelming. She and I discussed that we cannot remove her trach until we have a optimal plan w/ her CPAP. She understands this.  Her sats were 88% and she did have thick yellow tracheal aspirate. We will see her again in 5 weeks.   Plan augmentin BID X 8d Zyrtec daily flonase bid ROV 5 weeks.  Do not decannulate until CPAP regimen is one that she can be comfortable and compliant with  My time has been  Erick Colace ACNP-BC [place] Pager # [phone] OR # [phone] if no answer

## 2016-12-22 NOTE — Progress Notes (Signed)
Tracheostomy Procedure Note  Layonna Dobie [id number] 11-Aug-1981  Pre Procedure Tracheostomy Information  Trach Brand: bivona Size: 5.0 Style: Uncuffed Secured by: Velcro   Procedure: trach change    Post Procedure Tracheostomy Information  Trach Brand: bivona Size: 5.0 Style: Uncuffed Secured by: Velcro   Post Procedure Evaluation:  ETCO2 positive color change from yellow to purple : Yes.   Vital signs:blood pressure 180/102, pulse 97, respirations 24 and pulse oximetry 90% Patients current condition: stable Complications: No apparent complications Trach site exam: clean, dry Wound care done: 4 x 4 gauze Patient did tolerate procedure well.   Education: none  Prescription needs: Antibiotics, allergy medication    Additional needs: none

## 2016-12-27 ENCOUNTER — Ambulatory Visit: Payer: Self-pay | Admitting: Adult Health

## 2016-12-29 ENCOUNTER — Encounter: Payer: Self-pay | Admitting: Adult Health

## 2016-12-29 ENCOUNTER — Ambulatory Visit (INDEPENDENT_AMBULATORY_CARE_PROVIDER_SITE_OTHER): Payer: Medicaid Other | Admitting: Adult Health

## 2016-12-29 DIAGNOSIS — G4733 Obstructive sleep apnea (adult) (pediatric): Secondary | ICD-10-CM | POA: Diagnosis not present

## 2016-12-29 DIAGNOSIS — J9612 Chronic respiratory failure with hypercapnia: Secondary | ICD-10-CM

## 2016-12-29 NOTE — Patient Instructions (Addendum)
Your blood pressure is elevated, will need follow-up to primary care provider for this Retry CPAP At bedtime w/ trach capped .  -will change to auto set .  If not wearing CPAP , will need to un-cap Trach and use Trach collar At bedtime  .  Finish Augmentin .  Follow up in 6 -8 weeks with CPAP download with Dr. Elsworth Soho   Please contact office for sooner follow up if symptoms do not improve or worsen or seek emergency care

## 2016-12-29 NOTE — Progress Notes (Signed)
_0  ID: [name] [name], female    DOB: 1981/08/12, 36 y.o.   MRN: [id number]  Chief Complaint  Patient presents with  . Follow-up    OSA     Referring provider: Flossie Buffy, NP  HPI: 36 year old female followed for obstructive sleep apnea She was diagnosed with sleep apnea October 2012 with an AHI at 73. Hospital admission October 2017 with critical illness requiring tracheostomy.  11/16/2016 sleep study showed severe sleep apnea, AHI 59, optimal C Pap pressure was 12 cm of H2O (done with capped trach ) .   12/29/2016 Follow up ; OSA  Patient returns for a two-month follow-up. Patient has known underlying severe sleep apnea. Patient had a tracheostomy after critical illness October 2017. Patient recent he had a sleep study 11/16/2016 that showed she had severe sleep apnea with AHI of 59. The study was done with her tracheostomy capped. Firstly patient says that she's been unable to wear her C Pap. She feels that the pressure is way too high. She feels uncomfortable. We discussed that she would not be able to be be cannulated until we can get her more comfortable with her C Pap.  Patient has also been having some increased cough, congestion and drainage. Patient was started on Augmentin. She has a few days left. Denies any fever, chest pain, orthopnea, PND, or increased leg swelling  Allergies  Allergen Reactions  . No Known Allergies     Immunization History  Administered Date(s) Administered  . Influenza,inj,Quad PF,36+ Mos 06/30/2016    Past Medical History:  Diagnosis Date  . Chronic diastolic heart failure ([facility])    Echo 10/12: EF 50-55 // b. Echo 10/17: EF 50-55, mild BAE, PASP 42  . CKD (chronic kidney disease) stage 4, GFR 15-29 ml/min (HCC) 10/25/2016   admitted in 2/18 with SCr of 10 in the setting of dehydration from illness, etc >> improved to 5 at DC  . DM2 (diabetes mellitus, type 2) (HCC)    A1c 7.2 in 05/2016  . Gestational diabetes mellitus in  pregnancy 05/24/2013  . HTN in pregnancy, chronic 05/24/2013  . Hypertensive heart disease with CHF (congestive heart failure) ([place]) 05/25/2016  . Morbid obesity ([place])   . OSA (obstructive sleep apnea) 07/09/2011   s/p Trach 2/2 prolonged respiratory failure during admx for CHF in 05/2016  . Pulmonary hypertension ([place])    secondary from CHF/OSA  . Sinus tachycardia     Tobacco History: History  Smoking Status  . Never Smoker  Smokeless Tobacco  . Never Used   Counseling given: Not Answered   Outpatient Encounter Prescriptions as of 12/29/2016  Medication Sig  . amLODipine (NORVASC) 5 MG tablet Take 1 tablet (5 mg total) by mouth daily.  Marland Kitchen amoxicillin-clavulanate (AUGMENTIN) 875-125 MG tablet Take 1 tablet by mouth 2 (two) times daily.  . carvedilol (COREG) 25 MG tablet Take 1 tablet (25 mg total) by mouth 2 (two) times daily.  . fluticasone (FLONASE) 50 MCG/ACT nasal spray Place 2 sprays into both nostrils 2 (two) times daily.  . hydrALAZINE (APRESOLINE) 100 MG tablet Take 1 tablet (100 mg total) by mouth 3 (three) times daily.  . isosorbide dinitrate (ISORDIL) 30 MG tablet Take 1 tablet (30 mg total) by mouth 3 (three) times daily.   Facility-Administered Encounter Medications as of 12/29/2016  Medication  . cetirizine (ZYRTEC) tablet 10 mg     Review of Systems  Constitutional:   No  weight loss, night sweats,  Fevers, chills, fatigue, or  lassitude.  HEENT:   No headaches,  Difficulty swallowing,  Tooth/dental problems, or  Sore throat,                No sneezing, itching, ear ache, nasal congestion, post nasal drip,   CV:  No chest pain,  Orthopnea, PND, swelling in lower extremities, anasarca, dizziness, palpitations, syncope.   GI  No heartburn, indigestion, abdominal pain, nausea, vomiting, diarrhea, change in bowel habits, loss of appetite, bloody stools.   Resp:    No chest wall deformity  Skin: no rash or lesions.  GU: no dysuria, change in color of urine, no  urgency or frequency.  No flank pain, no hematuria   MS:  No joint pain or swelling.  No decreased range of motion.  No back pain.    Physical Exam  Pulse 95   Ht 5' 5" (1.651 m)   Wt (!) 354 lb 12.8 oz (160.9 kg)   SpO2 93%   BMI 59.04 kg/m   GEN: A/Ox3; pleasant , NAD, obese    HEENT:  [place]/AT,  EACs-clear, TMs-wnl, NOSE-clear, THROAT-clear, no lesions, no postnasal drip or exudate noted.   NECK:  Supple w/ fair ROM; no JVD; normal carotid impulses w/o bruits; no thyromegaly or nodules palpated; no lymphadenopathy.  Trach midline , dsg clean and dry   RESP  Clear  P & A; w/o, wheezes/ rales/ or rhonchi. no accessory muscle use, no dullness to percussion  CARD:  RRR, no m/r/g, tr  peripheral edema, pulses intact, no cyanosis or clubbing.  GI:   Soft & nt; nml bowel sounds; no organomegaly or masses detected.   Musco: Warm bil, no deformities or joint swelling noted.   Neuro: alert, no focal deficits noted.    Skin: Warm, no lesions or rashes    Lab Results:  BMET  ProBNP No results found for: PROBNP  Imaging: No results found.   Assessment & Plan:   OSA (obstructive sleep apnea) Severe OSA - Try to restart CPAP , change to autoset to see if more comfortable   Plan  Patient Instructions  Your blood pressure is elevated, will need follow-up to primary care provider for this Retry CPAP At bedtime w/ trach capped .  -will change to auto set .  If not wearing CPAP , will need to un-cap Trach and use Trach collar At bedtime  .  Finish Augmentin .  Follow up in 6 -8 weeks with CPAP download with Dr. Elsworth Soho   Please contact office for sooner follow up if symptoms do not improve or worsen or seek emergency care      Chronic respiratory failure with hypercapnia ([place]) Cont w/ Trach care  Try CPAP At bedtime   Unable to decannulate until tolerates CPAP   Plan  Patient Instructions  Your blood pressure is elevated, will need follow-up to primary care provider  for this Retry CPAP At bedtime w/ trach capped .  -will change to auto set .  If not wearing CPAP , will need to un-cap Trach and use Trach collar At bedtime  .  Finish Augmentin .  Follow up in 6 -8 weeks with CPAP download with Dr. Elsworth Soho   Please contact office for sooner follow up if symptoms do not improve or worsen or seek emergency care         [name], NP 12/29/2016

## 2016-12-29 NOTE — Assessment & Plan Note (Signed)
Cont w/ Trach care  Try CPAP At bedtime   Unable to decannulate until tolerates CPAP   Plan  Patient Instructions  Your blood pressure is elevated, will need follow-up to primary care provider for this Retry CPAP At bedtime w/ trach capped .  -will change to auto set .  If not wearing CPAP , will need to un-cap Trach and use Trach collar At bedtime  .  Finish Augmentin .  Follow up in 6 -8 weeks with CPAP download with Dr. Elsworth Soho   Please contact office for sooner follow up if symptoms do not improve or worsen or seek emergency care

## 2016-12-29 NOTE — Assessment & Plan Note (Signed)
Severe OSA - Try to restart CPAP , change to autoset to see if more comfortable   Plan  Patient Instructions  Your blood pressure is elevated, will need follow-up to primary care provider for this Retry CPAP At bedtime w/ trach capped .  -will change to auto set .  If not wearing CPAP , will need to un-cap Trach and use Trach collar At bedtime  .  Finish Augmentin .  Follow up in 6 -8 weeks with CPAP download with Dr. Elsworth Soho   Please contact office for sooner follow up if symptoms do not improve or worsen or seek emergency care

## 2016-12-30 NOTE — Addendum Note (Signed)
Addended by: Parke Poisson E on: 12/30/2016 09:19 AM   Modules accepted: Orders

## 2016-12-30 NOTE — Addendum Note (Signed)
Addended by: Parke Poisson E on: 12/30/2016 09:17 AM   Modules accepted: Orders

## 2017-01-26 ENCOUNTER — Encounter: Payer: Self-pay | Admitting: Cardiology

## 2017-01-26 ENCOUNTER — Ambulatory Visit (INDEPENDENT_AMBULATORY_CARE_PROVIDER_SITE_OTHER): Payer: Medicaid Other | Admitting: Cardiology

## 2017-01-26 VITALS — BP 148/88 | HR 92 | Ht 65.0 in | Wt 365.0 lb

## 2017-01-26 DIAGNOSIS — I272 Pulmonary hypertension, unspecified: Secondary | ICD-10-CM | POA: Diagnosis not present

## 2017-01-26 DIAGNOSIS — I110 Hypertensive heart disease with heart failure: Secondary | ICD-10-CM

## 2017-01-26 DIAGNOSIS — I5032 Chronic diastolic (congestive) heart failure: Secondary | ICD-10-CM | POA: Diagnosis not present

## 2017-01-26 MED ORDER — FUROSEMIDE 20 MG PO TABS: 20.0000 mg | ORAL_TABLET | Freq: Every day | ORAL | 6 refills | Status: DC

## 2017-01-26 NOTE — Patient Instructions (Signed)
Medication Instructions:  Please start Furosemide 20 mg a day. Continue all other medications as listed.  Follow-Up: Follow up in 3 months with Richardson Dopp, PA.  If you need a refill on your cardiac medications before your next appointment, please call your pharmacy.  Thank you for choosing [facility]!!

## 2017-01-26 NOTE — Progress Notes (Signed)
Cardiology Office Note:    Date:  01/26/2017   ID:  [name] [name], DOB Aug 22, 1981, MRN [id number]  PCP:  Flossie Buffy, NP  Cardiologist:  Candee Furbish, MD   Referring MD: Flossie Buffy, NP     History of Present Illness:    [name] [name] is a 36 y.o. female with Chronic diastolic heart failure, tracheostomy from obesity hypoventilation syndrome, hypertensive heart disease with heart failure here for follow-up.  She had prolonged pauses in the hospital during Valsalva-like maneuvers, coughing and straining. Electrophysiology reviewed, did not feel as though pacemaker would be needed, pulses were related to underlying OSA/obesity hypoventilation syndrome.  Echocardiogram showed PA pressures of 42 with normal ejection fraction. Aggressive diuresis was, located by acute kidney injury. She was diuresed at one point over 100 pounds. She had creatinine of 10 at one point in the setting of urinary tract infection and acute kidney injury.  She is here again with her 61-year-old daughter. No syncope, no chest pain, no changes in shortness of breath.  Overall she is starting to gain back some weight over 20 pounds over the past 2 months. See below.  Past Medical History:  Diagnosis Date  . Chronic diastolic heart failure ([place])    Echo 10/12: EF 50-55 // b. Echo 10/17: EF 50-55, mild BAE, PASP 42  . CKD (chronic kidney disease) stage 4, GFR 15-29 ml/min (HCC) 10/25/2016   admitted in 2/18 with SCr of 10 in the setting of dehydration from illness, etc >> improved to 5 at DC  . DM2 (diabetes mellitus, type 2) (HCC)    A1c 7.2 in 05/2016  . Gestational diabetes mellitus in pregnancy 05/24/2013  . HTN in pregnancy, chronic 05/24/2013  . Hypertensive heart disease with CHF (congestive heart failure) ([place]) 05/25/2016  . Morbid obesity ([place])   . OSA (obstructive sleep apnea) 07/09/2011   s/p Trach 2/2 prolonged respiratory failure during admx for CHF in 05/2016  . Pulmonary hypertension  ([place])    secondary from CHF/OSA  . Sinus tachycardia     Past Surgical History:  Procedure Laterality Date  . TRACHEOSTOMY TUBE PLACEMENT N/A 06/11/2016   Procedure: TRACHEOSTOMY;  Surgeon: Melida Quitter, MD;  Location: [facility];  Service: ENT;  Laterality: N/A;    Current Medications: Current Meds  Medication Sig  . amLODipine (NORVASC) 5 MG tablet Take 1 tablet (5 mg total) by mouth daily.  Marland Kitchen amoxicillin-clavulanate (AUGMENTIN) 875-125 MG tablet Take 1 tablet by mouth 2 (two) times daily.  . carvedilol (COREG) 25 MG tablet Take 1 tablet (25 mg total) by mouth 2 (two) times daily.  . fluticasone (FLONASE) 50 MCG/ACT nasal spray Place 2 sprays into both nostrils 2 (two) times daily.  . hydrALAZINE (APRESOLINE) 100 MG tablet Take 1 tablet (100 mg total) by mouth 3 (three) times daily.  . isosorbide dinitrate (ISORDIL) 30 MG tablet Take 1 tablet (30 mg total) by mouth 3 (three) times daily.   Current Facility-Administered Medications for the 01/26/17 encounter (Office Visit) with Jerline Pain, MD  Medication  . cetirizine (ZYRTEC) tablet 10 mg     Allergies:   No known allergies   Social History   Social History  . Marital status: Single    Spouse name: N/A  . Number of children: N/A  . Years of education: N/A   Occupational History  . 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 Smoking status: Never Smoker  . Smokeless tobacco: Never Used  .  Alcohol use No  . Drug use: No  . Sexual activity: Not Currently    Birth control/ protection: IUD   Other Topics Concern  . None   Social History Narrative   LIVES ALONE   WORKS AT [employer] TECH   DENIES ANY TOBACCO OR ETOH USE           Family History: The patient's family history includes Birth defects in her paternal grandfather; Diabetes in her other; Hypertension in her mother. ROS:   Please see the history of present illness.     All other systems reviewed and are  negative.  EKGs/Labs/Other Studies Reviewed:    The following studies were reviewed today:  Echo 05/26/16 EF 50-55, mild BAE, PASP 42  Renal artery duplex 10/12  Normal renal arteries bilaterally  Echo 06/17/11:  mild LVH, EF 50-55%, mild LAE.   EKG:  None today  Recent Labs: 09/15/2016: B Natriuretic Peptide 175.1 09/16/2016: ALT 16 09/19/2016: Magnesium 2.1 09/20/2016: Hemoglobin 9.1; Platelets 469 10/25/2016: BUN 19; Creatinine, Ser 1.38; Potassium 4.2; Sodium 139   Recent Lipid Panel    Component Value Date/Time   CHOL 176 09/15/2016 0855   TRIG 250.0 (H) 09/15/2016 0855   HDL 10.60 (L) 09/15/2016 0855   CHOLHDL 17 09/15/2016 0855   VLDL 50.0 (H) 09/15/2016 0855   LDLCALC 92 05/26/2016 0435   LDLDIRECT 99.0 09/15/2016 0855    Physical Exam:    VS:  BP (!) 148/88   Pulse 92   Ht _0  (1.651 m)   Wt (!) 365 lb (165.6 kg)   LMP  (LMP Unknown)   BMI 60.74 kg/m     Wt Readings from Last 3 Encounters:  01/26/17 (!) 365 lb (165.6 kg)  12/29/16 (!) 354 lb 12.8 oz (160.9 kg)  12/01/16 (!) 344 lb (156 kg)     GEN:  Well nourished, well developed in no acute distress HEENT: Normal NECK: No JVD; No carotid bruits, Tracheostomy in place LYMPHATICS: No lymphadenopathy CARDIAC: RRR, no murmurs, rubs, gallops RESPIRATORY:  Clear to auscultation without rales, wheezing or rhonchi  ABDOMEN: Soft, non-tender, non-distended, obese MUSCULOSKELETAL:  Left greater than right chronic lower extremity edema; No deformity  SKIN: Warm and dry NEUROLOGIC:  Alert and oriented x 3 PSYCHIATRIC:  Normal affect   ASSESSMENT:    1. Hypertensive heart disease with congestive heart failure, unspecified heart failure type ([place])   2. Chronic diastolic heart failure ([place])   3. Pulmonary hypertension ([place])   4. Morbid obesity ([place])    PLAN:    In order of problems listed above:  Chronic diastolic heart failure/hypertensive heart disease with heart failure  - As a result of morbid  obesity normal EF  -  she has gained approximate 20 pounds over the last 2 months. Some of this is from dietary indiscretion but some of it is likely fluid weight. I do understand that she had acute kidney injury with creatinine up to 10 in January 2018 in the setting of UTI as well. Her last creatinine was 1.3812/01/08. I will restart some low-dose Lasix 20 mg once a day to help her with her overall fluid management. Check a basic metabolic profile at return visit. She never did see nephrology.    morbid obesity  - Continue to encourage weight loss  Obesity hypoventilation syndrome  - Tracheostomy.  If she is concerned again about urine Hx infection, please see her primary physician.   Medication Adjustments/Labs and Tests Ordered: Current medicines  are reviewed at length with the patient today.  Concerns regarding medicines are outlined above. Labs and tests ordered and medication changes are outlined in the patient instructions below:  Patient Instructions  Medication Instructions:  Please start Furosemide 20 mg a day. Continue all other medications as listed.  Follow-Up: Follow up in 3 months with Richardson Dopp, PA.  If you need a refill on your cardiac medications before your next appointment, please call your pharmacy.  Thank you for choosing [facility]!!        Signed, Candee Furbish, MD  01/26/2017 11:33 AM    [place]

## 2017-01-27 ENCOUNTER — Ambulatory Visit (HOSPITAL_COMMUNITY): Payer: Self-pay

## 2017-02-01 NOTE — Progress Notes (Signed)
Reviewed & agree with plan  

## 2017-02-03 ENCOUNTER — Ambulatory Visit (HOSPITAL_COMMUNITY)
Admission: RE | Admit: 2017-02-03 | Discharge: 2017-02-03 | Disposition: A | Discharge status: Home / Self Care | Payer: Medicaid Other | Source: Ambulatory Visit | Attending: Acute Care | Admitting: Acute Care

## 2017-02-03 DIAGNOSIS — I5032 Chronic diastolic (congestive) heart failure: Secondary | ICD-10-CM | POA: Diagnosis not present

## 2017-02-03 DIAGNOSIS — G4733 Obstructive sleep apnea (adult) (pediatric): Secondary | ICD-10-CM | POA: Insufficient documentation

## 2017-02-03 DIAGNOSIS — I110 Hypertensive heart disease with heart failure: Secondary | ICD-10-CM | POA: Diagnosis not present

## 2017-02-03 DIAGNOSIS — Z930 Tracheostomy status: Secondary | ICD-10-CM | POA: Diagnosis not present

## 2017-02-03 DIAGNOSIS — Z4682 Encounter for fitting and adjustment of non-vascular catheter: Secondary | ICD-10-CM | POA: Diagnosis present

## 2017-02-03 NOTE — Progress Notes (Signed)
   Subjective:  ROV trach change   Patient ID: [name] [name], female    DOB: 04/27/1981, 36 y.o.   MRN: [id number]  HPI   [name] [name] returns today for routine trach change. No new issues or problems with trach. She is accompanied by her daughter. She is working on CPAP at home trying to get to a point to see if this is a reasonable option for her.    Review of Systems  Constitutional: Negative.   HENT: Negative.   Eyes: Negative.   Respiratory: Negative.   Cardiovascular: Negative.   Gastrointestinal: Negative.   Endocrine: Negative.   Allergic/Immunologic: Negative.   Neurological: Negative.   Psychiatric/Behavioral: Negative.    Vital signs:blood pressure 184/112, pulse 88, respirations 18 and pulse oximetry 93 %    Objective:   Physical Exam  Constitutional: She is oriented to person, place, and time. She appears well-developed and well-nourished. No distress.  HENT:  Head: Normocephalic and atraumatic.  Mouth/Throat: Oropharynx is clear and moist.  Neck: Normal range of motion. No JVD present. Carotid bruit is not present. No tracheal deviation and no edema present. No thyroid mass present.    Cardiovascular: Normal rate, regular rhythm, S1 normal, S2 normal and normal pulses.  Exam reveals no distant heart sounds.   Pulmonary/Chest: Effort normal and breath sounds normal. No accessory muscle usage or stridor. No respiratory distress. She has no decreased breath sounds.  Abdominal: Soft. Normal appearance.  Neurological: She is alert and oriented to person, place, and time. She is not disoriented.  Skin: Skin is warm, dry and intact. She is not diaphoretic. No cyanosis. Nails show no clubbing.       Assessment & Plan:   OSA  Trach dependence  Chronic Diastolic HF  HTN   Discussion  This is a 36 year old female w/ severe OSA, She has been trach dependent for some time. She underwent her sleep study back in April and now has her CPAP machine. The challenge has been her  ability to tolerate it. She tells me that the home care folks just recently made some adjustments but she is only able to tolerate the mask briefly because of the force of pressure it exerts on her. This has resulted in very limited use of Her CPAP machine (maybe 2 hours in last 2 weeks). I did confirm w/ her that she at least takes off the occlusive red trach cap at night so even though she's not using the CPAP her apnea is treated. I took several minutes council ing her on the patho-physiology of untreated sleep apnea but also re-assured her that as long as the trach is not capped at night then her sleep apnea is being treated even if she is off the CPAP. She tells me that she has another mask at home she is going to try. I have encouraged her to take this in small steps, but also to reach out to her DME provider to see if any adjustments can be made (specifically perhaps the CPAP rap can be changed so that she doesn't have an immediate rush of air).  Plan Cont routine trach care ROV 6 weeks for change Keep trach capped during day-time Capped during CPAP but open if not on CPAP.   I am not going to decannulate her unless she is 100% compliant w/ CPAP.  Erick Colace ACNP-BC [place] Pager # [phone] OR # [phone] if no answer

## 2017-02-03 NOTE — Progress Notes (Signed)
Tracheostomy Procedure Note  [name] [name] [id number] 04-11-1981  Pre Procedure Tracheostomy Information  Trach Brand: Bivona Size: 5.0 Style: Uncuffed Secured by: Velcro   Procedure: trach change    Post Procedure Tracheostomy Information  Trach Brand: Bivona Size: 5.0 Style: Uncuffed Secured by: Velcro   Post Procedure Evaluation:  ETCO2 positive color change from yellow to purple : Yes.   Vital signs:blood pressure 184/112, pulse 88, respirations 18 and pulse oximetry 93 % Patients current condition: stable Complications: No apparent complications Trach site exam: clean, dry, no drainage Wound care done: 4 x 4 gauze Patient did tolerate procedure well.   Education: none  Prescription needs: none    Additional needs: none

## 2017-02-09 ENCOUNTER — Ambulatory Visit: Payer: Self-pay | Admitting: Adult Health

## 2017-03-07 ENCOUNTER — Ambulatory Visit: Payer: Self-pay | Admitting: Acute Care

## 2017-03-17 ENCOUNTER — Encounter (HOSPITAL_COMMUNITY)
Admission: RE | Admit: 2017-03-17 | Discharge: 2017-03-17 | Disposition: A | Discharge status: Home / Self Care | Payer: Medicaid Other | Source: Ambulatory Visit | Attending: Dermatology | Admitting: Dermatology

## 2017-03-17 DIAGNOSIS — I5032 Chronic diastolic (congestive) heart failure: Secondary | ICD-10-CM | POA: Insufficient documentation

## 2017-03-17 DIAGNOSIS — Z930 Tracheostomy status: Secondary | ICD-10-CM | POA: Diagnosis not present

## 2017-03-17 DIAGNOSIS — G4733 Obstructive sleep apnea (adult) (pediatric): Secondary | ICD-10-CM | POA: Diagnosis not present

## 2017-03-17 DIAGNOSIS — I10 Essential (primary) hypertension: Secondary | ICD-10-CM | POA: Diagnosis not present

## 2017-03-17 DIAGNOSIS — I110 Hypertensive heart disease with heart failure: Secondary | ICD-10-CM | POA: Diagnosis not present

## 2017-03-17 DIAGNOSIS — R609 Edema, unspecified: Secondary | ICD-10-CM | POA: Insufficient documentation

## 2017-03-17 DIAGNOSIS — E1159 Type 2 diabetes mellitus with other circulatory complications: Secondary | ICD-10-CM | POA: Insufficient documentation

## 2017-03-17 DIAGNOSIS — Z4682 Encounter for fitting and adjustment of non-vascular catheter: Secondary | ICD-10-CM | POA: Insufficient documentation

## 2017-03-17 DIAGNOSIS — I152 Hypertension secondary to endocrine disorders: Secondary | ICD-10-CM | POA: Insufficient documentation

## 2017-03-17 NOTE — Progress Notes (Signed)
Tracheostomy Procedure Note  [name] [name] [id number] 21-Feb-1981  Pre Procedure Tracheostomy Information  Trach Brand: Portex  ( Bivona) Size: 5.0 Style: Uncuffed Secured by: Velcro  Vitals BP 217/117  RR 20  HR 105  02 saturation 100% on RA capped Procedure: Trach  Cleaning and trach change    Post Procedure Tracheostomy Information  Trach Brand: Portex  (Bivona) Size: 5.0 Style: Uncuffed Secured by: Velcro   Post Procedure Evaluation:  ETCO2 positive color change from yellow to purple : Yes.   Vital signs:blood pressure 197/120, pulse 108, respirations 22 and pulse oximetry 99 % on RA capped. Patients current condition: stable Complications: No apparent complications Trach site exam: clean Wound care done: dry Patient did tolerate procedure well.   Education: none  Prescription needs none    Additional needs: Red caps x3 to take home  Note:  Plan to try to change pt to Shiley uncuffed # 4 at next visit for pt comfort with different capping style.  Will need to given lidocaine neb tx and use lidocaine jelly for insertion per. Lucendia Herrlich request.

## 2017-03-17 NOTE — Progress Notes (Signed)
   Subjective:    Patient ID: [name] [name], female    DOB: 06-Jun-1981, 36 y.o.   MRN: [id number]  HPI  Raiya is well known to me. She has a significant h/o severe OSA, HFpEF & HTN. She has had a trach chronically for some time now and just recently gotten a CPAP machine after having PSG and titration study. She has had the CPAP machine since May this year. Unfortunately she has had a hard time using the CPAP in part d/t comfort of mask, also due to pressure required and also due to some anxiety related to having the trach removed. She has not really improved much as far as using the CPAP. Continues to use it only 2-3 hrs a night. Of note although she has been completely asymptomatic she was fairly hypertensive during this visit.   Review of Systems  Constitutional: Negative.   HENT: Negative.   Eyes: Negative.   Respiratory: Negative.   Cardiovascular: Negative.   Gastrointestinal: Negative.   Endocrine: Negative.   Genitourinary: Negative.   Musculoskeletal: Negative.   Allergic/Immunologic: Negative.   Neurological: Negative.   Hematological: Negative.   Psychiatric/Behavioral: Negative.    HR 105 bp 217/117 rr 20 sats 100% capped     Objective:   Physical Exam  Constitutional: She is oriented to person, place, and time. She appears well-developed and well-nourished. No distress.  HENT:  Head: Normocephalic.  # 5 portex cuffless trach changed I had attempted to change her to # 4 shiley so that we could use the red occlusive inner cannula but her stoma was too small to accept this w.out significant local anesthetic did have small/scant amount of bloody trach secretions   Eyes: Conjunctivae and EOM are normal. Right eye exhibits no discharge.  Cardiovascular: Normal rate and regular rhythm.   Pulmonary/Chest: Effort normal and breath sounds normal.  Abdominal: Soft. Bowel sounds are normal. She exhibits no distension.  Musculoskeletal: Normal range of motion. She exhibits no  edema.  Neurological: She is alert and oriented to person, place, and time.  Skin: Skin is warm and dry. She is not diaphoretic. No erythema.  Psychiatric: She has a normal mood and affect.   Trach change # 5 portex (bivona). Attempted to change to _0  cuffless shiley but aborted thsi d/t small trach stoma size and pt not feeling comfortable w trach change     Assessment & Plan:   Severe OSA HFpEF HTN (poorly controlled) Tracheostomy dependence  Discussion Ellyanna is doing well from a trach stand-point. The thing that is holding her back form decannulation is her ability to tolerate CPAP. Until we can figure out a night time regimen that includes her using the trach I think it needs to stay in.   Plan Cont routine trach care ROV 8 weeks Have advised her to call her PCP today. I think she hneeds to have her blood pressure addressed.   Erick Colace ACNP-BC [place] Pager # [phone] OR # [phone] if no answer

## 2017-03-23 ENCOUNTER — Telehealth: Payer: Self-pay | Admitting: Pharmacist

## 2017-03-23 ENCOUNTER — Telehealth: Payer: Self-pay | Admitting: Acute Care

## 2017-03-23 NOTE — Telephone Encounter (Signed)
Spoke to patient and she reports that she went to trach center today and pressure was elevated - she does not know numbers. She states that it is always elevated when she goes to appt in hospital, but fine otherwise. She would like to schedule appt to have checked in our office. She is scheduled for 03/31/17 for BP check. She states appreciation and understanding.

## 2017-03-23 NOTE — Telephone Encounter (Signed)
Patient calling, states that the hospital staff checked her BP and it was elevated. Patient also wanted to see if her medications could be managed to help BP.

## 2017-03-31 ENCOUNTER — Encounter: Payer: Self-pay | Admitting: Pharmacist

## 2017-03-31 ENCOUNTER — Ambulatory Visit (INDEPENDENT_AMBULATORY_CARE_PROVIDER_SITE_OTHER): Payer: Medicaid Other | Admitting: Pharmacist

## 2017-03-31 VITALS — BP 162/104 | HR 96

## 2017-03-31 DIAGNOSIS — I110 Hypertensive heart disease with heart failure: Secondary | ICD-10-CM | POA: Diagnosis not present

## 2017-03-31 NOTE — Patient Instructions (Addendum)
It was good seeing you today!  Your blood pressure is above your goal today. We will schedule lab work for after this visit.   Depending on the results, we will adjust your medications accordingly.   Once we get the lab work back, the clinic will contact you.

## 2017-03-31 NOTE — Progress Notes (Signed)
Patient ID: [name] [name]                 DOB: Mar 26, 1981                      MRN: [id number]     HPI: [name] [name] is a 36 y.o. female referred by Dr. Marlou Porch to HTN clinic. PMH is significant for HTN, OSA, CHF (EF 50-55%), CKD, obesity, gestational DM. At her most recent clinic visit on 4/25, BP was 178/82, and amlodipine 5 mg daily was added to her regimen of hydralazine 100 mg TID, isosorbide dinitrate 30 mg TID, and carvedilol 25 mg BID. At her visit with Dr. Marlou Porch on 6/6, pt was fluid overloaded and BP was 148/88. Furosemide 20 mg once daily was started. Pt noted that she may have white coat hypertension. She presents today for BP follow up.   Pt presents to clinic in good spirits. Pt states that in the trach clinic last week her SBP was around 190. She reports that she has been tolerating her hypertension medications well and has not noticed any abnormal side effects. No dizziness, lightheadedness, headaches, or falls. She has a BP cuff at home but has not checked her BP in a while. HR in clinic is 96. BP is 162/104, reports that she did not take her mid day doses of medications prior to visit (usually takes in early afternoon thus has not "missed" a dose).    Current HTN meds: amlodipine 5 mg daily, carvedilol 25 mg BID, furosemide 20 mg daily, hydralazine 100 mg TID, isosorbide dinitrate 30 mg TID  Previously tried: labetalol - BP not well controlled.   BP goal: <130/80 mmHg    Family History: The patient's family history includes Birth defects in her paternal grandfather; Diabetes in her other; Hypertension in her mother.  Social History: Denies tobacco products and alcohol  Diet: Eat most of her meals prepared from home. She limits salt. No coffee or sodas. Occasional tea intake not daily. Mostly drinks water or cranberry juice.   Exercise: Walks (generally 20-30 minutes) 2-3 times per week.   Home BP readings: Reports measurements similar to reading in office today  Wt  Readings from Last 3 Encounters:  01/26/17 (!) 365 lb (165.6 kg)  12/29/16 (!) 354 lb 12.8 oz (160.9 kg)  12/01/16 (!) 344 lb (156 kg)   BP Readings from Last 3 Encounters:  01/26/17 (!) 148/88  12/29/16 (!) 164/92  12/15/16 (!) 178/82   Pulse Readings from Last 3 Encounters:  01/26/17 92  12/29/16 95  12/15/16 97    Renal function: CrCl cannot be calculated (Patient's most recent lab result is older than the maximum 21 days allowed.).  Past Medical History:  Diagnosis Date  . Chronic diastolic heart failure ([place])    Echo 10/12: EF 50-55 // b. Echo 10/17: EF 50-55, mild BAE, PASP 42  . CKD (chronic kidney disease) stage 4, GFR 15-29 ml/min (HCC) 10/25/2016   admitted in 2/18 with SCr of 10 in the setting of dehydration from illness, etc >> improved to 5 at DC  . DM2 (diabetes mellitus, type 2) (HCC)    A1c 7.2 in 05/2016  . Gestational diabetes mellitus in pregnancy 05/24/2013  . HTN in pregnancy, chronic 05/24/2013  . Hypertensive heart disease with CHF (congestive heart failure) ([place]) 05/25/2016  . Morbid obesity ([place])   . OSA (obstructive sleep apnea) 07/09/2011   s/p Trach 2/2 prolonged respiratory failure during admx for CHF  in 05/2016  . Pulmonary hypertension ([place])    secondary from CHF/OSA  . Sinus tachycardia     Current Outpatient Prescriptions on File Prior to Visit  Medication Sig Dispense Refill  . amLODipine (NORVASC) 5 MG tablet Take 1 tablet (5 mg total) by mouth daily. 30 tablet 3  . amoxicillin-clavulanate (AUGMENTIN) 875-125 MG tablet Take 1 tablet by mouth 2 (two) times daily. 16 tablet 0  . carvedilol (COREG) 25 MG tablet Take 1 tablet (25 mg total) by mouth 2 (two) times daily. 180 tablet 3  . fluticasone (FLONASE) 50 MCG/ACT nasal spray Place 2 sprays into both nostrils 2 (two) times daily. 16 g 2  . furosemide (LASIX) 20 MG tablet Take 1 tablet (20 mg total) by mouth daily. 30 tablet 6  . hydrALAZINE (APRESOLINE) 100 MG tablet Take 1 tablet (100 mg  total) by mouth 3 (three) times daily. 90 tablet 3  . isosorbide dinitrate (ISORDIL) 30 MG tablet Take 1 tablet (30 mg total) by mouth 3 (three) times daily. 90 tablet 4   Current Facility-Administered Medications on File Prior to Visit  Medication Dose Route Frequency Provider Last Rate Last Dose  . cetirizine (ZYRTEC) tablet 10 mg  10 mg Oral Daily Erick Colace, NP        Allergies  Allergen Reactions  . No Known Allergies      Assessment/Plan:  1. Hypertension - Pt's BP today was above her goal of <130/80 mmHg. Pt's most recent BMET was in 10/2016. Ordered BMET to be completed today given recent start of furosemide. If pt's renal fxn and electrolytes are stable, will likely start lisinopril. Encouraged pt to start checking her BP at home. F/u with pt regarding lab results when they become available. F/u in hypertension clinic as needed.   03/31/17 BMET shows pt's electrolytes are normal and Scr is 1.01. Renal fxn is stable. Will start lisinopril 5 mg once daily and recheck BMET in 2 weeks. F/u with pt in clinic in 4 weeks.   This patient was seen with Barkley Boards, [facility] PharmD Student [phone].   Thank you, Lelan Pons. [place], [place]  [address], [place], [place] [address]  Phone: [phone]; Fax: [phone] 03/31/2017 10:30 PM

## 2017-04-01 LAB — BASIC METABOLIC PANEL
BUN/Creatinine Ratio: 20 (ref 9–23)
BUN: 20 mg/dL (ref 6–20)
CO2: 29 mmol/L (ref 20–29)
Calcium: 9.5 mg/dL (ref 8.7–10.2)
Chloride: 99 mmol/L (ref 96–106)
Creatinine, Ser: 1.01 mg/dL — ABNORMAL HIGH (ref 0.57–1.00)
GFR calc Af Amer: 82 mL/min/{1.73_m2} (ref >59)
GFR calc non Af Amer: 71 mL/min/{1.73_m2} (ref >59)
Glucose: 92 mg/dL (ref 65–99)
Potassium: 4.6 mmol/L (ref 3.5–5.2)
Sodium: 141 mmol/L (ref 134–144)

## 2017-04-01 MED ORDER — LISINOPRIL 5 MG PO TABS: 5.0000 mg | ORAL_TABLET | Freq: Every day | ORAL | 1 refills | Status: DC

## 2017-04-14 ENCOUNTER — Other Ambulatory Visit: Payer: Self-pay | Admitting: Cardiology

## 2017-04-15 ENCOUNTER — Other Ambulatory Visit: Payer: Self-pay

## 2017-04-20 ENCOUNTER — Other Ambulatory Visit: Payer: Medicaid Other | Admitting: *Deleted

## 2017-04-20 ENCOUNTER — Ambulatory Visit (HOSPITAL_COMMUNITY)
Admission: RE | Admit: 2017-04-20 | Discharge: 2017-04-20 | Disposition: A | Discharge status: Home / Self Care | Payer: Medicaid Other | Source: Ambulatory Visit | Attending: Acute Care | Admitting: Acute Care

## 2017-04-20 DIAGNOSIS — Z430 Encounter for attention to tracheostomy: Secondary | ICD-10-CM | POA: Insufficient documentation

## 2017-04-20 DIAGNOSIS — I10 Essential (primary) hypertension: Secondary | ICD-10-CM | POA: Insufficient documentation

## 2017-04-20 DIAGNOSIS — Z930 Tracheostomy status: Secondary | ICD-10-CM | POA: Diagnosis not present

## 2017-04-20 DIAGNOSIS — I110 Hypertensive heart disease with heart failure: Secondary | ICD-10-CM

## 2017-04-20 DIAGNOSIS — I5030 Unspecified diastolic (congestive) heart failure: Secondary | ICD-10-CM | POA: Insufficient documentation

## 2017-04-20 DIAGNOSIS — G4733 Obstructive sleep apnea (adult) (pediatric): Secondary | ICD-10-CM | POA: Insufficient documentation

## 2017-04-20 LAB — BASIC METABOLIC PANEL
BUN/Creatinine Ratio: 20 (ref 9–23)
BUN: 22 mg/dL — ABNORMAL HIGH (ref 6–20)
CO2: 26 mmol/L (ref 20–29)
Calcium: 9.4 mg/dL (ref 8.7–10.2)
Chloride: 96 mmol/L (ref 96–106)
Creatinine, Ser: 1.11 mg/dL — ABNORMAL HIGH (ref 0.57–1.00)
GFR calc Af Amer: 73 mL/min/{1.73_m2} (ref >59)
GFR calc non Af Amer: 64 mL/min/{1.73_m2} (ref >59)
Glucose: 95 mg/dL (ref 65–99)
Potassium: 4.9 mmol/L (ref 3.5–5.2)
Sodium: 140 mmol/L (ref 134–144)

## 2017-04-20 NOTE — Progress Notes (Signed)
Tracheostomy Procedure Note  [name] [name] [id number] 03/26/1981  Pre Procedure Tracheostomy Information  Trach Brand: Bivona Size: 5.0 Style: Uncuffed Secured by: Velcro   Procedure: trach change    Post Procedure Tracheostomy Information  Trach Brand: Bivona Size: 5.0 Style: Uncuffed Secured by: Velcro   Post Procedure Evaluation:  ETCO2 positive color change from yellow to purple : Yes.   Vital signs:blood pressure 197/112, pulse 90, respirations 93 and pulse oximetry 15 % Patients current condition: stable Complications: No apparent complications Trach site exam: clean, dry Wound care done: 4 x 4 gauze Patient did tolerate procedure well.   Education: none  Prescription needs: nonoe    Additional needs: 4 week follow up

## 2017-04-20 NOTE — Progress Notes (Signed)
   Subjective:  ROV   Patient ID: [name] [name], female    DOB: Oct 21, 1980, 36 y.o.   MRN: [id number]  HPI [name] [name] returns today for routine trach change. She has been doing well since last change. During that time we did attempt to place regular 4 shiley so that she could have smaller amount of trach protruding from her neck but the external diameter of that trach would not allow advancement so we kept the size 5 Bivona. She had a little residual discomfort after that but it resolved. She does report today that she is having her typical discomfort that she experiences when it's about time for trach change which she describes as almost stabbing intermittent discomfort.    Review of Systems  Constitutional: Negative for activity change, appetite change, fatigue and fever.  HENT: Negative.   Eyes: Negative.   Respiratory: Negative.   Cardiovascular: Negative.   Gastrointestinal: Negative.   Endocrine: Negative.   Genitourinary: Negative.   Musculoskeletal: Negative.   Allergic/Immunologic: Negative.   Neurological: Negative.   Hematological: Negative.    197/112, pulse 90, respirations 93 and pulse oximetry 100%    Objective:   Physical Exam  Constitutional: She is oriented to person, place, and time. She appears well-developed and well-nourished. She is active and cooperative.  Non-toxic appearance. She does not have a sickly appearance. She does not appear ill.  HENT:  Head: Normocephalic.  [name] site is unremarkable. Has # 5 bivona cuffless trach. Excellent phonation   Cardiovascular: Normal rate, regular rhythm and normal pulses.   Pulmonary/Chest: Breath sounds normal. No accessory muscle usage. No respiratory distress.  Abdominal: Soft. Normal appearance and bowel sounds are normal.  Neurological: She is oriented to person, place, and time. She has normal strength. No cranial nerve deficit. GCS eye subscore is 4. GCS verbal subscore is 5. GCS motor subscore is 6.  Skin: Skin  is warm and dry.  Psychiatric: She has a normal mood and affect. Her speech is normal and behavior is normal. She is not agitated.   Trach BrandTheophilus Bones Size: 5.0 Style: Uncuffed Secured by: Velcro     Assessment & Plan:   Tracheostomy dependence  Severe OSA HFpEF Severe HTN (very labile)  Discussion  Doing well. Not a candidate to decannulate unless she can do well w/ CPAP.  Of note she is hypertensive again. She is asymptomatic w/ this and tells me she still needs to take her afternoon meds. She also has noticed that usually when she is not in the hospital her BP is better.   Plan Cont routine trach care ROV 4 weeks from here on out.  She will follow w/ her PCP re: her BP  Erick Colace ACNP-BC [place] Pager # [phone] OR # [phone] if no answer

## 2017-04-29 ENCOUNTER — Encounter: Payer: Self-pay | Admitting: Nurse Practitioner

## 2017-04-29 ENCOUNTER — Ambulatory Visit: Payer: Self-pay

## 2017-05-04 ENCOUNTER — Other Ambulatory Visit: Payer: Self-pay

## 2017-05-04 ENCOUNTER — Ambulatory Visit: Payer: Self-pay | Admitting: Physician Assistant

## 2017-05-04 DIAGNOSIS — I10 Essential (primary) hypertension: Secondary | ICD-10-CM

## 2017-05-04 MED ORDER — HYDRALAZINE HCL 100 MG PO TABS: 100.0000 mg | ORAL_TABLET | Freq: Three times a day (TID) | ORAL | 3 refills | Status: DC

## 2017-05-05 ENCOUNTER — Telehealth: Payer: Self-pay | Admitting: Adult Health

## 2017-05-05 ENCOUNTER — Encounter: Payer: Self-pay | Admitting: Nurse Practitioner

## 2017-05-05 NOTE — Telephone Encounter (Signed)
lmtcb X1 for [place] at [facility].

## 2017-05-07 NOTE — Progress Notes (Deleted)
Patient ID: [name] [name]                 DOB: [birth year]                      MRN: [id number]     HPI: [name] [name] is a 36 y.o. female referred by Dr. Marlou Porch to HTN clinic. PMH is significant for HTN, OSA, CHF (EF 50-55%), CKD, obesity, and gestational DM. Pt previously noted that she might have white coat hypertension. During her initial HTN clinic visit 4/25, amlodipine was added. At her most recent clinic visit on 8/9, her BP was elevated and lisinopril was started. Baseline BMET was wnl and a repeat three weeks later was stable.  Current HTN meds: amlodipine 16m daily, carvedilol 269mBID, furosemide 2081maily, hydralazine 100m77mD, isosorbide dinitrate 30mg46m, lisinopril 5mg d43my  Previously tried: labetalol - BP not well controlled  BP goal: <130/80 mmHg  Family History: The patient's family history includes Birth defects in her paternal grandfather; Diabetes in her other; Hypertension in her mother.  Social History: Denies tobacco products and alcohol  Diet: Eat most of her meals prepared from home. She limits salt. No coffee or sodas. Occasional tea intake not daily. Mostly drinks water or cranberry juice.   Exercise: Walks (generally 20-30 minutes) 2-3 times per week.   Home BP readings:   Wt Readings from Last 3 Encounters:  01/26/17 (!) 365 lb (165.6 kg)  12/29/16 (!) 354 lb 12.8 oz (160.9 kg)  12/01/16 (!) 344 lb (156 kg)   BP Readings from Last 3 Encounters:  03/31/17 (!) 162/104  01/26/17 (!) 148/88  12/29/16 (!) 164/92   Pulse Readings from Last 3 Encounters:  03/31/17 96  01/26/17 92  12/29/16 95    Renal function: CrCl cannot be calculated (Unknown ideal weight.).  Past Medical History:  Diagnosis Date  . Chronic diastolic heart failure (HCC)  [place] 10/12: EF 50-55 // b. Echo 10/17: EF 50-55, mild BAE, PASP 42  . CKD (chronic kidney disease) stage 4, GFR 15-29 ml/min (HCC) 10/25/2016   admitted in 2/18 with SCr of 10 in the setting of  dehydration from illness, etc >> improved to 5 at DC  . DM2 (diabetes mellitus, type 2) (HCC)    A1c 7.2 in 05/2016  . Gestational diabetes mellitus in pregnancy 05/24/2013  . HTN in pregnancy, chronic 05/24/2013  . Hypertensive heart disease with CHF (congestive heart failure) (HCC) 1[place]/2017  . Morbid obesity (HCC)  [place] (obstructive [place] 07/09/2011   s/p Trach 2/2 prolonged respiratory failure during admx for CHF in 05/2016  . Pulmonary hypertension (HCC)  [place]/OSA  . Sinus tachycardia     Current Outpatient Prescriptions on File Prior to Visit  Medication Sig Dispense Refill  . amLODipine (NORVASC) 5 MG tablet Take 1 tablet (5 mg total) by mouth daily. 30 tablet 3  . amLODipine (NORVASC) 5 MG tablet TAKE 1 TABLET BY MOUTH EVERY DAY 30 tablet 9  . amoxicillin-clavulanate (AUGMENTIN) 875-125 MG tablet Take 1 tablet by mouth 2 (two) times daily. (Patient not taking: Reported on 03/31/2017) 16 tablet 0  . carvedilol (COREG) 25 MG tablet Take 1 tablet (25 mg total) by mouth 2 (two) times daily. 180 tablet 3  . fluticasone (FLONASE) 50 MCG/ACT nasal spray Place 2 sprays into both nostrils 2 (two) times daily. (Patient taking differently: Place 2 sprays into both nostrils daily as  needed. ) 16 g 2  . furosemide (LASIX) 20 MG tablet Take 1 tablet (20 mg total) by mouth daily. 30 tablet 6  . hydrALAZINE (APRESOLINE) 100 MG tablet Take 1 tablet (100 mg total) by mouth 3 (three) times daily. 90 tablet 3  . isosorbide dinitrate (ISORDIL) 30 MG tablet Take 1 tablet (30 mg total) by mouth 3 (three) times daily. 90 tablet 4  . lisinopril (PRINIVIL,ZESTRIL) 5 MG tablet Take 1 tablet (5 mg total) by mouth daily. 30 tablet 1   Current Facility-Administered Medications on File Prior to Visit  Medication Dose Route Frequency Provider Last Rate Last Dose  . cetirizine (ZYRTEC) tablet 10 mg  10 mg Oral Daily Erick Colace, NP        Allergies  Allergen Reactions  . No Known  Allergies      Assessment/Plan:  Hypertension -

## 2017-05-09 ENCOUNTER — Ambulatory Visit: Payer: Self-pay

## 2017-05-09 NOTE — Telephone Encounter (Signed)
Called and spoke with [name] [name] at [facility] and she stated that the pt in need of appt for oxygen recertification.  Pt was last seen 12/2016 with TP---  Called and scheduled appt with TP and o2 qualifying.  Nothing further is needed.

## 2017-05-11 ENCOUNTER — Encounter: Payer: Self-pay | Admitting: Nurse Practitioner

## 2017-05-11 ENCOUNTER — Ambulatory Visit (INDEPENDENT_AMBULATORY_CARE_PROVIDER_SITE_OTHER): Payer: Medicaid Other | Admitting: Adult Health

## 2017-05-11 ENCOUNTER — Encounter: Payer: Self-pay | Admitting: Adult Health

## 2017-05-11 DIAGNOSIS — G4733 Obstructive sleep apnea (adult) (pediatric): Secondary | ICD-10-CM

## 2017-05-11 DIAGNOSIS — J9612 Chronic respiratory failure with hypercapnia: Secondary | ICD-10-CM | POA: Diagnosis not present

## 2017-05-11 NOTE — Progress Notes (Signed)
_0  ID: [name] [name], female    DOB: 1980-12-24, 36 y.o.   MRN: [id number]  Chief Complaint  Patient presents with  . Follow-up    OSA /O2 RF     Referring provider: Flossie Buffy, NP  HPI: 36 year old female followed for obstructive sleep apnea and chronic  She was diagnosed with sleep apnea October 2012 with an AHI at 75. Hospital admission October 2017 with critical illness requiring tracheostomy.  11/16/2016 sleep study showed severe sleep apnea, AHI 59, optimal C Pap pressure was 12 cm of H2O (done with capped trach ) .   05/11/2017 Follow up : OSA /O2 RF  Patient returns for a four-month follow-up. Patient has known underlying severe sleep apnea. She had a tracheostomy after critical illness in October 2017. Patient's sleep study on a capped trach 11/16/2016 showed severe sleep apnea with AHI of 59. Recommendations were to wear C Pap at bedtime with Trach. However, patient has been intolerant to this and recommendations were to uncap trach at bedtime while sleeping with trach collar. She says she has still be unable to wear CPAP . We discussed new mask options .  She feels rested. We discussed wt loss.        Allergies  Allergen Reactions  . No Known Allergies     Immunization History  Administered Date(s) Administered  . Influenza,inj,Quad PF,6+ Mos 06/30/2016    Past Medical History:  Diagnosis Date  . Chronic diastolic heart failure ([place])    Echo 10/12: EF 50-55 // b. Echo 10/17: EF 50-55, mild BAE, PASP 42  . CKD (chronic kidney disease) stage 4, GFR 15-29 ml/min (HCC) 10/25/2016   admitted in 2/18 with SCr of 10 in the setting of dehydration from illness, etc >> improved to 5 at DC  . DM2 (diabetes mellitus, type 2) (HCC)    A1c 7.2 in 05/2016  . Gestational diabetes mellitus in pregnancy 05/24/2013  . HTN in pregnancy, chronic 05/24/2013  . Hypertensive heart disease with CHF (congestive heart failure) ([place]) 05/25/2016  . Morbid obesity ([place])    . OSA (obstructive sleep apnea) 07/09/2011   s/p Trach 2/2 prolonged respiratory failure during admx for CHF in 05/2016  . Pulmonary hypertension ([place])    secondary from CHF/OSA  . Sinus tachycardia     Tobacco History: History  Smoking Status  . Never Smoker  Smokeless Tobacco  . Never Used   Counseling given: Not Answered   Outpatient Encounter Prescriptions as of 05/11/2017  Medication Sig  . amLODipine (NORVASC) 5 MG tablet TAKE 1 TABLET BY MOUTH EVERY DAY  . carvedilol (COREG) 25 MG tablet Take 1 tablet (25 mg total) by mouth 2 (two) times daily.  . fluticasone (FLONASE) 50 MCG/ACT nasal spray Place 2 sprays into both nostrils 2 (two) times daily. (Patient taking differently: Place 2 sprays into both nostrils daily as needed. )  . furosemide (LASIX) 20 MG tablet Take 1 tablet (20 mg total) by mouth daily.  . hydrALAZINE (APRESOLINE) 100 MG tablet Take 1 tablet (100 mg total) by mouth 3 (three) times daily.  . isosorbide dinitrate (ISORDIL) 30 MG tablet Take 1 tablet (30 mg total) by mouth 3 (three) times daily.  Marland Kitchen lisinopril (PRINIVIL,ZESTRIL) 5 MG tablet Take 1 tablet (5 mg total) by mouth daily.  . [DISCONTINUED] amLODipine (NORVASC) 5 MG tablet Take 1 tablet (5 mg total) by mouth daily.  . [DISCONTINUED] amoxicillin-clavulanate (AUGMENTIN) 875-125 MG tablet Take 1 tablet by mouth 2 (two) times daily. (  Patient not taking: Reported on 05/11/2017)   Facility-Administered Encounter Medications as of 05/11/2017  Medication  . cetirizine (ZYRTEC) tablet 10 mg     Review of Systems  Constitutional:   No  weight loss, night sweats,  Fevers, chills,  +fatigue, or  lassitude.  HEENT:   No headaches,  Difficulty swallowing,  Tooth/dental problems, or  Sore throat,                No sneezing, itching, ear ache, nasal congestion, post nasal drip,   CV:  No chest pain,  Orthopnea, PND, swelling in lower extremities, anasarca, dizziness, palpitations, syncope.   GI  No  heartburn, indigestion, abdominal pain, nausea, vomiting, diarrhea, change in bowel habits, loss of appetite, bloody stools.   Resp: No shortness of breath with exertion or at rest.  No excess mucus, no productive cough,  No non-productive cough,  No coughing up of blood.  No change in color of mucus.  No wheezing.  No chest wall deformity  Skin: no rash or lesions.  GU: no dysuria, change in color of urine, no urgency or frequency.  No flank pain, no hematuria   MS:  No joint pain or swelling.  No decreased range of motion.  No back pain.    Physical Exam  BP (!) 162/86 (BP Location: Left Arm, Cuff Size: Large)   Pulse 90   Ht 5' 6" (1.676 m)   Wt (!) 382 lb 11.2 oz (173.6 kg)   SpO2 94%   BMI 61.77 kg/m   GEN: A/Ox3; pleasant , NAD, obese    HEENT:  [place]/AT,  EACs-clear, TMs-wnl, NOSE-clear, THROAT-clear, no lesions, no postnasal drip or exudate noted. Class 2-3 MP airway   NECK:  Supple w/ fair ROM; no JVD; normal carotid impulses w/o bruits; no thyromegaly or nodules palpated; no lymphadenopathy.  Midline trach with clean dressing   RESP  Clear  P & A; w/o, wheezes/ rales/ or rhonchi. no accessory muscle use, no dullness to percussion  CARD:  RRR, no m/r/g, no peripheral edema, pulses intact, no cyanosis or clubbing.  GI:   Soft & nt; nml bowel sounds; no organomegaly or masses detected.   Musco: Warm bil, no deformities or joint swelling noted.   Neuro: alert, no focal deficits noted.    Skin: Warm, no lesions or rashes    Lab Results:  CBC    BNP  ProBNP No results found for: PROBNP  Imaging: No results found.   Assessment & Plan:   No problem-specific Assessment & Plan notes found for this encounter.     [name], NP 05/11/2017

## 2017-05-11 NOTE — Assessment & Plan Note (Signed)
Cont on trach collar At bedtime  With uncapped trach.

## 2017-05-11 NOTE — Patient Instructions (Addendum)
Your blood pressure is elevated, will need follow-up to primary care provider for this Retry CPAP At bedtime w/ trach capped .  Try gel face mask.   If not wearing CPAP , will need to un-cap Trach and use Trach collar At bedtime  .  Follow up in 3-4 months and As needed  Dr. Halford Chessman  Or Keron Koffman NP  ONO on uncapped trach At bedtime  .  Please contact office for sooner follow up if symptoms do not improve or worsen or seek emergency care

## 2017-05-11 NOTE — Addendum Note (Signed)
Addended by: Parke Poisson E on: 05/11/2017 12:56 PM   Modules accepted: Orders

## 2017-05-11 NOTE — Progress Notes (Signed)
I have reviewed and agree with assessment/plan.  Chesley Mires, MD [facility] 05/11/2017, 12:08 PM Pager:  [phone]

## 2017-05-11 NOTE — Assessment & Plan Note (Signed)
Continue on uncapped trach at bedtime with trach collar.  Retry CPAP w/ capped trach on auto set  New mask orderd -gel dreamwear full face.   Plan  Patient Instructions  Your blood pressure is elevated, will need follow-up to primary care provider for this Retry CPAP At bedtime w/ trach capped .  Try gel face mask.   If not wearing CPAP , will need to un-cap Trach and use Trach collar At bedtime  .  Follow up in 3-4 months and As needed  Dr. Halford Chessman  Or Terrace Chiem NP  ONO on uncapped trach At bedtime  .  Please contact office for sooner follow up if symptoms do not improve or worsen or seek emergency care

## 2017-05-11 NOTE — Assessment & Plan Note (Signed)
Wt loss  

## 2017-05-13 ENCOUNTER — Encounter: Payer: Self-pay | Admitting: Adult Health

## 2017-05-18 ENCOUNTER — Ambulatory Visit (HOSPITAL_COMMUNITY): Payer: Self-pay

## 2017-05-18 ENCOUNTER — Other Ambulatory Visit: Payer: Self-pay | Admitting: Cardiology

## 2017-05-24 ENCOUNTER — Ambulatory Visit (INDEPENDENT_AMBULATORY_CARE_PROVIDER_SITE_OTHER): Payer: Medicaid Other | Admitting: Physician Assistant

## 2017-05-24 ENCOUNTER — Encounter: Payer: Self-pay | Admitting: Physician Assistant

## 2017-05-24 ENCOUNTER — Ambulatory Visit (HOSPITAL_COMMUNITY): Payer: Self-pay

## 2017-05-24 ENCOUNTER — Ambulatory Visit (HOSPITAL_COMMUNITY)
Admission: RE | Admit: 2017-05-24 | Discharge: 2017-05-24 | Disposition: A | Discharge status: Home / Self Care | Payer: Medicaid Other | Source: Ambulatory Visit | Attending: Acute Care | Admitting: Acute Care

## 2017-05-24 VITALS — BP 160/82 | HR 89 | Ht 66.0 in | Wt 382.4 lb

## 2017-05-24 DIAGNOSIS — I5032 Chronic diastolic (congestive) heart failure: Secondary | ICD-10-CM | POA: Diagnosis not present

## 2017-05-24 DIAGNOSIS — R9431 Abnormal electrocardiogram [ECG] [EKG]: Secondary | ICD-10-CM

## 2017-05-24 DIAGNOSIS — Z430 Encounter for attention to tracheostomy: Secondary | ICD-10-CM | POA: Insufficient documentation

## 2017-05-24 DIAGNOSIS — I272 Pulmonary hypertension, unspecified: Secondary | ICD-10-CM | POA: Diagnosis not present

## 2017-05-24 DIAGNOSIS — I110 Hypertensive heart disease with heart failure: Secondary | ICD-10-CM

## 2017-05-24 MED ORDER — FUROSEMIDE 40 MG PO TABS: 40.0000 mg | ORAL_TABLET | Freq: Every day | ORAL | 3 refills | Status: DC

## 2017-05-24 MED ORDER — NYSTATIN 100000 UNIT/GM EX OINT: TOPICAL_OINTMENT | Freq: Two times a day (BID) | CUTANEOUS | 0 refills | Status: DC

## 2017-05-24 MED ORDER — NYSTATIN 100000 UNIT/GM EX OINT: TOPICAL_OINTMENT | Freq: Two times a day (BID) | CUTANEOUS | Status: DC

## 2017-05-24 NOTE — Patient Instructions (Signed)
Medication Instructions:  Your physician has recommended you make the following change in your medication:  1. INCREASE Lasic to 40 mg daily  -- If you need a refill on your cardiac medications before your next appointment, please call your pharmacy. --  Labwork: Your physician recommends that you return for lab work in: one week for BMET  Testing/Procedures: Your physician has requested that you have an echocardiogram. Echocardiography is a painless test that uses sound waves to create images of your heart. It provides your doctor with information about the size and shape of your heart and how well your heart's chambers and valves are working. This procedure takes approximately one hour. There are no restrictions for this procedure.  Follow-Up: Your physician recommends that you schedule a follow-up appointment in: 1 month with Richardson Dopp, PA.  Thank you for choosing [facility]!!    Any Other Special Instructions Will Be Listed Below (If Applicable).

## 2017-05-24 NOTE — Progress Notes (Signed)
Cardiology Office Note:    Date:  05/24/2017   ID:  [name] [name], DOB [birth year], MRN [id number]  PCP:  Flossie Buffy, NP  Cardiologist:  Dr. Candee Furbish  Electrophysiologist:  n/a Pulmonologist: Dr. [name] Nephrologist: Dr. Lorrene Reid   Referring MD: Lorayne Marek [name] Brooke, NP   Chief Complaint  Patient presents with  . Follow-up    CHF, HTN    History of Present Illness:    [name] [name] is a 36 y.o. female with a hx of HTN, OSA, obesity, gestational diabetes.  She was admitted in 10/17 respiratory failure in the setting of HTN emergency with pulmonary edema and OHS/OSA requiring intubation.  She ultimately underwent Tracheostomy due to prolonged respiratory failure.  She did have prolonged sinus pauses during coughing and straining against the ventilator.  This was reviewed with EP and she was not felt to need a PPM as her pauses were likely related to underlying OSA/OHS. Echocardiogram did demonstrate pulmonary hypertension with PASP 42 and normal ejection fraction.  She was admitted in 1/18 with acute renal failure in the setting of dehydration. She has been followed in our hypertension clinic to help with control of blood pressure. Last seen by Dr. Marlou Porch 6/18. She was started back on low-dose Lasix for fluid management.  [name] returns for follow up.  She is here with her daughter.  She has not noted any significant worsening shortness of breath.  She has not had chest pain.  She has not had paroxysmal nocturnal dyspnea, syncope.  She has noted lower extremity edema that seems stable.    Prior CV studies:   The following studies were reviewed today:  Echo 05/26/16 EF 50-55, mild BAE, PASP 42  Renal artery duplex 10/12  Normal renal arteries bilaterally  Echo 06/17/11:  mild LVH, EF 50-55%, mild LAE.  Past Medical History:  Diagnosis Date  . Chronic diastolic heart failure ([place])    Echo 10/12: EF 50-55 // b. Echo 10/17: EF 50-55, mild BAE, PASP 42  . CKD  (chronic kidney disease) stage 4, GFR 15-29 ml/min (HCC) 10/25/2016   admitted in 2/18 with SCr of 10 in the setting of dehydration from illness, etc >> improved to 5 at DC  . DM2 (diabetes mellitus, type 2) (HCC)    A1c 7.2 in 05/2016  . Gestational diabetes mellitus in pregnancy 05/24/2013  . HTN in pregnancy, chronic 05/24/2013  . Hypertensive heart disease with CHF (congestive heart failure) ([place]) 05/25/2016  . Morbid obesity ([place])   . OSA (obstructive sleep apnea) 07/09/2011   s/p Trach 2/2 prolonged respiratory failure during admx for CHF in 05/2016  . Pulmonary hypertension ([place])    secondary from CHF/OSA  . Sinus tachycardia     Past Surgical History:  Procedure Laterality Date  . TRACHEOSTOMY TUBE PLACEMENT N/A 06/11/2016   Procedure: TRACHEOSTOMY;  Surgeon: Melida Quitter, MD;  Location: [place];  Service: ENT;  Laterality: N/A;    Current Medications: Current Meds  Medication Sig  . amLODipine (NORVASC) 5 MG tablet TAKE 1 TABLET BY MOUTH EVERY DAY  . carvedilol (COREG) 25 MG tablet Take 1 tablet (25 mg total) by mouth 2 (two) times daily.  . fluticasone (FLONASE) 50 MCG/ACT nasal spray Place 2 sprays into both nostrils 2 (two) times daily.  . hydrALAZINE (APRESOLINE) 100 MG tablet Take 1 tablet (100 mg total) by mouth 3 (three) times daily.  . isosorbide dinitrate (ISORDIL) 30 MG tablet Take 1 tablet (30 mg total) by  mouth 3 (three) times daily.  Marland Kitchen lisinopril (PRINIVIL,ZESTRIL) 5 MG tablet TAKE 1 TABLET BY MOUTH EVERY DAY  . nystatin ointment (MYCOSTATIN) Apply topically 2 (two) times daily.  . [DISCONTINUED] furosemide (LASIX) 20 MG tablet Take 1 tablet (20 mg total) by mouth daily.   Current Facility-Administered Medications for the 05/24/17 encounter (Office Visit) with Richardson Dopp T, PA-C  Medication  . cetirizine (ZYRTEC) tablet 10 mg     Allergies:   No known allergies   Social History   Social History  . Marital status: Single    Spouse name: N/A  . Number of  children: N/A  . Years of education: N/A   Occupational History  . 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 Smoking status: Never Smoker  . Smokeless tobacco: Never Used  . Alcohol use No  . Drug use: No  . Sexual activity: Not Currently    Birth control/ protection: IUD   Other Topics Concern  . None   Social History Narrative   LIVES ALONE   WORKS AT [employer]   DENIES ANY TOBACCO OR ETOH USE           Family Hx: The patient's family history includes Birth defects in her paternal grandfather; Diabetes in her other; Hypertension in her mother.  ROS:   Please see the history of present illness.    ROS All other systems reviewed and are negative.   EKGs/Labs/Other Test Reviewed:    EKG:  EKG is  ordered today.  The ekg ordered today demonstrates NSR, HR 89, LAD, IVCD, QTc 498 ms  Recent Labs: 09/15/2016: B Natriuretic Peptide 175.1 09/16/2016: ALT 16 09/19/2016: Magnesium 2.1 09/20/2016: Hemoglobin 9.1; Platelets 469 04/20/2017: BUN 22; Creatinine, Ser 1.11; Potassium 4.9; Sodium 140   Recent Lipid Panel Lab Results  Component Value Date/Time   CHOL 176 09/15/2016 08:55 AM   TRIG 250.0 (H) 09/15/2016 08:55 AM   HDL 10.60 (L) 09/15/2016 08:55 AM   CHOLHDL 17 09/15/2016 08:55 AM   LDLCALC 92 05/26/2016 04:35 AM   LDLDIRECT 99.0 09/15/2016 08:55 AM    Physical Exam:    VS:  BP (!) 160/82   Pulse 89   Ht _0  (1.676 m)   Wt (!) 382 lb 6.4 oz (173.5 kg)   SpO2 97%   BMI 61.72 kg/m     Wt Readings from Last 3 Encounters:  05/24/17 (!) 382 lb 6.4 oz (173.5 kg)  05/11/17 (!) 382 lb 11.2 oz (173.6 kg)  01/26/17 (!) 365 lb (165.6 kg)     Physical Exam  Constitutional: She is oriented to person, place, and time. She appears well-developed and well-nourished. No distress.  HENT:  Head: Normocephalic and atraumatic.  Eyes: No scleral icterus.  Neck: JVD: I cannot appreciate JVD.  Trach collar in place    Cardiovascular: Normal rate and regular rhythm.   No murmur heard. Pulmonary/Chest: She has no rales.  Abdominal: Soft.  Musculoskeletal: She exhibits edema (1+ bilateral LE edema).  Neurological: She is alert and oriented to person, place, and time.  Skin: Skin is warm and dry.  Psychiatric: She has a normal mood and affect.    ASSESSMENT:    1. Hypertensive heart disease with chronic diastolic congestive heart failure ([place])   2. Chronic diastolic heart failure ([place])   3. Morbid obesity ([place])   4. Pulmonary hypertension ([place])   5. Abnormal EKG    PLAN:    In order  of problems listed above:  1. Hypertensive heart disease with chronic diastolic congestive heart failure (HCC) BP remains uncontrolled.  She has not had her mid day medications yet.  Her weight continues to increase.  Most of this is related to diet.  I think she need more diuresis.  Given her hx of acute kidney injury, I want to adjust her medications slowly with close follow up.    -  Increase Lasix to 40 mg Once daily   -  BMET 1 week  -  FU 4 weeks.  Consider increasing ACE inhibitor or Amlodipine at follow up.    2. Chronic diastolic heart failure ([place])  Her weight is up and most of this is related to diet.  I am concerned she may have too much volume as well.  I will increase her Lasix as noted.   3. Morbid obesity ([place]) She is considering weight loss surgery.  Weight loss will improve control of her congestive heart failure.    4. Pulmonary hypertension ([place]) Continue follow up with Pulmonology.  5. Abnormal EKG  Her QRS duration is longer on her ECG.  With her hx of uncontrolled blood pressure, I am concerned that she is at risk of reduced LV function.  I will obtain an echocardiogram to reassess LVF.   Dispo:  Return in about 4 weeks (around 06/21/2017) for Routine Follow Up, w/ Richardson Dopp, PA-C.   Medication Adjustments/Labs and Tests Ordered: Current medicines are reviewed at length with the  patient today.  Concerns regarding medicines are outlined above.  Tests Ordered: Orders Placed This Encounter  Procedures  . Basic Metabolic Panel (BMET)  . EKG 12-Lead  . ECHOCARDIOGRAM COMPLETE   Medication Changes: Meds ordered this encounter  Medications  . furosemide (LASIX) 40 MG tablet    Sig: Take 1 tablet (40 mg total) by mouth daily.    Dispense:  30 tablet    Refill:  3    Dose increase    Signed, Richardson Dopp, PA-C  05/24/2017 1:29 PM    [facility] [place], [place], [place]  [address] Phone: [phone]; Fax: [phone]

## 2017-05-24 NOTE — Progress Notes (Signed)
Tracheostomy Procedure Note  [name] [name] [id number] 19-Feb-1981  Pre Procedure Tracheostomy Information  Trach Brand: Bivona Size: 5.0 Style: Uncuffed Secured by: Velcro   Procedure: trach change    Post Procedure Tracheostomy Information  Trach Brand: Bivona Size: 5.0 Style: Uncuffed Secured by: Velcro   Post Procedure Evaluation:  ETCO2 positive color change from yellow to purple : Yes.   Vital signs:blood pressure 200/117, pulse 87, respirations 15 and pulse oximetry 93 % Patients current condition: stable Complications: No apparent complications Trach site exam: clean, dry Wound care done: dry and 4 x 4 gauze Patient did tolerate procedure well.   Education: none  Prescription needs: Nystatin prn    Additional needs: 4 weeks follow up

## 2017-05-27 ENCOUNTER — Other Ambulatory Visit: Payer: Self-pay | Admitting: Physician Assistant

## 2017-05-27 DIAGNOSIS — I5032 Chronic diastolic (congestive) heart failure: Secondary | ICD-10-CM

## 2017-05-27 DIAGNOSIS — I10 Essential (primary) hypertension: Secondary | ICD-10-CM

## 2017-06-02 ENCOUNTER — Other Ambulatory Visit (HOSPITAL_COMMUNITY): Payer: Self-pay

## 2017-06-02 ENCOUNTER — Other Ambulatory Visit: Payer: Self-pay

## 2017-06-08 ENCOUNTER — Other Ambulatory Visit: Payer: Self-pay

## 2017-06-08 ENCOUNTER — Other Ambulatory Visit: Payer: Medicaid Other | Admitting: *Deleted

## 2017-06-08 ENCOUNTER — Ambulatory Visit (HOSPITAL_COMMUNITY): Payer: Medicaid Other | Attending: Cardiovascular Disease

## 2017-06-08 ENCOUNTER — Encounter: Payer: Self-pay | Admitting: Physician Assistant

## 2017-06-08 DIAGNOSIS — Z930 Tracheostomy status: Secondary | ICD-10-CM | POA: Diagnosis not present

## 2017-06-08 DIAGNOSIS — I081 Rheumatic disorders of both mitral and tricuspid valves: Secondary | ICD-10-CM | POA: Insufficient documentation

## 2017-06-08 DIAGNOSIS — R9431 Abnormal electrocardiogram [ECG] [EKG]: Secondary | ICD-10-CM | POA: Insufficient documentation

## 2017-06-08 DIAGNOSIS — G4733 Obstructive sleep apnea (adult) (pediatric): Secondary | ICD-10-CM | POA: Insufficient documentation

## 2017-06-08 DIAGNOSIS — I313 Pericardial effusion (noninflammatory): Secondary | ICD-10-CM | POA: Diagnosis not present

## 2017-06-08 DIAGNOSIS — E119 Type 2 diabetes mellitus without complications: Secondary | ICD-10-CM | POA: Diagnosis not present

## 2017-06-08 DIAGNOSIS — I5032 Chronic diastolic (congestive) heart failure: Secondary | ICD-10-CM | POA: Insufficient documentation

## 2017-06-08 DIAGNOSIS — I272 Pulmonary hypertension, unspecified: Secondary | ICD-10-CM | POA: Insufficient documentation

## 2017-06-08 LAB — ECHOCARDIOGRAM COMPLETE
Area-P 1/2: 4.4 cm2
E decel time: 169 msec
E/e' ratio: 16.32
FS: 26 % — AB (ref 28–44)
IVS/LV PW RATIO, ED: 0.75
LA ID, A-P, ES: 47 mm
LA diam end sys: 47 mm
LA diam index: 1.59 cm/m2
LA vol A4C: 67.5 ml
LA vol index: 32.4 mL/m2
LA vol: 95.7 mL
LV E/e' medial: 16.32
LV E/e'average: 16.32
LV PW d: 16 mm — AB (ref 0.6–1.1)
LV e' LATERAL: 8.27 cm/s
LVOT SV: 72 mL
LVOT VTI: 23 cm
LVOT area: 3.14 cm2
LVOT diameter: 20 mm
LVOT peak vel: 103 cm/s
Lateral S' vel: 11.1 cm/s
MV Dec: 169
MV Peak grad: 7 mmHg
MV pk E vel: 135 m/s
P 1/2 time: 50 ms
Peak grad: 175 mmHg
RV sys press: 15 mmHg
Reg peak vel: 175 cm/s
TAPSE: 25.7 mm
TDI e' lateral: 8.27
TDI e' medial: 10.6
TR max vel: 175 cm/s

## 2017-06-08 LAB — BASIC METABOLIC PANEL
BUN/Creatinine Ratio: 20 (ref 9–23)
BUN: 22 mg/dL — ABNORMAL HIGH (ref 6–20)
CO2: 25 mmol/L (ref 20–29)
Calcium: 9 mg/dL (ref 8.7–10.2)
Chloride: 100 mmol/L (ref 96–106)
Creatinine, Ser: 1.11 mg/dL — ABNORMAL HIGH (ref 0.57–1.00)
GFR calc Af Amer: 73 mL/min/{1.73_m2} (ref >59)
GFR calc non Af Amer: 64 mL/min/{1.73_m2} (ref >59)
Glucose: 105 mg/dL — ABNORMAL HIGH (ref 65–99)
Potassium: 4.5 mmol/L (ref 3.5–5.2)
Sodium: 137 mmol/L (ref 134–144)

## 2017-06-08 MED ORDER — PERFLUTREN LIPID MICROSPHERE
1.0000 mL | INTRAVENOUS | Status: AC | PRN
  Administered 2017-06-08: 2 mL via INTRAVENOUS

## 2017-06-09 ENCOUNTER — Encounter: Payer: Self-pay | Admitting: Physician Assistant

## 2017-06-22 ENCOUNTER — Ambulatory Visit: Payer: Self-pay | Admitting: Physician Assistant

## 2017-07-06 ENCOUNTER — Telehealth: Payer: Self-pay | Admitting: Adult Health

## 2017-07-06 ENCOUNTER — Ambulatory Visit (HOSPITAL_COMMUNITY): Payer: Self-pay

## 2017-07-06 NOTE — Telephone Encounter (Addendum)
Pt seen 9.19.18 by TP and ONO on room air w/ uncapped trach was ordered  Results received and reviewed by TP: check with patient to ensure the study was indeed done with her trach uncapped.  Study shows she needs O2 2lpm at bedtime w/ uncapped trach if she is not going to wear CPAP (with capped trach).  Spoke with patient who verified that her ONO was indeed done with her trach uncapped Pt stated that she would like to retry wearing her CPAP rather than nocturnal O2 but has not heard from [facility] regarding her CPAP mask - verified that order was indeed sent to [facility] at last ov on 9.19.18.  Advised patient will check on this for her.  TP please, thank you

## 2017-07-12 ENCOUNTER — Ambulatory Visit: Payer: Self-pay | Admitting: Physician Assistant

## 2017-07-18 ENCOUNTER — Telehealth: Payer: Self-pay | Admitting: Adult Health

## 2017-07-18 NOTE — Telephone Encounter (Signed)
Per Rodena Piety, pt had ONO on 11.7.18 and she needs to be seen here in the office prior to 12.6.18 LMOM TCB x1 for pt to schedule appt with TP - we have plenty of openings this week

## 2017-07-20 NOTE — Telephone Encounter (Signed)
Patient is scheduled to see SG on 12.5.18

## 2017-07-26 ENCOUNTER — Telehealth: Payer: Self-pay | Admitting: Cardiology

## 2017-07-26 NOTE — Telephone Encounter (Signed)
Pt calling about Amlodipine recall.  Advised this recall is related combination medications only and if her medication had been recalled her pharmacy would have contacted her and she is safe to continue current medications.  She thanked me for the c/b and information.

## 2017-07-26 NOTE — Telephone Encounter (Signed)
New Message  Pt c/o medication issue:  1. Name of Medication: amlodipine   2. How are you currently taking this medication (dosage and times per day)? 14m  3. Are you having a reaction (difficulty breathing--STAT)? no  4. What is your medication issue? recall

## 2017-07-26 NOTE — Progress Notes (Signed)
CARDIOLOGY OFFICE NOTE  Date:  07/27/2017    [name] [name] Date of Birth: 1980/12/18 Medical Record #[id number]  PCP:  Flossie Buffy, NP  Cardiologist:  [facility]    Chief Complaint  Patient presents with  . Shortness of Breath    Follow up visit - seen for Dr. Marlou Porch    History of Present Illness: [name] [name] is a 36 y.o. female who presents today for a follow up visit. Seen for Dr. Marlou Porch.   She has a hx of HTN, OSA, obesity, gestational diabetes.  She was admitted in 10/17 respiratory failure in the setting of HTN emergency with pulmonary edema and OHS/OSA requiring intubation. She ultimately underwent tracheostomy due to prolonged respiratory failure. She did have prolonged sinus pauses during coughing and straining against the ventilator. This was reviewed with EP and she was not felt to need a PPM as her pauses were likely related to underlying OSA/OHS. Echocardiogram did demonstrate pulmonary hypertension with PASP 42 and normal ejection fraction.  She was admitted in 1/18 with acute renal failure in the setting of dehydration. She has been followed in our hypertension clinic to help with control of blood pressure. Last seen by Dr. Marlou Porch 6/18. She was started back on low-dose Lasix for fluid management.  Last seen in October by Richardson Dopp, PA - felt to be doing ok but BP was up. Weight was up. Did not keep follow up back here with him.   Comes in today. Here alone. BP is sky high. She has just had her mid day doses and admits that she does not take her medicines as prescribed - especially misses the mid day dosing of her Imdur/hydralazine.  BP was 196/119 in the trach clinic today - unclear what size cuff was actually used - probably needs thigh size cuff. Says she feels fine. Not short of breath. No headache. Still has her trach in place - trying to use CPAP and then will have discussion about removal. No chest pain. She is quite upset about her weight today.  Says she really does not wish to have gastric bypass surgery.   Admits that she really loves sugar. She likes juice - especially cran-raspberry. She has a 30 year old daughter. 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   Past Medical History:  Diagnosis Date  . Chronic diastolic heart failure ([place])    Echo 10/12: EF 50-55 // b. Echo 10/17: EF 50-55, mild BAE, PASP 42 // Echo 10/18: Mild concentric LVH, EF 55-60, normal wall motion, mild MAC, severe LAE, mild TR, PASP 15, trivial pericardial effusion  . CKD (chronic kidney disease) stage 4, GFR 15-29 ml/min (HCC) 10/25/2016   admitted in 2/18 with SCr of 10 in the setting of dehydration from illness, etc >> improved to 5 at DC  . DM2 (diabetes mellitus, type 2) (HCC)    A1c 7.2 in 05/2016  . Gestational diabetes mellitus in pregnancy 05/24/2013  . HTN in pregnancy, chronic 05/24/2013  . Hypertensive heart disease with CHF (congestive heart failure) ([place]) 05/25/2016  . Morbid obesity ([place])   . OSA (obstructive sleep apnea) 07/09/2011   s/p Trach 2/2 prolonged respiratory failure during admx for CHF in 05/2016  . Pulmonary hypertension ([place])    secondary from CHF/OSA  . Sinus tachycardia     Past Surgical History:  Procedure Laterality Date  . TRACHEOSTOMY TUBE PLACEMENT N/A 06/11/2016   Procedure: TRACHEOSTOMY;  Surgeon:  Melida Quitter, MD;  Location: [place];  Service: ENT;  Laterality: N/A;     Medications: Current Meds  Medication Sig  . amLODipine (NORVASC) 5 MG tablet TAKE 1 TABLET BY MOUTH EVERY DAY  . carvedilol (COREG) 25 MG tablet Take 1 tablet (25 mg total) by mouth 2 (two) times daily.  . fluticasone (FLONASE) 50 MCG/ACT nasal spray Place 2 sprays into both nostrils 2 (two) times daily.  . furosemide (LASIX) 40 MG tablet Take 1 tablet (40 mg total) by mouth daily.  . hydrALAZINE (APRESOLINE) 100 MG tablet Take 1 tablet (100 mg total) by mouth 3 (three) times daily.  . isosorbide dinitrate (ISORDIL)  30 MG tablet TAKE 1 TABLET BY MOUTH 3 TIMES A DAY  . nystatin ointment (MYCOSTATIN) Apply topically 2 (two) times daily.  . [DISCONTINUED] lisinopril (PRINIVIL,ZESTRIL) 5 MG tablet TAKE 1 TABLET BY MOUTH EVERY DAY     Allergies: Allergies  Allergen Reactions  . No Known Allergies     Social History: The patient  reports that  has never smoked. she has never used smokeless tobacco. She reports that she does not drink alcohol or use drugs.   Family History: The patient's family history includes Birth defects in her paternal grandfather; Diabetes in her other; Hypertension in her mother.   Review of Systems: Please see the history of present illness.   Otherwise, the review of systems is positive for none.   All other systems are reviewed and negative.   Physical Exam: VS:  BP (!) 180/142   Pulse 64   Ht _0  (1.676 m)   Wt (!) 403 lb (182.8 kg)   BMI 65.05 kg/m  .  BMI Body mass index is 65.05 kg/m.  Wt Readings from Last 3 Encounters:  07/27/17 (!) 403 lb (182.8 kg)  05/24/17 (!) 382 lb 6.4 oz (173.5 kg)  05/11/17 (!) 382 lb 11.2 oz (173.6 kg)   Using the thigh cuff - diastolic BP was 517. Using the large cuff on the forearm - palpated 001 systolic.   General: Pleasant. She is morbidly obese. She is alert and in no acute distress.   HEENT: Normal.  Neck: Supple, no JVD, carotid bruits, or masses noted.  Cardiac: Heart tones are distant. Legs are full.   Respiratory:  Lungs are clear to auscultation bilaterally with normal work of breathing.  GI: Soft and nontender.  MS: No deformity or atrophy. Gait and ROM intact.  Skin: Warm and dry. Color is normal.  Neuro:  Strength and sensation are intact and no gross focal deficits noted.  Psych: Alert, appropriate and with normal affect.   LABORATORY DATA:  EKG:  EKG is not ordered today.   Lab Results  Component Value Date   WBC 8.4 09/20/2016   HGB 9.1 (L) 09/20/2016   HCT 27.6 (L) 09/20/2016   PLT 469 (H)  09/20/2016   GLUCOSE 105 (H) 06/08/2017   CHOL 176 09/15/2016   TRIG 250.0 (H) 09/15/2016   HDL 10.60 (L) 09/15/2016   LDLDIRECT 99.0 09/15/2016   LDLCALC 92 05/26/2016   ALT 16 09/16/2016   AST 18 09/16/2016   NA 137 06/08/2017   K 4.5 06/08/2017   CL 100 06/08/2017   CREATININE 1.11 (H) 06/08/2017   BUN 22 (H) 06/08/2017   CO2 25 06/08/2017   TSH 2.615 05/14/2011   INR 1.27 09/18/2016   HGBA1C 6.2 09/15/2016     BNP (last 3 results) Recent Labs    09/15/16 1418  BNP 175.1*    ProBNP (last 3 results) No results for input(s): PROBNP in the last 8760 hours.   Other Studies Reviewed Today:  Echo Study Conclusions 05/2017  - Left ventricle: The cavity size was normal. There was mild   concentric hypertrophy. Systolic function was normal. The   estimated ejection fraction was in the range of 55% to 60%. Wall   motion was normal; there were no regional wall motion   abnormalities. The study is not technically sufficient to allow   evaluation of LV diastolic function. - Aortic valve: Transvalvular velocity was within the normal range.   There was no stenosis. There was no regurgitation. - Mitral valve: Mildly calcified annulus. Transvalvular velocity   was within the normal range. There was no evidence for stenosis.   There was no regurgitation. - Left atrium: The atrium was severely dilated. - Right ventricle: The cavity size was normal. Wall thickness was   normal. Systolic function was normal. - Tricuspid valve: There was mild regurgitation. - Pulmonary arteries: Systolic pressure was within the normal   range. PA peak pressure: 15 mm Hg (S). - Pericardium, extracardiac: A trivial pericardial effusion was   identified.   Echo 05/26/16 EF 50-55, mild BAE, PASP 42  Renal artery duplex 10/12 Normal renal arteries bilaterally  Echo 06/17/11: mild LVH, EF 50-55%, mild LAE.   Assessment/Plan:   1. Hypertensive heart disease with chronic diastolic  congestive heart failure (HCC) BP remains uncontrolled.  She has had history of acute kidney injury. Will try to increase her ACE today. She needs to put alerts on her phone to stay compliant with her TID dosing medicines. Compliance is key as well as a simplified regimen. We've had a long discussion about making her health a priority. Will get her back to the hypertension clinic and see what progress can be made. Can then decide about follow up. Lab today. Will probably need lab today.   2. Chronic diastolic heart failure ([place])  Her weight continues to climb. She is not short of breath. I have left her on current dose of Lasix. Lab today  3. Morbid obesity ([place]) She is quite hesitant about weight loss surgery. She loves sugar. We've had along discussion today about making her health a priority. My tips were given. Her prognosis is quite poor going forward without the necessary lifestyle changes.   4. Pulmonary hypertension ([place]) Continue follow up with Pulmonology. She is seeing them tomorrow to get her oxygen re certified.   Current medicines are reviewed with the patient today.  The patient does not have concerns regarding medicines other than what has been noted above.  The following changes have been made:  See above.  Labs/ tests ordered today include:    Orders Placed This Encounter  Procedures  . Basic metabolic panel     Disposition:   FU in the HTN clinic.   Patient is agreeable to this plan and will call if any problems develop in the interim.   SignedTruitt Merle, NP  07/27/2017 4:54 PM  [facility] [address] [place], [place]  [address] Phone: [phone] Fax: [phone]

## 2017-07-27 ENCOUNTER — Ambulatory Visit (HOSPITAL_COMMUNITY)
Admission: RE | Admit: 2017-07-27 | Discharge: 2017-07-27 | Disposition: A | Discharge status: Home / Self Care | Payer: Medicaid Other | Source: Ambulatory Visit | Attending: Acute Care | Admitting: Acute Care

## 2017-07-27 ENCOUNTER — Encounter: Payer: Self-pay | Admitting: Nurse Practitioner

## 2017-07-27 ENCOUNTER — Ambulatory Visit: Payer: Self-pay | Admitting: Acute Care

## 2017-07-27 ENCOUNTER — Ambulatory Visit: Payer: Medicaid Other | Admitting: Nurse Practitioner

## 2017-07-27 VITALS — BP 180/142 | HR 64 | Ht 66.0 in | Wt >= 6400 oz

## 2017-07-27 DIAGNOSIS — Z930 Tracheostomy status: Secondary | ICD-10-CM

## 2017-07-27 DIAGNOSIS — G4733 Obstructive sleep apnea (adult) (pediatric): Secondary | ICD-10-CM | POA: Diagnosis not present

## 2017-07-27 DIAGNOSIS — I10 Essential (primary) hypertension: Secondary | ICD-10-CM | POA: Diagnosis not present

## 2017-07-27 DIAGNOSIS — I110 Hypertensive heart disease with heart failure: Secondary | ICD-10-CM

## 2017-07-27 MED ORDER — LISINOPRIL 10 MG PO TABS: 10.0000 mg | ORAL_TABLET | Freq: Every day | ORAL | 3 refills | Status: DC

## 2017-07-27 NOTE — Progress Notes (Signed)
Tracheostomy Procedure Note  SHONDELL FABEL [id number] [birth year]  Pre Procedure Tracheostomy Information  Trach Brand: Bivona Size: 5.0 Style: Uncuffed Secured by: Velcro   Procedure: trach change    Post Procedure Tracheostomy Information  Trach Brand: bivona Size: 5.0 Style: Uncuffed Secured by: Velcro   Post Procedure Evaluation:  ETCO2 positive color change from yellow to purple : Yes.   Vital signs:blood pressure 196/119, pulse 96, respirations 20 and pulse oximetry 94 % Patients current condition: stable Complications: No apparent complications Trach site exam: clean, dry Wound care done: 4 x 4 gauze Patient did tolerate procedure well.   Education: none  Prescription needs: none    Additional needs: none

## 2017-07-27 NOTE — Progress Notes (Deleted)
History of Present Illness [name] [name] is a 36 y.o. female with known history significant sleep apnea with CPAP use , HTN , Obesity  and chronic tracheostomy dependence.   07/27/2017 Follow up CPAP therapy: Pt. Presents for CPAP follow up. She was seen earlier in the day for trach change by [name], NP at the trach clinic.  Patient has not been wearing her CPAP. She states she does not have a mask that fits therefore she is not wearing her CPAP. She states this has been going on for about 1 month.  She did not call the office to let us know we needed to place an order for her mask. At this point Victor no new issues in regards to tracheostomy.  She  remains independent at home, with no worsening in respiratory status..      She is currently working with the pulmonary office with hopes to find a CPAP mask that will fit.  She continues to cap her trach during the day and keep it open at night when she does not have a CPAP mask in place.  Test Results: 07/27/2017>> Trach change Trach Brand: Bivona Size: 5.0 Style: Uncuffed Secured by: Velcro  CBC Latest Ref Rng & Units 09/20/2016 09/19/2016 09/18/2016  WBC 4.0 - 10.5 K/uL 8.4 8.7 9.1  Hemoglobin 12.0 - 15.0 g/dL 9.1(L) 9.4(L) 9.8(L)  Hematocrit 36.0 - 46.0 % 27.6(L) 28.4(L) 29.7(L)  Platelets 150 - 400 K/uL 469(H) 484(H) 469(H)    BMP Latest Ref Rng & Units 06/08/2017 04/20/2017 03/31/2017  Glucose 65 - 99 mg/dL 105(H) 95 92  BUN 6 - 20 mg/dL 22(H) 22(H) 20  Creatinine 0.57 - 1.00 mg/dL 1.11(H) 1.11(H) 1.01(H)  BUN/Creat Ratio 9 - _0 Sodium 134 - 144 mmol/L 137 140 141  Potassium 3.5 - 5.2 mmol/L 4.5 4.9 4.6  Chloride 96 - 106 mmol/L 100 96 99  CO2 20 - 29 mmol/L _1 Calcium 8.7 - 10.2 mg/dL 9.0 9.4 9.5    BNP    Component Value Date/Time   BNP 175.1 (H) 09/15/2016 1418    ProBNP No results found for: PROBNP  PFT No results found for: FEV1PRE, FEV1POST, FVCPRE, FVCPOST, TLC, DLCOUNC, PREFEV1FVCRT,  PSTFEV1FVCRT  No results found.   Past medical hx Past Medical History:  Diagnosis Date  . Chronic diastolic heart failure ([place])    Echo 10/12: EF 50-55 // b. Echo 10/17: EF 50-55, mild BAE, PASP 42 // Echo 10/18: Mild concentric LVH, EF 55-60, normal wall motion, mild MAC, severe LAE, mild TR, PASP 15, trivial pericardial effusion  . CKD (chronic kidney disease) stage 4, GFR 15-29 ml/min (HCC) 10/25/2016   admitted in 2/18 with SCr of 10 in the setting of dehydration from illness, etc >> improved to 5 at DC  . DM2 (diabetes mellitus, type 2) (HCC)    A1c 7.2 in 05/2016  . Gestational diabetes mellitus in pregnancy 05/24/2013  . HTN in pregnancy, chronic 05/24/2013  . Hypertensive heart disease with CHF (congestive heart failure) ([place]) 05/25/2016  . Morbid obesity ([place])   . OSA (obstructive sleep apnea) 07/09/2011   s/p Trach 2/2 prolonged respiratory failure during admx for CHF in 05/2016  . Pulmonary hypertension ([place])    secondary from CHF/OSA  . Sinus tachycardia      Social History   Tobacco Use  . Smoking status: Never Smoker  . Smokeless tobacco: Never Used  Substance Use Topics  . Alcohol use: No  .  Drug use: No    Ms.Burdell reports that  has never smoked. she has never used smokeless tobacco. She reports that she does not drink alcohol or use drugs.  Tobacco Cessation: Counseling given: Not Answered   Past surgical hx, Family hx, Social hx all reviewed.  Current Outpatient Medications on File Prior to Visit  Medication Sig  . amLODipine (NORVASC) 5 MG tablet TAKE 1 TABLET BY MOUTH EVERY DAY  . carvedilol (COREG) 25 MG tablet Take 1 tablet (25 mg total) by mouth 2 (two) times daily.  . fluticasone (FLONASE) 50 MCG/ACT nasal spray Place 2 sprays into both nostrils 2 (two) times daily.  . furosemide (LASIX) 40 MG tablet Take 1 tablet (40 mg total) by mouth daily.  . hydrALAZINE (APRESOLINE) 100 MG tablet Take 1 tablet (100 mg total) by mouth 3 (three) times daily.    . isosorbide dinitrate (ISORDIL) 30 MG tablet TAKE 1 TABLET BY MOUTH 3 TIMES A DAY  . lisinopril (PRINIVIL,ZESTRIL) 5 MG tablet TAKE 1 TABLET BY MOUTH EVERY DAY  . nystatin ointment (MYCOSTATIN) Apply topically 2 (two) times daily.   No current facility-administered medications on file prior to visit.      Allergies  Allergen Reactions  . No Known Allergies     Review Of Systems:  Constitutional:   No  weight loss, night sweats,  Fevers, chills, fatigue, or  lassitude.  HEENT:   No headaches,  Difficulty swallowing,  Tooth/dental problems, or  Sore throat,                No sneezing, itching, ear ache, nasal congestion, post nasal drip,   CV:  No chest pain,  Orthopnea, PND, swelling in lower extremities, anasarca, dizziness, palpitations, syncope.   GI  No heartburn, indigestion, abdominal pain, nausea, vomiting, diarrhea, change in bowel habits, loss of appetite, bloody stools.   Resp: No shortness of breath with exertion or at rest.  No excess mucus, no productive cough,  No non-productive cough,  No coughing up of blood.  No change in color of mucus.  No wheezing.  No chest wall deformity  Skin: no rash or lesions.  GU: no dysuria, change in color of urine, no urgency or frequency.  No flank pain, no hematuria   MS:  No joint pain or swelling.  No decreased range of motion.  No back pain.  Psych:  No change in mood or affect. No depression or anxiety.  No memory loss.   Vital Signs There were no vitals taken for this visit.   Physical Exam:  General- No distress,  A&Ox3 ENT: No sinus tenderness, TM clear, pale nasal mucosa, no oral exudate,no post nasal drip, no LAN Cardiac: S1, S2, regular rate and rhythm, no murmur Chest: No wheeze/ rales/ dullness; no accessory muscle use, no nasal flaring, no sternal retractions Abd.: Soft Non-tender Ext: No clubbing cyanosis, edema Neuro:  normal strength Skin: No rashes, warm and dry Psych: normal mood and  behavior   Assessment/Plan  No problem-specific Assessment & Plan notes found for this encounter.    Magdalen Spatz, NP 07/27/2017  4:17 PM

## 2017-07-27 NOTE — Progress Notes (Signed)
Name: [name] [name] MRN: [id number] DOB: Dec 26, 1980    ADMISSION DATE:  07/27/2017 CONSULTATION DATE:  12/5  CHIEF COMPLAINT: Tracheostomy follow-up  BRIEF PATIENT DESCRIPTION:  36 year old female with known history significant sleep apnea, and chronic tracheostomy dependence.  I follow her for tracheostomy maintenance.  At this point no new issues in regards to tracheostomy.  Donald remaining independent at home, no worsening in respiratory.  She is currently working with the pulmonary office with hopes to find a CPAP mask that will fit.  She continues to cap her trach during the day and keep it open at night when she does not have a CPAP mask in place.   REVIEW OF SYSTEMS:   Constitutional: Negative for fever, chills, weight loss, malaise/fatigue and diaphoresis.  HENT: Negative for hearing loss, ear pain, nosebleeds, congestion, sore throat, neck pain, tinnitus and ear discharge.   Eyes: Negative for blurred vision, double vision, photophobia, pain, discharge and redness.  Respiratory: Negative for cough, hemoptysis, sputum production, shortness of breath, wheezing and stridor.   Cardiovascular: Negative for chest pain, palpitations, orthopnea, claudication, leg swelling and PND.  Gastrointestinal: Negative for heartburn, nausea, vomiting, abdominal pain, diarrhea, constipation, blood in stool and melena.  Genitourinary: Negative for dysuria, urgency, frequency, hematuria and flank pain.  Musculoskeletal: Negative for myalgias, back pain, joint pain and falls.  Skin: Negative for itching and rash.  Neurological: Negative for dizziness, tingling, tremors, sensory change, speech change, focal weakness, seizures, loss of consciousness, weakness and headaches.  Endo/Heme/Allergies: Negative for environmental allergies and polydipsia. Does not bruise/bleed easily.  SUBJECTIVE:  Doing well VITAL SIGNS:  Pulse rate 96 respirations 20 blood pressure 196/119 saturations 94% PHYSICAL  EXAMINATION: General: 36 year old African-American female ambulatory, well-appearing, no acute distress Neuro: Awake, alert, no focal deficits HEENT: She has a #5 uncuffed Bivona tracheostomy.  Her tracheostomy site is unremarkable.  There is no significant drainage. Cardiovascular: Regular rate and rhythm no murmur rub or gallop Lungs: Clear to auscultation no accessory muscle use Abdomen: Soft, nontender, no organomegaly Musculoskeletal: Equal strength and bulk Skin: Warm and dry  Procedure: Tracheostomy change.  Size #5 uncuffed Bivona trach    ASSESSMENT / PLAN:  Chronic tracheostomy dependence Obstructive sleep apnea Hypertension  Discussion 36 year old female well-known to me followed here at the tracheostomy clinic.  Currently continue to work on mask fit with CPAP.  She continues to cap the trach during the day, and off At night when not using CPAP.  She is proven she does not need tracheostomy from an airway standpoint however I will not decannulate her until she can be successful and compliant with CPAP at night.  Plan She has a follow-up in our office today, with plan to reevaluate mask Return trach clinic 5 weeks for routine tracheostomy change She knows to call me if she has any issues She has follow-up today with cardiology, given her hypertension.     Erick Colace ACNP-BC [place] Pager # [phone] OR # [phone] if no answer  07/27/2017, 11:46 AM

## 2017-07-27 NOTE — Patient Instructions (Addendum)
We will be checking the following labs today - BMET   Medication Instructions:    Continue with your current medicines. BUT   I am increasing your Lisinopril to 10 mg a day - take 2 of your 5 mg tablets to use up - I have sent the RX for the 10 mg to your drug store.   Put alerts on your phone to remind you about taking your medicines.    Testing/Procedures To Be Arranged:  N/A  Follow-Up:   See Georgina Peer in the HTN clinic in about 10 days. 08/17/17 @ 11:30     Other Special Instructions:  Here are my tips to lose weight:  1. Drink only water. You do not need milk, juice, tea, soda or diet soda.  2. Do not eat anything "white". This includes white bread, potatoes, rice or mayo  3. Stay away from fried foods and sweets  4. Your portion should be the size of the palm of your hand.  5. Know what your weaknesses are and avoid.   6. Try to increase your activity level           If you need a refill on your cardiac medications before your next appointment, please call your pharmacy.   Call the [place] office at [phone] if you have any questions, problems or concerns.

## 2017-07-28 ENCOUNTER — Other Ambulatory Visit: Payer: Self-pay | Admitting: *Deleted

## 2017-07-28 ENCOUNTER — Encounter: Payer: Self-pay | Admitting: Acute Care

## 2017-07-28 ENCOUNTER — Ambulatory Visit: Payer: Medicaid Other | Admitting: Acute Care

## 2017-07-28 VITALS — BP 144/66 | HR 88 | Ht 65.0 in | Wt 398.8 lb

## 2017-07-28 DIAGNOSIS — G4733 Obstructive sleep apnea (adult) (pediatric): Secondary | ICD-10-CM | POA: Diagnosis not present

## 2017-07-28 DIAGNOSIS — I119 Hypertensive heart disease without heart failure: Secondary | ICD-10-CM

## 2017-07-28 DIAGNOSIS — Z79899 Other long term (current) drug therapy: Secondary | ICD-10-CM

## 2017-07-28 LAB — BASIC METABOLIC PANEL
BUN/Creatinine Ratio: 17 (ref 9–23)
BUN: 16 mg/dL (ref 6–20)
CO2: 28 mmol/L (ref 20–29)
Calcium: 9.2 mg/dL (ref 8.7–10.2)
Chloride: 99 mmol/L (ref 96–106)
Creatinine, Ser: 0.95 mg/dL (ref 0.57–1.00)
GFR calc Af Amer: 88 mL/min/{1.73_m2} (ref >59)
GFR calc non Af Amer: 76 mL/min/{1.73_m2} (ref >59)
Glucose: 104 mg/dL — ABNORMAL HIGH (ref 65–99)
Potassium: 4.4 mmol/L (ref 3.5–5.2)
Sodium: 141 mmol/L (ref 134–144)

## 2017-07-28 NOTE — Patient Instructions (Signed)
  Cap your  trach during the day and keep it open( Uncapped )  at night with nocturnal oxygen at 2 L  when you  do not have a CPAP mask in place. Continue on CPAP at bedtime.  Remember [name] cannot remove your trach if we cannot be successful with CPAP use 100% of the time. We will place order for gel mask and mask fitting with [name]. Goal is to wear for at least 6 hours each night for maximal clinical benefit. Continue to work on weight loss, as the link between excess weight  and sleep apnea is well established.  Do not drive if sleepy. Remember to clean mask, tubing, filter, and reservoir once weekly with soapy water.  Follow up with [name], NP or Anetta Olvera NP  In 8 weeks or before as needed.  Follow up with [name] at the trach clinic in 5 weeks. Please contact office for sooner follow up if symptoms do not improve or worsen or seek emergency care

## 2017-07-28 NOTE — Progress Notes (Signed)
I have reviewed and agree with assessment/plan.  Chesley Mires, MD [facility] Pulmonary/Critical Care 07/28/2017, 12:16 PM Pager:  [phone]

## 2017-07-28 NOTE — Assessment & Plan Note (Addendum)
[name] [name] is secure and clean No stridor Plan: Cap your  trach during the day and keep it open( Uncapped )  at night with nocturnal oxygen at 2 L  when you  do not have a CPAP mask in place. Restart  CPAP at bedtime.  When you wear CPAP, remember to cap your trach. Remember [name] [name] cannot remove your trach if we cannot be successful with CPAP use 100% of the time. We will place order for gel mask and mask fitting with [name] [name]. You should get a call to schedule Goal is to wear for at least 6 hours each night for maximal clinical benefit. Continue to work on weight loss, as the link between excess weight  and sleep apnea is well established.  Do not drive if sleepy. Remember to clean mask, tubing, filter, and reservoir once weekly with soapy water.  Follow up with [name] Edison, NP or [name] Takach NP  In 8 weeks or before as needed.  Follow up with [name] [name] at the trach clinic in 5 weeks. Please contact office for sooner follow up if symptoms do not improve or worsen or seek emergency care

## 2017-07-28 NOTE — Progress Notes (Signed)
History of Present Illness Dyonna Jaspers Desaulniers is a 36 y.o. female with known history significant sleep apnea, and chronic tracheostomy dependence.She is followed by [name], NP in the trach clinic. She was followed by Dr. [name] in the  office for OSA.  HPI: 36 year old female followed for obstructive sleep apnea and chronic trach since 05/2016 She was diagnosed with sleep apnea October 2012 with an AHI at 73. Hospital admission October 2017 with critical illness requiring tracheostomy.  07/28/2017 Follow Up CPAP/ O&O: Pt. Presents for follow up. She has not been using her CPAP as she states her mask does not fit. She had been told by [name], NP at her last visit on 9/19 to get a gel mask.She has not been using her CPAP as she does not have a mask that fits.She states when she called [facility] they stated the mask had not been ordered. Last O&O done 06/2017 indicated need for 2 L oxygen at bedtime with uncapped trach.  She states she is wearing nocturnal oxygen with trach uncapped at bedtime at 2 L.She was seen in the trach clinic 07/27/2017, and she had a trach change ( Size #5 uncuffed Bivona trach). She states that she is having no issues with her trach. She states she has her  trach changes every 5 weeks, next scheduled for 09/01/2017 . Per [employer] note, she has  proven she does not need tracheostomy from an airway standpoint, however he will not decannulate her until she can be successful and compliant with CPAP at night.She understands this. She states she has some anxiety about decannulation. She spoke with [name] about this yesterday, and she is going to try easing her way into CPAP usage for longer periods of time once she has the new mask.She did follow up with cardiology 12/5, and they have made adjustments to her antihypertensive medications.She denies fever, chest pain, orthopnea or hemoptysis.   Test Results: ONO 06/29/2017>>Study shows she needs O2 2lpm at bedtime w/  uncapped trach if she is not going to wear CPAP (with capped trach). Pt. States she prefers to wear CPAP and not use nocturnal oxygen. 11/16/2016 sleep study showed severe sleep apnea, AHI 59, optimal C Pap pressure was 12 cm of H2O (done with capped trach )  06/08/2017 Echo>> EF : 55-60%, PAP 15 mm Hg   CBC Latest Ref Rng & Units 09/20/2016 09/19/2016 09/18/2016  WBC 4.0 - 10.5 K/uL 8.4 8.7 9.1  Hemoglobin 12.0 - 15.0 g/dL 9.1(L) 9.4(L) 9.8(L)  Hematocrit 36.0 - 46.0 % 27.6(L) 28.4(L) 29.7(L)  Platelets 150 - 400 K/uL 469(H) 484(H) 469(H)    BMP Latest Ref Rng & Units 07/27/2017 06/08/2017 04/20/2017  Glucose 65 - 99 mg/dL 104(H) 105(H) 95  BUN 6 - 20 mg/dL 16 22(H) 22(H)  Creatinine 0.57 - 1.00 mg/dL 0.95 1.11(H) 1.11(H)  BUN/Creat Ratio 9 - _0 Sodium 134 - 144 mmol/L 141 137 140  Potassium 3.5 - 5.2 mmol/L 4.4 4.5 4.9  Chloride 96 - 106 mmol/L 99 100 96  CO2 20 - 29 mmol/L _1 Calcium 8.7 - 10.2 mg/dL 9.2 9.0 9.4    BNP    Component Value Date/Time   BNP 175.1 (H) 09/15/2016 1418      Past medical hx Past Medical History:  Diagnosis Date  . Chronic diastolic heart failure ([place])    Echo 10/12: EF 50-55 // b. Echo 10/17: EF 50-55, mild BAE, PASP 42 // Echo 10/18: Mild  concentric LVH, EF 55-60, normal wall motion, mild MAC, severe LAE, mild TR, PASP 15, trivial pericardial effusion  . CKD (chronic kidney disease) stage 4, GFR 15-29 ml/min (HCC) 10/25/2016   admitted in 2/18 with SCr of 10 in the setting of dehydration from illness, etc >> improved to 5 at DC  . DM2 (diabetes mellitus, type 2) (HCC)    A1c 7.2 in 05/2016  . Gestational diabetes mellitus in pregnancy 05/24/2013  . HTN in pregnancy, chronic 05/24/2013  . Hypertensive heart disease with CHF (congestive heart failure) ([place]) 05/25/2016  . Morbid obesity ([place])   . OSA (obstructive sleep apnea) 07/09/2011   s/p Trach 2/2 prolonged respiratory failure during admx for CHF in 05/2016  . Pulmonary  hypertension ([place])    secondary from CHF/OSA  . Sinus tachycardia      Social History   Tobacco Use  . Smoking status: Never Smoker  . Smokeless tobacco: Never Used  Substance Use Topics  . Alcohol use: No  . Drug use: No    Ms.Seawright reports that  has never smoked. she has never used smokeless tobacco. She reports that she does not drink alcohol or use drugs.  Tobacco Cessation: Never smoker  Past surgical hx, Family hx, Social hx all reviewed.  Current Outpatient Medications on File Prior to Visit  Medication Sig  . amLODipine (NORVASC) 5 MG tablet TAKE 1 TABLET BY MOUTH EVERY DAY  . fluticasone (FLONASE) 50 MCG/ACT nasal spray Place 2 sprays into both nostrils 2 (two) times daily.  . furosemide (LASIX) 40 MG tablet Take 1 tablet (40 mg total) by mouth daily.  . hydrALAZINE (APRESOLINE) 100 MG tablet Take 1 tablet (100 mg total) by mouth 3 (three) times daily.  . isosorbide dinitrate (ISORDIL) 30 MG tablet TAKE 1 TABLET BY MOUTH 3 TIMES A DAY  . lisinopril (PRINIVIL,ZESTRIL) 10 MG tablet Take 1 tablet (10 mg total) by mouth daily.  Marland Kitchen nystatin ointment (MYCOSTATIN) Apply topically 2 (two) times daily.   No current facility-administered medications on file prior to visit.      Allergies  Allergen Reactions  . No Known Allergies     Review Of Systems:  Constitutional:   No  weight loss, night sweats,  Fevers, chills, fatigue, or  lassitude.  HEENT:   No headaches,  Difficulty swallowing,  Tooth/dental problems, or  Sore throat,                No sneezing, itching, ear ache, nasal congestion, post nasal drip,   CV:  No chest pain,  Orthopnea, PND, swelling in lower extremities, anasarca, dizziness, palpitations, syncope.   GI  No heartburn, indigestion, abdominal pain, nausea, vomiting, diarrhea, change in bowel habits, loss of appetite, bloody stools.   Resp: No shortness of breath with exertion or at rest.  No excess mucus, no productive cough,  No non-productive  cough,  No coughing up of blood.  No change in color of mucus.  No wheezing.  No chest wall deformity  Skin: no rash or lesions.  GU: no dysuria, change in color of urine, no urgency or frequency.  No flank pain, no hematuria   MS:  No joint pain or swelling.  No decreased range of motion.  No back pain.  Psych:  No change in mood or affect. No depression or anxiety.  No memory loss.   Vital Signs BP (!) 144/66 (BP Location: Left Arm, Cuff Size: Normal)   Pulse 88   Ht _0  (1.651  m)   Wt (!) 398 lb 12.8 oz (180.9 kg)   SpO2 92%   BMI 66.36 kg/m    Physical Exam:  General- No distress,  A&Ox3, pleasant ENT: No sinus tenderness, TM clear, pale nasal mucosa, no oral exudate,no post nasal drip, no LAN, trach is intact, clean and secure Cardiac: S1, S2, regular rate and rhythm, no murmur Chest: No wheeze/ rales/ dullness; no accessory muscle use, no nasal flaring, no sternal retractions Abd.: Soft Non-tender, obese Ext: No clubbing cyanosis, trace BLE edema Neuro:  normal strength, cranial nerves intact, appropriate Skin: No rashes, warm and dry Psych: normal mood and behavior, tearful at times   Assessment/Plan  OSA (obstructive sleep apnea) [name] is secure and clean No stridor Plan: Cap your  trach during the day and keep it open( Uncapped )  at night with nocturnal oxygen at 2 L  when you  do not have a CPAP mask in place. Restart  CPAP at bedtime.  When you wear CPAP, remember to cap your trach. Remember [name] cannot remove your trach if we cannot be successful with CPAP use 100% of the time. We will place order for gel mask and mask fitting with [name]. You should get a call to schedule Goal is to wear for at least 6 hours each night for maximal clinical benefit. Continue to work on weight loss, as the link between excess weight  and sleep apnea is well established.  Do not drive if sleepy. Remember to clean mask, tubing, filter, and reservoir once weekly with soapy  water.  Follow up with [name], NP or Jaxon Flatt NP  In 8 weeks or before as needed.  Follow up with [name] at the trach clinic in 5 weeks. Please contact office for sooner follow up if symptoms do not improve or worsen or seek emergency care    HTN Blood Pressure 07/27/2017 at trach clinic 196/119 Blood Pressure 07/27/2017 in cards office 180/142 Blood Pressure better controlled 07/28/2017 in pulmonary  at 144/66 Appointment with cards 12/5 Medication adjustments made at that appointment. Plan: Educated about the importance of compliance with HTN medications. Follow up with cards as scheduled Continued reinforcement of compliance with medications  Weight Loss Reinforced cardiology message to work on weight loss, and increase exercise  Magdalen Spatz, NP 07/28/2017  11:51 AM

## 2017-08-17 ENCOUNTER — Ambulatory Visit: Payer: Self-pay

## 2017-08-17 ENCOUNTER — Other Ambulatory Visit: Payer: Self-pay

## 2017-08-17 NOTE — Progress Notes (Deleted)
Patient ID: [name]                 DOB: Nov 03, 1980                      MRN: [id number]     HPI: [name] [name] is a 36 y.o. female patient of Dr. referred by Dr. Marlou Porch to HTN clinic. PMH is significant for HTN, OSA, CHF (EF 50-55%), CKD, obesity, gestational DM. She was admitted in 10/17 respiratory failure in the setting of HTN emergency with pulmonary edema and OHS/OSA requiring intubation. She ultimately underwent tracheostomy due to prolonged respiratory failure. She was admitted in 1/18 with acute renal failure in the setting of dehydration. She has been followed in our hypertension clinic to help with control of blood pressure. She has been seen in HTN clinic since April 2018. Her pressures have remained essentially unchanged since that time with the addition and titration of several medications. She was most recently seen by Truitt Merle, NP at which time her lisinopril was increased. It was also recommended that she use alerts on her phone to help with compliance.   She presents today for follow up and additional medication titration.    Current HTN meds:  Amlodipine 65m daily Furosemide 424mdaily Hydralazine 10076mID Isosorbide dinitrate 40m69mD Lisinopril 10mg10mly   Previously tried: labetalol - BP not well controlled.   BP goal: <130/80 mmHg    Family History: The patient's family history includes Birth defects in her paternal grandfather; Diabetes in her other; Hypertension in her mother.  Social History: Denies tobacco products and alcohol  Diet: Eat most of her meals prepared from home. She limits salt. No coffee or sodas. Occasional tea intake not daily. Mostly drinks water or cranberry juice.   Exercise: Walks (generally 20-30 minutes) 2-3 times per week.   Home BP readings:   Wt Readings from Last 3 Encounters:  07/28/17 (!) 398 lb 12.8 oz (180.9 kg)  07/27/17 (!) 403 lb (182.8 kg)  05/24/17 (!) 382 lb 6.4 oz (173.5 kg)   BP Readings from  Last 3 Encounters:  07/28/17 (!) 144/66  07/27/17 (!) 180/142  05/24/17 (!) 160/82   Pulse Readings from Last 3 Encounters:  07/28/17 88  07/27/17 64  05/24/17 89    Renal function: CrCl cannot be calculated (Unknown ideal weight.).  Past Medical History:  Diagnosis Date  . Chronic diastolic heart failure (HCC) [place] EF 50-55 // b. Echo 10/17: EF 50-55, mild BAE, PASP 42 // Echo 10/18: Mild concentric LVH, EF 55-60, normal wall motion, mild MAC, severe LAE, mild TR, PASP 15, trivial pericardial effusion  . CKD (chronic kidney disease) stage 4, GFR 15-29 ml/min (HCC) 10/25/2016   admitted in 2/18 with SCr of 10 in the setting of dehydration from illness, etc >> improved to 5 at DC  . DM2 (diabetes mellitus, type 2) (HCC)    A1c 7.2 in 05/2016  . Gestational diabetes mellitus in pregnancy 05/24/2013  . HTN in pregnancy, chronic 05/24/2013  . Hypertensive heart disease with CHF (congestive heart failure) (HCC) [place]  . Morbid obesity (HCC) [place] OSA (obstructive sleep apnea) 07/09/2011   s/p Trach 2/2 prolonged respiratory failure during admx for CHF in 05/2016  . Pulmonary hypertension (HCC) [place]  . Sinus tachycardia     Current Outpatient Medications on File Prior to Visit  Medication Sig Dispense Refill  .  amLODipine (NORVASC) 5 MG tablet TAKE 1 TABLET BY MOUTH EVERY DAY 30 tablet 9  . fluticasone (FLONASE) 50 MCG/ACT nasal spray Place 2 sprays into both nostrils 2 (two) times daily. 16 g 2  . furosemide (LASIX) 40 MG tablet Take 1 tablet (40 mg total) by mouth daily. 30 tablet 3  . hydrALAZINE (APRESOLINE) 100 MG tablet Take 1 tablet (100 mg total) by mouth 3 (three) times daily. 90 tablet 3  . isosorbide dinitrate (ISORDIL) 30 MG tablet TAKE 1 TABLET BY MOUTH 3 TIMES A DAY 90 tablet 11  . lisinopril (PRINIVIL,ZESTRIL) 10 MG tablet Take 1 tablet (10 mg total) by mouth daily. 90 tablet 3  . nystatin ointment (MYCOSTATIN) Apply topically 2 (two)  times daily. 30 g 0   No current facility-administered medications on file prior to visit.     Allergies  Allergen Reactions  . No Known Allergies     unknown if currently breastfeeding.   Assessment/Plan: Hypertension: BMET today - increase lisinopril to 79m daily    Thank you, KLelan Pons APatterson Hammersmith [facility]  08/17/2017 6:57 AM

## 2017-08-26 ENCOUNTER — Telehealth: Payer: Self-pay | Admitting: Nurse Practitioner

## 2017-08-26 NOTE — Telephone Encounter (Signed)
Pt returned call and scheduled for 1/10 at 3:30pm in HTN clinic.

## 2017-08-26 NOTE — Telephone Encounter (Signed)
Patient calling to r/s pharmacist appt from 08-12-17. Patient has another appt in [place] for 09-01-17 and lives an hour away. She would like to know if she can be worked in on 09-01-17.

## 2017-08-26 NOTE — Telephone Encounter (Signed)
LMOM - could squeeze pt in at 3:30PM on 1/10, otherwise schedule is full that day.

## 2017-09-01 ENCOUNTER — Ambulatory Visit (INDEPENDENT_AMBULATORY_CARE_PROVIDER_SITE_OTHER): Payer: Medicaid Other | Admitting: Pharmacist

## 2017-09-01 ENCOUNTER — Other Ambulatory Visit: Payer: Medicaid Other | Admitting: *Deleted

## 2017-09-01 ENCOUNTER — Ambulatory Visit (HOSPITAL_COMMUNITY)
Admission: RE | Admit: 2017-09-01 | Discharge: 2017-09-01 | Disposition: A | Discharge status: Home / Self Care | Payer: Medicaid Other | Source: Ambulatory Visit | Attending: Acute Care | Admitting: Acute Care

## 2017-09-01 VITALS — BP 144/104 | HR 92

## 2017-09-01 DIAGNOSIS — Z930 Tracheostomy status: Secondary | ICD-10-CM | POA: Insufficient documentation

## 2017-09-01 DIAGNOSIS — I119 Hypertensive heart disease without heart failure: Secondary | ICD-10-CM

## 2017-09-01 DIAGNOSIS — G4733 Obstructive sleep apnea (adult) (pediatric): Secondary | ICD-10-CM

## 2017-09-01 DIAGNOSIS — I10 Essential (primary) hypertension: Secondary | ICD-10-CM | POA: Diagnosis not present

## 2017-09-01 DIAGNOSIS — Z4682 Encounter for fitting and adjustment of non-vascular catheter: Secondary | ICD-10-CM | POA: Diagnosis not present

## 2017-09-01 DIAGNOSIS — Z79899 Other long term (current) drug therapy: Secondary | ICD-10-CM

## 2017-09-01 NOTE — Progress Notes (Signed)
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  She was initially diagnosed in October 2012 with an AHI at 73.  Had her tracheostomy since 2017.  She recently underwent a sleep study and was set up with home CPAP.  She proven she does not need tracheostomy for airway protection however she has had some difficulty with compliance with her CPAP mask primarily due to mask fit.  She presents to the tracheostomy clinic today for routine tracheostomy change.  Her tracheostomy is capped with a occlusive red.  She is in no distress.  ROS General: No fever, malaise, general aches.  HEENT: No headache, nasal congestion, sore throat, tracheal discharge.  Pulmonary: No shortness of breath, wheeze, cough, chest discomfort.  Cardiac: No chest pain, palpitations, orthopnea, swelling.  Abdomen: No pain, nausea vomiting diarrhea.  Musculoskeletal no focal weakness.  Endocrine within normal neuro within normal   Vital signs: Heart rate 94 blood pressure 174/96 respirations 20 pulse oximetry 91% on room air  Exam: 37 year old American female resting comfortably in bed.  She is amatory, walked into the tracheostomy clinic without difficulty HEENT, she is normocephalic atraumatic, mucous membranes are moist.  She has a #5 uncuffed Bivona tracheostomy.  This stoma site is unremarkable Pulmonary: Clear to auscultation without accessory use Cardiac: Regular rate and rhythm Abdomen: Soft nontender no organomegaly Extremities: Warm, no edema, brisk cap refill Neuro: Awake alert no focal deficits  Trach change/procedure #5 Bivona tracheostomy was changed without difficulty   Impression/dx Severe sleep apnea Tracheostomy dependence Severe hypertension  Discussion As mentioned on previous notes [place] has proven she no longer needs tracheostomy, she is  ambulating with occlusive trach without difficulty.  At this point the only thing preventing decannulation is ensuring she can be compliant with her CPAP device.  She is having a formal fitting done for this next week, the hope is that with more comfortable fitting mask she will be more apt to use her CPAP.  She understands when she does not use the CPAP she is to open the tracheostomy/remove the occlusive.  Plan Return for routine trach change in 5 weeks If can get mask fit taken care of we can remove this sooner.     Visit time: 40 minutes.   Erick Colace ACNP-BC [place]

## 2017-09-01 NOTE — Progress Notes (Signed)
Tracheostomy Procedure Note  [name] [name] [id number] Aug 21, 1981  Pre Procedure Tracheostomy Information  Trach Brand: Bivona Size: 5.0 Style: Uncuffed Secured by: Velcro   Procedure: trach change    Post Procedure Tracheostomy Information  Trach Brand: Bivona Size: 5.0 Style: Uncuffed Secured by: Velcro   Post Procedure Evaluation:  ETCO2 positive color change from yellow to purple : Yes.   Vital signs:blood pressure 174/96, pulse 94, respirations 20 and pulse oximetry 91 % Patients current condition: stable Complications: No apparent complications Trach site exam: clean, dry Wound care done: dry and 4 x 4 gauze Patient did tolerate procedure well.   Education: none  Prescription needs: none    Additional needs: none

## 2017-09-01 NOTE — Progress Notes (Signed)
Patient ID: [name] [name]                 DOB: [birth year]                      MRN: [id number]     HPI: [name] [name] is a 37 y.o. female patient referred by Dr. Marlou Porch to HTN clinic. PMH is significant for HTN, OSA, CHF (EF 50-55%), CKD, obesity, gestational DM. She was admitted in 10/17 respiratory failure in the setting of HTN emergency with pulmonary edema and OHS/OSA requiring intubation. She ultimately underwent tracheostomy due to prolonged respiratory failure. She was admitted in 1/18 with acute renal failure in the setting of dehydration. She has been followed in our hypertension clinic to help with control of blood pressure. She has been seen in HTN clinic since April 2018. Her pressures have remained essentially unchanged since that time with the addition and titration of several medications. She was most recently seen by Truitt Merle, NP at which time her lisinopril was increased. It was also recommended that she use alerts on her phone to help with compliance.   She presents today for follow up and additional medication titration. She has her daughter with her again today. She states that her blood pressure was still elevated, but better today at the trach center.   She also reports that she has made an effort to be compliant with her medications. She has not missed a dose of hydralazine this week (except today - her midday dose is due now and she will take as soon as she gets home today).    Current HTN meds:  Amlodipine 37m daily in the morning Furosemide 417mdaily Hydralazine 10024mID - she has not missed any doses this week per her report Isosorbide dinitrate 40m34mD Lisinopril 10mg34mly in the morning Carvedilol 25mg 27m Previously tried: labetalol - BP not well controlled.   BP goal: <130/80 mmHg    Family History: The patient's family history includes Birth defects in her paternal grandfather; Diabetes in her other; Hypertension in her mother.  Social  History: Denies tobacco products and alcohol  Diet: Eat most of her meals prepared from home. She limits salt. No coffee or sodas. Occasional tea intake not daily. Mostly drinks water or cranberry juice.   Exercise: Walks (generally 20-30 minutes) 2-3 times per week.   Home BP readings: she is unsure if home cuff measures appropriately due to fit.   Wt Readings from Last 3 Encounters:  07/28/17 (!) 398 lb 12.8 oz (180.9 kg)  07/27/17 (!) 403 lb (182.8 kg)  05/24/17 (!) 382 lb 6.4 oz (173.5 kg)   BP Readings from Last 3 Encounters:  09/01/17 (!) 144/104  07/28/17 (!) 144/66  07/27/17 (!) 180/142   Pulse Readings from Last 3 Encounters:  09/01/17 92  07/28/17 88  07/27/17 64    Renal function: CrCl cannot be calculated (Unknown ideal weight.).  Past Medical History:  Diagnosis Date  . Chronic diastolic heart failure (HCC)  [place] 10/12: EF 50-55 // b. Echo 10/17: EF 50-55, mild BAE, PASP 42 // Echo 10/18: Mild concentric LVH, EF 55-60, normal wall motion, mild MAC, severe LAE, mild TR, PASP 15, trivial pericardial effusion  . CKD (chronic kidney disease) stage 4, GFR 15-29 ml/min (HCC) 10/25/2016   admitted in 2/18 with SCr of 10 in the setting of dehydration from illness, etc >> improved to 5 at DC  . DM2 (  diabetes mellitus, type 2) (HCC)    A1c 7.2 in 05/2016  . Gestational diabetes mellitus in pregnancy 05/24/2013  . HTN in pregnancy, chronic 05/24/2013  . Hypertensive heart disease with CHF (congestive heart failure) ([place]) 05/25/2016  . Morbid obesity ([place])   . OSA (obstructive sleep apnea) 07/09/2011   s/p Trach 2/2 prolonged respiratory failure during admx for CHF in 05/2016  . Pulmonary hypertension ([place])    secondary from CHF/OSA  . Sinus tachycardia     Current Outpatient Medications on File Prior to Visit  Medication Sig Dispense Refill  . amLODipine (NORVASC) 5 MG tablet TAKE 1 TABLET BY MOUTH EVERY DAY 30 tablet 9  . furosemide (LASIX) 40 MG tablet Take 1  tablet (40 mg total) by mouth daily. 30 tablet 3  . hydrALAZINE (APRESOLINE) 100 MG tablet Take 1 tablet (100 mg total) by mouth 3 (three) times daily. 90 tablet 3  . isosorbide dinitrate (ISORDIL) 30 MG tablet TAKE 1 TABLET BY MOUTH 3 TIMES A DAY 90 tablet 11  . lisinopril (PRINIVIL,ZESTRIL) 10 MG tablet Take 1 tablet (10 mg total) by mouth daily. 90 tablet 3  . nystatin ointment (MYCOSTATIN) Apply topically 2 (two) times daily. 30 g 0  . fluticasone (FLONASE) 50 MCG/ACT nasal spray Place 2 sprays into both nostrils 2 (two) times daily. (Patient not taking: Reported on 09/01/2017) 16 g 2   No current facility-administered medications on file prior to visit.     Allergies  Allergen Reactions  . No Known Allergies     Blood pressure (!) 144/104, pulse 92, unknown if currently breastfeeding.   Assessment/Plan: Hypertension: BMET today stable. Will proceed to increase lisinopril to 46m daily. Follow up in HTN clinic in 2-3 weeks for repeat BMET and blood pressure check. Advised to bring home cuff for verification of accuracy.   LMOM to discuss lab results and increased dose.   Spoke with patient and advised of lab results. Will increase lisinopril to 268mdaily. Also of note at our previous visits we had her taking carvedilol 2549mID. At some point since her visit with LorRemer MachoADDENDUM: spoke with patient who reports that she IS taking carvedilol 75m63mD. Have added back to medication list.   Thank you, KellLelan PonstePatterson [facility]  09/02/2017 8:34 AM

## 2017-09-01 NOTE — Patient Instructions (Signed)
Return for a follow up appointment in 2-3 weeks  Check your blood pressure at home daily (if able) and keep record of the readings.  Take your BP meds as follows: We will plan to increase your lisinopril to 55m daily (you may take 2 tablets of your current supply, then pick up higher dose and take 1 tablet daily) pending your blood work results. We will call you to let you know.   Bring all of your meds, your BP cuff and your record of home blood pressures to your next appointment.  Exercise as you're able, try to walk approximately 30 minutes per day.  Keep salt intake to a minimum, especially watch canned and prepared boxed foods.  Eat more fresh fruits and vegetables and fewer canned items.  Avoid eating in fast food restaurants.    HOW TO TAKE YOUR BLOOD PRESSURE: . Rest 5 minutes before taking your blood pressure. .  Don't smoke or drink caffeinated beverages for at least 30 minutes before. . Take your blood pressure before (not after) you eat. . Sit comfortably with your back supported and both feet on the floor (don't cross your legs). . Elevate your arm to heart level on a table or a desk. . Use the proper sized cuff. It should fit smoothly and snugly around your bare upper arm. There should be enough room to slip a fingertip under the cuff. The bottom edge of the cuff should be 1 inch above the crease of the elbow. . Ideally, take 3 measurements at one sitting and record the average.

## 2017-09-02 LAB — BASIC METABOLIC PANEL
BUN/Creatinine Ratio: 17 (ref 9–23)
BUN: 19 mg/dL (ref 6–20)
CO2: 30 mmol/L — ABNORMAL HIGH (ref 20–29)
Calcium: 9.3 mg/dL (ref 8.7–10.2)
Chloride: 97 mmol/L (ref 96–106)
Creatinine, Ser: 1.12 mg/dL — ABNORMAL HIGH (ref 0.57–1.00)
GFR calc Af Amer: 72 mL/min/{1.73_m2} (ref >59)
GFR calc non Af Amer: 62 mL/min/{1.73_m2} (ref >59)
Glucose: 93 mg/dL (ref 65–99)
Potassium: 4.1 mmol/L (ref 3.5–5.2)
Sodium: 143 mmol/L (ref 134–144)

## 2017-09-02 MED ORDER — LISINOPRIL 20 MG PO TABS: 20.0000 mg | ORAL_TABLET | Freq: Every day | ORAL | 3 refills | Status: DC

## 2017-09-06 ENCOUNTER — Other Ambulatory Visit (HOSPITAL_BASED_OUTPATIENT_CLINIC_OR_DEPARTMENT_OTHER): Payer: Self-pay

## 2017-09-22 ENCOUNTER — Ambulatory Visit: Payer: Self-pay

## 2017-09-22 ENCOUNTER — Ambulatory Visit: Payer: Self-pay | Admitting: Acute Care

## 2017-09-22 ENCOUNTER — Other Ambulatory Visit: Payer: Self-pay | Admitting: Physician Assistant

## 2017-10-04 NOTE — Progress Notes (Deleted)
History of Present Illness [name] [name] is a 37 y.o. female with known history significant sleep apnea, and chronic tracheostomy dependence.She is followed by [name], NP in the trach clinic. She was followed by Dr. [name] in the  office for OSA. HPI: 37 year old female followed for severe obstructive sleep apneaand chronictrach since 05/2016 She was diagnosed with sleep apnea October 2012 with an AHI at 73. Hospital admission October 2017 with critical illness requiring tracheostomy.She has  proven she does not need tracheostomy from an airway standpoint, however we cannot  decannulate her until she can be successful and compliant with CPAP at night.She understands this. She states she has some anxiety about decannulation.   10/04/2017 Follow up for CPAP Compliance: Pt. Presents for follow up. She has a #5 Bivona tracheostomy which she has changed every 5 weeks by [name] in the [place] clinic. She was last seen at the reach clinic 10/05/2017 prior to this visit. She has had a mask fitting with [name] and    OSA (obstructive sleep apnea) [name] is secure and clean No stridor Plan: Cap your  trach during the day and keep it open( Uncapped )  at night with nocturnal oxygen at 2 L  when you  do not have a CPAP mask in place. Restart  CPAP at bedtime.  When you wear CPAP, remember to cap your trach. Remember [name] cannot remove your trach if we cannot be successful with CPAP use 100% of the time. We will place order for gel mask and mask fitting with [name]. You should get a call to schedule Goal is to wear for at least 6 hours each night for maximal clinical benefit. Continue to work on weight loss, as the link between excess weight  and sleep apnea is well established.  Do not drive if sleepy. Remember to clean mask, tubing, filter, and reservoir once weekly with soapy water.  Follow up with [name], NP or [name] NP  In 8 weeks or before as needed.  Follow up with [name]  at the trach clinic in 5 weeks. Please contact office for sooner follow up if symptoms do not improve or worsen or seek emergency care    HTN Current HTN meds:  Amlodipine 67m daily in the morning Furosemide 412mdaily Hydralazine 10050mID - she has not missed any doses this week per her report Isosorbide dinitrate 2m5mD Lisinopril 10mg6mly in the morning Carvedilol 25mg 28m Previously tried:labetalol - BP not well controlled.  BP goal:<130/80 mmHg  Blood Pressure 07/27/2017 at trach clinic 196/119 Blood Pressure 07/27/2017 in cards office 180/142 Blood Pressure better controlled 07/28/2017 in pulmonary  at 144/66 Appointment with cards 12/5 Medication adjustments made at that appointment. Plan: Educated about the importance of compliance with HTN medications. Follow up with cards as scheduled Continued reinforcement of compliance with medications  Weight Loss Reinforced cardiology message to work on weight loss, and increase exercise  Test Results: ONO 06/29/2017>>Study shows she needs O2 2lpm at bedtime w/ uncapped trach if she is not going to wear CPAP (with capped trach). Pt. States she prefers to wear CPAP and not use nocturnal oxygen. 11/16/2016 sleep study showed severe sleep apnea, AHI 59, optimal C Pap pressure was 12 cm of H2O (done with capped trach )  06/08/2017 Echo>> EF : 55-60%, PAP 15 mm Hg  CBC Latest Ref Rng & Units 09/20/2016 09/19/2016 09/18/2016  WBC 4.0 - 10.5 K/uL 8.4 8.7 9.1  Hemoglobin 12.0 - 15.0  g/dL 9.1(L) 9.4(L) 9.8(L)  Hematocrit 36.0 - 46.0 % 27.6(L) 28.4(L) 29.7(L)  Platelets 150 - 400 K/uL 469(H) 484(H) 469(H)    BMP Latest Ref Rng & Units 09/01/2017 07/27/2017 06/08/2017  Glucose 65 - 99 mg/dL 93 104(H) 105(H)  BUN 6 - 20 mg/dL 19 16 22(H)  Creatinine 0.57 - 1.00 mg/dL 1.12(H) 0.95 1.11(H)  BUN/Creat Ratio 9 - _0 Sodium 134 - 144 mmol/L 143 141 137  Potassium 3.5 - 5.2 mmol/L 4.1 4.4 4.5  Chloride 96 - 106 mmol/L 97  99 100  CO2 20 - 29 mmol/L 30(H) 28 25  Calcium 8.7 - 10.2 mg/dL 9.3 9.2 9.0    BNP    Component Value Date/Time   BNP 175.1 (H) 09/15/2016 1418    ProBNP No results found for: PROBNP  PFT No results found for: FEV1PRE, FEV1POST, FVCPRE, FVCPOST, TLC, DLCOUNC, PREFEV1FVCRT, PSTFEV1FVCRT  No results found.   Past medical hx Past Medical History:  Diagnosis Date  . Chronic diastolic heart failure ([place])    Echo 10/12: EF 50-55 // b. Echo 10/17: EF 50-55, mild BAE, PASP 42 // Echo 10/18: Mild concentric LVH, EF 55-60, normal wall motion, mild MAC, severe LAE, mild TR, PASP 15, trivial pericardial effusion  . CKD (chronic kidney disease) stage 4, GFR 15-29 ml/min (HCC) 10/25/2016   admitted in 2/18 with SCr of 10 in the setting of dehydration from illness, etc >> improved to 5 at DC  . DM2 (diabetes mellitus, type 2) (HCC)    A1c 7.2 in 05/2016  . Gestational diabetes mellitus in pregnancy 05/24/2013  . HTN in pregnancy, chronic 05/24/2013  . Hypertensive heart disease with CHF (congestive heart failure) ([place]) 05/25/2016  . Morbid obesity ([place])   . OSA (obstructive sleep apnea) 07/09/2011   s/p Trach 2/2 prolonged respiratory failure during admx for CHF in 05/2016  . Pulmonary hypertension ([place])    secondary from CHF/OSA  . Sinus tachycardia      Social History   Tobacco Use  . Smoking status: Never Smoker  . Smokeless tobacco: Never Used  Substance Use Topics  . Alcohol use: No  . Drug use: No    Ms.Monger reports that  has never smoked. she has never used smokeless tobacco. She reports that she does not drink alcohol or use drugs.  Tobacco Cessation: Counseling given: Not Answered   Past surgical hx, Family hx, Social hx all reviewed.  Current Outpatient Medications on File Prior to Visit  Medication Sig  . amLODipine (NORVASC) 5 MG tablet TAKE 1 TABLET BY MOUTH EVERY DAY  . carvedilol (COREG) 25 MG tablet Take 25 mg by mouth 2 (two) times daily with a meal.  .  fluticasone (FLONASE) 50 MCG/ACT nasal spray Place 2 sprays into both nostrils 2 (two) times daily. (Patient not taking: Reported on 09/01/2017)  . furosemide (LASIX) 40 MG tablet TAKE 1 TABLET BY MOUTH EVERY DAY  . hydrALAZINE (APRESOLINE) 100 MG tablet Take 1 tablet (100 mg total) by mouth 3 (three) times daily.  . isosorbide dinitrate (ISORDIL) 30 MG tablet TAKE 1 TABLET BY MOUTH 3 TIMES A DAY  . lisinopril (PRINIVIL,ZESTRIL) 20 MG tablet Take 1 tablet (20 mg total) by mouth daily.  Marland Kitchen nystatin ointment (MYCOSTATIN) Apply topically 2 (two) times daily.   No current facility-administered medications on file prior to visit.      Allergies  Allergen Reactions  . No Known Allergies     Review Of Systems:  Constitutional:  No  weight loss, night sweats,  Fevers, chills, fatigue, or  lassitude.  HEENT:   No headaches,  Difficulty swallowing,  Tooth/dental problems, or  Sore throat,                No sneezing, itching, ear ache, nasal congestion, post nasal drip,   CV:  No chest pain,  Orthopnea, PND, swelling in lower extremities, anasarca, dizziness, palpitations, syncope.   GI  No heartburn, indigestion, abdominal pain, nausea, vomiting, diarrhea, change in bowel habits, loss of appetite, bloody stools.   Resp: No shortness of breath with exertion or at rest.  No excess mucus, no productive cough,  No non-productive cough,  No coughing up of blood.  No change in color of mucus.  No wheezing.  No chest wall deformity  Skin: no rash or lesions.  GU: no dysuria, change in color of urine, no urgency or frequency.  No flank pain, no hematuria   MS:  No joint pain or swelling.  No decreased range of motion.  No back pain.  Psych:  No change in mood or affect. No depression or anxiety.  No memory loss.   Vital Signs There were no vitals taken for this visit.   Physical Exam:  General- No distress,  A&Ox3 ENT: No sinus tenderness, TM clear, pale nasal mucosa, no oral exudate,no post  nasal drip, no LAN Cardiac: S1, S2, regular rate and rhythm, no murmur Chest: No wheeze/ rales/ dullness; no accessory muscle use, no nasal flaring, no sternal retractions Abd.: Soft Non-tender Ext: No clubbing cyanosis, edema Neuro:  normal strength Skin: No rashes, warm and dry Psych: normal mood and behavior   Assessment/Plan  No problem-specific Assessment & Plan notes found for this encounter.    Magdalen Spatz, NP 10/04/2017  4:43 PM

## 2017-10-05 ENCOUNTER — Ambulatory Visit: Payer: Self-pay | Admitting: Acute Care

## 2017-10-05 ENCOUNTER — Ambulatory Visit (HOSPITAL_COMMUNITY)
Admission: RE | Admit: 2017-10-05 | Discharge: 2017-10-05 | Disposition: A | Discharge status: Home / Self Care | Payer: Medicaid Other | Source: Ambulatory Visit | Attending: Acute Care | Admitting: Acute Care

## 2017-10-05 DIAGNOSIS — G4733 Obstructive sleep apnea (adult) (pediatric): Secondary | ICD-10-CM

## 2017-10-05 DIAGNOSIS — Z4589 Encounter for adjustment and management of other implanted devices: Secondary | ICD-10-CM | POA: Diagnosis present

## 2017-10-05 DIAGNOSIS — Z930 Tracheostomy status: Secondary | ICD-10-CM

## 2017-10-05 NOTE — Progress Notes (Signed)
Tracheostomy Procedure Note  [name] [name] [id number] 1981-07-04  Pre Procedure Tracheostomy Information  Trach Brand: Bivona  Size: 5.0 Style: Uncuffed Secured by: Velcro   Procedure: trach change    Post Procedure Tracheostomy Information  Trach Brand: Bivona Size: 5.0 Style: Uncuffed Secured by: Velcro   Post Procedure Evaluation:  ETCO2 positive color change from yellow to purple : Yes.   Vital signs:blood pressure 189/101, pulse 88, respirations 15 and pulse oximetry 95 % Patients current condition: stable Complications: No apparent complications Trach site exam: clean, dry Wound care done: dry and 4 x 4 gauze Patient did tolerate procedure well.   Education: none  Prescription needs: none    Additional needs: 4-5 week follow up

## 2017-10-05 NOTE — Progress Notes (Signed)
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  We have been slowly working on transitioning to CPAP in hopes of getting the tracheostomy removed however she has had a lot of anxiety associated with the CPAP mask, and this results in relatively poor adherence to her CPAP regimen.  Because of this have been reluctant to decannulate her  ROS General: 37 year old female currently in no acute distress.  She ambulated into the office without difficulty HEENT: Normocephalic atraumatic.  Tracheostomy stoma is unremarkable, no complaint of headache, nasal discharge, nasal congestion, sore throat, tracheal drainage.  Pulmonary: Denies shortness of breath, chest pain, or cough or wheezing.  Cardiac: Denies chest pain, palpitations, lower extremity edema.  Abdomen: Denies nausea vomiting diarrhea loss of appetite or weight gain or loss.  GU: Within normal neuro/psych: Within normal endocrine: Within normal  Vital signs: Heart rate 88, respirations 15, pulse oximetry 95% on room air blood pressure is 189/101 (she does have a follow-up appointment once again with cardiology in the next 24 hours)  Exam: General: 37 year old African-American female currently in no acute distress.  HEENT: Normocephalic atraumatic.  She has a #5 uncuffed Bivona tracheostomy in place, the tracheostomy stoma is unremarkable.  She has a red capping device in place.  That is as well as unremarkable.  Her phonation quality is excellent.  Pulmonary: Clear to auscultation without accessory use cardiac: Regular rate and rhythm abdomen: Soft nontender no organomegaly extremities/musculoskeletal: Equal strength and bulk no significant edema neuro/psych: Awake oriented no focal deficits  Trach change/procedure #5 Bivona tracheostomy was changed without difficulty End-tidal CO2  checked and verified   Impression/dx Severe osa Trach dependence   Discussion No new recommendations.  Rena has proven she no longer needs a tracheostomy in regards to maintaining her airway, however her sleep apnea has not gone away, If she cannot be compliant with CPAP device regularly then decannulation would be a bad idea in this case as her sleep apnea would no longer be treated.  I think this is paramount as she also has underlying heart function, and difficult to control hypertension, so untreated sleep apnea respiratory failure would add significant stranger cardiovascular function and significantly increase her risk of acute illness  Plan Continue capping trials Continue to encourage CPAP, no plan for decannulation until it is clear she can be 100% compliant with CPAP device return visit 4-5 weeks    Visit time: 35 minutes.   Erick Colace ACNP-BC [place]

## 2017-10-06 ENCOUNTER — Encounter: Payer: Self-pay | Admitting: Pharmacist

## 2017-10-06 ENCOUNTER — Ambulatory Visit (INDEPENDENT_AMBULATORY_CARE_PROVIDER_SITE_OTHER): Payer: Medicaid Other | Admitting: Pharmacist

## 2017-10-06 VITALS — BP 152/86 | HR 81

## 2017-10-06 DIAGNOSIS — I10 Essential (primary) hypertension: Secondary | ICD-10-CM

## 2017-10-06 MED ORDER — LISINOPRIL 40 MG PO TABS: 40.0000 mg | ORAL_TABLET | Freq: Every day | ORAL | 3 refills | Status: DC

## 2017-10-06 NOTE — Patient Instructions (Addendum)
Return for a follow up appointment in 3-4 weeks  Check your blood pressure at home daily (if able) and keep record of the readings.  Take your BP meds as follows: INCREASE lisinopril to 25m daily (take 2 tablets of current supply until you run out then start higher strength 1 tablet daily)  Bring all of your meds, your BP cuff and your record of home blood pressures to your next appointment.  Exercise as you're able, try to walk approximately 30 minutes per day.  Keep salt intake to a minimum, especially watch canned and prepared boxed foods.  Eat more fresh fruits and vegetables and fewer canned items.  Avoid eating in fast food restaurants.    HOW TO TAKE YOUR BLOOD PRESSURE: . Rest 5 minutes before taking your blood pressure. .  Don't smoke or drink caffeinated beverages for at least 30 minutes before. . Take your blood pressure before (not after) you eat. . Sit comfortably with your back supported and both feet on the floor (don't cross your legs). . Elevate your arm to heart level on a table or a desk. . Use the proper sized cuff. It should fit smoothly and snugly around your bare upper arm. There should be enough room to slip a fingertip under the cuff. The bottom edge of the cuff should be 1 inch above the crease of the elbow. . Ideally, take 3 measurements at one sitting and record the average.

## 2017-10-06 NOTE — Progress Notes (Signed)
Patient ID: [name] [name]                 DOB: [birth year]                      MRN: [id number]     HPI: [name] [name] is a 37 y.o. female patient referred by Dr. Marlou Porch to HTN clinic. PMH is significant for HTN, OSA, CHF (EF 50-55%), CKD, obesity, gestational DM. She was admitted in 10/17 respiratory failure in the setting of HTN emergency with pulmonary edema and OHS/OSA requiring intubation. She ultimately underwent tracheostomy due to prolonged respiratory failure. She was admitted in 1/18 with acute renal failure in the setting of dehydration. She has been followed in our hypertension clinic to help with control of blood pressure. She has been seen in HTN clinic since April 2018. Her pressures have remained essentially unchanged since that time with the addition and titration of several medications. At her last visit compliance was reiterated and her lisinopril was increased to 35m daily.   She presents today for follow up and additional medication titration. She has her daughter with her again today. She denies chest pain, SOB, and dizziness. She forgot to bring her cuff today for verification.    1163systolic at trach center yesterday. She believes that they are using a cuff that is too small. We have been using thigh cuff here given the size of her arm. Also of note it can be difficult to hear pressures due to the size of her arm as well.   Current HTN meds:  Amlodipine 551mdaily in the morning Furosemide 4065maily Hydralazine 100m59mD (10am, 3pm, 11pm) - may have missed midday dose last week once Isosorbide dinitrate 30mg47m - may have missed midday dose last week once Lisinopril 20mg 94my in the morning  Carvedilol 25mg B68mPreviously tried: labetalol - BP not well controlled.   BP goal: <130/80 mmHg    Family History: The patient's family history includes Birth defects in her paternal grandfather; Diabetes in her other; Hypertension in her mother.  Social History:  Denies tobacco products and alcohol  Diet: Eat most of her meals prepared from home. She limits salt. No coffee or sodas. Occasional tea intake not daily. Mostly drinks water or cranberry juice.   Exercise: Walks (generally 20-30 minutes) 2-3 times per week.   Home BP readings: she is unsure if home cuff measures appropriately due to fit.   Wt Readings from Last 3 Encounters:  07/28/17 (!) 398 lb 12.8 oz (180.9 kg)  07/27/17 (!) 403 lb (182.8 kg)  05/24/17 (!) 382 lb 6.4 oz (173.5 kg)   BP Readings from Last 3 Encounters:  10/06/17 (!) 152/86  09/01/17 (!) 144/104  07/28/17 (!) 144/66   Pulse Readings from Last 3 Encounters:  10/06/17 81  09/01/17 92  07/28/17 88    Renal function: CrCl cannot be calculated (Patient's most recent lab result is older than the maximum 21 days allowed.).  Past Medical History:  Diagnosis Date  . Chronic diastolic heart failure (HCC)   [place] 10/12: EF 50-55 // b. Echo 10/17: EF 50-55, mild BAE, PASP 42 // Echo 10/18: Mild concentric LVH, EF 55-60, normal wall motion, mild MAC, severe LAE, mild TR, PASP 15, trivial pericardial effusion  . CKD (chronic kidney disease) stage 4, GFR 15-29 ml/min (HCC) 10/25/2016   admitted in 2/18 with SCr of 10 in the setting of dehydration from illness, etc >>  improved to 5 at DC  . DM2 (diabetes mellitus, type 2) (HCC)    A1c 7.2 in 05/2016  . Gestational diabetes mellitus in pregnancy 05/24/2013  . HTN in pregnancy, chronic 05/24/2013  . Hypertensive heart disease with CHF (congestive heart failure) ([place]) 05/25/2016  . Morbid obesity ([place])   . OSA (obstructive sleep apnea) 07/09/2011   s/p Trach 2/2 prolonged respiratory failure during admx for CHF in 05/2016  . Pulmonary hypertension ([place])    secondary from CHF/OSA  . Sinus tachycardia     Current Outpatient Medications on File Prior to Visit  Medication Sig Dispense Refill  . amLODipine (NORVASC) 5 MG tablet TAKE 1 TABLET BY MOUTH EVERY DAY 30 tablet 9    . carvedilol (COREG) 25 MG tablet Take 25 mg by mouth 2 (two) times daily with a meal.    . fluticasone (FLONASE) 50 MCG/ACT nasal spray Place 2 sprays into both nostrils 2 (two) times daily. 16 g 2  . furosemide (LASIX) 40 MG tablet TAKE 1 TABLET BY MOUTH EVERY DAY 30 tablet 3  . hydrALAZINE (APRESOLINE) 100 MG tablet Take 1 tablet (100 mg total) by mouth 3 (three) times daily. 90 tablet 3  . isosorbide dinitrate (ISORDIL) 30 MG tablet TAKE 1 TABLET BY MOUTH 3 TIMES A DAY 90 tablet 11  . nystatin ointment (MYCOSTATIN) Apply topically 2 (two) times daily. 30 g 0   No current facility-administered medications on file prior to visit.     Allergies  Allergen Reactions  . No Known Allergies     Blood pressure (!) 152/86, pulse 81, unknown if currently breastfeeding. Pt is not currently breast feeding and is not planning to become pregnant  Assessment/Plan: Hypertension: BP today is not controlled. Increase lisinopril to 58m daily. We did adjust her morning and midday dose of hydralazine since she has been missing midday dose of hydralazine frequently. Hopefully this will help with compliance. Repeat BMET at follow up in 3 weeks.   Thank you, KLelan Pons APatterson Hammers[facility]  10/06/2017 10:45 AM

## 2017-10-24 ENCOUNTER — Other Ambulatory Visit: Payer: Self-pay | Admitting: Pharmacist

## 2017-10-24 DIAGNOSIS — I10 Essential (primary) hypertension: Secondary | ICD-10-CM

## 2017-10-24 MED ORDER — HYDRALAZINE HCL 100 MG PO TABS
100.0000 mg | ORAL_TABLET | Freq: Three times a day (TID) | ORAL | 3 refills | Status: DC
Start: 2017-10-24 — End: 2018-03-06

## 2017-10-25 NOTE — Progress Notes (Deleted)
History of Present Illness [name] [name] is a 37 y.o. female never smoker with known history significant sleep apnea, and chronic tracheostomy dependence.She is followed by [name], NP in the trach clinic. She was followed by Dr. [name] in the  office for OSA. We will reassign her to Dr. Halford Chessman.  HPI: 37 year old female followed for obstructive sleep apneaand chronictrach since 05/2016 She was diagnosed with sleep apnea October 2012 with an AHI at 73. Hospital admission October 2017 with critical illness requiring tracheostomy. She has proven she does not need tracheostomy for airway protection however,  she has had  difficulty with compliance with her CPAP mask primarily due to mask fit/ intolerence.  She understands she will not be de cannulated until she is 100% compliant with CPAP therapy at night. CPAP is paramount as she also has underlying heart function, and difficult to control hypertension, so untreated sleep apnea / respiratory failure would add significant strain to her  cardiovascular function and significantly increase her risk of acute illness.  #5 Bivona uncuffed  tracheostomy ( 05/2016) Last Trach change 10/05/2017 Next visit trach clinic: 11/02/2017  10/25/2017 Follow up OV: Pt. Presents for follow up of use of CPAP for OSA: Pt. Resents for follow up. She states she has been doing well.   Test Results: ONO 06/29/2017>>Study shows she needs O2 2lpm at bedtime w/ uncapped trach if she is not going to wear CPAP (with capped trach). Pt. States she prefers to wear CPAP and not use nocturnal oxygen. 11/16/2016 sleep study showed severe sleep apnea, AHI 59, optimal C Pap pressure was 12 cm of H2O (done with capped trach )  06/08/2017 Echo>> EF : 55-60%, PAP 15 mm Hg  CBC Latest Ref Rng & Units 09/20/2016 09/19/2016 09/18/2016  WBC 4.0 - 10.5 K/uL 8.4 8.7 9.1  Hemoglobin 12.0 - 15.0 g/dL 9.1(L) 9.4(L) 9.8(L)  Hematocrit 36.0 - 46.0 % 27.6(L) 28.4(L) 29.7(L)  Platelets 150 -  400 K/uL 469(H) 484(H) 469(H)    BMP Latest Ref Rng & Units 09/01/2017 07/27/2017 06/08/2017  Glucose 65 - 99 mg/dL 93 104(H) 105(H)  BUN 6 - 20 mg/dL 19 16 22(H)  Creatinine 0.57 - 1.00 mg/dL 1.12(H) 0.95 1.11(H)  BUN/Creat Ratio 9 - _0 Sodium 134 - 144 mmol/L 143 141 137  Potassium 3.5 - 5.2 mmol/L 4.1 4.4 4.5  Chloride 96 - 106 mmol/L 97 99 100  CO2 20 - 29 mmol/L 30(H) 28 25  Calcium 8.7 - 10.2 mg/dL 9.3 9.2 9.0    BNP    Component Value Date/Time   BNP 175.1 (H) 09/15/2016 1418    ProBNP No results found for: PROBNP  PFT No results found for: FEV1PRE, FEV1POST, FVCPRE, FVCPOST, TLC, DLCOUNC, PREFEV1FVCRT, PSTFEV1FVCRT  No results found.   Past medical hx Past Medical History:  Diagnosis Date  . Chronic diastolic heart failure ([place])    Echo 10/12: EF 50-55 // b. Echo 10/17: EF 50-55, mild BAE, PASP 42 // Echo 10/18: Mild concentric LVH, EF 55-60, normal wall motion, mild MAC, severe LAE, mild TR, PASP 15, trivial pericardial effusion  . CKD (chronic kidney disease) stage 4, GFR 15-29 ml/min (HCC) 10/25/2016   admitted in 2/18 with SCr of 10 in the setting of dehydration from illness, etc >> improved to 5 at DC  . DM2 (diabetes mellitus, type 2) (HCC)    A1c 7.2 in 05/2016  . Gestational diabetes mellitus in pregnancy 05/24/2013  . HTN in pregnancy, chronic  05/24/2013  . Hypertensive heart disease with CHF (congestive heart failure) ([place]) 05/25/2016  . Morbid obesity ([place])   . OSA (obstructive sleep apnea) 07/09/2011   s/p Trach 2/2 prolonged respiratory failure during admx for CHF in 05/2016  . Pulmonary hypertension ([place])    secondary from CHF/OSA  . Sinus tachycardia      Social History   Tobacco Use  . Smoking status: Never Smoker  . Smokeless tobacco: Never Used  Substance Use Topics  . Alcohol use: No  . Drug use: No    [name] [name] reports that  has never smoked. she has never used smokeless tobacco. She reports that she does not drink alcohol or  use drugs.  Tobacco Cessation: Never smoker  Past surgical hx, Family hx, Social hx all reviewed.  Current Outpatient Medications on File Prior to Visit  Medication Sig  . amLODipine (NORVASC) 5 MG tablet TAKE 1 TABLET BY MOUTH EVERY DAY  . carvedilol (COREG) 25 MG tablet Take 25 mg by mouth 2 (two) times daily with a meal.  . fluticasone (FLONASE) 50 MCG/ACT nasal spray Place 2 sprays into both nostrils 2 (two) times daily.  . furosemide (LASIX) 40 MG tablet TAKE 1 TABLET BY MOUTH EVERY DAY  . hydrALAZINE (APRESOLINE) 100 MG tablet Take 1 tablet (100 mg total) by mouth 3 (three) times daily.  . isosorbide dinitrate (ISORDIL) 30 MG tablet TAKE 1 TABLET BY MOUTH 3 TIMES A DAY  . lisinopril (PRINIVIL,ZESTRIL) 40 MG tablet Take 1 tablet (40 mg total) by mouth daily.  Marland Kitchen nystatin ointment (MYCOSTATIN) Apply topically 2 (two) times daily.   No current facility-administered medications on file prior to visit.      Allergies  Allergen Reactions  . No Known Allergies     Review Of Systems:  Constitutional:   No  weight loss, night sweats,  Fevers, chills, fatigue, or  lassitude.  HEENT:   No headaches,  Difficulty swallowing,  Tooth/dental problems, or  Sore throat,                No sneezing, itching, ear ache, nasal congestion, post nasal drip,   CV:  No chest pain,  Orthopnea, PND, swelling in lower extremities, anasarca, dizziness, palpitations, syncope.   GI  No heartburn, indigestion, abdominal pain, nausea, vomiting, diarrhea, change in bowel habits, loss of appetite, bloody stools.   Resp: No shortness of breath with exertion or at rest.  No excess mucus, no productive cough,  No non-productive cough,  No coughing up of blood.  No change in color of mucus.  No wheezing.  No chest wall deformity  Skin: no rash or lesions.  GU: no dysuria, change in color of urine, no urgency or frequency.  No flank pain, no hematuria   MS:  No joint pain or swelling.  No decreased range of  motion.  No back pain.  Psych:  No change in mood or affect. No depression or anxiety.  No memory loss.   Vital Signs There were no vitals taken for this visit.   Physical Exam:  General- No distress,  A&Ox3 ENT: No sinus tenderness, TM clear, pale nasal mucosa, no oral exudate,no post nasal drip, no LAN Cardiac: S1, S2, regular rate and rhythm, no murmur Chest: No wheeze/ rales/ dullness; no accessory muscle use, no nasal flaring, no sternal retractions Abd.: Soft Non-tender Ext: No clubbing cyanosis, edema Neuro:  normal strength Skin: No rashes, warm and dry Psych: normal mood and behavior   Assessment/Plan  No problem-specific Assessment & Plan notes found for this encounter.    Magdalen Spatz, NP 10/25/2017  2:33 PM

## 2017-10-26 ENCOUNTER — Ambulatory Visit: Payer: Self-pay | Admitting: Acute Care

## 2017-10-26 ENCOUNTER — Ambulatory Visit: Payer: Self-pay

## 2017-10-26 ENCOUNTER — Other Ambulatory Visit: Payer: Self-pay

## 2017-10-26 NOTE — Progress Notes (Deleted)
Patient ID: [name] [name]                 DOB: [birth year]                      MRN: [id number]     HPI: [name] [name] is a 37 y.o. female patient referred by Dr. Marlou Porch to HTN clinic. PMH is significant for HTN, OSA, CHF (EF 50-55%), CKD, obesity, gestational DM. She was admitted in 10/17 respiratory failure in the setting of HTN emergency with pulmonary edema and OHS/OSA requiring intubation. She ultimately underwent tracheostomy due to prolonged respiratory failure. She was admitted in 1/18 with acute renal failure in the setting of dehydration. She has been followed in our hypertension clinic to help with control of blood pressure. She has been seen in HTN clinic since April 2018. Her pressures have remained essentially unchanged since that time with the addition and titration of several medications. At her last visit compliance was reiterated and her lisinopril was increased to 36m daily.   She presents today for follow up and additional medication titration.    Current HTN meds:  Amlodipine 563mdaily in the morning Furosemide 4065maily Hydralazine 100m21mD  Isosorbide dinitrate 30mg50m Lisinopril 40mg 66my in the morning  Carvedilol 25mg B94mPreviously tried: labetalol - BP not well controlled.   BP goal: <130/80 mmHg    Family History: The patient's family history includes Birth defects in her paternal grandfather; Diabetes in her other; Hypertension in her mother.  Social History: Denies tobacco products and alcohol  Diet: Eat most of her meals prepared from home. She limits salt. No coffee or sodas. Occasional tea intake not daily. Mostly drinks water or cranberry juice.   Exercise: Walks (generally 20-30 minutes) 2-3 times per week.   Home BP readings: she is unsure if home cuff measures appropriately due to fit.   Wt Readings from Last 3 Encounters:  07/28/17 (!) 398 lb 12.8 oz (180.9 kg)  07/27/17 (!) 403 lb (182.8 kg)  05/24/17 (!) 382 lb 6.4 oz (173.5  kg)   BP Readings from Last 3 Encounters:  10/06/17 (!) 152/86  09/01/17 (!) 144/104  07/28/17 (!) 144/66   Pulse Readings from Last 3 Encounters:  10/06/17 81  09/01/17 92  07/28/17 88    Renal function: CrCl cannot be calculated (Patient's most recent lab result is older than the maximum 21 days allowed.).  Past Medical History:  Diagnosis Date  . Chronic diastolic heart failure (HCC)   [place] 10/12: EF 50-55 // b. Echo 10/17: EF 50-55, mild BAE, PASP 42 // Echo 10/18: Mild concentric LVH, EF 55-60, normal wall motion, mild MAC, severe LAE, mild TR, PASP 15, trivial pericardial effusion  . CKD (chronic kidney disease) stage 4, GFR 15-29 ml/min (HCC) 10/25/2016   admitted in 2/18 with SCr of 10 in the setting of dehydration from illness, etc >> improved to 5 at DC  . DM2 (diabetes mellitus, type 2) (HCC)    A1c 7.2 in 05/2016  . Gestational diabetes mellitus in pregnancy 05/24/2013  . HTN in pregnancy, chronic 05/24/2013  . Hypertensive heart disease with CHF (congestive heart failure) (HCC) [id number]  . Morbid obesity (HCC)   [place] 07/09/[place]   s/p Trach 2/2 prolonged respiratory failure during admx for CHF in 05/2016  . Pulmonary hypertension (HCC)   [place]  . Sinus tachycardia     Current Outpatient  Medications on File Prior to Visit  Medication Sig Dispense Refill  . amLODipine (NORVASC) 5 MG tablet TAKE 1 TABLET BY MOUTH EVERY DAY 30 tablet 9  . carvedilol (COREG) 25 MG tablet Take 25 mg by mouth 2 (two) times daily with a meal.    . fluticasone (FLONASE) 50 MCG/ACT nasal spray Place 2 sprays into both nostrils 2 (two) times daily. 16 g 2  . furosemide (LASIX) 40 MG tablet TAKE 1 TABLET BY MOUTH EVERY DAY 30 tablet 3  . hydrALAZINE (APRESOLINE) 100 MG tablet Take 1 tablet (100 mg total) by mouth 3 (three) times daily. 90 tablet 3  . isosorbide dinitrate (ISORDIL) 30 MG tablet TAKE 1 TABLET BY MOUTH 3 TIMES A DAY 90 tablet 11  .  lisinopril (PRINIVIL,ZESTRIL) 40 MG tablet Take 1 tablet (40 mg total) by mouth daily. 90 tablet 3  . nystatin ointment (MYCOSTATIN) Apply topically 2 (two) times daily. 30 g 0   No current facility-administered medications on file prior to visit.     Allergies  Allergen Reactions  . No Known Allergies     unknown if currently breastfeeding. Pt is not currently breast feeding and is not planning to become pregnant  Assessment/Plan: Hypertension: BP    Thank you, Lelan Pons. Patterson Hammersmith, [facility]  10/26/2017 7:59 AM

## 2017-11-02 ENCOUNTER — Ambulatory Visit (HOSPITAL_COMMUNITY): Payer: Self-pay

## 2017-11-09 ENCOUNTER — Inpatient Hospital Stay (HOSPITAL_COMMUNITY): Admission: RE | Admit: 2017-11-09 | Payer: Self-pay | Source: Ambulatory Visit

## 2017-11-09 NOTE — Progress Notes (Signed)
Patient ID: [name] [name]                 DOB: October 21, 1980                      MRN: [id number]     HPI: [name] [name] is a 37 y.o. female patient referred by Dr. Marlou Porch to HTN clinic. PMH is significant for HTN, OSA, CHF (EF 50-55%), CKD, obesity, gestational DM. She was admitted in 10/17 respiratory failure in the setting of HTN emergency with pulmonary edema and OHS/OSA requiring intubation. She ultimately underwent tracheostomy due to prolonged respiratory failure. She was admitted in 1/18 with acute renal failure in the setting of dehydration. She has been followed in our hypertension clinic to help with control of blood pressure. She has been seen in HTN clinic since April 2018. Her pressures have remained essentially unchanged since that time with the addition and titration of several medications. At her last visit compliance was reiterated and her lisinopril was increased to 69m daily.   She presents today for follow up and additional medication titration. She denies SOB, chest pain, head aches, and dizziness. May have missed one dose of hydralazine/isosorbide since last visit.   Current HTN meds:  Amlodipine 559mdaily in the morning Furosemide 4033maily Hydralazine 100m54mD  Isosorbide dinitrate 30mg22m Lisinopril 40mg 45my in the morning  Carvedilol 25mg B36mPreviously tried: labetalol - BP not well controlled.   BP goal: <130/80 mmHg    Family History: The patient's family history includes Birth defects in her paternal grandfather; Diabetes in her other; Hypertension in her mother.  Social History: Denies tobacco products and alcohol  Diet: Eat most of her meals prepared from home. She limits salt. No coffee or sodas. Occasional tea intake not daily. Mostly drinks water or cranberry juice.   Exercise: Walks (generally 20-30 minutes) 2-3 times per week.   Home BP readings: she is unsure if home cuff measures appropriately due to fit.   Wt Readings from Last 3  Encounters:  07/28/17 (!) 398 lb 12.8 oz (180.9 kg)  07/27/17 (!) 403 lb (182.8 kg)  05/24/17 (!) 382 lb 6.4 oz (173.5 kg)   BP Readings from Last 3 Encounters:  11/10/17 (!) 144/86  10/06/17 (!) 152/86  09/01/17 (!) 144/104   Pulse Readings from Last 3 Encounters:  11/10/17 87  10/06/17 81  09/01/17 92    Renal function: CrCl cannot be calculated (Patient's most recent lab result is older than the maximum 21 days allowed.).  Past Medical History:  Diagnosis Date  . Chronic diastolic heart failure (HCC)   [place] 10/12: EF 50-55 // b. Echo 10/17: EF 50-55, mild BAE, PASP 42 // Echo 10/18: Mild concentric LVH, EF 55-60, normal wall motion, mild MAC, severe LAE, mild TR, PASP 15, trivial pericardial effusion  . CKD (chronic kidney disease) stage 4, GFR 15-29 ml/min (HCC) 10/25/2016   admitted in 2/18 with SCr of 10 in the setting of dehydration from illness, etc >> improved to 5 at DC  . DM2 (diabetes mellitus, type 2) (HCC)    A1c 7.2 in 05/2016  . Gestational diabetes mellitus in pregnancy 05/24/2013  . HTN in pregnancy, chronic 05/24/2013  . Hypertensive heart disease with CHF (congestive heart failure) (HCC) [id number]  . Morbid obesity (HCC)   [place] (obstructive sleep apnea) 07/09/2011   s/p Trach 2/2 prolonged respiratory failure during admx for CHF in 05/2016  . Pulmonary  hypertension ([place])    secondary from CHF/OSA  . Sinus tachycardia     Current Outpatient Medications on File Prior to Visit  Medication Sig Dispense Refill  . carvedilol (COREG) 25 MG tablet Take 25 mg by mouth 2 (two) times daily with a meal.    . fluticasone (FLONASE) 50 MCG/ACT nasal spray Place 2 sprays into both nostrils 2 (two) times daily. 16 g 2  . furosemide (LASIX) 40 MG tablet TAKE 1 TABLET BY MOUTH EVERY DAY 30 tablet 3  . hydrALAZINE (APRESOLINE) 100 MG tablet Take 1 tablet (100 mg total) by mouth 3 (three) times daily. 90 tablet 3  . isosorbide dinitrate (ISORDIL) 30 MG tablet TAKE 1 TABLET  BY MOUTH 3 TIMES A DAY 90 tablet 11  . lisinopril (PRINIVIL,ZESTRIL) 40 MG tablet Take 1 tablet (40 mg total) by mouth daily. 90 tablet 3  . nystatin ointment (MYCOSTATIN) Apply topically 2 (two) times daily. 30 g 0   No current facility-administered medications on file prior to visit.     Allergies  Allergen Reactions  . No Known Allergies     Blood pressure (!) 144/86, pulse 87, unknown if currently breastfeeding. Pt is not currently breast feeding and is not planning to become pregnant  Assessment/Plan: Hypertension: BP is still elevated above goal today. BMET today after dose increase of lisinopril. Will increase amlodipine to 41m daily. Follow up in HTN clinic in 4-6 weeks.    Thank you, KLelan Pons APatterson [facility]  11/10/2017 9:19 AM

## 2017-11-10 ENCOUNTER — Other Ambulatory Visit: Payer: Medicaid Other | Admitting: *Deleted

## 2017-11-10 ENCOUNTER — Ambulatory Visit (HOSPITAL_COMMUNITY): Payer: Self-pay

## 2017-11-10 ENCOUNTER — Encounter: Payer: Self-pay | Admitting: Pharmacist

## 2017-11-10 ENCOUNTER — Ambulatory Visit (INDEPENDENT_AMBULATORY_CARE_PROVIDER_SITE_OTHER): Payer: Medicaid Other | Admitting: Pharmacist

## 2017-11-10 ENCOUNTER — Ambulatory Visit: Payer: Medicaid Other | Admitting: Acute Care

## 2017-11-10 ENCOUNTER — Encounter: Payer: Self-pay | Admitting: Acute Care

## 2017-11-10 VITALS — BP 144/86 | HR 87

## 2017-11-10 DIAGNOSIS — I10 Essential (primary) hypertension: Secondary | ICD-10-CM

## 2017-11-10 DIAGNOSIS — I110 Hypertensive heart disease with heart failure: Secondary | ICD-10-CM

## 2017-11-10 DIAGNOSIS — G4733 Obstructive sleep apnea (adult) (pediatric): Secondary | ICD-10-CM | POA: Diagnosis not present

## 2017-11-10 LAB — BASIC METABOLIC PANEL
BUN/Creatinine Ratio: 17 (ref 9–23)
BUN: 22 mg/dL — ABNORMAL HIGH (ref 6–20)
CO2: 29 mmol/L (ref 20–29)
Calcium: 9.2 mg/dL (ref 8.7–10.2)
Chloride: 98 mmol/L (ref 96–106)
Creatinine, Ser: 1.28 mg/dL — ABNORMAL HIGH (ref 0.57–1.00)
GFR calc Af Amer: 61 mL/min/{1.73_m2} (ref >59)
GFR calc non Af Amer: 53 mL/min/{1.73_m2} — ABNORMAL LOW (ref >59)
Glucose: 104 mg/dL — ABNORMAL HIGH (ref 65–99)
Potassium: 4.4 mmol/L (ref 3.5–5.2)
Sodium: 140 mmol/L (ref 134–144)

## 2017-11-10 MED ORDER — AMLODIPINE BESYLATE 10 MG PO TABS: 10.0000 mg | ORAL_TABLET | Freq: Every day | ORAL | 1 refills | Status: DC

## 2017-11-10 NOTE — Patient Instructions (Signed)
INCREASE to amlodipine 23m daily (you may take 2 tablets of your current supply then pick up higher strength (124mtablets) and take 1 tablet daily)  CONTINUE all other medications as prescribed.

## 2017-11-10 NOTE — Patient Instructions (Signed)
It is great to see you today Continue working on increasing your use of the CPAP. Your goal is to wear it every night. When you wear the CPAP your apnea is well controlled. Continue on CPAP at bedtime. You appear to be benefiting from the treatment Goal is to wear for at least 6 hours each night for maximal clinical benefit. Continue to work on weight loss, as the link between excess weight  and sleep apnea is well established.  Do not drive if sleepy. Remember to clean mask, tubing, filter, and reservoir once weekly with soapy water.  Follow up with Judson Roch NP 6 weeks  or before as needed.  Follow up with [name] in the [place] clinic next week. Please contact office for sooner follow up if symptoms do not improve or worsen or seek emergency care

## 2017-11-10 NOTE — Assessment & Plan Note (Addendum)
Compliant with trach care daily and with poor compliance with CPAP. I feel that there is a psychological component to this as patient feels secure and safe with her trach. We have discussed 100% CPAP use before decannulation of trachea can occur  Trach changes every 4-5 weeks to the trach clinic. Plan Continue working on increasing your use of the CPAP. Your goal is to wear it every night. When you wear the CPAP your apnea is well controlled. Continue on CPAP at bedtime. You appear to be benefiting from the treatment Goal is to wear for at least 6 hours each night for maximal clinical benefit. Continue to work on weight loss, as the link between excess weight  and sleep apnea is well established.  Do not drive if sleepy. Remember to clean mask, tubing, filter, and reservoir once weekly with soapy water.  Follow up with Judson Roch NP 6 weeks  or before as needed.  Follow up with [name] in the [place] clinic next week. Please contact office for sooner follow up if symptoms do not improve or worsen or seek emergency care

## 2017-11-10 NOTE — Progress Notes (Signed)
History of Present Illness [name] [name] is a 37 y.o. female with with known history significant sleep apnea, and chronic tracheostomy dependence.She is followed by [name], NP in the trach clinic. She was followed by Dr. [name] in the  office for OSA.  HPI: 37 year old female followed for obstructive sleep apneaand chronictrach since 05/2016 She was diagnosed with sleep apnea October 2012 with an AHI at 75. Hospital admission October 2017 with critical illness requiring tracheostomy. She is seen in the trach clinic for trach changes every 5- 6 weeks by [name].She has anxiety regarding decannulation, and 100% use of CPAP.   Last O&O done 06/2017 indicated need for 2 L oxygen at bedtime with uncapped trach.  She states she is wearing nocturnal oxygen with trach uncapped at bedtime at 2 L.She was seen in the trach clinic 07/27/2017, and she had a trach change ( Size #5 uncuffed Bivona trach). She states that she is having no issues with her trach. She states she has her  trach changes every 5 weeks, next scheduled for 09/01/2017 . Per [employer] note, she has  proven she does not need tracheostomy from an airway standpoint, however he will not decannulate her until she can be successful and compliant with CPAP at night.She understands this. She states she has some anxiety about decannulation. She spoke with [name] about this yesterday, and she is going to try easing her way into CPAP usage for longer periods of time once she has the new mask.She did follow up with cardiology 12/5, and they have made adjustments to her antihypertensive medications.She has been scheduled several times with [place] and has cancelled or been a no show. She does however, keep her appointments at the trach clinic.  I think the more Gage wears the CPAP, and sees how effectively it is controlling her sleep apnea, she will be more likely to transition over to a full-time CPAP when she can endorse 100% compliance.   At this point.  [name] will be able to consider decannulation of her trach.  Magdalene denies fever, chest pain, orthopnea, or hemoptysis.   11/10/2017  Pt. Presents for follow up. She is non-compliant with her CPAP at night. She knows that she will not be de cannulated until she is 100% compliant with her CPAP. We discussed that she is young and that decannulation would be a really good option for her.Her down Load shows poor usage. She only wore her CPAP for 4 nights and then only for about 2 hours a night.However, when she wears it it is effective. AHI is 0.1 We discussed that the CPAP works when she uses it. This should reassure her that it is a good option when compared to her trach.  She understands that she has been 100% compliant with her CPAP treatment before she can be decannulated.  Download 09/04/2017-10/03/2017 Usage days 6 of 30 days or 20% Greater than 4 hours 1 day or 3% Less than 4 hours 5 days or 17% Average usage 1 hour 29 minutes AutoSet 5 cm H2O-15 cm H2O Median pressure 6.1 cm H2O Maximum pressure 9.4 cm H2O AHI is 0.1  Test Results: ONO 06/29/2017>>Study shows she needs O2 2lpm at bedtime w/ uncapped trach if she is not going to wear CPAP (with capped trach). Pt. States she prefers to wear CPAP and not use nocturnal oxygen. 11/16/2016 sleep study showed severe sleep apnea, AHI 59, optimal C Pap pressure was 12 cm of H2O (done with capped trach )  06/08/2017 Echo>> EF : 55-60%, PAP 15 mm Hg    Test Results: Down Load 09/04/2017-10/03/2017 Usage 6/30 days  CBC Latest Ref Rng & Units 09/20/2016 09/19/2016 09/18/2016  WBC 4.0 - 10.5 K/uL 8.4 8.7 9.1  Hemoglobin 12.0 - 15.0 g/dL 9.1(L) 9.4(L) 9.8(L)  Hematocrit 36.0 - 46.0 % 27.6(L) 28.4(L) 29.7(L)  Platelets 150 - 400 K/uL 469(H) 484(H) 469(H)    BMP Latest Ref Rng & Units 09/01/2017 07/27/2017 06/08/2017  Glucose 65 - 99 mg/dL 93 104(H) 105(H)  BUN 6 - 20 mg/dL 19 16 22(H)  Creatinine 0.57 - 1.00 mg/dL 1.12(H) 0.95  1.11(H)  BUN/Creat Ratio 9 - _0 Sodium 134 - 144 mmol/L 143 141 137  Potassium 3.5 - 5.2 mmol/L 4.1 4.4 4.5  Chloride 96 - 106 mmol/L 97 99 100  CO2 20 - 29 mmol/L 30(H) 28 25  Calcium 8.7 - 10.2 mg/dL 9.3 9.2 9.0    BNP    Component Value Date/Time   BNP 175.1 (H) 09/15/2016 1418    ProBNP No results found for: PROBNP  PFT No results found for: FEV1PRE, FEV1POST, FVCPRE, FVCPOST, TLC, DLCOUNC, PREFEV1FVCRT, PSTFEV1FVCRT  No results found.   Past medical hx Past Medical History:  Diagnosis Date  . Chronic diastolic heart failure ([place])    Echo 10/12: EF 50-55 // b. Echo 10/17: EF 50-55, mild BAE, PASP 42 // Echo 10/18: Mild concentric LVH, EF 55-60, normal wall motion, mild MAC, severe LAE, mild TR, PASP 15, trivial pericardial effusion  . CKD (chronic kidney disease) stage 4, GFR 15-29 ml/min (HCC) 10/25/2016   admitted in 2/18 with SCr of 10 in the setting of dehydration from illness, etc >> improved to 5 at DC  . DM2 (diabetes mellitus, type 2) (HCC)    A1c 7.2 in 05/2016  . Gestational diabetes mellitus in pregnancy 05/24/2013  . HTN in pregnancy, chronic 05/24/2013  . Hypertensive heart disease with CHF (congestive heart failure) ([place]) 05/25/2016  . Morbid obesity ([place])   . OSA (obstructive sleep apnea) 07/09/2011   s/p Trach 2/2 prolonged respiratory failure during admx for CHF in 05/2016  . Pulmonary hypertension ([place])    secondary from CHF/OSA  . Sinus tachycardia      Social History   Tobacco Use  . Smoking status: Never Smoker  . Smokeless tobacco: Never Used  Substance Use Topics  . Alcohol use: No  . Drug use: No    Ms.Shiplett reports that she has never smoked. She has never used smokeless tobacco. She reports that she does not drink alcohol or use drugs.  Tobacco Cessation: Patient is a never smoker  Past surgical hx, Family hx, Social hx all reviewed.  Current Outpatient Medications on File Prior to Visit  Medication Sig  . amLODipine  (NORVASC) 10 MG tablet Take 1 tablet (10 mg total) by mouth daily.  . carvedilol (COREG) 25 MG tablet Take 25 mg by mouth 2 (two) times daily with a meal.  . fluticasone (FLONASE) 50 MCG/ACT nasal spray Place 2 sprays into both nostrils 2 (two) times daily.  . furosemide (LASIX) 40 MG tablet TAKE 1 TABLET BY MOUTH EVERY DAY  . hydrALAZINE (APRESOLINE) 100 MG tablet Take 1 tablet (100 mg total) by mouth 3 (three) times daily.  . isosorbide dinitrate (ISORDIL) 30 MG tablet TAKE 1 TABLET BY MOUTH 3 TIMES A DAY  . lisinopril (PRINIVIL,ZESTRIL) 40 MG tablet Take 1 tablet (40 mg total) by mouth daily.  Marland Kitchen nystatin ointment (MYCOSTATIN)  Apply topically 2 (two) times daily.   No current facility-administered medications on file prior to visit.      Allergies  Allergen Reactions  . No Known Allergies     Review Of Systems:  Constitutional:   No  weight loss, night sweats,  Fevers, chills, fatigue, or  lassitude.  HEENT:   No headaches,  Difficulty swallowing,  Tooth/dental problems, or  Sore throat,                No sneezing, itching, ear ache, nasal congestion, post nasal drip,   CV:  No chest pain,  Orthopnea, PND, swelling in lower extremities, anasarca, dizziness, palpitations, syncope.   GI  No heartburn, indigestion, abdominal pain, nausea, vomiting, diarrhea, change in bowel habits, loss of appetite, bloody stools.   Resp: No shortness of breath with exertion or at rest.  No excess mucus, no productive cough,  No non-productive cough,  No coughing up of blood.  No change in color of mucus.  No wheezing.  No chest wall deformity  Skin: no rash or lesions.  GU: no dysuria, change in color of urine, no urgency or frequency.  No flank pain, no hematuria   MS:  No joint pain or swelling.  No decreased range of motion.  No back pain.,  Very thick neck  Psych:  No change in mood or affect. No depression or anxiety.  No memory loss.   Vital Signs BP 140/88 (BP Location: Right Arm, Cuff  Size: Large)   Pulse 89   Ht _0  (1.651 m)   Wt (!) 388 lb 12.8 oz (176.4 kg)   SpO2 90%   BMI 64.70 kg/m    Physical Exam:  General- No distress,  A&Ox3, pleasant ENT: No sinus tenderness, TM clear, pale nasal mucosa, no oral exudate,no post nasal drip, no LAN, trachea is midline and secure with good air movement upon auscultation nondistended, obese Cardiac: S1, S2, regular rate and rhythm, no murmur Chest: No wheeze/ rales/ dullness; no accessory muscle use, no nasal flaring, no sternal retractions Abd.: Soft Non-tender, obese, nondistended Ext: No clubbing cyanosis, edema Neuro:  normal strength deconditioned secondary to body habitus,  Skin: No rashes, warm and dry Psych: normal mood and behavior   Assessment/Plan  OSA (obstructive sleep apnea) Compliant with trach care daily and with poor compliance with CPAP. I feel that there is a psychological component to this as patient feels secure and safe with her trach. We have discussed 100% CPAP use before decannulation of trachea can occur  Trach changes every 4-5 weeks to the trach clinic. Plan Continue working on increasing your use of the CPAP. Your goal is to wear it every night. When you wear the CPAP your apnea is well controlled. Continue on CPAP at bedtime. You appear to be benefiting from the treatment Goal is to wear for at least 6 hours each night for maximal clinical benefit. Continue to work on weight loss, as the link between excess weight  and sleep apnea is well established.  Do not drive if sleepy. Remember to clean mask, tubing, filter, and reservoir once weekly with soapy water.  Follow up with Judson Roch NP 6 weeks  or before as needed.  Follow up with [name] in the [place] clinic next week. Please contact office for sooner follow up if symptoms do not improve or worsen or seek emergency care      Magdalen Spatz, NP 11/10/2017  3:12 PM

## 2017-11-21 ENCOUNTER — Other Ambulatory Visit: Payer: Self-pay | Admitting: Cardiology

## 2017-11-22 ENCOUNTER — Other Ambulatory Visit: Payer: Self-pay | Admitting: *Deleted

## 2017-11-22 ENCOUNTER — Telehealth: Payer: Self-pay | Admitting: Nurse Practitioner

## 2017-11-22 MED ORDER — CARVEDILOL 25 MG PO TABS: 25.0000 mg | ORAL_TABLET | Freq: Two times a day (BID) | ORAL | 3 refills | Status: DC

## 2017-11-22 NOTE — Telephone Encounter (Signed)
S/w pt confirmed pt is not pregnant.  [facility] and stated all medication were filled. Coreg will be refilled again today since will be out of office till Friday.

## 2017-11-22 NOTE — Telephone Encounter (Signed)
New Message   Pt c/o medication issue:  1. Name of Medication: carvedilol (COREG) 25 MG tablet  2. How are you currently taking this medication (dosage and times per day)? TAKE 1 TABLET (25 MG TOTAL) BY MOUTH 2 (TWO) TIMES DAILY  3. Are you having a reaction (difficulty breathing--STAT)? no  4. What is your medication issue? The pharmacy states that the records they have say the pt is pregnant and wants to make sure its ok to take this medication. Please

## 2017-11-22 NOTE — Telephone Encounter (Signed)
Please call - verify if she is pregnant. Her medicines would all need to be adjusted.   Would make Dr. Marlou Porch aware.  Burtis Junes, RN, [facility] [address] [place], [place]  [address] [phone]

## 2017-11-23 ENCOUNTER — Inpatient Hospital Stay (HOSPITAL_COMMUNITY): Admission: RE | Admit: 2017-11-23 | Payer: Self-pay | Source: Ambulatory Visit

## 2017-11-30 ENCOUNTER — Ambulatory Visit (HOSPITAL_COMMUNITY)
Admission: RE | Admit: 2017-11-30 | Discharge: 2017-11-30 | Disposition: A | Discharge status: Home / Self Care | Payer: Medicaid Other | Source: Ambulatory Visit | Attending: Acute Care | Admitting: Acute Care

## 2017-11-30 DIAGNOSIS — G4730 Sleep apnea, unspecified: Secondary | ICD-10-CM | POA: Diagnosis not present

## 2017-11-30 DIAGNOSIS — Z930 Tracheostomy status: Secondary | ICD-10-CM | POA: Diagnosis not present

## 2017-11-30 DIAGNOSIS — Z9911 Dependence on respirator [ventilator] status: Secondary | ICD-10-CM | POA: Diagnosis not present

## 2017-11-30 DIAGNOSIS — Z430 Encounter for attention to tracheostomy: Secondary | ICD-10-CM | POA: Insufficient documentation

## 2017-11-30 DIAGNOSIS — Z4682 Encounter for fitting and adjustment of non-vascular catheter: Secondary | ICD-10-CM | POA: Insufficient documentation

## 2017-11-30 NOTE — Progress Notes (Signed)
[facility]   Reason for visit: Routine trach care   HPI: This is a 37 year old female well-known to me.  She is tracheostomy dependent in the setting of severe sleep apnea.  She is been tracheostomy dependent for approximately 1 year now following hospitalization for prolonged ventilator dependence.  She currently is working on CPAP at home with capping trials and efforts to hopefully decannulate her Sunday in the near future.  She reports today for routine tracheostomy change  ROS General: No sick exposures, no fever or chills.  No fatigue or weakness. HEENT: No headache, no sore throat, no sinus congestion.  She does report her tracheostomy site to be a little more tender than usual, however this is typical as she nears trach change time.  She is approximately 1 week overdue. Pulmonary: Denies shortness of breath, does have mild cough but this is nonproductive.  She denies wheezing. Cardiac: No chest pain, palpitations, or orthopnea.  Abdomen: No nausea vomiting diarrhea.  No acute changes.  GU not checked.  Musculoskeletal no complaints.  Neuro no headaches, gait disturbance, memory loss.  Vital signs: Heart rate 85 blood pressure 1 6495 respirations 24 saturations 98% on room air this is with tracheostomy occluded with red occlusive  Exam: General: This is a 37 year old African-American female she is currently in no acute distress she ambulated into the tracheostomy clinic without difficulty. HEENT: Normocephalic atraumatic.  She has a #5 Bivona tracheostomy this is cuffless.  There is no erythema or drainage from the tracheostomy stoma Pulmonary: Clear to auscultation diminished bases no accessory use Cardiac: Regular rate and rhythm without murmur rub or gallop Abdomen: Soft, nontender, no organomegaly. 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  She had does have a CPAP at home and she is working through the pulmonary clinic.  It is my hope that we can get her to use the CPAP routinely, from a tracheostomy standpoint she is ready for decannulation anytime in the near future  Plan Follow-up trach clinic 4-5 weeks for routine tracheostomy change Continue routine trach care Continue to work on collar trials with hope to decannulate in the near future    Visit time: 35 minutes.   Erick Colace ACNP-BC [place]

## 2017-11-30 NOTE — Progress Notes (Signed)
Tracheostomy Procedure Note  [name] [name] [id number] [birth year]  Pre Procedure Tracheostomy Information  Trach Brand: Bivona Size: 5.0 Style: Uncuffed Secured by: Velcro   Procedure: trach change    Post Procedure Tracheostomy Information  Trach Brand: bivona Size: 5.0 Style: Uncuffed Secured by: Velcro   Post Procedure Evaluation:  ETCO2 positive color change from yellow to purple : Yes.   Vital signs:blood pressure 164/95, pulse 85, respirations 24 and pulse oximetry 98 % Patients current condition: stable Complications: No apparent complications Trach site exam: clean, dry Wound care done: dry, sterile, clean and 4 x 4 gauze Patient did tolerate procedure well.   Education: none  Prescription needs: none    Additional needs: none

## 2017-12-15 ENCOUNTER — Ambulatory Visit: Payer: Self-pay

## 2017-12-15 NOTE — Progress Notes (Deleted)
Patient ID: [name] [name]                 DOB: 10-08-1980                      MRN: [id number]     HPI: [name] [name] is a 37 y.o. female patient referred by Dr. Marlou Porch to HTN clinic. PMH is significant for HTN, OSA, CHF (EF 50-55%), CKD, obesity, gestational DM. She was admitted in 10/17 respiratory failure in the setting of HTN emergency with pulmonary edema and OHS/OSA requiring intubation. She ultimately underwent tracheostomy due to prolonged respiratory failure. She was admitted in 1/18 with acute renal failure in the setting of dehydration. She has been followed in our hypertension clinic to help with control of blood pressure. She has been seen in HTN clinic since April 2018. Her pressures have remained essentially unchanged since that time with the addition and titration of several medications. At her last visit her amlodipine was increased to 45m daily.   She presents today for follow up and additional medication titration. She denies SOB, chest pain, head aches, and dizziness.   Current HTN meds:  Amlodipine 152mdaily in the morning Furosemide 4057maily Hydralazine 100m22mD  Isosorbide dinitrate 30mg32m Lisinopril 40mg 23my in the morning  Carvedilol 25mg B94mPreviously tried: labetalol - BP not well controlled.   BP goal: <130/80 mmHg    Family History: The patient's family history includes Birth defects in her paternal grandfather; Diabetes in her other; Hypertension in her mother.  Social History: Denies tobacco products and alcohol  Diet: Eat most of her meals prepared from home. She limits salt. No coffee or sodas. Occasional tea intake not daily. Mostly drinks water or cranberry juice.   Exercise: Walks (generally 20-30 minutes) 2-3 times per week.   Home BP readings: she is unsure if home cuff measures appropriately due to fit.   Wt Readings from Last 3 Encounters:  11/10/17 (!) 388 lb 12.8 oz (176.4 kg)  07/28/17 (!) 398 lb 12.8 oz (180.9 kg)    07/27/17 (!) 403 lb (182.8 kg)   BP Readings from Last 3 Encounters:  11/10/17 140/88  11/10/17 (!) 144/86  10/06/17 (!) 152/86   Pulse Readings from Last 3 Encounters:  11/10/17 89  11/10/17 87  10/06/17 81    Renal function: CrCl cannot be calculated (Patient's most recent lab result is older than the maximum 21 days allowed.).  Past Medical History:  Diagnosis Date  . Chronic diastolic heart failure (HCC)   [place] 10/12: EF 50-55 // b. Echo 10/17: EF 50-55, mild BAE, PASP 42 // Echo 10/18: Mild concentric LVH, EF 55-60, normal wall motion, mild MAC, severe LAE, mild TR, PASP 15, trivial pericardial effusion  . CKD (chronic kidney disease) stage 4, GFR 15-29 ml/min (HCC) 10/25/2016   admitted in 2/18 with SCr of 10 in the setting of dehydration from illness, etc >> improved to 5 at DC  . DM2 (diabetes mellitus, type 2) (HCC)    A1c 7.2 in 05/2016  . Gestational diabetes mellitus in pregnancy 05/24/2013  . HTN in pregnancy, chronic 05/24/2013  . Hypertensive heart disease with CHF (congestive heart failure) (HCC) [id number]  . Morbid obesity (HCC)   [place] (obstructive sleep apnea) 07/09/2011   s/p Trach 2/2 prolonged respiratory failure during admx for CHF in 05/2016  . Pulmonary hypertension (HCC)   [place] from CHF/OSA  . Sinus tachycardia  Current Outpatient Medications on File Prior to Visit  Medication Sig Dispense Refill  . amLODipine (NORVASC) 10 MG tablet Take 1 tablet (10 mg total) by mouth daily. 90 tablet 1  . carvedilol (COREG) 25 MG tablet Take 1 tablet (25 mg total) by mouth 2 (two) times daily. 180 tablet 3  . fluticasone (FLONASE) 50 MCG/ACT nasal spray Place 2 sprays into both nostrils 2 (two) times daily. 16 g 2  . furosemide (LASIX) 40 MG tablet TAKE 1 TABLET BY MOUTH EVERY DAY 30 tablet 3  . hydrALAZINE (APRESOLINE) 100 MG tablet Take 1 tablet (100 mg total) by mouth 3 (three) times daily. 90 tablet 3  . isosorbide dinitrate (ISORDIL) 30 MG tablet  TAKE 1 TABLET BY MOUTH 3 TIMES A DAY 90 tablet 11  . lisinopril (PRINIVIL,ZESTRIL) 40 MG tablet Take 1 tablet (40 mg total) by mouth daily. 90 tablet 3  . nystatin ointment (MYCOSTATIN) Apply topically 2 (two) times daily. 30 g 0   No current facility-administered medications on file prior to visit.     Allergies  Allergen Reactions  . No Known Allergies     unknown if currently breastfeeding. Pt is not currently breast feeding and is not planning to become pregnant  Assessment/Plan: Hypertension: BP    Thank you, Lelan Pons. Patterson Hammersmith, [facility]  12/15/2017 7:31 AM

## 2017-12-22 ENCOUNTER — Ambulatory Visit: Payer: Self-pay | Admitting: Acute Care

## 2017-12-29 ENCOUNTER — Ambulatory Visit: Payer: Self-pay

## 2017-12-29 NOTE — Progress Notes (Deleted)
Patient ID: [name] [name]                 DOB: 12-Jan-1981                      MRN: [id number]     HPI: [name] [name] is a 37 y.o. female patient referred by Dr. Marlou Porch to HTN clinic. PMH is significant for HTN, OSA, CHF (EF 50-55%), CKD, obesity, gestational DM. She was admitted in 10/17 respiratory failure in the setting of HTN emergency with pulmonary edema and OHS/OSA requiring intubation. She ultimately underwent tracheostomy due to prolonged respiratory failure. She was admitted in 1/18 with acute renal failure in the setting of dehydration. She has been followed in our hypertension clinic to help with control of blood pressure. She has been seen in HTN clinic since April 2018. Her pressures have remained essentially unchanged since that time with the addition and titration of several medications. At her last visit her amlodipine was increased to 32m daily.   She presents today for follow up and additional medication titration. She denies SOB, chest pain, head aches, and dizziness.   Current HTN meds:  Amlodipine 119mdaily in the morning Furosemide 4043maily Hydralazine 100m48mD  Isosorbide dinitrate 30mg65m Lisinopril 40mg 64my in the morning  Carvedilol 25mg B53mPreviously tried: labetalol - BP not well controlled.   BP goal: <130/80 mmHg    Family History: The patient's family history includes Birth defects in her paternal grandfather; Diabetes in her other; Hypertension in her mother.  Social History: Denies tobacco products and alcohol  Diet: Eat most of her meals prepared from home. She limits salt. No coffee or sodas. Occasional tea intake not daily. Mostly drinks water or cranberry juice.   Exercise: Walks (generally 20-30 minutes) 2-3 times per week.   Home BP readings: she is unsure if home cuff measures appropriately due to fit.   Wt Readings from Last 3 Encounters:  11/10/17 (!) 388 lb 12.8 oz (176.4 kg)  07/28/17 (!) 398 lb 12.8 oz (180.9 kg)    07/27/17 (!) 403 lb (182.8 kg)   BP Readings from Last 3 Encounters:  11/10/17 140/88  11/10/17 (!) 144/86  10/06/17 (!) 152/86   Pulse Readings from Last 3 Encounters:  11/10/17 89  11/10/17 87  10/06/17 81    Renal function: CrCl cannot be calculated (Patient's most recent lab result is older than the maximum 21 days allowed.).  Past Medical History:  Diagnosis Date  . Chronic diastolic heart failure (HCC)   [place] 10/12: EF 50-55 // b. Echo 10/17: EF 50-55, mild BAE, PASP 42 // Echo 10/18: Mild concentric LVH, EF 55-60, normal wall motion, mild MAC, severe LAE, mild TR, PASP 15, trivial pericardial effusion  . CKD (chronic kidney disease) stage 4, GFR 15-29 ml/min (HCC) 10/25/2016   admitted in 2/18 with SCr of 10 in the setting of dehydration from illness, etc >> improved to 5 at DC  . DM2 (diabetes mellitus, type 2) (HCC)    A1c 7.2 in 05/2016  . Gestational diabetes mellitus in pregnancy 05/24/2013  . HTN in pregnancy, chronic 05/24/2013  . Hypertensive heart disease with CHF (congestive heart failure) (HCC) [id number]  . Morbid obesity (HCC)   [place] (obstructive sleep apnea) 07/09/2011   s/p Trach 2/2 prolonged respiratory failure during admx for CHF in 05/2016  . Pulmonary hypertension (HCC)   [place]  . Sinus tachycardia  Current Outpatient Medications on File Prior to Visit  Medication Sig Dispense Refill  . amLODipine (NORVASC) 10 MG tablet Take 1 tablet (10 mg total) by mouth daily. 90 tablet 1  . carvedilol (COREG) 25 MG tablet Take 1 tablet (25 mg total) by mouth 2 (two) times daily. 180 tablet 3  . fluticasone (FLONASE) 50 MCG/ACT nasal spray Place 2 sprays into both nostrils 2 (two) times daily. 16 g 2  . furosemide (LASIX) 40 MG tablet TAKE 1 TABLET BY MOUTH EVERY DAY 30 tablet 3  . hydrALAZINE (APRESOLINE) 100 MG tablet Take 1 tablet (100 mg total) by mouth 3 (three) times daily. 90 tablet 3  . isosorbide dinitrate (ISORDIL) 30 MG tablet  TAKE 1 TABLET BY MOUTH 3 TIMES A DAY 90 tablet 11  . lisinopril (PRINIVIL,ZESTRIL) 40 MG tablet Take 1 tablet (40 mg total) by mouth daily. 90 tablet 3  . nystatin ointment (MYCOSTATIN) Apply topically 2 (two) times daily. 30 g 0   No current facility-administered medications on file prior to visit.     Allergies  Allergen Reactions  . No Known Allergies     unknown if currently breastfeeding. Pt is not currently breast feeding and is not planning to become pregnant  Assessment/Plan: Hypertension: BP    Thank you, Lelan Pons. Patterson Hammersmith, [facility]  12/29/2017 7:05 AM

## 2018-01-04 ENCOUNTER — Ambulatory Visit: Payer: Self-pay | Admitting: Acute Care

## 2018-01-05 ENCOUNTER — Inpatient Hospital Stay (HOSPITAL_COMMUNITY): Admission: RE | Admit: 2018-01-05 | Payer: Self-pay | Source: Ambulatory Visit

## 2018-01-18 ENCOUNTER — Inpatient Hospital Stay (HOSPITAL_COMMUNITY): Admission: RE | Admit: 2018-01-18 | Payer: Self-pay | Source: Ambulatory Visit

## 2018-01-19 ENCOUNTER — Ambulatory Visit (HOSPITAL_COMMUNITY)
Admission: RE | Admit: 2018-01-19 | Discharge: 2018-01-19 | Disposition: A | Discharge status: Home / Self Care | Payer: Medicaid Other | Source: Ambulatory Visit | Attending: Nurse Practitioner | Admitting: Nurse Practitioner

## 2018-01-19 DIAGNOSIS — Z430 Encounter for attention to tracheostomy: Secondary | ICD-10-CM | POA: Insufficient documentation

## 2018-01-19 DIAGNOSIS — Z930 Tracheostomy status: Secondary | ICD-10-CM | POA: Diagnosis not present

## 2018-01-19 NOTE — Progress Notes (Signed)
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 [name] [name] dependent now for almost 2 years. Has been working now several months now w/ CPAP at home w/ hopes to have trach removed.  Last visit 4/10: was still working on CPAP but was making some progress. I have felt for a long time she can be decannulated as soon as she proves she can be successful w/ CPAP.  Today 5/30: No new complaints.  Does have some mild trach site discomfort but this is typical and she gets time for tracheostomy change.  She is doing better with CPAP at night.  Still has a hard time adjusting if she wakes up in the middle the night, says the pressure from the CPAP usually too high to tolerate  ROS General: No fever chills activity changes HEENT: No headache, no sinus congestion, no postnasal drip, no tracheal drainage.  Does have mild trach site discomfort again mentioned above typical at this point.  Pulmonary: No pain, wheezing, dyspnea or cough cardiac: No chest pain palpitations or orthopnea musculoskeletal: No focal weakness or gait disturbance neuro: No headaches, no focal weakness.  GI: No nausea vomiting diarrhea or appetite change GU: No dysuria endocrine: No hot or cold intolerance  Vital signs: Reviewed  Exam: General: 37 year old African-American female ambulatory, and in no acute distress HEENT: Normocephalic atraumatic mucous membranes moist.  She has a number 5 uncuffed Bivona tracheostomy, she is using an occlusive With excellent phonation Pulmonary: Clear to auscultation Cardiac: Regular rate and rhythm no murmur rub or gallop Abdomen: Soft nontender Extremities: No edema Neuro: Awake oriented no focal deficits  Trach change/procedure Tracheostomy change.  The existing Devona tracheostomy was removed.  The tracheostomy stoma was evaluated and was noted to be unremarkable.  She has her chronic scarring  tissues. 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   Discussion Has proven she does not need trach for day to day activities.  I am happy to see she is doing better with CPAP tolerance.  We discussed this further.  The biggest issue she has is not the initiation of CPAP, she actually feels the CPAP once its on and running is quite comfortable.  Her challenge has been if she wakes up in the middle the night and puts the machine on pause she does not tolerate resuming at full pressure.  I have suggested she try turning the CPAP off and landing ramp back up to pressure slowly to see if that improves her compliance  Plan Return for routine trach change in 5 weeks.  She is hopeful this may be sooner if we can master CPAP routine Continue to hope for decannulation in the near future    Visit time: 35 minutes.   Erick Colace ACNP-BC [place]

## 2018-01-19 NOTE — Progress Notes (Signed)
Tracheostomy Procedure Note  [name] [name] [id number] Oct 13, 1980  Pre Procedure Tracheostomy Information  Trach Brand: .Bivona Size: 5.0 Style: Uncuffed Secured by: Velcro   Procedure: trach change    Post Procedure Tracheostomy Information  Trach Brand: Bivona Size: 5.0 Style: Uncuffed Secured by: Velcro   Post Procedure Evaluation:  ETCO2 positive color change from yellow to purple : Yes.   Vital signs:blood pressure 184/95, pulse 98, respirations 15 and pulse oximetry 91 % Patients current condition: stable Complications: No apparent complications Trach site exam: clean, dry Wound care done: dry and 4 x 4 gauze Patient did tolerate procedure well.   Education: none  Prescription needs: none    Additional needs:5 week follow up

## 2018-01-22 ENCOUNTER — Other Ambulatory Visit: Payer: Self-pay | Admitting: Physician Assistant

## 2018-02-22 ENCOUNTER — Ambulatory Visit (HOSPITAL_COMMUNITY): Payer: Self-pay

## 2018-03-01 ENCOUNTER — Ambulatory Visit (HOSPITAL_COMMUNITY): Payer: Self-pay

## 2018-03-02 ENCOUNTER — Ambulatory Visit (HOSPITAL_COMMUNITY): Payer: Self-pay

## 2018-03-06 ENCOUNTER — Other Ambulatory Visit: Payer: Self-pay | Admitting: Cardiology

## 2018-03-06 DIAGNOSIS — I10 Essential (primary) hypertension: Secondary | ICD-10-CM

## 2018-03-15 ENCOUNTER — Encounter: Payer: Self-pay | Admitting: Adult Health

## 2018-03-15 ENCOUNTER — Ambulatory Visit (INDEPENDENT_AMBULATORY_CARE_PROVIDER_SITE_OTHER): Payer: Medicaid Other | Admitting: Adult Health

## 2018-03-15 ENCOUNTER — Ambulatory Visit (HOSPITAL_COMMUNITY)
Admission: RE | Admit: 2018-03-15 | Discharge: 2018-03-15 | Disposition: A | Discharge status: Home / Self Care | Payer: Medicaid Other | Source: Ambulatory Visit | Attending: Acute Care | Admitting: Acute Care

## 2018-03-15 DIAGNOSIS — Z930 Tracheostomy status: Secondary | ICD-10-CM | POA: Insufficient documentation

## 2018-03-15 DIAGNOSIS — G4733 Obstructive sleep apnea (adult) (pediatric): Secondary | ICD-10-CM

## 2018-03-15 NOTE — Progress Notes (Signed)
_0  ID: [name] [name], female    DOB: 09/20/1980, 37 y.o.   MRN: [id number]  Chief Complaint  Patient presents with  . Follow-up    OSA     Referring provider: Flossie Buffy, NP  HPI: 37 year old female followed for obstructive sleep apnea with chronic trach  She was diagnosed with sleep apnea October 2012 with an AHI at 73. Hospital admission October 2017 with critical illness requiring tracheostomy.  11/16/2016 sleep study showed severe sleep apnea, AHI 59, optimal C Pap pressure was 12 cm of H2O (done with capped trach ) .   03/15/2018 Follow up ; OSA , Trach  Patient returns for a 59-monthfollow-up.  Patient has underlying severe sleep apnea.  She has a chronic trach.  She says that she has been doing well with her trach.  She continues to want to have this removed.  She is wearing CPAP at bedtime.  She Her trach capped during this time.  Download does not show great compliance.  With only using th about 7 days out of the last 30 where she wore more than 4 hours.  When she does wear it her apnea is well controlled with AHI 0.4.  Her average usage was around 3.5 hours.  She is on auto CPAP 5-15 summers H2O. We discussed the importance of compliance. We also discussed weight loss.  She would like referral to the weight loss clinic. She continues to follow-up at the trach clinic.  She denies any redness or drainage.    Allergies  Allergen Reactions  . No Known Allergies     Immunization History  Administered Date(s) Administered  . Influenza,inj,Quad PF,6+ Mos 06/30/2016    Past Medical History:  Diagnosis Date  . Chronic diastolic heart failure ([place]    Echo 10/12: EF 50-55 // b. Echo 10/17: EF 50-55, mild BAE, PASP 42 // Echo 10/18: Mild concentric LVH, EF 55-60, normal wall motion, mild MAC, severe LAE, mild TR, PASP 15, trivial pericardial effusion  . CKD (chronic kidney disease) stage 4, GFR 15-29 ml/min (HCC) 10/25/2016   admitted in 2/18 with SCr of 10  in the setting of dehydration from illness, etc >> improved to 5 at DC  . DM2 (diabetes mellitus, type 2) (HCC)    A1c 7.2 in 05/2016  . Gestational diabetes mellitus in pregnancy 05/24/2013  . HTN in pregnancy, chronic 05/24/2013  . Hypertensive heart disease with CHF (congestive heart failure) ([place] 05/25/2016  . Morbid obesity ([place]   . OSA (obstructive sleep apnea) 07/09/2011   s/p Trach 2/2 prolonged respiratory failure during admx for CHF in 05/2016  . Pulmonary hypertension ([place]    secondary from CHF/OSA  . Sinus tachycardia     Tobacco History: Social History   Tobacco Use  Smoking Status Never Smoker  Smokeless Tobacco Never Used   Counseling given: Not Answered   Outpatient Medications Prior to Visit  Medication Sig Dispense Refill  . amLODipine (NORVASC) 10 MG tablet Take 1 tablet (10 mg total) by mouth daily. 90 tablet 1  . fluticasone (FLONASE) 50 MCG/ACT nasal spray Place 2 sprays into both nostrils 2 (two) times daily. 16 g 2  . furosemide (LASIX) 40 MG tablet TAKE 1 TABLET BY MOUTH EVERY DAY 30 tablet 3  . hydrALAZINE (APRESOLINE) 100 MG tablet TAKE 1 TABLET (100 MG TOTAL) BY MOUTH 3 (THREE) TIMES DAILY. 270 tablet 1  . isosorbide dinitrate (ISORDIL) 30 MG tablet TAKE 1 TABLET BY MOUTH 3 TIMES A  DAY 90 tablet 11  . carvedilol (COREG) 25 MG tablet Take 1 tablet (25 mg total) by mouth 2 (two) times daily. 180 tablet 3  . lisinopril (PRINIVIL,ZESTRIL) 40 MG tablet Take 1 tablet (40 mg total) by mouth daily. 90 tablet 3  . nystatin ointment (MYCOSTATIN) Apply topically 2 (two) times daily. (Patient not taking: Reported on 03/15/2018) 30 g 0   No facility-administered medications prior to visit.      Review of Systems  Constitutional:   No  weight loss, night sweats,  Fevers, chills, fatigue, or  lassitude.  HEENT:   No headaches,  Difficulty swallowing,  Tooth/dental problems, or  Sore throat,                No sneezing, itching, ear ache, + nasal congestion,  post nasal drip,   CV:  No chest pain,  Orthopnea, PND, swelling in lower extremities, anasarca, dizziness, palpitations, syncope.   GI  No heartburn, indigestion, abdominal pain, nausea, vomiting, diarrhea, change in bowel habits, loss of appetite, bloody stools.   Resp:  .  No chest wall deformity  Skin: no rash or lesions.  GU: no dysuria, change in color of urine, no urgency or frequency.  No flank pain, no hematuria   MS:  No joint pain or swelling.  No decreased range of motion.  No back pain.    Physical Exam  BP 124/84 (BP Location: Right Wrist, Cuff Size: Large)   Pulse 90   Ht _0  (1.676 m)   Wt (!) 389 lb (176.4 kg)   SpO2 91%   BMI 62.79 kg/m   GEN: A/Ox3; pleasant , NAD, obese    HEENT:  [place]/AT,  EACs-clear, TMs-wnl, NOSE-clear, THROAT-clear, no lesions, no postnasal drip or exudate noted.   NECK:  Supple w/ fair ROM; no JVD; normal carotid impulses w/o bruits; no thyromegaly or nodules palpated; no lymphadenopathy.   [name] is midline, dressing clean and dry . Trach capped.   RESP  Clear  P & A; w/o, wheezes/ rales/ or rhonchi. no accessory muscle use, no dullness to percussion  CARD:  RRR, no m/r/g, tr  peripheral edema, pulses intact, no cyanosis or clubbing.  GI:   Soft & nt; nml bowel sounds; no organomegaly or masses detected.   Musco: Warm bil, no deformities or joint swelling noted.   Neuro: alert, no focal deficits noted.    Skin: Warm, no lesions or rashes    Lab Results:  CBC  BNP  ProBNP No results found for: PROBNP  Imaging: No results found.   Assessment & Plan:   OSA (obstructive sleep apnea) Severe sleep apnea.  Patient is aware to sleep only with her CPAP with a capped trach.  Or to use her trach open with trach collar at 2 L of oxygen.  She would like to be decannulated.  However we have talked extensively about her CPAP compliance and she would need to document significant compliance over 90-day.  In order to consider  decannulation.  Plan  Patient Instructions  Try to wear CPAP each night , all night .  If not wearing CPAP , will need to un-cap Trach and use Trach collar At bedtime  .  Follow up in 4 weeks  Dr. Halford Chessman  Or Jaquavis Felmlee NP with CPAP download.  Referral to weight loss clinic       Morbid obesity ([place]) Wt loss  Pt educaiton  Refer to wt loss clinic   Tracheostomy dependence ([place]) Cont  w/ trach care  follow up trach clinic today as planned      [name], NP 03/15/2018

## 2018-03-15 NOTE — Progress Notes (Signed)
Tracheostomy Procedure Note  [name] [name] [id number] [birth year]  Pre Procedure Tracheostomy Information  Trach Brand: Bivona  Size: 5.0 Style: Uncuffed Secured by: Velcro   Procedure: trach change    Post Procedure Tracheostomy Information  Trach Brand: Bivona Size: 5.0 Style: Uncuffed Secured by: Velcro   Post Procedure Evaluation:  ETCO2 positive color change from yellow to purple : Yes.   Vital signs:blood pressure 156/90, pulse 95, respirations 20 and pulse oximetry 95 % Patients current condition: stable Complications: No apparent complications Trach site exam: clean, dry Wound care done: dry, clean and 4 x 4 gauze Patient did tolerate procedure well.   Education: none  Prescription needs: none    Additional needs: 4 week follow up

## 2018-03-15 NOTE — Progress Notes (Addendum)
[facility]   Reason for visit: Routine tracheostomy care  HPI: 37 year old female patient with a significant history of hypertension, HFpEF, and obstructive sleep apnea.  She is tracheostomy dependent, since an acute illness 2017 resulting respiratory failure.  We have been working for about a year now on CPAP compliance, and working towards decannulation.  She was seen by pulmonary earlier today, note indicates she still only used her CPAP about 7 days out of 30 where she was above 4 hours a day.  She has an auto CPAP which supports her at 5 to 15 cm of water.  An interesting note is that Hailie is recently learned that her sleeping position has significantly affects her CPAP mask seal, she previously had slept primarily on her side this resulted in a significant air leak which she contributed to her poor compliance in the past.  She has since learned that lying solely on her back  the air leak is resolved and she is been able to tolerate CPAP much more comfortably.  I am encouraged by this.  She returns today for routine tracheostomy care ROS General: No fever, malaise, arthralgias.  HEENT: Has had some nasal drip, little bit of a sore throat.  Some increasing cough and congestion.  She has a foul sweet smelling tracheal secretions noted with yellow-colored mucus.  Pulmonary: Cough is noted.  Productive at times.   denies shortness of breath or wheezing.  Cardiac: No chest pain palpitations extremity: No pain swelling edema abdomen: No nausea vomiting diarrhea GU: No dysuria frequency hesitancy neuro: No headache, seizures, gait disturbance   Vital signs: Heart rate 95 blood pressure 156/90 saturation 95% pulse oximetry  Exam: General: 37 year old female patient currently in no acute distress.  She is ambulatory, and walked into the tracheostomy clinic without difficulty HEENT normocephalic atraumatic.  She has a size #5 cuffless Bivona tracheostomy.  She does have some  purulent yellow foul-smelling discharge from the tracheostomy stoma, she reports recently her daughter was sick. Pulmonary: Clear to auscultation without accessory use Cardiac: Regular rate and rhythm Abdomen: Soft nontender no organomegaly Extremities: Brisk cap refill no edema Neuro: Awake oriented no focal deficits  Trach change/procedure Size #5 Bivona tracheostomy was removed without difficulty.  The tracheostomy site was evaluated, stoma unremarkable but did have purulent colored tracheostomy drainage.  A new size 5 cuffless Bivona tracheostomy was inserted without difficulty following this she was suctioned, and respiratory culture was sent. 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  Once again she is ready and has proven she does not need tracheostomy, however given her age and comorbidities it absolutely essential she can be compliant with her CPAP therapy.  She is making slow progress with this but until that time the tracheostomy will stay in place.  We will continue to work with her in the tracheostomy clinic with routine tracheostomy changes.   I will not remove the tracheostomy until we are certain she can be compliant with CPAP  Plan Return office visit 5 weeks Continue routine tracheostomy care Follow-up pulmonary scheduled. Continue CPAP at bedtime Continue to evaluate for timing in regards to tracheostomy removal    Visit time: 32 minutes.   Erick Colace ACNP-BC [place]

## 2018-03-15 NOTE — Assessment & Plan Note (Signed)
Severe sleep apnea.  Patient is aware to sleep only with her CPAP with a capped trach.  Or to use her trach open with trach collar at 2 L of oxygen.  She would like to be decannulated.  However we have talked extensively about her CPAP compliance and she would need to document significant compliance over 90-day.  In order to consider decannulation.  Plan  Patient Instructions  Try to wear CPAP each night , all night .  If not wearing CPAP , will need to un-cap Trach and use Trach collar At bedtime  .  Follow up in 4 weeks  Dr. Halford Chessman  Or Lovelle Deitrick NP with CPAP download.  Referral to weight loss clinic

## 2018-03-15 NOTE — Addendum Note (Signed)
Addended by: Shirlee More R on: 03/15/2018 02:06 PM   Modules accepted: Orders

## 2018-03-15 NOTE — Assessment & Plan Note (Signed)
Wt loss  Pt educaiton  Refer to wt loss clinic

## 2018-03-15 NOTE — Patient Instructions (Addendum)
Try to wear CPAP each night , all night .  If not wearing CPAP , will need to un-cap Trach and use Trach collar At bedtime  .  Follow up in 4 weeks  Dr. Halford Chessman  Or Ellijah Leffel NP with CPAP download.  Referral to weight loss clinic

## 2018-03-15 NOTE — Assessment & Plan Note (Signed)
Cont w/ trach care  follow up trach clinic today as planned

## 2018-03-15 NOTE — Progress Notes (Signed)
Reviewed and agree with assessment/plan.   Chesley Mires, MD [facility] Pulmonary/Critical Care 08/18/2016, 12:24 PM Pager:  [phone]

## 2018-03-17 LAB — CULTURE, RESPIRATORY W GRAM STAIN: Culture: NORMAL

## 2018-03-23 ENCOUNTER — Ambulatory Visit: Payer: Self-pay

## 2018-03-23 NOTE — Progress Notes (Deleted)
Patient ID: [name] [name]                 DOB: 05-16-1981                      MRN: [id number]     HPI: [name] [name] is a 37 y.o. female patient referred by Dr. Marlou Porch to HTN clinic. PMH is significant for HTN, OSA, CHF (EF 50-55%), CKD, obesity, gestational DM. She was admitted in 10/17 respiratory failure in the setting of HTN emergency with pulmonary edema and OHS/OSA requiring intubation. She ultimately underwent tracheostomy due to prolonged respiratory failure. She was admitted in 1/18 with acute renal failure in the setting of dehydration. She has been followed in our hypertension clinic to help with control of blood pressure. She has been seen in HTN clinic since April 2018. Her pressures have remained essentially unchanged since that time with the addition and titration of several medications.  She presents today for follow up and additional medication titration. She denies SOB, chest pain, head aches, and dizziness. May have missed one dose of hydralazine/isosorbide since last visit.   Current HTN meds:  Amlodipine 42m daily in the morning Furosemide 441mdaily Hydralazine 10026mID  Isosorbide dinitrate 70m73mD Lisinopril 40mg43mly in the morning  Carvedilol 25mg 51m Previously tried: labetalol - BP not well controlled.   BP goal: <130/80 mmHg    Family History: The patient's family history includes Birth defects in her paternal grandfather; Diabetes in her other; Hypertension in her mother.  Social History: Denies tobacco products and alcohol  Diet: Eat most of her meals prepared from home. She limits salt. No coffee or sodas. Occasional tea intake not daily. Mostly drinks water or cranberry juice.   Exercise: Walks (generally 20-30 minutes) 2-3 times per week.   Home BP readings: she is unsure if home cuff measures appropriately due to fit.   Wt Readings from Last 3 Encounters:  03/15/18 (!) 389 lb (176.4 kg)  11/10/17 (!) 388 lb 12.8 oz (176.4 kg)    07/28/17 (!) 398 lb 12.8 oz (180.9 kg)   BP Readings from Last 3 Encounters:  03/15/18 124/84  11/10/17 140/88  11/10/17 (!) 144/86   Pulse Readings from Last 3 Encounters:  03/15/18 90  11/10/17 89  11/10/17 87    Renal function: CrCl cannot be calculated (Patient's most recent lab result is older than the maximum 21 days allowed.).  Past Medical History:  Diagnosis Date  . Chronic diastolic heart failure (HCC)  [place] EF 50-55 // b. Echo 10/17: EF 50-55, mild BAE, PASP 42 // Echo 10/18: Mild concentric LVH, EF 55-60, normal wall motion, mild MAC, severe LAE, mild TR, PASP 15, trivial pericardial effusion  . CKD (chronic kidney disease) stage 4, GFR 15-29 ml/min (HCC) 10/25/2016   admitted in 2/18 with SCr of 10 in the setting of dehydration from illness, etc >> improved to 5 at DC  . DM2 (diabetes mellitus, type 2) (HCC)    A1c 7.2 in 05/2016  . Gestational diabetes mellitus in pregnancy 05/24/2013  . HTN in pregnancy, chronic 05/24/2013  . Hypertensive heart disease with CHF (congestive heart failure) (HCC) 1[place]/2017  . Morbid obesity (HCC)  [place] 07/09/[place]   s/p Trach 2/2 prolonged respiratory failure during admx for CHF in 05/2016  . Pulmonary hypertension (HCC)  [place] CH[place]/OSA  . Sinus tachycardia     Current  Outpatient Medications on File Prior to Visit  Medication Sig Dispense Refill  . amLODipine (NORVASC) 10 MG tablet Take 1 tablet (10 mg total) by mouth daily. 90 tablet 1  . carvedilol (COREG) 25 MG tablet Take 1 tablet (25 mg total) by mouth 2 (two) times daily. 180 tablet 3  . fluticasone (FLONASE) 50 MCG/ACT nasal spray Place 2 sprays into both nostrils 2 (two) times daily. 16 g 2  . furosemide (LASIX) 40 MG tablet TAKE 1 TABLET BY MOUTH EVERY DAY 30 tablet 3  . hydrALAZINE (APRESOLINE) 100 MG tablet TAKE 1 TABLET (100 MG TOTAL) BY MOUTH 3 (THREE) TIMES DAILY. 270 tablet 1  . isosorbide dinitrate (ISORDIL) 30 MG  tablet TAKE 1 TABLET BY MOUTH 3 TIMES A DAY 90 tablet 11  . lisinopril (PRINIVIL,ZESTRIL) 40 MG tablet Take 1 tablet (40 mg total) by mouth daily. 90 tablet 3  . nystatin ointment (MYCOSTATIN) Apply topically 2 (two) times daily. (Patient not taking: Reported on 03/15/2018) 30 g 0   No current facility-administered medications on file prior to visit.     Allergies  Allergen Reactions  . No Known Allergies     unknown if currently breastfeeding. Pt is not currently breast feeding and is not planning to become pregnant  Assessment/Plan: Hypertension: BP is still elevated above goal today. BMET today after dose increase of lisinopril. Will increase amlodipine to 26m daily. Follow up in HTN clinic in 4-6 weeks.    Thank you, KLelan Pons APatterson Hamm[facility]  03/23/2018 10:23 AM

## 2018-03-28 ENCOUNTER — Telehealth: Payer: Self-pay | Admitting: Adult Health

## 2018-03-28 NOTE — Telephone Encounter (Signed)
Pt calling to see if we have received any disability paperwork from her lawyer.  Per JJ and TP, no disability paperwork has been received.   Pt is requesting for TP to type a letter stating her medical conditions and why she would need disability.  Requests that the letter be mailed to herself at the below address: [address] [place] [place] [address]   TP please advise on letter.  Thanks!

## 2018-03-29 ENCOUNTER — Encounter: Payer: Self-pay | Admitting: *Deleted

## 2018-03-29 NOTE — Telephone Encounter (Signed)
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  [name] [name] has several co-morbidities including Hypertension and Heart Failure.  Please consider her for disability.  Thank you for you consideration in this matter.

## 2018-03-29 NOTE — Telephone Encounter (Signed)
Letter done and placed in outgoing mail  Pt aware

## 2018-04-13 ENCOUNTER — Ambulatory Visit: Payer: Self-pay | Admitting: Adult Health

## 2018-04-27 ENCOUNTER — Ambulatory Visit: Payer: Self-pay | Admitting: Adult Health

## 2018-05-03 ENCOUNTER — Ambulatory Visit (HOSPITAL_COMMUNITY): Payer: Self-pay

## 2018-05-10 ENCOUNTER — Ambulatory Visit (HOSPITAL_COMMUNITY)
Admission: RE | Admit: 2018-05-10 | Discharge: 2018-05-10 | Disposition: A | Discharge status: Home / Self Care | Payer: Medicaid Other | Source: Ambulatory Visit | Attending: Acute Care | Admitting: Acute Care

## 2018-05-10 DIAGNOSIS — Z930 Tracheostomy status: Secondary | ICD-10-CM | POA: Diagnosis present

## 2018-05-10 DIAGNOSIS — G4733 Obstructive sleep apnea (adult) (pediatric): Secondary | ICD-10-CM | POA: Insufficient documentation

## 2018-05-10 DIAGNOSIS — Z4682 Encounter for fitting and adjustment of non-vascular catheter: Secondary | ICD-10-CM | POA: Insufficient documentation

## 2018-05-10 NOTE — Progress Notes (Signed)
Tracheostomy Procedure Note  [name] [name] [id number] [birth year]  Pre Procedure Tracheostomy Information  Trach Brand: Bivona Size: 5.0 Style: Uncuffed Secured by: Velcro   Procedure: trach change    Post Procedure Tracheostomy Information  Trach Brand: Bivona Size: 5.0 Style: Uncuffed Secured by: Velcro   Post Procedure Evaluation:  ETCO2 positive color change from yellow to purple : Yes.   Vital signs: pulse 87, respirations 24 and pulse oximetry 95 % Patients current condition: stable Complications: No apparent complications Trach site exam: clean, dry Wound care done: dry and 4 x 4 gauze Patient did tolerate procedure well.   Education: none  Prescription needs: none    Additional needs: none

## 2018-05-10 NOTE — Progress Notes (Signed)
[facility]   Reason for visit:  Routine tracheostomy change HPI:  37 year old African-American female well-known to me.  Follows her for tracheostomy management.  She returns to the tracheostomy clinic today for routine tracheostomy change.  Reports no new issues, denies headache, nasal discharge, sore throat, worsening shortness of breath or cough. ROS  General: No fever, chills, headache, sore throat.  HEENT: No tracheal drainage, no pain, no cough. Pulmonary: No shortness of breath, no chest pain, no wheezing.  Cardiac: No chest pain, no orthopnea.  No swelling.  Extremities: No focal weakness or swelling.  Abdomen: No nausea vomiting diarrhea GU: No dysuria hesitancy or frequency neuro: No headaches, gait disturbance, memory loss. Vital signs:  Reviewed, pulse oximetry 95% on room air respirations comfortable at 22 breaths/min Exam:  General: This a pleasant 37 year old female patient who is ambulatory into the tracheostomy clinic HEENT normocephalic atraumatic no jugular venous distention she has a size 5 Bivona tracheostomy in place this is capped with an occlusive red valve her phonation is excellent the tracheostomy stoma is unremarkable Pulmonary: Clear to auscultation no accessory use Cardiac: Regular rate and rhythm Abdomen: Soft nontender Extremities: No significant edema warm dry Neuro: Awake oriented no focal deficits Trach change/procedure: The patient's existing size 5 Bivona tracheostomy was removed, tracheostomy site was inspected and found to be unremarkable without drainage, pain, or exudate.  She was localized with 1% lidocaine viscous jelly, then a size 5 Bivona tracheostomy was easily replaced without difficulty and tidal CO2 checked patient tolerated well.      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  As my previous notes discussed she is  ready for decannulation as soon as she can demonstrate tolerance of CPAP. Plan  See her in 5 weeks for routine tracheostomy change     Visit time: 31 minutes minutes.   Erick Colace ACNP-BC [place]

## 2018-05-16 ENCOUNTER — Ambulatory Visit: Payer: Self-pay | Admitting: Adult Health

## 2018-05-18 ENCOUNTER — Encounter (INDEPENDENT_AMBULATORY_CARE_PROVIDER_SITE_OTHER): Payer: Medicaid Other

## 2018-05-19 ENCOUNTER — Other Ambulatory Visit: Payer: Self-pay | Admitting: Cardiology

## 2018-05-21 ENCOUNTER — Other Ambulatory Visit: Payer: Self-pay | Admitting: Physician Assistant

## 2018-05-21 ENCOUNTER — Other Ambulatory Visit: Payer: Self-pay | Admitting: Cardiology

## 2018-05-22 ENCOUNTER — Other Ambulatory Visit: Payer: Self-pay | Admitting: Cardiology

## 2018-05-22 DIAGNOSIS — I10 Essential (primary) hypertension: Secondary | ICD-10-CM

## 2018-05-22 DIAGNOSIS — I5032 Chronic diastolic (congestive) heart failure: Secondary | ICD-10-CM

## 2018-05-22 MED ORDER — ISOSORBIDE DINITRATE 30 MG PO TABS: 30.0000 mg | ORAL_TABLET | Freq: Three times a day (TID) | ORAL | 2 refills | Status: DC

## 2018-05-22 MED ORDER — FUROSEMIDE 40 MG PO TABS: 40.0000 mg | ORAL_TABLET | Freq: Every day | ORAL | 2 refills | Status: DC

## 2018-05-30 IMAGING — CT CT ANGIO CHEST
2 of 6 series · 18 of 36 positions shown · IV contrast (Omni 300)
Comparison: Chest radiograph May 25, 2016

CLINICAL DATA: Shortness of Breath and elevated D-dimer

EXAM:
CT ANGIOGRAPHY CHEST WITH CONTRAST
TECHNIQUE: Multidetector CT imaging of the chest was performed using the
standard protocol during bolus administration of intravenous
contrast. Multiplanar CT image reconstructions and MIPs were
obtained to evaluate the vascular anatomy.
CONTRAST:  80 mL Isovue 370 nonionic

[Series 6: pe thins · axial · 0.81mm/px · z∈[+611,+892]mm · 17 of 443 slices shown]
[im 21/443  lung]
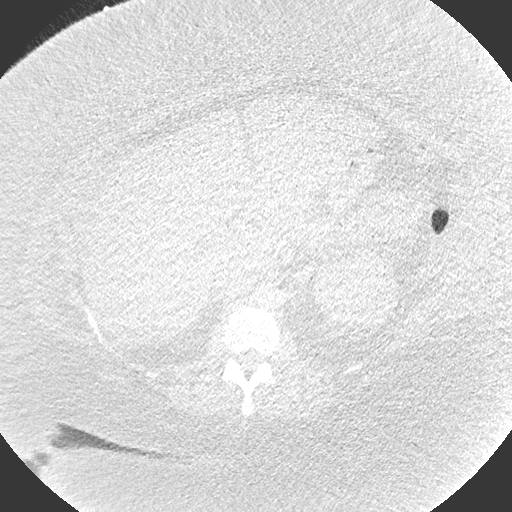
[im 41/443  mediastinal]
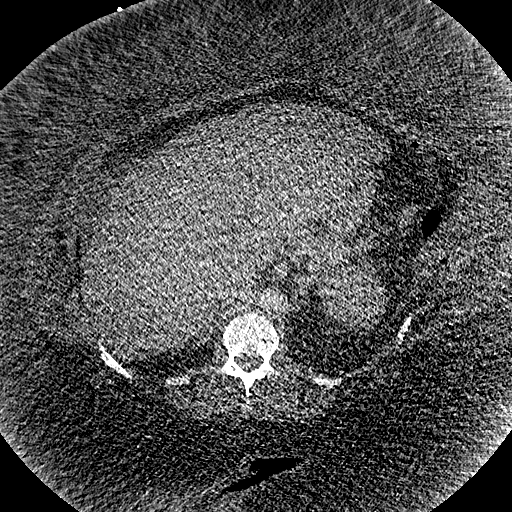
[im 81/443  lung]
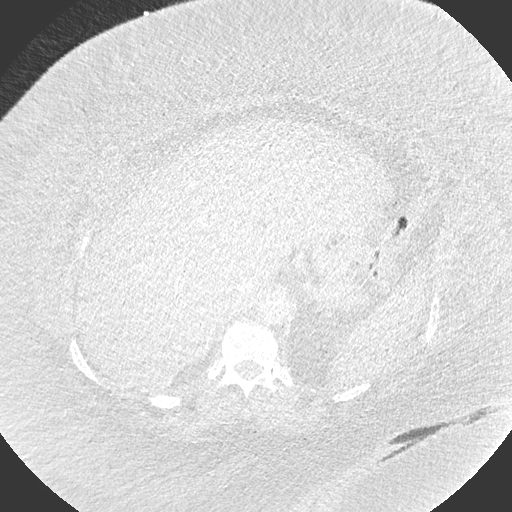
[im 101/443  mediastinal]
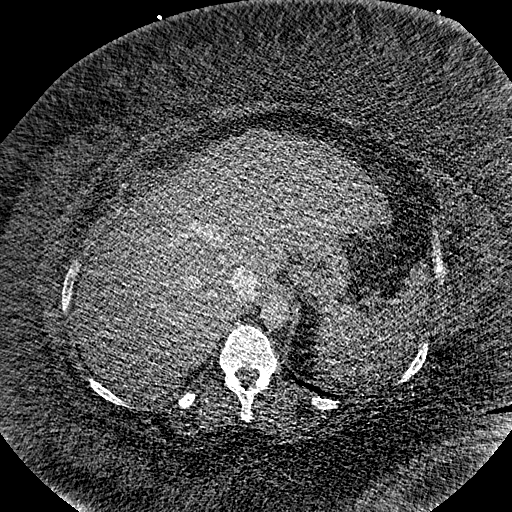
[im 121/443  lung]
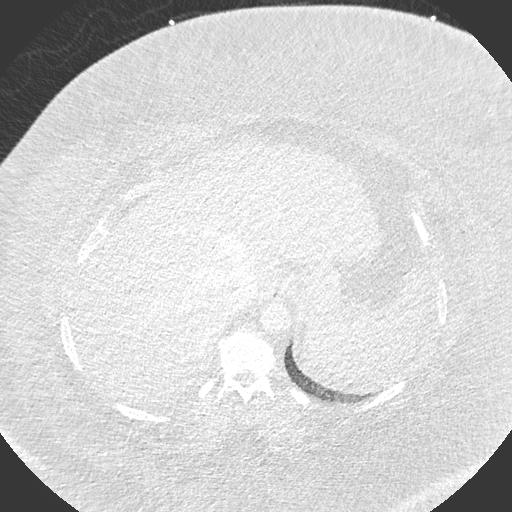
[im 141/443  mediastinal]
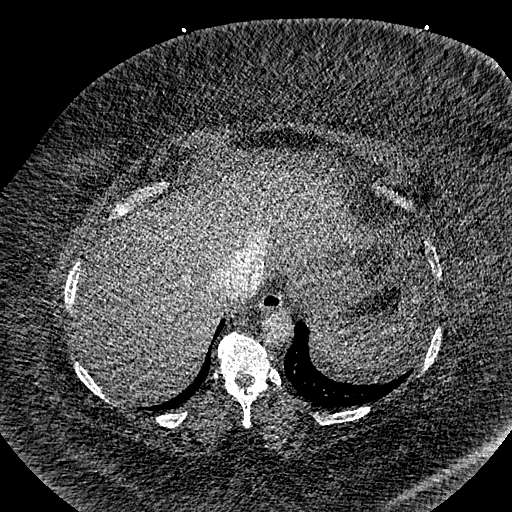
[im 181/443  lung]
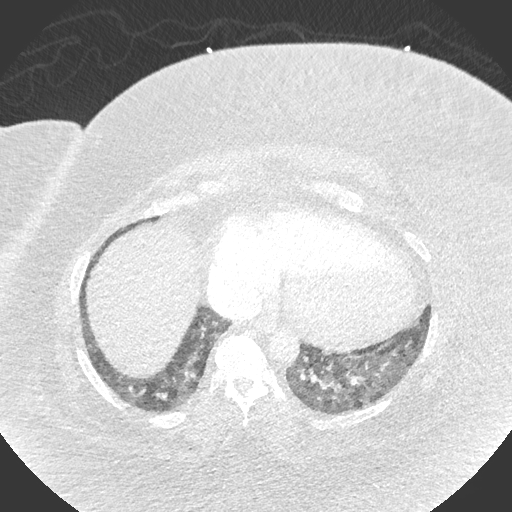
[im 201/443  mediastinal]
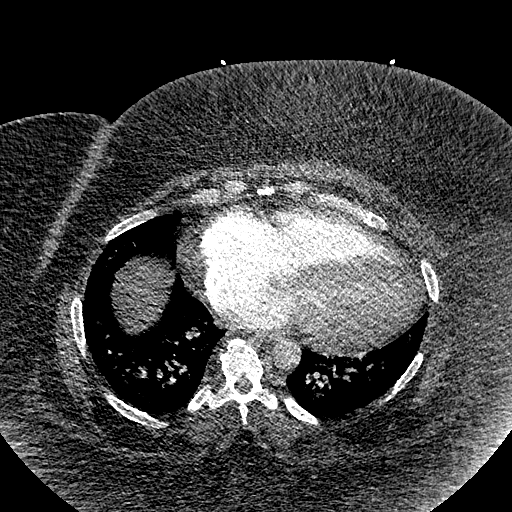
[im 222/443  lung]
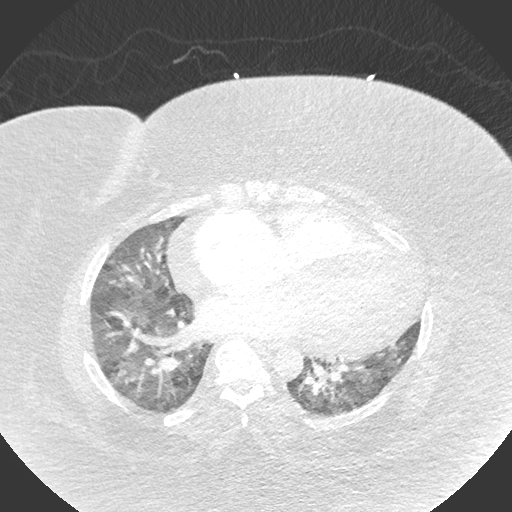
[im 242/443  mediastinal]
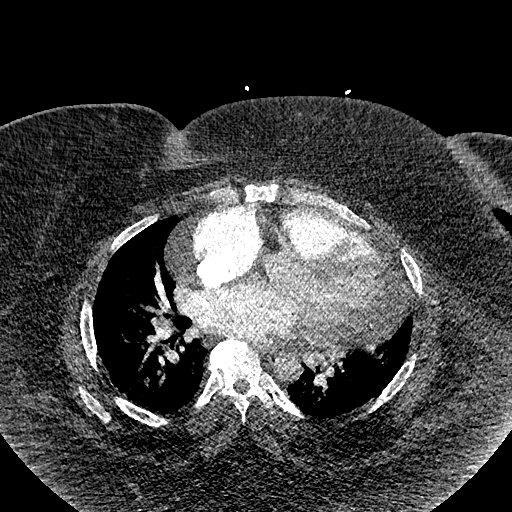
[im 262/443  lung]
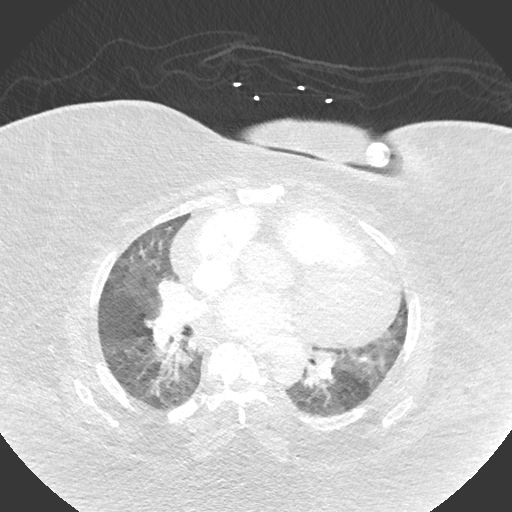
[im 302/443  mediastinal]
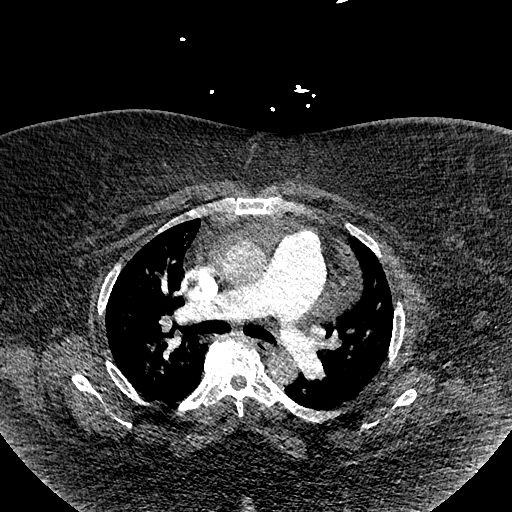
[im 322/443  lung]
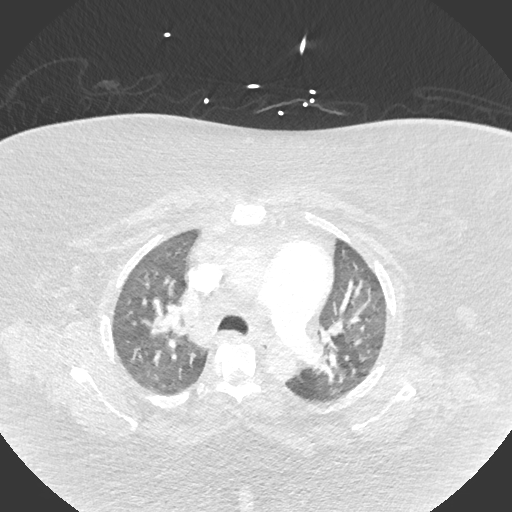
[im 342/443  mediastinal]
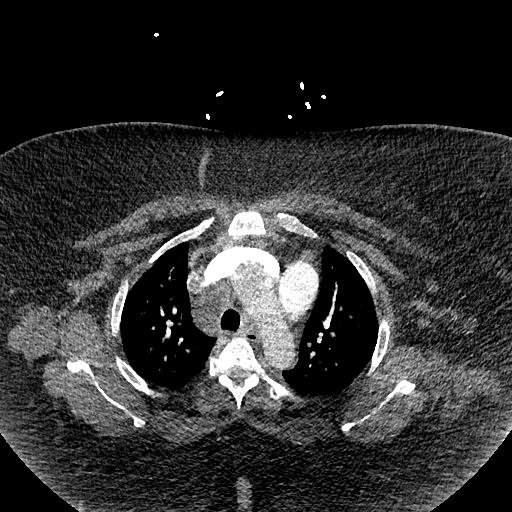
[im 362/443  lung]
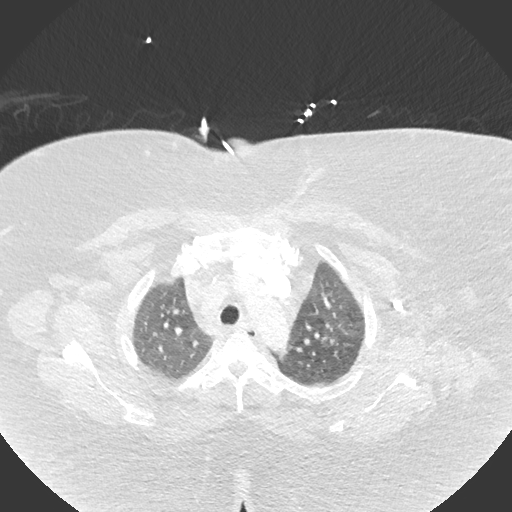
[im 402/443  mediastinal]
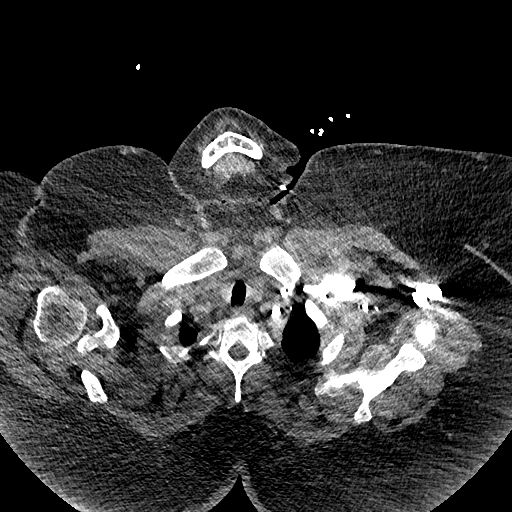
[im 422/443  lung]
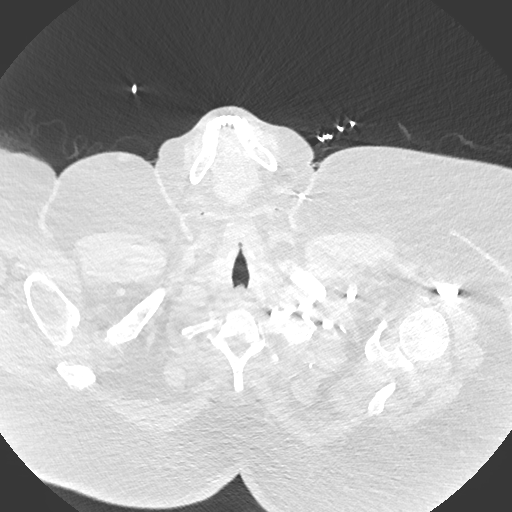

[Series 7: pe 2mm cor · coronal · 0.61mm/px · 1 of 109 slices shown]
[im 55/109  mediastinal]
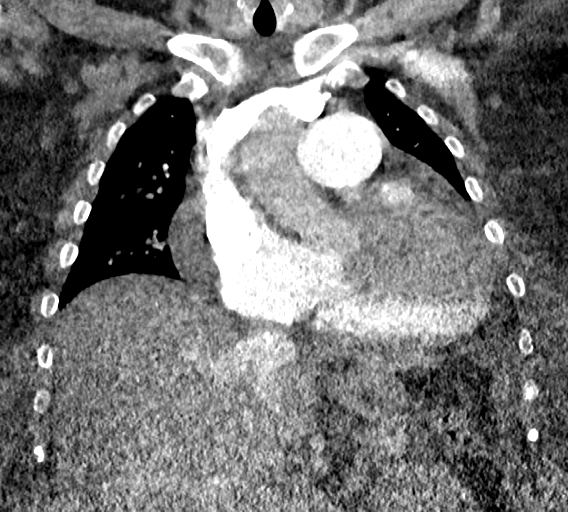

[18 of 36 positions shown; findings below may reference images not displayed]

FINDINGS: Cardiovascular: There is no central vessel pulmonary embolus. Due to
attenuation artifact due to the patient's large size, assessment of
the peripheral pulmonary arterial vessels is less than optimal.
There is no obvious pulmonary embolus more distally allowing for a
degree of attenuation artifact which is symmetric bilaterally. There
is no demonstrable thoracic aortic aneurysm or dissection. The main
pulmonary outflow tract measures 4.9 cm which is felt to be
compatible with a degree of pulmonary arterial hypertension.
Visualized great vessels appear unremarkable. There is cardiomegaly.
There is pericardial fluid along the right side of the heart
laterally.

Mediastinum/Nodes: Thyroid appears unremarkable. There is no
appreciable thoracic adenopathy.

Lungs/Pleura: There is pulmonary edema throughout the lungs
bilaterally, most pronounced in the perihilar and lower lobe regions
but also moderate in the inferior lingula and right middle lobe
regions. There is no well-defined airspace consolidation. There are
no appreciable pleural effusions.

Upper Abdomen: There is reflux of contrast into the inferior vena
cava and pulmonary veins. Visualized upper abdominal structures
otherwise appear unremarkable.

Musculoskeletal: There is degenerative change in the thoracic spine
with diffuse idiopathic skeletal hyperostosis. No blastic or lytic
bone lesions are evident.

Review of the MIP images confirms the above findings.
IMPRESSION: No pulmonary embolus demonstrable. Visualization of the peripheral
pulmonary arteries is less than optimal due to attenuation artifact
from patient's large size.

Main pulmonary outflow tract enlargement consistent with pulmonary
arterial hypertension.

Cardiomegaly with pulmonary edema consistent with congestive heart
failure. Small pericardial effusion in the right heart region
laterally. No pleural effusions evident.

No adenopathy.

Reflux of contrast in the inferior vena cava and hepatic veins is
felt to be indicative of increased right heart pressure.

Diffuse idiopathic skeletal hyperostosis in the thoracic spine.

## 2018-05-31 ENCOUNTER — Ambulatory Visit: Payer: Self-pay | Admitting: Primary Care

## 2018-05-31 ENCOUNTER — Ambulatory Visit (INDEPENDENT_AMBULATORY_CARE_PROVIDER_SITE_OTHER): Payer: Self-pay | Admitting: Family Medicine

## 2018-05-31 NOTE — Progress Notes (Deleted)
_0  ID: [name] [name], female    DOB: Mar 15, 1981, 37 y.o.   MRN: [id number]  No chief complaint on file.   Referring provider: Flossie Buffy, NP  HPI: 37 year old female, never smoked. PMH obesity hypoventilation syndrome, OSA, pneumonitis, HCAP, chronic respiratory failure with hypercapnia, pulmonary hypertension, chronic diastolic heart failure, hypertension. Patient of Dr. Halford Chessman, previously seen by Dr. [name]. Following with pulmonary Nps, last OV 03/15/18. She was diagnosed with sleep apnea October 2012 with an AHI at 75. Hospital admission October 2017 with critical illness requiring tracheostomy.  11/16/2016 sleep study showed severe sleep apnea, AHI 59, optimal C Pap pressure was 12 cm of H2O (done with capped trach ) .   03/15/2018 Follow up ; OSA , Trach  Patient returns for a 82-monthfollow-up.  Patient has underlying severe sleep apnea.  She has a chronic trach.  She says that she has been doing well with her trach.  She continues to want to have this removed.  She is wearing CPAP at bedtime.  She Her trach capped during this time.  Download does not show great compliance.  With only using th about 7 days out of the last 30 where she wore more than 4 hours.  When she does wear it her apnea is well controlled with AHI 0.4.  Her average usage was around 3.5 hours.  She is on auto CPAP 5-15 summers H2O. We discussed the importance of compliance. We also discussed weight loss.  She would like referral to the weight loss clinic. She continues to follow-up at the trach clinic.  She denies any redness or drainage.  Patient is aware to sleep only with her CPAP with a capped trach.  Or to use her trach open with trach collar at 2 L of oxygen.  She would like to be decannulated.  However we have talked extensively about her CPAP compliance and she would need to document significant compliance over 90-day.  In order to consider decannulation.   05/31/2018 Patient presents today  for 3 month follow-up for severe OSA. Follows with trach clinic.      Allergies  Allergen Reactions  . No Known Allergies     Immunization History  Administered Date(s) Administered  . Influenza,inj,Quad PF,6+ Mos 06/30/2016    Past Medical History:  Diagnosis Date  . Chronic diastolic heart failure ([facility]    Echo 10/12: EF 50-55 // b. Echo 10/17: EF 50-55, mild BAE, PASP 42 // Echo 10/18: Mild concentric LVH, EF 55-60, normal wall motion, mild MAC, severe LAE, mild TR, PASP 15, trivial pericardial effusion  . CKD (chronic kidney disease) stage 4, GFR 15-29 ml/min (HCC) 10/25/2016   admitted in 2/18 with SCr of 10 in the setting of dehydration from illness, etc >> improved to 5 at DC  . DM2 (diabetes mellitus, type 2) (HCC)    A1c 7.2 in 05/2016  . Gestational diabetes mellitus in pregnancy 05/24/2013  . HTN in pregnancy, chronic 05/24/2013  . Hypertensive heart disease with CHF (congestive heart failure) ([place] 05/25/2016  . Morbid obesity ([facility]   . OSA (obstructive sleep apnea) 07/09/2011   s/p Trach 2/2 prolonged respiratory failure during admx for CHF in 05/2016  . Pulmonary hypertension ([facility]    secondary from CHF/OSA  . Sinus tachycardia     Tobacco History: Social History   Tobacco Use  Smoking Status Never Smoker  Smokeless Tobacco Never Used   Counseling given: Not Answered   Outpatient Medications Prior  to Visit  Medication Sig Dispense Refill  . amLODipine (NORVASC) 10 MG tablet Take 1 tablet (10 mg total) by mouth daily. Please make yearly appt with Dr. Marlou Porch for December for future refills. 1st attempt 90 tablet 0  . carvedilol (COREG) 25 MG tablet Take 1 tablet (25 mg total) by mouth 2 (two) times daily. 180 tablet 3  . fluticasone (FLONASE) 50 MCG/ACT nasal spray Place 2 sprays into both nostrils 2 (two) times daily. 16 g 2  . furosemide (LASIX) 40 MG tablet Take 1 tablet (40 mg total) by mouth daily. Please make yearly appt with Dr. Marlou Porch for December.  1st attempt 30 tablet 2  . hydrALAZINE (APRESOLINE) 100 MG tablet TAKE 1 TABLET (100 MG TOTAL) BY MOUTH 3 (THREE) TIMES DAILY. 270 tablet 1  . isosorbide dinitrate (ISORDIL) 30 MG tablet Take 1 tablet (30 mg total) by mouth 3 (three) times daily. Please make yearly appt with Dr. Marlou Porch for December. 1st attempt 90 tablet 2  . lisinopril (PRINIVIL,ZESTRIL) 40 MG tablet Take 1 tablet (40 mg total) by mouth daily. 90 tablet 3  . nystatin ointment (MYCOSTATIN) Apply topically 2 (two) times daily. (Patient not taking: Reported on 03/15/2018) 30 g 0   No facility-administered medications prior to visit.       Review of Systems  Review of Systems   Physical Exam  There were no vitals taken for this visit. Physical Exam   Lab Results:  CBC    Component Value Date/Time   WBC 8.4 09/20/2016 0604   RBC 3.01 (L) 09/20/2016 0604   HGB 9.1 (L) 09/20/2016 0604   HCT 27.6 (L) 09/20/2016 0604   PLT 469 (H) 09/20/2016 0604   MCV 91.7 09/20/2016 0604   MCH 30.2 09/20/2016 0604   MCHC 33.0 09/20/2016 0604   RDW 16.0 (H) 09/20/2016 0604   LYMPHSABS 1.2 06/30/2016 0500   MONOABS 0.6 06/30/2016 0500   EOSABS 0.9 (H) 06/30/2016 0500   BASOSABS 0.1 06/30/2016 0500    BMET    Component Value Date/Time   NA 140 11/10/2017 0901   K 4.4 11/10/2017 0901   CL 98 11/10/2017 0901   CO2 29 11/10/2017 0901   GLUCOSE 104 (H) 11/10/2017 0901   GLUCOSE 91 09/23/2016 0408   BUN 22 (H) 11/10/2017 0901   CREATININE 1.28 (H) 11/10/2017 0901   CALCIUM 9.2 11/10/2017 0901   GFRNONAA 53 (L) 11/10/2017 0901   GFRAA 61 11/10/2017 0901    BNP    Component Value Date/Time   BNP 175.1 (H) 09/15/2016 1418    ProBNP No results found for: PROBNP  Imaging: No results found.   Assessment & Plan:   No problem-specific Assessment & Plan notes found for this encounter.     Martyn Ehrich, NP 05/31/2018

## 2018-06-02 IMAGING — CR DG CHEST 1V PORT
2 series · 2 of 2 positions shown · non-contrast
Comparison: Chest radiograph 05/28/2016 at [DATE] a.m.

CLINICAL DATA: Endotracheal tube and orogastric tube placement.

EXAM:
PORTABLE CHEST 1 VIEW

[AP (1 of 2)]
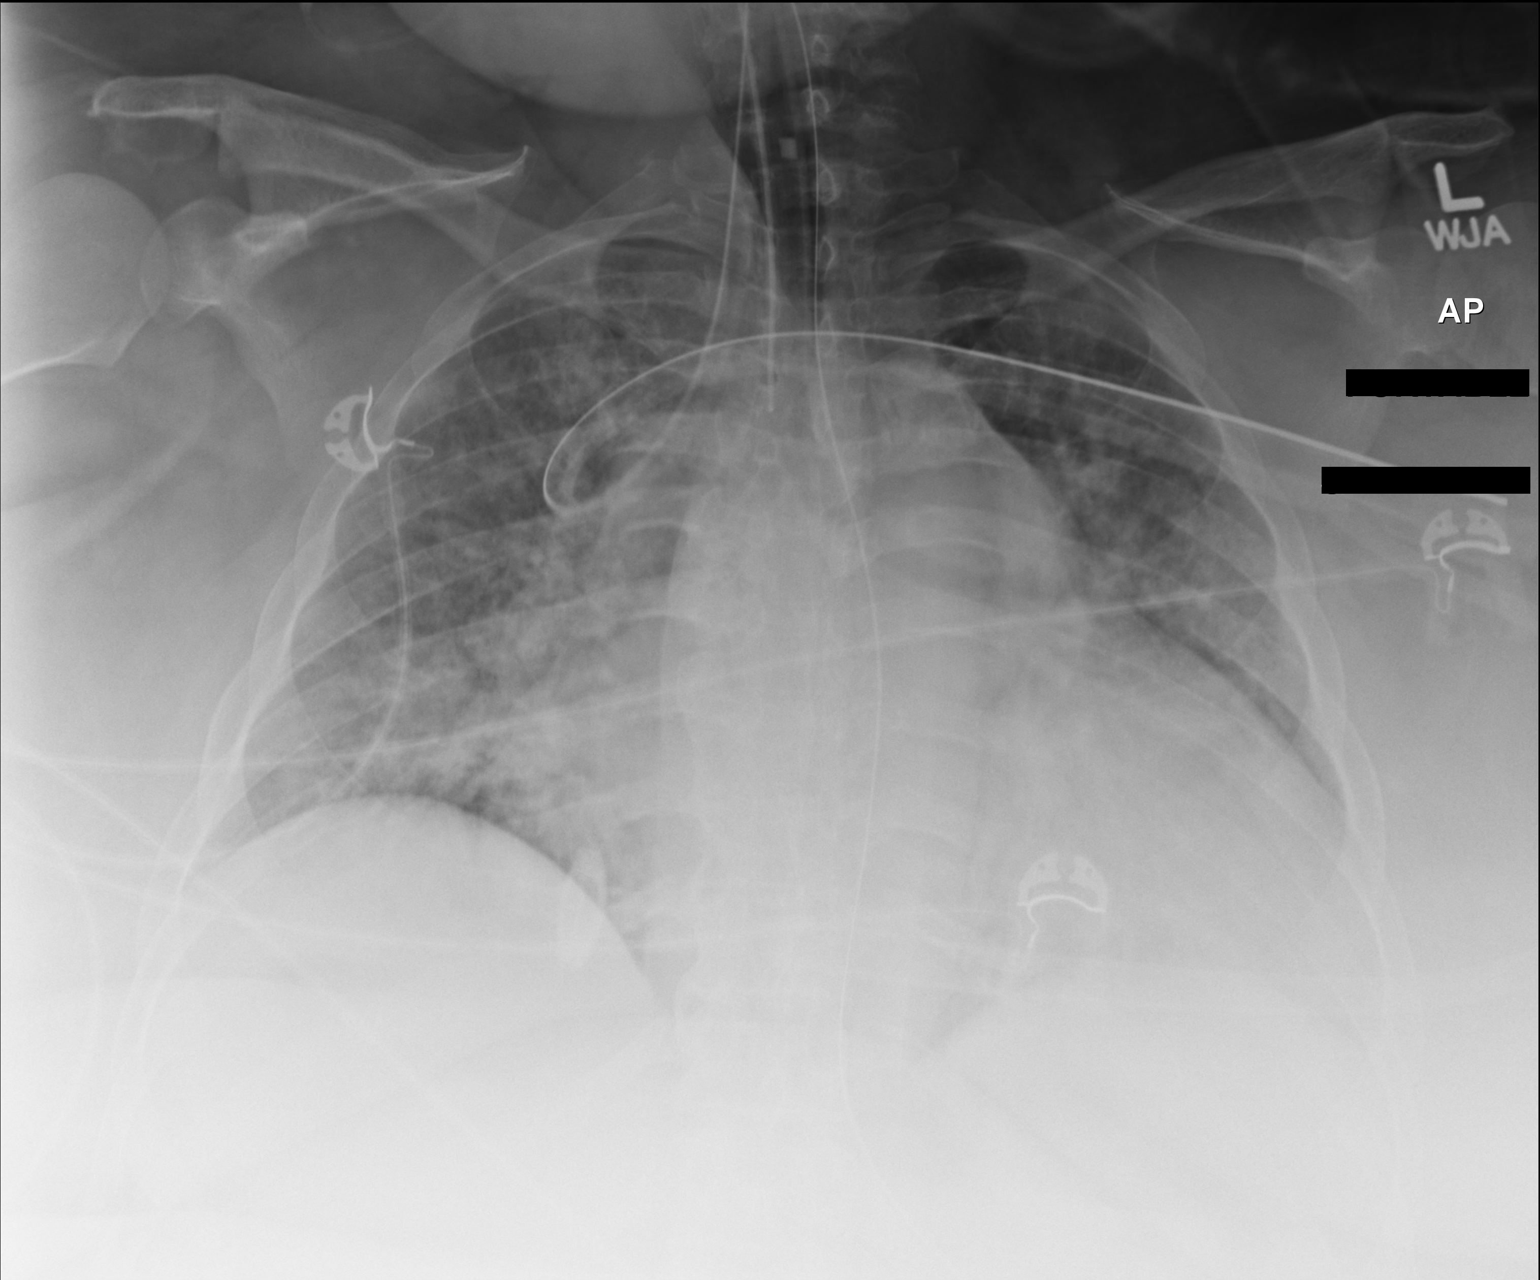

[AP (2 of 2)]
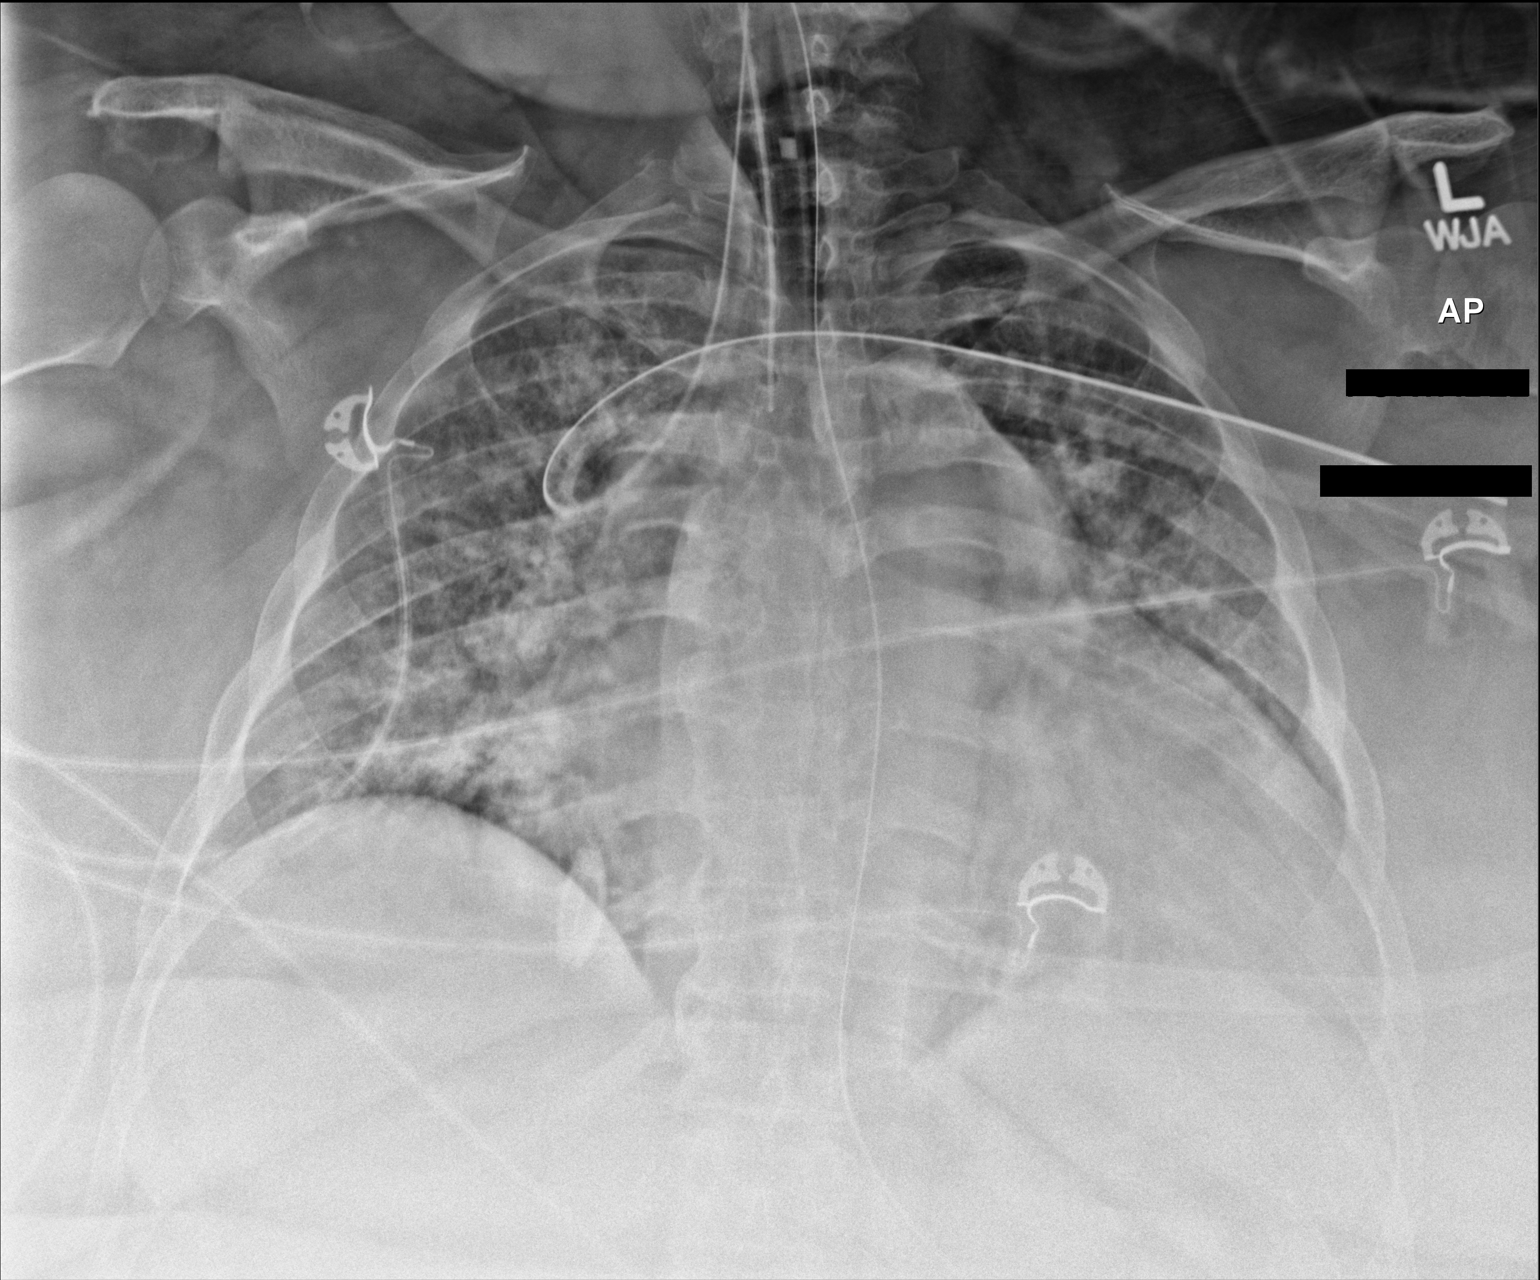

[2 of 2 positions shown; findings below may reference images not displayed]

FINDINGS: Endotracheal tube tip is just below the level of the clavicular
heads, 1.5 cm above the inferior margin of the carina. Orogastric 2
courses below the diaphragm and beyond the field of view.

There is unchanged cardiomegaly with diffuse pulmonary edema. Small
left pleural effusion. No pneumothorax visualized.
IMPRESSION: 1. Endotracheal tube tip just below the level of the clavicular
heads, approximately 1.5 cm above the inferior margin of the carina.
2. Unchanged cardiomegaly and diffuse pulmonary edema.

## 2018-06-03 IMAGING — CR DG CHEST 1V PORT
1 series · 1 of 1 positions shown · non-contrast
Comparison: 05/28/2016

CLINICAL DATA: Acute respiratory failure with hypoxia. On
ventilator.

EXAM:
PORTABLE CHEST 1 VIEW

[AP]
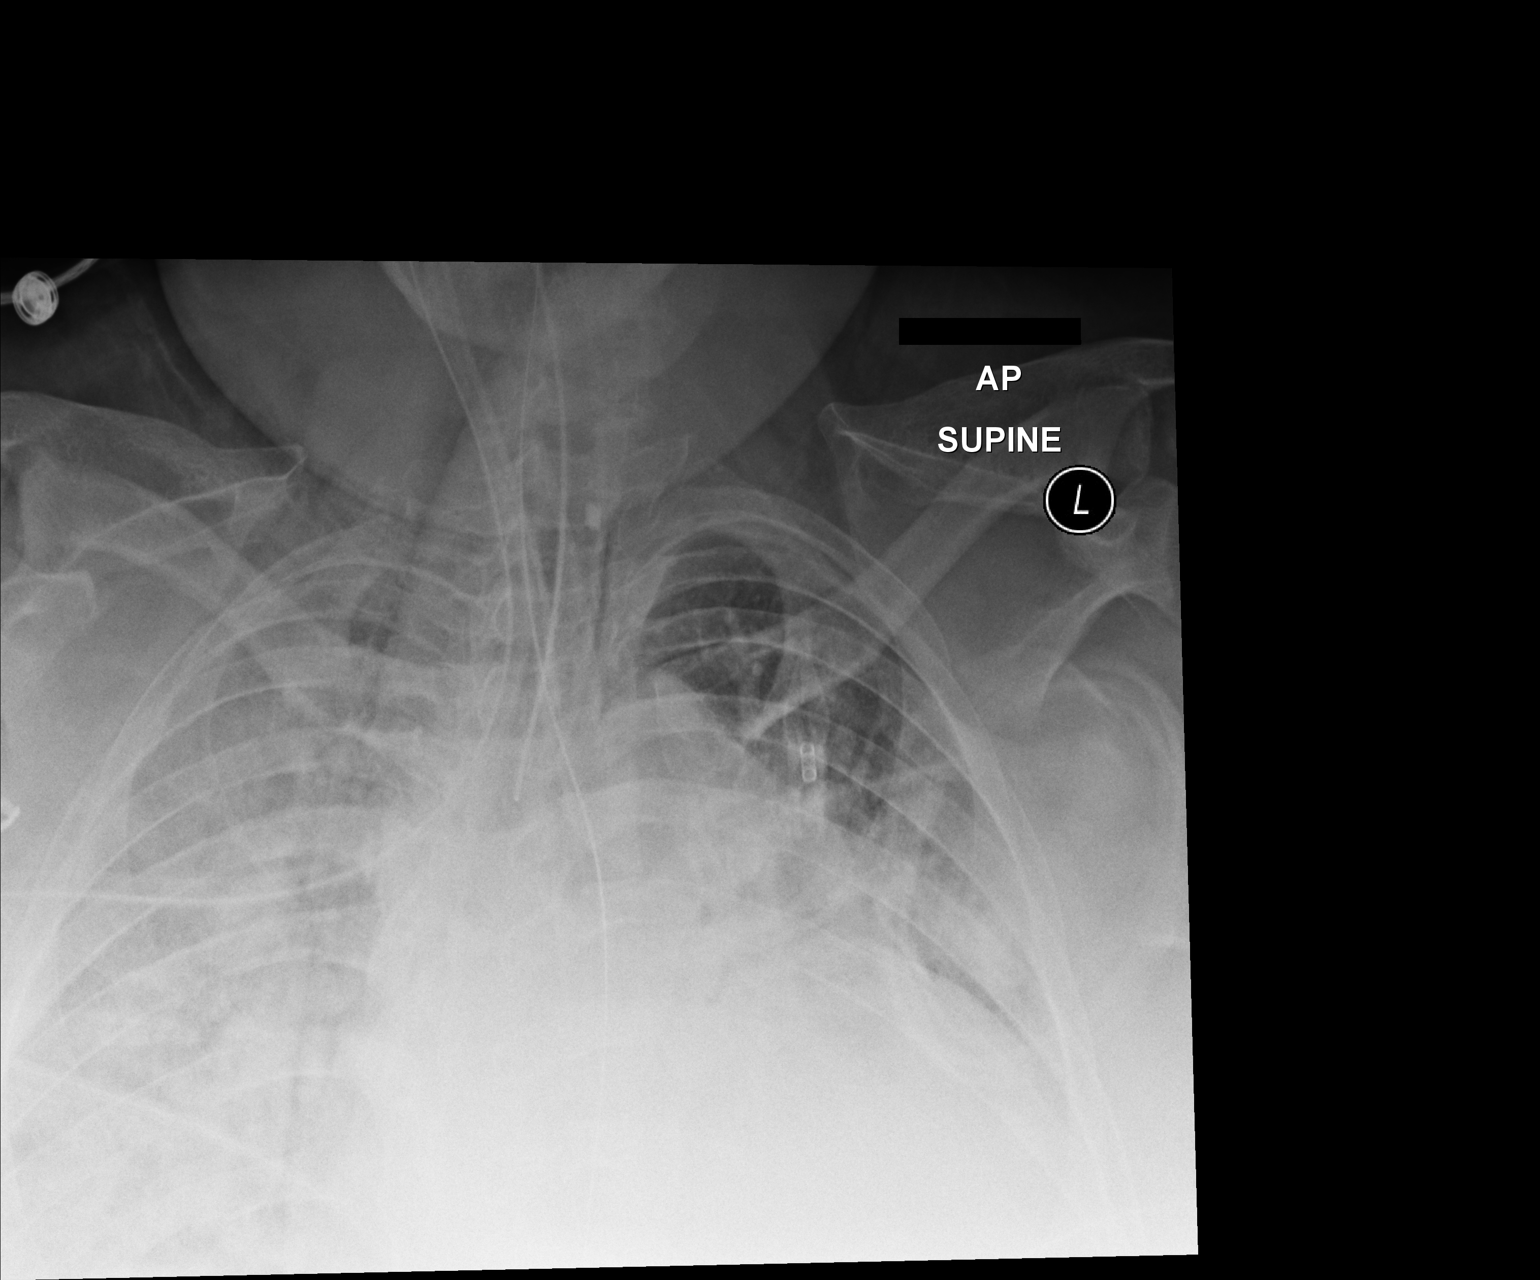

[1 of 1 positions shown; findings below may reference images not displayed]

FINDINGS: Endotracheal tube and nasogastric tube remain in appropriate
position.

Worsening diffuse bilateral pulmonary airspace disease is seen since
previous study. Heart size remains stable. No pneumothorax
visualized.
IMPRESSION: Interval worsening of diffuse bilateral pulmonary airspace disease.

## 2018-06-04 IMAGING — CR DG CHEST 1V PORT
1 series · 1 of 1 positions shown · non-contrast
Comparison: May 29, 2016

CLINICAL DATA: Shortness of Breath

EXAM:
PORTABLE CHEST 1 VIEW

[AP]
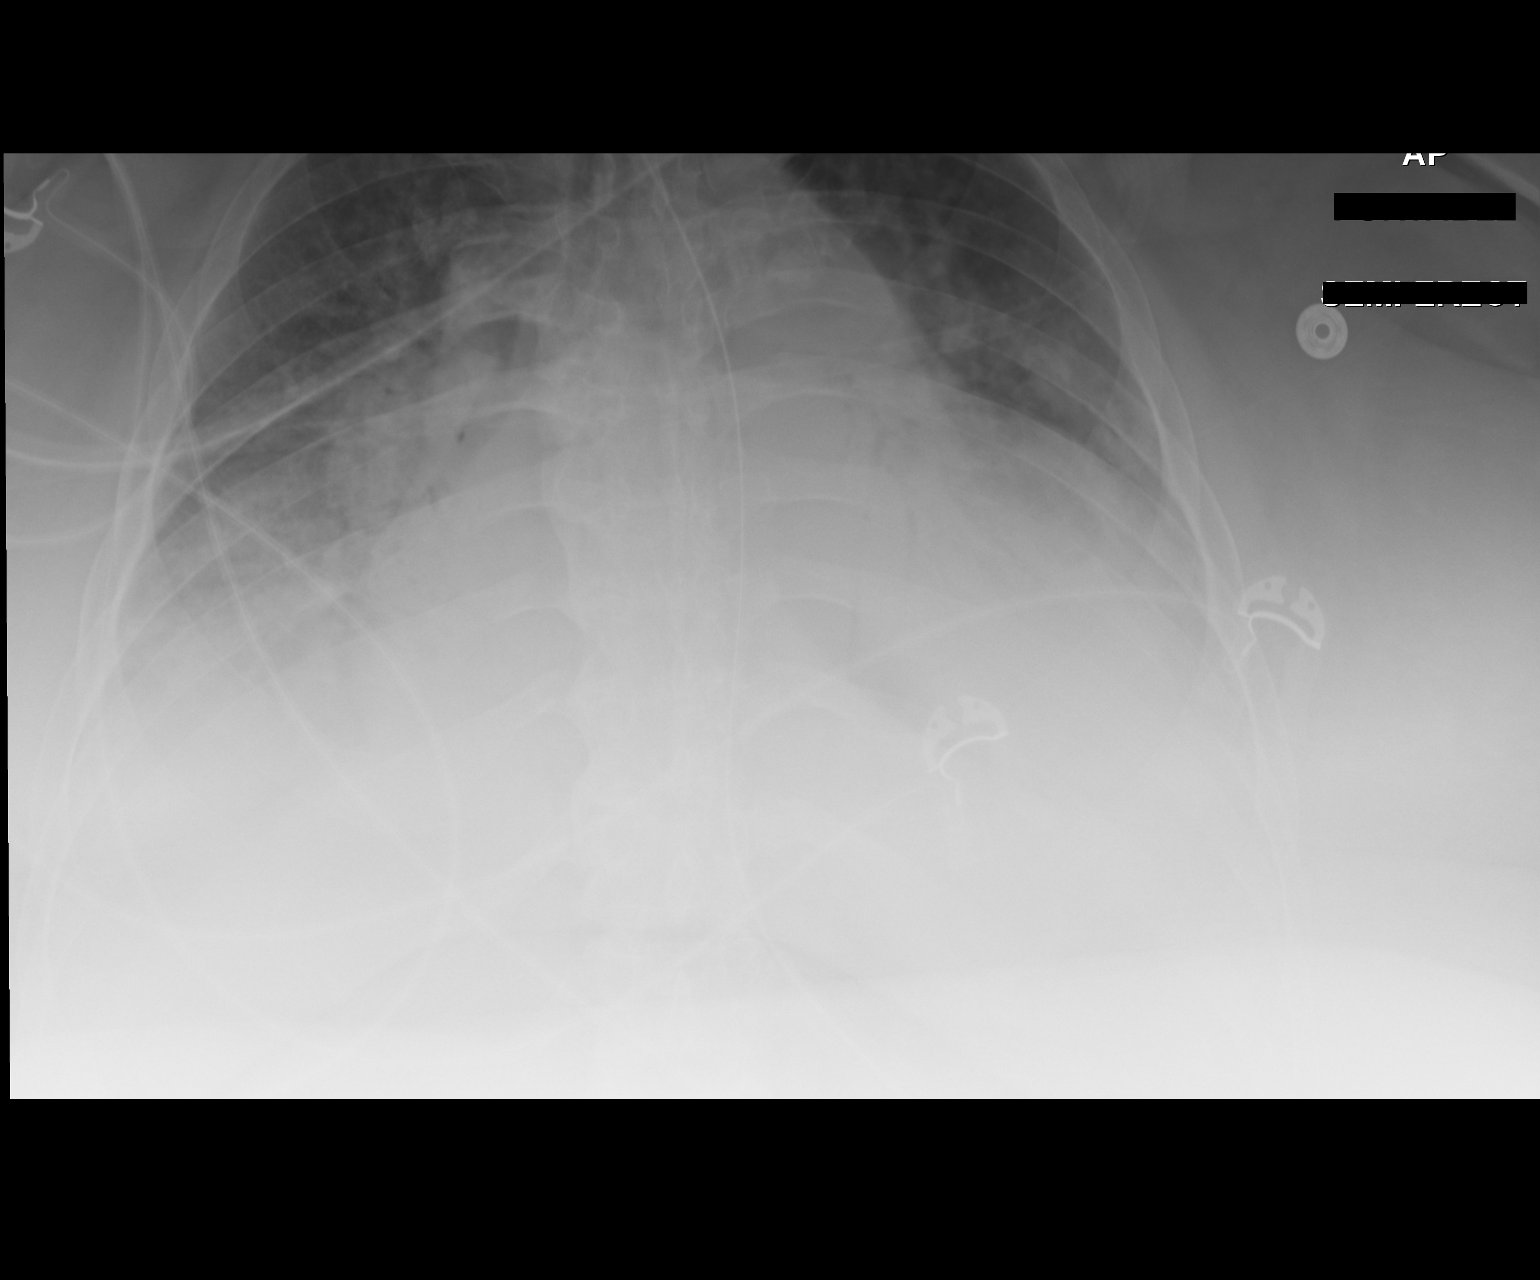

[1 of 1 positions shown; findings below may reference images not displayed]

FINDINGS: Endotracheal tube tip is 3.0 cm above the carina. Nasogastric tube
tip and side port below the diaphragm. No pneumothorax. There is
cardiomegaly with pulmonary venous hypertension. There are bilateral
pleural effusions with patchy interstitial alveolar edema
bilaterally, primarily in the bases. There are air bronchograms in
both lower lobes. No adenopathy evident.
IMPRESSION: Tube positions as described without pneumothorax. Evidence
congestive heart failure. Question superimposed pneumonia in the
bases. The appearance is stable compared to 1 day prior.

## 2018-06-22 ENCOUNTER — Inpatient Hospital Stay (HOSPITAL_COMMUNITY): Admission: RE | Admit: 2018-06-22 | Payer: Self-pay | Source: Ambulatory Visit

## 2018-07-13 ENCOUNTER — Ambulatory Visit (HOSPITAL_COMMUNITY)
Admission: RE | Admit: 2018-07-13 | Discharge: 2018-07-13 | Disposition: A | Discharge status: Home / Self Care | Payer: Medicaid Other | Source: Ambulatory Visit | Attending: Acute Care | Admitting: Acute Care

## 2018-07-13 DIAGNOSIS — G4733 Obstructive sleep apnea (adult) (pediatric): Secondary | ICD-10-CM

## 2018-07-13 DIAGNOSIS — Z930 Tracheostomy status: Secondary | ICD-10-CM | POA: Insufficient documentation

## 2018-07-13 NOTE — Progress Notes (Signed)
[facility]   Reason for visit:  Return visit for trach care  HPI:  37 year old female well known to me. 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 We have been working over a year now on transitioning to CPAP. She still on average only uses the CPAP about 1/2 of the night. The goal is to spend 6h/night on it. She is averaging 3-4. The downloads from the CPAP are being sent to our office. She returns today for routine trach change  ROS  Review of Systems - History obtained from the patient General ROS: negative for - chills, fatigue, fever, malaise or weight gain Psychological ROS: negative ENT ROS: negative Allergy and Immunology ROS: negative Hematological and Lymphatic ROS: negative Endocrine ROS: negative Respiratory ROS: no cough, shortness of breath, or wheezing Cardiovascular ROS: no chest pain or dyspnea on exertion Gastrointestinal ROS: no abdominal pain, change in bowel habits, or black or bloody stools Musculoskeletal ROS: negative Neurological ROS: neg   Vital signs:  HR 102, RR 24 sats 94% Exam:  Physical Exam  Constitutional: She is oriented to person, place, and time. She appears well-developed and well-nourished. No distress.  HENT:  Head: Normocephalic and atraumatic.  Trach stoma was unremarkable   Eyes: Pupils are equal, round, and reactive to light. EOM are normal. Right eye exhibits no discharge. Left eye exhibits no discharge.  Neck: Normal range of motion. Neck supple.  Cardiovascular: Normal rate and regular rhythm.  Pulmonary/Chest: Effort normal and breath sounds normal. No respiratory distress.  Abdominal: Soft. Bowel sounds are normal. She exhibits no distension.  Musculoskeletal: Normal range of motion. She exhibits no edema, tenderness or deformity.  Neurological: She is alert and oriented to person, place, and time.  Skin: Skin is warm and dry. Capillary refill takes less than 2 seconds. She is not diaphoretic.  Psychiatric:  She has a normal mood and affect. Her behavior is normal.    Trach change/procedure: size 5 Bivona removed. Trach stoma inspected and unremarkable. New #5 placed w/out difficulty. ETCO2 verified. Pt tolerated well.       Impression/dx  Trach dependence  Severe OSA   Discussion  Doing well. Still not doing CPAP long enough to take trach out and feel confident that her OSA is being adequately treated.   Plan  ROV 5-6 weeks for change Still working on CPAP compliance.  Her goal is to be ready to do this by the new year. I support this & am hopeful she can commit to it.    Visit time: 32 minutes.   Erick Colace ACNP-BC [place]

## 2018-07-13 NOTE — Progress Notes (Signed)
Tracheostomy Procedure Note  ERCELL PERLMAN [id number] 1981/06/14  Pre Procedure Tracheostomy Information  Trach Brand: Bivona Size: 5.0 Style: Uncuffed Secured by: Velcro   Procedure: trach change    Post Procedure Tracheostomy Information  Trach Brand: Bivona Size: 5.0 Style: uncuffed Secured by: Velcro   Post Procedure Evaluation:  ETCO2 positive color change from yellow to purple : Yes.   Vital signs: pulse 102, respirations 24 and pulse oximetry 94 % Patients current condition: stable Complications: No apparent complications Trach site exam: clean, dry Wound care done: dry, sterile and 4 x 4 gauze Patient did tolerate procedure well.   Education: none  Prescription needs: none    Additional needs: none

## 2018-08-02 ENCOUNTER — Other Ambulatory Visit: Payer: Self-pay | Admitting: Cardiology

## 2018-08-02 NOTE — Telephone Encounter (Signed)
Patient last seen 11/10/17 and at that visit her amlodipine dose was increased to 10 mg and she was instructed to f/u in 4-6 weeks with HTN clinic but she no-showed. Just wasn't sure how long to extend refill. Please advise. Thanks, MI

## 2018-08-22 ENCOUNTER — Other Ambulatory Visit: Payer: Self-pay | Admitting: Cardiology

## 2018-08-22 MED ORDER — FUROSEMIDE 40 MG PO TABS: 40.0000 mg | ORAL_TABLET | Freq: Every day | ORAL | 0 refills | Status: DC

## 2018-08-22 MED ORDER — AMLODIPINE BESYLATE 10 MG PO TABS: 10.0000 mg | ORAL_TABLET | Freq: Every day | ORAL | 0 refills | Status: DC

## 2018-08-22 NOTE — Telephone Encounter (Signed)
*  STAT* If patient is at the pharmacy, call can be transferred to refill team.   1. Which medications need to be refilled? (please list name of each medication and dose if known) amLODipine (NORVASC) 10 MG tablet furosemide (LASIX) 40 MG tablet  2. Which pharmacy/location (including street and city if local pharmacy) is medication to be sent to? [facility]  3. Do they need a 30 day or 90 day supply? [place]

## 2018-08-24 ENCOUNTER — Ambulatory Visit (HOSPITAL_COMMUNITY): Payer: Self-pay

## 2018-08-31 ENCOUNTER — Ambulatory Visit (HOSPITAL_COMMUNITY): Payer: Self-pay

## 2018-09-20 ENCOUNTER — Ambulatory Visit (HOSPITAL_COMMUNITY)
Admission: RE | Admit: 2018-09-20 | Discharge: 2018-09-20 | Disposition: A | Discharge status: Home / Self Care | Payer: Medicaid Other | Source: Ambulatory Visit | Attending: Acute Care | Admitting: Acute Care

## 2018-09-20 ENCOUNTER — Encounter: Payer: Self-pay | Admitting: Nurse Practitioner

## 2018-09-20 ENCOUNTER — Ambulatory Visit: Payer: Medicaid Other | Admitting: Nurse Practitioner

## 2018-09-20 VITALS — BP 160/100 | HR 84 | Ht 65.0 in | Wt >= 6400 oz

## 2018-09-20 DIAGNOSIS — G4733 Obstructive sleep apnea (adult) (pediatric): Secondary | ICD-10-CM

## 2018-09-20 DIAGNOSIS — I110 Hypertensive heart disease with heart failure: Secondary | ICD-10-CM | POA: Diagnosis not present

## 2018-09-20 DIAGNOSIS — I5032 Chronic diastolic (congestive) heart failure: Secondary | ICD-10-CM

## 2018-09-20 DIAGNOSIS — Z430 Encounter for attention to tracheostomy: Secondary | ICD-10-CM | POA: Insufficient documentation

## 2018-09-20 DIAGNOSIS — I272 Pulmonary hypertension, unspecified: Secondary | ICD-10-CM | POA: Diagnosis not present

## 2018-09-20 DIAGNOSIS — Z79899 Other long term (current) drug therapy: Secondary | ICD-10-CM

## 2018-09-20 DIAGNOSIS — I10 Essential (primary) hypertension: Secondary | ICD-10-CM

## 2018-09-20 LAB — TSH: TSH: 2.97 u[IU]/mL (ref 0.450–4.500)

## 2018-09-20 LAB — BASIC METABOLIC PANEL
BUN/Creatinine Ratio: 20 (ref 9–23)
BUN: 24 mg/dL — ABNORMAL HIGH (ref 6–20)
CO2: 27 mmol/L (ref 20–29)
Calcium: 9.5 mg/dL (ref 8.7–10.2)
Chloride: 96 mmol/L (ref 96–106)
Creatinine, Ser: 1.23 mg/dL — ABNORMAL HIGH (ref 0.57–1.00)
GFR calc Af Amer: 64 mL/min/{1.73_m2} (ref >59)
GFR calc non Af Amer: 55 mL/min/{1.73_m2} — ABNORMAL LOW (ref >59)
Glucose: 118 mg/dL — ABNORMAL HIGH (ref 65–99)
Potassium: 4.5 mmol/L (ref 3.5–5.2)
Sodium: 139 mmol/L (ref 134–144)

## 2018-09-20 LAB — HEPATIC FUNCTION PANEL
ALT: 14 IU/L (ref 0–32)
AST: 14 IU/L (ref 0–40)
Albumin: 4 g/dL (ref 3.8–4.8)
Alkaline Phosphatase: 108 IU/L (ref 39–117)
Bilirubin Total: 0.3 mg/dL (ref 0.0–1.2)
Bilirubin, Direct: 0.15 mg/dL (ref 0.00–0.40)
Total Protein: 7.5 g/dL (ref 6.0–8.5)

## 2018-09-20 LAB — CBC
Hematocrit: 41.8 % (ref 34.0–46.6)
Hemoglobin: 13.7 g/dL (ref 11.1–15.9)
MCH: 28.9 pg (ref 26.6–33.0)
MCHC: 32.8 g/dL (ref 31.5–35.7)
MCV: 88 fL (ref 79–97)
Platelets: 250 10*3/uL (ref 150–450)
RBC: 4.74 x10E6/uL (ref 3.77–5.28)
RDW: 14.4 % (ref 11.7–15.4)
WBC: 5.5 10*3/uL (ref 3.4–10.8)

## 2018-09-20 LAB — LIPID PANEL
Chol/HDL Ratio: 4.4 ratio (ref 0.0–4.4)
Cholesterol, Total: 235 mg/dL — ABNORMAL HIGH (ref 100–199)
HDL: 54 mg/dL (ref >39)
LDL Calculated: 164 mg/dL — ABNORMAL HIGH (ref 0–99)
Triglycerides: 86 mg/dL (ref 0–149)
VLDL Cholesterol Cal: 17 mg/dL (ref 5–40)

## 2018-09-20 MED ORDER — HYDRALAZINE HCL 100 MG PO TABS: 100.0000 mg | ORAL_TABLET | Freq: Three times a day (TID) | ORAL | 1 refills | Status: DC

## 2018-09-20 MED ORDER — LISINOPRIL 40 MG PO TABS: 40.0000 mg | ORAL_TABLET | Freq: Every day | ORAL | 3 refills | Status: DC

## 2018-09-20 MED ORDER — FUROSEMIDE 40 MG PO TABS: 40.0000 mg | ORAL_TABLET | Freq: Every day | ORAL | 3 refills | Status: DC

## 2018-09-20 MED ORDER — ISOSORBIDE DINITRATE 30 MG PO TABS: 30.0000 mg | ORAL_TABLET | Freq: Three times a day (TID) | ORAL | 3 refills | Status: DC

## 2018-09-20 MED ORDER — CARVEDILOL 25 MG PO TABS: 25.0000 mg | ORAL_TABLET | Freq: Two times a day (BID) | ORAL | 3 refills | Status: DC

## 2018-09-20 MED ORDER — AMLODIPINE BESYLATE 10 MG PO TABS: 10.0000 mg | ORAL_TABLET | Freq: Every day | ORAL | 3 refills | Status: DC

## 2018-09-20 NOTE — Patient Instructions (Addendum)
We will be checking the following labs today - BMET, CBC, HPF, Lipids and TSH   Medication Instructions:    Continue with your current medicines. I have sent in your refills   If you need a refill on your cardiac medications before your next appointment, please call your pharmacy.     Testing/Procedures To Be Arranged:  N/A  Follow-Up:   See me in 3 to 4 months    At [facility], you and your health needs are our priority.  As part of our continuing mission to provide you with exceptional heart care, we have created designated Provider Care Teams.  These Care Teams include your primary Cardiologist (physician) and Advanced Practice Providers (APPs -  Physician Assistants and Nurse Practitioners) who all work together to provide you with the care you need, when you need it.  Special Instructions:  . Think about what we talked about today  Call the [place] office at [phone] if you have any questions, problems or concerns.

## 2018-09-20 NOTE — Progress Notes (Signed)
Tracheostomy Procedure Note  [name] [name] [id number] [birth year]  Pre Procedure Tracheostomy Information  Trach Brand: Bivona Size: 5.0 Style: Uncuffed Secured by: Velcro   Procedure: trach change    Post Procedure Tracheostomy Information  Trach Brand: Bivona Size: 5.0 Style: Uncuffed Secured by: Velcro   Post Procedure Evaluation:  ETCO2 positive color change from yellow to purple : Yes.   Vital signs: pulse 88, respirations 20 and pulse oximetry 93 % Patients current condition: stable Complications: No apparent complications Trach site exam: clean, dry, no drainage Wound care done: dry, sterile, clean and 4 x 4 gauze Patient did tolerate procedure well.   Education: none  Prescription needs: none    Additional needs: Additional red caps sent home with patient.

## 2018-09-20 NOTE — Progress Notes (Signed)
CARDIOLOGY OFFICE NOTE  Date:  09/20/2018    [name] Date of Birth: 05-23-1981 Medical Record #[id number]  PCP:  Flossie Buffy, NP  Cardiologist:  Marisa Cyphers    Chief Complaint  Patient presents with  . Hypertension  . Follow-up    Follow up visit -seen for Dr. Marlou Porch    History of Present Illness: [name] [name] is a 38 y.o. female who presents today for a follow up visit. Seen for Dr. Marlou Porch.   She has a hx of HTN, OSA, morbid obesity, & gestational diabetes. She was admitted in 10/17 respiratory failure in the setting of HTN emergency with pulmonary edema and OHS/OSA requiring intubation. She ultimately underwent tracheostomy due to prolonged respiratory failure. She did have prolonged sinus pauses during coughing and straining against the ventilator. This was reviewed with EP and she was not felt to need a PPM as her pauses were likely related to underlying OSA/OHS. Echocardiogram did demonstrate pulmonary hypertension with PASP 42 and normal ejection fraction.She was admitted in 1/18 with acute renal failure in the setting of dehydration. She has been followed in our hypertension clinic to help with control of blood pressure.   Last seen by Dr. Marlou Porch 6/18. Last seen by me in December of 2018. She has been followed in the HTN clinic since - last visit in March of 2019.   Comes in today. Here alone. She feels pretty good. No chest pain. Breathing is ok. Needs her medicines refilled - just took prior to the visit this morning. She still has her trach in place - she is quite hesitant (and scared) to have removed - like a security blanket for her. She has gained weight - now over 400#. She admits it is a coping mechanism. She loves sugar as well. Denies having any real orthopedic limitations. Her daughter is now 88 years old. She says she is now considering weight loss surgery. She has not had any labs in some time. She has a court hearing in April about  her disability. She is currently receiving Medicaid.   Past Medical History:  Diagnosis Date  . Chronic diastolic heart failure ([place])    Echo 10/12: EF 50-55 // b. Echo 10/17: EF 50-55, mild BAE, PASP 42 // Echo 10/18: Mild concentric LVH, EF 55-60, normal wall motion, mild MAC, severe LAE, mild TR, PASP 15, trivial pericardial effusion  . CKD (chronic kidney disease) stage 4, GFR 15-29 ml/min (HCC) 10/25/2016   admitted in 2/18 with SCr of 10 in the setting of dehydration from illness, etc >> improved to 5 at DC  . DM2 (diabetes mellitus, type 2) (HCC)    A1c 7.2 in 05/2016  . Gestational diabetes mellitus in pregnancy 05/24/2013  . HTN in pregnancy, chronic 05/24/2013  . Hypertensive heart disease with CHF (congestive heart failure) ([place]) 05/25/2016  . Morbid obesity ([place])   . OSA (obstructive sleep apnea) 07/09/2011   s/p Trach 2/2 prolonged respiratory failure during admx for CHF in 05/2016  . Pulmonary hypertension ([place])    secondary from CHF/OSA  . Sinus tachycardia     Past Surgical History:  Procedure Laterality Date  . TRACHEOSTOMY TUBE PLACEMENT N/A 06/11/2016   Procedure: TRACHEOSTOMY;  Surgeon: Melida Quitter, MD;  Location: [place];  Service: ENT;  Laterality: N/A;     Medications: Current Meds  Medication Sig  . amLODipine (NORVASC) 10 MG tablet Take 1 tablet (10 mg total) by mouth daily.  . carvedilol (  COREG) 25 MG tablet Take 1 tablet (25 mg total) by mouth 2 (two) times daily.  . fluticasone (FLONASE) 50 MCG/ACT nasal spray Place 2 sprays into both nostrils 2 (two) times daily.  . furosemide (LASIX) 40 MG tablet Take 1 tablet (40 mg total) by mouth daily.  . hydrALAZINE (APRESOLINE) 100 MG tablet Take 1 tablet (100 mg total) by mouth 3 (three) times daily.  . isosorbide dinitrate (ISORDIL) 30 MG tablet Take 1 tablet (30 mg total) by mouth 3 (three) times daily.  Marland Kitchen lisinopril (PRINIVIL,ZESTRIL) 40 MG tablet Take 1 tablet (40 mg total) by mouth daily.  Marland Kitchen nystatin  ointment (MYCOSTATIN) Apply topically 2 (two) times daily.  . [DISCONTINUED] amLODipine (NORVASC) 10 MG tablet Take 1 tablet (10 mg total) by mouth daily.  . [DISCONTINUED] carvedilol (COREG) 25 MG tablet Take 1 tablet (25 mg total) by mouth 2 (two) times daily.  . [DISCONTINUED] furosemide (LASIX) 40 MG tablet Take 1 tablet (40 mg total) by mouth daily. Please make yearly appt with Dr. Marlou Porch for December. 1st attempt  . [DISCONTINUED] hydrALAZINE (APRESOLINE) 100 MG tablet TAKE 1 TABLET (100 MG TOTAL) BY MOUTH 3 (THREE) TIMES DAILY.  . [DISCONTINUED] isosorbide dinitrate (ISORDIL) 30 MG tablet Take 1 tablet (30 mg total) by mouth 3 (three) times daily. Please make yearly appt with Dr. Marlou Porch for December. 1st attempt  . [DISCONTINUED] lisinopril (PRINIVIL,ZESTRIL) 40 MG tablet Take 1 tablet (40 mg total) by mouth daily.     Allergies: Allergies  Allergen Reactions  . No Known Allergies     Social History: The patient  reports that she has never smoked. She has never used smokeless tobacco. She reports that she does not drink alcohol or use drugs.   Family History: The patient's family history includes Birth defects in her paternal grandfather; Diabetes in an other family member; Hypertension in her mother.   Review of Systems: Please see the history of present illness.   Otherwise, the review of systems is positive for none.   All other systems are reviewed and negative.   Physical Exam: VS:  BP (!) 160/100 (BP Location: Right Wrist, Patient Position: Sitting, Cuff Size: Normal)   Pulse 84   Ht _0  (1.651 m)   Wt (!) 407 lb 12.8 oz (185 kg)   BMI 67.86 kg/m  .  BMI Body mass index is 67.86 kg/m.  Wt Readings from Last 3 Encounters:  09/20/18 (!) 407 lb 12.8 oz (185 kg)  03/15/18 (!) 389 lb (176.4 kg)  11/10/17 (!) 388 lb 12.8 oz (176.4 kg)   I palpated her BP using the right forearm - it is 161 systolic.   General: Pleasant. She got pretty tearful here today. She is alert  and in no acute distress.  Her weight is up 18 pounds since July.  HEENT: Normal.  Neck: Supple, no JVD, carotid bruits, or masses noted.  Cardiac: Regular rate and rhythm. No murmurs, rubs, or gallops. No edema.  Respiratory:  Lungs are clear to auscultation bilaterally with normal work of breathing.  GI: Obese.  MS: No deformity or atrophy. Gait and ROM intact.  Skin: Warm and dry. Color is normal.  Neuro:  Strength and sensation are intact and no gross focal deficits noted.  Psych: Alert, appropriate and with normal affect.   LABORATORY DATA:  EKG:  EKG is ordered today. This demonstrates NSR with LBBB. This LBBB may be new. Multiple prior EKGs are reviewed.   Lab Results  Component Value  Date   WBC 8.4 09/20/2016   HGB 9.1 (L) 09/20/2016   HCT 27.6 (L) 09/20/2016   PLT 469 (H) 09/20/2016   GLUCOSE 104 (H) 11/10/2017   CHOL 176 09/15/2016   TRIG 250.0 (H) 09/15/2016   HDL 10.60 (L) 09/15/2016   LDLDIRECT 99.0 09/15/2016   LDLCALC 92 05/26/2016   ALT 16 09/16/2016   AST 18 09/16/2016   NA 140 11/10/2017   K 4.4 11/10/2017   CL 98 11/10/2017   CREATININE 1.28 (H) 11/10/2017   BUN 22 (H) 11/10/2017   CO2 29 11/10/2017   TSH 2.615 05/14/2011   INR 1.27 09/18/2016   HGBA1C 6.2 09/15/2016     BNP (last 3 results) No results for input(s): BNP in the last 8760 hours.  ProBNP (last 3 results) No results for input(s): PROBNP in the last 8760 hours.   Other Studies Reviewed Today:  Echo Study Conclusions 05/2017  - Left ventricle: The cavity size was normal. There was mild concentric hypertrophy. Systolic function was normal. The estimated ejection fraction was in the range of 55% to 60%. Wall motion was normal; there were no regional wall motion abnormalities. The study is not technically sufficient to allow evaluation of LV diastolic function. - Aortic valve: Transvalvular velocity was within the normal range. There was no stenosis. There was no  regurgitation. - Mitral valve: Mildly calcified annulus. Transvalvular velocity was within the normal range. There was no evidence for stenosis. There was no regurgitation. - Left atrium: The atrium was severely dilated. - Right ventricle: The cavity size was normal. Wall thickness was normal. Systolic function was normal. - Tricuspid valve: There was mild regurgitation. - Pulmonary arteries: Systolic pressure was within the normal range. PA peak pressure: 15 mm Hg (S). - Pericardium, extracardiac: A trivial pericardial effusion was identified.   Echo 05/26/16 EF 50-55, mild BAE, PASP 42  Renal artery duplex 10/12 Normal renal arteries bilaterally  Echo 06/17/11: mild LVH, EF 50-55%, mild LAE.   Assessment/Plan:   1.Hypertensive heart disease with chronic diastolic congestive heart failure ([place]) Hard to say what her control actually is just because of her body habitus. I've palpated a BP using the forearm with a large cuff and noted 409 systolic. She says she is taking her medicines. Refilled everything today. Lab today.   2.Chronic diastolic heart failure ([place]) Her weight continues to climb. She is not short of breath. See #3.  3.Morbid obesity ([place]) We've had a long discussion today. This was the crux of our visit. She admits to stress eating - loves sugars, portions too large. She is considering gastric bypass. She knows she "has to do something" and wants to lose weight. She understands her life is going to be shorter if this is not corrected. We have given her information regarding how to sign up for the [facility] weight loss clinic/open forum. We talked about tracking her food intake and then trying to reduce. We talked about [employer] program, [employer], etc. This remains the main crux of her issues.    4.Pulmonary hypertension ([place]) Continuefollow upwith Pulmonology. She is seeing them tomorrow to get her oxygen re certified. This was not discussed today.   5.  Possible LBBB - hard to say - she has no active symptoms. Past EKGs with varying changes. Her weight would limit any type of testing. She does not need cardiac catheterization. She needs aggressive risk factor modification with weight loss. Will ask Dr. Marlou Porch to give his recommendations as well.   Current  medicines are reviewed with the patient today.  The patient does not have concerns regarding medicines other than what has been noted above.  The following changes have been made:  See above.  Labs/ tests ordered today include:    Orders Placed This Encounter  Procedures  . Basic metabolic panel  . CBC  . Hepatic function panel  . Lipid panel  . TSH  . EKG 12-Lead     Disposition:   FU with me in a few months. Prognosis is tenuous.   Patient is agreeable to this plan and will call if any problems develop in the interim.   SignedTruitt Merle, NP  09/20/2018 12:54 PM  [facility] [address] [place], [place]  [address] Phone: [phone] Fax: [phone]

## 2018-09-26 ENCOUNTER — Telehealth: Payer: Self-pay | Admitting: Nurse Practitioner

## 2018-09-26 NOTE — Telephone Encounter (Signed)
No FMLA/Disability forms on file for patient.

## 2018-09-26 NOTE — Telephone Encounter (Signed)
Pt called and wanted to check status of a disability letter. She stated she spoke with Cecille Rubin at her last visit to get a letter for disability per lawyers request explaining condition

## 2018-10-10 NOTE — Telephone Encounter (Signed)
lvm will contact pt once Cecille Rubin reviews and recommends about letter for disability.

## 2018-10-10 NOTE — Telephone Encounter (Signed)
Follow up   PT is calling back, She said she needs a letter from her Cardiologist stating her medical conditions so she can give it to her lawyers.  Please call back

## 2018-10-10 NOTE — Telephone Encounter (Signed)
She can have a copy of my last note - that outlines all her issues.

## 2018-10-11 NOTE — Telephone Encounter (Signed)
S/w pt stated [name]'s ov note was ok for pt's lawyers.   Pt gave new address [address] [name], [place] [address].  In mail today.

## 2018-10-13 NOTE — Progress Notes (Signed)
[facility]  [address] [place], [place], [address] Phone: [phone]   Fax:  [phone]   October 13, 2018   Patient: [name] [name]  MRN: [id number]  Date of Birth: 09-10-1980   To Whom it may concern,   The above patient has been a patient of mine since 2017. She has the following medical diagnoses:  Chronic hypoxic and hypercarbic respiratory failure, obstructive sleep apnea, tracheostomy dependence, exertional dyspnea, pulmonary hypertension and diastolic heart failure. These diagnoses have resulted in limited activity tolerance and  Occasional need for oxygen. Please consider her for disability.   If you have questions, please do not hesitate to call me. Sincerely,  Clementeen Graham, NP

## 2018-10-25 ENCOUNTER — Encounter: Payer: Self-pay | Admitting: Acute Care

## 2018-10-25 ENCOUNTER — Encounter: Payer: Self-pay | Admitting: *Deleted

## 2018-10-25 NOTE — Progress Notes (Signed)
Patient: [name] [name]  MRN: [id number]  Date of Birth: 1980-10-07   To Whom it may concern,   The above patient has been a patient of mine since 2017. She has the following medical diagnoses:  Chronic hypoxic and hypercarbic respiratory failure, obstructive sleep apnea, tracheostomy dependence, exertional dyspnea, pulmonary hypertension and diastolic heart failure. These diagnoses have resulted in limited activity tolerance and  Occasional need for oxygen. Please consider her for disability.   If you have questions, please do not hesitate to call me. Sincerely,  Clementeen Graham, NP

## 2018-10-25 NOTE — Progress Notes (Signed)
[facility]  [address] [place], [place], [address] Phone: [phone]   Fax:  [phone]   October 25, 2018   Patient: [name] [name]  MRN: [id number]  Date of Birth: May 31, 1981   To Whom it may concern,   The above patient has been a patient of mine since 2017. She has the following medical diagnoses:  Chronic hypoxic and hypercarbic respiratory failure, obstructive sleep apnea, tracheostomy dependence, exertional dyspnea, pulmonary hypertension and diastolic heart failure. These diagnoses have resulted in limited activity tolerance and  Occasional need for oxygen. Please consider her for disability.   If you have questions, please do not hesitate to call me. Sincerely,  Clementeen Graham, NP

## 2018-10-26 ENCOUNTER — Inpatient Hospital Stay (HOSPITAL_COMMUNITY)
Admission: RE | Admit: 2018-10-26 | Discharge: 2018-10-26 | Disposition: A | Discharge status: Home / Self Care | Payer: Medicaid Other | Source: Ambulatory Visit

## 2018-11-08 ENCOUNTER — Other Ambulatory Visit: Payer: Self-pay | Admitting: Acute Care

## 2018-11-08 MED ORDER — FLUTICASONE PROPIONATE 50 MCG/ACT NA SUSP: 2.0000 | Freq: Two times a day (BID) | NASAL | 6 refills | Status: DC

## 2018-11-09 ENCOUNTER — Inpatient Hospital Stay (HOSPITAL_COMMUNITY)
Admission: RE | Admit: 2018-11-09 | Discharge: 2018-11-09 | Disposition: A | Discharge status: Home / Self Care | Payer: Self-pay | Source: Ambulatory Visit

## 2018-12-07 ENCOUNTER — Telehealth: Payer: Self-pay | Admitting: Nurse Practitioner

## 2018-12-07 NOTE — Telephone Encounter (Signed)
I called and left message on patient voicemail per Wilfred Lacy to call office and schedule CPE.

## 2018-12-11 ENCOUNTER — Telehealth: Payer: Self-pay | Admitting: *Deleted

## 2018-12-11 NOTE — Telephone Encounter (Signed)
Virtual Visit Pre-Appointment Phone Call  Steps For Call:  1. Confirm consent - "In the setting of the current Covid19 crisis, you are scheduled for a (phone or video) visit with your provider on (Monday, April 27 ) at (11:00 am).  Just as we do with many in-office visits, in order for you to participate in this visit, we must obtain consent.  If you'd like, I can send this to your mychart (if signed up) or email for you to review.  Otherwise, I can obtain your verbal consent now.  All virtual visits are billed to your insurance company just like a normal visit would be.  By agreeing to a virtual visit, we'd like you to understand that the technology does not allow for your provider to perform an examination, and thus may limit your provider's ability to fully assess your condition. If your provider identifies any concerns that need to be evaluated in person, we will make arrangements to do so.  Finally, though the technology is pretty good, we cannot assure that it will always work on either your or our end, and in the setting of a video visit, we may have to convert it to a phone-only visit.  In either situation, we cannot ensure that we have a secure connection.  Are you willing to proceed?" STAFF: Did the patient verbally acknowledge consent to telehealth visit? Document YES/NO here: YES   2. Confirm the BEST phone number to call the day of the visit by including in appointment notes  3. Give patient instructions for WebEx/MyChart download to smartphone as below or Doximity/Doxy.me if video visit (depending on what platform provider is using)  4. Advise patient to be prepared with their blood pressure, heart rate, weight, any heart rhythm information, their current medicines, and a piece of paper and pen handy for any instructions they may receive the day of their visit  5. Inform patient they will receive a phone call 15 minutes prior to their appointment time (may be from unknown caller ID)  so they should be prepared to answer  6. Confirm that appointment type is correct in Epic appointment notes (VIDEO vs PHONE)     TELEPHONE CALL NOTE  [name] [name] has been deemed a candidate for a follow-up tele-health visit to limit community exposure during the Covid-19 pandemic. I spoke with the patient via phone to ensure availability of phone/video source, confirm preferred email & phone number, and discuss instructions and expectations.  I reminded LIZET KELSO to be prepared with any vital sign and/or heart rhythm information that could potentially be obtained via home monitoring, at the time of her visit. I reminded FATIME BISWELL to expect a phone call at the time of her visit if her visit.  Demonte Dobratz Avanell Shackleton 12/11/2018 2:17 PM   INSTRUCTIONS FOR DOWNLOADING THE [place] APP TO SMARTPHONE  - If Apple, ask patient to go to App Store and type in WebEx in the search bar. [place] [employer], the blue/green circle. If Android, go to [employer] and type in [employer] in the search bar. The app is free but as with any other app downloads, their phone may require them to verify saved payment information or Apple/Android password.  - The patient does NOT have to create an account. - On the day of the visit, the assist will walk the patient through joining the meeting with the meeting number/password.  INSTRUCTIONS FOR DOWNLOADING THE MYCHART APP TO SMARTPHONE  -  The patient must first make sure to have activated MyChart and know their login information - If Apple, go to [employer] and type in MyChart in the search bar and download the app. If Android, ask patient to go to [employer] and type in [place] in the search bar and download the app. The app is free but as with any other app downloads, their phone may require them to verify saved payment information or Apple/Android password.  - The patient will need to then log into the app with their MyChart username  and password, and select [facility] as their healthcare provider to link the account. When it is time for your visit, go to the MyChart app, find appointments, and click Begin Video Visit. Be sure to Select Allow for your device to access the Microphone and Camera for your visit. You will then be connected, and your provider will be with you shortly.  **If they have any issues connecting, or need assistance please contact MyChart service desk [phone])**  **If using a computer, in order to ensure the best quality for their visit they will need to use either of the following Internet Browsers: [employer], or Google Chrome**  IF USING DOXIMITY or DOXY.ME - The patient will receive a link just prior to their visit, either by text or email (to be determined day of appointment depending on if it's doxy.me or Doximity).     FULL LENGTH CONSENT FOR TELE-HEALTH VISIT   I hereby voluntarily request, consent and authorize [facility] and its employed or contracted physicians, physician assistants, nurse practitioners or other licensed health care professionals (the Practitioner), to provide me with telemedicine health care services (the "Services") as deemed necessary by the treating Practitioner. I acknowledge and consent to receive the Services by the Practitioner via telemedicine. I understand that the telemedicine visit will involve communicating with the Practitioner through live audiovisual communication technology and the disclosure of certain medical information by electronic transmission. I acknowledge that I have been given the opportunity to request an in-person assessment or other available alternative prior to the telemedicine visit and am voluntarily participating in the telemedicine visit.  I understand that I have the right to withhold or withdraw my consent to the use of telemedicine in the course of my care at any time, without affecting my right to future care or  treatment, and that the Practitioner or I may terminate the telemedicine visit at any time. I understand that I have the right to inspect all information obtained and/or recorded in the course of the telemedicine visit and may receive copies of available information for a reasonable fee.  I understand that some of the potential risks of receiving the Services via telemedicine include:  [place] Kitchen Delay or interruption in medical evaluation due to technological equipment failure or disruption; . Information transmitted may not be sufficient (e.g. poor resolution of images) to allow for appropriate medical decision making by the Practitioner; and/or  . In rare instances, security protocols could fail, causing a breach of personal health information.  Furthermore, I acknowledge that it is my responsibility to provide information about my medical history, conditions and care that is complete and accurate to the best of my ability. I acknowledge that Practitioner's advice, recommendations, and/or decision may be based on factors not within their control, such as incomplete or inaccurate data provided by me or distortions of diagnostic images or specimens that may result from electronic transmissions. I understand that the practice of  medicine is not an Chief Strategy Officer and that Practitioner makes no warranties or guarantees regarding treatment outcomes. I acknowledge that I will receive a copy of this consent concurrently upon execution via email to the email address I last provided but may also request a printed copy by calling the office of [facility].    I understand that my insurance will be billed for this visit.   I have read or had this consent read to me. . I understand the contents of this consent, which adequately explains the benefits and risks of the Services being provided via telemedicine.  . I have been provided ample opportunity to ask questions regarding this consent and the Services and have had my  questions answered to my satisfaction. . I give my informed consent for the services to be provided through the use of telemedicine in my medical care  By participating in this telemedicine visit I agree to the above.

## 2018-12-17 NOTE — Progress Notes (Signed)
Telehealth Visit     Virtual Visit via Video Note   This visit type was conducted due to national recommendations for restrictions regarding the COVID-19 Pandemic (e.g. social distancing) in an effort to limit this patient's exposure and mitigate transmission in our community.  Due to her co-morbid illnesses, this patient is at least at moderate risk for complications without adequate follow up.  This format is felt to be most appropriate for this patient at this time.  All issues noted in this document were discussed and addressed.  A limited physical exam was performed with this format.  Please refer to the patient's chart for her consent to telehealth for [facility].   Evaluation Performed:  Follow-up visit  This visit type was conducted due to national recommendations for restrictions regarding the COVID-19 Pandemic (e.g. social distancing).  This format is felt to be most appropriate for this patient at this time.  All issues noted in this document were discussed and addressed.  No physical exam was performed (except for noted visual exam findings with Video Visits).  Please refer to the patient's chart (MyChart message for video visits and phone note for telephone visits) for the patient's consent to telehealth for [facility].  Date:  12/18/2018   ID:  [name] [name], DOB May 26, 1981, MRN [id number]  Patient Location:  Home  Provider location:   Home  PCP:  Nche, Charlene Brooke, NP  Cardiologist:  Marisa Cyphers Electrophysiologist:  None   Chief Complaint:  Follow up visit.   History of Present Illness:    [name] [name] is a 38 y.o. female who presents via audio/video conferencing for a telehealth visit today.  Seen for Dr. Marlou Porch.   She has a hx of HTN, OSA, morbid obesity, & gestational diabetes. She was admitted in 10/17 respiratory failure in the setting of HTN emergency with pulmonary edema and OHS/OSA requiring intubation. She ultimately underwent  tracheostomy due to prolonged respiratory failure. She did have prolonged sinus pauses during coughing and straining against the ventilator. This was reviewed with EP and she was not felt to need a PPM as her pauses were likely related to underlying OSA/OHS. Echocardiogram did demonstrate pulmonary hypertension with PASP 42 and normal ejection fraction.She was admitted in 1/18 with acute renal failure in the setting of dehydration. She has been followed in our hypertension clinic to help with control of blood pressure.   Last seen by Dr. Marlou Porch 6/18.  She has been followed in the HTN clinic since - last visit in March of 2019.   Last seen by me this past January 2020 -  she needed her medicines refilled. She had gained more weight - over 400#. Admits to stress eating and using food as a coping mechanism. Fortunately, was feeling ok. She loves sugar. No real orthopedic issues. She has a 62 year old daughter. [name] remains in place - she is pretty hesitant to have it removed. She was beginning to consider weight loss surgery. She was to have a court hearing about her disability this month.   The patient does not have symptoms concerning for COVID-19 infection (fever, chills, cough, or new shortness of breath).   Seen today via Doximity video. She has consented for this visit. She is doing ok. Her father has passed - unclear as to why. He was found at his desk - no autopsy planned - he had just turned 75. This has caused stress for her - totally understandable. BP 140/89 today. She has  been taking all of her medicine. Lasix needs refilling today. She attempted to go to the bariatric meeting - but she is not eligible for surgery in [place] due to having Medicaid. She would like to have her surgery here in the [facility] since her other doctors are here and know her situation - especially her trach.  Her court hearing is for this week - this will be virtual as well. She knew this visit was coming up  - she did a soup diet and has lost some weight - it was not full of salt - seemed to be more vegetables and fruits. She does love sugar.    Past Medical History:  Diagnosis Date   Chronic diastolic heart failure ([place])    Echo 10/12: EF 50-55 // b. Echo 10/17: EF 50-55, mild BAE, PASP 42 // Echo 10/18: Mild concentric LVH, EF 55-60, normal wall motion, mild MAC, severe LAE, mild TR, PASP 15, trivial pericardial effusion   CKD (chronic kidney disease) stage 4, GFR 15-29 ml/min (HCC) 10/25/2016   admitted in 2/18 with SCr of 10 in the setting of dehydration from illness, etc >> improved to 5 at DC   DM2 (diabetes mellitus, type 2) (HCC)    A1c 7.2 in 05/2016   Gestational diabetes mellitus in pregnancy 05/24/2013   HTN in pregnancy, chronic 05/24/2013   Hypertensive heart disease with CHF (congestive heart failure) ([place]) 05/25/2016   Morbid obesity ([place])    OSA (obstructive sleep apnea) 07/09/2011   s/p Trach 2/2 prolonged respiratory failure during admx for CHF in 05/2016   Pulmonary hypertension ([place])    secondary from CHF/OSA   Sinus tachycardia    Past Surgical History:  Procedure Laterality Date   TRACHEOSTOMY TUBE PLACEMENT N/A 06/11/2016   Procedure: TRACHEOSTOMY;  Surgeon: Melida Quitter, MD;  Location: [facility] OR;  Service: ENT;  Laterality: N/A;     Current Meds  Medication Sig   amLODipine (NORVASC) 10 MG tablet Take 1 tablet (10 mg total) by mouth daily.   carvedilol (COREG) 25 MG tablet Take 1 tablet (25 mg total) by mouth 2 (two) times daily.   fluticasone (FLONASE) 50 MCG/ACT nasal spray Place 2 sprays into both nostrils 2 (two) times daily.   furosemide (LASIX) 40 MG tablet Take 1 tablet (40 mg total) by mouth daily.   hydrALAZINE (APRESOLINE) 100 MG tablet Take 1 tablet (100 mg total) by mouth 3 (three) times daily.   isosorbide dinitrate (ISORDIL) 30 MG tablet Take 1 tablet (30 mg total) by mouth 3 (three) times daily.   lisinopril (PRINIVIL,ZESTRIL) 40 MG  tablet Take 1 tablet (40 mg total) by mouth daily.   nystatin ointment (MYCOSTATIN) Apply topically 2 (two) times daily.   [DISCONTINUED] furosemide (LASIX) 40 MG tablet Take 1 tablet (40 mg total) by mouth daily.     Allergies:   No known allergies   Social History   Tobacco Use   Smoking status: Never Smoker   Smokeless tobacco: Never Used  Substance Use Topics   Alcohol use: No   Drug use: No     Family Hx: The patient's family history includes Birth defects in her paternal grandfather; Diabetes in an other family member; Hypertension in her mother.  ROS:   Please see the history of present illness.   All other systems reviewed are negative.    Objective:    Vital Signs:  BP 140/89    Pulse 82    Ht _0  (1.651 m)  Wt (!) 399 lb 6.4 oz (181.2 kg)    BMI 66.46 kg/m    Wt Readings from Last 3 Encounters:  12/18/18 (!) 399 lb 6.4 oz (181.2 kg)  09/20/18 (!) 407 lb 12.8 oz (185 kg)  03/15/18 (!) 389 lb (176.4 kg)    Alert female in no acute distress.   Labs/Other Tests and Data Reviewed:    Lab Results  Component Value Date   WBC 5.5 09/20/2018   HGB 13.7 09/20/2018   HCT 41.8 09/20/2018   PLT 250 09/20/2018   GLUCOSE 118 (H) 09/20/2018   CHOL 235 (H) 09/20/2018   TRIG 86 09/20/2018   HDL 54 09/20/2018   LDLDIRECT 99.0 09/15/2016   LDLCALC 164 (H) 09/20/2018   ALT 14 09/20/2018   AST 14 09/20/2018   NA 139 09/20/2018   K 4.5 09/20/2018   CL 96 09/20/2018   CREATININE 1.23 (H) 09/20/2018   BUN 24 (H) 09/20/2018   CO2 27 09/20/2018   TSH 2.970 09/20/2018   INR 1.27 09/18/2016   HGBA1C 6.2 09/15/2016     BNP (last 3 results) No results for input(s): BNP in the last 8760 hours.  ProBNP (last 3 results) No results for input(s): PROBNP in the last 8760 hours.    Prior CV studies:    The following studies were reviewed today:  EchoStudy Conclusions10/2018  - Left ventricle: The cavity size was normal. There was mild concentric  hypertrophy. Systolic function was normal. The estimated ejection fraction was in the range of 55% to 60%. Wall motion was normal; there were no regional wall motion abnormalities. The study is not technically sufficient to allow evaluation of LV diastolic function. - Aortic valve: Transvalvular velocity was within the normal range. There was no stenosis. There was no regurgitation. - Mitral valve: Mildly calcified annulus. Transvalvular velocity was within the normal range. There was no evidence for stenosis. There was no regurgitation. - Left atrium: The atrium was severely dilated. - Right ventricle: The cavity size was normal. Wall thickness was normal. Systolic function was normal. - Tricuspid valve: There was mild regurgitation. - Pulmonary arteries: Systolic pressure was within the normal range. PA peak pressure: 15 mm Hg (S). - Pericardium, extracardiac: A trivial pericardial effusion was identified.   Echo 05/26/16 EF 50-55, mild BAE, PASP 42  Renal artery duplex 10/12 Normal renal arteries bilaterally  Echo 06/17/11: mild LVH, EF 50-55%, mild LAE.   ASSESSMENT & PLAN:     1.Hypertensive heart disease with chronic diastolic congestive heart failure ([place]) - her body habitus makes it difficult to really know what her BP is - seems ok by her readings at home. Lasix refilled today.   2.Chronic diastolic heart failure ([place]) Her weightis down a few pounds.  She is not short of breath. See #3.  3.Morbid obesity ([place]) This has been the crux of her issues. She wishes to try and proceed with bariatric surgery and would prefer to do this in [facility] since she is followed by other providers and has her trach still in place. Will send message to Dr. Kaylyn Lim for his input. She could possibly be eligible at [facility] or [place]. She is trying to eat a little better - stress plays a big role for her.   4.Pulmonary hypertension ([place]) She  continues to follow with pulmonary. She uses oxygen prn.   5. Possible LBBB - on prior EKG - needs repeating when we can do in the office - she has had no  active symptoms of chest pain. Breathing is surprisingly stable. Past EKGs with varying changes. Her weight would limit any type of testing. She needs aggressive risk factor modification with weight loss.   6. COVID-19 Education: The signs and symptoms of COVID-19 were discussed with the patient and how to seek care for testing (follow up with PCP or arrange E-visit).  The importance of social distancing, staying at home and hand hygiene were discussed today.  Patient Risk:   After full review of this patient's clinical status, I feel that they are at least moderate risk at this time.  Time:   Today, I have spent 15 minutes with the patient with telehealth technology discussing the above issues.     Medication Adjustments/Labs and Tests Ordered: Current medicines are reviewed at length with the patient today.  Concerns regarding medicines are outlined above.   Tests Ordered: No orders of the defined types were placed in this encounter.   Medication Changes: Meds ordered this encounter  Medications   furosemide (LASIX) 40 MG tablet    Sig: Take 1 tablet (40 mg total) by mouth daily.    Dispense:  90 tablet    Refill:  3    Order Specific Question:   Supervising Provider    Answer:   [place], PETER M [[id number]]    Disposition:  FU with me in August/September with EKG and labs in office.     Patient is agreeable to this plan and will call if any problems develop in the interim.   Amie Critchley, NP  12/18/2018 11:18 AM    [place]

## 2018-12-18 ENCOUNTER — Encounter: Payer: Self-pay | Admitting: Nurse Practitioner

## 2018-12-18 ENCOUNTER — Other Ambulatory Visit: Payer: Self-pay | Admitting: Nurse Practitioner

## 2018-12-18 ENCOUNTER — Other Ambulatory Visit: Payer: Self-pay

## 2018-12-18 ENCOUNTER — Telehealth (INDEPENDENT_AMBULATORY_CARE_PROVIDER_SITE_OTHER): Payer: Medicare Other | Admitting: Nurse Practitioner

## 2018-12-18 VITALS — BP 140/89 | HR 82 | Ht 65.0 in | Wt 399.4 lb

## 2018-12-18 DIAGNOSIS — I272 Pulmonary hypertension, unspecified: Secondary | ICD-10-CM

## 2018-12-18 DIAGNOSIS — Z7189 Other specified counseling: Secondary | ICD-10-CM

## 2018-12-18 DIAGNOSIS — Z930 Tracheostomy status: Secondary | ICD-10-CM

## 2018-12-18 DIAGNOSIS — I110 Hypertensive heart disease with heart failure: Secondary | ICD-10-CM | POA: Diagnosis not present

## 2018-12-18 DIAGNOSIS — I5032 Chronic diastolic (congestive) heart failure: Secondary | ICD-10-CM | POA: Diagnosis not present

## 2018-12-18 DIAGNOSIS — R9431 Abnormal electrocardiogram [ECG] [EKG]: Secondary | ICD-10-CM

## 2018-12-18 MED ORDER — FUROSEMIDE 40 MG PO TABS: 40.0000 mg | ORAL_TABLET | Freq: Every day | ORAL | 3 refills | Status: DC

## 2018-12-18 NOTE — Progress Notes (Signed)
I have reached out to Dr. Kaylyn Lim - he is going to see Ms. [name] [name] regarding bariatric surgery referral.   [name] Junes, RN, [place] [address] [place],   [id number] [phone]

## 2018-12-18 NOTE — Patient Instructions (Addendum)
After Visit Summary:  We will be checking the following labs today - NONE   Medication Instructions:    Continue with your current medicines.   I sent in your refill for the Lasix.    If you need a refill on your cardiac medications before your next appointment, please call your pharmacy.     Testing/Procedures To Be Arranged:  N/A  Follow-Up:   See me in August/September for OV with EKG and labs.     At [facility], you and your health needs are our priority.  As part of our continuing mission to provide you with exceptional heart care, we have created designated Provider Care Teams.  These Care Teams include your primary Cardiologist (physician) and Advanced Practice Providers (APPs -  Physician Assistants and Nurse Practitioners) who all work together to provide you with the care you need, when you need it.  Special Instructions:  . Stay safe, stay home and wash your hands for at least 20 seconds! . It was good to talk with you today.  . I will send a message to the bariatric surgeon here in [facility] - Dr. Kaylyn Lim and get his input and will let you know.   Call the [place] office at [phone] if you have any questions, problems or concerns.

## 2018-12-18 NOTE — Progress Notes (Signed)
See my note

## 2019-01-05 ENCOUNTER — Other Ambulatory Visit: Payer: Self-pay | Admitting: Acute Care

## 2019-01-05 MED ORDER — NYSTATIN 100000 UNIT/GM EX OINT: TOPICAL_OINTMENT | Freq: Two times a day (BID) | CUTANEOUS | 0 refills | Status: DC

## 2019-01-25 ENCOUNTER — Ambulatory Visit (HOSPITAL_COMMUNITY): Payer: Self-pay

## 2019-03-09 ENCOUNTER — Other Ambulatory Visit: Payer: Self-pay | Admitting: Acute Care

## 2019-03-13 ENCOUNTER — Other Ambulatory Visit: Payer: Self-pay | Admitting: Acute Care

## 2019-03-13 DIAGNOSIS — Z930 Tracheostomy status: Secondary | ICD-10-CM

## 2019-03-14 ENCOUNTER — Other Ambulatory Visit: Payer: Self-pay | Admitting: Acute Care

## 2019-03-15 ENCOUNTER — Inpatient Hospital Stay (HOSPITAL_COMMUNITY): Admission: RE | Admit: 2019-03-15 | Payer: Medicaid Other | Source: Ambulatory Visit

## 2019-03-16 ENCOUNTER — Other Ambulatory Visit: Payer: Self-pay

## 2019-03-16 DIAGNOSIS — Z20822 Contact with and (suspected) exposure to covid-19: Secondary | ICD-10-CM

## 2019-03-19 LAB — NOVEL CORONAVIRUS, NAA: SARS-CoV-2, NAA: NOT DETECTED

## 2019-03-22 ENCOUNTER — Other Ambulatory Visit: Payer: Self-pay

## 2019-03-22 ENCOUNTER — Ambulatory Visit (HOSPITAL_COMMUNITY)
Admission: RE | Admit: 2019-03-22 | Discharge: 2019-03-22 | Disposition: A | Discharge status: Home / Self Care | Payer: Medicare Other | Source: Ambulatory Visit | Attending: Acute Care | Admitting: Acute Care

## 2019-03-22 DIAGNOSIS — I110 Hypertensive heart disease with heart failure: Secondary | ICD-10-CM | POA: Diagnosis not present

## 2019-03-22 DIAGNOSIS — R490 Dysphonia: Secondary | ICD-10-CM | POA: Insufficient documentation

## 2019-03-22 DIAGNOSIS — I5032 Chronic diastolic (congestive) heart failure: Secondary | ICD-10-CM | POA: Diagnosis not present

## 2019-03-22 DIAGNOSIS — G4733 Obstructive sleep apnea (adult) (pediatric): Secondary | ICD-10-CM | POA: Insufficient documentation

## 2019-03-22 DIAGNOSIS — Z930 Tracheostomy status: Secondary | ICD-10-CM

## 2019-03-22 NOTE — Progress Notes (Signed)
Tracheostomy Procedure Note  [name] [name] [id number] Jul 02, 1981  Pre Procedure Tracheostomy Information  Trach Brand: Portex Bivona Size: 6.0 Style: Uncuffed Secured by: Velcro   Procedure: Trach cleaning and trach change    Post Procedure Tracheostomy Information  Trach Brand: Portex  Bivona Size: 6.0 Style: Uncuffed Secured by: Velcro   Post Procedure Evaluation:  ETCO2 positive color change from yellow to purple : Yes.   Vital signs:blood pressure WNL, pulse 89  respirations 18 and pulse oximetry 93 % on RA Patients current condition: stable Complications: No apparent complications Trach site exam: clean Wound care done: dry Patient did tolerate procedure well.  Post trach change VSS o2 sats 93% on RA  Education: none  Prescription needs: none    Additional needs: Patientt was given 3 red caps

## 2019-03-27 NOTE — Progress Notes (Signed)
[facility]   Reason for visit:  Routine trach change  HPI:  38 year old female. Well known to me. Trach dependent d/t severe OSA. Still working on CPAP compliance at home but more recently contemplating gastric bypass surgery/ She has proven she no longer needs trach for airway BUT still needs it for sleep apnea as she does not tolerate the CPAP as well as we would like. It's been a while since her last trach change as clinic was shut down d/t COVID-19. She is having a little more vocal hoarseness than usual.  ROS  Review of Systems - History obtained from the patient General ROS: positive for  - sleep disturbance negative for - chills, fatigue, fever, hot flashes, malaise, night sweats, weight gain or weight loss Psychological ROS: negative for - anxiety, behavioral disorder, concentration difficulties, depression, irritability or mood swings ENT ROS: negative for - epistaxis, headaches, nasal congestion, nasal discharge, oral lesions, sinus pain or sore throat Allergy and Immunology ROS: negative for - hives, itchy/watery eyes, nasal congestion, postnasal drip or seasonal allergies Hematological and Lymphatic ROS: negative for - bleeding problems, blood clots, bruising or fatigue Endocrine ROS: negative for - malaise/lethargy, palpitations, skin changes, temperature intolerance or unexpected weight changes Respiratory ROS: negative for - cough, hemoptysis, orthopnea, pleuritic pain, shortness of breath, sputum changes, stridor, tachypnea or wheezing Cardiovascular ROS: negative for - chest pain, dyspnea on exertion or edema Gastrointestinal ROS: negative for - abdominal pain, appetite loss, blood in stools, change in bowel habits or heartburn Musculoskeletal ROS: negative for - gait disturbance, joint pain, joint stiffness or joint swelling Neurological ROS: negative  Vital signs:  Reviewed Exam:  Physical Exam Vitals signs and nursing note reviewed.   Constitutional:      General: She is not in acute distress.    Appearance: Normal appearance. She is obese. She is not ill-appearing, toxic-appearing or diaphoretic.  HENT:     Head: Normocephalic and atraumatic.     Nose: Nose normal. No congestion or rhinorrhea.     Mouth/Throat:     Mouth: Mucous membranes are moist.     Pharynx: Oropharyngeal exudate present. No posterior oropharyngeal erythema.  Eyes:     Conjunctiva/sclera: Conjunctivae normal.     Pupils: Pupils are equal, round, and reactive to light.  Neck:     Musculoskeletal: Normal range of motion and neck supple.  Cardiovascular:     Rate and Rhythm: Normal rate.     Heart sounds: No murmur. No friction rub. No gallop.   Pulmonary:     Effort: Pulmonary effort is normal. No respiratory distress.     Breath sounds: No wheezing or rales.  Abdominal:     General: Bowel sounds are normal.     Palpations: Abdomen is soft.  Musculoskeletal:        General: No swelling or tenderness.  Skin:    General: Skin is warm and dry.     Capillary Refill: Capillary refill takes less than 2 seconds.  Neurological:     General: No focal deficit present.     Mental Status: She is alert.  Psychiatric:        Mood and Affect: Mood normal.        Behavior: Behavior normal.        Thought Content: Thought content normal.        Judgment: Judgment normal.     Trach change/procedure: 6 portex removed. Trach site inspected. Stoma unremarkable. New cuffless portex size 6  replaced w/out difficulty.      Impression/dx  Trach dependence Severe OSA HTN Diastolic HF Discussion  Chynah is doing well from trach stand-point. We are waiting for either her weight to be down OR he CPAP tolerance to improve. I suspect for her to be compliant w/ CPAP it will need to be a mix of the two. I will not plan on decannulating her until we have her OSA better managed.  Plan  ROV 8 weeks for routine trach change     Visit time: 32 minutes.    Erick Colace ACNP-BC [place]

## 2019-04-09 ENCOUNTER — Telehealth: Payer: Self-pay | Admitting: *Deleted

## 2019-04-09 NOTE — Telephone Encounter (Signed)
S/w pt is aware upcoming appt was moved due to pt needing to be seen in office and not [facility].  Pt is r/s for new appt. Pt is doing fine at this time and has no cardiac issues.

## 2019-04-23 ENCOUNTER — Other Ambulatory Visit: Payer: Self-pay | Admitting: Nurse Practitioner

## 2019-04-23 DIAGNOSIS — I10 Essential (primary) hypertension: Secondary | ICD-10-CM

## 2019-04-24 ENCOUNTER — Ambulatory Visit: Payer: Medicaid Other | Admitting: Nurse Practitioner

## 2019-05-10 NOTE — Progress Notes (Deleted)
CARDIOLOGY OFFICE NOTE  Date:  05/14/2019    [name] [name] Date of Birth: 1981/02/26 Medical Record #[id number]  PCP:  Flossie Buffy, NP  Cardiologist:  Marisa Cyphers  No chief complaint on file.   History of Present Illness: [name] [name] is a 38 y.o. female who presents today for a follow up visit. Seen for Dr. Marlou Porch.   She has a hx of HTN, OSA,morbidobesity,&gestational diabetes. She was admitted in 10/17 respiratory failure in the setting of HTN emergency with pulmonary edema and OHS/OSA requiring intubation. She ultimately underwent tracheostomy due to prolonged respiratory failure. She did have prolonged sinus pauses during coughing and straining against the ventilator. This was reviewed with EP and she was not felt to need a PPM as her pauses were likely related to underlying OSA/OHS. Echocardiogram did demonstrate pulmonary hypertension with PASP 42 and normal ejection fraction.She was admitted in 1/18 with acute renal failure in the setting of dehydration. She has been followed in our hypertension clinic to help with control of blood pressure.   Last seen by Dr. Marlou Porch 6/18. She has been followed in the HTN clinic since - last visit in March of 2019.  I saw her last in the office this past January 2020 -  she needed her medicines refilled. She had gained more weight - over 400#. Admited to stress eating and using food as a coping mechanism. Fortunately, was feeling ok. She loves sugar. No real orthopedic issues. She has a 74 year old daughter. [name] remains in place - she is pretty hesitant to have it removed. She was beginning to consider weight loss surgery. She was to have a court hearing about her disability later that month. We did a telehealth visit in April - still struggling with her weight - wanting to consider bariatric surgery but wanting to have in [place] - I reached out to Dr. Hassell Done who was going to see if he could see her. Her  father had just died - suddent death - age 42 - unclear why and no autopsy.   The patient {does/does not:200015} have symptoms concerning for COVID-19 infection (fever, chills, cough, or new shortness of breath).   Comes in today. Here with   Past Medical History:  Diagnosis Date   Chronic diastolic heart failure ([place])    Echo 10/12: EF 50-55 // b. Echo 10/17: EF 50-55, mild BAE, PASP 42 // Echo 10/18: Mild concentric LVH, EF 55-60, normal wall motion, mild MAC, severe LAE, mild TR, PASP 15, trivial pericardial effusion   CKD (chronic kidney disease) stage 4, GFR 15-29 ml/min (HCC) 10/25/2016   admitted in 2/18 with SCr of 10 in the setting of dehydration from illness, etc >> improved to 5 at DC   DM2 (diabetes mellitus, type 2) (HCC)    A1c 7.2 in 05/2016   Gestational diabetes mellitus in pregnancy 05/24/2013   HTN in pregnancy, chronic 05/24/2013   Hypertensive heart disease with CHF (congestive heart failure) ([place]) 05/25/2016   Morbid obesity ([place])    OSA (obstructive sleep apnea) 07/09/2011   s/p Trach 2/2 prolonged respiratory failure during admx for CHF in 05/2016   Pulmonary hypertension ([place])    secondary from CHF/OSA   Sinus tachycardia     Past Surgical History:  Procedure Laterality Date   TRACHEOSTOMY TUBE PLACEMENT N/A 06/11/2016   Procedure: TRACHEOSTOMY;  Surgeon: Melida Quitter, MD;  Location: [facility];  Service: ENT;  Laterality: N/A;     Medications:  No outpatient medications have been marked as taking for the 05/15/19 encounter (Appointment) with Burtis Junes, NP.     Allergies: Allergies  Allergen Reactions   No Known Allergies     Social History: The patient  reports that she has never smoked. She has never used smokeless tobacco. She reports that she does not drink alcohol or use drugs.   Family History: The patient's ***family history includes Birth defects in her paternal grandfather; Diabetes in an other family member; Hypertension in  her mother.   Review of Systems: Please see the history of present illness.   All other systems are reviewed and negative.   Physical Exam: VS:  There were no vitals taken for this visit. Marland Kitchen  BMI There is no height or weight on file to calculate BMI.  Wt Readings from Last 3 Encounters:  12/18/18 (!) 399 lb 6.4 oz (181.2 kg)  09/20/18 (!) 407 lb 12.8 oz (185 kg)  03/15/18 (!) 389 lb (176.4 kg)    General: Pleasant. Well developed, well nourished and in no acute distress.   HEENT: Normal.  Neck: Supple, no JVD, carotid bruits, or masses noted.  Cardiac: ***Regular rate and rhythm. No murmurs, rubs, or gallops. No edema.  Respiratory:  Lungs are clear to auscultation bilaterally with normal work of breathing.  GI: Soft and nontender.  MS: No deformity or atrophy. Gait and ROM intact.  Skin: Warm and dry. Color is normal.  Neuro:  Strength and sensation are intact and no gross focal deficits noted.  Psych: Alert, appropriate and with normal affect.   LABORATORY DATA:  EKG:  EKG {ACTION; IS/IS [id number]:[id number]} ordered today. This demonstrates ***.  Lab Results  Component Value Date   WBC 5.5 09/20/2018   HGB 13.7 09/20/2018   HCT 41.8 09/20/2018   PLT 250 09/20/2018   GLUCOSE 118 (H) 09/20/2018   CHOL 235 (H) 09/20/2018   TRIG 86 09/20/2018   HDL 54 09/20/2018   LDLDIRECT 99.0 09/15/2016   LDLCALC 164 (H) 09/20/2018   ALT 14 09/20/2018   AST 14 09/20/2018   NA 139 09/20/2018   K 4.5 09/20/2018   CL 96 09/20/2018   CREATININE 1.23 (H) 09/20/2018   BUN 24 (H) 09/20/2018   CO2 27 09/20/2018   TSH 2.970 09/20/2018   INR 1.27 09/18/2016   HGBA1C 6.2 09/15/2016     BNP (last 3 results) No results for input(s): BNP in the last 8760 hours.  ProBNP (last 3 results) No results for input(s): PROBNP in the last 8760 hours.   Other Studies Reviewed Today:  EchoStudy Conclusions10/2018  - Left ventricle: The cavity size was normal. There was mild concentric  hypertrophy. Systolic function was normal. The estimated ejection fraction was in the range of 55% to 60%. Wall motion was normal; there were no regional wall motion abnormalities. The study is not technically sufficient to allow evaluation of LV diastolic function. - Aortic valve: Transvalvular velocity was within the normal range. There was no stenosis. There was no regurgitation. - Mitral valve: Mildly calcified annulus. Transvalvular velocity was within the normal range. There was no evidence for stenosis. There was no regurgitation. - Left atrium: The atrium was severely dilated. - Right ventricle: The cavity size was normal. Wall thickness was normal. Systolic function was normal. - Tricuspid valve: There was mild regurgitation. - Pulmonary arteries: Systolic pressure was within the normal range. PA peak pressure: 15 mm Hg (S). - Pericardium, extracardiac: A trivial pericardial effusion was identified.  Echo 05/26/16 EF 50-55, mild BAE, PASP 42  Renal artery duplex 10/12 Normal renal arteries bilaterally  Echo 06/17/11: mild LVH, EF 50-55%, mild LAE.   ASSESSMENT & PLAN:     1.Hypertensive heart disease with chronic diastolic congestive heart failure ([place]) - her body habitus makes it difficult to really know what her BP is - seems ok by her readings at home. Lasix refilled today.   2.Chronic diastolic heart failure ([place]) Her weightis down a few pounds.  She is not short of breath. See #3.  3.Morbid obesity ([place]) This has been the crux of her issues. She wishes to try and proceed with bariatric surgery and would prefer to do this in [facility] since she is followed by other providers and has her trach still in place. Will send message to Dr. Kaylyn Lim for his input. She could possibly be eligible at [facility] or [place]. She is trying to eat a little better - stress plays a big role for her.   4.Pulmonary hypertension ([place]) She  continues to follow with pulmonary. She uses oxygen prn.   5. Possible LBBB - on prior EKG - needs repeating when we can do in the office - she has had no active symptoms of chest pain. Breathing is surprisingly stable. Past EKGs with varying changes. Her weight would limit any type of testing. She needs aggressive risk factor modification with weight loss.   6. COVID-19 Education: The signs and symptoms of COVID-19 were discussed with the patient and how to seek care for testing (follow up with PCP or arrange E-visit).  The importance of social distancing, staying at home, hand hygiene and wearing a mask when out in public were discussed today.  Current medicines are reviewed with the patient today.  The patient does not have concerns regarding medicines other than what has been noted above.  The following changes have been made:  See above.  Labs/ tests ordered today include:   No orders of the defined types were placed in this encounter.    Disposition:   FU with *** in {gen number [id number]:[id number]} {Days to years:10300}.   Patient is agreeable to this plan and will call if any problems develop in the interim.   SignedTruitt Merle, NP  05/14/2019 2:46 PM  [place] [address] [place], [place]  [address] Phone: [phone] Fax: [phone]

## 2019-05-15 ENCOUNTER — Ambulatory Visit: Payer: Medicaid Other | Admitting: Nurse Practitioner

## 2019-07-25 ENCOUNTER — Ambulatory Visit (HOSPITAL_COMMUNITY): Payer: Medicare Other

## 2019-08-01 ENCOUNTER — Ambulatory Visit (HOSPITAL_COMMUNITY): Payer: Medicare Other

## 2019-08-06 ENCOUNTER — Other Ambulatory Visit: Payer: Self-pay

## 2019-08-06 ENCOUNTER — Other Ambulatory Visit (HOSPITAL_COMMUNITY)
Admission: RE | Admit: 2019-08-06 | Discharge: 2019-08-06 | Disposition: A | Discharge status: Home / Self Care | Payer: Medicare Other | Source: Ambulatory Visit | Attending: Acute Care | Admitting: Acute Care

## 2019-08-06 ENCOUNTER — Emergency Department: Payer: Medicare Other

## 2019-08-06 ENCOUNTER — Emergency Department (HOSPITAL_COMMUNITY): Payer: Medicare Other

## 2019-08-06 ENCOUNTER — Inpatient Hospital Stay (HOSPITAL_COMMUNITY)
Admission: EM | Admit: 2019-08-06 | Discharge: 2019-08-14 | DRG: 004 | Disposition: A | Discharge status: Home Health | Payer: Medicare Other | Attending: Critical Care Medicine | Admitting: Critical Care Medicine

## 2019-08-06 ENCOUNTER — Encounter (HOSPITAL_COMMUNITY): Payer: Self-pay | Admitting: Emergency Medicine

## 2019-08-06 DIAGNOSIS — E11649 Type 2 diabetes mellitus with hypoglycemia without coma: Secondary | ICD-10-CM | POA: Diagnosis not present

## 2019-08-06 DIAGNOSIS — Z8701 Personal history of pneumonia (recurrent): Secondary | ICD-10-CM

## 2019-08-06 DIAGNOSIS — Z20828 Contact with and (suspected) exposure to other viral communicable diseases: Secondary | ICD-10-CM | POA: Diagnosis present

## 2019-08-06 DIAGNOSIS — I5032 Chronic diastolic (congestive) heart failure: Secondary | ICD-10-CM | POA: Diagnosis present

## 2019-08-06 DIAGNOSIS — I2699 Other pulmonary embolism without acute cor pulmonale: Secondary | ICD-10-CM | POA: Diagnosis present

## 2019-08-06 DIAGNOSIS — J9622 Acute and chronic respiratory failure with hypercapnia: Secondary | ICD-10-CM | POA: Diagnosis present

## 2019-08-06 DIAGNOSIS — R0902 Hypoxemia: Secondary | ICD-10-CM | POA: Diagnosis present

## 2019-08-06 DIAGNOSIS — E876 Hypokalemia: Secondary | ICD-10-CM | POA: Diagnosis not present

## 2019-08-06 DIAGNOSIS — J189 Pneumonia, unspecified organism: Secondary | ICD-10-CM | POA: Diagnosis not present

## 2019-08-06 DIAGNOSIS — Z4659 Encounter for fitting and adjustment of other gastrointestinal appliance and device: Secondary | ICD-10-CM

## 2019-08-06 DIAGNOSIS — I130 Hypertensive heart and chronic kidney disease with heart failure and stage 1 through stage 4 chronic kidney disease, or unspecified chronic kidney disease: Secondary | ICD-10-CM | POA: Diagnosis present

## 2019-08-06 DIAGNOSIS — E1122 Type 2 diabetes mellitus with diabetic chronic kidney disease: Secondary | ICD-10-CM | POA: Diagnosis present

## 2019-08-06 DIAGNOSIS — J9621 Acute and chronic respiratory failure with hypoxia: Secondary | ICD-10-CM | POA: Diagnosis not present

## 2019-08-06 DIAGNOSIS — Z8249 Family history of ischemic heart disease and other diseases of the circulatory system: Secondary | ICD-10-CM

## 2019-08-06 DIAGNOSIS — Z930 Tracheostomy status: Secondary | ICD-10-CM

## 2019-08-06 DIAGNOSIS — G4733 Obstructive sleep apnea (adult) (pediatric): Secondary | ICD-10-CM | POA: Diagnosis present

## 2019-08-06 DIAGNOSIS — J42 Unspecified chronic bronchitis: Secondary | ICD-10-CM | POA: Diagnosis present

## 2019-08-06 DIAGNOSIS — Z79899 Other long term (current) drug therapy: Secondary | ICD-10-CM

## 2019-08-06 DIAGNOSIS — N184 Chronic kidney disease, stage 4 (severe): Secondary | ICD-10-CM | POA: Diagnosis present

## 2019-08-06 DIAGNOSIS — Z0189 Encounter for other specified special examinations: Secondary | ICD-10-CM

## 2019-08-06 DIAGNOSIS — N179 Acute kidney failure, unspecified: Secondary | ICD-10-CM | POA: Diagnosis present

## 2019-08-06 DIAGNOSIS — E66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: Secondary | ICD-10-CM | POA: Diagnosis present

## 2019-08-06 DIAGNOSIS — I2723 Pulmonary hypertension due to lung diseases and hypoxia: Secondary | ICD-10-CM | POA: Diagnosis present

## 2019-08-06 DIAGNOSIS — J9600 Acute respiratory failure, unspecified whether with hypoxia or hypercapnia: Secondary | ICD-10-CM

## 2019-08-06 DIAGNOSIS — Z01812 Encounter for preprocedural laboratory examination: Secondary | ICD-10-CM | POA: Insufficient documentation

## 2019-08-06 DIAGNOSIS — J383 Other diseases of vocal cords: Secondary | ICD-10-CM | POA: Diagnosis present

## 2019-08-06 LAB — CBC WITH DIFFERENTIAL/PLATELET
Abs Immature Granulocytes: 0.02 10*3/uL (ref 0.00–0.07)
Basophils Absolute: 0 10*3/uL (ref 0.0–0.1)
Basophils Relative: 1 %
Eosinophils Absolute: 0.2 10*3/uL (ref 0.0–0.5)
Eosinophils Relative: 3 %
HCT: 48.9 % — ABNORMAL HIGH (ref 36.0–46.0)
Hemoglobin: 14.5 g/dL (ref 12.0–15.0)
Immature Granulocytes: 0 %
Lymphocytes Relative: 11 %
Lymphs Abs: 0.6 10*3/uL — ABNORMAL LOW (ref 0.7–4.0)
MCH: 28.5 pg (ref 26.0–34.0)
MCHC: 29.7 g/dL — ABNORMAL LOW (ref 30.0–36.0)
MCV: 96.3 fL (ref 80.0–100.0)
Monocytes Absolute: 0.5 10*3/uL (ref 0.1–1.0)
Monocytes Relative: 9 %
Neutro Abs: 4.4 10*3/uL (ref 1.7–7.7)
Neutrophils Relative %: 76 %
Platelets: 195 10*3/uL (ref 150–400)
RBC: 5.08 MIL/uL (ref 3.87–5.11)
RDW: 18.9 % — ABNORMAL HIGH (ref 11.5–15.5)
WBC: 5.7 10*3/uL (ref 4.0–10.5)
nRBC: 1.2 % — ABNORMAL HIGH (ref 0.0–0.2)

## 2019-08-06 LAB — COMPREHENSIVE METABOLIC PANEL
ALT: 17 U/L (ref 0–44)
AST: 17 U/L (ref 15–41)
Albumin: 3.2 g/dL — ABNORMAL LOW (ref 3.5–5.0)
Alkaline Phosphatase: 77 U/L (ref 38–126)
Anion gap: 12 (ref 5–15)
BUN: 19 mg/dL (ref 6–20)
CO2: 27 mmol/L (ref 22–32)
Calcium: 8.6 mg/dL — ABNORMAL LOW (ref 8.9–10.3)
Chloride: 100 mmol/L (ref 98–111)
Creatinine, Ser: 1.43 mg/dL — ABNORMAL HIGH (ref 0.44–1.00)
GFR calc Af Amer: 53 mL/min — ABNORMAL LOW (ref >60)
GFR calc non Af Amer: 46 mL/min — ABNORMAL LOW (ref >60)
Glucose, Bld: 136 mg/dL — ABNORMAL HIGH (ref 70–99)
Potassium: 4 mmol/L (ref 3.5–5.1)
Sodium: 139 mmol/L (ref 135–145)
Total Bilirubin: 0.6 mg/dL (ref 0.3–1.2)
Total Protein: 7.7 g/dL (ref 6.5–8.1)

## 2019-08-06 LAB — POCT I-STAT 7, (LYTES, BLD GAS, ICA,H+H)
Acid-Base Excess: 3 mmol/L — ABNORMAL HIGH (ref 0.0–2.0)
Bicarbonate: 31.5 mmol/L — ABNORMAL HIGH (ref 20.0–28.0)
Calcium, Ion: 1.23 mmol/L (ref 1.15–1.40)
HCT: 48 % — ABNORMAL HIGH (ref 36.0–46.0)
Hemoglobin: 16.3 g/dL — ABNORMAL HIGH (ref 12.0–15.0)
O2 Saturation: 45 %
Patient temperature: 98
Potassium: 4.1 mmol/L (ref 3.5–5.1)
Sodium: 141 mmol/L (ref 135–145)
TCO2: 33 mmol/L — ABNORMAL HIGH (ref 22–32)
pCO2 arterial: 61.6 mmHg — ABNORMAL HIGH (ref 32.0–48.0)
pH, Arterial: 7.315 — ABNORMAL LOW (ref 7.350–7.450)
pO2, Arterial: 27 mmHg — CL (ref 83.0–108.0)

## 2019-08-06 LAB — HCG, SERUM, QUALITATIVE: Preg, Serum: NEGATIVE

## 2019-08-06 LAB — D-DIMER, QUANTITATIVE: D-Dimer, Quant: 3.27 ug/mL-FEU — ABNORMAL HIGH (ref 0.00–0.50)

## 2019-08-06 LAB — TROPONIN I (HIGH SENSITIVITY)
Troponin I (High Sensitivity): 12 ng/L (ref <18)
Troponin I (High Sensitivity): 14 ng/L (ref <18)

## 2019-08-06 LAB — BRAIN NATRIURETIC PEPTIDE: B Natriuretic Peptide: 682.6 pg/mL — ABNORMAL HIGH (ref 0.0–100.0)

## 2019-08-06 LAB — SARS CORONAVIRUS 2 (TAT 6-24 HRS): SARS Coronavirus 2: NEGATIVE

## 2019-08-06 LAB — RESPIRATORY PANEL BY RT PCR (FLU A&B, COVID)
Influenza A by PCR: NEGATIVE
Influenza B by PCR: NEGATIVE
SARS Coronavirus 2 by RT PCR: NEGATIVE

## 2019-08-06 LAB — POC SARS CORONAVIRUS 2 AG -  ED: SARS Coronavirus 2 Ag: NEGATIVE

## 2019-08-06 LAB — LACTIC ACID, PLASMA: Lactic Acid, Venous: 1.4 mmol/L (ref 0.5–1.9)

## 2019-08-06 MED ORDER — HEPARIN (PORCINE) 25000 UT/250ML-% IV SOLN
1900.0000 [IU]/h | INTRAVENOUS | Status: DC
  Administered 2019-08-07: 02:00:00 1900 [IU]/h via INTRAVENOUS

## 2019-08-06 MED ORDER — HEPARIN BOLUS VIA INFUSION
6000.0000 [IU] | Freq: Once | INTRAVENOUS | Status: AC
  Administered 2019-08-07: 6000 [IU] via INTRAVENOUS
  Filled 2019-08-06: qty 6000

## 2019-08-06 MED ORDER — SODIUM CHLORIDE 0.9 % IV SOLN
2.0000 g | Freq: Once | INTRAVENOUS | Status: AC
  Administered 2019-08-06: 2 g via INTRAVENOUS
  Filled 2019-08-06: qty 2

## 2019-08-06 MED ORDER — IOHEXOL 350 MG/ML SOLN
75.0000 mL | Freq: Once | INTRAVENOUS | Status: AC | PRN
  Administered 2019-08-06: 75 mL via INTRAVENOUS

## 2019-08-06 MED ORDER — VANCOMYCIN HCL IN DEXTROSE 1-5 GM/200ML-% IV SOLN
1000.0000 mg | Freq: Two times a day (BID) | INTRAVENOUS | Status: DC
  Administered 2019-08-07: 1000 mg via INTRAVENOUS
  Filled 2019-08-06: qty 200

## 2019-08-06 MED ORDER — VANCOMYCIN HCL 10 G IV SOLR
2500.0000 mg | Freq: Once | INTRAVENOUS | Status: AC
  Administered 2019-08-06: 2500 mg via INTRAVENOUS
  Filled 2019-08-06: qty 2500

## 2019-08-06 NOTE — Progress Notes (Signed)
RT called due to SATs on monitor reading in the 30s. Pt not noted to be in distress but good waveform with SATs. Positive color change on CO2. Clear, bilateral breath sounds. ABG obtained and shown below. PaO2 27. Pt placed on Trach collar 10L 100% and bag-lavaged and suctioned. Was able to obtain moderate amount of thick, tenacious, yellow/tan mucous plugs. SATs on monitor now reading 91%. CXR ordered and MD aware. RN aware of ABG results.  Results for [name] [name], [name] [name] (MRN [id number]) as of 08/06/2019 18:48  Ref. Range 08/06/2019 18:28  Sample type Unknown ARTERIAL  pH, Arterial Latest Ref Range: 7.350 - 7.450  7.315 (L)  pCO2 arterial Latest Ref Range: 32.0 - 48.0 mmHg 61.6 (H)  pO2, Arterial Latest Ref Range: 83.0 - 108.0 mmHg 27.0 (LL)  TCO2 Latest Ref Range: 22 - 32 mmol/L 33 (H)  Acid-Base Excess Latest Ref Range: 0.0 - 2.0 mmol/L 3.0 (H)  Bicarbonate Latest Ref Range: 20.0 - 28.0 mmol/L 31.5 (H)  O2 Saturation Latest Units: % 45.0  Patient temperature Unknown 98.0 F  Sodium Latest Ref Range: 135 - 145 mmol/L 141  Potassium Latest Ref Range: 3.5 - 5.1 mmol/L 4.1  Calcium Ionized Latest Ref Range: 1.15 - 1.40 mmol/L 1.23  Hemoglobin Latest Ref Range: 12.0 - 15.0 g/dL 16.3 (H)  HCT Latest Ref Range: 36.0 - 46.0 % 48.0 (H)

## 2019-08-06 NOTE — Progress Notes (Addendum)
ANTICOAGULATION CONSULT NOTE - Initial Consult  Pharmacy Consult for heparin Indication: pulmonary embolus  Allergies  Allergen Reactions  . No Known Allergies     Patient Measurements: Height: _0  (165.1 cm) Weight: (!) 399 lb 7.6 oz (181.2 kg) IBW/kg (Calculated) : 57 Heparin Dosing Weight: 105kg  Vital Signs: BP: 104/83 (12/14 2329) Pulse Rate: 87 (12/14 2329)  Labs: Recent Labs    08/06/19 1828 08/06/19 1833 08/06/19 2159  HGB 16.3* 14.5  --   HCT 48.0* 48.9*  --   PLT  --  195  --   CREATININE  --  1.43*  --   TROPONINIHS  --  12 14    Estimated Creatinine Clearance: 88.1 mL/min (A) (by C-G formula based on SCr of 1.43 mg/dL (H)).   Medical History: Past Medical History:  Diagnosis Date  . Chronic diastolic heart failure ([place])    Echo 10/12: EF 50-55 // b. Echo 10/17: EF 50-55, mild BAE, PASP 42 // Echo 10/18: Mild concentric LVH, EF 55-60, normal wall motion, mild MAC, severe LAE, mild TR, PASP 15, trivial pericardial effusion  . CKD (chronic kidney disease) stage 4, GFR 15-29 ml/min (HCC) 10/25/2016   admitted in 2/18 with SCr of 10 in the setting of dehydration from illness, etc >> improved to 5 at DC  . DM2 (diabetes mellitus, type 2) (HCC)    A1c 7.2 in 05/2016  . Gestational diabetes mellitus in pregnancy 05/24/2013  . HTN in pregnancy, chronic 05/24/2013  . Hypertensive heart disease with CHF (congestive heart failure) ([place]) 05/25/2016  . Morbid obesity ([place])   . OSA (obstructive sleep apnea) 07/09/2011   s/p Trach 2/2 prolonged respiratory failure during admx for CHF in 05/2016  . Pulmonary hypertension ([place])    secondary from CHF/OSA  . Sinus tachycardia     Assessment: 38yo female w/ trach presents w/ O2 sats in 30s at home, initially tx'd for PNA but found to have possible PE on CT (though limitations on exam), to begin heparin.  Goal of Therapy:  Heparin level 0.3-0.7 units/ml Monitor platelets by anticoagulation protocol: Yes   Plan:   Will give heparin 6000 units IV bolus x1 followed by gtt at 1900 units/hr and monitor heparin levels and CBC.  Wynona Neat, PharmD, BCPS  08/06/2019,11:44 PM

## 2019-08-06 NOTE — ED Triage Notes (Signed)
Pt here from home with a trach with low sats reading in the 30's with a good wave form , pt is still able to talk in full sentences , pt was tested out pt for covid  Today

## 2019-08-06 NOTE — Progress Notes (Signed)
Pharmacy Antibiotic Note  [name] [name] is a 38 y.o. female admitted on 08/06/2019 with pneumonia.  Pharmacy has been consulted for vancomycin dosing. Also with cefepime ordered x 1 per EDP. SCr 1.43 on admit. Estimated heigh 5'5" and weight 400 lbs in the ED per discussion with RN.  Plan: Vancomycin 2557m IV x 1; Vancomycin 1000 mg IV Q 12 hrs. Goal AUC 400-550. Expected AUC: 509 SCr used: 1.43 Cefepime 2g IV x 1 per EDP - f/u if to continue Monitor clinical progress, c/s, renal function F/u de-escalation plan/LOT, vancomycin levels at steady state      No data recorded.  Recent Labs  Lab 08/06/19 1826 08/06/19 1833  WBC  --  5.7  CREATININE  --  1.43*  LATICACIDVEN 1.4  --     CrCl cannot be calculated (Unknown ideal weight.).    Allergies  Allergen Reactions  . No Known Allergies     HElicia Lamp PharmD, BCPS Please check AMION for all MGallitzincontact numbers Clinical Pharmacist 08/06/2019 8:25 PM

## 2019-08-06 NOTE — ED Provider Notes (Addendum)
[facility] EMERGENCY DEPARTMENT Provider Note   CSN: [id number] Arrival date & time: 08/06/19  1756     History No chief complaint on file.   [name] [name] is a 38 y.o. female.  Presents emerged part with chief complaints of low oxygen.  Patient states over the past couple weeks she has noted some shortness of breath, but no cough, no congestion, no fevers.  Has had some decreased energy associated with this but no myalgias.  No sick contacts.  Went to to get a Covid test in anticipation of a trach change, while there, they noted her oxygen saturation was 60% and said she should go to the ER for reassessment.  Patient has significant history of severe obstructive sleep apnea requiring trach placement, currently slowly downsizing trach in hopes of getting trach removed.  HPI     Past Medical History:  Diagnosis Date  . Chronic diastolic heart failure ([place])    Echo 10/12: EF 50-55 // b. Echo 10/17: EF 50-55, mild BAE, PASP 42 // Echo 10/18: Mild concentric LVH, EF 55-60, normal wall motion, mild MAC, severe LAE, mild TR, PASP 15, trivial pericardial effusion  . CKD (chronic kidney disease) stage 4, GFR 15-29 ml/min (HCC) 10/25/2016   admitted in 2/18 with SCr of 10 in the setting of dehydration from illness, etc >> improved to 5 at DC  . DM2 (diabetes mellitus, type 2) (HCC)    A1c 7.2 in 05/2016  . Gestational diabetes mellitus in pregnancy 05/24/2013  . HTN in pregnancy, chronic 05/24/2013  . Hypertensive heart disease with CHF (congestive heart failure) ([place]) 05/25/2016  . Morbid obesity ([place])   . OSA (obstructive sleep apnea) 07/09/2011   s/p Trach 2/2 prolonged respiratory failure during admx for CHF in 05/2016  . Pulmonary hypertension ([place])    secondary from CHF/OSA  . Sinus tachycardia     Patient Active Problem List   Diagnosis Date Noted  . Tracheostomy care ([place])   . (HFpEF) heart failure with preserved ejection fraction ([place])   . Hypertension     . Peripheral edema   . Tracheostomy dependence ([place])   . Purulent bronchitis ([place])   . CKD (chronic kidney disease) stage 4, GFR 15-29 ml/min (HCC) 10/25/2016  . Acute renal failure superimposed on stage 3 chronic kidney disease ([place]) 09/15/2016  . Pneumonitis 09/15/2016  . Abnormal urinalysis 09/15/2016  . Dysphagia   . Urinary retention   . Hypoalbuminemia due to protein-calorie malnutrition ([place])   . Debilitated 06/29/2016  . Demand ischemia ([place])   . Moderate single current episode of major depressive disorder ([place])   . Tracheostomy status ([place])   . Chronic diastolic heart failure ([place])   . Obesity hypoventilation syndrome ([place])   . Chronic respiratory failure with hypercapnia ([place])   . HCAP (healthcare-associated pneumonia)   . Sinus pause 06/07/2016  . Difficult intravenous access   . Hepatic congestion 05/26/2016  . Sinus tachycardia 05/26/2016  . Pericardial effusion 05/26/2016  . Pulmonary hypertension ([place])   . Renal insufficiency 05/25/2016  . Abnormal EKG 05/25/2016  . Abdominal pain 05/25/2016  . Hypertensive heart disease with CHF (congestive heart failure) ([place]) 05/25/2016  . Oligohydramnios, delivered 05/27/2013  . NSVD (normal spontaneous vaginal delivery) 05/27/2013  . OSA (obstructive sleep apnea) 07/09/2011  . Morbid obesity ([place]) 05/18/2011    Past Surgical History:  Procedure Laterality Date  . TRACHEOSTOMY TUBE PLACEMENT N/A 06/11/2016   Procedure: TRACHEOSTOMY;  Surgeon: Melida Quitter, MD;  Location: [facility]  OR;  Service: ENT;  Laterality: N/A;     OB History    Gravida  1   Para  1   Term  1   Preterm  0   AB  0   Living  1     SAB  0   TAB  0   Ectopic  0   Multiple  0   Live Births  1           Family History  Problem Relation Age of Onset  . Hypertension Mother   . Diabetes Other   . Birth defects Paternal Grandfather        unknown type    Social History   Tobacco Use  . Smoking status: Never Smoker  . Smokeless  tobacco: Never Used  Substance Use Topics  . Alcohol use: No  . Drug use: No    Home Medications Prior to Admission medications   Medication Sig Start Date End Date Taking? Authorizing Provider  amLODipine (NORVASC) 10 MG tablet Take 1 tablet (10 mg total) by mouth daily. 09/20/18   Burtis Junes, NP  carvedilol (COREG) 25 MG tablet Take 1 tablet (25 mg total) by mouth 2 (two) times daily. 09/20/18 12/19/18  Burtis Junes, NP  fluticasone (FLONASE) 50 MCG/ACT nasal spray Place 2 sprays into both nostrils 2 (two) times daily. 11/08/18   Erick Colace, NP  furosemide (LASIX) 40 MG tablet Take 1 tablet (40 mg total) by mouth daily. 12/18/18   Burtis Junes, NP  hydrALAZINE (APRESOLINE) 100 MG tablet TAKE 1 TABLET BY MOUTH THREE TIMES A DAY 04/23/19   Burtis Junes, NP  isosorbide dinitrate (ISORDIL) 30 MG tablet Take 1 tablet (30 mg total) by mouth 3 (three) times daily. 09/20/18   Burtis Junes, NP  lisinopril (PRINIVIL,ZESTRIL) 40 MG tablet Take 1 tablet (40 mg total) by mouth daily. 09/20/18 12/19/18  Burtis Junes, NP  nystatin ointment (MYCOSTATIN) Apply topically 2 (two) times daily. 01/05/19   Erick Colace, NP    Allergies    No known allergies  Review of Systems   Review of Systems  Constitutional: Negative for chills and fever.  HENT: Negative for ear pain and sore throat.   Eyes: Negative for pain and visual disturbance.  Respiratory: Positive for shortness of breath. Negative for cough.   Cardiovascular: Negative for chest pain and palpitations.  Gastrointestinal: Negative for abdominal pain and vomiting.  Genitourinary: Negative for dysuria and hematuria.  Musculoskeletal: Negative for arthralgias and back pain.  Skin: Negative for color change and rash.  Neurological: Negative for seizures and syncope.  All other systems reviewed and are negative.   Physical Exam Updated Vital Signs BP 104/83   Pulse 87   Resp 20   SpO2 (!) 74%   Physical  Exam Vitals and nursing note reviewed.  Constitutional:      General: She is not in acute distress.    Appearance: She is well-developed.     Comments: Morbidly obese but no acute distress  HENT:     Head: Normocephalic and atraumatic.  Eyes:     Conjunctiva/sclera: Conjunctivae normal.  Neck:     Comments: Trach site is C/D/I Cardiovascular:     Rate and Rhythm: Normal rate and regular rhythm.     Heart sounds: No murmur.  Pulmonary:     Effort: Pulmonary effort is normal. No respiratory distress.     Breath sounds: Normal breath sounds.  Comments: Mild tachypnea, clear breath sounds Abdominal:     Palpations: Abdomen is soft.     Tenderness: There is no abdominal tenderness.  Musculoskeletal:     Cervical back: Neck supple.  Skin:    General: Skin is warm and dry.     Capillary Refill: Capillary refill takes less than 2 seconds.     Coloration: Skin is pale.  Neurological:     General: No focal deficit present.     Mental Status: She is alert and oriented to person, place, and time.     ED Results / Procedures / Treatments   Labs (all labs ordered are listed, but only abnormal results are displayed) Labs Reviewed  CBC WITH DIFFERENTIAL/PLATELET - Abnormal; Notable for the following components:      Result Value   HCT 48.9 (*)    MCHC 29.7 (*)    RDW 18.9 (*)    nRBC 1.2 (*)    Lymphs Abs 0.6 (*)    All other components within normal limits  COMPREHENSIVE METABOLIC PANEL - Abnormal; Notable for the following components:   Glucose, Bld 136 (*)    Creatinine, Ser 1.43 (*)    Calcium 8.6 (*)    Albumin 3.2 (*)    GFR calc non Af Amer 46 (*)    GFR calc Af Amer 53 (*)    All other components within normal limits  D-DIMER, QUANTITATIVE (NOT AT [facility]) - Abnormal; Notable for the following components:   D-Dimer, Quant 3.27 (*)    All other components within normal limits  BRAIN NATRIURETIC PEPTIDE - Abnormal; Notable for the following components:   B Natriuretic  Peptide 682.6 (*)    All other components within normal limits  POCT I-STAT 7, (LYTES, BLD GAS, ICA,H+H) - Abnormal; Notable for the following components:   pH, Arterial 7.315 (*)    pCO2 arterial 61.6 (*)    pO2, Arterial 27.0 (*)    Bicarbonate 31.5 (*)    TCO2 33 (*)    Acid-Base Excess 3.0 (*)    HCT 48.0 (*)    Hemoglobin 16.3 (*)    All other components within normal limits  RESPIRATORY PANEL BY RT PCR (FLU A&B, COVID)  LACTIC ACID, PLASMA  HCG, SERUM, QUALITATIVE  BLOOD GAS, ARTERIAL  POC SARS CORONAVIRUS 2 AG -  ED  TROPONIN I (HIGH SENSITIVITY)  TROPONIN I (HIGH SENSITIVITY)    EKG 12/14 at 1853 sinus rhythm, rate 82 Prolonged PR interval LAE, consider biatrial enlargement Right bundle branch block ST elev, probable normal early repol pattern   None  Radiology DG Chest Portable 1 View  Result Date: 08/06/2019 CLINICAL DATA:  Shortness of breath and congestion EXAM: PORTABLE CHEST 1 VIEW COMPARISON:  September 15, 2016 FINDINGS: Tracheostomy catheter tip is 6.3 cm above the carina. No pneumothorax. There is left lower lobe consolidation with small left pleural effusion. There is cardiomegaly with pulmonary venous hypertension. No adenopathy. There is aortic atherosclerosis. No bone lesions. IMPRESSION: Tracheostomy as described without pneumothorax. Consolidation, most likely due to pneumonia, left lower lobe with small left pleural effusion. There is cardiomegaly with pulmonary vascular congestion. No frank interstitial edema. Electronically Signed   By: Lowella Grip III M.D.   On: 08/06/2019 18:53    Procedures .Critical Care Performed by: Lucrezia Starch, MD Authorized by: Lucrezia Starch, MD   Critical care provider statement:    Critical care time (minutes):  55   Critical care was necessary to treat or  prevent imminent or life-threatening deterioration of the following conditions:  Respiratory failure   Critical care was time spent personally by  me on the following activities:  Discussions with consultants, evaluation of patient's response to treatment, examination of patient, ordering and performing treatments and interventions, ordering and review of laboratory studies, ordering and review of radiographic studies, pulse oximetry, re-evaluation of patient's condition, obtaining history from patient or surrogate and review of old charts   (including critical care time)  Medications Ordered in ED Medications  vancomycin (VANCOCIN) 2,500 mg in sodium chloride 0.9 % 500 mL IVPB (has no administration in time range)  vancomycin (VANCOCIN) IVPB 1000 mg/200 mL premix (has no administration in time range)  ceFEPIme (MAXIPIME) 2 g in sodium chloride 0.9 % 100 mL IVPB ( Intravenous Restarted 08/06/19 2331)  iohexol (OMNIPAQUE) 350 MG/ML injection 75 mL (75 mLs Intravenous Contrast Given 08/06/19 2306)    ED Course  I have reviewed the triage vital signs and the nursing notes.  Pertinent labs & imaging results that were available during my care of the patient were reviewed by me and considered in my medical decision making (see chart for details).    MDM Rules/Calculators/A&P                      38 year old lady presented to ER with chief complaint of low oxygen saturation, hypoxia.  On exam patient was actually relatively well-appearing, not in respiratory distress despite her profound hypoxia.  ABG confirmed concern for profound hypoxia.  Work-up today demonstrated negative Covid, chest x-ray with question of pneumonia.  Had started antibiotics initially.  D-dimer elevated, check CTA to evaluate for PE.  PE study limited due to patient's motion and body habitus however radiologist felt she most likely has right-sided pulmonary embolism.  Will consult hospitalist for admission, initiate heparin drip.  Patient's oxygen improved dramatically after starting on trach collar, she remained with oxygen saturations in the low 90s, blood pressure stable  throughout.   Final Clinical Impression(s) / ED Diagnoses Final diagnoses:  Acute pulmonary embolism without acute cor pulmonale, unspecified pulmonary embolism type [facility])  Hypoxia    Rx / DC Orders ED Discharge Orders    None       Lucrezia Starch, MD 08/06/19 2341  09/05/19 addendum to include ekg read that was performed on 12/14    Lucrezia Starch, MD 09/05/19 1054

## 2019-08-06 NOTE — ED Notes (Signed)
Patient IV is not compatible with giving antibiotics or fluid.  New IV attempted x2.  2nd RN to attempt after return from CT

## 2019-08-07 ENCOUNTER — Inpatient Hospital Stay (HOSPITAL_COMMUNITY): Payer: Medicare Other

## 2019-08-07 DIAGNOSIS — J9602 Acute respiratory failure with hypercapnia: Secondary | ICD-10-CM | POA: Diagnosis not present

## 2019-08-07 DIAGNOSIS — I130 Hypertensive heart and chronic kidney disease with heart failure and stage 1 through stage 4 chronic kidney disease, or unspecified chronic kidney disease: Secondary | ICD-10-CM | POA: Diagnosis present

## 2019-08-07 DIAGNOSIS — I272 Pulmonary hypertension, unspecified: Secondary | ICD-10-CM | POA: Diagnosis not present

## 2019-08-07 DIAGNOSIS — Z930 Tracheostomy status: Secondary | ICD-10-CM | POA: Diagnosis not present

## 2019-08-07 DIAGNOSIS — G4733 Obstructive sleep apnea (adult) (pediatric): Secondary | ICD-10-CM | POA: Diagnosis present

## 2019-08-07 DIAGNOSIS — J42 Unspecified chronic bronchitis: Secondary | ICD-10-CM | POA: Diagnosis present

## 2019-08-07 DIAGNOSIS — J9621 Acute and chronic respiratory failure with hypoxia: Secondary | ICD-10-CM | POA: Diagnosis present

## 2019-08-07 DIAGNOSIS — J9509 Other tracheostomy complication: Secondary | ICD-10-CM | POA: Diagnosis not present

## 2019-08-07 DIAGNOSIS — E66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: Secondary | ICD-10-CM | POA: Diagnosis present

## 2019-08-07 DIAGNOSIS — Z8701 Personal history of pneumonia (recurrent): Secondary | ICD-10-CM | POA: Diagnosis not present

## 2019-08-07 DIAGNOSIS — J9622 Acute and chronic respiratory failure with hypercapnia: Secondary | ICD-10-CM | POA: Diagnosis present

## 2019-08-07 DIAGNOSIS — Z20828 Contact with and (suspected) exposure to other viral communicable diseases: Secondary | ICD-10-CM | POA: Diagnosis present

## 2019-08-07 DIAGNOSIS — I5032 Chronic diastolic (congestive) heart failure: Secondary | ICD-10-CM | POA: Diagnosis present

## 2019-08-07 DIAGNOSIS — R0902 Hypoxemia: Secondary | ICD-10-CM | POA: Diagnosis present

## 2019-08-07 DIAGNOSIS — N184 Chronic kidney disease, stage 4 (severe): Secondary | ICD-10-CM | POA: Diagnosis present

## 2019-08-07 DIAGNOSIS — J9601 Acute respiratory failure with hypoxia: Secondary | ICD-10-CM | POA: Diagnosis not present

## 2019-08-07 DIAGNOSIS — J189 Pneumonia, unspecified organism: Secondary | ICD-10-CM | POA: Diagnosis not present

## 2019-08-07 DIAGNOSIS — Z79899 Other long term (current) drug therapy: Secondary | ICD-10-CM | POA: Diagnosis not present

## 2019-08-07 DIAGNOSIS — I2699 Other pulmonary embolism without acute cor pulmonale: Secondary | ICD-10-CM | POA: Diagnosis present

## 2019-08-07 DIAGNOSIS — J383 Other diseases of vocal cords: Secondary | ICD-10-CM | POA: Diagnosis present

## 2019-08-07 DIAGNOSIS — Z8249 Family history of ischemic heart disease and other diseases of the circulatory system: Secondary | ICD-10-CM | POA: Diagnosis not present

## 2019-08-07 DIAGNOSIS — I2723 Pulmonary hypertension due to lung diseases and hypoxia: Secondary | ICD-10-CM | POA: Diagnosis present

## 2019-08-07 DIAGNOSIS — N179 Acute kidney failure, unspecified: Secondary | ICD-10-CM | POA: Diagnosis present

## 2019-08-07 DIAGNOSIS — I82409 Acute embolism and thrombosis of unspecified deep veins of unspecified lower extremity: Secondary | ICD-10-CM

## 2019-08-07 DIAGNOSIS — E876 Hypokalemia: Secondary | ICD-10-CM | POA: Diagnosis not present

## 2019-08-07 DIAGNOSIS — E11649 Type 2 diabetes mellitus with hypoglycemia without coma: Secondary | ICD-10-CM | POA: Diagnosis not present

## 2019-08-07 DIAGNOSIS — E1122 Type 2 diabetes mellitus with diabetic chronic kidney disease: Secondary | ICD-10-CM | POA: Diagnosis present

## 2019-08-07 LAB — POCT I-STAT 7, (LYTES, BLD GAS, ICA,H+H)
Acid-Base Excess: 1 mmol/L (ref 0.0–2.0)
Acid-Base Excess: 5 mmol/L — ABNORMAL HIGH (ref 0.0–2.0)
Bicarbonate: 31.6 mmol/L — ABNORMAL HIGH (ref 20.0–28.0)
Bicarbonate: 34.3 mmol/L — ABNORMAL HIGH (ref 20.0–28.0)
Calcium, Ion: 1.23 mmol/L (ref 1.15–1.40)
Calcium, Ion: 1.18 mmol/L (ref 1.15–1.40)
HCT: 45 % (ref 36.0–46.0)
HCT: 44 % (ref 36.0–46.0)
Hemoglobin: 15.3 g/dL — ABNORMAL HIGH (ref 12.0–15.0)
Hemoglobin: 15 g/dL (ref 12.0–15.0)
O2 Saturation: 92 %
O2 Saturation: 100 %
Patient temperature: 98.6
Patient temperature: 98
Potassium: 4.2 mmol/L (ref 3.5–5.1)
Potassium: 3.9 mmol/L (ref 3.5–5.1)
Sodium: 142 mmol/L (ref 135–145)
Sodium: 143 mmol/L (ref 135–145)
TCO2: 34 mmol/L — ABNORMAL HIGH (ref 22–32)
TCO2: 36 mmol/L — ABNORMAL HIGH (ref 22–32)
pCO2 arterial: 82.2 mmHg (ref 32.0–48.0)
pCO2 arterial: 71.6 mmHg (ref 32.0–48.0)
pH, Arterial: 7.193 — CL (ref 7.350–7.450)
pH, Arterial: 7.286 — ABNORMAL LOW (ref 7.350–7.450)
pO2, Arterial: 84 mmHg (ref 83.0–108.0)
pO2, Arterial: 195 mmHg — ABNORMAL HIGH (ref 83.0–108.0)

## 2019-08-07 LAB — CBC
HCT: 48.3 % — ABNORMAL HIGH (ref 36.0–46.0)
Hemoglobin: 14 g/dL (ref 12.0–15.0)
MCH: 27.8 pg (ref 26.0–34.0)
MCHC: 29 g/dL — ABNORMAL LOW (ref 30.0–36.0)
MCV: 95.8 fL (ref 80.0–100.0)
Platelets: 182 10*3/uL (ref 150–400)
RBC: 5.04 MIL/uL (ref 3.87–5.11)
RDW: 18.7 % — ABNORMAL HIGH (ref 11.5–15.5)
WBC: 5.5 10*3/uL (ref 4.0–10.5)
nRBC: 2.2 % — ABNORMAL HIGH (ref 0.0–0.2)

## 2019-08-07 LAB — BASIC METABOLIC PANEL
Anion gap: 13 (ref 5–15)
BUN: 22 mg/dL — ABNORMAL HIGH (ref 6–20)
CO2: 27 mmol/L (ref 22–32)
Calcium: 8.4 mg/dL — ABNORMAL LOW (ref 8.9–10.3)
Chloride: 104 mmol/L (ref 98–111)
Creatinine, Ser: 1.48 mg/dL — ABNORMAL HIGH (ref 0.44–1.00)
GFR calc Af Amer: 51 mL/min — ABNORMAL LOW (ref >60)
GFR calc non Af Amer: 44 mL/min — ABNORMAL LOW (ref >60)
Glucose, Bld: 90 mg/dL (ref 70–99)
Potassium: 4.8 mmol/L (ref 3.5–5.1)
Sodium: 144 mmol/L (ref 135–145)

## 2019-08-07 LAB — MAGNESIUM: Magnesium: 2.3 mg/dL (ref 1.7–2.4)

## 2019-08-07 LAB — HIV ANTIBODY (ROUTINE TESTING W REFLEX): HIV Screen 4th Generation wRfx: NONREACTIVE

## 2019-08-07 LAB — ECHOCARDIOGRAM COMPLETE
Height: 65 in
Weight: 6400 oz

## 2019-08-07 LAB — MRSA PCR SCREENING: MRSA by PCR: NEGATIVE

## 2019-08-07 LAB — HEPARIN LEVEL (UNFRACTIONATED)
Heparin Unfractionated: 0.34 IU/mL (ref 0.30–0.70)
Heparin Unfractionated: 0.17 IU/mL — ABNORMAL LOW (ref 0.30–0.70)

## 2019-08-07 LAB — GLUCOSE, CAPILLARY: Glucose-Capillary: 84 mg/dL (ref 70–99)

## 2019-08-07 LAB — PHOSPHORUS: Phosphorus: 4.6 mg/dL (ref 2.5–4.6)

## 2019-08-07 LAB — PROCALCITONIN: Procalcitonin: 0.22 ng/mL

## 2019-08-07 MED ORDER — TECHNETIUM TO 99M ALBUMIN AGGREGATED
1.5500 | Freq: Once | INTRAVENOUS | Status: AC | PRN
  Administered 2019-08-07: 15:00:00 1.55 via INTRAVENOUS

## 2019-08-07 MED ORDER — MIDAZOLAM HCL 2 MG/2ML IJ SOLN
INTRAMUSCULAR | Status: AC
  Administered 2019-08-07: 11:00:00 2 mg
  Filled 2019-08-07: qty 2

## 2019-08-07 MED ORDER — CHLORHEXIDINE GLUCONATE 0.12% ORAL RINSE (MEDLINE KIT)
15.0000 mL | Freq: Two times a day (BID) | OROMUCOSAL | Status: DC
  Administered 2019-08-07 – 2019-08-12 (×11): 15 mL via OROMUCOSAL

## 2019-08-07 MED ORDER — KETAMINE HCL 50 MG/5ML IJ SOSY
PREFILLED_SYRINGE | INTRAMUSCULAR | Status: AC
  Filled 2019-08-07: qty 5

## 2019-08-07 MED ORDER — MIDAZOLAM HCL 2 MG/2ML IJ SOLN: 1.0000 mg | INTRAMUSCULAR | Status: DC | PRN

## 2019-08-07 MED ORDER — SUCCINYLCHOLINE CHLORIDE 20 MG/ML IJ SOLN
100.0000 mg | Freq: Once | INTRAMUSCULAR | Status: AC
  Administered 2019-08-07: 100 mg via INTRAVENOUS

## 2019-08-07 MED ORDER — SODIUM CHLORIDE 0.9 % IV SOLN
2.0000 g | Freq: Every day | INTRAVENOUS | Status: AC
  Administered 2019-08-07 – 2019-08-11 (×5): 2 g via INTRAVENOUS
  Filled 2019-08-07: qty 20
  Filled 2019-08-07 (×4): qty 2
  Filled 2019-08-07: qty 20

## 2019-08-07 MED ORDER — ORAL CARE MOUTH RINSE
15.0000 mL | OROMUCOSAL | Status: DC
  Administered 2019-08-07 – 2019-08-12 (×46): 15 mL via OROMUCOSAL

## 2019-08-07 MED ORDER — PROPOFOL 1000 MG/100ML IV EMUL
5.0000 ug/kg/min | INTRAVENOUS | Status: DC
  Administered 2019-08-07: 30 ug/kg/min via INTRAVENOUS
  Administered 2019-08-07: 50 ug/kg/min via INTRAVENOUS
  Filled 2019-08-07 (×3): qty 100

## 2019-08-07 MED ORDER — CARVEDILOL 25 MG PO TABS
25.0000 mg | ORAL_TABLET | Freq: Two times a day (BID) | ORAL | Status: DC
  Administered 2019-08-07: 25 mg via ORAL
  Filled 2019-08-07 (×2): qty 1

## 2019-08-07 MED ORDER — KETAMINE HCL 50 MG/5ML IJ SOSY
50.0000 mg | PREFILLED_SYRINGE | Freq: Once | INTRAMUSCULAR | Status: AC
  Administered 2019-08-07: 50 mg via INTRAVENOUS

## 2019-08-07 MED ORDER — ACETAMINOPHEN 325 MG PO TABS: 650.0000 mg | ORAL_TABLET | ORAL | Status: DC | PRN

## 2019-08-07 MED ORDER — HEPARIN (PORCINE) 25000 UT/250ML-% IV SOLN
2500.0000 [IU]/h | INTRAVENOUS | Status: DC
  Administered 2019-08-07: 13:00:00 2100 [IU]/h via INTRAVENOUS
  Administered 2019-08-08: 2400 [IU]/h via INTRAVENOUS
  Filled 2019-08-07 (×2): qty 250

## 2019-08-07 MED ORDER — IPRATROPIUM-ALBUTEROL 0.5-2.5 (3) MG/3ML IN SOLN
3.0000 mL | RESPIRATORY_TRACT | Status: DC
  Administered 2019-08-07 – 2019-08-10 (×23): 3 mL via RESPIRATORY_TRACT
  Filled 2019-08-07 (×23): qty 3

## 2019-08-07 MED ORDER — HYDROMORPHONE HCL 1 MG/ML IJ SOLN
1.0000 mg | INTRAMUSCULAR | Status: DC | PRN
  Administered 2019-08-07: 03:00:00 1 mg via INTRAVENOUS

## 2019-08-07 MED ORDER — HYDROMORPHONE HCL 1 MG/ML IJ SOLN
INTRAMUSCULAR | Status: AC
  Filled 2019-08-07: qty 1

## 2019-08-07 MED ORDER — CHLORHEXIDINE GLUCONATE CLOTH 2 % EX PADS
6.0000 | MEDICATED_PAD | Freq: Every day | CUTANEOUS | Status: DC
  Administered 2019-08-07 – 2019-08-12 (×7): 6 via TOPICAL

## 2019-08-07 MED ORDER — FENTANYL CITRATE (PF) 100 MCG/2ML IJ SOLN
INTRAMUSCULAR | Status: AC
  Administered 2019-08-07: 100 ug
  Filled 2019-08-07: qty 2

## 2019-08-07 MED ORDER — SODIUM CHLORIDE 0.9 % IV SOLN
100.0000 mg | Freq: Two times a day (BID) | INTRAVENOUS | Status: AC
  Administered 2019-08-07 – 2019-08-11 (×10): 100 mg via INTRAVENOUS
  Filled 2019-08-07 (×11): qty 100

## 2019-08-07 MED ORDER — ETOMIDATE 2 MG/ML IV SOLN
20.0000 mg | Freq: Once | INTRAVENOUS | Status: AC
  Administered 2019-08-07: 02:00:00 20 mg via INTRAVENOUS

## 2019-08-07 MED ORDER — FUROSEMIDE 10 MG/ML IJ SOLN
20.0000 mg | Freq: Once | INTRAMUSCULAR | Status: AC
  Administered 2019-08-07: 20 mg via INTRAVENOUS
  Filled 2019-08-07: qty 2

## 2019-08-07 MED ORDER — ONDANSETRON HCL 4 MG/2ML IJ SOLN: 4.0000 mg | Freq: Four times a day (QID) | INTRAMUSCULAR | Status: DC | PRN

## 2019-08-07 MED ORDER — PANTOPRAZOLE SODIUM 40 MG IV SOLR
40.0000 mg | Freq: Every day | INTRAVENOUS | Status: DC
  Administered 2019-08-07 – 2019-08-08 (×2): 40 mg via INTRAVENOUS
  Filled 2019-08-07 (×2): qty 40

## 2019-08-07 MED ORDER — PROPOFOL 1000 MG/100ML IV EMUL
INTRAVENOUS | Status: AC
  Filled 2019-08-07: qty 100

## 2019-08-07 MED ORDER — SODIUM CHLORIDE 0.9 % IV SOLN
INTRAVENOUS | Status: DC | PRN
  Administered 2019-08-07: 06:00:00 1000 mL via INTRAVENOUS

## 2019-08-07 NOTE — ED Notes (Signed)
Patient needing intubation at this time, Dr Dina Rich at bedside to intubate.

## 2019-08-07 NOTE — Progress Notes (Signed)
ANTICOAGULATION CONSULT NOTE - [place] for heparin Indication: pulmonary embolus  Allergies  Allergen Reactions  . No Known Allergies     Patient Measurements: Height: 5' 5" (165.1 cm) Weight: (!) 400 lb (181.4 kg) IBW/kg (Calculated) : 57 Heparin Dosing Weight: 105kg  Vital Signs: Temp: 99.7 F (37.6 C) (12/15 1939) Temp Source: Oral (12/15 1939) BP: 166/93 (12/15 1845) Pulse Rate: 90 (12/15 1845)  Labs: Recent Labs    08/06/19 1833 08/06/19 2159 08/07/19 0258 08/07/19 0456 08/07/19 0617 08/07/19 0831 08/07/19 1919  HGB 14.5  --  15.3* 14.0 15.0  --   --   HCT 48.9*  --  45.0 48.3* 44.0  --   --   PLT 195  --   --  182  --   --   --   HEPARINUNFRC  --   --   --   --   --  0.34 0.17*  CREATININE 1.43*  --   --  1.48*  --   --   --   TROPONINIHS 12 14  --   --   --   --   --     Estimated Creatinine Clearance: 85.2 mL/min (A) (by C-G formula based on SCr of 1.48 mg/dL (H)).   Medical History: Past Medical History:  Diagnosis Date  . Chronic diastolic heart failure ([place])    Echo 10/12: EF 50-55 // b. Echo 10/17: EF 50-55, mild BAE, PASP 42 // Echo 10/18: Mild concentric LVH, EF 55-60, normal wall motion, mild MAC, severe LAE, mild TR, PASP 15, trivial pericardial effusion  . CKD (chronic kidney disease) stage 4, GFR 15-29 ml/min (HCC) 10/25/2016   admitted in 2/18 with SCr of 10 in the setting of dehydration from illness, etc >> improved to 5 at DC  . DM2 (diabetes mellitus, type 2) (HCC)    A1c 7.2 in 05/2016  . Gestational diabetes mellitus in pregnancy 05/24/2013  . HTN in pregnancy, chronic 05/24/2013  . Hypertensive heart disease with CHF (congestive heart failure) ([place]) 05/25/2016  . Morbid obesity ([place])   . OSA (obstructive sleep apnea) 07/09/2011   s/p Trach 2/2 prolonged respiratory failure during admx for CHF in 05/2016  . Pulmonary hypertension ([place])    secondary from CHF/OSA  . Sinus tachycardia     Assessment: 38yo  female w/ trach presents w/ O2 sats in 30s at home, initially tx'd for PNA but found to have possible PE on CT (though limitations on exam), to begin heparin.  Initial heparin level therapeutic at 0.34 but at lower end of range, then heparin paused for redo trach.  PM level below goal at 0.17.  No known issues with IV infusion.  No overt bleeding or complications noted.   Goal of Therapy:  Heparin level 0.3-0.7 units/ml Monitor platelets by anticoagulation protocol: Yes   Plan:  Increase IV heparin to 2400 units/hr Recheck heparin level in 6 hrs. Daily heparin level and CBC.  Marguerite Olea, [facility] Clinical Pharmacist Phone [phone]  08/07/2019 8:53 PM

## 2019-08-07 NOTE — Progress Notes (Addendum)
Patient admitted overnight after loosing her tracheostomy.  They were unable to reinsert tracheostomy and patient was intubated from above due to acute hypoxemic and hypercarbic respiratory failure.  This morning, the patient is awake and interactive on exam with coarse BS diffusely.  I reviewed CXR myself, ETT is in a good position and no acute disease noted.  Patient is currently on heparin for r/o PE.  Will continue rocephin and doxy for now.  Hold heparin for re-trach and doubt very much this a PE, will order a V/Q scan given renal function.  Maintain fluid even.  Hope is to retrach and get of the vent today, order a swallow evaluation for tomorrow and attempt to normalize back to baseline.  2D echo and lower ext dopplers also ordered as I doubt the accuracy of the VQ scan given body habitus.  The patient is critically ill with multiple organ systems failure and requires high complexity decision making for assessment and support, frequent evaluation and titration of therapies, application of advanced monitoring technologies and extensive interpretation of multiple databases.   Critical Care Time devoted to patient care services described in this note is  35  Minutes. This time reflects time of care of this signee Dr Jennet Maduro. This critical care time does not reflect procedure time, or teaching time or supervisory time of PA/NP/Med student/Med Resident etc but could involve care discussion time.  Rush Farmer, M.D. [facility] Pulmonary/Critical Care Medicine.

## 2019-08-07 NOTE — Progress Notes (Signed)
VO per MD Nelda Marseille given for 38m of Versed and 1032m of Fentanyl. 2059mf Etomidate and 70m77m Rocuronium pushed per MD VO for RSI.

## 2019-08-07 NOTE — Progress Notes (Signed)
RT NOTE: RT attempted ATC per CCM order. RT placed patient on 60% and 10L flow. Patient desated down to 78%. RT placed patient back on ventilator and sats increased back to 100%. RT attempted PS/CPAP trial but patient only achieved tidal volumes in the 150's-200's on 16/8. Patient placed back on full support. Vitals are stable and sats are 100%. RT will continue to monitor.

## 2019-08-07 NOTE — Progress Notes (Signed)
Updated mother.

## 2019-08-07 NOTE — Progress Notes (Signed)
Report given to unit RRT.

## 2019-08-07 NOTE — Procedures (Signed)
Percutaneous Tracheostomy Placement  Consent from family.  Patient sedated, paralyzed and position.  Placed on 100% FiO2 and RR matched.  Area cleaned and draped.  Trachea palpated then punctured, catheter passed and visualized bronchoscopically.  Wire placed and visualized.  Catheter removed.  Airway then entered and dilated.  Size 6 cuffed shiley trach placed and visualized bronchoscopically well above carina.  Good volume returns.  Patient tolerated the procedure well without complications.  Minimal blood loss.  CXR ordered and pending.  Rush Farmer, M.D. [facility] Pulmonary/Critical Care Medicine. Pager: [phone]. After hours pager: [phone].

## 2019-08-07 NOTE — Procedures (Signed)
Bronchoscopy  Indication: Trach placement, airway exam  Consent: Signed in chart  Anesthesia: Etomidate, rocuronium, versed, fentanyl  Procedure - Timeout performed - Bronchoscope advanced through ETT - Airways examined down to subsegmental level - Following airway examination, trach placed by Dr. Nelda Marseille under directed visualization, bronchoscope advanced through new trach and in appropriate position.  Findings - Mild chronic bronchitis changes - Moderate mucoid secretions  Specimen(s): none  Complications: none

## 2019-08-07 NOTE — Progress Notes (Signed)
SLP Cancellation Note  Patient Details Name: [name] [name] MRN: [id number] DOB: May 28, 1981   Cancelled treatment:        Orders for swallowing evaluation and and PMV evaluation received and appreciated.  Pt has chronic trach which was changed at 1030 this morning and she is now on vent.  RN reports plans for TCT this afternoon.  SLP will plan to assess next date.   Celedonio Savage, [place], [place] Office: [phone]  08/07/2019, 11:25 AM

## 2019-08-07 NOTE — Progress Notes (Signed)
[facility] Progress Note Patient Name: [name] [name] DOB: 12-26-1980 MRN: [id number]   Date of Service  08/07/2019  HPI/Events of Note  Pt admitted to the ICU for acute on chronic respiratory failure requiring mechanical ventilation via an endotracheal tube that was advanced beyond her tracheostomy. Pt has morbid obesity, OSA,  s/p tracheostomy  eICU Interventions  New patient evaluation completed., check ABG.        Kerry Kass Trev Boley 08/07/2019, 4:26 AM

## 2019-08-07 NOTE — Progress Notes (Signed)
[facility] Progress Note Patient Name: [name] [name] DOB: November 30, 1980 MRN: [id number]   Date of Service  08/07/2019  HPI/Events of Note  PO2 on ABG 195, PH 7.28 Blood pressure a bit soft on 30 mcg of propofol  eICU Interventions  FIO2 reduced to 50 %, RN instructed to wean Propofol, will order soft restraints for ETT safety.        Kerry Kass Francesco Provencal 08/07/2019, 6:50 AM

## 2019-08-07 NOTE — Progress Notes (Signed)
Patient transported to nuclear med and back without complications. RN at bedside.

## 2019-08-07 NOTE — Progress Notes (Signed)
Patient transported on vent from ED to 4M-27 without complication.

## 2019-08-07 NOTE — Progress Notes (Signed)
ABG handed off to DR. Horton. ABG results: PH 7.10, CO2 >97, PAO2 111. Results unable to cross over due to CO2 above reportable range.

## 2019-08-07 NOTE — ED Provider Notes (Addendum)
Patient signed out pending admission.  Positive PE on CT scan.  Heparin ordered per pharmacy consult.  0100 AM  Patient assessed by respiratory therapy.  Concern for worsening mental status and respiratory status.  O2 sats in the high 80s on blow-by oxygen.  ABG was ordered.  ABG shows a pH of 7.1 with a PCO2 now greater than 97.  Likely the cause of the patient's altered mental status.  On my evaluation, she is arousable but somnolent and does not participate in questioning.  She is morbidly obese.  She has a 5-0 trach that she does not wear the inner cannula for.  I attempted to replace with a 4 oh cuffed Shiley to provide her with PEEP and ventilation to blow off CO2.  Patient stoma too small.  Her initial trach was replaced.  We were able to bag her through this.  Only option at this point was to intubate to provide mechanical ventilation, positive pressure, and ventilatory support.  [name] was left in place during intubation.  Initially she was intubated awake with 50 mg of ketamine.  Noted to be quite stenotic and with small vocal cords.  Paralytic had to be pushed in order to pass a 6-0 tube.  Patient maintained O2 sats during the procedure.  Chest x-ray ordered.  Will consult with the intensivist for admission.   Physical Exam  BP 126/84   Pulse 83   Resp 20   Ht 1.651 m (_0 )   Wt (!) 181.2 kg   SpO2 100%   BMI 66.48 kg/m   Physical Exam  Patient morbidly obese, somnolent but arousable Coarse breath sounds with significant secretions at trach  ED Course/Procedures     .Critical Care Performed by: Merryl Hacker, MD Authorized by: Merryl Hacker, MD   Critical care provider statement:    Critical care time (minutes):  35   Critical care was time spent personally by me on the following activities:  Discussions with consultants, evaluation of patient's response to treatment, examination of patient, ordering and performing treatments and interventions, ordering and  review of laboratory studies, ordering and review of radiographic studies, pulse oximetry, re-evaluation of patient's condition, obtaining history from patient or surrogate and review of old charts Procedure Name: Intubation Date/Time: 08/07/2019 2:01 AM Performed by: Merryl Hacker, MD Pre-anesthesia Checklist: Patient identified, Patient being monitored, Emergency Drugs available, Timeout performed and Suction available Oxygen Delivery Method: Ambu bag Preoxygenation: Pre-oxygenation with 100% oxygen Induction Type: Rapid sequence Ventilation: Unable to mask ventilate Laryngoscope Size: Glidescope and 4 Grade View: Grade I Tube size: 6.0 mm Number of attempts: 2 Airway Equipment and Method: Patient positioned with wedge pillow and Video-laryngoscopy Placement Confirmation: ETT inserted through vocal cords under direct vision,  CO2 detector and Breath sounds checked- equal and bilateral Secured at: 22 cm Tube secured with: ETT holder Dental Injury: Bloody posterior oropharynx  Difficulty Due To: Difficulty was anticipated, Difficult Airway- due to reduced neck mobility and Difficult Airway- due to limited oral opening Comments: Patient was intubated with current tracheostomy in place.  Initially used ketamine for an awake intubation as I felt it was too high risk to paralyze.  She did have a bloody posterior oropharynx likely related to trauma from attempting to place a cuffed tube.  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  Initially tried to pass a 7.5 ET tube without success.  Given good view of vocal cords, paralytic was pushed to optimize passing a tube.  Ultimately  6-0 tube was passed without difficulty.  There was bloody secretion.  [name] was removed and patient's O2 sats 100%.  She did not desaturate during the procedure.       MDM   Respiratory failure PE       Avraham Benish, Barbette Hair, MD 08/07/19 1410    Merryl Hacker, MD 08/07/19 [phone]

## 2019-08-07 NOTE — H&P (Signed)
NAME:  [name] [name], MRN:  [id number], DOB:  Jan 08, 1981, LOS: 0 ADMISSION DATE:  08/06/2019, CONSULTATION DATE:  08/07/19 REFERRING MD:  ED, CHIEF COMPLAINT:  Shortness of breath, hypoxemia  Brief History   [name] [name] is a 38 year old woman with a history of HFpEF, obesity, OSA treated with trach, DM2, CKD, HTN, pulmonary HTN ,  in ED after found to be hypoxemic.   History of present illness        In anticipation of trach change, she was undergoing covid testing, while there she was noted to have an O2 sat of 60%, and she was sent to the ER.  She noted that she had some shortness of breath and decreased energy over the past several weeks.  Denied cough, congestions, fever.   Per ED note, plan had been for gradual downsizing of trach with goal of removal.     Initially also found to be hypercarbic in the ED.  7.315/61.6/27   Follow up ABG showed progressively worsening hypercarbia 7.1/>97/111 (per RT note, I do not see this in the results)  AMS, dropping O2 sat. ED attempted to change cuffless 5-0 trach to 4-0 cuffed shiley, unable to pass.  Her original trach was replaced,  And she was bagged through this per note.  She was then intubated from above with a 6-0 tube due to"stenotic and  small vocal cords". Initially only given 9m ketamine, then given a paralytic to complete the intubation.   There was some trauma due to attempt to replace trach.   Per ED note, concern from radiologist re R sided PE, heparin was started.  Past Medical History  HFpEF, obesity, OSA treated with trach, DM2, CKD, HTN, pulmonary HTN 2/2 OSA/CHF  Home meds listed: amlodipine, coreg, isordil, lisinopril, hydralazine, lasix,nystatin, flonase  Echo 2018:  EF 55%-60% Not  technically sufficient to allow evaluation of LV diastolic function. - Right ventricle: The cavity size was normal. Wall thickness was   normal. Systolic function was normal. - Pulmonary arteries: Systolic pressure was within the  normal   range. PA peak pressure: 15 mm Hg (S).  Diagnosed with pHTN in 2017, intubated for resp failrue, HTN, pulm edema, OHS/OSA.   Significant Hospital Events     Consults:    Procedures:  12/14 Intubation   Significant Diagnostic Tests:  CTA: 1. Nearly nondiagnostic study as detailed above. 2. No definite large centrally located pulmonary embolism. 3. Probable filling defect within the lobar branches on the right and segmental branches of the bilateral lower lobes. Findings could represent acute pulmonary emboli in the appropriate clinical setting. This is not well evaluated secondary to the limitations described above. 4. The RV/LV ratio measures 1.3 raising concern for right-sided heart strain. 5. Significantly dilated main pulmonary artery which can be seen in patients with pulmonary artery hypertension. 6. Streaky linear bilateral airspace opacities favored to represent atelectasis with developing infiltrates not excluded. 7. Low lung volumes. The tracheostomy tube terminates above the carina.  CXR: Tracheostomy as described without pneumothorax. Consolidation, most likely due to pneumonia, left lower lobe with small left pleural effusion. There is cardiomegaly with pulmonary vascular congestion. No frank interstitial edema.   Micro Data:    Antimicrobials:  12/14 Cefepime 12/14 Vanc  Interim history/subjective:    Objective   Blood pressure (!) 95/54, pulse 69, temperature (!) 97 F (36.1 C), temperature source Temporal, resp. rate 16, height _0  (1.651 m), weight (!) 181.2 kg, SpO2 94 %, unknown if currently breastfeeding.  FiO2 (%):  [98 %] 98 %  No intake or output data in the 24 hours ending 08/07/19 0310 Filed Weights   08/06/19 2329  Weight: (!) 181.2 kg    Examination: General: sedated, occasionally coughing against vent.  HENT: intubated, scant blood in ET tube, trach stoma dressed, minor bleeding.  Lungs: rhonchi B Cardiovascular:  RRR  Abdomen: obese, no pparent tenderness Extremities: No edema Neuro: sedated  Resolved Hospital Problem list     Assessment & Plan:  38 year old woman with hx of HFpEF, obesity, OSA treated with trach, DM2, CKD, HTN, pulmonary HTN, here with hypoxemia.   1. Hypoxemic hypercarbic respiratory failure: Mild symptoms but profound hypoxemia. COVID negative. Unable to ventilate via original cuffless trach, unable to replace trach via small stoma, so patient was intubated in ED.    Possible CAP on initial CXR.  Worsening B infiltrates on follow up cxr.  Unable to r/o PE on CTA.  PA very large, with large RV:LV ratio - hx of PHTN, likely component of hypoxia.   -CTX and doxy for now.  Sputum cx p, procal p.  -Cont heparin for now - may need to hold given mild airway bleeding.  -echo pending -Monitor I/O, no UOP recorded, foley ordered. May need diuresis, but will monitor, bp slightly low now.   -LTV ventilation as tolerates.  Possibly progression of ARDS.   2. Traumatic trach change attempt.  Consider ENT consult for revision of trach.  I will not attempt now given trauma to area and mild bleeding.   3. Hx HTN  Hold anti HTN meds for now.   4. Hx diastolic CHF Takes daily lasix.   Will spot dose prn.   5. AKI/ CKD Cont to monitor cr. !.43 up from prior 1.23 (1/20) Best practice:  Diet: NPO for now Pain/Anxiety/Delirium protocol (if indicated): propofol, dilaudid prn VAP protocol (if indicated): yes DVT prophylaxis: heparin gtt.  GI prophylaxis: ppi Glucose control: monitor glucose, ISS if needed Mobility: bed Code Status: full  Family Communication:  Disposition: ICU  Labs   CBC: Recent Labs  Lab 08/06/19 1828 08/06/19 1833 08/07/19 0258  WBC  --  5.7  --   NEUTROABS  --  4.4  --   HGB 16.3* 14.5 15.3*  HCT 48.0* 48.9* 45.0  MCV  --  96.3  --   PLT  --  195  --     Basic Metabolic Panel: Recent Labs  Lab 08/06/19 1828 08/06/19 1833 08/07/19 0258  NA 141 139  142  K 4.1 4.0 4.2  CL  --  100  --   CO2  --  27  --   GLUCOSE  --  136*  --   BUN  --  19  --   CREATININE  --  1.43*  --   CALCIUM  --  8.6*  --    GFR: Estimated Creatinine Clearance: 88.1 mL/min (A) (by C-G formula based on SCr of 1.43 mg/dL (H)). Recent Labs  Lab 08/06/19 1826 08/06/19 1833  WBC  --  5.7  LATICACIDVEN 1.4  --     Liver Function Tests: Recent Labs  Lab 08/06/19 1833  AST 17  ALT 17  ALKPHOS 77  BILITOT 0.6  PROT 7.7  ALBUMIN 3.2*   No results for input(s): LIPASE, AMYLASE in the last 168 hours. No results for input(s): AMMONIA in the last 168 hours.  ABG    Component Value Date/Time   PHART 7.193 (LL) 08/07/2019  0258   PCO2ART 82.2 (HH) 08/07/2019 0258   PO2ART 84.0 08/07/2019 0258   HCO3 31.6 (H) 08/07/2019 0258   TCO2 34 (H) 08/07/2019 0258   O2SAT 92.0 08/07/2019 0258     Coagulation Profile: No results for input(s): INR, PROTIME in the last 168 hours.  Cardiac Enzymes: No results for input(s): CKTOTAL, CKMB, CKMBINDEX, TROPONINI in the last 168 hours.  HbA1C: Hgb A1c MFr Bld  Date/Time Value Ref Range Status  09/15/2016 08:55 AM 6.2 4.6 - 6.5 % Final    Comment:    Glycemic Control Guidelines for People with Diabetes:Non Diabetic:  <6%Goal of Therapy: <7%Additional Action Suggested:  >8%   05/25/2016 09:48 PM 7.9 (H) 4.8 - 5.6 % Final    Comment:    (NOTE)         Pre-diabetes: 5.7 - 6.4         Diabetes: >6.4         Glycemic control for adults with diabetes: <7.0     CBG: No results for input(s): GLUCAP in the last 168 hours.  Review of Systems:   Unable to assess  Past Medical History  She,  has a past medical history of Chronic diastolic heart failure ([place]), CKD (chronic kidney disease) stage 4, GFR 15-29 ml/min ([place]) (10/25/2016), DM2 (diabetes mellitus, type 2) ([place]), Gestational diabetes mellitus in pregnancy (05/24/2013), HTN in pregnancy, chronic (05/24/2013), Hypertensive heart disease with CHF (congestive heart  failure) ([place]) (05/25/2016), Morbid obesity ([place]), OSA (obstructive sleep apnea) (07/09/2011), Pulmonary hypertension ([place]), and Sinus tachycardia.   Surgical History    Past Surgical History:  Procedure Laterality Date  . TRACHEOSTOMY TUBE PLACEMENT N/A 06/11/2016   Procedure: TRACHEOSTOMY;  Surgeon: Melida Quitter, MD;  Location: [facility];  Service: ENT;  Laterality: N/A;     Social History   reports that she has never smoked. She has never used smokeless tobacco. She reports that she does not drink alcohol or use drugs.   Family History   Her family history includes Birth defects in her paternal grandfather; Diabetes in an other family member; Hypertension in her mother.   Allergies Allergies  Allergen Reactions  . No Known Allergies      Home Medications  Prior to Admission medications   Medication Sig Start Date End Date Taking? Authorizing Provider  amLODipine (NORVASC) 10 MG tablet Take 1 tablet (10 mg total) by mouth daily. 09/20/18   Burtis Junes, NP  carvedilol (COREG) 25 MG tablet Take 1 tablet (25 mg total) by mouth 2 (two) times daily. 09/20/18 12/19/18  Burtis Junes, NP  fluticasone (FLONASE) 50 MCG/ACT nasal spray Place 2 sprays into both nostrils 2 (two) times daily. 11/08/18   Erick Colace, NP  furosemide (LASIX) 40 MG tablet Take 1 tablet (40 mg total) by mouth daily. 12/18/18   Burtis Junes, NP  hydrALAZINE (APRESOLINE) 100 MG tablet TAKE 1 TABLET BY MOUTH THREE TIMES A DAY 04/23/19   Burtis Junes, NP  isosorbide dinitrate (ISORDIL) 30 MG tablet Take 1 tablet (30 mg total) by mouth 3 (three) times daily. 09/20/18   Burtis Junes, NP  lisinopril (PRINIVIL,ZESTRIL) 40 MG tablet Take 1 tablet (40 mg total) by mouth daily. 09/20/18 12/19/18  Burtis Junes, NP  nystatin ointment (MYCOSTATIN) Apply topically 2 (two) times daily. 01/05/19   Erick Colace, NP     Critical care time:60

## 2019-08-07 NOTE — Progress Notes (Signed)
  Echocardiogram 2D Echocardiogram has been performed.  [name] [name] 08/07/2019, 2:37 PM

## 2019-08-07 NOTE — Progress Notes (Signed)
Bilateral lower extremity venous duplex complete.  Please see CV Proc tab for preliminary results. Lita Mains- RDMS, RVT 5:44 PM  08/07/2019

## 2019-08-07 NOTE — Progress Notes (Signed)
ANTICOAGULATION CONSULT NOTE - [place] for heparin Indication: pulmonary embolus  Allergies  Allergen Reactions  . No Known Allergies     Patient Measurements: Height: 5' 5" (165.1 cm) Weight: (!) 400 lb (181.4 kg) IBW/kg (Calculated) : 57 Heparin Dosing Weight: 105kg  Vital Signs: Temp: 98.9 F (37.2 C) (12/15 1218) Temp Source: Oral (12/15 1218) BP: 131/80 (12/15 1215) Pulse Rate: 75 (12/15 1215)  Labs: Recent Labs    08/06/19 1833 08/06/19 2159 08/07/19 0258 08/07/19 0456 08/07/19 0617 08/07/19 0831  HGB 14.5  --  15.3* 14.0 15.0  --   HCT 48.9*  --  45.0 48.3* 44.0  --   PLT 195  --   --  182  --   --   HEPARINUNFRC  --   --   --   --   --  0.34  CREATININE 1.43*  --   --  1.48*  --   --   TROPONINIHS 12 14  --   --   --   --     Estimated Creatinine Clearance: 85.2 mL/min (A) (by C-G formula based on SCr of 1.48 mg/dL (H)).   Medical History: Past Medical History:  Diagnosis Date  . Chronic diastolic heart failure ([place])    Echo 10/12: EF 50-55 // b. Echo 10/17: EF 50-55, mild BAE, PASP 42 // Echo 10/18: Mild concentric LVH, EF 55-60, normal wall motion, mild MAC, severe LAE, mild TR, PASP 15, trivial pericardial effusion  . CKD (chronic kidney disease) stage 4, GFR 15-29 ml/min ([facility]) 10/25/2016   admitted in 2/18 with SCr of 10 in the setting of dehydration from illness, etc >> improved to 5 at DC  . DM2 (diabetes mellitus, type 2) ([facility])    A1c 7.2 in 05/2016  . Gestational diabetes mellitus in pregnancy 05/24/2013  . HTN in pregnancy, chronic 05/24/2013  . Hypertensive heart disease with CHF (congestive heart failure) ([place]) 05/25/2016  . Morbid obesity ([place])   . OSA (obstructive sleep apnea) 07/09/2011   s/p Trach 2/2 prolonged respiratory failure during admx for CHF in 05/2016  . Pulmonary hypertension ([place])    secondary from CHF/OSA  . Sinus tachycardia     Assessment: 38yo female w/ trach presents w/ O2 sats in 30s at  home, initially tx'd for PNA but found to have possible PE on CT (though limitations on exam), to begin heparin.  Initial heparin level therapeutic at 0.34 but at lower end of range, then heparin paused for redo trach.   Goal of Therapy:  Heparin level 0.3-0.7 units/ml Monitor platelets by anticoagulation protocol: Yes   Plan:  Restart heparin at 2100 units/h Recheck heparin level in [place], PharmD, BCPS Clinical Pharmacist [phone] Please check AMION for all [place] numbers 08/07/2019

## 2019-08-07 NOTE — ED Notes (Signed)
Spoke with patient's mother and updated on condition.

## 2019-08-08 ENCOUNTER — Inpatient Hospital Stay (HOSPITAL_COMMUNITY): Admission: RE | Admit: 2019-08-08 | Payer: Medicare Other | Source: Ambulatory Visit

## 2019-08-08 ENCOUNTER — Ambulatory Visit (HOSPITAL_COMMUNITY): Payer: Medicare Other

## 2019-08-08 ENCOUNTER — Other Ambulatory Visit: Payer: Self-pay

## 2019-08-08 DIAGNOSIS — J9509 Other tracheostomy complication: Secondary | ICD-10-CM

## 2019-08-08 DIAGNOSIS — J9602 Acute respiratory failure with hypercapnia: Secondary | ICD-10-CM

## 2019-08-08 DIAGNOSIS — N179 Acute kidney failure, unspecified: Secondary | ICD-10-CM

## 2019-08-08 LAB — CBC
HCT: 43.9 % (ref 36.0–46.0)
Hemoglobin: 13.6 g/dL (ref 12.0–15.0)
MCH: 27.6 pg (ref 26.0–34.0)
MCHC: 31 g/dL (ref 30.0–36.0)
MCV: 89.2 fL (ref 80.0–100.0)
Platelets: 155 10*3/uL (ref 150–400)
RBC: 4.92 MIL/uL (ref 3.87–5.11)
RDW: 18.2 % — ABNORMAL HIGH (ref 11.5–15.5)
WBC: 7 10*3/uL (ref 4.0–10.5)
nRBC: 0.7 % — ABNORMAL HIGH (ref 0.0–0.2)

## 2019-08-08 LAB — BASIC METABOLIC PANEL
Anion gap: 16 — ABNORMAL HIGH (ref 5–15)
BUN: 13 mg/dL (ref 6–20)
CO2: 25 mmol/L (ref 22–32)
Calcium: 8.4 mg/dL — ABNORMAL LOW (ref 8.9–10.3)
Chloride: 100 mmol/L (ref 98–111)
Creatinine, Ser: 1.29 mg/dL — ABNORMAL HIGH (ref 0.44–1.00)
GFR calc Af Amer: 60 mL/min — ABNORMAL LOW (ref >60)
GFR calc non Af Amer: 52 mL/min — ABNORMAL LOW (ref >60)
Glucose, Bld: 81 mg/dL (ref 70–99)
Potassium: 3.4 mmol/L — ABNORMAL LOW (ref 3.5–5.1)
Sodium: 141 mmol/L (ref 135–145)

## 2019-08-08 LAB — HEPARIN LEVEL (UNFRACTIONATED): Heparin Unfractionated: 0.31 IU/mL (ref 0.30–0.70)

## 2019-08-08 LAB — GLUCOSE, CAPILLARY: Glucose-Capillary: 88 mg/dL (ref 70–99)

## 2019-08-08 LAB — PHOSPHORUS: Phosphorus: 2 mg/dL — ABNORMAL LOW (ref 2.5–4.6)

## 2019-08-08 LAB — PROCALCITONIN: Procalcitonin: 0.16 ng/mL

## 2019-08-08 LAB — MAGNESIUM: Magnesium: 1.8 mg/dL (ref 1.7–2.4)

## 2019-08-08 MED ORDER — METOPROLOL TARTRATE 5 MG/5ML IV SOLN: 2.5000 mg | Freq: Three times a day (TID) | INTRAVENOUS | Status: DC

## 2019-08-08 MED ORDER — FUROSEMIDE 10 MG/ML IJ SOLN
40.0000 mg | Freq: Every day | INTRAMUSCULAR | Status: DC
  Administered 2019-08-08 – 2019-08-12 (×5): 40 mg via INTRAVENOUS
  Filled 2019-08-08 (×5): qty 4

## 2019-08-08 MED ORDER — HEPARIN SODIUM (PORCINE) 5000 UNIT/ML IJ SOLN
5000.0000 [IU] | Freq: Three times a day (TID) | INTRAMUSCULAR | Status: DC
  Administered 2019-08-08 – 2019-08-14 (×19): 5000 [IU] via SUBCUTANEOUS
  Filled 2019-08-08 (×19): qty 1

## 2019-08-08 MED ORDER — SODIUM PHOSPHATES 45 MMOLE/15ML IV SOLN
10.0000 mmol | Freq: Once | INTRAVENOUS | Status: AC
  Administered 2019-08-08: 07:00:00 10 mmol via INTRAVENOUS
  Filled 2019-08-08: qty 3.33

## 2019-08-08 MED ORDER — POTASSIUM CHLORIDE CRYS ER 20 MEQ PO TBCR
20.0000 meq | EXTENDED_RELEASE_TABLET | ORAL | Status: DC
  Administered 2019-08-08: 06:00:00 20 meq via ORAL
  Filled 2019-08-08 (×2): qty 1

## 2019-08-08 MED ORDER — METOPROLOL TARTRATE 5 MG/5ML IV SOLN
2.5000 mg | Freq: Three times a day (TID) | INTRAVENOUS | Status: DC
  Administered 2019-08-08 – 2019-08-09 (×3): 2.5 mg via INTRAVENOUS
  Filled 2019-08-08 (×3): qty 5

## 2019-08-08 MED ORDER — POTASSIUM CHLORIDE 10 MEQ/100ML IV SOLN
10.0000 meq | INTRAVENOUS | Status: AC
  Administered 2019-08-08 (×4): 10 meq via INTRAVENOUS
  Filled 2019-08-08 (×4): qty 100

## 2019-08-08 NOTE — Progress Notes (Signed)
NAME:  [name] [name], MRN:  [id number], DOB:  Jun 04, 1981, LOS: 1 ADMISSION DATE:  08/06/2019, CONSULTATION DATE:  08/07/19 REFERRING MD:  ED, CHIEF COMPLAINT:  Shortness of breath, hypoxemia  Brief History   Ms. Alire is a 38 year old woman with a history of HFpEF, obesity, OSA/OHS treated with trach after critical illness 2017, DM2, CKD, HTN, pulmonary HTN ,    History of present illness        In anticipation of trach change, she was undergoing covid testing, while there she was noted to have an O2 sat of 60%, and she was sent to the ER.  She noted that she had some shortness of breath and decreased energy over the past several weeks.  Denied cough, congestions, fever.   Plan had been for gradual downsizing of trach with goal of removal.     Initially also found to be hypercarbic in the ED.  7.315/61.6/27   Follow up ABG showed progressively worsening hypercarbia 7.1/>97/111 (per RT note, I do not see this in the results)  AMS, dropping O2 sat. ED attempted to change cuffless 5-0 trach to 4-0 cuffed shiley, unable to pass.  Her original trach was replaced,  And she was bagged through this per note.  She was then intubated from above with a 6-0 tube due to"stenotic and  small vocal cords". Initially only given 28m ketamine, then given a paralytic to complete the intubation.   There was some trauma due to attempt to replace trach.   Per ED note, concern from radiologist re R sided PE, heparin was started.    Past Medical History  HFpEF, obesity, OSA treated with trach, DM2, CKD, HTN, pulmonary HTN 2/2 OSA/CHF  Home meds listed: amlodipine, coreg, isordil, lisinopril, hydralazine, lasix,nystatin, flonase  Echo 2018:  EF 55%-60% Not  technically sufficient to allow evaluation of LV diastolic function. - Right ventricle: The cavity size was normal. Wall thickness was   normal. Systolic function was normal. - Pulmonary arteries: Systolic pressure was within the normal   range.  PA peak pressure: 15 mm Hg (S).  Diagnosed with pHTN in 2017, intubated for resp failrue, HTN, pulm edema, OHS/OSA.   Significant Hospital Events     Consults:    Procedures:  12/14 ETT >> 12/15 12/15 tracheostomy revised >>  Significant Diagnostic Tests:  CTA: 1. Nearly nondiagnostic study as detailed above. 2. No definite large centrally located pulmonary embolism. 3. Probable filling defect within the lobar branches on the right and segmental branches of the bilateral lower lobes. Findings could represent acute pulmonary emboli in the appropriate clinical setting. This is not well evaluated secondary to the limitations described above. 4. The RV/LV ratio measures 1.3 raising concern for right-sided heart strain. 5. Significantly dilated main pulmonary artery which can be seen in patients with pulmonary artery hypertension. 6. Streaky linear bilateral airspace opacities favored to represent atelectasis with developing infiltrates not excluded. 7. Low lung volumes. The tracheostomy tube terminates above the carina.  12/15 VQ scan negative 12/15 duplex-suboptimal negative 12/15 echo-normal LV function, surprisingly RV function normal   Micro Data:    Antimicrobials:  12/14 Cefepime 12/14 Vanc 12/15 ceftx >> 12/15 doxy >>  Interim history/subjective:   Critically ill, on PEEP of 8, rate of 35 Awake, off sedation  Objective   Blood pressure 138/78, pulse 81, temperature 98.9 F (37.2 C), temperature source Oral, resp. rate (!) 35, height _0  (1.651 m), weight (!) 171 kg, SpO2 94 %, unknown if  currently breastfeeding.    Vent Mode: PCV FiO2 (%):  [50 %] 50 % Set Rate:  [20 bmp-35 bmp] 20 bmp PEEP:  [8 cmH20] 8 cmH20 Plateau Pressure:  [29 cmH20-34 cmH20] 31 cmH20   Intake/Output Summary (Last 24 hours) at 08/08/2019 0845 Last data filed at 08/08/2019 0700 Gross per 24 hour  Intake 1231.1 ml  Output 3060 ml  Net -1828.9 ml   Filed Weights   08/06/19  2329 08/07/19 0500 08/08/19 0500  Weight: (!) 181.2 kg (!) 181.4 kg (!) 171 kg    Examination: General: Morbidly obese, no distress HENT: Tracheostomy, mild pallor, no icterus Lungs: rhonchi B, decreased breath sounds Cardiovascular: RRR  Abdomen: obese, large pannus, nontender Extremities: No edema Neuro: Awake and interactive, nonfocal grossly  Chest x-ray 12/15 personally reviewed, tracheostomy in position, bilateral interstitial/alveolar infiltrates with cardiomegaly  Resolved Hospital Problem list     Assessment & Plan:  38 year old woman with hx of HFpEF, obesity, OSA treated with trach, DM2, CKD, HTN, pulmonary HTN, admitted with hypoxic/hypercarbic respiratory failure likely due to blocked tracheostomy which was being downsized  1.  Acute hypoxemic hypercarbic respiratory failure:  COVID negative.  Bilateral infiltrates likely due to negative pressure edema , doubt pneumonia since procalcitonin low PE ruled out  -CTX and doxy for now-we will simplify tomorrow -dc heparin -Lower respiratory rate to 20, leave PEEP at 8 -Tidal volumes improved after suctioning, bloody secretions likely related to traumatic intubation -Continue weaning attempts with goal of trach collar eventually   2.  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 Hx HTN  Hold anti HTN meds for now.   4. Hx diastolic CHF -Resume daily Lasix, replete hypokalemia and hypophosphatemia  5. AKI-resolved, creatinine back to baseline CKD stage II   Attempt to mobilize.  If unable to liberate from the vent, will start tube feeding  Best practice:  Diet: NPO for now Pain/Anxiety/Delirium protocol (if indicated):  dilaudid prn VAP protocol (if indicated): yes DVT prophylaxis: heparin SQ GI prophylaxis: ppi Glucose control: monitor  glucose, ISS if needed Mobility: bed Code Status: full  Family Communication:  Disposition: ICU  The patient is critically ill with multiple organ systems failure and requires high complexity decision making for assessment and support, frequent evaluation and titration of therapies, application of advanced monitoring technologies and extensive interpretation of multiple databases. Critical Care Time devoted to patient care services described in this note independent of APP/resident  time is 35 minutes.   Kara Mead MD. [facility]. [facility]  If no response to pager , please call [phone]   08/08/2019

## 2019-08-08 NOTE — Progress Notes (Signed)
ANTICOAGULATION CONSULT NOTE - [place] for heparin Indication: pulmonary embolus  Allergies  Allergen Reactions  . No Known Allergies     Patient Measurements: Height: _0  (165.1 cm) Weight: (!) 400 lb (181.4 kg) IBW/kg (Calculated) : 57 Heparin Dosing Weight: 105kg  Vital Signs: Temp: 100.2 F (37.9 C) (12/16 0328) Temp Source: Oral (12/16 0328) BP: 157/82 (12/16 0301) Pulse Rate: 80 (12/16 0301)  Labs: Recent Labs    08/06/19 1833 08/06/19 2159 08/07/19 0258 08/07/19 0456 08/07/19 0617 08/07/19 0831 08/07/19 1919 08/08/19 0303  HGB 14.5  --  15.3* 14.0 15.0  --   --   --   HCT 48.9*  --  45.0 48.3* 44.0  --   --   --   PLT 195  --   --  182  --   --   --   --   HEPARINUNFRC  --   --   --   --   --  0.34 0.17* 0.31  CREATININE 1.43*  --   --  1.48*  --   --   --  1.29*  TROPONINIHS 12 14  --   --   --   --   --   --     Estimated Creatinine Clearance: 97.7 mL/min (A) (by C-G formula based on SCr of 1.29 mg/dL (H)).   Assessment: 38yo female w/ trach presents w/ O2 sats in 30s at home, initially tx'd for PNA but found to have possible PE on CT (though limitations on exam), to begin heparin.  Heparin level therapeutic (0.31) but at lower end of goal range. No issues with line or bleeding reported per RN.  Goal of Therapy:  Heparin level 0.3-0.7 units/ml Monitor platelets by anticoagulation protocol: Yes   Plan:  Increase IV heparin slightly to 2500 units/hr in hopes to keep in therapeutic range Recheck heparin level in 6 hrs. Daily heparin level and CBC.  Sherlon Handing, PharmD, BCPS Please see amion for complete clinical pharmacist phone list  08/08/2019 3:47 AM

## 2019-08-08 NOTE — Evaluation (Signed)
Physical Therapy Evaluation Patient Details Name: [name] [name] MRN: [id number] DOB: [birth year] Today's Date: 08/08/2019   History of Present Illness  38 year old woman with a history of HFpEF, obesity, OSA treated with trach, DM2, CKD, HTN, pulmonary HTN ,  in ED after found to be hypoxemic. Pt underwent percutaneous trach placement on 12/15.  Clinical Impression  Pt presents to PT with deficits in functional mobility, gait, balance, endurance, power, and cardiopulmonary function. Pt performs bed mobility, transfers, and limited gait training with minA-minG. Pt is mobilizing well and will benefit from contiued acute PT POC to improve endurance and assess cardiopulmonary tolerance to activity further. Pt is encouraged to mobilize OOB multiple times per day with assistance of staff.    Follow Up Recommendations Home health PT    Equipment Recommendations  Rolling walker with 5" wheels(pending progress)    Recommendations for Other Services       Precautions / Restrictions Precautions Precautions: Fall Restrictions Weight Bearing Restrictions: No      Mobility  Bed Mobility Overal bed mobility: Needs Assistance Bed Mobility: Supine to Sit;Sit to Supine     Supine to sit: Min guard Sit to supine: Min assist   General bed mobility comments: minA for LE management during sit to supine  Transfers Overall transfer level: Needs assistance Equipment used: 1 person hand held assist Transfers: Sit to/from Stand Sit to Stand: Min guard            Ambulation/Gait Ambulation/Gait assistance: Min guard Gait Distance (Feet): 3 Feet Assistive device: 1 person hand held assist Gait Pattern/deviations: Step-to pattern Gait velocity: reduced Gait velocity interpretation: <1.31 ft/sec, indicative of household ambulator General Gait Details: pt side stepping at edge of bed toward left side  Stairs            Wheelchair Mobility    Modified Rankin (Stroke Patients  Only)       Balance Overall balance assessment: Needs assistance Sitting-balance support: Single extremity supported;Feet supported Sitting balance-Leahy Scale: Good Sitting balance - Comments: close supervision   Standing balance support: Single extremity supported Standing balance-Leahy Scale: Fair Standing balance comment: minG with hand hold                             Pertinent Vitals/Pain Pain Assessment: No/denies pain    Home Living Family/patient expects to be discharged to:: Private residence Living Arrangements: Parent Available Help at Discharge: Family;Available 24 hours/day Type of Home: House Home Access: Stairs to enter Entrance Stairs-Rails: Can reach both Entrance Stairs-Number of Steps: 5 Home Layout: One level Home Equipment: None      Prior Function Level of Independence: Independent         Comments: pt ambulates household and very limited community distances     Journalist, newspaper        Extremity/Trunk Assessment   Upper Extremity Assessment Upper Extremity Assessment: Generalized weakness    Lower Extremity Assessment Lower Extremity Assessment: Generalized weakness    Cervical / Trunk Assessment Cervical / Trunk Assessment: Other exceptions(body habitus)  Communication   Communication: Tracheostomy;Other (comment)(vent)  Cognition Arousal/Alertness: Awake/alert Behavior During Therapy: WFL for tasks assessed/performed Overall Cognitive Status: Within Functional Limits for tasks assessed                                        General Comments General  comments (skin integrity, edema, etc.): Pt on full support of vent, SpO2 desat to 84% intermittently during mobility, remaining 87-93% for majority of mobility. Pt denies symptoms during activity    Exercises     Assessment/Plan    PT Assessment Patient needs continued PT services  PT Problem List Decreased strength;Decreased activity  tolerance;Decreased balance;Decreased mobility;Cardiopulmonary status limiting activity       PT Treatment Interventions DME instruction;Gait training;Functional mobility training;Stair training;Therapeutic activities;Therapeutic exercise;Balance training;Neuromuscular re-education;Patient/family education    PT Goals (Current goals can be found in the Care Plan section)  Acute Rehab PT Goals Patient Stated Goal: To improve mobility and return to prior level of function PT Goal Formulation: With patient/family Time For Goal Achievement: 08/22/19 Potential to Achieve Goals: Good    Frequency Min 3X/week   Barriers to discharge        Co-evaluation               AM-PAC PT "6 Clicks" Mobility  Outcome Measure Help needed turning from your back to your side while in a flat bed without using bedrails?: None Help needed moving from lying on your back to sitting on the side of a flat bed without using bedrails?: A Little Help needed moving to and from a bed to a chair (including a wheelchair)?: A Little Help needed standing up from a chair using your arms (e.g., wheelchair or bedside chair)?: A Little Help needed to walk in hospital room?: A Little Help needed climbing 3-5 steps with a railing? : A Lot 6 Click Score: 18    End of Session Equipment Utilized During Treatment: Oxygen Activity Tolerance: Patient tolerated treatment well Patient left: in bed;with call bell/phone within reach;with family/visitor present Nurse Communication: Mobility status PT Visit Diagnosis: Unsteadiness on feet (R26.81);Muscle weakness (generalized) (M62.81)    Time: 4446-1901 PT Time Calculation (min) (ACUTE ONLY): 26 min   Charges:   PT Evaluation $PT Eval Moderate Complexity: 1 Mod PT Treatments $Therapeutic Activity: 8-22 mins        Zenaida Niece, PT, DPT Acute Rehabilitation Pager: [phone]   Zenaida Niece 08/08/2019, 4:00 PM

## 2019-08-09 ENCOUNTER — Inpatient Hospital Stay (HOSPITAL_COMMUNITY): Payer: Medicare Other

## 2019-08-09 DIAGNOSIS — E66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: Secondary | ICD-10-CM

## 2019-08-09 LAB — BASIC METABOLIC PANEL
Anion gap: 16 — ABNORMAL HIGH (ref 5–15)
BUN: 10 mg/dL (ref 6–20)
CO2: 29 mmol/L (ref 22–32)
Calcium: 8.3 mg/dL — ABNORMAL LOW (ref 8.9–10.3)
Chloride: 97 mmol/L — ABNORMAL LOW (ref 98–111)
Creatinine, Ser: 1.21 mg/dL — ABNORMAL HIGH (ref 0.44–1.00)
GFR calc Af Amer: >60 mL/min (ref >60)
GFR calc non Af Amer: 56 mL/min — ABNORMAL LOW (ref >60)
Glucose, Bld: 79 mg/dL (ref 70–99)
Potassium: 3.2 mmol/L — ABNORMAL LOW (ref 3.5–5.1)
Sodium: 142 mmol/L (ref 135–145)

## 2019-08-09 LAB — CBC
HCT: 44 % (ref 36.0–46.0)
Hemoglobin: 13.8 g/dL (ref 12.0–15.0)
MCH: 27.6 pg (ref 26.0–34.0)
MCHC: 31.4 g/dL (ref 30.0–36.0)
MCV: 88 fL (ref 80.0–100.0)
Platelets: 151 10*3/uL (ref 150–400)
RBC: 5 MIL/uL (ref 3.87–5.11)
RDW: 18.2 % — ABNORMAL HIGH (ref 11.5–15.5)
WBC: 5.4 10*3/uL (ref 4.0–10.5)
nRBC: 0.4 % — ABNORMAL HIGH (ref 0.0–0.2)

## 2019-08-09 LAB — PROCALCITONIN: Procalcitonin: 0.24 ng/mL

## 2019-08-09 LAB — CULTURE, RESPIRATORY W GRAM STAIN: Culture: NORMAL

## 2019-08-09 LAB — MAGNESIUM: Magnesium: 1.7 mg/dL (ref 1.7–2.4)

## 2019-08-09 LAB — GLUCOSE, CAPILLARY
Glucose-Capillary: 81 mg/dL (ref 70–99)
Glucose-Capillary: 91 mg/dL (ref 70–99)

## 2019-08-09 LAB — PHOSPHORUS: Phosphorus: 3.7 mg/dL (ref 2.5–4.6)

## 2019-08-09 MED ORDER — CARVEDILOL 25 MG PO TABS
25.0000 mg | ORAL_TABLET | Freq: Two times a day (BID) | ORAL | Status: DC
  Administered 2019-08-09: 16:00:00 25 mg
  Filled 2019-08-09 (×2): qty 1

## 2019-08-09 MED ORDER — FENTANYL CITRATE (PF) 100 MCG/2ML IJ SOLN: 12.5000 ug | INTRAMUSCULAR | Status: DC | PRN

## 2019-08-09 MED ORDER — CARVEDILOL 25 MG PO TABS
25.0000 mg | ORAL_TABLET | Freq: Once | ORAL | Status: AC
  Administered 2019-08-10: 25 mg via JEJUNOSTOMY
  Filled 2019-08-09: qty 1

## 2019-08-09 MED ORDER — MAGNESIUM SULFATE 2 GM/50ML IV SOLN
2.0000 g | Freq: Once | INTRAVENOUS | Status: AC
  Administered 2019-08-09: 10:00:00 2 g via INTRAVENOUS
  Filled 2019-08-09: qty 50

## 2019-08-09 MED ORDER — CHLORHEXIDINE GLUCONATE 0.12 % MT SOLN
OROMUCOSAL | Status: AC
  Filled 2019-08-09: qty 15

## 2019-08-09 MED ORDER — HYDRALAZINE HCL 50 MG PO TABS
50.0000 mg | ORAL_TABLET | Freq: Three times a day (TID) | ORAL | Status: DC
  Administered 2019-08-09 – 2019-08-13 (×13): 50 mg
  Filled 2019-08-09 (×14): qty 1

## 2019-08-09 MED ORDER — SODIUM CHLORIDE 3 % IN NEBU
4.0000 mL | INHALATION_SOLUTION | Freq: Two times a day (BID) | RESPIRATORY_TRACT | Status: AC
  Administered 2019-08-09 – 2019-08-11 (×5): 4 mL via RESPIRATORY_TRACT
  Filled 2019-08-09 (×7): qty 4

## 2019-08-09 MED ORDER — PANTOPRAZOLE SODIUM 40 MG PO PACK
40.0000 mg | PACK | Freq: Every day | ORAL | Status: DC
  Administered 2019-08-09 – 2019-08-13 (×4): 40 mg
  Filled 2019-08-09 (×8): qty 20

## 2019-08-09 MED ORDER — VITAL HIGH PROTEIN PO LIQD
1000.0000 mL | ORAL | Status: DC
  Administered 2019-08-09 – 2019-08-10 (×2): 1000 mL

## 2019-08-09 MED ORDER — POTASSIUM CHLORIDE 10 MEQ/100ML IV SOLN
10.0000 meq | INTRAVENOUS | Status: AC
  Administered 2019-08-09 (×4): 10 meq via INTRAVENOUS
  Filled 2019-08-09 (×2): qty 100

## 2019-08-09 MED ORDER — CARVEDILOL 25 MG PO TABS
25.0000 mg | ORAL_TABLET | Freq: Two times a day (BID) | ORAL | Status: DC
  Administered 2019-08-10 – 2019-08-13 (×8): 25 mg
  Filled 2019-08-09 (×9): qty 1

## 2019-08-09 NOTE — Progress Notes (Signed)
Initial Nutrition Assessment  DOCUMENTATION CODES:   Morbid obesity  INTERVENTION:   Tube Feeding:  Vital High Protein at 60 ml/hr Provides 1440 kcals, 127 g of protein and 1210 mL of free water Meets 100% estimated calorie and protein needs  NUTRITION DIAGNOSIS:   Inadequate oral intake related to acute illness as evidenced by NPO status.  GOAL:   Patient will meet greater than or equal to 90% of their needs  MONITOR:   Vent status, Labs, TF tolerance, Weight trends, Skin, Diet advancement  REASON FOR ASSESSMENT:   Consult, Ventilator Enteral/tube feeding initiation and management  ASSESSMENT:   38 yo female admitted with acute on chronic respiratory failure with b/l infiltrates requiring vent support via trach; PMH includes HTN, CHF, CKD II, DM, OSA, obesity  Pt on vent support via trach; plan for trach collar as able but have been unable to liberate from the vent due to secretions and edema/pneumonia. At baseline pt sleeping with CPAP and trach capped  Vital High Protein at 20 ml/hr ordered today, NG tube inserted and awaiting xray confirmation.   At baseline, pt takes a po diet. No long term feeding tube in place  Labs: potassium 3.2 (L), Creatinine 1.21, BUN wdl Meds: lasix, mag sulfate, lasix    Diet Order:   Diet Order            Diet NPO time specified  Diet effective now              EDUCATION NEEDS:   Not appropriate for education at this time  Skin:  Skin Assessment: Reviewed RN Assessment  Last BM:  no documented BM  Height:   Ht Readings from Last 1 Encounters:  08/06/19 _0  (1.651 m)    Weight:   Wt Readings from Last 1 Encounters:  08/08/19 (!) 171 kg    Ideal Body Weight:  56.8 kg  BMI:  Body mass index is 62.74 kg/m.  Estimated Nutritional Needs:   Kcal:  1254-1425 kcals  Protein:  114-142 g  Fluid:  >/= 1.8 L  [employer] MS, RDN, LDN, CNSC [phone] Pager  [phone] Weekend/On-Call Pager

## 2019-08-09 NOTE — Progress Notes (Signed)
[facility] Progress Note Patient Name: [name] [name] DOB: 09/12/1980 MRN: [id number]   Date of Service  08/09/2019  HPI/Events of Note  Hypertension - BP = 171/96 with HR = 81. Will restart home Hydralazine at 1/2 usual home dose.   eICU Interventions  Will order: 1. Hydralazine 50 mg per tube Q 8 hours. Hold for SBP < 105.     Intervention Category Major Interventions: Hypertension - evaluation and management  Imir Brumbach [place] 08/09/2019, 10:25 PM

## 2019-08-09 NOTE — TOC Initial Note (Signed)
Transition of Care [facility]) - Initial/Assessment Note    Patient Details  Name: [name] [name] MRN: [id number] Date of Birth: [birth year]  [facility]) CM/SW Contact:    Bethena Roys, RN Phone Number: 08/09/2019, 4:05 PM  Clinical Narrative:    Case Manager received referral for home health [facility]) needs. Patient is agreeable to [facility]- Medicare.gov list provided to patient. Patient is agreeable to [facility]. Referral sent to [facility]- office that can service the patient is small in [place]. First available would be Dec 30 th to see her and start care per agency. Case Manager will call [facility] Liaison once patient gets closer to transition home to verify if the agency can still accept the patient. Prior to arrival, patient was from home with her mother's support. Case Manager will continue to follow for additional transition of care needs.               Expected Discharge Plan: [place] Barriers to Discharge: Continued Medical Work up(Acute hypoxemic hypercarbic respiratory failure Bilateral infiltrates 2/2 negative pressure edema)   Patient Goals and CMS Choice   CMS Medicare.gov Compare Post Acute Care list provided to:: Patient Choice offered to / list presented to : Patient  Expected Discharge Plan and Services Expected Discharge Plan: [place] In-house Referral: NA Discharge Planning Services: CM Consult Post Acute Care Choice: [place] arrangements for the past 2 months: Single Family Home                   HH Arranged: PT HH Agency: [facility] (formerly [employer]) Date [place]: 08/09/19 Time [place]: [phone] Representative spoke with at [place]: [place]  Prior Living Arrangements/Services Living arrangements for the past 2 months: [place] Lives with:: Parents(lives with her mother) Patient language and need for  interpreter reviewed:: Yes Do you feel safe going back to the place where you live?: Yes      Need for Family Participation in Patient Care: Yes (Comment) Care giver support system in place?: Yes (comment)      Activities of Daily Living Home Assistive Devices/Equipment: Vent/Trach supplies ADL Screening (condition at time of admission) Patient's cognitive ability adequate to safely complete daily activities?: Yes Is the patient deaf or have difficulty hearing?: No Does the patient have difficulty seeing, even when wearing glasses/contacts?: No Does the patient have difficulty concentrating, remembering, or making decisions?: No Patient able to express need for assistance with ADLs?: Yes Does the patient have difficulty dressing or bathing?: No Independently performs ADLs?: Yes (appropriate for developmental age) Does the patient have difficulty walking or climbing stairs?: Yes Weakness of Legs: Both Weakness of Arms/Hands: Both  Permission Sought/Granted Permission sought to share information with : Family Supports, Chartered certified accountant granted to share information with : Yes, Verbal Permission Granted     Permission granted to share info w AGENCY: [facility]        Emotional Assessment Appearance:: Appears stated age Attitude/Demeanor/Rapport: Engaged   Orientation: : Oriented to Situation, Oriented to  Time, Oriented to Place, Oriented to Self Alcohol / Substance Use: Not Applicable Psych Involvement: No (comment)  Admission diagnosis:  Hypoxemia [R09.02] Hypoxia [R09.02] Acute pulmonary embolism ([place]) [I26.99] Acute pulmonary embolism without acute cor pulmonale, unspecified pulmonary embolism type ([place]) [I26.99] Patient Active Problem List   Diagnosis Date Noted  . Acute pulmonary embolism ([place]) 08/07/2019  .  Hypoxemia 08/07/2019  . Tracheostomy care ([place])   . (HFpEF) heart failure with preserved ejection fraction ([place])   . Hypertension    . Peripheral edema   . Tracheostomy dependence ([place])   . Purulent bronchitis ([place])   . CKD (chronic kidney disease) stage 4, GFR 15-29 ml/min (HCC) 10/25/2016  . Acute renal failure superimposed on stage 3 chronic kidney disease ([place]) 09/15/2016  . Pneumonitis 09/15/2016  . Abnormal urinalysis 09/15/2016  . Dysphagia   . Urinary retention   . Hypoalbuminemia due to protein-calorie malnutrition ([place])   . Debilitated 06/29/2016  . Demand ischemia ([place])   . Moderate single current episode of major depressive disorder ([place])   . Tracheostomy status ([place])   . Chronic diastolic heart failure ([place])   . Obesity hypoventilation syndrome ([place])   . Chronic respiratory failure with hypercapnia ([place])   . HCAP (healthcare-associated pneumonia)   . Sinus pause 06/07/2016  . Difficult intravenous access   . Hepatic congestion 05/26/2016  . Sinus tachycardia 05/26/2016  . Pericardial effusion 05/26/2016  . Pulmonary hypertension ([place])   . Renal insufficiency 05/25/2016  . Abnormal EKG 05/25/2016  . Abdominal pain 05/25/2016  . Hypertensive heart disease with CHF (congestive heart failure) ([place]) 05/25/2016  . Oligohydramnios, delivered 05/27/2013  . NSVD (normal spontaneous vaginal delivery) 05/27/2013  . OSA (obstructive sleep apnea) 07/09/2011  . Morbid obesity ([place]) 05/18/2011   PCP:  Flossie Buffy, NP Pharmacy:   [id number] [phone] [place] [address] [id number]: [phone]: [phone]    Social Determinants of Health (SDOH) Interventions    Readmission Risk Interventions No flowsheet data found.

## 2019-08-09 NOTE — Progress Notes (Signed)
RT placed patient on 40% ATC per CCM verbal order. Patient immediately desaturated to 74% despite increasing ATC to 98%. Patient was placed back on the ventilator on full support vent settings & suctioned without difficulty. RN is aware. Will make CCM aware as well.

## 2019-08-09 NOTE — Progress Notes (Signed)
NAME:  [name] [name], MRN:  [id number], DOB:  Jan 05, 1981, LOS: 2 ADMISSION DATE:  08/06/2019, CONSULTATION DATE:  08/07/19 REFERRING MD:  ED, CHIEF COMPLAINT:  Shortness of breath, hypoxemia  Brief History   [name] [name] is a 38 year old woman with a history of HFpEF, obesity, OSA/OHS treated with trach after critical illness 2017, DM2, CKD, HTN, pulmonary HTN ,    History of present illness        In anticipation of trach change, she was undergoing covid testing, while there she was noted to have an O2 sat of 60%, and she was sent to the ER.  She noted that she had some shortness of breath and decreased energy over the past several weeks.  Denied cough, congestions, fever.   Plan had been for gradual downsizing of trach with goal of removal.     Initially also found to be hypercarbic in the ED.  7.315/61.6/27   Follow up ABG showed progressively worsening hypercarbia 7.1/>97/111 (per RT note, I do not see this in the results)  AMS, dropping O2 sat. ED attempted to change cuffless 5-0 trach to 4-0 cuffed shiley, unable to pass.  Her original trach was replaced,  And she was bagged through this per note.  She was then intubated from above with a 6-0 tube due to"stenotic and  small vocal cords". Initially only given 78m ketamine, then given a paralytic to complete the intubation.   There was some trauma due to attempt to replace trach.   Per ED note, concern from radiologist re R sided PE, heparin was started.    Past Medical History  HFpEF, obesity, OSA treated with trach, DM2, CKD, HTN, pulmonary HTN 2/2 OSA/CHF  Home meds listed: amlodipine, coreg, isordil, lisinopril, hydralazine, lasix,nystatin, flonase  Echo 2018:  EF 55%-60% Not  technically sufficient to allow evaluation of LV diastolic function. - Right ventricle: The cavity size was normal. Wall thickness was   normal. Systolic function was normal. - Pulmonary arteries: Systolic pressure was within the normal   range.  PA peak pressure: 15 mm Hg (S).  Diagnosed with pHTN in 2017, intubated for resp failrue, HTN, pulm edema, OHS/OSA.   Significant Hospital Events    12/16 failing wean due to secretions  Consults:    Procedures:  12/14 ETT >> 12/15 12/15 tracheostomy revised >>  Significant Diagnostic Tests:  CTA: 1. Nearly nondiagnostic study as detailed above. 2. No definite large centrally located pulmonary embolism. 3. Probable filling defect within the lobar branches on the right and segmental branches of the bilateral lower lobes. Findings could represent acute pulmonary emboli in the appropriate clinical setting. This is not well evaluated secondary to the limitations described above. 4. The RV/LV ratio measures 1.3 raising concern for right-sided heart strain. 5. Significantly dilated main pulmonary artery which can be seen in patients with pulmonary artery hypertension. 6. Streaky linear bilateral airspace opacities favored to represent atelectasis with developing infiltrates not excluded. 7. Low lung volumes. The tracheostomy tube terminates above the carina.  12/15 VQ scan negative 12/15 duplex-suboptimal negative 12/15 echo-normal LV function, surprisingly RV function normal   Micro Data:  12/15 tracheal aspirate >>  Antimicrobials:  12/14 Cefepime 12/14 Vanc 12/15 ceftx >> 12/15 doxy >>  Interim history/subjective:   Critically ill, afebrile Failing weaning this morning  Objective   Blood pressure (!) 148/73, pulse 82, temperature 99.2 F (37.3 C), temperature source Oral, resp. rate 17, height _0  (1.651 m), weight (!) 171 kg, SpO2  95 %, unknown if currently breastfeeding.    Vent Mode: PCV FiO2 (%):  [50 %] 50 % Set Rate:  [20 bmp] 20 bmp PEEP:  [8 cmH20] 8 cmH20 Pressure Support:  [18 cmH20] 18 cmH20 Plateau Pressure:  [21 cmH20-32 cmH20] 22 cmH20   Intake/Output Summary (Last 24 hours) at 08/09/2019 0913 Last data filed at 08/09/2019 0800 Gross per 24  hour  Intake 1277.74 ml  Output 2600 ml  Net -1322.26 ml   Filed Weights   08/06/19 2329 08/07/19 0500 08/08/19 0500  Weight: (!) 181.2 kg (!) 181.4 kg (!) 171 kg    Examination: General: Morbidly obese, no distress HENT: Tracheostomy, mild pallor, no icterus Lungs:  decreased breath sounds bilateral Cardiovascular: RRR  Abdomen: obese, large pannus, nontender Extremities: No edema Neuro: Awake and interactive, nonfocal grossly  Chest x-ray 12/15 personally reviewed, tracheostomy in position, bilateral interstitial/alveolar infiltrates with cardiomegaly   Resolved Hospital Problem list     Assessment & Plan:  38 year old woman with hx of HFpEF, obesity, OSA treated with trach, DM2, CKD, HTN, pulmonary HTN, admitted with hypoxic/hypercarbic respiratory failure likely due to blocked tracheostomy which was being downsized vs bilateral infiltrates?  Edema/atypical pneumonia  1.  Acute hypoxemic hypercarbic respiratory failure:  COVID negative.  Bilateral infiltrates likely due to negative pressure edema , doubt pneumonia since procalcitonin low PE ruled out  -CTX and doxy till 12/19 -dc heparin -Lower PEEP to 5 -Continue weaning attempts with goal of trach collar eventually -she seemed to be double clutching showing a falsely elevated respiratory rate on vent, transition her to trach collar but within 15 minutes she had drop in saturations and tachypnea and placed back on vent   2.  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 Hx HTN  Hold anti HTN meds for now.   4. Hx diastolic CHF -Resume daily Lasix, replete hypokalemia and hypomagnesemia  5. AKI-resolved, creatinine back to baseline CKD stage II   Summary-unable to liberate from the vent today, may be a combination of secretions/bleeding and  edema/pneumonia will place NG tube and start tube feeds  Best practice:  Diet: NPO for now Pain/Anxiety/Delirium protocol (if indicated):  dilaudid prn VAP protocol (if indicated): yes DVT prophylaxis: heparin SQ GI prophylaxis: ppi Glucose control: monitor glucose, ISS if needed Mobility: bed Code Status: full  Family Communication: Patient and mother Disposition: ICU  The patient is critically ill with multiple organ systems failure and requires high complexity decision making for assessment and support, frequent evaluation and titration of therapies, application of advanced monitoring technologies and extensive interpretation of multiple databases. Critical Care Time devoted to patient care services described in this note independent of APP/resident  time is 32 minutes.   Kara Mead MD. Shade Flood. [facility]  If no response to pager , please call [phone]   08/09/2019

## 2019-08-09 NOTE — Progress Notes (Signed)
SLP Cancellation Note  Patient Details Name: MANDY PEEKS MRN: [id number] DOB: 04-Oct-1980   Cancelled treatment:  Pt still on vent - will follow for readiness for PMV and then swallowing when appropriate.  Blakelee Allington L. Tivis Ringer, [place] CCC/SLP Acute Rehabilitation Services Office number [phone] Pager [phone]         Juan Quam Laurice 08/09/2019, 10:33 AM

## 2019-08-09 NOTE — Progress Notes (Signed)
Potassium 3.2. [place] RN notified. Waiting for orders.

## 2019-08-09 NOTE — Progress Notes (Signed)
BP 160s/90-100s. MD made aware. RN instructed to give next dose or Coreg at 0000.

## 2019-08-09 NOTE — Progress Notes (Signed)
RT called to patient's room by RN due to low Mve. Upon arrival patient was not getting VT's and Mve was reading at 2.0. Patient was removed from the ventilator and bagged lavaged. A large mucous plug was obtained. CCM was made aware.

## 2019-08-10 ENCOUNTER — Inpatient Hospital Stay (HOSPITAL_COMMUNITY): Payer: Medicare Other

## 2019-08-10 LAB — CBC
HCT: 44.3 % (ref 36.0–46.0)
Hemoglobin: 13.6 g/dL (ref 12.0–15.0)
MCH: 27.3 pg (ref 26.0–34.0)
MCHC: 30.7 g/dL (ref 30.0–36.0)
MCV: 89 fL (ref 80.0–100.0)
Platelets: 149 10*3/uL — ABNORMAL LOW (ref 150–400)
RBC: 4.98 MIL/uL (ref 3.87–5.11)
RDW: 17.9 % — ABNORMAL HIGH (ref 11.5–15.5)
WBC: 5.5 10*3/uL (ref 4.0–10.5)
nRBC: 0 % (ref 0.0–0.2)

## 2019-08-10 LAB — BASIC METABOLIC PANEL
Anion gap: 15 (ref 5–15)
BUN: 12 mg/dL (ref 6–20)
CO2: 29 mmol/L (ref 22–32)
Calcium: 8.5 mg/dL — ABNORMAL LOW (ref 8.9–10.3)
Chloride: 95 mmol/L — ABNORMAL LOW (ref 98–111)
Creatinine, Ser: 1.13 mg/dL — ABNORMAL HIGH (ref 0.44–1.00)
GFR calc Af Amer: >60 mL/min (ref >60)
GFR calc non Af Amer: >60 mL/min (ref >60)
Glucose, Bld: 91 mg/dL (ref 70–99)
Potassium: 3.1 mmol/L — ABNORMAL LOW (ref 3.5–5.1)
Sodium: 139 mmol/L (ref 135–145)

## 2019-08-10 LAB — GLUCOSE, CAPILLARY
Glucose-Capillary: 87 mg/dL (ref 70–99)
Glucose-Capillary: 78 mg/dL (ref 70–99)
Glucose-Capillary: 104 mg/dL — ABNORMAL HIGH (ref 70–99)
Glucose-Capillary: 119 mg/dL — ABNORMAL HIGH (ref 70–99)
Glucose-Capillary: 112 mg/dL — ABNORMAL HIGH (ref 70–99)
Glucose-Capillary: 106 mg/dL — ABNORMAL HIGH (ref 70–99)

## 2019-08-10 LAB — MAGNESIUM: Magnesium: 1.9 mg/dL (ref 1.7–2.4)

## 2019-08-10 MED ORDER — DEXTROSE 5 % IV SOLN: INTRAVENOUS | Status: DC

## 2019-08-10 MED ORDER — VITAL HIGH PROTEIN PO LIQD
1000.0000 mL | ORAL | Status: DC
  Administered 2019-08-11 – 2019-08-13 (×4): 1000 mL

## 2019-08-10 MED ORDER — IPRATROPIUM-ALBUTEROL 0.5-2.5 (3) MG/3ML IN SOLN
3.0000 mL | Freq: Four times a day (QID) | RESPIRATORY_TRACT | Status: DC
  Administered 2019-08-11: 3 mL via RESPIRATORY_TRACT
  Filled 2019-08-10: qty 3

## 2019-08-10 MED ORDER — VITAL HIGH PROTEIN PO LIQD: 1000.0000 mL | ORAL | Status: DC

## 2019-08-10 MED ORDER — POTASSIUM CHLORIDE 20 MEQ/15ML (10%) PO SOLN
40.0000 meq | ORAL | Status: AC
  Administered 2019-08-10 (×2): 40 meq
  Filled 2019-08-10 (×2): qty 30

## 2019-08-10 NOTE — Progress Notes (Signed)
Physical Therapy Treatment Patient Details Name: [name] [name] MRN: [id number] DOB: Mar 17, 1981 Today's Date: 08/10/2019    History of Present Illness 38 year old woman with a history of HFpEF, obesity, OSA treated with trach, DM2, CKD, HTN, pulmonary HTN ,  in ED after found to be hypoxemic. Pt underwent percutaneous trach placement on 12/15.    PT Comments    Pt tolerated treatment well, increasing activity tolerance and ambulation distances. Pt demonstrates improved muscular power performing squats from recliner and with improved ease of transfers. Pt with improved respiratory tolerance with mobility, on reduced vent settings and with less notable periods of desaturation compared to previous treatment. Pt will continue to benefit from acute PT POC to improve activity tolerance and independence in mobility.   Follow Up Recommendations  Home health PT     Equipment Recommendations  None recommended by PT    Recommendations for Other Services       Precautions / Restrictions Precautions Precautions: Fall Restrictions Weight Bearing Restrictions: No    Mobility  Bed Mobility Overal bed mobility: (pt received and left in recliner)                Transfers Overall transfer level: Needs assistance Equipment used: None Transfers: Sit to/from Stand Sit to Stand: Supervision         General transfer comment: pt performs 3 sit to stand trials all with supervision  Ambulation/Gait Ambulation/Gait assistance: Supervision Gait Distance (Feet): 30 Feet(2 trials fwd and back 3' at a time 29' total per trial) Assistive device: None Gait Pattern/deviations: Step-to pattern;Wide base of support Gait velocity: reduced Gait velocity interpretation: 1.31 - 2.62 ft/sec, indicative of limited community ambulator General Gait Details: pt with shortened step to gait with widened BOS, no significant balance deviations noted   Stairs             Wheelchair Mobility     Modified Rankin (Stroke Patients Only)       Balance Overall balance assessment: Needs assistance Sitting-balance support: No upper extremity supported;Feet supported Sitting balance-Leahy Scale: Good Sitting balance - Comments: supervision   Standing balance support: No upper extremity supported Standing balance-Leahy Scale: Good Standing balance comment: supervision                            Cognition Arousal/Alertness: Awake/alert Behavior During Therapy: WFL for tasks assessed/performed Overall Cognitive Status: Within Functional Limits for tasks assessed                                        Exercises General Exercises - Lower Extremity Ankle Circles/Pumps: AROM;Both;20 reps Gluteal Sets: AROM;Both;10 reps Long Arc Quad: AROM;Both;20 reps Other Exercises Other Exercises: Squats with UE support of arm rest, 10 repetitions    General Comments General comments (skin integrity, edema, etc.): VSS, pt on vent 15L/min 50% FiO2, desats to 91% with mobility intermittently but recovers quickly to 94% with seated rest break, HR remains stable during activity      Pertinent Vitals/Pain Pain Assessment: No/denies pain    Home Living                      Prior Function            PT Goals (current goals can now be found in the care plan section) Acute Rehab PT Goals  Patient Stated Goal: To improve mobility and return to prior level of function Progress towards PT goals: Progressing toward goals    Frequency    Min 3X/week      PT Plan Current plan remains appropriate    Co-evaluation              AM-PAC PT "6 Clicks" Mobility   Outcome Measure  Help needed turning from your back to your side while in a flat bed without using bedrails?: None Help needed moving from lying on your back to sitting on the side of a flat bed without using bedrails?: A Little Help needed moving to and from a bed to a chair (including a  wheelchair)?: None Help needed standing up from a chair using your arms (e.g., wheelchair or bedside chair)?: None Help needed to walk in hospital room?: None Help needed climbing 3-5 steps with a railing? : A Lot 6 Click Score: 21    End of Session Equipment Utilized During Treatment: Oxygen Activity Tolerance: Patient tolerated treatment well Patient left: in chair;with call bell/phone within reach Nurse Communication: Mobility status PT Visit Diagnosis: Unsteadiness on feet (R26.81);Muscle weakness (generalized) (M62.81)     Time: 6484-7207 PT Time Calculation (min) (ACUTE ONLY): 25 min  Charges:  $Gait Training: 8-22 mins $Therapeutic Exercise: 8-22 mins                     Zenaida Niece, PT, DPT Acute Rehabilitation Pager: [phone]    Zenaida Niece 08/10/2019, 4:01 PM

## 2019-08-10 NOTE — Progress Notes (Signed)
SLP Cancellation Note  Patient Details Name: SALLEY BOXLEY MRN: [id number] DOB: 1981-02-07   Cancelled treatment:       Reason Eval/Treat Not Completed: Medical issues which prohibited therapy(Remains on vent. Will continue to f/u. )  Gabriel Rainwater MA, CCC-SLP   Imogean Ciampa Meryl 08/10/2019, 8:52 AM

## 2019-08-10 NOTE — Progress Notes (Signed)
NAME:  [name] [name], MRN:  [id number], DOB:  06/18/1981, LOS: 3 ADMISSION DATE:  08/06/2019, CONSULTATION DATE:  08/07/19 REFERRING MD:  ED, CHIEF COMPLAINT:  Shortness of breath, hypoxemia  Brief History   [name] [name] is a 38 year old woman with a history of HFpEF, obesity, OSA/OHS treated with trach after critical illness 2017, DM2, CKD, HTN, pulmonary HTN ,   Admitted with acute hypercarbic respiratory failure after attempted trach downsize  History of present illness        In anticipation of trach change, she was undergoing covid testing, while there she was noted to have an O2 sat of 60%, and she was sent to the ER.  She noted that she had some shortness of breath and decreased energy over the past several weeks.  Denied cough, congestions, fever.   Plan had been for gradual downsizing of trach with goal of removal.     Initially also found to be hypercarbic in the ED.  7.315/61.6/27   Follow up ABG showed progressively worsening hypercarbia 7.1/>97/111 (per RT note, I do not see this in the results)  AMS, dropping O2 sat. ED attempted to change cuffless 5-0 trach to 4-0 cuffed shiley, unable to pass.  Her original trach was replaced,  And she was bagged through this per note.  She was then intubated from above with a 6-0 tube due to"stenotic and  small vocal cords". Initially only given 6m ketamine, then given a paralytic to complete the intubation.   There was some trauma due to attempt to replace trach.   Per ED note, concern from radiologist re R sided PE, heparin was started.    Past Medical History  HFpEF, obesity, OSA treated with trach, DM2, CKD, HTN, pulmonary HTN 2/2 OSA/CHF  Home meds listed: amlodipine, coreg, isordil, lisinopril, hydralazine, lasix,nystatin, flonase  Echo 2018:  EF 55%-60% Not  technically sufficient to allow evaluation of LV diastolic function. - Right ventricle: The cavity size was normal. Wall thickness was   normal. Systolic function  was normal. - Pulmonary arteries: Systolic pressure was within the normal   range. PA peak pressure: 15 mm Hg (S).  Diagnosed with pHTN in 2017, intubated for resp failrue, HTN, pulm edema, OHS/OSA.   Significant Hospital Events    12/16 failing wean due to secretions 12/17 blood clots suctioned out , out of bed to chair  Consults:    Procedures:  12/14 ETT >> 12/15 12/15 tracheostomy revised >>  Significant Diagnostic Tests:  CTA: 1. Nearly nondiagnostic study as detailed above. 2. No definite large centrally located pulmonary embolism. 3. Probable filling defect within the lobar branches on the right and segmental branches of the bilateral lower lobes. Findings could represent acute pulmonary emboli in the appropriate clinical setting. This is not well evaluated secondary to the limitations described above. 4. The RV/LV ratio measures 1.3 raising concern for right-sided heart strain. 5. Significantly dilated main pulmonary artery which can be seen in patients with pulmonary artery hypertension. 6. Streaky linear bilateral airspace opacities favored to represent atelectasis with developing infiltrates not excluded. 7. Low lung volumes. The tracheostomy tube terminates above the carina.  12/15 VQ scan negative 12/15 duplex-suboptimal negative 12/15 echo-normal LV function, surprisingly RV function normal   Micro Data:  12/15 tracheal aspirate >>  Antimicrobials:  12/14 Cefepime 12/14 Vanc 12/15 ceftx >> 12/15 doxy >>  Interim history/subjective:   Critically ill, on vent but stable Afebrile   Objective   Blood pressure 123/73, pulse 97,  temperature 98.6 F (37 C), temperature source Oral, resp. rate (!) 23, height 5' 5" (1.651 m), weight (!) 170 kg, SpO2 96 %, unknown if currently breastfeeding.    Vent Mode: PSV FiO2 (%):  [40 %-50 %] 50 % Set Rate:  [20 bmp] 20 bmp PEEP:  [5 cmH20] 5 cmH20 Pressure Support:  [18 cmH20] 18 cmH20 Plateau Pressure:  [28  cmH20-31 cmH20] 28 cmH20   Intake/Output Summary (Last 24 hours) at 08/10/2019 1012 Last data filed at 08/10/2019 0300 Gross per 24 hour  Intake 851.98 ml  Output 730 ml  Net 121.98 ml   Filed Weights   08/07/19 0500 08/08/19 0500 08/10/19 0600  Weight: (!) 181.4 kg (!) 171 kg (!) 170 kg    Examination: General: Morbidly obese, no distress, supine in bed HENT: Tracheostomy, mild pallor, no icterus Lungs:  decreased breath sounds bilateral Cardiovascular: RRR  Abdomen: obese, large pannus, nontender Extremities: 1+ edema Neuro: Awake and interactive, nonfocal grossly  Chest x-ray 12/1 18 personally reviewed , slightly improved aeration and decrease in bilateral pulmonary infiltrates/edema pattern   Resolved Hospital Problem list     Assessment & Plan:  38 year old woman with hx of HFpEF, obesity, OSA treated with trach, DM2, CKD, HTN, pulmonary HTN, admitted with hypoxic/hypercarbic respiratory failure likely due to blocked tracheostomy which was being downsized vs bilateral infiltrates?  Edema/atypical pneumonia  1.  Acute hypoxemic hypercarbic respiratory failure:  COVID negative.  Bilateral infiltrates likely due to negative pressure edema , but persistent so treating for pneumonia even though procalcitonin low PE ruled out  -CTX and doxy till 12/19 -Lowered PEEP to 5 -Continue weaning attempts with goal of trach collar eventually -progress seems to have been impeded by suctioning out blood clots likely due to airway bleeding during traumatic trach change   2.  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 Hx HTN  -Resume Coreg 25 twice daily -Hydralazine added overnight but can discontinue if blood pressure stays well  4. Hx diastolic CHF -Resume daily Lasix, replete hypokalemia and hypomagnesemia   5. AKI-resolved, creatinine back to baseline CKD stage II   Summary-slow progress on vent liberation likely due to combination of secretions/bleeding and edema/pneumonia , hopeful of progressing to trach collar soon  Best practice:  Diet: NPO for now Pain/Anxiety/Delirium protocol (if indicated):  dilaudid prn VAP protocol (if indicated): yes DVT prophylaxis: heparin SQ GI prophylaxis: ppi Glucose control: monitor glucose, ISS if needed Mobility: bed Code Status: full  Family Communication: Patient and mother Disposition: ICU  The patient is critically ill with multiple organ systems failure and requires high complexity decision making for assessment and support, frequent evaluation and titration of therapies, application of advanced monitoring technologies and extensive interpretation of multiple databases. Critical Care Time devoted to patient care services described in this note independent of APP/resident  time is 31 minutes.    Kara Mead MD. [facility] Flood. [facility]  If no response to pager , please call [phone]   08/10/2019

## 2019-08-10 NOTE — Progress Notes (Signed)
eLink Physician-Brief Progress Note Patient Name: [name] [name] DOB: [birth year] MRN: [id number]   Date of Service  08/10/2019  HPI/Events of Note  Hypoglycemia - Blood glucose = 81 --> 78.   eICU Interventions  Will order: 1. D5W to run IV at 30 mL/hour.     Intervention Category Major Interventions: Other:  Aydan Phoenix Cornelia Copa 08/10/2019, 4:10 AM

## 2019-08-10 NOTE — Progress Notes (Signed)
Nutrition Follow up  DOCUMENTATION CODES:   Morbid obesity  INTERVENTION:   Tube Feeding:  Vital High Protein at 60 ml/hr Provides 1440 kcals, 127 g of protein and 1210 mL of free water Meets 100% estimated calorie and protein needs  NUTRITION DIAGNOSIS:   Inadequate oral intake related to acute illness as evidenced by NPO status.  Ongoing  GOAL:   Patient will meet greater than or equal to 90% of their needs   Addressed via TF  MONITOR:   Vent status, Labs, TF tolerance, Weight trends, Skin, Diet advancement  REASON FOR ASSESSMENT:   Consult, Ventilator Enteral/tube feeding initiation and management  ASSESSMENT:   38 yo female admitted with acute on chronic respiratory failure with b/l infiltrates requiring vent support via trach; PMH includes HTN, CHF, CKD II, DM, OSA, obesity   RD working remotely.  Clots suctioned out of trach this am. Unable to downsize. Continues to require vent support. Per RN, pt tolerating Vital High Protein at 20 ml/hr. Abdomen soft. Having BMs. Pt complains of slight abdominal pain. Will titrate to goal slowly and monitor toleration.   Admission weight: 181.2 kg Current weight: 170 kg   Patient requiring ventilator support via trach MV: 9.3 L/min Temp (24hrs), Avg:98.6 F (37 C), Min:98.4 F (36.9 C), Max:99.1 F (37.3 C)   I/O: -2,595 ml since admit UOP: 700 ml x 24 hrs    Drips: D5 @ 30 ml/hr  Medications: 40 mg lasix daily Labs: K 3.1 (L) CBG 78-119  Diet Order:   Diet Order            Diet NPO time specified  Diet effective now              EDUCATION NEEDS:   Not appropriate for education at this time  Skin:  Skin Assessment: Skin Integrity Issues: Skin Integrity Issues:: Other (Comment) Other: MASD-buttocks  Last BM:  12/17  Height:   Ht Readings from Last 1 Encounters:  08/06/19 5' 5" (1.651 m)    Weight:   Wt Readings from Last 1 Encounters:  08/10/19 (!) 170 kg    Ideal Body Weight:  56.8  kg  BMI:  Body mass index is 62.37 kg/m.  Estimated Nutritional Needs:   Kcal:  1254-1425 kcals  Protein:  114-142 g  Fluid:  >/= 1.8 L  Mariana Single RD, LDN Clinical Nutrition Pager # [phone]

## 2019-08-11 DIAGNOSIS — J9622 Acute and chronic respiratory failure with hypercapnia: Secondary | ICD-10-CM

## 2019-08-11 DIAGNOSIS — J9621 Acute and chronic respiratory failure with hypoxia: Principal | ICD-10-CM

## 2019-08-11 LAB — CBC
HCT: 44.6 % (ref 36.0–46.0)
Hemoglobin: 13.5 g/dL (ref 12.0–15.0)
MCH: 27.6 pg (ref 26.0–34.0)
MCHC: 30.3 g/dL (ref 30.0–36.0)
MCV: 91.2 fL (ref 80.0–100.0)
Platelets: 150 10*3/uL (ref 150–400)
RBC: 4.89 MIL/uL (ref 3.87–5.11)
RDW: 18 % — ABNORMAL HIGH (ref 11.5–15.5)
WBC: 5.3 10*3/uL (ref 4.0–10.5)
nRBC: 0 % (ref 0.0–0.2)

## 2019-08-11 LAB — GLUCOSE, CAPILLARY
Glucose-Capillary: 102 mg/dL — ABNORMAL HIGH (ref 70–99)
Glucose-Capillary: 103 mg/dL — ABNORMAL HIGH (ref 70–99)
Glucose-Capillary: 111 mg/dL — ABNORMAL HIGH (ref 70–99)
Glucose-Capillary: 114 mg/dL — ABNORMAL HIGH (ref 70–99)
Glucose-Capillary: 116 mg/dL — ABNORMAL HIGH (ref 70–99)
Glucose-Capillary: 95 mg/dL (ref 70–99)

## 2019-08-11 LAB — BASIC METABOLIC PANEL
Anion gap: 11 (ref 5–15)
BUN: 12 mg/dL (ref 6–20)
CO2: 30 mmol/L (ref 22–32)
Calcium: 8.5 mg/dL — ABNORMAL LOW (ref 8.9–10.3)
Chloride: 97 mmol/L — ABNORMAL LOW (ref 98–111)
Creatinine, Ser: 0.89 mg/dL (ref 0.44–1.00)
GFR calc Af Amer: >60 mL/min (ref >60)
GFR calc non Af Amer: >60 mL/min (ref >60)
Glucose, Bld: 112 mg/dL — ABNORMAL HIGH (ref 70–99)
Potassium: 4.2 mmol/L (ref 3.5–5.1)
Sodium: 138 mmol/L (ref 135–145)

## 2019-08-11 MED ORDER — IPRATROPIUM-ALBUTEROL 0.5-2.5 (3) MG/3ML IN SOLN
3.0000 mL | Freq: Two times a day (BID) | RESPIRATORY_TRACT | Status: DC
  Administered 2019-08-11 – 2019-08-13 (×5): 3 mL via RESPIRATORY_TRACT
  Filled 2019-08-11 (×5): qty 3

## 2019-08-11 MED ORDER — MAGNESIUM SULFATE 2 GM/50ML IV SOLN
2.0000 g | Freq: Once | INTRAVENOUS | Status: AC
  Administered 2019-08-11: 2 g via INTRAVENOUS
  Filled 2019-08-11: qty 50

## 2019-08-11 NOTE — Progress Notes (Signed)
NAME:  [name] [name], MRN:  [id number], DOB:  11-30-1980, LOS: 4 ADMISSION DATE:  08/06/2019, CONSULTATION DATE:  08/07/19 REFERRING MD:  ED, CHIEF COMPLAINT:  Shortness of breath, hypoxemia  Brief History   [name] is a 38 year old woman with a history of HFpEF, obesity, OSA/OHS treated with trach after critical illness 2017, DM2, CKD, HTN, pulmonary HTN ,   Admitted with acute hypercarbic respiratory failure after attempted trach downsize    In anticipation of trach change, she was undergoing covid testing, while there she was noted to have an O2 sat of 60%, and she was sent to the ER.  She noted that she had some shortness of breath and decreased energy over the past several weeks.  Denied cough, congestions, fever.   Plan had been for gradual downsizing of trach with goal of removal.     Initially also found to be hypercarbic in the ED.  7.315/61.6/27   Follow up ABG showed progressively worsening hypercarbia 7.1/>97/111 (per RT note, I do not see this in the results)  AMS, dropping O2 sat. ED attempted to change cuffless 5-0 trach to 4-0 cuffed shiley, unable to pass.  Her original trach was replaced,  And she was bagged through this per note.  She was then intubated from above with a 6-0 tube due to"stenotic and  small vocal cords". Initially only given 16m ketamine, then given a paralytic to complete the intubation.   There was some trauma due to attempt to replace trach.   Per ED note, concern from radiologist re R sided PE, heparin was started.    Past Medical History  HFpEF, obesity, OSA treated with trach, DM2, CKD, HTN, pulmonary HTN 2/2 OSA/CHF  Home meds listed: amlodipine, coreg, isordil, lisinopril, hydralazine, lasix,nystatin, flonase  Echo 2018:  EF 55%-60% Not  technically sufficient to allow evaluation of LV diastolic function. - Right ventricle: The cavity size was normal. Wall thickness was   normal. Systolic function was normal. - Pulmonary arteries:  Systolic pressure was within the normal   range. PA peak pressure: 15 mm Hg (S).  Diagnosed with pHTN in 2017, intubated for resp failrue, HTN, pulm edema, OHS/OSA.   Significant Hospital Events    12/16 failing wean due to secretions 12/17 blood clots suctioned out , out of bed to chair  12/18 0 Critically ill, on vent but stable. Afebrile   Consults:    Procedures:  12/14 ETT >> 12/15 12/15 tracheostomy revised >>  Significant Diagnostic Tests:  CTA: 1. Nearly nondiagnostic study as detailed above. 2. No definite large centrally located pulmonary embolism. 3. Probable filling defect within the lobar branches on the right and segmental branches of the bilateral lower lobes. Findings could represent acute pulmonary emboli in the appropriate clinical setting. This is not well evaluated secondary to the limitations described above. 4. The RV/LV ratio measures 1.3 raising concern for right-sided heart strain. 5. Significantly dilated main pulmonary artery which can be seen in patients with pulmonary artery hypertension. 6. Streaky linear bilateral airspace opacities favored to represent atelectasis with developing infiltrates not excluded. 7. Low lung volumes. The tracheostomy tube terminates above the carina.  12/15 VQ scan negative 12/15 duplex-suboptimal negative 12/15 echo-normal LV function, surprisingly RV function normal   Micro Data:  12/15 tracheal aspirate >>  Antimicrobials:  12/14 Cefepime 12/14 Vanc 12/15 ceftx >> 12/15 doxy >>  Interim history/subjective:    12/19 - doing SBT.  On 50% fio2 via trrach. Not on sedation gtt.  On TF. Sittign on chair and texting on phone  Objective   Blood pressure 128/81, pulse 83, temperature 98.4 F (36.9 C), temperature source Oral, resp. rate (!) 22, height _0  (1.651 m), weight (!) 179.8 kg, SpO2 97 %, unknown if currently breastfeeding.    Vent Mode: PSV;CPAP FiO2 (%):  [40 %-50 %] 50 % Set Rate:  [20 bmp]  20 bmp PEEP:  [5 cmH20] 5 cmH20 Pressure Support:  [12 cmH20-18 cmH20] 12 cmH20 Plateau Pressure:  [27 cmH20-29 cmH20] 27 cmH20   Intake/Output Summary (Last 24 hours) at 08/11/2019 0924 Last data filed at 08/11/2019 0600 Gross per 24 hour  Intake 1221.1 ml  Output --  Net 1221.1 ml   Filed Weights   08/08/19 0500 08/10/19 0600 08/11/19 0500  Weight: (!) 171 kg (!) 170 kg (!) 179.8 kg    General Appearance:  Looks well. Morbid obese. Sitting on chair . Texting Head:  Normocephalic, without obvious abnormality, atraumatic Eyes:  PERRL - yes, conjunctiva/corneas - muddy     Ears:  Normal external ear canals, both ears Nose:  G tube - TF +.  Throat:  ETT TUBE - no , OG tube - no. TRACH + Neck:  Supple,  No enlargement/tenderness/nodules Lungs: Clear to auscultation bilaterally, Ventilator   Synchrony - yes on PSV Heart:  S1 and S2 normal, no murmur, CVP - no.  Pressors - no Abdomen:  Soft, no masses, no organomegaly. OBESE with PANNUS + Genitalia / Rectal:  Not done Extremities:  Extremities- intact Skin:  ntact in exposed areas . Sacral area - not examined Neurologic:  Sedation - none -> RASS - +1 . Moves all 4s - yes. CAM-ICU - neg . Orientation - x3 +       Resolved Hospital Problem list     Assessment & Plan:  38 year old woman with hx of HFpEF, obesity, OSA treated with trach, DM2, CKD, HTN, pulmonary HTN, admitted with hypoxic/hypercarbic respiratory failure likely due to blocked tracheostomy which was being downsized vs bilateral infiltrates?  Edema/atypical pneumonia  1.  Acute on chronic hypoxemic hypercarbic respiratory failure:  COVID negative.  Bilateral infiltrates likely due to negative pressure edema , but persistent so treating for pneumonia even though procalcitonin low PE ruled out  08/11/2019 - doing PSV via Trach  plan -CTX and doxy till 12/19 -Lowered PEEP to 5 -Continue weaning attempts with goal of trach collar eventually -progress seems to have  been impeded by suctioning out blood clots likely due to airway bleeding during traumatic trach change   2.  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 Hx HTN  -Resume Coreg 25 twice daily -Hydralazine added overnight but can discontinue if blood pressure stays well  4. Hx diastolic CHF -Resume daily Lasix,    5. AKI-resolved, creatinine back to baseline CKD stage II   6.Electrolyte imbalance  Plan  - replete mag 08/11/2019      Summary-slow progress on vent liberation likely due to combination of secretions/bleeding and edema/pneumonia , hopeful of progressing to trach collar soon  Best practice:  Diet: NPO for now Pain/Anxiety/Delirium protocol (if indicated):  dilaudid prn VAP protocol (if indicated): yes DVT prophylaxis: heparin SQ GI prophylaxis: ppi Glucose control: monitor glucose, ISS if needed Mobility: bed Code Status: full  Family Communication: Patient and mother Disposition: ICU -> progressvive in 36M 08/11/2019 - ccm primary  for now and the consider triad transfer   ATTESTATION & SIGNATURE     Dr. Brand Males, M.D., [facility].C.P Pulmonary and Critical Care Medicine Staff Physician [facility] Pager: [phone], If no answer or between  15:00h - 7:00h: call [phone]  08/11/2019 9:26 AM     LABS    PULMONARY Recent Labs  Lab 08/06/19 1828 08/07/19 0258 08/07/19 0617  PHART 7.315* 7.193* 7.286*  PCO2ART 61.6* 82.2* 71.6*  PO2ART 27.0* 84.0 195.0*  HCO3 31.5* 31.6* 34.3*  TCO2 33* 34* 36*  O2SAT 45.0 92.0 100.0    CBC Recent Labs  Lab 08/09/19 0224 08/10/19 0503 08/11/19 0357  HGB 13.8 13.6 13.5  HCT 44.0 44.3 44.6  WBC 5.4 5.5 5.3  PLT 151 149* 150    COAGULATION No results for input(s): INR in the  last 168 hours.  CARDIAC  No results for input(s): TROPONINI in the last 168 hours. No results for input(s): PROBNP in the last 168 hours.   CHEMISTRY Recent Labs  Lab 08/07/19 0456 08/07/19 0617 08/08/19 0303 08/09/19 0224 08/10/19 0503 08/11/19 0357  NA 144 143 141 142 139 138  K 4.8 3.9 3.4* 3.2* 3.1* 4.2  CL 104  --  100 97* 95* 97*  CO2 27  --  _0 GLUCOSE 90  --  81 79 91 112*  BUN 22*  --  _1 CREATININE 1.48*  --  1.29* 1.21* 1.13* 0.89  CALCIUM 8.4*  --  8.4* 8.3* 8.5* 8.5*  MG 2.3  --  1.8 1.7 1.9  --   PHOS 4.6  --  2.0* 3.7  --   --    Estimated Creatinine Clearance: 140.7 mL/min (by C-G formula based on SCr of 0.89 mg/dL).   LIVER Recent Labs  Lab 08/06/19 1833  AST 17  ALT 17  ALKPHOS 77  BILITOT 0.6  PROT 7.7  ALBUMIN 3.2*     INFECTIOUS Recent Labs  Lab 08/06/19 1826 08/07/19 0456 08/08/19 0303 08/09/19 0224  LATICACIDVEN 1.4  --   --   --   PROCALCITON  --  0.22 0.16 0.24     ENDOCRINE CBG (last 3)  Recent Labs    08/11/19 0011 08/11/19 0426 08/11/19 0737  GLUCAP 95 111* 116*         IMAGING x48h  - image(s) personally visualized  -   highlighted in bold DG Abd 1 View  Result Date: 08/09/2019 CLINICAL DATA:  NG tube placement. EXAM: ABDOMEN - 1 VIEW COMPARISON:  Chest x-ray 08/07/2019. FINDINGS: NG tube noted with tip over the distal stomach in good anatomic position. Soft tissue structures are unremarkable. No bowel distention or free air. Degenerative change lumbar spine and both hips. IMPRESSION: NG tube noted with its tip over the distal stomach in good anatomic position. Electronically Signed   By: Marcello Moores  Register   On: 08/09/2019 12:23   DG Chest Port 1 View  Result Date: 08/10/2019 CLINICAL DATA:  Tracheostomy.  Respiratory failure. EXAM: PORTABLE CHEST 1 VIEW COMPARISON:  08/07/2019. FINDINGS: Interim placement of NG tube, its tip is coiled in the stomach. Tracheostomy tube in stable position.  Severe cardiomegaly again noted. Diffuse bilateral infiltrates/edema again noted. Findings most consistent CHF. Low lung volumes. Slight improved aeration on today's exam. No prominent pleural effusion. No pneumothorax. IMPRESSION: 1. Interim placement of NG tube, its tip is coiled in the stomach. Tracheostomy  tube in stable position. 2. Severe cardiomegaly with diffuse bilateral pulmonary infiltrates/edema again noted. Findings most consistent with CHF. Slight improved aeration on today's exam. Electronically Signed   By: Marcello Moores  Register   On: 08/10/2019 07:07

## 2019-08-11 NOTE — Progress Notes (Signed)
1806: Large brown/tan mucous plug removed via tracheal suction with 14 french cath at this time. Fio2 and flow increased. RT called to bedside.

## 2019-08-11 NOTE — Progress Notes (Signed)
RT note: patient placed on CPAP/PSV of 12/5 at 0754.  Currently tolerating well.  Will continue to monitor.

## 2019-08-11 NOTE — Progress Notes (Signed)
Pt was placed on trach collar 40%/10L and is currently saturating 96%. Pt is stable and comfortable at this time. RT will continue to monitor.

## 2019-08-12 ENCOUNTER — Inpatient Hospital Stay (HOSPITAL_COMMUNITY): Payer: Medicare Other

## 2019-08-12 LAB — CBC
HCT: 45.4 % (ref 36.0–46.0)
Hemoglobin: 13.4 g/dL (ref 12.0–15.0)
MCH: 27.2 pg (ref 26.0–34.0)
MCHC: 29.5 g/dL — ABNORMAL LOW (ref 30.0–36.0)
MCV: 92.1 fL (ref 80.0–100.0)
Platelets: 165 10*3/uL (ref 150–400)
RBC: 4.93 MIL/uL (ref 3.87–5.11)
RDW: 17.7 % — ABNORMAL HIGH (ref 11.5–15.5)
WBC: 5.1 10*3/uL (ref 4.0–10.5)
nRBC: 0 % (ref 0.0–0.2)

## 2019-08-12 LAB — GLUCOSE, CAPILLARY
Glucose-Capillary: 106 mg/dL — ABNORMAL HIGH (ref 70–99)
Glucose-Capillary: 125 mg/dL — ABNORMAL HIGH (ref 70–99)
Glucose-Capillary: 126 mg/dL — ABNORMAL HIGH (ref 70–99)
Glucose-Capillary: 129 mg/dL — ABNORMAL HIGH (ref 70–99)
Glucose-Capillary: 131 mg/dL — ABNORMAL HIGH (ref 70–99)
Glucose-Capillary: 135 mg/dL — ABNORMAL HIGH (ref 70–99)

## 2019-08-12 MED ORDER — ORAL CARE MOUTH RINSE: 15.0000 mL | Freq: Two times a day (BID) | OROMUCOSAL | Status: DC

## 2019-08-12 MED ORDER — CHLORHEXIDINE GLUCONATE 0.12 % MT SOLN
15.0000 mL | Freq: Two times a day (BID) | OROMUCOSAL | Status: DC
  Administered 2019-08-13 – 2019-08-14 (×3): 15 mL via OROMUCOSAL
  Filled 2019-08-12 (×3): qty 15

## 2019-08-12 NOTE — Progress Notes (Addendum)
NAME:  [name] [name], MRN:  [id number], DOB:  September 24, 1980, LOS: 5 ADMISSION DATE:  08/06/2019, CONSULTATION DATE:  08/07/19 REFERRING MD:  ED, CHIEF COMPLAINT:  Shortness of breath, hypoxemia  Brief History   [name] [name] is a 38 year old woman with a history of HFpEF, obesity, OSA/OHS treated with trach after critical illness 2017, DM2, CKD, HTN, pulmonary HTN ,   Admitted with acute hypercarbic respiratory failure after attempted trach downsize    In anticipation of trach change, she was undergoing covid testing, while there she was noted to have an O2 sat of 60%, and she was sent to the ER.  She noted that she had some shortness of breath and decreased energy over the past several weeks.  Denied cough, congestions, fever.   Plan had been for gradual downsizing of trach with goal of removal.     Initially also found to be hypercarbic in the ED.  7.315/61.6/27   Follow up ABG showed progressively worsening hypercarbia 7.1/>97/111 (per RT note, I do not see this in the results)  AMS, dropping O2 sat. ED attempted to change cuffless 5-0 trach to 4-0 cuffed shiley, unable to pass.  Her original trach was replaced,  And she was bagged through this per note.  She was then intubated from above with a 6-0 tube due to"stenotic and  small vocal cords". Initially only given 56m ketamine, then given a paralytic to complete the intubation.   There was some trauma due to attempt to replace trach.   Per ED note, concern from radiologist re R sided PE, heparin was started.    Past Medical History  HFpEF, obesity, OSA treated with trach, DM2, CKD, HTN, pulmonary HTN 2/2 OSA/CHF  Home meds listed: amlodipine, coreg, isordil, lisinopril, hydralazine, lasix,nystatin, flonase  Echo 2018:  EF 55%-60% Not  technically sufficient to allow evaluation of LV diastolic function. - Right ventricle: The cavity size was normal. Wall thickness was   normal. Systolic function was normal. - Pulmonary arteries:  Systolic pressure was within the normal   range. PA peak pressure: 15 mm Hg (S).  Diagnosed with pHTN in 2017, intubated for resp failrue, HTN, pulm edema, OHS/OSA.   Significant Hospital Events    12/16 failing wean due to secretions 12/17 blood clots suctioned out , out of bed to chair  12/18 0 Critically ill, on vent but stable. Afebrile   12/19 -  doing SBT.  On 50% fio2 via trrach. Not on sedation gtt. On TF. Sittign on chair and texting on phone  Consults:    Procedures:  12/14 ETT >> 12/15 12/15 tracheostomy revised >>  Significant Diagnostic Tests:  CTA: 1. Nearly nondiagnostic study as detailed above. 2. No definite large centrally located pulmonary embolism. 3. Probable filling defect within the lobar branches on the right and segmental branches of the bilateral lower lobes. Findings could represent acute pulmonary emboli in the appropriate clinical setting. This is not well evaluated secondary to the limitations described above. 4. The RV/LV ratio measures 1.3 raising concern for right-sided heart strain. 5. Significantly dilated main pulmonary artery which can be seen in patients with pulmonary artery hypertension. 6. Streaky linear bilateral airspace opacities favored to represent atelectasis with developing infiltrates not excluded. 7. Low lung volumes. The tracheostomy tube terminates above the carina.  12/15 VQ scan negative 12/15 duplex-suboptimal negative 12/15 echo-normal LV function, surprisingly RV function normal   Micro Data:  12/15 tracheal aspirate >> neg 12/15 - MRSA PCR - neg 12/14 -  0 covid -pcr - neg 12/14- flu pcr - neg       Antimicrobials:  12/14 Cefepime 12/14 12/14 Vanc 12/15 12/15 ceftx >>12/19 12/15 doxy >>1219  .    Interim history/subjective:    12/19 - off all abx. Culture negative. This AM on ATC 40% wih 10L. Mucus plug removed. Hass PMV today for first time. Cuff is deflated. She is sitting and talking. No  distress. Reports feeling well  Objective   Blood pressure 127/70, pulse 81, temperature 98.5 F (36.9 C), temperature source Oral, resp. rate (!) 25, height _0  (1.651 m), weight (!) 179.8 kg, SpO2 97 %, unknown if currently breastfeeding.    Vent Mode: CPAP;PSV FiO2 (%):  [40 %-50 %] 40 % PEEP:  [5 cmH20] 5 cmH20 Pressure Support:  [12 cmH20] 12 cmH20   Intake/Output Summary (Last 24 hours) at 08/12/2019 1106 Last data filed at 08/12/2019 0800 Gross per 24 hour  Intake 1937.55 ml  Output 1650 ml  Net 287.55 ml   Filed Weights   08/08/19 0500 08/10/19 0600 08/11/19 0500  Weight: (!) 171 kg (!) 170 kg (!) 179.8 kg    General Appearance:  Looks well. Morbid obese. Sitting on chair . Texting Head:  Normocephalic, without obvious abnormality, atraumatic Eyes:  PERRL - yes, conjunctiva/corneas - muddy     Ears:  Normal external ear canals, both ears Nose:  G tube - TF +.  Throat:  ETT TUBE - no , OG tube - no. TRACH + Neck:  Supple,  No enlargement/tenderness/nodules Lungs: Clear to auscultation bilaterally, Ventilator   Synchrony - yes on PSV Heart:  S1 and S2 normal, no murmur, CVP - no.  Pressors - no Abdomen:  Soft, no masses, no organomegaly. OBESE with PANNUS + Genitalia / Rectal:  Not done Extremities:  Extremities- intact Skin:  ntact in exposed areas . Sacral area - not examined Neurologic:  Sedation - none -> RASS - +1 . Moves all 4s - yes. CAM-ICU - neg . Orientation - x3 +       Resolved Hospital Problem list     Assessment & Plan:  38 year old woman with hx of HFpEF, obesity, OSA treated with trach, DM2, CKD, HTN, pulmonary HTN, admitted with hypoxic/hypercarbic respiratory failure likely due to blocked tracheostomy which was being downsized vs bilateral infiltrates?  Edema/atypical pneumonia  1.   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  Bilateral infiltrates likely due to negative pressure edema , but persistent so treating for pneumonia even though procalcitonin low PE ruled out  08/12/2019 - on ATC. Off abx plan - ATC Day  - PSV night - no decannulatin - Trach clinic followup with [name]  Hx HTN   - 08/12/2019 - BP ok  plan Coreg 25 twice daily -Hydralazine added overnight but can discontinue if blood pressure stays well  Hx diastolic CHF  Plan -Resume daily Lasix,    AKI-resolved, creatinine back to baseline CKD stage II  Plan  - monitor    6.Electrolyte imbalance  08/12/2019 = mag repleted yesterday  Plan  - monitor     Summary-slow progress on vent liberation likely due to combination of secretions/bleeding and edema/pneumonia ,      Best practice:  Diet: NPO for now Pain/Anxiety/Delirium protocol (if indicated):  dilaudid prn VAP protocol (if indicated): yes DVT prophylaxis: heparin SQ GI prophylaxis: ppi Glucose control: monitor glucose, ISS if needed Mobility: bed Code Status: full  Family Communication: Patient Disposition: ICU -> move to progressive. Paged triad to accept patient as primary 08/13/19 with ccm consult - d/w Dr Joelene Millin - they will be primary 08/13/19 and ccm consult 2x/week  ATTESTATION & SIGNATURE     Dr. Brand Males, M.D., [facility].C.P Pulmonary and Critical Care Medicine Staff Physician [facility] Pager: [phone], If no answer or between  15:00h - 7:00h: call [phone]  08/12/2019 11:06 AM     LABS    PULMONARY Recent Labs  Lab 08/06/19 1828 08/07/19 0258 08/07/19 0617  PHART 7.315* 7.193* 7.286*  PCO2ART 61.6* 82.2* 71.6*  PO2ART 27.0* 84.0 195.0*  HCO3 31.5* 31.6* 34.3*  TCO2 33* 34* 36*  O2SAT 45.0 92.0 100.0     CBC Recent Labs  Lab 08/10/19 0503 08/11/19 0357 08/12/19 0254  HGB 13.6 13.5 13.4  HCT 44.3 44.6 45.4  WBC 5.5 5.3 5.1  PLT 149* 150 165    COAGULATION No results for input(s): INR in the last 168 hours.  CARDIAC  No results for input(s): TROPONINI in the last 168 hours. No results for input(s): PROBNP in the last 168 hours.   CHEMISTRY Recent Labs  Lab 08/07/19 0456 08/07/19 0617 08/08/19 0303 08/09/19 0224 08/10/19 0503 08/11/19 0357  NA 144 143 141 142 139 138  K 4.8 3.9 3.4* 3.2* 3.1* 4.2  CL 104  --  100 97* 95* 97*  CO2 27  --  _0 GLUCOSE 90  --  81 79 91 112*  BUN 22*  --  _1 CREATININE 1.48*  --  1.29* 1.21* 1.13* 0.89  CALCIUM 8.4*  --  8.4* 8.3* 8.5* 8.5*  MG 2.3  --  1.8 1.7 1.9  --   PHOS 4.6  --  2.0* 3.7  --   --    Estimated Creatinine Clearance: 140.7 mL/min (by C-G formula based on SCr of 0.89 mg/dL).   LIVER Recent Labs  Lab 08/06/19 1833  AST 17  ALT 17  ALKPHOS 77  BILITOT 0.6  PROT 7.7  ALBUMIN 3.2*     INFECTIOUS Recent Labs  Lab 08/06/19 1826 08/07/19 0456 08/08/19 0303 08/09/19 0224  LATICACIDVEN 1.4  --   --   --   PROCALCITON  --  0.22 0.16 0.24     ENDOCRINE CBG (last 3)  Recent Labs    08/12/19 0006 08/12/19 0352 08/12/19 0800  GLUCAP 129* 106* 125*         IMAGING x48h  - image(s) personally visualized  -   highlighted in bold No results found.

## 2019-08-12 NOTE — Evaluation (Signed)
Clinical/Bedside Swallow Evaluation Patient Details  Name: [name] [name] MRN: [id number] Date of Birth: [birth year]  Today's Date: 08/12/2019 Time: SLP Start Time (ACUTE ONLY): 51 SLP Stop Time (ACUTE ONLY): 1100 SLP Time Calculation (min) (ACUTE ONLY): 20 min  Past Medical History:  Past Medical History:  Diagnosis Date  . Chronic diastolic heart failure ([place])    Echo 10/12: EF 50-55 // b. Echo 10/17: EF 50-55, mild BAE, PASP 42 // Echo 10/18: Mild concentric LVH, EF 55-60, normal wall motion, mild MAC, severe LAE, mild TR, PASP 15, trivial pericardial effusion  . CKD (chronic kidney disease) stage 4, GFR 15-29 ml/min (HCC) 10/25/2016   admitted in 2/18 with SCr of 10 in the setting of dehydration from illness, etc >> improved to 5 at DC  . DM2 (diabetes mellitus, type 2) (HCC)    A1c 7.2 in 05/2016  . Gestational diabetes mellitus in pregnancy 05/24/2013  . HTN in pregnancy, chronic 05/24/2013  . Hypertensive heart disease with CHF (congestive heart failure) ([place]) 05/25/2016  . Morbid obesity ([place])   . OSA (obstructive sleep apnea) 07/09/2011   s/p Trach 2/2 prolonged respiratory failure during admx for CHF in 05/2016  . Pulmonary hypertension ([place])    secondary from CHF/OSA  . Sinus tachycardia    Past Surgical History:  Past Surgical History:  Procedure Laterality Date  . TRACHEOSTOMY TUBE PLACEMENT N/A 06/11/2016   Procedure: TRACHEOSTOMY;  Surgeon: Melida Quitter, MD;  Location: [facility];  Service: ENT;  Laterality: N/A;   HPI:  [name] is a 38 year old woman with a history of HFpEF, obesity, OSA/OHS treated with trach after critical illness 2017, DM2, CKD, HTN, pulmonary HTN. Had FEES ain 2017 found to have persistent posterior glottic chink, no aspiration. Consumes regular diet and thin liquids at baseline. Wore a red cap during waking hours and removed for sleep due to sleep apnea. Admitted with acute hypercarbic respiratory failure after attempted trach downsize.  In  anticipation of trach change, she was undergoing covid testing, while there she was noted to have an O2 sat of 60%, and she was sent to the ER. ED attempted to change cuffless 5-0 trach to 4-0 cuffed shiley, unable to pass.  Her original trach was replaced,  And she was bagged through this per note.  She was then intubated from above with a 6-0 tube due to"stenotic and  small vocal cords". Initially only given 34m ketamine, then given a paralytic to complete the intubation.   There was some trauma due to attempt to replace trach. Has been strugglling with bloody mucus plugs during admission likely due to trauma with attempts to change trach. Pt has been on daytime trach collar night time vent since 12/19.    Assessment / Plan / Recommendation Clinical Impression  Pts NG tube slid out during toileting at the beginning of session. After tolerance with PMSV established SLP observed pt with ice chips, sips, 3 oz water swallow, puree and regular textures. No signs of dysphagia or aspiration observed. Pt has been eating and drinking with a trach for years. Though she has had some additional trauma from intubation and failed trach replacement, her ability to swallow appears [facility] at baseline. Pt may resume a regular diet and thin liquids when PMSV is in place. Will f/u briefly for tolerance.  SLP Visit Diagnosis: Dysphagia, unspecified (R13.10)    Aspiration Risk  Mild aspiration risk    Diet Recommendation Regular;Thin liquid   Liquid Administration via: Cup;Straw Medication  Administration: Whole meds with liquid Supervision: Patient able to self feed Compensations: Slow rate;Small sips/bites Postural Changes: Seated upright at 90 degrees    Other  Recommendations Other Recommendations: Place PMSV during PO intake;Have oral suction available   Follow up Recommendations 24 hour supervision/assistance      Frequency and Duration min 1 x/week  1 week       Prognosis Prognosis for Safe Diet  Advancement: Good      Swallow Study   General HPI: [name] is a 38 year old woman with a history of HFpEF, obesity, OSA/OHS treated with trach after critical illness 2017, DM2, CKD, HTN, pulmonary HTN. Had FEES ain 2017 found to have persistent posterior glottic chink, no aspiration. Consumes regular diet and thin liquids at baseline. Wore a red cap during waking hours and removed for sleep due to sleep apnea. Admitted with acute hypercarbic respiratory failure after attempted trach downsize.  In anticipation of trach change, she was undergoing covid testing, while there she was noted to have an O2 sat of 60%, and she was sent to the ER. ED attempted to change cuffless 5-0 trach to 4-0 cuffed shiley, unable to pass.  Her original trach was replaced,  And she was bagged through this per note.  She was then intubated from above with a 6-0 tube due to"stenotic and  small vocal cords". Initially only given 68m ketamine, then given a paralytic to complete the intubation.   There was some trauma due to attempt to replace trach. Has been strugglling with bloody mucus plugs during admission likely due to trauma with attempts to change trach. Pt has been on daytime trach collar night time vent since 12/19.  Type of Study: Bedside Swallow Evaluation Previous Swallow Assessment: see HPI Diet Prior to this Study: NPO;NG Tube Temperature Spikes Noted: No Respiratory Status: Trach Collar History of Recent Intubation: Yes Length of Intubations (days): 1 days Behavior/Cognition: Alert;Cooperative;Pleasant mood Oral Cavity Assessment: Within Functional Limits Oral Care Completed by SLP: No Oral Cavity - Dentition: Adequate natural dentition Vision: Functional for self-feeding Self-Feeding Abilities: Able to feed self Patient Positioning: Upright in chair Baseline Vocal Quality: Hoarse;Breathy Volitional Cough: Strong Volitional Swallow: Able to elicit    Oral/Motor/Sensory Function     Ice Chips Ice  chips: Within functional limits   Thin Liquid Thin Liquid: Within functional limits Presentation: Cup;Straw;Self Fed    Nectar Thick Nectar Thick Liquid: Not tested   Honey Thick Honey Thick Liquid: Not tested   Puree Puree: Within functional limits   Solid     Solid: Within functional limits Presentation: SMahnomenDeBlois, MA CLittle HockingPager [phone] [phone] DLynann Beaver12/20/2020,12:07 PM

## 2019-08-12 NOTE — Progress Notes (Signed)
Pt was placed on trach collar 40%/10L and is comfortable at this time. RT will continue to monitor.

## 2019-08-12 NOTE — Evaluation (Signed)
[facility] Speaking Valve - Evaluation Patient Details  Name: [name] [name] MRN: [id number] Date of Birth: 08-25-1980  Today's Date: 08/12/2019 Time: 1040-1100 SLP Time Calculation (min) (ACUTE ONLY): 20 min  Past Medical History:  Past Medical History:  Diagnosis Date  . Chronic diastolic heart failure ([place])    Echo 10/12: EF 50-55 // b. Echo 10/17: EF 50-55, mild BAE, PASP 42 // Echo 10/18: Mild concentric LVH, EF 55-60, normal wall motion, mild MAC, severe LAE, mild TR, PASP 15, trivial pericardial effusion  . CKD (chronic kidney disease) stage 4, GFR 15-29 ml/min (HCC) 10/25/2016   admitted in 2/18 with SCr of 10 in the setting of dehydration from illness, etc >> improved to 5 at DC  . DM2 (diabetes mellitus, type 2) (HCC)    A1c 7.2 in 05/2016  . Gestational diabetes mellitus in pregnancy 05/24/2013  . HTN in pregnancy, chronic 05/24/2013  . Hypertensive heart disease with CHF (congestive heart failure) ([place]) 05/25/2016  . Morbid obesity ([place])   . OSA (obstructive sleep apnea) 07/09/2011   s/p Trach 2/2 prolonged respiratory failure during admx for CHF in 05/2016  . Pulmonary hypertension ([place])    secondary from CHF/OSA  . Sinus tachycardia    Past Surgical History:  Past Surgical History:  Procedure Laterality Date  . TRACHEOSTOMY TUBE PLACEMENT N/A 06/11/2016   Procedure: TRACHEOSTOMY;  Surgeon: Melida Quitter, MD;  Location: [facility];  Service: ENT;  Laterality: N/A;   HPI:  [name] [name] is a 38 year old woman with a history of HFpEF, obesity, OSA/OHS treated with trach after critical illness 2017, DM2, CKD, HTN, pulmonary HTN. Had FEES ain 2017 found to have persistent posterior glottic chink, no aspiration. Consumes regular diet and thin liquids at baseline. Wore a red cap during waking hours and removed for sleep due to sleep apnea. Admitted with acute hypercarbic respiratory failure after attempted trach downsize.  In anticipation of trach change, she was undergoing covid  testing, while there she was noted to have an O2 sat of 60%, and she was sent to the ER. ED attempted to change cuffless 5-0 trach to 4-0 cuffed shiley, unable to pass.  Her original trach was replaced,  And she was bagged through this per note.  She was then intubated from above with a 6-0 tube due to"stenotic and  small vocal cords". Initially only given 50m ketamine, then given a paralytic to complete the intubation.   There was some trauma due to attempt to replace trach. Has been strugglling with bloody mucus plugs during admission likely due to trauma with attempts to change trach. Pt has been on daytime trach collar night time vent since 12/19.    Assessment / Plan / Recommendation Clinical Impression  Pt demonstrates excellent PMSV tolerance. Vocal quality is breathy 80% of the time, but with extra foce pt has a dysphonic but intelligble voice that does often break. She reports this is similar to her baseline. She is familiar with PMSV/cap and demonstrates placement and removal independently. SLP provided verbal and written reminders of precautions to pt, mother and RN (most importantly checking for cuff deflation prior to placement given ongoing vent use). Pt able to wear PMSV independently after staff initial placement. Encouraged use during all waking hours and with PO intake.  SLP Visit Diagnosis: Aphonia (R49.1)    SLP Assessment  Patient needs continued Speech Lanaguage Pathology Services    Follow Up Recommendations  24 hour supervision/assistance    Frequency and Duration min 2x/week  2 weeks    PMSV Trial PMSV was placed for: 20 minutes Able to redirect subglottic air through upper airway: Yes Able to Attain Phonation: Yes Voice Quality: Hoarse Able to Expectorate Secretions: Yes Level of Secretion Expectoration with PMSV: Oral Breath Support for Phonation: Adequate Intelligibility: Intelligibility reduced Phrase: 75-100% accurate Sentence: 75-100% accurate Conversation:  75-100% accurate Respirations During Trial: 15 SpO2 During Trial: 98 % Behavior: Alert;Cooperative   Tracheostomy Tube  Additional Tracheostomy Tube Assessment Trach Collar Period: since this am Secretion Description: bloody/thin Frequency of Tracheal Suctioning: rare Level of Secretion Expectoration: Tracheal    Vent Dependency  Vent Dependent: Yes FiO2 (%): 40 % Nocturnal Vent: Yes    Cuff Deflation Trial  GO Tolerated Cuff Deflation: Yes Length of Time for Cuff Deflation Trial: 1 minute Behavior: Alert;Controlled;Cooperative;Expresses self well         Herbie Baltimore, MA CCC-SLP  Acute Rehabilitation Services Pager [phone] Office [phone]  Lynann Beaver 08/12/2019, 12:01 PM

## 2019-08-13 LAB — CBC
HCT: 44.9 % (ref 36.0–46.0)
Hemoglobin: 13.2 g/dL (ref 12.0–15.0)
MCH: 27.4 pg (ref 26.0–34.0)
MCHC: 29.4 g/dL — ABNORMAL LOW (ref 30.0–36.0)
MCV: 93.2 fL (ref 80.0–100.0)
Platelets: 157 10*3/uL (ref 150–400)
RBC: 4.82 MIL/uL (ref 3.87–5.11)
RDW: 17.4 % — ABNORMAL HIGH (ref 11.5–15.5)
WBC: 4.7 10*3/uL (ref 4.0–10.5)
nRBC: 0 % (ref 0.0–0.2)

## 2019-08-13 LAB — GLUCOSE, CAPILLARY
Glucose-Capillary: 100 mg/dL — ABNORMAL HIGH (ref 70–99)
Glucose-Capillary: 101 mg/dL — ABNORMAL HIGH (ref 70–99)
Glucose-Capillary: 112 mg/dL — ABNORMAL HIGH (ref 70–99)
Glucose-Capillary: 116 mg/dL — ABNORMAL HIGH (ref 70–99)
Glucose-Capillary: 97 mg/dL (ref 70–99)
Glucose-Capillary: 98 mg/dL (ref 70–99)

## 2019-08-13 MED ORDER — IPRATROPIUM-ALBUTEROL 0.5-2.5 (3) MG/3ML IN SOLN: 3.0000 mL | Freq: Four times a day (QID) | RESPIRATORY_TRACT | Status: DC | PRN

## 2019-08-13 MED ORDER — FUROSEMIDE 10 MG/ML IJ SOLN
40.0000 mg | Freq: Two times a day (BID) | INTRAMUSCULAR | Status: DC
  Administered 2019-08-13 – 2019-08-14 (×3): 40 mg via INTRAVENOUS
  Filled 2019-08-13 (×3): qty 4

## 2019-08-13 NOTE — Progress Notes (Signed)
Physical Therapy Treatment Patient Details Name: [name] [name] MRN: [id number] DOB: 1981-01-12 Today's Date: 08/13/2019    History of Present Illness 38 year old woman with a history of HFpEF, obesity, OSA treated with trach, DM2, CKD, HTN, pulmonary HTN ,  in ED after found to be hypoxemic. Pt underwent percutaneous trach placement on 12/15.    PT Comments    Pt tolerated treatment well, demonstrating significant increases in activity tolerance and ambulation distance. Pt with desaturation to 88% as noted below, however ambulating further than she typically does at baseline. Pt also participating in toileting and multiple transfers prior to ambulation. Pt will continue to benefit form acute PT POC to improve activity tolerance and restore independence.   Follow Up Recommendations  Home health PT     Equipment Recommendations  None recommended by PT    Recommendations for Other Services       Precautions / Restrictions Precautions Precautions: Fall Restrictions Weight Bearing Restrictions: No    Mobility  Bed Mobility Overal bed mobility: (pt received and left in recliner)                Transfers Overall transfer level: Independent Equipment used: None   Sit to Stand: Independent            Ambulation/Gait Ambulation/Gait assistance: Supervision Gait Distance (Feet): 200 Feet Assistive device: None Gait Pattern/deviations: Step-to pattern;Wide base of support Gait velocity: reduced Gait velocity interpretation: 1.31 - 2.62 ft/sec, indicative of limited community ambulator General Gait Details: pt with shortened step to gait with widened BOS, no remarkable balance deviations   Stairs             Wheelchair Mobility    Modified Rankin (Stroke Patients Only)       Balance Overall balance assessment: Needs assistance Sitting-balance support: No upper extremity supported;Feet supported Sitting balance-Leahy Scale: Normal     Standing  balance support: No upper extremity supported Standing balance-Leahy Scale: Good Standing balance comment: supervision                            Cognition Arousal/Alertness: Awake/alert Behavior During Therapy: WFL for tasks assessed/performed Overall Cognitive Status: Within Functional Limits for tasks assessed                                        Exercises      General Comments General comments (skin integrity, edema, etc.): Pt on 10L via trach collar, desaturating to 88% roughly 120' into ambulation with RR elevated to 45. With standing rest break pt's RR recovers to 26, SpO2 recovering to 91%. Once seated and resting pt SpO2 recovers to 94%. Of note pt ambulating further this session than normal at baseline per pt report      Pertinent Vitals/Pain Pain Assessment: No/denies pain    Home Living                      Prior Function            PT Goals (current goals can now be found in the care plan section) Acute Rehab PT Goals Patient Stated Goal: To improve mobility and return to prior level of function Progress towards PT goals: Progressing toward goals    Frequency    Min 3X/week      PT Plan Current plan remains  appropriate    Co-evaluation              AM-PAC PT "6 Clicks" Mobility   Outcome Measure  Help needed turning from your back to your side while in a flat bed without using bedrails?: None Help needed moving from lying on your back to sitting on the side of a flat bed without using bedrails?: A Little Help needed moving to and from a bed to a chair (including a wheelchair)?: None Help needed standing up from a chair using your arms (e.g., wheelchair or bedside chair)?: None Help needed to walk in hospital room?: None Help needed climbing 3-5 steps with a railing? : A Lot 6 Click Score: 21    End of Session Equipment Utilized During Treatment: Oxygen Activity Tolerance: Patient tolerated treatment  well Patient left: in chair;with call bell/phone within reach Nurse Communication: Mobility status PT Visit Diagnosis: Unsteadiness on feet (R26.81);Muscle weakness (generalized) (M62.81)     Time: 0488-8916 PT Time Calculation (min) (ACUTE ONLY): 38 min  Charges:  $Gait Training: 8-22 mins $Therapeutic Activity: 23-37 mins                     Zenaida Niece, PT, DPT Acute Rehabilitation Pager: [phone]    Zenaida Niece 08/13/2019, 9:44 AM

## 2019-08-13 NOTE — Progress Notes (Signed)
Nutrition Follow-up  DOCUMENTATION CODES:   Morbid obesity  INTERVENTION:   Follow for need for po supplements   NUTRITION DIAGNOSIS:   Inadequate oral intake related to acute illness as evidenced by NPO status.  Being addressed as diet advanced post extubation  GOAL:   Patient will meet greater than or equal to 90% of their needs  Progressing  MONITOR:   Vent status, Labs, TF tolerance, Weight trends, Skin, Diet advancement  REASON FOR ASSESSMENT:   Consult, Ventilator Enteral/tube feeding initiation and management  ASSESSMENT:   38 yo female admitted with acute on chronic respiratory failure with b/l infiltrates requiring vent support via trach; PMH includes HTN, CHF, CKD II, DM, OSA, obesity   Off vent support, tolerating trach collar x >24 hours  NG tube fell out yesterday while pt using the restroom yesterday; diet advanced to [place] yesterday as well  TF no longer infusing  Labs: CBGs 97-135 Meds: lasix   Diet Order:   Diet Order            Diet heart healthy/carb modified Room service appropriate? Yes; Fluid consistency: Thin  Diet effective now              EDUCATION NEEDS:   Not appropriate for education at this time  Skin:  Skin Assessment: Skin Integrity Issues: Skin Integrity Issues:: Other (Comment) Other: MASD-buttocks  Last BM:  12/17  Height:   Ht Readings from Last 1 Encounters:  08/06/19 5' 5" (1.651 m)    Weight:   Wt Readings from Last 1 Encounters:  08/13/19 (!) 181.8 kg    Ideal Body Weight:  56.8 kg  BMI:  Body mass index is 66.7 kg/m.  Estimated Nutritional Needs:   Kcal:  2000-2200 kcals  Protein:  115-130 g  Fluid:  >/= 1.8 L   Cate Sherran Margolis MS, RDN, LDN, CNSC [phone] Pager  [phone] Weekend/On-Call Pager

## 2019-08-13 NOTE — Progress Notes (Signed)
NAME:  [name] [name], MRN:  [id number], DOB:  11-19-1980, LOS: 6 ADMISSION DATE:  08/06/2019, CONSULTATION DATE:  08/07/19 REFERRING MD:  ED, CHIEF COMPLAINT:  Shortness of breath, hypoxemia  Brief History   [name] [name] is a 38 year old woman with a history of HFpEF, obesity, OSA/OHS treated with trach after critical illness 2017, DM2, CKD, HTN, pulmonary HTN ,   Admitted with acute hypercarbic respiratory failure after attempted trach downsize    In anticipation of trach change, she was undergoing covid testing, while there she was noted to have an O2 sat of 60%, and she was sent to the ER.  She noted that she had some shortness of breath and decreased energy over the past several weeks.  Denied cough, congestions, fever.   Plan had been for gradual downsizing of trach with goal of removal.     Initially also found to be hypercarbic in the ED.  7.315/61.6/27   Follow up ABG showed progressively worsening hypercarbia 7.1/>97/111 (per RT note, I do not see this in the results)  AMS, dropping O2 sat. ED attempted to change cuffless 5-0 trach to 4-0 cuffed shiley, unable to pass.  Her original trach was replaced,  And she was bagged through this per note.  She was then intubated from above with a 6-0 tube due to"stenotic and  small vocal cords". Initially only given 82m ketamine, then given a paralytic to complete the intubation.   There was some trauma due to attempt to replace trach.   Per ED note, concern from radiologist re R sided PE, heparin was started.    Past Medical History  HFpEF, obesity, OSA treated with trach, DM2, CKD, HTN, pulmonary HTN 2/2 OSA/CHF  Home meds listed: amlodipine, coreg, isordil, lisinopril, hydralazine, lasix,nystatin, flonase  Echo 2018:  EF 55%-60% Not  technically sufficient to allow evaluation of LV diastolic function. - Right ventricle: The cavity size was normal. Wall thickness was   normal. Systolic function was normal. - Pulmonary arteries:  Systolic pressure was within the normal   range. PA peak pressure: 15 mm Hg (S).  Diagnosed with pHTN in 2017, intubated for resp failrue, HTN, pulm edema, OHS/OSA.   Significant Hospital Events   12/14 admitted intubated 12/15 trach placed   Consults:    Procedures:  12/14 ETT >> 12/15 12/15 tracheostomy revised >>  Significant Diagnostic Tests:  12/14 CTA: 1. Nearly nondiagnostic study as detailed above. 2. No definite large centrally located pulmonary embolism. 3. Probable filling defect within the lobar branches on the right and segmental branches of the bilateral lower lobes. Findings could represent acute pulmonary emboli in the appropriate clinical setting. This is not well evaluated secondary to the limitations described above. 4. The RV/LV ratio measures 1.3 raising concern for right-sided heart strain. 5. Significantly dilated main pulmonary artery which can be seen in patients with pulmonary artery hypertension. 6. Streaky linear bilateral airspace opacities favored to represent atelectasis with developing infiltrates not excluded. 7. Low lung volumes. The tracheostomy tube terminates above the carina.  12/15 VQ scan negative 12/15 duplex-suboptimal negative 12/15 echo-normal LV function, RV function normal   Micro Data:  12/15 tracheal aspirate >> neg 12/15 - MRSA PCR - neg 12/14 - covid -pcr - neg 12/14- flu pcr - neg       Antimicrobials:  12/14 Cefepime 12/14 Vanc  12/15 ceftx >>12/18 12/15 doxy >>1219  .    Interim history/subjective:   12/21: speech saw yesterday with PMV and able to  take PO now. tmax 98.5 remained off vent overnight. Will monitor today and if tolerates off vent again tonight, will d/c tomorrow.  12/19 - off all abx. Culture negative. This AM on ATC 40% wih 10L. Mucus plug removed. Hass PMV today for first time. Cuff is deflated. She is sitting and talking. No distress. Reports feeling well  Objective   Blood pressure  112/78, pulse 90, temperature 98.2 F (36.8 C), temperature source Oral, resp. rate 19, height _0  (1.651 m), weight (!) 181.8 kg, SpO2 100 %, unknown if currently breastfeeding.    Vent Mode: Stand-by FiO2 (%):  [40 %-60 %] 40 %   Intake/Output Summary (Last 24 hours) at 08/13/2019 0847 Last data filed at 08/13/2019 0600 Gross per 24 hour  Intake 424.31 ml  Output 1001 ml  Net -576.69 ml   Filed Weights   08/10/19 0600 08/11/19 0500 08/13/19 0500  Weight: (!) 170 kg (!) 179.8 kg (!) 181.8 kg    General Appearance:  Looks well. Morbid obese. Standing up with PT Head:  Normocephalic, without obvious abnormality, atraumatic Eyes:  PERRL - yes, conjunctiva/corneas - muddy     Ears:  Normal external ear canals, both ears Nose:  G tube - TF +.  Throat:TRACH + Neck:  Supple,  No enlargement/tenderness/nodules Lungs: Clear to auscultation bilaterally, diminished in bases Heart:  S1 and S2 normal, no murmur, CVP - no.  Pressors - no Abdomen:  Soft, no masses, no organomegaly. OBESE with PANNUS + Genitalia / Rectal:  Not done Extremities:  Extremities- intact Skin:  ntact in exposed areas . Sacral area - not examined Neurologic:  Up moving around with PT. conversant       Resolved Hospital Problem list     Assessment & Plan:  38 year old woman with hx of HFpEF, obesity, OSA treated with trach, DM2, CKD, HTN, pulmonary HTN, admitted with hypoxic/hypercarbic respiratory failure likely due to blocked tracheostomy which was being downsized vs bilateral infiltrates?  Edema/atypical pneumonia  1.   #Baseline   Chronic hypercarbic respiratory failure - she was sleeping with CPAP and trach capped -Unfortunately has failed trach decannulation/downsizing attempt OSA/OHS -tolerating ATC x24 hours just now.  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  Will provide additional dose today   CKD stage II Plan  - monitor    6.Electrolyte imbalance hypomag Plan  -replaced        Best practice:  Diet: NPO for now Pain/Anxiety/Delirium protocol (if indicated):  dilaudid prn VAP protocol (if indicated): yes DVT prophylaxis: heparin SQ GI prophylaxis: ppi Glucose control: monitor glucose, ISS if needed Mobility: bed Code Status: full  Family Communication: Patient Disposition: likely d/c tomorrow if remains on ATC thru evening again today.       LABS    PULMONARY Recent Labs  Lab 08/06/19 1828 08/07/19 0258 08/07/19 0617  PHART 7.315* 7.193* 7.286*  PCO2ART 61.6* 82.2* 71.6*  PO2ART 27.0* 84.0 195.0*  HCO3 31.5* 31.6* 34.3*  TCO2 33* 34* 36*  O2SAT 45.0 92.0 100.0    CBC Recent Labs  Lab 08/11/19 0357 08/12/19 0254 08/13/19 0222  HGB 13.5 13.4 13.2  HCT 44.6 45.4  44.9  WBC 5.3 5.1 4.7  PLT 150 165 157    COAGULATION No results for input(s): INR in the last 168 hours.  CARDIAC  No results for input(s): TROPONINI in the last 168 hours. No results for input(s): PROBNP in the last 168 hours.   CHEMISTRY Recent Labs  Lab 08/07/19 0456 08/07/19 0617 08/08/19 0303 08/09/19 0224 08/10/19 0503 08/11/19 0357  NA 144 143 141 142 139 138  K 4.8 3.9 3.4* 3.2* 3.1* 4.2  CL 104  --  100 97* 95* 97*  CO2 27  --  _0 GLUCOSE 90  --  81 79 91 112*  BUN 22*  --  _1 CREATININE 1.48*  --  1.29* 1.21* 1.13* 0.89  CALCIUM 8.4*  --  8.4* 8.3* 8.5* 8.5*  MG 2.3  --  1.8 1.7 1.9  --   PHOS 4.6  --  2.0* 3.7  --   --    Estimated Creatinine Clearance: 141.8 mL/min (by C-G formula based on SCr of 0.89 mg/dL).   LIVER Recent Labs  Lab 08/06/19 1833   AST 17  ALT 17  ALKPHOS 77  BILITOT 0.6  PROT 7.7  ALBUMIN 3.2*     INFECTIOUS Recent Labs  Lab 08/06/19 1826 08/07/19 0456 08/08/19 0303 08/09/19 0224  LATICACIDVEN 1.4  --   --   --   PROCALCITON  --  0.22 0.16 0.24     ENDOCRINE CBG (last 3)  Recent Labs    08/13/19 0012 08/13/19 0350 08/13/19 0754  GLUCAP 100* 116* 97         IMAGING x48h  - image(s) personally visualized  -   highlighted in bold DG Abd 1 View  Result Date: 08/12/2019 CLINICAL DATA:  NG tube placement. EXAM: ABDOMEN - 1 VIEW COMPARISON:  08/09/2019 FINDINGS: An NG tube is noted with tip overlying the proximal stomach-recommend advancement. No other significant changes identified. IMPRESSION: NG tube with tip overlying the proximal stomach-recommend advancement. Electronically Signed   By: Margarette Canada M.D.   On: 08/12/2019 11:05   Critical care time: 73428 , 37 mins managing pt's care    Audria Nine DO [facility] 08/13/2019, 8:48 AM

## 2019-08-13 NOTE — Progress Notes (Signed)
Per Critical care note, pt has not needed night trach vent support for 24hrs. Clarified with 2[facility] RN, Sam, there are no plans for vent at night as [facility] does not do trach vents. Sam stated she has tolerated being off the nighttime vent for 24hrs and does not do vent at home so she should not require this tonight. Rey, primary RN, at bedside and updated.

## 2019-08-13 NOTE — Progress Notes (Signed)
  Speech Language Pathology Treatment: [name] [name] Speaking valve;Dysphagia  Patient Details Name: [name] [name] MRN: [id number] DOB: August 07, 1981 Today's Date: 08/13/2019 Time: 1112-1120 SLP Time Calculation (min) (ACUTE ONLY): 8 min  Assessment / Plan / Recommendation Clinical Impression  [name] [name] with PMV donned when therapist arrived speaking in conversation. She was 100% intelligible with vocal quality hoarse. RR 18-28, HR 93 and SpO2 100%.Independently donned and doffed valve, had no questions re: using at home as pt has a chronic trach and had PMV when went home prior until went to red plug. Will likely have trach changed to cuffless prior to home (if doesn't need nocturnal ventilation).   Observed consumption of regular solid and thin liquids - no s/s aspiration. POo intake unremarkable. ST will sign off at this time for swallow and PMV.    HPI HPI: [name] [name] is a 38 year old woman with a history of HFpEF, obesity, OSA/OHS treated with trach after critical illness 2017, DM2, CKD, HTN, pulmonary HTN. Had FEES ain 2017 found to have persistent posterior glottic chink, no aspiration. Consumes regular diet and thin liquids at baseline. Wore a red cap during waking hours and removed for sleep due to sleep apnea. Admitted with acute hypercarbic respiratory failure after attempted trach downsize.  In anticipation of trach change, she was undergoing covid testing, while there she was noted to have an O2 sat of 60%, and she was sent to the ER. ED attempted to change cuffless 5-0 trach to 4-0 cuffed shiley, unable to pass.  Her original trach was replaced,  And she was bagged through this per note.  She was then intubated from above with a 6-0 tube due to"stenotic and  small vocal cords". Initially only given 38m ketamine, then given a paralytic to complete the intubation.   There was some trauma due to attempt to replace trach. Has been strugglling with bloody mucus plugs during admission likely due to  trauma with attempts to change trach. Pt has been on daytime trach collar night time vent since 12/19.       SLP Plan  All goals met       Recommendations  Diet recommendations: Regular;Thin liquid Liquids provided via: Straw Medication Administration: Whole meds with liquid Supervision: Patient able to self feed Compensations: Slow rate;Small sips/bites;Other (Comment)(valve donned when eating) Postural Changes and/or Swallow Maneuvers: Seated upright 90 degrees      Patient may use Passy-Muir Speech Valve: During all waking hours (remove during sleep) PMSV Supervision: Intermittent         Oral Care Recommendations: Oral care BID SLP Visit Diagnosis: Dysphagia, unspecified (R13.10);Aphonia (R49.1) Plan: All goals met                       LHouston Siren12/21/2020, 11:33 AM   LOrbie PyoLColvin CaroliEd CRisk [phone] [phone]

## 2019-08-14 ENCOUNTER — Encounter (HOSPITAL_COMMUNITY): Payer: Self-pay | Admitting: Pulmonary Disease

## 2019-08-14 LAB — CBC
HCT: 46.9 % — ABNORMAL HIGH (ref 36.0–46.0)
Hemoglobin: 13.4 g/dL (ref 12.0–15.0)
MCH: 27 pg (ref 26.0–34.0)
MCHC: 28.6 g/dL — ABNORMAL LOW (ref 30.0–36.0)
MCV: 94.6 fL (ref 80.0–100.0)
Platelets: 194 10*3/uL (ref 150–400)
RBC: 4.96 MIL/uL (ref 3.87–5.11)
RDW: 17.3 % — ABNORMAL HIGH (ref 11.5–15.5)
WBC: 3.9 10*3/uL — ABNORMAL LOW (ref 4.0–10.5)
nRBC: 0 % (ref 0.0–0.2)

## 2019-08-14 LAB — BASIC METABOLIC PANEL
Anion gap: 11 (ref 5–15)
BUN: 19 mg/dL (ref 6–20)
CO2: 36 mmol/L — ABNORMAL HIGH (ref 22–32)
Calcium: 9.4 mg/dL (ref 8.9–10.3)
Chloride: 92 mmol/L — ABNORMAL LOW (ref 98–111)
Creatinine, Ser: 1.01 mg/dL — ABNORMAL HIGH (ref 0.44–1.00)
GFR calc Af Amer: >60 mL/min (ref >60)
GFR calc non Af Amer: >60 mL/min (ref >60)
Glucose, Bld: 99 mg/dL (ref 70–99)
Potassium: 4.1 mmol/L (ref 3.5–5.1)
Sodium: 139 mmol/L (ref 135–145)

## 2019-08-14 LAB — GLUCOSE, CAPILLARY
Glucose-Capillary: 94 mg/dL (ref 70–99)
Glucose-Capillary: 89 mg/dL (ref 70–99)

## 2019-08-14 NOTE — Discharge Summary (Addendum)
Physician Discharge Summary  Patient ID: SHANNIN NAAB MRN: [id number] DOB/AGE: 26-Apr-1981 38 y.o.  Admit date: 08/06/2019 Discharge date: 08/14/2019  Problem List Active Problems:   Acute pulmonary embolism ([place])   Hypoxemia  Brief History   Ms. Gaumond is a 38 year old woman with a history of HFpEF, obesity, OSA/OHS treated with trach after critical illness 2017, DM2, CKD, HTN, pulmonary HTN ,   Admitted with acute hypercarbic respiratory failure after attempted trach downsize  In anticipation of trach change, she was undergoing covid testing, while there she was noted to have an O2 sat of 60%, and she was sent to the ER. She noted that she had some shortness of breath and decreased energy over the past several weeks. Denied cough, congestions, fever.   Plan had been for gradual downsizing of trach with goal of removal.   Initially also found to be hypercarbic in the ED. 7.315/61.6/27   Follow up ABG showed progressively worsening hypercarbia 7.1/>97/111 (per RT note, I do not see this in the results)  AMS, dropping O2 sat. ED attempted to change cuffless 5-0 trach to 4-0 cuffed shiley, unable to pass.  Her original trach was replaced,  And she was bagged through this per note.  She was then intubated from above with a 6-0 tube due to"stenotic and  small vocal cords". Initially only given 77m ketamine, then given a paralytic to complete the intubation.   There was some trauma due to attempt to replace trach.   Per ED note, concern from radiologist re R sided PE, heparin was started.     Hospital Course: Per note 12/15: Patient admitted overnight after loosing her tracheostomy.  They were unable to reinsert tracheostomy and patient was intubated from above due to acute hypoxemic and hypercarbic respiratory failure.  This morning, the patient is awake and interactive on exam with coarse BS diffusely.  I reviewed CXR myself, ETT is in a good position and no acute  disease noted.  Patient is currently on heparin for r/o PE.  Will continue rocephin and doxy for now.  Hold heparin for re-trach and doubt very much this a PE, will order a V/Q scan given renal function.  Maintain fluid even.  Hope is to retrach and get of the vent today, order a swallow evaluation for tomorrow and attempt to normalize back to baseline.  2D echo and lower ext dopplers also ordered as I doubt the accuracy of the VQ scan given body habitus.   A new trach was placed 12/15 by Dr YNelda Marseille #6 shiley. She was maintained on vent and weaned as tolerated.   Over the course of her hospitalization PE was r/o with VQ scan/ LE dopplers negative and echo without acute changes and heparin was stopped. She was weaned from ventilator and on ATC 40% with sats 98-100%. She will be d/c'd with continuous oxygen to wean at home as pt favors leaving the hospital. She has remained off the ventilator for >48 hours. She was advised to return to the hospital for any change in symptoms or oxygen saturations.   She will have f/u with PMarni Griffonin the trach clinic. She was advised to f/u with her PCP within 1 wk of hospital discharge  Discharge physical exam:  Blood pressure 97/60, pulse 85, temperature 98.4 F (36.9 C), temperature source Oral, resp. rate 20, height _0  (1.651 m), weight (!) 179.7 kg, SpO2 96 %, unknown if currently breastfeeding. General Appearance:  Looks well. Morbid obese.sitting up  oob in chair Head:  Normocephalic, without obvious abnormality, atraumatic Eyes:  PERRL - yes, conjunctiva/corneas - clear Nose: clear Throat:TRACH + with pmv I nplace Neck:  Supple,  No enlargement/tenderness/nodules Lungs: Clear to auscultation bilaterally, diminished in bases Heart:  S1 and S2 normal, no murmur, CVP - no.  Pressors - no Abdomen:  Soft, no masses, no organomegaly. OBESE with PANNUS + Genitalia / Rectal:  Not done Extremities:  Extremities- no c/c +edema Skin:  -ntact in exposed areas .  Sacral area - not examined Neurologic:  up oob in chiar without focal deficits  Labs at discharge Lab Results  Component Value Date   CREATININE 1.01 (H) 08/14/2019   BUN 19 08/14/2019   NA 139 08/14/2019   K 4.1 08/14/2019   CL 92 (L) 08/14/2019   CO2 36 (H) 08/14/2019   Lab Results  Component Value Date   WBC 3.9 (L) 08/14/2019   HGB 13.4 08/14/2019   HCT 46.9 (H) 08/14/2019   MCV 94.6 08/14/2019   PLT 194 08/14/2019   Lab Results  Component Value Date   ALT 17 08/06/2019   AST 17 08/06/2019   ALKPHOS 77 08/06/2019   BILITOT 0.6 08/06/2019   Lab Results  Component Value Date   INR 1.27 09/18/2016   INR 1.28 05/26/2016    Current radiology studies No results found.  Disposition:  Discharge disposition: 01-Home or Self Care       Discharge Instructions    Call MD for:  difficulty breathing, headache or visual disturbances   Complete by: As directed    Call MD for:  extreme fatigue   Complete by: As directed    Call MD for:  temperature >100.4   Complete by: As directed    Diet - low sodium heart healthy   Complete by: As directed    Increase activity slowly   Complete by: As directed      Allergies as of 08/14/2019      Reactions   No Known Allergies       Medication List    TAKE these medications   amLODipine 10 MG tablet Commonly known as: NORVASC Take 1 tablet (10 mg total) by mouth daily.   carvedilol 25 MG tablet Commonly known as: COREG Take 1 tablet (25 mg total) by mouth 2 (two) times daily.   fluticasone 50 MCG/ACT nasal spray Commonly known as: Flonase Place 2 sprays into both nostrils 2 (two) times daily.   furosemide 40 MG tablet Commonly known as: LASIX Take 1 tablet (40 mg total) by mouth daily.   hydrALAZINE 100 MG tablet Commonly known as: APRESOLINE TAKE 1 TABLET BY MOUTH THREE TIMES A DAY   isosorbide dinitrate 30 MG tablet Commonly known as: ISORDIL Take 1 tablet (30 mg total) by mouth 3 (three) times daily.    lisinopril 40 MG tablet Commonly known as: ZESTRIL Take 1 tablet (40 mg total) by mouth daily.            Durable Medical Equipment  (From admission, onward)         Start     Ordered   08/14/19 1056  DME Oxygen  Once    Comments: Via ATC 40%  Question Answer Comment  Length of Need Lifetime   Mode or (Route) Mask   Liters per Minute 6   Frequency Continuous (stationary and portable oxygen unit needed)   Oxygen delivery system Gas      08/14/19 1056  Follow-up Information    [facility].   Specialty: Home Health Services Why: Physial Therapy-office to call with visit times. If you need to contact the office please call [phone] Contact information: [place] [address] [phone]        Erick Colace, NP. Call.   Specialties: Nurse Practitioner, Acute Care, Pulmonary Disease Why: Mr Kary Kos or Valetta Fuller from [facility] will call to make appt for trach clinic.  Contact information: [address] [address] [phone]        Nche, Charlene Brooke, NP. Schedule an appointment as soon as possible for a visit in 1 week(s).   Specialty: Internal Medicine Why: for hospital f/u Contact information: [address] [address] [phone]            Discharged Condition: fair  Time spent on discharge 41 minutes.  Vital signs at Discharge. Temp:  [97.5 F (36.4 C)-98.4 F (36.9 C)] 98.4 F (36.9 C) (12/22 0415) Pulse Rate:  [81-90] 85 (12/22 0742) Resp:  [14-22] 20 (12/22 0742) BP: (97-143)/(60-89) 97/60 (12/22 0415) SpO2:  [90 %-100 %] 96 % (12/22 0742) FiO2 (%):  [40 %] 40 % (12/22 0742) Weight:  [179.7 kg] 179.7 kg (12/22 0500) Office follow up Special Information or instructions:  -will need wean from oxygen as tolerated based on saturations over time.  -f/u with trach clinic -f/u with pcp  Signed: Audria Nine DO [facility] 08/14/2019, 11:04 AM

## 2019-08-14 NOTE — TOC Transition Note (Signed)
Transition of Care [facility]) - CM/SW Discharge Note   Patient Details  Name: [name] [name] MRN: [id number] Date of Birth: 1981-08-06  [facility] CM/SW Contact:  Bethena Roys, RN Phone Number: 08/14/2019, 11:37 AM   Clinical Narrative:  Plan will be to transition home today with home health services [facility] aware that patient will transition today. Patient will need increased oxygen. Patient is active with [employer]. [employer] Liaison made aware and will deliver portable tanks to the room prior to transition home. New portable oxygen concentrator to be delivered to the home. Staff Registered Nurse aware to provide trach education to the patient and supplies until oxygen supply company can deliver. Patient stated she was only using oxygen at night prior to hospitalization. Patient has transportation home via mother. No further needs from Case Manager at this time.    Final next level of care: [place] Barriers to Discharge: No Barriers Identified   Patient Goals and CMS Choice Patient states their goals for this hospitalization and ongoing recovery are:: "to return home" CMS Medicare.gov Compare Post Acute Care list provided to:: Patient Choice offered to / list presented to : Patient   Discharge Plan and Services In-house Referral: NA Discharge Planning Services: CM Consult Post Acute Care Choice: Home Health          DME Arranged: Oxygen DME Agency: [employer] Date DME Agency Contacted: 08/14/19 Time DME Agency Contacted: 1127 Representative spoke with at DME Agency: [place]: PT [place]: [facility] [employer]) Date [place]: 08/09/19 Time [place]: [phone] Representative spoke with at [place]: [place]   Readmission Risk Interventions No flowsheet data found.

## 2019-08-14 NOTE — Progress Notes (Signed)
Called by RN to provide pt with home going trach care teaching. Upon arrival pt has a #6 Cuffed Shiley trach. She currently has a PMV on. Per pt she has had the trach since 2017 due to severe OSA. Reviewed trach care, cleaning and supplies with pt. Reviewed with pt when to call 911 in case of emergency. Reviewed to call physician if secretions develop a foul odor or change colors. Pt instructed on how to reinsert trach if need be. States that her mother is at the home to help her. Pt given home going trach care booklet. Verbalizes understanding of the above and no further questions at this time. She does follow up at the outpatient trach clinic. RT encouraged pt to look over booklet and if she has any other questions prior to discharge the RT would come back to answer them. Pt verbalizes understanding and states she is very comfortable with her trach and its care as this is routine for her.

## 2019-08-14 NOTE — Progress Notes (Signed)
Physical Therapy Treatment Patient Details Name: KIYANNA BIEGLER MRN: [id number] DOB: 02/17/1981 Today's Date: 08/14/2019    History of Present Illness 38 year old woman with a history of HFpEF, obesity, OSA treated with trach, DM2, CKD, HTN, pulmonary HTN ,  in ED after found to be hypoxemic. Pt underwent percutaneous trach placement on 12/15.    PT Comments    Patient received in recliner. Independent with transfers, ambulated 200 feet, cues needed for O2 sats and to rest. Patient steady and ambulates without ad, supervision. She will continue to benefit from skilled PT while here to improve activity tolerance and strength.        Follow Up Recommendations  Home health PT     Equipment Recommendations  None recommended by PT    Recommendations for Other Services       Precautions / Restrictions Precautions Precautions: Fall Restrictions Weight Bearing Restrictions: No    Mobility  Bed Mobility               General bed mobility comments: patient received sitting up in recliner  Transfers Overall transfer level: Independent Equipment used: None Transfers: Sit to/from Stand Sit to Stand: Independent            Ambulation/Gait Ambulation/Gait assistance: Supervision Gait Distance (Feet): 200 Feet Assistive device: None Gait Pattern/deviations: Step-to pattern;Wide base of support Gait velocity: reduced   General Gait Details: pt with shortened step to gait with widened BOS, no remarkable balance deviations, O2 sats drop to low 80%s when ambulating. With standing rest returned to > 90%   Stairs             Wheelchair Mobility    Modified Rankin (Stroke Patients Only)       Balance Overall balance assessment: Independent Sitting-balance support: Feet supported Sitting balance-Leahy Scale: Normal     Standing balance support: No upper extremity supported;During functional activity Standing balance-Leahy Scale: Good Standing balance  comment: supervision                            Cognition Arousal/Alertness: Awake/alert Behavior During Therapy: WFL for tasks assessed/performed Overall Cognitive Status: Within Functional Limits for tasks assessed                                        Exercises      General Comments        Pertinent Vitals/Pain      Home Living                      Prior Function            PT Goals (current goals can now be found in the care plan section) Acute Rehab PT Goals Patient Stated Goal: To improve mobility and return to prior level of function PT Goal Formulation: With patient Time For Goal Achievement: 08/22/19 Potential to Achieve Goals: Good Progress towards PT goals: Progressing toward goals    Frequency    Min 3X/week      PT Plan Current plan remains appropriate    Co-evaluation              AM-PAC PT "6 Clicks" Mobility   Outcome Measure  Help needed turning from your back to your side while in a flat bed without using bedrails?: None Help needed moving from  lying on your back to sitting on the side of a flat bed without using bedrails?: None Help needed moving to and from a bed to a chair (including a wheelchair)?: None Help needed standing up from a chair using your arms (e.g., wheelchair or bedside chair)?: None Help needed to walk in hospital room?: None Help needed climbing 3-5 steps with a railing? : A Little 6 Click Score: 23    End of Session Equipment Utilized During Treatment: Oxygen Activity Tolerance: Patient tolerated treatment well Patient left: in chair;with call bell/phone within reach Nurse Communication: Mobility status PT Visit Diagnosis: Muscle weakness (generalized) (M62.81)     Time: 5521-7471 PT Time Calculation (min) (ACUTE ONLY): 26 min  Charges:  $Gait Training: 23-37 mins                     Madelene Kaatz, PT, GCS 08/14/19,4:12 PM

## 2019-08-14 NOTE — Care Management Important Message (Signed)
Important Message  Patient Details  Name: [name] [name] MRN: [id number] Date of Birth: [birth year]   Medicare Important Message Given:  Yes     Shelda Altes 08/14/2019, 3:41 PM

## 2019-08-15 ENCOUNTER — Telehealth: Payer: Self-pay | Admitting: Behavioral Health

## 2019-08-15 NOTE — Telephone Encounter (Signed)
Transition Care Management Follow-up Telephone Call  Admit date: 08/06/2019 Discharge date: 08/14/2019   How have you been since you were released from the hospital? Patient stated, "everything is going okay".   Do you understand why you were in the hospital? yes   Do you understand the discharge instructions? yes   Where were you discharged to? Home   Items Reviewed:  Medications reviewed: yes  Allergies reviewed: yes  Dietary changes reviewed: yes, low-sodium, heart healthy diet.  Referrals reviewed: yes, follow-up with PCP and Pulmonology.   Functional Questionnaire:   Activities of Daily Living (ADLs):   She states they are independent in the following: ambulation, bathing and hygiene, feeding, continence, grooming, toileting and dressing States they require assistance with the following: None   Any transportation issues/concerns?: no   Any patient concerns? no, pt. stated, "none at this time".   Confirmed importance and date/time of follow-up visits scheduled yes, 08/21/19 (virtual appointment).  Provider Appointment booked with Wilfred Lacy, NP.  Confirmed with patient if condition begins to worsen call PCP or go to the ER.  Patient was given the office number and encouraged to call back with question or concerns.  : yes

## 2019-08-21 ENCOUNTER — Encounter: Payer: Self-pay | Admitting: Nurse Practitioner

## 2019-08-21 ENCOUNTER — Telehealth (INDEPENDENT_AMBULATORY_CARE_PROVIDER_SITE_OTHER): Payer: Medicare Other | Admitting: Nurse Practitioner

## 2019-08-21 ENCOUNTER — Other Ambulatory Visit: Payer: Self-pay

## 2019-08-21 VITALS — Ht 65.0 in

## 2019-08-21 DIAGNOSIS — E782 Mixed hyperlipidemia: Secondary | ICD-10-CM

## 2019-08-21 DIAGNOSIS — E1169 Type 2 diabetes mellitus with other specified complication: Secondary | ICD-10-CM

## 2019-08-21 DIAGNOSIS — Z124 Encounter for screening for malignant neoplasm of cervix: Secondary | ICD-10-CM | POA: Diagnosis not present

## 2019-08-21 DIAGNOSIS — E66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: Secondary | ICD-10-CM

## 2019-08-21 DIAGNOSIS — N184 Chronic kidney disease, stage 4 (severe): Secondary | ICD-10-CM | POA: Diagnosis not present

## 2019-08-21 DIAGNOSIS — I5032 Chronic diastolic (congestive) heart failure: Secondary | ICD-10-CM

## 2019-08-21 DIAGNOSIS — Z30431 Encounter for routine checking of intrauterine contraceptive device: Secondary | ICD-10-CM | POA: Diagnosis not present

## 2019-08-21 DIAGNOSIS — J9612 Chronic respiratory failure with hypercapnia: Secondary | ICD-10-CM

## 2019-08-21 DIAGNOSIS — R0981 Nasal congestion: Secondary | ICD-10-CM

## 2019-08-21 MED ORDER — CETIRIZINE HCL 10 MG PO TABS: 10.0000 mg | ORAL_TABLET | Freq: Every day | ORAL | 1 refills | Status: DC

## 2019-08-21 MED ORDER — OMEPRAZOLE 20 MG PO CPDR: 20.0000 mg | DELAYED_RELEASE_CAPSULE | Freq: Every day | ORAL | 0 refills | Status: DC

## 2019-08-21 MED ORDER — BENZONATATE 100 MG PO CAPS: 100.0000 mg | ORAL_CAPSULE | Freq: Three times a day (TID) | ORAL | 1 refills | Status: DC | PRN

## 2019-08-21 NOTE — Progress Notes (Signed)
Virtual Visit via Video Note  I connected with [name] [name] on 08/28/19 at 11:00 AM EST by a video enabled telemedicine application and verified that I am speaking with the correct person using two identifiers.  Location: Patient:home Provider:office Participants: patient and provider I discussed the limitations of evaluation and management by telemedicine and the availability of in person appointments. The patient expressed understanding and agreed to proceed.  CC: Hosipital f/up, discharge 08/14/19, TCM completed 08/15/19.  History of Present Illness: [name] [name] was admitted 12/15/-12/22 due to hypoxia and hypercapnia. She was unable to tolerate downsize of trach cuff, this led to intubation. Her hospitalization was complication by morbid obesity. She was treated empirically with IV and oral abx. She was discharged home with new trach cuff (same original size) and continuous oxygen. Home PT was also ordered but she has not been contacted.She is to f/up with pulmonology and trach clinic. She has not scheduled appt with pulmonology. She did not show for appt with [facility] clinic.Today she reports persistent cough and nasal congestion. She denies any SOB or palpitations or chest pain or O2 sat<90   Cough This is a new problem. The current episode started 1 to 4 weeks ago. The problem has been unchanged. The problem occurs constantly. The cough is non-productive. Associated symptoms include nasal congestion and postnasal drip. Pertinent negatives include no chest pain, chills, fever, heartburn, myalgias, rhinorrhea, shortness of breath or wheezing. The symptoms are aggravated by lying down and cold air. She has tried nothing for the symptoms. Her past medical history is significant for bronchitis and pneumonia.  COVID test completed 2weeks ago, negative  Morbid Obesity: Bypass surgery was recommended by cardiology 11/2018, but she did not schedule appt.  Reviewed and reconciled medication list,   Reviewed lab results and radiology report.  Observations/Objective: Physical Exam  Vitals reviewed. Constitutional: She is oriented to person, place, and time. No distress.  Respiratory: Effort normal.  Coughing spells during video call.  Neurological: She is alert and oriented to person, place, and time.  Skin: Skin is dry.  Psychiatric: She has a normal mood and affect. Her behavior is normal. Thought content normal.   Assessment and Plan: [name] [name] was seen today for hospitalization follow-up and nasal congestion.  Diagnoses and all orders for this visit:  Type 2 diabetes mellitus with other specified complication, without long-term current use of insulin (HCC) -     DM_eye -     Hemoglobin A1c; Future -     Microalbumin / creatinine urine ratio; Future -     Renal Function Panel; Future  CKD (chronic kidney disease) stage 4, GFR 15-29 ml/min (HCC) -     Renal Function Panel; Future  Encounter for Papanicolaou smear for cervical cancer screening -     Ambulatory referral to Gynecology  IUD check up -     Ambulatory referral to Gynecology  Chronic heart failure with preserved ejection fraction ([place]) -     Ambulatory referral to Home Health  Chronic respiratory failure with hypercapnia ([place]) -     Ambulatory referral to [place]  Obesity hypoventilation syndrome ([place]) -     Ambulatory referral to Home Health  Nasal sinus congestion -     cetirizine (ZYRTEC) 10 MG tablet; Take 1 tablet (10 mg total) by mouth at bedtime. -     benzonatate (TESSALON) 100 MG capsule; Take 1 capsule (100 mg total) by mouth 3 (three) times daily as needed for cough. -  omeprazole (PRILOSEC) 20 MG capsule; Take 1 capsule (20 mg total) by mouth at bedtime.  Mixed hyperlipidemia -     HTN_4 Lipid panel; Future   Follow Up Instructions: See avs   I discussed the assessment and treatment plan with the patient. The patient was provided an opportunity to ask questions and all were  answered. The patient agreed with the plan and demonstrated an understanding of the instructions.   The patient was advised to call back or seek an in-person evaluation if the symptoms worsen or if the condition fails to improve as anticipated.   Wilfred Lacy, NP

## 2019-08-21 NOTE — Patient Instructions (Addendum)
Schedule appt with pulmonology, cardiology, and bariatric clinic. Office phone numbers provided. You will be contacted to by home health agency for home PT. You will be contacted to schedule appt with ophthalmology and GYN.  Maintain current medications. Obtain another BP machine and start BP check 2-3x/week and record. Call me or cardiology if BP>140/80 Go to lab for blood draw and urine collection 09/18/19 at 9:30am F/up with me in 30monthor sooner if needed.

## 2019-08-22 ENCOUNTER — Telehealth: Payer: Self-pay | Admitting: Nurse Practitioner

## 2019-08-22 NOTE — Telephone Encounter (Signed)
Ok to order

## 2019-08-22 NOTE — Telephone Encounter (Signed)
Mickel Baas from [facility] called requesting new PT orders.    Call back [phone]

## 2019-08-22 NOTE — Telephone Encounter (Signed)
[name] [name] is aware.

## 2019-08-22 NOTE — Telephone Encounter (Signed)
Baldo Ash please advise

## 2019-08-24 DIAGNOSIS — I272 Pulmonary hypertension, unspecified: Secondary | ICD-10-CM

## 2019-08-30 ENCOUNTER — Telehealth: Payer: Self-pay | Admitting: Acute Care

## 2019-08-30 NOTE — Telephone Encounter (Signed)
Spoke with pt, states she is already following up with trach clinic for trach needs, but was advised by PCP to follow up with our office as well.  Pt is a former AD pt and recently hospitalized.  Scheduled pt for HFU with TP on 09/18/19 at pt request d/t transportation.  Nothing further needed at this time- will close encounter.

## 2019-09-13 NOTE — Progress Notes (Deleted)
CARDIOLOGY OFFICE NOTE  Date:  09/17/2019    [name] [name] Date of Birth: June 28, 1981 Medical Record #[id number]  PCP:  Flossie Buffy, NP  Cardiologist:  Marisa Cyphers  No chief complaint on file.   History of Present Illness: [name] [name] is a 39 y.o. female who presents today for a follow up visit.  Seen for Dr. Marlou Porch. She has primarily followed with me.   She has a hx of HTN, OSA,morbidobesity,&gestational diabetes. She was admitted in 10/17 respiratory failure in the setting of HTN emergency with pulmonary edema and OHS/OSA requiring intubation. She ultimately underwent tracheostomy due to prolonged respiratory failure. She did have prolonged sinus pauses during coughing and straining against the ventilator. This was reviewed with EP and she was not felt to need a PPM as her pauses were likely related to underlying OSA/OHS. Echocardiogram did demonstrate pulmonary hypertension with PASP 42 and normal ejection fraction.She was admitted in 1/18 with acute renal failure in the setting of dehydration. She has been followed in our hypertension clinic to help with control of blood pressure.   Last seen by Dr. Marlou Porch 6/18.  I have seen her sporadically since - she has gained more weight - admits to stress eating as a coping mechanism. Still with trach in place. Trying to get disability. We did a telehealth visit back in 2022-12-04. Father had died - not really clear why at age 53. Continues to struggle with weight loss. Her insurance was sending her out of town for consideration of bariatric surgery and I sent a message to Dr. Hassell Done and he was going to see if he could see her.   She was admitted last month with respiratory failure after attempted trach downsize - she had been to get her COVID test - oxygen sat of 60% noted. She was hypercarbic. She required intubation. There was concern for PE as well and was starting on Heparin - then had negative VQ scan and  negative LE doppler study - heparin was stopped.   The patient {does/does not:200015} have symptoms concerning for COVID-19 infection (fever, chills, cough, or new shortness of breath).   Comes in today. Here with   Past Medical History:  Diagnosis Date  . Chronic diastolic heart failure ([place])    Echo 10/12: EF 50-55 // b. Echo 10/17: EF 50-55, mild BAE, PASP 42 // Echo 10/18: Mild concentric LVH, EF 55-60, normal wall motion, mild MAC, severe LAE, mild TR, PASP 15, trivial pericardial effusion  . CKD (chronic kidney disease) stage 4, GFR 15-29 ml/min (HCC) 10/25/2016   admitted in 2/18 with SCr of 10 in the setting of dehydration from illness, etc >> improved to 5 at DC  . DM2 (diabetes mellitus, type 2) (HCC)    A1c 7.2 in 05/2016  . Gestational diabetes mellitus in pregnancy 05/24/2013  . HTN in pregnancy, chronic 05/24/2013  . Hypertensive heart disease with CHF (congestive heart failure) ([place]) 05/25/2016  . Morbid obesity ([place])   . OSA (obstructive sleep apnea) 07/09/2011   s/p Trach 2/2 prolonged respiratory failure during admx for CHF in 05/2016  . Pulmonary hypertension ([place])    secondary from CHF/OSA  . Sinus tachycardia     Past Surgical History:  Procedure Laterality Date  . TRACHEOSTOMY TUBE PLACEMENT N/A 06/11/2016   Procedure: TRACHEOSTOMY;  Surgeon: Melida Quitter, MD;  Location: [facility];  Service: ENT;  Laterality: N/A;     Medications: No outpatient medications have been marked as taking  for the 09/18/19 encounter (Appointment) with Burtis Junes, NP.     Allergies: Allergies  Allergen Reactions  . No Known Allergies     Social History: The patient  reports that she has never smoked. She has never used smokeless tobacco. She reports that she does not drink alcohol or use drugs.   Family History: The patient's ***family history includes Birth defects in her paternal grandfather; Diabetes in an other family member; Hypertension in her mother.   Review of  Systems: Please see the history of present illness.   All other systems are reviewed and negative.   Physical Exam: VS:  There were no vitals taken for this visit. Marland Kitchen  BMI There is no height or weight on file to calculate BMI.  Wt Readings from Last 3 Encounters:  08/14/19 (!) 396 lb 3.2 oz (179.7 kg)  12/18/18 (!) 399 lb 6.4 oz (181.2 kg)  09/20/18 (!) 407 lb 12.8 oz (185 kg)    General: Pleasant. Well developed, well nourished and in no acute distress.   HEENT: Normal.  Neck: Supple, no JVD, carotid bruits, or masses noted.  Cardiac: ***Regular rate and rhythm. No murmurs, rubs, or gallops. No edema.  Respiratory:  Lungs are clear to auscultation bilaterally with normal work of breathing.  GI: Soft and nontender.  MS: No deformity or atrophy. Gait and ROM intact.  Skin: Warm and dry. Color is normal.  Neuro:  Strength and sensation are intact and no gross focal deficits noted.  Psych: Alert, appropriate and with normal affect.   LABORATORY DATA:  EKG:  EKG {ACTION; IS/IS UKG:[id number]} ordered today. This demonstrates ***.  Lab Results  Component Value Date   WBC 3.9 (L) 08/14/2019   HGB 13.4 08/14/2019   HCT 46.9 (H) 08/14/2019   PLT 194 08/14/2019   GLUCOSE 99 08/14/2019   CHOL 235 (H) 09/20/2018   TRIG 86 09/20/2018   HDL 54 09/20/2018   LDLDIRECT 99.0 09/15/2016   LDLCALC 164 (H) 09/20/2018   ALT 17 08/06/2019   AST 17 08/06/2019   NA 139 08/14/2019   K 4.1 08/14/2019   CL 92 (L) 08/14/2019   CREATININE 1.01 (H) 08/14/2019   BUN 19 08/14/2019   CO2 36 (H) 08/14/2019   TSH 2.970 09/20/2018   INR 1.27 09/18/2016   HGBA1C 6.2 09/15/2016     BNP (last 3 results) Recent Labs    08/06/19 1824  BNP 682.6*    ProBNP (last 3 results) No results for input(s): PROBNP in the last 8760 hours.   Other Studies Reviewed Today:   Lower Extremity Doppler Summary 07/2019 Right: There is no evidence of deep vein thrombosis in the lower extremity. However, portions  of this examination were limited- see technologist comments above. No cystic structure found in the popliteal fossa. Left: There is no evidence of deep vein thrombosis in the lower extremity. However, portions of this examination were limited- see technologist comments above. No cystic structure found in the popliteal fossa.   *See table(s) above for measurements and observations.  Electronically signed by Deitra Mayo MD on 08/07/2019 at 7:58:22 PM.    ECHO IMPRESSIONS 07/2019   1. Left ventricular ejection fraction, by visual estimation, is 60 to 65%. The left ventricle has normal function. There is no left ventricular hypertrophy.  2. The left ventricle has no regional wall motion abnormalities.  3. Global right ventricle has normal systolic function.The right ventricular size is normal. No increase in right ventricular wall thickness.  4.  Left atrial size was mildly dilated.  5. Right atrial size was normal.  6. Trivial pericardial effusion is present.  7. The mitral valve is normal in structure. No evidence of mitral valve regurgitation. No evidence of mitral stenosis.  8. The tricuspid valve is normal in structure. Tricuspid valve regurgitation is not demonstrated.  9. The aortic valve is normal in structure. Aortic valve regurgitation is not visualized. No evidence of aortic valve sclerosis or stenosis. 10. The pulmonic valve was normal in structure. Pulmonic valve regurgitation is not visualized. 11. TR signal is inadequate for assessing pulmonary artery systolic pressure. 12. The inferior vena cava is dilated in size with <50% respiratory variability, suggesting right atrial pressure of 15 mmHg.    ASSESSMENT & PLAN:     1.Recent admission for hypercarbic respiratory failure  2. HTN  3. Chronic diastolic HF  4. Morbid obesity  5. Pulmonary HTN  6. Underlying tracheostomy -   7. Possible LBBB - on prior EKG - needs repeating when we can do in the office  - she has had no active symptoms of chest pain. Breathing is surprisingly stable. Past EKGs with varying changes. Her weight would limit any type of testing. She needs aggressive risk factor modification with weight loss.   8. COVID-19 Education: The signs and symptoms of COVID-19 were discussed with the patient and how to seek care for testing (follow up with PCP or arrange E-visit).  The importance of social distancing, staying at home, hand hygiene and wearing a mask when out in public were discussed today.  Current medicines are reviewed with the patient today.  The patient does not have concerns regarding medicines other than what has been noted above.  The following changes have been made:  See above.  Labs/ tests ordered today include:   No orders of the defined types were placed in this encounter.    Disposition:   FU with *** in {gen number 6-25:638937} {Days to years:10300}.   Patient is agreeable to this plan and will call if any problems develop in the interim.   SignedTruitt Merle, NP  09/17/2019 8:04 AM  [facility] [address] [place], [place]  [address] Phone: [phone] Fax: [phone]

## 2019-09-18 ENCOUNTER — Other Ambulatory Visit (HOSPITAL_COMMUNITY)
Admission: RE | Admit: 2019-09-18 | Discharge: 2019-09-18 | Disposition: A | Discharge status: Home / Self Care | Payer: Medicare Other | Source: Ambulatory Visit | Attending: Acute Care | Admitting: Acute Care

## 2019-09-18 ENCOUNTER — Ambulatory Visit: Payer: Medicare Other | Admitting: Nurse Practitioner

## 2019-09-18 ENCOUNTER — Other Ambulatory Visit: Payer: Medicare Other

## 2019-09-18 ENCOUNTER — Inpatient Hospital Stay: Payer: Medicare Other | Admitting: Adult Health

## 2019-09-18 DIAGNOSIS — Z20822 Contact with and (suspected) exposure to covid-19: Secondary | ICD-10-CM | POA: Diagnosis not present

## 2019-09-18 DIAGNOSIS — Z01812 Encounter for preprocedural laboratory examination: Secondary | ICD-10-CM | POA: Diagnosis present

## 2019-09-19 LAB — SARS CORONAVIRUS 2 (TAT 6-24 HRS): SARS Coronavirus 2: NEGATIVE

## 2019-09-20 ENCOUNTER — Other Ambulatory Visit: Payer: Self-pay

## 2019-09-20 ENCOUNTER — Ambulatory Visit (HOSPITAL_COMMUNITY)
Admission: RE | Admit: 2019-09-20 | Discharge: 2019-09-20 | Disposition: A | Discharge status: Home / Self Care | Payer: Medicare Other | Source: Ambulatory Visit | Attending: Acute Care | Admitting: Acute Care

## 2019-09-20 DIAGNOSIS — I5032 Chronic diastolic (congestive) heart failure: Secondary | ICD-10-CM | POA: Insufficient documentation

## 2019-09-20 DIAGNOSIS — Z430 Encounter for attention to tracheostomy: Secondary | ICD-10-CM | POA: Insufficient documentation

## 2019-09-20 DIAGNOSIS — L910 Hypertrophic scar: Secondary | ICD-10-CM | POA: Diagnosis not present

## 2019-09-20 DIAGNOSIS — I110 Hypertensive heart disease with heart failure: Secondary | ICD-10-CM | POA: Diagnosis not present

## 2019-09-20 DIAGNOSIS — J9610 Chronic respiratory failure, unspecified whether with hypoxia or hypercapnia: Secondary | ICD-10-CM | POA: Insufficient documentation

## 2019-09-20 DIAGNOSIS — G4733 Obstructive sleep apnea (adult) (pediatric): Secondary | ICD-10-CM | POA: Diagnosis not present

## 2019-09-20 DIAGNOSIS — E669 Obesity, unspecified: Secondary | ICD-10-CM | POA: Insufficient documentation

## 2019-09-20 DIAGNOSIS — Z930 Tracheostomy status: Secondary | ICD-10-CM

## 2019-09-20 NOTE — Progress Notes (Addendum)
Thank you for your visit today.   Today we changed you back to the 6 Portex trach    Instructions: Continue our old plan re: changing every 6 weeks or sooner if needed  It is OK to return to red cap; when you use the red cap be sure to use your nasal oxygen  -lets start at 5 liters (I will call Monday and we will follow up) -when using purple PMV use the trach collar at 8 liters -always remove both caps at night for sleep or for naps  As always call me for questions.    Erick Colace ACNP-BC [employer]

## 2019-09-20 NOTE — Progress Notes (Signed)
Tracheostomy Procedure Note  [name] [name] [id number] 05-16-1981  Pre Procedure Tracheostomy Information  Trach Brand: Shiley Size: 6.0 Style: Cuffed Secured by: Sutures  And velcro  Procedure: trach cleaning, trach change and sutures removed    Post Procedure Tracheostomy Information  Trach Brand: Bivona  Portex Size: 6.0 Style: uncuffed Secured by: velcro   Post Procedure Evaluation:   ETCO2 positive color change from yellow to purple : Yes.   Vital signs:VSS, pulse 84, respirations 20 and pulse oximetry 99 % on ^ lpm vaia trach collar  Patients current condition: stable Complications: No apparent complications Trach site exam: clean Wound care done: dry Patient did tolerate procedure well.   Education: none  Prescription needs: none    Additional needs: New PMV given to pt. 6 week f/u trach clinic  appt.

## 2019-09-20 NOTE — Progress Notes (Signed)
[facility]   Reason for visit:  Routine trach change  HPI:  39 year old female. Well known to me. I follow her for trach management and she is co-managed at the pulmonary clinic for OSA and chronic respiratory failure. Up until recently we had been working on CPAP compliance w/ hope to get her decannulated but was admitted from 12/14 to 12/22 for acute decompenstated diastolic heart failure, volume overload and associated hypoxic and hypercarbic respiratory failure requiring mechanical ventilation and placement of up-sized cuffed 6 to facilitate mechanical ventilation. She returns today planned trach care and routine post hospital follow-up  ROS  Review of Systems - History obtained from the patient General ROS: negative for - chills, fatigue, fever or weight gain Psychological ROS: negative ENT ROS: negative Hematological and Lymphatic ROS: negative Endocrine ROS: negative Respiratory ROS: no cough, shortness of breath, or wheezing Cardiovascular ROS: no chest pain or dyspnea on exertion Gastrointestinal ROS: no abdominal pain, change in bowel habits, or black or bloody stools Musculoskeletal ROS: negative Neurological ROS: no TIA or stroke symptoms  Vital signs:  Heart rate 84 respirations 20 saturations 99% on 4 L Exam:  Physical Exam Constitutional:      General: She is not in acute distress.    Appearance: She is not ill-appearing or toxic-appearing.  HENT:     Head: Normocephalic.     Nose: Nose normal.     Mouth/Throat:     Mouth: Mucous membranes are moist.  Eyes:     Pupils: Pupils are equal, round, and reactive to light.  Neck:     Comments: Size 6 cuffed tracheostomy in place prior to change, there was significant keloid formation around the stoma, her voice is raspy and coarse.  Following tracheostomy change (see below) she has significant bleeding after removal of cuffed tracheostomy this resolved with time and observation Cardiovascular:    Rate and Rhythm: Normal rate and regular rhythm.     Pulses: Normal pulses.     Comments: She does have bilateral ankle edema Pulmonary:     Effort: Pulmonary effort is normal.     Breath sounds: Normal breath sounds.  Abdominal:     General: Abdomen is flat. Bowel sounds are normal.  Musculoskeletal:        General: Normal range of motion.     Cervical back: Normal range of motion.  Skin:    General: Skin is warm and dry.     Capillary Refill: Capillary refill takes less than 2 seconds.  Neurological:     General: No focal deficit present.     Mental Status: She is alert.  Psychiatric:        Mood and Affect: Mood normal.     Trach change/procedure: tracheostomy changed from 6 cuffed to 6 portex cuffless      Impression/dx  Trach dependence Chronic diastolic heart failure  Chronic hypoxic respiratory failure  Obesity  HTN  Discussion  Aleicia is stable once again from a tracheostomy standpoint.  I am concerned that in her effort to try to work towards decannulation she was having more hypoxic episodes, and may in fact be more hypoxic than we had actually thought has her last hospitalization was most consistent with a typical acute diastolic heart failure/volume overload presentation that we often see with untreated sleep apnea.  She has a significant amount of trouble with compliance with her CPAP device often napping without it, and only using it for very short intervals even when trying  her best.  She simply has a hard time tolerating it.  I am not sure were going to be able to get the tracheostomy out, at least at this point or further away from that then we were 6 months ago  Plan  Continue routine tracheostomy change, as she has a Portex and its more prone to irritation given its soft plastic material we will plan on a 6-week interval change I will call her again on Monday, February 1 to ensure she is doing well from a tracheostomy change standpoint Additionally I have  told her she can alternate between occlusive red And Passy-Muir valve, specifically have instructed her to wear nasal cannula with the red cap, and the trach collar with the Passy-Muir valve.  Additionally she was once again reminded to keep the trach open during times of sleep We will start her on 4 to 5 L at home via nasal cannula with trach capped, she has a pulse oximeter, when I call her on 2/1 will see where her pulse oximetry has been and make further suggestions in regards to her oxygen at that point    Visit time: 42 minutes.   Erick Colace ACNP-BC [place]

## 2019-09-26 ENCOUNTER — Telehealth: Payer: Self-pay | Admitting: *Deleted

## 2019-09-26 NOTE — Telephone Encounter (Signed)
S/w pt moved from [employer] schedule to [facility] Merle due to pt requested Cecille Rubin.

## 2019-09-26 NOTE — Progress Notes (Deleted)
CARDIOLOGY OFFICE NOTE  Date:  10/01/2019    [name] [name] Date of Birth: [birth year] Medical Record #[id number]  PCP:  Flossie Buffy, NP  Cardiologist:  Marisa Cyphers    No chief complaint on file.   History of Present Illness: [name] [name] is a 39 y.o. female who presents today for a post hospital visit.  Seen for Dr. Marlou Porch.   She has a hx of HTN, OSA,morbidobesity,&gestational diabetes. She was admitted in 10/17 respiratory failure in the setting of HTN emergency with pulmonary edema and OHS/OSA requiring intubation. She ultimately underwent tracheostomy due to prolonged respiratory failure. She did have prolonged sinus pauses during coughing and straining against the ventilator. This was reviewed with EP and she was not felt to need a PPM as her pauses were likely related to underlying OSA/OHS. Echocardiogram did demonstrate pulmonary hypertension with PASP 42 and normal ejection fraction.She was admitted in 1/18 with acute renal failure in the setting of dehydration. She has been followed in our hypertension clinic to help with control of blood pressure.   Last seen by Dr. Marlou Porch 6/18. She has been followed in the HTN clinic since - last visit in March of 2019.  I saw her back in January 2020 -  she needed her medicines refilled. She had gained more weight - over 400#. Admits to stress eating and using food as a coping mechanism. Fortunately, was feeling ok. She loves sugar. No real orthopedic issues. She has a 28 year old daughter. [name] remains in place - she is pretty hesitant to have it removed. She was beginning to consider weight loss surgery. She was to have a court hearing about her disability this month. We did a telehealth visit back in December 20, 2022 - father died unexpectantly at age 26. She was trying to get bariatric surgery here and [place] of [place].   Admitted back in December with hypercarbic respiratory failure after attempted trach downsize -  plan had been for gradual downsizing with goal of removal. Had O2 sat of 60% while undergoing COVID testing - she had had some SOB. She required intubation due to acute hypoxemia and hypercarbic respiratory failure with inability to reinsert her trach - with trauma while attempts at replacing trach. There was concern for a R sided PE initially . She had a new trach placed - #6 [place]. Was weaned and extubated - VQ scan and LE doppler negative. Echo without acute changes and her Heparin was stopped.   The patient {does/does not:200015} have symptoms concerning for COVID-19 infection (fever, chills, cough, or new shortness of breath).   Comes in today. Here with   Past Medical History:  Diagnosis Date  . Chronic diastolic heart failure ([place])    Echo 10/12: EF 50-55 // b. Echo 10/17: EF 50-55, mild BAE, PASP 42 // Echo 10/18: Mild concentric LVH, EF 55-60, normal wall motion, mild MAC, severe LAE, mild TR, PASP 15, trivial pericardial effusion  . CKD (chronic kidney disease) stage 4, GFR 15-29 ml/min (HCC) 10/25/2016   admitted in 2/18 with SCr of 10 in the setting of dehydration from illness, etc >> improved to 5 at DC  . DM2 (diabetes mellitus, type 2) (HCC)    A1c 7.2 in 05/2016  . Gestational diabetes mellitus in pregnancy 05/24/2013  . HTN in pregnancy, chronic 05/24/2013  . Hypertensive heart disease with CHF (congestive heart failure) ([place]) 05/25/2016  . Morbid obesity ([place])   . OSA (obstructive sleep apnea) 07/09/2011  s/p Trach 2/2 prolonged respiratory failure during admx for CHF in 05/2016  . Pulmonary hypertension ([place])    secondary from CHF/OSA  . Sinus tachycardia     Past Surgical History:  Procedure Laterality Date  . TRACHEOSTOMY TUBE PLACEMENT N/A 06/11/2016   Procedure: TRACHEOSTOMY;  Surgeon: Melida Quitter, MD;  Location: [facility] OR;  Service: ENT;  Laterality: N/A;     Medications: No outpatient medications have been marked as taking for the 10/03/19 encounter (Appointment)  with Burtis Junes, NP.     Allergies: Allergies  Allergen Reactions  . No Known Allergies     Social History: The patient  reports that she has never smoked. She has never used smokeless tobacco. She reports that she does not drink alcohol or use drugs.   Family History: The patient's ***family history includes Birth defects in her paternal grandfather; Diabetes in an other family member; Hypertension in her mother.   Review of Systems: Please see the history of present illness.   All other systems are reviewed and negative.   Physical Exam: VS:  There were no vitals taken for this visit. Marland Kitchen  BMI There is no height or weight on file to calculate BMI.  Wt Readings from Last 3 Encounters:  08/14/19 (!) 396 lb 3.2 oz (179.7 kg)  12/18/18 (!) 399 lb 6.4 oz (181.2 kg)  09/20/18 (!) 407 lb 12.8 oz (185 kg)    General: Pleasant. Well developed, well nourished and in no acute distress.   HEENT: Normal.  Neck: Supple, no JVD, carotid bruits, or masses noted.  Cardiac: ***Regular rate and rhythm. No murmurs, rubs, or gallops. No edema.  Respiratory:  Lungs are clear to auscultation bilaterally with normal work of breathing.  GI: Soft and nontender.  MS: No deformity or atrophy. Gait and ROM intact.  Skin: Warm and dry. Color is normal.  Neuro:  Strength and sensation are intact and no gross focal deficits noted.  Psych: Alert, appropriate and with normal affect.   LABORATORY DATA:  EKG:  EKG {ACTION; IS/IS URK:[id number]} ordered today. This demonstrates ***.  Lab Results  Component Value Date   WBC 3.9 (L) 08/14/2019   HGB 13.4 08/14/2019   HCT 46.9 (H) 08/14/2019   PLT 194 08/14/2019   GLUCOSE 99 08/14/2019   CHOL 235 (H) 09/20/2018   TRIG 86 09/20/2018   HDL 54 09/20/2018   LDLDIRECT 99.0 09/15/2016   LDLCALC 164 (H) 09/20/2018   ALT 17 08/06/2019   AST 17 08/06/2019   NA 139 08/14/2019   K 4.1 08/14/2019   CL 92 (L) 08/14/2019   CREATININE 1.01 (H) 08/14/2019     BUN 19 08/14/2019   CO2 36 (H) 08/14/2019   TSH 2.970 09/20/2018   INR 1.27 09/18/2016   HGBA1C 6.2 09/15/2016     BNP (last 3 results) Recent Labs    08/06/19 1824  BNP 682.6*    ProBNP (last 3 results) No results for input(s): PROBNP in the last 8760 hours.   Other Studies Reviewed Today:  ECHO IMPRESSIONS 07/2019  1. Left ventricular ejection fraction, by visual estimation, is 60 to  65%. The left ventricle has normal function. There is no left ventricular  hypertrophy.  2. The left ventricle has no regional wall motion abnormalities.  3. Global right ventricle has normal systolic function.The right  ventricular size is normal. No increase in right ventricular wall  thickness.  4. Left atrial size was mildly dilated.  5. Right atrial size was normal.  6. Trivial pericardial effusion is present.  7. The mitral valve is normal in structure. No evidence of mitral valve  regurgitation. No evidence of mitral stenosis.  8. The tricuspid valve is normal in structure. Tricuspid valve  regurgitation is not demonstrated.  9. The aortic valve is normal in structure. Aortic valve regurgitation is  not visualized. No evidence of aortic valve sclerosis or stenosis.  10. The pulmonic valve was normal in structure. Pulmonic valve  regurgitation is not visualized.  11. TR signal is inadequate for assessing pulmonary artery systolic  pressure.  12. The inferior vena cava is dilated in size with <50% respiratory  variability, suggesting right atrial pressure of 15 mmHg.     ASSESSMENT & PLAN:     1. Recent respiratory failure associated with attempts at downsizing trach -   2. Hypertensive heart disease with chronic diastolic congestive heart failure ([place]) - her body habitus makes it difficult to really know what her BP is - seems ok by her readings at home. Lasix refilled today.   2.Chronic diastolic heart failure ([place]) Her weightis down a few pounds.  She  is not short of breath. See #3.  3.Morbid obesity ([place]) This has been the crux of her issues. She wishes to try and proceed with bariatric surgery and would prefer to do this in [facility] since she is followed by other providers and has her trach still in place. Will send message to Dr. Kaylyn Lim for his input. She could possibly be eligible at [facility] or [place]. She is trying to eat a little better - stress plays a big role for her.   4.Pulmonary hypertension ([place]) She continues to follow with pulmonary. She uses oxygen prn.   5. Possible LBBB - on prior EKG - needs repeating when we can do in the office - she has had no active symptoms of chest pain. Breathing is surprisingly stable. Past EKGs with varying changes. Her weight would limit any type of testing. She needs aggressive risk factor modification with weight loss.   Marland Kitchen COVID-19 Education: The signs and symptoms of COVID-19 were discussed with the patient and how to seek care for testing (follow up with PCP or arrange E-visit).  The importance of social distancing, staying at home, hand hygiene and wearing a mask when out in public were discussed today.  Current medicines are reviewed with the patient today.  The patient does not have concerns regarding medicines other than what has been noted above.  The following changes have been made:  See above.  Labs/ tests ordered today include:   No orders of the defined types were placed in this encounter.    Disposition:   FU with *** in {gen number [id number]:[id number]} {Days to years:10300}.   Patient is agreeable to this plan and will call if any problems develop in the interim.   SignedTruitt Merle, NP  10/01/2019 10:47 AM  [facility] [address] [place], [place]  [address] Phone: [phone] Fax: [phone]

## 2019-10-03 ENCOUNTER — Ambulatory Visit: Payer: Medicare Other | Admitting: Nurse Practitioner

## 2019-10-04 NOTE — Progress Notes (Signed)
CARDIOLOGY OFFICE NOTE  Date:  10/09/2019    [name] [name] Date of Birth: 1980-09-26 Medical Record #[id number]  PCP:  Flossie Buffy, NP  Cardiologist:  Marisa Cyphers  Chief Complaint  Patient presents with  . Follow-up    Seen for Dr. Marlou Porch    History of Present Illness: [name] [name] is a 39 y.o. female who presents today for a follow up visit.  Seen for Dr. Marlou Porch.   She has a hx of HTN, OSA,morbidobesity,&gestational diabetes. She was admitted in 10/17 respiratory failure in the setting of HTN emergency with pulmonary edema and OHS/OSA requiring intubation. She ultimately underwent tracheostomy due to prolonged respiratory failure. She did have prolonged sinus pauses during coughing and straining against the ventilator. This was reviewed with EP and she was not felt to need a PPM as her pauses were likely related to underlying OSA/OHS. Echocardiogram did demonstrate pulmonary hypertension with PASP 42 and normal ejection fraction.She was admitted in 1/18 with acute renal failure in the setting of dehydration. She has been followed in our hypertension clinic to help with control of blood pressure.   Last seen by Dr. Marlou Porch 6/18. She has been followed in the HTN clinic as well as by me since.   When seen back in January 2020 - she needed her medicines refilled. She had gained more weight - over 400#. Admitted to stress eating and using food as a coping mechanism. Fortunately, was feeling ok. She loves sugar. No real orthopedic issues. She has a 57 year old daughter. [name] remains in place - she was pretty hesitant to have it removed. She was beginning to consider weight loss surgery. She was to have a court hearing about her disability this month. We did a virtual visit back in late April - she was doing ok - her father had just died - not clear why - he was 64 - no autopsy.   She was not eligible for weight loss surgery in [place] - I did reach out  to Dr. Hassell Done who said he would try to get her seen.   She was admitted mid December with acute hypercarbic respiratory failure after attempted trach downsize. In anticipation of the trach change, she had a COVID test - found to have a sat of 60%. Plan had been for gradual downsizing of trach with goal of removal. The ER attempted to change her trach - was unable to pass - ended up being intubated. Noted "stenotic and small vocal cords". There was concern for possible PE - not noted on VQ scan - did not end up on anticoagulation. New trach placed on 12/15.  She was weaned from ventilator and on ATC 40% with sats 98-100%. She was d/c'd with continuous oxygen to wean at home.    The patient does not have symptoms concerning for COVID-19 infection (fever, chills, cough, or new shortness of breath).   Comes in today. Here alone. She is on trach collar. Says "taking it one day at a time". Seeing pulmonary soon and to have discussion about cutting back on her oxygen requirements. No plan to downsize her trach or certainly remove. She says she is ready to think about bariatric surgery. She is planning on signing up for the meeting. She knows her options are quite limited. Her daughter is now 71 years old. No chest pain. She is still fairly mobile and understands that she needs to stay mobile.    Past Medical History:  Diagnosis Date  . Chronic diastolic heart failure ([place])    Echo 10/12: EF 50-55 // b. Echo 10/17: EF 50-55, mild BAE, PASP 42 // Echo 10/18: Mild concentric LVH, EF 55-60, normal wall motion, mild MAC, severe LAE, mild TR, PASP 15, trivial pericardial effusion  . CKD (chronic kidney disease) stage 4, GFR 15-29 ml/min (HCC) 10/25/2016   admitted in 2/18 with SCr of 10 in the setting of dehydration from illness, etc >> improved to 5 at DC  . DM2 (diabetes mellitus, type 2) (HCC)    A1c 7.2 in 05/2016  . Gestational diabetes mellitus in pregnancy 05/24/2013  . HTN in pregnancy, chronic 05/24/2013    . Hypertensive heart disease with CHF (congestive heart failure) ([place]) 05/25/2016  . Morbid obesity ([place])   . OSA (obstructive sleep apnea) 07/09/2011   s/p Trach 2/2 prolonged respiratory failure during admx for CHF in 05/2016  . Pulmonary hypertension ([place])    secondary from CHF/OSA  . Sinus tachycardia     Past Surgical History:  Procedure Laterality Date  . TRACHEOSTOMY TUBE PLACEMENT N/A 06/11/2016   Procedure: TRACHEOSTOMY;  Surgeon: Melida Quitter, MD;  Location: [facility];  Service: ENT;  Laterality: N/A;     Medications: Current Meds  Medication Sig  . amLODipine (NORVASC) 10 MG tablet Take 1 tablet (10 mg total) by mouth daily.  . benzonatate (TESSALON) 100 MG capsule Take 1 capsule (100 mg total) by mouth 3 (three) times daily as needed for cough.  . carvedilol (COREG) 25 MG tablet Take 1 tablet (25 mg total) by mouth 2 (two) times daily.  . cetirizine (ZYRTEC) 10 MG tablet Take 1 tablet (10 mg total) by mouth at bedtime.  . fluticasone (FLONASE) 50 MCG/ACT nasal spray Place 2 sprays into both nostrils 2 (two) times daily.  . furosemide (LASIX) 40 MG tablet Take 1 tablet (40 mg total) by mouth daily.  . hydrALAZINE (APRESOLINE) 100 MG tablet TAKE 1 TABLET BY MOUTH THREE TIMES A DAY  . isosorbide dinitrate (ISORDIL) 30 MG tablet Take 1 tablet (30 mg total) by mouth 3 (three) times daily.  Marland Kitchen omeprazole (PRILOSEC) 20 MG capsule Take 1 capsule (20 mg total) by mouth at bedtime.     Allergies: Allergies  Allergen Reactions  . No Known Allergies     Social History: The patient  reports that she has never smoked. She has never used smokeless tobacco. She reports that she does not drink alcohol or use drugs.   Family History: The patient's family history includes Birth defects in her paternal grandfather; Diabetes in an other family member; Hypertension in her mother.   Review of Systems: Please see the history of present illness.   All other systems are reviewed and  negative.   Physical Exam: VS:  BP (!) 142/80   Pulse 90   Ht _0  (1.651 m)   Wt (!) 397 lb (180.1 kg)   SpO2 92%   BMI 66.06 kg/m  .  BMI Body mass index is 66.06 kg/m.  Wt Readings from Last 3 Encounters:  10/09/19 (!) 397 lb (180.1 kg)  08/14/19 (!) 396 lb 3.2 oz (179.7 kg)  12/18/18 (!) 399 lb 6.4 oz (181.2 kg)   BP taken in the forearm.   General: Pleasant. She got very tearful during our visit. She is in no acute distress.  On trach collar.  Neck: Thick.  Cardiac: Regular rate and rhythm. No murmurs, rubs, or gallops. No significant edema.  Respiratory:  Lungs  are fairly clear to auscultation bilaterally with normal work of breathing.  GI: Soft and nontender.  MS: No deformity or atrophy. Gait and ROM intact.  Skin: Warm and dry. Color is normal.  Neuro:  Strength and sensation are intact and no gross focal deficits noted.  Psych: Alert, appropriate and with normal affect.   LABORATORY DATA:  EKG:  EKG is not ordered today.  Lab Results  Component Value Date   WBC 3.9 (L) 08/14/2019   HGB 13.4 08/14/2019   HCT 46.9 (H) 08/14/2019   PLT 194 08/14/2019   GLUCOSE 99 08/14/2019   CHOL 235 (H) 09/20/2018   TRIG 86 09/20/2018   HDL 54 09/20/2018   LDLDIRECT 99.0 09/15/2016   LDLCALC 164 (H) 09/20/2018   ALT 17 08/06/2019   AST 17 08/06/2019   NA 139 08/14/2019   K 4.1 08/14/2019   CL 92 (L) 08/14/2019   CREATININE 1.01 (H) 08/14/2019   BUN 19 08/14/2019   CO2 36 (H) 08/14/2019   TSH 2.970 09/20/2018   INR 1.27 09/18/2016   HGBA1C 6.2 09/15/2016     BNP (last 3 results) Recent Labs    08/06/19 1824  BNP 682.6*    ProBNP (last 3 results) No results for input(s): PROBNP in the last 8760 hours.   Other Studies Reviewed Today:  LE Venous Doppler Summary 07/2019:  Right: There is no evidence of deep vein thrombosis in the lower  extremity. However, portions of this examination were limited- see  technologist comments above. No cystic structure  found in the popliteal  fossa.  Left: There is no evidence of deep vein thrombosis in the lower extremity.  However, portions of this examination were limited- see technologist  comments above. No cystic structure found in the popliteal fossa.    *See table(s) above for measurements and observations.   Electronically signed by Deitra Mayo MD on 08/07/2019 at 7:58:22  PM.    ECHO IMPRESSIONS 07/2019  1. Left ventricular ejection fraction, by visual estimation, is 60 to  65%. The left ventricle has normal function. There is no left ventricular  hypertrophy.  2. The left ventricle has no regional wall motion abnormalities.  3. Global right ventricle has normal systolic function.The right  ventricular size is normal. No increase in right ventricular wall  thickness.  4. Left atrial size was mildly dilated.  5. Right atrial size was normal.  6. Trivial pericardial effusion is present.  7. The mitral valve is normal in structure. No evidence of mitral valve  regurgitation. No evidence of mitral stenosis.  8. The tricuspid valve is normal in structure. Tricuspid valve  regurgitation is not demonstrated.  9. The aortic valve is normal in structure. Aortic valve regurgitation is  not visualized. No evidence of aortic valve sclerosis or stenosis.  10. The pulmonic valve was normal in structure. Pulmonic valve  regurgitation is not visualized.  11. TR signal is inadequate for assessing pulmonary artery systolic  pressure.  12. The inferior vena cava is dilated in size with <50% respiratory  variability, suggesting right atrial pressure of 15 mmHg.    ASSESSMENT & PLAN:     1.Recent admission for hypercarbic respiratory failure in setting of trach change/downsizing - remains on trach collar. Very difficult and challenging situation.   2. HTN - BP is ok - no changes made - would continue with her current regimen.   3. Chronic diastolic HF - recent events noted.  Seems fairly stable at this time but overall  situation is very tenuous.   4. Morbid obesity - this remains the crux of her issues - she loves sugar - used as a coping mechanism - she admits this.   5. Pulmonary HTN - most likely multifactorial from obesity, cor pulmonale, diastolic HF, etc. Options are limited.   6. RBBB - chronic  7. COVID-19 Education: The signs and symptoms of COVID-19 were discussed with the patient and how to seek care for testing (follow up with PCP or arrange E-visit).  The importance of social distancing, staying at home, hand hygiene and wearing a mask when out in public were discussed today.  Current medicines are reviewed with the patient today.  The patient does not have concerns regarding medicines other than what has been noted above.  The following changes have been made:  See above.  Labs/ tests ordered today include:   No orders of the defined types were placed in this encounter.    Disposition:   FU with me in 4 months.    Patient is agreeable to this plan and will call if any problems develop in the interim.   SignedTruitt Merle, NP  10/09/2019 2:49 PM  [facility] [address] [place], [place]  [address] Phone: [phone] Fax: [phone]

## 2019-10-09 ENCOUNTER — Ambulatory Visit (INDEPENDENT_AMBULATORY_CARE_PROVIDER_SITE_OTHER): Payer: Medicare Other | Admitting: Nurse Practitioner

## 2019-10-09 ENCOUNTER — Encounter: Payer: Self-pay | Admitting: Nurse Practitioner

## 2019-10-09 ENCOUNTER — Other Ambulatory Visit: Payer: Self-pay

## 2019-10-09 VITALS — BP 142/80 | HR 90 | Ht 65.0 in | Wt 397.0 lb

## 2019-10-09 DIAGNOSIS — I272 Pulmonary hypertension, unspecified: Secondary | ICD-10-CM

## 2019-10-09 DIAGNOSIS — Z7189 Other specified counseling: Secondary | ICD-10-CM

## 2019-10-09 DIAGNOSIS — I110 Hypertensive heart disease with heart failure: Secondary | ICD-10-CM

## 2019-10-09 DIAGNOSIS — I5032 Chronic diastolic (congestive) heart failure: Secondary | ICD-10-CM | POA: Diagnosis not present

## 2019-10-09 NOTE — Patient Instructions (Addendum)
After Visit Summary:  We will be checking the following labs today - NONE   Medication Instructions:    Continue with your current medicines.    If you need a refill on your cardiac medications before your next appointment, please call your pharmacy.     Testing/Procedures To Be Arranged:  N/A  Follow-Up:  See me in 4 months   At [facility], you and your health needs are our priority.  As part of our continuing mission to provide you with exceptional heart care, we have created designated Provider Care Teams.  These Care Teams include your primary Cardiologist (physician) and Advanced Practice Providers (APPs -  Physician Assistants and Nurse Practitioners) who all work together to provide you with the care you need, when you need it.  Special Instructions:  . Stay safe, stay home, wash your hands for at least 20 seconds and wear a mask when out in public.  . It was good to talk with you today.  . Think about what we talked about.  . I will reach back out to Dr. Hassell Done for you.    Call the [place] office at [phone] if you have any questions, problems or concerns.

## 2019-10-11 ENCOUNTER — Other Ambulatory Visit: Payer: Medicare Other

## 2019-10-11 ENCOUNTER — Ambulatory Visit: Payer: Medicare Other | Admitting: Cardiology

## 2019-10-11 ENCOUNTER — Telehealth (INDEPENDENT_AMBULATORY_CARE_PROVIDER_SITE_OTHER): Payer: Medicare Other | Admitting: Adult Health

## 2019-10-11 DIAGNOSIS — Z930 Tracheostomy status: Secondary | ICD-10-CM

## 2019-10-11 DIAGNOSIS — G4733 Obstructive sleep apnea (adult) (pediatric): Secondary | ICD-10-CM

## 2019-10-11 DIAGNOSIS — J9612 Chronic respiratory failure with hypercapnia: Secondary | ICD-10-CM

## 2019-10-11 NOTE — Patient Instructions (Signed)
Continue on trach collar at .40% .  May use Passy-Muir during the daytime with trach collar Continue with daily trach care May try to cap trach during daytime and use nasal cannula with oxygen at 4 L-goal for O2 saturation should be greater than 88 to 90%.  Please use trach collar and keep trach uncapped at bedtime Activity as tolerated Work on healthy weight Follow-up in trach clinic as planned Follow-up in 6 weeks with Dr. Halford Chessman or Yoshie Kosel NP and As needed

## 2019-10-11 NOTE — Progress Notes (Signed)
Virtual Visit via Video Note  I connected with [name] [name] on 10/11/19 at 11:00 AM EST by a video enabled telemedicine application and verified that I am speaking with the correct person using two identifiers.  Location: Patient: Home  Provider: Home    I discussed the limitations of evaluation and management by telemedicine and the availability of in person appointments. The patient expressed understanding and agreed to proceed.  History of Present Illness: 39 year old female followed for severe obstructive sleep apnea with chronic tracheostomy She was diagnosed with sleep apnea in October 2012 with an AHI at 75/hour. Hospitalization October 2017 with critical illness requiring tracheostomy  Today's video visit is a follow-up for severe obstructive sleep apnea with chronic tracheostomy, chronic hypoxic respiratory failure, post hospitalization visit.  Patient was last seen in the pulmonary clinic July 2019.  At last pulmonary clinic office visit patient was working towards decannulation with capped trach and using CPAP at bedtime.  Patient had CPAP intolerance and issues with compliance.  She says she was doing well up until hospitalization in December 2020.  She was admitted for acute on chronic hypoxic and hypercarbic respiratory failure with decompensated diastolic heart failure with volume overload.  She did have respiratory distress on admission and difficulty with trach requiring intubation and upsize to a cuffed #6 trach.  Initially thought to have a possible pulmonary embolism but this was ruled out.  Since discharge patient says she is doing okay.  She was discharged on continuous home oxygen which is new for her.  She is currently on trach collar at .40 .  She is able to use Passy-Muir valve without difficulty.  She was seen in the trach clinic 2 weeks ago and change from a cuffed  #6 to a 6 Portex cuffless.  She says she is doing well with this. She was also recommended it because she  could start capping her trach and using nasal cannula oxygen at 4 L.  She is not attempted this as of yet.  He is on trach collar at bedtime.  She is aware not to use her Passy-Muir at bedtime. She denies any flare of cough or wheezing.  No increased leg swelling or orthopnea.       Observations/Objective: 10/11/2019 -appears in no acute distress  No notable increased work work of breathing..  O2 saturation 96% on trach collar  11/16/2016 sleep study showed severe sleep apnea, AHI 59, optimal C Pap pressure was 12 cm of H2O (done with capped trach )   . Assessment and Plan: Severe obstructive sleep apnea , tracheostomy dependent  Chronic hypoxic and hypercarbic respiratory failure-requires continuous oxygen therapy currently.  Morbid obesity  Plan  Patient Instructions  Continue on trach collar at .40% .  May use Passy-Muir during the daytime with trach collar Continue with daily trach care May try to cap trach during daytime and use nasal cannula with oxygen at 4 L-goal for O2 saturation should be greater than 88 to 90%.  Please use trach collar and keep trach uncapped at bedtime Activity as tolerated Work on healthy weight Follow-up in trach clinic as planned Follow-up in 6 weeks with Dr. Halford Chessman or Anayely Constantine NP and As needed        Follow Up Instructions:    I discussed the assessment and treatment plan with the patient. The patient was provided an opportunity to ask questions and all were answered. The patient agreed with the plan and demonstrated an understanding of the instructions.  The patient was advised to call back or seek an in-person evaluation if the symptoms worsen or if the condition fails to improve as anticipated.  I provided 30  minutes of non-face-to-face time during this encounter.   [name], NP

## 2019-10-16 ENCOUNTER — Telehealth: Payer: Self-pay | Admitting: Adult Health

## 2019-10-16 NOTE — Telephone Encounter (Signed)
Patient had video visit with TP on 2.18.21 and needs follow up in 6 weeks with Dr Halford Chessman or TP  LMOM TCB x1 to schedule follow up appt

## 2019-10-18 NOTE — Telephone Encounter (Signed)
Patient ha been scheduled with TP on 4.7.21 by the front staff Will sign off

## 2019-10-22 ENCOUNTER — Other Ambulatory Visit: Payer: Medicare Other

## 2019-10-31 ENCOUNTER — Telehealth: Payer: Self-pay | Admitting: Adult Health

## 2019-10-31 NOTE — Telephone Encounter (Signed)
Spoke with the pt  She is asking what to keep her o2 set at  I advised 4lpm with the trach capped as per her AVS stated at last ov  She verbalized understanding  Nothing further needed

## 2019-11-05 ENCOUNTER — Other Ambulatory Visit (HOSPITAL_COMMUNITY)
Admission: RE | Admit: 2019-11-05 | Discharge: 2019-11-05 | Disposition: A | Discharge status: Home / Self Care | Payer: Medicare Other | Source: Ambulatory Visit | Attending: Adult Health | Admitting: Adult Health

## 2019-11-05 DIAGNOSIS — Z20822 Contact with and (suspected) exposure to covid-19: Secondary | ICD-10-CM | POA: Diagnosis not present

## 2019-11-05 DIAGNOSIS — Z01812 Encounter for preprocedural laboratory examination: Secondary | ICD-10-CM | POA: Diagnosis present

## 2019-11-05 LAB — SARS CORONAVIRUS 2 (TAT 6-24 HRS): SARS Coronavirus 2: NEGATIVE

## 2019-11-07 ENCOUNTER — Other Ambulatory Visit: Payer: Self-pay | Admitting: Nurse Practitioner

## 2019-11-07 ENCOUNTER — Ambulatory Visit (HOSPITAL_COMMUNITY)
Admission: RE | Admit: 2019-11-07 | Discharge: 2019-11-07 | Disposition: A | Discharge status: Home / Self Care | Payer: Medicare Other | Source: Ambulatory Visit | Attending: Acute Care | Admitting: Acute Care

## 2019-11-07 ENCOUNTER — Other Ambulatory Visit: Payer: Self-pay

## 2019-11-07 DIAGNOSIS — I110 Hypertensive heart disease with heart failure: Secondary | ICD-10-CM | POA: Diagnosis not present

## 2019-11-07 DIAGNOSIS — I5032 Chronic diastolic (congestive) heart failure: Secondary | ICD-10-CM | POA: Insufficient documentation

## 2019-11-07 DIAGNOSIS — Z4689 Encounter for fitting and adjustment of other specified devices: Secondary | ICD-10-CM | POA: Insufficient documentation

## 2019-11-07 DIAGNOSIS — G4733 Obstructive sleep apnea (adult) (pediatric): Secondary | ICD-10-CM | POA: Insufficient documentation

## 2019-11-07 DIAGNOSIS — J9611 Chronic respiratory failure with hypoxia: Secondary | ICD-10-CM | POA: Insufficient documentation

## 2019-11-07 DIAGNOSIS — I10 Essential (primary) hypertension: Secondary | ICD-10-CM

## 2019-11-07 DIAGNOSIS — Z930 Tracheostomy status: Secondary | ICD-10-CM | POA: Diagnosis not present

## 2019-11-07 NOTE — Progress Notes (Addendum)
[facility]   Reason for visit:  Routine trach care  HPI:  39 year old female. Well known to me w/ chronic respiratory failure and trach dependence d/t OSA/OHS and chronic diastolic HF. Recently hospitalized for decompensated diastolic HF and volume overload. This is her second post-hospital visit. Last one was near end of January. She is looking much closer to her baseline. Is in no distress and is gradually increasing her activity level. She presents today for routine trach change  ROS  Review of Systems - History obtained from the patient General ROS: negative for - chills, fatigue, fever, sleep disturbance, weight gain or weight loss Psychological ROS: negative for - anxiety ENT ROS: negative for - nasal congestion or is having vocal hoarseness which is typical for when she needs her trach changes and did report increased nasal drainage when trying [place]  Allergy and Immunology ROS: negative Endocrine ROS: negative Respiratory ROS: negative for - cough, pleuritic pain, shortness of breath, sputum changes, tachypnea or wheezing Cardiovascular ROS: no chest pain or dyspnea on exertion Gastrointestinal ROS: no abdominal pain, change in bowel habits, or black or bloody stools Genito-Urinary ROS: no dysuria, trouble voiding, or hematuria Musculoskeletal ROS: negative Neurological ROS: no TIA or stroke symptoms  Vital signs:  HR 88, RR 20 sats 94% on 8 liters trach collar mask3 Exam:  Physical Exam Vitals reviewed.  Constitutional:      General: She is not in acute distress.    Appearance: Normal appearance. She is not ill-appearing or toxic-appearing.  HENT:     Head: Normocephalic and atraumatic.     Comments: [name] site unremarkable. Phonation quality hoarse    Nose: Nose normal. No congestion or rhinorrhea.     Mouth/Throat:     Pharynx: No oropharyngeal exudate.  Eyes:     Pupils: Pupils are equal, round, and reactive to light.  Cardiovascular:     Rate  and Rhythm: Normal rate.  Pulmonary:     Effort: Pulmonary effort is normal. No respiratory distress.     Breath sounds: Normal breath sounds. No wheezing.  Musculoskeletal:        General: Normal range of motion.     Cervical back: Normal range of motion.  Skin:    General: Skin is warm and dry.     Capillary Refill: Capillary refill takes less than 2 seconds.  Neurological:     General: No focal deficit present.     Mental Status: She is alert.  Psychiatric:        Mood and Affect: Mood normal.     Trach change/procedure: 6 portex bivonna changed. Trach site unremarkable. Tolerated change w/out difficulty.       Impression/dx   Chronic hypoxic respiratory failure Trach dependence OSA OHS  Diastolic HF HTN    Discussion   Janise is slowly returning to baseline. I have suggested to her that we table CPAP trials until we get fully recovered and I also think perhaps weight loss would also be helpful as it would decrease her need for higher pressures on the CPAP machine. She has asked about a smaller more portable oxygen such as one of the small shoulder liquid oxygen devices and I have sent a message to the team at the pulmonary office about this. I think however from a trach stand-point I think we need to put hopes for eventually decannulating on hold, get more weight off from her and ensure she is more fully recovered. I am hopeful that  we will one day be able to do this but Valentina has had a real challenge w/ the CPAP devices as far as comfort and confidence in using it.  Plan   Cont routine trach care OK to cap trach again and use nasal canula oxygen  I have advised her that I do think she should get the COVID-19 vaccine. She falls in group 4. Here that is supposed to open March 24. I will give her a call w/ the info from [facility]. Hope she can get on list No CPAP for now.  Will see her again in 5-6 weeks for trach change   Visit time: 22 minutes.   Erick Colace ACNP-BC [place]

## 2019-11-07 NOTE — Progress Notes (Signed)
Pt notified via [place].

## 2019-11-07 NOTE — Progress Notes (Signed)
Tracheostomy Procedure Note  [name] [name] [id number] Oct 14, 1980  Pre Procedure Tracheostomy Information  Trach Brand: Portex Bivonna  Size: 6.0   Style: Uncuffed Secured by: Velcro   Procedure: VVS  HR 88  rr 20   Oxygen sats on 8 LPM via trach collar  94%     Post Procedure Tracheostomy Information  Trach Brand: Portex Bivonna Size: 6.0 Style: Uncuffed Secured by: Velcro   Post Procedure Evaluation:  VVS  RR20    HR84   Oxygen saturation on 8 lpm trach collar 95%  ETCO2 positive color change from yellow to purple : Yes.    Vital signs:blood pressure VSS pulse 84, respirations 20and pulse oximetry  95 % on 8  LPM via trach collar Patients current condition: stable Complications: No apparent complications Trach site exam: clean Wound care done: dry Patient did tolerate procedure well.   Education: none  Prescription needs: none    Additional needs: New PMV given to  Patient F/u trach clinic appt 6 weeks from today

## 2019-11-13 ENCOUNTER — Other Ambulatory Visit: Payer: Self-pay | Admitting: Acute Care

## 2019-11-13 MED ORDER — AMOXICILLIN-POT CLAVULANATE 875-125 MG PO TABS: 1.0000 | ORAL_TABLET | Freq: Two times a day (BID) | ORAL | 0 refills | Status: DC

## 2019-11-13 NOTE — Progress Notes (Signed)
Pt called w/ worsening purulent sputum. More tracheal secretions. Suspect either mild sinusitis vs tracheobronchitis. Has not responded to OTC therapy.   Plan augmentin X 7d mucinex BID   Erick Colace ACNP-BC [place] Pager # [phone] OR # [phone] if no answer

## 2019-11-19 ENCOUNTER — Other Ambulatory Visit: Payer: Self-pay

## 2019-11-20 ENCOUNTER — Ambulatory Visit (INDEPENDENT_AMBULATORY_CARE_PROVIDER_SITE_OTHER): Payer: Medicare Other | Admitting: Nurse Practitioner

## 2019-11-20 ENCOUNTER — Encounter: Payer: Self-pay | Admitting: Nurse Practitioner

## 2019-11-20 VITALS — BP 130/80 | HR 90 | Temp 97.1°F | Ht 65.0 in | Wt >= 6400 oz

## 2019-11-20 DIAGNOSIS — E782 Mixed hyperlipidemia: Secondary | ICD-10-CM

## 2019-11-20 DIAGNOSIS — E1169 Type 2 diabetes mellitus with other specified complication: Secondary | ICD-10-CM | POA: Diagnosis not present

## 2019-11-20 DIAGNOSIS — R05 Cough: Secondary | ICD-10-CM | POA: Diagnosis not present

## 2019-11-20 DIAGNOSIS — N184 Chronic kidney disease, stage 4 (severe): Secondary | ICD-10-CM

## 2019-11-20 DIAGNOSIS — R053 Chronic cough: Secondary | ICD-10-CM

## 2019-11-20 LAB — BASIC METABOLIC PANEL
BUN: 16 mg/dL (ref 6–23)
CO2: 36 mEq/L — ABNORMAL HIGH (ref 19–32)
Calcium: 9.1 mg/dL (ref 8.4–10.5)
Chloride: 98 mEq/L (ref 96–112)
Creatinine, Ser: 0.98 mg/dL (ref 0.40–1.20)
GFR: 75.93 mL/min (ref >60.00)
Glucose, Bld: 127 mg/dL — ABNORMAL HIGH (ref 70–99)
Potassium: 4.5 mEq/L (ref 3.5–5.1)
Sodium: 139 mEq/L (ref 135–145)

## 2019-11-20 LAB — LIPID PANEL
Cholesterol: 213 mg/dL — ABNORMAL HIGH (ref 0–200)
HDL: 43.2 mg/dL (ref >39.00)
LDL Cholesterol: 144 mg/dL — ABNORMAL HIGH (ref 0–99)
NonHDL: 169.46
Total CHOL/HDL Ratio: 5
Triglycerides: 125 mg/dL (ref 0.0–149.0)
VLDL: 25 mg/dL (ref 0.0–40.0)

## 2019-11-20 LAB — HEPATIC FUNCTION PANEL
ALT: 12 U/L (ref 0–35)
AST: 12 U/L (ref 0–37)
Albumin: 3.7 g/dL (ref 3.5–5.2)
Alkaline Phosphatase: 92 U/L (ref 39–117)
Bilirubin, Direct: 0.1 mg/dL (ref 0.0–0.3)
Total Bilirubin: 0.6 mg/dL (ref 0.2–1.2)
Total Protein: 7.5 g/dL (ref 6.0–8.3)

## 2019-11-20 LAB — MICROALBUMIN / CREATININE URINE RATIO
Creatinine,U: 13.8 mg/dL
Microalb Creat Ratio: 5.1 mg/g (ref 0.0–30.0)
Microalb, Ur: <0.7 mg/dL (ref 0.0–1.9)

## 2019-11-20 LAB — HEMOGLOBIN A1C: Hgb A1c MFr Bld: 6.2 % (ref 4.6–6.5)

## 2019-11-20 MED ORDER — BENZONATATE 100 MG PO CAPS: 100.0000 mg | ORAL_CAPSULE | Freq: Three times a day (TID) | ORAL | 1 refills | Status: DC | PRN

## 2019-11-20 NOTE — Progress Notes (Signed)
Subjective:  Patient ID: [name] [name], female    DOB: Apr 28, 1981  Age: 39 y.o. MRN: [id number]  CC: Follow-up (DM-Pt hasn't been checking checking blood sugars but thinks its ok//pt is having trouble breathing an has oxygen with her//no covid shot no eye exam//)  HPI She denies nay acute compliant.  DM: Controlled with diet Last hgbA1c of 6.2 No neuropathy, positive urine protein Needs to schedule appt with ophthalmology.  HTN and CHF: BP at goal Managed by Dr. Servando Snare (cardiology) Weight 6lbs weight gain in 73month She does not check weight at home. SOB with exertion (no change). Has chronic cough: non productive, no change BP Readings from Last 3 Encounters:  11/20/19 130/80  10/09/19 (!) 142/80  08/14/19 97/60   Wt Readings from Last 3 Encounters:  11/20/19 (!) 403 lb 3.2 oz (182.9 kg)  10/09/19 (!) 397 lb (180.1 kg)  08/14/19 (!) 396 lb 3.2 oz (179.7 kg)   Reviewed past Medical, Social and Family history today.  Outpatient Medications Prior to Visit  Medication Sig Dispense Refill  . amLODipine (NORVASC) 10 MG tablet Take 1 tablet (10 mg total) by mouth daily. 90 tablet 3  . carvedilol (COREG) 25 MG tablet Take 1 tablet (25 mg total) by mouth 2 (two) times daily. 180 tablet 3  . cetirizine (ZYRTEC) 10 MG tablet Take 1 tablet (10 mg total) by mouth at bedtime. 90 tablet 1  . fluticasone (FLONASE) 50 MCG/ACT nasal spray Place 2 sprays into both nostrils 2 (two) times daily. 16 g 6  . furosemide (LASIX) 40 MG tablet Take 1 tablet (40 mg total) by mouth daily. 90 tablet 3  . hydrALAZINE (APRESOLINE) 100 MG tablet TAKE 1 TABLET BY MOUTH THREE TIMES A DAY 90 tablet 9  . isosorbide dinitrate (ISORDIL) 30 MG tablet TAKE 1 TABLET BY MOUTH THREE TIMES A DAY 270 tablet 3  . lisinopril (PRINIVIL,ZESTRIL) 40 MG tablet Take 1 tablet (40 mg total) by mouth daily. 90 tablet 3  . benzonatate (TESSALON) 100 MG capsule Take 1 capsule (100 mg total) by mouth 3 (three) times daily as  needed for cough. 30 capsule 1  . omeprazole (PRILOSEC) 20 MG capsule Take 1 capsule (20 mg total) by mouth at bedtime. 14 capsule 0  . amoxicillin-clavulanate (AUGMENTIN) 875-125 MG tablet Take 1 tablet by mouth 2 (two) times daily for 7 days. (Patient not taking: Reported on 11/20/2019) 14 tablet 0   No facility-administered medications prior to visit.    ROS See HPI  Objective:  BP 130/80   Pulse 90   Temp (!) 97.1 F (36.2 C) (Tympanic)   Ht _0  (1.651 m)   Wt (!) 403 lb 3.2 oz (182.9 kg)   SpO2 99%   BMI 67.10 kg/m   BP Readings from Last 3 Encounters:  11/20/19 130/80  10/09/19 (!) 142/80  08/14/19 97/60    Wt Readings from Last 3 Encounters:  11/20/19 (!) 403 lb 3.2 oz (182.9 kg)  10/09/19 (!) 397 lb (180.1 kg)  08/14/19 (!) 396 lb 3.2 oz (179.7 kg)    Physical Exam Constitutional:      Appearance: She is obese.  Cardiovascular:     Rate and Rhythm: Normal rate and regular rhythm.     Pulses: Normal pulses.     Heart sounds: Normal heart sounds.  Pulmonary:     Effort: Pulmonary effort is normal.     Breath sounds: Normal breath sounds.  Musculoskeletal:     Right lower leg:  Edema present.     Left lower leg: Edema present.  Skin:    Findings: No erythema or rash.     Comments: Completed Dm foot exam  Neurological:     Mental Status: She is alert and oriented to person, place, and time.  Psychiatric:        Mood and Affect: Mood normal.        Behavior: Behavior normal.        Thought Content: Thought content normal.    Lab Results  Component Value Date   WBC 3.9 (L) 08/14/2019   HGB 13.4 08/14/2019   HCT 46.9 (H) 08/14/2019   PLT 194 08/14/2019   GLUCOSE 99 08/14/2019   CHOL 235 (H) 09/20/2018   TRIG 86 09/20/2018   HDL 54 09/20/2018   LDLDIRECT 99.0 09/15/2016   LDLCALC 164 (H) 09/20/2018   ALT 17 08/06/2019   AST 17 08/06/2019   NA 139 08/14/2019   K 4.1 08/14/2019   CL 92 (L) 08/14/2019   CREATININE 1.01 (H) 08/14/2019   BUN 19  08/14/2019   CO2 36 (H) 08/14/2019   TSH 2.970 09/20/2018   INR 1.27 09/18/2016   HGBA1C 6.2 09/15/2016    No results found.  Assessment & Plan:  This visit occurred during the SARS-CoV-2 public health emergency.  Safety protocols were in place, including screening questions prior to the visit, additional usage of staff PPE, and extensive cleaning of exam room while observing appropriate contact time as indicated for disinfecting solutions.   Jhene was seen today for follow-up.  Diagnoses and all orders for this visit:  CKD (chronic kidney disease) stage 4, GFR 15-29 ml/min (HCC) -     Basic metabolic panel  Hypoalbuminemia due to protein-calorie malnutrition (HCC)  Type 2 diabetes mellitus with other specified complication, without long-term current use of insulin (HCC) -     Hemoglobin A1c -     Microalbumin / creatinine urine ratio  Mixed hyperlipidemia -     Hepatic function panel -     Lipid panel  Chronic cough -     benzonatate (TESSALON) 100 MG capsule; Take 1 capsule (100 mg total) by mouth 3 (three) times daily as needed for cough.   I have discontinued Shaley S. Pekala's omeprazole and amoxicillin-clavulanate. I am also having her maintain her lisinopril, carvedilol, amLODipine, fluticasone, furosemide, hydrALAZINE, cetirizine, isosorbide dinitrate, and benzonatate.  Meds ordered this encounter  Medications  . benzonatate (TESSALON) 100 MG capsule    Sig: Take 1 capsule (100 mg total) by mouth 3 (three) times daily as needed for cough.    Dispense:  30 capsule    Refill:  1    Order Specific Question:   Supervising Provider    Answer:   Ronnald Nian [[id number]]    Problem List Items Addressed This Visit      Genitourinary   CKD (chronic kidney disease) stage 4, GFR 15-29 ml/min (HCC) - Primary   Relevant Orders   Basic metabolic panel     Other   Hypoalbuminemia due to protein-calorie malnutrition ([place])    Other Visit Diagnoses    Type 2  diabetes mellitus with other specified complication, without long-term current use of insulin ([place])       Relevant Orders   Hemoglobin A1c   Microalbumin / creatinine urine ratio   Mixed hyperlipidemia       Relevant Orders   Hepatic function panel   Lipid panel   Chronic cough  Relevant Medications   benzonatate (TESSALON) 100 MG capsule      Follow-up: Return in about 6 months (around 05/22/2020) for hyperlipidemia.  Wilfred Lacy, NP

## 2019-11-20 NOTE — Patient Instructions (Addendum)
Schedule appt with GYN for PAP, IUD check and replacement.  Go to lab for blood draw and urine collection.  Use either of these websites to schedule for COVID vaccine. [email or web address]  Schedule for Pneumonia vaccine after completion of COVID vaccine.

## 2019-11-26 ENCOUNTER — Other Ambulatory Visit: Payer: Self-pay | Admitting: Nurse Practitioner

## 2019-11-28 ENCOUNTER — Ambulatory Visit (INDEPENDENT_AMBULATORY_CARE_PROVIDER_SITE_OTHER): Payer: Medicare Other | Admitting: Adult Health

## 2019-11-28 ENCOUNTER — Other Ambulatory Visit: Payer: Self-pay

## 2019-11-28 ENCOUNTER — Encounter: Payer: Self-pay | Admitting: Adult Health

## 2019-11-28 VITALS — BP 122/80 | HR 67 | Temp 98.2°F | Ht 65.0 in | Wt >= 6400 oz

## 2019-11-28 DIAGNOSIS — J479 Bronchiectasis, uncomplicated: Secondary | ICD-10-CM | POA: Diagnosis not present

## 2019-11-28 DIAGNOSIS — J189 Pneumonia, unspecified organism: Secondary | ICD-10-CM | POA: Diagnosis not present

## 2019-11-28 DIAGNOSIS — J9612 Chronic respiratory failure with hypercapnia: Secondary | ICD-10-CM

## 2019-11-28 DIAGNOSIS — G4733 Obstructive sleep apnea (adult) (pediatric): Secondary | ICD-10-CM | POA: Diagnosis not present

## 2019-11-28 DIAGNOSIS — Z930 Tracheostomy status: Secondary | ICD-10-CM

## 2019-11-28 NOTE — Progress Notes (Signed)
Reviewed and agree with assessment/plan.   Chesley Mires, MD [facility] Pulmonary/Critical Care 08/18/2016, 12:24 PM Pager:  [phone]

## 2019-11-28 NOTE — Assessment & Plan Note (Signed)
Intolerant to CPAP.  Patient is continue with chronic trach.  Plan  Patient Instructions  Decrease Oxygen 2l/m with trach capped.  Continue with daily trach care  Please use trach collar and keep trach uncapped at bedtime (at .40% O2) .  Activity as tolerated Work on healthy weight Goal is to keep oxygen levels >88-90%. Order for smaller oxygen tanks.  Follow-up in trach clinic as planned Follow-up in 6 weeks Dr. Halford Chessman or Cherissa Hook NP and As needed

## 2019-11-28 NOTE — Patient Instructions (Addendum)
Decrease Oxygen 2l/m with trach capped.  Continue with daily trach care  Please use trach collar and keep trach uncapped at bedtime (at .40% O2) .  Activity as tolerated Work on healthy weight Goal is to keep oxygen levels >88-90%. Order for smaller oxygen tanks.  Follow-up in trach clinic as planned Follow-up in 6 weeks Dr. Halford Chessman or David Rodriquez NP and As needed

## 2019-11-28 NOTE — Assessment & Plan Note (Signed)
Continue with trach care and follow-up with trach clinic

## 2019-11-28 NOTE — Addendum Note (Signed)
Addended by: June Leap on: 11/28/2019 12:09 PM   Modules accepted: Orders

## 2019-11-28 NOTE — Assessment & Plan Note (Signed)
Acute bronchitis/pneumonia treated in December 2020.  Follow-up chest x-ray today

## 2019-11-28 NOTE — Assessment & Plan Note (Signed)
Trach.  Patient continue with her trach care.  May decrease oxygen level to 2 L.  Patient was not on oxygen prior to admission in December 2020.  On follow-up visit assess if she needs ongoing oxygen with activity.  Plan  Patient Instructions  Decrease Oxygen 2l/m with trach capped.  Continue with daily trach care  Please use trach collar and keep trach uncapped at bedtime (at .40% O2) .  Activity as tolerated Work on healthy weight Goal is to keep oxygen levels >88-90%. Order for smaller oxygen tanks.  Follow-up in trach clinic as planned Follow-up in 6 weeks Dr. Halford Chessman or Chanell Nadeau NP and As needed

## 2019-11-28 NOTE — Progress Notes (Signed)
_0  ID: [name] [name], female    DOB: [birth year], 38 y.o.   MRN: [id number]  Chief Complaint  Patient presents with  . 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  Patient has underlying obstructive sleep apnea with chronic tracheostomy.  In the past patient has worked towards possible decannulation with capped trach and using CPAP at bedtime.  However she has CPAP intolerance.  Since last visit patient says she is feeling better with less dyspnea. Was started on oxygen 24/7 after discharge in 07/2019. Currently on .40 trach collar , 6 l/m nasal cannula with capped trach.  Today in the office we checked her oxygen level at rest with trach uncapped O2 saturations greater than 90% on room air.  Trach patient requires 2 L of oxygen to keep O2 saturations greater than 88 to 90%.  Patient denies any increased cough or congestion.  She follows with the trach clinic.   Allergies  Allergen Reactions  . No Known Allergies     Immunization History  Administered Date(s) Administered  . Influenza,inj,Quad PF,6+ Mos 06/30/2016    Past Medical History:  Diagnosis Date  . Chronic diastolic heart failure ([place])    Echo 10/12: EF 50-55 // b. Echo 10/17: EF 50-55, mild BAE, PASP 42 // Echo 10/18: Mild concentric LVH, EF 55-60, normal wall motion, mild MAC, severe LAE, mild TR, PASP 15, trivial pericardial  effusion  . CKD (chronic kidney disease) stage 4, GFR 15-29 ml/min (HCC) 10/25/2016   admitted in 2/18 with SCr of 10 in the setting of dehydration from illness, etc >> improved to 5 at DC  . DM2 (diabetes mellitus, type 2) (HCC)    A1c 7.2 in 05/2016  . Gestational diabetes mellitus in pregnancy 05/24/2013  . HTN in pregnancy, chronic 05/24/2013  . Hypertensive heart disease with CHF (congestive heart failure) ([place]) 05/25/2016  . Morbid obesity ([place])   . OSA (obstructive sleep apnea) 07/09/2011   s/p Trach 2/2 prolonged respiratory failure during admx for CHF in 05/2016  . Pulmonary hypertension ([place])    secondary from CHF/OSA  . Sinus tachycardia     Tobacco History: Social History   Tobacco Use  Smoking Status Never Smoker  Smokeless Tobacco Never Used   Counseling given: Not Answered   Outpatient Medications Prior to Visit  Medication Sig Dispense Refill  . amLODipine (NORVASC) 10 MG tablet TAKE 1 TABLET BY MOUTH EVERY DAY 90 tablet 3  . benzonatate (TESSALON) 100 MG capsule Take 1 capsule (100 mg total) by mouth 3 (three) times daily as needed for cough. 30 capsule 1  . cetirizine (ZYRTEC) 10 MG tablet Take 1 tablet (10 mg total) by mouth at bedtime. 90 tablet 1  . fluticasone (FLONASE) 50 MCG/ACT nasal spray Place 2 sprays into both nostrils 2 (two) times daily. 16 g 6  . furosemide (LASIX) 40 MG tablet Take 1 tablet (40 mg total) by mouth daily.  90 tablet 3  . hydrALAZINE (APRESOLINE) 100 MG tablet TAKE 1 TABLET BY MOUTH THREE TIMES A DAY 90 tablet 9  . isosorbide dinitrate (ISORDIL) 30 MG tablet TAKE 1 TABLET BY MOUTH THREE TIMES A DAY 270 tablet 3  . lisinopril (ZESTRIL) 40 MG tablet TAKE 1 TABLET BY MOUTH EVERY DAY 90 tablet 3  . carvedilol (COREG) 25 MG tablet Take 1 tablet (25 mg total) by mouth 2 (two) times daily. 180 tablet 3   No facility-administered medications prior to visit.     Review of Systems:   Constitutional:   No  weight loss, night sweats,  Fevers,  chills, fatigue, or  lassitude.  HEENT:   No headaches,  Difficulty swallowing,  Tooth/dental problems, or  Sore throat,                No sneezing, itching, ear ache, nasal congestion, post nasal drip,   CV:  No chest pain,  Orthopnea, PND, swelling in lower extremities, anasarca, dizziness, palpitations, syncope.   GI  No heartburn, indigestion, abdominal pain, nausea, vomiting, diarrhea, change in bowel habits, loss of appetite, bloody stools.   Resp: No shortness of breath with exertion or at rest.  No excess mucus, no productive cough,  No non-productive cough,  No coughing up of blood.  No change in color of mucus.  No wheezing.  No chest wall deformity  Skin: no rash or lesions.  GU: no dysuria, change in color of urine, no urgency or frequency.  No flank pain, no hematuria   MS:  No joint pain or swelling.  No decreased range of motion.  No back pain.    Physical Exam  BP 122/80 (BP Location: Left Wrist, Patient Position: Sitting, Cuff Size: Large)   Pulse 67   Temp 98.2 F (36.8 C) (Temporal)   Ht 5' 5" (1.651 m)   Wt (!) 403 lb (182.8 kg)   SpO2 100% Comment: o2 6l/min  BMI 67.06 kg/m   GEN: A/Ox3; pleasant , NAD, BMI 67    HEENT:  [place]/AT,    NOSE-clear, THROAT-clear, no lesions, no postnasal drip or exudate noted.  Trach midline , dressing clean and dry .   NECK:  Supple w/ fair ROM; no JVD; normal carotid impulses w/o bruits; no thyromegaly or nodules palpated; no lymphadenopathy.    RESP  Clear  P & A; w/o, wheezes/ rales/ or rhonchi. no accessory muscle use, no dullness to percussion  CARD:  RRR, no m/r/g, 1+  peripheral edema, pulses intact, no cyanosis or clubbing. Stasis dermatatic changes.   GI:   Soft & nt; nml bowel sounds; no organomegaly or masses detected.   Musco: Warm bil, no deformities or joint swelling noted.   Neuro: alert, no focal deficits noted.    Skin: Warm, no lesions or rashes    Lab Results:  CBC    Component Value Date/Time    WBC 3.9 (L) 08/14/2019 0343   RBC 4.96 08/14/2019 0343   HGB 13.4 08/14/2019 0343   HGB 13.7 09/20/2018 1134   HCT 46.9 (H) 08/14/2019 0343   HCT 41.8 09/20/2018 1134   PLT 194 08/14/2019 0343   PLT 250 09/20/2018 1134   MCV 94.6 08/14/2019 0343   MCV 88 09/20/2018 1134   MCH 27.0 08/14/2019 0343   MCHC 28.6 (L) 08/14/2019 0343   RDW 17.3 (H) 08/14/2019 0343   RDW 14.4 09/20/2018 1134   LYMPHSABS 0.6 (L) 08/06/2019 1833   MONOABS 0.5 08/06/2019 1833  EOSABS 0.2 08/06/2019 1833   BASOSABS 0.0 08/06/2019 1833    BMET    Component Value Date/Time   NA 139 11/20/2019 1020   NA 139 09/20/2018 1134   K 4.5 11/20/2019 1020   CL 98 11/20/2019 1020   CO2 36 (H) 11/20/2019 1020   GLUCOSE 127 (H) 11/20/2019 1020   BUN 16 11/20/2019 1020   BUN 24 (H) 09/20/2018 1134   CREATININE 0.98 11/20/2019 1020   CALCIUM 9.1 11/20/2019 1020   GFRNONAA >60 08/14/2019 0343   GFRAA >60 08/14/2019 0343    BNP    Component Value Date/Time   BNP 682.6 (H) 08/06/2019 1824    ProBNP No results found for: PROBNP  Imaging: No results found.    No flowsheet data found.  No results found for: NITRICOXIDE      Assessment & Plan:   OSA (obstructive sleep apnea) Intolerant to CPAP.  Patient is continue with chronic trach.  Plan  Patient Instructions  Decrease Oxygen 2l/m with trach capped.  Continue with daily trach care  Please use trach collar and keep trach uncapped at bedtime (at .40% O2) .  Activity as tolerated Work on healthy weight Goal is to keep oxygen levels >88-90%. Order for smaller oxygen tanks.  Follow-up in trach clinic as planned Follow-up in 6 weeks Dr. Halford Chessman or Dayle Mcnerney NP and As needed        HCAP (healthcare-associated pneumonia) Acute bronchitis/pneumonia treated in December 2020.  Follow-up chest x-ray today  Chronic respiratory failure with hypercapnia ([place]) Trach.  Patient continue with her trach care.  May decrease oxygen level to 2 L.  Patient  was not on oxygen prior to admission in December 2020.  On follow-up visit assess if she needs ongoing oxygen with activity.  Plan  Patient Instructions  Decrease Oxygen 2l/m with trach capped.  Continue with daily trach care  Please use trach collar and keep trach uncapped at bedtime (at .40% O2) .  Activity as tolerated Work on healthy weight Goal is to keep oxygen levels >88-90%. Order for smaller oxygen tanks.  Follow-up in trach clinic as planned Follow-up in 6 weeks Dr. 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

## 2019-12-12 ENCOUNTER — Other Ambulatory Visit: Payer: Self-pay | Admitting: Nurse Practitioner

## 2019-12-12 MED ORDER — CARVEDILOL 25 MG PO TABS: 25.0000 mg | ORAL_TABLET | Freq: Two times a day (BID) | ORAL | 3 refills | Status: DC

## 2019-12-12 NOTE — Telephone Encounter (Signed)
Pt's medication was sent to pt's pharmacy as requested. Confirmation received.

## 2019-12-17 ENCOUNTER — Other Ambulatory Visit (HOSPITAL_COMMUNITY)
Admission: RE | Admit: 2019-12-17 | Discharge: 2019-12-17 | Disposition: A | Discharge status: Home / Self Care | Payer: Medicare Other | Source: Ambulatory Visit | Attending: Adult Health | Admitting: Adult Health

## 2019-12-17 DIAGNOSIS — Z01812 Encounter for preprocedural laboratory examination: Secondary | ICD-10-CM | POA: Insufficient documentation

## 2019-12-17 DIAGNOSIS — Z20822 Contact with and (suspected) exposure to covid-19: Secondary | ICD-10-CM | POA: Insufficient documentation

## 2019-12-17 LAB — SARS CORONAVIRUS 2 (TAT 6-24 HRS): SARS Coronavirus 2: NEGATIVE

## 2019-12-19 ENCOUNTER — Ambulatory Visit (HOSPITAL_COMMUNITY): Payer: Medicare Other

## 2019-12-19 ENCOUNTER — Other Ambulatory Visit: Payer: Self-pay

## 2019-12-19 ENCOUNTER — Ambulatory Visit (HOSPITAL_COMMUNITY)
Admission: RE | Admit: 2019-12-19 | Discharge: 2019-12-19 | Disposition: A | Discharge status: Home / Self Care | Payer: Medicare Other | Source: Ambulatory Visit | Attending: Acute Care | Admitting: Acute Care

## 2019-12-19 DIAGNOSIS — Z930 Tracheostomy status: Secondary | ICD-10-CM | POA: Diagnosis not present

## 2019-12-19 DIAGNOSIS — I5032 Chronic diastolic (congestive) heart failure: Secondary | ICD-10-CM | POA: Diagnosis not present

## 2019-12-19 DIAGNOSIS — J9611 Chronic respiratory failure with hypoxia: Secondary | ICD-10-CM | POA: Insufficient documentation

## 2019-12-19 DIAGNOSIS — J9612 Chronic respiratory failure with hypercapnia: Secondary | ICD-10-CM | POA: Diagnosis not present

## 2019-12-19 DIAGNOSIS — G4733 Obstructive sleep apnea (adult) (pediatric): Secondary | ICD-10-CM

## 2019-12-19 DIAGNOSIS — Z430 Encounter for attention to tracheostomy: Secondary | ICD-10-CM | POA: Diagnosis not present

## 2019-12-19 NOTE — Progress Notes (Signed)
Tracheostomy Procedure Note  [name] [name] [id number] 1980-11-22  Pre Procedure Tracheostomy Information  Trach Brand: Portex Size: 6.0 Style: Uncuffed Secured by: Velcro   Procedure: Trach change and trach cleaning    Post Procedure Tracheostomy Information  Trach Brand: Shiley Size: 6.0 Style: Uncuffed Secured by: Velcro   Post Procedure Evaluation:  ETCO2 positive color change from yellow to purple : Yes.   Vital signs: VSS, pulse 92, respirations 18 and pulse oximetry 97 % on 2LPM via [place] tach capped  Patients current condition: stable Complications: No apparent complications Trach site exam: clean and dry Wound care done: 4 x 4 gauze (drain)  applied Patient did tolerate procedure well.   Education: Oxygen device  venti mask with trach collar set up  Prescription needs: none    Additional needs: Extra red caps given to patient along with a new PMV and an additional venti mask/ trach collar set up

## 2019-12-19 NOTE — Progress Notes (Signed)
[facility]   Reason for visit:  Planned trach care  HPI:  Well known to me. Trach dependent 2/2 OSA/OHS. Also has sig HFpEF, and cor pulmonale from chronic hypoxia. She is slowly recovering from her hospitalization this last winter. Now weaning oxygen, feels her activity tolerance is improving and having no issues since we moved her back to her regular cuffless trach. She does report her typical vocal hoarseness which is typical for her as she gets closer to the 5 week mark.  ROS  Review of Systems - History obtained from the patient General ROS: negative Psychological ROS: negative ENT ROS: raspy voice.  Allergy and Immunology ROS: negative Endocrine ROS: negative Respiratory ROS: no cough, shortness of breath, or wheezing Cardiovascular ROS: no chest pain or dyspnea on exertion Gastrointestinal ROS: no abdominal pain, change in bowel habits, or black or bloody stools Musculoskeletal ROS: negative Neurological ROS: no TIA or stroke symptoms  Vital signs:  Reviewed  Exam:  Physical Exam Constitutional:      General: She is not in acute distress.    Appearance: Normal appearance. She is not toxic-appearing.  HENT:     Head: Normocephalic and atraumatic.     Nose: Nose normal.     Mouth/Throat:     Mouth: Mucous membranes are moist.     Pharynx: No oropharyngeal exudate.  Eyes:     Pupils: Pupils are equal, round, and reactive to light.  Neck:     Comments: Trach stoma unremarkable  Cardiovascular:     Rate and Rhythm: Normal rate and regular rhythm.  Pulmonary:     Effort: Pulmonary effort is normal.     Breath sounds: Normal breath sounds.  Abdominal:     General: Bowel sounds are normal.     Palpations: Abdomen is soft.  Musculoskeletal:        General: Normal range of motion.     Cervical back: Normal range of motion.  Skin:    General: Skin is warm.     Capillary Refill: Capillary refill takes less than 2 seconds.  Neurological:   General: No focal deficit present.     Mental Status: She is alert.  Psychiatric:        Mood and Affect: Mood normal.     Trach change/procedure:   #6 portex cuffless bivona trach replaced w/out difficulty    Impression/dx  Chronic hypoxic respiratory failure  OSA  OHS HFpEF  Discussion  [name] [name] is doing well from trach stand-point and overall seems to be returning to baseline. She is hopefull that she may come off oxygen at some point. We would need to see how she does w/ walking oximetry before we can entertain this. I do not think we are at a point where we should consider re-trying CPAP Plan  Cont routine trach care w/ trach change every 4-5 weeks Oxygen 2 liters when trach capped and 31% (4 liters) via ATC.   Visit time: 32 minutes.   Erick Colace ACNP-BC [place]

## 2019-12-26 ENCOUNTER — Other Ambulatory Visit: Payer: Self-pay | Admitting: Nurse Practitioner

## 2019-12-26 NOTE — Telephone Encounter (Signed)
Pt's medication was sent to pt's pharmacy as requested. Confirmation received.

## 2020-01-09 ENCOUNTER — Ambulatory Visit: Payer: Medicare Other | Admitting: Adult Health

## 2020-01-23 NOTE — Progress Notes (Signed)
CARDIOLOGY OFFICE NOTE  Date:  02/06/2020    [name] [name] Date of Birth: 1980-10-22 Medical Record #[id number]  PCP:  Flossie Buffy, NP  Cardiologist:  Marisa Cyphers   Chief Complaint  Patient presents with  . Follow-up    History of Present Illness: [name] [name] is a 39 y.o. female who presents today for a 4 month check.  Seen for Dr. Marlou Porch. She has primarily followed with me over the past several years.   She has a hx of HTN, OSA,morbidobesity,&gestational diabetes. She was admitted in 10/17 respiratory failure in the setting of HTN emergency with pulmonary edema and OHS/OSA requiring intubation. She ultimately underwent tracheostomy due to prolonged respiratory failure. She did have prolonged sinus pauses during coughing and straining against the ventilator. This was reviewed with EP and she was not felt to need a PPM as her pauses were likely related to underlying OSA/OHS. Echocardiogram did demonstrate pulmonary hypertension with PASP 42 and normal ejection fraction.She was admitted in 1/18 with acute renal failure in the setting of dehydration. She has been followed in our hypertension clinic to help with control of blood pressure.   Last seen by Dr. Marlou Porch 6/18.  Unfortunately has continued to gain weight - now over 400#. Loves sugar. Was beginning to consider bariatric surgery - tried to get her to Dr. Hassell Done here in town. She was admitted mid December with acute hypercarbic respiratory failure after attempted trach downsize. In anticipation of the trach change, she had a COVID test - found to have a sat of 60%. Plan had been for gradual downsizing of trach with goal of removal. The ER attempted to change her trach - was unable to pass - ended up being intubated. Noted "stenotic and small vocal cords". There was concern for possible PE - not noted on VQ scan - did not end up on anticoagulation. New trach placed on 12/15.  She was weaned from  ventilator and on ATC 40% with sats 98-100%. She was d/c'd with continuous oxygen to wean at home.    I then saw her in February - still with trach collar. No plan to downsize her trach or discuss removal - probably going to be lifelong now. Was still fairly mobile.   The patient does not have symptoms concerning for COVID-19 infection (fever, chills, cough, or new shortness of breath).   Comes in today. Here alone Now on 2 liters of oxygen. She says there is plans to try and have this oxygen discontinued. No plans for trach removal. Trying to "build back up". Feels ok. Pretty mobile. Weight continues to climb. She admits she has not addressed her food issues. Her sister is getting married in 2023 - wants [name] as her maid of honor. Daughter is growing up. Still ok with her mobility and overall feels well. She knows she needs to make changes. She has been vaccinated.   Past Medical History:  Diagnosis Date  . Chronic diastolic heart failure ([place])    Echo 10/12: EF 50-55 // b. Echo 10/17: EF 50-55, mild BAE, PASP 42 // Echo 10/18: Mild concentric LVH, EF 55-60, normal wall motion, mild MAC, severe LAE, mild TR, PASP 15, trivial pericardial effusion  . CKD (chronic kidney disease) stage 4, GFR 15-29 ml/min (HCC) 10/25/2016   admitted in 2/18 with SCr of 10 in the setting of dehydration from illness, etc >> improved to 5 at DC  . DM2 (diabetes mellitus, type 2) ([place])  A1c 7.2 in 05/2016  . Gestational diabetes mellitus in pregnancy 05/24/2013  . HTN in pregnancy, chronic 05/24/2013  . Hypertensive heart disease with CHF (congestive heart failure) ([place]) 05/25/2016  . Morbid obesity ([place])   . OSA (obstructive sleep apnea) 07/09/2011   s/p Trach 2/2 prolonged respiratory failure during admx for CHF in 05/2016  . Pulmonary hypertension ([place])    secondary from CHF/OSA  . Sinus tachycardia     Past Surgical History:  Procedure Laterality Date  . TRACHEOSTOMY TUBE PLACEMENT N/A 06/11/2016    Procedure: TRACHEOSTOMY;  Surgeon: Melida Quitter, MD;  Location: [facility];  Service: ENT;  Laterality: N/A;     Medications: Current Meds  Medication Sig  . amLODipine (NORVASC) 10 MG tablet TAKE 1 TABLET BY MOUTH EVERY DAY  . benzonatate (TESSALON) 100 MG capsule Take 1 capsule (100 mg total) by mouth 3 (three) times daily as needed for cough.  . carvedilol (COREG) 25 MG tablet Take 1 tablet (25 mg total) by mouth 2 (two) times daily.  . cetirizine (ZYRTEC) 10 MG tablet Take 1 tablet (10 mg total) by mouth at bedtime.  . fluticasone (FLONASE) 50 MCG/ACT nasal spray Place 2 sprays into both nostrils 2 (two) times daily.  . furosemide (LASIX) 40 MG tablet TAKE 1 TABLET BY MOUTH EVERY DAY  . hydrALAZINE (APRESOLINE) 100 MG tablet TAKE 1 TABLET BY MOUTH THREE TIMES A DAY  . isosorbide dinitrate (ISORDIL) 30 MG tablet TAKE 1 TABLET BY MOUTH THREE TIMES A DAY  . lisinopril (ZESTRIL) 40 MG tablet TAKE 1 TABLET BY MOUTH EVERY DAY     Allergies: Allergies  Allergen Reactions  . No Known Allergies     Social History: The patient  reports that she has never smoked. She has never used smokeless tobacco. She reports that she does not drink alcohol and does not use drugs.   Family History: The patient's family history includes Birth defects in her paternal grandfather; Diabetes in an other family member; Hypertension in her mother.   Review of Systems: Please see the history of present illness.   All other systems are reviewed and negative.   Physical Exam: VS:  BP 122/80   Pulse 85   Ht _0  (1.651 m)   Wt (!) 416 lb (188.7 kg)   SpO2 91%   BMI 69.23 kg/m  .  BMI Body mass index is 69.23 kg/m.  Wt Readings from Last 3 Encounters:  02/06/20 (!) 416 lb (188.7 kg)  11/28/19 (!) 403 lb (182.8 kg)  11/20/19 (!) 403 lb 3.2 oz (182.9 kg)    General: Pleasant. Morbidly obese. Alert and in no acute distress. Voice is hoarse.    Cardiac: Heart tones are distant. Legs are full with no  significant edema.  Respiratory:  Decreased breath sounds due to body habitus. Trach collar in place.   GI: Soft and nontender.  MS: No deformity or atrophy. Gait and ROM intact.  Skin: Warm and dry. Color is normal.  Neuro:  Strength and sensation are intact and no gross focal deficits noted.  Psych: Alert, appropriate and with normal affect.   LABORATORY DATA:  EKG:  EKG is not ordered today.   Lab Results  Component Value Date   WBC 3.9 (L) 08/14/2019   HGB 13.4 08/14/2019   HCT 46.9 (H) 08/14/2019   PLT 194 08/14/2019   GLUCOSE 127 (H) 11/20/2019   CHOL 213 (H) 11/20/2019   TRIG 125.0 11/20/2019   HDL 43.20 11/20/2019  LDLDIRECT 99.0 09/15/2016   LDLCALC 144 (H) 11/20/2019   ALT 12 11/20/2019   AST 12 11/20/2019   NA 139 11/20/2019   K 4.5 11/20/2019   CL 98 11/20/2019   CREATININE 0.98 11/20/2019   BUN 16 11/20/2019   CO2 36 (H) 11/20/2019   TSH 2.970 09/20/2018   INR 1.27 09/18/2016   HGBA1C 6.2 11/20/2019   MICROALBUR <0.7 11/20/2019     BNP (last 3 results) Recent Labs    08/06/19 1824  BNP 682.6*    ProBNP (last 3 results) No results for input(s): PROBNP in the last 8760 hours.   Other Studies Reviewed Today:  LE Venous Doppler Summary 07/2019:  Right: There is no evidence of deep vein thrombosis in the lower  extremity. However, portions of this examination were limited- see  technologist comments above. No cystic structure found in the popliteal  fossa.  Left: There is no evidence of deep vein thrombosis in the lower extremity.  However, portions of this examination were limited- see technologist  comments above. No cystic structure found in the popliteal fossa.    *See table(s) above for measurements and observations.   Electronically signed by Deitra Mayo MD on 08/07/2019 at 7:58:22  PM.    ECHO IMPRESSIONS 07/2019  1. Left ventricular ejection fraction, by visual estimation, is 60 to  65%. The left ventricle has normal  function. There is no left ventricular  hypertrophy.  2. The left ventricle has no regional wall motion abnormalities.  3. Global right ventricle has normal systolic function.The right  ventricular size is normal. No increase in right ventricular wall  thickness.  4. Left atrial size was mildly dilated.  5. Right atrial size was normal.  6. Trivial pericardial effusion is present.  7. The mitral valve is normal in structure. No evidence of mitral valve  regurgitation. No evidence of mitral stenosis.  8. The tricuspid valve is normal in structure. Tricuspid valve  regurgitation is not demonstrated.  9. The aortic valve is normal in structure. Aortic valve regurgitation is  not visualized. No evidence of aortic valve sclerosis or stenosis.  10. The pulmonic valve was normal in structure. Pulmonic valve  regurgitation is not visualized.  11. TR signal is inadequate for assessing pulmonary artery systolic  pressure.  12. The inferior vena cava is dilated in size with <50% respiratory  variability, suggesting right atrial pressure of 15 mmHg.    ASSESSMENT & PLAN:    1. HTN - BP ok on her current regimen - no changes made today.   2. Prior hypercarbic respiratory failure - in the setting of trach change/downsize - very tenuous situation.   3. Chronic diastolic HF - she feels she is at her baseline. Has good Bp control.   4. Morbid obesity - remains the crux of her issues - this is what we spent our time talking about - she has filled out some paperwork for the bariatric center - encouraged her to continue the process just to get to the counseling which will be crucial for long term success.   5. Pulmonary HTN - multifactorial from obesity, cor pulmonale, diastolic HF, etc. Her options are very limited without weight loss.   6. Chronic RBBB  Current medicines are reviewed with the patient today.  The patient does not have concerns regarding medicines other than what has  been noted above.  The following changes have been made:  See above.  Labs/ tests ordered today include:   No orders  of the defined types were placed in this encounter.    Disposition:   FU with me in 4 months.   Patient is agreeable to this plan and will call if any problems develop in the interim.   SignedTruitt Merle, NP  02/06/2020 3:13 PM  [facility] [address] [place], [place]  [address] Phone: [phone] Fax: [phone]

## 2020-02-02 ENCOUNTER — Other Ambulatory Visit: Payer: Self-pay | Admitting: Nurse Practitioner

## 2020-02-02 DIAGNOSIS — I10 Essential (primary) hypertension: Secondary | ICD-10-CM

## 2020-02-06 ENCOUNTER — Ambulatory Visit (INDEPENDENT_AMBULATORY_CARE_PROVIDER_SITE_OTHER): Payer: Medicare Other | Admitting: Nurse Practitioner

## 2020-02-06 ENCOUNTER — Other Ambulatory Visit: Payer: Self-pay

## 2020-02-06 ENCOUNTER — Encounter: Payer: Self-pay | Admitting: Nurse Practitioner

## 2020-02-06 VITALS — BP 122/80 | HR 85 | Ht 65.0 in | Wt >= 6400 oz

## 2020-02-06 DIAGNOSIS — I272 Pulmonary hypertension, unspecified: Secondary | ICD-10-CM | POA: Diagnosis not present

## 2020-02-06 DIAGNOSIS — I5032 Chronic diastolic (congestive) heart failure: Secondary | ICD-10-CM | POA: Diagnosis not present

## 2020-02-06 DIAGNOSIS — I110 Hypertensive heart disease with heart failure: Secondary | ICD-10-CM

## 2020-02-06 NOTE — Patient Instructions (Addendum)
After Visit Summary:  We will be checking the following labs today - NONE   Medication Instructions:    Continue with your current medicines.    If you need a refill on your cardiac medications before your next appointment, please call your pharmacy.     Testing/Procedures To Be Arranged:  N/A  Follow-Up:   See me in about 4 months.   Keep working with the [facility], you and your health needs are our priority.  As part of our continuing mission to provide you with exceptional heart care, we have created designated Provider Care Teams.  These Care Teams include your primary Cardiologist (physician) and Advanced Practice Providers (APPs -  Physician Assistants and Nurse Practitioners) who all work together to provide you with the care you need, when you need it.  Special Instructions:  . Stay safe, wash your hands for at least 20 seconds and wear a mask when needed.  . It was good to talk with you today.    Call the [facility] office at [phone] if you have any questions, problems or concerns.

## 2020-02-07 ENCOUNTER — Telehealth: Payer: Self-pay | Admitting: Nurse Practitioner

## 2020-02-07 NOTE — Telephone Encounter (Signed)
Agree.  [name] [name]

## 2020-02-07 NOTE — Telephone Encounter (Signed)
Returned call to patient who called to request that Truitt Merle, NP prescribe something for her legs. States there is an area of what she thinks looks like blisters on her ankle, they are weeping but not painful. She is unsure how long ago this started. I advised her that I will route message to Cecille Rubin but that she should reach out to her PCP for advice because Cecille Rubin is out of the office until Monday. I advised her to elevate her legs as high as possible which will benefit swelling and maybe reduce any weeping. She has not worn compression stockings. She verbalized understanding and agreement with plan to reach out to PCP and she asked that I send the message also to [facility] which I agreed to do. She thanked me for the call.

## 2020-02-07 NOTE — Telephone Encounter (Signed)
New Message   Pt is calling and is wanting to speak with Truitt Merle.  She says she forgot to mention when she was here, but she has a rash on her ankle, she says it is like a blister.  She is wondering if there is anything she can give for the rash    Please call

## 2020-02-12 ENCOUNTER — Other Ambulatory Visit (HOSPITAL_COMMUNITY): Payer: Medicare Other

## 2020-02-13 ENCOUNTER — Inpatient Hospital Stay (HOSPITAL_COMMUNITY): Admission: RE | Admit: 2020-02-13 | Payer: Medicare Other | Source: Ambulatory Visit

## 2020-02-17 ENCOUNTER — Inpatient Hospital Stay (HOSPITAL_COMMUNITY)
Admission: AD | Admit: 2020-02-17 | Discharge: 2020-02-27 | DRG: 004 | Disposition: A | Discharge status: Home Health | Payer: Medicare Other | Attending: Family Medicine | Admitting: Family Medicine

## 2020-02-17 ENCOUNTER — Encounter (HOSPITAL_COMMUNITY): Payer: Self-pay

## 2020-02-17 DIAGNOSIS — E119 Type 2 diabetes mellitus without complications: Secondary | ICD-10-CM

## 2020-02-17 DIAGNOSIS — I130 Hypertensive heart and chronic kidney disease with heart failure and stage 1 through stage 4 chronic kidney disease, or unspecified chronic kidney disease: Secondary | ICD-10-CM | POA: Diagnosis present

## 2020-02-17 DIAGNOSIS — J9690 Respiratory failure, unspecified, unspecified whether with hypoxia or hypercapnia: Secondary | ICD-10-CM | POA: Diagnosis not present

## 2020-02-17 DIAGNOSIS — N184 Chronic kidney disease, stage 4 (severe): Secondary | ICD-10-CM | POA: Diagnosis present

## 2020-02-17 DIAGNOSIS — N179 Acute kidney failure, unspecified: Secondary | ICD-10-CM | POA: Diagnosis present

## 2020-02-17 DIAGNOSIS — L03116 Cellulitis of left lower limb: Secondary | ICD-10-CM | POA: Diagnosis present

## 2020-02-17 DIAGNOSIS — I152 Hypertension secondary to endocrine disorders: Secondary | ICD-10-CM | POA: Diagnosis present

## 2020-02-17 DIAGNOSIS — R5381 Other malaise: Secondary | ICD-10-CM | POA: Diagnosis present

## 2020-02-17 DIAGNOSIS — J9621 Acute and chronic respiratory failure with hypoxia: Secondary | ICD-10-CM | POA: Diagnosis present

## 2020-02-17 DIAGNOSIS — E1159 Type 2 diabetes mellitus with other circulatory complications: Secondary | ICD-10-CM | POA: Diagnosis present

## 2020-02-17 DIAGNOSIS — Z930 Tracheostomy status: Secondary | ICD-10-CM

## 2020-02-17 DIAGNOSIS — R6520 Severe sepsis without septic shock: Secondary | ICD-10-CM | POA: Diagnosis present

## 2020-02-17 DIAGNOSIS — I872 Venous insufficiency (chronic) (peripheral): Secondary | ICD-10-CM | POA: Diagnosis present

## 2020-02-17 DIAGNOSIS — Z8616 Personal history of COVID-19: Secondary | ICD-10-CM

## 2020-02-17 DIAGNOSIS — Z430 Encounter for attention to tracheostomy: Secondary | ICD-10-CM

## 2020-02-17 DIAGNOSIS — E1165 Type 2 diabetes mellitus with hyperglycemia: Secondary | ICD-10-CM | POA: Diagnosis not present

## 2020-02-17 DIAGNOSIS — L0390 Cellulitis, unspecified: Secondary | ICD-10-CM | POA: Diagnosis present

## 2020-02-17 DIAGNOSIS — J9601 Acute respiratory failure with hypoxia: Secondary | ICD-10-CM

## 2020-02-17 DIAGNOSIS — I2781 Cor pulmonale (chronic): Secondary | ICD-10-CM | POA: Diagnosis present

## 2020-02-17 DIAGNOSIS — I5033 Acute on chronic diastolic (congestive) heart failure: Secondary | ICD-10-CM | POA: Diagnosis present

## 2020-02-17 DIAGNOSIS — J9622 Acute and chronic respiratory failure with hypercapnia: Secondary | ICD-10-CM | POA: Diagnosis present

## 2020-02-17 DIAGNOSIS — G9341 Metabolic encephalopathy: Secondary | ICD-10-CM | POA: Diagnosis not present

## 2020-02-17 DIAGNOSIS — R0902 Hypoxemia: Secondary | ICD-10-CM

## 2020-02-17 DIAGNOSIS — A419 Sepsis, unspecified organism: Secondary | ICD-10-CM | POA: Diagnosis not present

## 2020-02-17 DIAGNOSIS — I2729 Other secondary pulmonary hypertension: Secondary | ICD-10-CM | POA: Diagnosis present

## 2020-02-17 DIAGNOSIS — Z9289 Personal history of other medical treatment: Secondary | ICD-10-CM

## 2020-02-17 DIAGNOSIS — D6959 Other secondary thrombocytopenia: Secondary | ICD-10-CM | POA: Diagnosis not present

## 2020-02-17 DIAGNOSIS — Z8249 Family history of ischemic heart disease and other diseases of the circulatory system: Secondary | ICD-10-CM

## 2020-02-17 DIAGNOSIS — I5032 Chronic diastolic (congestive) heart failure: Secondary | ICD-10-CM | POA: Diagnosis present

## 2020-02-17 DIAGNOSIS — Z833 Family history of diabetes mellitus: Secondary | ICD-10-CM

## 2020-02-17 DIAGNOSIS — I890 Lymphedema, not elsewhere classified: Secondary | ICD-10-CM | POA: Diagnosis present

## 2020-02-17 DIAGNOSIS — J159 Unspecified bacterial pneumonia: Secondary | ICD-10-CM | POA: Diagnosis present

## 2020-02-17 DIAGNOSIS — N183 Chronic kidney disease, stage 3 unspecified: Secondary | ICD-10-CM | POA: Diagnosis present

## 2020-02-17 DIAGNOSIS — Z978 Presence of other specified devices: Secondary | ICD-10-CM

## 2020-02-17 DIAGNOSIS — I5030 Unspecified diastolic (congestive) heart failure: Secondary | ICD-10-CM | POA: Diagnosis present

## 2020-02-17 DIAGNOSIS — E1122 Type 2 diabetes mellitus with diabetic chronic kidney disease: Secondary | ICD-10-CM | POA: Diagnosis present

## 2020-02-17 DIAGNOSIS — G4733 Obstructive sleep apnea (adult) (pediatric): Secondary | ICD-10-CM | POA: Diagnosis present

## 2020-02-17 DIAGNOSIS — Z6841 Body Mass Index (BMI) 40.0 and over, adult: Secondary | ICD-10-CM

## 2020-02-17 DIAGNOSIS — E876 Hypokalemia: Secondary | ICD-10-CM | POA: Diagnosis not present

## 2020-02-17 DIAGNOSIS — J9602 Acute respiratory failure with hypercapnia: Secondary | ICD-10-CM | POA: Diagnosis present

## 2020-02-17 NOTE — ED Triage Notes (Addendum)
Pt presents for trach collar to be changed out.  Has appt with trach clinic next week but feels she can't wait until then.  Has noticed voice change and increase in shortness of breath. Pt is on oxygen via [place] @ 4L.  Pt is able to suction trach and clear secretions.   Onset 1 week left lower leg swelling and weeping fluid.  Pt also needs the end cap to trach, she lost it while in the ED.

## 2020-02-17 NOTE — ED Notes (Signed)
Pt noted to have weeping, open rash to LLE

## 2020-02-18 ENCOUNTER — Emergency Department (HOSPITAL_COMMUNITY): Payer: Medicare Other

## 2020-02-18 ENCOUNTER — Other Ambulatory Visit: Payer: Self-pay

## 2020-02-18 ENCOUNTER — Inpatient Hospital Stay (HOSPITAL_COMMUNITY): Payer: Medicare Other

## 2020-02-18 DIAGNOSIS — I872 Venous insufficiency (chronic) (peripheral): Secondary | ICD-10-CM | POA: Diagnosis present

## 2020-02-18 DIAGNOSIS — G9341 Metabolic encephalopathy: Secondary | ICD-10-CM | POA: Diagnosis not present

## 2020-02-18 DIAGNOSIS — L0390 Cellulitis, unspecified: Secondary | ICD-10-CM | POA: Diagnosis present

## 2020-02-18 DIAGNOSIS — R6520 Severe sepsis without septic shock: Secondary | ICD-10-CM | POA: Diagnosis present

## 2020-02-18 DIAGNOSIS — J9621 Acute and chronic respiratory failure with hypoxia: Secondary | ICD-10-CM | POA: Insufficient documentation

## 2020-02-18 DIAGNOSIS — R609 Edema, unspecified: Secondary | ICD-10-CM | POA: Diagnosis not present

## 2020-02-18 DIAGNOSIS — J9690 Respiratory failure, unspecified, unspecified whether with hypoxia or hypercapnia: Secondary | ICD-10-CM | POA: Diagnosis present

## 2020-02-18 DIAGNOSIS — Z6841 Body Mass Index (BMI) 40.0 and over, adult: Secondary | ICD-10-CM | POA: Diagnosis not present

## 2020-02-18 DIAGNOSIS — I2729 Other secondary pulmonary hypertension: Secondary | ICD-10-CM | POA: Diagnosis present

## 2020-02-18 DIAGNOSIS — Z930 Tracheostomy status: Secondary | ICD-10-CM | POA: Diagnosis not present

## 2020-02-18 DIAGNOSIS — J9601 Acute respiratory failure with hypoxia: Secondary | ICD-10-CM | POA: Diagnosis present

## 2020-02-18 DIAGNOSIS — Z8616 Personal history of COVID-19: Secondary | ICD-10-CM | POA: Diagnosis not present

## 2020-02-18 DIAGNOSIS — G4733 Obstructive sleep apnea (adult) (pediatric): Secondary | ICD-10-CM | POA: Diagnosis present

## 2020-02-18 DIAGNOSIS — E1122 Type 2 diabetes mellitus with diabetic chronic kidney disease: Secondary | ICD-10-CM | POA: Diagnosis present

## 2020-02-18 DIAGNOSIS — J9602 Acute respiratory failure with hypercapnia: Secondary | ICD-10-CM | POA: Diagnosis not present

## 2020-02-18 DIAGNOSIS — N184 Chronic kidney disease, stage 4 (severe): Secondary | ICD-10-CM | POA: Diagnosis present

## 2020-02-18 DIAGNOSIS — I5033 Acute on chronic diastolic (congestive) heart failure: Secondary | ICD-10-CM | POA: Diagnosis present

## 2020-02-18 DIAGNOSIS — I2781 Cor pulmonale (chronic): Secondary | ICD-10-CM | POA: Diagnosis present

## 2020-02-18 DIAGNOSIS — J9622 Acute and chronic respiratory failure with hypercapnia: Secondary | ICD-10-CM | POA: Diagnosis present

## 2020-02-18 DIAGNOSIS — E876 Hypokalemia: Secondary | ICD-10-CM | POA: Diagnosis not present

## 2020-02-18 DIAGNOSIS — R5381 Other malaise: Secondary | ICD-10-CM | POA: Diagnosis present

## 2020-02-18 DIAGNOSIS — L03116 Cellulitis of left lower limb: Secondary | ICD-10-CM | POA: Diagnosis present

## 2020-02-18 DIAGNOSIS — I130 Hypertensive heart and chronic kidney disease with heart failure and stage 1 through stage 4 chronic kidney disease, or unspecified chronic kidney disease: Secondary | ICD-10-CM | POA: Diagnosis present

## 2020-02-18 DIAGNOSIS — E1165 Type 2 diabetes mellitus with hyperglycemia: Secondary | ICD-10-CM | POA: Diagnosis not present

## 2020-02-18 DIAGNOSIS — D6959 Other secondary thrombocytopenia: Secondary | ICD-10-CM | POA: Diagnosis not present

## 2020-02-18 DIAGNOSIS — N179 Acute kidney failure, unspecified: Secondary | ICD-10-CM | POA: Diagnosis present

## 2020-02-18 DIAGNOSIS — A419 Sepsis, unspecified organism: Secondary | ICD-10-CM | POA: Diagnosis present

## 2020-02-18 DIAGNOSIS — J159 Unspecified bacterial pneumonia: Secondary | ICD-10-CM | POA: Diagnosis present

## 2020-02-18 DIAGNOSIS — I890 Lymphedema, not elsewhere classified: Secondary | ICD-10-CM | POA: Diagnosis present

## 2020-02-18 HISTORY — DX: Acute and chronic respiratory failure with hypoxia: J96.21

## 2020-02-18 LAB — SARS CORONAVIRUS 2 BY RT PCR (HOSPITAL ORDER, PERFORMED IN ~~LOC~~ HOSPITAL LAB): SARS Coronavirus 2: NEGATIVE

## 2020-02-18 LAB — GLUCOSE, CAPILLARY
Glucose-Capillary: 80 mg/dL (ref 70–99)
Glucose-Capillary: 89 mg/dL (ref 70–99)
Glucose-Capillary: 97 mg/dL (ref 70–99)

## 2020-02-18 LAB — I-STAT ARTERIAL BLOOD GAS, ED
Acid-Base Excess: 0 mmol/L (ref 0.0–2.0)
Bicarbonate: 31.8 mmol/L — ABNORMAL HIGH (ref 20.0–28.0)
Calcium, Ion: 1.27 mmol/L (ref 1.15–1.40)
HCT: 43 % (ref 36.0–46.0)
Hemoglobin: 14.6 g/dL (ref 12.0–15.0)
O2 Saturation: 85 %
Patient temperature: 98.6
Potassium: 5.1 mmol/L (ref 3.5–5.1)
Sodium: 142 mmol/L (ref 135–145)
TCO2: 35 mmol/L — ABNORMAL HIGH (ref 22–32)
pCO2 arterial: 90.4 mmHg (ref 32.0–48.0)
pH, Arterial: 7.155 — CL (ref 7.350–7.450)
pO2, Arterial: 66 mmHg — ABNORMAL LOW (ref 83.0–108.0)

## 2020-02-18 LAB — POCT I-STAT 7, (LYTES, BLD GAS, ICA,H+H)
Acid-Base Excess: 4 mmol/L — ABNORMAL HIGH (ref 0.0–2.0)
Bicarbonate: 34.8 mmol/L — ABNORMAL HIGH (ref 20.0–28.0)
Calcium, Ion: 1.32 mmol/L (ref 1.15–1.40)
HCT: 43 % (ref 36.0–46.0)
Hemoglobin: 14.6 g/dL (ref 12.0–15.0)
O2 Saturation: 94 %
Patient temperature: 98.1
Potassium: 5.3 mmol/L — ABNORMAL HIGH (ref 3.5–5.1)
Sodium: 145 mmol/L (ref 135–145)
TCO2: 37 mmol/L — ABNORMAL HIGH (ref 22–32)
pCO2 arterial: 83.1 mmHg (ref 32.0–48.0)
pH, Arterial: 7.229 — ABNORMAL LOW (ref 7.350–7.450)
pO2, Arterial: 87 mmHg (ref 83.0–108.0)

## 2020-02-18 LAB — BASIC METABOLIC PANEL
Anion gap: 10 (ref 5–15)
BUN: 54 mg/dL — ABNORMAL HIGH (ref 6–20)
CO2: 27 mmol/L (ref 22–32)
Calcium: 8.9 mg/dL (ref 8.9–10.3)
Chloride: 104 mmol/L (ref 98–111)
Creatinine, Ser: 1.52 mg/dL — ABNORMAL HIGH (ref 0.44–1.00)
GFR calc Af Amer: 49 mL/min — ABNORMAL LOW (ref >60)
GFR calc non Af Amer: 42 mL/min — ABNORMAL LOW (ref >60)
Glucose, Bld: 127 mg/dL — ABNORMAL HIGH (ref 70–99)
Potassium: 5 mmol/L (ref 3.5–5.1)
Sodium: 141 mmol/L (ref 135–145)

## 2020-02-18 LAB — CBC WITH DIFFERENTIAL/PLATELET
Abs Immature Granulocytes: 0.03 10*3/uL (ref 0.00–0.07)
Basophils Absolute: 0 10*3/uL (ref 0.0–0.1)
Basophils Relative: 1 %
Eosinophils Absolute: 0.2 10*3/uL (ref 0.0–0.5)
Eosinophils Relative: 3 %
HCT: 47.4 % — ABNORMAL HIGH (ref 36.0–46.0)
Hemoglobin: 13.6 g/dL (ref 12.0–15.0)
Immature Granulocytes: 1 %
Lymphocytes Relative: 7 %
Lymphs Abs: 0.4 10*3/uL — ABNORMAL LOW (ref 0.7–4.0)
MCH: 27.1 pg (ref 26.0–34.0)
MCHC: 28.7 g/dL — ABNORMAL LOW (ref 30.0–36.0)
MCV: 94.6 fL (ref 80.0–100.0)
Monocytes Absolute: 0.7 10*3/uL (ref 0.1–1.0)
Monocytes Relative: 11 %
Neutro Abs: 5.1 10*3/uL (ref 1.7–7.7)
Neutrophils Relative %: 77 %
Platelets: 155 10*3/uL (ref 150–400)
RBC: 5.01 MIL/uL (ref 3.87–5.11)
RDW: 17.9 % — ABNORMAL HIGH (ref 11.5–15.5)
WBC: 6.4 10*3/uL (ref 4.0–10.5)
nRBC: 0.6 % — ABNORMAL HIGH (ref 0.0–0.2)

## 2020-02-18 LAB — BLOOD GAS, ARTERIAL
Acid-Base Excess: 2.2 mmol/L — ABNORMAL HIGH (ref 0.0–2.0)
Bicarbonate: 30.2 mmol/L — ABNORMAL HIGH (ref 20.0–28.0)
Drawn by: 42783
FIO2: 100
O2 Saturation: 95.6 %
Patient temperature: 37
pCO2 arterial: 87.7 mmHg (ref 32.0–48.0)
pH, Arterial: 7.163 — CL (ref 7.350–7.450)
pO2, Arterial: 93.6 mmHg (ref 83.0–108.0)

## 2020-02-18 LAB — MRSA PCR SCREENING: MRSA by PCR: NEGATIVE

## 2020-02-18 LAB — BRAIN NATRIURETIC PEPTIDE: B Natriuretic Peptide: 677.8 pg/mL — ABNORMAL HIGH (ref 0.0–100.0)

## 2020-02-18 LAB — TROPONIN I (HIGH SENSITIVITY): Troponin I (High Sensitivity): 9 ng/L (ref <18)

## 2020-02-18 MED ORDER — IOHEXOL 300 MG/ML  SOLN
75.0000 mL | Freq: Once | INTRAMUSCULAR | Status: AC | PRN
  Administered 2020-02-18: 75 mL via INTRAVENOUS

## 2020-02-18 MED ORDER — CHLORHEXIDINE GLUCONATE 0.12% ORAL RINSE (MEDLINE KIT)
15.0000 mL | Freq: Two times a day (BID) | OROMUCOSAL | Status: DC
  Administered 2020-02-18 – 2020-02-24 (×12): 15 mL via OROMUCOSAL

## 2020-02-18 MED ORDER — POLYETHYLENE GLYCOL 3350 17 G PO PACK: 17.0000 g | PACK | Freq: Every day | ORAL | Status: DC | PRN

## 2020-02-18 MED ORDER — ORAL CARE MOUTH RINSE
15.0000 mL | OROMUCOSAL | Status: DC
  Administered 2020-02-18 – 2020-02-21 (×31): 15 mL via OROMUCOSAL

## 2020-02-18 MED ORDER — BISACODYL 10 MG RE SUPP: 10.0000 mg | Freq: Every day | RECTAL | Status: DC | PRN

## 2020-02-18 MED ORDER — HYDRALAZINE HCL 20 MG/ML IJ SOLN: 10.0000 mg | INTRAMUSCULAR | Status: DC | PRN

## 2020-02-18 MED ORDER — IPRATROPIUM-ALBUTEROL 0.5-2.5 (3) MG/3ML IN SOLN
3.0000 mL | Freq: Once | RESPIRATORY_TRACT | Status: AC
  Administered 2020-02-18: 3 mL via RESPIRATORY_TRACT
  Filled 2020-02-18: qty 3

## 2020-02-18 MED ORDER — CHLORHEXIDINE GLUCONATE CLOTH 2 % EX PADS
6.0000 | MEDICATED_PAD | Freq: Every day | CUTANEOUS | Status: DC
  Administered 2020-02-18 – 2020-02-22 (×4): 6 via TOPICAL

## 2020-02-18 MED ORDER — SODIUM CHLORIDE 0.9 % IV SOLN
500.0000 mg | Freq: Once | INTRAVENOUS | Status: AC
  Administered 2020-02-18: 500 mg via INTRAVENOUS
  Filled 2020-02-18: qty 500

## 2020-02-18 MED ORDER — SODIUM CHLORIDE 0.9 % IV SOLN
2.0000 g | Freq: Three times a day (TID) | INTRAVENOUS | Status: AC
  Administered 2020-02-18 – 2020-02-23 (×15): 2 g via INTRAVENOUS
  Filled 2020-02-18 (×16): qty 2

## 2020-02-18 MED ORDER — HEPARIN SODIUM (PORCINE) 5000 UNIT/ML IJ SOLN
5000.0000 [IU] | Freq: Three times a day (TID) | INTRAMUSCULAR | Status: DC
  Administered 2020-02-18 – 2020-02-27 (×26): 5000 [IU] via SUBCUTANEOUS
  Filled 2020-02-18 (×26): qty 1

## 2020-02-18 MED ORDER — MORPHINE SULFATE (PF) 2 MG/ML IV SOLN
2.0000 mg | Freq: Once | INTRAVENOUS | Status: AC
  Administered 2020-02-18: 2 mg via INTRAVENOUS
  Filled 2020-02-18: qty 1

## 2020-02-18 MED ORDER — SODIUM CHLORIDE 0.9 % IV SOLN
1.0000 g | Freq: Once | INTRAVENOUS | Status: AC
  Administered 2020-02-18: 1 g via INTRAVENOUS
  Filled 2020-02-18: qty 10

## 2020-02-18 MED ORDER — DOCUSATE SODIUM 100 MG PO CAPS: 100.0000 mg | ORAL_CAPSULE | Freq: Two times a day (BID) | ORAL | Status: DC | PRN

## 2020-02-18 MED ORDER — PANTOPRAZOLE SODIUM 40 MG IV SOLR
40.0000 mg | Freq: Every day | INTRAVENOUS | Status: DC
  Administered 2020-02-18 – 2020-02-21 (×4): 40 mg via INTRAVENOUS
  Filled 2020-02-18 (×4): qty 40

## 2020-02-18 MED ORDER — IPRATROPIUM-ALBUTEROL 0.5-2.5 (3) MG/3ML IN SOLN
3.0000 mL | Freq: Four times a day (QID) | RESPIRATORY_TRACT | Status: DC
  Administered 2020-02-18 – 2020-02-19 (×4): 3 mL via RESPIRATORY_TRACT
  Filled 2020-02-18 (×4): qty 3

## 2020-02-18 NOTE — Progress Notes (Signed)
[facility] Progress Note Patient Name: BRENLEY PRIORE DOB: Jul 07, 1981 MRN: [id number]   Date of Service  02/18/2020  HPI/Events of Note  Asked to evaluate patient given recent ABG of 7.16/89 while on ventilator on PSV mode. Patient tachypneic to mid 30s though denies feeling "short of air" when asked. VTe << VTi.  Her uncuffed 6.0 trach was switched to a cuffed 4.0 trach in the ED. There is no audible cuff leak despite reported ventilator volumes.   eICU Interventions  Switched patient to PCV with improvement in volumes and decrease in RR to 26/min suggesting she is getting more adequate ventilation.  We will repeat an ABG in 1 hour to evaluate these settings.  I have also asked the bedside team to evaluate her given the discrepancy between her inhaled and exhaled tidal volumes.     Intervention Category Major Interventions: Acid-Base disturbance - evaluation and management  Charlott Rakes 02/18/2020, 9:46 PM

## 2020-02-18 NOTE — H&P (Signed)
PCCM Interval Note  The Consult note from 8:50am is her H&P  Baltazar Apo, MD, PhD 02/18/2020, 8:55 AM [facility] [phone] or if no answer [phone]

## 2020-02-18 NOTE — Consult Note (Signed)
NAME:  [name] [name], MRN:  [id number], DOB:  1980-12-15, LOS: 0 ADMISSION DATE:  02/17/2020, CONSULTATION DATE:  02/18/20  REFERRING MD:  Dr Lorin Mercy, CHIEF COMPLAINT:  Dyspnea, cough, leg swelling   Brief History     History of present illness   39 year old woman with obesity, severe OSA/OHS.  She has had a tracheostomy in place ever since critical illness in 2017.  She sleeps uncapped, on no positive pressure, is capped and uses [place] O2 daytime.  She presented to the emergency department 6/28 with increased secretions difficulty managing her secretions, more shortness of breath.  Also with some unilateral swelling in her left lower extremity, probably progressed with some weeping and erythema.  In the emergency department she has been hypoxemic despite escalating FiO2 ATC.  At least one mucous plug was able to be suctioned with improvement in SPO2 but remains in the mid 80s percent.  Past Medical History   Past Medical History:  Diagnosis Date  . Chronic diastolic heart failure ([place])    Echo 10/12: EF 50-55 // b. Echo 10/17: EF 50-55, mild BAE, PASP 42 // Echo 10/18: Mild concentric LVH, EF 55-60, normal wall motion, mild MAC, severe LAE, mild TR, PASP 15, trivial pericardial effusion  . CKD (chronic kidney disease) stage 4, GFR 15-29 ml/min (HCC) 10/25/2016   admitted in 2/18 with SCr of 10 in the setting of dehydration from illness, etc >> improved to 5 at DC  . DM2 (diabetes mellitus, type 2) ([facility])    A1c 7.2 in 05/2016  . Gestational diabetes mellitus in pregnancy 05/24/2013  . HTN in pregnancy, chronic 05/24/2013  . Hypertensive heart disease with CHF (congestive heart failure) ([place]) 05/25/2016  . Morbid obesity ([place])   . OSA (obstructive sleep apnea) 07/09/2011   s/p Trach 2/2 prolonged respiratory failure during admx for CHF in 05/2016  . Pulmonary hypertension ([place])    secondary from CHF/OSA  . Sinus tachycardia      Significant Hospital Events     Consults:  [facility]  6/28  Procedures:  Trach change to cuffed #6 [place]  Significant Diagnostic Tests:  CT chest 6/28 >> bilateral basilar scattered infiltrates/atelectasis, cardiomegaly, evidence for secondary PAH   Micro Data:  SARS CoV2 6/28 >> negative Blood 6/28 >>  resp 6/28 >>   Antimicrobials:  Azithro 6/28 x1 Ceftriaxone 6/28 x 1 Cefepime 6/28 >>   Interim history/subjective:  Some improvement in oxygenation after mucous plug suction but remains in the mid 80s Will respond but lethargic, not at her usual baseline  Objective   Blood pressure (!) 95/51, pulse 72, temperature 98.5 F (36.9 C), temperature source Oral, resp. rate (!) 24, SpO2 (!) 74 %, unknown if currently breastfeeding.    FiO2 (%):  [80 %] 80 %  No intake or output data in the 24 hours ending 02/18/20 0810 There were no vitals filed for this visit.  Examination: General: Morbidly obese woman, laying in bed at 30 degrees, a bit lethargic but will wake HENT: Trach in place, #6 cuffless [place].  Yellow secretions noted Lungs: Coarse rhonchi bilaterally without wheeze Cardiovascular: Regular, distant, no murmur heard Abdomen: Obese, nondistended, hypoactive bowel sounds Extremities: no significant edema on the R, woody changes LLE with unroofed blister changes and fluid production, erythema down to forefoot Neuro: Wakes to voice, nods to questions and tries to mouth answers.  Immediately back to sleep, able to move all extremities  Resolved Hospital Problem list     Assessment & Plan:  Acute on chronic hypoxic and hypercapnic respiratory failure.  Suspect acute processes including possible bacterial pneumonia, impact of acute on chronic diastolic CHF, severe sepsis from left lower extremity cellulitis.  Could consider other causes, at risk for pulmonary embolism. -ABG now evaluated, 7.1 6/90/66 -Plan to change her trach now to cuffed (done, #4 cuffed Shiley) -Initiate mechanical ventilation, start with pressure support  mode, PEEP of 8 and follow oxygenation, ABG.  Anticipate that we will be able to use pressure support as her maintenance mode although consider PRVC depending on course -Obtain respiratory cultures -Chest PT, empiric bronchodilators for secretion clearance -Change azithromycin and ceftriaxone to cefepime.  Check MRSA screen.  If positive add vancomycin until culture data returns -Consider evaluation for pulmonary embolism if she fails to improve with management of the above  Severe sepsis.  Sources appear to include bacterial pneumonia, left lower extremity cellulitis -Antibiotics as above -Repeat bilateral lower extremity Dopplers -WOC to assist with left lower extremity wound  History of hypertension Acute on chronic HFpEF -Diuresis if hemodynamics and renal function can tolerate.  Hold off for now given acute renal insufficiency -Holding amlodipine, carvedilol, Lasix, hydralazine, Isordil, lisinopril initially.  Restart when able to take p.o.  Alternatively may need NG tube access if she requires prolonged ventilation  Acute renal insufficiency, question due to severe sepsis -Follow urine output and BMP -Defer diuretics for now -Lisinopril on hold until renal function rebounds  Hyperglycemia.  - CBG's and SSI if indicated     Best practice:  Diet: NPO Pain/Anxiety/Delirium protocol (if indicated): NA VAP protocol (if indicated): 6/28 DVT prophylaxis: heparin GI prophylaxis: PPI Glucose control: SSI Mobility: BR Code Status: Full Family Communication:  Disposition: to ICU  Labs   CBC: Recent Labs  Lab 02/18/20 0024  WBC 6.4  NEUTROABS 5.1  HGB 13.6  HCT 47.4*  MCV 94.6  PLT 626    Basic Metabolic Panel: Recent Labs  Lab 02/18/20 0024  NA 141  K 5.0  CL 104  CO2 27  GLUCOSE 127*  BUN 54*  CREATININE 1.52*  CALCIUM 8.9   GFR: Estimated Creatinine Clearance: 85.2 mL/min (A) (by C-G formula based on SCr of 1.52 mg/dL (H)). Recent Labs  Lab  02/18/20 0024  WBC 6.4    Liver Function Tests: No results for input(s): AST, ALT, ALKPHOS, BILITOT, PROT, ALBUMIN in the last 168 hours. No results for input(s): LIPASE, AMYLASE in the last 168 hours. No results for input(s): AMMONIA in the last 168 hours.  ABG    Component Value Date/Time   PHART 7.286 (L) 08/07/2019 0617   PCO2ART 71.6 (HH) 08/07/2019 0617   PO2ART 195.0 (H) 08/07/2019 0617   HCO3 34.3 (H) 08/07/2019 0617   TCO2 36 (H) 08/07/2019 0617   O2SAT 100.0 08/07/2019 0617     Coagulation Profile: No results for input(s): INR, PROTIME in the last 168 hours.  Cardiac Enzymes: No results for input(s): CKTOTAL, CKMB, CKMBINDEX, TROPONINI in the last 168 hours.  HbA1C: Hgb A1c MFr Bld  Date/Time Value Ref Range Status  11/20/2019 10:20 AM 6.2 4.6 - 6.5 % Final    Comment:    Glycemic Control Guidelines for People with Diabetes:Non Diabetic:  <6%Goal of Therapy: <7%Additional Action Suggested:  >8%   09/15/2016 08:55 AM 6.2 4.6 - 6.5 % Final    Comment:    Glycemic Control Guidelines for People with Diabetes:Non Diabetic:  <6%Goal of Therapy: <7%Additional Action Suggested:  >8%     CBG: No results for  input(s): GLUCAP in the last 168 hours.  Review of Systems:   C/o LLE weeping, increased secretions.   Past Medical History  She,  has a past medical history of Chronic diastolic heart failure ([place]), CKD (chronic kidney disease) stage 4, GFR 15-29 ml/min ([place]) (10/25/2016), DM2 (diabetes mellitus, type 2) ([place]), Gestational diabetes mellitus in pregnancy (05/24/2013), HTN in pregnancy, chronic (05/24/2013), Hypertensive heart disease with CHF (congestive heart failure) ([place]) (05/25/2016), Morbid obesity ([place]), OSA (obstructive sleep apnea) (07/09/2011), Pulmonary hypertension ([place]), and Sinus tachycardia.   Surgical History    Past Surgical History:  Procedure Laterality Date  . TRACHEOSTOMY TUBE PLACEMENT N/A 06/11/2016   Procedure: TRACHEOSTOMY;  Surgeon: Melida Quitter, MD;  Location: [facility];  Service: ENT;  Laterality: N/A;     Social History   reports that she has never smoked. She has never used smokeless tobacco. She reports that she does not drink alcohol and does not use drugs.   Family History   Her family history includes Birth defects in her paternal grandfather; Diabetes in an other family member; Hypertension in her mother.   Allergies Allergies  Allergen Reactions  . No Known Allergies      Home Medications  Prior to Admission medications   Medication Sig Start Date End Date Taking? Authorizing Provider  amLODipine (NORVASC) 10 MG tablet TAKE 1 TABLET BY MOUTH EVERY DAY Patient taking differently: Take 10 mg by mouth daily.  11/26/19  Yes Burtis Junes, NP  carvedilol (COREG) 25 MG tablet Take 1 tablet (25 mg total) by mouth 2 (two) times daily. 12/12/19  Yes Burtis Junes, NP  cetirizine (ZYRTEC) 10 MG tablet Take 1 tablet (10 mg total) by mouth at bedtime. 08/21/19  Yes Nche, Charlene Brooke, NP  furosemide (LASIX) 40 MG tablet TAKE 1 TABLET BY MOUTH EVERY DAY Patient taking differently: Take 40 mg by mouth daily.  12/26/19  Yes Burtis Junes, NP  hydrALAZINE (APRESOLINE) 100 MG tablet TAKE 1 TABLET BY MOUTH THREE TIMES A DAY Patient taking differently: Take 100 mg by mouth 3 (three) times daily.  02/04/20  Yes Burtis Junes, NP  isosorbide dinitrate (ISORDIL) 30 MG tablet TAKE 1 TABLET BY MOUTH THREE TIMES A DAY Patient taking differently: Take 30 mg by mouth 3 (three) times daily.  11/08/19  Yes Burtis Junes, NP  lisinopril (ZESTRIL) 40 MG tablet TAKE 1 TABLET BY MOUTH EVERY DAY Patient taking differently: Take 40 mg by mouth daily.  11/26/19  Yes Burtis Junes, NP  benzonatate (TESSALON) 100 MG capsule Take 1 capsule (100 mg total) by mouth 3 (three) times daily as needed for cough. Patient not taking: Reported on 02/18/2020 11/20/19   Nche, Charlene Brooke, NP  fluticasone [facility]) 50 MCG/ACT nasal spray Place 2 sprays  into both nostrils 2 (two) times daily. Patient not taking: Reported on 02/18/2020 11/08/18   Erick Colace, NP     Critical care time: 29 minutes     Baltazar Apo, MD, PhD 02/18/2020, 8:50 AM [facility] [phone] or if no answer [phone]

## 2020-02-18 NOTE — Progress Notes (Signed)
RT unable to ventilate patient after retrieving several mucus plugs. ED physician paged CCM to come and evaluate. RT and CCM attempted to change patient to a #6 shiley but it would not fit a #4 shiley was then attempted and inserted successfully. Color change and bilateral breath sounds were heard. RT connected patient to the vent.

## 2020-02-18 NOTE — Progress Notes (Signed)
Unable to do patient's CPT at this time.  Patient is on ventilator but is not on an ICU bed at this time, patient is in a bariatric bed.

## 2020-02-18 NOTE — Plan of Care (Signed)
  Problem: Activity: Goal: Ability to tolerate increased activity will improve Outcome: Progressing   Problem: Respiratory: Goal: Ability to maintain a clear airway and adequate ventilation will improve Outcome: Progressing   Problem: Role Relationship: Goal: Method of communication will improve Outcome: Progressing   Problem: Education: Goal: Ability to describe self-care measures that may prevent or decrease complications (Diabetes Survival Skills Education) will improve Outcome: Progressing   Problem: Coping: Goal: Ability to adjust to condition or change in health will improve Outcome: Progressing   Problem: Fluid Volume: Goal: Ability to maintain a balanced intake and output will improve Outcome: Progressing   Problem: Health Behavior/Discharge Planning: Goal: Ability to identify and utilize available resources and services will improve Outcome: Progressing   Problem: Metabolic: Goal: Ability to maintain appropriate glucose levels will improve Outcome: Progressing   Problem: Nutritional: Goal: Maintenance of adequate nutrition will improve Outcome: Progressing Goal: Progress toward achieving an optimal weight will improve Outcome: Progressing   Problem: Skin Integrity: Goal: Risk for impaired skin integrity will decrease Outcome: Progressing

## 2020-02-18 NOTE — Progress Notes (Signed)
Venous duplex       has been completed. Preliminary results can be found under CV proc through chart review. June Leap, BS, RDMS, RVT

## 2020-02-18 NOTE — Progress Notes (Signed)
Tried weaning patient's O2 down to 80%.  Patient started desating into upper 87s.  Gave an O2 breath.  Patient stated she was having trouble breathing, put patient back on 100% FIO2 and re-suctioned patient.  Was able to get mucus plug out, sats went back up to 93%.  If possible, will try to wean later.

## 2020-02-18 NOTE — Progress Notes (Signed)
Was called to bedside of patient by RN and Dr. Gillermina Phy, patient was not getting her volumes.  MD wanted to try patient on PCV to see if we could give her the volumes she needed and put her on a rate.  Patient's rate was also elevated and was able to get it down in the 20s from around 38 after change was made.   Lavaged patient as well and was not able to get much of first pass but tried again and was able to get another plug out. Will get ABG in hour per MD request, will continue to monitor.

## 2020-02-18 NOTE — Progress Notes (Signed)
Rahul, NP, CCM came to room with this RT and Terri, RRT.  Terri suggested that we place patient on [facility] and give her a little sedation.  Patient has a little cuff leak and has been having issues with MVi/MVe.  Rahul suggested putting her rate at 24 since she is already breathing over the vent and has hypoventilation issues as well.  Her PEEP was raised to 10 from 8.  Vt at 450 which is patient's 8cc.  Will draw ABG to see where patient is at with these new settings.

## 2020-02-18 NOTE — Progress Notes (Signed)
RT transported pt from ED to 3M08 while on ventilator. Pt tolerated well.

## 2020-02-18 NOTE — ED Notes (Signed)
This nurse performed trach suctioning per MD order. Thick, brown, dry mucous plug retrieved

## 2020-02-18 NOTE — Progress Notes (Signed)
CRITICAL VALUE ALERT  Critical Value:  PH 7.163   pCO2 87.7  Date & Time Notied:02/18/20 @ 1600   Provider Notified: Byrum  Orders Received/Actions taken: No new orders

## 2020-02-18 NOTE — ED Provider Notes (Addendum)
Emergency Department Provider Note  I have reviewed the triage vital signs and the nursing notes.  HISTORY  Chief Complaint Shortness of Breath   HPI [name] [name] is a 39 y.o. female who presents to the emergency department today with 2 complaints.  Patient states that she is was to get her trach changed this week but does not feel she can wait.  It is working fine she has any secretions or cough but she states her voice is changed and feels like it is to be changed immediately.  She also states that she has some swelling of her left lower leg which is not necessarily new but she has a new weeping fluid from it.  No pain.  No fevers.  No other associated symptoms.   No other associated or modifying symptoms.    Past Medical History:  Diagnosis Date  . Chronic diastolic heart failure ([place])    Echo 10/12: EF 50-55 // b. Echo 10/17: EF 50-55, mild BAE, PASP 42 // Echo 10/18: Mild concentric LVH, EF 55-60, normal wall motion, mild MAC, severe LAE, mild TR, PASP 15, trivial pericardial effusion  . CKD (chronic kidney disease) stage 4, GFR 15-29 ml/min (HCC) 10/25/2016   admitted in 2/18 with SCr of 10 in the setting of dehydration from illness, etc >> improved to 5 at DC  . DM2 (diabetes mellitus, type 2) (HCC)    A1c 7.2 in 05/2016  . Gestational diabetes mellitus in pregnancy 05/24/2013  . HTN in pregnancy, chronic 05/24/2013  . Hypertensive heart disease with CHF (congestive heart failure) ([place]) 05/25/2016  . Morbid obesity ([place])   . OSA (obstructive sleep apnea) 07/09/2011   s/p Trach 2/2 prolonged respiratory failure during admx for CHF in 05/2016  . Pulmonary hypertension ([place])    secondary from CHF/OSA  . Sinus tachycardia     Patient Active Problem List   Diagnosis Date Noted  . Acute pulmonary embolism ([place]) 08/07/2019  . Hypoxemia 08/07/2019  . Tracheostomy care ([place])   . (HFpEF) heart failure with preserved ejection fraction ([place])   . Hypertension   . Peripheral  edema   . Tracheostomy dependence ([place])   . Purulent bronchitis ([place])   . CKD (chronic kidney disease) stage 4, GFR 15-29 ml/min (HCC) 10/25/2016  . Acute renal failure superimposed on stage 3 chronic kidney disease ([place]) 09/15/2016  . Pneumonitis 09/15/2016  . Abnormal urinalysis 09/15/2016  . Dysphagia   . Urinary retention   . Hypoalbuminemia due to protein-calorie malnutrition ([place])   . Debilitated 06/29/2016  . Demand ischemia ([place])   . Moderate single current episode of major depressive disorder ([place])   . Diabetes mellitus ([place])   . Tracheostomy status ([place])   . Chronic diastolic heart failure ([place])   . Obesity hypoventilation syndrome ([place])   . Chronic respiratory failure with hypoxia and hypercapnia (HCC)   . HCAP (healthcare-associated pneumonia)   . Sinus pause 06/07/2016  . Difficult intravenous access   . Hepatic congestion 05/26/2016  . Sinus tachycardia 05/26/2016  . Pericardial effusion 05/26/2016  . Pulmonary hypertension ([place])   . Renal insufficiency 05/25/2016  . Abnormal EKG 05/25/2016  . Abdominal pain 05/25/2016  . Hypertensive heart disease with CHF (congestive heart failure) ([place]) 05/25/2016  . Oligohydramnios, delivered 05/27/2013  . NSVD (normal spontaneous vaginal delivery) 05/27/2013  . OSA (obstructive sleep apnea) 07/09/2011  . Morbid obesity ([place]) 05/18/2011    Past Surgical History:  Procedure Laterality Date  . TRACHEOSTOMY TUBE PLACEMENT N/A  06/11/2016   Procedure: TRACHEOSTOMY;  Surgeon: Melida Quitter, MD;  Location: [place];  Service: ENT;  Laterality: N/A;    Current Outpatient Rx  . Order #: [id number] Class: Normal  . Order #: [id number] Class: Normal  . Order #: [id number] Class: Normal  . Order #: [id number] Class: Normal  . Order #: [id number] Class: Normal  . Order #: [id number] Class: Normal  . Order #: [id number] Class: Normal  . Order #: [id number] Class: Normal  . Order #: [id number] Class: Normal    Allergies No known allergies  Family  History  Problem Relation Age of Onset  . Hypertension Mother   . Diabetes Other   . Birth defects Paternal Grandfather        unknown type    Social History Social History   Tobacco Use  . Smoking status: Never Smoker  . Smokeless tobacco: Never Used  Vaping Use  . Vaping Use: Never used  Substance Use Topics  . Alcohol use: No  . Drug use: No    Review of Systems  All other systems negative except as documented in the HPI. All pertinent positives and negatives as reviewed in the HPI. ____________________________________________  PHYSICAL EXAM:  VITAL SIGNS: ED Triage Vitals  Enc Vitals Group     BP 02/17/20 2124 121/67     Pulse Rate 02/17/20 2124 82     Resp 02/17/20 2124 (!) 22     Temp 02/17/20 2124 98.2 F (36.8 C)     Temp Source 02/17/20 2124 Oral     SpO2 02/17/20 2124 97 %    Constitutional: Alert and oriented. Well appearing and in no acute distress. Eyes: Conjunctivae are normal. PERRL. EOMI. Head: Atraumatic. Nose: No congestion/rhinnorhea. Mouth/Throat: Mucous membranes are moist.  Oropharynx non-erythematous. Neck: No stridor.  No meningeal signs.   Cardiovascular: Normal rate, regular rhythm. Good peripheral circulation. Grossly normal heart sounds.   Respiratory: tachypneic respiratory effort.  No retractions. Lungs somewhat rhonchorous. Gastrointestinal: Soft and nontender. No distention.  Musculoskeletal: No lower extremity tenderness nor edema. No gross deformities of extremities. Neurologic:  Normal speech and language. No gross focal neurologic deficits are appreciated.  Skin:  Skin is warm, dry and intact. No rash noted.  Left leg with significant lymphedema and thickened skin with associated open wounds and mild purulent drainage with some surrounding erythema.  ____________________________________________   LABS (all labs ordered are listed, but only abnormal results are displayed)  Labs Reviewed  CBC WITH DIFFERENTIAL/PLATELET -  Abnormal; Notable for the following components:      Result Value   HCT 47.4 (*)    MCHC 28.7 (*)    RDW 17.9 (*)    nRBC 0.6 (*)    Lymphs Abs 0.4 (*)    All other components within normal limits  BASIC METABOLIC PANEL - Abnormal; Notable for the following components:   Glucose, Bld 127 (*)    BUN 54 (*)    Creatinine, Ser 1.52 (*)    GFR calc non Af Amer 42 (*)    GFR calc Af Amer 49 (*)    All other components within normal limits  BRAIN NATRIURETIC PEPTIDE - Abnormal; Notable for the following components:   B Natriuretic Peptide 677.8 (*)    All other components within normal limits  SARS CORONAVIRUS 2 BY RT PCR (HOSPITAL ORDER, [facility])  EXPECTORATED SPUTUM ASSESSMENT W REFEX TO RESP CULTURE  CULTURE, BLOOD (ROUTINE X 2)  CULTURE, BLOOD (ROUTINE X 2)  TROPONIN I (HIGH SENSITIVITY)  ____________________________________________  EKG   EKG Interpretation  Date/Time:    Ventricular Rate:    PR Interval:    QRS Duration:   QT Interval:    QTC Calculation:   R Axis:     Text Interpretation:         ____________________________________________  RADIOLOGY  CT Chest W Contrast  Result Date: 02/18/2020 CLINICAL DATA:  Increased shortness of breath. EXAM: CT CHEST WITH CONTRAST TECHNIQUE: Multidetector CT imaging of the chest was performed during intravenous contrast administration. CONTRAST:  60m OMNIPAQUE IOHEXOL 300 MG/ML  SOLN COMPARISON:  08/06/2019 chest CTA FINDINGS: Cardiovascular: Cardiomegaly and enlarged main pulmonary artery a 5.2 cm. There is known pulmonary hypertension. No acute vascular finding. No pericardial effusion. Mediastinum/Nodes: No noted adenopathy.  Tracheostomy tube in place. Lungs/Pleura: Patchy streaky and consolidative opacities mainly in the lower lobes. No edema, effusion, or pneumothorax. Mosaic attenuation of lungs in areas not affected by consolidation, history of pulmonary hypertension suggesting small  airways disease. Upper Abdomen: No acute finding Musculoskeletal: Spondylosis with multi-level ankylosis. IMPRESSION: 1. Pneumonia/atelectasis in the dependent lungs. 2. Cardiomegaly and pulmonary hypertension. Electronically Signed   By: JMonte FantasiaM.D.   On: 02/18/2020 05:12   DG Chest Portable 1 View  Result Date: 02/18/2020 CLINICAL DATA:  Shortness of breath. EXAM: PORTABLE CHEST 1 VIEW COMPARISON:  08/10/2019 FINDINGS: Tracheostomy tube tip at the thoracic inlet. Chronic cardiomegaly. Interstitial thickening suspicious for pulmonary edema, similar to prior. Poor definition the lung bases which may be due to soft tissue attenuation or pleural effusions/airspace disease. No pneumothorax. IMPRESSION: 1. Chronic cardiomegaly with interstitial thickening suspicious for pulmonary edema. 2. Poor definition of the lung bases which may be due to soft tissue attenuation or pleural effusions/airspace disease. 3. Tracheostomy tube tip at the thoracic inlet. Electronically Signed   By: MKeith RakeM.D.   On: 02/18/2020 00:58   ____________________________________________  PROCEDURES  Procedure(s) performed:   .Critical Care Performed by: MMerrily Pew MD Authorized by: MMerrily Pew MD   Critical care provider statement:    Critical care time (minutes):  45   Critical care was necessary to treat or prevent imminent or life-threatening deterioration of the following conditions:  Respiratory failure   Critical care was time spent personally by me on the following activities:  Discussions with consultants, evaluation of patient's response to treatment, examination of patient, ordering and performing treatments and interventions, ordering and review of laboratory studies, ordering and review of radiographic studies, pulse oximetry, re-evaluation of patient's condition, obtaining history from patient or surrogate and review of old charts   ____________________________________________  INITIAL  IMPRESSION / AGrenelefe/ ED COURSE   This patient presents to the ED for concern of trach change and leg drainage, this involves an extensive number of treatment options, and is a complaint that carries with it a high risk of complications and morbidity.  The differential diagnosis includes pneumonia, trach malfunction, cellulitis, abscess, dvt, lymphedema   Lab Tests:   I Ordered, reviewed, and interpreted labs, which included cbc/bmp/troponin/bnp   Medicines ordered:   I ordered medication antibiotics  For cellulitis   Imaging Studies ordered:   I independently visualized and interpreted imaging cxr/ct which showed poss pulm edema vs pneumonia, will treat for both.   Additional history obtained:   Additional history obtained from mother who is mostly worried about patient's dyspnea.   Previous records obtained and reviewed in epic.   Consultations Obtained:   I consulted medicine, Dr. TMarcello Moores for admission.  ____________________________________________  FINAL CLINICAL IMPRESSION(S) / ED DIAGNOSES  Final diagnoses:  Hypoxia    MEDICATIONS GIVEN DURING THIS VISIT:  Medications  azithromycin (ZITHROMAX) 500 mg in sodium chloride 0.9 % 250 mL IVPB (500 mg Intravenous New Bag/Given 02/18/20 0641)  ipratropium-albuterol (DUONEB) 0.5-2.5 (3) MG/3ML nebulizer solution 3 mL (3 mLs Nebulization Given 02/18/20 0237)  iohexol (OMNIPAQUE) 300 MG/ML solution 75 mL (75 mLs Intravenous Contrast Given 02/18/20 0448)  cefTRIAXone (ROCEPHIN) 1 g in sodium chloride 0.9 % 100 mL IVPB (0 g Intravenous Stopped 02/18/20 0604)    NEW OUTPATIENT MEDICATIONS STARTED DURING THIS VISIT:  New Prescriptions   No medications on file    Note:  This note was prepared with assistance of Dragon voice recognition software. Occasional wrong-word or sound-a-like substitutions may have occurred due to the inherent limitations of voice recognition software.   Wane Mollett, Corene Cornea, MD 02/18/20  [id number]    Merrily Pew, MD 02/28/20 [phone]

## 2020-02-18 NOTE — Progress Notes (Signed)
Orthopedic Tech Progress Note Patient Details:  [name] [name] Apr 20, 1981 [id number]  Ortho Devices Type of Ortho Device: Louretta Parma boot Ortho Device/Splint Location: lle. applied with RN's assist holding the leg. Ortho Device/Splint Interventions: Ordered, Application, Adjustment   Post Interventions Patient Tolerated: Well Instructions Provided: Care of device, Adjustment of device   [name] 02/18/2020, 8:37 PM

## 2020-02-18 NOTE — Consult Note (Signed)
ER Consult Note   [name] [name] HTX:[id number] DOB: 05-13-1981 DOA: 02/17/2020  PCP: Flossie Buffy, NP Consultants:  [facility] - cardiology; [facility] - pulmonology Patient coming from:  Home; NOK: Mother   Chief Complaint: SOB  HPI: [name] [name] is a 39 y.o. female with medical history significant of pulmonary HTN; OSA s/p trach; morbid obesity; HTN; DM; stage 4 CKD; and chronic diastolic CHF presenting with SOB.  Patient awakens to voice/touch but does not attempt to answer questions given trach without Pauci-Muir valve.  Upon my walking in the room, O2 sats were 66% with good pleth despite full O2 trach collar support.  I called RT and then PCCM and care was assumed.    ED Course:   Carryover, per Dr. Marcello Moores:  Patient 39 y/o has trach, 5-6l , has trach due to OSA unable to wean  Cc:sob , in ed on NRB, on home trach color in 60's  Cxr: BNP 677, cxr ? Pul edema However CT chest noted pna  Lower leg cellulitis as well.  Sat 86% when sleeping, awake 92  tx with antibiotics /lasix  Working dx CAP/corpulmonale/ cellulitis   Review of Systems: Unable to perform  PMH, PSH, SH, and FH were reviewed in Epic  Past Medical History:  Diagnosis Date  . Chronic diastolic heart failure ([place])    Echo 10/12: EF 50-55 // b. Echo 10/17: EF 50-55, mild BAE, PASP 42 // Echo 10/18: Mild concentric LVH, EF 55-60, normal wall motion, mild MAC, severe LAE, mild TR, PASP 15, trivial pericardial effusion  . CKD (chronic kidney disease) stage 4, GFR 15-29 ml/min (HCC) 10/25/2016   admitted in 2/18 with SCr of 10 in the setting of dehydration from illness, etc >> improved to 5 at DC  . DM2 (diabetes mellitus, type 2) (HCC)    A1c 7.2 in 05/2016  . Gestational diabetes mellitus in pregnancy 05/24/2013  . HTN in pregnancy, chronic 05/24/2013  . Hypertensive heart disease with CHF (congestive heart failure) ([place]) 05/25/2016  . Morbid obesity ([place])   . OSA (obstructive sleep apnea) 07/09/2011   s/p  Trach 2/2 prolonged respiratory failure during admx for CHF in 05/2016  . Pulmonary hypertension ([place])    secondary from CHF/OSA  . Sinus tachycardia     Past Surgical History:  Procedure Laterality Date  . TRACHEOSTOMY TUBE PLACEMENT N/A 06/11/2016   Procedure: TRACHEOSTOMY;  Surgeon: Melida Quitter, MD;  Location: [facility];  Service: ENT;  Laterality: N/A;    Social History   Socioeconomic History  . Marital status: Single    Spouse name: Not on file  . Number of children: Not on file  . Years of education: Not on file  . Highest education level: Not on file  Occupational History  . Occupation: Chief Technology Officer: [employer]  Tobacco Use  . Smoking status: Never Smoker  . Smokeless tobacco: Never Used  Vaping Use  . Vaping Use: Never used  Substance and Sexual Activity  . Alcohol use: No  . Drug use: No  . Sexual activity: Not Currently    Birth control/protection: I.U.D.  Other Topics Concern  . 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 Difficulty of Paying Living Expenses:   Food Insecurity:   .  Worried About Charity fundraiser in the Last Year:   . Arboriculturist in the Last Year:   Transportation Needs:   . Film/video editor (Medical):   Marland Kitchen Lack of Transportation (Non-Medical):   Physical Activity:   . Days of Exercise per Week:   . Minutes of Exercise per Session:   Stress:   . Feeling of Stress :   Social Connections:   . Frequency of Communication with Friends and Family:   . Frequency of Social Gatherings with Friends and Family:   . Attends Religious Services:   . Active Member of Clubs or Organizations:   . Attends Archivist Meetings:   Marland Kitchen Marital Status:   Intimate Partner Violence:   . Fear of Current or Ex-Partner:   . Emotionally Abused:   Marland Kitchen Physically  Abused:   . Sexually Abused:     Allergies  Allergen Reactions  . No Known Allergies     Family History  Problem Relation Age of Onset  . Hypertension Mother   . Diabetes Other   . Birth defects Paternal Grandfather        unknown type    Prior to Admission medications   Medication Sig Start Date End Date Taking? Authorizing Provider  amLODipine (NORVASC) 10 MG tablet TAKE 1 TABLET BY MOUTH EVERY DAY Patient taking differently: Take 10 mg by mouth daily.  11/26/19  Yes Burtis Junes, NP  carvedilol (COREG) 25 MG tablet Take 1 tablet (25 mg total) by mouth 2 (two) times daily. 12/12/19  Yes Burtis Junes, NP  cetirizine (ZYRTEC) 10 MG tablet Take 1 tablet (10 mg total) by mouth at bedtime. 08/21/19  Yes Nche, Charlene Brooke, NP  furosemide (LASIX) 40 MG tablet TAKE 1 TABLET BY MOUTH EVERY DAY Patient taking differently: Take 40 mg by mouth daily.  12/26/19  Yes Burtis Junes, NP  hydrALAZINE (APRESOLINE) 100 MG tablet TAKE 1 TABLET BY MOUTH THREE TIMES A DAY Patient taking differently: Take 100 mg by mouth 3 (three) times daily.  02/04/20  Yes Burtis Junes, NP  isosorbide dinitrate (ISORDIL) 30 MG tablet TAKE 1 TABLET BY MOUTH THREE TIMES A DAY Patient taking differently: Take 30 mg by mouth 3 (three) times daily.  11/08/19  Yes Burtis Junes, NP  lisinopril (ZESTRIL) 40 MG tablet TAKE 1 TABLET BY MOUTH EVERY DAY Patient taking differently: Take 40 mg by mouth daily.  11/26/19  Yes Burtis Junes, NP  benzonatate (TESSALON) 100 MG capsule Take 1 capsule (100 mg total) by mouth 3 (three) times daily as needed for cough. Patient not taking: Reported on 02/18/2020 11/20/19   Nche, Charlene Brooke, NP  fluticasone [facility]) 50 MCG/ACT nasal spray Place 2 sprays into both nostrils 2 (two) times daily. Patient not taking: Reported on 02/18/2020 11/08/18   Erick Colace, NP    Physical Exam: Vitals:   02/18/20 0900 02/18/20 0905 02/18/20 0915 02/18/20 0950  BP: 130/74  139/67  129/74  Pulse: 79 81 82 80  Resp: 18 (!) 27 (!) 30 (!) 28  Temp:      TempSrc:      SpO2: 94% 96% 96% 96%     . General:  Appears calm and comfortable and is NAD . Eyes:  PERRL, EOMI, normal lids, iris . ENT:  grossly normal hearing . Neck:  Trach in place, on trach collar . Cardiovascular:  RRR, no m/r/g. No LE edema.  Marland Kitchen Respiratory:  Poor air movement, mildly increased respiratory effort . Abdomen:  soft, NT, ND, NABS, obese . Skin:  Excoriated and erythematous rash with serosanguinous drainage of LLE     . Musculoskeletal:  decreased tone BUE < BLE, no bony abnormality . Psychiatric: mouthed a few words but generally somnolent and non-conversant but pleasant when aroused . Neurologic:  Unable to perform    Radiological Exams on Admission: CT Chest W Contrast  Result Date: 02/18/2020 CLINICAL DATA:  Increased shortness of breath. EXAM: CT CHEST WITH CONTRAST TECHNIQUE: Multidetector CT imaging of the chest was performed during intravenous contrast administration. CONTRAST:  79m OMNIPAQUE IOHEXOL 300 MG/ML  SOLN COMPARISON:  08/06/2019 chest CTA FINDINGS: Cardiovascular: Cardiomegaly and enlarged main pulmonary artery a 5.2 cm. There is known pulmonary hypertension. No acute vascular finding. No pericardial effusion. Mediastinum/Nodes: No noted adenopathy.  Tracheostomy tube in place. Lungs/Pleura: Patchy streaky and consolidative opacities mainly in the lower lobes. No edema, effusion, or pneumothorax. Mosaic attenuation of lungs in areas not affected by consolidation, history of pulmonary hypertension suggesting small airways disease. Upper Abdomen: No acute finding Musculoskeletal: Spondylosis with multi-level ankylosis. IMPRESSION: 1. Pneumonia/atelectasis in the dependent lungs. 2. Cardiomegaly and pulmonary hypertension. Electronically Signed   By: JMonte FantasiaM.D.   On: 02/18/2020 05:12   DG Chest Port 1 View  Result Date: 02/18/2020 CLINICAL DATA:  Tracheostomy  replacement EXAM: PORTABLE CHEST 1 VIEW COMPARISON:  Earlier same day FINDINGS: Tracheostomy device overlies the trachea. Low lung volumes with patchy bilateral opacities, greatest at the lung bases. No significant pleural effusion. Cardiomegaly. IMPRESSION: Tracheostomy device is present. Low lung volumes with patchy bilateral atelectasis/consolidation, greatest at the lung bases. Electronically Signed   By: PMacy MisM.D.   On: 02/18/2020 09:06   DG Chest Portable 1 View  Result Date: 02/18/2020 CLINICAL DATA:  Shortness of breath. EXAM: PORTABLE CHEST 1 VIEW COMPARISON:  08/10/2019 FINDINGS: Tracheostomy tube tip at the thoracic inlet. Chronic cardiomegaly. Interstitial thickening suspicious for pulmonary edema, similar to prior. Poor definition the lung bases which may be due to soft tissue attenuation or pleural effusions/airspace disease. No pneumothorax. IMPRESSION: 1. Chronic cardiomegaly with interstitial thickening suspicious for pulmonary edema. 2. Poor definition of the lung bases which may be due to soft tissue attenuation or pleural effusions/airspace disease. 3. Tracheostomy tube tip at the thoracic inlet. Electronically Signed   By: MKeith RakeM.D.   On: 02/18/2020 00:58    EKG: not done   Labs on Admission: I have personally reviewed the available labs and imaging studies at the time of the admission.  Pertinent labs:   Glucose 127 BUN 54/Creatinine 1.52/GFR 49; 16/0.98/76 on 3/30 BNP 677.8 HS troponin 9 WBC 6.4 A1c 6.2 on 3/30 COVID negative ABG: 7.155/90.4/66/31.8/85%   Assessment/Plan Principal Problem:   Acute respiratory failure with hypoxia and hypercapnia (HCC) Active Problems:   Morbid obesity ([place]   Diabetes mellitus ([place]   Acute renal failure superimposed on stage 3 chronic kidney disease ([place]   Tracheostomy dependence ([place]   Hypertension   (HFpEF) heart failure with preserved ejection fraction (HCC)   Cellulitis    -Patient with chronic  trach-dependence associated with OSA -Presented with SOB, difficult to confirm history -At the time of my evaluation, the patient was clearly in respiratory distress with poor air movement and good pleth with O2 sats in 60s; she also had a mucus glob covering her mouth with no attempt to move it -She appeared quite somnolent and not interactive -As such, I  called RT to the bedside and requested PCCM evaluation -ABG obtained and confirmed hypercapnia/hypoxia -PCCM assumed care -In addition to her respiratory failure, she also appears to have LLE cellulitis associated with stasis dermatitis -At this time, TRH will sign off -We will be happy to assume care when the patient is stable to transfer out of ICU    Note: This patient has been tested and is negative for the novel coronavirus COVID-19.    Total critical care time: 35 minutes Critical care time was exclusive of separately billable procedures and treating other patients. Critical care was necessary to treat or prevent imminent or life-threatening deterioration. Critical care was time spent personally by me on the following activities: development of treatment plan with patient and/or surrogate as well as nursing, discussions with consultants, evaluation of patient's response to treatment, examination of patient, obtaining history from patient or surrogate, ordering and performing treatments and interventions, ordering and review of laboratory studies, ordering and review of radiographic studies, pulse oximetry and re-evaluation of patient's condition.    Karmen Bongo MD [facility]   How to contact the [facility] Attending or Consulting provider [place] or covering provider during after hours [place], for this patient?  1. Check the care team in [facility] and look for a) attending/consulting [facility] provider listed and b) the [facility] team listed 2. Log into [email or web address] and use [place]'s universal password to access. If you do not have the  password, please contact the hospital operator. 3. Locate the [facility] provider you are looking for under [facility] and page to a number that you can be directly reached. 4. If you still have difficulty reaching the provider, please page the [facility] (Director on Call) for the Hospitalists listed on amion for assistance.   02/18/2020, 10:00 AM

## 2020-02-18 NOTE — Consult Note (Addendum)
WOC Nurse Consult Note: Patient receiving care in [place].  Consult completed remotely after review of record, including image of LLE. Reason for Consult: lymphedema and wounds to LLE Wound type: related to lymphedema Pressure Injury POA: Yes/No/NA Measurement: see photo Wound bed: see photo Drainage (amount, consistency, odor)  Periwound: Dressing procedure/placement/frequency: I have ordered care for the leg, Aquacel dressings, and Ortho Tech to apply today and change on Mondays and Thursdays.  Thank you for the consult. [place] nurse will not follow at this time.  Please re-consult the [place] team if needed.  Val Riles, RN, MSN, CWOCN, CNS-BC, pager [phone]

## 2020-02-18 NOTE — Plan of Care (Signed)
  Problem: Activity: Goal: Ability to tolerate increased activity will improve Outcome: Progressing   Problem: Respiratory: Goal: Ability to maintain a clear airway and adequate ventilation will improve Outcome: Progressing   Problem: Role Relationship: Goal: Method of communication will improve Outcome: Progressing

## 2020-02-18 NOTE — Progress Notes (Signed)
Pharmacy Antibiotic Note  [name] [name] is a 39 y.o. female admitted on 02/17/2020 with pneumonia and wound infection.  Pharmacy has been consulted for cefepime dosing. Patient received 1x doses of ceftriaxone/azithromycin in the ER this morning. SCr 1.52 on admit.  Plan: Cefepime 2g IV q8h Monitor clinical progress, c/s, renal function F/u de-escalation plan/LOT      Temp (24hrs), Avg:98.4 F (36.9 C), Min:98.2 F (36.8 C), Max:98.5 F (36.9 C)  Recent Labs  Lab 02/18/20 0024  WBC 6.4  CREATININE 1.52*    Estimated Creatinine Clearance: 85.2 mL/min (A) (by C-G formula based on SCr of 1.52 mg/dL (H)).    Allergies  Allergen Reactions  . No Known Allergies     Arturo Morton, PharmD, BCPS Please check AMION for all [place] contact numbers Clinical Pharmacist 02/18/2020 8:36 AM

## 2020-02-18 NOTE — Progress Notes (Addendum)
Read back results of ABG to [name], RN in black box so Dr. Gillermina Phy could have results sooner.  Docked machine.  Results for [name] [name], [name] [name] (MRN [id number]) as of 02/18/2020 23:39  Ref. Range 02/18/2020 23:33  Sample type Unknown ARTERIAL  pH, Arterial Latest Ref Range: 7.35 - 7.45  7.229 (L)  pCO2 arterial Latest Ref Range: 32 - 48 mmHg 83.1 (HH)  pO2, Arterial Latest Ref Range: 83 - 108 mmHg 87  TCO2 Latest Ref Range: 22 - 32 mmol/L 37 (H)  Acid-Base Excess Latest Ref Range: 0.0 - 2.0 mmol/L 4.0 (H)  Bicarbonate Latest Ref Range: 20.0 - 28.0 mmol/L 34.8 (H)  O2 Saturation Latest Units: % 94.0  Patient temperature Unknown 98.1 F

## 2020-02-18 NOTE — Progress Notes (Signed)
[place] Progress Note Patient Name: [name] [name] DOB: 1981/07/26 MRN: [id number]   Date of Service  02/18/2020  HPI/Events of Note  ABG only marginally improved after setting changes. 7.23/83/87/37.  Continues to have significant leak. RR back to 30s or higher.   eICU Interventions  I have asked bedside team to re-evaluate.     Intervention Category Major Interventions: Acid-Base disturbance - evaluation and management;Airway management  Marily Lente Celestia Duva 02/18/2020, 11:47 PM

## 2020-02-19 ENCOUNTER — Inpatient Hospital Stay (HOSPITAL_COMMUNITY): Payer: Medicare Other

## 2020-02-19 DIAGNOSIS — J9601 Acute respiratory failure with hypoxia: Secondary | ICD-10-CM

## 2020-02-19 DIAGNOSIS — J9602 Acute respiratory failure with hypercapnia: Secondary | ICD-10-CM

## 2020-02-19 LAB — POCT I-STAT 7, (LYTES, BLD GAS, ICA,H+H)
Acid-Base Excess: 4 mmol/L — ABNORMAL HIGH (ref 0.0–2.0)
Acid-Base Excess: 4 mmol/L — ABNORMAL HIGH (ref 0.0–2.0)
Bicarbonate: 33.9 mmol/L — ABNORMAL HIGH (ref 20.0–28.0)
Bicarbonate: 32.7 mmol/L — ABNORMAL HIGH (ref 20.0–28.0)
Calcium, Ion: 1.31 mmol/L (ref 1.15–1.40)
Calcium, Ion: 1.28 mmol/L (ref 1.15–1.40)
HCT: 40 % (ref 36.0–46.0)
HCT: 40 % (ref 36.0–46.0)
Hemoglobin: 13.6 g/dL (ref 12.0–15.0)
Hemoglobin: 13.6 g/dL (ref 12.0–15.0)
O2 Saturation: 96 %
O2 Saturation: 94 %
Patient temperature: 98.5
Patient temperature: 98.5
Potassium: 5.1 mmol/L (ref 3.5–5.1)
Potassium: 4.9 mmol/L (ref 3.5–5.1)
Sodium: 146 mmol/L — ABNORMAL HIGH (ref 135–145)
Sodium: 146 mmol/L — ABNORMAL HIGH (ref 135–145)
TCO2: 36 mmol/L — ABNORMAL HIGH (ref 22–32)
TCO2: 35 mmol/L — ABNORMAL HIGH (ref 22–32)
pCO2 arterial: 78.3 mmHg (ref 32.0–48.0)
pCO2 arterial: 66.4 mmHg (ref 32.0–48.0)
pH, Arterial: 7.244 — ABNORMAL LOW (ref 7.350–7.450)
pH, Arterial: 7.301 — ABNORMAL LOW (ref 7.350–7.450)
pO2, Arterial: 101 mmHg (ref 83.0–108.0)
pO2, Arterial: 80 mmHg — ABNORMAL LOW (ref 83.0–108.0)

## 2020-02-19 LAB — CBC
HCT: 44.8 % (ref 36.0–46.0)
Hemoglobin: 12.2 g/dL (ref 12.0–15.0)
MCH: 26.2 pg (ref 26.0–34.0)
MCHC: 27.2 g/dL — ABNORMAL LOW (ref 30.0–36.0)
MCV: 96.1 fL (ref 80.0–100.0)
Platelets: 118 10*3/uL — ABNORMAL LOW (ref 150–400)
RBC: 4.66 MIL/uL (ref 3.87–5.11)
RDW: 17.9 % — ABNORMAL HIGH (ref 11.5–15.5)
WBC: 5.4 10*3/uL (ref 4.0–10.5)
nRBC: 1.8 % — ABNORMAL HIGH (ref 0.0–0.2)

## 2020-02-19 LAB — RENAL FUNCTION PANEL
Albumin: 2.6 g/dL — ABNORMAL LOW (ref 3.5–5.0)
Anion gap: 9 (ref 5–15)
BUN: 45 mg/dL — ABNORMAL HIGH (ref 6–20)
CO2: 28 mmol/L (ref 22–32)
Calcium: 8.9 mg/dL (ref 8.9–10.3)
Chloride: 107 mmol/L (ref 98–111)
Creatinine, Ser: 1.48 mg/dL — ABNORMAL HIGH (ref 0.44–1.00)
GFR calc Af Amer: 51 mL/min — ABNORMAL LOW (ref >60)
GFR calc non Af Amer: 44 mL/min — ABNORMAL LOW (ref >60)
Glucose, Bld: 78 mg/dL (ref 70–99)
Phosphorus: 4.3 mg/dL (ref 2.5–4.6)
Potassium: 5.1 mmol/L (ref 3.5–5.1)
Sodium: 144 mmol/L (ref 135–145)

## 2020-02-19 LAB — GLUCOSE, CAPILLARY
Glucose-Capillary: 108 mg/dL — ABNORMAL HIGH (ref 70–99)
Glucose-Capillary: 69 mg/dL — ABNORMAL LOW (ref 70–99)
Glucose-Capillary: 70 mg/dL (ref 70–99)
Glucose-Capillary: 72 mg/dL (ref 70–99)
Glucose-Capillary: 73 mg/dL (ref 70–99)
Glucose-Capillary: 79 mg/dL (ref 70–99)
Glucose-Capillary: 83 mg/dL (ref 70–99)

## 2020-02-19 LAB — TRIGLYCERIDES: Triglycerides: 89 mg/dL (ref <150)

## 2020-02-19 MED ORDER — FENTANYL CITRATE (PF) 100 MCG/2ML IJ SOLN
INTRAMUSCULAR | Status: AC
  Filled 2020-02-19: qty 2

## 2020-02-19 MED ORDER — SUCCINYLCHOLINE CHLORIDE 200 MG/10ML IV SOSY
PREFILLED_SYRINGE | INTRAVENOUS | Status: AC
  Filled 2020-02-19: qty 10

## 2020-02-19 MED ORDER — DEXTROSE-NACL 5-0.9 % IV SOLN: INTRAVENOUS | Status: DC

## 2020-02-19 MED ORDER — FUROSEMIDE 10 MG/ML IJ SOLN
40.0000 mg | Freq: Four times a day (QID) | INTRAMUSCULAR | Status: AC
  Administered 2020-02-19 (×2): 40 mg via INTRAVENOUS
  Filled 2020-02-19 (×2): qty 4

## 2020-02-19 MED ORDER — ETOMIDATE 2 MG/ML IV SOLN
INTRAVENOUS | Status: AC
  Administered 2020-02-19: 10 mg
  Filled 2020-02-19: qty 20

## 2020-02-19 MED ORDER — VECURONIUM BROMIDE 10 MG IV SOLR
INTRAVENOUS | Status: AC
  Filled 2020-02-19: qty 10

## 2020-02-19 MED ORDER — IPRATROPIUM-ALBUTEROL 0.5-2.5 (3) MG/3ML IN SOLN: 3.0000 mL | RESPIRATORY_TRACT | Status: DC | PRN

## 2020-02-19 MED ORDER — PROPOFOL 1000 MG/100ML IV EMUL
INTRAVENOUS | Status: AC
  Filled 2020-02-19: qty 100

## 2020-02-19 MED ORDER — MIDAZOLAM HCL 2 MG/2ML IJ SOLN
INTRAMUSCULAR | Status: AC
  Administered 2020-02-19: 4 mg
  Filled 2020-02-19: qty 4

## 2020-02-19 MED ORDER — FENTANYL CITRATE (PF) 100 MCG/2ML IJ SOLN
100.0000 ug | Freq: Once | INTRAMUSCULAR | Status: AC
  Administered 2020-02-19: 100 ug via INTRAVENOUS

## 2020-02-19 MED ORDER — ROCURONIUM BROMIDE 10 MG/ML (PF) SYRINGE
PREFILLED_SYRINGE | INTRAVENOUS | Status: AC
  Administered 2020-02-19: 80 mg
  Filled 2020-02-19: qty 10

## 2020-02-19 MED ORDER — MIDAZOLAM HCL 2 MG/2ML IJ SOLN
INTRAMUSCULAR | Status: AC
  Filled 2020-02-19: qty 2

## 2020-02-19 MED ORDER — STERILE WATER FOR INJECTION IJ SOLN
INTRAMUSCULAR | Status: AC
  Filled 2020-02-19: qty 10

## 2020-02-19 MED ORDER — SODIUM CHLORIDE 0.9 % IV BOLUS
500.0000 mL | Freq: Once | INTRAVENOUS | Status: AC
  Administered 2020-02-19: 500 mL via INTRAVENOUS

## 2020-02-19 MED ORDER — MIDAZOLAM HCL 2 MG/2ML IJ SOLN
2.0000 mg | Freq: Once | INTRAMUSCULAR | Status: AC
  Administered 2020-02-19: 2 mg via INTRAVENOUS

## 2020-02-19 MED ORDER — FENTANYL CITRATE (PF) 100 MCG/2ML IJ SOLN
INTRAMUSCULAR | Status: AC
  Administered 2020-02-19: 100 ug
  Filled 2020-02-19: qty 2

## 2020-02-19 MED ORDER — PROPOFOL 1000 MG/100ML IV EMUL
5.0000 ug/kg/min | INTRAVENOUS | Status: DC
  Administered 2020-02-19: 25 ug/kg/min via INTRAVENOUS
  Administered 2020-02-19 (×2): 30 ug/kg/min via INTRAVENOUS
  Administered 2020-02-19: 40 ug/kg/min via INTRAVENOUS
  Administered 2020-02-19: 20 ug/kg/min via INTRAVENOUS
  Administered 2020-02-19: 40 ug/kg/min via INTRAVENOUS
  Filled 2020-02-19 (×5): qty 100

## 2020-02-19 MED ORDER — SODIUM CHLORIDE 0.9% FLUSH: 10.0000 mL | INTRAVENOUS | Status: DC | PRN

## 2020-02-19 MED ORDER — DEXTROSE 50 % IV SOLN
12.5000 g | Freq: Once | INTRAVENOUS | Status: AC
  Administered 2020-02-19: 12.5 g via INTRAVENOUS
  Filled 2020-02-19: qty 50

## 2020-02-19 MED ORDER — SODIUM CHLORIDE 0.9% FLUSH
10.0000 mL | Freq: Two times a day (BID) | INTRAVENOUS | Status: DC
  Administered 2020-02-19 – 2020-02-27 (×18): 10 mL

## 2020-02-19 NOTE — Progress Notes (Signed)
Asked to evaluate patient at bedside for low minute ventilation.  With repositioning of neck were able to get MV from 5l to 8l.  Apparently has a 6.0 uncuffed trach in regularly and in ER they were only able to get a 4.0 cuffed trach in at bedside.  Will see if we can get through the night with current trach.  The biggest issue is that her sats are 90% on FIO2 of 1.  There is little room for error or time to dilate trach site with blue rhino if unable to get new trach in.  I she decompensates further will try to place a 6.0 ET tube in place of current trach over a boogie.

## 2020-02-19 NOTE — Progress Notes (Signed)
Patient ID: [name] [name], female   DOB: 08-05-1981, 39 y.o.   MRN: [id number] With patient orally intubated, the trach site was dilated using the perc trach set dilator, and a #6 shiley cuffed trach was placed without difficulty. The oral tube was removed. Resp Therapy was in the room.  Call if any further problems arise.

## 2020-02-19 NOTE — Progress Notes (Signed)
Called back to the bedside for persistently low minute ventilation.  I sedated the patient put a bougie through the 4 oh tracheostomy tube with good and inserted a 6.5 ET tube.  It took some time to situate the tube appropriately in mid trachea.  It was then clamped and.  Confirmation of positioning was made with bronchoscope.  Large amounts of bloody mucus was removed with small pediatric scope.  Tube was clipped and patient was hooked back to the ventilator.  Positioning was good at about 11 cm on the ET tube.  She did require intermittent bagging during bronchoscopy.  Sats did intermittently drop into the high 70s on my completion the O2 sats were 90% inspiratory and expiratory tidal volumes were roughly equal.  Confirmation was made of tube placement with chest x-ray.  Patient may require either bedside Blue Rhino dilatation for a larger the ostomy to preferably 1 that is distal long for a trip to the operating room to accomplish the same.

## 2020-02-19 NOTE — Procedures (Signed)
Intubation Procedure Note  LYNNEX FULP  [id number]  12-Apr-1981  Date:02/19/20  Time:2:30 PM   Provider Performing:Malissa Slay    Procedure: Intubation (31500)  Indication(s) Respiratory Failure  Consent Risks of the procedure as well as the alternatives and risks of each were explained to the patient and/or caregiver.  Consent for the procedure was obtained and is signed in the bedside chart   Anesthesia Etomidate, Versed, Fentanyl and Rocuronium   Time Out Verified patient identification, verified procedure, site/side was marked, verified correct patient position, special equipment/implants available, medications/allergies/relevant history reviewed, required imaging and test results available.   Sterile Technique Usual hand hygeine, masks, and gloves were used   Procedure Description Patient positioned in bed supine.  Sedation given as noted above.  Patient was intubated with endotracheal tube using Glidescope.  View was Grade 1 full glottis .  Number of attempts was 1.  Colorimetric CO2 detector was consistent with tracheal placement.   Complications/Tolerance None; patient tolerated the procedure well. Chest X-ray is ordered to verify placement.   EBL None  Chesley Mires, MD [place] Pager - [phone] 02/19/2020, 2:32 PM

## 2020-02-19 NOTE — Progress Notes (Signed)
[facility] Progress Note Patient Name: [name] [name] DOB: Dec 08, 1980 MRN: [id number]   Date of Service  02/19/2020  HPI/Events of Note  RN requests [name] catheter for urine output monitoring.   eICU Interventions  [name] ordered.        Marily Lente [name] [name] 02/19/2020, 3:26 AM

## 2020-02-19 NOTE — Progress Notes (Signed)
Gave results of ABG to Dr. Mariane Masters.  Dr. Mariane Masters also changed rate on ventilator to 26.  I asked if we could take Vt back down to her 8cc of 450.  Due to ABG PO2 of 101, I took the FIO2 down to 80%.  MD wants another ABG at 0500.

## 2020-02-19 NOTE — Progress Notes (Signed)
Case d/w Dr. Constance Holster with ENT.  Orally intubated in ICU and previous ETT removed from tracheostomy stoma.  Dr. Constance Holster will arrange for upsizing of tracheostomy stoma.  Probably would be best to place #6 cuffed XLT tracheostomy.  Chesley Mires, MD [place] Pager - [phone] 02/19/2020, 2:38 PM

## 2020-02-19 NOTE — Progress Notes (Signed)
RT assist Dr. Halford Chessman with oral intubation. Pt now has 7.5 ETT Dressing was applied to stoma.

## 2020-02-19 NOTE — Progress Notes (Signed)
RT came into pt's room for noon vent check. Pt's FiO2 had been dropped from 80% to 60%. Pt tolerating change well.

## 2020-02-19 NOTE — Procedures (Signed)
Bronchoscopy Procedure Note  [name] [name]  [id number]  [birth year]  Date:02/19/20  Time:2:20 AM   Provider Performing:Tiyanna Larcom A Mariane Masters   Procedure(s):  Flexible Bronchoscopy [phone])  Indication(s) To suction any mucus and evaluate placement of ET tube and to tracheostomy site and patient that was extremely difficult to ventilate with 4 oh ET tube hypoxemia with previous attempts at inserting a 6 oh ET tube that was cuffed that failed.  Consent Unable to obtain consent due to emergent nature of procedure.  Anesthesia 50 mcg of fentanyl, 2 mg of Versed IV   Time Out Verified patient identification, verified procedure, site/side was marked, verified correct patient position, special equipment/implants available, medications/allergies/relevant history reviewed, required imaging and test results available.   Sterile Technique Usual hand hygiene, masks, gowns, and gloves were used   Procedure Description Bronchoscope advanced through endotracheal tube and into airway.  Airways were examined down to subsegmental level with findings noted below.   Following diagnostic evaluation, Therapeutic aspiration performed in All airways  Findings: ET tube was placed through the tracheostomy site as noted above over a bougie.  Difficulty with minute ventilation required fiberoptic investigation to ensure adequate placement of the ET tube.  Large amounts of bloody mucus were removed.  The saturations varied between 90% (they were 90% at the start of procedure on 100% FiO2) and 78%.  Ultimately achieved good placement of the ET tube was snapped in place after this adequate placement was once again confirmed with bronchoscope and then but chest x-ray.   Complications/Tolerance None; patient tolerated the procedure well.  At end of procedure minute ventilation was up to 10 L and inspiratory tidal volume matched expiratory tidal volume. Chest X-ray is needed post procedure.  Chest x-ray showed good  placement of ET tube.   EBL Minimal   Specimen(s) None

## 2020-02-19 NOTE — Progress Notes (Signed)
NAME:  [name] [name], MRN:  [id number], DOB:  20-Mar-1981, LOS: 1 ADMISSION DATE:  02/17/2020, CONSULTATION DATE:  02/18/20  REFERRING MD:  Dr Lorin Mercy, CHIEF COMPLAINT:  Dyspnea, cough, leg swelling   Brief History   39 yo female with OSA/OHS and chronic tracheostomy since 2017 presented to ER with dyspnea, cough and edema.  Found to have community acquired pneumonia and cellulitis.  Developed encephalopathy from worsening hypoxia/hypercapnia.  Transferred to ICU and placed on ventilator.  Past Medical History  OSA/OHS, Chronic hypoxic/hypercapnic respiratory failure, Tracheostomy status, Chronic diastolic CHF, CKD 4, DM type 2, HTN, Pulmonary hypertension, Lymphedema  Significant Hospital Events   6/28 Admit 6/29 had #6 ETT placed through trach stoma to assist with mechanical ventilation; ENT consulted to upsize trach  Consults:  ENT  Procedures:  ETT through trach stoma 6/29 >>   Significant Diagnostic Tests:   CT chest 6/28 >> bilateral basilar scattered infiltrates/atelectasis, cardiomegaly, evidence for secondary PAH  Doppler legs b/l 6/28 >> limited study, no obvious DVT  Micro Data:  SARS CoV2 6/28 >> negative MRSA PCR 6/28 >> negative Blood 6/28 >>  Sputum 6/28 >>   Antimicrobials:  Azithro 6/28 x1 Ceftriaxone 6/28 x 1 Cefepime 6/28 >>   Interim history/subjective:  Remains on increased PEEP, FiO2.  Objective   Blood pressure 131/75, pulse 84, temperature 98.4 F (36.9 C), temperature source Axillary, resp. rate (!) 26, height 5' 5" (1.651 m), weight (!) 190 kg, SpO2 100 %, unknown if currently breastfeeding.    Vent Mode: PRVC FiO2 (%):  [80 %-100 %] 80 % Set Rate:  [15 bmp-26 bmp] 26 bmp Vt Set:  [450 mL-480 mL] 450 mL PEEP:  [8 cmH20-10 cmH20] 10 cmH20 Pressure Support:  [12 cmH20] 12 cmH20 Plateau Pressure:  [14 cmH20-32 cmH20] 20 cmH20   Intake/Output Summary (Last 24 hours) at 02/19/2020 0916 Last data filed at 02/19/2020 0800 Gross per 24 hour   Intake 834.19 ml  Output 2800 ml  Net -1965.81 ml   Filed Weights   02/18/20 1400 02/19/20 0400  Weight: (!) 188 kg (!) 190 kg    Examination:  General - sedated Eyes - pupils reactive ENT - ETT through tracheostomy stoma Cardiac - regular rate/rhythm, no murmur Chest - decreased BS, b/l rhonchi Abdomen - soft, non tender, + bowel sounds Extremities - 3+ edema Skin - b/l lower leg wounds Neuro - RASS -2   Resolved Hospital Problem list     Assessment & Plan:   Acute on chronic hypoxic/hypercapnic respiratory failure 2nd to community acquired pneumonia and acute pulmonary edema in setting of OSA/OHS with chronic tracheostomy. - continue full vent support - goal SpO2 88 to 95% - f/u CXR, ABG - had initial tracheostomy placed by Dr. Redmond Baseman in 2017 >> consulted ENT to upsize trach while she is on vent - prn BDs  Acute pulmonary edema from acute on chronic diastolic CHF. Hx of HTN. - lasix 40 mg q6h x 2 doses on 6/29 - resume coreg, norvasc, hydralazine when able to take pills - hold outpt lisinopril, isosorbide  Acute metabolic encephalopathy 2nd to hypoxia/hypercapnia. - RASS goal 0 to -1  Sepsis 2nd to CAP and cellulitis with lymphedema. - day 2 of ABx - wound care  Thrombocytopenia in setting of sepsis. - f/u CBC   Best practice:  Diet: NPO DVT prophylaxis: SQ heparin GI prophylaxis: protonix Mobility: bed rest Code Status: Full Family Communication: updated pt's mother at bedside Disposition: ICU  Labs  CMP Latest Ref Rng & Units 02/19/2020 02/19/2020 02/19/2020  Glucose 70 - 99 mg/dL - - 78  BUN 6 - 20 mg/dL - - 45(H)  Creatinine 0.44 - 1.00 mg/dL - - 1.48(H)  Sodium 135 - 145 mmol/L 146(H) 146(H) 144  Potassium 3.5 - 5.1 mmol/L 4.9 5.1 5.1  Chloride 98 - 111 mmol/L - - 107  CO2 22 - 32 mmol/L - - 28  Calcium 8.9 - 10.3 mg/dL - - 8.9  Total Protein 6.0 - 8.3 g/dL - - -  Total Bilirubin 0.2 - 1.2 mg/dL - - -  Alkaline Phos 39 - 117 U/L - - -   AST 0 - 37 U/L - - -  ALT 0 - 35 U/L - - -    CBC Latest Ref Rng & Units 02/19/2020 02/19/2020 02/19/2020  WBC 4.0 - 10.5 K/uL - - 5.4  Hemoglobin 12.0 - 15.0 g/dL 13.6 13.6 12.2  Hematocrit 36 - 46 % 40.0 40.0 44.8  Platelets 150 - 400 K/uL - - 118(L)    ABG    Component Value Date/Time   PHART 7.301 (L) 02/19/2020 0613   PCO2ART 66.4 (HH) 02/19/2020 0613   PO2ART 80 (L) 02/19/2020 0613   HCO3 32.7 (H) 02/19/2020 0613   TCO2 35 (H) 02/19/2020 0613   O2SAT 94.0 02/19/2020 0613    CBG (last 3)  Recent Labs    02/19/20 0352 02/19/20 0738 02/19/20 0820  GLUCAP 72 69* 108*      Critical care time: 37 minutes  Chesley Mires, MD [place] Pager - [phone] - [phone] 02/19/2020, 9:31 AM

## 2020-02-19 NOTE — Consult Note (Signed)
Reason for Consult: Tracheostomy problem Referring Physician: Chesley Mires, MD  [name] [name] is an 39 y.o. female.  HPI: Morbidly obese patient with tracheostomy that was placed about 4 years ago.  She has been living with a number for tracheostomy but while in the hospital is having difficulty with ventilation.  The tracheostomy was removed yesterday and a 6.5 endotracheal tube was placed.  This has helped a lot of the problems with a air leak and with volumes.  Request is made to upsize.  Past Medical History:  Diagnosis Date  . Chronic diastolic heart failure ([place])    Echo 10/12: EF 50-55 // b. Echo 10/17: EF 50-55, mild BAE, PASP 42 // Echo 10/18: Mild concentric LVH, EF 55-60, normal wall motion, mild MAC, severe LAE, mild TR, PASP 15, trivial pericardial effusion  . CKD (chronic kidney disease) stage 4, GFR 15-29 ml/min (HCC) 10/25/2016   admitted in 2/18 with SCr of 10 in the setting of dehydration from illness, etc >> improved to 5 at DC  . DM2 (diabetes mellitus, type 2) (HCC)    A1c 7.2 in 05/2016  . Gestational diabetes mellitus in pregnancy 05/24/2013  . HTN in pregnancy, chronic 05/24/2013  . Hypertensive heart disease with CHF (congestive heart failure) ([place]) 05/25/2016  . Morbid obesity ([place])   . OSA (obstructive sleep apnea) 07/09/2011   s/p Trach 2/2 prolonged respiratory failure during admx for CHF in 05/2016  . Pulmonary hypertension ([place])    secondary from CHF/OSA  . Sinus tachycardia     Past Surgical History:  Procedure Laterality Date  . TRACHEOSTOMY TUBE PLACEMENT N/A 06/11/2016   Procedure: TRACHEOSTOMY;  Surgeon: Melida Quitter, MD;  Location: [facility] OR;  Service: ENT;  Laterality: N/A;    Family History  Problem Relation Age of Onset  . Hypertension Mother   . Diabetes Other   . Birth defects Paternal Grandfather        unknown type    Social History:  reports that she has never smoked. She has never used smokeless tobacco. She reports that she does not  drink alcohol and does not use drugs.  Allergies:  Allergies  Allergen Reactions  . No Known Allergies     Medications: Reviewed  Results for orders placed or performed during the hospital encounter of 02/17/20 (from the past 48 hour(s))  CBC with Differential     Status: Abnormal   Collection Time: 02/18/20 12:24 AM  Result Value Ref Range   WBC 6.4 4.0 - 10.5 K/uL   RBC 5.01 3.87 - 5.11 MIL/uL   Hemoglobin 13.6 12.0 - 15.0 g/dL   HCT 47.4 (H) 36 - 46 %   MCV 94.6 80.0 - 100.0 fL   MCH 27.1 26.0 - 34.0 pg   MCHC 28.7 (L) 30.0 - 36.0 g/dL   RDW 17.9 (H) 11.5 - 15.5 %   Platelets 155 150 - 400 K/uL   nRBC 0.6 (H) 0.0 - 0.2 %   Neutrophils Relative % 77 %   Neutro Abs 5.1 1.7 - 7.7 K/uL   Lymphocytes Relative 7 %   Lymphs Abs 0.4 (L) 0.7 - 4.0 K/uL   Monocytes Relative 11 %   Monocytes Absolute 0.7 0 - 1 K/uL   Eosinophils Relative 3 %   Eosinophils Absolute 0.2 0 - 0 K/uL   Basophils Relative 1 %   Basophils Absolute 0.0 0 - 0 K/uL   Immature Granulocytes 1 %   Abs Immature Granulocytes 0.03 0.00 - 0.07  K/uL    Comment: Performed at [facility], [place] [address], [place], [place] [address]  Basic metabolic panel     Status: Abnormal   Collection Time: 02/18/20 12:24 AM  Result Value Ref Range   Sodium 141 135 - 145 mmol/L   Potassium 5.0 3.5 - 5.1 mmol/L   Chloride 104 98 - 111 mmol/L   CO2 27 22 - 32 mmol/L   Glucose, Bld 127 (H) 70 - 99 mg/dL    Comment: Glucose reference range applies only to samples taken after fasting for at least 8 hours.   BUN 54 (H) 6 - 20 mg/dL   Creatinine, Ser 1.52 (H) 0.44 - 1.00 mg/dL   Calcium 8.9 8.9 - 10.3 mg/dL   GFR calc non Af Amer 42 (L) >60 mL/min   GFR calc Af Amer 49 (L) >60 mL/min   Anion gap 10 5 - 15    Comment: Performed at [facility] [address] [place], [place] [address]  Brain natriuretic peptide     Status: Abnormal   Collection Time: 02/18/20 12:24 AM  Result Value Ref Range   B Natriuretic  Peptide 677.8 (H) 0.0 - 100.0 pg/mL    Comment: Performed at [facility] [address], [place], [place] [address]  Troponin I (High Sensitivity)     Status: None   Collection Time: 02/18/20 12:24 AM  Result Value Ref Range   Troponin I (High Sensitivity) 9 <18 ng/L    Comment: (NOTE) Elevated high sensitivity troponin I (hsTnI) values and significant  changes across serial measurements may suggest ACS but many other  chronic and acute conditions are known to elevate hsTnI results.  Refer to the "Links" section for chest pain algorithms and additional  guidance. Performed at [facility], [place] [address] [place], [place] [address]   SARS Coronavirus 2 by RT PCR (hospital order, performed in [facility] hospital lab) Nasopharyngeal Nasopharyngeal Swab     Status: None   Collection Time: 02/18/20  2:30 AM   Specimen: Nasopharyngeal Swab  Result Value Ref Range   SARS Coronavirus 2 NEGATIVE NEGATIVE    Comment: (NOTE) SARS-CoV-2 target nucleic acids are NOT DETECTED.  The SARS-CoV-2 RNA is generally detectable in upper and lower respiratory specimens during the acute phase of infection. The lowest concentration of SARS-CoV-2 viral copies this assay can detect is 250 copies / mL. A negative result does not preclude SARS-CoV-2 infection and should not be used as the sole basis for treatment or other patient management decisions.  A negative result may occur with improper specimen collection / handling, submission of specimen other than nasopharyngeal swab, presence of viral mutation(s) within the areas targeted by this assay, and inadequate number of viral copies (<250 copies / mL). A negative result must be combined with clinical observations, patient history, and epidemiological information.  Fact Sheet for Patients:   StrictlyIdeas.no  Fact Sheet for Healthcare Providers: BankingDealers.co.za  This test is not yet  approved or  cleared by the [place] FDA and has been authorized for detection and/or diagnosis of SARS-CoV-2 by FDA under an Emergency Use Authorization (EUA).  This EUA will remain in effect (meaning this test can be used) for the duration of the COVID-19 declaration under Section 564(b)(1) of the Act, 21 U.S.C. section 360bbb-3(b)(1), unless the authorization is terminated or revoked sooner.  Performed at [facility], [place] [address] [place],  [address]   Blood culture (  routine x 2)     Status: None (Preliminary result)   Collection Time: 02/18/20  5:45 AM   Specimen: BLOOD LEFT HAND  Result Value Ref Range   Specimen Description BLOOD LEFT HAND    Special Requests      BOTTLES DRAWN AEROBIC AND ANAEROBIC Blood Culture results may not be optimal due to an inadequate volume of blood received in culture bottles   Culture      NO GROWTH 1 DAY Performed at [facility], [place] [address] [place], [place] [address]    Report Status PENDING   Blood culture (routine x 2)     Status: None (Preliminary result)   Collection Time: 02/18/20  5:50 AM   Specimen: BLOOD RIGHT HAND  Result Value Ref Range   Specimen Description BLOOD RIGHT HAND    Special Requests      BOTTLES DRAWN AEROBIC AND ANAEROBIC Blood Culture results may not be optimal due to an inadequate volume of blood received in culture bottles   Culture      NO GROWTH 1 DAY Performed at [facility], [place] [address], [place], [place] [address]    Report Status PENDING   I-Stat arterial blood gas, ED     Status: Abnormal   Collection Time: 02/18/20  8:17 AM  Result Value Ref Range   pH, Arterial 7.155 (LL) 7.35 - 7.45   pCO2 arterial 90.4 (HH) 32 - 48 mmHg   pO2, Arterial 66 (L) 83 - 108 mmHg   Bicarbonate 31.8 (H) 20.0 - 28.0 mmol/L   TCO2 35 (H) 22 - 32 mmol/L   O2 Saturation 85.0 %   Acid-Base Excess 0.0 0.0 - 2.0 mmol/L   Sodium 142 135 - 145 mmol/L   Potassium 5.1 3.5 - 5.1 mmol/L    Calcium, Ion 1.27 1.15 - 1.40 mmol/L   HCT 43.0 36 - 46 %   Hemoglobin 14.6 12.0 - 15.0 g/dL   Patient temperature 98.6 F    Collection site Brachial    Drawn by RT    Sample type ARTERIAL   MRSA PCR Screening     Status: None   Collection Time: 02/18/20  1:52 PM   Specimen: Nasal Mucosa; Nasopharyngeal  Result Value Ref Range   MRSA by PCR NEGATIVE NEGATIVE    Comment:        The GeneXpert MRSA Assay (FDA approved for NASAL specimens only), is one component of a comprehensive MRSA colonization surveillance program. It is not intended to diagnose MRSA infection nor to guide or monitor treatment for MRSA infections. Performed at [facility], [place] [address] [place], [place] [address]   Blood gas, arterial     Status: Abnormal   Collection Time: 02/18/20  3:25 PM  Result Value Ref Range   FIO2 100.00    pH, Arterial 7.163 (LL) 7.35 - 7.45    Comment: CRITICAL RESULT CALLED TO, READ BACK BY AND VERIFIED WITH: M.BUNNER RN 1546 02/18/20 MCCORMICK K    pCO2 arterial 87.7 (HH) 32 - 48 mmHg    Comment: CRITICAL RESULT CALLED TO, READ BACK BY AND VERIFIED WITH: M.BUNNER RN 1546 02/18/20 MCCORMICK K    pO2, Arterial 93.6 83 - 108 mmHg   Bicarbonate 30.2 (H) 20.0 - 28.0 mmol/L   Acid-Base Excess 2.2 (H) 0.0 - 2.0 mmol/L   O2 Saturation 95.6 %   Patient temperature 37.0    Collection site RIGHT RADIAL    Drawn by [phone]  Comment: COLLECTED BY RT   Sample type ARTERIAL DRAW    Allens test (pass/fail) PASS PASS    Comment: Performed at [facility], [place] [address] [place], [place] [address]  Glucose, capillary     Status: None   Collection Time: 02/18/20  3:36 PM  Result Value Ref Range   Glucose-Capillary 97 70 - 99 mg/dL    Comment: Glucose reference range applies only to samples taken after fasting for at least 8 hours.  Glucose, capillary     Status: None   Collection Time: 02/18/20  7:31 PM  Result Value Ref Range   Glucose-Capillary 89 70 - 99 mg/dL     Comment: Glucose reference range applies only to samples taken after fasting for at least 8 hours.  I-STAT 7, (LYTES, BLD GAS, ICA, H+H)     Status: Abnormal   Collection Time: 02/18/20 11:33 PM  Result Value Ref Range   pH, Arterial 7.229 (L) 7.35 - 7.45   pCO2 arterial 83.1 (HH) 32 - 48 mmHg   pO2, Arterial 87 83 - 108 mmHg   Bicarbonate 34.8 (H) 20.0 - 28.0 mmol/L   TCO2 37 (H) 22 - 32 mmol/L   O2 Saturation 94.0 %   Acid-Base Excess 4.0 (H) 0.0 - 2.0 mmol/L   Sodium 145 135 - 145 mmol/L   Potassium 5.3 (H) 3.5 - 5.1 mmol/L   Calcium, Ion 1.32 1.15 - 1.40 mmol/L   HCT 43.0 36 - 46 %   Hemoglobin 14.6 12.0 - 15.0 g/dL   Patient temperature 98.1 F    Collection site Radial    Drawn by RT    Sample type ARTERIAL   Glucose, capillary     Status: None   Collection Time: 02/18/20 11:47 PM  Result Value Ref Range   Glucose-Capillary 80 70 - 99 mg/dL    Comment: Glucose reference range applies only to samples taken after fasting for at least 8 hours.  Renal function panel     Status: Abnormal   Collection Time: 02/19/20  2:10 AM  Result Value Ref Range   Sodium 144 135 - 145 mmol/L   Potassium 5.1 3.5 - 5.1 mmol/L   Chloride 107 98 - 111 mmol/L   CO2 28 22 - 32 mmol/L   Glucose, Bld 78 70 - 99 mg/dL    Comment: Glucose reference range applies only to samples taken after fasting for at least 8 hours.   BUN 45 (H) 6 - 20 mg/dL   Creatinine, Ser 1.48 (H) 0.44 - 1.00 mg/dL   Calcium 8.9 8.9 - 10.3 mg/dL   Phosphorus 4.3 2.5 - 4.6 mg/dL   Albumin 2.6 (L) 3.5 - 5.0 g/dL   GFR calc non Af Amer 44 (L) >60 mL/min   GFR calc Af Amer 51 (L) >60 mL/min   Anion gap 9 5 - 15    Comment: Performed at [facility] [address], [place], [place] [address]  CBC     Status: Abnormal   Collection Time: 02/19/20  2:10 AM  Result Value Ref Range   WBC 5.4 4.0 - 10.5 K/uL   RBC 4.66 3.87 - 5.11 MIL/uL   Hemoglobin 12.2 12.0 - 15.0 g/dL   HCT 44.8 36 - 46 %   MCV 96.1 80.0 - 100.0 fL    MCH 26.2 26.0 - 34.0 pg   MCHC 27.2 (L) 30.0 - 36.0 g/dL   RDW 17.9 (H) 11.5 - 15.5 %  Platelets 118 (L) 150 - 400 K/uL    Comment: SPECIMEN CHECKED FOR CLOTS Immature Platelet Fraction may be clinically indicated, consider ordering this additional test [id number] PLATELET COUNT CONFIRMED BY SMEAR REPEATED TO VERIFY    nRBC 1.8 (H) 0.0 - 0.2 %    Comment: Performed at [facility], [place] [address], [place], [place] [address]  Triglycerides     Status: None   Collection Time: 02/19/20  2:10 AM  Result Value Ref Range   Triglycerides 89 <150 mg/dL    Comment: Performed at [facility] [address] [place], [place] [address]  I-STAT 7, (LYTES, BLD GAS, ICA, H+H)     Status: Abnormal   Collection Time: 02/19/20  3:00 AM  Result Value Ref Range   pH, Arterial 7.244 (L) 7.35 - 7.45   pCO2 arterial 78.3 (HH) 32 - 48 mmHg   pO2, Arterial 101 83 - 108 mmHg   Bicarbonate 33.9 (H) 20.0 - 28.0 mmol/L   TCO2 36 (H) 22 - 32 mmol/L   O2 Saturation 96.0 %   Acid-Base Excess 4.0 (H) 0.0 - 2.0 mmol/L   Sodium 146 (H) 135 - 145 mmol/L   Potassium 5.1 3.5 - 5.1 mmol/L   Calcium, Ion 1.31 1.15 - 1.40 mmol/L   HCT 40.0 36 - 46 %   Hemoglobin 13.6 12.0 - 15.0 g/dL   Patient temperature 98.5 F    Collection site Radial    Drawn by RT    Sample type ARTERIAL   Glucose, capillary     Status: None   Collection Time: 02/19/20  3:52 AM  Result Value Ref Range   Glucose-Capillary 72 70 - 99 mg/dL    Comment: Glucose reference range applies only to samples taken after fasting for at least 8 hours.  I-STAT 7, (LYTES, BLD GAS, ICA, H+H)     Status: Abnormal   Collection Time: 02/19/20  6:13 AM  Result Value Ref Range   pH, Arterial 7.301 (L) 7.35 - 7.45   pCO2 arterial 66.4 (HH) 32 - 48 mmHg   pO2, Arterial 80 (L) 83 - 108 mmHg   Bicarbonate 32.7 (H) 20.0 - 28.0 mmol/L   TCO2 35 (H) 22 - 32 mmol/L   O2 Saturation 94.0 %   Acid-Base Excess 4.0 (H) 0.0 - 2.0 mmol/L   Sodium 146 (H)  135 - 145 mmol/L   Potassium 4.9 3.5 - 5.1 mmol/L   Calcium, Ion 1.28 1.15 - 1.40 mmol/L   HCT 40.0 36 - 46 %   Hemoglobin 13.6 12.0 - 15.0 g/dL   Patient temperature 98.5 F    Collection site Radial    Drawn by RT    Sample type ARTERIAL   Glucose, capillary     Status: Abnormal   Collection Time: 02/19/20  7:38 AM  Result Value Ref Range   Glucose-Capillary 69 (L) 70 - 99 mg/dL    Comment: Glucose reference range applies only to samples taken after fasting for at least 8 hours.  Glucose, capillary     Status: Abnormal   Collection Time: 02/19/20  8:20 AM  Result Value Ref Range   Glucose-Capillary 108 (H) 70 - 99 mg/dL    Comment: Glucose reference range applies only to samples taken after fasting for at least 8 hours.  Glucose, capillary     Status: None   Collection Time: 02/19/20 11:15 AM  Result Value Ref Range   Glucose-Capillary 70 70 - 99 mg/dL  Comment: Glucose reference range applies only to samples taken after fasting for at least 8 hours.    CT Chest W Contrast  Result Date: 02/18/2020 CLINICAL DATA:  Increased shortness of breath. EXAM: CT CHEST WITH CONTRAST TECHNIQUE: Multidetector CT imaging of the chest was performed during intravenous contrast administration. CONTRAST:  61m OMNIPAQUE IOHEXOL 300 MG/ML  SOLN COMPARISON:  08/06/2019 chest CTA FINDINGS: Cardiovascular: Cardiomegaly and enlarged main pulmonary artery a 5.2 cm. There is known pulmonary hypertension. No acute vascular finding. No pericardial effusion. Mediastinum/Nodes: No noted adenopathy.  Tracheostomy tube in place. Lungs/Pleura: Patchy streaky and consolidative opacities mainly in the lower lobes. No edema, effusion, or pneumothorax. Mosaic attenuation of lungs in areas not affected by consolidation, history of pulmonary hypertension suggesting small airways disease. Upper Abdomen: No acute finding Musculoskeletal: Spondylosis with multi-level ankylosis. IMPRESSION: 1. Pneumonia/atelectasis in the  dependent lungs. 2. Cardiomegaly and pulmonary hypertension. Electronically Signed   By: JMonte FantasiaM.D.   On: 02/18/2020 05:12   DG CHEST PORT 1 VIEW  Result Date: 02/19/2020 CLINICAL DATA:  History of endotracheal tube. EXAM: PORTABLE CHEST 1 VIEW COMPARISON:  Radiograph and CT yesterday. FINDINGS: Tracheostomy tube tip at the thoracic inlet. Stable cardiomegaly. Patchy bibasilar opacities not significantly changed by radiograph. No evidence of pneumothorax or large pleural effusion. Soft tissue attenuation from habitus limits assessment. IMPRESSION: 1. Tracheostomy tube tip at the thoracic inlet. 2. Stable cardiomegaly. 3. Patchy bibasilar opacities not significantly changed since yesterday. Electronically Signed   By: MKeith RakeM.D.   On: 02/19/2020 02:27   DG Chest Port 1 View  Result Date: 02/18/2020 CLINICAL DATA:  Tracheostomy replacement EXAM: PORTABLE CHEST 1 VIEW COMPARISON:  Earlier same day FINDINGS: Tracheostomy device overlies the trachea. Low lung volumes with patchy bilateral opacities, greatest at the lung bases. No significant pleural effusion. Cardiomegaly. IMPRESSION: Tracheostomy device is present. Low lung volumes with patchy bilateral atelectasis/consolidation, greatest at the lung bases. Electronically Signed   By: PMacy MisM.D.   On: 02/18/2020 09:06   DG Chest Portable 1 View  Result Date: 02/18/2020 CLINICAL DATA:  Shortness of breath. EXAM: PORTABLE CHEST 1 VIEW COMPARISON:  08/10/2019 FINDINGS: Tracheostomy tube tip at the thoracic inlet. Chronic cardiomegaly. Interstitial thickening suspicious for pulmonary edema, similar to prior. Poor definition the lung bases which may be due to soft tissue attenuation or pleural effusions/airspace disease. No pneumothorax. IMPRESSION: 1. Chronic cardiomegaly with interstitial thickening suspicious for pulmonary edema. 2. Poor definition of the lung bases which may be due to soft tissue attenuation or pleural  effusions/airspace disease. 3. Tracheostomy tube tip at the thoracic inlet. Electronically Signed   By: MKeith RakeM.D.   On: 02/18/2020 00:58   VAS [place]OWER EXTREMITY VENOUS (DVT)  Result Date: 02/19/2020  Lower Venous DVTStudy Indications: Edema.  Limitations: Morbid obesity/ equipment interference. Comparison Study: 08/07/19 limited- negative Performing Technologist: JJune LeapRDMS, RVT  Examination Guidelines: A complete evaluation includes B-mode imaging, spectral Doppler, color Doppler, and power Doppler as needed of all accessible portions of each vessel. Bilateral testing is considered an integral part of a complete examination. Limited examinations for reoccurring indications may be performed as noted. The reflux portion of the exam is performed with the patient in reverse Trendelenburg.  +---------+---------------+---------+-----------+----------+--------------+ RIGHT    CompressibilityPhasicitySpontaneityPropertiesThrombus Aging +---------+---------------+---------+-----------+----------+--------------+ CFV      Full           Yes      Yes                                 +---------+---------------+---------+-----------+----------+--------------+  SFJ                                                   Not visualized +---------+---------------+---------+-----------+----------+--------------+ FV Prox  Full                                                        +---------+---------------+---------+-----------+----------+--------------+ FV Mid   Full                                                        +---------+---------------+---------+-----------+----------+--------------+ FV Distal                                             Not visualized +---------+---------------+---------+-----------+----------+--------------+ PFV                                                   Not visualized  +---------+---------------+---------+-----------+----------+--------------+ POP      Full           Yes      Yes                                 +---------+---------------+---------+-----------+----------+--------------+ PTV      Full                                                        +---------+---------------+---------+-----------+----------+--------------+ PERO                                                  Not visualized +---------+---------------+---------+-----------+----------+--------------+   +---------+---------------+---------+-----------+----------+--------------+ LEFT     CompressibilityPhasicitySpontaneityPropertiesThrombus Aging +---------+---------------+---------+-----------+----------+--------------+ CFV      Full           Yes      Yes                                 +---------+---------------+---------+-----------+----------+--------------+ SFJ                                                   Not visualized +---------+---------------+---------+-----------+----------+--------------+ FV Prox  Full                                                        +---------+---------------+---------+-----------+----------+--------------+  FV Mid                                                Not visualized +---------+---------------+---------+-----------+----------+--------------+ FV Distal                                             Not visualized +---------+---------------+---------+-----------+----------+--------------+ PFV                                                   Not visualized +---------+---------------+---------+-----------+----------+--------------+ POP      Full           Yes      Yes                                 +---------+---------------+---------+-----------+----------+--------------+ PTV                                                   Not visualized  +---------+---------------+---------+-----------+----------+--------------+ PERO                                                  Not visualized +---------+---------------+---------+-----------+----------+--------------+     Summary: RIGHT: - Limited study. Most segments not visualized. No obvious evidence of deep vein thrombosis.  LEFT: - Limited study. Most segments not visualized. No obvious evidence of deep vein thrombosis.  *See table(s) above for measurements and observations. Electronically signed by Deitra Mayo MD on 02/19/2020 at 8:53:08 AM.    Final     EFE:[id number] except as listed in admit H&P  Blood pressure 128/67, pulse 86, temperature 98.1 F (36.7 C), temperature source Axillary, resp. rate (!) 26, height _0  (1.651 m), weight (!) 190 kg, SpO2 94 %, unknown if currently breastfeeding.  PHYSICAL EXAM: Overall appearance: Morbidly obese lady, sedated, on ventilator with tracheostomy in place Head:  Normocephalic, atraumatic. Ears: External ears look healthy. Nose: External nose is healthy in appearance. Internal nasal exam free of any lesions or obstruction. Oral Cavity/Pharynx:  There are no mucosal lesions or masses identified. Larynx/Hypopharynx: Deferred Neuro: Patient is sedated. Neck: Endotracheal tube in place in the tracheostomy site.  Studies Reviewed: none  Procedures: none   Assessment/Plan: Given the poor respiratory function I would recommend that we orally intubate her and under heavy sedation I can attempt to upsize and dilate up to either 6 or 8 at the discretion of the critical care physicians.  I will try to coordinate this for later today with pulmonary CCM.  Izora Gala 02/19/2020, 12:18 PM

## 2020-02-19 NOTE — Progress Notes (Signed)
Hypoglycemic Event  CBG: 69  Treatment: 12.5 GM   Symptoms: None Follow-up CBG: Time: 0820 CBG Result:108  Possible Reasons for Event: NPO     [name] [name]

## 2020-02-19 NOTE — Progress Notes (Signed)
Assisted Dr. Mariane Masters, along with Karna Christmas, RRT with changing #4 Shiley cuffed trach to a 6.5 ETT in stoma.  Patient has had consistent cuff leaks and not getting her volumes and appropriate MVe as well.  Placement of ETT is currently at 11cm and was verified by Bronchoscopy as well as CXR.  Patient is currently getting the best O2 sats, MVe and volumes than she has had all night.Will continue to monitor patient.

## 2020-02-20 ENCOUNTER — Inpatient Hospital Stay (HOSPITAL_COMMUNITY): Payer: Medicare Other

## 2020-02-20 ENCOUNTER — Inpatient Hospital Stay (HOSPITAL_COMMUNITY): Admission: RE | Admit: 2020-02-20 | Payer: Medicare Other | Source: Ambulatory Visit

## 2020-02-20 LAB — GLUCOSE, CAPILLARY
Glucose-Capillary: 70 mg/dL (ref 70–99)
Glucose-Capillary: 70 mg/dL (ref 70–99)
Glucose-Capillary: 95 mg/dL (ref 70–99)
Glucose-Capillary: 81 mg/dL (ref 70–99)
Glucose-Capillary: 86 mg/dL (ref 70–99)
Glucose-Capillary: 81 mg/dL (ref 70–99)

## 2020-02-20 LAB — BASIC METABOLIC PANEL
Anion gap: 14 (ref 5–15)
BUN: 27 mg/dL — ABNORMAL HIGH (ref 6–20)
CO2: 29 mmol/L (ref 22–32)
Calcium: 9 mg/dL (ref 8.9–10.3)
Chloride: 104 mmol/L (ref 98–111)
Creatinine, Ser: 1.23 mg/dL — ABNORMAL HIGH (ref 0.44–1.00)
GFR calc Af Amer: >60 mL/min (ref >60)
GFR calc non Af Amer: 55 mL/min — ABNORMAL LOW (ref >60)
Glucose, Bld: 77 mg/dL (ref 70–99)
Potassium: 3.8 mmol/L (ref 3.5–5.1)
Sodium: 147 mmol/L — ABNORMAL HIGH (ref 135–145)

## 2020-02-20 LAB — POCT I-STAT 7, (LYTES, BLD GAS, ICA,H+H)
Acid-Base Excess: 12 mmol/L — ABNORMAL HIGH (ref 0.0–2.0)
Bicarbonate: 36.8 mmol/L — ABNORMAL HIGH (ref 20.0–28.0)
Calcium, Ion: 1.23 mmol/L (ref 1.15–1.40)
HCT: 41 % (ref 36.0–46.0)
Hemoglobin: 13.9 g/dL (ref 12.0–15.0)
O2 Saturation: 95 %
Patient temperature: 100.1
Potassium: 3.7 mmol/L (ref 3.5–5.1)
Sodium: 144 mmol/L (ref 135–145)
TCO2: 38 mmol/L — ABNORMAL HIGH (ref 22–32)
pCO2 arterial: 50.8 mmHg — ABNORMAL HIGH (ref 32.0–48.0)
pH, Arterial: 7.472 — ABNORMAL HIGH (ref 7.350–7.450)
pO2, Arterial: 76 mmHg — ABNORMAL LOW (ref 83.0–108.0)

## 2020-02-20 LAB — CBC
HCT: 44 % (ref 36.0–46.0)
Hemoglobin: 13.2 g/dL (ref 12.0–15.0)
MCH: 26.9 pg (ref 26.0–34.0)
MCHC: 30 g/dL (ref 30.0–36.0)
MCV: 89.6 fL (ref 80.0–100.0)
Platelets: 113 10*3/uL — ABNORMAL LOW (ref 150–400)
RBC: 4.91 MIL/uL (ref 3.87–5.11)
RDW: 17.7 % — ABNORMAL HIGH (ref 11.5–15.5)
WBC: 5.2 10*3/uL (ref 4.0–10.5)
nRBC: 1 % — ABNORMAL HIGH (ref 0.0–0.2)

## 2020-02-20 MED ORDER — ACETAMINOPHEN 160 MG/5ML PO SOLN
650.0000 mg | ORAL | Status: DC | PRN
  Administered 2020-02-21: 650 mg
  Filled 2020-02-20: qty 20.3

## 2020-02-20 MED ORDER — FUROSEMIDE 10 MG/ML IJ SOLN
40.0000 mg | Freq: Four times a day (QID) | INTRAMUSCULAR | Status: AC
  Administered 2020-02-20 (×2): 40 mg via INTRAVENOUS
  Filled 2020-02-20 (×2): qty 4

## 2020-02-20 NOTE — Progress Notes (Signed)
NAME:  [name] [name], MRN:  [id number], DOB:  [birth year], LOS: 2 ADMISSION DATE:  02/17/2020, CONSULTATION DATE:  02/18/20  REFERRING MD:  Dr Lorin Mercy, CHIEF COMPLAINT:  Dyspnea, cough, leg swelling   Brief History   39 yo female with OSA/OHS and chronic tracheostomy since 2017 presented to ER with dyspnea, cough and edema.  Found to have community acquired pneumonia and cellulitis.  Developed encephalopathy from worsening hypoxia/hypercapnia.  Transferred to ICU and placed on ventilator.  Past Medical History  OSA/OHS, Chronic hypoxic/hypercapnic respiratory failure, Tracheostomy status, Chronic diastolic CHF, CKD 4, DM type 2, HTN, Pulmonary hypertension, Lymphedema  Significant Hospital Events   6/28 Admit 6/29 had #6 ETT placed through trach stoma to assist with mechanical ventilation; ENT consulted to upsize trach  Consults:  ENT  Procedures:  ETT through trach stoma 6/29 >> 6/29 #6 cuffed trach 6/29 >>   Significant Diagnostic Tests:   CT chest 6/28 >> bilateral basilar scattered infiltrates/atelectasis, cardiomegaly, evidence for secondary PAH  Doppler legs b/l 6/28 >> limited study, no obvious DVT  Micro Data:  SARS CoV2 6/28 >> negative MRSA PCR 6/28 >> negative Blood 6/28 >>  Sputum 6/28 >>   Antimicrobials:  Azithro 6/28 x1 Ceftriaxone 6/28 x 1 Cefepime 6/28 >>   Interim history/subjective:  Tolerating some pressure support.  Objective   Blood pressure 138/79, pulse 98, temperature 98.4 F (36.9 C), temperature source Oral, resp. rate (!) 25, height _0  (1.651 m), weight (!) 182 kg, SpO2 90 %, unknown if currently breastfeeding.    Vent Mode: CPAP;PSV FiO2 (%):  [50 %-80 %] 50 % Set Rate:  [26 bmp] 26 bmp Vt Set:  [450 mL] 450 mL PEEP:  [10 cmH20] 10 cmH20 Pressure Support:  [15 cmH20] 15 cmH20 Plateau Pressure:  [25 cmH20-32 cmH20] 27 cmH20   Intake/Output Summary (Last 24 hours) at 02/20/2020 0754 Last data filed at 02/20/2020 0600 Gross per 24 hour   Intake 1324.89 ml  Output 8975 ml  Net -7650.11 ml   Filed Weights   02/18/20 1400 02/19/20 0400 02/20/20 0400  Weight: (!) 188 kg (!) 190 kg (!) 182 kg    Examination:  General - alert Eyes - pupils reactive ENT - trach site clean Cardiac - regular rate/rhythm, no murmur Chest - better air movement, no wheeze Abdomen - soft, non tender, + bowel sounds Extremities - lower extremity lympedema Skin - Lt leg in wrap Neuro - RASS 0   Resolved Hospital Problem list     Assessment & Plan:   Acute on chronic hypoxic/hypercapnic respiratory failure 2nd to community acquired pneumonia and acute pulmonary edema in setting of OSA/OHS with chronic tracheostomy. - pressure support wean as able - goal SpO2 88 to 95% - f/u CXR - prn BDs - might need to set up nocturnal ventilation as outpt  Acute pulmonary edema from acute on chronic diastolic CHF. Hx of HTN. - lasix 40 mg IV q6h x 2 on 6/30 - resume coreg, norvasc, hydralazine when able to take pills - hold outpt lisinopril, isosorbide  Acute metabolic encephalopathy 2nd to hypoxia/hypercapnia. - RASS goal 0 to -1  Sepsis 2nd to CAP and cellulitis with lymphedema. - day 3 of ABx - wound care  Thrombocytopenia in setting of sepsis. - f/u CBC  Depression. - add SSRI when able to take pills  Nutrition. - pt requested to hold off on placing NG tube for now and see if she can wean off vent soon and then start eating -  continue dextrose in IV fluid   Best practice:  Diet: NPO DVT prophylaxis: SQ heparin GI prophylaxis: protonix Mobility: bed rest Code Status: Full Disposition: ICU  Labs    CMP Latest Ref Rng & Units 02/20/2020 02/19/2020 02/19/2020  Glucose 70 - 99 mg/dL - - -  BUN 6 - 20 mg/dL - - -  Creatinine 0.44 - 1.00 mg/dL - - -  Sodium 135 - 145 mmol/L 144 146(H) 146(H)  Potassium 3.5 - 5.1 mmol/L 3.7 4.9 5.1  Chloride 98 - 111 mmol/L - - -  CO2 22 - 32 mmol/L - - -  Calcium 8.9 - 10.3 mg/dL - - -    Total Protein 6.0 - 8.3 g/dL - - -  Total Bilirubin 0.2 - 1.2 mg/dL - - -  Alkaline Phos 39 - 117 U/L - - -  AST 0 - 37 U/L - - -  ALT 0 - 35 U/L - - -    CBC Latest Ref Rng & Units 02/20/2020 02/19/2020 02/19/2020  WBC 4.0 - 10.5 K/uL - - -  Hemoglobin 12.0 - 15.0 g/dL 13.9 13.6 13.6  Hematocrit 36 - 46 % 41.0 40.0 40.0  Platelets 150 - 400 K/uL - - -    ABG    Component Value Date/Time   PHART 7.472 (H) 02/20/2020 0501   PCO2ART 50.8 (H) 02/20/2020 0501   PO2ART 76 (L) 02/20/2020 0501   HCO3 36.8 (H) 02/20/2020 0501   TCO2 38 (H) 02/20/2020 0501   O2SAT 95.0 02/20/2020 0501    CBG (last 3)  Recent Labs    02/19/20 2337 02/20/20 0326 02/20/20 0745  GLUCAP 79 70 70      Signature:  Chesley Mires, MD [name] Pager - [phone] 02/20/2020, 7:54 AM

## 2020-02-20 NOTE — Progress Notes (Signed)
This note also relates to the following rows which could not be included: SpO2 - Cannot attach notes to unvalidated device data  Pt is alert, RT asked pt if she wanted to be suctioned. Pt stated not right now.

## 2020-02-21 ENCOUNTER — Inpatient Hospital Stay (HOSPITAL_COMMUNITY): Admission: RE | Admit: 2020-02-21 | Payer: Medicare Other | Source: Ambulatory Visit

## 2020-02-21 ENCOUNTER — Inpatient Hospital Stay (HOSPITAL_COMMUNITY): Payer: Medicare Other

## 2020-02-21 LAB — CBC
HCT: 46.9 % — ABNORMAL HIGH (ref 36.0–46.0)
Hemoglobin: 14.6 g/dL (ref 12.0–15.0)
MCH: 27.1 pg (ref 26.0–34.0)
MCHC: 31.1 g/dL (ref 30.0–36.0)
MCV: 87 fL (ref 80.0–100.0)
Platelets: 136 10*3/uL — ABNORMAL LOW (ref 150–400)
RBC: 5.39 MIL/uL — ABNORMAL HIGH (ref 3.87–5.11)
RDW: 17.9 % — ABNORMAL HIGH (ref 11.5–15.5)
WBC: 5.2 10*3/uL (ref 4.0–10.5)
nRBC: 0 % (ref 0.0–0.2)

## 2020-02-21 LAB — GLUCOSE, CAPILLARY
Glucose-Capillary: 85 mg/dL (ref 70–99)
Glucose-Capillary: 86 mg/dL (ref 70–99)
Glucose-Capillary: 98 mg/dL (ref 70–99)
Glucose-Capillary: 123 mg/dL — ABNORMAL HIGH (ref 70–99)
Glucose-Capillary: 157 mg/dL — ABNORMAL HIGH (ref 70–99)
Glucose-Capillary: 121 mg/dL — ABNORMAL HIGH (ref 70–99)

## 2020-02-21 LAB — BASIC METABOLIC PANEL
Anion gap: 14 (ref 5–15)
BUN: 20 mg/dL (ref 6–20)
CO2: 33 mmol/L — ABNORMAL HIGH (ref 22–32)
Calcium: 8.9 mg/dL (ref 8.9–10.3)
Chloride: 97 mmol/L — ABNORMAL LOW (ref 98–111)
Creatinine, Ser: 1.2 mg/dL — ABNORMAL HIGH (ref 0.44–1.00)
GFR calc Af Amer: >60 mL/min (ref >60)
GFR calc non Af Amer: 56 mL/min — ABNORMAL LOW (ref >60)
Glucose, Bld: 85 mg/dL (ref 70–99)
Potassium: 3.4 mmol/L — ABNORMAL LOW (ref 3.5–5.1)
Sodium: 144 mmol/L (ref 135–145)

## 2020-02-21 LAB — MAGNESIUM: Magnesium: 1.6 mg/dL — ABNORMAL LOW (ref 1.7–2.4)

## 2020-02-21 MED ORDER — CARVEDILOL 25 MG PO TABS
25.0000 mg | ORAL_TABLET | Freq: Two times a day (BID) | ORAL | Status: DC
  Administered 2020-02-21 – 2020-02-27 (×12): 25 mg via ORAL
  Filled 2020-02-21 (×12): qty 1

## 2020-02-21 MED ORDER — MAGNESIUM SULFATE 4 GM/100ML IV SOLN
4.0000 g | Freq: Once | INTRAVENOUS | Status: AC
  Administered 2020-02-21: 4 g via INTRAVENOUS
  Filled 2020-02-21: qty 100

## 2020-02-21 MED ORDER — ISOSORBIDE DINITRATE 30 MG PO TABS
30.0000 mg | ORAL_TABLET | Freq: Three times a day (TID) | ORAL | Status: DC
  Administered 2020-02-21 – 2020-02-27 (×19): 30 mg via ORAL
  Filled 2020-02-21 (×22): qty 1

## 2020-02-21 MED ORDER — POTASSIUM CHLORIDE 10 MEQ/100ML IV SOLN
10.0000 meq | INTRAVENOUS | Status: DC
  Administered 2020-02-21: 10 meq via INTRAVENOUS
  Filled 2020-02-21: qty 100

## 2020-02-21 MED ORDER — ORAL CARE MOUTH RINSE
15.0000 mL | Freq: Two times a day (BID) | OROMUCOSAL | Status: DC
  Administered 2020-02-22 – 2020-02-25 (×7): 15 mL via OROMUCOSAL

## 2020-02-21 MED ORDER — FUROSEMIDE 10 MG/ML IJ SOLN
40.0000 mg | Freq: Four times a day (QID) | INTRAMUSCULAR | Status: AC
  Administered 2020-02-21 (×2): 40 mg via INTRAVENOUS
  Filled 2020-02-21 (×2): qty 4

## 2020-02-21 MED ORDER — HYDRALAZINE HCL 50 MG PO TABS
100.0000 mg | ORAL_TABLET | Freq: Three times a day (TID) | ORAL | Status: DC
  Administered 2020-02-21 – 2020-02-27 (×16): 100 mg via ORAL
  Filled 2020-02-21 (×18): qty 2

## 2020-02-21 MED ORDER — AMLODIPINE BESYLATE 10 MG PO TABS
10.0000 mg | ORAL_TABLET | Freq: Every day | ORAL | Status: DC
  Administered 2020-02-21 – 2020-02-26 (×6): 10 mg via ORAL
  Filled 2020-02-21 (×6): qty 1

## 2020-02-21 MED ORDER — PANTOPRAZOLE SODIUM 40 MG PO TBEC
40.0000 mg | DELAYED_RELEASE_TABLET | Freq: Every day | ORAL | Status: DC
  Administered 2020-02-22 – 2020-02-26 (×5): 40 mg via ORAL
  Filled 2020-02-21 (×5): qty 1

## 2020-02-21 MED ORDER — POTASSIUM CHLORIDE CRYS ER 20 MEQ PO TBCR
40.0000 meq | EXTENDED_RELEASE_TABLET | Freq: Two times a day (BID) | ORAL | Status: DC
  Administered 2020-02-21 – 2020-02-24 (×8): 40 meq via ORAL
  Filled 2020-02-21 (×9): qty 2

## 2020-02-21 NOTE — Plan of Care (Signed)
  Problem: Activity: Goal: Ability to tolerate increased activity will improve Outcome: Progressing Note: Pt has been working with PT. Will attempt to get up to bari-chair today    Problem: Nutritional: Goal: Maintenance of adequate nutrition will improve Note: Pt has refused NG/Cortrak. Will address speech eval with MD during rounds

## 2020-02-21 NOTE — Progress Notes (Signed)
Pharmacy Electrolyte Replacement  Recent Labs:  Recent Labs    02/19/20 0210 02/19/20 0300 02/21/20 0335  K 5.1   < > 3.4*  MG  --   --  1.6*  PHOS 4.3  --   --   CREATININE 1.48*   < > 1.20*   < > = values in this interval not displayed.    Low Critical Values (K </= 2.5, Phos </= 1, Mg </= 1) Present: None  Plan: KCl IV 10 mEq x 4 Mg 4 g x 1  Barth Kirks, PharmD, BCPS, BCCCP Clinical Pharmacist [phone]  Please check AMION for all [facility] numbers  02/21/2020 8:02 AM

## 2020-02-21 NOTE — Evaluation (Signed)
Physical Therapy Evaluation Patient Details Name: [name] [name] MRN: [id number] DOB: 04/18/1981 Today's Date: 02/21/2020   History of Present Illness  Pt is a 39 year old woman admitted on 02/18/20 with CAP and cellulitis. Developed AMS and acute respiratory failure requiring ventilation. PMH: morbid obesity, chronic trach, CKD II, CHF, DM, HTN, pulmonary HTN, lymphedema.  Clinical Impression  Pt admitted with/for respiratory failure and AMS.  Pt has improved, but is not at baseline, needing some supplementary oxygen and needing minimal to min guard assist for mobility .  Pt currently limited functionally due to the problems listed below.  (see problems list.)  Pt will benefit from PT to maximize function and safety to be able to get home safely with available assist.     Follow Up Recommendations Home health PT    Equipment Recommendations  Other (comment) (TBA)    Recommendations for Other Services       Precautions / Restrictions Precautions Precaution Comments: watch 02      Mobility  Bed Mobility Overal bed mobility: Needs Assistance Bed Mobility: Supine to Sit     Supine to sit: Min assist     General bed mobility comments: pulled up on therapist's hand  Transfers Overall transfer level: Needs assistance   Transfers: Sit to/from Stand;Stand Pivot Transfers Sit to Stand: Min guard;+2 safety/equipment Stand pivot transfers: Min guard;+2 safety/equipment       General transfer comment: min guard for safety and lines  Ambulation/Gait             General Gait Details: pivot steps over 5-6 feet only  Stairs            Wheelchair Mobility    Modified Rankin (Stroke Patients Only)       Balance Overall balance assessment: Needs assistance   Sitting balance-Leahy Scale: Good       Standing balance-Leahy Scale: Fair                               Pertinent Vitals/Pain Pain Assessment: No/denies pain    Home Living  Family/patient expects to be discharged to:: Private residence Living Arrangements: Parent;Children (parents and 50 year old daughter) Available Help at Discharge: Family;Available 24 hours/day Type of Home: House Home Access: Stairs to enter Entrance Stairs-Rails: Can reach both Entrance Stairs-Number of Steps: 4 Home Layout: One level Home Equipment: None      Prior Function Level of Independence: Independent         Comments: walks distances without difficulty     Hand Dominance   Dominant Hand: Right    Extremity/Trunk Assessment   Upper Extremity Assessment Upper Extremity Assessment: Overall WFL for tasks assessed    Lower Extremity Assessment Lower Extremity Assessment: Overall WFL for tasks assessed    Cervical / Trunk Assessment Cervical / Trunk Assessment: Normal  Communication   Communication: Tracheostomy  Cognition Arousal/Alertness: Awake/alert Behavior During Therapy: WFL for tasks assessed/performed Overall Cognitive Status: Within Functional Limits for tasks assessed                                        General Comments      Exercises     Assessment/Plan    PT Assessment Patient needs continued PT services  PT Problem List Decreased activity tolerance;Decreased mobility;Decreased balance;Cardiopulmonary status limiting activity;Obesity  PT Treatment Interventions Gait training;Functional mobility training;Therapeutic activities;Balance training;Patient/family education;DME instruction    PT Goals (Current goals can be found in the Care Plan section)  Acute Rehab PT Goals Patient Stated Goal: return home to her daughter PT Goal Formulation: With patient Time For Goal Achievement: 03/06/20 Potential to Achieve Goals: Good    Frequency Min 3X/week   Barriers to discharge        Co-evaluation PT/OT/SLP Co-Evaluation/Treatment: Yes Reason for Co-Treatment: For patient/therapist safety PT goals addressed  during session: Mobility/safety with mobility OT goals addressed during session: ADL's and self-care       AM-PAC PT "6 Clicks" Mobility  Outcome Measure Help needed turning from your back to your side while in a flat bed without using bedrails?: A Little Help needed moving from lying on your back to sitting on the side of a flat bed without using bedrails?: A Little Help needed moving to and from a bed to a chair (including a wheelchair)?: A Little Help needed standing up from a chair using your arms (e.g., wheelchair or bedside chair)?: A Little Help needed to walk in hospital room?: A Little Help needed climbing 3-5 steps with a railing? : A Little 6 Click Score: 18    End of Session   Activity Tolerance: Patient tolerated treatment well Patient left: in chair;with call bell/phone within reach;with chair alarm set Nurse Communication: Mobility status PT Visit Diagnosis: Unsteadiness on feet (R26.81);Other abnormalities of gait and mobility (R26.89);Difficulty in walking, not elsewhere classified (R26.2)    Time: 4982-6415 PT Time Calculation (min) (ACUTE ONLY): 29 min   Charges:   PT Evaluation $PT Eval Moderate Complexity: 1 Mod          02/21/2020  Ginger Carne., PT Acute Rehabilitation Services [phone]  (pager) [phone]  (office)  Tessie Fass Sadie Hazelett 02/21/2020, 6:10 PM

## 2020-02-21 NOTE — Progress Notes (Signed)
RN had conversation with mother outside of pts room. Mother expressed concerns about patient being very withdrawn from seeing family and helping herself. Mother states that she used to get out of bed when cousins would come to visit, but now she stays in her room, in bed, and doesn't even let cousins into house when they come over. Mother had to force patient to come into the ED this admission because patient kept saying "I'm fine." Mother wants patient to go to a physical therapy rehab facility when ready to discharge in hope that it will motivate her to become more active in her own care.

## 2020-02-21 NOTE — Progress Notes (Signed)
NAME:  [name] [name], MRN:  [id number], DOB:  Sep 07, 1980, LOS: 3 ADMISSION DATE:  02/17/2020, CONSULTATION DATE:  02/18/20  REFERRING MD:  Dr [name] [name], CHIEF COMPLAINT:  Dyspnea, cough, leg swelling   Brief History   39 yo female with OSA/OHS and chronic tracheostomy since 2017 presented to ER with dyspnea, cough and edema.  Found to have community acquired pneumonia and cellulitis.  Developed encephalopathy from worsening hypoxia/hypercapnia.  Transferred to ICU and placed on ventilator.  Past Medical History  OSA/OHS, Chronic hypoxic/hypercapnic respiratory failure, Tracheostomy status, Chronic diastolic CHF, CKD 4, DM type 2, HTN, Pulmonary hypertension, Lymphedema  Significant Hospital Events   6/28 Admit 6/29 had #6 ETT placed through trach stoma to assist with mechanical ventilation; ENT consulted to upsize trach 7/1: Starting diet.  Physically feeling better.  Starting to mobilize. Consults:  ENT  Procedures:  ETT through trach stoma 6/29 >> 6/29 #6 cuffed trach 6/29 >>   Significant Diagnostic Tests:   CT chest 6/28 >> bilateral basilar scattered infiltrates/atelectasis, cardiomegaly, evidence for secondary PAH  Doppler legs b/l 6/28 >> limited study, no obvious DVT  Micro Data:  SARS CoV2 6/28 >> negative MRSA PCR 6/28 >> negative Blood 6/28 >>  Sputum 6/28 >>   Antimicrobials:  Azithro 6/28 x1 Ceftriaxone 6/28 x 1 Cefepime 6/28 >>   Interim history/subjective:  Comfortable on high level pressure support, no distress, wants to eat.  Objective   Blood pressure 136/84, pulse (Abnormal) 103, temperature 99.3 F (37.4 C), temperature source Oral, resp. rate (Abnormal) 22, height _0  (1.651 m), weight (Abnormal) 183.1 kg, SpO2 91 %, unknown if currently breastfeeding.    Vent Mode: PSV;CPAP FiO2 (%):  [40 %-50 %] 50 % Set Rate:  [26 bmp] 26 bmp Vt Set:  [450 mL] 450 mL PEEP:  [8 cmH20] 8 cmH20 Pressure Support:  [15 cmH20] 15 cmH20 Plateau Pressure:  [26  cmH20] 26 cmH20   Intake/Output Summary (Last 24 hours) at 02/21/2020 0932 Last data filed at 02/21/2020 1914 Gross per 24 hour  Intake 1200.28 ml  Output 5950 ml  Net -4749.72 ml   Filed Weights   02/19/20 0400 02/20/20 0400 02/21/20 0500  Weight: (Abnormal) 190 kg (Abnormal) 182 kg (Abnormal) 183.1 kg    Examination: General: Is a 39 year old black female she is resting comfortably on pressure control ventilation and in no acute distress this morning HEENT normocephalic atraumatic she has a size 6 cuffed tracheostomy in place scant bloody secretions following upsizing otherwise unremarkable Pulmonary: Equal chest rise bilaterally diminished bases no accessory use currently on pressure support ventilation, PSV of 15, PEEP 8, tidal volumes in the mid 300-400 range Cardiac: Regular rate and rhythm without audible murmur rub or gallop Abdomen soft nontender no organomegaly Extremities are warm and dry she continues to have lower extremity edema left lower extremity is wrapped in an Ace bandage Neuro awake oriented without focal deficits GU clear yellow  Resolved Hospital Problem list   Acute metabolic encephalopathy  Assessment & Plan:   Acute on chronic hypoxic/hypercapnic respiratory failure 2nd to community acquired pneumonia and acute pulmonary edema due to acute diastolic heart failure in setting of OSA/OHS with chronic tracheostomy. -Knowing [name] [name] for some time I am less convinced that this is pneumonia, suspect more likely decompensated diastolic heart failure and volume overload.  To date she is not grown anything from sputum culture, historically she has had difficulty with blood pressure control in the outpatient setting Portable chest x-ray personally reviewed:Her tracheostomy is  in satisfactory position there is cardiomegaly, bilateral volume loss.  Continued interstitial changes.  I think this reflects edema, and atelectasis no significant change Plan Continue pressure  support ventilation as tolerated Wean FiO2 for saturations greater than 90% We will continue PEEP at 8 given body habitus We will repeat IV Lasix today and push this as long as blood pressure, BUN and creatinine will tolerate Resume Coreg, Norvasc, and hydralazine, she can also start back her isosorbide Will start diet Complete 5 days of cefepime then discontinue Get her out of bed RASS goal 0 We will need to decide on whether or not she needs nocturnal ventilation, I would like to avoid this if able, she has had trouble with CPAP, I think she would find nocturnal ventilation equally as challenging   Sepsis 2nd to possible CAP and cellulitis with lymphedema. -All culture data negative Plan Complete 5 days cefepime Continue wound care lower extremities  CKD stage 2; fluid and electrolyte imbalance:hypokalemia  Plan Trend chem  Hold Ace-I with escalated diuretic regimen   Thrombocytopenia in setting of sepsis. -Improved Plan Intermittent CBC  Depression. Plan SSRI  Nutrition. Plan Start diet   Best practice:  Diet: NPO-->start diet  DVT prophylaxis: SQ heparin GI prophylaxis: protonix Mobility: bed rest Code Status: Full Disposition: ICU  Labs    CMP Latest Ref Rng & Units 02/21/2020 02/20/2020 02/20/2020  Glucose 70 - 99 mg/dL 85 77 -  BUN 6 - 20 mg/dL 20 27(H) -  Creatinine 0.44 - 1.00 mg/dL 1.20(H) 1.23(H) -  Sodium 135 - 145 mmol/L 144 147(H) 144  Potassium 3.5 - 5.1 mmol/L 3.4(L) 3.8 3.7  Chloride 98 - 111 mmol/L 97(L) 104 -  CO2 22 - 32 mmol/L 33(H) 29 -  Calcium 8.9 - 10.3 mg/dL 8.9 9.0 -  Total Protein 6.0 - 8.3 g/dL - - -  Total Bilirubin 0.2 - 1.2 mg/dL - - -  Alkaline Phos 39 - 117 U/L - - -  AST 0 - 37 U/L - - -  ALT 0 - 35 U/L - - -    CBC Latest Ref Rng & Units 02/21/2020 02/20/2020 02/20/2020  WBC 4.0 - 10.5 K/uL 5.2 5.2 -  Hemoglobin 12.0 - 15.0 g/dL 14.6 13.2 13.9  Hematocrit 36 - 46 % 46.9(H) 44.0 41.0  Platelets 150 - 400 K/uL 136(L) 113(L)  -    ABG    Component Value Date/Time   PHART 7.472 (H) 02/20/2020 0501   PCO2ART 50.8 (H) 02/20/2020 0501   PO2ART 76 (L) 02/20/2020 0501   HCO3 36.8 (H) 02/20/2020 0501   TCO2 38 (H) 02/20/2020 0501   O2SAT 95.0 02/20/2020 0501    CBG (last 3)  Recent Labs    02/20/20 2318 02/21/20 0315 02/21/20 0740  GLUCAP 81 85 86     My cct 36 minutes.  Signature:  Erick Colace ACNP-BC [place] Pager # [phone] OR # [phone] if no answer

## 2020-02-21 NOTE — Evaluation (Signed)
Occupational Therapy Evaluation Patient Details Name: [name] [name] MRN: [id number] DOB: 1981-08-06 Today's Date: 02/21/2020    History of Present Illness Pt is a 39 year old woman admitted on 02/18/20 with CAP and cellulitis. Developed AMS and acute repiratory failure requiring ventilation. PMH: morbid obesity, chronic trach, CKD II, CHF, DM, HTN, pulmonary HTN, lymphedema.   Clinical Impression   Pt was independent prior to admission. She lives with her mom, stepdad and 29 year old daughter. Pt presents with decreased activity tolerance. Evaluated while on pressure support/CPAP via trach. Pt with HR to 145 with minimal exertion. Pt requires set up to max assist for ADL. She transferred from bed to bariatric recliner with min guard assist. Likely to progress well and be able to return home. Will follow acutely.    Follow Up Recommendations  Home health OT    Equipment Recommendations  Tub/shower seat (bariatric)    Recommendations for Other Services       Precautions / Restrictions Precautions Precaution Comments: watch 02      Mobility Bed Mobility Overal bed mobility: Needs Assistance Bed Mobility: Supine to Sit     Supine to sit: Min assist     General bed mobility comments: pulled up on therapist's hand  Transfers Overall transfer level: Needs assistance   Transfers: Sit to/from Stand;Stand Pivot Transfers Sit to Stand: Min guard;+2 safety/equipment Stand pivot transfers: Min guard;+2 safety/equipment       General transfer comment: min guard for safety and lines    Balance Overall balance assessment: Needs assistance   Sitting balance-Leahy Scale: Good       Standing balance-Leahy Scale: Fair                             ADL either performed or assessed with clinical judgement   ADL Overall ADL's : Needs assistance/impaired Eating/Feeding: Independent   Grooming: Set up;Sitting   Upper Body Bathing: Minimal assistance;Sitting   Lower  Body Bathing: Sit to/from stand;Maximal assistance   Upper Body Dressing : Minimal assistance;Sitting   Lower Body Dressing: Maximal assistance;Sit to/from stand   Toilet Transfer: Min guard;Stand-pivot;BSC   Toileting- Water quality scientist and Hygiene: Maximal assistance;Sit to/from stand               Vision Baseline Vision/History: Wears glasses Wears Glasses: At all times Patient Visual Report: No change from baseline       Perception     Praxis      Pertinent Vitals/Pain Pain Assessment: No/denies pain     Hand Dominance Right   Extremity/Trunk Assessment Upper Extremity Assessment Upper Extremity Assessment: Overall WFL for tasks assessed   Lower Extremity Assessment Lower Extremity Assessment: Defer to PT evaluation   Cervical / Trunk Assessment Cervical / Trunk Assessment: Normal   Communication Communication Communication: Tracheostomy   Cognition Arousal/Alertness: Awake/alert Behavior During Therapy: WFL for tasks assessed/performed Overall Cognitive Status: Within Functional Limits for tasks assessed                                     General Comments       Exercises     Shoulder Instructions      Home Living Family/patient expects to be discharged to:: Private residence Living Arrangements: Parent;Children (parents and 20 year old daughter) Available Help at Discharge: Family;Available 24 hours/day Type of Home: House Home Access: Stairs to  enter Entrance Stairs-Number of Steps: 4 Entrance Stairs-Rails: Can reach both Home Layout: One level     Bathroom Shower/Tub: Occupational psychologist: Handicapped height     Home Equipment: None          Prior Functioning/Environment Level of Independence: Independent        Comments: walks distances without difficulty        OT Problem List: Decreased strength;Decreased activity tolerance;Impaired balance (sitting and/or standing);Obesity;Decreased  knowledge of use of DME or AE      OT Treatment/Interventions: Self-care/ADL training;Energy conservation;Patient/family education;Therapeutic activities    OT Goals(Current goals can be found in the care plan section) Acute Rehab OT Goals Patient Stated Goal: return home to her daughter OT Goal Formulation: With patient Time For Goal Achievement: 03/06/20 Potential to Achieve Goals: Good ADL Goals Pt Will Perform Grooming: with modified independence;standing Pt Will Perform Lower Body Bathing: with modified independence;with adaptive equipment;sit to/from stand Pt Will Perform Lower Body Dressing: with modified independence;sit to/from stand Pt Will Transfer to Toilet: with modified independence;ambulating;regular height toilet Pt Will Perform Toileting - Clothing Manipulation and hygiene: with modified independence;sit to/from stand Additional ADL Goal #1: Pt will utilize energy conservation strategies during ADL and mobility.  OT Frequency: Min 2X/week   Barriers to D/C:            Co-evaluation PT/OT/SLP Co-Evaluation/Treatment: Yes Reason for Co-Treatment: For patient/therapist safety   OT goals addressed during session: ADL's and self-care      AM-PAC OT "6 Clicks" Daily Activity     Outcome Measure Help from another person eating meals?: None Help from another person taking care of personal grooming?: A Little Help from another person toileting, which includes using toliet, bedpan, or urinal?: A Lot Help from another person bathing (including washing, rinsing, drying)?: A Lot Help from another person to put on and taking off regular upper body clothing?: A Little Help from another person to put on and taking off regular lower body clothing?: A Lot 6 Click Score: 16   End of Session Equipment Utilized During Treatment: Other (comment) (ventilator on CPAP settings)  Activity Tolerance: Patient tolerated treatment well Patient left: in chair;with call bell/phone within  reach  OT Visit Diagnosis: Unsteadiness on feet (R26.81);Muscle weakness (generalized) (M62.81) (decreased activity tolerance)                Time: 8592-9244 OT Time Calculation (min): 22 min Charges:  OT General Charges $OT Visit: 1 Visit OT Evaluation $OT Eval Moderate Complexity: 1 Mod  Nestor Lewandowsky, OTR/L Acute Rehabilitation Services Pager: [phone] Office: [phone]  Malka So 02/21/2020, 1:11 PM

## 2020-02-21 NOTE — Progress Notes (Signed)
Orthopedic Tech Progress Note Patient Details:  LAVENDER STANKE 1980/09/25 [id number]  Ortho Devices Type of Ortho Device: Haematologist Ortho Device/Splint Location: Left Lower Extremity Ortho Device/Splint Interventions: Ordered, Application   Post Interventions Patient Tolerated: Well Instructions Provided: Care of device, Poper ambulation with device   Rachael Zapanta P Lorel Monaco 02/21/2020, 6:52 PM

## 2020-02-22 LAB — GLUCOSE, CAPILLARY
Glucose-Capillary: 99 mg/dL (ref 70–99)
Glucose-Capillary: 91 mg/dL (ref 70–99)
Glucose-Capillary: 129 mg/dL — ABNORMAL HIGH (ref 70–99)
Glucose-Capillary: 136 mg/dL — ABNORMAL HIGH (ref 70–99)
Glucose-Capillary: 138 mg/dL — ABNORMAL HIGH (ref 70–99)
Glucose-Capillary: 101 mg/dL — ABNORMAL HIGH (ref 70–99)

## 2020-02-22 LAB — TRIGLYCERIDES: Triglycerides: 104 mg/dL (ref <150)

## 2020-02-22 LAB — BASIC METABOLIC PANEL
Anion gap: 10 (ref 5–15)
BUN: 25 mg/dL — ABNORMAL HIGH (ref 6–20)
CO2: 36 mmol/L — ABNORMAL HIGH (ref 22–32)
Calcium: 8.5 mg/dL — ABNORMAL LOW (ref 8.9–10.3)
Chloride: 92 mmol/L — ABNORMAL LOW (ref 98–111)
Creatinine, Ser: 1.26 mg/dL — ABNORMAL HIGH (ref 0.44–1.00)
GFR calc Af Amer: >60 mL/min (ref >60)
GFR calc non Af Amer: 53 mL/min — ABNORMAL LOW (ref >60)
Glucose, Bld: 101 mg/dL — ABNORMAL HIGH (ref 70–99)
Potassium: 3.7 mmol/L (ref 3.5–5.1)
Sodium: 138 mmol/L (ref 135–145)

## 2020-02-22 MED ORDER — CITALOPRAM HYDROBROMIDE 10 MG PO TABS
10.0000 mg | ORAL_TABLET | Freq: Every day | ORAL | Status: DC
  Administered 2020-02-22 – 2020-02-27 (×6): 10 mg via ORAL
  Filled 2020-02-22 (×6): qty 1

## 2020-02-22 MED ORDER — FUROSEMIDE 10 MG/ML IJ SOLN
40.0000 mg | Freq: Four times a day (QID) | INTRAMUSCULAR | Status: DC
  Administered 2020-02-22: 40 mg via INTRAVENOUS
  Filled 2020-02-22: qty 4

## 2020-02-22 MED ORDER — CHLORHEXIDINE GLUCONATE CLOTH 2 % EX PADS
6.0000 | MEDICATED_PAD | Freq: Every day | CUTANEOUS | Status: DC
  Administered 2020-02-23 – 2020-02-27 (×5): 6 via TOPICAL

## 2020-02-22 MED ORDER — FUROSEMIDE 10 MG/ML IJ SOLN: 40.0000 mg | Freq: Once | INTRAMUSCULAR | Status: AC

## 2020-02-22 NOTE — Progress Notes (Signed)
Physical Therapy Treatment Patient Details Name: [name] [name] MRN: [id number] DOB: Jul 09, 1981 Today's Date: 02/22/2020    History of Present Illness Pt is a 39 year old woman admitted on 02/18/20 with CAP and cellulitis. Developed AMS and acute respiratory failure requiring ventilation. PMH: morbid obesity, chronic trach, CKD II, CHF, DM, HTN, pulmonary HTN, lymphedema.    PT Comments    Pt looking well, progressing toward goals well.  Pt on 60% TC with sats at 91% at rest.  On 55% TC on venturi set up, pt's sats at 94% during gait.  Emphasis on progressing gait stability and stamina.    Follow Up Recommendations  Home health PT     Equipment Recommendations  Other (comment);None recommended by PT (TBD)    Recommendations for Other Services       Precautions / Restrictions Precautions Precaution Comments: watch SpO2    Mobility  Bed Mobility               General bed mobility comments: in bari chair on arrival  Transfers Overall transfer level: Needs assistance   Transfers: Sit to/from Stand Sit to Stand: Supervision            Ambulation/Gait Ambulation/Gait assistance: Min guard Gait Distance (Feet): 300 Feet Assistive device: None Gait Pattern/deviations: Step-through pattern Gait velocity: slower Gait velocity interpretation: <1.8 ft/sec, indicate of risk for recurrent falls General Gait Details: generally steady overall, stopping or slowing on occasion for rest, dyspnea 2/4, HR 100's, Sats with gait on 55% FiO2 running 94% overall   Stairs             Wheelchair Mobility    Modified Rankin (Stroke Patients Only)       Balance Overall balance assessment: Needs assistance   Sitting balance-Leahy Scale: Good       Standing balance-Leahy Scale: Fair                              Cognition Arousal/Alertness: Awake/alert Behavior During Therapy: WFL for tasks assessed/performed Overall Cognitive Status: Within  Functional Limits for tasks assessed                                        Exercises      General Comments        Pertinent Vitals/Pain Pain Assessment: Faces Faces Pain Scale: No hurt Pain Intervention(s): Monitored during session    Home Living                      Prior Function            PT Goals (current goals can now be found in the care plan section) Acute Rehab PT Goals Patient Stated Goal: return home to her daughter PT Goal Formulation: With patient Time For Goal Achievement: 03/06/20 Potential to Achieve Goals: Good Progress towards PT goals: Progressing toward goals    Frequency    Min 3X/week      PT Plan Current plan remains appropriate    Co-evaluation              AM-PAC PT "6 Clicks" Mobility   Outcome Measure  Help needed turning from your back to your side while in a flat bed without using bedrails?: A Little Help needed moving from lying on your back to sitting on the  side of a flat bed without using bedrails?: A Little Help needed moving to and from a bed to a chair (including a wheelchair)?: A Little Help needed standing up from a chair using your arms (e.g., wheelchair or bedside chair)?: None Help needed to walk in hospital room?: A Little Help needed climbing 3-5 steps with a railing? : A Little 6 Click Score: 19    End of Session Equipment Utilized During Treatment: Oxygen Activity Tolerance: Patient tolerated treatment well Patient left: in chair;with call bell/phone within reach Nurse Communication: Mobility status PT Visit Diagnosis: Other abnormalities of gait and mobility (R26.89);Difficulty in walking, not elsewhere classified (R26.2)     Time: 5797-2820 PT Time Calculation (min) (ACUTE ONLY): 23 min  Charges:  $Gait Training: 8-22 mins $Therapeutic Activity: 8-22 mins                     02/22/2020  Ginger Carne., PT Acute Rehabilitation Services [phone]  (pager) [phone]   (office)   [name] [name] 02/22/2020, 2:12 PM

## 2020-02-22 NOTE — Progress Notes (Signed)
NAME:  [name] [name], MRN:  [id number], DOB:  1981-01-12, LOS: 4 ADMISSION DATE:  02/17/2020, CONSULTATION DATE:  02/18/20  REFERRING MD:  Dr Lorin Mercy, CHIEF COMPLAINT:  Dyspnea, cough, leg swelling   Brief History   39 yo female with OSA/OHS and chronic tracheostomy since 2017 presented to ER with dyspnea, cough and edema.  Found to have community acquired pneumonia and cellulitis.  Developed encephalopathy from worsening hypoxia/hypercapnia.  Transferred to ICU and placed on ventilator.  Past Medical History   OSA/OHS  Chronic hypoxic/hypercapnic respiratory failure  Tracheostomy status  Chronic diastolic CHF  CKD 4  DM type 2  HTN  Pulmonary hypertension  Lymphedema  Significant Hospital Events   6/28 Admit 6/29 had #6 ETT placed through trach stoma to assist with mechanical ventilation; ENT consulted to upsize trach 7/1: Started diet.  Physically feeling better.  Starting to mobilize. 7/2: patient in bedside chair  Consults:  ENT  Procedures:  ETT through trach stoma 6/29 >> 6/29 #6 cuffed trach 6/29 >>   Significant Diagnostic Tests:   CT chest 6/28 >> bilateral basilar scattered infiltrates/atelectasis, cardiomegaly, evidence for secondary PAH  Doppler legs b/l 6/28 >> limited study, no obvious DVT  Micro Data:  SARS CoV2 6/28 >> negative MRSA PCR 6/28 >> negative Blood 6/28 >>  Sputum 6/28 >>   Antimicrobials:  Azithro 6/28 x1 Ceftriaxone 6/28 x 1 Cefepime 6/28 >>   Interim history/subjective:  Comfortable on high level pressure support, no distress, wants to eat.  Objective   Blood pressure 114/80, pulse 78, temperature 98.5 F (36.9 C), temperature source Oral, resp. rate (!) 26, height _0  (1.651 m), weight (!) 183.1 kg, SpO2 91 %, unknown if currently breastfeeding.    Vent Mode: PRVC FiO2 (%):  [40 %-60 %] 60 % Set Rate:  [26 bmp] 26 bmp Vt Set:  [450 mL] 450 mL PEEP:  [8 cmH20] 8 cmH20 Plateau Pressure:  [25 cmH20] 25 cmH20   Intake/Output  Summary (Last 24 hours) at 02/22/2020 5883 Last data filed at 02/22/2020 2549 Gross per 24 hour  Intake 2996.53 ml  Output 1305 ml  Net 1691.53 ml   Filed Weights   02/19/20 0400 02/20/20 0400 02/21/20 0500  Weight: (!) 190 kg (!) 182 kg (!) 183.1 kg   Examination: General:obese female, sitting upright in bed side chair, pleasant demeanor, in NAD   Pulmonary: occasional wheezing on right lung field, intermittent rhonchi, fine crackles at left lung base  Cardiac: Regular rate and rhythm  Abdomen obese abdomen, bowel sounds present, nontender to palpation  Extremities: LLE in unna boot, apparent venous skin changes   Neuro alert and oriented, follows commands    Resolved Hospital Problem list   Acute metabolic encephalopathy  Assessment & Plan:   Acute on chronic hypoxic/hypercapnic respiratory failure 2/2 CAP Acute pulmonary edema due to acute diastolic heart failure in setting of OSA/OHS with chronic tracheostomy. Continue pressure support ventilation as tolerated, currently on tracheostomy collar 91% o2 saturation at 60% FiO2  Wean FiO2 for saturations greater than 90%, currently at 60%  PEEP at 8 repeat 71m IV Lasix today fir diuresis  Continue Coreg, Norvasc, and hydralazine, isosorbide Today is day 5/5 of cefepime continue to have patient work with physical therapy  RASS goal 0  Sepsis 2nd to possible CAP and cellulitis with lymphedema. All culture data negative Will complete 5 days cefepime today  Continue wound care lower extremities  CKD stage 2; fluid and electrolyte imbalance : hypokalemia, resolved  Monitor BMP, Cr 1.26 from 1.20 yesterday  Hypokalemia resolved   Thrombocytopenia in setting of sepsis, Improved Intermittent CBC  Depression. Patient may benefit from starting SSRI  Nutrition. Heart healthy diet   Best practice:  Diet: heart healthy diet  DVT prophylaxis: SQ heparin GI prophylaxis: protonix Mobility: encourage patient OOB with PT  Code  Status: Full Disposition: ICU  Labs    CMP Latest Ref Rng & Units 02/22/2020 02/21/2020 02/20/2020  Glucose 70 - 99 mg/dL 101(H) 85 77  BUN 6 - 20 mg/dL 25(H) 20 27(H)  Creatinine 0.44 - 1.00 mg/dL 1.26(H) 1.20(H) 1.23(H)  Sodium 135 - 145 mmol/L 138 144 147(H)  Potassium 3.5 - 5.1 mmol/L 3.7 3.4(L) 3.8  Chloride 98 - 111 mmol/L 92(L) 97(L) 104  CO2 22 - 32 mmol/L 36(H) 33(H) 29  Calcium 8.9 - 10.3 mg/dL 8.5(L) 8.9 9.0  Total Protein 6.0 - 8.3 g/dL - - -  Total Bilirubin 0.2 - 1.2 mg/dL - - -  Alkaline Phos 39 - 117 U/L - - -  AST 0 - 37 U/L - - -  ALT 0 - 35 U/L - - -    CBC Latest Ref Rng & Units 02/21/2020 02/20/2020 02/20/2020  WBC 4.0 - 10.5 K/uL 5.2 5.2 -  Hemoglobin 12.0 - 15.0 g/dL 14.6 13.2 13.9  Hematocrit 36 - 46 % 46.9(H) 44.0 41.0  Platelets 150 - 400 K/uL 136(L) 113(L) -    ABG    Component Value Date/Time   PHART 7.472 (H) 02/20/2020 0501   PCO2ART 50.8 (H) 02/20/2020 0501   PO2ART 76 (L) 02/20/2020 0501   HCO3 36.8 (H) 02/20/2020 0501   TCO2 38 (H) 02/20/2020 0501   O2SAT 95.0 02/20/2020 0501    CBG (last 3)  Recent Labs    02/21/20 2315 02/22/20 0336 02/22/20 0742  GLUCAP 121* 99 91     Signature:  Eulis Foster, MD  Family Medicine Residency PGY-2

## 2020-02-22 NOTE — Progress Notes (Signed)
Patient requested to come off the vent to eat. Pt placed on ATC 60% 10L and trach care performed without any complications. RT will continue to monitor.

## 2020-02-23 ENCOUNTER — Inpatient Hospital Stay (HOSPITAL_COMMUNITY): Payer: Medicare Other

## 2020-02-23 DIAGNOSIS — J9621 Acute and chronic respiratory failure with hypoxia: Secondary | ICD-10-CM

## 2020-02-23 DIAGNOSIS — J9622 Acute and chronic respiratory failure with hypercapnia: Secondary | ICD-10-CM

## 2020-02-23 LAB — CULTURE, BLOOD (ROUTINE X 2)
Culture: NO GROWTH
Culture: NO GROWTH

## 2020-02-23 LAB — GLUCOSE, CAPILLARY
Glucose-Capillary: 119 mg/dL — ABNORMAL HIGH (ref 70–99)
Glucose-Capillary: 121 mg/dL — ABNORMAL HIGH (ref 70–99)
Glucose-Capillary: 122 mg/dL — ABNORMAL HIGH (ref 70–99)
Glucose-Capillary: 171 mg/dL — ABNORMAL HIGH (ref 70–99)
Glucose-Capillary: 79 mg/dL (ref 70–99)

## 2020-02-23 LAB — BASIC METABOLIC PANEL
Anion gap: 12 (ref 5–15)
BUN: 30 mg/dL — ABNORMAL HIGH (ref 6–20)
CO2: 32 mmol/L (ref 22–32)
Calcium: 8.8 mg/dL — ABNORMAL LOW (ref 8.9–10.3)
Chloride: 96 mmol/L — ABNORMAL LOW (ref 98–111)
Creatinine, Ser: 1.34 mg/dL — ABNORMAL HIGH (ref 0.44–1.00)
GFR calc Af Amer: 57 mL/min — ABNORMAL LOW (ref >60)
GFR calc non Af Amer: 49 mL/min — ABNORMAL LOW (ref >60)
Glucose, Bld: 87 mg/dL (ref 70–99)
Potassium: 4.2 mmol/L (ref 3.5–5.1)
Sodium: 140 mmol/L (ref 135–145)

## 2020-02-23 LAB — MAGNESIUM: Magnesium: 2 mg/dL (ref 1.7–2.4)

## 2020-02-23 MED ORDER — INSULIN ASPART 100 UNIT/ML ~~LOC~~ SOLN: 2.0000 [IU] | SUBCUTANEOUS | Status: DC

## 2020-02-23 NOTE — Progress Notes (Signed)
[facility] Progress Note Patient Name: [name] [name] DOB: 01/30/1981 MRN: [id number]   Date of Service  02/23/2020  HPI/Events of Note  Hyperglycemia, needs CBG monitoring,  Needs order to continue Foley catheter.  eICU Interventions  D 5 NS infusion discontinued, CBG monitoring ordered,  Order to continue Foley catheter entered.        Kerry Kass Marcelyn Ruppe 02/23/2020, 10:06 PM

## 2020-02-23 NOTE — Progress Notes (Signed)
NAME:  [name] [name], MRN:  [id number], DOB:  26-Aug-1980, LOS: 5 ADMISSION DATE:  02/17/2020, CONSULTATION DATE:  02/18/20  REFERRING MD:  Dr Lorin Mercy, CHIEF COMPLAINT:  Dyspnea, cough, leg swelling   Brief History   39 yo female with OSA/OHS and chronic tracheostomy since 2017 presented to ER with dyspnea, cough and edema.  Found to have community acquired pneumonia and cellulitis.  Developed encephalopathy from worsening hypoxia/hypercapnia.  Transferred to ICU and placed on ventilator. Patient is now using ventilator at night and tracheostomy collar during the day.   Past Medical History   OSA/OHS  Chronic hypoxic/hypercapnic respiratory failure  Tracheostomy status  Chronic diastolic CHF  CKD 4  DM type 2  HTN  Pulmonary hypertension  Lymphedema  Significant Hospital Events   6/28 Admit 6/29 had #6 ETT placed through trach stoma to assist with mechanical ventilation; ENT consulted to upsize trach 7/1: Started diet.  Physically feeling better.  Starting to mobilize. 7/2: patient in bedside chair, attempting to wean FiO2  Consults:  ENT  Procedures:  ETT through trach stoma 6/29 >> 6/29 #6 cuffed trach 6/29 >>   Significant Diagnostic Tests:   CT chest 6/28 >> bilateral basilar scattered infiltrates/atelectasis, cardiomegaly, evidence for secondary PAH  Doppler legs b/l 6/28 >> limited study, no obvious DVT  Micro Data:  SARS CoV2 6/28 >> negative MRSA PCR 6/28 >> negative Blood 6/28 >>  Sputum 6/28 >>   Antimicrobials:  Azithro 6/28 x1 Ceftriaxone 6/28 x 1 Cefepime 6/28 >>   Interim history/subjective:  Comfortable on high level pressure support, no distress, wants to eat.  Objective   Blood pressure 132/77, pulse (!) 110, temperature 98.2 F (36.8 C), temperature source Oral, resp. rate (!) 26, height _0  (1.651 m), weight (!) 174 kg, SpO2 93 %, unknown if currently breastfeeding.    Vent Mode: PRVC FiO2 (%):  [40 %-80 %] 50 % Set Rate:  [26 bmp] 26  bmp Vt Set:  [450 mL] 450 mL Plateau Pressure:  [29 cmH20] 29 cmH20   Intake/Output Summary (Last 24 hours) at 02/23/2020 0355 Last data filed at 02/23/2020 0800 Gross per 24 hour  Intake 711.39 ml  Output 870 ml  Net -158.61 ml   Filed Weights   02/21/20 0500 02/22/20 0500 02/23/20 0400  Weight: (!) 183.1 kg (!) 183.1 kg (!) 174 kg   Examination: General: obese female sitting upright in bed side chair in NAD, just finishing breakfast  Pulmonary: rhonchi, left lung base fine crackles, productive cough intermittently during exam, patient requesting suctioning  Cardiac: regular rate and rhythm  Abdomen : soft, nontender  Extremities: LLE in unna boot  Neuro: alert and oriented x4    Resolved Hospital Problem list   Acute metabolic encephalopathy  Assessment & Plan:   Acute on chronic hypoxic/hypercapnic respiratory failure 2/2 CAP Acute pulmonary edema due to acute diastolic heart failure in setting of OSA/OHS with chronic tracheostomy. Continue pressure support ventilation as tolerated, currently on tracheostomy collar 91% o2 saturation at 60% FiO2  Wean FiO2 for saturations greater than 90%, currently at 60%  Continue Coreg, Norvasc, and hydralazine, isosorbide Completed 5 days of cefepime Continue physical therapy  RASS goal 0 CXR today  Sepsis 2nd to possible CAP and cellulitis with lymphedema. All culture data negative Completed 5 days of cefepime for CAP treatment  Continue wound care lower extremities  Acute on chronic KD, stage 2 Monitor BMP, Cr increased from 1.2 to 1.3 today Will hold off on further  diuresis to allow for renal recovery   Thrombocytopenia in setting of sepsis, Improved CBC on 7/4   Depression. Continue Celexa 43m daily   Nutrition. Heart healthy diet   Best practice:  Diet: heart healthy diet  DVT prophylaxis: SQ heparin GI prophylaxis: protonix Mobility: encourage patient OOB with PT  Code Status: Full Disposition: ICU  Labs     CMP Latest Ref Rng & Units 02/23/2020 02/22/2020 02/21/2020  Glucose 70 - 99 mg/dL 87 101(H) 85  BUN 6 - 20 mg/dL 30(H) 25(H) 20  Creatinine 0.44 - 1.00 mg/dL 1.34(H) 1.26(H) 1.20(H)  Sodium 135 - 145 mmol/L 140 138 144  Potassium 3.5 - 5.1 mmol/L 4.2 3.7 3.4(L)  Chloride 98 - 111 mmol/L 96(L) 92(L) 97(L)  CO2 22 - 32 mmol/L 32 36(H) 33(H)  Calcium 8.9 - 10.3 mg/dL 8.8(L) 8.5(L) 8.9  Total Protein 6.0 - 8.3 g/dL - - -  Total Bilirubin 0.2 - 1.2 mg/dL - - -  Alkaline Phos 39 - 117 U/L - - -  AST 0 - 37 U/L - - -  ALT 0 - 35 U/L - - -    CBC Latest Ref Rng & Units 02/21/2020 02/20/2020 02/20/2020  WBC 4.0 - 10.5 K/uL 5.2 5.2 -  Hemoglobin 12.0 - 15.0 g/dL 14.6 13.2 13.9  Hematocrit 36 - 46 % 46.9(H) 44.0 41.0  Platelets 150 - 400 K/uL 136(L) 113(L) -    ABG    Component Value Date/Time   PHART 7.472 (H) 02/20/2020 0501   PCO2ART 50.8 (H) 02/20/2020 0501   PO2ART 76 (L) 02/20/2020 0501   HCO3 36.8 (H) 02/20/2020 0501   TCO2 38 (H) 02/20/2020 0501   O2SAT 95.0 02/20/2020 0501    CBG (last 3)  Recent Labs    02/22/20 1925 02/22/20 2319 02/23/20 0804  GLUCAP 138* 101* 79     Signature:  MEulis Foster MD  Family Medicine Residency PGY-2

## 2020-02-23 NOTE — Progress Notes (Signed)
Trach care performed on pt. Pt tolerated well, RT will continue to monitor.

## 2020-02-24 LAB — BASIC METABOLIC PANEL
Anion gap: 5 (ref 5–15)
BUN: 28 mg/dL — ABNORMAL HIGH (ref 6–20)
CO2: 35 mmol/L — ABNORMAL HIGH (ref 22–32)
Calcium: 8.8 mg/dL — ABNORMAL LOW (ref 8.9–10.3)
Chloride: 98 mmol/L (ref 98–111)
Creatinine, Ser: 1.35 mg/dL — ABNORMAL HIGH (ref 0.44–1.00)
GFR calc Af Amer: 57 mL/min — ABNORMAL LOW (ref >60)
GFR calc non Af Amer: 49 mL/min — ABNORMAL LOW (ref >60)
Glucose, Bld: 96 mg/dL (ref 70–99)
Potassium: 4.6 mmol/L (ref 3.5–5.1)
Sodium: 138 mmol/L (ref 135–145)

## 2020-02-24 LAB — CBC WITH DIFFERENTIAL/PLATELET
Abs Immature Granulocytes: 0.01 10*3/uL (ref 0.00–0.07)
Basophils Absolute: 0 10*3/uL (ref 0.0–0.1)
Basophils Relative: 1 %
Eosinophils Absolute: 0.5 10*3/uL (ref 0.0–0.5)
Eosinophils Relative: 13 %
HCT: 47.6 % — ABNORMAL HIGH (ref 36.0–46.0)
Hemoglobin: 13.7 g/dL (ref 12.0–15.0)
Immature Granulocytes: 0 %
Lymphocytes Relative: 15 %
Lymphs Abs: 0.6 10*3/uL — ABNORMAL LOW (ref 0.7–4.0)
MCH: 26.6 pg (ref 26.0–34.0)
MCHC: 28.8 g/dL — ABNORMAL LOW (ref 30.0–36.0)
MCV: 92.4 fL (ref 80.0–100.0)
Monocytes Absolute: 0.8 10*3/uL (ref 0.1–1.0)
Monocytes Relative: 20 %
Neutro Abs: 2 10*3/uL (ref 1.7–7.7)
Neutrophils Relative %: 51 %
Platelets: 132 10*3/uL — ABNORMAL LOW (ref 150–400)
RBC: 5.15 MIL/uL — ABNORMAL HIGH (ref 3.87–5.11)
RDW: 17.7 % — ABNORMAL HIGH (ref 11.5–15.5)
WBC: 3.9 10*3/uL — ABNORMAL LOW (ref 4.0–10.5)
nRBC: 0 % (ref 0.0–0.2)

## 2020-02-24 LAB — GLUCOSE, CAPILLARY
Glucose-Capillary: 91 mg/dL (ref 70–99)
Glucose-Capillary: 92 mg/dL (ref 70–99)
Glucose-Capillary: 114 mg/dL — ABNORMAL HIGH (ref 70–99)
Glucose-Capillary: 122 mg/dL — ABNORMAL HIGH (ref 70–99)
Glucose-Capillary: 142 mg/dL — ABNORMAL HIGH (ref 70–99)

## 2020-02-24 NOTE — Progress Notes (Signed)
NAME:  [name] [name], MRN:  [id number], DOB:  07/17/1981, LOS: 6 ADMISSION DATE:  02/17/2020, CONSULTATION DATE:  02/18/20  REFERRING MD:  Dr Lorin Mercy, CHIEF COMPLAINT:  Dyspnea, cough, leg swelling   Brief History   39 yo female with OSA/OHS and chronic tracheostomy since 2017 presented to ER with dyspnea, cough and edema.  Found to have community acquired pneumonia and cellulitis.  Developed encephalopathy from worsening hypoxia/hypercapnia.  Transferred to ICU and placed on ventilator. Patient is now using ventilator at night and tracheostomy collar during the day.   Past Medical History   OSA/OHS  Chronic hypoxic/hypercapnic respiratory failure  Tracheostomy status  Chronic diastolic CHF  CKD 4  DM type 2  HTN  Pulmonary hypertension  Lymphedema  Significant Hospital Events   6/28 Admit 6/29 had #6 ETT placed through trach stoma to assist with mechanical ventilation; ENT consulted to upsize trach 7/1: Started diet.  Physically feeling better.  Starting to mobilize. 7/2: patient in bedside chair, attempting to wean FiO2  Consults:  ENT  Procedures:  ETT through trach stoma 6/29 >> 6/29 #6 cuffed trach 6/29 >>   Significant Diagnostic Tests:   CT chest 6/28 >> bilateral basilar scattered infiltrates/atelectasis, cardiomegaly, evidence for secondary PAH  Doppler legs b/l 6/28 >> limited study, no obvious DVT  Micro Data:  SARS CoV2 6/28 >> negative MRSA PCR 6/28 >> negative Blood 6/28 >> neg   Antimicrobials:  Azithro 6/28 x1 Ceftriaxone 6/28 x 1 Cefepime 6/28 >> 7/2  Interim history/subjective:  7/4: on ATC 24h at this time. Will monitor for another day in ICU and then transfer out. Hopefully she will be successful as her goal is to return home without vent  Objective   Blood pressure 126/80, pulse 76, temperature 98.2 F (36.8 C), temperature source Oral, resp. rate (!) 24, height _0  (1.651 m), weight (!) 174 kg, SpO2 94 %, unknown if currently  breastfeeding.    FiO2 (%):  [60 %-80 %] 60 %   Intake/Output Summary (Last 24 hours) at 02/24/2020 0953 Last data filed at 02/24/2020 0600 Gross per 24 hour  Intake 127.56 ml  Output 785 ml  Net -657.44 ml   Filed Weights   02/21/20 0500 02/22/20 0500 02/23/20 0400  Weight: (!) 183.1 kg (!) 183.1 kg (!) 174 kg   Examination: General: obese female sitting upright in bed side chair in NAD on ATC Pulmonary: distant and diminished bilaterally intermittently  Cardiac: regular rate and rhythm  Abdomen : obese, soft, nontender  Extremities: LLE in unna boot  Neuro: alert and oriented x4    Resolved Hospital Problem list   Acute metabolic encephalopathy  Assessment & Plan:   Acute on chronic hypoxic/hypercapnic respiratory failure 2/2 CAP Acute pulmonary edema due to acute diastolic heart failure in setting of OSA/OHS with chronic tracheostomy. Cont ATC as tolerated Wean FiO2 for saturations greater than 90% Continue Coreg, Norvasc, and hydralazine, isosorbide Completed 5 days of cefepime Continue physical therapy  RASS goal 0 CXR 7/3 with stable edema, personally reviewed by me -will consult speech for pmv  Sepsis 2nd to possible CAP and cellulitis with lymphedema. All culture data negative Completed 5 days of cefepime for CAP treatment  Continue wound care lower extremities  Acute on chronic KD, stage 2 Monitor BMP, Cr relatively stable Will hold off on further diuresis to allow for renal recovery  -baseline ~1 -remove foley today  Thrombocytopenia -stable in 130's  Depression. Continue Celexa 23m daily   Nutrition.  Heart healthy diet  -pt with poor appetite   Best practice:  Diet: heart healthy diet  DVT prophylaxis: SQ heparin GI prophylaxis: protonix Mobility: encourage patient OOB with PT  Code Status: Full Disposition: ICU, hopefully to floor in next 24 hours if remains liberated from vent.   Labs    CMP Latest Ref Rng & Units 02/24/2020 02/23/2020  02/22/2020  Glucose 70 - 99 mg/dL 96 87 101(H)  BUN 6 - 20 mg/dL 28(H) 30(H) 25(H)  Creatinine 0.44 - 1.00 mg/dL 1.35(H) 1.34(H) 1.26(H)  Sodium 135 - 145 mmol/L 138 140 138  Potassium 3.5 - 5.1 mmol/L 4.6 4.2 3.7  Chloride 98 - 111 mmol/L 98 96(L) 92(L)  CO2 22 - 32 mmol/L 35(H) 32 36(H)  Calcium 8.9 - 10.3 mg/dL 8.8(L) 8.8(L) 8.5(L)  Total Protein 6.0 - 8.3 g/dL - - -  Total Bilirubin 0.2 - 1.2 mg/dL - - -  Alkaline Phos 39 - 117 U/L - - -  AST 0 - 37 U/L - - -  ALT 0 - 35 U/L - - -    CBC Latest Ref Rng & Units 02/24/2020 02/21/2020 02/20/2020  WBC 4.0 - 10.5 K/uL 3.9(L) 5.2 5.2  Hemoglobin 12.0 - 15.0 g/dL 13.7 14.6 13.2  Hematocrit 36 - 46 % 47.6(H) 46.9(H) 44.0  Platelets 150 - 400 K/uL 132(L) 136(L) 113(L)    ABG    Component Value Date/Time   PHART 7.472 (H) 02/20/2020 0501   PCO2ART 50.8 (H) 02/20/2020 0501   PO2ART 76 (L) 02/20/2020 0501   HCO3 36.8 (H) 02/20/2020 0501   TCO2 38 (H) 02/20/2020 0501   O2SAT 95.0 02/20/2020 0501    CBG (last 3)  Recent Labs    02/23/20 2348 02/24/20 0340 02/24/20 0731  GLUCAP 121* 91 92       care time 35 mins. This represents my time independent of the NPs time taking care of the pt. This is excluding procedures.    [facility] 02/24/2020, 9:53 AM

## 2020-02-25 LAB — BASIC METABOLIC PANEL
Anion gap: 10 (ref 5–15)
BUN: 31 mg/dL — ABNORMAL HIGH (ref 6–20)
CO2: 28 mmol/L (ref 22–32)
Calcium: 8.8 mg/dL — ABNORMAL LOW (ref 8.9–10.3)
Chloride: 97 mmol/L — ABNORMAL LOW (ref 98–111)
Creatinine, Ser: 1.48 mg/dL — ABNORMAL HIGH (ref 0.44–1.00)
GFR calc Af Amer: 51 mL/min — ABNORMAL LOW (ref >60)
GFR calc non Af Amer: 44 mL/min — ABNORMAL LOW (ref >60)
Glucose, Bld: 98 mg/dL (ref 70–99)
Potassium: 5.3 mmol/L — ABNORMAL HIGH (ref 3.5–5.1)
Sodium: 135 mmol/L (ref 135–145)

## 2020-02-25 LAB — GLUCOSE, CAPILLARY
Glucose-Capillary: 109 mg/dL — ABNORMAL HIGH (ref 70–99)
Glucose-Capillary: 116 mg/dL — ABNORMAL HIGH (ref 70–99)
Glucose-Capillary: 121 mg/dL — ABNORMAL HIGH (ref 70–99)
Glucose-Capillary: 123 mg/dL — ABNORMAL HIGH (ref 70–99)
Glucose-Capillary: 130 mg/dL — ABNORMAL HIGH (ref 70–99)
Glucose-Capillary: 90 mg/dL (ref 70–99)
Glucose-Capillary: 94 mg/dL (ref 70–99)

## 2020-02-25 NOTE — Progress Notes (Deleted)
NAME:  [name] [name], MRN:  [id number], DOB:  [birth year], LOS: 7 ADMISSION DATE:  02/17/2020, CONSULTATION DATE:  02/18/20  REFERRING MD:  Dr Lorin Mercy, CHIEF COMPLAINT:  Dyspnea, cough, leg swelling   Brief History   39 yo female with OSA/OHS and chronic tracheostomy since 2017 presented to ER with dyspnea, cough and edema.  Found to have community acquired pneumonia and cellulitis.  Developed encephalopathy from worsening hypoxia/hypercapnia.  Transferred to ICU and placed on ventilator. Patient is now using ventilator at night and tracheostomy collar during the day.   Past Medical History   OSA/OHS  Chronic hypoxic/hypercapnic respiratory failure  Tracheostomy status  Chronic diastolic CHF  CKD 4  DM type 2  HTN  Pulmonary hypertension  Lymphedema  Significant Hospital Events   6/28 Admit 6/29 had #6 ETT placed through trach stoma to assist with mechanical ventilation; ENT consulted to upsize trach 7/1: Started diet.  Physically feeling better.  Starting to mobilize. 7/2: patient in bedside chair, attempting to wean FiO2  Consults:  ENT  Procedures:  ETT through trach stoma 6/29 >> 6/29 #6 cuffed trach 6/29 >>   Significant Diagnostic Tests:   CT chest 6/28 >> bilateral basilar scattered infiltrates/atelectasis, cardiomegaly, evidence for secondary PAH  Doppler legs b/l 6/28 >> limited study, no obvious DVT  Micro Data:  SARS CoV2 6/28 >> negative MRSA PCR 6/28 >> negative Blood 6/28 >> neg  Antimicrobials:  Azithro 6/28 x1 Ceftriaxone 6/28 x 1 Cefepime 6/28 >> 7/2  Interim history/subjective:  7/4: on ATC 24h at this time. Will monitor for another day in ICU and then transfer out. Hopefully she will be successful as her goal is to return home without vent  Objective   Blood pressure 110/72, pulse 86, temperature 98.1 F (36.7 C), temperature source Oral, resp. rate 15, height _0  (1.651 m), weight (!) 174.2 kg, SpO2 100 %, unknown if currently breastfeeding.     FiO2 (%):  [60 %] 60 %   Intake/Output Summary (Last 24 hours) at 02/25/2020 0910 Last data filed at 02/24/2020 2000 Gross per 24 hour  Intake 600 ml  Output 400 ml  Net 200 ml   Filed Weights   02/22/20 0500 02/23/20 0400 02/25/20 0345  Weight: (!) 183.1 kg (!) 174 kg (!) 174.2 kg   Examination: General: obese female sitting upright in bed side chair in NAD on ATC Pulmonary: distant breath sounds, crackles on left base improved from prior  Cardiac: regular rate and rhythm without murmur   Abdomen : obese, soft, nontender, bowel sounds present throughout  Extremities: LLE in unna boot, no warmth to touch bilaterally  Neuro: alert and oriented x4  Psych: appears to be in good spirits  Resolved Hospital Problem list   Acute metabolic encephalopathy  Assessment & Plan:   Acute on chronic hypoxic/hypercapnic respiratory failure 2/2 CAP Acute pulmonary edema due to acute diastolic heart failure in setting of OSA/OHS with chronic tracheostomy. Cont ATC as tolerated Wean FiO2 for saturations greater than 90% Continue Coreg, Norvasc, and hydralazine, isosorbide Completed 5 days of cefepime Continue physical therapy  At Goal for RASS  Plan to transfer to floor  Sepsis 2nd to possible CAP and cellulitis with lymphedema. All culture data negative Completed 5 days of cefepime for CAP treatment  Continue wound care lower extremities  Acute renal injury on chronic kidney disease, stage 2 Cr increased from 1.3 to 1.48 -continue to monitor BMP for Cr   Thrombocytopenia stable in 130's  Depression.  Continue Celexa 22m daily   Nutrition. Heart healthy diet  -pt with poor appetite   Best practice:  Diet: heart healthy diet  DVT prophylaxis: SQ heparin GI prophylaxis: protonix Mobility: encourage patient OOB with PT  Code Status: Full Disposition: transfer to floor today   Labs    CMP Latest Ref Rng & Units 02/25/2020 02/24/2020 02/23/2020  Glucose 70 - 99 mg/dL 98 96 87  BUN  6 - 20 mg/dL 31(H) 28(H) 30(H)  Creatinine 0.44 - 1.00 mg/dL 1.48(H) 1.35(H) 1.34(H)  Sodium 135 - 145 mmol/L 135 138 140  Potassium 3.5 - 5.1 mmol/L 5.3(H) 4.6 4.2  Chloride 98 - 111 mmol/L 97(L) 98 96(L)  CO2 22 - 32 mmol/L 28 35(H) 32  Calcium 8.9 - 10.3 mg/dL 8.8(L) 8.8(L) 8.8(L)  Total Protein 6.0 - 8.3 g/dL - - -  Total Bilirubin 0.2 - 1.2 mg/dL - - -  Alkaline Phos 39 - 117 U/L - - -  AST 0 - 37 U/L - - -  ALT 0 - 35 U/L - - -    CBC Latest Ref Rng & Units 02/24/2020 02/21/2020 02/20/2020  WBC 4.0 - 10.5 K/uL 3.9(L) 5.2 5.2  Hemoglobin 12.0 - 15.0 g/dL 13.7 14.6 13.2  Hematocrit 36 - 46 % 47.6(H) 46.9(H) 44.0  Platelets 150 - 400 K/uL 132(L) 136(L) 113(L)    ABG    Component Value Date/Time   PHART 7.472 (H) 02/20/2020 0501   PCO2ART 50.8 (H) 02/20/2020 0501   PO2ART 76 (L) 02/20/2020 0501   HCO3 36.8 (H) 02/20/2020 0501   TCO2 38 (H) 02/20/2020 0501   O2SAT 95.0 02/20/2020 0501    CBG (last 3)  Recent Labs    02/25/20 0020 02/25/20 0404 02/25/20 0729  GLUCAP 109* 94 90       Care time 35 mins.    MEulis Foster MD  Family Medicine Resident, PGY-2  02/25/2020, 9:10 AM

## 2020-02-25 NOTE — Progress Notes (Signed)
NAME:  [name] [name], MRN:  [id number], DOB:  1981/08/23, LOS: 7 ADMISSION DATE:  02/17/2020, CONSULTATION DATE:  02/18/20  REFERRING MD:  Dr Lorin Mercy, CHIEF COMPLAINT:  Dyspnea, cough, leg swelling   Brief History   39 yo female with OSA/OHS and chronic tracheostomy since 2017 presented to ER with dyspnea, cough and edema.  Found to have community acquired pneumonia and cellulitis.  Developed encephalopathy from worsening hypoxia/hypercapnia.  Transferred to ICU and placed on ventilator. Patient is now using ventilator at night and tracheostomy collar during the day.   Past Medical History   OSA/OHS  Chronic hypoxic/hypercapnic respiratory failure  Tracheostomy status  Chronic diastolic CHF  CKD 4  DM type 2  HTN  Pulmonary hypertension  Lymphedema  Significant Hospital Events   6/28 Admit 6/29 had #6 ETT placed through trach stoma to assist with mechanical ventilation; ENT consulted to upsize trach 7/1: Started diet.  Physically feeling better.  Starting to mobilize. 7/2: patient in bedside chair, attempting to wean FiO2  Consults:  ENT  Procedures:  ETT through trach stoma 6/29 >> 6/29 #6 cuffed trach 6/29 >>   Significant Diagnostic Tests:   CT chest 6/28 >> bilateral basilar scattered infiltrates/atelectasis, cardiomegaly, evidence for secondary PAH  Doppler legs b/l 6/28 >> limited study, no obvious DVT  Micro Data:  SARS CoV2 6/28 >> negative MRSA PCR 6/28 >> negative Blood 6/28 >> neg   Antimicrobials:  Azithro 6/28 x1 Ceftriaxone 6/28 x 1 Cefepime 6/28 >> 7/2  Interim history/subjective:  Doing well! Continues on trach collar. Denies pain.  Objective   Blood pressure 110/72, pulse 86, temperature 98.1 F (36.7 C), temperature source Oral, resp. rate 15, height _0  (1.651 m), weight (!) 174.2 kg, SpO2 100 %, unknown if currently breastfeeding.    FiO2 (%):  [60 %] 60 %   Intake/Output Summary (Last 24 hours) at 02/25/2020 8891 Last data filed at  02/24/2020 2000 Gross per 24 hour  Intake 600 ml  Output 400 ml  Net 200 ml   Filed Weights   02/22/20 0500 02/23/20 0400 02/25/20 0345  Weight: (!) 183.1 kg (!) 174 kg (!) 174.2 kg   Examination: GEN: no acute distress HEENT: trach in place, small bloody secretions CV: RRR, ext warm PULM: Scattered rhonci, no accessory muscle use GI: Soft, +BS  EXT: Chronic lymphedema NEURO: moves all 4 ext to command PSYCH: RASS 0 SKIN: LLE wrapped  Cr/K slightly up   Resolved Hospital Problem list   Acute metabolic encephalopathy  Assessment & Plan:   Acute on chronic hypoxic/hypercapnic respiratory failure 2/2 CAP Acute pulmonary edema due to acute diastolic heart failure in setting of OSA/OHS with chronic tracheostomy. -Cont ATC as tolerated  HTN- PTA meds as ordered  Sepsis 2nd to possible CAP and cellulitis with lymphedema. All culture data negative Completed 5 days of cefepime for CAP treatment  Continue wound care lower extremities  Acute on chronic KD, stage 2 Monitor BMP, Cr relatively stable - Monitor, no diuresis or fluids today and see where it settles out  Thrombocytopenia -stable in 130's  Depression. Continue Celexa 33m daily   Nutrition. Heart healthy diet  -pt with poor appetite   Best practice:  Diet: heart healthy diet  DVT prophylaxis: SQ heparin GI prophylaxis: protonix Mobility: encourage patient OOB with PT  Code Status: Full Disposition: okay for progressive, continued issues are renal function, physical therapy, and making sure tolerates PMV and trach collar    DErskine EmeryMD [place]  Pulmonary Critical Care 02/25/2020 9:10 AM Personal pager: [phone] If unanswered, please page CCM On-call: [phone]

## 2020-02-25 NOTE — Progress Notes (Signed)
Physical Therapy Treatment Patient Details Name: [name] [name] MRN: [id number] DOB: January 25, 1981 Today's Date: 02/25/2020    History of Present Illness Pt is a 39 year old woman admitted on 02/18/20 with CAP and cellulitis. Developed AMS and acute repiratory failure requiring ventilation. PMH: morbid obesity, chronic trach, CKD II, CHF, DM, HTN, pulmonary HTN, lymphedema.    PT Comments    Added getting outside the hospital and longer distance ambulation to the session without supplemental oxygen or and AD.  Sats maintained >=93%    Follow Up Recommendations  Home health PT     Equipment Recommendations  Other (comment);None recommended by PT    Recommendations for Other Services       Precautions / Restrictions Precautions Precautions: Fall (minimal risk) Precaution Comments: watch SpO2    Mobility  Bed Mobility               General bed mobility comments: up in the chair on arrival  Transfers Overall transfer level: Needs assistance   Transfers: Sit to/from Stand Sit to Stand: Supervision            Ambulation/Gait Ambulation/Gait assistance: Supervision Gait Distance (Feet): 800 Feet (x2) Assistive device: None Gait Pattern/deviations: Step-through pattern Gait velocity: slower Gait velocity interpretation: <1.8 ft/sec, indicate of risk for recurrent falls General Gait Details: generally steady, mildly dyspneic, but sats on RA staying >=93%   Stairs             Wheelchair Mobility    Modified Rankin (Stroke Patients Only)       Balance Overall balance assessment: Needs assistance   Sitting balance-Leahy Scale: Good       Standing balance-Leahy Scale: Good                              Cognition Arousal/Alertness: Awake/alert Behavior During Therapy: WFL for tasks assessed/performed Overall Cognitive Status: Within Functional Limits for tasks assessed                                         Exercises      General Comments        Pertinent Vitals/Pain Pain Assessment: Faces Faces Pain Scale: No hurt Pain Intervention(s): Monitored during session    Home Living                      Prior Function            PT Goals (current goals can now be found in the care plan section) Acute Rehab PT Goals PT Goal Formulation: With patient Time For Goal Achievement: 03/06/20 Potential to Achieve Goals: Good Progress towards PT goals: Progressing toward goals    Frequency    Min 3X/week      PT Plan Current plan remains appropriate    Co-evaluation              AM-PAC PT "6 Clicks" Mobility   Outcome Measure  Help needed turning from your back to your side while in a flat bed without using bedrails?: A Little Help needed moving from lying on your back to sitting on the side of a flat bed without using bedrails?: A Little Help needed moving to and from a bed to a chair (including a wheelchair)?: A Little Help needed standing up from a chair using your  arms (e.g., wheelchair or bedside chair)?: None Help needed to walk in hospital room?: A Little Help needed climbing 3-5 steps with a railing? : A Little 6 Click Score: 19    End of Session   Activity Tolerance: Patient tolerated treatment well Patient left: in chair;with call bell/phone within reach Nurse Communication: Mobility status PT Visit Diagnosis: Other abnormalities of gait and mobility (R26.89);Difficulty in walking, not elsewhere classified (R26.2)     Time: 2258-3462 PT Time Calculation (min) (ACUTE ONLY): 28 min  Charges:  $Gait Training: 8-22 mins $Therapeutic Activity: 8-22 mins                     02/25/2020  [name] Carne., PT Acute Rehabilitation Services [phone]  (pager) [phone]  (office)   [name] [name] [name] [name] 02/25/2020, 2:58 PM

## 2020-02-25 NOTE — Progress Notes (Signed)
Orthopedic Tech Progress Note Patient Details:  [name] [name] [birth year] [id number]  Ortho Devices Type of Ortho Device: Haematologist Ortho Device/Splint Location: LLE Ortho Device/Splint Interventions: Ordered, Application   Post Interventions Patient Tolerated: Well Instructions Provided: Care of device   [name] 02/25/2020, 6:04 PM

## 2020-02-25 NOTE — Evaluation (Signed)
[facility] Speaking Valve - Evaluation Patient Details  Name: NELVA HAUK MRN: [id number] Date of Birth: [birth year]  Today's Date: 02/25/2020 Time: 4580-9983 SLP Time Calculation (min) (ACUTE ONLY): 25 min  Past Medical History:  Past Medical History:  Diagnosis Date  . Chronic diastolic heart failure ([place])    Echo 10/12: EF 50-55 // b. Echo 10/17: EF 50-55, mild BAE, PASP 42 // Echo 10/18: Mild concentric LVH, EF 55-60, normal wall motion, mild MAC, severe LAE, mild TR, PASP 15, trivial pericardial effusion  . CKD (chronic kidney disease) stage 4, GFR 15-29 ml/min (H[facility]) 10/25/2016   admitted in 2/18 with SCr of 10 in the setting of dehydration from illness, etc >> improved to 5 at DC  . DM2 (diabetes mellitus, type 2) (HCC)    A1c 7.2 in 05/2016  . Gestational diabetes mellitus in pregnancy 05/24/2013  . HTN in pregnancy, chronic 05/24/2013  . Hypertensive heart disease with CHF (congestive heart failure) ([place]) 05/25/2016  . Morbid obesity ([place])   . OSA (obstructive sleep apnea) 07/09/2011   s/p Trach 2/2 prolonged respiratory failure during admx for CHF in 05/2016  . Pulmonary hypertension ([place])    secondary from CHF/OSA  . Sinus tachycardia    Past Surgical History:  Past Surgical History:  Procedure Laterality Date  . TRACHEOSTOMY TUBE PLACEMENT N/A 06/11/2016   Procedure: TRACHEOSTOMY;  Surgeon: Melida Quitter, MD;  Location: [facility];  Service: ENT;  Laterality: N/A;   HPI:  39 yo female with OSA/OHS and chronic tracheostomy since 2017 presented to ER 6/28 with dyspnea, cough and edema.  Found to have community acquired pneumonia and cellulitis.  Developed encephalopathy from worsening hypoxia/hypercapnia.  Transferred to ICU and placed on ventilator. Patient is now using ventilator at night and tracheostomy collar during the day.  Has #6 cuffed trach, cuff deflated. Known to SLP services from prior admissions dating back to 2017. Hx of "stenosis and small vocal folds" per notes.   Most recent evaluation was December 2020, at which time PMV was tolerated quite well.  Voice quality somewhat breathy at baseline.  Pt independent with valve use.    Assessment / Plan / Recommendation Clinical Impression  Pt demonstrates excellent toleration of PMV.  VS were stable during its use - RR 15-18, Sp02 97-100%.  Pt able to place and remove valve independently.  Reviewed critical importance that cuff is deflated prior to valve placement, and we watched two short videos from [place] that help explain changes in airflow and purpose of cuff and cuff deflation.  Pt verbalized understanding.  She asserts that her voice is at baseline; quality is breathy, with breaks in phonation; she is fully intelligible. Pt is independent with valve.  D/W RN.  There are no further acute care SLP needs - our service will sign off.  SLP Visit Diagnosis: Aphonia (R49.1)    SLP Assessment  Patient does not need any further Speech Lanaguage Pathology Services    Follow Up Recommendations  None    Frequency and Duration         PMSV Trial PMSV was placed for: 12 minutes Able to redirect subglottic air through upper airway: Yes Able to Attain Phonation: Yes Voice Quality: Breathy Able to Expectorate Secretions: No attempts Breath Support for Phonation: Adequate Intelligibility: Intelligible Respirations During Trial: 18 SpO2 During Trial: 100 % Pulse During Trial: 77 Behavior: Alert;Controlled   Tracheostomy Tube  Additional Tracheostomy Tube Assessment Fenestrated: No    Vent Dependency  Vent Dependent: No FiO2 (%):  60 %    Cuff Deflation Trial  GO Tolerated Cuff Deflation:  (deflated at baseline) Behavior: Alert;Controlled        Juan Quam Laurice 02/25/2020, 2:29 PM Estill Bamberg L. Tivis Ringer, [place] Office number [phone] Pager [phone]

## 2020-02-26 ENCOUNTER — Inpatient Hospital Stay (HOSPITAL_COMMUNITY): Payer: Medicare Other

## 2020-02-26 LAB — BASIC METABOLIC PANEL
Anion gap: 8 (ref 5–15)
BUN: 33 mg/dL — ABNORMAL HIGH (ref 6–20)
CO2: 32 mmol/L (ref 22–32)
Calcium: 9.3 mg/dL (ref 8.9–10.3)
Chloride: 97 mmol/L — ABNORMAL LOW (ref 98–111)
Creatinine, Ser: 1.53 mg/dL — ABNORMAL HIGH (ref 0.44–1.00)
GFR calc Af Amer: 49 mL/min — ABNORMAL LOW (ref >60)
GFR calc non Af Amer: 42 mL/min — ABNORMAL LOW (ref >60)
Glucose, Bld: 115 mg/dL — ABNORMAL HIGH (ref 70–99)
Potassium: 5.1 mmol/L (ref 3.5–5.1)
Sodium: 137 mmol/L (ref 135–145)

## 2020-02-26 LAB — GLUCOSE, CAPILLARY
Glucose-Capillary: 105 mg/dL — ABNORMAL HIGH (ref 70–99)
Glucose-Capillary: 106 mg/dL — ABNORMAL HIGH (ref 70–99)
Glucose-Capillary: 111 mg/dL — ABNORMAL HIGH (ref 70–99)
Glucose-Capillary: 114 mg/dL — ABNORMAL HIGH (ref 70–99)
Glucose-Capillary: 121 mg/dL — ABNORMAL HIGH (ref 70–99)
Glucose-Capillary: 135 mg/dL — ABNORMAL HIGH (ref 70–99)

## 2020-02-26 LAB — MAGNESIUM: Magnesium: 2.3 mg/dL (ref 1.7–2.4)

## 2020-02-26 MED ORDER — AMLODIPINE BESYLATE 5 MG PO TABS
5.0000 mg | ORAL_TABLET | Freq: Every day | ORAL | Status: DC
  Administered 2020-02-27: 5 mg via ORAL
  Filled 2020-02-26: qty 1

## 2020-02-26 NOTE — Progress Notes (Addendum)
PROGRESS NOTE    [name] [name]  ZHG:[id number] DOB: 01-29-1981 DOA: 02/17/2020 PCP: Flossie Buffy, NP    Brief Narrative: [place] pickup 02/26/2020 Patient has been in hospital for 71 days 39 year old female with chronic tracheostomy, obstructive sleep apnea with encephalopathy secondary to hypoxia hypercapnia was placed on ventilator and treated for community-acquired pneumonia and cellulitis.  No on ventilator at night and tracheostomy collar during the day. Patient was admitted on 02/18/2020.   Had ET tube placed through trach stoma 02/19/2020 ENT was consulted for upsizing the trach. CT chest bilateral basilar infiltrates versus atelectasis Lower extremity Doppler no DVT Covid negative On cefepime since 02/18/2020  Assessment & Plan:   Principal Problem:   Acute respiratory failure with hypoxia and hypercapnia (HCC) Active Problems:   Morbid obesity ([place])   Diabetes mellitus ([place])   Acute renal failure superimposed on stage 3 chronic kidney disease ([place])   Tracheostomy dependence ([place])   Hypertension   (HFpEF) heart failure with preserved ejection fraction (HCC)   Cellulitis    #1 acute on chronic hypoxic/hypercapnic respiratory failure/acute pulmonary edema due to acute heart failure/community-acquired pneumonia/obstructive sleep apnea with chronic trach- She is negative by 14 L. Currently not on diuretics due to creatinine trending up.  Monitor on a daily basis and restart diuretics as needed. Chest x-ray from 02/23/2020 stable hazy perihilar opacification bilaterally suggesting interstitial edema although infection is possible. Repeat chest x-ray today. PCCM following.  #2 sepsis secondary to cellulitis and community-acquired pneumonia finished course of cefepime for 5 days.  Continue Unna boots  #3 acute on chronic kidney disease stage II-creatinine trending up 1.53.  Diuretics on hold monitor on a day-to-day basis and restart diuretics as needed.  #4 essential  hypertension-on multiple medications at home including Norvasc 10 mg daily, Coreg 25 twice daily, Lasix 40 mg daily, hydralazine 100 mg 3 times a day, Isordil 30 mg 3 times a day, Zestril 40 mg daily. Currently on Norvasc 10 mg daily with Coreg 25 twice daily, hydralazine 100 mg every 8, Isordil 30 mg 3 times a day. Blood pressure has been soft 105/65 109/63 heart rates in the 80s. Decrease Norvasc to 5 mg daily-consider taking her off Norvasc with chronic lower extremity edema.  #5 depression continue antidepressant Celexa.  #6 mild thrombocytopenia platelets 132 stable   Estimated body mass index is 63.91 kg/m as calculated from the following:   Height as of this encounter: _0  (1.651 m).   Weight as of this encounter: 174.2 kg.  DVT prophylaxis: Subcu heparin Code Status: Full code Family Communication: None at bedside Disposition Plan:  Status is: Inpatient  Dispo: The patient is from: Home              Anticipated d/c is to: Home              Anticipated d/c date is: Unknown              Patient currently is not medically stable to d/c.  Patient admitted with acute respiratory failure still on vent at night and trach collar during the day.  PT has seen the patient recommended home health PT.   Consultants:   PCCM ENT  Procedures: Upsizing of the trach ETT Antimicrobials: Anti-infectives (From admission, onward)   Start     Dose/Rate Route Frequency Ordered Stop   02/18/20 0845  ceFEPIme (MAXIPIME) 2 g in sodium chloride 0.9 % 100 mL IVPB        2 g 200  mL/hr over 30 Minutes Intravenous Every 8 hours 02/18/20 0836 02/23/20 0030   02/18/20 0530  cefTRIAXone (ROCEPHIN) 1 g in sodium chloride 0.9 % 100 mL IVPB        1 g 200 mL/hr over 30 Minutes Intravenous  Once 02/18/20 0524 02/18/20 0604   02/18/20 0530  azithromycin (ZITHROMAX) 500 mg in sodium chloride 0.9 % 250 mL IVPB        500 mg 250 mL/hr over 60 Minutes Intravenous  Once 02/18/20 0524 02/18/20 0802       Subjective: She is sitting up in chair eating breakfast in no acute distress denies any new complaints  Objective: Vitals:   02/26/20 0700 02/26/20 0744 02/26/20 0750 02/26/20 0805  BP: 109/63 109/63 109/63   Pulse: 80 86 87   Resp: (!) 27  (!) 22   Temp:    (!) 97.5 F (36.4 C)  TempSrc:    Axillary  SpO2: 97%  100%   Weight:      Height:        Intake/Output Summary (Last 24 hours) at 02/26/2020 4709 Last data filed at 02/26/2020 0600 Gross per 24 hour  Intake 60 ml  Output --  Net 60 ml   Filed Weights   02/22/20 0500 02/23/20 0400 02/25/20 0345  Weight: (!) 183.1 kg (!) 174 kg (!) 174.2 kg    Examination:  General exam: Appears calm and comfortable  Respiratory system: Bilateral scattered rhonchi to auscultation. Respiratory effort normal. Cardiovascular system: S1 & S2 heard, RRR. No JVD, murmurs, rubs, gallops or clicks. No pedal edema. Gastrointestinal system: Abdomen is nondistended, soft and nontender. No organomegaly or masses felt. Normal bowel sounds heard. Central nervous system: Alert and oriented. No focal neurological deficits. Extremities: 2-3+ pitting edema with Unna boots on  skin: No rashes, lesions or ulcers Psychiatry: Judgement and insight appear normal. Mood & affect appropriate.     Data Reviewed: I have personally reviewed following labs and imaging studies  CBC: Recent Labs  Lab 02/20/20 0501 02/20/20 0720 02/21/20 0335 02/24/20 0301  WBC  --  5.2 5.2 3.9*  NEUTROABS  --   --   --  2.0  HGB 13.9 13.2 14.6 13.7  HCT 41.0 44.0 46.9* 47.6*  MCV  --  89.6 87.0 92.4  PLT  --  113* 136* 628*   Basic Metabolic Panel: Recent Labs  Lab 02/21/20 0335 02/21/20 0335 02/22/20 0241 02/23/20 0330 02/24/20 0301 02/25/20 0325 02/26/20 0405  NA 144   < > 138 140 138 135 137  K 3.4*   < > 3.7 4.2 4.6 5.3* 5.1  CL 97*   < > 92* 96* 98 97* 97*  CO2 33*   < > 36* 32 35* 28 32  GLUCOSE 85   < > 101* 87 96 98 115*  BUN 20   < > 25* 30* 28*  31* 33*  CREATININE 1.20*   < > 1.26* 1.34* 1.35* 1.48* 1.53*  CALCIUM 8.9   < > 8.5* 8.8* 8.8* 8.8* 9.3  MG 1.6*  --   --  2.0  --   --  2.3   < > = values in this interval not displayed.   GFR: Estimated Creatinine Clearance: 80.2 mL/min (A) (by C-G formula based on SCr of 1.53 mg/dL (H)). Liver Function Tests: No results for input(s): AST, ALT, ALKPHOS, BILITOT, PROT, ALBUMIN in the last 168 hours. No results for input(s): LIPASE, AMYLASE in the last 168 hours. No results for  input(s): AMMONIA in the last 168 hours. Coagulation Profile: No results for input(s): INR, PROTIME in the last 168 hours. Cardiac Enzymes: No results for input(s): CKTOTAL, CKMB, CKMBINDEX, TROPONINI in the last 168 hours. BNP (last 3 results) No results for input(s): PROBNP in the last 8760 hours. HbA1C: No results for input(s): HGBA1C in the last 72 hours. CBG: Recent Labs  Lab 02/25/20 1517 02/25/20 1959 02/25/20 2351 02/26/20 0356 02/26/20 0803  GLUCAP 130* 121* 116* 114* 105*   Lipid Profile: No results for input(s): CHOL, HDL, LDLCALC, TRIG, CHOLHDL, LDLDIRECT in the last 72 hours. Thyroid Function Tests: No results for input(s): TSH, T4TOTAL, FREET4, T3FREE, THYROIDAB in the last 72 hours. Anemia Panel: No results for input(s): VITAMINB12, FOLATE, FERRITIN, TIBC, IRON, RETICCTPCT in the last 72 hours. Sepsis Labs: No results for input(s): PROCALCITON, LATICACIDVEN in the last 168 hours.  Recent Results (from the past 240 hour(s))  SARS Coronavirus 2 by RT PCR (hospital order, performed in [facility] hospital lab) Nasopharyngeal Nasopharyngeal Swab     Status: None   Collection Time: 02/18/20  2:30 AM   Specimen: Nasopharyngeal Swab  Result Value Ref [place] Status   SARS Coronavirus 2 NEGATIVE NEGATIVE Final    Comment: (NOTE) SARS-CoV-2 target nucleic acids are NOT DETECTED.  The SARS-CoV-2 RNA is generally detectable in upper and lower respiratory specimens during the acute phase of  infection. The lowest concentration of SARS-CoV-2 viral copies this assay can detect is 250 copies / mL. A negative result does not preclude SARS-CoV-2 infection and should not be used as the sole basis for treatment or other patient management decisions.  A negative result may occur with improper specimen collection / handling, submission of specimen other than nasopharyngeal swab, presence of viral mutation(s) within the areas targeted by this assay, and inadequate number of viral copies (<250 copies / mL). A negative result must be combined with clinical observations, patient history, and epidemiological information.  Fact Sheet for Patients:   StrictlyIdeas.no  Fact Sheet for Healthcare Providers: BankingDealers.co.za  This test is not yet approved or  cleared by the [place] FDA and has been authorized for detection and/or diagnosis of SARS-CoV-2 by FDA under an Emergency Use Authorization (EUA).  This EUA will remain in effect (meaning this test can be used) for the duration of the COVID-19 declaration under Section 564(b)(1) of the Act, 21 U.S.C. section 360bbb-3(b)(1), unless the authorization is terminated or revoked sooner.  Performed at [facility], [place] [address] [place], [place] [address]   Blood culture (routine x 2)     Status: None   Collection Time: 02/18/20  5:45 AM   Specimen: BLOOD LEFT HAND  Result Value Ref [place] Status   Specimen Description BLOOD LEFT HAND  Final   Special Requests   Final    BOTTLES DRAWN AEROBIC AND ANAEROBIC Blood Culture results may not be optimal due to an inadequate volume of blood received in culture bottles   Culture   Final    NO GROWTH 5 DAYS Performed at [facility], [place] [address] [place], [place] [address]    Report Status 02/23/2020 FINAL  Final  Blood culture (routine x 2)     Status: None   Collection Time: 02/18/20  5:50 AM   Specimen: BLOOD RIGHT HAND   Result Value [place] Status   Specimen Description BLOOD RIGHT HAND  Final   Special Requests   Final    BOTTLES DRAWN AEROBIC AND ANAEROBIC  Blood Culture results may not be optimal due to an inadequate volume of blood received in culture bottles   Culture   Final    NO GROWTH 5 DAYS Performed at [place] [address], [place], [place] [address]    Report Status 02/23/2020 FINAL  Final  MRSA PCR Screening     Status: None   Collection Time: 02/18/20  1:52 PM   Specimen: Nasal Mucosa; Nasopharyngeal  Result Value Ref [place] Status   MRSA by PCR NEGATIVE NEGATIVE Final    Comment:        The GeneXpert MRSA Assay (FDA approved for NASAL specimens only), is one component of a comprehensive MRSA colonization surveillance program. It is not intended to diagnose MRSA infection nor to guide or monitor treatment for MRSA infections. Performed at [facility], [place] [address] [place], [place] [address]          Radiology Studies: No results found.      Scheduled Meds: . amLODipine  10 mg Oral Daily  . carvedilol  25 mg Oral BID WC  . chlorhexidine gluconate (MEDLINE KIT)  15 mL Mouth Rinse BID  . Chlorhexidine Gluconate Cloth  6 each Topical Daily  . citalopram  10 mg Oral Daily  . heparin  5,000 Units Subcutaneous Q8H  . hydrALAZINE  100 mg Oral Q8H  . insulin aspart  2-6 Units Subcutaneous Q4H  . isosorbide dinitrate  30 mg Oral TID  . mouth rinse  15 mL Mouth Rinse q12n4p  . pantoprazole  40 mg Oral Q1200  . sodium chloride flush  10-40 mL Intracatheter Q12H   Continuous Infusions:   LOS: 8 days     Georgette Shell, MD 02/26/2020, 9:23 AM

## 2020-02-26 NOTE — Progress Notes (Signed)
Occupational Therapy Treatment Patient Details Name: [name] [name] MRN: [id number] DOB: [birth year] Today's Date: 02/26/2020    History of present illness Pt is a 39 year old woman admitted on 02/18/20 with CAP and cellulitis. Developed AMS and acute repiratory failure requiring ventilation. PMH: morbid obesity, chronic trach, CKD II, CHF, DM, HTN, pulmonary HTN, lymphedema.   OT comments  Pt progressing toward established OT goals. Pt sitting in recliner upon OT arrival. Pt currently requires supervision for functional mobility without AD and supervision for toileting and grooming while standing at sink level. Pt on 5L/min 35% FiO2 upon arrival and throughout session, O2 maintained >94%. Vitals within normal range throughout session. Educated pt on energy conservation strategies with provided handout. Pt will continue to benefit from skilled OT services to maximize safety and independence with ADL/IADL and functional mobility. Will continue to follow acutely and progress as tolerated.    Follow Up Recommendations  Home health OT    Equipment Recommendations  Tub/shower seat (bariatric)    Recommendations for Other Services      Precautions / Restrictions Precautions Precautions: Fall Precaution Comments: watch SpO2 Restrictions Weight Bearing Restrictions: No       Mobility Bed Mobility Overal bed mobility: Needs Assistance             General bed mobility comments: in recliner upon arrival  Transfers Overall transfer level: Needs assistance   Transfers: Sit to/from Stand Sit to Stand: Supervision         General transfer comment: supervision for safety and lines    Balance Overall balance assessment: Needs assistance Sitting-balance support: No upper extremity supported;Feet supported Sitting balance-Leahy Scale: Good     Standing balance support: No upper extremity supported Standing balance-Leahy Scale: Good Standing balance comment: ambulating without  AD;washed hadns at sink without AD                           ADL either performed or assessed with clinical judgement   ADL Overall ADL's : Needs assistance/impaired     Grooming: Supervision/safety;Standing;Wash/dry hands                   Toilet Transfer: Supervision/safety;Ambulation Toilet Transfer Details (indicate cue type and reason): supervision to ambulated to bathroom and use regular commode Toileting- Clothing Manipulation and Hygiene: Supervision/safety;Sit to/from stand       Functional mobility during ADLs: Supervision/safety General ADL Comments: supervision for functional mobility without AD;educated pt on energy conservation strategies with provided handout     Vision       Perception     Praxis      Cognition Arousal/Alertness: Awake/alert Behavior During Therapy: WFL for tasks assessed/performed Overall Cognitive Status: Within Functional Limits for tasks assessed                                          Exercises     Shoulder Instructions       General Comments vss throughout session    Pertinent Vitals/ Pain       Pain Assessment: No/denies pain Pain Intervention(s): Monitored during session  Home Living  Prior Functioning/Environment              Frequency  Min 2X/week        Progress Toward Goals  OT Goals(current goals can now be found in the care plan section)  Progress towards OT goals: Progressing toward goals  Acute Rehab OT Goals Patient Stated Goal: return home to her daughter OT Goal Formulation: With patient Time For Goal Achievement: 03/06/20 Potential to Achieve Goals: Good ADL Goals Pt Will Perform Grooming: with modified independence;standing Pt Will Perform Lower Body Bathing: with modified independence;with adaptive equipment;sit to/from stand Pt Will Perform Lower Body Dressing: with modified independence;sit  to/from stand Pt Will Transfer to Toilet: with modified independence;ambulating;regular height toilet Pt Will Perform Toileting - Clothing Manipulation and hygiene: with modified independence;sit to/from stand Additional ADL Goal #1: Pt will utilize energy conservation strategies during ADL and mobility.  Plan Discharge plan remains appropriate    Co-evaluation                 AM-PAC OT "6 Clicks" Daily Activity     Outcome Measure   Help from another person eating meals?: None Help from another person taking care of personal grooming?: A Little Help from another person toileting, which includes using toliet, bedpan, or urinal?: A Little Help from another person bathing (including washing, rinsing, drying)?: A Little Help from another person to put on and taking off regular upper body clothing?: A Little Help from another person to put on and taking off regular lower body clothing?: A Little 6 Click Score: 19    End of Session Equipment Utilized During Treatment: Oxygen  OT Visit Diagnosis: Unsteadiness on feet (R26.81);Muscle weakness (generalized) (M62.81)   Activity Tolerance Patient tolerated treatment well   Patient Left in chair;with call bell/phone within reach   Nurse Communication Mobility status        Time: 1222-4114 OT Time Calculation (min): 20 min  Charges: OT General Charges $OT Visit: 1 Visit OT Treatments $Self Care/Home Management : 8-22 mins  Helene Kelp OTR/L Acute Rehabilitation Services Office: [place] 02/26/2020, 3:47 PM

## 2020-02-27 ENCOUNTER — Telehealth: Payer: Self-pay | Admitting: Acute Care

## 2020-02-27 DIAGNOSIS — Z930 Tracheostomy status: Secondary | ICD-10-CM

## 2020-02-27 LAB — POCT I-STAT 7, (LYTES, BLD GAS, ICA,H+H)
Acid-Base Excess: 9 mmol/L — ABNORMAL HIGH (ref 0.0–2.0)
Bicarbonate: 36.8 mmol/L — ABNORMAL HIGH (ref 20.0–28.0)
Calcium, Ion: 1.32 mmol/L (ref 1.15–1.40)
HCT: 47 % — ABNORMAL HIGH (ref 36.0–46.0)
Hemoglobin: 16 g/dL — ABNORMAL HIGH (ref 12.0–15.0)
O2 Saturation: 94 %
Patient temperature: 97.8
Potassium: 5.5 mmol/L — ABNORMAL HIGH (ref 3.5–5.1)
Sodium: 140 mmol/L (ref 135–145)
TCO2: 39 mmol/L — ABNORMAL HIGH (ref 22–32)
pCO2 arterial: 62.8 mmHg — ABNORMAL HIGH (ref 32.0–48.0)
pH, Arterial: 7.374 (ref 7.350–7.450)
pO2, Arterial: 72 mmHg — ABNORMAL LOW (ref 83.0–108.0)

## 2020-02-27 LAB — BASIC METABOLIC PANEL
Anion gap: 10 (ref 5–15)
BUN: 38 mg/dL — ABNORMAL HIGH (ref 6–20)
CO2: 30 mmol/L (ref 22–32)
Calcium: 9.8 mg/dL (ref 8.9–10.3)
Chloride: 99 mmol/L (ref 98–111)
Creatinine, Ser: 1.39 mg/dL — ABNORMAL HIGH (ref 0.44–1.00)
GFR calc Af Amer: 55 mL/min — ABNORMAL LOW (ref >60)
GFR calc non Af Amer: 47 mL/min — ABNORMAL LOW (ref >60)
Glucose, Bld: 86 mg/dL (ref 70–99)
Potassium: 5.5 mmol/L — ABNORMAL HIGH (ref 3.5–5.1)
Sodium: 139 mmol/L (ref 135–145)

## 2020-02-27 LAB — GLUCOSE, CAPILLARY
Glucose-Capillary: 86 mg/dL (ref 70–99)
Glucose-Capillary: 84 mg/dL (ref 70–99)

## 2020-02-27 LAB — MAGNESIUM: Magnesium: 2.3 mg/dL (ref 1.7–2.4)

## 2020-02-27 MED ORDER — AMLODIPINE BESYLATE 5 MG PO TABS: 5.0000 mg | ORAL_TABLET | Freq: Every day | ORAL | 3 refills | Status: DC

## 2020-02-27 MED ORDER — CITALOPRAM HYDROBROMIDE 10 MG PO TABS: 10.0000 mg | ORAL_TABLET | Freq: Every day | ORAL | 3 refills | Status: DC

## 2020-02-27 NOTE — Care Management Important Message (Signed)
Important Message  Patient Details  Name: [name] [name] MRN: [id number] Date of Birth: Mar 05, 1981   Medicare Important Message Given:  Yes     Orbie Pyo 02/27/2020, 12:59 PM

## 2020-02-27 NOTE — Progress Notes (Signed)
Trach instructions provided to patient before discharge since this is a different trach than her previous one she had at home. Explained that inner cannula must be taken out & cleaned twice a day, it is NOT disposable. Also showed how to remove/replace inner cannula correctly.

## 2020-02-27 NOTE — Procedures (Signed)
Tracheostomy Change Note  Patient Details:   Name: [name] [name] DOB: [birth year] MRN: [id number]    Airway Documentation: 6 cuffed shiley changed to 6 cuffless shiley for patient to DC home. Positive ETCO2 detector color change. Patient also coughing out moderate amount of secretions through new trach & able to speak with PMV.  Evaluation  O2 sats: stable throughout Complications: No apparent complications Patient did tolerate procedure well. Bilateral Breath Sounds: Diminished, Rhonchi    Kathie Dike 02/27/2020, 12:16 PM

## 2020-02-27 NOTE — Progress Notes (Signed)
Discharge summary reviewed with patient, no more questions at this time. IV removed and patient discharged via wheelchair to private car.

## 2020-02-27 NOTE — Discharge Summary (Signed)
Physician Discharge Summary  ALLIX BLOMQUIST YSH:[id number] DOB: 1981/05/12 DOA: 02/17/2020  PCP: Flossie Buffy, NP  Admit date: 02/17/2020 Discharge date: 02/27/2020  Admitted From: Home  Disposition:  Home with [facility]  Recommendations for Outpatient Follow-up:  1. Follow up with Pulmonology in 1-2 weeks 2. Please obtain BMP/CBC in one week  Home Health: PT/OT due to shortness of breath with exertion  Equipment/Devices: None  Discharge Condition: Fair  CODE STATUS: FULL Diet recommendation: Low calorie  Brief/Interim Summary: Mrs. Pardi is a 39 y.o. 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     PRINCIPAL HOSPITAL DIAGNOSIS: Acute hypoxic and hypercarbic respiratory failure and pneumonia, CHF    Discharge Diagnoses:   Acute hypoxic and hypercarbic respiratory failure due to pneumonia and CHF Patient admitted and started on cefepime and diuretics.  Folded and her tracheostomy was upsized to 6 on 6/29, she was able to be taken off the ventilator, and gradually improved.  She completed 6 days cefepime.  At present she feels at baseline, her oxygen has been weaned to her baseline, and she has her normal level of exertional tolerance.  Case was discussed with CCM, will help arrange close follow-up.           Discharge Instructions  Discharge Instructions    Diet - low sodium heart healthy   Complete by: As directed    Discharge instructions   Complete by: As directed    From Dr. Loleta Books: You were admitted for lung failure from a pneumonia (a lung infection) on top of your chronic lung disease  You were treated with antibiotics and this got better.  You should follow up with the pulmonary clinic in 2-4 weeks Call them if you haven't heard from them in 1 week  For your blood  pressure and heart: Continue your carvedilol, hydralazine, Isordil, Lasix/furosemide without change starting today when you get home  LOWER your amlodipine dose to 5 mg daily  HOLD (do not take) your lisinopril until you see Cecille Rubin Gerhardt's office in 2-4 weeks I have snet them a message, if you haven't heard from them in 1 week, call  Take the new mood medicine citalopram and follow up with Dr. Lorayne Marek in 1-2 months   Increase activity slowly   Complete by: As directed    No wound care   Complete by: As directed      Allergies as of 02/27/2020      Reactions   No Known Allergies       Medication List    STOP taking these medications   benzonatate 100 MG capsule Commonly known as: TESSALON   lisinopril 40 MG tablet Commonly known as: ZESTRIL     TAKE these medications   amLODipine 5 MG tablet Commonly known as: NORVASC Take 1 tablet (5 mg total) by mouth daily. What changed:   medication strength  how much to take   carvedilol 25 MG tablet Commonly known as: COREG Take 1 tablet (25 mg total) by mouth 2 (two) times daily.   cetirizine 10 MG tablet Commonly known as: ZYRTEC Take 1 tablet (10 mg total) by mouth at bedtime.   citalopram 10 MG tablet Commonly known as: CELEXA Take 1 tablet (10 mg total) by mouth daily.   fluticasone 50 MCG/ACT nasal spray Commonly known as: Flonase Place 2 sprays into both nostrils  2 (two) times daily.   furosemide 40 MG tablet Commonly known as: LASIX TAKE 1 TABLET BY MOUTH EVERY DAY   hydrALAZINE 100 MG tablet Commonly known as: APRESOLINE TAKE 1 TABLET BY MOUTH THREE TIMES A DAY   isosorbide dinitrate 30 MG tablet Commonly known as: ISORDIL TAKE 1 TABLET BY MOUTH THREE TIMES A DAY            Durable Medical Equipment  (From admission, onward)         Start     Ordered   02/27/20 1157  For home use only DME Other see comment  Once       Comments: Heat moisture exchanger  Question:  Length of Need  Answer:   Lifetime   02/27/20 1156   02/27/20 1107  For home use only DME oxygen  Once       Comments: Needs Aresoled trach collar at night. 35% And OK to use [place] during day. 4-6 liters w/ activity  Question Answer Comment  Length of Need 6 Months   Mode or (Route) Mask   Liters per Minute -2   Frequency Continuous (stationary and portable oxygen unit needed)   Oxygen conserving device No   Oxygen delivery system Gas      02/27/20 1107   02/27/20 1104  For home use only DME Trach supplies  Once        02/27/20 1107   02/27/20 1103  For home use only DME Trach supplies  Once       Question Answer Comment  Trach Type [place]   Size 6   Cuffed or Uncuffed Uncuffed   Fenestrated No   Does patient need speaking valve? Yes   Trach Humidification Yes   If Oxygen Bleed In (lpm) 4-6   Suction Needed? Yes   Manual Resuscitator Needed? No   Emergency Backup Trach size (one size smaller than trach in throat) # 4 cuffless      02/27/20 1107          Follow-up Information    Nche, Charlene Brooke, NP. Go on 03/13/2020.   Specialty: Internal Medicine Why: at 11:00am for hospital follow-up Contact information: [address] [place] [address] [facility].   Why: Home health nurse, physical therapy and occupational therapy to follow up with you at home.  They will call you for an appointment.  Contact information: Phone:  [phone]             Allergies  Allergen Reactions  . No Known Allergies     Consultations:  CCM   Procedures/Studies: CT Chest W Contrast  Result Date: 02/18/2020 CLINICAL DATA:  Increased shortness of breath. EXAM: CT CHEST WITH CONTRAST TECHNIQUE: Multidetector CT imaging of the chest was performed during intravenous contrast administration. CONTRAST:  64m OMNIPAQUE IOHEXOL 300 MG/ML  SOLN COMPARISON:  08/06/2019 chest CTA FINDINGS: Cardiovascular: Cardiomegaly and enlarged main  pulmonary artery a 5.2 cm. There is known pulmonary hypertension. No acute vascular finding. No pericardial effusion. Mediastinum/Nodes: No noted adenopathy.  Tracheostomy tube in place. Lungs/Pleura: Patchy streaky and consolidative opacities mainly in the lower lobes. No edema, effusion, or pneumothorax. Mosaic attenuation of lungs in areas not affected by consolidation, history of pulmonary hypertension suggesting small airways disease. Upper Abdomen: No acute finding Musculoskeletal: Spondylosis with multi-level ankylosis. IMPRESSION: 1. Pneumonia/atelectasis in the dependent lungs. 2. Cardiomegaly and pulmonary hypertension. Electronically Signed   By:  Monte Fantasia M.D.   On: 02/18/2020 05:12   DG Chest Port 1 View  Result Date: 02/26/2020 CLINICAL DATA:  Provided history: Hypoxia. Additional history provided: History of CHF, diabetes, hypertension. EXAM: PORTABLE CHEST 1 VIEW COMPARISON:  Prior chest radiograph 02/23/2020 FINDINGS: Stable position of a tracheostomy tube. Unchanged cardiomegaly. As compared to prior examination 02/23/2020, there is a similar to slightly improved appearance of bilateral interstitial prominence greatest within the mid to lower lung field. No definite pleural effusion. No evidence of pneumothorax. No acute bony abnormality identified. IMPRESSION: Unchanged cardiomegaly. Similar to slightly improved bilateral interstitial prominence, greatest within the mid to lower lung fields. Findings are nonspecific but may reflect interstitial edema or atypical/viral infection. Electronically Signed   By: Kellie Simmering DO   On: 02/26/2020 10:19   DG CHEST PORT 1 VIEW  Result Date: 02/23/2020 CLINICAL DATA:  Acute respiratory failure.  Hypoxemia. EXAM: PORTABLE CHEST 1 VIEW COMPARISON:  02/21/2020 FINDINGS: Tracheostomy tube unchanged. Lungs are adequately inflated demonstrate persistent hazy bilateral perihilar opacification likely mild degree of interstitial edema. Infection is  possible. No definite effusion. Moderate stable cardiomegaly. Remainder the exam is unchanged. IMPRESSION: Stable hazy perihilar opacification bilaterally suggesting interstitial edema although infection is possible. Stable cardiomegaly. Electronically Signed   By: Marin Olp M.D.   On: 02/23/2020 11:56   DG Chest Port 1 View  Result Date: 02/21/2020 CLINICAL DATA:  Respiratory failure. EXAM: PORTABLE CHEST 1 VIEW COMPARISON:  02/20/2020 FINDINGS: The tracheostomy tube is stable. Stable cardiac enlargement and prominent mediastinal and hilar contours. Persistent interstitial and airspace process in the lungs. No large pleural effusions or pneumothorax. IMPRESSION: Persistent diffuse interstitial and airspace process. Electronically Signed   By: Marijo Sanes M.D.   On: 02/21/2020 07:00   DG Chest Port 1 View  Result Date: 02/20/2020 CLINICAL DATA:  Respiratory failure. Tracheostomy tube placement. EXAM: PORTABLE CHEST 1 VIEW COMPARISON:  02/19/2020 FINDINGS: The tracheostomy tube is in good position. No complicating features. Stable cardiac enlargement and prominent mediastinal and hilar contours. Persistent patchy airspace disease but slight improved aeration when compared to yesterday's film. No large pleural effusions or pneumothorax. IMPRESSION: Tracheostomy tube in good position without complicating features. Slight improved lung aeration. Electronically Signed   By: Marijo Sanes M.D.   On: 02/20/2020 08:12   DG Chest Port 1 View  Result Date: 02/19/2020 CLINICAL DATA:  ET tube placement EXAM: PORTABLE CHEST 1 VIEW COMPARISON:  02/19/2020 FINDINGS: Endotracheal tube is 4 cm above the carina. Cardiomegaly with vascular congestion and diffuse bilateral airspace disease, similar prior study. No acute bony abnormality. IMPRESSION: ET tube 4 cm above the carina. Stable cardiomegaly, vascular congestion and bilateral airspace disease. Electronically Signed   By: Rolm Baptise M.D.   On: 02/19/2020 15:47    DG CHEST PORT 1 VIEW  Result Date: 02/19/2020 CLINICAL DATA:  History of endotracheal tube. EXAM: PORTABLE CHEST 1 VIEW COMPARISON:  Radiograph and CT yesterday. FINDINGS: Tracheostomy tube tip at the thoracic inlet. Stable cardiomegaly. Patchy bibasilar opacities not significantly changed by radiograph. No evidence of pneumothorax or large pleural effusion. Soft tissue attenuation from habitus limits assessment. IMPRESSION: 1. Tracheostomy tube tip at the thoracic inlet. 2. Stable cardiomegaly. 3. Patchy bibasilar opacities not significantly changed since yesterday. Electronically Signed   By: Keith Rake M.D.   On: 02/19/2020 02:27   DG Chest Port 1 View  Result Date: 02/18/2020 CLINICAL DATA:  Tracheostomy replacement EXAM: PORTABLE CHEST 1 VIEW COMPARISON:  Earlier same day FINDINGS: Tracheostomy  device overlies the trachea. Low lung volumes with patchy bilateral opacities, greatest at the lung bases. No significant pleural effusion. Cardiomegaly. IMPRESSION: Tracheostomy device is present. Low lung volumes with patchy bilateral atelectasis/consolidation, greatest at the lung bases. Electronically Signed   By: Macy Mis M.D.   On: 02/18/2020 09:06   DG Chest Portable 1 View  Result Date: 02/18/2020 CLINICAL DATA:  Shortness of breath. EXAM: PORTABLE CHEST 1 VIEW COMPARISON:  08/10/2019 FINDINGS: Tracheostomy tube tip at the thoracic inlet. Chronic cardiomegaly. Interstitial thickening suspicious for pulmonary edema, similar to prior. Poor definition the lung bases which may be due to soft tissue attenuation or pleural effusions/airspace disease. No pneumothorax. IMPRESSION: 1. Chronic cardiomegaly with interstitial thickening suspicious for pulmonary edema. 2. Poor definition of the lung bases which may be due to soft tissue attenuation or pleural effusions/airspace disease. 3. Tracheostomy tube tip at the thoracic inlet. Electronically Signed   By: Keith Rake M.D.   On: 02/18/2020  00:58   VAS [place] LOWER EXTREMITY VENOUS (DVT)  Result Date: 02/19/2020  Lower Venous DVTStudy Indications: Edema.  Limitations: Morbid obesity/ equipment interference. Comparison Study: 08/07/19 limited- negative Performing Technologist: June Leap RDMS, RVT  Examination Guidelines: A complete evaluation includes B-mode imaging, spectral Doppler, color Doppler, and power Doppler as needed of all accessible portions of each vessel. Bilateral testing is considered an integral part of a complete examination. Limited examinations for reoccurring indications may be performed as noted. The reflux portion of the exam is performed with the patient in reverse Trendelenburg.  +---------+---------------+---------+-----------+----------+--------------+ RIGHT    CompressibilityPhasicitySpontaneityPropertiesThrombus Aging +---------+---------------+---------+-----------+----------+--------------+ CFV      Full           Yes      Yes                                 +---------+---------------+---------+-----------+----------+--------------+ SFJ                                                   Not visualized +---------+---------------+---------+-----------+----------+--------------+ FV Prox  Full                                                        +---------+---------------+---------+-----------+----------+--------------+ FV Mid   Full                                                        +---------+---------------+---------+-----------+----------+--------------+ FV Distal                                             Not visualized +---------+---------------+---------+-----------+----------+--------------+ PFV  Not visualized +---------+---------------+---------+-----------+----------+--------------+ POP      Full           Yes      Yes                                  +---------+---------------+---------+-----------+----------+--------------+ PTV      Full                                                        +---------+---------------+---------+-----------+----------+--------------+ PERO                                                  Not visualized +---------+---------------+---------+-----------+----------+--------------+   +---------+---------------+---------+-----------+----------+--------------+ LEFT     CompressibilityPhasicitySpontaneityPropertiesThrombus Aging +---------+---------------+---------+-----------+----------+--------------+ CFV      Full           Yes      Yes                                 +---------+---------------+---------+-----------+----------+--------------+ SFJ                                                   Not visualized +---------+---------------+---------+-----------+----------+--------------+ FV Prox  Full                                                        +---------+---------------+---------+-----------+----------+--------------+ FV Mid                                                Not visualized +---------+---------------+---------+-----------+----------+--------------+ FV Distal                                             Not visualized +---------+---------------+---------+-----------+----------+--------------+ PFV                                                   Not visualized +---------+---------------+---------+-----------+----------+--------------+ POP      Full           Yes      Yes                                 +---------+---------------+---------+-----------+----------+--------------+ PTV  Not visualized +---------+---------------+---------+-----------+----------+--------------+ PERO                                                  Not visualized  +---------+---------------+---------+-----------+----------+--------------+     Summary: RIGHT: - Limited study. Most segments not visualized. No obvious evidence of deep vein thrombosis.  LEFT: - Limited study. Most segments not visualized. No obvious evidence of deep vein thrombosis.  *See table(s) above for measurements and observations. Electronically signed by Deitra Mayo MD on 02/19/2020 at 8:53:08 AM.    Final       Subjective: Feeling normal relative to baseline.  No fever.  Secretions are at baseline.  No chest pain, leg swelling.  Discharge Exam: Vitals:   02/27/20 1210 02/27/20 1309  BP:  126/66  Pulse: 91   Resp: (!) 25   Temp:    SpO2: 93%    Vitals:   02/27/20 1100 02/27/20 1200 02/27/20 1210 02/27/20 1309  BP:    126/66  Pulse: 74 88 91   Resp: 20 20 (!) 25   Temp:      TempSrc:      SpO2: 93% (!) 85% 93%   Weight:      Height:        General: Pt is alert, awake, not in acute distress, sitting up in recliner Cardiovascular: RRR, nl S1-S2, no murmurs appreciated.   Moderate chronic brawny edema in bilateral lower extremities. Respiratory: Normal respiratory rate and rhythm.  CTAB without rales or wheezes.  Severely diminished bilaterally MSK: The left leg is wrapped, but only mildly tender.  No surrounding redness or erythema. Abdominal: Abdomen soft and non-tender.  No distension or HSM.   Neuro/Psych: Strength symmetric in upper and lower extremities.  Judgment and insight appear normal.   The results of significant diagnostics from this hospitalization (including imaging, microbiology, ancillary and laboratory) are listed below for reference.     Microbiology: Recent Results (from the past 240 hour(s))  SARS Coronavirus 2 by RT PCR (hospital order, performed in [facility] hospital lab) Nasopharyngeal Nasopharyngeal Swab     Status: None   Collection Time: 02/18/20  2:30 AM   Specimen: Nasopharyngeal Swab  Result Value Ref Range Status   SARS  Coronavirus 2 NEGATIVE NEGATIVE Final    Comment: (NOTE) SARS-CoV-2 target nucleic acids are NOT DETECTED.  The SARS-CoV-2 RNA is generally detectable in upper and lower respiratory specimens during the acute phase of infection. The lowest concentration of SARS-CoV-2 viral copies this assay can detect is 250 copies / mL. A negative result does not preclude SARS-CoV-2 infection and should not be used as the sole basis for treatment or other patient management decisions.  A negative result may occur with improper specimen collection / handling, submission of specimen other than nasopharyngeal swab, presence of viral mutation(s) within the areas targeted by this assay, and inadequate number of viral copies (<250 copies / mL). A negative result must be combined with clinical observations, patient history, and epidemiological information.  Fact Sheet for Patients:   StrictlyIdeas.no  Fact Sheet for Healthcare Providers: BankingDealers.co.za  This test is not yet approved or  cleared by the [place] FDA and has been authorized for detection and/or diagnosis of SARS-CoV-2 by FDA under an Emergency Use Authorization (EUA).  This EUA will remain in effect (meaning this test can be used) for the  duration of the COVID-19 declaration under Section 564(b)(1) of the Act, 21 U.S.C. section 360bbb-3(b)(1), unless the authorization is terminated or revoked sooner.  Performed at [facility], [place] [address] [place], [place] [address]   Blood culture (routine x 2)     Status: None   Collection Time: 02/18/20  5:45 AM   Specimen: BLOOD LEFT HAND  Result Value Ref Range Status   Specimen Description BLOOD LEFT HAND  Final   Special Requests   Final    BOTTLES DRAWN AEROBIC AND ANAEROBIC Blood Culture results may not be optimal due to an inadequate volume of blood received in culture bottles   Culture   Final    NO GROWTH 5 DAYS Performed  at [facility], [place] [address], [place], [place] [address]    Report Status 02/23/2020 FINAL  Final  Blood culture (routine x 2)     Status: None   Collection Time: 02/18/20  5:50 AM   Specimen: BLOOD RIGHT HAND  Result Value Ref Range Status   Specimen Description BLOOD RIGHT HAND  Final   Special Requests   Final    BOTTLES DRAWN AEROBIC AND ANAEROBIC Blood Culture results may not be optimal due to an inadequate volume of blood received in culture bottles   Culture   Final    NO GROWTH 5 DAYS Performed at [facility], [place] [address], [place], [place] [address]    Report Status 02/23/2020 FINAL  Final  MRSA PCR Screening     Status: None   Collection Time: 02/18/20  1:52 PM   Specimen: Nasal Mucosa; Nasopharyngeal  Result Value Ref Range Status   MRSA by PCR NEGATIVE NEGATIVE Final    Comment:        The GeneXpert MRSA Assay (FDA approved for NASAL specimens only), is one component of a comprehensive MRSA colonization surveillance program. It is not intended to diagnose MRSA infection nor to guide or monitor treatment for MRSA infections. Performed at [facility], [place] [address], [place], [place] [address]      Labs: BNP (last 3 results) Recent Labs    08/06/19 1824 02/18/20 0024  BNP 682.6* 809.9*   Basic Metabolic Panel: Recent Labs  Lab 02/21/20 0335 02/22/20 0241 02/23/20 0330 02/23/20 0330 02/24/20 0301 02/25/20 0325 02/26/20 0405 02/27/20 0332 02/27/20 1120  NA 144   < > 140   < > 138 135 137 139 140  K 3.4*   < > 4.2   < > 4.6 5.3* 5.1 5.5* 5.5*  CL 97*   < > 96*  --  98 97* 97* 99  --   CO2 33*   < > 32  --  35* 28 32 30  --   GLUCOSE 85   < > 87  --  96 98 115* 86  --   BUN 20   < > 30*  --  28* 31* 33* 38*  --   CREATININE 1.20*   < > 1.34*  --  1.35* 1.48* 1.53* 1.39*  --   CALCIUM 8.9   < > 8.8*  --  8.8* 8.8* 9.3 9.8  --   MG 1.6*  --  2.0  --   --   --  2.3 2.3  --    < > = values in this interval not displayed.    Liver Function Tests: No results for input(s): AST, ALT, ALKPHOS, BILITOT, PROT, ALBUMIN in the last 168  hours. No results for input(s): LIPASE, AMYLASE in the last 168 hours. No results for input(s): AMMONIA in the last 168 hours. CBC: Recent Labs  Lab 02/21/20 0335 02/24/20 0301 02/27/20 1120  WBC 5.2 3.9*  --   NEUTROABS  --  2.0  --   HGB 14.6 13.7 16.0*  HCT 46.9* 47.6* 47.0*  MCV 87.0 92.4  --   PLT 136* 132*  --    Cardiac Enzymes: No results for input(s): CKTOTAL, CKMB, CKMBINDEX, TROPONINI in the last 168 hours. BNP: Invalid input(s): POCBNP CBG: Recent Labs  Lab 02/26/20 1516 02/26/20 1923 02/26/20 2339 02/27/20 0333 02/27/20 0735  GLUCAP 135* 106* 111* 86 84   D-Dimer No results for input(s): DDIMER in the last 72 hours. Hgb A1c No results for input(s): HGBA1C in the last 72 hours. Lipid Profile No results for input(s): CHOL, HDL, LDLCALC, TRIG, CHOLHDL, LDLDIRECT in the last 72 hours. Thyroid function studies No results for input(s): TSH, T4TOTAL, T3FREE, THYROIDAB in the last 72 hours.  Invalid input(s): FREET3 Anemia work up No results for input(s): VITAMINB12, FOLATE, FERRITIN, TIBC, IRON, RETICCTPCT in the last 72 hours. Urinalysis    Component Value Date/Time   COLORURINE YELLOW 09/30/2016 1410   APPEARANCEUR CLEAR 09/30/2016 1410   LABSPEC 1.015 09/30/2016 1410   PHURINE 5.5 09/30/2016 1410   GLUCOSEU NEGATIVE 09/30/2016 1410   HGBUR TRACE-INTACT (A) 09/30/2016 1410   BILIRUBINUR NEGATIVE 09/30/2016 1410   KETONESUR NEGATIVE 09/30/2016 1410   PROTEINUR 30 (A) 09/19/2016 1354   UROBILINOGEN 0.2 09/30/2016 1410   NITRITE NEGATIVE 09/30/2016 1410   LEUKOCYTESUR SMALL (A) 09/30/2016 1410   Sepsis Labs Invalid input(s): PROCALCITONIN,  WBC,  LACTICIDVEN Microbiology Recent Results (from the past 240 hour(s))  SARS Coronavirus 2 by RT PCR (hospital order, performed in [facility] hospital lab) Nasopharyngeal Nasopharyngeal Swab      Status: None   Collection Time: 02/18/20  2:30 AM   Specimen: Nasopharyngeal Swab  Result Value Ref Range Status   SARS Coronavirus 2 NEGATIVE NEGATIVE Final    Comment: (NOTE) SARS-CoV-2 target nucleic acids are NOT DETECTED.  The SARS-CoV-2 RNA is generally detectable in upper and lower respiratory specimens during the acute phase of infection. The lowest concentration of SARS-CoV-2 viral copies this assay can detect is 250 copies / mL. A negative result does not preclude SARS-CoV-2 infection and should not be used as the sole basis for treatment or other patient management decisions.  A negative result may occur with improper specimen collection / handling, submission of specimen other than nasopharyngeal swab, presence of viral mutation(s) within the areas targeted by this assay, and inadequate number of viral copies (<250 copies / mL). A negative result must be combined with clinical observations, patient history, and epidemiological information.  Fact Sheet for Patients:   StrictlyIdeas.no  Fact Sheet for Healthcare Providers: BankingDealers.co.za  This test is not yet approved or  cleared by the [place] FDA and has been authorized for detection and/or diagnosis of SARS-CoV-2 by FDA under an Emergency Use Authorization (EUA).  This EUA will remain in effect (meaning this test can be used) for the duration of the COVID-19 declaration under Section 564(b)(1) of the Act, 21 U.S.C. section 360bbb-3(b)(1), unless the authorization is terminated or revoked sooner.  Performed at [facility], [place] [address], [place], [place] [address]   Blood culture (routine x 2)     Status: None   Collection Time: 02/18/20  5:45 AM   Specimen: BLOOD LEFT  HAND  Result Value Ref Range Status   Specimen Description BLOOD LEFT HAND  Final   Special Requests   Final    BOTTLES DRAWN AEROBIC AND ANAEROBIC Blood Culture results may not be  optimal due to an inadequate volume of blood received in culture bottles   Culture   Final    NO GROWTH 5 DAYS Performed at [facility], [place] [address] [place], [place] [address]    Report Status 02/23/2020 FINAL  Final  Blood culture (routine x 2)     Status: None   Collection Time: 02/18/20  5:50 AM   Specimen: BLOOD RIGHT HAND  Result Value Ref Range Status   Specimen Description BLOOD RIGHT HAND  Final   Special Requests   Final    BOTTLES DRAWN AEROBIC AND ANAEROBIC Blood Culture results may not be optimal due to an inadequate volume of blood received in culture bottles   Culture   Final    NO GROWTH 5 DAYS Performed at [facility], [place] [address], [place], [place] [address]    Report Status 02/23/2020 FINAL  Final  MRSA PCR Screening     Status: None   Collection Time: 02/18/20  1:52 PM   Specimen: Nasal Mucosa; Nasopharyngeal  Result Value Ref Range Status   MRSA by PCR NEGATIVE NEGATIVE Final    Comment:        The GeneXpert MRSA Assay (FDA approved for NASAL specimens only), is one component of a comprehensive MRSA colonization surveillance program. It is not intended to diagnose MRSA infection nor to guide or monitor treatment for MRSA infections. Performed at [facility], [place] [address] [place], [place] [address]      Time coordinating discharge: 35 minutes      SIGNED:   Edwin Dada, MD  [facility] 02/27/2020, 8:39 PM

## 2020-02-27 NOTE — Progress Notes (Signed)
Orthopedic Tech Progress Note Patient Details:  [name] [name] [birth year] [id number]  Ortho Devices Type of Ortho Device: Haematologist Ortho Device/Splint Location: Lower left extremity Ortho Device/Splint Interventions: Ordered, Application   Post Interventions Patient Tolerated: Well Instructions Provided: Care of device, Poper ambulation with device, Adjustment of device   Madelyn Tlatelpa P [name] 02/27/2020, 10:18 AM

## 2020-02-27 NOTE — Progress Notes (Signed)
NAME:  [name] [name], MRN:  [id number], DOB:  03-15-1981, LOS: 9 ADMISSION DATE:  02/17/2020, CONSULTATION DATE:  02/18/20  REFERRING MD:  Dr Lorin Mercy, CHIEF COMPLAINT:  Dyspnea, cough, leg swelling   Brief History   39 yo female with OSA/OHS and chronic tracheostomy since 2017 presented to ER with dyspnea, cough and edema.  Found to have community acquired pneumonia and cellulitis.  Developed encephalopathy from worsening hypoxia/hypercapnia.  Transferred to ICU and placed on ventilator.  Past Medical History  OSA/OHS, Chronic hypoxic/hypercapnic respiratory failure, Tracheostomy status, Chronic diastolic CHF, CKD 4, DM type 2, HTN, Pulmonary hypertension, Lymphedema  Significant Hospital Events   6/28 Admit 6/29 had #6 ETT placed through trach stoma to assist with mechanical ventilation; ENT consulted to upsize trach 7/1: Starting diet.  Physically feeling better.  Starting to mobilize. Consults:  ENT  Procedures:  ETT through trach stoma 6/29 >> 6/29 #6 cuffed trach 6/29 >>   Significant Diagnostic Tests:   CT chest 6/28 >> bilateral basilar scattered infiltrates/atelectasis, cardiomegaly, evidence for secondary PAH  Doppler legs b/l 6/28 >> limited study, no obvious DVT  Micro Data:  SARS CoV2 6/28 >> negative MRSA PCR 6/28 >> negative Blood 6/28 >>  Sputum 6/28 >>   Antimicrobials:  Azithro 6/28 x1 Ceftriaxone 6/28 x 1 Cefepime 6/28 >>   Interim history/subjective:  Ready to go home  Objective   Blood pressure 101/70, pulse 87, temperature 97.8 F (36.6 C), temperature source Oral, resp. rate 20, height 5' 5" (1.651 m), weight (Abnormal) 175 kg, SpO2 95 %, unknown if currently breastfeeding.    FiO2 (%):  [35 %] 35 %   Intake/Output Summary (Last 24 hours) at 02/27/2020 1204 Last data filed at 02/26/2020 2300 Gross per 24 hour  Intake 210 ml  Output no documentation  Net 210 ml   Filed Weights   02/23/20 0400 02/25/20 0345 02/27/20 0500  Weight: (Abnormal) 174  kg (Abnormal) 174.2 kg (Abnormal) 175 kg    Examination: General 39 year old black female resting in the chair she is in no acute distress today HEENT normocephalic atraumatic size 6 cuffed tracheostomy in place.  Phonation quality poor but audible with this cuffed trach.  Pulmonary: Clear to auscultation without accessory use, currently 6 L via trach collar Cardiac: Regular rate and rhythm Abdomen: Soft nontender Extremities: Lower extremity edema Neuro: Awake oriented no focal deficits.  Resolved Hospital Problem list   Acute metabolic encephalopathy  Assessment & Plan:   Acute on chronic hypoxic/hypercapnic respiratory failure 2nd to community acquired pneumonia and acute pulmonary edema due to acute diastolic heart failure in setting of OSA/OHS with chronic tracheostomy. Sepsis 2nd to possible CAP and cellulitis with lymphedema. CKD stage 2 Thrombocytopenia in setting of sepsis. Depression.    discussion Vernel is better from a tracheostomy/respiratory standpoint.  She is ready to go home.  I do think most of her respiratory issues are from progression of her underlying hypoxic and hypercarbic respiratory failure.  Unfortunately the last year with Covid-19 she has been less active, stays at home, has not exercised or been social, there is been is clear decline associated with this with her.  Additionally she has really had trouble committing to CPAP, at one point we had been hopeful would be able to decannulate her.  She is definitely taken a step back overall in her general health given this is her second hospital admission this year.  At this point her arterial blood gas to suggest we could make a argument  for noninvasive nocturnal ventilation, however given how hesitant she is and how poorly she is tolerated CPAP I am not convinced she would tolerate trilogy or noninvasive ventilation even through the trach any better.  At this point I think the best option is to get her home, have  her follow-up with pulmonary as well as with me in the tracheostomy clinic closely.  I am leaving a size 6 and, but transitioning her to cuffless.  I think leaving the 6 is important in case we do end up needing to initiate nocturnal ventilation.  Her follow-up will be in the next 3 weeks with pulmonary, I have instructed Terianna that it is very important that she lets [place] know if she starts to feel like she is losing ground specifically more short of breath, increased activity intolerance, decreased energy.  I think these would be strong indicators that we should probably proceed with nocturnal ventilation in hopes of avoiding another hospitalization  Plan We have changed her to a size 6 cuffless tracheostomy, I will see her again in 4 weeks in regards to this.  We provided bedside teaching of tracheostomy care Continue supplemental oxygen 4 to 6 L 6 L at night. Providing HME device Continue routine tracheostomy care Will have follow-up with pulmonary, if she is hospitalized again in the near future, or develops progressive decline in the outpatient setting I think we will need to make a commitment to nocturnal ventilation  Erick Colace ACNP-BC [place] Pager # [phone] OR # [phone] if no answer

## 2020-02-27 NOTE — TOC Transition Note (Signed)
Transition of Care [facility]) - CM/SW Discharge Note   Patient Details  Name: [name] [name] MRN: [id number] Date of Birth: 17-Aug-1981  [facility] CM/SW Contact:  Ella Bodo, RN Phone Number: 02/27/2020, 1:28 PM   Clinical Narrative:  Pt is a 39 year old woman admitted on 02/18/20 with CAP and cellulitis. Developed AMS and acute repiratory failure requiring ventilation. PTA, pt independent and living at home with parents and 32 year old daughter.  Pt discharging home today to [place], [place] with family; she states she has home O2 through [facility].  Pt has had hx of [place] care with [facility], but [facility] agency unable to pick pt back up for services.  Referral made to [facility] in [place], per pt choice for HHRN/PT and OT. HHRN to assist with trach and wound care.  Pt lives and hour and 10 minutes away; [employer] [facility] provided extra portable tanks for ride home.  Pt provided with trach supplies, including trach cleaning kits and extra #4 trach.  [employer] [facility] to provide HME (Heat Moisture Exchanger) and will drop-ship this item to pt's home within 3-5 days.  [name] notified that pt will have delay for humidification, and states this is acceptable.        Final next level of care: [place] Barriers to Discharge: Barriers Resolved   Patient Goals and CMS Choice Patient states their goals for this hospitalization and ongoing recovery are:: to get better CMS Medicare.gov Compare Post Acute Care list provided to:: Patient Choice offered to / list presented to : Patient                      Discharge Plan and Services   Discharge Planning Services: CM Consult, DC out of service area Post Acute Care Choice: Home Health          DME Arranged: Other see comment, Trach supplies, Oxygen DME Agency: [employer] Date DME Agency Contacted: 02/27/20 Time DME Agency Contacted: [id number] Representative spoke with at DME Agency: [place]  Arranged: RN, PT, OT   Date [place]: 02/27/20 Time [place]: 1223    Social Determinants of Health (SDOH) Interventions     Readmission Risk Interventions Readmission Risk Prevention Plan 02/27/2020  Post Dischage Appt Complete  Medication Screening Complete  Transportation Screening Complete  Some recent data might be hidden   Reinaldo Raddle, RN, BSN  Trauma/Neuro ICU Case Manager [phone]

## 2020-02-27 NOTE — Telephone Encounter (Signed)
Hospital follow up appointment made per provider request. Left a message for patient who is hospitalized that appointment time will be on her discharge paperwork.

## 2020-03-01 DIAGNOSIS — E119 Type 2 diabetes mellitus without complications: Secondary | ICD-10-CM

## 2020-03-03 NOTE — Progress Notes (Signed)
Pt called and left a message about how to take out inner cannula of the # 6 [place]iley  Uncuffed trach.  Gave her verbal instructions over the phone.  She was also instructed to call back tomorrow if she is still having difficulty.

## 2020-03-10 ENCOUNTER — Other Ambulatory Visit: Payer: Self-pay

## 2020-03-10 ENCOUNTER — Telehealth (INDEPENDENT_AMBULATORY_CARE_PROVIDER_SITE_OTHER): Payer: Medicare Other | Admitting: Family

## 2020-03-10 VITALS — Ht 65.0 in | Wt 370.0 lb

## 2020-03-10 DIAGNOSIS — L03116 Cellulitis of left lower limb: Secondary | ICD-10-CM

## 2020-03-10 NOTE — Progress Notes (Signed)
Virtual Visit via Video   I connected with patient on 03/10/20 at 10:20 AM EDT by a video enabled telemedicine application and verified that I am speaking with the correct person using two identifiers.  Location patient: Home Location provider: [employer], Office Persons participating in the virtual visit: Patient, Provider, CMA   I discussed the limitations of evaluation and management by telemedicine and the availability of in person appointments. The patient expressed understanding and agreed to proceed.  Subjective:   HPI:  39 year old female is in requesting a video visit today as a follow-up of a cellulitis that she had on her left lower leg when she was in the hospital for respiratory failure. She reports using a cream on her leg x 2 weeks and her leg is much better. She no longer has any redness, swelling or pain. She just wants to be sure it is safe to stop using the medication.   ROS:   See pertinent positives and negatives per HPI.  Patient Active Problem List   Diagnosis Date Noted  . Acute on chronic respiratory failure with hypoxia ([place]) 02/18/2020  . Acute respiratory failure with hypoxia and hypercapnia ([place]) 02/18/2020  . Cellulitis 02/18/2020  . Acute pulmonary embolism ([place]) 08/07/2019  . Hypoxemia 08/07/2019  . Tracheostomy care ([place])   . (HFpEF) heart failure with preserved ejection fraction ([place])   . Hypertension   . Peripheral edema   . Tracheostomy dependence ([place])   . Purulent bronchitis ([place])   . CKD (chronic kidney disease) stage 4, GFR 15-29 ml/min (HCC) 10/25/2016  . Acute renal failure superimposed on stage 3 chronic kidney disease ([place]) 09/15/2016  . Pneumonitis 09/15/2016  . Abnormal urinalysis 09/15/2016  . Dysphagia   . Urinary retention   . Hypoalbuminemia due to protein-calorie malnutrition ([place])   . Debilitated 06/29/2016  . Demand ischemia ([place])   . Moderate single current episode of major depressive disorder ([place])     . Diabetes mellitus ([place])   . Tracheostomy status ([place])   . Chronic diastolic heart failure ([place])   . Obesity hypoventilation syndrome ([place])   . Chronic respiratory failure with hypoxia and hypercapnia (HCC)   . HCAP (healthcare-associated pneumonia)   . Sinus pause 06/07/2016  . Difficult intravenous access   . Hepatic congestion 05/26/2016  . Sinus tachycardia 05/26/2016  . Pericardial effusion 05/26/2016  . Pulmonary hypertension ([place])   . Renal insufficiency 05/25/2016  . Abnormal EKG 05/25/2016  . Abdominal pain 05/25/2016  . Hypertensive heart disease with CHF (congestive heart failure) ([place]) 05/25/2016  . Oligohydramnios, delivered 05/27/2013  . NSVD (normal spontaneous vaginal delivery) 05/27/2013  . OSA (obstructive sleep apnea) 07/09/2011  . Morbid obesity ([place]) 05/18/2011    Social History   Tobacco Use  . Smoking status: Never Smoker  . Smokeless tobacco: Never Used  Substance Use Topics  . Alcohol use: No    Current Outpatient Medications:  .  amLODipine (NORVASC) 5 MG tablet, Take 1 tablet (5 mg total) by mouth daily., Disp: 30 tablet, Rfl: 3 .  carvedilol (COREG) 25 MG tablet, Take 1 tablet (25 mg total) by mouth 2 (two) times daily., Disp: 180 tablet, Rfl: 3 .  cetirizine (ZYRTEC) 10 MG tablet, Take 1 tablet (10 mg total) by mouth at bedtime., Disp: 90 tablet, Rfl: 1 .  citalopram (CELEXA) 10 MG tablet, Take 1 tablet (10 mg total) by mouth daily., Disp: 30 tablet, Rfl: 3 .  fluticasone (FLONASE) 50 MCG/ACT nasal spray, Place  2 sprays into both nostrils 2 (two) times daily., Disp: 16 g, Rfl: 6 .  furosemide (LASIX) 40 MG tablet, TAKE 1 TABLET BY MOUTH EVERY DAY (Patient taking differently: Take 40 mg by mouth daily. ), Disp: 90 tablet, Rfl: 3 .  hydrALAZINE (APRESOLINE) 100 MG tablet, TAKE 1 TABLET BY MOUTH THREE TIMES A DAY (Patient taking differently: Take 100 mg by mouth 3 (three) times daily. ), Disp: 270 tablet, Rfl: 3 .  isosorbide dinitrate (ISORDIL) 30  MG tablet, TAKE 1 TABLET BY MOUTH THREE TIMES A DAY (Patient taking differently: Take 30 mg by mouth 3 (three) times daily. ), Disp: 270 tablet, Rfl: 3  Allergies  Allergen Reactions  . No Known Allergies     Objective:   Ht 5' 5" (1.651 m)   Wt (!) 370 lb (167.8 kg) Comment: pt reported  BMI 61.57 kg/m   Patient is well-developed, well-nourished in no acute distress.  Resting comfortably at home.  Head is normocephalic, atraumatic.  No labored breathing.  Speech is clear and coherent with logical content.  Patient is alert and oriented at baseline.  Patient left lower extremity is hyperpigmented, similar to the right. No redness, open sores, or excoriation.   Assessment and Plan:    [name] [name] was seen today for acute visit.  Diagnoses and all orders for this visit:  Cellulitis of left leg   Advised patient to discontinue leg wraps and cream. Call the office if she develops any redness, swelling, pain, or oozing. Recheck for hospital follow-up as scheduled and sooner as needed  Kennyth Arnold, [facility] 03/10/2020

## 2020-03-11 DIAGNOSIS — Z9981 Dependence on supplemental oxygen: Secondary | ICD-10-CM | POA: Insufficient documentation

## 2020-03-12 ENCOUNTER — Other Ambulatory Visit: Payer: Self-pay

## 2020-03-13 ENCOUNTER — Ambulatory Visit (INDEPENDENT_AMBULATORY_CARE_PROVIDER_SITE_OTHER): Payer: Medicare Other

## 2020-03-13 ENCOUNTER — Ambulatory Visit (INDEPENDENT_AMBULATORY_CARE_PROVIDER_SITE_OTHER): Payer: Medicare Other | Admitting: Nurse Practitioner

## 2020-03-13 ENCOUNTER — Encounter: Payer: Self-pay | Admitting: Nurse Practitioner

## 2020-03-13 VITALS — BP 128/84 | HR 78 | Temp 97.5°F | Ht 65.0 in | Wt 380.4 lb

## 2020-03-13 DIAGNOSIS — L03116 Cellulitis of left lower limb: Secondary | ICD-10-CM | POA: Diagnosis not present

## 2020-03-13 DIAGNOSIS — N184 Chronic kidney disease, stage 4 (severe): Secondary | ICD-10-CM

## 2020-03-13 DIAGNOSIS — Z1231 Encounter for screening mammogram for malignant neoplasm of breast: Secondary | ICD-10-CM

## 2020-03-13 DIAGNOSIS — Z0000 Encounter for general adult medical examination without abnormal findings: Secondary | ICD-10-CM | POA: Diagnosis not present

## 2020-03-13 DIAGNOSIS — I872 Venous insufficiency (chronic) (peripheral): Secondary | ICD-10-CM

## 2020-03-13 LAB — CBC WITH DIFFERENTIAL/PLATELET
Basophils Absolute: 0 10*3/uL (ref 0.0–0.1)
Basophils Relative: 0.5 % (ref 0.0–3.0)
Eosinophils Absolute: 0.4 10*3/uL (ref 0.0–0.7)
Eosinophils Relative: 8.8 % — ABNORMAL HIGH (ref 0.0–5.0)
HCT: 42.5 % (ref 36.0–46.0)
Hemoglobin: 13.1 g/dL (ref 12.0–15.0)
Lymphocytes Relative: 13.3 % (ref 12.0–46.0)
Lymphs Abs: 0.7 10*3/uL (ref 0.7–4.0)
MCHC: 30.8 g/dL (ref 30.0–36.0)
MCV: 86.7 fl (ref 78.0–100.0)
Monocytes Absolute: 0.7 10*3/uL (ref 0.1–1.0)
Monocytes Relative: 14.1 % — ABNORMAL HIGH (ref 3.0–12.0)
Neutro Abs: 3.2 10*3/uL (ref 1.4–7.7)
Neutrophils Relative %: 63.3 % (ref 43.0–77.0)
Platelets: 138 10*3/uL — ABNORMAL LOW (ref 150.0–400.0)
RBC: 4.9 Mil/uL (ref 3.87–5.11)
RDW: 19.5 % — ABNORMAL HIGH (ref 11.5–15.5)
WBC: 5.1 10*3/uL (ref 4.0–10.5)

## 2020-03-13 LAB — BASIC METABOLIC PANEL
BUN: 22 mg/dL (ref 6–23)
CO2: 36 mEq/L — ABNORMAL HIGH (ref 19–32)
Calcium: 9.6 mg/dL (ref 8.4–10.5)
Chloride: 96 mEq/L (ref 96–112)
Creatinine, Ser: 1.08 mg/dL (ref 0.40–1.20)
GFR: 67.77 mL/min (ref >60.00)
Glucose, Bld: 107 mg/dL — ABNORMAL HIGH (ref 70–99)
Potassium: 4.2 mEq/L (ref 3.5–5.1)
Sodium: 136 mEq/L (ref 135–145)

## 2020-03-13 MED ORDER — MUPIROCIN 2 % EX OINT: 1.0000 "application " | TOPICAL_OINTMENT | Freq: Two times a day (BID) | CUTANEOUS | 0 refills | Status: DC

## 2020-03-13 NOTE — Patient Instructions (Addendum)
Go to lab for blood draw  Maintain upcoming appt with pulmonology.  Use bactroban ointment x 7days then stop Leave skin open to air as much as possible Moisturize other parts of skin with eucerin cream combined with pertroleum gel BID. Clean skin with mild unscented soap.  Call office if no improvement in 1week  Stasis Dermatitis Stasis dermatitis is a long-term (chronic) skin condition that happens when veins can no longer pump blood back to the heart (poor circulation). This condition causes a red or brown scaly rash or sores (ulcers) from the pooling of blood (stasis). This condition usually affects the lower legs. It may affect one leg or both legs. Without treatment, severe stasis dermatitis can lead to other skin conditions and infections. What are the causes? This condition is caused by poor circulation. What increases the risk? You are more likely to develop this condition if:  You are not very active.  You stand for long periods of time.  You have veins that have become enlarged and twisted (varicose veins).  You have leg veins that are not strong enough to send blood back to the heart (venous insufficiency).  You have had a blood clot.  You have been pregnant many times.  You have had vein surgery.  You are obese.  You have heart or kidney failure.  You are 70 years of age or older.  You have had injuries to your legs in the past. What are the signs or symptoms? Common early symptoms of this condition include:  Itchiness in one or both of your legs.  Swelling in your ankle or leg. This might get better overnight but be worse again during the day.  Skin that looks thin on your ankle and leg.  Red or brown marks that develop slowly.  Skin that is dry, cracked, or easily irritated.  Red, swollen skin that is sore or has a burning feeling.  An achy or heavy feeling after you walk or stand for long periods of time.  Pain. Later and more severe symptoms  of this condition include:  Skin that looks shiny.  Small, open sores (ulcers). These are often red or purple and leak fluid.  Skin that feels hard.  Severe itching.  A change in the shape or color of your lower legs.  Severe pain.  Difficulty walking. How is this diagnosed? This condition may be diagnosed based on:  Your symptoms and medical history.  A physical exam. You may also have tests, including:  Blood tests.  Imaging tests to check blood flow (Doppler ultrasound).  Allergy tests. You may need to see a health care provider who specializes in skin diseases (dermatologist). How is this treated? This condition may be treated with:  Compression stockings or an elastic wrap to improve circulation.  Medicines, such as: ? Corticosteroid creams and ointments. ? Non-corticosteroid medicines applied to the skin (topical). ? Medicine to reduce swelling in the legs (diuretics). ? Antibiotics. ? Medicine to relieve itching (antihistamines).  A bandage (dressing).  A wrap that contains zinc and gelatin (Unna boot). Follow these instructions at home: Skin care  Moisturize your skin as told by your health care provider. Do not use moisturizers with fragrance. This can irritate your skin.  Apply a cool, wet cloth (cool compress) to the affected areas.  Do not scratch your skin.  Do not rub your skin dry after a bath or shower. Gently pat your skin dry.  Do not use scented soaps, detergents, or perfumes. Medicines  Take or use over-the-counter and prescription medicines only as told by your health care provider.  If you were prescribed an antibiotic medicine, take or use it as told by your health care provider. Do not stop taking or using the antibiotic even if your condition improves. Activity  Walk as told by your health care provider. Walking increases blood flow.  Do calf and ankle exercises throughout the day as told by your health care provider. This will  help increase blood flow.  Raise (elevate) your legs above the level of your heart when you are sitting or lying down. Lifestyle  Work with your health care provider to lose weight, if needed.  Do not cross your legs when you sit.  Do not stand or sit in one position for long periods of time.  Wear comfortable, loose-fitting clothing. Circulation in your legs will be worse if you wear tight pants, belts, and waistbands.  Do not use any products that contain nicotine or tobacco, such as cigarettes, e-cigarettes, and chewing tobacco. If you need help quitting, ask your health care provider. General instructions  If you were asked to use one of the following to help with your condition, follow instructions from your health care provider on how to: ? Remove and change any dressing. ? Wear compression stockings. These stockings help to prevent blood clots and reduce swelling in your legs. ? Wear the [employer].  Keep all follow-up visits as told by your health care provider. This is important. Contact a health care provider if:  Your condition does not improve with treatment.  Your condition gets worse.  You have signs of infection in the affected area. Watch for: ? Swelling. ? Tenderness. ? Redness. ? Soreness. ? Warmth.  You have a fever. Get help right away if:  You notice red streaks coming from the affected area.  Your bone or joint underneath the affected area becomes painful after the skin has healed.  The affected area turns darker.  You feel a deep pain in your leg or groin.  You are short of breath. Summary  Stasis dermatitis is a long-term (chronic) skin condition that happens when veins can no longer pump blood back to the heart (poor circulation).  Wear compression stockings as told by your health care provider. These stockings help to prevent blood clots and reduce swelling in your legs.  Follow instructions from your health care provider about activity,  medicines, and lifestyle.  Contact a health care provider if you have a fever or have signs of infection in the affected area.  Keep all follow-up visits as told by your health care provider. This is important. This information is not intended to replace advice given to you by your health care provider. Make sure you discuss any questions you have with your health care provider. Document Revised: 01/09/2018 Document Reviewed: 01/09/2018 Elsevier Patient Education  [place].

## 2020-03-13 NOTE — Patient Instructions (Signed)
Ms. [name] [name] , Thank you for taking time to complete your Medicare Wellness Visit. I appreciate your ongoing commitment to your health goals. Please review the following plan we discussed and let me know if I can assist you in the future.   Screening recommendations/referrals: Mammogram: Ordered today. Someone will be calling you to schedule Recommended yearly ophthalmology/optometry visit for glaucoma screening and checkup Recommended yearly dental visit for hygiene and checkup  Vaccinations: Influenza vaccine: Due 04/2020 Pneumococcal vaccine: Discuss with PCP Tdap vaccine: Discuss with pharmacy Shingles vaccine: Discuss with pharmacy Covid-19: Completed vaccines  Advanced directives: Discussed. Information mailed today.  Conditions/risks identified: See problem list  Next appointment: Follow up in one year for your annual wellness visit.   Preventive Care 40-64 Years, Female Preventive care refers to lifestyle choices and visits with your health care provider that can promote health and wellness. What does preventive care include?  A yearly physical exam. This is also called an annual well check.  Dental exams once or twice a year.  Routine eye exams. Ask your health care provider how often you should have your eyes checked.  Personal lifestyle choices, including:  Daily care of your teeth and gums.  Regular physical activity.  Eating a healthy diet.  Avoiding tobacco and drug use.  Limiting alcohol use.  Practicing safe sex.  Taking low-dose aspirin daily starting at age 70.  Taking vitamin and mineral supplements as recommended by your health care provider. What happens during an annual well check? The services and screenings done by your health care provider during your annual well check will depend on your age, overall health, lifestyle risk factors, and family history of disease. Counseling  Your health care provider may ask you questions about your:  Alcohol  use.  Tobacco use.  Drug use.  Emotional well-being.  Home and relationship well-being.  Sexual activity.  Eating habits.  Work and work Statistician.  Method of birth control.  Menstrual cycle.  Pregnancy history. Screening  You may have the following tests or measurements:  Height, weight, and BMI.  Blood pressure.  Lipid and cholesterol levels. These may be checked every 5 years, or more frequently if you are over 99 years old.  Skin check.  Lung cancer screening. You may have this screening every year starting at age 20 if you have a 30-pack-year history of smoking and currently smoke or have quit within the past 15 years.  Fecal occult blood test (FOBT) of the stool. You may have this test every year starting at age 85.  Flexible sigmoidoscopy or colonoscopy. You may have a sigmoidoscopy every 5 years or a colonoscopy every 10 years starting at age 67.  Hepatitis C blood test.  Hepatitis B blood test.  Sexually transmitted disease (STD) testing.  Diabetes screening. This is done by checking your blood sugar (glucose) after you have not eaten for a while (fasting). You may have this done every 1-3 years.  Mammogram. This may be done every 1-2 years. Talk to your health care provider about when you should start having regular mammograms. This may depend on whether you have a family history of breast cancer.  BRCA-related cancer screening. This may be done if you have a family history of breast, ovarian, tubal, or peritoneal cancers.  Pelvic exam and Pap test. This may be done every 3 years starting at age 2. Starting at age 29, this may be done every 5 years if you have a Pap test in combination with an HPV  test.  Bone density scan. This is done to screen for osteoporosis. You may have this scan if you are at high risk for osteoporosis. Discuss your test results, treatment options, and if necessary, the need for more tests with your health care  provider. Vaccines  Your health care provider may recommend certain vaccines, such as:  Influenza vaccine. This is recommended every year.  Tetanus, diphtheria, and acellular pertussis (Tdap, Td) vaccine. You may need a Td booster every 10 years.  Zoster vaccine. You may need this after age 85.  Pneumococcal 13-valent conjugate (PCV13) vaccine. You may need this if you have certain conditions and were not previously vaccinated.  Pneumococcal polysaccharide (PPSV23) vaccine. You may need one or two doses if you smoke cigarettes or if you have certain conditions. Talk to your health care provider about which screenings and vaccines you need and how often you need them. This information is not intended to replace advice given to you by your health care provider. Make sure you discuss any questions you have with your health care provider. Document Released: 09/05/2015 Document Revised: 04/28/2016 Document Reviewed: 06/10/2015 Elsevier Interactive Patient Education  2017 [employer] Prevention in the Home Falls can cause injuries. They can happen to people of all ages. There are many things you can do to make your home safe and to help prevent falls. What can I do on the outside of my home?  Regularly fix the edges of walkways and driveways and fix any cracks.  Remove anything that might make you trip as you walk through a door, such as a raised step or threshold.  Trim any bushes or trees on the path to your home.  Use bright outdoor lighting.  Clear any walking paths of anything that might make someone trip, such as rocks or tools.  Regularly check to see if handrails are loose or broken. Make sure that both sides of any steps have handrails.  Any raised decks and porches should have guardrails on the edges.  Have any leaves, snow, or ice cleared regularly.  Use sand or salt on walking paths during winter.  Clean up any spills in your garage right away. This includes  oil or grease spills. What can I do in the bathroom?  Use night lights.  Install grab bars by the toilet and in the tub and shower. Do not use towel bars as grab bars.  Use non-skid mats or decals in the tub or shower.  If you need to sit down in the shower, use a plastic, non-slip stool.  Keep the floor dry. Clean up any water that spills on the floor as soon as it happens.  Remove soap buildup in the tub or shower regularly.  Attach bath mats securely with double-sided non-slip rug tape.  Do not have throw rugs and other things on the floor that can make you trip. What can I do in the bedroom?  Use night lights.  Make sure that you have a light by your bed that is easy to reach.  Do not use any sheets or blankets that are too big for your bed. They should not hang down onto the floor.  Have a firm chair that has side arms. You can use this for support while you get dressed.  Do not have throw rugs and other things on the floor that can make you trip. What can I do in the kitchen?  Clean up any spills right away.  Avoid  walking on wet floors.  Keep items that you use a lot in easy-to-reach places.  If you need to reach something above you, use a strong step stool that has a grab bar.  Keep electrical cords out of the way.  Do not use floor polish or wax that makes floors slippery. If you must use wax, use non-skid floor wax.  Do not have throw rugs and other things on the floor that can make you trip. What can I do with my stairs?  Do not leave any items on the stairs.  Make sure that there are handrails on both sides of the stairs and use them. Fix handrails that are broken or loose. Make sure that handrails are as long as the stairways.  Check any carpeting to make sure that it is firmly attached to the stairs. Fix any carpet that is loose or worn.  Avoid having throw rugs at the top or bottom of the stairs. If you do have throw rugs, attach them to the floor  with carpet tape.  Make sure that you have a light switch at the top of the stairs and the bottom of the stairs. If you do not have them, ask someone to add them for you. What else can I do to help prevent falls?  Wear shoes that:  Do not have high heels.  Have rubber bottoms.  Are comfortable and fit you well.  Are closed at the toe. Do not wear sandals.  If you use a stepladder:  Make sure that it is fully opened. Do not climb a closed stepladder.  Make sure that both sides of the stepladder are locked into place.  Ask someone to hold it for you, if possible.  Clearly mark and make sure that you can see:  Any grab bars or handrails.  First and last steps.  Where the edge of each step is.  Use tools that help you move around (mobility aids) if they are needed. These include:  Canes.  Walkers.  Scooters.  Crutches.  Turn on the lights when you go into a dark area. Replace any light bulbs as soon as they burn out.  Set up your furniture so you have a clear path. Avoid moving your furniture around.  If any of your floors are uneven, fix them.  If there are any pets around you, be aware of where they are.  Review your medicines with your doctor. Some medicines can make you feel dizzy. This can increase your chance of falling. Ask your doctor what other things that you can do to help prevent falls. This information is not intended to replace advice given to you by your health care provider. Make sure you discuss any questions you have with your health care provider. Document Released: 06/05/2009 Document Revised: 01/15/2016 Document Reviewed: 09/13/2014 Elsevier Interactive Patient Education  2017 [employer].

## 2020-03-13 NOTE — Progress Notes (Signed)
Subjective:   [name] [name] is a 39 y.o. female who presents for an Initial Medicare Annual Wellness Visit.  I connected with Talasia today by telephone and verified that I am speaking with the correct person using two identifiers. Location patient: home Location provider: work Persons participating in the virtual visit: patient, Marine scientist.    I discussed the limitations, risks, security and privacy concerns of performing an evaluation and management service by telephone and the availability of in person appointments. I also discussed with the patient that there may be a patient responsible charge related to this service. The patient expressed understanding and verbally consented to this telephonic visit.    Interactive audio and video telecommunications were attempted between this provider and patient, however failed, due to patient having technical difficulties OR patient did not have access to video capability.  We continued and completed visit with audio only.  Some vital signs may be absent or patient reported.   Time Spent with patient on telephone encounter: 20 minutes  Review of Systems      Cardiac Risk Factors include: diabetes mellitus;sedentary lifestyle;obesity (BMI >30kg/m2)     Objective:    There were no vitals filed for this visit. There is no height or weight on file to calculate BMI.  Advanced Directives 03/13/2020 02/18/2020 11/16/2016 09/24/2016 09/15/2016 09/15/2016 08/18/2016  Does Patient Have a Medical Advance Directive? _0  No No  Would patient like information on creating a medical advance directive? Yes (MAU/Ambulatory/Procedural Areas - Information given) No - Patient declined No - Patient declined No - Patient declined No - Patient declined - -  Pre-existing out of facility DNR order (yellow form or pink MOST form) - - - - - - -    Current Medications (verified) Outpatient Encounter Medications as of 03/13/2020  Medication Sig  . amLODipine  (NORVASC) 5 MG tablet Take 1 tablet (5 mg total) by mouth daily.  . carvedilol (COREG) 25 MG tablet Take 1 tablet (25 mg total) by mouth 2 (two) times daily.  . cetirizine (ZYRTEC) 10 MG tablet Take 1 tablet (10 mg total) by mouth at bedtime.  . citalopram (CELEXA) 10 MG tablet Take 1 tablet (10 mg total) by mouth daily.  . fluticasone (FLONASE) 50 MCG/ACT nasal spray Place 2 sprays into both nostrils 2 (two) times daily.  . furosemide (LASIX) 40 MG tablet TAKE 1 TABLET BY MOUTH EVERY DAY (Patient taking differently: Take 40 mg by mouth daily. )  . hydrALAZINE (APRESOLINE) 100 MG tablet TAKE 1 TABLET BY MOUTH THREE TIMES A DAY (Patient taking differently: Take 100 mg by mouth 3 (three) times daily. )  . isosorbide dinitrate (ISORDIL) 30 MG tablet TAKE 1 TABLET BY MOUTH THREE TIMES A DAY (Patient taking differently: Take 30 mg by mouth 3 (three) times daily. )  . mupirocin ointment (BACTROBAN) 2 % Apply 1 application topically 2 (two) times daily.   No facility-administered encounter medications on file as of 03/13/2020.    Allergies (verified) No known allergies   History: Past Medical History:  Diagnosis Date  . Chronic diastolic heart failure ([place])    Echo 10/12: EF 50-55 // b. Echo 10/17: EF 50-55, mild BAE, PASP 42 // Echo 10/18: Mild concentric LVH, EF 55-60, normal wall motion, mild MAC, severe LAE, mild TR, PASP 15, trivial pericardial effusion  . CKD (chronic kidney disease) stage 4, GFR 15-29 ml/min ([place]) 10/25/2016   admitted in 2/18 with SCr of 10 in the setting of  dehydration from illness, etc >> improved to 5 at DC  . DM2 (diabetes mellitus, type 2) ([facility])    A1c 7.2 in 05/2016  . Gestational diabetes mellitus in pregnancy 05/24/2013  . HTN in pregnancy, chronic 05/24/2013  . Hypertensive heart disease with CHF (congestive heart failure) ([place]) 05/25/2016  . Morbid obesity ([place])   . OSA (obstructive sleep apnea) 07/09/2011   s/p Trach 2/2 prolonged respiratory failure during admx  for CHF in 05/2016  . Pulmonary hypertension ([place])    secondary from CHF/OSA  . Sinus tachycardia    Past Surgical History:  Procedure Laterality Date  . TRACHEOSTOMY TUBE PLACEMENT N/A 06/11/2016   Procedure: TRACHEOSTOMY;  Surgeon: Melida Quitter, MD;  Location: [facility] OR;  Service: ENT;  Laterality: N/A;   Family History  Problem Relation Age of Onset  . Hypertension Mother   . Diabetes Other   . Birth defects Paternal Grandfather        unknown type   Social History   Socioeconomic History  . Marital status: Single    Spouse name: Not on file  . Number of children: Not on file  . Years of education: Not on file  . Highest education level: Not on file  Occupational History  . Occupation: Chief Technology Officer: [employer]  Tobacco Use  . Smoking status: Never Smoker  . Smokeless tobacco: Never Used  Vaping Use  . Vaping Use: Never used  Substance and Sexual Activity  . Alcohol use: No  . Drug use: No  . Sexual activity: Not Currently    Birth control/protection: I.U.D.  Other Topics Concern  . 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 Difficulty of Paying Living Expenses: Not hard at all  Food Insecurity: No Food Insecurity  . Worried About Charity fundraiser in the Last Year: Never true  . Ran Out of Food in the Last Year: Never true  Transportation Needs: No Transportation Needs  . Lack of Transportation (Medical): No  . Lack of Transportation (Non-Medical): No  Physical Activity: Inactive  . Days of Exercise per Week: 0 days  . Minutes of Exercise per Session: 0 min  Stress: No Stress Concern Present  . Feeling of Stress : Not at all  Social Connections: Moderately Integrated  . Frequency of Communication with Friends and Family: More than three times a week  . Frequency of Social  Gatherings with Friends and Family: More than three times a week  . Attends Religious Services: More than 4 times per year  . Active Member of Clubs or Organizations: Yes  . Attends Archivist Meetings: More than 4 times per year  . Marital Status: Never married    Tobacco Counseling Counseling given: Not Answered   Clinical Intake:  Pre-visit preparation completed: Yes  Pain : No/denies pain     Nutritional Status: BMI > 30  Obese Nutritional Risks: None Diabetes: Yes CBG done?: No Did pt. bring in CBG monitor from home?: No  How often do you need to have someone help you when you read instructions, pamphlets, or other written materials from your doctor or pharmacy?: 1 - Never What is the last grade level you completed in school?: 2 yrs of college  Nutrition  Risk Assessment:  Has the patient had any N/V/D within the last 2 months?  No  Does the patient have any non-healing wounds?  No  Has the patient had any unintentional weight loss or weight gain?  No   Diabetes:  Is the patient diabetic?  Yes  If diabetic, was a CBG obtained today?  No  Did the patient bring in their glucometer from home?  No  How often do you monitor your CBG's? never.   Financial Strains and Diabetes Management:  Are you having any financial strains with the device, your supplies or your medication? No .  Does the patient want to be seen by Chronic Care Management for management of their diabetes?  No  Would the patient like to be referred to a Nutritionist or for Diabetic Management?  No   Diabetic Exams:  Diabetic Eye Exam: Overdue for diabetic eye exam. Pt has been advised about the importance in completing this exam.Patient plans to make an appt.  Diabetic Foot Exam: Completed 11/20/2019. Interpreter Needed?: No  Information entered by :: Caroleen Hamman LPN   Activities of Daily Living In your present state of health, do you have any difficulty performing the following  activities: 03/13/2020 02/18/2020  Hearing? N N  Vision? N N  Difficulty concentrating or making decisions? N N  Walking or climbing stairs? N Y  Dressing or bathing? N Y  Doing errands, shopping? N -  Preparing Food and eating ? N -  Using the Toilet? N -  In the past six months, have you accidently leaked urine? N -  Do you have problems with loss of bowel control? N -  Managing your Medications? N -  Managing your Finances? N -  Housekeeping or managing your Housekeeping? N -  Some recent data might be hidden     Immunizations and Health Maintenance Immunization History  Administered Date(s) Administered  . Influenza,inj,Quad PF,6+ Mos 06/30/2016  . PFIZER SARS-COV-2 Vaccination 11/28/2019, 12/19/2019   Health Maintenance Due  Topic Date Due  . Hepatitis C Screening  Never done  . PNEUMOCOCCAL POLYSACCHARIDE VACCINE AGE 73-64 HIGH RISK  Never done  . TETANUS/TDAP  Never done  . PAP SMEAR-Modifier  Never done  . OPHTHALMOLOGY EXAM  10/28/2017    Patient Care Team: Nche, Charlene Brooke, NP as PCP - General (Internal Medicine)  Indicate any recent Medical Services you may have received from other than [facility] providers in the past year (date may be approximate).     Assessment:   This is a routine wellness examination for Mariaha.  Hearing/Vision screen  Hearing Screening   _0  _1  _2  _3  _4  _5  _6  _7  _8   Right ear:           Left ear:           Comments: No issues  Vision Screening Comments: Wears glasses   Dietary issues and exercise activities discussed: Current Exercise Habits: The patient does not participate in regular exercise at present, Exercise limited by: respiratory conditions(s)  Goals Addressed            This Visit's Progress   . Patient Stated       Eating a healthier diet & lose weight      Depression Screen PHQ 2/9 Scores 03/13/2020 03/13/2020 03/13/2020 08/21/2019 09/24/2016 07/12/2016  PHQ - 2 Score 0 3 0 0 0 0    PHQ- 9 Score - 3 - - - 0    Fall Risk Fall Risk  03/13/2020 03/13/2020 08/21/2019 08/18/2016 08/18/2016  Falls in the past year? 0 0 0 No No  Number falls in past yr: 0 0 - - -  Injury with Fall? 0 0 - - -  Follow up Falls prevention discussed - - - -    FALL RISK PREVENTION PERTAINING TO THE HOME:  Any stairs in or around the home? No  If so, are there any without handrails? No   Home free of loose throw rugs in walkways, pet beds, electrical cords, etc? yes Adequate lighting in your home to reduce risk of falls? yes  ASSISTIVE DEVICES UTILIZED TO PREVENT FALLS:  Life alert? No  Use of a cane, walker or w/c? No  Grab bars in the bathroom? No  Shower chair or bench in shower? No  Elevated toilet seat or a handicapped toilet? No    TIMED UP AND GO:  Was the test performed? No . Virtual visit    Cognitive Function:No cognitive impairment noted        Screening Tests Health Maintenance  Topic Date Due  . Hepatitis C Screening  Never done  . PNEUMOCOCCAL POLYSACCHARIDE VACCINE AGE 36-64 HIGH RISK  Never done  . TETANUS/TDAP  Never done  . PAP SMEAR-Modifier  Never done  . OPHTHALMOLOGY EXAM  10/28/2017  . INFLUENZA VACCINE  03/23/2020  . HEMOGLOBIN A1C  05/22/2020  . FOOT EXAM  11/19/2020  . COVID-19 Vaccine  Completed  . HIV Screening  Completed    Qualifies for Shingles Vaccine? No   Tdap: Although this vaccine is not a covered service during a Wellness Exam, does the patient still wish to receive this vaccine today?  No .  Education has been provided regarding the importance of this vaccine. Advised may receive this vaccine at local pharmacy or Health Dept. Aware to provide a copy of the vaccination record if obtained from local pharmacy or Health Dept. Verbalized acceptance and understanding.  Flu Vaccine: Due 04/2020  Pneumococcal Vaccine: Discuss with PCP  COVID-19 vaccine: Vaccines received  Cancer Screenings:  Colorectal Screening: Not indicated  until age 48  Mammogram: Not indicated until age 9  Bone Density: Not indicated until age 3  Lung Cancer Screening: (Low Dose CT Chest recommended if Age 49-80 years, 30 pack-year currently smoking OR have quit w/in 15years.) does not qualify.    Additional Screening:  Hepatitis C Screening:  Discuss with PCP   Dental Screening: Recommended annual dental exams for proper oral hygiene  Community Resource Referral:  CRR required this visit?  No       Plan:  I have personally reviewed and addressed the Medicare Annual Wellness questionnaire and have noted the following in the patient's chart:  A. Medical and social history B. Use of alcohol, tobacco or illicit drugs  C. Current medications and supplements D. Functional ability and status E.  Nutritional status F.  Physical activity G. Advance directives H. List of other physicians I.  Hospitalizations, surgeries, and ER visits in previous 12 months J.  [place] such as hearing and vision if needed, cognitive and depression L. Referrals and appointments   In addition, I have reviewed and discussed with patient certain preventive protocols, quality metrics, and best practice recommendations. A written personalized care plan for preventive services as well as general preventive health recommendations were provided to patient.  Due to this being a telephonic visit, the after visit summary with patients personalized plan was offered to patient via mail or my-chart.  Patient  would like to access on my-chart  Signed,    Marta Antu, LPN   2/48/1859  Nurse Health Advisor    Nurse Notes: None

## 2020-03-13 NOTE — Progress Notes (Signed)
Subjective:  Patient ID: [name] [name], female    DOB: Jun 15, 1981  Age: 39 y.o. MRN: [id number]  CC: Follow-up (hypoxia-pt states her breathing is still no the best-oxygen dependant//no pnuemonia//np tetanus)  HPI  Venous stasis dermatitis of left lower extremity Chronic, waxing and waning, new onset of redness on medial aspect since recent hospitalization. Denies any fever or drainage or pain. Hyperpigmented, thick scaly skin below knee (lateral aspect of lower extremity), no ulcers  Start bactroban ointment x 1week Advise about need for adequate moisturizing and adequate skin hygiene. She is to call office if no improvement in 1week  CKD (chronic kidney disease) stage 4, GFR 15-29 ml/min (HCC) improved renal function. BMP Latest Ref Rng & Units 03/13/2020 02/27/2020 02/27/2020  Glucose 70 - 99 mg/dL 107(H) - 86  BUN 6 - 23 mg/dL 22 - 38(H)  Creatinine 0.40 - 1.20 mg/dL 1.08 - 1.39(H)  BUN/Creat Ratio 9 - 23 - - -  Sodium 135 - 145 mEq/L 136 140 139  Potassium 3.5 - 5.1 mEq/L 4.2 5.5(H) 5.5(H)  Chloride 96 - 112 mEq/L 96 - 99  CO2 19 - 32 mEq/L 36(H) - 30  Calcium 8.4 - 10.5 mg/dL 9.6 - 9.8   Reviewed past Medical, Social and Family history today.  Outpatient Medications Prior to Visit  Medication Sig Dispense Refill  . amLODipine (NORVASC) 5 MG tablet Take 1 tablet (5 mg total) by mouth daily. 30 tablet 3  . carvedilol (COREG) 25 MG tablet Take 1 tablet (25 mg total) by mouth 2 (two) times daily. 180 tablet 3  . cetirizine (ZYRTEC) 10 MG tablet Take 1 tablet (10 mg total) by mouth at bedtime. 90 tablet 1  . citalopram (CELEXA) 10 MG tablet Take 1 tablet (10 mg total) by mouth daily. 30 tablet 3  . fluticasone (FLONASE) 50 MCG/ACT nasal spray Place 2 sprays into both nostrils 2 (two) times daily. 16 g 6  . furosemide (LASIX) 40 MG tablet TAKE 1 TABLET BY MOUTH EVERY DAY (Patient taking differently: Take 40 mg by mouth daily. ) 90 tablet 3  . hydrALAZINE (APRESOLINE) 100 MG  tablet TAKE 1 TABLET BY MOUTH THREE TIMES A DAY (Patient taking differently: Take 100 mg by mouth 3 (three) times daily. ) 270 tablet 3  . isosorbide dinitrate (ISORDIL) 30 MG tablet TAKE 1 TABLET BY MOUTH THREE TIMES A DAY (Patient taking differently: Take 30 mg by mouth 3 (three) times daily. ) 270 tablet 3   No facility-administered medications prior to visit.    ROS See HPI  Objective:  BP 128/84   Pulse 78   Temp (!) 97.5 F (36.4 C) (Tympanic)   Ht _0  (1.651 m)   Wt (!) 380 lb 6.4 oz (172.5 kg)   SpO2 100%   BMI 63.30 kg/m   Physical Exam Cardiovascular:     Rate and Rhythm: Normal rate and regular rhythm.     Pulses: Normal pulses.     Heart sounds: Normal heart sounds.  Pulmonary:     Effort: Pulmonary effort is normal.     Breath sounds: Normal breath sounds.  Musculoskeletal:     Right lower leg: No edema.     Left lower leg: No edema.  Skin:    Findings: Erythema and rash present.       Neurological:     Mental Status: She is alert and oriented to person, place, and time.     Assessment & Plan:  This visit occurred during  the SARS-CoV-2 public health emergency.  Safety protocols were in place, including screening questions prior to the visit, additional usage of staff PPE, and extensive cleaning of exam room while observing appropriate contact time as indicated for disinfecting solutions.   [name] was seen today for follow-up.  Diagnoses and all orders for this visit:  Venous stasis dermatitis of left lower extremity  CKD (chronic kidney disease) stage 4, GFR 15-29 ml/min (HCC) -     Basic metabolic panel  Cellulitis of left leg -     CBC w/Diff -     mupirocin ointment (BACTROBAN) 2 %; Apply 1 application topically 2 (two) times daily.    Problem List Items Addressed This Visit      Musculoskeletal and Integument   Venous stasis dermatitis of left lower extremity - Primary    Chronic, waxing and waning, new onset of redness on medial  aspect since recent hospitalization. Denies any fever or drainage or pain. Hyperpigmented, thick scaly skin below knee (lateral aspect of lower extremity), no ulcers  Start bactroban ointment x 1week Advise about need for adequate moisturizing and adequate skin hygiene. She is to call office if no improvement in 1week        Genitourinary   CKD (chronic kidney disease) stage 4, GFR 15-29 ml/min (HCC)    improved renal function. BMP Latest Ref Rng & Units 03/13/2020 02/27/2020 02/27/2020  Glucose 70 - 99 mg/dL 107(H) - 86  BUN 6 - 23 mg/dL 22 - 38(H)  Creatinine 0.40 - 1.20 mg/dL 1.08 - 1.39(H)  BUN/Creat Ratio 9 - 23 - - -  Sodium 135 - 145 mEq/L 136 140 139  Potassium 3.5 - 5.1 mEq/L 4.2 5.5(H) 5.5(H)  Chloride 96 - 112 mEq/L 96 - 99  CO2 19 - 32 mEq/L 36(H) - 30  Calcium 8.4 - 10.5 mg/dL 9.6 - 9.8        Relevant Orders   Basic metabolic panel (Completed)    Other Visit Diagnoses    Cellulitis of left leg       Relevant Medications   mupirocin ointment (BACTROBAN) 2 %   Other Relevant Orders   CBC w/Diff (Completed)      I have spent 75mns with this patient regarding history taking, documentation, review of hospital records (labs, radiology, specialty note), formulating plan and discussing treatment options with patient.  Follow-up: Return if symptoms worsen or fail to improve.  CWilfred Lacy NP

## 2020-03-13 NOTE — Assessment & Plan Note (Signed)
Chronic, waxing and waning, new onset of redness on medial aspect since recent hospitalization. Denies any fever or drainage or pain. Hyperpigmented, thick scaly skin below knee (lateral aspect of lower extremity), no ulcers  Start bactroban ointment x 1week Advise about need for adequate moisturizing and adequate skin hygiene. She is to call office if no improvement in 1week

## 2020-03-17 NOTE — Assessment & Plan Note (Addendum)
improved renal function. BMP Latest Ref Rng & Units 03/13/2020 02/27/2020 02/27/2020  Glucose 70 - 99 mg/dL 107(H) - 86  BUN 6 - 23 mg/dL 22 - 38(H)  Creatinine 0.40 - 1.20 mg/dL 1.08 - 1.39(H)  BUN/Creat Ratio 9 - 23 - - -  Sodium 135 - 145 mEq/L 136 140 139  Potassium 3.5 - 5.1 mEq/L 4.2 5.5(H) 5.5(H)  Chloride 96 - 112 mEq/L 96 - 99  CO2 19 - 32 mEq/L 36(H) - 30  Calcium 8.4 - 10.5 mg/dL 9.6 - 9.8

## 2020-03-19 ENCOUNTER — Ambulatory Visit: Payer: Medicare Other | Admitting: Adult Health

## 2020-03-20 ENCOUNTER — Encounter: Payer: Self-pay | Admitting: Adult Health

## 2020-03-20 ENCOUNTER — Ambulatory Visit (INDEPENDENT_AMBULATORY_CARE_PROVIDER_SITE_OTHER): Payer: Medicare Other | Admitting: Adult Health

## 2020-03-20 ENCOUNTER — Other Ambulatory Visit: Payer: Self-pay

## 2020-03-20 DIAGNOSIS — G4733 Obstructive sleep apnea (adult) (pediatric): Secondary | ICD-10-CM | POA: Diagnosis not present

## 2020-03-20 DIAGNOSIS — J189 Pneumonia, unspecified organism: Secondary | ICD-10-CM | POA: Diagnosis not present

## 2020-03-20 DIAGNOSIS — J9612 Chronic respiratory failure with hypercapnia: Secondary | ICD-10-CM | POA: Diagnosis not present

## 2020-03-20 NOTE — Patient Instructions (Signed)
Continue on Oxygen 4l/m with trach collar.  Continue with daily trach care  Activity as tolerated Work on healthy weight Follow-up in trach clinic as planned Follow-up in 3-4  weeks Dr. Halford Chessman or Terrian Ridlon NP with chest xray and As needed

## 2020-03-20 NOTE — Progress Notes (Signed)
Virtual Visit via Telephone Note  I connected with [name] [name] on 03/20/20 at 10:00 AM EDT by telephone and verified that I am speaking with the correct person using two identifiers.  Location: Patient: Home  Provider: Office    I discussed the limitations, risks, security and privacy concerns of performing an evaluation and management service by telephone and the availability of in person appointments. I also discussed with the patient that there may be a patient responsible charge related to this service. The patient expressed understanding and agreed to proceed.   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 Diagnosed with sleep apnea in October 2012 with AHI at 75/hour. Hospitalization October 2017 with critical illness requiring tracheostomy Hospitalization October 2020 with acute on chronic hypoxic and hypercarbic respiratory failure  Today's televisit is a 1 month follow-up from hospitalization. Patient was admitted last month for acute on chronic hypoxic and hypercarbic respiratory failure with pneumonia and congestive heart failure.    She was treated with aggressive IV antibiotics and did require ventilatory support.  She was weaned to trach collar at 4 L of oxygen.  She is feeling since discharge.  She says that her breathing is returning back to baseline.   No increased oxygen demands. Prior to  Admission she was capping her trach during daytime but has not returned to this yet.  Has trach clinic follow up next month.  Appetite has been fair with no nausea vomiting diarrhea.  Denies any increased leg swelling.    Observations/Objective: 11/16/2016 sleep study showed severe sleep apnea, AHI 59, optimal C Pap pressure was 12 cm of H2O (done with capped trach )  Assessment and Plan: Severe obstructive sleep apnea intolerant to CPAP.  She is  continue with her chronic trach.  Has trach clinic follow-up next month. For now very trach capping.  Continue with trach collar at 4 L.  Chronic hypoxic and hypercarbic respiratory failure with underlying OHS and obstructive sleep apnea.  Patient continue on trach collar at 4 L.  No trach capping for now  Community-acquired pneumonia.  Clinically improved after antibiotics.  Check chest x-ray on follow-up visit  Plan  Patient Instructions  Continue on Oxygen 4l/m with trach collar.  Continue with daily trach care  Activity as tolerated Work on healthy weight Follow-up in trach clinic as planned Follow-up in 3-4  weeks Dr. Halford Chessman or Alondra Sahni NP with chest xray and As needed        Follow Up Instructions: Follow-up in 3 to 4 weeks and as needed   I discussed the assessment and treatment plan with the patient. The patient was provided an opportunity to ask questions and all were answered. The patient agreed with the plan and demonstrated an understanding of the instructions.   The patient was advised to call back or seek an in-person evaluation if the symptoms worsen or if the condition fails to improve as anticipated.  I provided 22 minutes of non-face-to-face time during this encounter.   [name], NP

## 2020-03-21 ENCOUNTER — Telehealth: Payer: Self-pay | Admitting: Adult Health

## 2020-03-21 NOTE — Telephone Encounter (Signed)
Pt had a televisit with Tammy Parrett on 03/20/2020. Per pt's AVS instructions, she is to follow up with an OV with CXR in 2-3 weeks.  LMTCB x1 for pt.

## 2020-03-24 NOTE — Telephone Encounter (Signed)
LMTCB x2 for pt 

## 2020-03-24 NOTE — Telephone Encounter (Signed)
Returning ap hone call. Pt has follow up appt with TP on 04/09/20. Nothing further needed.

## 2020-03-26 ENCOUNTER — Telehealth: Payer: Self-pay | Admitting: Nurse Practitioner

## 2020-03-26 NOTE — Telephone Encounter (Signed)
Tammy is calling and wanted to let Baldo Ash know patient had a fall going upstairs. Caller stated that patient is complaining of soreness but is ok. CB is [phone].

## 2020-03-27 ENCOUNTER — Ambulatory Visit (HOSPITAL_COMMUNITY)
Admission: RE | Admit: 2020-03-27 | Discharge: 2020-03-27 | Disposition: A | Discharge status: Home / Self Care | Payer: Medicare Other | Source: Ambulatory Visit | Attending: Acute Care | Admitting: Acute Care

## 2020-03-27 ENCOUNTER — Ambulatory Visit
Admission: RE | Admit: 2020-03-27 | Discharge: 2020-03-27 | Disposition: A | Discharge status: Home / Self Care | Payer: Medicare Other | Source: Ambulatory Visit | Attending: Nurse Practitioner | Admitting: Nurse Practitioner

## 2020-03-27 ENCOUNTER — Other Ambulatory Visit: Payer: Self-pay

## 2020-03-27 DIAGNOSIS — G4733 Obstructive sleep apnea (adult) (pediatric): Secondary | ICD-10-CM | POA: Insufficient documentation

## 2020-03-27 DIAGNOSIS — J9611 Chronic respiratory failure with hypoxia: Secondary | ICD-10-CM | POA: Insufficient documentation

## 2020-03-27 DIAGNOSIS — I5032 Chronic diastolic (congestive) heart failure: Secondary | ICD-10-CM | POA: Diagnosis not present

## 2020-03-27 DIAGNOSIS — Z930 Tracheostomy status: Secondary | ICD-10-CM | POA: Diagnosis not present

## 2020-03-27 DIAGNOSIS — E66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: Secondary | ICD-10-CM

## 2020-03-27 DIAGNOSIS — Z4682 Encounter for fitting and adjustment of non-vascular catheter: Secondary | ICD-10-CM | POA: Insufficient documentation

## 2020-03-27 DIAGNOSIS — Z1231 Encounter for screening mammogram for malignant neoplasm of breast: Secondary | ICD-10-CM

## 2020-03-27 NOTE — Progress Notes (Signed)
[facility]   Reason for visit:  Routine trach change  HPI:  39 year old female. I follow her for trach management. 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 Presents today for planned trach care  ROS  Review of Systems - History obtained from the patient General ROS: negative for - chills, fatigue, fever, malaise, night sweats, sleep disturbance or weight gain Psychological ROS: negative for - anxiety, depression or we actually started her on SSRI during last admit for depression. She thinks that this has helped. tells me "I think it's given me a little more energy" ENT ROS: negative for - epistaxis, nasal congestion, sinus pain, sneezing, sore throat, vocal changes or tracheal discharge  Endocrine ROS: negative Respiratory ROS: negative for - cough, hemoptysis, pleuritic pain, shortness of breath, sputum changes, stridor, tachypnea or wheezing Cardiovascular ROS: no chest pain or dyspnea on exertion Gastrointestinal ROS: no abdominal pain, change in bowel habits, or black or bloody stools Musculoskeletal ROS: negative Neurological ROS: no TIA or stroke symptoms  Vital signs:  Reviewed  Exam:  Physical Exam Vitals reviewed.  Constitutional:      General: She is not in acute distress.    Appearance: Normal appearance. She is not ill-appearing, toxic-appearing or diaphoretic.  HENT:     Head: Normocephalic and atraumatic.     Comments: Trach stoma unremarkable. Phonation quality raspy but essentially at baseline.     Nose: Nose normal.     Mouth/Throat:     Mouth: Mucous membranes are moist.  Eyes:     Pupils: Pupils are equal, round, and reactive to light.  Cardiovascular:     Rate and Rhythm: Normal rate and regular rhythm.  Pulmonary:     Effort: Pulmonary effort is normal.     Breath sounds: Normal breath sounds.  Abdominal:      Palpations: Abdomen is soft.  Musculoskeletal:        General: Normal range of motion.     Cervical back: Normal range of motion.  Skin:    General: Skin is warm.     Capillary Refill: Capillary refill takes less than 2 seconds.  Neurological:     General: No focal deficit present.     Mental Status: She is alert.  Psychiatric:        Mood and Affect: Mood normal.     Trach change/procedure: the size 6 cuffless shiley was removed w/out difficulty. New one placed.       Impression/dx  Chronic hypoxic respiratory failure in setting of OHS OSA Trach dependence  Diastolic heart failure Depression  Discussion  [name] [name] is doing well. For the first time in close to a year she looks closer to her baseline.her mother is present with her and confirms this. At this point I think it's best to just pump the brakes and not push forward here on our hope to decannulate as she has not really been successful with this so far. I do think it's still possible but she would need to have some sig weight loss and demonstrate better tolerance and adherence to her CPAP before we could safely do this. I think we should just get her through the beginning of the school year (her daughter starts first grade this year) before we re-approach capping trials Plan  Cont routine trach care Will keep size 6 cuffless -->she is tolerating this well See  her again in 10 weeks for routine trach change     Visit time: 32 minutes.   Erick Colace ACNP-BC [place]

## 2020-03-27 NOTE — Progress Notes (Signed)
Tracheostomy Procedure Note  Royce Sciara [id number] [birth year]  Pre Procedure Tracheostomy Information  Trach Brand: Shiley Size: 6.0 Style: Uncuffed Secured by: Velcro   Procedure: Trach change and Trach education    Post Procedure Tracheostomy Information  Trach Brand: Shiley Size: 6.0 Style: Uncuffed Secured by: Velcro   Post Procedure Evaluation:  ETCO2 positive color change from yellow to purple : Yes.   Vital signs:  VSS Patients current condition: stable Complications: No apparent complications Trach site exam: clean and dry Wound care done: 4 x 4 gauze trach applied Patient did tolerate procedure well.   Education: none  Prescription needs: none    Additional needs: Pt given an PMV at this visit and an additional  #6 cufless Shiley [employer]

## 2020-03-31 NOTE — Telephone Encounter (Signed)
Please see message . Thank you .

## 2020-04-01 NOTE — Telephone Encounter (Signed)
Schedule OV if symptoms have not improved.

## 2020-04-08 NOTE — Telephone Encounter (Signed)
Tried calling pt to check on her symptoms and see how she was doing but was unable to leave a message or reach her I also sent pt a My Chart message to check on her an have her call the office and schedule an appointment if she was still having symptoms.

## 2020-04-09 ENCOUNTER — Other Ambulatory Visit: Payer: Self-pay

## 2020-04-09 ENCOUNTER — Ambulatory Visit (INDEPENDENT_AMBULATORY_CARE_PROVIDER_SITE_OTHER): Payer: Medicare Other | Admitting: Adult Health

## 2020-04-09 ENCOUNTER — Encounter: Payer: Self-pay | Admitting: Adult Health

## 2020-04-09 VITALS — BP 132/92 | HR 78 | Temp 98.4°F | Ht 65.0 in | Wt 386.0 lb

## 2020-04-09 DIAGNOSIS — Z930 Tracheostomy status: Secondary | ICD-10-CM

## 2020-04-09 DIAGNOSIS — J189 Pneumonia, unspecified organism: Secondary | ICD-10-CM

## 2020-04-09 DIAGNOSIS — J9611 Chronic respiratory failure with hypoxia: Secondary | ICD-10-CM

## 2020-04-09 DIAGNOSIS — J9612 Chronic respiratory failure with hypercapnia: Secondary | ICD-10-CM

## 2020-04-09 DIAGNOSIS — G4733 Obstructive sleep apnea (adult) (pediatric): Secondary | ICD-10-CM

## 2020-04-09 NOTE — Progress Notes (Signed)
_0  ID: [name] [name], female    DOB: [birth year], 39 y.o.   MRN: [id number]  Chief Complaint  Patient presents with  . 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  Patient was recently hospitalized for acute on chronic hypoxic and hypercarbic respiratory failure with pneumonia and congestive heart failure.  She did require aggressive IV antibiotics and ventilatory support.  I discharged she was weaned to trach collar at 4 L /40% of oxygen.  Since last visit patient says she is starting to do better.  She started to feel more like her self.  Is becoming more active has improved activity tolerance.  Today in the office she is using her Passy-Muir valve and 4 L of nasal cannula.  Last visit we had recommended not capping her trach as she was doing before.  She does continue trach collar at bedtime. She says she is working on weight loss.  Lower extremity swelling has been better.  Allergies  Allergen Reactions  . No Known Allergies     Immunization History  Administered Date(s) Administered  . Influenza,inj,Quad PF,6+ Mos 06/30/2016  . PFIZER SARS-COV-2 Vaccination 11/28/2019, 12/19/2019    Past Medical History:  Diagnosis Date  . Chronic diastolic heart failure ([place])    Echo 10/12: EF 50-55 // b. Echo 10/17: EF 50-55, mild BAE, PASP 42 // Echo 10/18: Mild  concentric LVH, EF 55-60, normal wall motion, mild MAC, severe LAE, mild TR, PASP 15, trivial pericardial effusion  . CKD (chronic kidney disease) stage 4, GFR 15-29 ml/min (HCC) 10/25/2016   admitted in 2/18 with SCr of 10 in the setting of dehydration from illness, etc >> improved to 5 at DC  . DM2 (diabetes mellitus, type 2) (HCC)    A1c 7.2 in 05/2016  . Gestational diabetes mellitus in pregnancy 05/24/2013  . HTN in pregnancy, chronic 05/24/2013  . Hypertensive heart disease with CHF (congestive heart failure) ([place]) 05/25/2016  . Morbid obesity ([place])   . OSA (obstructive sleep apnea) 07/09/2011   s/p Trach 2/2 prolonged respiratory failure during admx for CHF in 05/2016  . Pulmonary hypertension ([place])    secondary from CHF/OSA  . Sinus tachycardia     Tobacco History: Social History   Tobacco Use  Smoking Status Never Smoker  Smokeless Tobacco Never Used   Counseling given: Not Answered   Outpatient Medications Prior to Visit  Medication Sig Dispense Refill  . amLODipine (NORVASC) 5 MG tablet Take 1 tablet (5 mg total) by mouth daily. 30 tablet 3  . carvedilol (COREG) 25 MG tablet Take 1 tablet (25 mg total) by mouth 2 (two) times daily. 180 tablet 3  . cetirizine (ZYRTEC) 10 MG tablet Take 1 tablet (10 mg total) by mouth at bedtime. 90 tablet 1  . citalopram (CELEXA) 10 MG tablet Take 1 tablet (10 mg total) by mouth daily. 30 tablet 3  . fluticasone (FLONASE) 50  MCG/ACT nasal spray Place 2 sprays into both nostrils 2 (two) times daily. 16 g 6  . furosemide (LASIX) 40 MG tablet TAKE 1 TABLET BY MOUTH EVERY DAY (Patient taking differently: Take 40 mg by mouth daily. ) 90 tablet 3  . hydrALAZINE (APRESOLINE) 100 MG tablet TAKE 1 TABLET BY MOUTH THREE TIMES A DAY (Patient taking differently: Take 100 mg by mouth 3 (three) times daily. ) 270 tablet 3  . isosorbide dinitrate (ISORDIL) 30 MG tablet TAKE 1 TABLET BY MOUTH THREE TIMES A DAY (Patient taking differently: Take 30 mg by  mouth 3 (three) times daily. ) 270 tablet 3  . mupirocin ointment (BACTROBAN) 2 % Apply 1 application topically 2 (two) times daily. 60 g 0   No facility-administered medications prior to visit.     Review of Systems:   Constitutional:   No  weight loss, night sweats,  Fevers, chills, fatigue, or  lassitude.  HEENT:   No headaches,  Difficulty swallowing,  Tooth/dental problems, or  Sore throat,                No sneezing, itching, ear ache, nasal congestion, post nasal drip,   CV:  No chest pain,  Orthopnea, PND, swelling in lower extremities, anasarca, dizziness, palpitations, syncope.   GI  No heartburn, indigestion, abdominal pain, nausea, vomiting, diarrhea, change in bowel habits, loss of appetite, bloody stools.   Resp: No shortness of breath with exertion or at rest.  No excess mucus, no productive cough,  No non-productive cough,  No coughing up of blood.  No change in color of mucus.  No wheezing.  No chest wall deformity  Skin: no rash or lesions.  GU: no dysuria, change in color of urine, no urgency or frequency.  No flank pain, no hematuria   MS:  No joint pain or swelling.  No decreased range of motion.  No back pain.    Physical Exam  BP (!) 132/92 (BP Location: Left Arm, Cuff Size: Large)   Pulse 78   Temp 98.4 F (36.9 C) (Oral)   Ht _0  (1.651 m)   Wt (!) 386 lb (175.1 kg)   SpO2 100% Comment: 4 liters  BMI 64.23 kg/m   GEN: A/Ox3; pleasant , NAD, well nourished    HEENT:  [place]/AT,  EACs-clear, TMs-wnl, NOSE-clear, THROAT-clear, no lesions, no postnasal drip or exudate noted.   NECK:  Supple w/ fair ROM; no JVD; normal carotid impulses w/o bruits; no thyromegaly or nodules palpated; no lymphadenopathy.    RESP  Clear  P & A; w/o, wheezes/ rales/ or rhonchi. no accessory muscle use, no dullness to percussion  CARD:  RRR, no m/r/g, no peripheral edema, pulses intact, no cyanosis or clubbing.  GI:   Soft & nt; nml bowel sounds; no organomegaly or  masses detected.   Musco: Warm bil, no deformities or joint swelling noted.   Neuro: alert, no focal deficits noted.    Skin: Warm, no lesions or rashes    Lab Results:  CBC  BMET  No results found for: PROBNP  Imaging:     No flowsheet data found.  No results found for: NITRICOXIDE      Assessment & Plan:   No problem-specific Assessment & Plan notes found for this encounter.     [name], NP 04/09/2020

## 2020-04-09 NOTE — Patient Instructions (Addendum)
Continue on Oxygen 4l/m with trach collar.  May use Evonnie Dawes device with Nasal cannula at 4l/m , goal is to have O2 sats >90%.  Continue with daily trach care  Activity as tolerated, advance as tolerated.  Low salt diet  Work on healthy weight Follow-up in trach clinic as planned Chest xray when able .  Follow-up in 6-8 weeks Dr. Halford Chessman or Tamika Shropshire NP and As needed

## 2020-04-10 NOTE — Assessment & Plan Note (Signed)
Continue on oxygen to keep O2 saturations greater than 88 to 90%.  Patient is prone to hypercarbia.  Continue on trach collar at bedtime.  Avoid trach capping at this time. Use Passy-Muir during the daytime.  Plan  . Patient Instructions  Continue on Oxygen 4l/m with trach collar.  May use Evonnie Dawes device with Nasal cannula at 4l/m , goal is to have O2 sats >90%.  Continue with daily trach care  Activity as tolerated, advance as tolerated.  Low salt diet  Work on healthy weight Follow-up in trach clinic as planned Chest xray when able .  Follow-up in 6-8 weeks Dr. Halford Chessman or Kerstie Agent NP and As needed

## 2020-04-10 NOTE — Assessment & Plan Note (Signed)
Continue follow-up at the trach clinic

## 2020-04-10 NOTE — Assessment & Plan Note (Signed)
Severe OSA/OHS status post tracheostomy.  Patient is to continue with trach care.  Use trach collar at bedtime.  May use passive-year-old during the daytime with 4 L of nasal cannula.  Avoid trach capping at this time.  Plan  Patient Instructions  Continue on Oxygen 4l/m with trach collar.  May use [name] [name] device with Nasal cannula at 4l/m , goal is to have O2 sats >90%.  Continue with daily trach care  Activity as tolerated, advance as tolerated.  Low salt diet  Work on healthy weight Follow-up in trach clinic as planned Chest xray when able .  Follow-up in 6-8 weeks Dr. Halford Chessman or Emmajo Bennette NP and As needed

## 2020-05-21 ENCOUNTER — Ambulatory Visit: Payer: Medicare Other | Admitting: Adult Health

## 2020-05-25 NOTE — Progress Notes (Signed)
CARDIOLOGY OFFICE NOTE  Date:  06/04/2020    [name] Date of Birth: [birth year] Medical Record #[id number]  PCP:  Flossie Buffy, NP  Cardiologist:  Marisa Cyphers  Chief Complaint  Patient presents with  . Follow-up    Seen for Dr. Marlou Porch    History of Present Illness: [name] [name] is a 39 y.o. female who presents today for a follow up visit. Seen for Dr. Marlou Porch. She has primarily followed with me over the past several years.   She has a hx of HTN, OSA,morbidobesity,&gestational diabetes. She was admitted in 10/17 respiratory failure in the setting of HTN emergency with pulmonary edema and OHS/OSA requiring intubation. She ultimately underwent tracheostomy due to prolonged respiratory failure. She did have prolonged sinus pauses during coughing and straining against the ventilator. This was reviewed with EP and she was not felt to need a PPM as her pauses were likely related to underlying OSA/OHS. Echocardiogram did demonstrate pulmonary hypertension with PASP 42 and normal ejection fraction.She was admitted in 1/18 with acute renal failure in the setting of dehydration. She has been followed in our hypertension clinic to help with control of blood pressure.   Last seen by Dr. Marlou Porch 6/18.  Unfortunately continued to gain weight - has gotten over 400#. Loves sugar. Was beginning to consider bariatric surgery - tried to get her to Dr. Hassell Done here in town. She was admitted mid December with acute hypercarbic respiratory failure after attempted trach downsize. In anticipation of the trach change, she had a COVID test - found to have a sat of 60%.Plan had been for gradual downsizing of trach with goal of removal. The ER attempted to change her trach - was unable to pass - ended up being intubated. Noted "stenotic and small vocal cords". There was concern for possible PE - not noted on VQ scan - did not end up on anticoagulation. New trach  placed on 12/15.She was weaned from ventilator and on ATC 40% with sats 98-100%. Shewasd/c'd with continuous oxygen to wean at home.  I then saw her in February - still with trach collar. No plan to downsize her trach or discuss removal - probably going to be lifelong now. Was still fairly mobile. Last visit with me was back in June - she was trying to get off her oxygen. Weight continued to climb.   Admitted back in July - was more short of breath - having trouble with secretions - ended up going on the vent short term - her Norvasc was cut back - Lisinopril was held.   Comes in today. Here alone. BP last week 130/78 - 128/74 - she is not checking regularly - these were from other offices - has had Celexa added - she thinks this has helped. She is moving around more. She is on less oxygen. Still working towards getting off at least during the day. She is going to the [facility] - she is willing to consider surgery. She seems more motivated now. No chest pain. Breathing is ok. No swelling. She feels like she is doing ok.    Past Medical History:  Diagnosis Date  . Chronic diastolic heart failure ([place])    Echo 10/12: EF 50-55 // b. Echo 10/17: EF 50-55, mild BAE, PASP 42 // Echo 10/18: Mild concentric LVH, EF 55-60, normal wall motion, mild MAC, severe LAE, mild TR, PASP 15, trivial pericardial effusion  . CKD (chronic kidney disease) stage 4, GFR 15-29  ml/min ([place]) 10/25/2016   admitted in 2/18 with SCr of 10 in the setting of dehydration from illness, etc >> improved to 5 at DC  . CKD (chronic kidney disease) stage 4, GFR 15-29 ml/min (HCC) 10/25/2016  . DM2 (diabetes mellitus, type 2) (HCC)    A1c 7.2 in 05/2016  . Gestational diabetes mellitus in pregnancy 05/24/2013  . HTN in pregnancy, chronic 05/24/2013  . Hypertensive heart disease with CHF (congestive heart failure) ([place]) 05/25/2016  . Morbid obesity ([place])   . OSA (obstructive sleep apnea) 07/09/2011   s/p Trach 2/2  prolonged respiratory failure during admx for CHF in 05/2016  . Pulmonary hypertension ([place])    secondary from CHF/OSA  . Sinus tachycardia     Past Surgical History:  Procedure Laterality Date  . TRACHEOSTOMY TUBE PLACEMENT N/A 06/11/2016   Procedure: TRACHEOSTOMY;  Surgeon: Melida Quitter, MD;  Location: [facility];  Service: ENT;  Laterality: N/A;     Medications: Current Meds  Medication Sig  . amLODipine (NORVASC) 5 MG tablet Take 1 tablet (5 mg total) by mouth daily.  . carvedilol (COREG) 25 MG tablet Take 1 tablet (25 mg total) by mouth 2 (two) times daily.  . cetirizine (ZYRTEC) 10 MG tablet Take 1 tablet (10 mg total) by mouth at bedtime.  . citalopram (CELEXA) 10 MG tablet Take 1 tablet (10 mg total) by mouth daily.  . fluticasone (FLONASE) 50 MCG/ACT nasal spray Place 2 sprays into both nostrils 2 (two) times daily.  . furosemide (LASIX) 40 MG tablet TAKE 1 TABLET BY MOUTH EVERY DAY  . hydrALAZINE (APRESOLINE) 100 MG tablet TAKE 1 TABLET BY MOUTH THREE TIMES A DAY  . isosorbide dinitrate (ISORDIL) 30 MG tablet TAKE 1 TABLET BY MOUTH THREE TIMES A DAY  . mupirocin ointment (BACTROBAN) 2 % Apply 1 application topically 2 (two) times daily.     Allergies: Allergies  Allergen Reactions  . No Known Allergies     Social History: The patient  reports that she has never smoked. She has never used smokeless tobacco. She reports that she does not drink alcohol and does not use drugs.   Family History: The patient's family history includes Birth defects in her paternal grandfather; Diabetes in an other family member; Hypertension in her mother.   Review of Systems: Please see the history of present illness.   All other systems are reviewed and negative.   Physical Exam: VS:  BP (!) 136/92   Pulse 84   Ht _0  (1.651 m)   Wt (!) 392 lb 6.4 oz (178 kg)   SpO2 99%   BMI 65.30 kg/m  .  BMI Body mass index is 65.3 kg/m.  Wt Readings from Last 3 Encounters:  06/04/20 (!) 392  lb 6.4 oz (178 kg)  05/28/20 (!) 390 lb 3.2 oz (177 kg)  05/28/20 (!) 390 lb (176.9 kg)    General: Pleasant. Alert and in no acute distress. Her weight is down almost 25# since our last visit in June.   Cardiac: Heart tones are distant - she has no significant edema.  Respiratory:  Decreased breath sounds due to body habitus but with normal work of breathing. She has her trach in place.  GI: Soft and nontender.  MS: No deformity or atrophy. Gait and ROM intact.  Skin: Warm and dry. Color is normal.  Neuro:  Strength and sensation are intact and no gross focal deficits noted.  Psych: Alert, appropriate and with normal affect.  LABORATORY DATA:  EKG:  EKG is not ordered today.   Lab Results  Component Value Date   WBC 5.1 03/13/2020   HGB 13.1 03/13/2020   HCT 42.5 03/13/2020   PLT 138.0 (L) 03/13/2020   GLUCOSE 107 (H) 03/13/2020   CHOL 213 (H) 11/20/2019   TRIG 104 02/22/2020   HDL 43.20 11/20/2019   LDLDIRECT 99.0 09/15/2016   LDLCALC 144 (H) 11/20/2019   ALT 12 11/20/2019   AST 12 11/20/2019   NA 136 03/13/2020   K 4.2 03/13/2020   CL 96 03/13/2020   CREATININE 1.08 03/13/2020   BUN 22 03/13/2020   CO2 36 (H) 03/13/2020   TSH 2.970 09/20/2018   INR 1.27 09/18/2016   HGBA1C 6.2 11/20/2019   MICROALBUR <0.7 11/20/2019     BNP (last 3 results) Recent Labs    08/06/19 1824 02/18/20 0024  BNP 682.6* 677.8*    ProBNP (last 3 results) No results for input(s): PROBNP in the last 8760 hours.   Other Studies Reviewed Today:  LE Venous DopplerSummary12/2020:  Right: There is no evidence of deep vein thrombosis in the lower  extremity. However, portions of this examination were limited- see  technologist comments above. No cystic structure found in the popliteal  fossa.  Left: There is no evidence of deep vein thrombosis in the lower extremity.  However, portions of this examination were limited- see technologist  comments above. No cystic structure found  in the popliteal fossa.    *See table(s) above for measurements and observations.   Electronically signed by Deitra Mayo MD on 08/07/2019 at 7:58:22  PM.    ECHOIMPRESSIONS12/2020  1. Left ventricular ejection fraction, by visual estimation, is 60 to  65%. The left ventricle has normal function. There is no left ventricular  hypertrophy.  2. The left ventricle has no regional wall motion abnormalities.  3. Global right ventricle has normal systolic function.The right  ventricular size is normal. No increase in right ventricular wall  thickness.  4. Left atrial size was mildly dilated.  5. Right atrial size was normal.  6. Trivial pericardial effusion is present.  7. The mitral valve is normal in structure. No evidence of mitral valve  regurgitation. No evidence of mitral stenosis.  8. The tricuspid valve is normal in structure. Tricuspid valve  regurgitation is not demonstrated.  9. The aortic valve is normal in structure. Aortic valve regurgitation is  not visualized. No evidence of aortic valve sclerosis or stenosis.  10. The pulmonic valve was normal in structure. Pulmonic valve  regurgitation is not visualized.  11. TR signal is inadequate for assessing pulmonary artery systolic  pressure.  12. The inferior vena cava is dilated in size with <50% respiratory  variability, suggesting right atrial pressure of 15 mmHg.    ASSESSMENT & PLAN:   1. HTN - BP fair - has had better readings from other recent visits. She is to monitor for [place]. She is off ACE and on less Norvasc now.   2. Hypercarbic respiratory failure - tenuous situation for her.   3. Chronic diastolic HF - weight is down. She needs to be checking her BP for [place].   4. Morbid obesity - she has signed up for the health and wellness - she is willing to consider surgery - she is trying to eat less and move more - her weight is down almost 25# - encouragement given.   5. Depression - now on  Celexa - this seems to be helping  6. Chronic RBBB   Current medicines are reviewed with the patient today.  The patient does not have concerns regarding medicines other than what has been noted above.  The following changes have been made:  See above.  Labs/ tests ordered today include:    Orders Placed This Encounter  Procedures  . Basic metabolic panel  . Hepatic function panel  . Lipid panel  . Hemoglobin A1c     Disposition:   FU with Dr. Marlou Porch in 6 months. She is aware that I am leaving in February.  Her PCP is needing labs - we will do those today - she was due for labs - she may need further cardiac testing if she decides to proceed with surgery - will be available as needed. Hopefully she can continue with this positive change.    Patient is agreeable to this plan and will call if any problems develop in the interim.   SignedTruitt Merle, NP  06/04/2020 2:46 PM  [facility] [address] [place], [place]  [address] Phone: [phone] Fax: [phone]

## 2020-05-27 ENCOUNTER — Other Ambulatory Visit: Payer: Self-pay

## 2020-05-28 ENCOUNTER — Encounter: Payer: Self-pay | Admitting: Adult Health

## 2020-05-28 ENCOUNTER — Ambulatory Visit (INDEPENDENT_AMBULATORY_CARE_PROVIDER_SITE_OTHER): Payer: Medicare Other | Admitting: Adult Health

## 2020-05-28 ENCOUNTER — Encounter: Payer: Self-pay | Admitting: Nurse Practitioner

## 2020-05-28 ENCOUNTER — Ambulatory Visit (INDEPENDENT_AMBULATORY_CARE_PROVIDER_SITE_OTHER): Payer: Medicare Other | Admitting: Nurse Practitioner

## 2020-05-28 VITALS — BP 128/74 | HR 83 | Temp 98.4°F | Ht 65.0 in | Wt 390.0 lb

## 2020-05-28 VITALS — BP 130/78 | HR 82 | Temp 97.3°F | Ht 65.0 in | Wt 390.2 lb

## 2020-05-28 DIAGNOSIS — E785 Hyperlipidemia, unspecified: Secondary | ICD-10-CM | POA: Insufficient documentation

## 2020-05-28 DIAGNOSIS — Z930 Tracheostomy status: Secondary | ICD-10-CM

## 2020-05-28 DIAGNOSIS — J9612 Chronic respiratory failure with hypercapnia: Secondary | ICD-10-CM

## 2020-05-28 DIAGNOSIS — E1169 Type 2 diabetes mellitus with other specified complication: Secondary | ICD-10-CM

## 2020-05-28 DIAGNOSIS — I5032 Chronic diastolic (congestive) heart failure: Secondary | ICD-10-CM | POA: Diagnosis not present

## 2020-05-28 DIAGNOSIS — E782 Mixed hyperlipidemia: Secondary | ICD-10-CM

## 2020-05-28 DIAGNOSIS — Z23 Encounter for immunization: Secondary | ICD-10-CM

## 2020-05-28 DIAGNOSIS — J9611 Chronic respiratory failure with hypoxia: Secondary | ICD-10-CM

## 2020-05-28 DIAGNOSIS — G4733 Obstructive sleep apnea (adult) (pediatric): Secondary | ICD-10-CM | POA: Diagnosis not present

## 2020-05-28 NOTE — Assessment & Plan Note (Signed)
Morbid obesity with BMI at 30. Will refer to healthy weight and wellness clinic. Encouraged on healthy weight lost

## 2020-05-28 NOTE — Progress Notes (Addendum)
Subjective:  Patient ID: [name] [name], female    DOB: 01-06-1981  Age: 39 y.o. MRN: [id number]  CC: Follow-up (6 month f/u on hyperlipidemia/Pt is fasting today.)  HPI  Diabetes mellitus ([place]) Controlled without medication Last hgbA1c of 6.2 BP at goal She is waiting for an appt with weight loss clinic. Denies any neuropathy Will schedule appt with ophthalmology and dentist. Declined pneumonia vaccine today  Repeat hgbA1c today  Mixed hyperlipidemia ASCVD risk of 7.1% based on lipid panel completed 11/20/2019 Repeat lipid panel   Lipid Panel     Component Value Date/Time   CHOL 213 (H) 11/20/2019 1020   CHOL 235 (H) 09/20/2018 1134   TRIG 104 02/22/2020 0241   HDL 43.20 11/20/2019 1020   HDL 54 09/20/2018 1134   CHOLHDL 5 11/20/2019 1020   VLDL 25.0 11/20/2019 1020   LDLCALC 144 (H) 11/20/2019 1020   LDLCALC 164 (H) 09/20/2018 1134   LDLDIRECT 99.0 09/15/2016 0855   LABVLDL 17 09/20/2018 1134   Reviewed past Medical, Social and Family history today.  Outpatient Medications Prior to Visit  Medication Sig Dispense Refill  . amLODipine (NORVASC) 5 MG tablet Take 1 tablet (5 mg total) by mouth daily. 30 tablet 3  . carvedilol (COREG) 25 MG tablet Take 1 tablet (25 mg total) by mouth 2 (two) times daily. 180 tablet 3  . cetirizine (ZYRTEC) 10 MG tablet Take 1 tablet (10 mg total) by mouth at bedtime. 90 tablet 1  . citalopram (CELEXA) 10 MG tablet Take 1 tablet (10 mg total) by mouth daily. 30 tablet 3  . fluticasone (FLONASE) 50 MCG/ACT nasal spray Place 2 sprays into both nostrils 2 (two) times daily. 16 g 6  . furosemide (LASIX) 40 MG tablet TAKE 1 TABLET BY MOUTH EVERY DAY (Patient taking differently: Take 40 mg by mouth daily. ) 90 tablet 3  . hydrALAZINE (APRESOLINE) 100 MG tablet TAKE 1 TABLET BY MOUTH THREE TIMES A DAY (Patient taking differently: Take 100 mg by mouth 3 (three) times daily. ) 270 tablet 3  . isosorbide dinitrate (ISORDIL) 30 MG tablet  TAKE 1 TABLET BY MOUTH THREE TIMES A DAY (Patient taking differently: Take 30 mg by mouth 3 (three) times daily. ) 270 tablet 3  . lisinopril (ZESTRIL) 40 MG tablet Take 40 mg by mouth daily.    . mupirocin ointment (BACTROBAN) 2 % Apply 1 application topically 2 (two) times daily. 60 g 0   No facility-administered medications prior to visit.    ROS See HPI  Objective:  BP 130/78 (BP Location: Left Arm, Patient Position: Sitting, Cuff Size: Normal)   Pulse 82   Temp (!) 97.3 F (36.3 C) (Temporal)   Ht 5' 5" (1.651 m)   Wt (!) 390 lb 3.2 oz (177 kg)   SpO2 100%   BMI 64.93 kg/m   Wt Readings from Last 3 Encounters:  05/28/20 (!) 390 lb 3.2 oz (177 kg)  05/28/20 (!) 390 lb (176.9 kg)  04/09/20 (!) 386 lb (175.1 kg)   Physical Exam Cardiovascular:     Rate and Rhythm: Normal rate.     Pulses: Normal pulses.  Pulmonary:     Effort: Pulmonary effort is normal.     Breath sounds: No wheezing or rales.  Musculoskeletal:     Right lower leg: Edema present.     Left lower leg: Edema present.  Neurological:     Mental Status: She is alert and oriented to person, place, and time.  Psychiatric:        Mood and Affect: Mood normal.        Behavior: Behavior normal.     Assessment & Plan:  This visit occurred during the SARS-CoV-2 public health emergency.  Safety protocols were in place, including screening questions prior to the visit, additional usage of staff PPE, and extensive cleaning of exam room while observing appropriate contact time as indicated for disinfecting solutions.   [name] [name] was seen today for follow-up.  Diagnoses and all orders for this visit:  Type 2 diabetes mellitus with other specified complication, without long-term current use of insulin ([place]) -     Lipid panel; Future -     Basic metabolic panel; Future -     Hemoglobin A1c; Future  Mixed hyperlipidemia -     Lipid panel; Future   Problem List Items Addressed This Visit      Endocrine    Diabetes mellitus ([place]) - Primary    Controlled without medication Last hgbA1c of 6.2 BP at goal She is waiting for an appt with weight loss clinic. Denies any neuropathy Will schedule appt with ophthalmology and dentist. Declined pneumonia vaccine today  Repeat hgbA1c today      Relevant Medications   lisinopril (ZESTRIL) 40 MG tablet   Other Relevant Orders   Lipid panel   Basic metabolic panel   Hemoglobin A1c     Other   Mixed hyperlipidemia    ASCVD risk of 7.1% based on lipid panel completed 11/20/2019 Repeat lipid panel       Relevant Medications   lisinopril (ZESTRIL) 40 MG tablet   Other Relevant Orders   Lipid panel      Follow-up: Return in about 6 months (around 11/26/2020) for CPE (fasting, breast and PAP needed).  Wilfred Lacy, NP

## 2020-05-28 NOTE — Assessment & Plan Note (Addendum)
Controlled without medication Last hgbA1c of 6.2 BP at goal She is waiting for an appt with weight loss clinic. Denies any neuropathy Will schedule appt with ophthalmology and dentist. Declined pneumonia vaccine today  Repeat hgbA1c today

## 2020-05-28 NOTE — Progress Notes (Signed)
_0  ID: [name] [name], female    DOB: 04-Jan-1981, 39 y.o.   MRN: [id number]  Chief Complaint  Patient presents with  . Follow-up    O2 RF     Referring provider: Flossie Buffy, NP  HPI: 40 year old female followed for severe obstructive sleep apnea/OHS, hypercarbic and hypoxic respiratory failure with chronic tracheostomy Diagnosed with sleep apnea in October 2012 with AHI as 75/hour Hospitalization October 2017 with critical illness requiring tracheostomy Hospitalization October 2020 with acute on chronic hypoxic and hypercarbic respiratory failure  TEST/EVENTS :  11/16/2016 sleep study showed severe sleep apnea, AHI 59, optimal C Pap pressure was 12 cm of H2O (done with capped trach )  05/28/2020 Follow up : OSA/OHS and O2 RF  Patient returns for a 77-monthfollow-up.  Patient is followed for chronic hypoxic and hypercarbic respiratory failure with obstructive sleep apnea and OHS.  She did have hospitalization this summer for congestive heart failure and pneumonia.  She required vent support and IV antibiotics.  She was discharged on trach collar with oxygen at 40%.  Last visit patient was improving and returning back to her preadmission baseline.  She uses her Passy-Muir valve intermittently.  And was on 4 L of nasal cannula. Since last visit patient is feeling says she is doing okay. Feels like she is getting some stronger. She has been able to cut down on her oxygen down to 3 L at rest and with activity. She does use 4 L/40% at bedtime. She really wants to get back to where she was prior to hospitalization where she kept her trach during the daytime and use trach collar at bedtime. She feels that she cannot be as active using the oxygen canisters. Walk test in the office patient was able to maintain O2 saturations at 2 L on continuous flow walking. She was unable to maintain O2 saturations greater than 90% with a portable oxygen concentrator. We discussed weight loss.  Patient says she has tried. Has never been to a weight loss center.    Allergies  Allergen Reactions  . No Known Allergies     Immunization History  Administered Date(s) Administered  . Influenza,inj,Quad PF,6+ Mos 06/30/2016  . PFIZER SARS-COV-2 Vaccination 11/28/2019, 12/19/2019    Past Medical History:  Diagnosis Date  . Chronic diastolic heart failure ([place]    Echo 10/12: EF 50-55 // b. Echo 10/17: EF 50-55, mild BAE, PASP 42 // Echo 10/18: Mild concentric LVH, EF 55-60, normal wall motion, mild MAC, severe LAE, mild TR, PASP 15, trivial pericardial effusion  . CKD (chronic kidney disease) stage 4, GFR 15-29 ml/min (HCC) 10/25/2016   admitted in 2/18 with SCr of 10 in the setting of dehydration from illness, etc >> improved to 5 at DC  . DM2 (diabetes mellitus, type 2) (HCC)    A1c 7.2 in 05/2016  . Gestational diabetes mellitus in pregnancy 05/24/2013  . HTN in pregnancy, chronic 05/24/2013  . Hypertensive heart disease with CHF (congestive heart failure) ([facility] 05/25/2016  . Morbid obesity ([facility]   . OSA (obstructive sleep apnea) 07/09/2011   s/p Trach 2/2 prolonged respiratory failure during admx for CHF in 05/2016  . Pulmonary hypertension ([place]    secondary from CHF/OSA  . Sinus tachycardia     Tobacco History: Social History   Tobacco Use  Smoking Status Never Smoker  Smokeless Tobacco Never Used   Counseling given: Not Answered   Outpatient Medications Prior to Visit  Medication Sig Dispense Refill  .  amLODipine (NORVASC) 5 MG tablet Take 1 tablet (5 mg total) by mouth daily. 30 tablet 3  . carvedilol (COREG) 25 MG tablet Take 1 tablet (25 mg total) by mouth 2 (two) times daily. 180 tablet 3  . cetirizine (ZYRTEC) 10 MG tablet Take 1 tablet (10 mg total) by mouth at bedtime. 90 tablet 1  . citalopram (CELEXA) 10 MG tablet Take 1 tablet (10 mg total) by mouth daily. 30 tablet 3  . fluticasone (FLONASE) 50 MCG/ACT nasal spray Place 2 sprays into both nostrils 2  (two) times daily. 16 g 6  . furosemide (LASIX) 40 MG tablet TAKE 1 TABLET BY MOUTH EVERY DAY (Patient taking differently: Take 40 mg by mouth daily. ) 90 tablet 3  . hydrALAZINE (APRESOLINE) 100 MG tablet TAKE 1 TABLET BY MOUTH THREE TIMES A DAY (Patient taking differently: Take 100 mg by mouth 3 (three) times daily. ) 270 tablet 3  . isosorbide dinitrate (ISORDIL) 30 MG tablet TAKE 1 TABLET BY MOUTH THREE TIMES A DAY (Patient taking differently: Take 30 mg by mouth 3 (three) times daily. ) 270 tablet 3  . mupirocin ointment (BACTROBAN) 2 % Apply 1 application topically 2 (two) times daily. 60 g 0   No facility-administered medications prior to visit.     Review of Systems:   Constitutional:   No  weight loss, night sweats,  Fevers, chills, + fatigue, or  lassitude.  HEENT:   No headaches,  Difficulty swallowing,  Tooth/dental problems, or  Sore throat,                No sneezing, itching, ear ache, nasal congestion, post nasal drip,   CV:  No chest pain,  Orthopnea, PND, swelling in lower extremities, anasarca, dizziness, palpitations, syncope.   GI  No heartburn, indigestion, abdominal pain, nausea, vomiting, diarrhea, change in bowel habits, loss of appetite, bloody stools.   Resp: No excess mucus, no productive cough,  No non-productive cough,  No coughing up of blood.  No change in color of mucus.  No wheezing.  No chest wall deformity  Skin: no rash or lesions.  GU: no dysuria, change in color of urine, no urgency or frequency.  No flank pain, no hematuria   MS:  No joint pain or swelling.  No decreased range of motion.  No back pain.    Physical Exam  BP 128/74 (BP Location: Left Arm, Cuff Size: Large)   Pulse 83   Temp 98.4 F (36.9 C) (Temporal)   Ht _0  (1.651 m)   Wt (!) 390 lb (176.9 kg)   SpO2 99%   BMI 64.90 kg/m   GEN: A/Ox3; pleasant , NAD, well nourished    HEENT:  [place]/AT,   , NOSE-clear, THROAT-clear, no lesions, no postnasal drip or exudate noted.    NECK:  Supple w/ fair ROM; no JVD; normal carotid impulses w/o bruits; no thyromegaly or nodules palpated; no lymphadenopathy.   Trachea is midline, appears clean and dry. passey muir in place with raspy voice.  RESP  Clear  P & A; w/o, wheezes/ rales/ or rhonchi. no accessory muscle use, no dullness to percussion  CARD:  RRR, no m/r/g, ttr-1 peripheral edema, pulses intact, no cyanosis or clubbing.  GI:   Soft & nt; nml bowel sounds; no organomegaly or masses detected.   Musco: Warm bil, no deformities or joint swelling noted.   Neuro: alert, no focal deficits noted.    Skin: Warm, no lesions or rashes  Lab Results:  BMET  BNP  ProBNP No results found for: PROBNP  Imaging: No results found.    No flowsheet data found.  No results found for: NITRICOXIDE      Assessment & Plan:   Chronic respiratory failure with hypoxia and hypercapnia (HCC) Chronic hypoxic and hypercarbic respiratory failure with underlying OSA and OHS. Patient is continue on oxygen. Unable to tolerate POC but was able to decrease oxygen down to 2 L on continuous flow. Will order small E tanks to help with portability.  Plan  Patient Instructions  Continue on Oxygen 4l/m with trach collar At bedtime   May use Evonnie Dawes device with Nasal cannula at 2-3l/m , goal is to have O2 sats >90%.  Continue with daily trach care  Activity as tolerated, advance as tolerated.  Low salt diet  Work on healthy weight Follow-up in trach clinic as planned Refer to Healthy weight and wellness.  Follow-up in 3 months Dr. Halford Chessman or Larraine Argo NP and As needed   Please contact office for sooner follow up if symptoms do not improve or worsen or seek emergency care       OSA (obstructive sleep apnea) Obstructive sleep apnea with OHS. Patient's continue on trach and oxygen. Patient is aware to keep trach on capped at nighttime and use trach collar.  Chronic diastolic heart failure (HCC) Appears compensated  without overt overload on exam. Continue on present regimen  Morbid obesity ([place]) Morbid obesity with BMI at 64. Will refer to healthy weight and wellness clinic. Encouraged on healthy weight lost  Tracheostomy dependence ([place]) Continue with trach care. Continue follow-up with trach clinic     [name], NP 05/28/2020

## 2020-05-28 NOTE — Assessment & Plan Note (Signed)
Obstructive sleep apnea with OHS. Patient's continue on trach and oxygen. Patient is aware to keep trach on capped at nighttime and use trach collar.

## 2020-05-28 NOTE — Addendum Note (Signed)
Addended by: Claudette Head A on: 05/28/2020 12:26 PM   Modules accepted: Orders

## 2020-05-28 NOTE — Patient Instructions (Signed)
Have lab draw by cardiologist's office.  Schedule nurse visit for pneumonia and TDAP vaccine when you are ready  Schedule appt with GYN for IUD change.

## 2020-05-28 NOTE — Progress Notes (Signed)
Reviewed and agree with assessment/plan.   Chesley Mires, MD [facility] Pulmonary/Critical Care 05/28/2020, 12:42 PM Pager:  [phone]

## 2020-05-28 NOTE — Assessment & Plan Note (Signed)
ASCVD risk of 7.1% based on lipid panel completed 11/20/2019 Repeat lipid panel

## 2020-05-28 NOTE — Patient Instructions (Addendum)
Continue on Oxygen 4l/m with trach collar At bedtime   May use Evonnie Dawes device with Nasal cannula at 2-3l/m , goal is to have O2 sats >90%.  Continue with daily trach care  Activity as tolerated, advance as tolerated.  Low salt diet  Work on healthy weight Follow-up in trach clinic as planned Refer to [facility].  Follow-up in 3 months Dr. Halford Chessman or Ayaan Ringle NP and As needed   Please contact office for sooner follow up if symptoms do not improve or worsen or seek emergency care

## 2020-05-28 NOTE — Assessment & Plan Note (Signed)
Chronic hypoxic and hypercarbic respiratory failure with underlying OSA and OHS. Patient is continue on oxygen. Unable to tolerate POC but was able to decrease oxygen down to 2 L on continuous flow. Will order small E tanks to help with portability.  Plan  Patient Instructions  Continue on Oxygen 4l/m with trach collar At bedtime   May use Evonnie Dawes device with Nasal cannula at 2-3l/m , goal is to have O2 sats >90%.  Continue with daily trach care  Activity as tolerated, advance as tolerated.  Low salt diet  Work on healthy weight Follow-up in trach clinic as planned Refer to [facility].  Follow-up in 3 months Dr. Halford Chessman or [name] Doane NP and As needed   Please contact office for sooner follow up if symptoms do not improve or worsen or seek emergency care

## 2020-05-28 NOTE — Assessment & Plan Note (Signed)
Appears compensated without overt overload on exam. Continue on present regimen

## 2020-05-28 NOTE — Assessment & Plan Note (Signed)
Continue with trach care. Continue follow-up with trach clinic

## 2020-05-28 NOTE — Addendum Note (Signed)
Addended by: Claudette Head A on: 05/28/2020 12:23 PM   Modules accepted: Orders

## 2020-05-29 ENCOUNTER — Telehealth: Payer: Self-pay | Admitting: Nurse Practitioner

## 2020-05-29 ENCOUNTER — Other Ambulatory Visit: Payer: Self-pay

## 2020-05-29 MED ORDER — AMLODIPINE BESYLATE 5 MG PO TABS: 5.0000 mg | ORAL_TABLET | Freq: Every day | ORAL | 2 refills | Status: DC

## 2020-05-29 NOTE — Telephone Encounter (Signed)
*  STAT* If patient is at the pharmacy, call can be transferred to refill team.   1. Which medications need to be refilled? (please list name of each medication and dose if known) amLODipine (NORVASC) 5 MG tablet  2. Which pharmacy/location (including street and city if local pharmacy) is medication to be sent to? [id number] [place] [address]  3. Do they need a 30 day or 90 day supply? 90 day Patient is out of meds.

## 2020-06-04 ENCOUNTER — Ambulatory Visit (INDEPENDENT_AMBULATORY_CARE_PROVIDER_SITE_OTHER): Payer: Medicare Other | Admitting: Nurse Practitioner

## 2020-06-04 ENCOUNTER — Other Ambulatory Visit: Payer: Self-pay

## 2020-06-04 ENCOUNTER — Encounter: Payer: Self-pay | Admitting: Nurse Practitioner

## 2020-06-04 VITALS — BP 136/92 | HR 84 | Ht 65.0 in | Wt 392.4 lb

## 2020-06-04 DIAGNOSIS — R7309 Other abnormal glucose: Secondary | ICD-10-CM

## 2020-06-04 DIAGNOSIS — E782 Mixed hyperlipidemia: Secondary | ICD-10-CM

## 2020-06-04 DIAGNOSIS — I5032 Chronic diastolic (congestive) heart failure: Secondary | ICD-10-CM | POA: Diagnosis not present

## 2020-06-04 DIAGNOSIS — I110 Hypertensive heart disease with heart failure: Secondary | ICD-10-CM | POA: Diagnosis not present

## 2020-06-04 NOTE — Patient Instructions (Addendum)
After Visit Summary:  We will be checking the following labs today - BMET, HPF, Lipids and A1C   Medication Instructions:    Continue with your current medicines.    If you need a refill on your cardiac medications before your next appointment, please call your pharmacy.     Testing/Procedures To Be Arranged:  N/A  Follow-Up:   See Dr. Marlou Porch in 6 months    At [facility], you and your health needs are our priority.  As part of our continuing mission to provide you with exceptional heart care, we have created designated Provider Care Teams.  These Care Teams include your primary Cardiologist (physician) and Advanced Practice Providers (APPs -  Physician Assistants and Nurse Practitioners) who all work together to provide you with the care you need, when you need it.  Special Instructions:  . Stay safe, wash your hands for at least 20 seconds and wear a mask when needed.  . It was good to talk with you today.  Marland Kitchen Keep a check on your blood pressure - goal is 130/80 consistently    Call the [place] office at [phone] if you have any questions, problems or concerns.

## 2020-06-05 ENCOUNTER — Telehealth: Payer: Self-pay

## 2020-06-05 DIAGNOSIS — E782 Mixed hyperlipidemia: Secondary | ICD-10-CM

## 2020-06-05 LAB — BASIC METABOLIC PANEL
BUN/Creatinine Ratio: 17 (ref 9–23)
BUN: 13 mg/dL (ref 6–24)
CO2: 32 mmol/L — ABNORMAL HIGH (ref 20–29)
Calcium: 9.4 mg/dL (ref 8.7–10.2)
Chloride: 95 mmol/L — ABNORMAL LOW (ref 96–106)
Creatinine, Ser: 0.78 mg/dL (ref 0.57–1.00)
GFR calc Af Amer: 110 mL/min/{1.73_m2} (ref >59)
GFR calc non Af Amer: 95 mL/min/{1.73_m2} (ref >59)
Glucose: 91 mg/dL (ref 65–99)
Potassium: 3.6 mmol/L (ref 3.5–5.2)
Sodium: 140 mmol/L (ref 134–144)

## 2020-06-05 LAB — HEPATIC FUNCTION PANEL
ALT: 13 IU/L (ref 0–32)
AST: 13 IU/L (ref 0–40)
Albumin: 4.1 g/dL (ref 3.8–4.8)
Alkaline Phosphatase: 111 IU/L (ref 44–121)
Bilirubin Total: 0.4 mg/dL (ref 0.0–1.2)
Bilirubin, Direct: 0.14 mg/dL (ref 0.00–0.40)
Total Protein: 7.8 g/dL (ref 6.0–8.5)

## 2020-06-05 LAB — LIPID PANEL
Chol/HDL Ratio: 4.1 ratio (ref 0.0–4.4)
Cholesterol, Total: 232 mg/dL — ABNORMAL HIGH (ref 100–199)
HDL: 56 mg/dL (ref >39)
LDL Chol Calc (NIH): 156 mg/dL — ABNORMAL HIGH (ref 0–99)
Triglycerides: 112 mg/dL (ref 0–149)
VLDL Cholesterol Cal: 20 mg/dL (ref 5–40)

## 2020-06-05 LAB — HEMOGLOBIN A1C
Est. average glucose Bld gHb Est-mCnc: 111 mg/dL
Hgb A1c MFr Bld: 5.5 % (ref 4.8–5.6)

## 2020-06-05 MED ORDER — ATORVASTATIN CALCIUM 10 MG PO TABS
10.0000 mg | ORAL_TABLET | Freq: Every day | ORAL | 3 refills | Status: DC
Start: 2020-06-05 — End: 2021-03-03

## 2020-06-05 NOTE — Telephone Encounter (Signed)
-----  Message from Burtis Junes, NP sent at 06/05/2020  7:38 AM EDT ----- Ok to report. Labs are stable - except for her lipids - would like to add Lipitor 10 mg a day - recheck lipids and LFts in 6 weeks - copy of her labs to her PCP. Her A1C has improved - she needs to keep working on diet/weight loss/exercise and otherwise, would continue on current regimen.

## 2020-06-05 NOTE — Telephone Encounter (Signed)
Reviewed results with patient who verbalized understanding.   Instructed the patient to START LIPITOR 10 mg daily. She lives in [place], [place] - she will have repeat fasting labs drawn at [facility] in [place] 6-7 weeks after starting atorvastatin.  She will keep working on diet/weight loss/exercise. She was grateful for call and agrees with treatment plan.

## 2020-06-11 ENCOUNTER — Inpatient Hospital Stay (HOSPITAL_COMMUNITY): Admission: RE | Admit: 2020-06-11 | Payer: Medicare Other | Source: Ambulatory Visit

## 2020-06-27 ENCOUNTER — Telehealth: Payer: Self-pay | Admitting: Nurse Practitioner

## 2020-06-27 DIAGNOSIS — F321 Major depressive disorder, single episode, moderate: Secondary | ICD-10-CM

## 2020-06-27 NOTE — Telephone Encounter (Signed)
Pt called because she needs a refill on citalopram (CELEXA) 10 MG tablet, she said she is out

## 2020-06-27 NOTE — Telephone Encounter (Signed)
Ok to refill Send 90tabs with 1refill

## 2020-06-30 MED ORDER — CITALOPRAM HYDROBROMIDE 10 MG PO TABS: 10.0000 mg | ORAL_TABLET | Freq: Every day | ORAL | 1 refills | Status: DC

## 2020-06-30 NOTE — Telephone Encounter (Signed)
Patient notified and verbalized understanding. 

## 2020-06-30 NOTE — Telephone Encounter (Signed)
Patient is calling to check the status of her refill. Please give her a call back at [phone].

## 2020-07-01 ENCOUNTER — Ambulatory Visit (INDEPENDENT_AMBULATORY_CARE_PROVIDER_SITE_OTHER): Payer: Medicare Other | Admitting: Family Medicine

## 2020-07-01 ENCOUNTER — Other Ambulatory Visit: Payer: Self-pay

## 2020-07-01 ENCOUNTER — Encounter (INDEPENDENT_AMBULATORY_CARE_PROVIDER_SITE_OTHER): Payer: Self-pay | Admitting: Family Medicine

## 2020-07-01 VITALS — BP 146/70 | HR 84 | Temp 98.1°F | Ht 64.0 in | Wt 393.0 lb

## 2020-07-01 DIAGNOSIS — J9610 Chronic respiratory failure, unspecified whether with hypoxia or hypercapnia: Secondary | ICD-10-CM

## 2020-07-01 DIAGNOSIS — I152 Hypertension secondary to endocrine disorders: Secondary | ICD-10-CM

## 2020-07-01 DIAGNOSIS — R5383 Other fatigue: Secondary | ICD-10-CM | POA: Diagnosis not present

## 2020-07-01 DIAGNOSIS — I509 Heart failure, unspecified: Secondary | ICD-10-CM

## 2020-07-01 DIAGNOSIS — F3289 Other specified depressive episodes: Secondary | ICD-10-CM

## 2020-07-01 DIAGNOSIS — E1159 Type 2 diabetes mellitus with other circulatory complications: Secondary | ICD-10-CM

## 2020-07-01 DIAGNOSIS — R0602 Shortness of breath: Secondary | ICD-10-CM | POA: Diagnosis not present

## 2020-07-01 DIAGNOSIS — Z0289 Encounter for other administrative examinations: Secondary | ICD-10-CM

## 2020-07-01 DIAGNOSIS — Z6841 Body Mass Index (BMI) 40.0 and over, adult: Secondary | ICD-10-CM | POA: Diagnosis not present

## 2020-07-01 DIAGNOSIS — E785 Hyperlipidemia, unspecified: Secondary | ICD-10-CM

## 2020-07-01 DIAGNOSIS — E1169 Type 2 diabetes mellitus with other specified complication: Secondary | ICD-10-CM

## 2020-07-03 NOTE — Progress Notes (Signed)
Dear [name] Bon, NP,   Thank you for referring [name] [name] to our clinic. The following note includes my evaluation and treatment recommendations.  Chief Complaint:   OBESITY [name] [name] (MR# [id number]) is a 39 y.o. female who presents for evaluation and treatment of obesity and related comorbidities. Current BMI is Body mass index is 67.46 kg/m. [name] [name] has been struggling with her weight for many years and has been unsuccessful in either losing weight, maintaining weight loss, or reaching her healthy weight goal.  [name] [name] is currently in the action stage of change and ready to dedicate time achieving and maintaining a healthier weight. [name] [name] is interested in becoming our patient and working on intensive lifestyle modifications including (but not limited to) diet and exercise for weight loss.  [name] [name]'s sister is getting married in October 2022.  [name] [name] says she will be the [employer].  Sh is on O2 at 2-3 liters nasal cannula by day and 4 liters nasal cannula at night.  [name] [name]'s habits were reviewed today and are as follows: Her family eats meals together, she thinks her family will eat healthier with her, she has been heavy most of her life, she started gaining weight in her late 3s, her heaviest weight ever was 394 pounds, she is a picky eater and doesn't like to eat healthier foods, she craves sweets, she snacks frequently in the evenings, she skips lunch frequently, she frequently makes poor food choices, she frequently eats larger portions than normal and she struggles with emotional eating.  Depression Screen [name] [name]'s Food and Mood (modified PHQ-9) score was 10.  Depression screen PHQ 2/9 07/01/2020  Decreased Interest 2  Down, Depressed, Hopeless 2  PHQ - 2 Score 4  Altered sleeping 1  Tired, decreased energy 1  Change in appetite 1  Feeling bad or failure about yourself  1  Trouble concentrating 2  Moving slowly or fidgety/restless 0    Suicidal thoughts 0  PHQ-9 Score 10  Difficult doing work/chores Somewhat difficult  Some recent data might be hidden   Assessment/Plan:   1. Other fatigue [name] [name] denies daytime somnolence and admits to waking up still tired. Patent has a history of symptoms of daytime fatigue, morning fatigue and snoring. [name] [name] generally gets 7 or 8 hours of sleep per night, and states that she has generally restful sleep. Snoring is present. Apneic episodes are present. Epworth Sleepiness Score is 2.  [name] does feel that her weight is causing her energy to be lower than it should be. Fatigue may be related to obesity, depression or many other causes. Labs will be ordered, and in the meanwhile, [name] [name] will focus on self care including making healthy food choices, increasing physical activity and focusing on stress reduction.  - EKG 12-Lead  2. SOB (shortness of breath) on exertion [name] [name] notes increasing shortness of breath with exercising and seems to be worsening over time with weight gain. She notes getting out of breath sooner with activity than she used to. This has gotten worse recently. [name] [name] denies shortness of breath at rest or orthopnea.  [name] [name] does feel that she gets out of breath more easily that she used to when she exercises. [name] [name]'s shortness of breath appears to be obesity related and exercise induced. She has agreed to work on weight loss and gradually increase exercise to treat her exercise induced shortness of breath. Will continue to monitor closely.  3. Chronic congestive heart failure, ([place]) [name] is followed by Cardiology.  Those  extensive notes were reviewed together.   4. Chronic respiratory failure ([place]) She is followed by Pulmonology and Respiratory and has a trach. We also reviewed those notes.  5. Type 2 diabetes mellitus with other specified complication, without long-term current use of insulin ([place]) Diabetes Mellitus: well controlled. Medication: None.  Issues reviewed with her: blood sugar goals, complications of diabetes mellitus, hypoglycemia prevention and treatment, exercise, and nutrition.  Lab Results  Component Value Date   HGBA1C 5.5 06/04/2020   HGBA1C 6.2 11/20/2019   HGBA1C 6.2 09/15/2016   Lab Results  Component Value Date   MICROALBUR <0.7 11/20/2019   LDLCALC 156 (H) 06/04/2020   CREATININE 0.78 06/04/2020   6. Hyperlipidemia associated with type 2 diabetes mellitus ([place]) Course: Not at goal.  Lipid-lowering medications: Lipitor 10 mg daily. Plan: Dietary changes: Increase soluble fiber. Decrease simple carbohydrates. Exercise changes: An average 40 minutes of moderate to vigorous-intensity aerobic activity 3 or 4 times per week.   Lab Results  Component Value Date   CHOL 232 (H) 06/04/2020   HDL 56 06/04/2020   LDLCALC 156 (H) 06/04/2020   LDLDIRECT 99.0 09/15/2016   TRIG 112 06/04/2020   CHOLHDL 4.1 06/04/2020   Lab Results  Component Value Date   ALT 13 06/04/2020   AST 13 06/04/2020   ALKPHOS 111 06/04/2020   BILITOT 0.4 06/04/2020   The 10-year ASCVD risk score [name] Bussing DC Jr., et al., 2013) is: 7.8%   Values used to calculate the score:     Age: 47 years     Sex: Female     Is Non-Hispanic African American: Yes     Diabetic: Yes     Tobacco smoker: No     Systolic Blood Pressure: 371 mmHg     Is BP treated: Yes     HDL Cholesterol: 56 mg/dL     Total Cholesterol: 232 mg/dL  7. Hypertension associated with type 2 diabetes mellitus ([name] [name]) Not at goal. Medications: Norvasc 5 mg daily, Coreg 25 mg daily, Isordil 30 mg three times daily. Plan: Avoid buying foods that are: processed, frozen, or prepackaged to avoid excess salt. We will continue to monitor symptoms as they relate to her weight loss journey.  BP Readings from Last 3 Encounters:  07/01/20 (!) 146/70  06/04/20 (!) 136/92  05/28/20 130/78   Lab Results  Component Value Date   CREATININE 0.78 06/04/2020   8. Other depression, with  emotional eating Recently started Celexa and says it is helpful for her depression.  PHQ-9 is 10.  9. Class 3 severe obesity with serious comorbidity and body mass index (BMI) of 60.0 to 69.9 in adult, unspecified obesity type (HCC)  Wally is currently in the action stage of change and her goal is to continue with weight loss efforts. I recommend Sunny begin the structured treatment plan as follows:  She has agreed to the Category 2 Plan.  Exercise goals: No exercise has been prescribed at this time.   Behavioral modification strategies: increasing lean protein intake, decreasing simple carbohydrates, increasing vegetables, increasing water intake, decreasing sodium intake and increasing high fiber foods.  She was informed of the importance of frequent follow-up visits to maximize her success with intensive lifestyle modifications for her multiple health conditions. She was informed we would discuss her lab results at her next visit unless there is a critical issue that needs to be addressed sooner. Mellanie agreed to keep her next visit at the agreed upon time to discuss these results.  Objective:   Blood pressure (!) 146/70, pulse 84, temperature 98.1 F (36.7 C), temperature source Oral, height _0  (1.626 m), weight (!) 393 lb (178.3 kg), SpO2 92 %, unknown if currently breastfeeding. Body mass index is 67.46 kg/m.  EKG: Normal sinus rhythm, rate 83 bpm.  Indirect Calorimeter Her calculated basal metabolic rate is 2979.  General: Cooperative, alert, well developed, in no acute distress. HEENT: Conjunctivae and lids unremarkable. Cardiovascular: Regular rhythm.  Lungs: Normal work of breathing. Neurologic: No focal deficits.   Lab Results  Component Value Date   CREATININE 0.78 06/04/2020   BUN 13 06/04/2020   NA 140 06/04/2020   K 3.6 06/04/2020   CL 95 (L) 06/04/2020   CO2 32 (H) 06/04/2020   Lab Results  Component Value Date   ALT 13 06/04/2020   AST 13 06/04/2020    ALKPHOS 111 06/04/2020   BILITOT 0.4 06/04/2020   Lab Results  Component Value Date   HGBA1C 5.5 06/04/2020   HGBA1C 6.2 11/20/2019   HGBA1C 6.2 09/15/2016   HGBA1C 7.9 (H) 05/25/2016   Lab Results  Component Value Date   TSH 2.970 09/20/2018   Lab Results  Component Value Date   CHOL 232 (H) 06/04/2020   HDL 56 06/04/2020   LDLCALC 156 (H) 06/04/2020   LDLDIRECT 99.0 09/15/2016   TRIG 112 06/04/2020   CHOLHDL 4.1 06/04/2020   Lab Results  Component Value Date   WBC 5.1 03/13/2020   HGB 13.1 03/13/2020   HCT 42.5 03/13/2020   MCV 86.7 03/13/2020   PLT 138.0 (L) 03/13/2020   Lab Results  Component Value Date   IRON 54 09/19/2016   TIBC 227 (L) 09/19/2016   Obesity Behavioral Intervention:   Approximately 15 minutes were spent on the discussion below.  ASK: We discussed the diagnosis of obesity with Jakyla today and Kabella agreed to give [place] permission to discuss obesity behavioral modification therapy today.  ASSESS: Riley has the diagnosis of obesity and her BMI today is 67.5. Saara is in the action stage of change.   ADVISE: Paetyn was educated on the multiple health risks of obesity as well as the benefit of weight loss to improve her health. She was advised of the need for long term treatment and the importance of lifestyle modifications to improve her current health and to decrease her risk of future health problems.  AGREE: Multiple dietary modification options and treatment options were discussed and Enjoli agreed to follow the recommendations documented in the above note.  ARRANGE: Annalyn was educated on the importance of frequent visits to treat obesity as outlined per CMS and USPSTF guidelines and agreed to schedule her next follow up appointment today.  Attestation Statements:   This is the patient's first visit at [facility]. The patient's NEW PATIENT PACKET was reviewed at length. Included in the packet: current and past  health history, medications, allergies, ROS, gynecologic history (women only), surgical history, family history, social history, weight history, weight loss surgery history (for those that have had weight loss surgery), nutritional evaluation, mood and food questionnaire, PHQ9, Epworth questionnaire, sleep habits questionnaire, patient life and health improvement goals questionnaire. These will all be scanned into the patient's chart under media.   During the visit, I independently reviewed the patient's EKG, bioimpedance scale results, and indirect calorimeter results. I used this information to tailor a meal plan for the patient that will help her to lose weight and will improve her obesity-related conditions going forward.  I performed a medically necessary appropriate examination and/or evaluation. I discussed the assessment and treatment plan with the patient. The patient was provided an opportunity to ask questions and all were answered. The patient agreed with the plan and demonstrated an understanding of the instructions. Labs were ordered at this visit and will be reviewed at the next visit unless more critical results need to be addressed immediately. Clinical information was updated and documented in the EMR.   I, Water quality scientist, CMA, am acting as transcriptionist for Briscoe Deutscher, DO  I have reviewed the above documentation for accuracy and completeness, and I agree with the above. Briscoe Deutscher, DO

## 2020-07-07 ENCOUNTER — Other Ambulatory Visit (HOSPITAL_COMMUNITY): Payer: Medicare Other

## 2020-07-07 ENCOUNTER — Other Ambulatory Visit (HOSPITAL_COMMUNITY)
Admission: RE | Admit: 2020-07-07 | Discharge: 2020-07-07 | Disposition: A | Discharge status: Home / Self Care | Payer: Medicare Other | Source: Ambulatory Visit | Attending: Adult Health | Admitting: Adult Health

## 2020-07-07 DIAGNOSIS — Z01812 Encounter for preprocedural laboratory examination: Secondary | ICD-10-CM | POA: Insufficient documentation

## 2020-07-07 DIAGNOSIS — Z20822 Contact with and (suspected) exposure to covid-19: Secondary | ICD-10-CM | POA: Insufficient documentation

## 2020-07-07 LAB — SARS CORONAVIRUS 2 (TAT 6-24 HRS): SARS Coronavirus 2: NEGATIVE

## 2020-07-09 ENCOUNTER — Ambulatory Visit (HOSPITAL_COMMUNITY)
Admission: RE | Admit: 2020-07-09 | Discharge: 2020-07-09 | Disposition: A | Discharge status: Home / Self Care | Payer: Medicare Other | Source: Ambulatory Visit | Attending: Acute Care | Admitting: Acute Care

## 2020-07-09 ENCOUNTER — Other Ambulatory Visit: Payer: Self-pay | Admitting: Acute Care

## 2020-07-09 ENCOUNTER — Other Ambulatory Visit: Payer: Self-pay

## 2020-07-09 DIAGNOSIS — Z930 Tracheostomy status: Secondary | ICD-10-CM

## 2020-07-09 DIAGNOSIS — Z430 Encounter for attention to tracheostomy: Secondary | ICD-10-CM | POA: Insufficient documentation

## 2020-07-09 DIAGNOSIS — Z9981 Dependence on supplemental oxygen: Secondary | ICD-10-CM | POA: Diagnosis not present

## 2020-07-09 DIAGNOSIS — I5032 Chronic diastolic (congestive) heart failure: Secondary | ICD-10-CM | POA: Diagnosis not present

## 2020-07-09 DIAGNOSIS — E66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: Secondary | ICD-10-CM | POA: Diagnosis not present

## 2020-07-09 DIAGNOSIS — J9610 Chronic respiratory failure, unspecified whether with hypoxia or hypercapnia: Secondary | ICD-10-CM | POA: Insufficient documentation

## 2020-07-09 DIAGNOSIS — G4733 Obstructive sleep apnea (adult) (pediatric): Secondary | ICD-10-CM | POA: Diagnosis not present

## 2020-07-09 NOTE — Progress Notes (Signed)
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  Last time I saw [name] [name] was while she was hospitalized for acute exacerbation of diastolic heart failure and decompensated cor pulmonale at which time she required mechanical ventilation she has since been oxygen dependent.  Happy to see she has been sick making slow progression over the last 3 months at home.  She has joined community weight loss and wellness program, she will be checking in with her first way in next week.  From a pulmonary standpoint she remains on 2 L via nasal cannula, she still has her size 6 cuffless tracheostomy.  She is denies new headache, fevers, sinus congestion, sore throat, voice changes, difficulty swallowing, tracheal secretions or cough, or worsening shortness of breath.  She has had no worsening of lower extremity edema, her activity tolerance has been slowly improving.  Review of systems: Per above  Exam General: This is a very pleasant 39 year old female who is ambulatory presents tracheostomy clinic today for expected/plan change. HEENT normocephalic atraumatic no jugular venous distention appreciated, her size 6 tracheostomy is in place and midline.  Her phonation quality is hoarse but this is her baseline, tracheostomy stoma is unremarkable on exam during trach change Pulmonary: Clear to auscultation no accessory use currently 2 L via nasal cannula Cardiac: Distant regular rate and rhythm Abdomen soft nontender Extremities are without edema  Procedure: Tracheostomy change The current size 6 tracheostomy was removed, tracheostomy stoma was inspected and was found to be unremarkable without odor, discharge or drainage.  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  I think last winter and Covid really set her back physically and emotionally, resulting in worsening weight loss, decreased compliance with sleep apnea, and overall setback in her health, she seems to be back on track, certainly looks better from a general health perspective.  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  I would be open to decannulation if we can ever get her to commit to CPAP at night but this is been a somewhat of a psychological hurdle for her so for now she is tracheostomy dependent  Plan Continue routine trach care Return to trach clinic in 12 weeks for routine trach change She has follow-up with pulmonary in about 8 weeks, she is hopeful that he will be able to transition to a more mobile form of supplemental oxygen, she is currently pulling around the larger unit on wheels  My time 23 minutes Erick Colace ACNP-BC Door Pager # [phone] OR # [phone] if no answer

## 2020-07-09 NOTE — Progress Notes (Signed)
Tracheostomy Procedure Note  Ivori Storr [id number] [birth year]  Pre Procedure Tracheostomy Information  Trach Brand: Shiley Size: 6.0 Style: Uncuffed Secured by: Velcro   Procedure: Trach change and trach cleaning New Shiley Style trach placed today #6 cuffless flexible trach ( [id number])   Post Procedure Tracheostomy Information  Trach Brand: Shiley Size: 6.0 Style: Uncuffed Secured by: Velcro   Post Procedure Evaluation:  ETCO2 positive color change from yellow to purple : Yes.   Vital signs.  VSS Patients current condition: stable Complications: No apparent complications Trach site exam: clean and dry Wound care done: 4 x 4 gauze drain gauze applied Patient did tolerate procedure well.   Education: Patient taught how to take care of new Shiley style trach  Prescription needs: New Shiley style information sent to  [facility] by P.Babcock,NP    Additional needs: New PMV given to patient as well as an additonal # 6 cuffless flexible trach

## 2020-07-15 ENCOUNTER — Other Ambulatory Visit: Payer: Self-pay

## 2020-07-15 ENCOUNTER — Encounter (INDEPENDENT_AMBULATORY_CARE_PROVIDER_SITE_OTHER): Payer: Self-pay | Admitting: Family Medicine

## 2020-07-15 ENCOUNTER — Ambulatory Visit (INDEPENDENT_AMBULATORY_CARE_PROVIDER_SITE_OTHER): Payer: Medicare Other | Admitting: Family Medicine

## 2020-07-15 VITALS — BP 133/73 | HR 72 | Temp 98.0°F | Ht 64.0 in | Wt 381.0 lb

## 2020-07-15 DIAGNOSIS — E785 Hyperlipidemia, unspecified: Secondary | ICD-10-CM | POA: Diagnosis not present

## 2020-07-15 DIAGNOSIS — E1169 Type 2 diabetes mellitus with other specified complication: Secondary | ICD-10-CM | POA: Diagnosis not present

## 2020-07-15 DIAGNOSIS — Z6841 Body Mass Index (BMI) 40.0 and over, adult: Secondary | ICD-10-CM | POA: Diagnosis not present

## 2020-07-15 DIAGNOSIS — I509 Heart failure, unspecified: Secondary | ICD-10-CM | POA: Diagnosis not present

## 2020-07-21 NOTE — Progress Notes (Signed)
Chief Complaint:   OBESITY Kerri is here to discuss her progress with her obesity treatment plan along with follow-up of her obesity related diagnoses.   Today's visit was #: 2 Starting weight: 393 lbs Starting date: 07/01/2020 Today's weight: 381 lbs Today's date: 07/15/2020 Total lbs lost to date: 12 lbs Body mass index is 65.4 kg/m.  Total weight loss percentage to date: -3.05%  Interim History: Adeleigh says her blood sugars are running 130s-140s at home. Nutrition Plan: the Category 2 Plan for 90-95% of the time.  Activity: Josely says she is doing more walking.  Assessment/Plan:   1. Type 2 diabetes mellitus with other specified complication, without long-term current use of insulin ([place]) Diabetes Mellitus: well controlled.  Issues reviewed with her: blood sugar goals, complications of diabetes mellitus, hypoglycemia prevention and treatment, exercise, and nutrition.  Lab Results  Component Value Date   HGBA1C 5.5 06/04/2020   HGBA1C 6.2 11/20/2019   HGBA1C 6.2 09/15/2016   Lab Results  Component Value Date   MICROALBUR <0.7 11/20/2019   LDLCALC 156 (H) 06/04/2020   CREATININE 0.78 06/04/2020   2. Hyperlipidemia associated with type 2 diabetes mellitus (HCC) Lipid-lowering medications: Lipitor 10 mg daily.   Plan: Dietary changes: Increase soluble fiber. Decrease simple carbohydrates. Exercise changes: An average 40 minutes of moderate to vigorous-intensity aerobic activity 3 or 4 times per week.   Lab Results  Component Value Date   CHOL 232 (H) 06/04/2020   HDL 56 06/04/2020   LDLCALC 156 (H) 06/04/2020   LDLDIRECT 99.0 09/15/2016   TRIG 112 06/04/2020   CHOLHDL 4.1 06/04/2020   Lab Results  Component Value Date   ALT 13 06/04/2020   AST 13 06/04/2020   ALKPHOS 111 06/04/2020   BILITOT 0.4 06/04/2020   The 10-year ASCVD risk score [name], et al., 2013) is: 5.2%   Values used to calculate the score:     Age: 79 years     Sex: Female      Is Non-Hispanic African American: Yes     Diabetic: Yes     Tobacco smoker: No     Systolic Blood Pressure: 924 mmHg     Is BP treated: Yes     HDL Cholesterol: 56 mg/dL     Total Cholesterol: 232 mg/dL  3. Other congestive heart failure (HCC) Disease process and medications reviewed with an emphasis on salt restriction, regular exercise, and regular weight monitoring.   4. Class 3 severe obesity with serious comorbidity and body mass index (BMI) of 60.0 to 69.9 in adult, unspecified obesity type [facility])  Course: Areebah is currently in the action stage of change. As such, her goal is to continue with weight loss efforts.   Nutrition goals: She has agreed to keeping a food journal and adhering to recommended goals of 1200 calories and 95 grams of protein.   Exercise goals: As tolerated.  Behavioral modification strategies: increasing lean protein intake, decreasing simple carbohydrates, increasing vegetables, increasing water intake and decreasing liquid calories.  Salem has agreed to follow-up with our clinic in 2-3 weeks. She was informed of the importance of frequent follow-up visits to maximize her success with intensive lifestyle modifications for her multiple health conditions.   Objective:   Blood pressure 133/73, pulse 72, temperature 98 F (36.7 C), temperature source Oral, height _0  (1.626 m), weight (!) 381 lb (172.8 kg), SpO2 97 %, unknown if currently breastfeeding. Body mass index is 65.4 kg/m.  General: Cooperative,  alert, well developed, in no acute distress. HEENT: Conjunctivae and lids unremarkable. Cardiovascular: Regular rhythm.  Lungs: Normal work of breathing. Neurologic: No focal deficits.   Lab Results  Component Value Date   CREATININE 0.78 06/04/2020   BUN 13 06/04/2020   NA 140 06/04/2020   K 3.6 06/04/2020   CL 95 (L) 06/04/2020   CO2 32 (H) 06/04/2020   Lab Results  Component Value Date   ALT 13 06/04/2020   AST 13 06/04/2020    ALKPHOS 111 06/04/2020   BILITOT 0.4 06/04/2020   Lab Results  Component Value Date   HGBA1C 5.5 06/04/2020   HGBA1C 6.2 11/20/2019   HGBA1C 6.2 09/15/2016   HGBA1C 7.9 (H) 05/25/2016   Lab Results  Component Value Date   TSH 2.970 09/20/2018   Lab Results  Component Value Date   CHOL 232 (H) 06/04/2020   HDL 56 06/04/2020   LDLCALC 156 (H) 06/04/2020   LDLDIRECT 99.0 09/15/2016   TRIG 112 06/04/2020   CHOLHDL 4.1 06/04/2020   Lab Results  Component Value Date   WBC 5.1 03/13/2020   HGB 13.1 03/13/2020   HCT 42.5 03/13/2020   MCV 86.7 03/13/2020   PLT 138.0 (L) 03/13/2020   Lab Results  Component Value Date   IRON 54 09/19/2016   TIBC 227 (L) 09/19/2016    Obesity Behavioral Intervention:   Approximately 15 minutes were spent on the discussion below.  ASK: We discussed the diagnosis of obesity with Lauree today and Laurin agreed to give [place] permission to discuss obesity behavioral modification therapy today.  ASSESS: Skyllar has the diagnosis of obesity and her BMI today is 65.5. Onesti is in the action stage of change.   ADVISE: Shanikqua was educated on the multiple health risks of obesity as well as the benefit of weight loss to improve her health. She was advised of the need for long term treatment and the importance of lifestyle modifications to improve her current health and to decrease her risk of future health problems.  AGREE: Multiple dietary modification options and treatment options were discussed and Sandrina agreed to follow the recommendations documented in the above note.  ARRANGE: Fraidy was educated on the importance of frequent visits to treat obesity as outlined per CMS and USPSTF guidelines and agreed to schedule her next follow up appointment today.  Attestation Statements:   Reviewed by clinician on day of visit: allergies, medications, problem list, medical history, surgical history, family history, social history, and previous  encounter notes.  I, Water quality scientist, CMA, am acting as transcriptionist for Briscoe Deutscher, DO  I have reviewed the above documentation for accuracy and completeness, and I agree with the above. Briscoe Deutscher, DO

## 2020-07-22 ENCOUNTER — Other Ambulatory Visit: Payer: Self-pay | Admitting: Nurse Practitioner

## 2020-07-22 DIAGNOSIS — F321 Major depressive disorder, single episode, moderate: Secondary | ICD-10-CM

## 2020-08-04 ENCOUNTER — Ambulatory Visit (INDEPENDENT_AMBULATORY_CARE_PROVIDER_SITE_OTHER): Payer: Medicare Other | Admitting: Family Medicine

## 2020-08-29 ENCOUNTER — Encounter: Payer: Self-pay | Admitting: Adult Health

## 2020-08-29 ENCOUNTER — Other Ambulatory Visit: Payer: Self-pay

## 2020-08-29 ENCOUNTER — Ambulatory Visit (INDEPENDENT_AMBULATORY_CARE_PROVIDER_SITE_OTHER): Payer: Medicare Other | Admitting: Adult Health

## 2020-08-29 DIAGNOSIS — I5030 Unspecified diastolic (congestive) heart failure: Secondary | ICD-10-CM | POA: Diagnosis not present

## 2020-08-29 DIAGNOSIS — E66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: Secondary | ICD-10-CM | POA: Diagnosis not present

## 2020-08-29 DIAGNOSIS — G4733 Obstructive sleep apnea (adult) (pediatric): Secondary | ICD-10-CM

## 2020-08-29 DIAGNOSIS — J9612 Chronic respiratory failure with hypercapnia: Secondary | ICD-10-CM

## 2020-08-29 DIAGNOSIS — J9611 Chronic respiratory failure with hypoxia: Secondary | ICD-10-CM | POA: Diagnosis not present

## 2020-08-29 NOTE — Patient Instructions (Addendum)
Continue on Oxygen 2l/m with trach plugged/Passey Muir valve on  Evaluate for POC .  Continue on Trach collar 4l/m at bedtime with trach unplugged.  Continue with daily trach care  Activity as tolerated, advance as tolerated.  Low salt diet  Work on healthy weight Follow-up in trach clinic as planned Refer to Healthy weight and wellness.  Follow-up in 3 months Dr. Halford Chessman or Sophia Sperry NP and As needed   Please contact office for sooner follow up if symptoms do not improve or worsen or seek emergency care

## 2020-08-29 NOTE — Addendum Note (Signed)
Addended by: Vanessa Barbara on: 08/29/2020 01:50 PM   Modules accepted: Orders

## 2020-08-29 NOTE — Progress Notes (Signed)
Reviewed and agree with assessment/plan.   Chesley Mires, MD [facility] Pulmonary/Critical Care 08/29/2020, 1:29 PM Pager:  [phone]

## 2020-08-29 NOTE — Assessment & Plan Note (Signed)
Severe OSA with OHS tracheostomy dependent Unfortunately patient has been unable to tolerate CPAP in the past.  Her ultimate goal or desire is to be decannulated and be off oxygen however we have not been able to obtain this goal.  She is moving in the right direction is made improvement since last visit.  We continue with her weight loss efforts.  For now can continue on current oxygen settings to keep O2 saturations greater than 88 to 90%.  PLAN Patient Instructions  Continue on Oxygen 2l/m with trach plugged/Passey Muir valve on  Evaluate for POC .  Continue on Trach collar 4l/m at bedtime with trach unplugged.  Continue with daily trach care  Activity as tolerated, advance as tolerated.  Low salt diet  Work on healthy weight Follow-up in trach clinic as planned Refer to Healthy weight and wellness.  Follow-up in 3 months Dr. Halford Chessman or Jung Yurchak NP and As needed   Please contact office for sooner follow up if symptoms do not improve or worsen or seek emergency care

## 2020-08-29 NOTE — Assessment & Plan Note (Signed)
Patient appears to be euvolemic.  Continue on low-salt diet.  No changes in current regimen

## 2020-08-29 NOTE — Assessment & Plan Note (Signed)
>>  ASSESSMENT AND PLAN FOR HEART FAILURE WITH PRESERVED EJECTION FRACTION (HCC) WRITTEN ON 08/29/2020  1:25 PM BY PARRETT, TAMMY S, NP  Patient appears to be euvolemic.  Continue on low-salt diet.  No changes in current regimen

## 2020-08-29 NOTE — Assessment & Plan Note (Signed)
Patient continues to require oxygen to keep O2 saturations greater than 88 to 90%.  For now continue on 2 L of oxygen with trach plug/Passy-Muir valve.  Use 4 L / 40% at nighttime with trach collar with trach unplugged. Patient does qualify for a portable oxygen concentrator this will help her to be more mobile and allow for exercise.  She will require 5 L pulsing oxygen to maintain her today saturations greater than 88 to 90%.  Order sent to DME.

## 2020-08-29 NOTE — Assessment & Plan Note (Signed)
Continue on current regimen.  Weight loss is encouraged

## 2020-08-29 NOTE — Progress Notes (Signed)
_0  ID: [name] [name], female    DOB: 03/01/1981, 40 y.o.   MRN: [id number]  Chief Complaint  Patient presents with  . Follow-up  . Sleep Apnea    Referring provider: Flossie Buffy, NP  HPI: 40 year old female followed for severe obstructive sleep apnea/OHS, hypercarbic and hypoxic respiratory failure with chronic tracheostomy.  Patient was diagnosed with sleep apnea in October 2012 with an AHI as 75/hour.  Hospitalization October 2017 with critical illness requiring tracheostomy Hospitalization October 2020 with acute on chronic hypoxic and hypercarbic respiratory failure Hospitalization June 2021 with acute on chronic respiratory failure with congestive heart failure and pneumonia required vent support  TEST/EVENTS :  11/16/2016 sleep study showed severe sleep apnea, AHI 59, optimal C Pap pressure was 12 cm of H2O (done with capped trach )  2D echo August 07, 2019 EF 60 to 16%, RV systolic function normal RV size normal  08/29/2020 Follow up : OSA  O2 RF  Patient returns for 68-monthfollow-up.  Patient is followed for chronic hypoxic and hypercarbic respiratory failure with obstructive sleep apnea and obesity hypoventilation syndrome she is prone to recurrent hospitalizations with acute on chronic respiratory failure.  Last hospitalization was June 2021.  During that admission she required ventilator support with acute on chronic respiratory failure with pneumonia and congestive heart failure.  Over the last 6 months since admission she has been slowly getting back to her baseline prior to admission.  Last visit she had decreased her oxygen down to 4 L nasal cannula and was able to use her trach with her Passy-Muir valve.  Currently patient says she is improving.  She has been referred to the healthy weight and wellness.  Has been able to lose 12 pounds.  Is becoming more active.  She is down on her oxygen to 2 L nasal cannula when her trach is plugged or uses her  Passy-Muir valve.  She uses 4 L / 40% at bedtime with trach collar and trach unplugged.  She says at home if she takes her oxygen off and is sitting still with her trach and Passy-Muir all she can maintain her O2 saturations greater than 90%. We discussed getting her a more portable oxygen system to help with her exercise and activity to help in weight loss.  Today in the office patient required 5 L of pulsed oxygen on portable concentrator to maintain O2 saturations greater than 90% with trach using Passy-Muir off. Patient continues to go to the trach clinic.  She denies any drainage or redness at trach site.    Allergies  Allergen Reactions  . No Known Allergies     Immunization History  Administered Date(s) Administered  . Influenza,inj,Quad PF,6+ Mos 06/30/2016, 05/28/2020  . PFIZER SARS-COV-2 Vaccination 11/28/2019, 12/19/2019    Past Medical History:  Diagnosis Date  . [place]    Echo 10/12: EF 50-55 // b. Echo 10/17: EF 50-55, mild BAE, PASP 42 // Echo 10/18: Mild concentric LVH, EF 55-60, normal wall motion, mild MAC, severe LAE, mild TR, PASP 15, trivial pericardial effusion  . CKD (chronic kidney disease) stage 4, GFR 15-29 ml/min (HCC) 10/25/2016   admitted in 2/18 with SCr of 10 in the setting of dehydration from illness, etc >> improved to 5 at DC  . CKD (chronic kidney disease) stage 4, GFR 15-29 ml/min (HCC) 10/25/2016  . DM2 (diabetes mellitus, type 2) (HCC)    A1c 7.2 in 05/2016  . Gestational diabetes mellitus in  pregnancy 05/24/2013  . HLD (hyperlipidemia)   . HTN (hypertension)   . HTN in pregnancy, chronic 05/24/2013  . Hypertensive heart disease with CHF (congestive heart failure) ([place]) 05/25/2016  . Lower extremity edema   . Morbid obesity ([place])   . OSA (obstructive sleep apnea) 07/09/2011   s/p Trach 2/2 prolonged respiratory failure during admx for CHF in 05/2016  . Prediabetes   . Pulmonary hypertension ([place])    secondary from  CHF/OSA  . Sinus tachycardia   . SOB (shortness of breath)     Tobacco History: Social History   Tobacco Use  Smoking Status Never Smoker  Smokeless Tobacco Never Used   Counseling given: Not Answered   Outpatient Medications Prior to Visit  Medication Sig Dispense Refill  . amLODipine (NORVASC) 5 MG tablet Take 1 tablet (5 mg total) by mouth daily. 90 tablet 2  . atorvastatin (LIPITOR) 10 MG tablet Take 1 tablet (10 mg total) by mouth daily. 90 tablet 3  . carvedilol (COREG) 25 MG tablet Take 1 tablet (25 mg total) by mouth 2 (two) times daily. 180 tablet 3  . citalopram (CELEXA) 10 MG tablet TAKE 1 TABLET BY MOUTH EVERY DAY 90 tablet 1  . furosemide (LASIX) 40 MG tablet TAKE 1 TABLET BY MOUTH EVERY DAY 90 tablet 3  . hydrALAZINE (APRESOLINE) 100 MG tablet TAKE 1 TABLET BY MOUTH THREE TIMES A DAY 270 tablet 3  . isosorbide dinitrate (ISORDIL) 30 MG tablet TAKE 1 TABLET BY MOUTH THREE TIMES A DAY 270 tablet 3  . mupirocin ointment (BACTROBAN) 2 % Apply 1 application topically 2 (two) times daily. (Patient not taking: Reported on 08/29/2020) 60 g 0   No facility-administered medications prior to visit.     Review of Systems:   Constitutional:   No  weight loss, night sweats,  Fevers, chills, + fatigue, or  lassitude.  HEENT:   No headaches,  Difficulty swallowing,  Tooth/dental problems, or  Sore throat,                No sneezing, itching, ear ache, nasal congestion, post nasal drip,   CV:  No chest pain,  Orthopnea, PND, +swelling in lower extremities, NO anasarca, dizziness, palpitations, syncope.   GI  No heartburn, indigestion, abdominal pain, nausea, vomiting, diarrhea, change in bowel habits, loss of appetite, bloody stools.   Resp:  .  No chest wall deformity  Skin: no rash or lesions.  GU: no dysuria, change in color of urine, no urgency or frequency.  No flank pain, no hematuria   MS:  No joint pain or swelling.  No decreased range of motion.  No back  pain.    Physical Exam  BP 120/60 (BP Location: Left Arm, Patient Position: Sitting, Cuff Size: Large)   Pulse 88   Temp 99 F (37.2 C) (Temporal)   Ht 5' (1.524 m)   Wt (!) 393 lb 3.2 oz (178.4 kg)   SpO2 93%   BMI 76.79 kg/m   GEN: A/Ox3; pleasant , NAD, BMI 76   HEENT:  [place]/AT,  , NOSE-clear, THROAT-clear, no lesions, no postnasal drip or exudate noted.   NECK:  Supple w/ fair ROM; no JVD; normal carotid impulses w/o bruits; no thyromegaly or nodules palpated; no lymphadenopathy.  Trach midline dressing clean and dry  RESP  Clear  P & A; w/o, wheezes/ rales/ or rhonchi. no accessory muscle use, no dullness to percussion  CARD:  RRR, no m/r/g, 1+ peripheral edema, pulses intact,  no cyanosis or clubbing.  Stasis dermatitis changes  GI:   Soft & nt; nml bowel sounds; no organomegaly or masses detected.   [place]co: Warm bil, no deformities or joint swelling noted.   Neuro: alert, no focal deficits noted.    Skin: Warm, no lesions or rashes    Lab Results:    ProBNP No results found for: PROBNP  Imaging: No results found.    No flowsheet data found.  No results found for: NITRICOXIDE      Assessment & Plan:   Obstructive sleep apnea hypopnea, severe Severe OSA with OHS tracheostomy dependent Unfortunately patient has been unable to tolerate CPAP in the past.  Her ultimate goal or desire is to be decannulated and be off oxygen however we have not been able to obtain this goal.  She is moving in the right direction is made improvement since last visit.  We continue with her weight loss efforts.  For now can continue on current oxygen settings to keep O2 saturations greater than 88 to 90%.  PLAN Patient Instructions  Continue on Oxygen 2l/m with trach plugged/Passey Muir valve on  Evaluate for POC .  Continue on Trach collar 4l/m at bedtime with trach unplugged.  Continue with daily trach care  Activity as tolerated, advance as tolerated.  Low salt diet   Work on healthy weight Follow-up in trach clinic as planned Refer to Healthy weight and wellness.  Follow-up in 3 months Dr. Halford Chessman or Mykia Holton NP and As needed   Please contact office for sooner follow up if symptoms do not improve or worsen or seek emergency care       (HFpEF) heart failure with preserved ejection fraction [facility]) Patient appears to be euvolemic.  Continue on low-salt diet.  No changes in current regimen  Chronic respiratory failure with hypoxia and hypercapnia ([place]) Patient continues to require oxygen to keep O2 saturations greater than 88 to 90%.  For now continue on 2 L of oxygen with trach plug/Passy-Muir valve.  Use 4 L / 40% at nighttime with trach collar with trach unplugged. Patient does qualify for a portable oxygen concentrator this will help her to be more mobile and allow for exercise.  She will require 5 L pulsing oxygen to maintain her today saturations greater than 88 to 90%.  Order sent to DME.  Obesity hypoventilation syndrome (HCC) Continue on current regimen.  Weight loss is encouraged     [name], NP 08/29/2020

## 2020-09-03 ENCOUNTER — Other Ambulatory Visit: Payer: Self-pay

## 2020-09-03 ENCOUNTER — Encounter (INDEPENDENT_AMBULATORY_CARE_PROVIDER_SITE_OTHER): Payer: Self-pay | Admitting: Family Medicine

## 2020-09-03 ENCOUNTER — Ambulatory Visit (INDEPENDENT_AMBULATORY_CARE_PROVIDER_SITE_OTHER): Payer: Medicare Other | Admitting: Family Medicine

## 2020-09-03 VITALS — BP 132/74 | HR 93 | Temp 98.0°F | Ht 65.0 in | Wt 388.0 lb

## 2020-09-03 DIAGNOSIS — E1169 Type 2 diabetes mellitus with other specified complication: Secondary | ICD-10-CM

## 2020-09-03 DIAGNOSIS — G4733 Obstructive sleep apnea (adult) (pediatric): Secondary | ICD-10-CM

## 2020-09-03 DIAGNOSIS — I5030 Unspecified diastolic (congestive) heart failure: Secondary | ICD-10-CM | POA: Diagnosis not present

## 2020-09-03 DIAGNOSIS — E8881 Metabolic syndrome: Secondary | ICD-10-CM

## 2020-09-03 DIAGNOSIS — E65 Localized adiposity: Secondary | ICD-10-CM

## 2020-09-03 DIAGNOSIS — Z6841 Body Mass Index (BMI) 40.0 and over, adult: Secondary | ICD-10-CM | POA: Diagnosis not present

## 2020-09-03 MED ORDER — OZEMPIC (0.25 OR 0.5 MG/DOSE) 2 MG/1.5ML ~~LOC~~ SOPN: PEN_INJECTOR | SUBCUTANEOUS | 0 refills | Status: DC

## 2020-09-08 NOTE — Progress Notes (Signed)
Chief Complaint:   OBESITY [name] [name] is here to discuss her progress with her obesity treatment plan along with follow-up of her obesity related diagnoses.   Today's visit was #: 3 Starting weight: 393 lbs Starting date: 07/01/2020 Today's weight: 388 lbs Today's date: 09/03/2020 Total lbs lost to date: 5 lbs Body mass index is 64.57 kg/m.  Total weight loss percentage to date: -1.27%  Interim History: [name] [name] says she overindulged over the holidays.  She is having a difficult time getting back on track.  She endorses increased polyphagia and increased cravings.  Nutrition Plan: keeping a food journal and adhering to recommended goals of 1200 calories and 95 grams of protein.  Hunger is poorly controlled. Cravings are poorly controlled.  Activity: She says she is doing more walking.  Assessment/Plan:   1. Type 2 diabetes mellitus with other specified complication, without long-term current use of insulin ([place]) Issues reviewed with her: blood sugar goals, complications of diabetes mellitus, hypoglycemia prevention and treatment, exercise, and nutrition.  Lab Results  Component Value Date   HGBA1C 5.5 06/04/2020   HGBA1C 6.2 11/20/2019   HGBA1C 6.2 09/15/2016   Lab Results  Component Value Date   MICROALBUR <0.7 11/20/2019   LDLCALC 156 (H) 06/04/2020   CREATININE 0.78 06/04/2020   - Start Semaglutide,0.25 or 0.5MG/DOS, (OZEMPIC, 0.25 OR 0.5 MG/DOSE,) 2 MG/1.5ML SOPN; Inject 0.25MG weekly for 2 weeks, then 0.5MG weekly  Dispense: 1.5 mL; Refill: 0  2. Heart failure with preserved ejection fraction, chronic Disease process and medications reviewed with an emphasis on salt restriction, regular exercise, and regular weight monitoring.   3. Severe obstructive sleep apnea Reviewed Pulmonology note.  OSA is a cause of systemic hypertension and is associated with an increased incidence of stroke, heart failure, atrial fibrillation, and coronary heart disease. Severe OSA  increases all-cause mortality and  cardiovascular mortality.   Goal: Treatment of OSA via CPAP compliance and weight loss. . Plasma ghrelin levels (appetite or "hunger hormone") are significantly higher in OSA patients than in BMI-matched controls, but decrease to levels similar to those of obese patients without OSA after CPAP treatment.  . Weight loss improves OSA by several mechanisms, including reduction in fatty tissue in the throat (i.e. parapharyngeal fat) and the tongue. Loss of abdominal fat increases mediastinal traction on the upper airway making it less likely to collapse during sleep. . Studies have also shown that compliance with CPAP treatment improves leptin (hunger inhibitory hormone) imbalance.  4. Metabolic syndrome Starting goal: Lose 7-10% of starting weight. She will continue to focus on protein-rich, low simple carbohydrate foods. We reviewed the importance of hydration, regular exercise for stress reduction, and restorative sleep.  We will continue to check lab work every 3 months, with 10% weight loss, or should any other concerns arise.  5. Visceral obesity Current visceral fat rating: 30. Visceral fat rating should be < 13. Visceral adipose tissue is a hormonally active component of total body fat. This body composition phenotype is associated with medical disorders such as metabolic syndrome, cardiovascular disease and several malignancies including prostate, breast, and colorectal cancers. Starting goal: Lose 7-10% of starting weight.   6. Class 3 severe obesity with serious comorbidity and body mass index (BMI) of 60.0 to 69.9 in adult, unspecified obesity type [facility])  Course: [name] [name] is currently in the action stage of change. As such, her goal is to continue with weight loss efforts.   Nutrition goals: She has agreed to keeping a food journal  and adhering to recommended goals of 1200 calories and 95 grams of protein.   Exercise goals: As tolerated.  Behavioral  modification strategies: increasing lean protein intake, decreasing simple carbohydrates, increasing vegetables, increasing water intake and decreasing liquid calories.  [name] [name] has agreed to follow-up with our clinic in 2 weeks. She was informed of the importance of frequent follow-up visits to maximize her success with intensive lifestyle modifications for her multiple health conditions.   Objective:   Blood pressure 132/74, pulse 93, temperature 98 F (36.7 C), temperature source Oral, height _0  (1.651 m), weight (!) 388 lb (176 kg), SpO2 90 %, unknown if currently breastfeeding. Body mass index is 64.57 kg/m.  General: Cooperative, alert, well developed, in no acute distress. HEENT: Conjunctivae and lids unremarkable. Cardiovascular: Regular rhythm.  Lungs: Normal work of breathing. Neurologic: No focal deficits.   Lab Results  Component Value Date   CREATININE 0.78 06/04/2020   BUN 13 06/04/2020   NA 140 06/04/2020   K 3.6 06/04/2020   CL 95 (L) 06/04/2020   CO2 32 (H) 06/04/2020   Lab Results  Component Value Date   ALT 13 06/04/2020   AST 13 06/04/2020   ALKPHOS 111 06/04/2020   BILITOT 0.4 06/04/2020   Lab Results  Component Value Date   HGBA1C 5.5 06/04/2020   HGBA1C 6.2 11/20/2019   HGBA1C 6.2 09/15/2016   HGBA1C 7.9 (H) 05/25/2016   Lab Results  Component Value Date   TSH 2.970 09/20/2018   Lab Results  Component Value Date   CHOL 232 (H) 06/04/2020   HDL 56 06/04/2020   LDLCALC 156 (H) 06/04/2020   LDLDIRECT 99.0 09/15/2016   TRIG 112 06/04/2020   CHOLHDL 4.1 06/04/2020   Lab Results  Component Value Date   WBC 5.1 03/13/2020   HGB 13.1 03/13/2020   HCT 42.5 03/13/2020   MCV 86.7 03/13/2020   PLT 138.0 (L) 03/13/2020   Lab Results  Component Value Date   IRON 54 09/19/2016   TIBC 227 (L) 09/19/2016   Obesity Behavioral Intervention:   Approximately 15 minutes were spent on the discussion below.  ASK: We discussed the diagnosis of  obesity with [name] [name] today and [name] [name] agreed to give [place] permission to discuss obesity behavioral modification therapy today.  ASSESS: [name] [name] has the diagnosis of obesity and her BMI today is 64.6. [name] [name] is in the action stage of change.   ADVISE: [name] [name] was educated on the multiple health risks of obesity as well as the benefit of weight loss to improve her health. She was advised of the need for long term treatment and the importance of lifestyle modifications to improve her current health and to decrease her risk of future health problems.  AGREE: Multiple dietary modification options and treatment options were discussed and [name] [name] agreed to follow the recommendations documented in the above note.  ARRANGE: [name] [name] was educated on the importance of frequent visits to treat obesity as outlined per CMS and USPSTF guidelines and agreed to schedule her next follow up appointment today.  Attestation Statements:   Reviewed by clinician on day of visit: allergies, medications, problem list, medical history, surgical history, family history, social history, and previous encounter notes.  I, Water quality scientist, CMA, am acting as transcriptionist for Briscoe Deutscher, DO  I have reviewed the above documentation for accuracy and completeness, and I agree with the above. Briscoe Deutscher, DO

## 2020-09-17 ENCOUNTER — Other Ambulatory Visit: Payer: Self-pay

## 2020-09-17 ENCOUNTER — Encounter (INDEPENDENT_AMBULATORY_CARE_PROVIDER_SITE_OTHER): Payer: Self-pay | Admitting: Adult Health

## 2020-09-17 ENCOUNTER — Ambulatory Visit (INDEPENDENT_AMBULATORY_CARE_PROVIDER_SITE_OTHER): Payer: Medicare Other | Admitting: Adult Health

## 2020-09-17 VITALS — BP 130/63 | HR 76 | Temp 98.4°F | Ht 65.0 in | Wt 393.0 lb

## 2020-09-17 DIAGNOSIS — Z6841 Body Mass Index (BMI) 40.0 and over, adult: Secondary | ICD-10-CM

## 2020-09-17 DIAGNOSIS — G4733 Obstructive sleep apnea (adult) (pediatric): Secondary | ICD-10-CM

## 2020-09-17 DIAGNOSIS — E1169 Type 2 diabetes mellitus with other specified complication: Secondary | ICD-10-CM

## 2020-09-17 DIAGNOSIS — Z930 Tracheostomy status: Secondary | ICD-10-CM | POA: Diagnosis not present

## 2020-09-18 MED ORDER — OZEMPIC (0.25 OR 0.5 MG/DOSE) 2 MG/1.5ML ~~LOC~~ SOPN: PEN_INJECTOR | SUBCUTANEOUS | 0 refills | Status: DC

## 2020-09-18 NOTE — Progress Notes (Signed)
Chief Complaint:   OBESITY [name] [name] is here to discuss her progress with her obesity treatment plan along with follow-up of her obesity related diagnoses. [name] [name] is on keeping a food journal and adhering to recommended goals of 1200 calories and 95 g protein and states she is following her eating plan approximately 20% of the time. [name] [name] states she is exercising 0 minutes 0 times per week.  Today's visit was #: 4 Starting weight: 393 lbs Starting date: 07/01/2020 Today's weight: 393 lbs Today's date: 09/17/2020 Total lbs lost to date: 0 Total lbs lost since last in-office visit: 0  Interim History: She reports stable energy and denies SOBE on supplemental O2. She is on [facility] daytime She is on 4LNC nighttime She estimates to track and hit calorie and protein goals about 20% of the time. Her goal is to loss weight, reduce daytime supplemental O2, and ultimately have tracheostomy de-cannulated.  Pt's sister is getting married Oct 2022 and she would like "to walk down the isle with the oxygen tank".  Subjective:   1. Severe obstructive sleep apnea Followed by pulmonology. She is capping trach at night and use 4LNC for oxygenation.  2. Type 2 diabetes mellitus with other specified complication, without long-term current use of insulin ([facility]) Pt was prescribed Ozempic 0.25 mg on 09/02/2020. Pharmacy has yet to fill Rx and pt was recently notified that GLP-1 is ready to be picked up. 06/04/2020 A1c 5.5.   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 Pt is on supplemental O2, at 2 LNC in daytime and 4 LNC at nighttime. She denies  SOBE.  Assessment/Plan:   1. Severe obstructive sleep apnea Follow instructions of pulmonology for CPAP. 05/28/2020 Pulmonary notes: "OSA (obstructive sleep apnea) Obstructive sleep apnea with OHS. Patient's continue on trach and oxygen. Patient is aware to keep trach on capped at nighttime and use trach collar."  2. Type 2 diabetes mellitus with other specified complication, without long-term current use of insulin (HCC) Good blood sugar control is important to decrease the likelihood of diabetic complications such as nephropathy, neuropathy, limb loss, blindness, coronary artery disease, and death. Intensive lifestyle modification including diet, exercise and weight loss are the first line of treatment for diabetes. Start Ozempic 0.25 once a week for 2 weeks, then increase to 0.5 mg week 3.  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  4. Class 3 severe obesity with serious comorbidity and body mass index (BMI) of 60.0 to 69.9 in adult, unspecified obesity type (HCC) [name] [name] is currently in the action stage of change. As such, her goal is to continue with weight loss efforts. She has agreed to keeping a food journal and adhering to recommended goals of 1200 calories and 95 g protein.   2 liters of nasal cannula in the daytime and 4 liters nasal cannula at nighttime.  Exercise goals: No exercise has been prescribed at this time.  Behavioral modification strategies: increasing lean protein intake, meal planning and cooking strategies, keeping healthy foods in the home, planning for success and keeping a strict food journal.  [name] [name] has agreed to follow-up with our clinic in  2 weeks. She was informed of the importance of frequent follow-up visits to maximize her success with intensive lifestyle modifications for her multiple health conditions.   Objective:   Blood pressure 130/63, pulse 76, temperature 98.4 F (36.9 C), temperature source Oral, height _0   (1.651 m), weight (!) 393 lb (178.3 kg), SpO2 96 %, unknown if currently breastfeeding. Body mass index is 65.4 kg/m.  General: Cooperative, alert, well developed, in no acute distress. HEENT: Conjunctivae and lids unremarkable. Cardiovascular: Regular rhythm.  Lungs: Normal work of breathing. Neurologic: No focal deficits.   Lab Results  Component Value Date   CREATININE 0.78 06/04/2020   BUN 13 06/04/2020   NA 140 06/04/2020   K 3.6 06/04/2020   CL 95 (L) 06/04/2020   CO2 32 (H) 06/04/2020   Lab Results  Component Value Date   ALT 13 06/04/2020   AST 13 06/04/2020   ALKPHOS 111 06/04/2020   BILITOT 0.4 06/04/2020   Lab Results  Component Value Date   HGBA1C 5.5 06/04/2020   HGBA1C 6.2 11/20/2019   HGBA1C 6.2 09/15/2016   HGBA1C 7.9 (H) 05/25/2016   No results found for: INSULIN Lab Results  Component Value Date   TSH 2.970 09/20/2018   Lab Results  Component Value Date   CHOL 232 (H) 06/04/2020   HDL 56 06/04/2020   LDLCALC 156 (H) 06/04/2020   LDLDIRECT 99.0 09/15/2016   TRIG 112 06/04/2020   CHOLHDL 4.1 06/04/2020   Lab Results  Component Value Date   WBC 5.1 03/13/2020   HGB 13.1 03/13/2020   HCT 42.5 03/13/2020   MCV 86.7 03/13/2020   PLT 138.0 (L) 03/13/2020   Lab Results  Component Value Date   IRON 54 09/19/2016   TIBC 227 (L) 09/19/2016    Attestation Statements:   Reviewed by clinician on day of visit: allergies, medications, problem list, medical history, surgical history, family history, social history, and previous encounter notes.  Time spent on visit including pre-visit chart review and post-visit care and charting was 33 minutes.   Coral Ceo, am acting as Location manager for Mina Marble, NP.  I have reviewed the above documentation for accuracy and completeness, and I agree with the above. -  Cortlynn Hollinsworth d. Reniyah Gootee, NP-C

## 2020-09-29 ENCOUNTER — Other Ambulatory Visit (HOSPITAL_COMMUNITY): Payer: Medicare Other

## 2020-10-01 ENCOUNTER — Encounter (INDEPENDENT_AMBULATORY_CARE_PROVIDER_SITE_OTHER): Payer: Self-pay | Admitting: Family Medicine

## 2020-10-01 ENCOUNTER — Telehealth: Payer: Self-pay | Admitting: Adult Health

## 2020-10-01 ENCOUNTER — Other Ambulatory Visit: Payer: Self-pay

## 2020-10-01 ENCOUNTER — Telehealth (INDEPENDENT_AMBULATORY_CARE_PROVIDER_SITE_OTHER): Payer: Medicare Other | Admitting: Family Medicine

## 2020-10-01 ENCOUNTER — Inpatient Hospital Stay (HOSPITAL_COMMUNITY): Admission: RE | Admit: 2020-10-01 | Payer: Medicare Other | Source: Ambulatory Visit

## 2020-10-01 DIAGNOSIS — U071 COVID-19: Secondary | ICD-10-CM | POA: Diagnosis not present

## 2020-10-01 DIAGNOSIS — E8881 Metabolic syndrome: Secondary | ICD-10-CM

## 2020-10-01 DIAGNOSIS — E1169 Type 2 diabetes mellitus with other specified complication: Secondary | ICD-10-CM

## 2020-10-01 DIAGNOSIS — Z930 Tracheostomy status: Secondary | ICD-10-CM

## 2020-10-01 DIAGNOSIS — Z6841 Body Mass Index (BMI) 40.0 and over, adult: Secondary | ICD-10-CM

## 2020-10-01 MED ORDER — OZEMPIC (0.25 OR 0.5 MG/DOSE) 2 MG/1.5ML ~~LOC~~ SOPN: 0.5000 mg | PEN_INJECTOR | SUBCUTANEOUS | 0 refills | Status: DC

## 2020-10-01 NOTE — Telephone Encounter (Signed)
The last sleep study that we have has been sent to [facility] with [facility].

## 2020-10-06 NOTE — Progress Notes (Signed)
TeleHealth Visit:  Due to the COVID-19 pandemic, this visit was completed with telemedicine (audio/video) technology to reduce patient and provider exposure as well as to preserve personal protective equipment.   Josslynn has verbally consented to this TeleHealth visit. The patient is located at home, the provider is located at the [name] office. The participants in this visit include the listed provider and patient and. The visit was conducted today via [place].  OBESITY Marcene is here to discuss her progress with her obesity treatment plan along with follow-up of her obesity related diagnoses.   Today's visit was #: 5 Starting weight: 393 lbs Starting date: 07/01/2020 Today's date: 10/01/2020  Interim History: Kc has been on Ozempic for 2 weeks.  She says it is helping with snacking.  Denies side effects.   Nutrition Plan: keeping a food journal and adhering to recommended goals of 1200 calories and 95 grams of protein for 50% of the time. Anti-obesity medications: Ozempic. Reported side effects: None. Activity: She says she is doing more walking.  Assessment/Plan:   1. Type 2 diabetes mellitus with other specified complication, without long-term current use of insulin (HCC) Diabetes Mellitus: At goal. Medication: Ozempic 0.5 mg subcutaneously weekly. Issues reviewed: blood sugar goals, complications of diabetes mellitus, hypoglycemia prevention and treatment, exercise, and nutrition.   Plan:  Will refill Ozempic today, as per below.  The importance of regular follow up with PCP and all other specialists as scheduled was stressed to patient today.  Lab Results  Component Value Date   HGBA1C 5.5 06/04/2020   HGBA1C 6.2 11/20/2019   HGBA1C 6.2 09/15/2016   Lab Results  Component Value Date   MICROALBUR <0.7 11/20/2019   LDLCALC 156 (H) 06/04/2020   CREATININE 0.78 06/04/2020   - Refill Semaglutide,0.25 or 0.5MG/DOS, (OZEMPIC, 0.25 OR 0.5 MG/DOSE,) 2  MG/1.5ML SOPN; Inject 0.5 mg into the skin once a week.  Dispense: 1.5 mL; Refill: 0  2. COVID No red flags.  Discussed letting Pulmonology know that she is positive in case treatment is warranted.  Precautions and red flags were reviewed.  We will continue to monitor.   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 She is on supplemental O2, at 2 LNC in the daytime and 4 LNC at nighttime. She denies SOB.  4. Metabolic syndrome Starting goal: Lose 7-10% of starting weight. She will continue to focus on protein-rich, low simple carbohydrate foods. We reviewed the importance of hydration, regular exercise for stress reduction, and restorative sleep.  We will continue to check lab work every 3 months, with 10% weight loss, or should any other concerns arise.  5. Class 3 severe obesity with serious comorbidity and body mass index (BMI) of 60.0 to 69.9 in adult, unspecified obesity type [facility])  Course: Raelan is currently in the action stage of change. As such, her goal is to continue with weight loss efforts.   Nutrition goals: She has agreed to keeping a food journal and adhering to recommended goals of 1200 calories and 95 grams of protein.   Exercise goals: As tolerated.  Behavioral modification strategies: increasing lean protein intake, decreasing simple carbohydrates, increasing vegetables, increasing water intake and decreasing liquid calories.  Glora has agreed to follow-up with our clinic in 2-3 weeks. She was informed of the importance of frequent follow-up visits to maximize her success with intensive lifestyle modifications for her multiple health conditions.   Objective:   unknown  if currently breastfeeding. There is no height or weight on file to calculate BMI.  General: Cooperative, alert, well developed, in no acute distress. HEENT: Conjunctivae and lids unremarkable. Cardiovascular: Regular rhythm.   Lungs: Normal work of breathing. Neurologic: No focal deficits.   Lab Results  Component Value Date   CREATININE 0.78 06/04/2020   BUN 13 06/04/2020   NA 140 06/04/2020   K 3.6 06/04/2020   CL 95 (L) 06/04/2020   CO2 32 (H) 06/04/2020   Lab Results  Component Value Date   ALT 13 06/04/2020   AST 13 06/04/2020   ALKPHOS 111 06/04/2020   BILITOT 0.4 06/04/2020   Lab Results  Component Value Date   HGBA1C 5.5 06/04/2020   HGBA1C 6.2 11/20/2019   HGBA1C 6.2 09/15/2016   HGBA1C 7.9 (H) 05/25/2016   Lab Results  Component Value Date   TSH 2.970 09/20/2018   Lab Results  Component Value Date   CHOL 232 (H) 06/04/2020   HDL 56 06/04/2020   LDLCALC 156 (H) 06/04/2020   LDLDIRECT 99.0 09/15/2016   TRIG 112 06/04/2020   CHOLHDL 4.1 06/04/2020   Lab Results  Component Value Date   WBC 5.1 03/13/2020   HGB 13.1 03/13/2020   HCT 42.5 03/13/2020   MCV 86.7 03/13/2020   PLT 138.0 (L) 03/13/2020   Lab Results  Component Value Date   IRON 54 09/19/2016   TIBC 227 (L) 09/19/2016   Attestation Statements:   Reviewed by clinician on day of visit: allergies, medications, problem list, medical history, surgical history, family history, social history, and previous encounter notes.  I, Water quality scientist, CMA, am acting as transcriptionist for Briscoe Deutscher, DO  I have reviewed the above documentation for accuracy and completeness, and I agree with the above. Briscoe Deutscher, DO

## 2020-10-22 ENCOUNTER — Ambulatory Visit (HOSPITAL_COMMUNITY)
Admission: RE | Admit: 2020-10-22 | Discharge: 2020-10-22 | Disposition: A | Discharge status: Home / Self Care | Payer: Medicare Other | Source: Ambulatory Visit | Attending: Acute Care | Admitting: Acute Care

## 2020-10-22 ENCOUNTER — Ambulatory Visit (INDEPENDENT_AMBULATORY_CARE_PROVIDER_SITE_OTHER): Payer: Medicare Other | Admitting: Adult Health

## 2020-10-22 ENCOUNTER — Other Ambulatory Visit: Payer: Self-pay

## 2020-10-22 ENCOUNTER — Encounter (INDEPENDENT_AMBULATORY_CARE_PROVIDER_SITE_OTHER): Payer: Self-pay | Admitting: Adult Health

## 2020-10-22 VITALS — BP 135/65 | HR 89 | Temp 97.7°F | Ht 65.0 in | Wt 395.0 lb

## 2020-10-22 DIAGNOSIS — G4733 Obstructive sleep apnea (adult) (pediatric): Secondary | ICD-10-CM | POA: Insufficient documentation

## 2020-10-22 DIAGNOSIS — Z430 Encounter for attention to tracheostomy: Secondary | ICD-10-CM | POA: Insufficient documentation

## 2020-10-22 DIAGNOSIS — Z6841 Body Mass Index (BMI) 40.0 and over, adult: Secondary | ICD-10-CM | POA: Diagnosis not present

## 2020-10-22 DIAGNOSIS — Z930 Tracheostomy status: Secondary | ICD-10-CM

## 2020-10-22 DIAGNOSIS — I5032 Chronic diastolic (congestive) heart failure: Secondary | ICD-10-CM | POA: Insufficient documentation

## 2020-10-22 DIAGNOSIS — I5030 Unspecified diastolic (congestive) heart failure: Secondary | ICD-10-CM | POA: Diagnosis not present

## 2020-10-22 DIAGNOSIS — I2729 Other secondary pulmonary hypertension: Secondary | ICD-10-CM | POA: Insufficient documentation

## 2020-10-22 DIAGNOSIS — E66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: Secondary | ICD-10-CM | POA: Diagnosis not present

## 2020-10-22 DIAGNOSIS — E1169 Type 2 diabetes mellitus with other specified complication: Secondary | ICD-10-CM | POA: Diagnosis not present

## 2020-10-22 NOTE — Progress Notes (Signed)
Tracheostomy Procedure Note  Shawan Tosh [id number] 1980/12/27  Pre Procedure Tracheostomy Information  Trach Brand: Shiley Size: 6.0 flex  #[id number] Style: Uncuffed Secured by: Velcro   Procedure: Trach change and trach cleaning    Post Procedure Tracheostomy Information  Trach Brand: Shiley Size: 6.0  Flex  [id number] Style: Uncuffed Secured by: Velcro   Post Procedure Evaluation:  ETCO2 positive color change from yellow to purple : Yes.   Vital signs:VSS Patients current condition: stable Complications: No apparent complications Trach site exam: clean and dry Wound care done: 4 x 4 gauze drain gauze Patient did tolerate procedure well.   Education: none  Prescription needs: none    Additional needs: Patient given a new PMV at this visit

## 2020-10-22 NOTE — Progress Notes (Signed)
Reason for visit: Tracheostomy change  HPI 40 year old female well-known to me has history of OHS/OSA, diastolic heart failure secondary pulmonary hypertension and tracheostomy dependence.  I follow her for tracheostomy management.  Last seen in the office mid/end December.  Presents today for planned tracheostomy change  Review of systems General no fevers sick exposures overall feeling well HEENT no headache, sore throat, tracheal discharge, sinus discomfort or change in phonation pulmonary: No new shortness of breath cough wheeze chest discomfort cardiac: No chest pain or palpitations no new orthopnea no lower extremity swelling abdomen: No nausea vomiting diarrhea GU: Skipped neuro: No lightheadedness no gait disturbance no memory changes no headaches no focal deficits  Physical exam General 40 year old female patient resting in the chair no acute distress HEENT normocephalic atraumatic size 6 cuffless tracheostomy is unremarkable tracheostomy stoma is matured and has no drainage Pulmonary: Clear to auscultation diminished bases she is on nasal cannula support.  Able to phonate well, has somewhat raspy voice but this is her baseline.  She is able to tolerate both PMV and trach occlusion she does have a mild air rush when tracheostomy removed Cardiac regular rate and rhythm Abdomen is soft nontender Extremities are warm and dry Neuro intact  Procedure The size 6 tracheostomy was removed the site/stoma was inspected and found to be well matured and without concern a new size 6 tracheostomy was placed, correct placement confirmed with end-tidal CO2 patient tolerated well  Impression/plan Tracheostomy dependence Obesity hypoventilation syndrome and obstructive sleep apnea Diastolic heart failure/chronic Obesity  Discussion Gal is doing well in regards to her tracheostomy.  I am delighted to hear she is now working with a weight loss program.  We actually spent a fair amount of time  discussing healthy strategies for weight loss including goalsetting, staying positive, focusing on longer term goals.  We also discussed the possibility of potentially having gastric bypass I also think that is a possibility.  I suspect she would need to lose about 100 pounds however before this would be a safe endeavor to consider.  I would also encourage her to go through the appropriate counseling both dietary and psychological prior to this as it is a large commitment.  From a tracheostomy standpoint I have no plans to decannulate her at this time however if she were to be successful with the weight loss, and then could undergo a repeat sleep study and then tolerate CPAP I think we could work towards decannulation at that point.  I told her I think were at least a year away from that even in the best case scenario  Plan Continue routine trach care We will see her in 8 weeks for planned tracheostomy change  My x36 minutes  Erick Colace ACNP-BC [place] Pager # [phone] OR # [phone] if no answer

## 2020-10-23 NOTE — Progress Notes (Signed)
Chief Complaint:   OBESITY [name] [name] is here to discuss her progress with her obesity treatment plan along with follow-up of her obesity related diagnoses. [name] [name] is on keeping a food journal and adhering to recommended goals of 1200 calories and 95 g protein and states she is following her eating plan approximately 30-40% of the time. [name] [name] states she is doing 0 minutes 0 times per week.  Today's visit was #: 6 Starting weight: 393 lbs Starting date: 07/01/2020 Today's weight: 395 lbs Today's date: 10/22/2020 Total lbs lost to date: 0 Total lbs lost since last in-office visit: 0  Interim History: [name] [name] was started on Ozempic 0.25 mg at the end of January 2022. She increased her dose to 0.5 mg on 10/01/2020 (4 weeks ago). She has been having a fruit or 100 calorie snack instead of consuming lunch due to decreased appetite.   Subjective:   1. Type 2 diabetes mellitus with other specified complication, without long-term current use of insulin (HCC) [name] [name] had been diet controlled. She started Ozempic in January 2022. She has titrated up to 0.5 mg once a week. She denies medication side effects. She reports poor appetite on current dose.  2. Heart failure with preserved ejection fraction, unspecified HF chronicity ([place]) [name] [name] is on multiple cardiac medications and oxygen therapy. She denies SOBE.  Assessment/Plan:   1. Type 2 diabetes mellitus with other specified complication, without long-term current use of insulin (HCC) Good blood sugar control is important to decrease the likelihood of diabetic complications such as nephropathy, neuropathy, limb loss, blindness, coronary artery disease, and death. Intensive lifestyle modification including diet, exercise and weight loss are the first line of treatment for diabetes. Continue Ozempic 0.5 mg weekly and increase protein intake.  2. Heart failure with preserved ejection fraction, unspecified HF chronicity ([place]) Continue current  prescription therapy and follow up with cardiology as directed.  3. Class 3 severe obesity with serious comorbidity and body mass index (BMI) of 60.0 to 69.9 in adult, unspecified obesity type (HCC) [name] [name] is currently in the action stage of change. As such, her goal is to continue with weight loss efforts. She has agreed to keeping a food journal and adhering to recommended goals of 1200 calories and 95 g protein.   Handout: High Protein/Low Calorie Food List & Protein Count Food List  Exercise goals: No exercise has been prescribed at this time.  Behavioral modification strategies: increasing lean protein intake, decreasing simple carbohydrates, no skipping meals, meal planning and cooking strategies, better snacking choices, planning for success and keeping a strict food journal.  [name] [name] has agreed to follow-up with our clinic in 2 weeks. She was informed of the importance of frequent follow-up visits to maximize her success with intensive lifestyle modifications for her multiple health conditions.   Objective:   Blood pressure 135/65, pulse 89, temperature 97.7 F (36.5 C), height _0  (1.651 m), weight (!) 395 lb (179.2 kg), SpO2 96 %, unknown if currently breastfeeding. Body mass index is 65.73 kg/m.  General: Cooperative, alert, well developed, in no acute distress. HEENT: Conjunctivae and lids unremarkable. Cardiovascular: Regular rhythm.  Lungs: Normal work of breathing. Neurologic: No focal deficits.   Lab Results  Component Value Date   CREATININE 0.78 06/04/2020   BUN 13 06/04/2020   NA 140 06/04/2020   K 3.6 06/04/2020   CL 95 (L) 06/04/2020   CO2 32 (H) 06/04/2020   Lab Results  Component Value Date   ALT 13 06/04/2020  AST 13 06/04/2020   ALKPHOS 111 06/04/2020   BILITOT 0.4 06/04/2020   Lab Results  Component Value Date   HGBA1C 5.5 06/04/2020   HGBA1C 6.2 11/20/2019   HGBA1C 6.2 09/15/2016   HGBA1C 7.9 (H) 05/25/2016   No results found for:  INSULIN Lab Results  Component Value Date   TSH 2.970 09/20/2018   Lab Results  Component Value Date   CHOL 232 (H) 06/04/2020   HDL 56 06/04/2020   LDLCALC 156 (H) 06/04/2020   LDLDIRECT 99.0 09/15/2016   TRIG 112 06/04/2020   CHOLHDL 4.1 06/04/2020   Lab Results  Component Value Date   WBC 5.1 03/13/2020   HGB 13.1 03/13/2020   HCT 42.5 03/13/2020   MCV 86.7 03/13/2020   PLT 138.0 (L) 03/13/2020   Lab Results  Component Value Date   IRON 54 09/19/2016   TIBC 227 (L) 09/19/2016    Attestation Statements:   Reviewed by clinician on day of visit: allergies, medications, problem list, medical history, surgical history, family history, social history, and previous encounter notes.  Time spent on visit including pre-visit chart review and post-visit care and charting was 33 minutes.   Coral Ceo, am acting as Location manager for Mina Marble, NP.  I have reviewed the above documentation for accuracy and completeness, and I agree with the above. - Tallin Hart d. Marcin Holte, NP-C

## 2020-11-05 ENCOUNTER — Ambulatory Visit (INDEPENDENT_AMBULATORY_CARE_PROVIDER_SITE_OTHER): Payer: Self-pay | Admitting: Family Medicine

## 2020-11-19 ENCOUNTER — Other Ambulatory Visit: Payer: Self-pay

## 2020-11-19 ENCOUNTER — Ambulatory Visit (INDEPENDENT_AMBULATORY_CARE_PROVIDER_SITE_OTHER): Payer: Medicare Other | Admitting: Adult Health

## 2020-11-19 ENCOUNTER — Encounter (INDEPENDENT_AMBULATORY_CARE_PROVIDER_SITE_OTHER): Payer: Self-pay | Admitting: Adult Health

## 2020-11-19 VITALS — BP 129/68 | HR 75 | Temp 97.9°F | Ht 65.0 in | Wt 390.0 lb

## 2020-11-19 DIAGNOSIS — I152 Hypertension secondary to endocrine disorders: Secondary | ICD-10-CM

## 2020-11-19 DIAGNOSIS — E1169 Type 2 diabetes mellitus with other specified complication: Secondary | ICD-10-CM

## 2020-11-19 DIAGNOSIS — Z6841 Body Mass Index (BMI) 40.0 and over, adult: Secondary | ICD-10-CM

## 2020-11-19 DIAGNOSIS — E1159 Type 2 diabetes mellitus with other circulatory complications: Secondary | ICD-10-CM

## 2020-11-19 DIAGNOSIS — E66813 Obesity, class 3: Secondary | ICD-10-CM

## 2020-11-19 MED ORDER — OZEMPIC (0.25 OR 0.5 MG/DOSE) 2 MG/1.5ML ~~LOC~~ SOPN: 0.5000 mg | PEN_INJECTOR | SUBCUTANEOUS | 0 refills | Status: DC

## 2020-11-24 NOTE — Progress Notes (Signed)
Chief Complaint:   OBESITY [name] [name] is here to discuss her progress with her obesity treatment plan along with follow-up of her obesity related diagnoses. [name] [name] is on keeping a food journal and adhering to recommended goals of 1200 calories and 95 g protein and states she is following her eating plan approximately 70% of the time. [name] [name] states she is walking 20-25 minutes 3-4 times per week.  Today's visit was #: 7 Starting weight: 393 lbs Starting date: 07/01/2020 Today's weight: 390 lbs Today's date: 11/19/2020 Total lbs lost to date: 3 lbs Total lbs lost since last in-office visit: 5 lbs  Interim History: [name] [name] is tracking intake at least 70% of the time via mental calculation. She has been using food scale to ensure exact oz of protein ant lunch and dinner.   Subjective:   1. Type 2 diabetes mellitus with other specified complication, without long-term current use of insulin (HCC) [name] [name] has been on Ozempic 0.25 mg and reports polyphagia. She was instructed to increase to 0.5 mg at last OV. She understands to increase to 0.5 mg once a week.   Lab Results  Component Value Date   HGBA1C 5.5 06/04/2020   HGBA1C 6.2 11/20/2019   HGBA1C 6.2 09/15/2016   Lab Results  Component Value Date   MICROALBUR <0.7 11/20/2019   LDLCALC 156 (H) 06/04/2020   CREATININE 0.78 06/04/2020   No results found for: INSULIN  2. Hypertension associated with type 2 diabetes mellitus ([place]) [name] [name]'s BP/HR are excellent at OV. She is on Coreg 25 mg BID, Lasix 40 mg QD, and Norvasc 5 mg QD.   BP Readings from Last 3 Encounters:  11/19/20 129/68  10/22/20 135/65  09/17/20 130/63    Assessment/Plan:   1. Type 2 diabetes mellitus with other specified complication, without long-term current use of insulin (HCC) Good blood sugar control is important to decrease the likelihood of diabetic complications such as nephropathy, neuropathy, limb loss, blindness, coronary artery disease, and death.  Intensive lifestyle modification including diet, exercise and weight loss are the first line of treatment for diabetes.   - Semaglutide,0.25 or 0.5MG/DOS, (OZEMPIC, 0.25 OR 0.5 MG/DOSE,) 2 MG/1.5ML SOPN; Inject 0.5 mg into the skin once a week.  Dispense: 1.5 mL; Refill: 0  2. Hypertension associated with type 2 diabetes mellitus ([place]) [name] [name] is working on healthy weight loss and exercise to improve blood pressure control. We will watch for signs of hypotension as she continues her lifestyle modifications. Continue follow up with cardiology as directed.  3. Class 3 severe obesity with serious comorbidity and body mass index (BMI) of 60.0 to 69.9 in adult, unspecified obesity type (HCC) [name] [name] is currently in the action stage of change. As such, her goal is to continue with weight loss efforts. She has agreed to keeping a food journal and adhering to recommended goals of 1200 calories and 95 g protein.   Use My Fitness pal to track intake.  Exercise goals: As is  Behavioral modification strategies: increasing lean protein intake, meal planning and cooking strategies, planning for success and keeping a strict food journal.  [name] [name] has agreed to follow-up with our clinic in 2 weeks. She was informed of the importance of frequent follow-up visits to maximize her success with intensive lifestyle modifications for her multiple health conditions.   Objective:   Blood pressure 129/68, pulse 75, temperature 97.9 F (36.6 C), height _0  (1.651 m), weight (!) 390 lb (176.9 kg), SpO2 96 %, unknown if currently breastfeeding.  Body mass index is 64.9 kg/m.  General: Cooperative, alert, well developed, in no acute distress. HEENT: Conjunctivae and lids unremarkable. Cardiovascular: Regular rhythm.  Lungs: Normal work of breathing. Neurologic: No focal deficits.   Lab Results  Component Value Date   CREATININE 0.78 06/04/2020   BUN 13 06/04/2020   NA 140 06/04/2020   K 3.6 06/04/2020   CL  95 (L) 06/04/2020   CO2 32 (H) 06/04/2020   Lab Results  Component Value Date   ALT 13 06/04/2020   AST 13 06/04/2020   ALKPHOS 111 06/04/2020   BILITOT 0.4 06/04/2020   Lab Results  Component Value Date   HGBA1C 5.5 06/04/2020   HGBA1C 6.2 11/20/2019   HGBA1C 6.2 09/15/2016   HGBA1C 7.9 (H) 05/25/2016   No results found for: INSULIN Lab Results  Component Value Date   TSH 2.970 09/20/2018   Lab Results  Component Value Date   CHOL 232 (H) 06/04/2020   HDL 56 06/04/2020   LDLCALC 156 (H) 06/04/2020   LDLDIRECT 99.0 09/15/2016   TRIG 112 06/04/2020   CHOLHDL 4.1 06/04/2020   Lab Results  Component Value Date   WBC 5.1 03/13/2020   HGB 13.1 03/13/2020   HCT 42.5 03/13/2020   MCV 86.7 03/13/2020   PLT 138.0 (L) 03/13/2020   Lab Results  Component Value Date   IRON 54 09/19/2016   TIBC 227 (L) 09/19/2016     Attestation Statements:   Reviewed by clinician on day of visit: allergies, medications, problem list, medical history, surgical history, family history, social history, and previous encounter notes.  Time spent on visit including pre-visit chart review and post-visit care and charting was 32 minutes.   Coral Ceo, am acting as Location manager for Mina Marble, NP.  I have reviewed the above documentation for accuracy and completeness, and I agree with the above. -  Josaiah Muhammed d. Layani Foronda, NP-C

## 2020-11-25 ENCOUNTER — Other Ambulatory Visit: Payer: Self-pay

## 2020-11-26 ENCOUNTER — Ambulatory Visit (INDEPENDENT_AMBULATORY_CARE_PROVIDER_SITE_OTHER): Payer: Medicare Other | Admitting: Nurse Practitioner

## 2020-11-26 ENCOUNTER — Encounter: Payer: Self-pay | Admitting: Nurse Practitioner

## 2020-11-26 VITALS — BP 136/80 | HR 85 | Temp 97.5°F | Ht 63.5 in | Wt 393.6 lb

## 2020-11-26 DIAGNOSIS — Z0001 Encounter for general adult medical examination with abnormal findings: Secondary | ICD-10-CM

## 2020-11-26 DIAGNOSIS — Z23 Encounter for immunization: Secondary | ICD-10-CM | POA: Diagnosis not present

## 2020-11-26 DIAGNOSIS — Z6841 Body Mass Index (BMI) 40.0 and over, adult: Secondary | ICD-10-CM | POA: Diagnosis not present

## 2020-11-26 DIAGNOSIS — E1169 Type 2 diabetes mellitus with other specified complication: Secondary | ICD-10-CM | POA: Diagnosis not present

## 2020-11-26 DIAGNOSIS — F3342 Major depressive disorder, recurrent, in full remission: Secondary | ICD-10-CM | POA: Diagnosis not present

## 2020-11-26 DIAGNOSIS — E782 Mixed hyperlipidemia: Secondary | ICD-10-CM | POA: Diagnosis not present

## 2020-11-26 LAB — COMPREHENSIVE METABOLIC PANEL
ALT: 14 U/L (ref 0–35)
AST: 13 U/L (ref 0–37)
Albumin: 3.8 g/dL (ref 3.5–5.2)
Alkaline Phosphatase: 103 U/L (ref 39–117)
BUN: 19 mg/dL (ref 6–23)
CO2: 32 mEq/L (ref 19–32)
Calcium: 9 mg/dL (ref 8.4–10.5)
Chloride: 99 mEq/L (ref 96–112)
Creatinine, Ser: 0.85 mg/dL (ref 0.40–1.20)
GFR: 85.14 mL/min (ref >60.00)
Glucose, Bld: 106 mg/dL — ABNORMAL HIGH (ref 70–99)
Potassium: 3.5 mEq/L (ref 3.5–5.1)
Sodium: 139 mEq/L (ref 135–145)
Total Bilirubin: 0.6 mg/dL (ref 0.2–1.2)
Total Protein: 7.4 g/dL (ref 6.0–8.3)

## 2020-11-26 LAB — LIPID PANEL
Cholesterol: 171 mg/dL (ref 0–200)
HDL: 43.4 mg/dL (ref >39.00)
LDL Cholesterol: 105 mg/dL — ABNORMAL HIGH (ref 0–99)
NonHDL: 127.35
Total CHOL/HDL Ratio: 4
Triglycerides: 110 mg/dL (ref 0.0–149.0)
VLDL: 22 mg/dL (ref 0.0–40.0)

## 2020-11-26 LAB — TSH: TSH: 1.97 u[IU]/mL (ref 0.35–4.50)

## 2020-11-26 LAB — MICROALBUMIN / CREATININE URINE RATIO
Creatinine,U: 85.3 mg/dL
Microalb Creat Ratio: 2.9 mg/g (ref 0.0–30.0)
Microalb, Ur: 2.5 mg/dL — ABNORMAL HIGH (ref 0.0–1.9)

## 2020-11-26 LAB — HEMOGLOBIN A1C: Hgb A1c MFr Bld: 5.9 % (ref 4.6–6.5)

## 2020-11-26 MED ORDER — CITALOPRAM HYDROBROMIDE 10 MG PO TABS
10.0000 mg | ORAL_TABLET | Freq: Every day | ORAL | 3 refills | Status: DC
Start: 2020-11-26 — End: 2021-11-17

## 2020-11-26 NOTE — Progress Notes (Signed)
Subjective:    Patient ID: [name] [name], female    DOB: 02-Jun-1981, 40 y.o.   MRN: [id number]  Patient presents today for CPE and eval of chronic conditions  HPI Class 3 severe obesity with serious comorbidity and body mass index (BMI) of 60.0 to 69.9 in adult [facility]) Followed by health and wellness clinic  Depression, major, single episode, complete remission ([place]) Improved and stable mood with celexa Refill sent  Diabetes mellitus ([place]) Controlled with ozempic, managed by weight management clinic Repeat hgbA1c, urine microalbumin, lipid panel, and CMP. F/up in 2month  Sexual History (orientation,birth control, marital status, STD):  Dep[facility]Suicide[facility] 11/26/2020 07/01/2020 03/13/2020 03/13/2020 03/13/2020 08/21/2019 09/24/2016  Decreased Interest 0 2 0 3 0 0 0  Down, Depressed, Hopeless 0 2 0 0 0 0 0  PHQ - 2 Score 0 4 0 3 0 0 0  Altered sleeping - 1 - 0 - - -  Tired, decreased energy - 1 - 0 - - -  Change in appetite - 1 - 0 - - -  Feeling bad or failure about yourself  - 1 - 0 - - -  Trouble concentrating - 2 - 0 - - -  Moving slowly or fidgety/restless - 0 - 0 - - -  Suicidal thoughts - 0 - 0 - - -  PHQ-9 Score - 10 - 3 - - -  Difficult doing work/chores - Somewhat difficult - - - - -  Some recent data might be hidden   GAD 7 : Generalized Anxiety Score 11/26/2020 03/13/2020 09/24/2016  Nervous, Anxious, on Edge 0 0 0  Control/stop worrying 0 0 0  Worry too much - different things 0 0 0  Trouble relaxing 0 0 0  Restless 0 0 0  Easily annoyed or irritable 0 0 0  Afraid - awful might happen 0 0 0  Total GAD 7 Score 0 0 0   Vision:will schedule  Dental:will schedule  Immunizations: (TDAP, Hep C screen, Pneumovax, Influenza, zoster)  Health Maintenance  Topic Date Due  . Tetanus Vaccine  Never done  . Pap Smear  Never done  . Eye exam for diabetics  10/28/2017  . COVID-19 Vaccine (3 - Booster for Pfizer series) 06/19/2020  . Complete  foot exam   11/19/2020  .  Hepatitis C: One time screening is recommended by Center for Disease Control  (CDC) for  adults born from [birth year]through 1967.   11/26/2021*  . Hemoglobin A1C  12/03/2020  . Flu Shot  03/23/2021  . Pneumococcal vaccine  Completed  . HIV Screening  Completed  . HPV Vaccine  Aged Out  *Topic was postponed. The date shown is not the original due date.   Diet:followed by weight management clinic Exercise: walking Weight:  Wt Readings from Last 3 Encounters:  11/26/20 (!) 393 lb 9.6 oz (178.5 kg)  11/19/20 (!) 390 lb (176.9 kg)  10/22/20 (!) 395 lb (179.2 kg)   Fall Risk: Fall Risk  11/26/2020 03/13/2020 03/13/2020 08/21/2019 08/18/2016 08/18/2016 07/12/2016  Falls in the past year? 0 0 0 0 No No No  Number falls in past yr: 0 0 0 - - - -  Injury with Fall? 0 0 0 - - - -  Risk for fall due to : No Fall Risks - - - - - -  Follow up Falls evaluation completed Falls prevention discussed - - - - -   Medications and allergies reviewed with patient and updated  if appropriate.  Patient Active Problem List   Diagnosis Date Noted  . Morbid obesity due to excess calories ([place])   . Mixed hyperlipidemia 05/28/2020  . Venous stasis dermatitis of left lower extremity 03/13/2020  . Dependence on supplemental oxygen 03/11/2020  . Cellulitis 02/18/2020  . Acute pulmonary embolism ([place]) 08/07/2019  . Hypoxemia 08/07/2019  . Tracheostomy care ([place])   . Heart failure with preserved ejection fraction ([place])   . Hypertension associated with type 2 diabetes mellitus ([place])   . Peripheral edema   . Tracheostomy dependence ([place])   . Purulent bronchitis ([place])   . Pneumonitis 09/15/2016  . Dysphagia   . Urinary retention   . Hypoalbuminemia due to protein-calorie malnutrition ([place])   . Debilitated 06/29/2016  . Demand ischemia ([place])   . Depression, major, single episode, complete remission ([place])   . Diabetes mellitus ([place])   . Tracheostomy status ([place])   . Chronic diastolic  heart failure ([place])   . Hypoventilation associated with obesity syndrome ([place])   . Chronic respiratory failure with hypoxia and hypercapnia (HCC)   . Sinus pause 06/07/2016  . Difficult intravenous access   . Hepatic congestion 05/26/2016  . Sinus tachycardia 05/26/2016  . Pericardial effusion 05/26/2016  . Pulmonary hypertension ([place])   . Abnormal EKG 05/25/2016  . Hypertensive heart disease with CHF (congestive heart failure) ([place]) 05/25/2016  . OSA (obstructive sleep apnea) 07/09/2011  . Class 3 severe obesity with serious comorbidity and body mass index (BMI) of 60.0 to 69.9 in adult [facility]) 05/18/2011    Current Outpatient Medications on File Prior to Visit  Medication Sig Dispense Refill  . amLODipine (NORVASC) 5 MG tablet Take 1 tablet (5 mg total) by mouth daily. 90 tablet 2  . atorvastatin (LIPITOR) 10 MG tablet Take 1 tablet (10 mg total) by mouth daily. 90 tablet 3  . carvedilol (COREG) 25 MG tablet Take 1 tablet (25 mg total) by mouth 2 (two) times daily. 180 tablet 3  . furosemide (LASIX) 40 MG tablet TAKE 1 TABLET BY MOUTH EVERY DAY 90 tablet 3  . hydrALAZINE (APRESOLINE) 100 MG tablet TAKE 1 TABLET BY MOUTH THREE TIMES A DAY 270 tablet 3  . isosorbide dinitrate (ISORDIL) 30 MG tablet TAKE 1 TABLET BY MOUTH THREE TIMES A DAY 270 tablet 3  . Semaglutide,0.25 or 0.5MG/DOS, (OZEMPIC, 0.25 OR 0.5 MG/DOSE,) 2 MG/1.5ML SOPN Inject 0.5 mg into the skin once a week. 1.5 mL 0   No current facility-administered medications on file prior to visit.    Past Medical History:  Diagnosis Date  . Acute on chronic respiratory failure with hypoxia ([place]) 02/18/2020  . Chronic diastolic heart failure ([place])    Echo 10/12: EF 50-55 // b. Echo 10/17: EF 50-55, mild BAE, PASP 42 // Echo 10/18: Mild concentric LVH, EF 55-60, normal wall motion, mild MAC, severe LAE, mild TR, PASP 15, trivial pericardial effusion  . CKD (chronic kidney disease) stage 4, GFR 15-29 ml/min (HCC) 10/25/2016   admitted  in 2/18 with SCr of 10 in the setting of dehydration from illness, etc >> improved to 5 at DC  . CKD (chronic kidney disease) stage 4, GFR 15-29 ml/min (HCC) 10/25/2016  . DM2 (diabetes mellitus, type 2) (HCC)    A1c 7.2 in 05/2016  . Gestational diabetes mellitus in pregnancy 05/24/2013  . HCAP (healthcare-associated pneumonia)   . HLD (hyperlipidemia)   . HTN (hypertension)   . HTN in pregnancy, chronic 05/24/2013  . Hypertensive heart disease with  CHF (congestive heart failure) ([place]) 05/25/2016  . Lower extremity edema   . Morbid obesity ([place])   . OSA (obstructive sleep apnea) 07/09/2011   s/p Trach 2/2 prolonged respiratory failure during admx for CHF in 05/2016  . Prediabetes   . Pulmonary hypertension ([place])    secondary from CHF/OSA  . Sinus tachycardia   . SOB (shortness of breath)     Past Surgical History:  Procedure Laterality Date  . TRACHEOSTOMY TUBE PLACEMENT N/A 06/11/2016   Procedure: TRACHEOSTOMY;  Surgeon: Melida Quitter, MD;  Location: [facility];  Service: ENT;  Laterality: N/A;    Social History   Socioeconomic History  . Marital status: Single    Spouse name: Not on file  . Number of children: Not on file  . Years of education: Not on file  . Highest education level: Not on file  Occupational History  . Occupation: disability   Tobacco Use  . Smoking status: Never Smoker  . Smokeless tobacco: Never Used  Vaping Use  . Vaping Use: Never used  Substance and Sexual Activity  . Alcohol use: No  . Drug use: No  . Sexual activity: Not Currently    Birth control/protection: I.U.D.  Other Topics Concern  . Not on file  Social History Narrative   LIVES ALONE   WORKS AT [employer]   DENIES ANY TOBACCO OR ETOH USE         Social Determinants of Health   Financial Resource Strain: Low Risk   . Difficulty of Paying Living Expenses: Not hard at all  Food Insecurity: No Food Insecurity  . Worried About Charity fundraiser in the Last Year: Never  true  . Ran Out of Food in the Last Year: Never true  Transportation Needs: No Transportation Needs  . Lack of Transportation (Medical): No  . Lack of Transportation (Non-Medical): No  Physical Activity: Inactive  . Days of Exercise per Week: 0 days  . Minutes of Exercise per Session: 0 min  Stress: No Stress Concern Present  . Feeling of Stress : Not at all  Social Connections: Moderately Integrated  . Frequency of Communication with Friends and Family: More than three times a week  . Frequency of Social Gatherings with Friends and Family: More than three times a week  . Attends Religious Services: More than 4 times per year  . Active Member of Clubs or Organizations: Yes  . Attends Archivist Meetings: More than 4 times per year  . Marital Status: Never married    Family History  Problem Relation Age of Onset  . Hypertension Mother   . Hyperlipidemia Mother   . Diabetes Mother   . Obesity Mother   . Diabetes Other   . Birth defects Paternal Grandfather        unknown type       Review of Systems  Constitutional: Negative for fever, malaise/fatigue and weight loss.  HENT: Negative for congestion and sore throat.   Eyes:       Negative for visual changes  Respiratory: Negative for cough and shortness of breath.   Cardiovascular: Negative for chest pain, palpitations and leg swelling.  Gastrointestinal: Negative for blood in stool, constipation, diarrhea and heartburn.  Genitourinary: Negative for dysuria, frequency and urgency.  Musculoskeletal: Negative for falls, joint pain and myalgias.  Skin: Negative for rash.  Neurological: Negative for dizziness, sensory change and headaches.  Endo/Heme/Allergies: Does not bruise/bleed easily.  Psychiatric/Behavioral: Negative for depression, substance  abuse and suicidal ideas. The patient is not nervous/anxious.     Objective:   Vitals:   11/26/20 1021  BP: 136/80  Pulse: 85  Temp: (!) 97.5 F (36.4 C)  SpO2:  98%   Body mass index is 68.63 kg/m.  Physical Examination:  Physical Exam Vitals reviewed. Exam conducted with a chaperone present.  Constitutional:      Appearance: She is obese.  HENT:     Right Ear: External ear normal.     Left Ear: External ear normal.  Eyes:     Extraocular Movements: Extraocular movements intact.     Conjunctiva/sclera: Conjunctivae normal.  Cardiovascular:     Rate and Rhythm: Normal rate and regular rhythm.     Pulses: Normal pulses.     Heart sounds: Normal heart sounds.  Pulmonary:     Effort: Pulmonary effort is normal.     Breath sounds: Normal breath sounds.  Chest:  Breasts:     Right: Normal. No axillary adenopathy or supraclavicular adenopathy.     Left: Normal. No axillary adenopathy or supraclavicular adenopathy.    Abdominal:     General: Bowel sounds are normal.     Palpations: Abdomen is soft.  Genitourinary:    Comments: Deferred pelvic exam to GYN Musculoskeletal:     Cervical back: Normal range of motion and neck supple.     Right lower leg: Edema present.     Left lower leg: Edema present.  Lymphadenopathy:     Cervical: No cervical adenopathy.     Upper Body:     Right upper body: No supraclavicular, axillary or pectoral adenopathy.     Left upper body: No supraclavicular, axillary or pectoral adenopathy.  Skin:    General: Skin is warm and dry.     Findings: No erythema.  Neurological:     Mental Status: She is alert and oriented to person, place, and time.  Psychiatric:        Mood and Affect: Mood normal.        Behavior: Behavior normal.        Thought Content: Thought content normal.    ASSESSMENT and PLAN: This visit occurred during the SARS-CoV-2 public health emergency.  Safety protocols were in place, including screening questions prior to the visit, additional usage of staff PPE, and extensive cleaning of exam room while observing appropriate contact time as indicated for disinfecting solutions.   [name] [name]  was seen today for annual exam.  Diagnoses and all orders for this visit:  Encounter for preventative adult health care exam with abnormal findings -     Comprehensive metabolic panel -     TSH  Type 2 diabetes mellitus with other specified complication, without long-term current use of insulin (HCC) -     Hemoglobin A1c -     Microalbumin / creatinine urine ratio  Mixed hyperlipidemia -     Lipid panel  Class 3 severe obesity due to excess calories with serious comorbidity and body mass index (BMI) of 60.0 to 69.9 in adult [facility])  Need for pneumococcal vaccine -     Pneumococcal polysaccharide vaccine 23-valent greater than or equal to 2yo subcutaneous/IM  Recurrent major depressive disorder, in full remission ([place]) -     citalopram (CELEXA) 10 MG tablet; Take 1 tablet (10 mg total) by mouth daily.  Schedule appt for annual mammogram. youe are due 03/2021. Schedule appt for annual eye exam and dental cleaning every 84month. Schedule appt with GYN for IUD  check and PAP smear. Use eucerin or cerave or cetaphil cream for moisturizer.    Problem List Items Addressed This Visit      Endocrine   Diabetes mellitus ([place])    Controlled with ozempic, managed by weight management clinic Repeat hgbA1c, urine microalbumin, lipid panel, and CMP. F/up in 24month      Relevant Orders   Hemoglobin A1c   Microalbumin / creatinine urine ratio     Other   Class 3 severe obesity with serious comorbidity and body mass index (BMI) of 60.0 to 69.9 in adult ([facility]    Followed by health and wellness clinic      Depression, major, single episode, complete remission ([place]    Improved and stable mood with celexa Refill sent      Relevant Medications   citalopram (CELEXA) 10 MG tablet   Mixed hyperlipidemia   Relevant Orders   Lipid panel    Other Visit Diagnoses    Encounter for preventative adult health care exam with abnormal findings    -  Primary   Relevant Orders   Comprehensive  metabolic panel   TSH   Need for pneumococcal vaccine       Relevant Orders   Pneumococcal polysaccharide vaccine 23-valent greater than or equal to 2yo subcutaneous/IM (Completed)      Follow up: Return in about 6 months (around 05/28/2021) for DM and HTN, hyperlipidemia (fasting).  CWilfred Lacy NP

## 2020-11-26 NOTE — Assessment & Plan Note (Signed)
Improved and stable mood with celexa Refill sent

## 2020-11-26 NOTE — Assessment & Plan Note (Signed)
Controlled with ozempic, managed by weight management clinic Repeat hgbA1c, urine microalbumin, lipid panel, and CMP. F/up in 4month

## 2020-11-26 NOTE — Patient Instructions (Addendum)
Schedule appt for annual mammogram. your are due 03/2021. Schedule appt for annual eye exam and dental cleaning every 62month. Schedule appt with GYN for IUD check and PAP smear.  Use eucerin or cerave or cetaphil cream for moisturizer.  Go to lab for blood draw and urine collection.  Preventive Care [age]Years Old, Female Preventive care refers to lifestyle choices and visits with your health care provider that can promote health and wellness. This includes:  A yearly physical exam. This is also called an annual wellness visit.  Regular dental and eye exams.  Immunizations.  Screening for certain conditions.  Healthy lifestyle choices, such as: ? Eating a healthy diet. ? Getting regular exercise. ? Not using drugs or products that contain nicotine and tobacco. ? Limiting alcohol use. What can I expect for my preventive care visit? Physical exam Your health care provider will check your:  Height and weight. These may be used to calculate your BMI (body mass index). BMI is a measurement that tells if you are at a healthy weight.  Heart rate and blood pressure.  Body temperature.  Skin for abnormal spots. Counseling Your health care provider may ask you questions about your:  Past medical problems.  Family's medical history.  Alcohol, tobacco, and drug use.  Emotional well-being.  Home life and relationship well-being.  Sexual activity.  Diet, exercise, and sleep habits.  Work and work eStatistician  Access to firearms.  Method of birth control.  Menstrual cycle.  Pregnancy history. What immunizations do I need? Vaccines are usually given at various ages, according to a schedule. Your health care provider will recommend vaccines for you based on your age, medical history, and lifestyle or other factors, such as travel or where you work.   What tests do I need? Blood tests  Lipid and cholesterol levels. These may be checked every 5 years, or more often  if you are over 52years old.  Hepatitis C test.  Hepatitis B test. Screening  Lung cancer screening. You may have this screening every year starting at age [id number] you have a 30-pack-year history of smoking and currently smoke or have quit within the past 15 years.  Colorectal cancer screening. ? All adults should have this screening starting at age [id number] continuing until age 42 ? Your health care provider may recommend screening at age 6058if you are at increased risk. ? You will have tests every 1-10 years, depending on your results and the type of screening test.  Diabetes screening. ? This is done by checking your blood sugar (glucose) after you have not eaten for a while (fasting). ? You may have this done every 1-3 years.  Mammogram. ? This may be done every 1-2 years. ? Talk with your health care provider about when you should start having regular mammograms. This may depend on whether you have a family history of breast cancer.  BRCA-related cancer screening. This may be done if you have a family history of breast, ovarian, tubal, or peritoneal cancers.  Pelvic exam and Pap test. ? This may be done every 3 years starting at age 42 ? Starting at age 42 this may be done every 5 years if you have a Pap test in combination with an HPV test. Other tests  STD (sexually transmitted disease) testing, if you are at risk.  Bone density scan. This is done to screen for osteoporosis. You may have this scan if you are at high risk for osteoporosis. Talk  with your health care provider about your test results, treatment options, and if necessary, the need for more tests. Follow these instructions at home: Eating and drinking  Eat a diet that includes fresh fruits and vegetables, whole grains, lean protein, and low-fat dairy products.  Take vitamin and mineral supplements as recommended by your health care provider.  Do not drink alcohol if: ? Your health care provider tells you  not to drink. ? You are pregnant, may be pregnant, or are planning to become pregnant.  If you drink alcohol: ? Limit how much you have to 0-1 drink a day. ? Be aware of how much alcohol is in your drink. In the [place] one drink equals one 12 oz bottle of beer (355 mL), one 5 oz glass of wine (148 mL), or one 1 oz glass of hard liquor (44 mL).   Lifestyle  Take daily care of your teeth and gums. Brush your teeth every morning and night with fluoride toothpaste. Floss one time each day.  Stay active. Exercise for at least 30 minutes 5 or more days each week.  Do not use any products that contain nicotine or tobacco, such as cigarettes, e-cigarettes, and chewing tobacco. If you need help quitting, ask your health care provider.  Do not use drugs.  If you are sexually active, practice safe sex. Use a condom or other form of protection to prevent STIs (sexually transmitted infections).  If you do not wish to become pregnant, use a form of birth control. If you plan to become pregnant, see your health care provider for a prepregnancy visit.  If told by your health care provider, take low-dose aspirin daily starting at age 10.  Find healthy ways to cope with stress, such as: ? Meditation, yoga, or listening to music. ? Journaling. ? Talking to a trusted person. ? Spending time with friends and family. Safety  Always wear your seat belt while driving or riding in a vehicle.  Do not drive: ? If you have been drinking alcohol. Do not ride with someone who has been drinking. ? When you are tired or distracted. ? While texting.  Wear a helmet and other protective equipment during sports activities.  If you have firearms in your house, make sure you follow all gun safety procedures. What's next?  Visit your health care provider once a year for an annual wellness visit.  Ask your health care provider how often you should have your eyes and teeth checked.  Stay up to date on all  vaccines. This information is not intended to replace advice given to you by your health care provider. Make sure you discuss any questions you have with your health care provider. Document Revised: 05/13/2020 Document Reviewed: 04/20/2018 Elsevier Patient Education  2021 [employer].

## 2020-11-26 NOTE — Assessment & Plan Note (Signed)
Followed by health and wellness clinic

## 2020-11-27 ENCOUNTER — Encounter: Payer: Self-pay | Admitting: Adult Health

## 2020-11-27 ENCOUNTER — Ambulatory Visit (INDEPENDENT_AMBULATORY_CARE_PROVIDER_SITE_OTHER): Payer: Medicare Other | Admitting: Adult Health

## 2020-11-27 ENCOUNTER — Other Ambulatory Visit: Payer: Self-pay

## 2020-11-27 DIAGNOSIS — I5032 Chronic diastolic (congestive) heart failure: Secondary | ICD-10-CM

## 2020-11-27 DIAGNOSIS — G4733 Obstructive sleep apnea (adult) (pediatric): Secondary | ICD-10-CM

## 2020-11-27 DIAGNOSIS — Z930 Tracheostomy status: Secondary | ICD-10-CM

## 2020-11-27 DIAGNOSIS — Z6841 Body Mass Index (BMI) 40.0 and over, adult: Secondary | ICD-10-CM

## 2020-11-27 DIAGNOSIS — E66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: Secondary | ICD-10-CM

## 2020-11-27 NOTE — Progress Notes (Signed)
_0  ID: [name], female    DOB: 1980/11/03, 40 y.o.   MRN: [id number]  Chief Complaint  Patient presents with  . Follow-up    Referring provider: Flossie Buffy, NP  HPI: 40 year old female followed for severe obstructive sleep apnea/OHS, hypercarbic and hypoxic respiratory failure with chronic tracheostomy patient was diagnosed with sleep apnea in 2012 with AHI at 75/hour.  She is followed at the trach clinic she has a size 6 cuffless trach.  Medical history significant for diastolic heart failure, pulmonary hypertension. Hospitalization 2017 with critical illness requiring tracheostomy. Hospitalization October 2020 with acute on chronic hypoxic and hypercarbic respiratory failure Hospitalization June 2021 with acute on chronic hypercarbic respiratory failure with congestive heart failure and pneumonia requiring vent support. Covid infection late January 2022   TEST/EVENTS :  11/16/2016 sleep study showed severe sleep apnea, AHI 59, optimal C Pap pressure was 12 cm of H2O (done with capped trach )  2D echo August 07, 2019 EF 60 to 90%, RV systolic function normal RV size normal  11/27/2020 Follow up : OSA , O2 RF  Patient returns for a 59-monthfollow-up.  Patient is followed for chronic hypoxic and hypercarbic respiratory failure with obstructive sleep apnea and obesity hypoventilation syndrome.  She is prone to recurrent hospitalizations with decompensated heart failure and acute on chronic hypercarbic and hypoxic respiratory failure.  Last admission was June 2021.  Since last visit patient says she has been doing okay.  She feels that she is at baseline.  She gets short of breath with heavy activities.  She continues to go to the trach clinic for trach changes.  Patient has been able to wean her oxygen down to 2 L nasal cannula.  She is now able to use her Passy-Muir valve.  She does use 4 L / 40% oxygen at bedtime with trach collar and trach unplugged. She is  awaiting a portable concentrator as she has qualified for POC to help her be more independent and help her with exercise and weight loss.  She is working hard on losing weight.  She is following with a healthy weight and wellness.  And is on Ozempic for weight loss Last visit patient required 5 L of pulsed oxygen on portable concentrator to maintain O2 saturations greater than 90% with trach using Passy-Muir valve off.  Says had a mild case of Covid late January 2022. No known issues. Did well. Has not gotten booster yet. Discussed getting booster.   Influenza and Pneumovax is up-to-date.  Allergies  Allergen Reactions  . No Known Allergies     Immunization History  Administered Date(s) Administered  . Influenza,inj,Quad PF,6+ Mos 06/30/2016, 05/28/2020  . PFIZER(Purple Top)SARS-COV-2 Vaccination 11/28/2019, 12/19/2019  . Pneumococcal Polysaccharide-23 11/26/2020    Past Medical History:  Diagnosis Date  . [place]18[place]  . [place]    Echo 10/12: EF 50-55 // b. Echo 10/17: EF 50-55, mild BAE, PASP 42 // Echo 10/18: Mild concentric LVH, EF 55-60, normal wall motion, mild MAC, severe LAE, mild TR, PASP 15, trivial pericardial effusion  . CKD (chronic kidney disease) stage 4, GFR 15-29 ml/min (HCC) 10/25/2016   admitted in 2/18 with SCr of 10 in the setting of dehydration from illness, etc >> improved to 5 at DC  . CKD (chronic kidney disease) stage 4, GFR 15-29 ml/min (HCC) 10/25/2016  . DM2 (diabetes mellitus, type 2) (HCC)    A1c 7.2 in  05/2016  . Gestational diabetes mellitus in pregnancy 05/24/2013  . HCAP (healthcare-associated pneumonia)   . HLD (hyperlipidemia)   . HTN (hypertension)   . HTN in pregnancy, chronic 05/24/2013  . Hypertensive heart disease with CHF (congestive heart failure) ([place]) 05/25/2016  . Lower extremity edema   . Morbid obesity ([place])   . OSA (obstructive sleep apnea) 07/09/2011    s/p Trach 2/2 prolonged respiratory failure during admx for CHF in 05/2016  . Prediabetes   . Pulmonary hypertension ([place])    secondary from CHF/OSA  . Sinus tachycardia   . SOB (shortness of breath)     Tobacco History: Social History   Tobacco Use  Smoking Status Never Smoker  Smokeless Tobacco Never Used   Counseling given: Not Answered   Outpatient Medications Prior to Visit  Medication Sig Dispense Refill  . amLODipine (NORVASC) 5 MG tablet Take 1 tablet (5 mg total) by mouth daily. 90 tablet 2  . atorvastatin (LIPITOR) 10 MG tablet Take 1 tablet (10 mg total) by mouth daily. 90 tablet 3  . carvedilol (COREG) 25 MG tablet Take 1 tablet (25 mg total) by mouth 2 (two) times daily. 180 tablet 3  . citalopram (CELEXA) 10 MG tablet Take 1 tablet (10 mg total) by mouth daily. 90 tablet 3  . furosemide (LASIX) 40 MG tablet TAKE 1 TABLET BY MOUTH EVERY DAY 90 tablet 3  . hydrALAZINE (APRESOLINE) 100 MG tablet TAKE 1 TABLET BY MOUTH THREE TIMES A DAY 270 tablet 3  . isosorbide dinitrate (ISORDIL) 30 MG tablet TAKE 1 TABLET BY MOUTH THREE TIMES A DAY 270 tablet 3  . Semaglutide,0.25 or 0.5MG/DOS, (OZEMPIC, 0.25 OR 0.5 MG/DOSE,) 2 MG/1.5ML SOPN Inject 0.5 mg into the skin once a week. 1.5 mL 0   No facility-administered medications prior to visit.     Review of Systems:   Constitutional:   No  weight loss, night sweats,  Fevers, chills,  +fatigue, or  lassitude.  HEENT:   No headaches,  Difficulty swallowing,  Tooth/dental problems, or  Sore throat,                No sneezing, itching, ear ache, nasal congestion, post nasal drip,   CV:  No chest pain,  Orthopnea, PND, swelling in lower extremities, anasarca, dizziness, palpitations, syncope.   GI  No heartburn, indigestion, abdominal pain, nausea, vomiting, diarrhea, change in bowel habits, loss of appetite, bloody stools.   Resp:    No chest wall deformity  Skin: no rash or lesions.  GU: no dysuria, change in color of  urine, no urgency or frequency.  No flank pain, no hematuria   MS:  No joint pain or swelling.  No decreased range of motion.  No back pain.    Physical Exam  BP 118/68 (BP Location: Left Wrist, Cuff Size: Large)   Pulse 83   Temp 97.6 F (36.4 C) (Temporal)   Ht _0  (1.651 m)   Wt (!) 390 lb 12.8 oz (177.3 kg)   SpO2 99% Comment: 2L O2  BMI 65.03 kg/m   GEN: A/Ox3; pleasant , NAD, On O2    HEENT:  [place]/AT,   NOSE-clear, THROAT-clear, no lesions, no postnasal drip or exudate noted.   NECK:  Supple w/ fair ROM; no JVD; normal carotid impulses w/o bruits; no thyromegaly or nodules palpated; no lymphadenopathy.   Trach midline , stoma pick , no redness/drainage, dressing clean and dry   RESP  Clear  P & A;  w/o, wheezes/ rales/ or rhonchi. no accessory muscle use, no dullness to percussion  CARD:  RRR, no m/r/g, tr  peripheral edema, pulses intact, no cyanosis or clubbing.  GI:   Soft & nt; nml bowel sounds; no organomegaly or masses detected.   Musco: Warm bil, no deformities or joint swelling noted.   Neuro: alert, no focal deficits noted.    Skin: Warm, no lesions or rashes    Lab Results:  CBC  BNP  ProBNP No results found for: PROBNP  Imaging: No results found.    No flowsheet data found.  No results found for: NITRICOXIDE      Assessment & Plan:   Tracheostomy dependence ([place]) Patient has a history of chronic hypoxic and hypercarbic respiratory failure with obstructive sleep apnea and OHS with tracheostomy since 2017.  Since then has had recurrent hospitalizations for acute on chronic hypercarbic and hypoxic respiratory failure and decompensated heart failure. She is focusing on weight loss.  Continue with trach care.  And follow-up with the trach clinic. She is doing well currently with her trach.  No hospitalizations over the last 9 months. Continue oxygen 2 L with trach plugged/Passy-Muir valve on and trach collar 4 L at bedtime with trach  unplugged.  Plan  Patient Instructions  Continue on Oxygen 2l/m with trach plugged/[place] Muir valve on  Continue on Trach collar 4l/m at bedtime with trach unplugged.  Continue with daily trach care  Activity as tolerated, advance as tolerated.  Low salt diet  Work on healthy weight Follow-up in trach clinic as planned Follow-up in 3 months Dr. 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  And oxygen to maintain O2 saturations greater than 88 to 90%.  Class 3 severe obesity with serious comorbidity and body mass index (BMI) of 60.0 to 69.9 in adult [facility]) Patient is following with healthy weight and wellness.  Continue with weight loss program.  Chronic diastolic heart failure (HCC) Appears euvolemic on exam.  Continue on current regimen.  OSA (obstructive sleep apnea) Severe obstructive sleep apnea with OHS and chronic respiratory failure. We will continue with tracheostomy.  Long discussion with patient regarding weight loss.  Once patient has reached goal weight can consider repeat sleep study with capped trach if able.     [name], NP 11/27/2020

## 2020-11-27 NOTE — Patient Instructions (Signed)
Continue on Oxygen 2l/m with trach plugged/Passey Muir valve on  Continue on Trach collar 4l/m at bedtime with trach unplugged.  Continue with daily trach care  Activity as tolerated, advance as tolerated.  Low salt diet  Work on healthy weight Follow-up in trach clinic as planned Follow-up in 3 months Dr. Halford Chessman or Laikynn Pollio NP and As needed   Please contact office for sooner follow up if symptoms do not improve or worsen or seek emergency care

## 2020-11-27 NOTE — Assessment & Plan Note (Signed)
Patient has a history of chronic hypoxic and hypercarbic respiratory failure with obstructive sleep apnea and OHS with tracheostomy since 2017.  Since then has had recurrent hospitalizations for acute on chronic hypercarbic and hypoxic respiratory failure and decompensated heart failure. She is focusing on weight loss.  Continue with trach care.  And follow-up with the trach clinic. She is doing well currently with her trach.  No hospitalizations over the last 9 months. Continue oxygen 2 L with trach plugged/Passy-Muir valve on and trach collar 4 L at bedtime with trach unplugged.  Plan  Patient Instructions  Continue on Oxygen 2l/m with trach plugged/Passey Muir valve on  Continue on Trach collar 4l/m at bedtime with trach unplugged.  Continue with daily trach care  Activity as tolerated, advance as tolerated.  Low salt diet  Work on healthy weight Follow-up in trach clinic as planned Follow-up in 3 months Dr. Halford Chessman or Anisha Starliper NP and As needed   Please contact office for sooner follow up if symptoms do not improve or worsen or seek emergency care

## 2020-11-27 NOTE — Assessment & Plan Note (Signed)
Severe obstructive sleep apnea with OHS and chronic respiratory failure. We will continue with tracheostomy.  Long discussion with patient regarding weight loss.  Once patient has reached goal weight can consider repeat sleep study with capped trach if able.

## 2020-11-27 NOTE — Assessment & Plan Note (Signed)
Continue with tracheostomy.  And oxygen to maintain O2 saturations greater than 88 to 90%.

## 2020-11-27 NOTE — Assessment & Plan Note (Signed)
Patient is following with healthy weight and wellness.  Continue with weight loss program.

## 2020-11-27 NOTE — Assessment & Plan Note (Signed)
Appears euvolemic on exam.  Continue on current regimen. 

## 2020-11-28 NOTE — Progress Notes (Signed)
Reviewed and agree with assessment/plan.   Chesley Mires, MD [facility] Pulmonary/Critical Care 11/28/2020, 8:19 AM Pager:  [phone]

## 2020-12-01 ENCOUNTER — Telehealth: Payer: Self-pay | Admitting: Adult Health

## 2020-12-01 NOTE — Telephone Encounter (Signed)
I called [employer] and they stated that they needed the most recent walk that was done in the office so they can get her recertified for her oxygen.  This was done in Jan 2022 and I have printed this off and faxed it to [employer] at [phone].  Nothing further is needed.

## 2020-12-03 ENCOUNTER — Telehealth (INDEPENDENT_AMBULATORY_CARE_PROVIDER_SITE_OTHER): Payer: Medicare Other | Admitting: Bariatrics

## 2020-12-03 ENCOUNTER — Encounter (INDEPENDENT_AMBULATORY_CARE_PROVIDER_SITE_OTHER): Payer: Self-pay | Admitting: Bariatrics

## 2020-12-03 DIAGNOSIS — I152 Hypertension secondary to endocrine disorders: Secondary | ICD-10-CM

## 2020-12-03 DIAGNOSIS — Z6841 Body Mass Index (BMI) 40.0 and over, adult: Secondary | ICD-10-CM

## 2020-12-03 DIAGNOSIS — E1159 Type 2 diabetes mellitus with other circulatory complications: Secondary | ICD-10-CM

## 2020-12-03 DIAGNOSIS — E782 Mixed hyperlipidemia: Secondary | ICD-10-CM

## 2020-12-04 NOTE — Progress Notes (Signed)
TeleHealth Visit:  Due to the COVID-19 pandemic, this visit was completed with telemedicine (audio/video) technology to reduce patient and provider exposure as well as to preserve personal protective equipment.   [name] [name] has verbally consented to this TeleHealth visit. The patient is located at home, the provider is located at the [name] office. The participants in this visit include the listed provider and patient. The visit was conducted today via telephone.  [name] [name] was unable to use realtime audiovisual technology today and the telehealth visit was conducted via telephone.  Chief Complaint: OBESITY [name] [name] is here to discuss her progress with her obesity treatment plan along with follow-up of her obesity related diagnoses. [name] [name] is on keeping a food journal and adhering to recommended goals of 1200 calories and 95 grams of protein and states she is following her eating plan approximately 95% of the time. [name] [name] states she is walking for 30+ minutes 4 times per week.  Today's visit was #: 8 Starting weight: 393 lbs Starting date: 07/01/2020  Interim History: [name] [name] is unsure if she has lost weight.  She has been journaling, but not tracking protein.  Subjective:   1. Mixed hyperlipidemia [name] [name] has hyperlipidemia and has been trying to improve her cholesterol levels with intensive lifestyle modification including a low saturated fat diet, exercise and weight loss. She denies any chest pain, claudication or myalgias.  Taking Lipitor.  Lab Results  Component Value Date   ALT 14 11/26/2020   AST 13 11/26/2020   ALKPHOS 103 11/26/2020   BILITOT 0.6 11/26/2020   Lab Results  Component Value Date   CHOL 171 11/26/2020   HDL 43.40 11/26/2020   LDLCALC 105 (H) 11/26/2020   LDLDIRECT 99.0 09/15/2016   TRIG 110.0 11/26/2020   CHOLHDL 4 11/26/2020   2. Hypertension associated with type 2 diabetes mellitus ([name] [name]) Review: taking medications as instructed, no  medication side effects noted, no chest pain on exertion, no dyspnea on exertion, no swelling of ankles.  Taking Norvasc, Coreg, and hydralazine.  BP Readings from Last 3 Encounters:  11/27/20 118/68  11/26/20 136/80  11/19/20 129/68   Assessment/Plan:   1. Mixed hyperlipidemia Cardiovascular risk and specific lipid/LDL goals reviewed.  We discussed several lifestyle modifications today and Yuriko will continue to work on diet, exercise and weight loss efforts. Orders and follow up as documented in patient record.  Continue Lipitor.  Counseling Intensive lifestyle modifications are the first line treatment for this issue. . Dietary changes: Increase soluble fiber. Decrease simple carbohydrates. . Exercise changes: Moderate to vigorous-intensity aerobic activity 150 minutes per week if tolerated. . Lipid-lowering medications: see documented in medical record.  2. Hypertension associated with type 2 diabetes mellitus ([place]) [name] [name] is working on healthy weight loss and exercise to improve blood pressure control. We will watch for signs of hypotension as she continues her lifestyle modifications.  Will continue medications.   3. Class 3 severe obesity with serious comorbidity and body mass index (BMI) of 60.0 to 69.9 in adult, unspecified obesity type (HCC)  [name] [name] is currently in the action stage of change. As such, her goal is to continue with weight loss efforts. She has agreed to keeping a food journal and adhering to recommended goals of 1200 calories and 80 grams of protein.   She will work on meal planning, intentional eating, and increasing protein intake.  Exercise goals: Continue walking.  Behavioral modification strategies: increasing lean protein intake, decreasing simple carbohydrates, increasing vegetables, increasing water intake, decreasing eating  out, no skipping meals, meal planning and cooking strategies, keeping healthy foods in the home and planning for  success.  [name] [name] has agreed to follow-up with our clinic in 2 weeks with Mina Marble, NP. She was informed of the importance of frequent follow-up visits to maximize her success with intensive lifestyle modifications for her multiple health conditions.  Objective:   VITALS: Per patient if applicable, see vitals. GENERAL: Alert and in no acute distress. CARDIOPULMONARY: No increased WOB. Speaking in clear sentences.  PSYCH: Pleasant and cooperative. Speech normal rate and rhythm. Affect is appropriate. Insight and judgement are appropriate. Attention is focused, linear, and appropriate.  NEURO: Oriented as arrived to appointment on time with no prompting.   Lab Results  Component Value Date   CREATININE 0.85 11/26/2020   BUN 19 11/26/2020   NA 139 11/26/2020   K 3.5 11/26/2020   CL 99 11/26/2020   CO2 32 11/26/2020   Lab Results  Component Value Date   ALT 14 11/26/2020   AST 13 11/26/2020   ALKPHOS 103 11/26/2020   BILITOT 0.6 11/26/2020   Lab Results  Component Value Date   HGBA1C 5.9 11/26/2020   HGBA1C 5.5 06/04/2020   HGBA1C 6.2 11/20/2019   HGBA1C 6.2 09/15/2016   HGBA1C 7.9 (H) 05/25/2016   Lab Results  Component Value Date   TSH 1.97 11/26/2020   Lab Results  Component Value Date   CHOL 171 11/26/2020   HDL 43.40 11/26/2020   LDLCALC 105 (H) 11/26/2020   LDLDIRECT 99.0 09/15/2016   TRIG 110.0 11/26/2020   CHOLHDL 4 11/26/2020   Lab Results  Component Value Date   WBC 5.1 03/13/2020   HGB 13.1 03/13/2020   HCT 42.5 03/13/2020   MCV 86.7 03/13/2020   PLT 138.0 (L) 03/13/2020   Lab Results  Component Value Date   IRON 54 09/19/2016   TIBC 227 (L) 09/19/2016    Attestation Statements:   Reviewed by clinician on day of visit: allergies, medications, problem list, medical history, surgical history, family history, social history, and previous encounter notes.  Time spent on visit including pre-visit chart review and post-visit charting and care  was 20 minutes.   I, Water quality scientist, CMA, am acting as Location manager for [employer], DO  I have reviewed the above documentation for accuracy and completeness, and I agree with the above. Jearld Lesch, DO

## 2020-12-05 ENCOUNTER — Other Ambulatory Visit (INDEPENDENT_AMBULATORY_CARE_PROVIDER_SITE_OTHER): Payer: Self-pay | Admitting: Adult Health

## 2020-12-05 DIAGNOSIS — E1169 Type 2 diabetes mellitus with other specified complication: Secondary | ICD-10-CM

## 2020-12-07 ENCOUNTER — Encounter (INDEPENDENT_AMBULATORY_CARE_PROVIDER_SITE_OTHER): Payer: Self-pay | Admitting: Bariatrics

## 2020-12-08 NOTE — Telephone Encounter (Signed)
Pt last seen by Dr. Owens Shark.

## 2020-12-15 ENCOUNTER — Other Ambulatory Visit: Payer: Self-pay

## 2020-12-15 DIAGNOSIS — I10 Essential (primary) hypertension: Secondary | ICD-10-CM

## 2020-12-15 MED ORDER — HYDRALAZINE HCL 100 MG PO TABS: 100.0000 mg | ORAL_TABLET | Freq: Three times a day (TID) | ORAL | 1 refills | Status: DC

## 2020-12-17 ENCOUNTER — Other Ambulatory Visit (INDEPENDENT_AMBULATORY_CARE_PROVIDER_SITE_OTHER): Payer: Self-pay | Admitting: Adult Health

## 2020-12-17 ENCOUNTER — Other Ambulatory Visit: Payer: Self-pay

## 2020-12-17 DIAGNOSIS — E1169 Type 2 diabetes mellitus with other specified complication: Secondary | ICD-10-CM

## 2020-12-17 MED ORDER — FUROSEMIDE 40 MG PO TABS: 40.0000 mg | ORAL_TABLET | Freq: Every day | ORAL | 1 refills | Status: DC

## 2020-12-17 NOTE — Telephone Encounter (Signed)
Dr.Brown

## 2020-12-19 ENCOUNTER — Other Ambulatory Visit (INDEPENDENT_AMBULATORY_CARE_PROVIDER_SITE_OTHER): Payer: Self-pay | Admitting: Adult Health

## 2020-12-19 DIAGNOSIS — E1169 Type 2 diabetes mellitus with other specified complication: Secondary | ICD-10-CM

## 2020-12-22 ENCOUNTER — Encounter (INDEPENDENT_AMBULATORY_CARE_PROVIDER_SITE_OTHER): Payer: Self-pay

## 2020-12-22 NOTE — Telephone Encounter (Signed)
Dr.Brown

## 2020-12-23 ENCOUNTER — Other Ambulatory Visit: Payer: Self-pay

## 2020-12-23 ENCOUNTER — Telehealth: Payer: Self-pay | Admitting: Cardiology

## 2020-12-23 DIAGNOSIS — I10 Essential (primary) hypertension: Secondary | ICD-10-CM

## 2020-12-23 DIAGNOSIS — I5032 Chronic diastolic (congestive) heart failure: Secondary | ICD-10-CM

## 2020-12-23 MED ORDER — CARVEDILOL 25 MG PO TABS: 25.0000 mg | ORAL_TABLET | Freq: Two times a day (BID) | ORAL | 1 refills | Status: DC

## 2020-12-23 MED ORDER — ISOSORBIDE DINITRATE 30 MG PO TABS: 30.0000 mg | ORAL_TABLET | Freq: Three times a day (TID) | ORAL | 1 refills | Status: DC

## 2020-12-23 NOTE — Telephone Encounter (Signed)
Please address

## 2020-12-23 NOTE — Telephone Encounter (Signed)
*  STAT* If patient is at the pharmacy, call can be transferred to refill team.   1. Which medications need to be refilled? (please list name of each medication and dose if known) carvedilol (COREG) 25 MG tablet isosorbide dinitrate (ISORDIL) 30 MG tablet  2. Which pharmacy/location (including street and city if local pharmacy) is medication to be sent to?[id number] [id number]  3. Do they need a 30 day or 90 day supply? 90   Patient is state that she is out of the carvedilol (COREG) 25 MG tablet.

## 2020-12-24 ENCOUNTER — Ambulatory Visit (INDEPENDENT_AMBULATORY_CARE_PROVIDER_SITE_OTHER): Payer: Medicare Other | Admitting: Physician Assistant

## 2020-12-25 ENCOUNTER — Other Ambulatory Visit: Payer: Self-pay

## 2020-12-25 MED ORDER — AMLODIPINE BESYLATE 5 MG PO TABS: 5.0000 mg | ORAL_TABLET | Freq: Every day | ORAL | 1 refills | Status: DC

## 2020-12-26 ENCOUNTER — Telehealth: Payer: Self-pay | Admitting: Adult Health

## 2020-12-26 NOTE — Telephone Encounter (Signed)
Called and spoke with Tiana with [employer].  She is aware of qualifying walk scheduled for 05/11 at 10 am.   Sophronia Simas is going to reach out to the pt and let her know.  I advised her to have the pt call the office to reschedule if date and time is not ok with her.

## 2020-12-31 ENCOUNTER — Ambulatory Visit: Payer: Self-pay

## 2021-01-01 ENCOUNTER — Other Ambulatory Visit (INDEPENDENT_AMBULATORY_CARE_PROVIDER_SITE_OTHER): Payer: Self-pay | Admitting: Bariatrics

## 2021-01-01 ENCOUNTER — Other Ambulatory Visit (INDEPENDENT_AMBULATORY_CARE_PROVIDER_SITE_OTHER): Payer: Self-pay | Admitting: Adult Health

## 2021-01-01 DIAGNOSIS — E1169 Type 2 diabetes mellitus with other specified complication: Secondary | ICD-10-CM

## 2021-01-01 MED ORDER — OZEMPIC (0.25 OR 0.5 MG/DOSE) 2 MG/1.5ML ~~LOC~~ SOPN: 0.5000 mg | PEN_INJECTOR | SUBCUTANEOUS | 0 refills | Status: DC

## 2021-01-01 NOTE — Telephone Encounter (Signed)
Dr.Brown 

## 2021-01-01 NOTE — Telephone Encounter (Signed)
Refill request

## 2021-01-06 ENCOUNTER — Ambulatory Visit (INDEPENDENT_AMBULATORY_CARE_PROVIDER_SITE_OTHER): Payer: Medicare Other | Admitting: Physician Assistant

## 2021-01-08 ENCOUNTER — Ambulatory Visit: Payer: Self-pay

## 2021-01-20 ENCOUNTER — Ambulatory Visit: Payer: Self-pay

## 2021-01-28 ENCOUNTER — Other Ambulatory Visit: Payer: Self-pay

## 2021-01-28 ENCOUNTER — Ambulatory Visit (INDEPENDENT_AMBULATORY_CARE_PROVIDER_SITE_OTHER): Payer: Medicare Other | Admitting: Physician Assistant

## 2021-01-28 ENCOUNTER — Encounter (INDEPENDENT_AMBULATORY_CARE_PROVIDER_SITE_OTHER): Payer: Self-pay | Admitting: Physician Assistant

## 2021-01-28 VITALS — BP 159/74 | HR 78 | Temp 98.4°F | Ht 65.0 in | Wt >= 6400 oz

## 2021-01-28 DIAGNOSIS — E1169 Type 2 diabetes mellitus with other specified complication: Secondary | ICD-10-CM

## 2021-01-28 DIAGNOSIS — Z6841 Body Mass Index (BMI) 40.0 and over, adult: Secondary | ICD-10-CM | POA: Diagnosis not present

## 2021-01-28 MED ORDER — OZEMPIC (1 MG/DOSE) 2 MG/1.5ML ~~LOC~~ SOPN: 1.0000 mg | PEN_INJECTOR | SUBCUTANEOUS | 0 refills | Status: DC

## 2021-02-04 ENCOUNTER — Telehealth: Payer: Self-pay | Admitting: Pulmonary Disease

## 2021-02-04 NOTE — Progress Notes (Signed)
Chief Complaint:   OBESITY Man is here to discuss her progress with her obesity treatment plan along with follow-up of her obesity related diagnoses. Shantoya is on keeping a food journal and adhering to recommended goals of 1200 calories and 80 grams of protein daily and states she is following her eating plan approximately doing% of the time. Shaniyah states she is doing 0 minutes 0 times per week.  Today's visit was #: 9 Starting weight: 393 lbs Starting date: 07/01/2020 Today's weight: 401 lbs Today's date: 01/28/2021 Total lbs lost to date: 0 Total lbs lost since last in-office visit: 0  Interim History: Katee has not been here for an office visit since 11/19/2020. She has not been following a plan or journaling, but she is ready to restart.  Subjective:   1. Type 2 diabetes mellitus with other specified complication, without long-term current use of insulin (HCC) Odella's last A1c was 5.9. She notes Ozempic 0.5 mg is not helping decrease her appetite.  Assessment/Plan:   1. Type 2 diabetes mellitus with other specified complication, without long-term current use of insulin (HCC) Audreana agreed to increase Ozempic to 1 mg with no refills. Good blood sugar control is important to decrease the likelihood of diabetic complications such as nephropathy, neuropathy, limb loss, blindness, coronary artery disease, and death. Intensive lifestyle modification including diet, exercise and weight loss are the first line of treatment for diabetes.   - Semaglutide, 1 MG/DOSE, (OZEMPIC, 1 MG/DOSE,) 2 MG/1.5ML SOPN; Inject 1 mg into the skin once a week.  Dispense: 3 mL; Refill: 0  2. Class 3 severe obesity with serious comorbidity and body mass index (BMI) of 60.0 to 69.9 in adult, unspecified obesity type (HCC) Christe is currently in the action stage of change. As such, her goal is to continue with weight loss efforts. She has agreed to the Category 2 Plan.   Exercise goals: No  exercise has been prescribed at this time.  Behavioral modification strategies: meal planning and cooking strategies and keeping healthy foods in the home.  Toyna has agreed to follow-up with our clinic in 3 weeks. She was informed of the importance of frequent follow-up visits to maximize her success with intensive lifestyle modifications for her multiple health conditions.   Objective:   Blood pressure (!) 159/74, pulse 78, temperature 98.4 F (36.9 C), height _0  (1.651 m), weight (!) 401 lb (181.9 kg), SpO2 95 %, unknown if currently breastfeeding. Body mass index is 66.73 kg/m.  General: Cooperative, alert, well developed, in no acute distress. HEENT: Conjunctivae and lids unremarkable. Cardiovascular: Regular rhythm.  Lungs: Normal work of breathing. Neurologic: No focal deficits.   Lab Results  Component Value Date   CREATININE 0.85 11/26/2020   BUN 19 11/26/2020   NA 139 11/26/2020   K 3.5 11/26/2020   CL 99 11/26/2020   CO2 32 11/26/2020   Lab Results  Component Value Date   ALT 14 11/26/2020   AST 13 11/26/2020   ALKPHOS 103 11/26/2020   BILITOT 0.6 11/26/2020   Lab Results  Component Value Date   HGBA1C 5.9 11/26/2020   HGBA1C 5.5 06/04/2020   HGBA1C 6.2 11/20/2019   HGBA1C 6.2 09/15/2016   HGBA1C 7.9 (H) 05/25/2016   No results found for: INSULIN Lab Results  Component Value Date   TSH 1.97 11/26/2020   Lab Results  Component Value Date   CHOL 171 11/26/2020   HDL 43.40 11/26/2020   LDLCALC 105 (H) 11/26/2020  LDLDIRECT 99.0 09/15/2016   TRIG 110.0 11/26/2020   CHOLHDL 4 11/26/2020   Lab Results  Component Value Date   WBC 5.1 03/13/2020   HGB 13.1 03/13/2020   HCT 42.5 03/13/2020   MCV 86.7 03/13/2020   PLT 138.0 (L) 03/13/2020   Lab Results  Component Value Date   IRON 54 09/19/2016   TIBC 227 (L) 09/19/2016    Obesity Behavioral Intervention:   Approximately 15 minutes were spent on the discussion below.  ASK: We  discussed the diagnosis of obesity with Nancy today and Natalyia agreed to give [place] permission to discuss obesity behavioral modification therapy today.  ASSESS: Lorenna has the diagnosis of obesity and her BMI today is 66.73. Betania is in the action stage of change.   ADVISE: Stela was educated on the multiple health risks of obesity as well as the benefit of weight loss to improve her health. She was advised of the need for long term treatment and the importance of lifestyle modifications to improve her current health and to decrease her risk of future health problems.  AGREE: Multiple dietary modification options and treatment options were discussed and Allyah agreed to follow the recommendations documented in the above note.  ARRANGE: Siena was educated on the importance of frequent visits to treat obesity as outlined per CMS and USPSTF guidelines and agreed to schedule her next follow up appointment today.  Attestation Statements:   Reviewed by clinician on day of visit: allergies, medications, problem list, medical history, surgical history, family history, social history, and previous encounter notes.   Wilhemena Durie, am acting as transcriptionist for [employer], PA-C.  I have reviewed the above documentation for accuracy and completeness, and I agree with the above. Abby Potash, PA-C

## 2021-02-06 NOTE — Telephone Encounter (Signed)
No information in message.  Closing encounter.

## 2021-02-12 ENCOUNTER — Encounter: Payer: Self-pay | Admitting: Adult Health

## 2021-02-12 ENCOUNTER — Other Ambulatory Visit: Payer: Self-pay

## 2021-02-12 ENCOUNTER — Ambulatory Visit (INDEPENDENT_AMBULATORY_CARE_PROVIDER_SITE_OTHER): Payer: Medicare Other | Admitting: Adult Health

## 2021-02-12 DIAGNOSIS — G4733 Obstructive sleep apnea (adult) (pediatric): Secondary | ICD-10-CM

## 2021-02-12 DIAGNOSIS — I5032 Chronic diastolic (congestive) heart failure: Secondary | ICD-10-CM

## 2021-02-12 DIAGNOSIS — Z930 Tracheostomy status: Secondary | ICD-10-CM

## 2021-02-12 DIAGNOSIS — J9611 Chronic respiratory failure with hypoxia: Secondary | ICD-10-CM

## 2021-02-12 DIAGNOSIS — E66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: Secondary | ICD-10-CM

## 2021-02-12 DIAGNOSIS — J9612 Chronic respiratory failure with hypercapnia: Secondary | ICD-10-CM

## 2021-02-12 NOTE — Assessment & Plan Note (Signed)
>>  ASSESSMENT AND PLAN FOR HEART FAILURE WITH PRESERVED EJECTION FRACTION (HCC) WRITTEN ON 02/12/2021 12:11 PM BY PARRETT, TAMMY S, NP  Appears euvolemic on exam.  Continue on current regimen.

## 2021-02-12 NOTE — Assessment & Plan Note (Signed)
Appears euvolemic on exam.  Continue on current regimen.

## 2021-02-12 NOTE — Assessment & Plan Note (Signed)
Continue with daily trach care.  Continue follow-up with trach clinic.

## 2021-02-12 NOTE — Patient Instructions (Addendum)
Continue on Oxygen 2l/m with trach plugged/Passey Muir valve on  Continue on Trach collar 4l/m at bedtime with trach unplugged.  Continue with daily trach care  Activity as tolerated, advance as tolerated.  Low salt diet  Work on healthy weight Follow-up in trach clinic as planned Order for POC  Follow-up in 3 months Dr. Halford Chessman or Yvana Samonte NP and As needed   Please contact office for sooner follow up if symptoms do not improve or worsen or seek emergency care

## 2021-02-12 NOTE — Assessment & Plan Note (Signed)
Continue with chronic trach.  Continue with O2 to maintain O2 saturations greater than 88 to 90%

## 2021-02-12 NOTE — Assessment & Plan Note (Signed)
Continue follow-up with healthy weight and wellness.  Healthy weight loss discussed

## 2021-02-12 NOTE — Progress Notes (Signed)
_0  ID: [name] [name], female    DOB: Oct 28, 1980, 40 y.o.   MRN: [id number]  Chief Complaint  Patient presents with   Follow-up     Referring provider: Flossie Buffy, NP  HPI: 40 year old female followed for severe obstructive sleep apnea with OHS, hypercarbic and hypoxic respiratory failure with chronic tracheostomy. Diagnosed with sleep apnea 2012 with AHI at 75/hour. She is followed at the trach clinic with a size 6 cuffless trach Medical history significant for diastolic heart failure and pulmonary hypertension Hospitalization 2017 with critical illness requiring tracheostomy Hospitalization 2021 with acute on chronic hypercarbic respiratory failure with congestive heart failure and pneumonia requiring vent support. COVID infection late January 2022   TEST/EVENTS :  11/16/2016 sleep study showed severe sleep apnea, AHI 59, optimal C Pap pressure was 12 cm of H2O (done with capped trach )   2D echo August 07, 2019 EF 60 to 32%, RV systolic function normal RV size normal  02/12/2021 Follow up : OSA, OHS, O2 RF , Trach  patient presents for a follow-up visit.  She is followed for chronic hypoxic and hypercarbic respiratory failure with obstructive sleep apnea and obesity hypoventilation syndrome.  She is prone to recurrent hospitalizations with decompensated heart failure and acute on chronic hypercarbic and hypoxic respiratory failure overall patient says she is doing she is doing okay.  She remains on oxygen 2 L via nasal cannula.  She is able to use her Passy-Muir valve.  She does use 4 L / 40% oxygen at bedtime with trach collar and trach unplugged. Says overall she is doing okay.  She is trying to work on healthy weight loss.  She is currently following with healthy weight and wellness.  She says her weight is plateaued right now so she is working to start getting more pounds off.  She is trying to be more active but has to use oxygen tanks which are heavy to  carry.  We ordered a portable oxygen concentrator last visit to be used with 5 L pulsed oxygen to help with her mobility and independence however her insurance and DME company declined. Today in the office we walked her again patient's O2 saturations drop on room air 87%.  Required 5 L of pulsed oxygen to maintain O2 saturations greater than 88%.  She would greatly benefit from a portable battery-operated concentrator to help with mobility and independence in a young female with multiple comorbidities She says her trach is been doing well.  She denies any redness or drainage.  No fever. She does get short of breath with heavy activity such as prolonged walking.  But is trying to be more active. Leg swelling has been doing well.  She is had no increased leg swelling or orthopnea.  Allergies  Allergen Reactions   No Known Allergies     Immunization History  Administered Date(s) Administered   Influenza,inj,Quad PF,6+ Mos 06/30/2016, 05/28/2020   PFIZER(Purple Top)SARS-COV-2 Vaccination 11/28/2019, 12/19/2019   Pneumococcal Polysaccharide-23 11/26/2020    Past Medical History:  Diagnosis Date   Acute on chronic respiratory failure with hypoxia ([place]) 02/18/2020   Chronic diastolic heart failure ([place])    Echo 10/12: EF 50-55 // b. Echo 10/17: EF 50-55, mild BAE, PASP 42 // Echo 10/18: Mild concentric LVH, EF 55-60, normal wall motion, mild MAC, severe LAE, mild TR, PASP 15, trivial pericardial effusion   CKD (chronic kidney disease) stage 4, GFR 15-29 ml/min ([place]) 10/25/2016   admitted in 2/18 with SCr of 10  in the setting of dehydration from illness, etc >> improved to 5 at DC   CKD (chronic kidney disease) stage 4, GFR 15-29 ml/min (HCC) 10/25/2016   DM2 (diabetes mellitus, type 2) (HCC)    A1c 7.2 in 05/2016   Gestational diabetes mellitus in pregnancy 05/24/2013   HCAP (healthcare-associated pneumonia)    HLD (hyperlipidemia)    HTN (hypertension)    HTN in pregnancy, chronic 05/24/2013    Hypertensive heart disease with CHF (congestive heart failure) ([place]) 05/25/2016   Lower extremity edema    Morbid obesity (HCC)    OSA (obstructive sleep apnea) 07/09/2011   s/p Trach 2/2 prolonged respiratory failure during admx for CHF in 05/2016   Prediabetes    Pulmonary hypertension ([place])    secondary from CHF/OSA   Sinus tachycardia    SOB (shortness of breath)     Tobacco History: Social History   Tobacco Use  Smoking Status Never  Smokeless Tobacco Never   Counseling given: Not Answered   Outpatient Medications Prior to Visit  Medication Sig Dispense Refill   amLODipine (NORVASC) 5 MG tablet Take 1 tablet (5 mg total) by mouth daily. 90 tablet 1   atorvastatin (LIPITOR) 10 MG tablet Take 1 tablet (10 mg total) by mouth daily. 90 tablet 3   carvedilol (COREG) 25 MG tablet Take 1 tablet (25 mg total) by mouth 2 (two) times daily. 180 tablet 1   citalopram (CELEXA) 10 MG tablet Take 1 tablet (10 mg total) by mouth daily. 90 tablet 3   furosemide (LASIX) 40 MG tablet Take 1 tablet (40 mg total) by mouth daily. 90 tablet 1   hydrALAZINE (APRESOLINE) 100 MG tablet Take 1 tablet (100 mg total) by mouth 3 (three) times daily. 270 tablet 1   isosorbide dinitrate (ISORDIL) 30 MG tablet Take 1 tablet (30 mg total) by mouth 3 (three) times daily. 270 tablet 1   Semaglutide, 1 MG/DOSE, (OZEMPIC, 1 MG/DOSE,) 2 MG/1.5ML SOPN Inject 1 mg into the skin once a week. 3 mL 0   No facility-administered medications prior to visit.     Review of Systems:   Constitutional:   No  weight loss, night sweats,  Fevers, chills,  +fatigue, or  lassitude.  HEENT:   No headaches,  Difficulty swallowing,  Tooth/dental problems, or  Sore throat,                No sneezing, itching, ear ache, nasal congestion, post nasal drip,   CV:  No chest pain,  Orthopnea, PND,  anasarca, dizziness, palpitations, syncope.   GI  No heartburn, indigestion, abdominal pain, nausea, vomiting, diarrhea, change in  bowel habits, loss of appetite, bloody stools.   Resp:   No chest wall deformity  Skin: no rash or lesions.  GU: no dysuria, change in color of urine, no urgency or frequency.  No flank pain, no hematuria   MS:  No joint pain or swelling.  No decreased range of motion.  No back pain.    Physical Exam  Pulse 86   Ht 5' 5" (1.651 m)   Wt (!) 399 lb 12.8 oz (181.3 kg)   SpO2 98%   BMI 66.53 kg/m   GEN: A/Ox3; pleasant , NAD, BMI 66, on oxygen   HEENT:  [place]/AT,  , NOSE-clear, THROAT-clear, no lesions, no postnasal drip or exudate noted.   NECK:  Supple w/ fair ROM; no JVD; normal carotid impulses w/o bruits; no thyromegaly or nodules palpated; no lymphadenopathy.  Trach midline clean without redness or drainage.  RESP  Clear  P & A; w/o, wheezes/ rales/ or rhonchi. no accessory muscle use, no dullness to percussion  CARD:  RRR, no m/r/g, tr peripheral edema, pulses intact, no cyanosis or clubbing. Stasis dermatitis changes of the lower extremities  GI:   Soft & nt; nml bowel sounds; no organomegaly or masses detected.   Musco: Warm bil, no deformities or joint swelling noted.   Neuro: alert, no focal deficits noted.    Skin: Warm, no lesions or rashes    Lab Results:      ProBNP No results found for: PROBNP  Imaging: No results found.    No flowsheet data found.  No results found for: NITRICOXIDE      Assessment & Plan:   Tracheostomy dependence ([place]) Continue with daily trach care.  Continue follow-up with trach clinic.  OSA (obstructive sleep apnea) Severe OSA and OHS complicated by hypoxic and hypercarbic respiratory failure.  Requires chronic trach.  Continue with trach care and follow-up. Continue with trach with oxygen during the daytime with Passy-Muir valve.  [name] is uncapped at bedtime with trach collar.  Morbid obesity due to excess calories ([place]) Continue follow-up with healthy weight and wellness.  Healthy weight loss  discussed  Hypoventilation associated with obesity syndrome ([place]) Continue with chronic trach.  Continue with O2 to maintain O2 saturations greater than 88 to 90%  Heart failure with preserved ejection fraction (HCC) Appears euvolemic on exam.  Continue on current regimen.  Chronic respiratory failure with hypoxia and hypercapnia (HCC) Patient is continue on oxygen 2 L with Passy-Muir valve Use 4 L / 40% at bedtime with trach uncapped/trach collar Patient needs a portable oxygen concentrator to help with mobility and independence.  POC has been ordered.  She requires 5 L pulsed oxygen.    I spent  40 minutes dedicated to the care of this patient on the date of this encounter to include pre-visit review of records, face-to-face time with the patient discussing conditions above, post visit ordering of testing, clinical documentation with the electronic health record, making appropriate referrals as documented, and communicating necessary findings to members of the patients care team.   [name], NP 02/12/2021

## 2021-02-12 NOTE — Assessment & Plan Note (Signed)
Severe OSA and OHS complicated by hypoxic and hypercarbic respiratory failure.  Requires chronic trach.  Continue with trach care and follow-up. Continue with trach with oxygen during the daytime with Passy-Muir valve.  [name] is uncapped at bedtime with trach collar.

## 2021-02-12 NOTE — Assessment & Plan Note (Signed)
>>  ASSESSMENT AND PLAN FOR MORBID OBESITY DUE TO EXCESS CALORIES (HCC) WRITTEN ON 02/12/2021 12:11 PM BY PARRETT, TAMMY S, NP  Continue follow-up with healthy weight and wellness.  Healthy weight loss discussed

## 2021-02-12 NOTE — Assessment & Plan Note (Signed)
Patient is continue on oxygen 2 L with Passy-Muir valve Use 4 L / 40% at bedtime with trach uncapped/trach collar Patient needs a portable oxygen concentrator to help with mobility and independence.  POC has been ordered.  She requires 5 L pulsed oxygen.

## 2021-02-16 NOTE — Progress Notes (Signed)
Reviewed and agree with assessment/plan.   Chesley Mires, MD [facility] Pulmonary/Critical Care 02/16/2021, 3:24 PM Pager:  [phone]

## 2021-02-19 ENCOUNTER — Ambulatory Visit (INDEPENDENT_AMBULATORY_CARE_PROVIDER_SITE_OTHER): Payer: Medicare Other | Admitting: Adult Health

## 2021-02-19 ENCOUNTER — Encounter (INDEPENDENT_AMBULATORY_CARE_PROVIDER_SITE_OTHER): Payer: Self-pay

## 2021-02-20 NOTE — Progress Notes (Signed)
Cardiology Office Note    Date:  03/03/2021   ID:  [name] [name], DOB Aug 05, 1981, MRN [id number]   PCP:  Flossie Buffy, NP   [facility]  Cardiologist:  Candee Furbish, MD   Advanced Practice Provider:  No care team member to display Electrophysiologist:  None   [phone]   Chief Complaint  Patient presents with   Follow-up     History of Present Illness:  [name] [name] is a 40 y.o. female hx of HTN, OSA, morbid obesity, & gestational diabetes.  She was admitted in 05/2016 respiratory failure in the setting of HTN emergency with pulmonary edema and OHS/OSA requiring intubation.  She ultimately underwent tracheostomy due to prolonged respiratory failure.  She did have prolonged sinus pauses during coughing and straining against the ventilator.  This was reviewed with EP and she was not felt to need a PPM as her pauses were likely related to underlying OSA/OHS. Echocardiogram did demonstrate pulmonary hypertension with PASP 42 and normal ejection fraction.  She was admitted in 08/2016 with acute renal failure in the setting of dehydration. She has been followed in our hypertension clinic to help with control of blood pressure.  Patient comes in for f/u. On O2 2-4L/day. Going to weight loss center but hasn't had much success. Walks down long driveway. Thinking about bariatric surgery but has to lose weight first. Watching salt closely and swelling stable.      Past Medical History:  Diagnosis Date   Acute on chronic respiratory failure with hypoxia ([place]) 02/18/2020   Chronic diastolic heart failure ([place])    Echo 10/12: EF 50-55 // b. Echo 10/17: EF 50-55, mild BAE, PASP 42 // Echo 10/18: Mild concentric LVH, EF 55-60, normal wall motion, mild MAC, severe LAE, mild TR, PASP 15, trivial pericardial effusion   CKD (chronic kidney disease) stage 4, GFR 15-29 ml/min (HCC) 10/25/2016   admitted in 2/18 with SCr of 10 in the setting of dehydration  from illness, etc >> improved to 5 at DC   CKD (chronic kidney disease) stage 4, GFR 15-29 ml/min (HCC) 10/25/2016   DM2 (diabetes mellitus, type 2) (HCC)    A1c 7.2 in 05/2016   Gestational diabetes mellitus in pregnancy 05/24/2013   HCAP (healthcare-associated pneumonia)    HLD (hyperlipidemia)    HTN (hypertension)    HTN in pregnancy, chronic 05/24/2013   Hypertensive heart disease with CHF (congestive heart failure) ([place]) 05/25/2016   Lower extremity edema    Morbid obesity (HCC)    OSA (obstructive sleep apnea) 07/09/2011   s/p Trach 2/2 prolonged respiratory failure during admx for CHF in 05/2016   Prediabetes    Pulmonary hypertension ([place])    secondary from CHF/OSA   Sinus tachycardia    SOB (shortness of breath)     Past Surgical History:  Procedure Laterality Date   TRACHEOSTOMY TUBE PLACEMENT N/A 06/11/2016   Procedure: TRACHEOSTOMY;  Surgeon: Melida Quitter, MD;  Location: [facility] OR;  Service: ENT;  Laterality: N/A;    Current Medications: Current Meds  Medication Sig   amLODipine (NORVASC) 5 MG tablet Take 1 tablet (5 mg total) by mouth daily.   carvedilol (COREG) 25 MG tablet Take 1 tablet (25 mg total) by mouth 2 (two) times daily.   citalopram (CELEXA) 10 MG tablet Take 1 tablet (10 mg total) by mouth daily.   furosemide (LASIX) 40 MG tablet Take 1 tablet (40 mg total) by mouth daily.   hydrALAZINE (APRESOLINE)  100 MG tablet Take 1 tablet (100 mg total) by mouth 3 (three) times daily.   isosorbide dinitrate (ISORDIL) 30 MG tablet Take 1 tablet (30 mg total) by mouth 3 (three) times daily.   Semaglutide, 1 MG/DOSE, (OZEMPIC, 1 MG/DOSE,) 2 MG/1.5ML SOPN Inject 1 mg into the skin once a week.   [DISCONTINUED] atorvastatin (LIPITOR) 10 MG tablet Take 1 tablet (10 mg total) by mouth daily.     Allergies:   No known allergies   Social History   Socioeconomic History   Marital status: Single    Spouse name: Not on file   Number of children: Not on file   Years of  education: Not on file   Highest education level: Not on file  Occupational History   Occupation: disability   Tobacco Use   Smoking status: Never   Smokeless tobacco: Never  Vaping Use   Vaping Use: Never used  Substance and Sexual Activity   Alcohol use: No   Drug use: No   Sexual activity: Not Currently    Birth control/protection: I.U.D.  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   DENIES ANY TOBACCO OR ETOH USE         Social Determinants of Health   Financial Resource Strain: Low Risk    Difficulty of Paying Living Expenses: Not hard at all  Food Insecurity: No Food Insecurity   Worried About Charity fundraiser in the Last Year: Never true   Ran Out of Food in the Last Year: Never true  Transportation Needs: No Transportation Needs   Lack of Transportation (Medical): No   Lack of Transportation (Non-Medical): No  Physical Activity: Inactive   Days of Exercise per Week: 0 days   Minutes of Exercise per Session: 0 min  Stress: No Stress Concern Present   Feeling of Stress : Not at all  Social Connections: Moderately Integrated   Frequency of Communication with Friends and Family: More than three times a week   Frequency of Social Gatherings with Friends and Family: More than three times a week   Attends Religious Services: More than 4 times per year   Active Member of [employer] or Organizations: Yes   Attends Music therapist: More than 4 times per year   Marital Status: Never married     Family History:  The patient's  family history includes Birth defects in her paternal grandfather; Diabetes in her mother and another family member; Hyperlipidemia in her mother; Hypertension in her mother; Obesity in her mother.   ROS:   Please see the history of present illness.    ROS All other systems reviewed and are negative.   PHYSICAL EXAM:   VS:  BP 130/70   Pulse 94   Ht _0   (1.651 m)   Wt (!) 401 lb 12.8 oz (182.3 kg)   SpO2 97%   BMI 66.86 kg/m   Physical Exam  GEN: Obese, in no acute distress  Neck: trach, no JVD, carotid bruits, or masses Cardiac:RRR; no murmurs, rubs, or gallops  Respiratory:  clear to auscultation bilaterally, normal work of breathing GI: soft, nontender, nondistended, + BS Ext: without cyanosis, clubbing, or edema, Good distal pulses bilaterally Neuro:  Alert and Oriented x 3 Psych: euthymic mood, full affect  Wt Readings from Last 3 Encounters:  03/03/21 (!) 401 lb 12.8 oz (182.3 kg)  02/12/21 Marland Kitchen)  399 lb 12.8 oz (181.3 kg)  01/28/21 (!) 401 lb (181.9 kg)      Studies/Labs Reviewed:   EKG:  EKG is not ordered today.     Recent Labs: 03/13/2020: Hemoglobin 13.1; Platelets 138.0 11/26/2020: ALT 14; BUN 19; Creatinine, Ser 0.85; Potassium 3.5; Sodium 139; TSH 1.97   Lipid Panel    Component Value Date/Time   CHOL 171 11/26/2020 1130   CHOL 232 (H) 06/04/2020 1445   TRIG 110.0 11/26/2020 1130   HDL 43.40 11/26/2020 1130   HDL 56 06/04/2020 1445   CHOLHDL 4 11/26/2020 1130   VLDL 22.0 11/26/2020 1130   LDLCALC 105 (H) 11/26/2020 1130   LDLCALC 156 (H) 06/04/2020 1445   LDLDIRECT 99.0 09/15/2016 0855    Additional studies/ records that were reviewed today include:       ECHO IMPRESSIONS 07/2019   1. Left ventricular ejection fraction, by visual estimation, is 60 to  65%. The left ventricle has normal function. There is no left ventricular  hypertrophy.   2. The left ventricle has no regional wall motion abnormalities.   3. Global right ventricle has normal systolic function.The right  ventricular size is normal. No increase in right ventricular wall  thickness.   4. Left atrial size was mildly dilated.   5. Right atrial size was normal.   6. Trivial pericardial effusion is present.   7. The mitral valve is normal in structure. No evidence of mitral valve  regurgitation. No evidence of mitral stenosis.   8. The  tricuspid valve is normal in structure. Tricuspid valve  regurgitation is not demonstrated.   9. The aortic valve is normal in structure. Aortic valve regurgitation is  not visualized. No evidence of aortic valve sclerosis or stenosis.  10. The pulmonic valve was normal in structure. Pulmonic valve  regurgitation is not visualized.  11. TR signal is inadequate for assessing pulmonary artery systolic  pressure.  12. The inferior vena cava is dilated in size with <50% respiratory  variability, suggesting right atrial pressure of 15 mmHg.   Risk Assessment/Calculations:         ASSESSMENT:    1. Essential hypertension   2. Chronic diastolic CHF (congestive heart failure) ([place])   3. Morbid obesity ([place])   4. Chronic respiratory failure with hypoxia and hypercapnia (HCC)   5. Abnormal EKG   6. Mixed hyperlipidemia      PLAN:  In order of problems listed above:  HTN BP well controlled on amlodipine, carvedilol, hydralazine, isosorbide and Lasix  Chronic diastolic CHF well compensated.  Labs in April were normal  Morbid obesity going to weight loss center but has not had much success.  Considering bariatric surgery but has to lose weight first.  Hypercarbic respiratory failure/trach working with pulmonary to try to get off oxygen during the day.  Chronic RBBB  HLD LDL 1053/02/11.  Increase Lipitor to 20 mg once daily.  Recheck lipids in 3 months.    Shared Decision Making/Informed Consent        Medication Adjustments/Labs and Tests Ordered: Current medicines are reviewed at length with the patient today.  Concerns regarding medicines are outlined above.  Medication changes, Labs and Tests ordered today are listed in the Patient Instructions below. Patient Instructions  Medication Instructions:  Your physician has recommended you make the following change in your medication:  INCREASE: Atorvastatin to 48m daily  *If you need a refill on your cardiac medications  before your next appointment, please call your pharmacy*  Lab Work: Your physician recommends that you return for a FASTING lipid profile on 06/03/2021.  If you have labs (blood work) drawn today and your tests are completely normal, you will receive your results only by: [facility] (if you have MyChart) OR A paper copy in the mail If you have any lab test that is abnormal or we need to change your treatment, we will call you to review the results.   Follow-Up: At [facility], you and your health needs are our priority.  As part of our continuing mission to provide you with exceptional heart care, we have created designated Provider Care Teams.  These Care Teams include your primary Cardiologist (physician) and Advanced Practice Providers (APPs -  Physician Assistants and Nurse Practitioners) who all work together to provide you with the care you need, when you need it.   Your next appointment:   6 month(s)  The format for your next appointment:   In Person  Provider:   You may see Candee Furbish, MD or one of the following Advanced Practice Providers on your designated Care Team:   Cecilie Kicks, NP       Signed, Ermalinda Barrios, PA-C  03/03/2021 10:44 AM    [facility] [address], [place], [place]  [address] Phone: [phone]; Fax: [phone]

## 2021-02-26 ENCOUNTER — Ambulatory Visit: Payer: Medicare Other | Admitting: Adult Health

## 2021-03-03 ENCOUNTER — Ambulatory Visit (INDEPENDENT_AMBULATORY_CARE_PROVIDER_SITE_OTHER): Payer: Medicare Other | Admitting: Physician Assistant

## 2021-03-03 ENCOUNTER — Encounter: Payer: Self-pay | Admitting: Physician Assistant

## 2021-03-03 ENCOUNTER — Other Ambulatory Visit: Payer: Self-pay

## 2021-03-03 VITALS — BP 130/70 | HR 94 | Ht 65.0 in | Wt >= 6400 oz

## 2021-03-03 DIAGNOSIS — E782 Mixed hyperlipidemia: Secondary | ICD-10-CM

## 2021-03-03 DIAGNOSIS — I5032 Chronic diastolic (congestive) heart failure: Secondary | ICD-10-CM

## 2021-03-03 DIAGNOSIS — R9431 Abnormal electrocardiogram [ECG] [EKG]: Secondary | ICD-10-CM

## 2021-03-03 DIAGNOSIS — I10 Essential (primary) hypertension: Secondary | ICD-10-CM | POA: Diagnosis not present

## 2021-03-03 DIAGNOSIS — J9611 Chronic respiratory failure with hypoxia: Secondary | ICD-10-CM | POA: Diagnosis not present

## 2021-03-03 DIAGNOSIS — J9612 Chronic respiratory failure with hypercapnia: Secondary | ICD-10-CM

## 2021-03-03 MED ORDER — ATORVASTATIN CALCIUM 20 MG PO TABS: 20.0000 mg | ORAL_TABLET | Freq: Every day | ORAL | 3 refills | Status: DC

## 2021-03-03 NOTE — Patient Instructions (Signed)
Medication Instructions:  Your physician has recommended you make the following change in your medication:  INCREASE: Atorvastatin to 9m daily  *If you need a refill on your cardiac medications before your next appointment, please call your pharmacy*   Lab Work: Your physician recommends that you return for a FASTING lipid profile on 06/03/2021.  If you have labs (blood work) drawn today and your tests are completely normal, you will receive your results only by: [facility](if you have MyChart) OR A paper copy in the mail If you have any lab test that is abnormal or we need to change your treatment, we will call you to review the results.   Follow-Up: At [facility] you and your health needs are our priority.  As part of our continuing mission to provide you with exceptional heart care, we have created designated Provider Care Teams.  These Care Teams include your primary Cardiologist (physician) and Advanced Practice Providers (APPs -  Physician Assistants and Nurse Practitioners) who all work together to provide you with the care you need, when you need it.   Your next appointment:   6 month(s)  The format for your next appointment:   In Person  Provider:   You may see MCandee Furbish MD or one of the following Advanced Practice Providers on your designated Care Team:   LCecilie Kicks NP

## 2021-03-06 ENCOUNTER — Other Ambulatory Visit (INDEPENDENT_AMBULATORY_CARE_PROVIDER_SITE_OTHER): Payer: Self-pay | Admitting: Physician Assistant

## 2021-03-06 DIAGNOSIS — E1169 Type 2 diabetes mellitus with other specified complication: Secondary | ICD-10-CM

## 2021-03-09 ENCOUNTER — Encounter (INDEPENDENT_AMBULATORY_CARE_PROVIDER_SITE_OTHER): Payer: Self-pay

## 2021-03-09 NOTE — Telephone Encounter (Signed)
Message sent to pt-CAS

## 2021-03-09 NOTE — Telephone Encounter (Signed)
Last seen [name] [name]

## 2021-03-27 ENCOUNTER — Inpatient Hospital Stay (HOSPITAL_COMMUNITY): Admission: RE | Admit: 2021-03-27 | Payer: Medicare Other | Source: Ambulatory Visit

## 2021-03-30 ENCOUNTER — Other Ambulatory Visit: Payer: Self-pay

## 2021-03-31 ENCOUNTER — Other Ambulatory Visit: Payer: Self-pay

## 2021-03-31 ENCOUNTER — Ambulatory Visit (HOSPITAL_COMMUNITY)
Admission: RE | Admit: 2021-03-31 | Discharge: 2021-03-31 | Disposition: A | Discharge status: Home / Self Care | Payer: Medicare Other | Source: Ambulatory Visit | Attending: Acute Care | Admitting: Acute Care

## 2021-03-31 DIAGNOSIS — G4733 Obstructive sleep apnea (adult) (pediatric): Secondary | ICD-10-CM | POA: Insufficient documentation

## 2021-03-31 DIAGNOSIS — Z930 Tracheostomy status: Secondary | ICD-10-CM | POA: Diagnosis present

## 2021-03-31 DIAGNOSIS — Z9981 Dependence on supplemental oxygen: Secondary | ICD-10-CM | POA: Insufficient documentation

## 2021-03-31 DIAGNOSIS — J9610 Chronic respiratory failure, unspecified whether with hypoxia or hypercapnia: Secondary | ICD-10-CM | POA: Diagnosis not present

## 2021-03-31 LAB — SARS CORONAVIRUS 2 (TAT 6-24 HRS): SARS Coronavirus 2: NEGATIVE

## 2021-03-31 NOTE — Progress Notes (Signed)
[facility]   Reason for visit:  Planned trach change  HPI:  Well known to me. 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 Thus far really had been intolerant of NIPPV trials. Most recent hospital stay was for decomp OHS and decomp diastolic HF w/ volume overload requiring ventilation. She has since been oxygen dependent but no longer vent dependent since her dc. She has made what looks like significant improvement. Much more functional and seems in much better spirits, She has no new concerns.  ROS  History obtained from the patient General ROS: negative for - chills, fatigue, fever, malaise, or night sweats ENT ROS: negative for - epistaxis, headaches, nasal discharge, sinus pain, sneezing, sore throat, vocal changes, or no discolored trach secretions  Allergy and Immunology ROS: negative Respiratory ROS: no cough, shortness of breath, or wheezing Cardiovascular ROS: no chest pain or dyspnea on exertion Gastrointestinal ROS: no abdominal pain, change in bowel habits, or black or bloody stools Musculoskeletal ROS: negative Dermatological ROS: negative Vital signs:  Reviewed: Pulse Ox mid 90s  Exam:  no distress ENT exam normal, no neck nodes or sinus tenderness, neck without nodes, throat normal without erythema or exudate, and 4 cuffless trach is unremarkable. Stoma w/ old keloid scar but unremarkable otherwise.  clear to auscultation bilaterally regular rate and rhythm Abdomen soft and nontender without distention, masses , no wound infection noted. extremities normal, atraumatic, no cyanosis or edema Alert and oriented x 3, gait normal., reflexes normal and symmetric, strength and  sensation grossly normal  Trach change/procedure:  trach change Shiley size 4 cuffless Wound appearance: unremarkable  Placement confirmed w/ ETCO2      Impression/dx  Chronic respiratory failure  OHS  OSA  MO HTN  Trach dependence  Discussion  Remains trach  dependence. Doubt we will get to the point she can do BIPAP. Just now getting to a point where she is engaged and doing more on her own.  Plan  Cont routine trach care (we can see in about 3 mo.) Cont routine trach care     Visit time: 22 minutes.   Erick Colace ACNP-BC [place]

## 2021-04-01 NOTE — Progress Notes (Addendum)
Tracheostomy Procedure Note  [name] [name] [id number] April 04, 1981  Pre Procedure Tracheostomy Information  Trach Brand: Shiley Size:  6.0   Flex trach [id number] Style: Uncuffed Secured by: Velcro   Procedure: Trach change and trach cleaning    Post Procedure Tracheostomy Information  Trach Brand: Shiley Size:  6.0  Flex trach [id number] Style: Uncuffed Secured by: Velcro   Post Procedure Evaluation:  ETCO2 positive color change from yellow to purple : Yes.   Vital signs:VSS Patients current condition: stable Complications: No apparent complications Trach site exam: clean, dry Wound care done: dry, 4 x 4 Drain gaize Patient did tolerate procedure well.   Education: none  Prescription needs: none    Additional needs: none

## 2021-04-12 ENCOUNTER — Other Ambulatory Visit: Payer: Self-pay | Admitting: Cardiology

## 2021-05-28 ENCOUNTER — Ambulatory Visit: Payer: Medicare Other | Admitting: Nurse Practitioner

## 2021-06-03 ENCOUNTER — Other Ambulatory Visit: Payer: Medicare Other

## 2021-06-11 ENCOUNTER — Telehealth: Payer: Self-pay | Admitting: Nurse Practitioner

## 2021-06-11 NOTE — Telephone Encounter (Signed)
Left message for patient to call back and schedule Medicare Annual Wellness Visit (AWV) in office.   If not able to come in office, please offer to do virtually or by telephone.  Left office number and my jabber [phone].  Last AWV:03/13/2020  Please schedule at anytime with Nurse Health Advisor.

## 2021-06-18 ENCOUNTER — Other Ambulatory Visit: Payer: Self-pay | Admitting: Cardiology

## 2021-06-18 DIAGNOSIS — I10 Essential (primary) hypertension: Secondary | ICD-10-CM

## 2021-06-18 DIAGNOSIS — I5032 Chronic diastolic (congestive) heart failure: Secondary | ICD-10-CM

## 2021-06-29 ENCOUNTER — Other Ambulatory Visit: Payer: Self-pay | Admitting: Acute Care

## 2021-06-29 LAB — SARS CORONAVIRUS 2 (TAT 6-24 HRS): SARS Coronavirus 2: NEGATIVE

## 2021-07-02 ENCOUNTER — Other Ambulatory Visit: Payer: Self-pay

## 2021-07-02 ENCOUNTER — Ambulatory Visit (HOSPITAL_COMMUNITY)
Admission: RE | Admit: 2021-07-02 | Discharge: 2021-07-02 | Disposition: A | Discharge status: Home / Self Care | Payer: Medicare Other | Source: Ambulatory Visit | Attending: Acute Care | Admitting: Acute Care

## 2021-07-02 DIAGNOSIS — G4733 Obstructive sleep apnea (adult) (pediatric): Secondary | ICD-10-CM | POA: Insufficient documentation

## 2021-07-02 DIAGNOSIS — Z930 Tracheostomy status: Secondary | ICD-10-CM | POA: Insufficient documentation

## 2021-07-02 DIAGNOSIS — J9610 Chronic respiratory failure, unspecified whether with hypoxia or hypercapnia: Secondary | ICD-10-CM | POA: Insufficient documentation

## 2021-07-02 NOTE — Progress Notes (Signed)
Tracheostomy Procedure Note  [name] [name] [id number] 1981/07/07  Pre Procedure Tracheostomy Information  Trach Brand: Shiley Size:  6.0 Style: Uncuffed Secured by: Velcro   Procedure: Trach change and trach cleaning    Post Procedure Tracheostomy Information  Trach Brand: Shiley Size:  6.0 Style: Uncuffed Secured by: Velcro   Post Procedure Evaluation:  ETCO2 positive color change from yellow to purple : Yes.   Vital signs:VSS Patients current condition: stable Complications: No apparent complications Trach site exam: clean, dry Wound care done: 4 x 4 gauze drain gauze Patient did tolerate procedure well.   Education: none  Prescription needs: none    Additional needs: Pt given new PMV today and and a new trach collar setup for oxygen to wear at home

## 2021-07-06 NOTE — Progress Notes (Addendum)
[facility]   Reason for visit  Planned trach change  HPI 40 year old female well known to me. 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 She is now Oxygen and trach dependent and has not tolerated attempts at transition to CPAP. Presents for planned trach change   Review of Systems  Constitutional:  Negative for chills, fever, malaise/fatigue and weight loss.  HENT:  Negative for congestion, ear pain, sinus pain and sore throat.   Eyes: Negative.   Respiratory: Negative.  Negative for stridor.   Cardiovascular: Negative.   Gastrointestinal: Negative.   Genitourinary: Negative.   Musculoskeletal: Negative.   Skin: Negative.   Neurological: Negative.   Endo/Heme/Allergies: Negative.   Psychiatric/Behavioral: Negative.     Exam  Pulse ox mid 90s on trach collar  HENT NCAT no JVD trach site unremarkable. Phonation quality is strong voice raspy but this has been her baseline really since trach was placed.  Pulm Clear  Card rrr Abd soft  Ext warm no edema Neuro intact  Procedure  Trach change  The size 6 Shiley was removed. The trach stoma was inspected and looked good. A new size 6 was placed over obturator w/ out difficulty and placement was verified w/ ETCO2. Pt tolerated well  Impression Trach dependence  OSA OHS Chronic respiratory failure   Plan Cont routine trach care  ROV 12 weeks   My time 22 min   Erick Colace ACNP-BC [place] Pager # [phone] OR # [phone] if no answer

## 2021-08-23 ENCOUNTER — Other Ambulatory Visit: Payer: Self-pay | Admitting: Cardiology

## 2021-10-13 ENCOUNTER — Encounter (INDEPENDENT_AMBULATORY_CARE_PROVIDER_SITE_OTHER): Payer: Self-pay | Admitting: Family Medicine

## 2021-10-13 ENCOUNTER — Ambulatory Visit (INDEPENDENT_AMBULATORY_CARE_PROVIDER_SITE_OTHER): Payer: Medicare Other | Admitting: Family Medicine

## 2021-10-13 ENCOUNTER — Other Ambulatory Visit: Payer: Self-pay

## 2021-10-13 VITALS — BP 144/74 | Temp 98.2°F | Ht 64.0 in | Wt >= 6400 oz

## 2021-10-13 DIAGNOSIS — Z6841 Body Mass Index (BMI) 40.0 and over, adult: Secondary | ICD-10-CM

## 2021-10-13 DIAGNOSIS — E668 Other obesity: Secondary | ICD-10-CM

## 2021-10-13 DIAGNOSIS — Z9189 Other specified personal risk factors, not elsewhere classified: Secondary | ICD-10-CM

## 2021-10-13 DIAGNOSIS — I509 Heart failure, unspecified: Secondary | ICD-10-CM | POA: Insufficient documentation

## 2021-10-13 DIAGNOSIS — Z430 Encounter for attention to tracheostomy: Secondary | ICD-10-CM

## 2021-10-13 DIAGNOSIS — E1169 Type 2 diabetes mellitus with other specified complication: Secondary | ICD-10-CM

## 2021-10-13 DIAGNOSIS — R0602 Shortness of breath: Secondary | ICD-10-CM | POA: Diagnosis not present

## 2021-10-13 DIAGNOSIS — R5383 Other fatigue: Secondary | ICD-10-CM

## 2021-10-13 DIAGNOSIS — I5032 Chronic diastolic (congestive) heart failure: Secondary | ICD-10-CM

## 2021-10-13 DIAGNOSIS — Z1331 Encounter for screening for depression: Secondary | ICD-10-CM

## 2021-10-13 DIAGNOSIS — E785 Hyperlipidemia, unspecified: Secondary | ICD-10-CM | POA: Diagnosis not present

## 2021-10-13 DIAGNOSIS — Z6837 Body mass index (BMI) 37.0-37.9, adult: Secondary | ICD-10-CM

## 2021-10-13 DIAGNOSIS — F39 Unspecified mood [affective] disorder: Secondary | ICD-10-CM | POA: Insufficient documentation

## 2021-10-13 NOTE — Progress Notes (Signed)
Office: [phone]  /  Fax: [phone]    Date: October 26, 2021   Appointment Start Time: 11:01am Duration: 31 minutes Provider: Glennie Isle, Psy.D. Type of Session: Intake for Individual Therapy  Location of Patient: Home (private location) Location of Provider: Provider's home (private office) Type of Contact: Telepsychological Visit via MyChart Video Visit  Informed Consent: Prior to proceeding with today's appointment, two pieces of identifying information were obtained. In addition, [name] [name]'s physical location at the time of this appointment was obtained as well a phone number she could be reached at in the event of technical difficulties. [name] [name] and this provider participated in today's telepsychological service.   The provider's role was explained to [employer]. The provider reviewed and discussed issues of confidentiality, privacy, and limits therein (e.g., reporting obligations). In addition to verbal informed consent, written informed consent for psychological services was obtained prior to the initial appointment. Since the clinic is not a 24/7 crisis center, mental health emergency resources were shared and this  provider explained MyChart, e-mail, voicemail, and/or other messaging systems should be utilized only for non-emergency reasons. This provider also explained that information obtained during appointments will be placed in [facility] record and relevant information will be shared with other providers at [facility] for coordination of care. [name] [name] agreed information may be shared with other [facility] providers as needed for coordination of care and by signing the service agreement document, she provided written consent for coordination of care. Prior to initiating telepsychological services, [name] [name] completed an informed consent document, which included the development of a safety plan (i.e., an emergency contact and emergency  resources) in the event of an emergency/crisis. [name] [name] verbally acknowledged understanding she is ultimately responsible for understanding her insurance benefits for telepsychological and in-person services. This provider also reviewed confidentiality, as it relates to telepsychological services, as well as the rationale for telepsychological services (i.e., to reduce exposure risk to COVID-19). [name] [name]  acknowledged understanding that appointments cannot be recorded without both party consent and she is aware she is responsible for securing confidentiality on her end of the session. [name] [name] verbally consented to proceed. Of note, today's appointment was switched to a regular telephone call at 11:17am with [name] [name]'s verbal consent due to technical issues.   Chief Complaint/HPI: [name] [name] was referred by Dr. 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 Mellody Dance on October 13, 2021, "[name] [name] has been on Celexa for 1.5 years now since her last hospitalization in 01/2020. She doesn't have a counselor and she doesn't feel that she needs one. She denies depression currently.  Denies need for medication change" The note for the initial appointment further indicated the following: "[name] [name]'s habits were reviewed today and are as follows: Her family eats meals together, she has been heavy most of her life, she started gaining weight after high school, her heaviest weight ever was 400 pounds, she is a picky eater and doesn't like to eat healthier foods, she has significant food cravings issues, she snacks frequently in the evenings, she skips meals frequently, she is frequently drinking liquids with calories, she frequently makes poor food choices, she frequently eats larger portions than normal, and she struggles with emotional eating." [name] [name]'s Food and Mood (modified PHQ-9) score on October 13, 2021 was 11.  During  today's appointment, [name] [name] reported feeling "down" since her hospitalization in 2017 (trach), noting an increase in emotional  eating behaviors since the aforementioned. She was verbally administered a questionnaire assessing various behaviors related to emotional eating behaviors. [name] [name] endorsed the following: overeat when you are celebrating, eat certain foods when you are anxious, stressed, depressed, or your feelings are hurt, use food to help you cope with emotional situations, overeat when you are angry or upset, overeat frequently when you are bored or lonely, not worry about what you eat when you are in a good mood, and overeat when you are alone, but eat much less when you are with other people. Kaydon believes the onset of emotional eating behaviors was likely during the last 10 years and described the current frequency of emotional eating behaviors as "monthly," adding she is unsure. In addition, [name] [name] denied a history of binge eating behaviors. [name] [name] denied a history of significantly restricting food intake, purging and engagement in other compensatory strategies, and has never been diagnosed with an eating disorder. She also denied a history of treatment for emotional eating. She discussed a history of skipping meals and snacking. Furthermore, [name] [name] denied other problems of concern.    Mental Status Examination:  Appearance: neat Behavior: appropriate to circumstances Mood: neutral Affect: mood congruent Speech: WNL Eye Contact: appropriate Psychomotor Activity: WNL Gait: unable to assess  Thought Process: linear, logical, and goal directed and denies suicidal, homicidal, and self-harm ideation, plan and intent  Thought Content/Perception: no hallucinations, delusions, bizarre thinking or behavior endorsed or observed Orientation: AAOx4 Memory/Concentration: memory, attention, language, and fund of knowledge intact  Insight/Judgment: fair  Family & Psychosocial History:  [name] [name] reported she is not in a relationship and she has one daughter (age 62). She indicated she is currently not employed. Additionally, [name] [name] shared her highest level of education obtained is "2.5 years of college." Currently, [name] [name]'s social support system consists of her mother, adding her family is a "great support system." Moreover, [name] [name] stated she resides with her mother, daughter, and step-father.   Medical History:  Past Medical History:  Diagnosis Date   Acute on chronic respiratory failure with hypoxia ([place]) 02/18/2020   Chronic diastolic heart failure ([place])    Echo 10/12: EF 50-55 // b. Echo 10/17: EF 50-55, mild BAE, PASP 42 // Echo 10/18: Mild concentric LVH, EF 55-60, normal wall motion, mild MAC, severe LAE, mild TR, PASP 15, trivial pericardial effusion   CKD (chronic kidney disease) stage 4, GFR 15-29 ml/min (HCC) 10/25/2016   admitted in 2/18 with SCr of 10 in the setting of dehydration from illness, etc >> improved to 5 at DC   CKD (chronic kidney disease) stage 4, GFR 15-29 ml/min (HCC) 10/25/2016   DM2 (diabetes mellitus, type 2) (HCC)    A1c 7.2 in 05/2016   Gestational diabetes mellitus in pregnancy 05/24/2013   HCAP (healthcare-associated pneumonia)    HLD (hyperlipidemia)    HTN (hypertension)    HTN in pregnancy, chronic 05/24/2013   Hypertensive heart disease with CHF (congestive heart failure) ([place]) 05/25/2016   Lower extremity edema    Morbid obesity (HCC)    OSA (obstructive sleep apnea) 07/09/2011   s/p Trach 2/2 prolonged respiratory failure during admx for CHF in 05/2016   Prediabetes    Pulmonary hypertension ([place])    secondary from CHF/OSA   Sinus tachycardia    SOB (shortness of breath)    Past Surgical History:  Procedure Laterality Date   TRACHEOSTOMY TUBE PLACEMENT N/A 06/11/2016   Procedure: TRACHEOSTOMY;  Surgeon: Melida Quitter, MD;  Location: [facility];  Service: ENT;  Laterality:  N/A;   Current Outpatient Medications on File Prior to Visit   Medication Sig Dispense Refill   amLODipine (NORVASC) 5 MG tablet TAKE 1 TABLET (5 MG TOTAL) BY MOUTH DAILY. 90 tablet 1   atorvastatin (LIPITOR) 20 MG tablet Take 1 tablet (20 mg total) by mouth daily. 90 tablet 3   carvedilol (COREG) 25 MG tablet TAKE 1 TABLET BY MOUTH TWICE A DAY 180 tablet 2   citalopram (CELEXA) 10 MG tablet Take 1 tablet (10 mg total) by mouth daily. 90 tablet 3   furosemide (LASIX) 40 MG tablet TAKE 1 TABLET BY MOUTH EVERY DAY 90 tablet 2   hydrALAZINE (APRESOLINE) 100 MG tablet TAKE 1 TABLET BY MOUTH 3 TIMES DAILY. 270 tablet 2   isosorbide dinitrate (ISORDIL) 30 MG tablet TAKE 1 TABLET BY MOUTH 3 TIMES DAILY. 270 tablet 2   No current facility-administered medications on file prior to visit.  Medication compliant.   Mental Health History: [name] [name] reported she has never attended therapeutic services. She indicated her PCP currently prescribes Celexa, adding it was first prescribed by her "trach doctor" while she was hospitalized in 2021. [name] [name] reported there is no history of hospitalizations for psychiatric concerns. [name] [name] denied a family history of mental health/substance abuse related concerns. [name] [name] reported there is no history of trauma including psychological, physical , and sexual abuse, as well as neglect.   [name] [name] described her typical mood lately as "pretty good," adding she is focusing on "getting back on track with [her] diet plan." [name] [name] denied current alcohol use. She denied tobacco use. She denied illicit/recreational substance use. She also denied current caffeine intake. Furthermore, [name] [name] indicated she is not experiencing the following: hallucinations and delusions, paranoia, symptoms of mania , social withdrawal, crying spells, panic attacks, memory concerns, attention and concentration issues, and obsessions and compulsions. She also denied history of and current suicidal ideation, plan, and intent; history of and current homicidal ideation,  plan, and intent; and history of and current engagement in self-harm.  The following strengths were reported by Shanise: very outgoing; very helpful; and caring. The following strengths were observed by this provider: ability to express thoughts and feelings during the therapeutic session, ability to establish and benefit from a therapeutic relationship, willingness to work toward established goal(s) with the clinic and ability to engage in reciprocal conversation.   Legal History: [name] [name] reported there is no history of legal involvement.   Structured Assessments Results: The Patient Health Questionnaire-9 (PHQ-9) is a self-report measure that assesses symptoms and severity of depression over the course of the last two weeks. Caniya obtained a score of 0. [0= Not at all; 1= Several days; 2= More than half the days; 3= Nearly every day] Little interest or pleasure in doing things 0  Feeling down, depressed, or hopeless 0  Trouble falling or staying asleep, or sleeping too much 0  Feeling tired or having little energy 0  Poor appetite or overeating 0  Feeling bad about yourself --- or that you are a failure or have let yourself or your family down 0  Trouble concentrating on things, such as reading the newspaper or watching television 0  Moving or speaking so slowly that other people could have noticed? Or the opposite --- being so fidgety or restless that you have been moving around a lot more than usual 0  Thoughts that you would be better off dead or hurting yourself in some way 0  PHQ-9 Score 0    The Generalized Anxiety Disorder-7 (  GAD-7) is a brief self-report measure that assesses symptoms of anxiety over the course of the last two weeks. Chavon obtained a score of 0. [0= Not at all; 1= Several days; 2= Over half the days; 3= Nearly every day] Feeling nervous, anxious, on edge 0  Not being able to stop or control worrying 0  Worrying too much about different things 0  Trouble relaxing  0  Being so restless that it's hard to sit still 0  Becoming easily annoyed or irritable 0  Feeling afraid as if something awful might happen 0  GAD-7 Score 0   Interventions:  Conducted a chart review Focused on rapport building Verbally administered PHQ-9 and GAD-7 for symptom monitoring Verbally administered Food & Mood questionnaire to assess various behaviors related to emotional eating Provided emphatic reflections and validation Collaborated with patient on a treatment goal  Psychoeducation provided regarding physical versus emotional hunger  Provisional DSM-5 Diagnosis(es): F50.89 Other Specified Feeding or Eating Disorder, Emotional Eating Behaviors  Plan: Basma appears able and willing to participate as evidenced by collaboration on a treatment goal, engagement in reciprocal conversation, and asking questions as needed for clarification. The next appointment will be scheduled in two weeks, which will be via MyChart Video Visit. The following treatment goal was established: increase coping skills. This provider will regularly review the treatment plan and medical chart to keep informed of status changes. Meklit expressed understanding and agreement with the initial treatment plan of care. Journe will be sent a handout via e-mail to utilize between now and the next appointment to increase awareness of hunger patterns and subsequent eating. Verlie provided verbal consent during today's appointment for this provider to send the handout via e-mail.

## 2021-10-14 LAB — COMPREHENSIVE METABOLIC PANEL
ALT: 19 IU/L (ref 0–32)
AST: 20 IU/L (ref 0–40)
Albumin/Globulin Ratio: 1.1 — ABNORMAL LOW (ref 1.2–2.2)
Albumin: 4.2 g/dL (ref 3.8–4.8)
Alkaline Phosphatase: 118 IU/L (ref 44–121)
BUN/Creatinine Ratio: 14 (ref 9–23)
BUN: 12 mg/dL (ref 6–24)
Bilirubin Total: 0.4 mg/dL (ref 0.0–1.2)
CO2: 31 mmol/L — ABNORMAL HIGH (ref 20–29)
Calcium: 9.3 mg/dL (ref 8.7–10.2)
Chloride: 95 mmol/L — ABNORMAL LOW (ref 96–106)
Creatinine, Ser: 0.88 mg/dL (ref 0.57–1.00)
Globulin, Total: 3.7 g/dL (ref 1.5–4.5)
Glucose: 135 mg/dL — ABNORMAL HIGH (ref 70–99)
Potassium: 4.1 mmol/L (ref 3.5–5.2)
Sodium: 140 mmol/L (ref 134–144)
Total Protein: 7.9 g/dL (ref 6.0–8.5)
eGFR: 84 mL/min/{1.73_m2} (ref >59)

## 2021-10-14 LAB — CBC WITH DIFFERENTIAL/PLATELET
Basophils Absolute: 0 10*3/uL (ref 0.0–0.2)
Basos: 1 %
EOS (ABSOLUTE): 0.3 10*3/uL (ref 0.0–0.4)
Eos: 5 %
Hematocrit: 35.2 % (ref 34.0–46.6)
Hemoglobin: 11.5 g/dL (ref 11.1–15.9)
Immature Grans (Abs): 0 10*3/uL (ref 0.0–0.1)
Immature Granulocytes: 0 %
Lymphocytes Absolute: 0.7 10*3/uL (ref 0.7–3.1)
Lymphs: 11 %
MCH: 29 pg (ref 26.6–33.0)
MCHC: 32.7 g/dL (ref 31.5–35.7)
MCV: 89 fL (ref 79–97)
Monocytes Absolute: 0.5 10*3/uL (ref 0.1–0.9)
Monocytes: 7 %
Neutrophils Absolute: 5 10*3/uL (ref 1.4–7.0)
Neutrophils: 76 %
Platelets: 260 10*3/uL (ref 150–450)
RBC: 3.96 x10E6/uL (ref 3.77–5.28)
RDW: 13.3 % (ref 11.7–15.4)
WBC: 6.6 10*3/uL (ref 3.4–10.8)

## 2021-10-14 LAB — LIPID PANEL WITH LDL/HDL RATIO
Cholesterol, Total: 184 mg/dL (ref 100–199)
HDL: 59 mg/dL (ref >39)
LDL Chol Calc (NIH): 108 mg/dL — ABNORMAL HIGH (ref 0–99)
LDL/HDL Ratio: 1.8 ratio (ref 0.0–3.2)
Triglycerides: 96 mg/dL (ref 0–149)
VLDL Cholesterol Cal: 17 mg/dL (ref 5–40)

## 2021-10-14 LAB — INSULIN, RANDOM: INSULIN: 23.6 u[IU]/mL (ref 2.6–24.9)

## 2021-10-14 LAB — HEMOGLOBIN A1C
Est. average glucose Bld gHb Est-mCnc: 128 mg/dL
Hgb A1c MFr Bld: 6.1 % — ABNORMAL HIGH (ref 4.8–5.6)

## 2021-10-14 LAB — T4, FREE: Free T4: 1.38 ng/dL (ref 0.82–1.77)

## 2021-10-14 LAB — TSH: TSH: 1.68 u[IU]/mL (ref 0.450–4.500)

## 2021-10-14 LAB — T3: T3, Total: 152 ng/dL (ref 71–180)

## 2021-10-14 NOTE — Progress Notes (Signed)
Chief Complaint:   OBESITY [name] [name] (MR# [id number]) is a 41 y.o. female who presents for evaluation and treatment of obesity and related comorbidities. Current BMI is Body mass index is 74.15 kg/m. [name] [name] has been struggling with her weight for many years and has been unsuccessful in either losing weight, maintaining weight loss, or reaching her healthy weight goal.  This is [name] [name]'s first office visit. She was here prior, and her last office visit was on 01/28/2021 more than 8 months ago.  She was seen by other providers previously. She states she wasn't ready mentally last office visit, and she wasn't following her plan closely enough. Last time she was on Category 2 with calculated BMR last office visit of 2023. Now has increased slightly to 2098. With currently having a tracheotomy, she is unable to use the indirect calorimeter. She is on O2 at 2-3 liters nasal cannula by day and 4 liters nasal cannula at night.  She does also suffer from severe sleep apnea as well but is trached.   She lives with her mother [name] [name], daughter [name] [name] and stepfather [name] [name] currently.  She does not work at all, and is considered disabled with Medicare and Medicaid support.  [name] [name] is currently in the action stage of change and ready to dedicate time achieving and maintaining a healthier weight. [name] [name] is interested in becoming our patient and working on intensive lifestyle modifications including (but not limited to) diet and exercise for weight loss.  [name] [name]'s habits were reviewed today and are as follows: Her family eats meals together, she has been heavy most of her life, she started gaining weight after high school, her heaviest weight ever was 400 pounds, she is a picky eater and doesn't like to eat healthier foods, she has significant food cravings issues, she snacks frequently in the evenings, she skips meals frequently, she is frequently drinking liquids with calories, she frequently makes  poor food choices, she frequently eats larger portions than normal, and she struggles with emotional eating.  Depression Screen [name] [name]'s Food and Mood (modified PHQ-9) score was 11.  Depression screen [facility] 2/9 10/13/2021  Decreased Interest 2  Down, Depressed, Hopeless 1  PHQ - 2 Score 3  Altered sleeping 1  Tired, decreased energy 2  Change in appetite 1  Feeling bad or failure about yourself  2  Trouble concentrating 2  Moving slowly or fidgety/restless 0  Suicidal thoughts 0  PHQ-9 Score 11  Difficult doing work/chores Not difficult at all  Some recent data might be hidden     Subjective:   1. Other fatigue [name] [name] admits to daytime somnolence and admits to waking up still tired. Patient has a history of symptoms of daytime fatigue and morning fatigue. [name] [name] generally gets 5 or 6 hours of sleep per night, and states that she has nightime awakenings. Snoring is present. Apneic episodes are present. Epworth Sleepiness Score is 2.  She does have a history of sleep apnea in which she is currently trached.   2. SOB (shortness of breath) on exertion [name] [name] notes increasing shortness of breath with exercising and seems to be worsening over time with weight gain. She notes getting out of breath sooner with activity than she used to. This has not gotten worse recently. [name] denies shortness of breath at rest or orthopnea.   3. Type 2 diabetes mellitus with other specified complication, without long-term current use of insulin ([name] [name]) [name] [name] was first diagnosed in 05/2016 with A1c 7.9. She is  not on medications for her diabetes currently.  Most recent A1c in 11/2020 was 5.9 and she was on Ozempic at that time by [name] [name].   Off Ozempic around 02/27/2021.  No other medications given to patient.   4. Hyperlipidemia associated with type 2 diabetes mellitus ([name] [name]) [name] [name] cardiology team last increased dose of her Lipitor in July 2022 from 10 mg to 20 mg.  She was lost to follow up and never had  repeat labs.  She does not follow a low saturated / trans fat diet currently.   5. 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 Marlou Porch and associates; extensive notes were reviewed together.  Last seen by them on 03/03/2021.  Symptoms are stable and she is without concerns today.  She does not particularly watch her salt intake even though she knows she needs to.    6. 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 She is on 2-3 liters of oxygen continuously during the day and 4 liters at night which is managed by pulmonology.  Those extensive notes were reviewed together.  Last seen by Pulmonary 02/12/2021.   7. 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 She doesn't have a counselor and she doesn't feel that she needs one. She denies depression currently.  Denies need for medication change   8. At risk for deficient knowledge of diabetes mellitus [name] [name] is at higher than average risk for deficient knowledge of diabetes mellitus due to patient did not even identify as having a history of diabetes mellitus.    Assessment/Plan:   Orders Placed This Encounter  Procedures   CBC with Differential/Platelet   Comprehensive metabolic panel   Hemoglobin A1c   Insulin, random   Lipid Panel With LDL/HDL Ratio   T3   T4, free   TSH     1. Other fatigue [name] [name] does feel that her weight is causing her energy to be lower than it should be. Fatigue may be related to obesity, depression or many other causes especially with her congestive heart failure and tracheotomy.  Labs will be ordered, and in the meanwhile, [name] will focus on self care including making healthy food choices, increasing physical activity and focusing on stress  reduction.  2. SOB (shortness of breath) on exertion [name] [name] does feel that she gets out of breath more easily that she used to when she exercises. Amarra's shortness of breath appears to be obesity related and exercise induced as well as possibly due to her congestive heart failure. She has agreed to work on weight loss and gradually increase exercise to treat her exercise induced shortness of breath. Will continue to monitor closely.  3. Type 2 diabetes mellitus with other specified complication, without long-term current use of insulin (HCC) We will check labs today. [name] [name] will decrease simple carbohydrates. Extensive education was done in regards to diagnosis and disease process as well as treatment plans. - Hemoglobin A1c - Insulin, random  4. Hyperlipidemia associated with type 2 diabetes mellitus [facility] [place] cardiology team Dr. Marlou [facility], they last increased dose of her Lipitor in July 2022 from 10 mg to 20 mg.  We will check labs today.  [name] [name] will start her prudent nutritional plan with low saturated and trans fats.  I recommend she make a  follow-up with them as she was told to do - Comprehensive metabolic panel - Lipid Panel With LDL/HDL Ratio  5. 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  I recommend she check her blood pressures at home.  Her medications are managed by cardiology and /or her PCP.    We will check labs today. [name] [name] will continue with heart care per Dr. Marlou Porch and her cardiology team.  Her symptoms are stable currently but she needs to make a follow up appointment. - CBC with Differential/Platelet - Comprehensive metabolic panel  6. 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 [name] [name] will continue her trach care per Pulmonology.  I recommend she make a follow-up office visit with them in the timely manner that they recommended she  do.  7. 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  We will send a referral to Dr. Mallie Mussel, our Bariatric Psychologist for evaluation.  [name] [name] will continue her medications per her primary care provider.  May need additional counseling, but will work with Dr. Mallie Mussel as patient feels she need help with her emotional eating habits.  8. Depression screening [name] [name] had a positive depression screening. Depression is commonly associated with obesity and often results in emotional eating behaviors. We will monitor this closely and work on CBT with our psychologist to help improve the non-hunger eating patterns. Referral to Psychology may be required if no improvement is seen as she continues in our clinic.  I will defer to our psychology specialist, Dr. Mallie Mussel regarding this.  9. At risk for deficient knowledge of diabetes mellitus - [name] [name] was given diabetes prevention education and counseling today of more than 23 minutes.  - Counseled patient on pathophysiology of disease and meaning/ implication of lab results.  - Reviewed how certain foods can either stimulate or inhibit insulin release, and subsequently affect hunger pathways  - Importance of following a healthy meal plan with limiting amounts of simple carbohydrates discussed with patient - Effects of regular aerobic exercise on blood sugar regulation reviewed and encouraged an eventual goal of 30 min 5d/week or more as a minimum.  - Briefly discussed treatment options, which always include dietary and lifestyle modification as first line.   - Handouts provided at patient's desire and/or told to go online to the American Diabetes Association website for further information.   10. Class 2 severe obesity with serious comorbidity and body mass index (BMI) of 37.0 to 37.9 in adult, unspecified obesity  type (HCC) [name] [name] is currently in the action stage of change and her goal is to continue with weight loss efforts. I recommend Yannely begin the structured treatment plan as follows:  She has agreed to the Category 2 Plan.  Exercise goals: As is.   Behavioral modification strategies: increasing lean protein intake, decreasing simple carbohydrates, no skipping meals, and planning for success.  She was informed of the importance of frequent follow-up visits, every 2 weeks, to maximize her success with intensive lifestyle modifications for her multiple health conditions.  She was informed we would discuss her lab results at her next visit unless there is a critical issue that needs to be addressed sooner.  [name] [name] agreed to keep her next visit at the agreed upon time to discuss these results.   Objective:  Blood pressure (!) 144/74, temperature 98.2 F (36.8 C), temperature source Other (Comment), height 5' 4" (1.626 m), weight (!) 432 lb (196 kg), SpO2 96 %, unknown if currently breastfeeding. Body mass index is 74.15 kg/m.  EKG: Normal sinus rhythm, rate (Unable to obtain).  Indirect Calorimeter completed today shows a VO2 of (unable to obtain) and a REE of (unable to obtain).  Her calculated basal metabolic rate is 3557 thus her basal metabolic rate is  (unknown)  than expected.  General: Cooperative, alert, well developed, in no acute distress. HEENT: Conjunctivae and lids unremarkable. Cardiovascular: Regular rhythm.  Lungs: Normal work of breathing. Neurologic: No focal deficits.   Lab Results  Component Value Date   CREATININE 0.88 10/13/2021   BUN 12 10/13/2021   NA 140 10/13/2021   K 4.1 10/13/2021   CL 95 (L) 10/13/2021   CO2 31 (H) 10/13/2021   Lab Results  Component Value Date   ALT 19 10/13/2021   AST 20 10/13/2021   ALKPHOS 118 10/13/2021   BILITOT 0.4 10/13/2021   Lab Results  Component Value Date   HGBA1C 6.1 (H) 10/13/2021   HGBA1C 5.9 11/26/2020    HGBA1C 5.5 06/04/2020   HGBA1C 6.2 11/20/2019   HGBA1C 6.2 09/15/2016   Lab Results  Component Value Date   INSULIN 23.6 10/13/2021   Lab Results  Component Value Date   TSH 1.680 10/13/2021   Lab Results  Component Value Date   CHOL 184 10/13/2021   HDL 59 10/13/2021   LDLCALC 108 (H) 10/13/2021   LDLDIRECT 99.0 09/15/2016   TRIG 96 10/13/2021   CHOLHDL 4 11/26/2020   Lab Results  Component Value Date   WBC 6.6 10/13/2021   HGB 11.5 10/13/2021   HCT 35.2 10/13/2021   MCV 89 10/13/2021   PLT 260 10/13/2021   Lab Results  Component Value Date   IRON 54 09/19/2016   TIBC 227 (L) 09/19/2016   Attestation Statements:   Reviewed by clinician on day of visit: allergies, medications, problem list, medical history, surgical history, family history, social history, and previous encounter notes.  Wilhemena Durie, am acting as transcriptionist for [employer], DO.  I have reviewed the above documentation for accuracy and completeness, and I agree with the above. -Marjory Sneddon, D.O.  The [place] was signed into law in 2016 which includes the topic of electronic health records.  This provides immediate access to information in MyChart.  This includes consultation notes, operative notes, office notes, lab results and pathology reports.  If you have any questions about what you read please let [name] [name] know at your next visit so we can discuss your concerns and take corrective action if need be.  We are right here with you.

## 2021-10-26 ENCOUNTER — Telehealth (INDEPENDENT_AMBULATORY_CARE_PROVIDER_SITE_OTHER): Payer: Medicare Other | Admitting: Psychology

## 2021-10-26 DIAGNOSIS — F5089 Other specified eating disorder: Secondary | ICD-10-CM

## 2021-10-26 NOTE — Progress Notes (Incomplete)
Office: [phone]  /  Fax: [phone]    Date: 11/09/2021   Appointment Start Time: *** Duration: *** minutes Provider: Glennie Isle, Psy.D. Type of Session: Individual Therapy  Location of Patient: {gbptloc:23249} Location of Provider: Provider's Home (private office) Type of Contact: Telepsychological Visit via MyChart Video Visit  Session Content: [name] [name] is a 41 y.o. female presenting for a follow-up appointment to address the previously established treatment goal of increasing coping skills.Today's appointment was a telepsychological visit due to COVID-19. [name] provided verbal consent for today's telepsychological appointment and she is aware she is responsible for securing confidentiality on her end of the session. Prior to proceeding with today's appointment, [name]'s physical location at the time of this appointment was obtained as well a phone number she could be reached at in the event of technical difficulties. [name] and this provider participated in today's telepsychological service.   This provider conducted a brief check-in. *** [name] was receptive to today's appointment as evidenced by openness to sharing, responsiveness to feedback, and {gbreceptiveness:23401}.  Mental Status Examination:  Appearance: {Appearance:22431} Behavior: {Behavior:22445} Mood: {gbmood:21757} Affect: {Affect:22436} Speech: {Speech:22432} Eye Contact: {Eye Contact:22433} Psychomotor Activity: {Motor Activity:22434} Gait: {gbgait:23404} Thought Process: {thought process:22448}  Thought Content/Perception: {disturbances:22451} Orientation: {Orientation:22437} Memory/Concentration: {gbcognition:22449} Insight: {Insight:22446} Judgment: {Insight:22446}  Interventions:  {Interventions for Progress Notes:23405}  DSM-5 Diagnosis(es): F50.89 Other Specified Feeding or Eating Disorder, Emotional Eating Behaviors  Treatment Goal & Progress: During the initial appointment with this  provider, the following treatment goal was established: increase coping skills. [name] has demonstrated progress in her goal as evidenced by {gbtxprogress:22839}. [name] also {gbtxprogress2:22951}.  Plan: The next appointment will be scheduled in {gbweeks:21758}, which will be {gbtxmodality:23402}. The next session will focus on {Plan for Next Appointment:23400}.

## 2021-10-27 ENCOUNTER — Ambulatory Visit (INDEPENDENT_AMBULATORY_CARE_PROVIDER_SITE_OTHER): Payer: Medicare Other | Admitting: Family Medicine

## 2021-10-29 ENCOUNTER — Other Ambulatory Visit: Payer: Self-pay

## 2021-10-29 ENCOUNTER — Ambulatory Visit (INDEPENDENT_AMBULATORY_CARE_PROVIDER_SITE_OTHER): Payer: Medicare Other | Admitting: Family Medicine

## 2021-10-29 ENCOUNTER — Encounter (INDEPENDENT_AMBULATORY_CARE_PROVIDER_SITE_OTHER): Payer: Self-pay | Admitting: Family Medicine

## 2021-10-29 VITALS — BP 162/78 | HR 78 | Temp 98.4°F | Ht 64.0 in | Wt >= 6400 oz

## 2021-10-29 DIAGNOSIS — E785 Hyperlipidemia, unspecified: Secondary | ICD-10-CM

## 2021-10-29 DIAGNOSIS — Z6841 Body Mass Index (BMI) 40.0 and over, adult: Secondary | ICD-10-CM | POA: Diagnosis not present

## 2021-10-29 DIAGNOSIS — F39 Unspecified mood [affective] disorder: Secondary | ICD-10-CM

## 2021-10-29 DIAGNOSIS — I110 Hypertensive heart disease with heart failure: Secondary | ICD-10-CM | POA: Diagnosis not present

## 2021-10-29 DIAGNOSIS — E669 Obesity, unspecified: Secondary | ICD-10-CM | POA: Diagnosis not present

## 2021-10-29 DIAGNOSIS — E1169 Type 2 diabetes mellitus with other specified complication: Secondary | ICD-10-CM

## 2021-11-02 NOTE — Progress Notes (Signed)
Chief Complaint:   OBESITY [name] [name] is here to discuss her progress with her obesity treatment plan along with follow-up of her obesity related diagnoses. [name] [name] is on the Category 2 Plan and states she is following her eating plan approximately 95% of the time. [name] [name] states she is not exercising regularly.  Today's visit was #: 2 Starting weight: 432 lbs Starting date: 10/13/2021 Today's weight: 411 lbs Today's date: 10/29/2021 Total lbs lost to date: 21 lbs Total lbs lost since last in-office visit: 21 lbs  Interim History: [name] [name] is here today for her first follow-up office visit since starting the program with me.  All blood work/ lab tests that were recently ordered by myself or an outside provider were reviewed with patient today per their request.   Extended time was spent counseling her on all new disease processes that were discovered or preexisting ones that are affected by BMI.  she understands that many of these abnormalities will need to monitored regularly along with the current treatment plan of prudent dietary changes, in which we are making each and every office visit, to improve these health parameters.    [name] [name] was put on the Category 2 meal plan at last office visit because she "doesn't eat much" and only ate 2 meals/ day prior.   She denies hunger.  She endorses some cravings, but says snacks fulfill these cravings.  She has two 100-calorie Pop Tarts per day.  PLAN:  [name] [name] lost too much weight, so we will increase to Category 3 for next OV.  Extensive counseling on metabolism and ways to increase it through the foods she eats was performed.    Subjective:   1. Type 2 diabetes mellitus with other specified complication, without long-term current use of insulin (HCC) Discussed labs with patient today.  -A1c and FI both elevated but A1c well controlled at 6.1. - diet controlled currently  [name] [name] has no family history of thyroid cancer or personal  history of pancreatitis.  She was on Ozempic in the past.  No cravings for carbs and no hunger today.   2. Hyperlipidemia associated with type 2 diabetes mellitus (HCC) Discussed labs with patient today.   -LDL not at goal of <70.    [name] [name] is tolerating medication(s) well without side effects.  Medication compliance is good and patient appears to be taking it as prescribed.  Denies additional concerns regarding this condition. She is taking Lipitor 20 mg daily.   3. Hypertensive heart disease with congestive heart failure, unspecified heart failure type [facility]) Discussed labs with patient today.   -BP not at goal  [name] [name] is on Norvasc 5 mg daily, Coreg 25 mg BID, hydralazine 100 mg TID, and Isordil.30 mg TID.   4. Mood disorder (HCC) with emotional eating [name] [name] had a visit with Dr. Dewaine Conger; went well.  She is on Celexa 10 mg daily. NO SI/ mood stable   Assessment/Plan:   1. Type 2 diabetes mellitus with other specified complication, without long-term current use of insulin (HCC) - Worsening, as prior it was 5.9 in 11/2020 - Continue prudent nutritional plan, decrease simple carbs and increase intake of lean proteins and complex carbs.   - Cont w/ wt loss - [name] [name] has no family history of thyroid cancer or personal history of pancreatitis.  She was on Ozempic in the past but HAS No cravings for carbs and No hunger today.   We will monitor and consider medications in the future as  needed.     2. Hyperlipidemia associated with type 2 diabetes mellitus (HCC) LDL not at goal of <70.  - She will discuss whether or not a dose increase is warranted with her cardiologist and/or PCP- only on 20mg  Lipitor and compliance is good.    - Prior checked with LDL at 105 in 11/2020 and per pt, no dose change at that time was done - From our standpoint, continue with lifestyle changes and continue prudent nutritional plan:  decrease saturated/trans fats, and continue w/weight  loss.   3. Hypertensive heart disease with congestive heart failure, unspecified heart failure type (HCC) BP elevated today. Not at goal.   - Check BP at home and bring in BP log.    - Pt will decrease salt intake, water intake in ounces per day per Cardiology.  Continue medications per Cardiology.  We will closely monitor alongside CARDS.    4. Mood disorder (HCC) with emotional eating [name] [name] is scheduled for her second office visit with Dr. Dewaine Conger.  Stable mood.  She feels really proud of herself.   5. Obesity with current BMI of 70.7 [name] [name] is currently in the action stage of change. As such, her goal is to continue with weight loss efforts. She has agreed to CHANGE TO the Category 3 Plan.   Exercise goals:  As is.  Behavioral modification strategies: increasing lean protein intake, increasing water intake, and meal planning and cooking strategies.  [name] [name] has agreed to follow-up with our clinic in 2 weeks with [name] Limbo, FNP, and in 4 weeks with me.  She was informed of the importance of frequent follow-up visits to maximize her success with intensive lifestyle modifications for her multiple health conditions.    Objective:   Blood pressure (!) 162/78, pulse 78, temperature 98.4 F (36.9 C), height 5\' 4"  (1.626 m), weight (!) 411 lb (186.4 kg), SpO2 96 %, unknown if currently breastfeeding. Body mass index is 70.55 kg/m.  General: Cooperative, alert, well developed, in no acute distress. HEENT: Conjunctivae and lids unremarkable. Cardiovascular: Regular rhythm.  Lungs: Normal work of breathing. Neurologic: No focal deficits.   Lab Results  Component Value Date   CREATININE 0.88 10/13/2021   BUN 12 10/13/2021   NA 140 10/13/2021   K 4.1 10/13/2021   CL 95 (L) 10/13/2021   CO2 31 (H) 10/13/2021   Lab Results  Component Value Date   ALT 19 10/13/2021   AST 20 10/13/2021   ALKPHOS 118 10/13/2021   BILITOT 0.4 10/13/2021   Lab Results  Component  Value Date   HGBA1C 6.1 (H) 10/13/2021   HGBA1C 5.9 11/26/2020   HGBA1C 5.5 06/04/2020   HGBA1C 6.2 11/20/2019   HGBA1C 6.2 09/15/2016   Lab Results  Component Value Date   INSULIN 23.6 10/13/2021   Lab Results  Component Value Date   TSH 1.680 10/13/2021   Lab Results  Component Value Date   CHOL 184 10/13/2021   HDL 59 10/13/2021   LDLCALC 108 (H) 10/13/2021   LDLDIRECT 99.0 09/15/2016   TRIG 96 10/13/2021   CHOLHDL 4 11/26/2020   Lab Results  Component Value Date   WBC 6.6 10/13/2021   HGB 11.5 10/13/2021   HCT 35.2 10/13/2021   MCV 89 10/13/2021   PLT 260 10/13/2021   Lab Results  Component Value Date   IRON 54 09/19/2016   TIBC 227 (L) 09/19/2016     Obesity Behavioral Intervention:   Approximately 15 minutes were spent on the discussion below.  ASK: We discussed the diagnosis of obesity with [name] [name] today and [name] [name] agreed to give [place] permission to discuss obesity behavioral modification therapy today.  ASSESS: Catalia has the diagnosis of obesity and her BMI today is 70.7.  Angeliah is in the action stage of change.   ADVISE: Camia was educated on the multiple health risks of obesity as well as the benefit of weight loss to improve her health. She was advised of the need for long term treatment and the importance of lifestyle modifications to improve her current health and to decrease her risk of future health problems.  AGREE: Multiple dietary modification options and treatment options were discussed and Mickenzie agreed to follow the recommendations documented in the above note.  ARRANGE: Rhaelyn was educated on the importance of frequent visits to treat obesity as outlined per CMS and USPSTF guidelines and agreed to schedule her next follow up appointment today.   Attestation Statements:   Reviewed by clinician on day of visit: allergies, medications, problem list, medical history, surgical history, family history, social history, and previous  encounter notes.  I, Insurance claims handler, CMA, am acting as Energy manager for [employer], DO.  I have reviewed the above documentation for accuracy and completeness, and I agree with the above. Carlye Grippe, D.O.  The 21st Century Cures Act was signed into law in 2016 which includes the topic of electronic health records.  This provides immediate access to information in MyChart.  This includes consultation notes, operative notes, office notes, lab results and pathology reports.  If you have any questions about what you read please let us know at your next visit so we can discuss your concerns and take corrective action if need be.  We are right here with you.

## 2021-11-06 ENCOUNTER — Ambulatory Visit (HOSPITAL_COMMUNITY): Payer: Medicare Other

## 2021-11-09 ENCOUNTER — Telehealth (INDEPENDENT_AMBULATORY_CARE_PROVIDER_SITE_OTHER): Payer: Medicare Other | Admitting: Psychology

## 2021-11-09 DIAGNOSIS — F5089 Other specified eating disorder: Secondary | ICD-10-CM

## 2021-11-13 ENCOUNTER — Telehealth: Payer: Self-pay | Admitting: Nurse Practitioner

## 2021-11-13 NOTE — Telephone Encounter (Signed)
Spoke with patient and she states her left leg is painful to the touch but is not blistered. Pt states she was prescribed a medication in the past but is unsure of what the medication was, states it did help with the pain, she thinks the medication was a cream. Her appointment is not until 12/07/21 and patient only wants to see [facility]-NP.  ? ?OV on 02/2021 Bactroban ointment was prescribed, didn't see any other medication pertaining to leg pain. Please advise ?

## 2021-11-13 NOTE — Telephone Encounter (Signed)
Pt called and said she is having a pain in her left leg that is an ongoing issue, I scheduled her out for next available appt. She said Baldo Ash has prescribed her something for it before. She is requesting call back from nurse today at [phone]. Please advise ?

## 2021-11-16 ENCOUNTER — Other Ambulatory Visit: Payer: Self-pay | Admitting: Nurse Practitioner

## 2021-11-16 ENCOUNTER — Other Ambulatory Visit (INDEPENDENT_AMBULATORY_CARE_PROVIDER_SITE_OTHER): Payer: Self-pay | Admitting: Nurse Practitioner

## 2021-11-16 ENCOUNTER — Encounter (INDEPENDENT_AMBULATORY_CARE_PROVIDER_SITE_OTHER): Payer: Self-pay | Admitting: Nurse Practitioner

## 2021-11-16 ENCOUNTER — Other Ambulatory Visit: Payer: Self-pay

## 2021-11-16 ENCOUNTER — Ambulatory Visit (INDEPENDENT_AMBULATORY_CARE_PROVIDER_SITE_OTHER): Payer: Medicare Other | Admitting: Nurse Practitioner

## 2021-11-16 VITALS — BP 109/65 | HR 92 | Temp 98.6°F | Ht 64.0 in | Wt >= 6400 oz

## 2021-11-16 DIAGNOSIS — E785 Hyperlipidemia, unspecified: Secondary | ICD-10-CM | POA: Diagnosis not present

## 2021-11-16 DIAGNOSIS — E1169 Type 2 diabetes mellitus with other specified complication: Secondary | ICD-10-CM

## 2021-11-16 DIAGNOSIS — E669 Obesity, unspecified: Secondary | ICD-10-CM | POA: Diagnosis not present

## 2021-11-16 DIAGNOSIS — Z6841 Body Mass Index (BMI) 40.0 and over, adult: Secondary | ICD-10-CM

## 2021-11-16 DIAGNOSIS — I110 Hypertensive heart disease with heart failure: Secondary | ICD-10-CM

## 2021-11-16 DIAGNOSIS — F3342 Major depressive disorder, recurrent, in full remission: Secondary | ICD-10-CM

## 2021-11-16 DIAGNOSIS — Z7985 Long-term (current) use of injectable non-insulin antidiabetic drugs: Secondary | ICD-10-CM | POA: Diagnosis not present

## 2021-11-16 MED ORDER — TIRZEPATIDE 2.5 MG/0.5ML ~~LOC~~ SOAJ: 2.5000 mg | SUBCUTANEOUS | 0 refills | Status: DC

## 2021-11-16 NOTE — Progress Notes (Unsigned)
?  Office: [phone]  /  Fax: [phone] ? ? ? ?Date: 11/30/2021   ?Appointment Start Time: *** ?Duration: *** minutes ?Provider: Glennie Isle, Psy.D. ?Type of Session: Individual Therapy  ?Location of Patient: {gbptloc:23249} (private location) ?Location of Provider: Provider's Home (private office) ?Type of Contact: Telepsychological Visit via MyChart Video Visit ? ?Session Content:This provider called [name] [name] at 8:35am as she did not present for today's appointment. *** As such, today's appointment was initiated *** minutes late. [name] [name] is a 41 y.o. female presenting for a follow-up appointment to address the previously established treatment goal of increasing coping skills.Today's appointment was a telepsychological visit due to COVID-19. [name] [name] provided verbal consent for today's telepsychological appointment and she is aware she is responsible for securing confidentiality on her end of the session. Prior to proceeding with today's appointment, [name] [name]'s physical location at the time of this appointment was obtained as well a phone number she could be reached at in the event of technical difficulties. [name] [name] and this provider participated in today's telepsychological service.  ? ?This provider conducted a brief check-in. *** [name] [name] was receptive to today's appointment as evidenced by openness to sharing, responsiveness to feedback, and {gbreceptiveness:23401}. ? ?Mental Status Examination:  ?Appearance: {Appearance:22431} ?Behavior: {Behavior:22445} ?Mood: {gbmood:21757} ?Affect: {Affect:22436} ?Speech: {Speech:22432} ?Eye Contact: {Eye Contact:22433} ?Psychomotor Activity: {Motor Activity:22434} ?Gait: {gbgait:23404} ?Thought Process: {thought process:22448}  ?Thought Content/Perception: {disturbances:22451} ?Orientation: {Orientation:22437} ?Memory/Concentration: {gbcognition:22449} ?Insight: {Insight:22446} ?Judgment: {Insight:22446} ? ?Interventions:  ?{Interventions for Progress  Notes:23405} ? ?DSM-5 Diagnosis(es): F50.89 Other Specified Feeding or Eating Disorder, Emotional Eating Behaviors ? ?Treatment Goal & Progress: During the initial appointment with this provider, the following treatment goal was established: increase coping skills. [name] has demonstrated progress in her goal as evidenced by {gbtxprogress:22839}. [name] also {gbtxprogress2:22951}. ? ?Plan: The next appointment will be scheduled in {gbweeks:21758}, which will be via MyChart Video Visit. The next session will focus on {Plan for Next Appointment:23400}. ? ?

## 2021-11-16 NOTE — Progress Notes (Signed)
? ? ? ?Chief Complaint:  ? ?OBESITY ?[name] [name] is here to discuss her progress with her obesity treatment plan along with follow-up of her obesity related diagnoses. [name] [name] is on the Category 2 Plan and the Category 3 Plan and states she is following her eating plan approximately 90% of the time. [name] [name] states she is walking for 10-20 minutes 1 times per week. ? ?Today's visit was #: 3 ?Starting weight: 432 lbs ?Starting date: 10/13/2021 ?Today's weight: 417 lbs ?Today's date: 11/16/2021 ?Total lbs lost to date: 15 lbs ?Total lbs lost since last in-office visit: 0 ? ?Interim History: [name] [name] is frustrated due to wight gain. She is following Category 3. She snacks on veggie straws and Yasso bars. She is drinking water daily. She denies sodas. She is using her food scale to measure exact amount of protein. She reports cravings. ? ?Subjective:  ? ?1. Type 2 diabetes mellitus with other specified complication, without long-term current use of insulin ([name] [name]) ?[name] [name]'s last A1C was 6.1 on 10/13/2021. She has taken Ozempic in the past. She denied side effect while taking Ozempic. She reports/struggling with cravings.  She is snacking on veggies straws and yasso bars. She is concerned about worsening A1C.  ? ?2. Hyperlipidemia associated with type 2 diabetes mellitus ([place]) ?[name] [name] is taking Lipitor 20 mg. She denies side effects. Her last LDL looked better. It decreased from 156 to 108. ? ?3. Hypertensive heart disease with congestive heart failure, unspecified heart failure type ([name] [name]) ?[name] [name]'s blood pressure better today. She is taking Norvasc 5 mg, Coreg 25 mg BID, Isordil 10m TID and apresoline 100 mg. She denies chest pains, palpatations. She is on continuous 2 liters of oxgen via nasal cannula. She is not checking her blood pressures at home.  ? ?Assessment/Plan:  ? ?1. Type 2 diabetes mellitus with other specified complication, without long-term current use of insulin ([name] [name] ?[name] [name] agrees to start Mounjaro 2.5 mg  for 1 month with no refills. We discussed side effects. She denies a history of thyroid cancer, pancreatitis, MEN 2. Good blood sugar control is important to decrease the likelihood of diabetic complications such as nephropathy, neuropathy, limb loss, blindness, coronary artery disease, and death. Intensive lifestyle modification including diet, exercise and weight loss are the first line of treatment for diabetes.  ? ?- tirzepatide (MOUNJARO) 2.5 MG/0.5ML Pen; Inject 2.5 mg into the skin once a week.  Dispense: 2 mL; Refill: 0 ? ?2. Hyperlipidemia associated with type 2 diabetes mellitus ([name] [name] ?Cardiovascular risk and specific lipid/LDL goals reviewed.  [name] [name] will continue to follow up with cardiology. She will continue medications as directed. We discussed several lifestyle modifications today and [name] [name] will continue to work on diet, exercise and weight loss efforts. Orders and follow up as documented in patient record.  ? ?Counseling ?Intensive lifestyle modifications are the first line treatment for this issue. ?Dietary changes: Increase soluble fiber. Decrease simple carbohydrates. ?Exercise changes: Moderate to vigorous-intensity aerobic activity 150 minutes per week if tolerated. ?Lipid-lowering medications: see documented in medical record. ? ?3. Hypertensive heart disease with congestive heart failure, unspecified heart failure type ([name] [name] ?[name] [name] will continue to follow up with cardiology. She will continue medications as directed. She was encouraged to monitor blood pressure at home. She is working on healthy weight loss and exercise to improve blood pressure control. We will watch for signs of hypotension as she continues her lifestyle modifications. ? ?4. Obesity with current BMI of 71.7 ?[name] [name] currently in the action stage of change. As such, her  goal is to continue with weight loss efforts. She has agreed to the Category 3 Plan.  ? ?We discussed importance of eating all calories and meeting  protein goals with starting Mounjaro. If she finds she is not able to eat all her calories or meet her protein goals will consider stopping Mounjaro.   ? ?Exercise goals:  As is.  ? ?Behavioral modification strategies: increasing lean protein intake, increasing water intake, no skipping meals, and meal planning and cooking strategies. ? ?[name] [name] has agreed to follow-up with our clinic in 2 weeks. She was informed of the importance of frequent follow-up visits to maximize her success with intensive lifestyle modifications for her multiple health conditions.  ? ?Objective:  ? ?Blood pressure 109/65, pulse 92, temperature 98.6 ?F (37 ?C), height _0  (1.626 m), weight (!) 417 lb (189.1 kg), SpO2 92 %, unknown if currently breastfeeding. ?Body mass index is 71.58 kg/m?. ? ?General: Cooperative, alert, well developed, in no acute distress. ?HEENT: Conjunctivae and lids unremarkable. ?Cardiovascular: Regular rhythm.  ?Lungs: Normal work of breathing. ?Neurologic: No focal deficits.  ? ?Lab Results  ?Component Value Date  ? CREATININE 0.88 10/13/2021  ? BUN 12 10/13/2021  ? NA 140 10/13/2021  ? K 4.1 10/13/2021  ? CL 95 (L) 10/13/2021  ? CO2 31 (H) 10/13/2021  ? ?Lab Results  ?Component Value Date  ? ALT 19 10/13/2021  ? AST 20 10/13/2021  ? ALKPHOS 118 10/13/2021  ? BILITOT 0.4 10/13/2021  ? ?Lab Results  ?Component Value Date  ? HGBA1C 6.1 (H) 10/13/2021  ? HGBA1C 5.9 11/26/2020  ? HGBA1C 5.5 06/04/2020  ? HGBA1C 6.2 11/20/2019  ? HGBA1C 6.2 09/15/2016  ? ?Lab Results  ?Component Value Date  ? INSULIN 23.6 10/13/2021  ? ?Lab Results  ?Component Value Date  ? TSH 1.680 10/13/2021  ? ?Lab Results  ?Component Value Date  ? CHOL 184 10/13/2021  ? HDL 59 10/13/2021  ? LDLCALC 108 (H) 10/13/2021  ? LDLDIRECT 99.0 09/15/2016  ? TRIG 96 10/13/2021  ? CHOLHDL 4 11/26/2020  ? ?No results found for: VD25OH ?Lab Results  ?Component Value Date  ? WBC 6.6 10/13/2021  ? HGB 11.5 10/13/2021  ? HCT 35.2 10/13/2021  ? MCV 89 10/13/2021   ? PLT 260 10/13/2021  ? ?Lab Results  ?Component Value Date  ? IRON 54 09/19/2016  ? TIBC 227 (L) 09/19/2016  ? ? ?Obesity Behavioral Intervention:  ? ?Approximately 15 minutes were spent on the discussion below. ? ?ASK: ?We discussed the diagnosis of obesity with Amy today and Junior agreed to give [place] permission to discuss obesity behavioral modification therapy today. ? ?ASSESS: ?Karyl has the diagnosis of obesity and her BMI today is 71.7. Rennee is in the action stage of change.  ? ?ADVISE: ?Letetia was educated on the multiple health risks of obesity as well as the benefit of weight loss to improve her health. She was advised of the need for long term treatment and the importance of lifestyle modifications to improve her current health and to decrease her risk of future health problems. ? ?AGREE: ?Multiple dietary modification options and treatment options were discussed and Leonila agreed to follow the recommendations documented in the above note. ? ?ARRANGE: ?Feliciana was educated on the importance of frequent visits to treat obesity as outlined per CMS and USPSTF guidelines and agreed to schedule her next follow up appointment today. ? ?Attestation Statements:  ? ?Reviewed by clinician on day of visit: allergies, medications, problem list, medical  history, surgical history, family history, social history, and previous encounter notes. ? ?I, Lizbeth Bark, RMA, am acting as Location manager for Everardo Pacific, FNP.  ? ?I have reviewed the above documentation for accuracy and completeness, and I agree with the above. Everardo Pacific, FNP  ?

## 2021-11-16 NOTE — Telephone Encounter (Signed)
Pt called back requesting medication for leg pain.  ?

## 2021-11-17 NOTE — Telephone Encounter (Signed)
Chart supports Rx ?Last seen 11/26/2020 ?Next OV 12/07/2021 ?

## 2021-11-17 NOTE — Telephone Encounter (Signed)
Appointment sheduled ?

## 2021-11-18 ENCOUNTER — Encounter: Payer: Self-pay | Admitting: Nurse Practitioner

## 2021-11-18 ENCOUNTER — Ambulatory Visit (INDEPENDENT_AMBULATORY_CARE_PROVIDER_SITE_OTHER): Payer: Medicare Other | Admitting: Nurse Practitioner

## 2021-11-18 VITALS — BP 138/88 | HR 89 | Temp 97.4°F | Ht 64.0 in | Wt >= 6400 oz

## 2021-11-18 DIAGNOSIS — M7989 Other specified soft tissue disorders: Secondary | ICD-10-CM

## 2021-11-18 DIAGNOSIS — L03116 Cellulitis of left lower limb: Secondary | ICD-10-CM | POA: Diagnosis not present

## 2021-11-18 MED ORDER — SULFAMETHOXAZOLE-TRIMETHOPRIM 800-160 MG PO TABS: 1.0000 | ORAL_TABLET | Freq: Two times a day (BID) | ORAL | 0 refills | Status: DC

## 2021-11-18 NOTE — Progress Notes (Signed)
? ?Acute Office Visit ? ?Subjective:  ? ? Patient ID: [name] [name], female    DOB: May 02, 1981, 41 y.o.   MRN: [id number] ? ?Chief Complaint  ?Patient presents with  ? Acute Visit  ?  Pt c/o left leg pain x 1 week and she noticed today there is some blisters on the back of the leg.   ? ? ?HPI ?Patient is in today for left leg pain for about a week. Today she noticed some blisters on the back of her leg. She endorses redness and swelling. She describes the pain as tender to touch and irritating. She took tylenol which helps some. She denies fevers. Her right leg is not swollen or having any symptoms. She denies recent travel, prolonged periods of sitting, shortness of breath, chest pain, and fevers.  ? ?Past Medical History:  ?Diagnosis Date  ? Acute on chronic respiratory failure with hypoxia ([place]) 02/18/2020  ? Chronic diastolic heart failure ([place])   ? Echo 10/12: EF 50-55 // b. Echo 10/17: EF 50-55, mild BAE, PASP 42 // Echo 10/18: Mild concentric LVH, EF 55-60, normal wall motion, mild MAC, severe LAE, mild TR, PASP 15, trivial pericardial effusion  ? CKD (chronic kidney disease) stage 4, GFR 15-29 ml/min (HCC) 10/25/2016  ? admitted in 2/18 with SCr of 10 in the setting of dehydration from illness, etc >> improved to 5 at DC  ? CKD (chronic kidney disease) stage 4, GFR 15-29 ml/min (HCC) 10/25/2016  ? DM2 (diabetes mellitus, type 2) ([place])   ? A1c 7.2 in 05/2016  ? Gestational diabetes mellitus in pregnancy 05/24/2013  ? HCAP (healthcare-associated pneumonia)   ? HLD (hyperlipidemia)   ? HTN (hypertension)   ? HTN in pregnancy, chronic 05/24/2013  ? Hypertensive heart disease with CHF (congestive heart failure) ([place]) 05/25/2016  ? Lower extremity edema   ? Morbid obesity ([place])   ? OSA (obstructive sleep apnea) 07/09/2011  ? s/p Trach 2/2 prolonged respiratory failure during admx for CHF in 05/2016  ? Prediabetes   ? Pulmonary hypertension ([place])   ? secondary from CHF/OSA  ? Sinus tachycardia   ? SOB (shortness  of breath)   ? ? ?Past Surgical History:  ?Procedure Laterality Date  ? TRACHEOSTOMY TUBE PLACEMENT N/A 06/11/2016  ? Procedure: TRACHEOSTOMY;  Surgeon: Melida Quitter, MD;  Location: [facility];  Service: ENT;  Laterality: N/A;  ? ? ?Family History  ?Problem Relation Age of Onset  ? Hypertension Mother   ? Hyperlipidemia Mother   ? Diabetes Mother   ? Obesity Mother   ? Diabetes Other   ? Birth defects Paternal Grandfather   ?     unknown type  ? ? ?Social History  ? ?Socioeconomic History  ? Marital status: Single  ?  Spouse name: Not on file  ? Number of children: Not on file  ? Years of education: Not on file  ? Highest education level: Not on file  ?Occupational History  ? Occupation: disability   ? Occupation: Stay at home Mom  ?Tobacco Use  ? Smoking status: Never  ? Smokeless tobacco: Never  ?Vaping Use  ? Vaping Use: Never used  ?Substance and Sexual Activity  ? Alcohol use: No  ? Drug use: No  ? Sexual activity: Not Currently  ?  Birth control/protection: I.U.D.  ?Other Topics Concern  ? Not on file  ?Social History Narrative  ? LIVES ALONE  ? 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 DENIES ANY TOBACCO OR  ETOH USE  ?   ?   ? ?Social Determinants of Health  ? ?Financial Resource Strain: Not on file  ?Food Insecurity: Not on file  ?Transportation Needs: Not on file  ?Physical Activity: Not on file  ?Stress: Not on file  ?Social Connections: Not on file  ?Intimate Partner Violence: Not on file  ? ? ?Outpatient Medications Prior to Visit  ?Medication Sig Dispense Refill  ? amLODipine (NORVASC) 5 MG tablet TAKE 1 TABLET (5 MG TOTAL) BY MOUTH DAILY. 90 tablet 1  ? atorvastatin (LIPITOR) 20 MG tablet Take 1 tablet (20 mg total) by mouth daily. 90 tablet 3  ? carvedilol (COREG) 25 MG tablet TAKE 1 TABLET BY MOUTH TWICE A DAY 180 tablet 2  ? citalopram (CELEXA) 10 MG tablet TAKE 1 TABLET BY MOUTH EVERY DAY 90 tablet 3  ? furosemide (LASIX) 40 MG tablet TAKE 1 TABLET BY MOUTH EVERY DAY 90 tablet 2  ? hydrALAZINE  (APRESOLINE) 100 MG tablet TAKE 1 TABLET BY MOUTH 3 TIMES DAILY. 270 tablet 2  ? isosorbide dinitrate (ISORDIL) 30 MG tablet TAKE 1 TABLET BY MOUTH 3 TIMES DAILY. 270 tablet 2  ? tirzepatide (MOUNJARO) 2.5 MG/0.5ML Pen Inject 2.5 mg into the skin once a week. 2 mL 0  ? ?No facility-administered medications prior to visit.  ? ? ?Allergies  ?Allergen Reactions  ? No Known Allergies   ? ? ?Review of Systems ?See pertinent positives and negatives per HPI. ? ?   ?Objective:  ?  ?Physical Exam ?Vitals and nursing note reviewed.  ?Constitutional:   ?   General: She is not in acute distress. ?   Appearance: Normal appearance.  ?HENT:  ?   Head: Normocephalic.  ?Eyes:  ?   Conjunctiva/sclera: Conjunctivae normal.  ?Cardiovascular:  ?   Rate and Rhythm: Normal rate and regular rhythm.  ?   Pulses: Normal pulses.  ?   Heart sounds: Normal heart sounds.  ?Pulmonary:  ?   Effort: Pulmonary effort is normal.  ?   Breath sounds: Normal breath sounds.  ?Musculoskeletal:  ?   Cervical back: Normal range of motion.  ?   Left lower leg: Edema present.  ?Skin: ?   General: Skin is warm.  ?   Findings: Erythema (and blisters to posterior left lower leg) present.  ?Neurological:  ?   General: No focal deficit present.  ?   Mental Status: She is alert and oriented to person, place, and time.  ?Psychiatric:     ?   Mood and Affect: Mood normal.     ?   Behavior: Behavior normal.     ?   Thought Content: Thought content normal.     ?   Judgment: Judgment normal.  ? ? ?BP 138/88 (BP Location: Left Arm, Patient Position: Sitting, Cuff Size: Large)   Pulse 89   Temp (!) 97.4 ?F (36.3 ?C) (Temporal)   Ht _0  (1.626 m)   Wt (!) 418 lb 12.8 oz (190 kg)   SpO2 94% Comment: with O2 tank on 2 L  BMI 71.89 kg/m?  ?Wt Readings from Last 3 Encounters:  ?11/18/21 (!) 418 lb 12.8 oz (190 kg)  ?11/16/21 (!) 417 lb (189.1 kg)  ?10/29/21 (!) 411 lb (186.4 kg)  ? ? ?Health Maintenance Due  ?Topic Date Due  ? TETANUS/TDAP  Never done  ? PAP  SMEAR-Modifier  Never done  ? OPHTHALMOLOGY EXAM  10/28/2017  ? COVID-19 Vaccine (3 - Booster for [employer]  series) 02/13/2020  ? FOOT EXAM  11/19/2020  ? INFLUENZA VACCINE  03/23/2021  ? ? ?There are no preventive care reminders to display for this patient. ? ? ?Lab Results  ?Component Value Date  ? TSH 1.680 10/13/2021  ? ?Lab Results  ?Component Value Date  ? WBC 6.6 10/13/2021  ? HGB 11.5 10/13/2021  ? HCT 35.2 10/13/2021  ? MCV 89 10/13/2021  ? PLT 260 10/13/2021  ? ?Lab Results  ?Component Value Date  ? NA 140 10/13/2021  ? K 4.1 10/13/2021  ? CO2 31 (H) 10/13/2021  ? GLUCOSE 135 (H) 10/13/2021  ? BUN 12 10/13/2021  ? CREATININE 0.88 10/13/2021  ? BILITOT 0.4 10/13/2021  ? ALKPHOS 118 10/13/2021  ? AST 20 10/13/2021  ? ALT 19 10/13/2021  ? PROT 7.9 10/13/2021  ? ALBUMIN 4.2 10/13/2021  ? CALCIUM 9.3 10/13/2021  ? ANIONGAP 10 02/27/2020  ? EGFR 84 10/13/2021  ? GFR 85.14 11/26/2020  ? ?Lab Results  ?Component Value Date  ? CHOL 184 10/13/2021  ? ?Lab Results  ?Component Value Date  ? HDL 59 10/13/2021  ? ?Lab Results  ?Component Value Date  ? LDLCALC 108 (H) 10/13/2021  ? ?Lab Results  ?Component Value Date  ? TRIG 96 10/13/2021  ? ?Lab Results  ?Component Value Date  ? CHOLHDL 4 11/26/2020  ? ?Lab Results  ?Component Value Date  ? HGBA1C 6.1 (H) 10/13/2021  ? ? ?   ?Assessment & Plan:  ? ?Problem List Items Addressed This Visit   ? ?  ? Other  ? Cellulitis - Primary  ?  Left lower leg swelling with redness and fluid-filled blisters.  We will treat with Bactrim 1 tablet twice a day for 10 days for cellulitis.  We will also have her take an extra furosemide tablet at lunchtime for 3 days to help with the swelling.  Last CMP reviewed and kidney function and potassium was normal.  We are checking a stat ultrasound of her lower extremity to rule out DVT.  Encouraged her to elevate her legs while she is sitting.  Discussed if worsening pain, swelling, developing any fevers to go to the ER.  We will have her follow-up  in 2 days to ensure that the antibiotic has started to work and her symptoms are not worsening.  Low threshold for sending to ER. ?  ?  ? ?Other Visit Diagnoses   ? ? Left leg swelling      ? Treating for cellulitis as above.  We

## 2021-11-18 NOTE — Patient Instructions (Signed)
It was great to see you! ? ?Start bactrim 1 tablet twice a day (antibiotic) ? ?Increase your lasix (fluid pill) to twice a day for 3 days. ? ?We are going to order and set up an ultrasound of your leg.  ? ?Keep your legs elevated while sitting. If your symptoms gets any worse, you develop a fever go to the ER ? ?Let's follow-up in 2 days, sooner if you have concerns. ? ?If a referral was placed today, you will be contacted for an appointment. Please note that routine referrals can sometimes take up to 3-4 weeks to process. Please call our office if you haven't heard anything after this time frame. ? ?Take care, ? ?Vance Peper, NP ? ?

## 2021-11-18 NOTE — Assessment & Plan Note (Addendum)
Left lower leg swelling with redness and fluid-filled blisters.  We will treat with Bactrim 1 tablet twice a day for 10 days for cellulitis.  We will also have her take an extra furosemide tablet at lunchtime for 3 days to help with the swelling.  Last CMP reviewed and kidney function and potassium was normal.  We are checking a stat ultrasound of her lower extremity to rule out DVT.  Encouraged her to elevate her legs while she is sitting.  Discussed if worsening pain, swelling, developing any fevers to go to the ER.  We will have her follow-up in 2 days to ensure that the antibiotic has started to work and her symptoms are not worsening.  Low threshold for sending to ER. ?

## 2021-11-19 NOTE — Progress Notes (Signed)
? ?Established Patient Office Visit ? ?Subjective:  ?Patient ID: [name] [name], female    DOB: August 28, 1980  Age: 41 y.o. MRN: [id number] ? ?CC:  ?Chief Complaint  ?Patient presents with  ? Follow-up  ?  Follow up celitis no concerns  ? ? ?HPI ?[name] [name] presents for follow-up on her left lower leg cellulitis and swelling.  She has been taking an extra fluid pill for the past 2 days and has noticed an increase in urination.  She states that her leg is still hurting and has been taking Tylenol for this, however this is not helping with the pain.  She has an ultrasound scheduled for today to rule out DVT.  She has been afraid to look at her leg, so she does not know if it is improving or getting worse.  She denies fevers. ? ?Past Medical History:  ?Diagnosis Date  ? Acute on chronic respiratory failure with hypoxia ([place]) 02/18/2020  ? Chronic diastolic heart failure ([place])   ? Echo 10/12: EF 50-55 // b. Echo 10/17: EF 50-55, mild BAE, PASP 42 // Echo 10/18: Mild concentric LVH, EF 55-60, normal wall motion, mild MAC, severe LAE, mild TR, PASP 15, trivial pericardial effusion  ? CKD (chronic kidney disease) stage 4, GFR 15-29 ml/min (HCC) 10/25/2016  ? admitted in 2/18 with SCr of 10 in the setting of dehydration from illness, etc >> improved to 5 at DC  ? CKD (chronic kidney disease) stage 4, GFR 15-29 ml/min (HCC) 10/25/2016  ? DM2 (diabetes mellitus, type 2) ([place])   ? A1c 7.2 in 05/2016  ? Gestational diabetes mellitus in pregnancy 05/24/2013  ? HCAP (healthcare-associated pneumonia)   ? HLD (hyperlipidemia)   ? HTN (hypertension)   ? HTN in pregnancy, chronic 05/24/2013  ? Hypertensive heart disease with CHF (congestive heart failure) ([place]) 05/25/2016  ? Lower extremity edema   ? Morbid obesity ([place])   ? OSA (obstructive sleep apnea) 07/09/2011  ? s/p Trach 2/2 prolonged respiratory failure during admx for CHF in 05/2016  ? Prediabetes   ? Pulmonary hypertension ([place])   ? secondary from CHF/OSA  ? Sinus  tachycardia   ? SOB (shortness of breath)   ? ? ?Past Surgical History:  ?Procedure Laterality Date  ? TRACHEOSTOMY TUBE PLACEMENT N/A 06/11/2016  ? Procedure: TRACHEOSTOMY;  Surgeon: Melida Quitter, MD;  Location: [facility];  Service: ENT;  Laterality: N/A;  ? ? ?Family History  ?Problem Relation Age of Onset  ? Hypertension Mother   ? Hyperlipidemia Mother   ? Diabetes Mother   ? Obesity Mother   ? Diabetes Other   ? Birth defects Paternal Grandfather   ?     unknown type  ? ? ?Social History  ? ?Socioeconomic History  ? Marital status: Single  ?  Spouse name: Not on file  ? Number of children: Not on file  ? Years of education: Not on file  ? Highest education level: Not on file  ?Occupational History  ? Occupation: disability   ? Occupation: Stay at home Mom  ?Tobacco Use  ? Smoking status: Never  ? Smokeless tobacco: Never  ?Vaping Use  ? Vaping Use: Never used  ?Substance and Sexual Activity  ? Alcohol use: No  ? Drug use: No  ? Sexual activity: Not Currently  ?  Birth control/protection: I.U.D.  ?Other Topics Concern  ? Not on file  ?Social History Narrative  ? LIVES ALONE  ? 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  DENIES ANY TOBACCO OR ETOH USE  ?   ?   ? ?Social Determinants of Health  ? ?Financial Resource Strain: Not on file  ?Food Insecurity: Not on file  ?Transportation Needs: Not on file  ?Physical Activity: Not on file  ?Stress: Not on file  ?Social Connections: Not on file  ?Intimate Partner Violence: Not on file  ? ? ?Outpatient Medications Prior to Visit  ?Medication Sig Dispense Refill  ? amLODipine (NORVASC) 5 MG tablet TAKE 1 TABLET (5 MG TOTAL) BY MOUTH DAILY. 90 tablet 1  ? atorvastatin (LIPITOR) 20 MG tablet Take 1 tablet (20 mg total) by mouth daily. 90 tablet 3  ? carvedilol (COREG) 25 MG tablet TAKE 1 TABLET BY MOUTH TWICE A DAY 180 tablet 2  ? citalopram (CELEXA) 10 MG tablet TAKE 1 TABLET BY MOUTH EVERY DAY 90 tablet 3  ? furosemide (LASIX) 40 MG tablet TAKE 1 TABLET BY MOUTH EVERY DAY 90  tablet 2  ? hydrALAZINE (APRESOLINE) 100 MG tablet TAKE 1 TABLET BY MOUTH 3 TIMES DAILY. 270 tablet 2  ? isosorbide dinitrate (ISORDIL) 30 MG tablet TAKE 1 TABLET BY MOUTH 3 TIMES DAILY. 270 tablet 2  ? sulfamethoxazole-trimethoprim (BACTRIM DS) 800-160 MG tablet Take 1 tablet by mouth 2 (two) times daily. 20 tablet 0  ? tirzepatide (MOUNJARO) 2.5 MG/0.5ML Pen Inject 2.5 mg into the skin once a week. 2 mL 0  ? ?No facility-administered medications prior to visit.  ? ? ?Allergies  ?Allergen Reactions  ? No Known Allergies   ? ? ?ROS ?Review of Systems ?See pertinent positives and negatives per HPI. ?  ?Objective:  ?  ?Physical Exam ?Vitals and nursing note reviewed.  ?Constitutional:   ?   General: She is not in acute distress. ?   Appearance: Normal appearance.  ?HENT:  ?   Head: Normocephalic and atraumatic.  ?Eyes:  ?   Conjunctiva/sclera: Conjunctivae normal.  ?Cardiovascular:  ?   Rate and Rhythm: Normal rate.  ?Pulmonary:  ?   Effort: Pulmonary effort is normal.  ?Musculoskeletal:  ?   Cervical back: Normal range of motion.  ?   Left lower leg: Edema present.  ?Skin: ?   General: Skin is warm and dry.  ?   Findings: Erythema present.  ?   Comments: Left lower extremity swollen, with mild erythema, blisters to posterior lower leg, improved  ?Neurological:  ?   General: No focal deficit present.  ?   Mental Status: She is alert and oriented to person, place, and time.  ?Psychiatric:     ?   Mood and Affect: Mood normal.     ?   Behavior: Behavior normal.     ?   Thought Content: Thought content normal.     ?   Judgment: Judgment normal.  ? ? ?BP 120/60 (BP Location: Left Arm, Patient Position: Sitting, Cuff Size: Large)   Pulse 88   Temp (!) 97.1 ?F (36.2 ?C) (Temporal)   Ht _0  (1.626 m)   Wt (!) 417 lb 12.8 oz (189.5 kg)   SpO2 96%   BMI 71.72 kg/m?  ?Wt Readings from Last 3 Encounters:  ?11/20/21 (!) 417 lb 12.8 oz (189.5 kg)  ?11/18/21 (!) 418 lb 12.8 oz (190 kg)  ?11/16/21 (!) 417 lb (189.1 kg)   ? ? ? ?Health Maintenance Due  ?Topic Date Due  ? TETANUS/TDAP  Never done  ? PAP SMEAR-Modifier  Never done  ? OPHTHALMOLOGY EXAM  10/28/2017  ?  COVID-19 Vaccine (3 - Booster for [employer] series) 02/13/2020  ? FOOT EXAM  11/19/2020  ? INFLUENZA VACCINE  03/23/2021  ? ? ?There are no preventive care reminders to display for this patient. ? ?Lab Results  ?Component Value Date  ? TSH 1.680 10/13/2021  ? ?Lab Results  ?Component Value Date  ? WBC 6.6 10/13/2021  ? HGB 11.5 10/13/2021  ? HCT 35.2 10/13/2021  ? MCV 89 10/13/2021  ? PLT 260 10/13/2021  ? ?Lab Results  ?Component Value Date  ? NA 140 10/13/2021  ? K 4.1 10/13/2021  ? CO2 31 (H) 10/13/2021  ? GLUCOSE 135 (H) 10/13/2021  ? BUN 12 10/13/2021  ? CREATININE 0.88 10/13/2021  ? BILITOT 0.4 10/13/2021  ? ALKPHOS 118 10/13/2021  ? AST 20 10/13/2021  ? ALT 19 10/13/2021  ? PROT 7.9 10/13/2021  ? ALBUMIN 4.2 10/13/2021  ? CALCIUM 9.3 10/13/2021  ? ANIONGAP 10 02/27/2020  ? EGFR 84 10/13/2021  ? GFR 85.14 11/26/2020  ? ?Lab Results  ?Component Value Date  ? CHOL 184 10/13/2021  ? ?Lab Results  ?Component Value Date  ? HDL 59 10/13/2021  ? ?Lab Results  ?Component Value Date  ? LDLCALC 108 (H) 10/13/2021  ? ?Lab Results  ?Component Value Date  ? TRIG 96 10/13/2021  ? ?Lab Results  ?Component Value Date  ? CHOLHDL 4 11/26/2020  ? ?Lab Results  ?Component Value Date  ? HGBA1C 6.1 (H) 10/13/2021  ? ? ?  ?Assessment & Plan:  ? ?Problem List Items Addressed This Visit   ? ?  ? Other  ? Cellulitis - Primary  ?  Cellulitis and swelling to left lower extremity has improved since 2 days ago.  Recommend that she take 1 extra dose of Lasix today, and then she can go back to her normal dose starting tomorrow.  Continue antibiotic twice a day until complete.  With ongoing pain, not relieved by Tylenol we will send in tramadol 50 mg twice daily as needed pain.  PDMP reviewed.  Discussed that this can make her sleepy, do not drive while taking.  Leg wrapped today with compression wrap  and nonadherent pad to help with swelling.  Encouraged her to wash her legs with soap and water and make sure to pat dry.  I recommend that she get the ultrasound as discussed at her last visit.  Continue elevating h

## 2021-11-20 ENCOUNTER — Ambulatory Visit (HOSPITAL_COMMUNITY): Payer: Medicare Other

## 2021-11-20 ENCOUNTER — Encounter: Payer: Self-pay | Admitting: Nurse Practitioner

## 2021-11-20 ENCOUNTER — Ambulatory Visit (INDEPENDENT_AMBULATORY_CARE_PROVIDER_SITE_OTHER): Payer: Medicare Other | Admitting: Nurse Practitioner

## 2021-11-20 VITALS — BP 120/60 | HR 88 | Temp 97.1°F | Ht 64.0 in | Wt >= 6400 oz

## 2021-11-20 DIAGNOSIS — L03116 Cellulitis of left lower limb: Secondary | ICD-10-CM

## 2021-11-20 MED ORDER — TRAMADOL HCL 50 MG PO TABS: 50.0000 mg | ORAL_TABLET | Freq: Two times a day (BID) | ORAL | 0 refills | Status: AC | PRN

## 2021-11-20 NOTE — Assessment & Plan Note (Addendum)
Cellulitis and swelling to left lower extremity has improved since 2 days ago.  Recommend that she take 1 extra dose of Lasix today, and then she can go back to her normal dose starting tomorrow.  Continue antibiotic twice a day until complete.  With ongoing pain, not relieved by Tylenol we will send in tramadol 50 mg twice daily as needed pain.  PDMP reviewed.  Discussed that this can make her sleepy, do not drive while taking.  Leg wrapped today with compression wrap and nonadherent pad to help with swelling.  Encouraged her to wash her legs with soap and water and make sure to pat dry.  I recommend that she get the ultrasound as discussed at her last visit.  Continue elevating her leg when sitting.  Follow-up with symptoms do not improve or worsen, keep neck scheduled appointment with PCP in 2 weeks. ?

## 2021-11-20 NOTE — Patient Instructions (Signed)
It was great to see you! ? ?Take one more extra fluid pill today. Keep your leg wrapped until Monday, you may need to cut it off or you can soak it in water if it sticks. Make sure you get the ultrasound done. Keep taking your antibiotic until it's finished. You can take tramadol every 12 hours as needed for pain. It may make you sleepy.  ? ?Keep your next appointment with [facility] NP in April, sooner with concerns.  ? ?Take care, ? ?Vance Peper, NP ? ?

## 2021-11-30 ENCOUNTER — Ambulatory Visit (INDEPENDENT_AMBULATORY_CARE_PROVIDER_SITE_OTHER): Payer: Medicare Other | Admitting: Family Medicine

## 2021-11-30 ENCOUNTER — Telehealth (INDEPENDENT_AMBULATORY_CARE_PROVIDER_SITE_OTHER): Payer: Self-pay | Admitting: Psychology

## 2021-11-30 ENCOUNTER — Telehealth (INDEPENDENT_AMBULATORY_CARE_PROVIDER_SITE_OTHER): Payer: Medicare Other | Admitting: Psychology

## 2021-11-30 ENCOUNTER — Encounter (INDEPENDENT_AMBULATORY_CARE_PROVIDER_SITE_OTHER): Payer: Self-pay | Admitting: Family Medicine

## 2021-11-30 ENCOUNTER — Telehealth (INDEPENDENT_AMBULATORY_CARE_PROVIDER_SITE_OTHER): Payer: Self-pay

## 2021-11-30 VITALS — BP 104/61 | HR 84 | Temp 98.0°F | Ht 64.0 in | Wt >= 6400 oz

## 2021-11-30 DIAGNOSIS — E119 Type 2 diabetes mellitus without complications: Secondary | ICD-10-CM | POA: Diagnosis not present

## 2021-11-30 DIAGNOSIS — E669 Obesity, unspecified: Secondary | ICD-10-CM

## 2021-11-30 DIAGNOSIS — Z6841 Body Mass Index (BMI) 40.0 and over, adult: Secondary | ICD-10-CM

## 2021-11-30 DIAGNOSIS — E1169 Type 2 diabetes mellitus with other specified complication: Secondary | ICD-10-CM

## 2021-11-30 NOTE — Progress Notes (Unsigned)
?  Office: [phone]  /  Fax: [phone] ? ? ? ?Date: 12/01/2021   ?Appointment Start Time: *** ?Duration: *** minutes ?Provider: Glennie Isle, Psy.D. ?Type of Session: Individual Therapy  ?Location of Patient: {gbptloc:23249} (private location) ?Location of Provider: Provider's Home (private office) ?Type of Contact: Telepsychological Visit via MyChart Video Visit ? ?Session Content:This provider called [name] at 8:32am as she did not present for today's appointment. A HIPAA compliant voicemail was left requesting a call back.  As such, today's appointment was initiated *** minutes late. [name] [name] is a 41 y.o. female presenting for a follow-up appointment to address the previously established treatment goal of increasing coping skills.Today's appointment was a telepsychological visit due to COVID-19. [name] provided verbal consent for today's telepsychological appointment and she is aware she is responsible for securing confidentiality on her end of the session. Prior to proceeding with today's appointment, [name]'s physical location at the time of this appointment was obtained as well a phone number she could be reached at in the event of technical difficulties. [name] and this provider participated in today's telepsychological service.  ? ?This provider conducted a brief check-in. *** [name] was receptive to today's appointment as evidenced by openness to sharing, responsiveness to feedback, and {gbreceptiveness:23401}. ? ?Mental Status Examination:  ?Appearance: {Appearance:22431} ?Behavior: {Behavior:22445} ?Mood: {gbmood:21757} ?Affect: {Affect:22436} ?Speech: {Speech:22432} ?Eye Contact: {Eye Contact:22433} ?Psychomotor Activity: {Motor Activity:22434} ?Gait: {gbgait:23404} ?Thought Process: {thought process:22448}  ?Thought Content/Perception: {disturbances:22451} ?Orientation: {Orientation:22437} ?Memory/Concentration: {gbcognition:22449} ?Insight: {Insight:22446} ?Judgment:  {Insight:22446} ? ?Interventions:  ?{Interventions for Progress Notes:23405} ? ?DSM-5 Diagnosis(es): F50.89 Other Specified Feeding or Eating Disorder, Emotional Eating Behaviors ? ?Treatment Goal & Progress: During the initial appointment with this provider, the following treatment goal was established: increase coping skills. [name] has demonstrated progress in her goal as evidenced by {gbtxprogress:22839}. [name] also {gbtxprogress2:22951}. ? ?Plan: The next appointment will be scheduled in {gbweeks:21758}, which will be via MyChart Video Visit. The next session will focus on {Plan for Next Appointment:23400}. ? ?

## 2021-11-30 NOTE — Telephone Encounter (Signed)
?  Office: [phone]  /  Fax: [phone] ? ?Date of Call: November 30, 2021  ?Time of Call: 8:35am ?Duration of Call: ~1.5 minute(s) ?Provider: Glennie Isle, PsyD ? ?CONTENT: This provider called [name] [name] to check-in as she did not present for today's MyChart Video Visit appointment at 8:30am. [name] [name] explained she has an in-person appointment in [place] this morning and requested to reschedule. She acknowledged understanding the one time no show fee waiver will be applied to today's appointment. No evidence or endorsement of any safety concerns. All questions/concerns addressed.  ? ?PLAN: [name] [name] is scheduled for an appointment on December 01, 2021 at 8:30am via [place] Visit. ? ? ?

## 2021-11-30 NOTE — Telephone Encounter (Signed)
Prior authorization started for [facility]. Will notify patient and provider once response is received.  ?

## 2021-12-01 ENCOUNTER — Telehealth (INDEPENDENT_AMBULATORY_CARE_PROVIDER_SITE_OTHER): Payer: Self-pay | Admitting: Psychology

## 2021-12-01 ENCOUNTER — Encounter (INDEPENDENT_AMBULATORY_CARE_PROVIDER_SITE_OTHER): Payer: Self-pay

## 2021-12-01 ENCOUNTER — Telehealth (INDEPENDENT_AMBULATORY_CARE_PROVIDER_SITE_OTHER): Payer: Medicare Other | Admitting: Psychology

## 2021-12-01 ENCOUNTER — Telehealth (INDEPENDENT_AMBULATORY_CARE_PROVIDER_SITE_OTHER): Payer: Self-pay | Admitting: Nurse Practitioner

## 2021-12-01 ENCOUNTER — Other Ambulatory Visit (INDEPENDENT_AMBULATORY_CARE_PROVIDER_SITE_OTHER): Payer: Self-pay | Admitting: Nurse Practitioner

## 2021-12-01 DIAGNOSIS — E1169 Type 2 diabetes mellitus with other specified complication: Secondary | ICD-10-CM

## 2021-12-01 NOTE — Telephone Encounter (Signed)
?  Office: [phone]  /  Fax: [phone] ? ?Date of Call: December 01, 2021  ?Time of Call: 8:32am ?Provider: Glennie Isle, PsyD ? ?CONTENT: This provider called [name] [name] to check-in as she did not present for today's MyChart Video Visit appointment at 8:30am. A HIPAA compliant voicemail was left requesting a call back. Of note, this provider stayed on the MyChart Video Visit appointment for 5 minutes prior to signing off per the clinic's grace period policy.   ? ?PLAN: This provider will wait for [name] [name] to call back. No further follow-up planned by this provider.  ? ?

## 2021-12-01 NOTE — Telephone Encounter (Signed)
?  Office: [phone]  /  Fax: [phone] ? ?Date of Call: December 01, 2021  ?Time of Call: 8:54am ?Duration of Call: 3 minute(s) ?Provider: Glennie Isle, PsyD ? ?CONTENT: This provider called [place] as she presented for today's MyChart Video Visit 19 minutes late. She explained she "overslept." This provider explained today's appointment would have to be rescheduled. Heidie acknowledged understanding and apologized. A brief check-in was completed. She indicated "Everything is going good," adding she lost an additional 16 pounds. Based on progress to date, she was receptive to scheduling an appointment in three weeks. No evidence or endorsement of any safety concerns. All questions/concerns addressed.  ? ?PLAN:  Milliani is scheduled for an appointment on Dec 21, 2021 at 8:30am via [place] [facility]. ? ? ?

## 2021-12-01 NOTE — Progress Notes (Signed)
? ? ? ?Chief Complaint:  ? ?OBESITY ?Mally is here to discuss her progress with her obesity treatment plan along with follow-up of her obesity related diagnoses. Dellamae is on the Category 3 Plan and states she is following her eating plan approximately 80% of the time. Violette states she is doing some walking.  ? ?Today's visit was #: 4 ?Starting weight: 432 lbs ?Starting date: 10/13/2021 ?Today's weight: 401 lbs ?Today's date: 11/30/2021 ?Total lbs lost to date: 58 ?Total lbs lost since last in-office visit: 16 ? ?Interim History: Johnnay Ludmilla Mcgillis is here for a follow up office visit. We reviewed her meal plan and all questions were answered. Patient's food recall appears to be accurate and consistent with what is on plan when she is following it. When eating on plan, her hunger and cravings are well controlled. Kadian got back on the plan ate more protein and less simple carbohydrates. ? ?Subjective:  ? ?1. Type 2 diabetes mellitus with other specified complication, without long-term current use of insulin ([place]) ?Kritika saw Colletta [place] at her last office visit, and she started the patient on [facility] but she hasn't picked it up from the pharmacy yet.  ? ?Assessment/Plan:  ?No orders of the defined types were placed in this encounter. ? ? ?There are no discontinued medications.  ? ?No orders of the defined types were placed in this encounter. ?  ? ?1. Type 2 diabetes mellitus with other specified complication, without long-term current use of insulin ([place]) ?Ricki is to call the pharmacy about the medicine and picking it up. She is not on any other diabetes mellitus medications. She has a glucometer, and she is to check her blood sugars at home. ? ?2. Obesity with current BMI of 69.0 ?Yulieth is currently in the action stage of change. As such, her goal is to continue with weight loss efforts. She has agreed to the Category 3 Plan.  ? ?Exercise goals: As is.  ? ?Behavioral modification strategies: increasing  lean protein intake, decreasing simple carbohydrates, and planning for success. ? ?Guerline has agreed to follow-up with our clinic in 3 weeks. She was informed of the importance of frequent follow-up visits to maximize her success with intensive lifestyle modifications for her multiple health conditions.  ? ?Objective:  ? ?Blood pressure 104/61, pulse 84, temperature 98 ?F (36.7 ?C), height 5' 4" (1.626 m), weight (!) 401 lb (181.9 kg), SpO2 98 %, unknown if currently breastfeeding. ?Body mass index is 68.83 kg/m?. ? ?General: Cooperative, alert, well developed, in no acute distress. ?HEENT: Conjunctivae and lids unremarkable. ?Cardiovascular: Regular rhythm.  ?Lungs: Normal work of breathing. ?Neurologic: No focal deficits.  ? ?Lab Results  ?Component Value Date  ? CREATININE 0.88 10/13/2021  ? BUN 12 10/13/2021  ? NA 140 10/13/2021  ? K 4.1 10/13/2021  ? CL 95 (L) 10/13/2021  ? CO2 31 (H) 10/13/2021  ? ?Lab Results  ?Component Value Date  ? ALT 19 10/13/2021  ? AST 20 10/13/2021  ? ALKPHOS 118 10/13/2021  ? BILITOT 0.4 10/13/2021  ? ?Lab Results  ?Component Value Date  ? HGBA1C 6.1 (H) 10/13/2021  ? HGBA1C 5.9 11/26/2020  ? HGBA1C 5.5 06/04/2020  ? HGBA1C 6.2 11/20/2019  ? HGBA1C 6.2 09/15/2016  ? ?Lab Results  ?Component Value Date  ? INSULIN 23.6 10/13/2021  ? ?Lab Results  ?Component Value Date  ? TSH 1.680 10/13/2021  ? ?Lab Results  ?Component Value Date  ? CHOL 184 10/13/2021  ? HDL 59 10/13/2021  ?  LDLCALC 108 (H) 10/13/2021  ? LDLDIRECT 99.0 09/15/2016  ? TRIG 96 10/13/2021  ? CHOLHDL 4 11/26/2020  ? ?No results found for: VD25OH ?Lab Results  ?Component Value Date  ? WBC 6.6 10/13/2021  ? HGB 11.5 10/13/2021  ? HCT 35.2 10/13/2021  ? MCV 89 10/13/2021  ? PLT 260 10/13/2021  ? ?Lab Results  ?Component Value Date  ? IRON 54 09/19/2016  ? TIBC 227 (L) 09/19/2016  ? ? ?Obesity Behavioral Intervention:  ? ?Approximately 15 minutes were spent on the discussion below. ? ?ASK: ?We discussed the diagnosis of  obesity with Marily today and Elnor agreed to give [place] permission to discuss obesity behavioral modification therapy today. ? ?ASSESS: ?Lorisa has the diagnosis of obesity and her BMI today is 69.0. Jarita is in the action stage of change.  ? ?ADVISE: ?Shamina was educated on the multiple health risks of obesity as well as the benefit of weight loss to improve her health. She was advised of the need for long term treatment and the importance of lifestyle modifications to improve her current health and to decrease her risk of future health problems. ? ?AGREE: ?Multiple dietary modification options and treatment options were discussed and Audre agreed to follow the recommendations documented in the above note. ? ?ARRANGE: ?Kennede was educated on the importance of frequent visits to treat obesity as outlined per CMS and USPSTF guidelines and agreed to schedule her next follow up appointment today. ? ?Attestation Statements:  ? ?Reviewed by clinician on day of visit: allergies, medications, problem list, medical history, surgical history, family history, social history, and previous encounter notes. ? ? ?I, Trixie Dredge, am acting as transcriptionist for [employer], DO. ? ?I have reviewed the above documentation for accuracy and completeness, and I agree with the above. Marjory Sneddon, D.O. ? ?The [place] was signed into law in 2016 which includes the topic of electronic health records.  This provides immediate access to information in MyChart.  This includes consultation notes, operative notes, office notes, lab results and pathology reports.  If you have any questions about what you read please let us know at your next visit so we can discuss your concerns and take corrective action if need be.  We are right here with you. ? ? ?

## 2021-12-01 NOTE — Telephone Encounter (Signed)
Prior authorization denied for [facility]. Per insurance: Medicare part D does not cover. Patient has Medicare and is not eligible to use copay card. Patient sent denial message via mychart.  ?

## 2021-12-07 ENCOUNTER — Ambulatory Visit: Payer: Medicare Other | Admitting: Nurse Practitioner

## 2021-12-07 ENCOUNTER — Ambulatory Visit (HOSPITAL_COMMUNITY): Admission: RE | Admit: 2021-12-07 | Payer: Medicare Other | Source: Ambulatory Visit

## 2021-12-07 ENCOUNTER — Ambulatory Visit (HOSPITAL_COMMUNITY): Payer: Medicare Other

## 2021-12-07 ENCOUNTER — Telehealth: Payer: Self-pay | Admitting: Nurse Practitioner

## 2021-12-07 NOTE — Progress Notes (Signed)
?  Office: [phone]  /  Fax: [phone] ? ? ? ?Date: 12/21/2021   ?Appointment Start Time: 8:30am ?Duration: 23 minutes ?Provider: Glennie Isle, Psy.D. ?Type of Session: Individual Therapy  ?Location of Patient: Home (private location) ?Location of Provider: Provider's Home (private office) ?Type of Contact: Telepsychological Visit via MyChart Video Visit ? ?Session Content: [name] [name] is a 41 y.o. female presenting for a follow-up appointment to address the previously established treatment goal of increasing coping skills.Today's appointment was a telepsychological visit due to COVID-19. Aziya provided verbal consent for today's telepsychological appointment and she is aware she is responsible for securing confidentiality on her end of the session. Prior to proceeding with today's appointment, Jodilyn's physical location at the time of this appointment was obtained as well a phone number she could be reached at in the event of technical difficulties. Zairah and this provider participated in today's telepsychological service.  ? ?This provider conducted a brief check-in. Ziomara stated, "Everything seems to be going good." However, she indicated some challenges last week as it relates to following the structured meal plan. Further explored and processed. She was unable to identify any significant stressors, noting she deviated from her structured meal plan. Reviewed triggers for emotional eating behaviors. She also described engaging in negative self-talk when she deviated from her structured meal plan. Psychoeducation provided regarding all or nothing thinking. Moreover, psychoeducation provided regarding self-compassion. Shaely was engaged in a self-compassion exercise to help with eating-related challenges and other ongoing stressors. She was encouraged to regularly ask herself, ?What do I need right now?" Overall, Brelee was receptive to today's appointment as evidenced by openness to sharing,  responsiveness to feedback, and  willingness to work toward increasing self-compassion . ? ?Mental Status Examination:  ?Appearance: neat ?Behavior: appropriate to circumstances ?Mood: neutral ?Affect: mood congruent ?Speech: WNL ?Eye Contact: appropriate ?Psychomotor Activity: WNL ?Gait: unable to assess ?Thought Process: linear, logical, and goal directed and no evidence or endorsement of suicidal, homicidal, and self-harm ideation, plan and intent  ?Thought Content/Perception: no hallucinations, delusions, bizarre thinking or behavior endorsed or observed ?Orientation: AAOx4 ?Memory/Concentration: memory, attention, language, and fund of knowledge intact  ?Insight: good ?Judgment: fair ? ?Interventions:  ?Conducted a brief chart review ?Provided empathic reflections and validation ?Reviewed content from the previous session ?Employed supportive psychotherapy interventions to facilitate reduced distress and to improve coping skills with identified stressors ?Psychoeducation provided regarding all-or-nothing thinking ?Psychoeducation provided regarding self-compassion ?Engaged pt in a self-compassion exercise ? ?DSM-5 Diagnosis(es): F50.89 Other Specified Feeding or Eating Disorder, Emotional Eating Behaviors ? ?Treatment Goal & Progress: During the initial appointment with this provider, the following treatment goal was established: increase coping skills. Deneise has demonstrated progress in her goal as evidenced by increased awareness of hunger patterns and increased awareness of triggers for emotional eating behaviors.  ? ?Plan: The next appointment is scheduled for 01/11/2022 at 8:30am, which will be via MyChart Video Visit. The next session will focus on  discussing self-compassion further . ? ?

## 2021-12-07 NOTE — Telephone Encounter (Signed)
no show letter mailed 4/17 ym  ?

## 2021-12-14 ENCOUNTER — Telehealth: Payer: Self-pay | Admitting: Nurse Practitioner

## 2021-12-14 NOTE — Telephone Encounter (Signed)
Left message for patient to call back and schedule Medicare Annual Wellness Visit (AWV).   Please offer to do virtually or by telephone.  Left office number and my jabber #[phone].  Last AWV:03/13/2020  Please schedule at anytime with Nurse Health Advisor.   

## 2021-12-15 ENCOUNTER — Other Ambulatory Visit: Payer: Self-pay | Admitting: Cardiology

## 2021-12-17 NOTE — Telephone Encounter (Signed)
2nd no show, fee generated, letter sent  ?

## 2021-12-21 ENCOUNTER — Telehealth (INDEPENDENT_AMBULATORY_CARE_PROVIDER_SITE_OTHER): Payer: Medicare Other | Admitting: Psychology

## 2021-12-21 DIAGNOSIS — F5089 Other specified eating disorder: Secondary | ICD-10-CM

## 2021-12-22 ENCOUNTER — Encounter (INDEPENDENT_AMBULATORY_CARE_PROVIDER_SITE_OTHER): Payer: Self-pay | Admitting: Family Medicine

## 2021-12-22 ENCOUNTER — Ambulatory Visit (INDEPENDENT_AMBULATORY_CARE_PROVIDER_SITE_OTHER): Payer: Medicare Other | Admitting: Family Medicine

## 2021-12-22 VITALS — BP 126/69 | HR 90 | Temp 98.3°F | Ht 64.0 in | Wt >= 6400 oz

## 2021-12-22 DIAGNOSIS — Z6841 Body Mass Index (BMI) 40.0 and over, adult: Secondary | ICD-10-CM | POA: Diagnosis not present

## 2021-12-22 DIAGNOSIS — E1169 Type 2 diabetes mellitus with other specified complication: Secondary | ICD-10-CM | POA: Diagnosis not present

## 2021-12-22 DIAGNOSIS — E669 Obesity, unspecified: Secondary | ICD-10-CM

## 2021-12-22 DIAGNOSIS — Z7984 Long term (current) use of oral hypoglycemic drugs: Secondary | ICD-10-CM

## 2021-12-22 MED ORDER — RYBELSUS 3 MG PO TABS: 3.0000 mg | ORAL_TABLET | Freq: Every day | ORAL | 0 refills | Status: DC

## 2021-12-31 NOTE — Progress Notes (Signed)
  Office: [phone]  /  Fax: [phone]    Date: 01/11/2022   Appointment Start Time: 8:32am Duration: 25 minutes Provider: Glennie Isle, Psy.D. Type of Session: Individual Therapy  Location of Patient: Home (private location) Location of Provider: Provider's Home (private office) Type of Contact: Telepsychological Visit via MyChart Video Visit  Session Content: [name] [name] is a 41 y.o. female presenting for a follow-up appointment to address the previously established treatment goal of increasing coping skills.Today's appointment was a telepsychological visit due to COVID-19. Nekeshia provided verbal consent for today's telepsychological appointment and she is aware she is responsible for securing confidentiality on her end of the session. Prior to proceeding with today's appointment, [name]'s physical location at the time of this appointment was obtained as well a phone number she could be reached at in the event of technical difficulties. [name] and this provider participated in today's telepsychological service.   This provider conducted a brief check-in. [name] shared she has "been moving around and going places," including graduation parties. She discussed making better choices and engaging in portion control during celebrations. [name] also reported a reduction in emotional eating behaviors. Reviewed self-compassion from the last appointment. She acknowledged she did not regularly check-in with her self as previously discussed, which she attributes to a reduction in stressors. Moreover, [name] was engaged in problem solving to develop a plan to help cope with urges/cravings involving activities to relax, activities to distract, comforting places, people to call and connect with, and activities that help soothe senses. She was observed writing the plan. Furthermore, termination planning was discussed. [name] was receptive to a follow-up appointment in 3-4 weeks and an additional  follow-up/termination appointment in 3-4 weeks after that. Overall, [name] was receptive to today's appointment as evidenced by openness to sharing, responsiveness to feedback, and willingness to implement discussed strategies .  Mental Status Examination:  Appearance: neat Behavior: appropriate to circumstances Mood: neutral Affect: mood congruent Speech: WNL Eye Contact: appropriate Psychomotor Activity: WNL Gait: unable to assess Thought Process: linear, logical, and goal directed and no evidence or endorsement of suicidal, homicidal, and self-harm ideation, plan and intent  Thought Content/Perception: no hallucinations, delusions, bizarre thinking or behavior endorsed or observed Orientation: AAOx4 Memory/Concentration: memory, attention, language, and fund of knowledge intact  Insight: good Judgment: good  Interventions:  Conducted a brief chart review Provided empathic reflections and validation Reviewed content from the previous session Employed supportive psychotherapy interventions to facilitate reduced distress and to improve coping skills with identified stressors Engaged patient in problem solving Engaged pt in termination planning   DSM-5 Diagnosis(es): F50.89 Other Specified Feeding or Eating Disorder, Emotional Eating Behaviors  Treatment Goal & Progress: During the initial appointment with this provider, the following treatment goal was established: increase coping skills. [name] has demonstrated progress in her goal as evidenced by increased awareness of hunger patterns, increased awareness of triggers for emotional eating behaviors, and reduction in emotional eating behaviors . [name] also continues to demonstrate willingness to engage in learned skill(s).  Plan: The next appointment is scheduled for 02/02/2022 at 10am, which will be via MyChart Video Visit. The next session will focus on working towards the established treatment goal. [name] will complete and  return the forms previously shared with her by front desk staff.

## 2022-01-01 ENCOUNTER — Telehealth: Payer: Self-pay | Admitting: Nurse Practitioner

## 2022-01-01 NOTE — Telephone Encounter (Signed)
Left message for patient to call back and schedule Medicare Annual Wellness Visit (AWV).  ? ?Please offer to do virtually or by telephone.  Left office number and my jabber [phone]. ? ?Last AWV:03/13/2020 ? ?Please schedule at anytime with Nurse Health Advisor. ?  ?

## 2022-01-06 NOTE — Progress Notes (Signed)
Chief Complaint:   OBESITY [name] [name] is here to discuss her progress with her obesity treatment plan along with follow-up of her obesity related diagnoses. [name] [name] is on the Category 3 Plan and states she is following her eating plan approximately 80% of the time. [name] [name] states she is walking for 20 minutes 3 times per week.  Today's visit was #: 5 Starting weight: 432 lbs Starting date: 10/13/2021 Today's weight: 409 lbs Today's date: 12/22/2021 Total lbs lost to date: 23 Total lbs lost since last in-office visit: 0  Interim History: [name] [name] had been doing very well with weight loss except for the last couple of weeks. She isn't eating more simple carbohydrates and not eating all of the food on her plan.  Subjective:   1. Type 2 diabetes mellitus with other specified complication, without long-term current use of insulin ([facility]) [name] [name] was prescribed Mounjaro, but her insurance would not cover. She would benefit from a GLP-1  Assessment/Plan:   1. Type 2 diabetes mellitus with other specified complication, without long-term current use of insulin ([facility]) [name] [name] agreed to start Rybelsus 3 mg daily with no refills. She may retry Mounjaro in the future.   - Semaglutide (RYBELSUS) 3 MG TABS; Take 3 mg by mouth daily.  Dispense: 30 tablet; Refill: 0  2. Obesity, Current BMI 70.3 [name] [name] is currently in the action stage of change. As such, her goal is to continue with weight loss efforts. She has agreed to the Category 3 Plan.   Exercise goals: As is.  Behavioral modification strategies: increasing lean protein intake and no skipping meals.  [name] [name] has agreed to follow-up with our clinic in 3 weeks. She was informed of the importance of frequent follow-up visits to maximize her success with intensive lifestyle modifications for her multiple health conditions.   Objective:   Blood pressure 126/69, pulse 90, temperature 98.3 F (36.8 C), height _0  (1.626 m), weight (!) 409 lb  (185.5 kg), SpO2 90 %, unknown if currently breastfeeding. Body mass index is 70.2 kg/m.  General: Cooperative, alert, well developed, in no acute distress. HEENT: Conjunctivae and lids unremarkable. Cardiovascular: Regular rhythm.  Lungs: Normal work of breathing. Neurologic: No focal deficits.   Lab Results  Component Value Date   CREATININE 0.88 10/13/2021   BUN 12 10/13/2021   NA 140 10/13/2021   K 4.1 10/13/2021   CL 95 (L) 10/13/2021   CO2 31 (H) 10/13/2021   Lab Results  Component Value Date   ALT 19 10/13/2021   AST 20 10/13/2021   ALKPHOS 118 10/13/2021   BILITOT 0.4 10/13/2021   Lab Results  Component Value Date   HGBA1C 6.1 (H) 10/13/2021   HGBA1C 5.9 11/26/2020   HGBA1C 5.5 06/04/2020   HGBA1C 6.2 11/20/2019   HGBA1C 6.2 09/15/2016   Lab Results  Component Value Date   INSULIN 23.6 10/13/2021   Lab Results  Component Value Date   TSH 1.680 10/13/2021   Lab Results  Component Value Date   CHOL 184 10/13/2021   HDL 59 10/13/2021   LDLCALC 108 (H) 10/13/2021   LDLDIRECT 99.0 09/15/2016   TRIG 96 10/13/2021   CHOLHDL 4 11/26/2020   No results found for: VD25OH Lab Results  Component Value Date   WBC 6.6 10/13/2021   HGB 11.5 10/13/2021   HCT 35.2 10/13/2021   MCV 89 10/13/2021   PLT 260 10/13/2021   Lab Results  Component Value Date   IRON 54 09/19/2016   TIBC 227 (  L) 09/19/2016   Attestation Statements:   Reviewed by clinician on day of visit: allergies, medications, problem list, medical history, surgical history, family history, social history, and previous encounter notes.  Time spent on visit including pre-visit chart review and post-visit care and charting was 30 minutes.    I, Trixie Dredge, am acting as transcriptionist for Dennard Nip, MD.  I have reviewed the above documentation for accuracy and completeness, and I agree with the above. -  Dennard Nip, MD

## 2022-01-07 ENCOUNTER — Ambulatory Visit (INDEPENDENT_AMBULATORY_CARE_PROVIDER_SITE_OTHER): Payer: Medicare Other | Admitting: Family Medicine

## 2022-01-11 ENCOUNTER — Telehealth (INDEPENDENT_AMBULATORY_CARE_PROVIDER_SITE_OTHER): Payer: Medicare Other | Admitting: Psychology

## 2022-01-11 DIAGNOSIS — F5089 Other specified eating disorder: Secondary | ICD-10-CM

## 2022-01-19 NOTE — Progress Notes (Unsigned)
   Acute Office Visit  Subjective:     Patient ID: [name] [name], female    DOB: 1981/07/16, 41 y.o.   MRN: [id number]  No chief complaint on file.   HPI Patient is in today for returning cellulitis.   ROS      Objective:    There were no vitals taken for this visit. {Vitals History (Optional):23777}  Physical Exam  No results found for any visits on 01/20/22.      Assessment & Plan:   Problem List Items Addressed This Visit   None   No orders of the defined types were placed in this encounter.   No follow-ups on file.  Charyl Dancer, NP

## 2022-01-19 NOTE — Progress Notes (Signed)
  Office: [phone]  /  Fax: [phone]    Date: 02/02/2022   Appointment Start Time: 10:01am Duration: 21 minutes Provider: Glennie Isle, Psy.D. Type of Session: Individual Therapy  Location of Patient: Home (private location) Location of Provider: Provider's Home (private office) Type of Contact: Telepsychological Visit via MyChart Video Visit  Session Content: [name] [name] is a 41 y.o. female presenting for a follow-up appointment to address the previously established treatment goal of increasing coping skills.Today's appointment was a telepsychological visit. [name] [name] provided verbal consent for today's telepsychological appointment and she is aware she is responsible for securing confidentiality on her end of the session. Prior to proceeding with today's appointment, [name] [name]'s physical location at the time of this appointment was obtained as well a phone number she could be reached at in the event of technical difficulties. [name] [name] and this provider participated in today's telepsychological service.   This provider conducted a brief check-in. [name] [name] shared she has been sick for the past couple days; it was recommended she reach out to her PCP if symptoms persist/worsen. She agreed. Notably, she discussed challenges with her eating habits, especially due to recent events. Further explored and processed. She clarified she deviated from her structured meal plan, but has not engaged in emotional eating behaviors. She also discussed utilizing the previously developed plan to help with overall coping. Remainder of today's session focused further on self-compassion as she previously discussed engaging in negative self-talk when deviating from her structured meal plan. She acknowledged being more in the moment and giving her self grace has helped reduce negative self-talk. She was engaged in a self-compassion exercise (Body Loving-Kindness) and her experience was processed. [name] [name] provided verbal  consent during today's appointment for this provider to send the handout for today's exercise via e-mail. Overall, [name] [name] was receptive to today's appointment as evidenced by openness to sharing, responsiveness to feedback, and willingness to continue engaging in learned skills.  Mental Status Examination:  Appearance: neat Behavior: appropriate to circumstances Mood: neutral Affect: mood congruent Speech: WNL Eye Contact: appropriate Psychomotor Activity: WNL Gait: unable to assess Thought Process: linear, logical, and goal directed and no evidence or endorsement of suicidal, homicidal, and self-harm ideation, plan and intent  Thought Content/Perception: no hallucinations, delusions, bizarre thinking or behavior endorsed or observed Orientation: AAOx4 Memory/Concentration: memory, attention, language, and fund of knowledge intact  Insight: good Judgment: good  Interventions:  Conducted a brief chart review Provided empathic reflections and validation Reviewed content from the previous session Employed supportive psychotherapy interventions to facilitate reduced distress and to improve coping skills with identified stressors Engaged pt in a self-compassion exercise  DSM-5 Diagnosis(es): F50.89 Other Specified Feeding or Eating Disorder, Emotional Eating Behaviors  Treatment Goal & Progress: During the initial appointment with this provider, the following treatment goal was established: increase coping skills. [name] [name] has demonstrated progress in her goal as evidenced by increased awareness of hunger patterns, increased awareness of triggers for emotional eating behaviors, and reduction in emotional eating behaviors . [name] [name] also continues to demonstrate willingness to engage in learned skill(s).  Plan: The next appointment is scheduled for 03/01/2022 at 10am, which will be via MyChart Video Visit. The next session will focus on working towards the established treatment goal and  termination.

## 2022-01-20 ENCOUNTER — Ambulatory Visit (INDEPENDENT_AMBULATORY_CARE_PROVIDER_SITE_OTHER): Payer: Medicare Other | Admitting: Nurse Practitioner

## 2022-01-20 ENCOUNTER — Encounter: Payer: Self-pay | Admitting: Nurse Practitioner

## 2022-01-20 ENCOUNTER — Ambulatory Visit (INDEPENDENT_AMBULATORY_CARE_PROVIDER_SITE_OTHER): Payer: Medicare Other | Admitting: Family Medicine

## 2022-01-20 VITALS — Ht 64.0 in | Wt >= 6400 oz

## 2022-01-20 VITALS — BP 138/88 | HR 84 | Temp 97.8°F | Wt >= 6400 oz

## 2022-01-20 DIAGNOSIS — L03116 Cellulitis of left lower limb: Secondary | ICD-10-CM

## 2022-01-20 MED ORDER — SULFAMETHOXAZOLE-TRIMETHOPRIM 800-160 MG PO TABS: 2.0000 | ORAL_TABLET | Freq: Two times a day (BID) | ORAL | 0 refills | Status: DC

## 2022-01-20 NOTE — Assessment & Plan Note (Signed)
Cellulitis to left thigh.  She tolerated Bactrim last time and it cleared up her prior cellulitis.  Start Bactrim 2 tabs twice daily.  Keep area clean and dry.  Follow-up if symptoms worsen or do not improve

## 2022-01-20 NOTE — Patient Instructions (Signed)
It was great to see you!  Start bactrim 2 tablets twice a day with food (total of 4 tablets daily) for your cellulitis.   Let's follow-up in if your symptoms don't improve or worsen.   Take care,  Vance Peper, NP

## 2022-01-21 ENCOUNTER — Telehealth (INDEPENDENT_AMBULATORY_CARE_PROVIDER_SITE_OTHER): Payer: Medicare Other | Admitting: Family Medicine

## 2022-01-21 ENCOUNTER — Encounter (INDEPENDENT_AMBULATORY_CARE_PROVIDER_SITE_OTHER): Payer: Self-pay | Admitting: Family Medicine

## 2022-01-21 DIAGNOSIS — F39 Unspecified mood [affective] disorder: Secondary | ICD-10-CM | POA: Diagnosis not present

## 2022-01-21 DIAGNOSIS — Z6841 Body Mass Index (BMI) 40.0 and over, adult: Secondary | ICD-10-CM

## 2022-01-21 DIAGNOSIS — E669 Obesity, unspecified: Secondary | ICD-10-CM

## 2022-01-21 DIAGNOSIS — E1169 Type 2 diabetes mellitus with other specified complication: Secondary | ICD-10-CM | POA: Diagnosis not present

## 2022-01-21 DIAGNOSIS — Z7984 Long term (current) use of oral hypoglycemic drugs: Secondary | ICD-10-CM

## 2022-01-21 MED ORDER — RYBELSUS 7 MG PO TABS
7.0000 mg | ORAL_TABLET | Freq: Every day | ORAL | 0 refills | Status: DC
Start: 2022-01-21 — End: 2022-02-11

## 2022-01-21 NOTE — Progress Notes (Signed)
TeleHealth Visit:  This visit was completed with telemedicine (audio/video) technology. [name] [name] has verbally consented to this TeleHealth visit. The patient is located at home, the provider is located at home. The participants in this visit include the listed provider and patient. The visit was conducted today via MyChart video.  OBESITY [name] [name] is here to discuss her progress with her obesity treatment plan along with follow-up of her obesity related diagnoses.   Today's visit was # 6 Starting weight: 432 lbs Starting date: 10/13/2021 Weight at last in office visit: 409 lbs on 12/22/21 Total weight loss: 23 lbs at last in office visit on 12/22/21. Today's reported weight: 406 lbs at PCP yesterday.  Nutrition Plan: the Category 3 Plan 70% adherence.  Hunger is well controlled. Cravings are moderately controlled.  Current exercise: walks for 15-20 minutes twice weekly.  Interim History: [name] [name] is doing well on the plan.  She had lost 3 pounds according to her PCP office scales yesterday for a total of 23 lbs. She lives with her mom, stepdad, and 25-year-old daughter.  Her mom cooks dinner but does not always cook healthy foods.  However mom is trying to do better since she is trying to lose weight also. She tends to eat breakfast late and then skip lunch.  Snacks between lunch and dinner. Tends to craves sweets but she is not keeping any sweets in the house currently. Denies intake of caloric beverages. 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  Her goal is to get off of the oxygen.  Prefers in office visit so she can be weighed.  She lives near [place] and it takes her about an hour to get to our office.  Assessment/Plan:  1. Type II Diabetes HgbA1c is at goal. CBGs: Not checking Episodes of hypoglycemia? no Medication(s): Rybelsus 3 mg daily.  Taking correctly in a.m. on empty stomach. Denies side effects.  Notes smaller portions and some appetite  suppression.  Lab Results  Component Value Date   HGBA1C 6.1 (H) 10/13/2021   HGBA1C 5.9 11/26/2020   HGBA1C 5.5 06/04/2020   Lab Results  Component Value Date   MICROALBUR 2.5 (H) 11/26/2020   LDLCALC 108 (H) 10/13/2021   CREATININE 0.88 10/13/2021    Plan: Increase dose. Refill Rybelsus 7 mg every morning before breakfast.  2.  Mood disorder with emotional eating Mood stable.  On Celexa 10 mg daily.  Denies stress eating.  Has followed up with Dr. Mallie Mussel on June 13.  Plan: Follow-up with Dr. Mallie Mussel June 13. Avoid keeping sweets in the home.  3. Obesity: Current BMI 69.79 [name] [name] is currently in the action stage of change. As such, her goal is to continue with weight loss efforts.  She has agreed to the Category 3 Plan.   She will eat lunch food and afternoon when she feels like snacking.  Exercise goals: Continue to increase frequency and duration of walking. Told her she may have the benefit for Silver sneakers and she will look into this.  Behavioral modification strategies: increasing lean protein intake, decreasing simple carbohydrates, no skipping meals, meal planning and cooking strategies, and dealing with family or coworker sabotage.  [name] [name] has agreed to follow-up with our clinic in 2 weeks.   No orders of the defined types were placed in this encounter.   Medications Discontinued During This Encounter  Medication Reason   Semaglutide (RYBELSUS) 3 MG TABS Dose change     Meds ordered this encounter  Medications   Semaglutide (RYBELSUS)  7 MG TABS    Sig: Take 7 mg by mouth daily before breakfast.    Dispense:  30 tablet    Refill:  0    Order Specific Question:   Supervising Provider    Answer:   Dennard Nip D [[id number]]      Objective:   VITALS: Per patient if applicable, see vitals. GENERAL: Alert and in no acute distress. CARDIOPULMONARY: No increased WOB. Speaking in clear sentences.  PSYCH: Pleasant and cooperative. Speech normal rate and  rhythm. Affect is appropriate. Insight and judgement are appropriate. Attention is focused, linear, and appropriate.  NEURO: Oriented as arrived to appointment on time with no prompting.   Lab Results  Component Value Date   CREATININE 0.88 10/13/2021   BUN 12 10/13/2021   NA 140 10/13/2021   K 4.1 10/13/2021   CL 95 (L) 10/13/2021   CO2 31 (H) 10/13/2021   Lab Results  Component Value Date   ALT 19 10/13/2021   AST 20 10/13/2021   ALKPHOS 118 10/13/2021   BILITOT 0.4 10/13/2021   Lab Results  Component Value Date   HGBA1C 6.1 (H) 10/13/2021   HGBA1C 5.9 11/26/2020   HGBA1C 5.5 06/04/2020   HGBA1C 6.2 11/20/2019   HGBA1C 6.2 09/15/2016   Lab Results  Component Value Date   INSULIN 23.6 10/13/2021   Lab Results  Component Value Date   TSH 1.680 10/13/2021   Lab Results  Component Value Date   CHOL 184 10/13/2021   HDL 59 10/13/2021   LDLCALC 108 (H) 10/13/2021   LDLDIRECT 99.0 09/15/2016   TRIG 96 10/13/2021   CHOLHDL 4 11/26/2020   Lab Results  Component Value Date   WBC 6.6 10/13/2021   HGB 11.5 10/13/2021   HCT 35.2 10/13/2021   MCV 89 10/13/2021   PLT 260 10/13/2021   Lab Results  Component Value Date   IRON 54 09/19/2016   TIBC 227 (L) 09/19/2016   No results found for: VD25OH  Attestation Statements:   Reviewed by clinician on day of visit: allergies, medications, problem list, medical history, surgical history, family history, social history, and previous encounter notes.

## 2022-02-02 ENCOUNTER — Telehealth (INDEPENDENT_AMBULATORY_CARE_PROVIDER_SITE_OTHER): Payer: Medicare Other | Admitting: Psychology

## 2022-02-02 DIAGNOSIS — F5089 Other specified eating disorder: Secondary | ICD-10-CM

## 2022-02-04 ENCOUNTER — Ambulatory Visit (INDEPENDENT_AMBULATORY_CARE_PROVIDER_SITE_OTHER): Payer: Medicare Other | Admitting: Family Medicine

## 2022-02-11 ENCOUNTER — Encounter (INDEPENDENT_AMBULATORY_CARE_PROVIDER_SITE_OTHER): Payer: Self-pay | Admitting: Family Medicine

## 2022-02-11 ENCOUNTER — Ambulatory Visit (INDEPENDENT_AMBULATORY_CARE_PROVIDER_SITE_OTHER): Payer: Medicare Other | Admitting: Family Medicine

## 2022-02-11 VITALS — BP 145/71 | HR 83 | Temp 98.0°F | Ht 64.0 in | Wt 395.0 lb

## 2022-02-11 DIAGNOSIS — Z6841 Body Mass Index (BMI) 40.0 and over, adult: Secondary | ICD-10-CM

## 2022-02-11 DIAGNOSIS — Z7984 Long term (current) use of oral hypoglycemic drugs: Secondary | ICD-10-CM

## 2022-02-11 DIAGNOSIS — E1169 Type 2 diabetes mellitus with other specified complication: Secondary | ICD-10-CM

## 2022-02-11 DIAGNOSIS — E669 Obesity, unspecified: Secondary | ICD-10-CM

## 2022-02-11 MED ORDER — RYBELSUS 7 MG PO TABS: 7.0000 mg | ORAL_TABLET | Freq: Every day | ORAL | 0 refills | Status: DC

## 2022-02-15 NOTE — Progress Notes (Signed)
  Office: [phone]  /  Fax: [phone]    Date: 03/01/2022   Appointment Start Time: 10:10am Duration: 17 minutes Provider: Glennie Isle, Psy.D. Type of Session: Individual Therapy  Location of Patient:  Family's home in [place], [place]  (private location) Location of Provider: Provider's Home (private office) Type of Contact: Telepsychological Visit via MyChart Video Visit  Session Content: Kawana is a 41 y.o. female presenting for a follow-up appointment to address the previously established treatment goal of increasing coping skills.Today's appointment was a telepsychological visit. Annessa provided verbal consent for today's telepsychological appointment and she is aware she is responsible for securing confidentiality on her end of the session. Prior to proceeding with today's appointment, Amere's physical location at the time of this appointment was obtained as well a phone number she could be reached at in the event of technical difficulties. Amita and this provider participated in today's telepsychological service.   This provider conducted a brief check-in. Jerolene shared about recent events. She continues to deny engagement in emotional eating behaviors. A plan was developed to help Deloyce cope with emotional eating behaviors in the future using learned skills. She wrote down the following plan: focus on hydration; be prepared with snacks congruent to the meal plan; pause to ask questions when triggered to eat (e.g., Am I really hungry?, Is there something bothering me?, and Will I feel better if I eat?); and engage in discussed coping strategies after going through the aforementioned questions. Overall, Connie was receptive to today's appointment as evidenced by openness to sharing, responsiveness to feedback, and willingness to continue engaging in learned skills.  Mental Status Examination:  Appearance: neat Behavior: appropriate to circumstances Mood: neutral Affect: mood  congruent Speech: WNL Eye Contact: appropriate Psychomotor Activity: WNL Gait: unable to assess Thought Process: linear, logical, and goal directed and no evidence or endorsement of suicidal, homicidal, and self-harm ideation, plan and intent  Thought Content/Perception: no hallucinations, delusions, bizarre thinking or behavior endorsed or observed Orientation: AAOx4 Memory/Concentration: memory, attention, language, and fund of knowledge intact  Insight: good Judgment: good  Interventions:  Conducted a brief chart review Provided empathic reflections and validation Provided positive reinforcement Employed supportive psychotherapy interventions to facilitate reduced distress and to improve coping skills with identified stressors Reviewed learned skills  DSM-5 Diagnosis(es): F50.89 Other Specified Feeding or Eating Disorder, Emotional Eating Behaviors  Treatment Goal & Progress: During the initial appointment with this provider, the following treatment goal was established: increase coping skills. Dayzha demonstrated progress in her goal as evidenced by increased awareness of hunger patterns, increased awareness of triggers for emotional eating behaviors, and reduction in emotional eating behaviors . Sharlize also continues to demonstrate willingness to engage in learned skill(s).  Plan: As previously planned, today was Damani's last appointment with this provider. She acknowledged understanding that she may request a follow-up appointment with this provider in the future as long as she is still established with the clinic. No further follow-up planned by this provider.

## 2022-03-01 ENCOUNTER — Telehealth (INDEPENDENT_AMBULATORY_CARE_PROVIDER_SITE_OTHER): Payer: Medicare Other | Admitting: Psychology

## 2022-03-01 DIAGNOSIS — F5089 Other specified eating disorder: Secondary | ICD-10-CM | POA: Diagnosis not present

## 2022-03-02 ENCOUNTER — Ambulatory Visit: Payer: Medicare Other

## 2022-03-06 ENCOUNTER — Other Ambulatory Visit: Payer: Self-pay | Admitting: Physician Assistant

## 2022-03-10 ENCOUNTER — Ambulatory Visit (INDEPENDENT_AMBULATORY_CARE_PROVIDER_SITE_OTHER): Payer: Medicare Other | Admitting: Family Medicine

## 2022-03-10 ENCOUNTER — Ambulatory Visit: Payer: Medicare Other

## 2022-03-11 ENCOUNTER — Other Ambulatory Visit: Payer: Self-pay | Admitting: Cardiology

## 2022-03-12 ENCOUNTER — Other Ambulatory Visit: Payer: Self-pay | Admitting: Cardiology

## 2022-03-12 DIAGNOSIS — I10 Essential (primary) hypertension: Secondary | ICD-10-CM

## 2022-03-12 DIAGNOSIS — I5032 Chronic diastolic (congestive) heart failure: Secondary | ICD-10-CM

## 2022-03-18 ENCOUNTER — Other Ambulatory Visit: Payer: Self-pay | Admitting: Cardiology

## 2022-03-18 ENCOUNTER — Telehealth: Payer: Self-pay | Admitting: Nurse Practitioner

## 2022-03-18 NOTE — Telephone Encounter (Signed)
Left message for patient to call back and schedule Medicare Annual Wellness Visit (AWV).   Please offer to do virtually or by telephone.  Left office number and my jabber #[phone].  Last AWV:03/13/2020  Please schedule at anytime with Nurse Health Advisor.   

## 2022-03-22 ENCOUNTER — Ambulatory Visit (INDEPENDENT_AMBULATORY_CARE_PROVIDER_SITE_OTHER): Payer: Medicare Other | Admitting: Family Medicine

## 2022-03-22 ENCOUNTER — Encounter (INDEPENDENT_AMBULATORY_CARE_PROVIDER_SITE_OTHER): Payer: Self-pay | Admitting: Family Medicine

## 2022-03-22 VITALS — BP 145/87 | HR 86 | Temp 97.9°F | Ht 64.0 in | Wt >= 6400 oz

## 2022-03-22 DIAGNOSIS — Z7984 Long term (current) use of oral hypoglycemic drugs: Secondary | ICD-10-CM

## 2022-03-22 DIAGNOSIS — E559 Vitamin D deficiency, unspecified: Secondary | ICD-10-CM

## 2022-03-22 DIAGNOSIS — Z6841 Body Mass Index (BMI) 40.0 and over, adult: Secondary | ICD-10-CM

## 2022-03-22 DIAGNOSIS — E1169 Type 2 diabetes mellitus with other specified complication: Secondary | ICD-10-CM | POA: Diagnosis not present

## 2022-03-22 DIAGNOSIS — I10 Essential (primary) hypertension: Secondary | ICD-10-CM

## 2022-03-22 DIAGNOSIS — E669 Obesity, unspecified: Secondary | ICD-10-CM

## 2022-03-22 DIAGNOSIS — E7849 Other hyperlipidemia: Secondary | ICD-10-CM

## 2022-03-22 MED ORDER — RYBELSUS 7 MG PO TABS: 7.0000 mg | ORAL_TABLET | Freq: Every day | ORAL | 0 refills | Status: DC

## 2022-03-25 ENCOUNTER — Encounter (INDEPENDENT_AMBULATORY_CARE_PROVIDER_SITE_OTHER): Payer: Self-pay | Admitting: Family Medicine

## 2022-03-25 DIAGNOSIS — E559 Vitamin D deficiency, unspecified: Secondary | ICD-10-CM | POA: Insufficient documentation

## 2022-03-27 LAB — COMPREHENSIVE METABOLIC PANEL
ALT: 16 IU/L (ref 0–32)
AST: 15 IU/L (ref 0–40)
Albumin/Globulin Ratio: 0.9 — ABNORMAL LOW (ref 1.2–2.2)
Albumin: 3.7 g/dL — ABNORMAL LOW (ref 3.9–4.9)
Alkaline Phosphatase: 122 IU/L — ABNORMAL HIGH (ref 44–121)
BUN/Creatinine Ratio: 10 (ref 9–23)
BUN: 9 mg/dL (ref 6–24)
Bilirubin Total: 0.4 mg/dL (ref 0.0–1.2)
CO2: 30 mmol/L — ABNORMAL HIGH (ref 20–29)
Calcium: 9.1 mg/dL (ref 8.7–10.2)
Chloride: 101 mmol/L (ref 96–106)
Creatinine, Ser: 0.87 mg/dL (ref 0.57–1.00)
Globulin, Total: 4.3 g/dL (ref 1.5–4.5)
Glucose: 92 mg/dL (ref 70–99)
Potassium: 3.9 mmol/L (ref 3.5–5.2)
Sodium: 144 mmol/L (ref 134–144)
Total Protein: 8 g/dL (ref 6.0–8.5)
eGFR: 85 mL/min/{1.73_m2} (ref >59)

## 2022-03-27 LAB — HEMOGLOBIN A1C
Est. average glucose Bld gHb Est-mCnc: 123 mg/dL
Hgb A1c MFr Bld: 5.9 % — ABNORMAL HIGH (ref 4.8–5.6)

## 2022-03-27 LAB — INSULIN, FREE AND TOTAL
Free Insulin: 15 uU/mL
Total Insulin: 15 uU/mL

## 2022-03-27 LAB — VITAMIN D 25 HYDROXY (VIT D DEFICIENCY, FRACTURES): Vit D, 25-Hydroxy: 9.4 ng/mL — ABNORMAL LOW (ref 30.0–100.0)

## 2022-03-30 NOTE — Progress Notes (Unsigned)
Chief Complaint:   OBESITY [name] [name] is here to discuss her progress with her obesity treatment plan along with follow-up of her obesity related diagnoses. [name] [name] is on the Category 3 Plan and states she is following her eating plan approximately 70% of the time. [name] [name] states she is walking for 15-20 minutes 2 times per week.  Today's visit was #: 8 Starting weight: 432 lbs Starting date: 10/13/2021 Today's weight: 401 lbs Today's date: 03/22/2022 Total lbs lost to date: 31 Total lbs lost since last in-office visit: 0  Interim History: [name] [name] gained 6 pounds since her last visit on 02/11/2022.  Rybelsus dose was reduced to 1/2 tablet of a 7 mg dose each morning due to GI side effects.  She is seeing Dr. Mallie Mussel for cognitive behavior therapy.  Has struggled with more celebration eating.  She ended up increasing Rybelsus to 7 mg daily and is doing better on it.  She feels better satiety.  Trying to walk more.  Admits to skipping lunch and eating off the plan for dinner.  Subjective:   1. Benign essential HTN [name] [name]'s blood pressure is elevated today.  She is on Norvasc 5 mg daily, Coreg 25 mg twice daily, hydralazine 100 mg 3 times daily, isosorbide dinitrate 30 mg 3 times daily.   2. Other hyperlipidemia [name] [name] is on Lipitor 20 mg daily.  Last FLP on 10/13/2021, total cholesterol 184, triglycerides 96, HDL 59, and LDL 108.  3. Vitamin D deficiency [name] [name] is not currently on vitamin D replacement.  4. Type 2 diabetes mellitus with other specified complication, without long-term current use of insulin ([name] [name]) [name] [name]'s last A1c on 10/13/2021 was 6.1.  She is on Rybelsus 7 mg daily and is tolerating it better.  She is actively working on dietary changes and increasing walking time.  Assessment/Plan:   1. Benign essential HTN [name] [name] will continue to follow-up with cardiology, and she will continue her medications as prescribed.  2. Other hyperlipidemia [name] [name] will continue to  work on healthy dietary changes and weight loss.  3. Vitamin D deficiency We will check labs today, and we will follow-up at [facility] next office visit.  - Comprehensive metabolic panel - VITAMIN D 25 Hydroxy (Vit-D Deficiency, Fractures)  4. Type 2 diabetes mellitus with other specified complication, without long-term current use of insulin ([facility]) We will check labs today, and we will refill Rybelsus 7 mg once daily for 1 month.  - Semaglutide (RYBELSUS) 7 MG TABS; Take 7 mg by mouth daily before breakfast.  Dispense: 30 tablet; Refill: 0 - Insulin, Free and Total - Hemoglobin A1c  5. Obesity, current BMI- 68.9 [name] [name] is currently in the action stage of change. As such, her goal is to continue with weight loss efforts. She has agreed to the Category 3 Plan.   Exercise goals: Walking 20 minutes 3 times per week.  Behavioral modification strategies: increasing vegetables, increasing water intake, no skipping meals, meal planning and cooking strategies, and celebration eating strategies.  [name] [name] has agreed to follow-up with our clinic in 2 to 3 weeks. She was informed of the importance of frequent follow-up visits to maximize her success with intensive lifestyle modifications for her multiple health conditions.   [name] [name] was informed we would discuss her lab results at her next visit unless there is a critical issue that needs to be addressed sooner. [name] [name] agreed to keep her next visit at the agreed upon time to discuss these results.  Objective:   Blood pressure (!) 145/87,  pulse 86, temperature 97.9 F (36.6 C), height _0  (1.626 m), weight (!) 401 lb (181.9 kg), SpO2 95 %. Body mass index is 68.83 kg/m.  General: Cooperative, alert, well developed, in no acute distress. HEENT: Conjunctivae and lids unremarkable. Cardiovascular: Regular rhythm.  Lungs: Normal work of breathing. Neurologic: No focal deficits.   Lab Results  Component Value Date   CREATININE 0.87  03/22/2022   BUN 9 03/22/2022   NA 144 03/22/2022   K 3.9 03/22/2022   CL 101 03/22/2022   CO2 30 (H) 03/22/2022   Lab Results  Component Value Date   ALT 16 03/22/2022   AST 15 03/22/2022   ALKPHOS 122 (H) 03/22/2022   BILITOT 0.4 03/22/2022   Lab Results  Component Value Date   HGBA1C 5.9 (H) 03/22/2022   HGBA1C 6.1 (H) 10/13/2021   HGBA1C 5.9 11/26/2020   HGBA1C 5.5 06/04/2020   HGBA1C 6.2 11/20/2019   Lab Results  Component Value Date   INSULIN 23.6 10/13/2021   Lab Results  Component Value Date   TSH 1.680 10/13/2021   Lab Results  Component Value Date   CHOL 184 10/13/2021   HDL 59 10/13/2021   LDLCALC 108 (H) 10/13/2021   LDLDIRECT 99.0 09/15/2016   TRIG 96 10/13/2021   CHOLHDL 4 11/26/2020   Lab Results  Component Value Date   VD25OH 9.4 (L) 03/22/2022   Lab Results  Component Value Date   WBC 6.6 10/13/2021   HGB 11.5 10/13/2021   HCT 35.2 10/13/2021   MCV 89 10/13/2021   PLT 260 10/13/2021   Lab Results  Component Value Date   IRON 54 09/19/2016   TIBC 227 (L) 09/19/2016   Attestation Statements:   Reviewed by clinician on day of visit: allergies, medications, problem list, medical history, surgical history, family history, social history, and previous encounter notes.  Wilhemena Durie, am acting as transcriptionist for Loyal Gambler, DO.  I have reviewed the above documentation for accuracy and completeness, and I agree with the above. Dell Ponto, DO

## 2022-03-31 ENCOUNTER — Ambulatory Visit (INDEPENDENT_AMBULATORY_CARE_PROVIDER_SITE_OTHER): Payer: Medicare Other

## 2022-03-31 ENCOUNTER — Encounter (INDEPENDENT_AMBULATORY_CARE_PROVIDER_SITE_OTHER): Payer: Self-pay

## 2022-03-31 DIAGNOSIS — Z1231 Encounter for screening mammogram for malignant neoplasm of breast: Secondary | ICD-10-CM

## 2022-03-31 DIAGNOSIS — Z0000 Encounter for general adult medical examination without abnormal findings: Secondary | ICD-10-CM

## 2022-03-31 NOTE — Progress Notes (Signed)
Subjective:   [name] [name] is a 41 y.o. female who presents for Medicare Annual (Subsequent) preventive examination.   I connected with Celenia Hruska  today by telephone and verified that I am speaking with the correct person using two identifiers. Location patient: home Location provider: work Persons participating in the virtual visit: patient, provider.   I discussed the limitations, risks, security and privacy concerns of performing an evaluation and management service by telephone and the availability of in person appointments. I also discussed with the patient that there may be a patient responsible charge related to this service. The patient expressed understanding and verbally consented to this telephonic visit.    Interactive audio and video telecommunications were attempted between this provider and patient, however failed, due to patient having technical difficulties OR patient did not have access to video capability.  We continued and completed visit with audio only.    Review of Systems     Cardiac Risk Factors include: advanced age (>8mn, >>51women);hypertension     Objective:    Today's Vitals   There is no height or weight on file to calculate BMI.     03/13/2020   12:48 PM 02/18/2020    7:36 AM 11/16/2016    8:14 PM 09/24/2016    1:38 PM 09/15/2016   11:00 PM 09/15/2016   11:51 AM 08/18/2016    2:15 PM  Advanced Directives  Does Patient Have a Medical Advance Directive? _0  No No  Would patient like information on creating a medical advance directive? Yes (MAU/Ambulatory/Procedural Areas - Information given) No - Patient declined No - Patient declined No - Patient declined No - Patient declined      Current Medications (verified) Outpatient Encounter Medications as of 03/31/2022  Medication Sig   amLODipine (NORVASC) 5 MG tablet TAKE 1 TABLET (5 MG TOTAL) BY MOUTH DAILY.   carvedilol (COREG) 25 MG tablet TAKE 1 TABLET BY MOUTH TWICE A DAY    citalopram (CELEXA) 10 MG tablet TAKE 1 TABLET BY MOUTH EVERY DAY   furosemide (LASIX) 40 MG tablet TAKE 1 TABLET BY MOUTH EVERY DAY   hydrALAZINE (APRESOLINE) 100 MG tablet TAKE 1 TABLET BY MOUTH THREE TIMES A DAY   isosorbide dinitrate (ISORDIL) 30 MG tablet TAKE 1 TABLET BY MOUTH THREE TIMES A DAY   Semaglutide (RYBELSUS) 7 MG TABS Take 7 mg by mouth daily before breakfast.   sulfamethoxazole-trimethoprim (BACTRIM DS) 800-160 MG tablet Take 2 tablets by mouth 2 (two) times daily.   atorvastatin (LIPITOR) 20 MG tablet Take 1 tablet (20 mg total) by mouth daily.   No facility-administered encounter medications on file as of 03/31/2022.    Allergies (verified) No known allergies   History: Past Medical History:  Diagnosis Date   Acute on chronic respiratory failure with hypoxia (HCC) 02/18/2020   [place] diastolic heart failure ([place]    Echo 10/12: EF 50-55 // b. Echo 10/17: EF 50-55, mild BAE, PASP 42 // Echo 10/18: Mild concentric LVH, EF 55-60, normal wall motion, mild MAC, severe LAE, mild TR, PASP 15, trivial pericardial effusion   CKD (chronic kidney disease) stage 4, GFR 15-29 ml/min ([place] 10/25/2016   admitted in 2/18 with SCr of 10 in the setting of dehydration from illness, etc >> improved to 5 at DC   CKD (chronic kidney disease) stage 4, GFR 15-29 ml/min ([place] 10/25/2016   DM2 (diabetes mellitus, type 2) (HCC)    A1c 7.2 in 05/2016  Gestational diabetes mellitus in pregnancy 05/24/2013   HCAP (healthcare-associated pneumonia)    HLD (hyperlipidemia)    HTN (hypertension)    HTN in pregnancy, chronic 05/24/2013   Hypertensive heart disease with CHF (congestive heart failure) ([place]) 05/25/2016   Lower extremity edema    Morbid obesity (HCC)    OSA (obstructive sleep apnea) 07/09/2011   s/p Trach 2/2 prolonged respiratory failure during admx for CHF in 05/2016   Prediabetes    Pulmonary hypertension ([place])    secondary from CHF/OSA   Sinus tachycardia    SOB (shortness of  breath)    Past Surgical History:  Procedure Laterality Date   TRACHEOSTOMY TUBE PLACEMENT N/A 06/11/2016   Procedure: TRACHEOSTOMY;  Surgeon: Melida Quitter, MD;  Location: [facility] OR;  Service: ENT;  Laterality: N/A;   Family History  Problem Relation Age of Onset   Hypertension Mother    Hyperlipidemia Mother    Diabetes Mother    Obesity Mother    Diabetes Other    Birth defects Paternal Grandfather        unknown type   Social History   Socioeconomic History   Marital status: Single    Spouse name: Not on file   Number of children: Not on file   Years of education: Not on file   Highest education level: Not on file  Occupational History   Occupation: disability    Occupation: Stay at home Mom  Tobacco Use   Smoking status: Never   Smokeless tobacco: Never  Vaping Use   Vaping Use: Never used  Substance and Sexual Activity   Alcohol use: No   Drug use: No   Sexual activity: Not Currently    Birth control/protection: I.U.D.  Other Topics Concern   Not on file  Social History Narrative   LIVES ALONE   WORKS AT [employer]   DENIES ANY TOBACCO OR ETOH USE         Social Determinants of Health   Financial Resource Strain: Low Risk  (03/31/2022)   Overall Financial Resource Strain (CARDIA)    Difficulty of Paying Living Expenses: Not hard at all  Food Insecurity: No Food Insecurity (03/31/2022)   Hunger Vital Sign    Worried About Running Out of Food in the Last Year: Never true    Ran Out of Food in the Last Year: Never true  Transportation Needs: No Transportation Needs (03/31/2022)   PRAPARE - Hydrologist (Medical): No    Lack of Transportation (Non-Medical): No  Physical Activity: Insufficiently Active (03/31/2022)   Exercise Vital Sign    Days of Exercise per Week: 3 days    Minutes of Exercise per Session: 20 min  Stress: No Stress Concern Present (03/31/2022)   [place]    Feeling of Stress : Not at all  Social Connections: Socially Isolated (03/31/2022)   Social Connection and Isolation Panel [NHANES]    Frequency of Communication with Friends and Family: Twice a week    Frequency of Social Gatherings with Friends and Family: Twice a week    Attends Religious Services: Never    Marine scientist or Organizations: No    Attends Music therapist: Never    Marital Status: Never married    Tobacco Counseling Counseling given: Not Answered   Clinical Intake:  Pre-visit preparation completed: Yes  Pain : No/denies pain     Nutritional Risks:  None Diabetes: No  How often do you need to have someone help you when you read instructions, pamphlets, or other written materials from your doctor or pharmacy?: 1 - Never What is the last grade level you completed in school?: college  Diabetic?no    Interpreter Needed?: No  Information entered by :: L.Wilson,LPN   Activities of Daily Living    03/31/2022   10:09 AM  In your present state of health, do you have any difficulty performing the following activities:  Hearing? 0  Vision? 0  Difficulty concentrating or making decisions? 0  Walking or climbing stairs? 0  Dressing or bathing? 0  Doing errands, shopping? 0  Preparing Food and eating ? N  Using the Toilet? N  In the past six months, have you accidently leaked urine? N  Do you have problems with loss of bowel control? N  Managing your Medications? N  Managing your Finances? N    Patient Care Team: Nche, Charlene Brooke, NP as PCP - General (Internal Medicine) Jerline Pain, MD as PCP - Cardiology (Cardiology)  Indicate any recent Medical Services you may have received from other than [facility] providers in the past year (date may be approximate).     Assessment:   This is a routine wellness examination for [name] [name].  Hearing/Vision screen Vision Screening - Comments:: Patient to schedule    Dietary issues and exercise activities discussed: Current Exercise Habits: Home exercise routine, Type of exercise: walking, Time (Minutes): 20, Frequency (Times/Week): 3, Weekly Exercise (Minutes/Week): 60, Intensity: Mild, Exercise limited by: None identified   Goals Addressed   None    Depression Screen    03/31/2022   10:09 AM 03/31/2022   10:08 AM 10/13/2021   10:18 AM 11/26/2020   11:38 AM 07/01/2020   11:52 AM 03/13/2020   12:50 PM 03/13/2020   11:41 AM  PHQ 2/9 Scores  PHQ - 2 Score 0 0 3 0 4 0 3  PHQ- 9 Score   _0 Fall Risk    03/31/2022   10:09 AM 11/26/2020   10:25 AM 03/13/2020   12:49 PM 03/13/2020   11:05 AM 08/21/2019   11:05 AM  [place] in the past year? 0 0 0 0 0  Number falls in past yr: 0 0 0 0   Injury with Fall? 0 0 0 0   Risk for fall due to :  No Fall Risks     Follow up Education provided Falls evaluation completed Falls prevention discussed      [place]:  Any stairs in or around the home? No  If so, are there any without handrails? No  Home free of loose throw rugs in walkways, pet beds, electrical cords, etc? Yes  Adequate lighting in your home to reduce risk of falls? Yes   ASSISTIVE DEVICES UTILIZED TO PREVENT FALLS:  Life alert? No  Use of a cane, walker or w/c? No  Grab bars in the bathroom? No  Shower chair or bench in shower? No  Elevated toilet seat or a handicapped toilet? No     Cognitive Function:    Normal cognitive status assessed by telephone conversation by this Nurse Health Advisor. No abnormalities found.      03/31/2022   10:15 AM  6CIT Screen  What Year? 0 points  What month? 0 points  What time? 0 points  Count back from 20 0 points  Months in reverse 0 points  Repeat phrase 0 points  Total Score 0 points    Immunizations Immunization History  Administered Date(s) Administered   Influenza,inj,Quad PF,6+ Mos 06/30/2016, 05/28/2020   PFIZER(Purple  Top)SARS-COV-2 Vaccination 11/28/2019, 12/19/2019   Pneumococcal Polysaccharide-23 11/26/2020    TDAP status: Due, Education has been provided regarding the importance of this vaccine. Advised may receive this vaccine at local pharmacy or Health Dept. Aware to provide a copy of the vaccination record if obtained from local pharmacy or Health Dept. Verbalized acceptance and understanding.  Flu Vaccine status: Up to date  Pneumococcal vaccine status: Up to date  Covid-19 vaccine status: Completed vaccines  Qualifies for Shingles Vaccine? No   Zostavax completed No   Shingrix Completed?: No.    Education has been provided regarding the importance of this vaccine. Patient has been advised to call insurance company to determine out of pocket expense if they have not yet received this vaccine. Advised may also receive vaccine at local pharmacy or Health Dept. Verbalized acceptance and understanding.  Screening Tests Health Maintenance  Topic Date Due   Hepatitis C Screening  Never done   TETANUS/TDAP  Never done   PAP SMEAR-Modifier  Never done   OPHTHALMOLOGY EXAM  10/28/2017   COVID-19 Vaccine (3 - Pfizer series) 02/13/2020   FOOT EXAM  11/19/2020   INFLUENZA VACCINE  03/23/2022   HEMOGLOBIN A1C  09/22/2022   HIV Screening  Completed   HPV VACCINES  Aged Out    Health Maintenance  Health Maintenance Due  Topic Date Due   Hepatitis C Screening  Never done   TETANUS/TDAP  Never done   PAP SMEAR-Modifier  Never done   OPHTHALMOLOGY EXAM  10/28/2017   COVID-19 Vaccine (3 - Pfizer series) 02/13/2020   FOOT EXAM  11/19/2020   INFLUENZA VACCINE  03/23/2022    Colorectal cancer screening: No longer required.   Mammogram status: Ordered 03/31/2022. Pt provided with contact info and advised to call to schedule appt.   Bone Density status: Ordered not of age . Pt provided with contact info and advised to call to schedule appt.  Lung Cancer Screening: (Low Dose CT Chest recommended  if Age 35-80 years, 30 pack-year currently smoking OR have quit w/in 15years.) does not qualify.   Lung Cancer Screening Referralna  Additional Screening:  Hepatitis C Screening: does not qualify;   Vision Screening: Recommended annual ophthalmology exams for early detection of glaucoma and other disorders of the eye. Is the patient up to date with their annual eye exam?  No  Who is the provider or what is the name of the office in which the patient attends annual eye exams? Patient to schedule  If pt is not established with a provider, would they like to be referred to a provider to establish care? No .   Dental Screening: Recommended annual dental exams for proper oral hygiene  Community Resource Referral / Chronic Care Management: CRR required this visit?  No   CCM required this visit?  No      Plan:     I have personally reviewed and noted the following in the patient's chart:   Medical and social history Use of alcohol, tobacco or illicit drugs  Current medications and supplements including opioid prescriptions.  Functional ability and status Nutritional status Physical activity Advanced directives List of other physicians Hospitalizations, surgeries, and ER visits in previous 12 months Vitals Screenings to include cognitive, depression, and falls Referrals and appointments  In  addition, I have reviewed and discussed with patient certain preventive protocols, quality metrics, and best practice recommendations. A written personalized care plan for preventive services as well as general preventive health recommendations were provided to patient.     Daphane Shepherd, LPN   11/30/8262   Nurse Notes: none

## 2022-04-05 ENCOUNTER — Other Ambulatory Visit: Payer: Self-pay | Admitting: Cardiology

## 2022-04-05 DIAGNOSIS — I5032 Chronic diastolic (congestive) heart failure: Secondary | ICD-10-CM

## 2022-04-05 DIAGNOSIS — I10 Essential (primary) hypertension: Secondary | ICD-10-CM

## 2022-04-06 ENCOUNTER — Other Ambulatory Visit: Payer: Self-pay | Admitting: Physician Assistant

## 2022-04-06 ENCOUNTER — Other Ambulatory Visit: Payer: Self-pay | Admitting: Cardiology

## 2022-04-06 ENCOUNTER — Other Ambulatory Visit: Payer: Self-pay

## 2022-04-06 NOTE — Telephone Encounter (Signed)
Per chart patient had recent rx for Atorvastatin 4m 6//26/23 by LTruitt Merle NP.   Per LOV 03/03/21: INCREASE: Atorvastatin to 224mdaily  Please confirm patient's current dose. Thank you!

## 2022-04-07 NOTE — Telephone Encounter (Signed)
Pt's medication was sent to pt's pharmacy as requested. Pt had labs done in February 2023. Confirmation received

## 2022-04-12 ENCOUNTER — Ambulatory Visit (INDEPENDENT_AMBULATORY_CARE_PROVIDER_SITE_OTHER): Payer: Medicare Other | Admitting: Family Medicine

## 2022-04-14 ENCOUNTER — Other Ambulatory Visit: Payer: Self-pay | Admitting: Cardiology

## 2022-04-20 ENCOUNTER — Other Ambulatory Visit (INDEPENDENT_AMBULATORY_CARE_PROVIDER_SITE_OTHER): Payer: Self-pay | Admitting: Family Medicine

## 2022-04-20 DIAGNOSIS — E1169 Type 2 diabetes mellitus with other specified complication: Secondary | ICD-10-CM

## 2022-04-21 ENCOUNTER — Other Ambulatory Visit: Payer: Self-pay | Admitting: Cardiology

## 2022-04-21 ENCOUNTER — Telehealth: Payer: Self-pay | Admitting: Cardiology

## 2022-04-21 MED ORDER — AMLODIPINE BESYLATE 5 MG PO TABS: 5.0000 mg | ORAL_TABLET | Freq: Every day | ORAL | 0 refills | Status: DC

## 2022-04-21 MED ORDER — ATORVASTATIN CALCIUM 20 MG PO TABS: 20.0000 mg | ORAL_TABLET | Freq: Every day | ORAL | 0 refills | Status: DC

## 2022-04-21 NOTE — Telephone Encounter (Signed)
*  STAT* If patient is at the pharmacy, call can be transferred to refill team.   1. Which medications need to be refilled? (please list name of each medication and dose if known)  atorvastatin (LIPITOR) 20 MG tablet amLODipine (NORVASC) 5 MG tablet  2. Which pharmacy/location (including street and city if local pharmacy) is medication to be sent to? [id number] [place] [address]  3. Do they need a 30 day or 90 day supply?   90 day supply

## 2022-04-21 NOTE — Telephone Encounter (Signed)
Pt's medications were sent to pt's pharmacy as requested. Confirmation received.

## 2022-04-28 ENCOUNTER — Telehealth: Payer: Self-pay | Admitting: Nurse Practitioner

## 2022-04-28 NOTE — Telephone Encounter (Signed)
Left message for patient to call back and schedule Medicare Annual Wellness Visit (AWV).   Please offer to do virtually or by telephone.  Left office number and my jabber [phone].  Last AWV:03/13/2020  Please schedule at anytime with Nurse Health Advisor.

## 2022-04-29 ENCOUNTER — Other Ambulatory Visit: Payer: Self-pay | Admitting: Cardiology

## 2022-04-30 NOTE — Progress Notes (Deleted)
Cardiology Office Note:    Date:  04/30/2022   ID:  [name] [name], DOB 1980-08-26, MRN [id number]  PCP:  Flossie Buffy, NP  [place] Providers Cardiologist:  Candee Furbish, MD { Click to update primary MD,subspecialty MD or APP then REFRESH:1}    Referring MD: Flossie Buffy, NP   Chief Complaint:  No chief complaint on file. {Click here for Visit Info    :1}    History of Present Illness:   [name] [name] is a 41 y.o. female with  hx of HTN, OSA, morbid obesity, & gestational diabetes.  She was admitted in 05/2016 respiratory failure in the setting of HTN emergency with pulmonary edema and OHS/OSA requiring intubation.  She ultimately underwent tracheostomy due to prolonged respiratory failure.  She did have prolonged sinus pauses during coughing and straining against the ventilator.  This was reviewed with EP and she was not felt to need a PPM as her pauses were likely related to underlying OSA/OHS. Echocardiogram did demonstrate pulmonary hypertension with PASP 42 and normal ejection fraction.  She was admitted in 08/2016 with acute renal failure in the setting of dehydration. She has been followed in our hypertension clinic to help with control of blood pressure.  I saw the patient 02/2021 and was on O2, having trouble losing weight. I increased her lipitor.        Past Medical History:  Diagnosis Date   Acute on chronic respiratory failure with hypoxia ([place]) 02/18/2020   Chronic diastolic heart failure ([place])    Echo 10/12: EF 50-55 // b. Echo 10/17: EF 50-55, mild BAE, PASP 42 // Echo 10/18: Mild concentric LVH, EF 55-60, normal wall motion, mild MAC, severe LAE, mild TR, PASP 15, trivial pericardial effusion   CKD (chronic kidney disease) stage 4, GFR 15-29 ml/min (HCC) 10/25/2016   admitted in 2/18 with SCr of 10 in the setting of dehydration from illness, etc >> improved to 5 at DC   CKD (chronic kidney disease) stage 4, GFR 15-29 ml/min (HCC)  10/25/2016   DM2 (diabetes mellitus, type 2) (HCC)    A1c 7.2 in 05/2016   Gestational diabetes mellitus in pregnancy 05/24/2013   HCAP (healthcare-associated pneumonia)    HLD (hyperlipidemia)    HTN (hypertension)    HTN in pregnancy, chronic 05/24/2013   Hypertensive heart disease with CHF (congestive heart failure) ([place]) 05/25/2016   Lower extremity edema    Morbid obesity (HCC)    OSA (obstructive sleep apnea) 07/09/2011   s/p Trach 2/2 prolonged respiratory failure during admx for CHF in 05/2016   Prediabetes    Pulmonary hypertension ([place])    secondary from CHF/OSA   Sinus tachycardia    SOB (shortness of breath)    Current Medications: No outpatient medications have been marked as taking for the 05/05/22 encounter (Appointment) with Imogene Burn, PA-C.    Allergies:   No known allergies   Social History   Tobacco Use   Smoking status: Never   Smokeless tobacco: Never  Vaping Use   Vaping Use: Never used  Substance Use Topics   Alcohol use: No   Drug use: No    Family Hx: The patient's family history includes Birth defects in her paternal grandfather; Diabetes in her mother and another family member; Hyperlipidemia in her mother; Hypertension in her mother; Obesity in her mother.  ROS   EKGs/Labs/Other Test Reviewed:    EKG:  EKG is *** ordered today.  The ekg ordered  today demonstrates ***  Recent Labs: 10/13/2021: Hemoglobin 11.5; Platelets 260; TSH 1.680 03/22/2022: ALT 16; BUN 9; Creatinine, Ser 0.87; Potassium 3.9; Sodium 144   Recent Lipid Panel Recent Labs    10/13/21 1131  CHOL 184  TRIG 96  HDL 59  LDLCALC 108*     Prior CV Studies: {Select studies to display:26339}  ECHO IMPRESSIONS 07/2019   1. Left ventricular ejection fraction, by visual estimation, is 60 to  65%. The left ventricle has normal function. There is no left ventricular  hypertrophy.   2. The left ventricle has no regional wall motion abnormalities.   3. Global right  ventricle has normal systolic function.The right  ventricular size is normal. No increase in right ventricular wall  thickness.   4. Left atrial size was mildly dilated.   5. Right atrial size was normal.   6. Trivial pericardial effusion is present.   7. The mitral valve is normal in structure. No evidence of mitral valve  regurgitation. No evidence of mitral stenosis.   8. The tricuspid valve is normal in structure. Tricuspid valve  regurgitation is not demonstrated.   9. The aortic valve is normal in structure. Aortic valve regurgitation is  not visualized. No evidence of aortic valve sclerosis or stenosis.  10. The pulmonic valve was normal in structure. Pulmonic valve  regurgitation is not visualized.  11. TR signal is inadequate for assessing pulmonary artery systolic  pressure.  12. The inferior vena cava is dilated in size with <50% respiratory  variability, suggesting right atrial pressure of 15 mmHg.      Risk Assessment/Calculations/Metrics:   {Does this patient have ATRIAL FIBRILLATION?:[id number]}     No BP recorded.  {Refresh Note OR Click here to enter BP  :1}***    Physical Exam:    VS:  There were no vitals taken for this visit.    Wt Readings from Last 3 Encounters:  03/22/22 (!) 401 lb (181.9 kg)  02/11/22 (!) 395 lb (179.2 kg)  01/20/22 (!) 406 lb 12.8 oz (184.5 kg)    Physical Exam  GEN: Well nourished, well developed, in no acute distress  HEENT: normal  Neck: no JVD, carotid bruits, or masses Cardiac:RRR; no murmurs, rubs, or gallops  Respiratory:  clear to auscultation bilaterally, normal work of breathing GI: soft, nontender, nondistended, + BS Ext: without cyanosis, clubbing, or edema, Good distal pulses bilaterally MS: no deformity or atrophy  Skin: warm and dry, no rash Neuro:  Alert and Oriented x 3, Strength and sensation are intact Psych: euthymic mood, full affect       ASSESSMENT & PLAN:   No problem-specific Assessment & Plan notes  found for this encounter.   HTN BP well controlled on amlodipine, carvedilol, hydralazine, isosorbide and Lasix   Chronic diastolic CHF well compensated.  Labs in April were normal   Morbid obesity going to weight loss center but has not had much success.  Considering bariatric surgery but has to lose weight first.   Hypercarbic respiratory failure/trach working with pulmonary to try to get off oxygen during the day.   Chronic RBBB   HLD LDL 1053/02/11.  Increase Lipitor to 20 mg once daily.  Recheck lipids in 3 months.        {Are you ordering a CV Procedure (e.g. stress test, cath, DCCV, TEE, etc)?   Press F2        :[id number]}   Dispo:  No follow-ups on file.   Medication Adjustments/Labs and Tests Ordered:  Current medicines are reviewed at length with the patient today.  Concerns regarding medicines are outlined above.  Tests Ordered: No orders of the defined types were placed in this encounter.  Medication Changes: No orders of the defined types were placed in this encounter.  Signed, Ermalinda Barrios, PA-C  04/30/2022 8:06 AM    [address], [place], [place]  [address] Phone: [phone]; Fax: [phone]

## 2022-05-03 ENCOUNTER — Other Ambulatory Visit: Payer: Self-pay | Admitting: Cardiology

## 2022-05-03 DIAGNOSIS — I10 Essential (primary) hypertension: Secondary | ICD-10-CM

## 2022-05-03 DIAGNOSIS — I5032 Chronic diastolic (congestive) heart failure: Secondary | ICD-10-CM

## 2022-05-03 NOTE — Progress Notes (Signed)
Office Visit    Patient Name: [name] [name] Date of Encounter: 05/06/2022  Primary Care Provider:  Flossie Buffy, NP Primary Cardiologist:  Candee Furbish, MD Primary Electrophysiologist: None  Chief Complaint    [name] [name] is a 41 y.o. female with PMH of hypertension, morbid obesity, gestational diabetes, OSA, HFpEF, pulmonary HTN, CKD stage IV, who presents today for follow-up of congestive heart failure.  Past Medical History    Past Medical History:  Diagnosis Date   Acute on chronic respiratory failure with hypoxia ([place]) 02/18/2020   Chronic diastolic heart failure ([place])    Echo 10/12: EF 50-55 // b. Echo 10/17: EF 50-55, mild BAE, PASP 42 // Echo 10/18: Mild concentric LVH, EF 55-60, normal wall motion, mild MAC, severe LAE, mild TR, PASP 15, trivial pericardial effusion   CKD (chronic kidney disease) stage 4, GFR 15-29 ml/min (HCC) 10/25/2016   admitted in 2/18 with SCr of 10 in the setting of dehydration from illness, etc >> improved to 5 at DC   CKD (chronic kidney disease) stage 4, GFR 15-29 ml/min (HCC) 10/25/2016   DM2 (diabetes mellitus, type 2) (HCC)    A1c 7.2 in 05/2016   Gestational diabetes mellitus in pregnancy 05/24/2013   HCAP (healthcare-associated pneumonia)    HLD (hyperlipidemia)    HTN (hypertension)    HTN in pregnancy, chronic 05/24/2013   Hypertensive heart disease with CHF (congestive heart failure) ([place]) 05/25/2016   Lower extremity edema    Morbid obesity (HCC)    OSA (obstructive sleep apnea) 07/09/2011   s/p Trach 2/2 prolonged respiratory failure during admx for CHF in 05/2016   Prediabetes    Pulmonary hypertension ([place])    secondary from CHF/OSA   Sinus tachycardia    SOB (shortness of breath)    Past Surgical History:  Procedure Laterality Date   TRACHEOSTOMY TUBE PLACEMENT N/A 06/11/2016   Procedure: TRACHEOSTOMY;  Surgeon: Melida Quitter, MD;  Location: [facility];  Service: ENT;  Laterality: N/A;     Allergies  Allergies  Allergen Reactions   No Known Allergies     History of Present Illness    [name] [name] has a PMH of is a 41 year old female with the above mention past medical history who presents today for follow-up of congestive heart failure and hypertension.  She was initially seen by Dr. Haroldine Laws in 04/2011 for malignant hypertension.  Blood pressures were noted to be 226/128 with heart rate of 109.  EKG showed LVH with no significant ST wave abnormalities.  She was started on Lotrel and 2D echo was performed 05/2011 showing mild LVH, EF 50-55%, mild LAE.  Sleep study was also performed that showed severe OSA.  She was lost to follow-up until 05/2016 when she presented to the ED with right lower quadrant pain and was found to have elevated hypertension.  BNP was 594 with elevated creatinine and CT/D-dimer negative for PE.  She was SOB and was placed on BiPAP.  She was treated with IV Lasix placed on hydralazine and labetalol.  2D echo was also completed with EF of 50-55%, mildly dilated LA/RA, mildly elevated pulmonary artery pressure of 42 mmHg with normal LV systolic pressure.  She was ultimately intubated and underwent tracheostomy due to prolonged respiratory failure.  During hospitalization she had prolonged sinus pause due to straining against ventilator.  EP was consulted and felt polyps likely related to OSA/OHS with no need for PPM.  She was seen in follow-up on  05/2017 by Richardson Dopp, PA and was volume up on examination.  She was admitted 1/18 for dehydration and AKI.  Her Lasix was adjusted and blood pressure medications titrated.  2D echo was obtained that showed normal LV function and peak PA pressure 50 mmHg with no RWMA.  She was seen in follow-up by Truitt Merle, NP on 07/2017 after not following up with Richardson Dopp, PA.  During visit patient's blood pressure was elevated at 196/119.  Her ACE was increased and she was referred to HTN clinic.  She was last seen  on blood pressures were well controlled on amlodipine, carvedilol, hydralazine.  She was well compensated with CHF and tolerating Lasix dosage.  She was considering bariatric surgery but needs additional weight loss prior.  No further changes were made to her medications at that time.  [name] [name] presents today for follow-up alone.  Since last being seen in the office patient reports she has been doing well with no cardiac complaints.  She is currently being followed by healthy weight and wellness for weight loss.  She reports that she has had ups and downs but is still fighting to get her weight under control.  Blood pressure today was elevated at 156/89 and was 154/96 on recheck.  She reports that she had just taken her blood pressure medicine and at home it has been in the 130s over 80s.  Patient denies chest pain, palpitations, dyspnea, PND, orthopnea, nausea, vomiting, dizziness, syncope, edema, weight gain, or early satiety.  Home Medications    Current Outpatient Medications  Medication Sig Dispense Refill   amLODipine (NORVASC) 5 MG tablet Take 1 tablet (5 mg total) by mouth daily. 30 tablet 0   atorvastatin (LIPITOR) 20 MG tablet Take 1 tablet (20 mg total) by mouth daily. 30 tablet 0   carvedilol (COREG) 25 MG tablet Take 1 tablet (25 mg total) by mouth 2 (two) times daily. Please call to schedule an overdue appointment with Dr. Marlou Porch for refills, [phone], thank you. 2ND attempt. 60 tablet 0   citalopram (CELEXA) 10 MG tablet TAKE 1 TABLET BY MOUTH EVERY DAY 90 tablet 3   furosemide (LASIX) 40 MG tablet Take 1 tablet (40 mg total) by mouth daily. Please keep upcoming appointment for future refills. Thank you. 30 tablet 0   hydrALAZINE (APRESOLINE) 100 MG tablet Take 1 tablet (100 mg total) by mouth 3 (three) times daily. Please call to schedule an overdue appointment with Dr. Marlou Porch for refills, [phone], thank you. 2ND attempt. 90 tablet 0   isosorbide dinitrate (ISORDIL) 30 MG  tablet Take 1 tablet (30 mg total) by mouth 3 (three) times daily. Please call to schedule an overdue appointment with Dr. Marlou Porch for refills, [phone], thank you. 2ND attempt. 90 tablet 0   Semaglutide (RYBELSUS) 7 MG TABS Take 7 mg by mouth daily before breakfast. 30 tablet 0   sulfamethoxazole-trimethoprim (BACTRIM DS) 800-160 MG tablet Take 2 tablets by mouth 2 (two) times daily. 28 tablet 0   No current facility-administered medications for this visit.     Review of Systems  Please see the history of present illness.     All other systems reviewed and are otherwise negative except as noted above.  Physical Exam    Wt Readings from Last 3 Encounters:  05/06/22 (!) 411 lb (186.4 kg)  03/22/22 (!) 401 lb (181.9 kg)  02/11/22 (!) 395 lb (179.2 kg)   VS: Vitals:   05/06/22 1036 05/06/22 1118  BP: (!) 156/89 Marland Kitchen)  154/96  Pulse: 84   SpO2: 99%   ,Body mass index is 68.39 kg/m.  Constitutional:      Appearance: Healthy appearance. Not in distress.  Neck:     Vascular: JVD normal.  Pulmonary:     Effort: Pulmonary effort is normal.     Breath sounds: No wheezing. No rales. Diminished in the bases Cardiovascular:     Normal rate. Regular rhythm. Normal S1. Normal S2.      Murmurs: There is no murmur.  Edema:    Peripheral edema absent.  Abdominal:     Palpations: Abdomen is soft non tender. There is no hepatomegaly.  Skin:    General: Skin is warm and dry.  Neurological:     General: No focal deficit present.     Mental Status: Alert and oriented to person, place and time.     Cranial Nerves: Cranial nerves are intact.  EKG/LABS/Other Studies Reviewed    ECG personally reviewed by me today -none completed today we will complete in 6 months during follow-up.   Lab Results  Component Value Date   WBC 6.6 10/13/2021   HGB 11.5 10/13/2021   HCT 35.2 10/13/2021   MCV 89 10/13/2021   PLT 260 10/13/2021   Lab Results  Component Value Date   CREATININE 0.87  03/22/2022   BUN 9 03/22/2022   NA 144 03/22/2022   K 3.9 03/22/2022   CL 101 03/22/2022   CO2 30 (H) 03/22/2022   Lab Results  Component Value Date   ALT 16 03/22/2022   AST 15 03/22/2022   ALKPHOS 122 (H) 03/22/2022   BILITOT 0.4 03/22/2022   Lab Results  Component Value Date   CHOL 184 10/13/2021   HDL 59 10/13/2021   LDLCALC 108 (H) 10/13/2021   LDLDIRECT 99.0 09/15/2016   TRIG 96 10/13/2021   CHOLHDL 4 11/26/2020    Lab Results  Component Value Date   HGBA1C 5.9 (H) 03/22/2022    Assessment & Plan    1.  Chronic diastolic CHF: -2D echo was also completed with EF of 50-55%, mildly dilated LA/RA, mildly elevated pulmonary artery pressure of 42 mmHg with normal LV systolic pressure -Current GDMT consist of carvedilol 25 mg twice daily, isosorbide dinitrate 30 mg 3 times daily, Lasix 40 mg daily  2.  Hypertension: SeeHYPERTENSION CONTROL Vitals:   05/06/22 1036 05/06/22 1118  BP: (!) 156/89 (!) 154/96    The patient's blood pressure is elevated above target today.  In order to address the patient's elevated BP: Blood pressure will be monitored at home to determine if medication changes need to be made.    -Continue amlodipine 5 mg daily and carvedilol as noted above  3.  Hypercarbic respiratory failure: -Patient has Passy-Muir valve in place today -She is hoping to get tracheostomy reversed with weight loss.  4.  Morbid obesity: -Patient was considering bariatric surgery -She is currently followed by healthy weight and wellness.  Disposition: Follow-up with Candee Furbish, MD or APP in 6 months    Medication Adjustments/Labs and Tests Ordered: Current medicines are reviewed at length with the patient today.  Concerns regarding medicines are outlined above.   Signed, Mable Fill, Marissa Nestle, NP 05/06/2022, 11:42 AM [place]

## 2022-05-05 ENCOUNTER — Ambulatory Visit: Payer: Medicare Other | Admitting: Physician Assistant

## 2022-05-06 ENCOUNTER — Encounter: Payer: Self-pay | Admitting: Nurse Practitioner

## 2022-05-06 ENCOUNTER — Ambulatory Visit: Payer: Medicare Other | Attending: Nurse Practitioner | Admitting: Nurse Practitioner

## 2022-05-06 ENCOUNTER — Other Ambulatory Visit (INDEPENDENT_AMBULATORY_CARE_PROVIDER_SITE_OTHER): Payer: Self-pay | Admitting: Family Medicine

## 2022-05-06 VITALS — BP 154/96 | HR 84 | Ht 65.0 in | Wt >= 6400 oz

## 2022-05-06 DIAGNOSIS — E1169 Type 2 diabetes mellitus with other specified complication: Secondary | ICD-10-CM

## 2022-05-06 DIAGNOSIS — J9612 Chronic respiratory failure with hypercapnia: Secondary | ICD-10-CM

## 2022-05-06 DIAGNOSIS — I5032 Chronic diastolic (congestive) heart failure: Secondary | ICD-10-CM

## 2022-05-06 DIAGNOSIS — I10 Essential (primary) hypertension: Secondary | ICD-10-CM | POA: Diagnosis present

## 2022-05-06 DIAGNOSIS — J9611 Chronic respiratory failure with hypoxia: Secondary | ICD-10-CM

## 2022-05-06 NOTE — Patient Instructions (Signed)
Medication Instructions:  Your physician recommends that you continue on your current medications as directed. Please refer to the Current Medication list given to you today.  *If you need a refill on your cardiac medications before your next appointment, please call your pharmacy*   Follow-Up: At [facility], you and your health needs are our priority.  As part of our continuing mission to provide you with exceptional heart care, we have created designated Provider Care Teams.  These Care Teams include your primary Cardiologist (physician) and Advanced Practice Providers (APPs -  Physician Assistants and Nurse Practitioners) who all work together to provide you with the care you need, when you need it.  We recommend signing up for the patient portal called "MyChart".  Sign up information is provided on this After Visit Summary.  MyChart is used to connect with patients for Virtual Visits (Telemedicine).  Patients are able to view lab/test results, encounter notes, upcoming appointments, etc.  Non-urgent messages can be sent to your provider as well.   To learn more about what you can do with MyChart, go to [email or web address].    Your next appointment:   6 month(s)  The format for your next appointment:   In Person  Provider:   Candee Furbish, MD

## 2022-05-11 NOTE — Addendum Note (Signed)
Addended by: Daphane Shepherd on: 05/11/2022 02:00 PM   Modules accepted: Orders, Level of Service

## 2022-05-12 ENCOUNTER — Other Ambulatory Visit: Payer: Self-pay | Admitting: Cardiology

## 2022-05-15 ENCOUNTER — Other Ambulatory Visit: Payer: Self-pay | Admitting: Cardiology

## 2022-05-18 ENCOUNTER — Ambulatory Visit: Payer: Medicare Other

## 2022-05-20 ENCOUNTER — Other Ambulatory Visit: Payer: Self-pay | Admitting: Cardiology

## 2022-05-28 ENCOUNTER — Other Ambulatory Visit (INDEPENDENT_AMBULATORY_CARE_PROVIDER_SITE_OTHER): Payer: Self-pay | Admitting: Family Medicine

## 2022-05-28 DIAGNOSIS — E1169 Type 2 diabetes mellitus with other specified complication: Secondary | ICD-10-CM

## 2022-05-31 ENCOUNTER — Other Ambulatory Visit: Payer: Self-pay | Admitting: Cardiology

## 2022-06-09 ENCOUNTER — Inpatient Hospital Stay (HOSPITAL_COMMUNITY): Admission: RE | Admit: 2022-06-09 | Payer: Medicare Other | Source: Ambulatory Visit

## 2022-06-11 ENCOUNTER — Other Ambulatory Visit (INDEPENDENT_AMBULATORY_CARE_PROVIDER_SITE_OTHER): Payer: Self-pay | Admitting: Family Medicine

## 2022-06-11 DIAGNOSIS — E1169 Type 2 diabetes mellitus with other specified complication: Secondary | ICD-10-CM

## 2022-06-17 ENCOUNTER — Ambulatory Visit (HOSPITAL_COMMUNITY)
Admission: RE | Admit: 2022-06-17 | Discharge: 2022-06-17 | Disposition: A | Discharge status: Home / Self Care | Payer: Medicare Other | Source: Ambulatory Visit | Attending: Acute Care | Admitting: Acute Care

## 2022-06-17 ENCOUNTER — Ambulatory Visit: Payer: Medicare Other

## 2022-06-17 DIAGNOSIS — E66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: Secondary | ICD-10-CM | POA: Diagnosis not present

## 2022-06-17 DIAGNOSIS — G4733 Obstructive sleep apnea (adult) (pediatric): Secondary | ICD-10-CM | POA: Diagnosis not present

## 2022-06-17 DIAGNOSIS — Z930 Tracheostomy status: Secondary | ICD-10-CM | POA: Diagnosis not present

## 2022-06-17 DIAGNOSIS — J9611 Chronic respiratory failure with hypoxia: Secondary | ICD-10-CM | POA: Diagnosis present

## 2022-06-17 DIAGNOSIS — Z9981 Dependence on supplemental oxygen: Secondary | ICD-10-CM

## 2022-06-17 DIAGNOSIS — I5032 Chronic diastolic (congestive) heart failure: Secondary | ICD-10-CM | POA: Diagnosis present

## 2022-06-17 DIAGNOSIS — Z6841 Body Mass Index (BMI) 40.0 and over, adult: Secondary | ICD-10-CM

## 2022-06-17 NOTE — Progress Notes (Signed)
Tracheostomy Procedure Note  [name] [name] [id number] 04-21-1981  Pre Procedure Tracheostomy Information  Trach Brand: Shiley Size:  6.0 6UN 75R Style: Uncuffed Secured by: Velcro   Procedure: Trach Cleaning and Trach Change    Post Procedure Tracheostomy Information  Trach Brand: Shiley Size:  6.0  FY10F Style: Uncuffed Secured by: Velcro   Post Procedure Evaluation:  ETCO2 positive color change from yellow to purple : Yes.   Vital signs:VSS Patients current condition: stable Complications: No apparent complications Trach site exam: clean, dry Wound care done: 4 x 4 gauze drain Patient did tolerate procedure well.   Education: none  Prescription needs: none    Additional needs: New PMV given to patient todayu

## 2022-06-17 NOTE — Progress Notes (Signed)
Reason for visit  Trach change  HPI This is a 41 year old female well known to me. 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 She had been close to decannulation but never could really tolerate CPAP. Since then she has been hospitalized earlier this year for decompensated HF and has since been on supplemental oxygen. She presents today for trach change   Review of Systems  All other systems reviewed and are negative.  Exam  41 year old ambulatory female.  HENT NCAT size 6 cuffless shiley unremarkable  Pulm clear  Card rrr Abd soft Ext warm and dry  Neuro intact   Trach change The trach was removed. The stoma was evaluated and was unremarkable. A new trach was inserted over obturator and tolerated. Well. Placement was confirmed via ETCO2 Active Problems:   Tracheostomy dependence (HCC)   Class 3 severe obesity with serious comorbidity and body mass index (BMI) of 60.0 to 69.9 in adult (HCC)   OSA (obstructive sleep apnea)   Chronic respiratory failure with hypoxia and hypercapnia (HCC)   Chronic diastolic heart failure (HCC)   Hypoventilation associated with obesity syndrome (HCC)   Dependence on supplemental oxygen    Tracheostomy dependence ([place]) Overview: Trach: # 6 cuffless shiley Last change 10/26   Discussion Stable trach. She had gotten lost to f/u but now back at the clinic. Trach stable. I doubt we are going to get back to working on decannulation any time soon.   Plan Cont routine trach care See her again in 10 weeks for trach change  Cont supplemental oxygen      My time 14 min   Erick Colace ACNP-BC [place] Pager # [phone] OR # [phone] if no answer

## 2022-07-02 ENCOUNTER — Other Ambulatory Visit: Payer: Self-pay | Admitting: Cardiology

## 2022-07-02 DIAGNOSIS — I10 Essential (primary) hypertension: Secondary | ICD-10-CM

## 2022-07-02 MED ORDER — HYDRALAZINE HCL 100 MG PO TABS: 100.0000 mg | ORAL_TABLET | Freq: Three times a day (TID) | ORAL | 3 refills | Status: DC

## 2022-08-11 ENCOUNTER — Ambulatory Visit: Payer: Medicare Other

## 2022-09-02 ENCOUNTER — Other Ambulatory Visit: Payer: Self-pay | Admitting: Cardiology

## 2022-09-02 DIAGNOSIS — I10 Essential (primary) hypertension: Secondary | ICD-10-CM

## 2022-09-02 DIAGNOSIS — I5032 Chronic diastolic (congestive) heart failure: Secondary | ICD-10-CM

## 2022-09-30 ENCOUNTER — Other Ambulatory Visit: Payer: Self-pay | Admitting: Cardiology

## 2022-09-30 DIAGNOSIS — I5032 Chronic diastolic (congestive) heart failure: Secondary | ICD-10-CM

## 2022-09-30 DIAGNOSIS — I10 Essential (primary) hypertension: Secondary | ICD-10-CM

## 2022-10-05 ENCOUNTER — Ambulatory Visit: Payer: Medicare Other

## 2022-10-10 ENCOUNTER — Other Ambulatory Visit: Payer: Self-pay | Admitting: Cardiology

## 2022-11-04 ENCOUNTER — Ambulatory Visit: Payer: Medicare Other | Admitting: Cardiology

## 2022-12-03 ENCOUNTER — Ambulatory Visit: Payer: Medicare Other | Attending: Cardiology | Admitting: Physician Assistant

## 2022-12-03 NOTE — Progress Notes (Deleted)
Office Visit    Patient Name: [name] [name] Date of Encounter: 12/03/2022  PCP:  Anne Ng, NP   [facility]  Cardiologist:  Donato Schultz, MD  Advanced Practice Provider:  No care team member to display Electrophysiologist:  None   HPI    [name] [name] is a 42 y.o. female with a past medical history of hypertension, morbid obesity, gestational diabetes, OSA, HFpEF, pulmonary hypertension, CKD stage IV presents today for follow-up appointment.  Patient was initially seen by Dr. Gala Romney 04/2011 for malignant hypertension.  Blood pressures were noted to be 226/128 with heart rate of 109.  EKG showed LVH with no significant ST wave abnormalities.  Started on Lotrel and 2D echocardiogram was performed showing mild LVH, EF 50 to 55%, mild LAE.  Sleep study was also performed which showed severe OSA.  Lost to follow-up until 05/2016 when she presented to the ED with right lower quadrant pain and was found to have elevated hypertension.  BNP was 594 with elevated creatinine and testing was negative for PE.  She was short of breath and was placed on BiPAP.  She was treated with IV Lasix and placed on hydralazine and labetalol.  2D echo was completed with EF 50 to 55%, mildly dilated LA/RA, mildly elevated pulmonary artery pressure of 42 mmHg with normal LV systolic pressure.  Ultimately, intubated and underwent tracheostomy due to prolonged respiratory failure.  During hospitalization had prolonged sinus pause due to straining against ventilator.  EP was consulted and felt polyps likely related to OSA/OHS with no need for PPM.  She was seen by Tereso Newcomer, PA 05/2017 and was volume up on examination.  Admitted 1/18 for dehydration and AKI.  Her Lasix was adjusted and blood pressure medications titrated.  2D echocardiogram showed LV function normal and peak PA pressure 50 mmHg with no RWMA.  Was followed by Norma Fredrickson, NP 07/2017 after not following up  with Tereso Newcomer.  During visit blood pressure was 196/119.  Her ACE was increased and she was referred to hypertension clinic.  She was seen there and blood pressure was well-controlled on amlodipine, carvedilol, and hydralazine.  Well compensated CHF and tolerating Lasix dosage.  Considering bariatric surgery but needed additional weight loss prior to.  No further changes were made to her medications at that time.  She was last seen by Robin Searing, NP 05/06/2022 and she had been doing well without cardiac complaints.  Reported she had ups and downs but still fighting to keep her weight under control.  Blood pressure was elevated at that visit 156/89.  Recheck was 154/96.  Reports that she had been taking her blood pressure medications and at home usually 130s over 80s.  Denies chest pain or shortness of breath.  Today, she ***  Past Medical History    Past Medical History:  Diagnosis Date   Acute on chronic respiratory failure with hypoxia (HCC) 02/18/2020   Chronic diastolic heart failure (HCC)    Echo 10/12: EF 50-55 // b. Echo 10/17: EF 50-55, mild BAE, PASP 42 // Echo 10/18: Mild concentric LVH, EF 55-60, normal wall motion, mild MAC, severe LAE, mild TR, PASP 15, trivial pericardial effusion   CKD (chronic kidney disease) stage 4, GFR 15-29 ml/min (HCC) 10/25/2016   admitted in 2/18 with SCr of 10 in the setting of dehydration from illness, etc >> improved to 5 at DC   CKD (chronic kidney disease) stage 4, GFR 15-29 ml/min (HCC)  10/25/2016   DM2 (diabetes mellitus, type 2) (HCC)    A1c 7.2 in 05/2016   Gestational diabetes mellitus in pregnancy 05/24/2013   HCAP (healthcare-associated pneumonia)    HLD (hyperlipidemia)    HTN (hypertension)    HTN in pregnancy, chronic 05/24/2013   Hypertensive heart disease with CHF (congestive heart failure) (HCC) 05/25/2016   Lower extremity edema    Morbid obesity (HCC)    OSA (obstructive sleep apnea) 07/09/2011   s/p Trach 2/2 prolonged respiratory  failure during admx for CHF in 05/2016   Prediabetes    Pulmonary hypertension (HCC)    secondary from CHF/OSA   Sinus tachycardia    SOB (shortness of breath)    Past Surgical History:  Procedure Laterality Date   TRACHEOSTOMY TUBE PLACEMENT N/A 06/11/2016   Procedure: TRACHEOSTOMY;  Surgeon: Christia Reading, MD;  Location: [facility];  Service: ENT;  Laterality: N/A;    Allergies  Allergies  Allergen Reactions   No Known Allergies     EKGs/Labs/Other Studies Reviewed:   The following studies were reviewed today:  Echocardiogram 08/07/2019 IMPRESSIONS     1. Left ventricular ejection fraction, by visual estimation, is 60 to  65%. The left ventricle has normal function. There is no left ventricular  hypertrophy.   2. The left ventricle has no regional wall motion abnormalities.   3. Global right ventricle has normal systolic function.The right  ventricular size is normal. No increase in right ventricular wall  thickness.   4. Left atrial size was mildly dilated.   5. Right atrial size was normal.   6. Trivial pericardial effusion is present.   7. The mitral valve is normal in structure. No evidence of mitral valve  regurgitation. No evidence of mitral stenosis.   8. The tricuspid valve is normal in structure. Tricuspid valve  regurgitation is not demonstrated.   9. The aortic valve is normal in structure. Aortic valve regurgitation is  not visualized. No evidence of aortic valve sclerosis or stenosis.  10. The pulmonic valve was normal in structure. Pulmonic valve  regurgitation is not visualized.  11. TR signal is inadequate for assessing pulmonary artery systolic  pressure.  12. The inferior vena cava is dilated in size with <50% respiratory  variability, suggesting right atrial pressure of 15 mmHg.   FINDINGS   Left Ventricle: Left ventricular ejection fraction, by visual estimation,  is 60 to 65%. The left ventricle has normal function. The left ventricle  has no  regional wall motion abnormalities. The left ventricular internal  cavity size was the left  ventricle is normal in size. There is no left ventricular hypertrophy.  Left ventricular diastolic parameters were normal. Indeterminate filling  pressures.   Right Ventricle: The right ventricular size is normal. No increase in  right ventricular wall thickness. Global RV systolic function is has  normal systolic function.   Left Atrium: Left atrial size was mildly dilated.   Right Atrium: Right atrial size was normal in size   Pericardium: Trivial pericardial effusion is present.   Mitral Valve: The mitral valve is normal in structure. No evidence of  mitral valve regurgitation. No evidence of mitral valve stenosis by  observation.   Tricuspid Valve: The tricuspid valve is normal in structure. Tricuspid  valve regurgitation is not demonstrated.   Aortic Valve: The aortic valve is normal in structure. Aortic valve  regurgitation is not visualized. The aortic valve is structurally normal,  with no evidence of sclerosis or stenosis.   Pulmonic  Valve: The pulmonic valve was normal in structure. Pulmonic valve  regurgitation is not visualized. Pulmonic regurgitation is not visualized.   Aorta: The aortic root, ascending aorta and aortic arch are all  structurally normal, with no evidence of dilitation or obstruction.   Venous: The inferior vena cava is dilated in size with less than 50%  respiratory variability, suggesting right atrial pressure of 15 mmHg.   IAS/Shunts: No atrial level shunt detected by color flow Doppler. There is  no evidence of a patent foramen ovale. No ventricular septal defect is  seen or detected. There is no evidence of an atrial septal defect.   EKG:  EKG is *** ordered today.  The ekg ordered today demonstrates ***  Recent Labs: 03/22/2022: ALT 16; BUN 9; Creatinine, Ser 0.87; Potassium 3.9; Sodium 144  Recent Lipid Panel    Component Value Date/Time    CHOL 184 10/13/2021 1131   TRIG 96 10/13/2021 1131   HDL 59 10/13/2021 1131   CHOLHDL 4 11/26/2020 1130   VLDL 22.0 11/26/2020 1130   LDLCALC 108 (H) 10/13/2021 1131   LDLDIRECT 99.0 09/15/2016 0855    Risk Assessment/Calculations:  {Does this patient have ATRIAL FIBRILLATION?:[phone]}  Home Medications   No outpatient medications have been marked as taking for the 12/03/22 encounter (Appointment) with Sharlene Dory, PA-C.     Review of Systems   ***   All other systems reviewed and are otherwise negative except as noted above.  Physical Exam    VS:  There were no vitals taken for this visit. , BMI There is no height or weight on file to calculate BMI.  Wt Readings from Last 3 Encounters:  05/06/22 (!) 411 lb (186.4 kg)  03/22/22 (!) 401 lb (181.9 kg)  02/11/22 (!) 395 lb (179.2 kg)     GEN: Well nourished, well developed, in no acute distress. HEENT: normal. Neck: Supple, no JVD, carotid bruits, or masses. Cardiac: ***RRR, no murmurs, rubs, or gallops. No clubbing, cyanosis, edema.  ***Radials/PT 2+ and equal bilaterally.  Respiratory:  ***Respirations regular and unlabored, clear to auscultation bilaterally. GI: Soft, nontender, nondistended. MS: No deformity or atrophy. Skin: Warm and dry, no rash. Neuro:  Strength and sensation are intact. Psych: Normal affect.  Assessment & Plan    Chronic diastolic CHF Hypertension Hyperlipidemia Hypercarbic respiratory failure Morbid obesity  No BP recorded.  {Refresh Note OR Click here to enter BP  :1}***      Disposition: Follow up {follow up:15908} with Donato Schultz, MD or APP.  Signed, Sharlene Dory, PA-C 12/03/2022, 11:33 AM [facility]

## 2023-01-04 ENCOUNTER — Ambulatory Visit: Payer: Medicare Other | Admitting: Nurse Practitioner

## 2023-01-04 ENCOUNTER — Ambulatory Visit: Payer: Medicare Other | Admitting: Physician Assistant

## 2023-01-05 ENCOUNTER — Telehealth: Payer: Self-pay | Admitting: Nurse Practitioner

## 2023-01-05 NOTE — Telephone Encounter (Signed)
Left message to return call.  If she does not call back this afternoon we will address this issue at her appointment

## 2023-01-05 NOTE — Telephone Encounter (Signed)
See note

## 2023-01-06 ENCOUNTER — Encounter: Payer: Self-pay | Admitting: Nurse Practitioner

## 2023-01-06 ENCOUNTER — Ambulatory Visit (INDEPENDENT_AMBULATORY_CARE_PROVIDER_SITE_OTHER): Payer: Medicare Other | Admitting: Nurse Practitioner

## 2023-01-06 ENCOUNTER — Ambulatory Visit (HOSPITAL_COMMUNITY)
Admission: RE | Admit: 2023-01-06 | Discharge: 2023-01-06 | Disposition: A | Discharge status: Home / Self Care | Payer: Medicare Other | Source: Ambulatory Visit | Attending: Nurse Practitioner | Admitting: Nurse Practitioner

## 2023-01-06 VITALS — BP 140/90 | HR 81 | Temp 98.0°F | Resp 16 | Ht 65.0 in | Wt >= 6400 oz

## 2023-01-06 DIAGNOSIS — E785 Hyperlipidemia, unspecified: Secondary | ICD-10-CM | POA: Diagnosis not present

## 2023-01-06 DIAGNOSIS — G4733 Obstructive sleep apnea (adult) (pediatric): Secondary | ICD-10-CM | POA: Diagnosis not present

## 2023-01-06 DIAGNOSIS — Z930 Tracheostomy status: Secondary | ICD-10-CM

## 2023-01-06 DIAGNOSIS — I5033 Acute on chronic diastolic (congestive) heart failure: Secondary | ICD-10-CM

## 2023-01-06 DIAGNOSIS — Z7984 Long term (current) use of oral hypoglycemic drugs: Secondary | ICD-10-CM | POA: Diagnosis not present

## 2023-01-06 DIAGNOSIS — I872 Venous insufficiency (chronic) (peripheral): Secondary | ICD-10-CM | POA: Diagnosis not present

## 2023-01-06 DIAGNOSIS — J9611 Chronic respiratory failure with hypoxia: Secondary | ICD-10-CM | POA: Diagnosis not present

## 2023-01-06 DIAGNOSIS — E1169 Type 2 diabetes mellitus with other specified complication: Secondary | ICD-10-CM

## 2023-01-06 DIAGNOSIS — Z6841 Body Mass Index (BMI) 40.0 and over, adult: Secondary | ICD-10-CM | POA: Insufficient documentation

## 2023-01-06 DIAGNOSIS — J9612 Chronic respiratory failure with hypercapnia: Secondary | ICD-10-CM | POA: Insufficient documentation

## 2023-01-06 DIAGNOSIS — I5032 Chronic diastolic (congestive) heart failure: Secondary | ICD-10-CM | POA: Diagnosis present

## 2023-01-06 DIAGNOSIS — E46 Unspecified protein-calorie malnutrition: Secondary | ICD-10-CM

## 2023-01-06 DIAGNOSIS — E8809 Other disorders of plasma-protein metabolism, not elsewhere classified: Secondary | ICD-10-CM

## 2023-01-06 LAB — CBC
HCT: 35.4 % — ABNORMAL LOW (ref 36.0–46.0)
Hemoglobin: 11.1 g/dL — ABNORMAL LOW (ref 12.0–15.0)
MCHC: 31.2 g/dL (ref 30.0–36.0)
MCV: 90.4 fl (ref 78.0–100.0)
Platelets: 252 10*3/uL (ref 150.0–400.0)
RBC: 3.91 Mil/uL (ref 3.87–5.11)
RDW: 17.6 % — ABNORMAL HIGH (ref 11.5–15.5)
WBC: 4.3 10*3/uL (ref 4.0–10.5)

## 2023-01-06 LAB — HEMOGLOBIN A1C: Hgb A1c MFr Bld: 6.9 % — ABNORMAL HIGH (ref 4.6–6.5)

## 2023-01-06 LAB — COMPREHENSIVE METABOLIC PANEL
ALT: 16 U/L (ref 0–35)
AST: 14 U/L (ref 0–37)
Albumin: 3.5 g/dL (ref 3.5–5.2)
Alkaline Phosphatase: 91 U/L (ref 39–117)
BUN: 12 mg/dL (ref 6–23)
CO2: 39 mEq/L — ABNORMAL HIGH (ref 19–32)
Calcium: 9 mg/dL (ref 8.4–10.5)
Chloride: 96 mEq/L (ref 96–112)
Creatinine, Ser: 0.87 mg/dL (ref 0.40–1.20)
GFR: 81.58 mL/min (ref >60.00)
Glucose, Bld: 131 mg/dL — ABNORMAL HIGH (ref 70–99)
Potassium: 4.1 mEq/L (ref 3.5–5.1)
Sodium: 143 mEq/L (ref 135–145)
Total Bilirubin: 0.5 mg/dL (ref 0.2–1.2)
Total Protein: 7.4 g/dL (ref 6.0–8.3)

## 2023-01-06 LAB — LDL CHOLESTEROL, DIRECT: Direct LDL: 56 mg/dL

## 2023-01-06 LAB — BRAIN NATRIURETIC PEPTIDE: Pro B Natriuretic peptide (BNP): 372 pg/mL — ABNORMAL HIGH (ref 0.0–100.0)

## 2023-01-06 MED ORDER — CEPHALEXIN 500 MG PO CAPS: 500.0000 mg | ORAL_CAPSULE | Freq: Three times a day (TID) | ORAL | 0 refills | Status: DC

## 2023-01-06 NOTE — Assessment & Plan Note (Addendum)
Repeat CMP today: normal albumin and total protein

## 2023-01-06 NOTE — Assessment & Plan Note (Addendum)
No longer taking rybelsus. No reason provided  Repeat hgbA1c and CMP today: stable renal function, hgbA1c at 6.9. Resume rybelsus 3mg  F/up in 3months

## 2023-01-06 NOTE — Progress Notes (Signed)
Tracheostomy Procedure Note  [name] [name] [id number] 1980/10/23  Pre Procedure Tracheostomy Information  Trach Brand: Shiley Size:  6.0 [id number] Style: Uncuffed Secured by: Velcro   Procedure: Trach Change and trach cleaning    Post Procedure Tracheostomy Information  Trach Brand: Shiley Size:  6.0 [id number] Style: Uncuffed Secured by: Velcro   Post Procedure Evaluation:  ETCO2 positive color change from yellow to purple : Yes.   Vital signs:VSS Patients current condition: stable Complications: No apparent complications Trach site exam: clean, dry Wound care done: 4 x 4   drain gauze applied Patient did tolerate procedure well.   Education: none  Prescription needs: none    Additional needs:  New PMV given to patient at this visit

## 2023-01-06 NOTE — Progress Notes (Signed)
Reason for visit  Trach change  HPI This is a 42 year old female well known to me. 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 She had been close to decannulation but never could really tolerate CPAP. Since then she has been hospitalized earlier this year for decompensated HF and has since been on supplemental oxygen. She presents today for trach change   Review of Systems  Constitutional:  Positive for malaise/fatigue. Negative for fever.       Significant weight gain, mild increased exertional dyspnea  HENT:  Negative for congestion, nosebleeds and sore throat.   Respiratory:  Positive for shortness of breath.        Some mild increased exertional dyspnea no chest pain  Cardiovascular:  Positive for leg swelling. Negative for chest pain.  Gastrointestinal: Negative.   Genitourinary: Negative.   Musculoskeletal: Negative.   Skin:        Left anterior shin with open wound reports persistent discomfort  Neurological: Negative.   Endo/Heme/Allergies: Negative.   All other systems reviewed and are negative.  Exam   General pleasant 42 year old female patient well-known to me presents to tracheostomy clinic for planned tracheostomy change currently in no acute distress but does have more exertional dyspnea than baseline HEENT normocephalic atraumatic #6 cuffless tracheostomy in place currently on 4 L via nasal cannula Pulmonary clear to auscultation Cardiac regular rate and rhythm Abdomen soft nontender Extremities warm dry with chronic venous stasis changes and large anterior lower leg ulceration which is open and weeping Neuro awake oriented no focal deficits  Trach change The present tracheostomy was removed the tracheostomy site was inspected and was found to be unremarkable.  Following this a snus size 6 tracheostomy was placed over obturator.  Patient tolerated well placement verified via end-tidal CO2   Active Problems:   Tracheostomy  status (HCC)   Class 3 severe obesity with serious comorbidity and body mass index (BMI) of 60.0 to 69.9 in adult [facility])   Chronic respiratory failure with hypoxia and hypercapnia (HCC)   Chronic diastolic heart failure (HCC)  Tracheostomy dependence (HCC) Overview: Trach: # 6 cuffless shiley Last change 10/26   Discussion From tracheostomy standpoint she is stable.  Not currently candidate for decannulation given she has been intolerant of CPAP, and is seeing a decline in her overall health specifically her weight and exertional dyspnea  Plan Cont routine trach care Continue supplemental oxygen Return in 12 weeks for trach change    Chronic diastolic heart failure (HCC) Overview: Janellie has had increased weight gain and lower extremity edema overall exertional dyspnea is a little worse as well.  Plan Her primary provider is setting her up with cardiology follow-up.  I suspect she is going to need her diuretic adjusted      My x 22 minutes  Simonne Martinet ACNP-BC [facility] Pulmonary/Critical Care Pager # [phone] OR # [phone] if no answer

## 2023-01-06 NOTE — Progress Notes (Signed)
Established Patient Visit  Patient: [name] [name]   DOB: 04/26/1981   42 y.o. Female  MRN: [id number] Visit Date: 01/09/2023  Subjective:    Chief Complaint  Patient presents with   Cellulitis    Lower left leg, redness pitting and weeping noticed last week    HPI DM (diabetes mellitus) (HCC) No longer taking rybelsus. No reason provided  Repeat hgbA1c and CMP today: stable renal function, hgbA1c at 6.9. Resume rybelsus 3mg  F/up in 3months  Hyperlipidemia associated with type 2 diabetes mellitus (HCC) Repeat LDL today  Venous stasis dermatitis of left lower extremity Acute on left LE venous ulcer with erythema secondary to fluid overload Associated with chronic hyperpigmented and skin thickening Start keflex 500mg  TID entered referral to wound clinic (need for unna boot?)  Hypoalbuminemia due to protein-calorie malnutrition (HCC) Repeat CMP today: normal albumin and total protein  Chronic diastolic heart failure (HCC) Stable renal function and elevated BNP. Advised to f/up with cardiology ASAP Increased furosemide 40mg  BID x 3days, then 40mg  daily  Wt Readings from Last 3 Encounters:  01/06/23 (!) 441 lb (200 kg)  05/06/22 (!) 411 lb (186.4 kg)  03/22/22 (!) 401 lb (181.9 kg)    BP Readings from Last 3 Encounters:  01/06/23 (!) 140/90  05/06/22 (!) 154/96  03/22/22 (!) 145/87    Reviewed medical, surgical, and social history today  Medications: Outpatient Medications Prior to Visit  Medication Sig   amLODipine (NORVASC) 5 MG tablet TAKE 1 TABLET (5 MG TOTAL) BY MOUTH DAILY.   atorvastatin (LIPITOR) 20 MG tablet TAKE 1 TABLET BY MOUTH EVERY DAY   carvedilol (COREG) 25 MG tablet Take 1 tablet (25 mg total) by mouth 2 (two) times daily.   citalopram (CELEXA) 10 MG tablet TAKE 1 TABLET BY MOUTH EVERY DAY   furosemide (LASIX) 40 MG tablet Take 1 tablet (40 mg total) by mouth daily.   hydrALAZINE (APRESOLINE) 100 MG tablet Take 1 tablet (100 mg  total) by mouth 3 (three) times daily.   isosorbide dinitrate (ISORDIL) 30 MG tablet TAKE 1 TABLET BY MOUTH 3 (THREE) TIMES DAILY.   [DISCONTINUED] Semaglutide (RYBELSUS) 7 MG TABS Take 7 mg by mouth daily before breakfast.   [DISCONTINUED] sulfamethoxazole-trimethoprim (BACTRIM DS) 800-160 MG tablet Take 2 tablets by mouth 2 (two) times daily. (Patient not taking: Reported on 01/06/2023)   No facility-administered medications prior to visit.   Reviewed past medical and social history.   ROS per HPI above      Objective:  BP (!) 140/90 (BP Location: Left Arm, Patient Position: Sitting, Cuff Size: Large)   Pulse 81   Temp 98 F (36.7 C)   Resp 16   Ht 5\' 5"  (1.651 m)   Wt (!) 441 lb (200 kg)   SpO2 91% Comment: With o2  BMI 73.39 kg/m      Physical Exam Vitals reviewed.  Constitutional:      General: She is not in acute distress. Cardiovascular:     Rate and Rhythm: Normal rate and regular rhythm.     Pulses: Normal pulses.     Heart sounds: Normal heart sounds.  Pulmonary:     Effort: Pulmonary effort is normal.     Comments: Diminished lung sounds in bilateral bases Musculoskeletal:     Right lower leg: Edema present.     Left lower leg: Edema present.  Skin:    Findings: Erythema present.  Comments: Erythematous venous ulcer with surrounding blisters on left anterior shin.  Neurological:     Mental Status: She is oriented to person, place, and time.     Results for orders placed or performed in visit on 01/06/23  Hemoglobin A1c  Result Value Ref Range   Hgb A1c MFr Bld 6.9 (H) 4.6 - 6.5 %  B Nat Peptide  Result Value Ref Range   Pro B Natriuretic peptide (BNP) 372.0 (H) 0.0 - 100.0 pg/mL  Comprehensive metabolic panel  Result Value Ref Range   Sodium 143 135 - 145 mEq/L   Potassium 4.1 3.5 - 5.1 mEq/L   Chloride 96 96 - 112 mEq/L   CO2 39 (H) 19 - 32 mEq/L   Glucose, Bld 131 (H) 70 - 99 mg/dL   BUN 12 6 - 23 mg/dL   Creatinine, Ser 1.61 0.40 - 1.20  mg/dL   Total Bilirubin 0.5 0.2 - 1.2 mg/dL   Alkaline Phosphatase 91 39 - 117 U/L   AST 14 0 - 37 U/L   ALT 16 0 - 35 U/L   Total Protein 7.4 6.0 - 8.3 g/dL   Albumin 3.5 3.5 - 5.2 g/dL   GFR 09.60 >45.40 mL/min   Calcium 9.0 8.4 - 10.5 mg/dL  CBC  Result Value Ref Range   WBC 4.3 4.0 - 10.5 K/uL   RBC 3.91 3.87 - 5.11 Mil/uL   Platelets 252.0 150.0 - 400.0 K/uL   Hemoglobin 11.1 (L) 12.0 - 15.0 g/dL   HCT 98.1 (L) 19.1 - 47.8 %   MCV 90.4 78.0 - 100.0 fl   MCHC 31.2 30.0 - 36.0 g/dL   RDW 29.5 (H) 62.1 - 30.8 %  Direct LDL  Result Value Ref Range   Direct LDL 56.0 mg/dL      Assessment & Plan:    Problem List Items Addressed This Visit       Cardiovascular and Mediastinum   Chronic diastolic heart failure (HCC) (Chronic)    Stable renal function and elevated BNP. Advised to f/up with cardiology ASAP Increased furosemide 40mg  BID x 3days, then 40mg  daily      Venous stasis dermatitis of left lower extremity    Acute on left LE venous ulcer with erythema secondary to fluid overload Associated with chronic hyperpigmented and skin thickening Start keflex 500mg  TID entered referral to wound clinic (need for unna boot?)      Relevant Medications   cephALEXin (KEFLEX) 500 MG capsule   Other Relevant Orders   CBC (Completed)   Ambulatory referral to Wound Clinic     Endocrine   DM (diabetes mellitus) (HCC) - Primary    No longer taking rybelsus. No reason provided  Repeat hgbA1c and CMP today: stable renal function, hgbA1c at 6.9. Resume rybelsus 3mg  F/up in 3months      Relevant Medications   Semaglutide (RYBELSUS) 3 MG TABS   Other Relevant Orders   Hemoglobin A1c (Completed)   Comprehensive metabolic panel (Completed)   Hyperlipidemia associated with type 2 diabetes mellitus (HCC)    Repeat LDL today      Relevant Medications   Semaglutide (RYBELSUS) 3 MG TABS   Other Relevant Orders   Direct LDL (Completed)     Other   Hypoalbuminemia due to  protein-calorie malnutrition (HCC)    Repeat CMP today: normal albumin and total protein      Other Visit Diagnoses     Acute on chronic diastolic CHF (congestive heart failure) (HCC)  Relevant Orders   B Nat Peptide (Completed)   CBC (Completed)   DG Chest 2 View      Return in about 4 weeks (around 02/03/2023) for HTN, DM, hyperlipidemia (fasting).     Alysia Penna, NP

## 2023-01-06 NOTE — Assessment & Plan Note (Signed)
-   Repeat LDL today. 

## 2023-01-06 NOTE — Assessment & Plan Note (Addendum)
Acute on left LE venous ulcer with erythema secondary to fluid overload Associated with chronic hyperpigmented and skin thickening Start keflex 500mg  TID entered referral to wound clinic (need for unna boot?)

## 2023-01-06 NOTE — Patient Instructions (Signed)
Maintain current meds  Start keflex You will be contacted to schedule appt with wound clinic. Cover wound with non stick guaze. Schedule appt with CHF clinic as soon as possible. Go to [address] for CXR Go to lab in this office

## 2023-01-09 MED ORDER — RYBELSUS 3 MG PO TABS
3.0000 mg | ORAL_TABLET | Freq: Every day | ORAL | 1 refills | Status: DC
Start: 2023-01-09 — End: 2023-07-11

## 2023-01-09 NOTE — Assessment & Plan Note (Signed)
Stable renal function and elevated BNP. Advised to f/up with cardiology ASAP Increased furosemide 40mg  BID x 3days, then 40mg  daily

## 2023-01-12 ENCOUNTER — Other Ambulatory Visit: Payer: Self-pay | Admitting: Nurse Practitioner

## 2023-01-12 DIAGNOSIS — F3342 Major depressive disorder, recurrent, in full remission: Secondary | ICD-10-CM

## 2023-01-25 ENCOUNTER — Encounter (HOSPITAL_BASED_OUTPATIENT_CLINIC_OR_DEPARTMENT_OTHER): Payer: Medicare Other | Attending: General Surgery | Admitting: General Surgery

## 2023-01-25 DIAGNOSIS — I890 Lymphedema, not elsewhere classified: Secondary | ICD-10-CM | POA: Diagnosis not present

## 2023-01-25 DIAGNOSIS — I110 Hypertensive heart disease with heart failure: Secondary | ICD-10-CM | POA: Diagnosis not present

## 2023-01-25 DIAGNOSIS — Z6841 Body Mass Index (BMI) 40.0 and over, adult: Secondary | ICD-10-CM | POA: Diagnosis not present

## 2023-01-25 DIAGNOSIS — E66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: Secondary | ICD-10-CM | POA: Diagnosis not present

## 2023-01-25 DIAGNOSIS — Z930 Tracheostomy status: Secondary | ICD-10-CM | POA: Diagnosis not present

## 2023-01-25 DIAGNOSIS — E785 Hyperlipidemia, unspecified: Secondary | ICD-10-CM | POA: Insufficient documentation

## 2023-01-25 DIAGNOSIS — I5032 Chronic diastolic (congestive) heart failure: Secondary | ICD-10-CM | POA: Diagnosis not present

## 2023-01-25 DIAGNOSIS — Z9981 Dependence on supplemental oxygen: Secondary | ICD-10-CM | POA: Insufficient documentation

## 2023-01-25 DIAGNOSIS — I872 Venous insufficiency (chronic) (peripheral): Secondary | ICD-10-CM | POA: Diagnosis present

## 2023-01-25 DIAGNOSIS — L97822 Non-pressure chronic ulcer of other part of left lower leg with fat layer exposed: Secondary | ICD-10-CM | POA: Diagnosis not present

## 2023-01-25 DIAGNOSIS — E11622 Type 2 diabetes mellitus with other skin ulcer: Secondary | ICD-10-CM | POA: Insufficient documentation

## 2023-01-26 NOTE — Progress Notes (Signed)
HOLLYNN, OKAFOR ([id number]) [id number] Page 1 of 4 Visit Report for 01/25/2023 Abuse Risk Screen Details Patient Name: Date of Service: [name] [name], [name] [name]. 01/25/2023 9:00 A M Medical Record Number: [id number] Patient Account Number: [id number] Date of Birth/Sex: Treating RN: [birth year] (42 y.o. Orville Govern Primary Care Jessy Cybulski: Alysia Penna Other Clinician: Referring Alysen Smylie: Treating Judea Riches/Extender: Sherlynn Stalls in Treatment: 0 Abuse Risk Screen Items Answer ABUSE RISK SCREEN: Has anyone close to you tried to hurt or harm you recentlyo No Do you feel uncomfortable with anyone in your familyo No Has anyone forced you do things that you didnt want to doo No Electronic Signature([name]) Signed: 01/25/2023 4:48:42 PM By: Redmond Pulling RN, BSN Entered By: Redmond Pulling on 01/25/2023 09:39:57 -------------------------------------------------------------------------------- Activities of Daily Living Details Patient Name: Date of Service: [name] [name], [name] [name]. 01/25/2023 9:00 A M Medical Record Number: [id number] Patient Account Number: [id number] Date of Birth/Sex: Treating RN: Jul 20, 1981 (42 y.o. Orville Govern Primary Care Waylin Dorko: Alysia Penna Other Clinician: Referring Zerek Litsey: Treating Khelani Kops/Extender: Sherlynn Stalls in Treatment: 0 Activities of Daily Living Items Answer Activities of Daily Living (Please select one for each item) Drive Automobile Completely Able T Medications ake Completely Able Use T elephone Completely Able Care for Appearance Completely Able Use T oilet Completely Able Bath / Shower Completely Able Dress Self Completely Able Feed Self Completely Able Walk Completely Able Get In / Out Bed Completely Able Housework Completely Able Prepare Meals Completely Able Handle Money Completely Able Shop for Self Completely Able Electronic Signature([name]) Signed:  01/25/2023 4:48:42 PM By: Redmond Pulling RN, BSN Entered By: Redmond Pulling on 01/25/2023 09:40:20 -------------------------------------------------------------------------------- Education Screening Details Patient Name: Date of Service: [name] [name], [name] [name]. 01/25/2023 9:00 A M Medical Record Number: [id number] Patient Account Number: [id number] Date of Birth/Sex: Treating RN: [birth year] (42 y.o. Orville Govern Primary Care Makana Rostad: Alysia Penna Other Clinician: Referring Nathifa Ritthaler: Treating Emberlyn Burlison/Extender: Sherlynn Stalls in Treatment: 0 Martinovich, [name] ([id number]) [id number] Page 2 of 4 Learning Preferences/Education Level/Primary Language Learning Preference: Explanation, Demonstration, Printed Material Highest Education Level: High School Preferred Language: Economist Language Barrier: No Translator Needed: No Memory Deficit: No Emotional Barrier: No Cultural/Religious Beliefs Affecting Medical Care: No Physical Barrier Impaired Vision: No Impaired Hearing: No Decreased Hand dexterity: No Knowledge/Comprehension Knowledge Level: High Comprehension Level: High Ability to understand written instructions: High Ability to understand verbal instructions: High Motivation Anxiety Level: Calm Cooperation: Cooperative Education Importance: Acknowledges Need Interest in Health Problems: Asks Questions Perception: Coherent Willingness to Engage in Self-Management High Activities: Readiness to Engage in Self-Management High Activities: Electronic Signature([name]) Signed: 01/25/2023 4:48:42 PM By: Redmond Pulling RN, BSN Entered By: Redmond Pulling on 01/25/2023 09:40:50 -------------------------------------------------------------------------------- Fall Risk Assessment Details Patient Name: Date of Service: [name] [name], [name] [name]. 01/25/2023 9:00 A M Medical Record Number: [id number] Patient Account Number:  [id number] Date of Birth/Sex: Treating RN: [birth year] (42 y.o. Orville Govern Primary Care Rhyleigh Grassel: Alysia Penna Other Clinician: Referring Mikenzi Raysor: Treating Ardyn Forge/Extender: Sherlynn Stalls in Treatment: 0 Fall Risk Assessment Items Have you had 2 or more falls in the last 12 monthso 0 No Have you had any fall that resulted in injury in the last 12 monthso 0 No FALLS RISK SCREEN History of falling - immediate or within 3 months 0 No Secondary diagnosis (Do you have 2 or more medical diagnoseso) 15 Yes Ambulatory aid None/bed rest/wheelchair/nurse 0  Yes Crutches/cane/walker 0 No Furniture 0 No Intravenous therapy Access/Saline/Heparin Lock 0 No Gait/Transferring Normal/ bed rest/ wheelchair 0 Yes Weak (short steps with or without shuffle, stooped but able to lift head while walking, may seek 0 No support from furniture) Impaired (short steps with shuffle, may have difficulty arising from chair, head down, impaired 0 No balance) Mental Status Oriented to own ability 0 No Overestimates or forgets limitations 0 No Risk Level: Low Risk Score: 15 [name] [name], [name] [name] ([id number]) [id number] Page 3 of 4 Electronic Signature([name]) -------------------------------------------------------------------------------- Foot Assessment Details Patient Name: Date of Service: [name] [name], [name] [place] [name]. 01/25/2023 9:00 A M Medical Record Number: [id number] Patient Account Number: [id number] Date of Birth/Sex: Treating RN: [birth year] (42 y.o. Orville Govern Primary Care Mallie Giambra: Alysia Penna Other Clinician: Referring Mieczyslaw Stamas: Treating Damek Ende/Extender: Sherlynn Stalls in Treatment: 0 Foot Assessment Items Site Locations + = Sensation present, - = Sensation absent, C = Callus, U = Ulcer R = Redness, W = Warmth, M = Maceration, PU = Pre-ulcerative lesion F = Fissure, [name] = Swelling, D =  Dryness Assessment Right: Left: Other Deformity: No No Prior Foot Ulcer: No No Prior Amputation: No No Charcot Joint: No No Ambulatory Status: Ambulatory Without Help Gait: Steady Electronic Signature([name]) Signed: 01/25/2023 4:48:42 PM By: Redmond Pulling RN, BSN Entered By: Redmond Pulling on 01/25/2023 09:42:59 -------------------------------------------------------------------------------- Nutrition Risk Screening Details Patient Name: Date of Service: [name] [name], [name] [name]. 01/25/2023 9:00 A M Medical Record Number: [id number] Patient Account Number: [id number] Date of Birth/Sex: Treating RN: 09/20/1980 (42 y.o. Orville Govern Primary Care Constantino Starace: Alysia Penna Other Clinician: Referring Yarelli Decelles: Treating Marnesha Gagen/Extender: Sherlynn Stalls in Treatment: 0 Height (in): 65 Weight (lbs): 430 Body Mass Index (BMI): 71.5 [name] [name], [name] [name] ([id number]) [id number] Page 4 of 4 Nutrition Risk Screening Items Score Screening NUTRITION RISK SCREEN: I have an illness or condition that made me change the kind and/or amount of food I eat 0 No I eat fewer than two meals per day 0 No I eat few fruits and vegetables, or milk products 0 No I have three or more drinks of beer, liquor or wine almost every day 0 No I have tooth or mouth problems that make it hard for me to eat 0 No I don't always have enough money to buy the food I need 0 No I eat alone most of the time 0 No I take three or more different prescribed or over-the-counter drugs a day 1 Yes Without wanting to, I have lost or gained 10 pounds in the last six months 0 No I am not always physically able to shop, cook and/or feed myself 0 No Nutrition Protocols Good Risk Protocol Moderate Risk Protocol High Risk Proctocol Risk Level: Good Risk Score: 1 Electronic Signature([name]) Signed: 01/25/2023 4:48:42 PM By: Redmond Pulling RN, BSN Entered By: Redmond Pulling on 01/25/2023  09:42:05

## 2023-02-01 ENCOUNTER — Encounter (HOSPITAL_BASED_OUTPATIENT_CLINIC_OR_DEPARTMENT_OTHER): Payer: Medicare Other | Admitting: General Surgery

## 2023-02-01 DIAGNOSIS — E11622 Type 2 diabetes mellitus with other skin ulcer: Secondary | ICD-10-CM | POA: Diagnosis not present

## 2023-02-02 NOTE — Progress Notes (Signed)
[name] [name] [name] ([id number]) [id number] Page 1 of 7 Visit Report for 02/01/2023 Arrival Information Details Patient Name: Date of Service: [name] [name] [name], [name] [name] [name] [name] [name] [name]. 02/01/2023 10:45 A M Medical Record Number: [id number] Patient Account Number: [id number] Date of Birth/Sex: Treating RN: 1981-08-22 (42 y.o. [name] [name] [name] Primary Care Roslyn Else: Alysia Penna Other Clinician: Referring Dennisha Mouser: Treating Tanny Harnack/Extender: Sherlynn Stalls in Treatment: 1 Visit Information History Since Last Visit Added or deleted any medications: No Patient Arrived: Ambulatory Any new allergies or adverse reactions: No Arrival Time: 10:39 Had a fall or experienced change in No Accompanied By: self activities of daily living that may affect Transfer Assistance: None risk of falls: Patient Identification Verified: Yes Signs or symptoms of abuse/neglect since last visito No Secondary Verification Process Completed: Yes Hospitalized since last visit: No Implantable device outside of the clinic excluding No cellular tissue based products placed in the center since last visit: Has Dressing in Place as Prescribed: Yes Has Compression in Place as Prescribed: Yes Pain Present Now: No Electronic Signature([name] [name]) Signed: 02/01/2023 4:21:55 PM By: Samuella Bruin Entered By: Samuella Bruin on 02/01/2023 10:41:01 -------------------------------------------------------------------------------- Compression Therapy Details Patient Name: Date of Service: [name] [name] [name], [name] [name] [name] [place] [name] [name]. 02/01/2023 10:45 A M Medical Record Number: [id number] Patient Account Number: [id number] Date of Birth/Sex: Treating RN: 1981/03/20 (42 y.o. [name] [name] [name] Primary Care Mustaf Antonacci: Alysia Penna Other Clinician: Referring Lamisha Roussell: Treating Ressie Slevin/Extender: Sherlynn Stalls in Treatment: 1 Compression Therapy Performed for Wound Assessment: Wound #1  Left,Circumferential Lower Leg Performed By: Clinician Samuella Bruin, RN Compression Type: Double Layer Post Procedure Diagnosis Same as Pre-procedure Electronic Signature([name] [name]) Signed: 02/01/2023 4:21:55 PM By: Samuella Bruin Entered By: Samuella Bruin on 02/01/2023 11:03:56 -------------------------------------------------------------------------------- Encounter Discharge Information Details Patient Name: Date of Service: [name] [name] [name], [name] [name] [name] [place] [name] [name]. 02/01/2023 10:45 A M Medical Record Number: [id number] Patient Account Number: [id number] Date of Birth/Sex: Treating RN: 02-Dec-1980 (42 y.o. [name] [name] [name] Primary Care Cabria Micalizzi: Alysia Penna Other Clinician: Referring Juston Goheen: Treating Ashanti Littles/Extender: Sherlynn Stalls in Treatment: 1 Encounter Discharge Information Items Post Procedure Vitals Discharge Condition: Stable Temperature (F): 98.4 Ambulatory Status: Ambulatory Pulse (bpm): 101 Discharge Destination: Home Respiratory Rate (breaths/min): 18 Transportation: Private Auto Blood Pressure (mmHg): 136/82 [place], [name] [name] [name] ([id number]) [id number] Page 2 of 7 Accompanied By: self Schedule Follow-up Appointment: Yes Clinical Summary of Care: Patient Declined Electronic Signature([name] [name]) Signed: 02/01/2023 4:21:55 PM By: Samuella Bruin Entered By: Samuella Bruin on 02/01/2023 11:40:24 -------------------------------------------------------------------------------- Lower Extremity Assessment Details Patient Name: Date of Service: [name] [name] [name], [name] [name] [name] [place] [name] [name]. 02/01/2023 10:45 A M Medical Record Number: [id number] Patient Account Number: [id number] Date of Birth/Sex: Treating RN: 12-27-1980 (42 y.o. [name] [name] [name] Primary Care Samrat Hayward: Alysia Penna Other Clinician: Referring Dewanda Fennema: Treating Thais Silberstein/Extender: Sherlynn Stalls in Treatment: 1 Edema Assessment Assessed: [Left: No]  [Right: No] Edema: [Left: Ye] [Right: [name] [name]] Calf Left: Right: Point of Measurement: 32 cm From Medial Instep 60 cm Ankle Left: Right: Point of Measurement: 11 cm From Medial Instep 28.9 cm Vascular Assessment Pulses: Dorsalis Pedis Palpable: [Left:Yes] Electronic Signature([name] [name]) Signed: 02/01/2023 4:21:55 PM By: Samuella Bruin Entered By: Samuella Bruin on 02/01/2023 10:48:58 -------------------------------------------------------------------------------- Multi Wound Chart Details Patient Name: Date of Service: [name] [name] [name], [name] [name] [name] [place] [name] [name]. 02/01/2023 10:45 A M Medical Record Number: [id number] Patient Account Number: [id number] Date of Birth/Sex: Treating RN: 05-11-1981 (42 y.o. F) Primary Care Cristen Bredeson: Alysia Penna Other Clinician: Referring Leyda Vanderwerf: Treating Aliana Kreischer/Extender: Sherlynn Stalls in Treatment: 1  Vital Signs Height(in): 65 Pulse(bpm): 101 Weight(lbs): 430 Blood Pressure(mmHg): 136/82 Body Mass Index(BMI): 71.5 Temperature(F): 98.4 Respiratory Rate(breaths/min): 18 [1:Photos:] [N/A:N/A] Left, Circumferential Lower Leg N/A N/A Wound Location: Gradually Appeared N/A N/A Wounding Event: Venous Leg Ulcer N/A N/A Primary Etiology: Lymphedema, Sleep Apnea, N/A N/A Comorbid History: Hypertension, Peripheral Venous Disease, Type II Diabetes 01/25/2023 N/A N/A Date Acquired: 1 N/A N/A Weeks of Treatment: Open N/A N/A Wound Status: No N/A N/A Wound Recurrence: 11.5x26x0.1 N/A N/A Measurements L x W x D (cm) 234.834 N/A N/A A (cm) : rea 23.483 N/A N/A Volume (cm) : 10.30% N/A N/A % Reduction in Area: 10.30% N/A N/A % Reduction in Volume: Full Thickness Without Exposed N/A N/A Classification: Support Structures Large N/A N/A Exudate Amount: Serous N/A N/A Exudate Type: amber N/A N/A Exudate Color: Yes N/A N/A Foul Odor A Cleansing: fter No N/A N/A Odor Anticipated Due to Product Use: Distinct, outline attached N/A  N/A Wound Margin: Small (1-33%) N/A N/A Granulation A mount: Red, Pink N/A N/A Granulation Quality: Large (67-100%) N/A N/A Necrotic Amount: Fat Layer (Subcutaneous Tissue): Yes N/A N/A Exposed Structures: Fascia: No Tendon: No Muscle: No Joint: No Bone: No None N/A N/A Epithelialization: Debridement - Selective/Open Wound N/A N/A Debridement: Pre-procedure Verification/Time Out 10:57 N/A N/A Taken: Lidocaine 4% Topical Solution N/A N/A Pain Control: Slough N/A N/A Tissue Debrided: Non-Viable Tissue N/A N/A Level: 164.3 N/A N/A Debridement A (sq cm): rea Curette N/A N/A Instrument: Minimum N/A N/A Bleeding: Pressure N/A N/A Hemostasis A chieved: Procedure was tolerated well N/A N/A Debridement Treatment Response: 11.5x26x0.1 N/A N/A Post Debridement Measurements L x W x D (cm) 23.483 N/A N/A Post Debridement Volume: (cm) No Abnormalities Noted N/A N/A Periwound Skin Texture: Maceration: Yes N/A N/A Periwound Skin Moisture: Hemosiderin Staining: Yes N/A N/A Periwound Skin Color: No Abnormality N/A N/A Temperature: Compression Therapy N/A N/A Procedures Performed: Debridement Treatment Notes Electronic Signature([name] [name]) Signed: 02/01/2023 11:38:40 AM By: Duanne Guess MD FACS Entered By: Duanne Guess on 02/01/2023 11:38:40 -------------------------------------------------------------------------------- Multi-Disciplinary Care Plan Details Patient Name: Date of Service: [name] [name] [name], [name] [name] [name] [place] [name] [name]. 02/01/2023 10:45 A M Medical Record Number: [id number] Patient Account Number: [id number] Date of Birth/Sex: Treating RN: 12/28/1980 (42 y.o. [name] [name] [name] Primary Care Kylan Veach: Alysia Penna Other Clinician: Referring Charmion Hapke: Treating Anquanette Bahner/Extender: Sherlynn Stalls in Treatment: 1 Active Inactive Orientation to the [facility] [name] [name] [name], [name] [name] [id number] Page 4 of 7 Nursing  Diagnoses: Knowledge deficit related to the wound healing center program Goals: Patient/caregiver will verbalize understanding of the Wound Healing Center Program Date Initiated: 01/25/2023 Target Resolution Date: 02/10/2023 Goal Status: Active Interventions: Provide education on orientation to the wound center Notes: Venous Leg Ulcer Nursing Diagnoses: Actual venous Insuffiency (use after diagnosis is confirmed) Knowledge deficit related to disease process and management Goals: Patient will maintain optimal edema control Date Initiated: 01/25/2023 Target Resolution Date: 03/23/2023 Goal Status: Active Patient/caregiver will verbalize understanding of disease process and disease management Date Initiated: 01/25/2023 Target Resolution Date: 03/23/2023 Goal Status: Active Interventions: Assess peripheral edema status every visit. Compression as ordered Provide education on venous insufficiency Notes: Wound/Skin Impairment Nursing Diagnoses: Impaired tissue integrity Knowledge deficit related to ulceration/compromised skin integrity Goals: Patient/caregiver will verbalize understanding of skin care regimen Date Initiated: 01/25/2023 Target Resolution Date: 03/30/2023 Goal Status: Active Ulcer/skin breakdown will have a volume reduction of 30% by week 4 Date Initiated: 01/25/2023 Target Resolution Date: 02/22/2023 Goal Status: Active Interventions: Assess patient/caregiver ability to obtain necessary supplies Assess patient/caregiver  ability to perform ulcer/skin care regimen upon admission and as needed Assess ulceration([name] [name]) every visit Provide education on ulcer and skin care Notes: Electronic Signature([name] [name]) Signed: 02/01/2023 4:21:55 PM By: Samuella Bruin Entered By: Samuella Bruin on 02/01/2023 11:00:01 -------------------------------------------------------------------------------- Pain Assessment Details Patient Name: Date of Service: [name] [name] [name], [name] [name] [name] [place] [name] [name]. 02/01/2023 10:45 A  M Medical Record Number: [id number] Patient Account Number: [id number] Date of Birth/Sex: Treating RN: 12-22-1980 (42 y.o. [name] [name] [name] Primary Care Aarish Rockers: Alysia Penna Other Clinician: Referring Vedika Dumlao: Treating Jaydalynn Olivero/Extender: Sherlynn Stalls in Treatment: 1 Active Problems Location of Pain Severity and Description of Pain [name] [name] [name], [name] [name] [name] ([id number]) [id number] Page 5 of 7 Patient Has Paino No Site Locations Rate the pain. Current Pain Level: 0 Pain Management and Medication Current Pain Management: Electronic Signature([name] [name]) Signed: 02/01/2023 4:21:55 PM By: Samuella Bruin Entered By: Samuella Bruin on 02/01/2023 10:41:37 -------------------------------------------------------------------------------- Patient/Caregiver Education Details Patient Name: Date of Service: [name], [name] [name] [name] [place] Kathie Rhodes 6/11/2024andnbsp10:45 A M Medical Record Number: [id number] Patient Account Number: [id number] Date of Birth/Gender: Treating RN: 04/06/1981 (42 y.o. [name] [name] [name] Primary Care Physician: Alysia Penna Other Clinician: Referring Physician: Treating Physician/Extender: Sherlynn Stalls in Treatment: 1 Education Assessment Education Provided To: Patient Education Topics Provided Infection: Methods: Explain/Verbal Responses: Reinforcements needed, State content correctly Electronic Signature([name] [name]) Signed: 02/01/2023 4:21:55 PM By: Samuella Bruin Entered By: Samuella Bruin on 02/01/2023 11:00:28 -------------------------------------------------------------------------------- Wound Assessment Details Patient Name: Date of Service: [name] [name] [name], [name] [name] [name] [place] [name] [name]. 02/01/2023 10:45 A M Medical Record Number: [id number] Patient Account Number: [id number] Date of Birth/Sex: Treating RN: July 11, 1981 (42 y.o. [name] [name] [name] Primary Care Zofia Peckinpaugh: Alysia Penna Other Clinician: Referring  Howie Rufus: Treating Toshiyuki Fredell/Extender: Sherlynn Stalls in Treatment: 1 Wound Status Wound Number: 1 Primary Venous Leg Ulcer Etiology: Wound Location: Left, Circumferential Lower Leg Wound Open Wounding Event: Gradually Appeared Status: Date Acquired: 01/25/2023 [name] [name] [name], [name] [name] [name] ([id number]) [id number] Page 6 of 7 Date Acquired: 01/25/2023 Comorbid Lymphedema, Sleep Apnea, Hypertension, Peripheral Venous Weeks Of Treatment: 1 History: Disease, Type II Diabetes Clustered Wound: No Photos Wound Measurements Length: (cm) 11.5 Width: (cm) 26 Depth: (cm) 0.1 Area: (cm) 234.834 Volume: (cm) 23.483 % Reduction in Area: 10.3% % Reduction in Volume: 10.3% Epithelialization: None Tunneling: No Undermining: No Wound Description Classification: Full Thickness Without Exposed Support Structures Wound Margin: Distinct, outline attached Exudate Amount: Large Exudate Type: Serous Exudate Color: amber Foul Odor After Cleansing: Yes Due to Product Use: No Slough/Fibrino Yes Wound Bed Granulation Amount: Small (1-33%) Exposed Structure Granulation Quality: Red, Pink Fascia Exposed: No Necrotic Amount: Large (67-100%) Fat Layer (Subcutaneous Tissue) Exposed: Yes Necrotic Quality: Adherent Slough Tendon Exposed: No Muscle Exposed: No Joint Exposed: No Bone Exposed: No Periwound Skin Texture Texture Color No Abnormalities Noted: Yes No Abnormalities Noted: No Hemosiderin Staining: Yes Moisture No Abnormalities Noted: No Temperature / Pain Maceration: Yes Temperature: No Abnormality Treatment Notes Wound #1 (Lower Leg) Wound Laterality: Left, Circumferential Cleanser Soap and Water Discharge Instruction: May shower and wash wound with dial antibacterial soap and water prior to dressing change. Vashe 5.8 (oz) Discharge Instruction: Cleanse the wound with Vashe prior to applying a clean dressing using gauze sponges, not tissue or  cotton balls. Peri-Wound Care Sween Lotion (Moisturizing lotion) Discharge Instruction: Apply moisturizing lotion as directed Topical Gentamicin Discharge Instruction: As directed by physician Mupirocin Ointment Discharge Instruction: Apply Mupirocin (Bactroban) as instructed Ketoconazole Cream 2% Discharge Instruction: Apply Ketoconazole as directed Primary Dressing Maxorb Extra Ag+ Alginate Dressing, 4x4.75 (in/in)  [name] [name] [name], [name] [name] [name] ([id number]) [id number] Page 7 of 7 Discharge Instruction: Apply to wound bed as instructed Secondary Dressing ABD Pad, 8x10 Discharge Instruction: Apply over primary dressing as directed. Secured With Compression Wrap Urgo K2, (equivalent to a 4 layer) two layer compression system, regular Discharge Instruction: Apply Urgo K2 as directed (alternative to 4 layer compression). CoFlex Zinc Unna Boot, 4 x 6 (in/yd) Discharge Instruction: Apply Coflex Zinc Unna Boot to top of wrap . Compression Stockings Add-Ons Electronic Signature([name] [name]) Signed: 02/01/2023 4:21:55 PM By: Samuella Bruin Signed: 02/01/2023 5:26:07 PM By: Karie Schwalbe RN Entered By: Karie Schwalbe on 02/01/2023 10:53:22 -------------------------------------------------------------------------------- Vitals Details Patient Name: Date of Service: [name] [name] [name], [name] [name] [name] [place] [name] [name]. 02/01/2023 10:45 A M Medical Record Number: [id number] Patient Account Number: [id number] Date of Birth/Sex: Treating RN: 24-Feb-1981 (42 y.o. [name] [name] [name] Primary Care Cecillia Menees: Alysia Penna Other Clinician: Referring Bern Fare: Treating Sita Mangen/Extender: Sherlynn Stalls in Treatment: 1 Vital Signs Time Taken: 10:41 Temperature (F): 98.4 Height (in): 65 Pulse (bpm): 101 Weight (lbs): 430 Respiratory Rate (breaths/min): 18 Body Mass Index (BMI): 71.5 Blood Pressure (mmHg): 136/82 Reference Range: 80 - 120 mg / dl Electronic Signature([name] [name]) Signed:  02/01/2023 4:21:55 PM By: Samuella Bruin Entered By: Samuella Bruin on 02/01/2023 10:41:31

## 2023-02-02 NOTE — Progress Notes (Addendum)
DANNIKA, CRAGUN ([id number]) [id number] Page 1 of 9 Visit Report for 02/01/2023 Chief Complaint Document Details Patient Name: Date of Service: [name] [name], TIFFA [place] S. 02/01/2023 10:45 A M Medical Record Number: [id number] Patient Account Number: [id number] Date of Birth/Sex: Treating RN: [birth year] (42 y.o. F) Primary Care Provider: Alysia Penna Other Clinician: Referring Provider: Treating Provider/Extender: Sherlynn Stalls in Treatment: 1 Information Obtained from: Patient Chief Complaint Patient presents for treatment of an open ulcer due to venous insufficiency in the setting of severe stage 3 lymphedema Electronic Signature(s) Signed: 02/01/2023 11:38:47 AM By: Duanne Guess MD FACS Entered By: Duanne Guess on 02/01/2023 11:38:46 -------------------------------------------------------------------------------- Debridement Details Patient Name: Date of Service: [name] [name], TIFFA [place] S. 02/01/2023 10:45 A M Medical Record Number: [id number] Patient Account Number: [id number] Date of Birth/Sex: Treating RN: [birth year] (42 y.o. Fredderick Phenix Primary Care Provider: Alysia Penna Other Clinician: Referring Provider: Treating Provider/Extender: Sherlynn Stalls in Treatment: 1 Debridement Performed for Assessment: Wound #1 Left,Circumferential Lower Leg Performed By: Physician Duanne Guess, MD Debridement Type: Debridement Severity of Tissue Pre Debridement: Fat layer exposed Level of Consciousness (Pre-procedure): Awake and Alert Pre-procedure Verification/Time Out Yes - 10:57 Taken: Start Time: 10:57 Pain Control: Lidocaine 4% T opical Solution Percent of Wound Bed Debrided: 70% T Area Debrided (cm): otal 164.3 Tissue and other material debrided: Non-Viable, Slough, Slough Level: Non-Viable Tissue Debridement Description: Selective/Open Wound Instrument: Curette Bleeding:  Minimum Hemostasis Achieved: Pressure Response to Treatment: Procedure was tolerated well Level of Consciousness (Post- Awake and Alert procedure): Post Debridement Measurements of Total Wound Length: (cm) 11.5 Width: (cm) 26 Depth: (cm) 0.1 Volume: (cm) 23.483 Character of Wound/Ulcer Post Debridement: Improved Severity of Tissue Post Debridement: Fat layer exposed Post Procedure Diagnosis Pekar, Jewelz S ([id number]) [id number] Page 2 of 9 Same as Pre-procedure Notes scribed for Dr. Lady Gary by Samuella Bruin, RN Electronic Signature(s) Signed: 02/01/2023 11:44:34 AM By: Duanne Guess MD FACS Signed: 02/01/2023 4:21:55 PM By: Samuella Bruin Entered By: Samuella Bruin on 02/01/2023 11:03:25 -------------------------------------------------------------------------------- HPI Details Patient Name: Date of Service: [name] [name], TIFFA [place] S. 02/01/2023 10:45 A M Medical Record Number: [id number] Patient Account Number: [id number] Date of Birth/Sex: Treating RN: 1981/05/09 (42 y.o. F) Primary Care Provider: Alysia Penna Other Clinician: Referring Provider: Treating Provider/Extender: Sherlynn Stalls in Treatment: 1 History of Present Illness HPI Description: ADMISSION 01/25/2023 This is a 42 year old type II diabetic (last hemoglobin A1c 6.9%) who is super morbidly obese and relies on a tracheostomy as well as oxygen via nasal cannula during the day. She has congestive heart failure and the skin changes in her legs are consistent with lymphedema. She was referred to [place] for venous stasis dermatitis however she has circumferential breakdown of the skin on her left leg. There is thick slough formation. ABIs were noncompressible, secondary to the lymphedema, but she does have a palpable dorsalis pedis pulse. 02/01/2023: The wound is covered with a thick layer of yellowish-gray slimy slough and has a strong putrid odor. Electronic  Signature(s) Signed: 02/01/2023 11:39:31 AM By: Duanne Guess MD FACS Entered By: Duanne Guess on 02/01/2023 11:39:31 -------------------------------------------------------------------------------- Physical Exam Details Patient Name: Date of Service: [name] [name], TIFFA [place] S. 02/01/2023 10:45 A M Medical Record Number: [id number] Patient Account Number: [id number] Date of Birth/Sex: Treating RN: 1980-11-04 (42 y.o. F) Primary Care Provider: Alysia Penna Other Clinician: Referring Provider: Treating Provider/Extender: Sherlynn Stalls in Treatment: 1 Constitutional . Slightly tachycardic. . . no  acute distress. Respiratory Normal work of breathing on supplemental oxygen. Notes 02/01/2023: The wound is covered with a thick layer of yellowish-gray slimy slough and has a strong putrid odor. Electronic Signature(s) Signed: 02/01/2023 11:40:15 AM By: Duanne Guess MD FACS Entered By: Duanne Guess on 02/01/2023 11:40:14 Gallaga, [name] ([id number]) [id number] Page 3 of 9 -------------------------------------------------------------------------------- Physician Orders Details Patient Name: Date of Service: [name] [name], TIFFA [place] S. 02/01/2023 10:45 A M Medical Record Number: [id number] Patient Account Number: [id number] Date of Birth/Sex: Treating RN: 12-26-1980 (42 y.o. 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 - Dr Lady Gary - room 1 Return A Anesthetic Wound #1 Left,Circumferential Lower Leg (In clinic) Topical Lidocaine 4% applied to wound bed Bathing/ Shower/ Hygiene May shower with protection but do not get wound dressing(s) wet. Protect dressing(s) with water repellant cover (for example, large plastic bag) or a cast cover and may then take shower. Edema Control - Lymphedema / SCD / Other Bilateral Lower Extremities Elevate legs to the level of the heart or above for 30 minutes daily and/or when sitting for 3-4 times a day throughout the day. Avoid standing for long periods of time. Wound Treatment Wound #1 - Lower Leg Wound Laterality: Left, Circumferential Cleanser: Soap and Water 1 x Per Week/30 Days Discharge Instructions: May shower and wash wound with dial antibacterial soap and water prior to dressing change. Cleanser: Vashe 5.8 (oz) 1 x Per Week/30 Days Discharge Instructions: Cleanse the wound with Vashe prior to applying a clean dressing using gauze sponges, not tissue or cotton balls. Peri-Wound Care: Sween Lotion (Moisturizing lotion) 1 x Per Week/30 Days Discharge Instructions: Apply moisturizing lotion as directed Topical: Gentamicin 1 x Per Week/30 Days Discharge Instructions: As directed by physician Topical: Mupirocin Ointment 1 x Per Week/30 Days Discharge Instructions: Apply Mupirocin (Bactroban) as instructed Topical: Ketoconazole Cream 2% 1 x Per Week/30 Days Discharge Instructions: Apply Ketoconazole as directed Prim Dressing: Maxorb Extra Ag+ Alginate Dressing, 4x4.75 (in/in) 1 x Per Week/30 Days ary Discharge Instructions: Apply to wound bed as instructed Secondary Dressing: ABD Pad, 8x10 1 x Per Week/30 Days Discharge Instructions: Apply over primary dressing as directed. Compression Wrap: Urgo K2, (equivalent to  a 4 layer) two layer compression system, regular 1 x Per Week/30 Days Discharge Instructions: Apply Urgo K2 as directed (alternative to 4 layer compression). MALEEHA, GRANNIS ([id number]) [id number] Page 4 of 9 Compression Wrap: CoFlex Zinc Unna Boot, 4 x 6 (in/yd) 1 x Per Week/30 Days Discharge Instructions: Apply Coflex Zinc Unna Boot to top of wrap . Laboratory naerobe culture (MICRO) - PCR of nonhealing wound to left lower leg - (ICD10 E11.622 - Type 2 Bacteria identified in Unspecified specimen by A diabetes mellitus with other skin ulcer) LOINC Code: 635-3 Convenience Name: Anaerobic culture Patient Medications llergies: No Known Allergies A Notifications Medication Indication Start End 02/01/2023 lidocaine DOSE topical 4 % cream - cream topical Electronic Signature(s) Signed: 02/03/2023 12:40:38 PM By: Duanne Guess MD FACS Signed: 02/03/2023 3:49:47 PM  By: Samuella Bruin Previous Signature: 02/01/2023 11:44:34 AM Version By: Duanne Guess MD FACS Entered By: Samuella Bruin on 02/03/2023 12:31:38 -------------------------------------------------------------------------------- Problem List Details Patient Name: Date of Service: [name] [name], TIFFA [place] S. 02/01/2023 10:45 A M Medical Record Number: [id number] Patient Account Number: [id number] Date of Birth/Sex: Treating RN: Feb 11, 1981 (42 y.o. F) Primary Care Provider: Alysia Penna Other Clinician: Referring Provider: Treating Provider/Extender: Sherlynn Stalls in Treatment: 1 Active Problems ICD-10 Encounter Code Description Active Date MDM Diagnosis [phone] Non-pressure chronic ulcer of other part of left lower leg with fat layer exposed6/11/2022 No Yes E11.622 Type 2 diabetes mellitus with other skin ulcer 01/25/2023 No Yes I89.0 Lymphedema, not elsewhere classified 01/25/2023 No Yes I87.2 Venous insufficiency (chronic) (peripheral) 01/25/2023 No Yes I50.32 Chronic  diastolic (congestive) heart failure 01/25/2023 No Yes E66.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 01/25/2023 No Yes Z93.0 Tracheostomy status 01/25/2023 No Yes Terral, Myisha S ([id number]) [id number] Page 5 of 9 Z99.81 Dependence on supplemental oxygen 01/25/2023 No Yes Inactive Problems Resolved Problems Electronic Signature(s) Signed: 02/01/2023 11:38:30 AM By: Duanne Guess MD FACS Entered By: Duanne Guess on 02/01/2023 11:38:30 -------------------------------------------------------------------------------- Progress Note Details Patient Name: Date of Service: [name], TIFFA [place] S. 02/01/2023 10:45 A M Medical Record Number: [id number] Patient Account Number: [id number] Date of Birth/Sex: Treating RN: 1980/12/27 (42 y.o. F) Primary Care Provider: Alysia Penna Other Clinician: Referring Provider: Treating Provider/Extender: Sherlynn Stalls in Treatment: 1 Subjective Chief Complaint Information obtained from Patient Patient presents for treatment of an open ulcer due to venous insufficiency in the setting of severe stage 3 lymphedema History of Present Illness (HPI) ADMISSION 01/25/2023 This is a 41 year old type II diabetic (last hemoglobin A1c 6.9%) who is super morbidly obese and relies on a tracheostomy as well as oxygen via nasal cannula during the day. She has congestive heart failure and the skin changes in her legs are consistent with lymphedema. She was referred to [place] for venous stasis dermatitis however she has circumferential breakdown of the skin on her left leg. There is thick slough formation. ABIs were noncompressible, secondary to the lymphedema, but she does have a palpable dorsalis pedis pulse. 02/01/2023: The wound is covered with a thick layer of yellowish-gray slimy slough and has a strong putrid odor. Patient History Information obtained from Patient. Family History Cancer - Paternal  Grandparents, Diabetes - Mother, Heart Disease - Maternal Grandparents, Hypertension - Mother, No family history of Kidney Disease, Lung Disease, Seizures, Stroke, Thyroid Problems, Tuberculosis. Social History Never smoker, Marital Status - Single, Alcohol Use - Never, Drug Use - No History, Caffeine Use - Never. Medical History Hematologic/Lymphatic Patient has history of Lymphedema Respiratory Patient has history of Sleep Apnea Cardiovascular Patient has history of Hypertension, Peripheral Venous Disease Endocrine Patient has history of Type II Diabetes Integumentary (Skin) Denies history of History of Burn Hospitalization/Surgery History - tracheostomy 2017. Medical A Surgical History Notes nd Respiratory chronic respiratory failure tracheostomy dependent Cardiovascular diastolic heart failure pericardial effusion pulmonary hypertension Gastrointestinal hepatic congestion dysphagia Endocrine OLETHIA, FEHRINGER ([id number]) [id number] Page 6 of 9 hyperlipidemia, morbid obesity Objective Constitutional Slightly tachycardic. no acute distress. Vitals Time Taken: 10:41 AM, Height: 65 in, Weight: 430 lbs, BMI: 71.5, Temperature: 98.4 F, Pulse: 101 bpm, Respiratory Rate: 18 breaths/min, Blood Pressure: 136/82 mmHg. Respiratory Normal work of breathing on supplemental oxygen. General Notes: 02/01/2023: The wound is covered with a thick layer of yellowish-gray slimy slough and has a strong putrid odor. Integumentary (Hair, Skin) Wound #  1 status is Open. Original cause of wound was Gradually Appeared. The date acquired was: 01/25/2023. The wound has been in treatment 1 weeks. The wound is located on the Left,Circumferential Lower Leg. The wound measures 11.5cm length x 26cm width x 0.1cm depth; 234.834cm^2 area and 23.483cm^3 volume. There is Fat Layer (Subcutaneous Tissue) exposed. There is no tunneling or undermining noted. There is a large amount of serous  drainage noted. Foul odor after cleansing was noted. The wound margin is distinct with the outline attached to the wound base. There is small (1-33%) red, pink granulation within the wound bed. There is a large (67-100%) amount of necrotic tissue within the wound bed including Adherent Slough. The periwound skin appearance had no abnormalities noted for texture. The periwound skin appearance exhibited: Maceration, Hemosiderin Staining. Periwound temperature was noted as No Abnormality. 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 There was a Selective/Open Wound Non-Viable Tissue Debridement with a total area of 164.3 sq cm performed by Duanne Guess, MD. With the following instrument(s): Curette to remove Non-Viable tissue/material. Material removed includes [facility] after achieving pain control using Lidocaine 4% Topical Solution. No specimens were taken. A time out was conducted at 10:57, prior to the start of the procedure. A Minimum amount of bleeding was controlled with Pressure. The procedure was tolerated well. Post Debridement Measurements: 11.5cm length x 26cm width x 0.1cm depth; 23.483cm^3 volume. Character of Wound/Ulcer Post Debridement is improved. Severity of Tissue Post Debridement is: Fat layer exposed. Post procedure Diagnosis Wound #1: Same as Pre-Procedure General Notes: scribed for Dr. Lady Gary by Samuella Bruin, RN. Pre-procedure diagnosis  of Wound #1 is a Venous Leg Ulcer located on the Left,Circumferential Lower Leg . There was a Double Layer Compression Therapy Procedure by Samuella Bruin, RN. Post procedure Diagnosis Wound #1: Same as Pre-Procedure Plan Follow-up Appointments: Return Appointment in 1 week. - Dr Lady Gary - room 1 Anesthetic: Wound #1 Left,Circumferential Lower Leg: (In clinic) Topical Lidocaine 4% applied to wound bed Bathing/ Shower/ Hygiene: May shower with protection but do not get wound dressing(s) wet. Protect dressing(s) with water repellant cover (for example, large plastic bag) or a cast cover CATHERENE, NORRINGTON ([id number]) [id number] Page 7 of 9 and may then take shower. Edema Control - Lymphedema / SCD / Other: Elevate legs to the level of the heart or above for 30 minutes daily and/or when sitting for 3-4 times a day throughout the day. Avoid standing for long periods of time. Laboratory ordered were: Anaerobic culture - PCR of nonhealing wound to left lower leg The following medication(s) was prescribed: lidocaine topical 4 % cream cream topical was prescribed at facility WOUND #1: - Lower Leg Wound Laterality: Left, Circumferential Cleanser: Soap and Water 1 x Per Week/30 Days Discharge Instructions: May shower and wash wound with dial antibacterial soap and water prior to dressing change. Cleanser: Vashe 5.8 (oz) 1 x Per Week/30 Days Discharge Instructions: Cleanse the wound with Vashe prior to applying a clean dressing using gauze sponges, not tissue or cotton balls. Peri-Wound Care: Sween Lotion (Moisturizing lotion) 1 x Per Week/30  Days Discharge Instructions: Apply moisturizing lotion as directed Topical: Gentamicin 1 x Per Week/30 Days Discharge Instructions: As directed by physician Topical: Mupirocin Ointment 1 x Per Week/30 Days Discharge Instructions: Apply Mupirocin (Bactroban) as instructed Topical: Ketoconazole Cream 2% 1 x Per Week/30  Days Discharge Instructions: Apply Ketoconazole as directed Prim Dressing: Maxorb Extra Ag+ Alginate Dressing, 4x4.75 (in/in) 1 x Per Week/30 Days ary Discharge Instructions: Apply to wound bed as instructed Secondary Dressing: ABD Pad, 8x10 1 x Per Week/30 Days Discharge Instructions: Apply over primary dressing as directed. Com pression Wrap: Urgo K2, (equivalent to a 4 layer) two layer compression system, regular 1 x Per Week/30 Days Discharge Instructions: Apply Urgo K2 as directed (alternative to 4 layer compression). Com pression Wrap: CoFlex Zinc Unna Boot, 4 x 6 (in/yd) 1 x Per Week/30 Days Discharge Instructions: Apply Coflex Zinc Unna Boot to top of wrap . 02/01/2023: The wound is covered with a thick layer of yellowish-gray slimy slough and has a strong putrid odor. I used a curette to debride slough from the entire wound surface. I then took a culture due to the change in the character of the slough, as well as the odor. I applied Vashe-moistened gauze to soak on the wound while her wrap was prepared. We are going to apply a mixture of topical gentamicin, mupirocin, and ketoconazole to her wound while I await culture data. She may benefit from a custom topical antibiotic compound once the data return. We will continue with silver alginate and 4-layer compression/equivalent. She was reminded of the importance of keeping her legs elevated. Follow-up in 1 week. Electronic Signature(s) Signed: 02/03/2023 12:40:38 PM By: Duanne Guess MD FACS Signed: 02/03/2023 3:49:47 PM By: Samuella Bruin Previous Signature: 02/01/2023 11:43:09 AM Version By: Duanne Guess MD FACS Entered By: Samuella Bruin on 02/03/2023 12:31:52 -------------------------------------------------------------------------------- HxROS Details Patient Name: Date of Service: [name], TIFFA [place] S. 02/01/2023 10:45 A M Medical Record Number: [id number] Patient Account Number: [id number] Date of Birth/Sex: Treating  RN: [birth year] (42 y.o. F) Primary Care Provider: Alysia Penna Other Clinician: Referring Provider: Treating Provider/Extender: Sherlynn Stalls in Treatment: 1 Information Obtained From Patient Hematologic/Lymphatic Medical History: Positive for: Lymphedema Respiratory Medical History: Positive for: Sleep Apnea Past Medical History Notes: chronic respiratory failure tracheostomy dependent Cardiovascular Roseman, Josetta S ([id number]) [id number] Page 8 of 9 Medical History: Positive for: Hypertension; Peripheral Venous Disease Past Medical History Notes: diastolic heart failure pericardial effusion pulmonary hypertension Gastrointestinal Medical History: Past Medical History Notes: hepatic congestion dysphagia Endocrine Medical History: Positive for: Type II Diabetes Past Medical History Notes: hyperlipidemia, morbid obesity Time with diabetes: <1 month Treated with: Oral agents Blood sugar tested every day: No Integumentary (Skin) Medical History: Negative for: History of Burn Immunizations Pneumococcal Vaccine: Received Pneumococcal Vaccination: No Implantable Devices None Hospitalization / Surgery History Type of Hospitalization/Surgery tracheostomy 2017 Family and Social History Cancer: Yes - Paternal Grandparents; Diabetes: Yes - Mother; Heart Disease: Yes - Maternal Grandparents; Hypertension: Yes - Mother; Kidney Disease: No; Lung Disease: No; Seizures: No; Stroke: No; Thyroid Problems: No; Tuberculosis: No; Never smoker; Marital Status - Single; Alcohol Use: Never; Drug Use: No History; Caffeine Use: Never; Financial Concerns: No; Food, Clothing or Shelter Needs: No; Support System Lacking: No; Transportation Concerns: No Electronic Signature(s) Signed: 02/01/2023 11:44:34 AM By: Duanne Guess MD FACS Entered By: Duanne Guess on 02/01/2023  11:39:37 -------------------------------------------------------------------------------- SuperBill Details Patient Name: Date of Service: [name], TIFFA [place] S. 02/01/2023 Medical Record Number: [id number] Patient Account  Number: [id number] Date of Birth/Sex: Treating RN: 1980-12-11 (42 y.o. 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 Z93.0 Tracheostomy status Z99.81 Dependence on supplemental oxygen Facility Procedures : CPT4 Code: [id number] Description: 97597 - DEBRIDE WOUND 1ST 20 SQ CM OR < ICD-10 Diagnosis Description L97.822 Non-pressure chronic ulcer of other part of left lower leg with fat layer exposed Modifier: Quantity: 1 : CPT4 Code: 84696295 Description: 97598 - DEBRIDE WOUND EA ADDL 20 SQ CM ICD-10 Diagnosis Description L97.822 Non-pressure chronic ulcer of other part of left lower leg with fat layer exposed Modifier: Quantity: 8 Physician Procedures : CPT4 Code Description Modifier [id number] 99214 - WC PHYS LEVEL 4 - EST PT 25 ICD-10 Diagnosis Description L97.822 Non-pressure chronic ulcer of other part of left lower leg with fat layer exposed E11.622 Type 2 diabetes mellitus with other skin ulcer  I89.0 Lymphedema, not elsewhere classified I87.2 Venous insufficiency (chronic) (peripheral) Quantity: 1 : [id number] 97597 - WC PHYS DEBR WO ANESTH 20 SQ CM ICD-10 Diagnosis Description  L97.822 Non-pressure chronic ulcer of other part of left lower leg with fat layer exposed Quantity: 1 : [id number] 97598 - WC PHYS DEBR WO ANESTH EA ADD 20 CM ICD-10 Diagnosis Description L97.822 Non-pressure chronic ulcer of other part of left lower leg with fat layer exposed Quantity: 8 Electronic Signature(s) Signed: 02/01/2023 11:43:29 AM By: Duanne Guess MD FACS Entered By: Duanne Guess on 02/01/2023 11:43:29

## 2023-02-03 ENCOUNTER — Ambulatory Visit: Payer: Medicare Other | Admitting: Nurse Practitioner

## 2023-02-10 ENCOUNTER — Encounter: Payer: Self-pay | Admitting: Nurse Practitioner

## 2023-02-10 ENCOUNTER — Encounter (HOSPITAL_BASED_OUTPATIENT_CLINIC_OR_DEPARTMENT_OTHER): Payer: Medicare Other | Admitting: General Surgery

## 2023-02-10 ENCOUNTER — Telehealth: Payer: Self-pay

## 2023-02-10 ENCOUNTER — Ambulatory Visit (INDEPENDENT_AMBULATORY_CARE_PROVIDER_SITE_OTHER): Payer: Medicare Other | Admitting: Nurse Practitioner

## 2023-02-10 VITALS — BP 126/80 | HR 85 | Temp 98.1°F | Resp 18 | Ht 65.0 in | Wt >= 6400 oz

## 2023-02-10 DIAGNOSIS — Z6841 Body Mass Index (BMI) 40.0 and over, adult: Secondary | ICD-10-CM

## 2023-02-10 DIAGNOSIS — E0821 Diabetes mellitus due to underlying condition with diabetic nephropathy: Secondary | ICD-10-CM

## 2023-02-10 DIAGNOSIS — E11622 Type 2 diabetes mellitus with other skin ulcer: Secondary | ICD-10-CM | POA: Diagnosis not present

## 2023-02-10 DIAGNOSIS — E1169 Type 2 diabetes mellitus with other specified complication: Secondary | ICD-10-CM

## 2023-02-10 DIAGNOSIS — Z7984 Long term (current) use of oral hypoglycemic drugs: Secondary | ICD-10-CM

## 2023-02-10 DIAGNOSIS — J479 Bronchiectasis, uncomplicated: Secondary | ICD-10-CM

## 2023-02-10 LAB — MICROALBUMIN / CREATININE URINE RATIO
Creatinine,U: 15.6 mg/dL
Microalb Creat Ratio: 48.6 mg/g — ABNORMAL HIGH (ref 0.0–30.0)
Microalb, Ur: 7.6 mg/dL — ABNORMAL HIGH (ref 0.0–1.9)

## 2023-02-10 NOTE — Assessment & Plan Note (Addendum)
No adverse effect with rybelsus mg No glucose check at home. Last hgbA1c at % LDL at goal with atorvastatin Normal foot exam except long nails. She declined referral to podiatry at this time. Agreed to schedule Dm eye exam with mobile clinic.  Maintain med dose Collect UACr today F/up in 57month

## 2023-02-10 NOTE — Progress Notes (Signed)
Established Patient Visit  Patient: [name] [name]   DOB: 1981-03-05   42 y.o. Female  MRN: [id number] Visit Date: 02/10/2023  Subjective:    Chief Complaint  Patient presents with   Chronic Care Management    DIABETES, cellulitis, weight management   HPI DM (diabetes mellitus) (HCC) No adverse effect with rybelsus mg No glucose check at home. Last hgbA1c at % LDL at goal with atorvastatin Normal foot exam except long nails. She declined referral to podiatry at this time. Agreed to schedule Dm eye exam with mobile clinic.  Maintain med dose Collect UACr today F/up in 25month  Morbid obesity (HCC) Reports diet modification: high protein and fiber diet Had previous appointment with nutritionist. Declined need for another appt Exercise: home video and walking 3x/week. Lost 18lbs in last 16month Current use of rybelsus Wt Readings from Last 3 Encounters:  02/10/23 (!) 423 lb 6.4 oz (192.1 kg)  01/06/23 (!) 441 lb (200 kg)  05/06/22 (!) 411 lb (186.4 kg)    She agreed to bariatric clinic referral Encouraged to maintain daily exercise F/up 25months  Bronchiectasis without complication (HCC) Under the care of pulmonology, Trach in place and oxygen dependent-2l Denies any respiratory distress today, no cough or mucus production  BP Readings from Last 3 Encounters:  02/10/23 126/80  01/06/23 (!) 140/90  05/06/22 (!) 154/96       01/06/2023   10:42 AM 03/31/2022   10:09 AM 03/31/2022   10:08 AM  Depression screen PHQ 2/9  Decreased Interest 1 0 0  Down, Depressed, Hopeless 0 0 0  PHQ - 2 Score 1 0 0  Altered sleeping 0    Tired, decreased energy 1    Change in appetite 0    Feeling bad or failure about yourself  0    Trouble concentrating 0    Moving slowly or fidgety/restless 0    Suicidal thoughts 0    PHQ-9 Score 2    Difficult doing work/chores Not difficult at all         01/06/2023   10:43 AM 11/26/2020   11:38 AM 03/13/2020   11:41 AM  09/24/2016    1:40 PM  GAD 7 : Generalized Anxiety Score  Nervous, Anxious, on Edge 1 0 0 0  Control/stop worrying 0 0 0 0  Worry too much - different things 0 0 0 0  Trouble relaxing 0 0 0 0  Restless 0 0 0 0  Easily annoyed or irritable 0 0 0 0  Afraid - awful might happen 0 0 0 0  Total GAD 7 Score 1 0 0 0  Anxiety Difficulty Not difficult at all         Reviewed medical, surgical, and social history today  Medications: Outpatient Medications Prior to Visit  Medication Sig   amLODipine (NORVASC) 5 MG tablet TAKE 1 TABLET (5 MG TOTAL) BY MOUTH DAILY.   atorvastatin (LIPITOR) 20 MG tablet TAKE 1 TABLET BY MOUTH EVERY DAY   carvedilol (COREG) 25 MG tablet Take 1 tablet (25 mg total) by mouth 2 (two) times daily.   citalopram (CELEXA) 10 MG tablet TAKE 1 TABLET BY MOUTH EVERY DAY   furosemide (LASIX) 40 MG tablet Take 1 tablet (40 mg total) by mouth daily.   hydrALAZINE (APRESOLINE) 100 MG tablet Take 1 tablet (100 mg total) by mouth 3 (three) times daily.   isosorbide dinitrate (ISORDIL) 30 MG  tablet TAKE 1 TABLET BY MOUTH 3 (THREE) TIMES DAILY.   Semaglutide (RYBELSUS) 3 MG TABS Take 1 tablet (3 mg total) by mouth daily.   [DISCONTINUED] cephALEXin (KEFLEX) 500 MG capsule Take 1 capsule (500 mg total) by mouth 3 (three) times daily. (Patient not taking: Reported on 02/10/2023)   No facility-administered medications prior to visit.   Reviewed past medical and social history.   ROS per HPI above      Objective:  BP 126/80 (BP Location: Left Wrist, Patient Position: Sitting, Cuff Size: Large)   Pulse 85   Temp 98.1 F (36.7 C) (Oral)   Resp 18   Ht 5\' 5"  (1.651 m)   Wt (!) 423 lb 6.4 oz (192.1 kg)   SpO2 94% Comment: 3 L of 02  BMI 70.46 kg/m      Physical Exam Vitals reviewed.  Constitutional:      General: She is not in acute distress.    Appearance: She is obese.  Cardiovascular:     Rate and Rhythm: Normal rate.     Pulses: Normal pulses.  Pulmonary:      Effort: Pulmonary effort is normal.  Musculoskeletal:     Right lower leg: Edema present.     Left lower leg: Edema present.  Skin:    Comments: Left LE wrapped with compression dressing  Neurological:     Mental Status: She is alert and oriented to person, place, and time.  Psychiatric:        Mood and Affect: Mood normal.        Behavior: Behavior normal.        Thought Content: Thought content normal.     No results found for any visits on 02/10/23.    Assessment & Plan:    Problem List Items Addressed This Visit       Respiratory   Bronchiectasis without complication (HCC) - Primary    Under the care of pulmonology, Trach in place and oxygen dependent-2l Denies any respiratory distress today, no cough or mucus production        Endocrine   DM (diabetes mellitus) (HCC)    No adverse effect with rybelsus mg No glucose check at home. Last hgbA1c at % LDL at goal with atorvastatin Normal foot exam except long nails. She declined referral to podiatry at this time. Agreed to schedule Dm eye exam with mobile clinic.  Maintain med dose Collect UACr today F/up in 65month      Relevant Orders   Urine microalbumin-creatinine with uACR   Amb Referral to Bariatric Surgery     Other   Morbid obesity (HCC)    Reports diet modification: high protein and fiber diet Had previous appointment with nutritionist. Declined need for another appt Exercise: home video and walking 3x/week. Lost 18lbs in last 43month Current use of rybelsus Wt Readings from Last 3 Encounters:  02/10/23 (!) 423 lb 6.4 oz (192.1 kg)  01/06/23 (!) 441 lb (200 kg)  05/06/22 (!) 411 lb (186.4 kg)    She agreed to bariatric clinic referral Encouraged to maintain daily exercise F/up 65months      Relevant Orders   Amb Referral to Bariatric Surgery   Other Visit Diagnoses     Long term (current) use of oral hypoglycemic drugs       Relevant Orders   Urine microalbumin-creatinine with uACR   Amb  Referral to Bariatric Surgery     Advised to schedule appointment with GYN for repeat PAP.  Return  in about 3 months (around 05/13/2023) for HTN, DM, hyperlipidemia (fasting).     Alysia Penna, NP

## 2023-02-10 NOTE — Patient Instructions (Addendum)
Go to lab Maintain current med doses Schedule appointment with GYN Schedule appointment for Dm eye exam

## 2023-02-10 NOTE — Telephone Encounter (Signed)
Message sent to [name] [name] to get pt scheduled on 06/09/23 here at [facility].

## 2023-02-10 NOTE — Assessment & Plan Note (Signed)
Reports diet modification: high protein and fiber diet Had previous appointment with nutritionist. Declined need for another appt Exercise: home video and walking 3x/week. Lost 18lbs in last 69month Current use of rybelsus Wt Readings from Last 3 Encounters:  02/10/23 (!) 423 lb 6.4 oz (192.1 kg)  01/06/23 (!) 441 lb (200 kg)  05/06/22 (!) 411 lb (186.4 kg)    She agreed to bariatric clinic referral Encouraged to maintain daily exercise F/up 3months

## 2023-02-10 NOTE — Assessment & Plan Note (Signed)
Under the care of pulmonology, Trach in place and oxygen dependent-2l Denies any respiratory distress today, no cough or mucus production

## 2023-02-11 MED ORDER — LISINOPRIL 2.5 MG PO TABS
2.5000 mg | ORAL_TABLET | Freq: Every day | ORAL | 1 refills | Status: DC
Start: 2023-02-11 — End: 2023-08-08

## 2023-02-11 NOTE — Progress Notes (Signed)
JOCIE, MERONEY ([id number]) [id number] Page 1 of 8 Visit Report for 02/10/2023 Arrival Information Details Patient Name: Date of Service: [name] [name], TIFFA [place] S. 02/10/2023 8:45 A M Medical Record Number: [id number] Patient Account Number: [id number] Date of Birth/Sex: Treating RN: 05/15/1981 (42 y.o. Gevena Mart Primary Care Maccoy Haubner: Alysia Penna Other Clinician: Referring Irvine Glorioso: Treating Khale Nigh/Extender: Sherlynn Stalls in Treatment: 2 Visit Information History Since Last Visit All ordered tests and consults were completed: Yes Patient Arrived: Ambulatory Added or deleted any medications: No Arrival Time: 08:57 Any new allergies or adverse reactions: No Accompanied By: self Had a fall or experienced change in No Transfer Assistance: None activities of daily living that may affect Patient Identification Verified: Yes risk of falls: Secondary Verification Process Completed: Yes Signs or symptoms of abuse/neglect since last visito No Patient Requires Transmission-Based Precautions: No Hospitalized since last visit: No Patient Has Alerts: No Implantable device outside of the clinic excluding No cellular tissue based products placed in the center since last visit: Has Dressing in Place as Prescribed: Yes Pain Present Now: No Electronic Signature(s) Signed: 02/11/2023 10:19:19 AM By: Brenton Grills Entered By: Brenton Grills on 02/10/2023 08:58:29 -------------------------------------------------------------------------------- Compression Therapy Details Patient Name: Date of Service: [name] [name], TIFFA [place] S. 02/10/2023 8:45 A M Medical Record Number: [id number] Patient Account Number: [id number] Date of Birth/Sex: Treating RN: 1981-01-25 (42 y.o. Gevena Mart Primary Care Katana Berthold: Alysia Penna Other Clinician: Referring Tyrees Chopin: Treating Fatim Vanderschaaf/Extender: Sherlynn Stalls in Treatment:  2 Compression Therapy Performed for Wound Assessment: Wound #1 Left,Circumferential Lower Leg Performed By: Clinician Brenton Grills, RN Compression Type: Four Layer Post Procedure Diagnosis Same as Pre-procedure Electronic Signature(s) Signed: 02/11/2023 10:19:19 AM By: Brenton Grills Entered By: Brenton Grills on 02/10/2023 10:19:57 [place], [name] ([id number]) [id number] Page 2 of 8 -------------------------------------------------------------------------------- Encounter Discharge Information Details Patient Name: Date of Service: [name] [name], TIFFA [place] S. 02/10/2023 8:45 A M Medical Record Number: [id number] Patient Account Number: [id number] Date of Birth/Sex: Treating RN: 04-Dec-1980 (42 y.o. Gevena Mart Primary Care Jovin Fester: Alysia Penna Other Clinician: Referring Sravya Grissom: Treating Naylah Cork/Extender: Sherlynn Stalls in Treatment: 2 Encounter Discharge Information Items Post Procedure Vitals Discharge Condition: Stable Temperature (F): 98.2 Ambulatory Status: Ambulatory Pulse (bpm): 84 Discharge Destination: Home Respiratory Rate (breaths/min): 18 Transportation: Private Auto Blood Pressure (mmHg): 138/72 Accompanied By: self Schedule Follow-up Appointment: Yes Clinical Summary of Care: Patient Declined Electronic Signature(s) Signed: 02/11/2023 10:19:19 AM By: Brenton Grills Entered By: Brenton Grills on 02/10/2023 10:02:48 -------------------------------------------------------------------------------- Lower Extremity Assessment Details Patient Name: Date of Service: [name] [name], TIFFA [place] S. 02/10/2023 8:45 A M Medical Record Number: [id number] Patient Account Number: [id number] Date of Birth/Sex: Treating RN: March 28, 1981 (42 y.o. Gevena Mart Primary Care Dominiqua Cooner: Alysia Penna Other Clinician: Referring Marien Manship: Treating Zhane Donlan/Extender: Sherlynn Stalls in Treatment: 2 Edema  Assessment Assessed: [Left: No] [Right: No] Edema: [Left: Ye] [Right: s] Calf Left: Right: Point of Measurement: 32 cm From Medial Instep 64 cm Ankle Left: Right: Point of Measurement: 11 cm From Medial Instep 30.5 cm Vascular Assessment Pulses: Dorsalis Pedis Palpable: [Left:Yes] Electronic Signature(s) Signed: 02/11/2023 10:19:19 AM By: Brenton Grills Entered By: Brenton Grills on 02/10/2023 09:21:51 Multi Wound Chart Details -------------------------------------------------------------------------------- Clovia Cuff ([id number]) [id number] Page 3 of 8 Patient Name: Date of Service: [name] [name], TIFFA [place] S. 02/10/2023 8:45 A M Medical Record Number: [id number] Patient Account Number: [id number] Date of Birth/Sex: Treating RN: 11/23/1980 (42 y.o. F)  Primary Care Del Overfelt: Alysia Penna Other Clinician: Referring Yajayra Feldt: Treating Kordel Leavy/Extender: Sherlynn Stalls in Treatment: 2 Vital Signs Height(in): 65 Pulse(bpm): 87 Weight(lbs): 430 Blood Pressure(mmHg): 130/78 Body Mass Index(BMI): 71.5 Temperature(F): 98.1 Respiratory Rate(breaths/min): 18 Wound Assessments Wound Number: 1 N/A N/A Photos: N/A N/A Left, Circumferential Lower Leg N/A N/A Wound Location: Gradually Appeared N/A N/A Wounding Event: Venous Leg Ulcer N/A N/A Primary Etiology: Lymphedema, Sleep Apnea, N/A N/A Comorbid History: Hypertension, Peripheral Venous Disease, Type II Diabetes 01/25/2023 N/A N/A Date Acquired: 2 N/A N/A Weeks of Treatment: Open N/A N/A Wound Status: No N/A N/A Wound Recurrence: 11.3x25.6x0.1 N/A N/A Measurements L x W x D (cm) 227.2 N/A N/A A (cm) : rea 22.72 N/A N/A Volume (cm) : 13.30% N/A N/A % Reduction in Area: 13.30% N/A N/A % Reduction in Volume: Full Thickness Without Exposed N/A N/A Classification: Support Structures Large N/A N/A Exudate Amount: Serous N/A N/A Exudate Type: amber N/A  N/A Exudate Color: Yes N/A N/A Foul Odor A Cleansing: fter No N/A N/A Odor Anticipated Due to Product Use: Distinct, outline attached N/A N/A Wound Margin: Small (1-33%) N/A N/A Granulation A mount: Red, Pink N/A N/A Granulation Quality: Large (67-100%) N/A N/A Necrotic Amount: Fat Layer (Subcutaneous Tissue): Yes N/A N/A Exposed Structures: Fascia: No Tendon: No Muscle: No Joint: No Bone: No None N/A N/A Epithelialization: Debridement - Selective/Open Wound N/A N/A Debridement: Pre-procedure Verification/Time Out 09:35 N/A N/A Taken: Lidocaine 5% topical ointment N/A N/A Pain Control: Slough N/A N/A Tissue Debrided: Non-Viable Tissue N/A N/A Level: 227.08 N/A N/A Debridement A (sq cm): rea Curette N/A N/A Instrument: Moderate N/A N/A Bleeding: Pressure N/A N/A Hemostasis A chieved: 0 N/A N/A Procedural Pain: 0 N/A N/A Post Procedural Pain: Procedure was tolerated well N/A N/A Debridement Treatment Response: 11.3x25.6x0.1 N/A N/A Post Debridement Measurements L x W x D (cm) 22.72 N/A N/A Post Debridement Volume: (cm) No Abnormalities Noted N/A N/A Periwound Skin Texture: Maceration: Yes N/A N/A Periwound Skin Moisture: Hemosiderin Staining: Yes N/A N/A Periwound Skin Color: No Abnormality N/A N/A Temperature: Debridement N/A N/A Procedures Performed: IREENE, BALLOWE ([id number]) [id number] Page 4 of 8 Treatment Notes Wound #1 (Lower Leg) Wound Laterality: Left, Circumferential Cleanser Soap and Water Discharge Instruction: May shower and wash wound with dial antibacterial soap and water prior to dressing change. Vashe 5.8 (oz) Discharge Instruction: Cleanse the wound with Vashe prior to applying a clean dressing using gauze sponges, not tissue or cotton balls. Peri-Wound Care Sween Lotion (Moisturizing lotion) Discharge Instruction: Apply moisturizing lotion as directed Topical Gentamicin Discharge Instruction:  As directed by physician Mupirocin Ointment Discharge Instruction: Apply Mupirocin (Bactroban) as instructed Ketoconazole Cream 2% Discharge Instruction: Apply Ketoconazole as directed Primary Dressing Maxorb Extra Ag+ Alginate Dressing, 4x4.75 (in/in) Discharge Instruction: Apply to wound bed as instructed Secondary Dressing ABD Pad, 8x10 Discharge Instruction: Apply over primary dressing as directed. Secured With Compression Wrap Urgo K2, (equivalent to a 4 layer) two layer compression system, regular Discharge Instruction: Apply Urgo K2 as directed (alternative to 4 layer compression). CoFlex Zinc Unna Boot, 4 x 6 (in/yd) Discharge Instruction: Apply Coflex Zinc Unna Boot to top of wrap . Compression Stockings Add-Ons Electronic Signature(s) Signed: 02/10/2023 10:05:43 AM By: Duanne Guess MD FACS Entered By: Duanne Guess on 02/10/2023 10:05:43 -------------------------------------------------------------------------------- Multi-Disciplinary Care Plan Details Patient Name: Date of Service: [name] [name], TIFFA [place] S. 02/10/2023 8:45 A M Medical Record Number: [id number] Patient Account Number: [id number] Date of Birth/Sex: Treating RN: 04-Sep-1980 (42 y.o. Gerilyn Nestle,  Lupita Leash Primary Care Goble Fudala: Alysia Penna Other Clinician: Referring Edinson Domeier: Treating Collier Monica/Extender: Sherlynn Stalls in Treatment: 2 Active Inactive Orientation to the Wound Care Program Nursing Diagnoses: Knowledge deficit related to the wound healing center program TEIA, FREITAS ([id number]) [id number] Page 5 of 8 Goals: Patient/caregiver will verbalize understanding of the Wound Healing Center Program Date Initiated: 01/25/2023 Target Resolution Date: 02/10/2023 Goal Status: Active Interventions: Provide education on orientation to the wound center Notes: Venous Leg Ulcer Nursing Diagnoses: Actual venous Insuffiency (use after diagnosis is  confirmed) Knowledge deficit related to disease process and management Goals: Patient will maintain optimal edema control Date Initiated: 01/25/2023 Target Resolution Date: 03/23/2023 Goal Status: Active Patient/caregiver will verbalize understanding of disease process and disease management Date Initiated: 01/25/2023 Target Resolution Date: 03/23/2023 Goal Status: Active Interventions: Assess peripheral edema status every visit. Compression as ordered Provide education on venous insufficiency Notes: Wound/Skin Impairment Nursing Diagnoses: Impaired tissue integrity Knowledge deficit related to ulceration/compromised skin integrity Goals: Patient/caregiver will verbalize understanding of skin care regimen Date Initiated: 01/25/2023 Target Resolution Date: 03/30/2023 Goal Status: Active Ulcer/skin breakdown will have a volume reduction of 30% by week 4 Date Initiated: 01/25/2023 Target Resolution Date: 02/22/2023 Goal Status: Active Interventions: Assess patient/caregiver ability to obtain necessary supplies Assess patient/caregiver ability to perform ulcer/skin care regimen upon admission and as needed Assess ulceration(s) every visit Provide education on ulcer and skin care Notes: Electronic Signature(s) Signed: 02/11/2023 10:19:19 AM By: Brenton Grills Entered By: Brenton Grills on 02/10/2023 09:03:38 -------------------------------------------------------------------------------- Pain Assessment Details Patient Name: Date of Service: [name] [name], TIFFA [place] S. 02/10/2023 8:45 A M Medical Record Number: [id number] Patient Account Number: [id number] Date of Birth/Sex: Treating RN: 1981-03-12 (42 y.o. Gevena Mart Primary Care Arlys Scatena: Alysia Penna Other Clinician: Referring Toyia Jelinek: Treating Kamen Hanken/Extender: Sherlynn Stalls in Treatment: 2 Active Problems Piscitello, [name] ([id number]) [id number] Page 6 of 8 Location of Pain  Severity and Description of Pain Patient Has Paino No Site Locations Pain Management and Medication Current Pain Management: Electronic Signature(s) Signed: 02/11/2023 10:19:19 AM By: Brenton Grills Entered By: Brenton Grills on 02/10/2023 08:59:11 -------------------------------------------------------------------------------- Patient/Caregiver Education Details Patient Name: Date of Service: [name], TIFFA [place] Kathie Rhodes 6/20/2024andnbsp8:45 A M Medical Record Number: [id number] Patient Account Number: [id number] Date of Birth/Gender: Treating RN: Jan 17, 1981 (42 y.o. Gevena Mart Primary Care Physician: Alysia Penna Other Clinician: Referring Physician: Treating Physician/Extender: Sherlynn Stalls in Treatment: 2 Education Assessment Education Provided To: Patient Education Topics Provided Wound/Skin Impairment: Methods: Explain/Verbal Responses: State content correctly Electronic Signature(s) Signed: 02/11/2023 10:19:19 AM By: Brenton Grills Entered By: Brenton Grills on 02/10/2023 09:04:03 -------------------------------------------------------------------------------- Wound Assessment Details Patient Name: Date of Service: [name] [name], TIFFA [place] S. 02/10/2023 8:45 A Burnell Blanks, [name] ([id number]) [id number] Page 7 of 8 Medical Record Number: [id number] Patient Account Number: [id number] Date of Birth/Sex: Treating RN: [birth year] (42 y.o. Gevena Mart Primary Care Savannha Welle: Alysia Penna Other Clinician: Referring Kanasia Gayman: Treating Elva Mauro/Extender: Sherlynn Stalls in Treatment: 2 Wound Status Wound Number: 1 Primary Venous Leg Ulcer Etiology: Wound Location: Left, Circumferential Lower Leg Wound Open Wounding Event: Gradually Appeared Status: Date Acquired: 01/25/2023 Comorbid Lymphedema, Sleep Apnea, Hypertension, Peripheral Venous Weeks Of Treatment: 2 History: Disease, Type II  Diabetes Clustered Wound: No Photos Wound Measurements Length: (cm) 11.3 Width: (cm) 25.6 Depth: (cm) 0.1 Area: (cm) 227.2 Volume: (cm) 22.72 % Reduction in Area: 13.3% % Reduction in Volume: 13.3% Epithelialization: None Tunneling: No Undermining: No Wound  Description Classification: Full Thickness Without Exposed Support Structures Wound Margin: Distinct, outline attached Exudate Amount: Large Exudate Type: Serous Exudate Color: amber Foul Odor After Cleansing: Yes Due to Product Use: No Slough/Fibrino Yes Wound Bed Granulation Amount: Small (1-33%) Exposed Structure Granulation Quality: Red, Pink Fascia Exposed: No Necrotic Amount: Large (67-100%) Fat Layer (Subcutaneous Tissue) Exposed: Yes Necrotic Quality: Adherent Slough Tendon Exposed: No Muscle Exposed: No Joint Exposed: No Bone Exposed: No Periwound Skin Texture Texture Color No Abnormalities Noted: Yes No Abnormalities Noted: No Hemosiderin Staining: Yes Moisture No Abnormalities Noted: No Temperature / Pain Maceration: Yes Temperature: No Abnormality Treatment Notes Wound #1 (Lower Leg) Wound Laterality: Left, Circumferential Cleanser Soap and Water Discharge Instruction: May shower and wash wound with dial antibacterial soap and water prior to dressing change. Vashe 5.8 (oz) Discharge Instruction: Cleanse the wound with Vashe prior to applying a clean dressing using gauze sponges, not tissue or cotton balls. Peri-Wound Care Sween Lotion (Moisturizing lotion) Discharge Instruction: Apply moisturizing lotion as directed NAYDA, RIESEN ([id number]) [id number] Page 8 of 8 Topical Gentamicin Discharge Instruction: As directed by physician Mupirocin Ointment Discharge Instruction: Apply Mupirocin (Bactroban) as instructed Ketoconazole Cream 2% Discharge Instruction: Apply Ketoconazole as directed Primary Dressing Maxorb Extra Ag+ Alginate Dressing, 4x4.75  (in/in) Discharge Instruction: Apply to wound bed as instructed Secondary Dressing ABD Pad, 8x10 Discharge Instruction: Apply over primary dressing as directed. Secured With Compression Wrap Urgo K2, (equivalent to a 4 layer) two layer compression system, regular Discharge Instruction: Apply Urgo K2 as directed (alternative to 4 layer compression). CoFlex Zinc Unna Boot, 4 x 6 (in/yd) Discharge Instruction: Apply Coflex Zinc Unna Boot to top of wrap . Compression Stockings Add-Ons Electronic Signature(s) Signed: 02/11/2023 10:19:19 AM By: Brenton Grills Entered By: Brenton Grills on 02/10/2023 09:39:48 -------------------------------------------------------------------------------- Vitals Details Patient Name: Date of Service: [name], TIFFA [place] S. 02/10/2023 8:45 A M Medical Record Number: [id number] Patient Account Number: [id number] Date of Birth/Sex: Treating RN: 12-22-1980 (42 y.o. Gevena Mart Primary Care Afomia Blackley: Alysia Penna Other Clinician: Referring Jacyln Carmer: Treating Cherissa Hook/Extender: Sherlynn Stalls in Treatment: 2 Vital Signs Time Taken: 08:55 Temperature (F): 98.1 Height (in): 65 Pulse (bpm): 87 Weight (lbs): 430 Respiratory Rate (breaths/min): 18 Body Mass Index (BMI): 71.5 Blood Pressure (mmHg): 130/78 Reference Range: 80 - 120 mg / dl Electronic Signature(s) Signed: 02/11/2023 10:19:19 AM By: Brenton Grills Entered By: Brenton Grills on 02/10/2023 08:59:03

## 2023-02-11 NOTE — Addendum Note (Signed)
Addended by: Alysia Penna L on: 02/11/2023 01:17 PM   Modules accepted: Orders

## 2023-02-11 NOTE — Progress Notes (Signed)
DARIEN, MIGNOGNA ([id number]) [id number] Page 1 of 9 Visit Report for 02/10/2023 Chief Complaint Document Details Patient Name: Date of Service: [name] [name], [name] [name] [place] S. 02/10/2023 8:45 A M Medical Record Number: [id number] Patient Account Number: [id number] Date of Birth/Sex: Treating RN: Mar 28, 1981 (42 y.o. F) Primary Care Provider: Alysia Penna Other Clinician: Referring Provider: Treating Provider/Extender: Sherlynn Stalls in Treatment: 2 Information Obtained from: Patient Chief Complaint Patient presents for treatment of an open ulcer due to venous insufficiency in the setting of severe stage 3 lymphedema Electronic Signature(s) Signed: 02/10/2023 10:05:56 AM By: Duanne Guess MD FACS Entered By: Duanne Guess on 02/10/2023 10:05:56 -------------------------------------------------------------------------------- Debridement Details Patient Name: Date of Service: [name] [name], [name] [name] [place] S. 02/10/2023 8:45 A M Medical Record Number: [id number] Patient Account Number: [id number] Date of Birth/Sex: Treating RN: 06-18-1981 (42 y.o. Gevena Mart Primary Care Provider: Alysia Penna Other Clinician: Referring Provider: Treating Provider/Extender: Sherlynn Stalls in Treatment: 2 Debridement Performed for Assessment: Wound #1 Left,Circumferential Lower Leg Performed By: Physician Duanne Guess, MD Debridement Type: Debridement Severity of Tissue Pre Debridement: Fat layer exposed Level of Consciousness (Pre-procedure): Awake and Alert Pre-procedure Verification/Time Out Yes - 09:35 Taken: Start Time: 09:36 Pain Control: Lidocaine 5% topical ointment Percent of Wound Bed Debrided: 100% T Area Debrided (cm): otal 227.08 Tissue and other material debrided: Viable, Non-Viable, Slough, Slough Level: Non-Viable Tissue Debridement Description: Selective/Open Wound Instrument: Curette Bleeding:  Moderate Hemostasis Achieved: Pressure End Time: 09:40 Procedural Pain: 0 Post Procedural Pain: 0 Response to Treatment: Procedure was tolerated well Level of Consciousness (Post- Awake and Alert procedure): Post Debridement Measurements of Total Wound Length: (cm) 11.3 Width: (cm) 25.6 Depth: (cm) 0.1 Volume: (cm) 22.72 Character of Wound/Ulcer Post Debridement: Requires Further Debridement [name], [name] ([id number]) [id number] Page 2 of 9 Severity of Tissue Post Debridement: Fat layer exposed Post Procedure Diagnosis Same as Pre-procedure Notes Scribed for Dr Lady Gary by Brenton Grills RN. Electronic Signature(s) Signed: 02/10/2023 11:31:33 AM By: Duanne Guess MD FACS Signed: 02/11/2023 10:19:19 AM By: Brenton Grills Entered By: Brenton Grills on 02/10/2023 09:41:43 -------------------------------------------------------------------------------- HPI Details Patient Name: Date of Service: [name] [name], [name] [name] [place] S. 02/10/2023 8:45 A M Medical Record Number: [id number] Patient Account Number: [id number] Date of Birth/Sex: Treating RN: Oct 27, 1980 (42 y.o. F) Primary Care Provider: Alysia Penna Other Clinician: Referring Provider: Treating Provider/Extender: Sherlynn Stalls in Treatment: 2 History of Present Illness HPI Description: ADMISSION 01/25/2023 This [name] a 42 year old type II diabetic (last hemoglobin A1c 6.9%) who [name] super morbidly obese and relies on a tracheostomy as well as oxygen via nasal cannula during the day. She has congestive heart failure and the skin changes in her legs are consistent with lymphedema. She was referred to [place] for venous stasis dermatitis however she has circumferential breakdown of the skin on her left leg. There [name] thick slough formation. ABIs were noncompressible, secondary to the lymphedema, but she does have a palpable dorsalis pedis pulse. 02/01/2023: The wound [name] covered with a thick layer  of yellowish-gray slimy slough and has a strong putrid odor. 02/10/2023: The wound [name] less malodorous today and has less slime on the surface, but there [name] still slough present and a persistent odor. The culture that I took last week was polymicrobial with predominant species including Morganella and a plethora of skin flora. Both Augmentin and Bactrim were recommended as treatment in combination and these were prescribed. Unfortunately, she [name] unable to afford [facility] topical antibiotic compound.  She [name] tolerating her oral therapy well, however. Electronic Signature(s) Signed: 02/10/2023 10:07:51 AM By: Duanne Guess MD FACS Entered By: Duanne Guess on 02/10/2023 10:07:51 -------------------------------------------------------------------------------- Physical Exam Details Patient Name: Date of Service: [name] [name], [name] [name] [place] S. 02/10/2023 8:45 A M Medical Record Number: [id number] Patient Account Number: [id number] Date of Birth/Sex: Treating RN: Jan 04, 1981 (42 y.o. F) Primary Care Provider: Alysia Penna Other Clinician: Referring Provider: Treating Provider/Extender: Sherlynn Stalls in Treatment: 2 Constitutional . . . . no acute distress. Respiratory Normal work of breathing on supplemental oxygen. [name], [name] ([id number]) [id number] Page 3 of 9 Notes 02/10/2023: The wound [name] less malodorous today and has less slime on the surface, but there [name] still slough present and a persistent odor. Electronic Signature(s) Signed: 02/10/2023 10:09:13 AM By: Duanne Guess MD FACS Previous Signature: 02/10/2023 10:08:22 AM Version By: Duanne Guess MD FACS Entered By: Duanne Guess on 02/10/2023 10:09:13 -------------------------------------------------------------------------------- Physician Orders Details Patient Name: Date of Service: [name] [name], [name] [name] [place] S. 02/10/2023 8:45 A M Medical Record Number: [id number] Patient Account  Number: [id number] Date of Birth/Sex: Treating RN: 02-21-1981 (42 y.o. Gevena Mart Primary Care Provider: Alysia Penna Other Clinician: Referring Provider: Treating Provider/Extender: Sherlynn Stalls in Treatment: 2 Verbal / Phone Orders: No Diagnosis Coding Follow-up Appointments ppointment in 1 week. - Dr Lady Gary - room 1 Return A Anesthetic Wound #1 Left,Circumferential Lower Leg (In clinic) Topical Lidocaine 4% applied to wound bed Bathing/ Shower/ Hygiene May shower with protection but do not get wound dressing(s) wet. Protect dressing(s) with water repellant cover (for example, large plastic bag) or a cast cover and may then take shower. Edema Control - Lymphedema / SCD / Other Bilateral Lower Extremities Elevate legs to the level of the heart or above for 30 minutes daily and/or when sitting for 3-4 times a day throughout the day. Avoid standing for long periods of time. Wound Treatment Wound #1 - Lower Leg Wound Laterality: Left, Circumferential Cleanser: Soap and Water 1 x Per Week/30 Days Discharge Instructions: May shower and wash wound with dial antibacterial soap and water prior to dressing change. Cleanser: Vashe 5.8 (oz) 1 x Per Week/30 Days Discharge Instructions: Cleanse the wound with Vashe prior to applying a clean dressing using gauze sponges, not tissue or cotton balls. Peri-Wound Care: Sween Lotion (Moisturizing lotion) 1 x Per Week/30 Days Discharge Instructions: Apply moisturizing lotion as directed Topical: Gentamicin 1 x Per Week/30 Days Discharge Instructions: As directed by physician Topical: Mupirocin Ointment 1 x Per Week/30 Days Discharge Instructions: Apply Mupirocin (Bactroban) as instructed Topical: Ketoconazole Cream 2% 1 x Per Week/30 Days Discharge Instructions: Apply Ketoconazole as directed Prim Dressing: Maxorb Extra Ag+ Alginate Dressing, 4x4.75 (in/in) 1 x Per Week/30 Days ary Discharge Instructions: Apply  to wound bed as instructed Secondary Dressing: ABD Pad, 8x10 1 x Per Week/30 Days Discharge Instructions: Apply over primary dressing as directed. Compression Wrap: Urgo K2, (equivalent to a 4 layer) two layer compression system, regular 1 x Per Week/30 Days Discharge Instructions: Apply Urgo K2 as directed (alternative to 4 layer compression). Compression Wrap: CoFlex Zinc Unna Boot, 4 x 6 (in/yd) 1 x Per Week/30 Days Discharge Instructions: Apply Coflex Zinc Unna Boot to top of wrap . [name], [name] ([id number]) [id number] Page 4 of 9 Electronic Signature(s) Signed: 02/10/2023 11:31:33 AM By: Duanne Guess MD FACS Entered By: Duanne Guess on 02/10/2023 10:09:20 -------------------------------------------------------------------------------- Problem List Details Patient Name: Date of Service: [name] [name], [name] [name] [place]  S. 02/10/2023 8:45 A M Medical Record Number: [id number] Patient Account Number: [id number] Date of Birth/Sex: Treating RN: 10-21-1980 (42 y.o. 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/11/2022 No Yes E11.622 Type 2 diabetes mellitus with other skin ulcer 01/25/2023 No Yes I89.0 Lymphedema, not elsewhere classified 01/25/2023 No Yes I87.2 Venous insufficiency (chronic) (peripheral) 01/25/2023 No Yes I50.32 Chronic diastolic (congestive) heart failure 01/25/2023 No Yes E66.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 01/25/2023 No Yes Z93.0 Tracheostomy status 01/25/2023 No Yes Z99.81 Dependence on supplemental oxygen 01/25/2023 No Yes Inactive Problems Resolved Problems Electronic Signature(s) Signed: 02/10/2023 10:05:34 AM By: Duanne Guess MD FACS Entered  By: Duanne Guess on 02/10/2023 10:05:33 Ridener, [name] ([id number]) [id number] Page 5 of 9 -------------------------------------------------------------------------------- Progress Note Details Patient Name: Date of Service: [name] [name], [name] [name] [place] S. 02/10/2023 8:45 A M Medical Record Number: [id number] Patient Account Number: [id number] Date of Birth/Sex: Treating RN: 05-18-1981 (42 y.o. F) Primary Care Provider: Alysia Penna Other Clinician: Referring Provider: Treating Provider/Extender: Sherlynn Stalls in Treatment: 2 Subjective Chief Complaint Information obtained from Patient Patient presents for treatment of an open ulcer due to venous insufficiency in the setting of severe stage 3 lymphedema History of Present Illness (HPI) ADMISSION 01/25/2023 This [name] a 42 year old type II diabetic (last hemoglobin A1c 6.9%) who [name] super morbidly obese and relies on a tracheostomy as well as oxygen via nasal cannula during the day. She has congestive heart failure and the skin changes in her legs are consistent with lymphedema. She was referred to [place] for venous stasis dermatitis however she has circumferential breakdown of the skin on her left leg. There [name] thick slough formation. ABIs were noncompressible, secondary to the lymphedema, but she does have a palpable dorsalis pedis pulse. 02/01/2023: The wound [name] covered with a thick layer of yellowish-gray slimy slough and has a strong putrid odor. 02/10/2023: The wound [name] less malodorous today and has less slime on the surface, but there [name] still slough present and a persistent odor. The culture that I took last week was polymicrobial with predominant species including Morganella and a plethora of skin flora. Both Augmentin and Bactrim were recommended as treatment in combination and these were prescribed. Unfortunately, she [name] unable to afford [facility] topical antibiotic compound. She [name] tolerating  her oral therapy well, however. Patient History Information obtained from Patient. Family History Cancer - Paternal Grandparents, Diabetes - Mother, Heart Disease - Maternal Grandparents, Hypertension - Mother, No family history of Kidney Disease, Lung Disease, Seizures, Stroke, Thyroid Problems, Tuberculosis. Social History Never smoker, Marital Status - Single, Alcohol Use - Never, Drug Use - No History, Caffeine Use - Never. Medical History Hematologic/Lymphatic Patient has history of Lymphedema Respiratory Patient has history of Sleep Apnea Cardiovascular Patient has history of Hypertension, Peripheral Venous Disease Endocrine Patient has history of Type II Diabetes Integumentary (Skin) Denies history of History of Burn Hospitalization/Surgery History - tracheostomy 2017. Medical A Surgical History Notes nd Respiratory chronic respiratory failure tracheostomy dependent Cardiovascular diastolic heart failure pericardial effusion pulmonary hypertension Gastrointestinal hepatic congestion dysphagia Endocrine hyperlipidemia, morbid obesity Objective Constitutional no acute distress. [name], [name] ([id number]) [id number] Page 6 of 9 Vitals Time Taken: 8:55 AM, Height: 65 in, Weight: 430 lbs, BMI: 71.5, Temperature: 98.1 F, Pulse: 87 bpm, Respiratory Rate:  18 breaths/min, Blood Pressure: 130/78 mmHg. Respiratory Normal work of breathing on supplemental oxygen. General Notes: 02/10/2023: The wound [name] less malodorous today and has less slime on the surface, but there [name] still slough present and a persistent odor. Integumentary (Hair, Skin) Wound #1 status [name] Open. Original cause of wound was Gradually Appeared. The date acquired was: 01/25/2023. The wound has been in treatment 2 weeks. The wound [name] located on the Left,Circumferential Lower Leg. The wound measures 11.3cm length x 25.6cm width x 0.1cm depth; 227.2cm^2 area and 22.72cm^3 volume. There  [name] Fat Layer (Subcutaneous Tissue) exposed. There [name] no tunneling or undermining noted. There [name] a large amount of serous drainage noted. Foul odor after cleansing was noted. The wound margin [name] distinct with the outline attached to the wound base. There [name] small (1-33%) red, pink granulation within the wound bed. There [name] a large (67-100%) amount of necrotic tissue within the wound bed including Adherent Slough. The periwound skin appearance had no abnormalities noted for texture. The periwound skin appearance exhibited: Maceration, Hemosiderin Staining. Periwound temperature was noted as No Abnormality. 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 There was a Selective/Open Wound Non-Viable Tissue Debridement with a total area of 227.08 sq cm performed by Duanne Guess, MD. With the following instrument(s): Curette to remove Viable and Non-Viable tissue/material. Material removed includes [facility] after achieving pain control using Lidocaine 5% topical ointment. No specimens were taken. A time out was conducted at 09:35, prior to the start of the procedure. A Moderate amount of bleeding was controlled with Pressure. The procedure was tolerated well with a pain level of 0 throughout and a pain level of 0 following the procedure. Post Debridement Measurements: 11.3cm length x 25.6cm width x 0.1cm depth; 22.72cm^3 volume. Character of Wound/Ulcer Post Debridement  requires further debridement. Severity of Tissue Post Debridement [name]: Fat layer exposed. Post procedure Diagnosis Wound #1: Same as Pre-Procedure General Notes: Scribed for Dr Lady Gary by Brenton Grills RN.. Pre-procedure diagnosis of Wound #1 [name] a Venous Leg Ulcer located on the Left,Circumferential Lower Leg . There was a Four Layer Compression Therapy Procedure by Brenton Grills, RN. Post procedure Diagnosis Wound #1: Same as Pre-Procedure Plan Follow-up Appointments: Return Appointment in 1 week. - Dr Lady Gary - room 1 Anesthetic: Wound #1 Left,Circumferential Lower Leg: (In clinic) Topical Lidocaine 4% applied to wound bed Bathing/ Shower/ Hygiene: May shower with protection but do not get wound dressing(s) wet. Protect dressing(s) with water repellant cover (for example, large plastic bag) or a cast cover and may then take shower. Edema Control - Lymphedema / SCD / Other: Elevate legs to the level of the heart or above for 30 minutes daily and/or when sitting for 3-4 times a day throughout the day. Avoid standing for long periods of time. WOUND #1: - Lower Leg Wound Laterality: Left, Circumferential Cleanser: Soap and Water 1 x Per Week/30 Days Discharge Instructions: May shower and wash wound with dial antibacterial soap and water prior to dressing change. Cleanser: Vashe 5.8 (oz) 1 x Per Week/30 Days Discharge Instructions: Cleanse the wound with Vashe  prior to applying a clean dressing using gauze sponges, not tissue or cotton balls. Peri-Wound Care: Sween Lotion (Moisturizing lotion) 1 x Per Week/30 Days Discharge Instructions: Apply moisturizing lotion as directed Topical: Gentamicin 1 x Per Week/30 Days Discharge Instructions: As directed by physician Topical: Mupirocin Ointment 1 x Per Week/30 Days Discharge Instructions: Apply Mupirocin (Bactroban) as instructed Topical: Ketoconazole Cream 2% 1 x Per Week/30 Days [name], [name] ([id number])  [id number] Page 7 of 9 Discharge Instructions: Apply Ketoconazole as directed Prim Dressing: Maxorb Extra Ag+ Alginate Dressing, 4x4.75 (in/in) 1 x Per Week/30 Days ary Discharge Instructions: Apply to wound bed as instructed Secondary Dressing: ABD Pad, 8x10 1 x Per Week/30 Days Discharge Instructions: Apply over primary dressing as directed. Com pression Wrap: Urgo K2, (equivalent to a 4 layer) two layer compression system, regular 1 x Per Week/30 Days Discharge Instructions: Apply Urgo K2 as directed (alternative to 4 layer compression). Com pression Wrap: CoFlex Zinc Unna Boot, 4 x 6 (in/yd) 1 x Per Week/30 Days Discharge Instructions: Apply Coflex Zinc Unna Boot to top of wrap . 02/10/2023: The wound [name] less malodorous today and has less slime on the surface, but there [name] still slough present and a persistent odor. I used a curette to debride the slough from the entire wound surface. I then applied gauze moistened with Vashe and allowed it to sit for 10 minutes. We will continue to apply topical gentamicin and mupirocin with silver alginate and 4-layer compression. If there [name] no improvement, she did say she would make an effort to find a way to pay for [facility]. She will complete her course of oral antibiotics. Follow-up in 1 week. Electronic Signature(s) Signed: 02/12/2023 4:22:51 PM By: Shawn Stall RN, BSN Signed: 02/14/2023 9:35:48 AM By: Duanne Guess MD FACS Previous Signature: 02/10/2023 10:10:09 AM Version By: Duanne Guess MD FACS Entered By: Shawn Stall on 02/12/2023 16:19:01 -------------------------------------------------------------------------------- HxROS Details Patient Name: Date of Service: [name] [name], [name] [name] [place] S. 02/10/2023 8:45 A M Medical Record Number: [id number] Patient Account Number: [id number] Date of Birth/Sex: Treating RN: 1980-10-17 (42 y.o. F) Primary Care Provider: Alysia Penna Other Clinician: Referring  Provider: Treating Provider/Extender: Sherlynn Stalls in Treatment: 2 Information Obtained From Patient Hematologic/Lymphatic Medical History: Positive for: Lymphedema Respiratory Medical History: Positive for: Sleep Apnea Past Medical History Notes: chronic respiratory failure tracheostomy dependent Cardiovascular Medical History: Positive for: Hypertension; Peripheral Venous Disease Past Medical History Notes: diastolic heart failure pericardial effusion pulmonary hypertension Gastrointestinal Medical History: Past Medical History Notes: hepatic congestion dysphagia Endocrine Medical History: Positive for: Type II Diabetes Past Medical History NotesALONIA, DIBUONO ([id number]) [id number] Page 8 of 9 hyperlipidemia, morbid obesity Time with diabetes: <1 month Treated with: Oral agents Blood sugar tested every day: No Integumentary (Skin) Medical History: Negative for: History of Burn Immunizations Pneumococcal Vaccine: Received Pneumococcal Vaccination: No Implantable Devices None Hospitalization / Surgery History Type of Hospitalization/Surgery tracheostomy 2017 Family and Social History Cancer: Yes - Paternal Grandparents; Diabetes: Yes - Mother; Heart Disease: Yes - Maternal Grandparents; Hypertension: Yes - Mother; Kidney Disease: No; Lung Disease: No; Seizures: No; Stroke: No; Thyroid Problems: No; Tuberculosis: No; Never smoker; Marital Status - Single; Alcohol Use: Never; Drug Use: No History; Caffeine Use: Never; Financial Concerns: No; Food, Clothing or Shelter Needs: No; Support System Lacking: No; Transportation Concerns: No Electronic Signature(s) Signed: 02/10/2023 11:31:33 AM By: Duanne Guess MD FACS Entered By: Duanne Guess on 02/10/2023 10:07:57 -------------------------------------------------------------------------------- SuperBill Details Patient Name: Date of Service: [name] V  [name],  [name] [name] [place] S. 02/10/2023 Medical Record Number: [id number] Patient Account Number: [id number] Date of Birth/Sex: Treating RN: 03-07-1981 (42 y.o. Gevena Mart Primary Care Provider: Alysia Penna Other Clinician: Referring Provider: Treating Provider/Extender: Sherlynn Stalls in Treatment: 2 Diagnosis Coding ICD-10 Codes Code Description [phone] Non-pressure chronic ulcer of other part of left lower leg with fat layer exposed E11.622 Type 2 diabetes mellitus with other skin ulcer I89.0 Lymphedema, not elsewhere classified I87.2 Venous insufficiency (chronic) (peripheral) I50.32 Chronic diastolic (congestive) heart failure E66.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 Z93.0 Tracheostomy status Z99.81 Dependence on supplemental oxygen Facility Procedures : CPT4 Code: [id number] Description: 97597 - DEBRIDE WOUND 1ST 20 SQ CM OR < ICD-10 Diagnosis Description L97.822 Non-pressure chronic ulcer of other part of left lower leg with fat layer expose Modifier: d Quantity: 1 : Vanblarcom, TI CPT4 Code: [id number] FFANY S ([id number] ICD Description: 97598 - DEBRIDE WOUND EA ADDL 20 SQ CM ) [phone] -10 Diagnosis Description L97.822 Non-pressure chronic ulcer of other part of left lower leg with fat layer exposed Modifier: [id number] Quantity: 11 [id number] Page 9 of 9 Physician Procedures : CPT4 Code Description Modifier [phone] 99214 - WC PHYS LEVEL 4 - EST PT 25 ICD-10 Diagnosis Description L97.822 Non-pressure chronic ulcer of other part of left lower leg with fat layer exposed E11.622 Type 2 diabetes mellitus with other skin ulcer  I89.0 Lymphedema, not elsewhere classified I87.2 Venous insufficiency (chronic) (peripheral) Quantity: 1 : [id number] 97597 - WC PHYS DEBR WO ANESTH 20 SQ CM ICD-10 Diagnosis Description L97.822 Non-pressure chronic ulcer of other part of left lower leg with fat layer exposed Quantity: 1 : [id number] 97598 - WC PHYS DEBR  WO ANESTH EA ADD 20 CM ICD-10 Diagnosis Description L97.822 Non-pressure chronic ulcer of other part of left lower leg with fat layer exposed Quantity: 11 Electronic Signature(s) Signed: 02/10/2023 10:12:27 AM By: Duanne Guess MD FACS Previous Signature: 02/10/2023 10:11:55 AM Version By: Duanne Guess MD FACS Entered By: Duanne Guess on 02/10/2023 10:12:27

## 2023-02-16 ENCOUNTER — Encounter (HOSPITAL_BASED_OUTPATIENT_CLINIC_OR_DEPARTMENT_OTHER): Payer: Medicare Other | Admitting: General Surgery

## 2023-02-16 DIAGNOSIS — E11622 Type 2 diabetes mellitus with other skin ulcer: Secondary | ICD-10-CM | POA: Diagnosis not present

## 2023-02-16 NOTE — Progress Notes (Signed)
ARLETHA, MARSCHKE ([id number]) [id number] Page 1 of 7 Visit Report for 02/16/2023 Arrival Information Details Patient Name: Date of Service: [name] [name], [name] [place] S. 02/16/2023 11:30 A M Medical Record Number: [id number] Patient Account Number: [id number] Date of Birth/Sex: Treating RN: 1980/11/22 (42 y.o. [name] [name], [name] [name] Primary Care [name] [name]: [name] [name] Other Clinician: Referring [name] [name]: Treating [name] [name]/Extender: [name] [name] in Treatment: 3 Visit Information History Since Last Visit Added or deleted any medications: No Patient Arrived: Ambulatory Any new allergies or adverse reactions: No Arrival Time: 11:42 Had a fall or experienced change in No Accompanied By: self activities of daily living that may affect Transfer Assistance: None risk of falls: Patient Identification Verified: Yes Signs or symptoms of abuse/neglect since last visito No Secondary Verification Process Completed: Yes Hospitalized since last visit: No Patient Requires Transmission-Based Precautions: No Implantable device outside of the clinic excluding No Patient Has Alerts: No cellular tissue based products placed in the center since last visit: Has Dressing in Place as Prescribed: Yes Has Compression in Place as Prescribed: Yes Pain Present Now: No Electronic Signature(s) Signed: 02/16/2023 3:47:51 PM By: [name] [name] Entered By: [name] [name] on 02/16/2023 11:43:51 -------------------------------------------------------------------------------- Compression Therapy Details Patient Name: Date of Service: [name] [name], [name] 02/16/2023 11:30 A M Medical Record Number: [id number] Patient Account Number: [id number] Date of Birth/Sex: Treating RN: 01/07/1981 (42 y.o. [name] [name] Primary Care Chameka Mcmullen: [name] [name] Other Clinician: Referring Caterra Ostroff: Treating [name] [name]/Extender: [name] [name] in  Treatment: 3 Compression Therapy Performed for Wound Assessment: Wound #1 Left,Circumferential Lower Leg Performed By: Clinician [name] Bruin, RN Compression Type: Henriette Combs Post Procedure Diagnosis Same as Pre-procedure Electronic Signature(s) Signed: 02/16/2023 3:47:51 PM By: [name] [name] By: [name] [name] on 02/16/2023 11:57:20 [name] [name], [name] [name] ([id number]) [id number] Page 2 of 7 -------------------------------------------------------------------------------- Encounter Discharge Information Details Patient Name: Date of Service: [name] [name], [name] [place] S. 02/16/2023 11:30 A M Medical Record Number: [id number] Patient Account Number: [id number] Date of Birth/Sex: Treating RN: May 15, 1981 (42 y.o. [name] [name] Primary Care [place]: [name] [name] Other Clinician: Referring Kylo Gavin: Treating [name] [name]/Extender: [name] [name] in Treatment: 3 Encounter Discharge Information Items Discharge Condition: Stable Ambulatory Status: Ambulatory Discharge Destination: Home Transportation: Private Auto Accompanied By: self Schedule Follow-up Appointment: Yes Clinical Summary of Care: Patient Declined Electronic Signature(s) Signed: 02/16/2023 3:47:51 PM By: [name] [name] By: [name] [name] on 02/16/2023 12:40:08 -------------------------------------------------------------------------------- Lower Extremity Assessment Details Patient Name: Date of Service: [name] [name], [name] 02/16/2023 11:30 A M Medical Record Number: [id number] Patient Account Number: [id number] Date of Birth/Sex: Treating RN: 1981-08-10 (41 y.o. [name] [name] Primary Care Nizhoni Parlow: [name] [name] Other Clinician: Referring [name] [name]: Treating [name] [name]/Extender: [name] [name] in Treatment: 3 Edema Assessment Assessed: [Left: No] [Right: No] Edema: [Left: Ye] [Right: s] Calf Left:  Right: Point of Measurement: 32 cm From Medial Instep 65 cm 55.5 cm Ankle Left: Right: Point of Measurement: 11 cm From Medial Instep 28 cm 29 cm Knee To Floor Left: Right: From Medial Instep 38 cm 40 cm Vascular Assessment Pulses: Dorsalis Pedis Palpable: [Left:Yes] Electronic Signature(s) Signed: 02/16/2023 3:47:51 PM By: [name] [name] Entered By: [name] [name] on 02/16/2023 12:00:20 Grape, [name] [name] ([id number]) [id number] Page 3 of 7 -------------------------------------------------------------------------------- Multi Wound Chart Details Patient Name: Date of Service: [name] [name], [name] [place] S. 02/16/2023 11:30 A M Medical Record Number: [id number] Patient Account Number: [id number] Date of Birth/Sex: Treating RN: [birth year] (42 y.o. F) Primary Care  [name] [name]: [name] [name] Other Clinician: Referring [name] [name]: Treating [name] [name]/Extender: [name] [name] in Treatment: 3 Vital Signs Height(in): 65 Pulse(bpm): 65 Weight(lbs): 430 Blood Pressure(mmHg): 147/76 Body Mass Index(BMI): 71.5 Temperature(F): 98.2 Respiratory Rate(breaths/min): 20 [1:Photos:] [N/A:N/A] Left, Circumferential Lower Leg N/A N/A Wound Location: Gradually Appeared N/A N/A Wounding Event: Venous Leg Ulcer N/A N/A Primary Etiology: Lymphedema, Sleep Apnea, N/A N/A Comorbid History: Hypertension, Peripheral Venous Disease, Type II Diabetes 01/25/2023 N/A N/A Date Acquired: 3 N/A N/A Weeks of Treatment: Open N/A N/A Wound Status: No N/A N/A Wound Recurrence: 0.1x0.1x0.1 N/A N/A Measurements L x W x D (cm) 0.008 N/A N/A A (cm) : rea 0.001 N/A N/A Volume (cm) : 100.00% N/A N/A % Reduction in Area: 100.00% N/A N/A % Reduction in Volume: Full Thickness Without Exposed N/A N/A Classification: Support Structures None Present N/A N/A Exudate Amount: Distinct, outline attached N/A N/A Wound Margin: None Present (0%) N/A N/A Granulation  Amount: None Present (0%) N/A N/A Necrotic Amount: Fascia: No N/A N/A Exposed Structures: Fat Layer (Subcutaneous Tissue): No Tendon: No Muscle: No Joint: No Bone: No Limited to Skin Breakdown Large (67-100%) N/A N/A Epithelialization: No Abnormalities Noted N/A N/A Periwound Skin Texture: Maceration: Yes N/A N/A Periwound Skin Moisture: Hemosiderin Staining: Yes N/A N/A Periwound Skin Color: No Abnormality N/A N/A Temperature: Compression Therapy N/A N/A Procedures Performed: Treatment Notes Electronic Signature(s) Signed: 02/16/2023 12:10:14 PM By: Duanne Guess MD FACS Entered By: Duanne Guess on 02/16/2023 12:10:14 Gratz, [name] [name] ([id number]) [id number] Page 4 of 7 -------------------------------------------------------------------------------- Multi-Disciplinary Care Plan Details Patient Name: Date of Service: [name] [name], [name] [place] S. 02/16/2023 11:30 A M Medical Record Number: [id number] Patient Account Number: [id number] Date of Birth/Sex: Treating RN: [birth year] (42 y.o. [name] [name] Primary Care Tayari Yankee: [name] [name] Other Clinician: Referring Daray Polgar: Treating Alysiana Ethridge/Extender: [name] [name] in Treatment: 3 Active Inactive Venous Leg Ulcer Nursing Diagnoses: Actual venous Insuffiency (use after diagnosis is confirmed) Knowledge deficit related to disease process and management Goals: Patient will maintain optimal edema control Date Initiated: 01/25/2023 Target Resolution Date: 04/22/2023 Goal Status: Active Patient/caregiver will verbalize understanding of disease process and disease management Date Initiated: 01/25/2023 Target Resolution Date: 04/22/2023 Goal Status: Active Interventions: Assess peripheral edema status every visit. Compression as ordered Provide education on venous insufficiency Notes: Electronic Signature(s) Signed: 02/16/2023 3:47:51 PM By: [name] [name] Entered  By: [name] [name] on 02/16/2023 12:39:36 -------------------------------------------------------------------------------- Pain Assessment Details Patient Name: Date of Service: [name] [name], [name] 02/16/2023 11:30 A M Medical Record Number: [id number] Patient Account Number: [id number] Date of Birth/Sex: Treating RN: [birth year] (42 y.o. [name] [name] Primary Care Thelma Viana: [name] [name] Other Clinician: Referring Ralf Konop[place]: Treating Rae Plotner/Extender: [name] [name] in Treatment: 3 Active Problems Location of Pain Severity and Description of Pain Patient Has Paino No Site Locations Rate the pain. [name] [name], [name] [name] ([id number]) [id number] Page 5 of 7 Rate the pain. Current Pain Level: 0 Pain Management and Medication Current Pain Management: Electronic Signature(s) Signed: 02/16/2023 3:47:51 PM By: [name] [name] Entered By: [name] [name] on 02/16/2023 11:43:59 -------------------------------------------------------------------------------- Patient/Caregiver Education Details Patient Name: Date of Service: [name] [name], [name] [name] 6/26/2024andnbsp11:30 A M Medical Record Number: [id number] Patient Account Number: [id number] Date of Birth/Gender: Treating RN: [birth year] (42 y.o. [name] [name] Primary Care Physician: [name] [name] Other Clinician: Referring Physician: Treating Physician/Extender: [name] [name] in Treatment: 3 Education Assessment Education Provided To: Patient Education Topics Provided Wound/Skin Impairment: Methods: Explain/Verbal Responses: Reinforcements needed, State content  correctly Electronic Signature(s) Signed: 02/16/2023 3:47:51 PM By: [name] [name] By: [name] [name] on 02/16/2023 12:39:49 -------------------------------------------------------------------------------- Wound Assessment Details Patient Name: Date of Service: [name] [name], [name] 02/16/2023 11:30 A M Medical Record Number: [id number] Patient Account Number: [id number] Date of Birth/Sex: Treating RN: 05-Apr-1981 (42 y.o. [name] [name] Primary Care Ceyda Peterka: [name] [name] Other Clinician: Referring Dorsel Flinn: Treating Jazma Pickel/Extender: Waynard Reeds [place], [place] ([id number]) [id number] Page 6 of 7 Weeks in Treatment: 3 Wound Status Wound Number: 1 Primary Venous Leg Ulcer Etiology: Wound Location: Left, Circumferential Lower Leg Wound Open Wounding Event: Gradually Appeared Status: Date Acquired: 01/25/2023 Comorbid Lymphedema, Sleep Apnea, Hypertension, Peripheral Venous Weeks Of Treatment: 3 History: Disease, Type II Diabetes Clustered Wound: No Photos Wound Measurements Length: (cm) 0.1 Width: (cm) 0.1 Depth: (cm) 0.1 Area: (cm) 0.008 Volume: (cm) 0.001 % Reduction in Area: 100% % Reduction in Volume: 100% Epithelialization: Large (67-100%) Tunneling: No Undermining: No Wound Description Classification: Full Thickness Without Exposed Support Structures Wound Margin: Distinct, outline attached Exudate Amount: None Present Foul Odor After Cleansing: No Slough/Fibrino No Wound Bed Granulation Amount: None Present (0%) Exposed Structure Necrotic Amount: None Present (0%) Fascia Exposed: No Fat Layer (Subcutaneous Tissue) Exposed: No Tendon Exposed: No Muscle Exposed: No Joint Exposed: No Bone Exposed: No Limited to Skin Breakdown Periwound Skin Texture Texture Color No Abnormalities Noted: Yes No Abnormalities Noted: No Hemosiderin Staining: Yes Moisture No Abnormalities Noted: No Temperature / Pain Maceration: Yes Temperature: No Abnormality Treatment Notes Wound #1 (Lower Leg) Wound Laterality: Left, Circumferential Cleanser Soap and Water Discharge Instruction: May shower and wash wound with dial antibacterial soap and water prior to dressing change. Vashe  5.8 (oz) Discharge Instruction: Cleanse the wound with Vashe prior to applying a clean dressing using gauze sponges, not tissue or cotton balls. Peri-Wound Care Sween Lotion (Moisturizing lotion) Discharge Instruction: Apply moisturizing lotion as directed Topical Primary Dressing Secondary Dressing UNDINE, [name] [name] ([id number]) [id number] Page 7 of 7 Secured With Compression Wrap Unnaboot w/Calamine, 4x10 (in/yd) Discharge Instruction: Apply Unnaboot as directed. Compression Stockings Jobst Farrow Wrap 4000 Quantity: 1 Left Leg Compression Amount: 30-40 mmHg Discharge Instruction: Apply Renee Pain daily as instructed. Apply first thing in the morning, remove at night before bed. Add-Ons Electronic Signature(s) Signed: 02/16/2023 3:47:51 PM By: [name] [name] Entered By: [name] [name] on 02/16/2023 11:54:08 -------------------------------------------------------------------------------- Vitals Details Patient Name: Date of Service: [name] [name], [name] 02/16/2023 11:30 A M Medical Record Number: [id number] Patient Account Number: [id number] Date of Birth/Sex: Treating RN: [birth year] (42 y.o. [name] [name] Primary Care Surya Schroeter: [name] [name] Other Clinician: Referring Niang Mitcheltree: Treating Corian Handley/Extender: [name] [name] in Treatment: 3 Vital Signs Time Taken: 11:44 Temperature (F): 98.2 Height (in): 65 Pulse (bpm): 65 Weight (lbs): 430 Respiratory Rate (breaths/min): 20 Body Mass Index (BMI): 71.5 Blood Pressure (mmHg): 147/76 Reference Range: 80 - 120 mg / dl Electronic Signature(s) Signed: 02/16/2023 3:47:51 PM By: [name] [name] Entered By: [name] [name] on 02/16/2023 11:46:17

## 2023-02-16 NOTE — Progress Notes (Addendum)
HARRIETTA, LAMPER ([id number]) [id number] Page 1 of 8 Visit Report for 02/16/2023 Chief Complaint Document Details Patient Name: Date of Service: [name] [name], [name] [place] S. 02/16/2023 11:30 A M Medical Record Number: [id number] Patient Account Number: [id number] Date of Birth/Sex: Treating RN: Mar 01, 1981 (42 y.o. F) Primary Care Provider: Alysia Penna Other Clinician: Referring Provider: Treating Provider/Extender: Sherlynn Stalls in Treatment: 3 Information Obtained from: Patient Chief Complaint Patient presents for treatment of an open ulcer due to venous insufficiency in the setting of severe stage 3 lymphedema Electronic Signature(s) Signed: 02/16/2023 12:10:23 PM By: Duanne Guess MD FACS Entered By: Duanne Guess on 02/16/2023 12:10:22 -------------------------------------------------------------------------------- HPI Details Patient Name: Date of Service: [name] [name], [name] 02/16/2023 11:30 A M Medical Record Number: [id number] Patient Account Number: [id number] Date of Birth/Sex: Treating RN: May 06, 1981 (42 y.o. F) Primary Care Provider: Alysia Penna Other Clinician: Referring Provider: Treating Provider/Extender: Sherlynn Stalls in Treatment: 3 History of Present Illness HPI Description: ADMISSION 01/25/2023 This is a 42 year old type II diabetic (last hemoglobin A1c 6.9%) who is super morbidly obese and relies on a tracheostomy as well as oxygen via nasal cannula during the day. She has congestive heart failure and the skin changes in her legs are consistent with lymphedema. She was referred to [place] for venous stasis dermatitis however she has circumferential breakdown of the skin on her left leg. There is thick slough formation. ABIs were noncompressible, secondary to the lymphedema, but she does have a palpable dorsalis pedis pulse. 02/01/2023: The wound is covered with a thick layer of  yellowish-gray slimy slough and has a strong putrid odor. 02/10/2023: The wound is less malodorous today and has less slime on the surface, but there is still slough present and a persistent odor. The culture that I took last week was polymicrobial with predominant species including Morganella and a plethora of skin flora. Both Augmentin and Bactrim were recommended as treatment in combination and these were prescribed. Unfortunately, she is unable to afford Keystone topical antibiotic compound. She is tolerating her oral therapy well, however. 02/16/2023: The odor has completely abated and the wound surfaces are clean and dry. There is just a very small remaining open area on her posteromedial leg. She does not have compression garments, nor does she have lymphedema pumps. Electronic Signature(s) Signed: 02/16/2023 12:11:10 PM By: Duanne Guess MD FACS Entered By: Duanne Guess on 02/16/2023 12:11:10 Swing, [name] ([id number]) [id number] Page 2 of 8 -------------------------------------------------------------------------------- Physical Exam Details Patient Name: Date of Service: [name] [name], [name] [place] S. 02/16/2023 11:30 A M Medical Record Number: [id number] Patient Account Number: [id number] Date of Birth/Sex: Treating RN: 1980-11-29 (41 y.o. F) Primary Care Provider: Alysia Penna Other Clinician: Referring Provider: Treating Provider/Extender: Sherlynn Stalls in Treatment: 3 Constitutional Slightly hypertensive. . . . no acute distress. Respiratory Normal work of breathing on supplemental oxygen. Notes 02/16/2023: The odor has completely abated and the wound surfaces are clean and dry. There is just a very small remaining open area on her posteromedial leg. Electronic Signature(s) Signed: 02/16/2023 12:11:41 PM By: Duanne Guess MD FACS Entered By: Duanne Guess on 02/16/2023  12:11:41 -------------------------------------------------------------------------------- Physician Orders Details Patient Name: Date of Service: [name] [name], [name] 02/16/2023 11:30 A M Medical Record Number: [id number] Patient Account Number: [id number] Date of Birth/Sex: Treating RN: [birth year] (42 y.o. Fredderick Phenix Primary Care Provider: Alysia Penna Other Clinician: Referring Provider: Treating Provider/Extender: Sherlynn Stalls in Treatment:  3 Verbal / Phone Orders: No Diagnosis Coding ICD-10 Coding Code Description [phone] Non-pressure chronic ulcer of other part of left lower leg with fat layer exposed E11.622 Type 2 diabetes mellitus with other skin ulcer I89.0 Lymphedema, not elsewhere classified I87.2 Venous insufficiency (chronic) (peripheral) I50.32 Chronic diastolic (congestive) heart failure E66.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 Z93.0 Tracheostomy status Z99.81 Dependence on supplemental oxygen Follow-up Appointments ppointment in 1 week. - Dr Lady Gary - room 1 Return A Bathing/ Shower/ Hygiene May shower with protection but do not get wound dressing(s) wet. Protect dressing(s) with water repellant cover (for example, large plastic bag) or a cast cover and may then take shower. Edema Control - Lymphedema / SCD / Other Bilateral Lower Extremities Lymphedema Pumps. Use Lymphedema pumps on leg(s) 2-3 times a day for 45-60 minutes. If wearing any wraps or hose, do not remove them. Continue exercising as instructed. Elevate legs to the level of the heart or above for 30 minutes daily and/or when sitting for 3-4 times a day throughout the day. Avoid standing for long periods of time. Patient to wear own compression stockings every day. [name] [name] ([id number]) [id number] Page 3 of 8 Compression stocking or Garment 20-30 mm/Hg pressure to: - you have stage III lymphedema and will  require compression garments (Jobst Rosamaria Lints) for life 3 garments per leg every 6 months. Wound Treatment Wound #1 - Lower Leg Wound Laterality: Left, Circumferential Cleanser: Soap and Water 1 x Per Week/30 Days Discharge Instructions: May shower and wash wound with dial antibacterial soap and water prior to dressing change. Cleanser: Vashe 5.8 (oz) 1 x Per Week/30 Days Discharge Instructions: Cleanse the wound with Vashe prior to applying a clean dressing using gauze sponges, not tissue or cotton balls. Peri-Wound Care: Sween Lotion (Moisturizing lotion) 1 x Per Week/30 Days Discharge Instructions: Apply moisturizing lotion as directed Compression Wrap: Unnaboot w/Calamine, 4x10 (in/yd) 1 x Per Week/30 Days Discharge Instructions: Apply Unnaboot as directed. Compression Stockings: Jobst Farrow Wrap 4000 Left Leg Compression Amount: 30-40 mmHG Discharge Instructions: Apply Renee Pain daily as instructed. Apply first thing in the morning, remove at night before bed. Electronic Signature(s) Signed: 02/16/2023 2:01:12 PM By: Duanne Guess MD FACS Signed: 02/16/2023 3:47:51 PM By: Gelene Mink By: Samuella Bruin on 02/16/2023 13:42:00 -------------------------------------------------------------------------------- Problem List Details Patient Name: Date of Service: [name] [name], [name] 02/16/2023 11:30 A M Medical Record Number: [id number] Patient Account Number: [id number] Date of Birth/Sex: Treating RN: 11-01-1980 (42 y.o. F) Primary Care Provider: Alysia Penna Other Clinician: Referring Provider: Treating Provider/Extender: Sherlynn Stalls in Treatment: 3 Active Problems ICD-10 Encounter Code Description Active Date MDM Diagnosis [phone] Non-pressure chronic ulcer of other part of left lower leg with fat layer exposed6/11/2022 No Yes E11.622 Type 2 diabetes mellitus with other skin ulcer 01/25/2023 No Yes I89.0 Lymphedema, not  elsewhere classified 01/25/2023 No Yes I87.2 Venous insufficiency (chronic) (peripheral) 01/25/2023 No Yes I50.32 Chronic diastolic (congestive) heart failure 01/25/2023 No Yes E66.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 01/25/2023 No Yes Z93.0 Tracheostomy status 01/25/2023 No Yes Thobe, Alica S ([id number]) [id number] Page 4 of 8 Z99.81 Dependence on supplemental oxygen 01/25/2023 No Yes Inactive Problems Resolved Problems Electronic Signature(s) Signed: 02/16/2023 12:10:08 PM By: Duanne Guess MD FACS Entered By: Duanne Guess on 02/16/2023 12:10:08 -------------------------------------------------------------------------------- Progress Note Details Patient Name: Date of Service: [name] [name], [name] 02/16/2023 11:30 A M Medical Record Number: [id number] Patient Account Number: [id number] Date of Birth/Sex: Treating RN:  [birth year] (41 y.o. F) Primary Care Provider: Alysia Penna Other Clinician: Referring Provider: Treating Provider/Extender: Sherlynn Stalls in Treatment: 3 Subjective Chief Complaint Information obtained from Patient Patient presents for treatment of an open ulcer due to venous insufficiency in the setting of severe stage 3 lymphedema History of Present Illness (HPI) ADMISSION 01/25/2023 This is a 42 year old type II diabetic (last hemoglobin A1c 6.9%) who is super morbidly obese and relies on a tracheostomy as well as oxygen via nasal cannula during the day. She has congestive heart failure and the skin changes in her legs are consistent with lymphedema. She was referred to [place] for venous stasis dermatitis however she has circumferential breakdown of the skin on her left leg. There is thick slough formation. ABIs were noncompressible, secondary to the lymphedema, but she does have a palpable dorsalis pedis pulse. 02/01/2023: The wound is covered with a thick layer of yellowish-gray slimy slough and has  a strong putrid odor. 02/10/2023: The wound is less malodorous today and has less slime on the surface, but there is still slough present and a persistent odor. The culture that I took last week was polymicrobial with predominant species including Morganella and a plethora of skin flora. Both Augmentin and Bactrim were recommended as treatment in combination and these were prescribed. Unfortunately, she is unable to afford [facility] topical antibiotic compound. She is tolerating her oral therapy well, however. 02/16/2023: The odor has completely abated and the wound surfaces are clean and dry. There is just a very small remaining open area on her posteromedial leg. She does not have compression garments, nor does she have lymphedema pumps. Patient History Information obtained from Patient. Family History Cancer - Paternal Grandparents, Diabetes - Mother, Heart Disease - Maternal Grandparents, Hypertension - Mother, No family history of Kidney Disease, Lung Disease, Seizures, Stroke, Thyroid Problems, Tuberculosis. Social History Never smoker, Marital Status - Single, Alcohol Use - Never, Drug Use - No History, Caffeine Use - Never. Medical History Hematologic/Lymphatic Patient has history of Lymphedema Respiratory Patient has history of Sleep Apnea Cardiovascular Patient has history of Hypertension, Peripheral Venous Disease Endocrine Patient has history of Type II Diabetes Integumentary (Skin) Denies history of History of Burn Hospitalization/Surgery History - tracheostomy 2017. Medical A Surgical History Notes nd LULAR, ELIE ([id number]) [id number] Page 5 of 8 Respiratory chronic respiratory failure tracheostomy dependent Cardiovascular diastolic heart failure pericardial effusion pulmonary hypertension Gastrointestinal hepatic congestion dysphagia Endocrine hyperlipidemia, morbid obesity Objective Constitutional Slightly hypertensive. no acute  distress. Vitals Time Taken: 11:44 AM, Height: 65 in, Weight: 430 lbs, BMI: 71.5, Temperature: 98.2 F, Pulse: 65 bpm, Respiratory Rate: 20 breaths/min, Blood Pressure: 147/76 mmHg. Respiratory Normal work of breathing on supplemental oxygen. General Notes: 02/16/2023: The odor has completely abated and the wound surfaces are clean and dry. There is just a very small remaining open area on her posteromedial leg. Integumentary (Hair, Skin) Wound #1 status is Open. Original cause of wound was Gradually Appeared. The date acquired was: 01/25/2023. The wound has been in treatment 3 weeks. The wound is located on the Left,Circumferential Lower Leg. The wound measures 0.1cm length x 0.1cm width x 0.1cm depth; 0.008cm^2 area and 0.001cm^3 volume. The wound is limited to skin breakdown. There is no tunneling or undermining noted. There is a none present amount of drainage noted. The wound margin is distinct with the outline attached to the wound base. There is no granulation within the wound bed. There is no necrotic tissue within the wound bed. The periwound  skin appearance had no abnormalities noted for texture. The periwound skin appearance exhibited: Maceration, Hemosiderin Staining. Periwound temperature was noted as No Abnormality. 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 There was a Radio broadcast assistant Compression Therapy Procedure by Samuella Bruin, RN. Post procedure Diagnosis Wound #1: Same as  Pre-Procedure Plan Follow-up Appointments: Return Appointment in 1 week. - Dr Lady Gary - room 1 Bathing/ Shower/ Hygiene: May shower with protection but do not get wound dressing(s) wet. Protect dressing(s) with water repellant cover (for example, large plastic bag) or a cast cover and may then take shower. Edema Control - Lymphedema / SCD / Other: Lymphedema Pumps. Use Lymphedema pumps on leg(s) 2-3 times a day for 45-60 minutes. If wearing any wraps or hose, do not remove them. Continue exercising as instructed. Elevate legs to the level of the heart or above for 30 minutes daily and/or when sitting for 3-4 times a day throughout the day. PRAJNA, THRONEBERRY ([id number]) [id number] Page 6 of 8 Avoid standing for long periods of time. Patient to wear own compression stockings every day. Compression stocking or Garment 20-30 mm/Hg pressure to: - you have stage III lymphedema and will require compression garments (Jobst Rosamaria Lints) for life 3 garments per leg every 6 months. WOUND #1: - Lower Leg Wound Laterality: Left, Circumferential Cleanser: Soap and Water 1 x Per Week/30 Days Discharge Instructions: May shower and wash wound with dial antibacterial soap and water prior to dressing change. Cleanser: Vashe 5.8 (oz) 1 x Per Week/30 Days Discharge Instructions: Cleanse the wound with Vashe prior to applying a clean dressing using gauze sponges, not tissue or cotton balls. Peri-Wound Care: Sween Lotion (Moisturizing lotion) 1 x Per Week/30 Days Discharge Instructions: Apply moisturizing lotion as directed Com pression Wrap: Unnaboot w/Calamine, 4x10 (in/yd) 1 x Per Week/30 Days Discharge Instructions: Apply Unnaboot as directed. Com pression Stockings: Jobst Farrow Wrap 4000 Compression Amount: 30-40 mmHg (left) Discharge Instructions: Apply Renee Pain daily as instructed. Apply first thing in the morning, remove at night before bed. 02/16/2023: The odor has  completely abated and the wound surfaces are clean and dry. There is just a very small remaining open area on her posteromedial leg. No debridement was necessary today. We will apply silver alginate and an Radio broadcast assistant. We are going to order lymphedema pumps and Farrow wraps for her. She has severe stage III lymphedema and without regular use of compression and lymphedema pumps, she will be at high risk of future wounding events. She will need this therapy for the remainder of her life. She is unable to exercise secondary to her other medical comorbidities, including tracheostomy dependence and chronic oxygen dependence. Follow-up in about 1 week. Electronic Signature(s) Signed: 02/16/2023 2:01:12 PM By: Duanne Guess MD FACS Signed: 02/16/2023 3:47:51 PM By: Samuella Bruin Previous Signature: 02/16/2023 1:30:54 PM Version By: Duanne Guess MD FACS Previous Signature: 02/16/2023 12:12:51 PM Version By: Duanne Guess MD FACS Entered By: Samuella Bruin on 02/16/2023 13:42:14 -------------------------------------------------------------------------------- HxROS Details Patient Name: Date of Service: [name] [name], [name] 02/16/2023 11:30 A M Medical Record Number: [id number] Patient Account Number: [id number] Date of Birth/Sex:  Treating RN: 06-Oct-1980 (42 y.o. F) Primary Care Provider: Alysia Penna Other Clinician: Referring Provider: Treating Provider/Extender: Sherlynn Stalls in Treatment: 3 Information Obtained From Patient Hematologic/Lymphatic Medical History: Positive for: Lymphedema Respiratory Medical History: Positive for: Sleep Apnea Past Medical History Notes: chronic respiratory failure tracheostomy dependent Cardiovascular Medical History: Positive for: Hypertension; Peripheral Venous Disease Past Medical History Notes: diastolic heart failure pericardial effusion pulmonary hypertension Gastrointestinal Medical History: Past  Medical History NotesRONNIKA, PINCH ([id number]) [id number] Page 7 of 8 hepatic congestion dysphagia Endocrine Medical History: Positive for: Type II Diabetes Past Medical History Notes: hyperlipidemia, morbid obesity Time with diabetes: <1 month Treated with: Oral agents Blood sugar tested every day: No Integumentary (Skin) Medical History: Negative for: History of Burn Immunizations Pneumococcal Vaccine: Received Pneumococcal Vaccination: No Implantable Devices None Hospitalization / Surgery History Type of Hospitalization/Surgery tracheostomy 2017 Family and Social History Cancer: Yes - Paternal Grandparents; Diabetes: Yes - Mother; Heart Disease: Yes - Maternal Grandparents; Hypertension: Yes - Mother; Kidney Disease: No; Lung Disease: No; Seizures: No; Stroke: No; Thyroid Problems: No; Tuberculosis: No; Never smoker; Marital Status - Single; Alcohol Use: Never; Drug Use: No History; Caffeine Use: Never; Financial Concerns: No; Food, Clothing or Shelter Needs: No; Support System Lacking: No; Transportation Concerns: No Electronic Signature(s) Signed: 02/16/2023 2:01:12 PM By: Duanne Guess MD FACS Entered By: Duanne Guess on 02/16/2023 12:11:16 -------------------------------------------------------------------------------- SuperBill Details Patient Name: Date of Service: [name] [name], [name] 02/16/2023 Medical Record Number: [id number] Patient Account Number: [id number] Date of Birth/Sex: Treating RN: [birth year] (42 y.o. F) Primary Care Provider: Alysia Penna Other Clinician: Referring Provider: Treating Provider/Extender: Sherlynn Stalls in Treatment: 3 Diagnosis Coding ICD-10 Codes Code Description [phone] Non-pressure chronic ulcer of other part of left lower leg with fat layer exposed E11.622 Type 2 diabetes mellitus with other skin ulcer I89.0 Lymphedema, not elsewhere classified I87.2 Venous  insufficiency (chronic) (peripheral) I50.32 Chronic diastolic (congestive) heart failure E66.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 Z93.0 Tracheostomy status Z99.81 Dependence on supplemental oxygen Facility Procedures KORYN, FRETZ ([id number]): CPT4 Code Description [id number] (Facility Use Only) [phone] - APPLY MULTLAY COMPRS LWR LT LEG ICD-10 Diagnosis Description L97.822 Non-pressure chronic ulcer of other part of left lower leg with fat E11.622 Type 2 diabetes  mellitus with other skin ulcer I87.2 Venous insufficiency (chronic) (peripheral) I89.0 Lymphedema, not elsewhere classified [id number] Page 8 of 8: Modifier Quantity 1 layer exposed Physician Procedures : CPT4 Code Description Modifier [id number] - WC PHYS LEVEL 3 - EST PT ICD-10 Diagnosis Description L97.822 Non-pressure chronic ulcer of other part of left lower leg with fat layer exposed I89.0 Lymphedema, not elsewhere classified I87.2 Venous  insufficiency (chronic) (peripheral) E11.622 Type 2 diabetes mellitus with other skin ulcer Quantity: 1 Electronic Signature(s) Signed: 02/21/2023 12:48:56 PM By: Pearletha Alfred Signed: 02/21/2023 1:40:35 PM By: Duanne Guess MD FACS Previous Signature: 02/16/2023 12:15:00 PM Version By: Duanne Guess MD FACS Entered By: Pearletha Alfred on 02/21/2023 12:48:56

## 2023-02-28 ENCOUNTER — Encounter (HOSPITAL_BASED_OUTPATIENT_CLINIC_OR_DEPARTMENT_OTHER): Payer: Medicare Other | Attending: General Surgery | Admitting: General Surgery

## 2023-02-28 DIAGNOSIS — L97822 Non-pressure chronic ulcer of other part of left lower leg with fat layer exposed: Secondary | ICD-10-CM | POA: Insufficient documentation

## 2023-02-28 DIAGNOSIS — E11622 Type 2 diabetes mellitus with other skin ulcer: Secondary | ICD-10-CM | POA: Insufficient documentation

## 2023-02-28 DIAGNOSIS — E785 Hyperlipidemia, unspecified: Secondary | ICD-10-CM | POA: Insufficient documentation

## 2023-02-28 DIAGNOSIS — I872 Venous insufficiency (chronic) (peripheral): Secondary | ICD-10-CM | POA: Diagnosis not present

## 2023-02-28 DIAGNOSIS — I272 Pulmonary hypertension, unspecified: Secondary | ICD-10-CM | POA: Diagnosis not present

## 2023-02-28 DIAGNOSIS — Z9981 Dependence on supplemental oxygen: Secondary | ICD-10-CM | POA: Insufficient documentation

## 2023-02-28 DIAGNOSIS — Z6841 Body Mass Index (BMI) 40.0 and over, adult: Secondary | ICD-10-CM | POA: Diagnosis not present

## 2023-02-28 DIAGNOSIS — I110 Hypertensive heart disease with heart failure: Secondary | ICD-10-CM | POA: Diagnosis not present

## 2023-02-28 DIAGNOSIS — E66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: Secondary | ICD-10-CM | POA: Insufficient documentation

## 2023-02-28 DIAGNOSIS — I890 Lymphedema, not elsewhere classified: Secondary | ICD-10-CM | POA: Diagnosis not present

## 2023-02-28 DIAGNOSIS — Z930 Tracheostomy status: Secondary | ICD-10-CM | POA: Insufficient documentation

## 2023-02-28 DIAGNOSIS — I5032 Chronic diastolic (congestive) heart failure: Secondary | ICD-10-CM | POA: Insufficient documentation

## 2023-02-28 NOTE — Progress Notes (Signed)
PENNEY, PFENNINGER ([id number]) [id number] Page 1 of 8 Visit Report for 02/28/2023 Chief Complaint Document Details Patient Name: Date of Service: [name] [name], [name] [name] [place] S. 02/28/2023 1:45 PM Medical Record Number: [id number] Patient Account Number: [id number] Date of Birth/Sex: Treating RN: November 11, 1980 (42 y.o. F) Primary Care Provider: Alysia Penna Other Clinician: Referring Provider: Treating Provider/Extender: Sherlynn Stalls in Treatment: 4 Information Obtained from: Patient Chief Complaint Patient presents for treatment of an open ulcer due to venous insufficiency in the setting of severe stage 3 lymphedema Electronic Signature(s) Signed: 02/28/2023 2:49:54 PM By: Duanne Guess MD FACS Entered By: Duanne Guess on 02/28/2023 14:49:54 -------------------------------------------------------------------------------- HPI Details Patient Name: Date of Service: [name] [name], [name] [name] [place] S. 02/28/2023 1:45 PM Medical Record Number: [id number] Patient Account Number: [id number] Date of Birth/Sex: Treating RN: [birth year] (42 y.o. F) Primary Care Provider: Alysia Penna Other Clinician: Referring Provider: Treating Provider/Extender: Sherlynn Stalls in Treatment: 4 History of Present Illness HPI Description: ADMISSION 01/25/2023 This [name] a 42 year old type II diabetic (last hemoglobin A1c 6.9%) who [name] super morbidly obese and relies on a tracheostomy as well as oxygen via nasal cannula during the day. She has congestive heart failure and the skin changes in her legs are consistent with lymphedema. She was referred to [place] for venous stasis dermatitis however she has circumferential breakdown of the skin on her left leg. There [name] thick slough formation. ABIs were noncompressible, secondary to the lymphedema, but she does have a palpable dorsalis pedis pulse. 02/01/2023: The wound [name] covered with a thick layer of yellowish-gray  slimy slough and has a strong putrid odor. 02/10/2023: The wound [name] less malodorous today and has less slime on the surface, but there [name] still slough present and a persistent odor. The culture that I took last week was polymicrobial with predominant species including Morganella and a plethora of skin flora. Both Augmentin and Bactrim were recommended as treatment in combination and these were prescribed. Unfortunately, she [name] unable to afford [facility] topical antibiotic compound. She [name] tolerating her oral therapy well, however. 02/16/2023: The odor has completely abated and the wound surfaces are clean and dry. There [name] just a very small remaining open area on her posteromedial leg. She does not have compression garments, nor does she have lymphedema pumps. 02/28/2023: The remaining open area on her posteromedial leg [name] down to around a millimeter. Still no word regarding her lymphedema pumps or compression garments. Electronic Signature(s) Signed: 02/28/2023 2:50:35 PM By: Duanne Guess MD FACS Entered By: Duanne Guess on 02/28/2023 14:50:35 Dann, [name] ([id number]) [id number] Page 2 of 8 -------------------------------------------------------------------------------- Physical Exam Details Patient Name: Date of Service: [name] [name], [name] [name] [place] S. 02/28/2023 1:45 PM Medical Record Number: [id number] Patient Account Number: [id number] Date of Birth/Sex: Treating RN: Dec 26, 1980 (42 y.o. F) Primary Care Provider: Alysia Penna Other Clinician: Referring Provider: Treating Provider/Extender: Sherlynn Stalls in Treatment: 4 Constitutional Slightly hypertensive. . . . no acute distress. Respiratory Normal work of breathing on supplemental oxygen. Notes 02/28/2023: The remaining open area on her leg [name] extremely superficial and about a millimeter in size. Electronic Signature(s) Signed: 02/28/2023 2:51:35 PM By: Duanne Guess MD  FACS Entered By: Duanne Guess on 02/28/2023 14:51:34 -------------------------------------------------------------------------------- Physician Orders Details Patient Name: Date of Service: [name] [name], [name] [name] [place] S. 02/28/2023 1:45 PM Medical Record Number: [id number] Patient Account Number: [id number] Date of Birth/Sex: Treating RN: Dec 05, 1980 (42 y.o. Katrinka Blazing Primary Care Provider: Alysia Penna Other Clinician:  Referring Provider: Treating Provider/Extender: Sherlynn Stalls in Treatment: 4 Verbal / Phone Orders: No Diagnosis Coding ICD-10 Coding Code Description [phone] Non-pressure chronic ulcer of other part of left lower leg with fat layer exposed E11.622 Type 2 diabetes mellitus with other skin ulcer I89.0 Lymphedema, not elsewhere classified I87.2 Venous insufficiency (chronic) (peripheral) I50.32 Chronic diastolic (congestive) heart failure E66.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 Z93.0 Tracheostomy status Z99.81 Dependence on supplemental oxygen Follow-up Appointments ppointment in 1 week. - Dr Lady Gary - Room 3 Return A Tuesday July 16th at 11:45am Anesthetic Wound #1 Left,Circumferential Lower Leg (In clinic) Topical Lidocaine 4% applied to wound bed Bathing/ Shower/ Hygiene May shower with protection but do not get wound dressing(s) wet. Protect dressing(s) with water repellant cover (for example, large plastic bag) or a cast cover and may then take shower. Edema Control - Lymphedema / SCD / DRU, VERDOORN ([id number]) [id number] Page 3 of 8 Bilateral Lower Extremities Lymphedema Pumps. Use Lymphedema pumps on leg(s) 2-3 times a day for 45-60 minutes. If wearing any wraps or hose, do not remove them. Continue exercising as instructed. Elevate legs to the level of the heart or above for 30 minutes daily and/or when sitting for 3-4 times a day throughout the day. Avoid standing  for long periods of time. Patient to wear own compression stockings every day. Compression stocking or Garment 20-30 mm/Hg pressure to: - you have stage III lymphedema and will require compression garments (Jobst Rosamaria Lints) for life 3 garments per leg every 6 months. Wound Treatment Wound #1 - Lower Leg Wound Laterality: Left, Circumferential Cleanser: Soap and Water 1 x Per Week/30 Days Discharge Instructions: May shower and wash wound with dial antibacterial soap and water prior to dressing change. Cleanser: Vashe 5.8 (oz) 1 x Per Week/30 Days Discharge Instructions: Cleanse the wound with Vashe prior to applying a clean dressing using gauze sponges, not tissue or cotton balls. Peri-Wound Care: Sween Lotion (Moisturizing lotion) 1 x Per Week/30 Days Discharge Instructions: Apply moisturizing lotion as directed Compression Wrap: Unnaboot w/Calamine, 4x10 (in/yd) 1 x Per Week/30 Days Discharge Instructions: Apply Unnaboot as directed. Compression Stockings: Jobst Farrow Wrap 4000 (DME) Left Leg Compression Amount: 30-40 mmHG Right Leg Compression Amount: 30-40 mmHG Discharge Instructions: Apply Renee Pain daily as instructed. Apply first thing in the morning, remove at night before bed. Electronic Signature(s) Signed: 02/28/2023 3:02:25 PM By: Duanne Guess MD FACS Entered By: Duanne Guess on 02/28/2023 14:51:57 -------------------------------------------------------------------------------- Problem List Details Patient Name: Date of Service: [name] [name], [name] [name] [place] S. 02/28/2023 1:45 PM Medical Record Number: [id number] Patient Account Number: [id number] Date of Birth/Sex: Treating RN: 10/31/1980 (42 y.o. F) Primary Care Provider: Alysia Penna Other Clinician: Referring Provider: Treating Provider/Extender: Sherlynn Stalls in Treatment: 4 Active Problems ICD-10 Encounter Code Description Active Date MDM Diagnosis [phone] Non-pressure chronic ulcer of  other part of left lower leg with fat layer exposed6/11/2022 No Yes E11.622 Type 2 diabetes mellitus with other skin ulcer 01/25/2023 No Yes I89.0 Lymphedema, not elsewhere classified 01/25/2023 No Yes I87.2 Venous insufficiency (chronic) (peripheral) 01/25/2023 No Yes I50.32 Chronic diastolic (congestive) heart failure 01/25/2023 No Yes Grizzell, Donetta S ([id number]) [id number] Page 4 of 8 E66.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 01/25/2023 No Yes Z93.0 Tracheostomy status 01/25/2023 No Yes Z99.81 Dependence on supplemental oxygen 01/25/2023 No Yes Inactive Problems Resolved Problems Electronic Signature(s) Signed: 02/28/2023 2:49:41 PM By: Duanne Guess MD FACS Entered By: Duanne Guess on 02/28/2023 14:49:41 -------------------------------------------------------------------------------- Progress  Note Details Patient Name: Date of Service: [name] [name], [name] [name] [place] S. 02/28/2023 1:45 PM Medical Record Number: [id number] Patient Account Number: [id number] Date of Birth/Sex: Treating RN: [birth year] (42 y.o. F) Primary Care Provider: Alysia Penna Other Clinician: Referring Provider: Treating Provider/Extender: Sherlynn Stalls in Treatment: 4 Subjective Chief Complaint Information obtained from Patient Patient presents for treatment of an open ulcer due to venous insufficiency in the setting of severe stage 3 lymphedema History of Present Illness (HPI) ADMISSION 01/25/2023 This [name] a 42 year old type II diabetic (last hemoglobin A1c 6.9%) who [name] super morbidly obese and relies on a tracheostomy as well as oxygen via nasal cannula during the day. She has congestive heart failure and the skin changes in her legs are consistent with lymphedema. She was referred to [place] for venous stasis dermatitis however she has circumferential breakdown of the skin on her left leg. There [name] thick slough formation. ABIs were noncompressible, secondary to  the lymphedema, but she does have a palpable dorsalis pedis pulse. 02/01/2023: The wound [name] covered with a thick layer of yellowish-gray slimy slough and has a strong putrid odor. 02/10/2023: The wound [name] less malodorous today and has less slime on the surface, but there [name] still slough present and a persistent odor. The culture that I took last week was polymicrobial with predominant species including Morganella and a plethora of skin flora. Both Augmentin and Bactrim were recommended as treatment in combination and these were prescribed. Unfortunately, she [name] unable to afford [facility] topical antibiotic compound. She [name] tolerating her oral therapy well, however. 02/16/2023: The odor has completely abated and the wound surfaces are clean and dry. There [name] just a very small remaining open area on her posteromedial leg. She does not have compression garments, nor does she have lymphedema pumps. 02/28/2023: The remaining open area on her posteromedial leg [name] down to around a millimeter. Still no word regarding her lymphedema pumps or compression garments. Patient History Information obtained from Patient. Family History Cancer - Paternal Grandparents, Diabetes - Mother, Heart Disease - Maternal Grandparents, Hypertension - Mother, No family history of Kidney Disease, Lung Disease, Seizures, Stroke, Thyroid Problems, Tuberculosis. Social History Never smoker, Marital Status - Single, Alcohol Use - Never, Drug Use - No History, Caffeine Use - Never. Medical History Hematologic/Lymphatic Patient has history of Lymphedema Respiratory Patient has history of Sleep Apnea [name] [name], [name] [name] ([id number]) [id number] Page 5 of 8 Cardiovascular Patient has history of Hypertension, Peripheral Venous Disease Endocrine Patient has history of Type II Diabetes Integumentary (Skin) Denies history of History of Burn Hospitalization/Surgery History - tracheostomy 2017. Medical A Surgical  History Notes nd Respiratory chronic respiratory failure tracheostomy dependent Cardiovascular diastolic heart failure pericardial effusion pulmonary hypertension Gastrointestinal hepatic congestion dysphagia Endocrine hyperlipidemia, morbid obesity Objective Constitutional Slightly hypertensive. no acute distress. Vitals Time Taken: 1:59 PM, Height: 65 in, Weight: 430 lbs, BMI: 71.5, Temperature: 98.3 F, Pulse: 91 bpm, Respiratory Rate: 20 breaths/min, Blood Pressure: 145/83 mmHg. Respiratory Normal work of breathing on supplemental oxygen. General Notes: 02/28/2023: The remaining open area on her leg [name] extremely superficial and about a millimeter in size. Integumentary (Hair, Skin) Wound #1 status [name] Open. Original cause of wound was Gradually Appeared. The date acquired was: 01/25/2023. The wound has been in treatment 4 weeks. The wound [name] located on the Left,Circumferential Lower Leg. The wound measures 0.1cm length x 0.1cm width x 0.1cm depth; 0.008cm^2 area and 0.001cm^3 volume. There [name] Fat Layer (Subcutaneous Tissue) exposed. There [name] no tunneling  or undermining noted. There [name] a none present amount of drainage noted. The wound margin [name] distinct with the outline attached to the wound base. There [name] large (67-100%) red granulation within the wound bed. There [name] no necrotic tissue within the wound bed. The periwound skin appearance had no abnormalities noted for texture. The periwound skin appearance exhibited: Maceration, Hemosiderin Staining. Periwound temperature was noted as No Abnormality. 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 - Dr Lady Gary - Room 3 Tuesday July 16th at 11:45am Anesthetic: Wound #1 Left,Circumferential Lower Leg: (In clinic) Topical Lidocaine 4% applied to wound bed Bathing/ Shower/ Hygiene: May shower with protection but do not get wound dressing(s) wet. Protect dressing(s) with water repellant cover (for example, large plastic bag) or a cast cover and may then take shower. Edema Control - Lymphedema / SCD / Other: Lymphedema Pumps. Use Lymphedema pumps on leg(s) 2-3 times a day for 45-60 minutes. If wearing any wraps or hose, do not remove them. Continue exercising as instructed. Elevate legs to the level of the heart or above for 30 minutes daily and/or when sitting for 3-4 times a day throughout the day. [name] [name], [name] [name] ([id number]) [id number] Page 6 of 8 Avoid standing for long periods of time. Patient to wear own compression stockings every day. Compression stocking or Garment 20-30 mm/Hg pressure to: - you have stage III lymphedema and will require compression garments (Jobst Rosamaria Lints) for life 3 garments per leg every 6 months. WOUND #1: - Lower Leg Wound Laterality: Left, Circumferential Cleanser: Soap and Water 1 x Per Week/30 Days Discharge Instructions: May shower and wash wound with dial antibacterial soap and water prior to dressing change. Cleanser: Vashe 5.8 (oz) 1 x Per Week/30 Days Discharge Instructions: Cleanse the wound with Vashe prior to applying a clean dressing using gauze sponges, not tissue or cotton balls. Peri-Wound Care: Sween Lotion (Moisturizing lotion) 1 x Per Week/30 Days Discharge Instructions: Apply moisturizing lotion as directed Com pression Wrap: Unnaboot w/Calamine, 4x10 (in/yd) 1 x Per Week/30 Days Discharge Instructions: Apply Unnaboot as directed. Com pression Stockings: Jobst Farrow Wrap 4000 (DME) Compression Amount: 30-40 mmHg (left) Compression  Amount: 30-40 mmHg (right) Discharge Instructions: Apply Renee Pain daily as instructed. Apply first thing in the morning, remove at night before bed. 02/28/2023: The remaining open area on her leg [name] extremely superficial and about a millimeter in size. As she does not yet have compression garments or lymphedema pumps, we will wrap her leg again with 4-layer compression. It seems that [employer] may be on backorder so we will see if she can get juxta lite stockings instead. Due to her severe stage III lymphedema that has not been ameliorated adequately after 30 days of compression and the fact that she [name] unable to exercise due to her morbid obesity and other multiple medical comorbidities, she will need lymphedema pumps for life as well as lifelong use of bilateral compression garments. She will follow-up next week, at which time hopefully she will have compression garments available to her. Electronic Signature(s) Signed: 02/28/2023 2:53:27 PM By: Duanne Guess MD FACS Entered By: Duanne Guess on 02/28/2023 14:53:27 -------------------------------------------------------------------------------- HxROS Details Patient Name: Date of Service: [name] [name], [name] [name] [place] S. 02/28/2023 1:45 PM Medical Record Number: [id number] Patient Account Number: [id number] Date of Birth/Sex: Treating RN: 08/10/1981 (42 y.o. F) Primary Care Provider: Alysia Penna Other Clinician: Referring Provider: Treating Provider/Extender: Sherlynn Stalls in Treatment: 4 Information Obtained From Patient Hematologic/Lymphatic Medical History: Positive for: Lymphedema Respiratory Medical History: Positive for: Sleep Apnea Past Medical History Notes: chronic respiratory failure tracheostomy dependent Cardiovascular Medical History: Positive for: Hypertension; Peripheral Venous Disease Past Medical History Notes: diastolic heart failure pericardial effusion pulmonary  hypertension Gastrointestinal Medical History: Past Medical History Notes: hepatic congestion Templer, Katlen S ([id number]) [id number] Page 7 of 8 dysphagia Endocrine Medical History: Positive for: Type II Diabetes Past Medical History Notes: hyperlipidemia, morbid obesity Time with diabetes: <1 month Treated with: Oral agents Blood sugar tested every day: No Integumentary (Skin) Medical History: Negative for: History of Burn Immunizations Pneumococcal Vaccine: Received Pneumococcal Vaccination: No Implantable Devices None Hospitalization / Surgery History Type of Hospitalization/Surgery tracheostomy 2017 Family and Social History Cancer: Yes - Paternal Grandparents; Diabetes: Yes - Mother; Heart Disease: Yes - Maternal Grandparents; Hypertension: Yes - Mother; Kidney Disease: No; Lung Disease: No; Seizures: No; Stroke: No; Thyroid Problems: No; Tuberculosis: No; Never smoker; Marital Status - Single; Alcohol Use: Never; Drug Use: No History; Caffeine Use: Never; Financial Concerns: No; Food, Clothing or Shelter Needs: No; Support System Lacking: No; Transportation Concerns: No Electronic Signature(s) Signed: 02/28/2023 3:02:25 PM By: Duanne Guess MD FACS Entered By: Duanne Guess on 02/28/2023 14:50:45 -------------------------------------------------------------------------------- SuperBill Details Patient Name: Date of Service: [name] [name], [name] [name] [place] S. 02/28/2023 Medical Record Number: [id number] Patient Account Number: [id number] Date of Birth/Sex: Treating RN: 1981/03/03 (42 y.o. F) Primary Care Provider: Alysia Penna Other Clinician: Referring Provider: Treating Provider/Extender: Sherlynn Stalls in Treatment: 4 Diagnosis Coding ICD-10 Codes Code Description [phone] Non-pressure chronic ulcer of other part of left lower leg with fat layer exposed E11.622 Type 2 diabetes mellitus with other skin ulcer I89.0  Lymphedema, not elsewhere classified I87.2 Venous insufficiency (chronic) (peripheral) I50.32 Chronic diastolic (congestive) heart failure E66.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 Z93.0 Tracheostomy status Z99.81 Dependence on supplemental oxygen Physician Procedures : CPT4 Code Description Modifier MYSTIQUE, SIEN ([id number]) [id number] - WC PHYS LEVEL 3 - EST PT ICD-10 Diagnosis Description L97.822 Non-pressure chronic ulcer of other part of left lower leg with fat layer exposed  E11.622 Type 2 diabetes mellitus with other skin ulcer I89.0 Lymphedema, not elsewhere classified I87.2 Venous insufficiency (chronic) (peripheral) Quantity: [id number] Page 8 of 8 1 Electronic Signature(s) Signed: 02/28/2023 2:53:58 PM By: Duanne Guess MD FACS Entered By: Duanne Guess on 02/28/2023 14:53:58

## 2023-03-01 NOTE — Progress Notes (Signed)
[name] [name] ([id number]) [id number] Page 1 of 5 Visit Report for 02/28/2023 Arrival Information Details Patient Name: Date of Service: [name] [name], [name] [place] [name]. 02/28/2023 1:45 PM Medical Record Number: [id number] Patient Account Number: [id number] Date of Birth/Sex: Treating RN: [name] [name] (42 y.o. [name] [name] Primary Care [name] [name]: [name] [name] Other Clinician: Referring [name] [name]: Treating [name] [name]/Extender: [name] [name] in Treatment: 4 Visit Information History Since Last Visit Added or deleted any medications: No Patient Arrived: Ambulatory Any new allergies or adverse reactions: No Arrival Time: 13:59 Had a fall or experienced change in No Accompanied By: self activities of daily living that may affect Transfer Assistance: None risk of falls: Patient Requires Transmission-Based Precautions: No Signs or symptoms of abuse/neglect since last visito No Patient Has Alerts: No Hospitalized since last visit: No Implantable device outside of the clinic excluding No cellular tissue based products placed in the center since last visit: Has Dressing in Place as Prescribed: Yes Has Compression in Place as Prescribed: Yes Pain Present Now: No Electronic Signature([name]) Signed: 03/01/2023 11:32:43 AM By: [name] Schwalbe RN Entered By: [name] [name] on 02/28/2023 14:03:25 -------------------------------------------------------------------------------- Compression Therapy Details Patient Name: Date of Service: [name] [name], [name] [name]. 02/28/2023 1:45 PM Medical Record Number: [id number] Patient Account Number: [id number] Date of Birth/Sex: Treating RN: [name] [name] (42 y.o. [name] [name] Primary Care [name] [name]: [name] [name] Other Clinician: Referring [name] [name]: Treating [name] [name]/Extender: [name] [name] in Treatment: 4 Compression Therapy Performed for Wound Assessment: Wound #1 Left,Circumferential Lower  Leg Performed By: Clinician [name] Schwalbe, RN Compression Type: Four Layer Post Procedure Diagnosis Same as Pre-procedure Notes Unna first layer then Urgo K2 or 4 layer Electronic Signature([name]) Signed: 03/01/2023 11:32:43 AM By: [name] Schwalbe RN Entered By: [name] [name] on 02/28/2023 15:31:43 [name], [name] Masker ([id number]) [id number] Page 2 of 5 -------------------------------------------------------------------------------- Lower Extremity Assessment Details Patient Name: Date of Service: [name] [name], [name] [place] [name]. 02/28/2023 1:45 PM Medical Record Number: [id number] Patient Account Number: [id number] Date of Birth/Sex: Treating RN: [birth year] (42 y.o. [name] [name] Primary Care [name] [name]: [name] [name] Other Clinician: Referring [name] [name]: Treating [name] [name]: [name] [name] in Treatment: 4 Edema Assessment Assessed: [Left: No] [Right: No] Edema: [Left: Ye] [Right: [name]] Calf Left: Right: Point of Measurement: 32 cm From Medial Instep 64 cm 55.5 cm Ankle Left: Right: Point of Measurement: 11 cm From Medial Instep 29 cm 29 cm Knee To Floor Left: Right: From Medial Instep 39 cm 39 cm Vascular Assessment Pulses: Dorsalis Pedis Palpable: [Left:Yes] Electronic Signature([name]) Signed: 03/01/2023 11:32:43 AM By: [name] Schwalbe RN Entered By: [name] [name] on 02/28/2023 14:17:35 -------------------------------------------------------------------------------- Multi Wound Chart Details Patient Name: Date of Service: [name] [name], [name] [name]. 02/28/2023 1:45 PM Medical Record Number: [id number] Patient Account Number: [id number] Date of Birth/Sex: Treating RN: [name] [name], [name] [name] (42 y.o. F) Primary Care [name] [name]: [name] [name] Other Clinician: Referring [name] [name]: Treating [name] [name]: [name] [name] in Treatment: 4 Vital Signs Height(in): 65 Pulse(bpm): 91 Weight(lbs): 430 Blood Pressure(mmHg):  145/83 Body Mass Index(BMI): 71.5 Temperature(F): 98.3 Respiratory Rate(breaths/min): 20 [1:Photos:] [N/A:N/A] Left, Circumferential Lower Leg N/A N/A Wound Location: Gradually Appeared N/A N/A Wounding Event: Venous Leg Ulcer N/A N/A Primary Etiology: Lymphedema, Sleep Apnea, N/A N/A Comorbid History: Hypertension, Peripheral Venous Disease, Type II Diabetes 01/25/2023 N/A N/A Date Acquired: 4 N/A N/A Weeks of Treatment: Open N/A N/A Wound Status: No N/A N/A Wound Recurrence: 0.1x0.1x0.1 N/A N/A Measurements L x W x D (cm) 0.008 N/A N/A A (cm) : rea  0.001 N/A N/A Volume (cm) : 100.00% N/A N/A % Reduction in Area: 100.00% N/A N/A % Reduction in Volume: Full Thickness Without Exposed N/A N/A Classification: Support Structures None Present N/A N/A Exudate Amount: Distinct, outline attached N/A N/A Wound Margin: Large (67-100%) N/A N/A Granulation Amount: Red N/A N/A Granulation Quality: None Present (0%) N/A N/A Necrotic Amount: Fat Layer (Subcutaneous Tissue): Yes N/A N/A Exposed Structures: Fascia: No Tendon: No Muscle: No Joint: No Bone: No Large (67-100%) N/A N/A Epithelialization: No Abnormalities Noted N/A N/A Periwound Skin Texture: Maceration: Yes N/A N/A Periwound Skin Moisture: Hemosiderin Staining: Yes N/A N/A Periwound Skin Color: No Abnormality N/A N/A Temperature: Treatment Notes Electronic Signature([name]) Signed: 02/28/2023 2:49:46 PM By: [name] Guess MD FACS Entered By: [name] [name] on 02/28/2023 14:49:46 -------------------------------------------------------------------------------- Pain Assessment Details Patient Name: Date of Service: [name] [name], [name] [name]. 02/28/2023 1:45 PM Medical Record Number: [id number] Patient Account Number: [id number] Date of Birth/Sex: Treating RN: [birth year], [name] [name] (42 y.o. [name] [name] Primary Care Tillie Viverette: [name] [name] Other Clinician: Referring Ladana Chavero: Treating [name] [name]: [name] [name] in Treatment: 4 Active Problems Location of Pain Severity and Description of Pain Patient Has Paino No Site Locations [name] [name] ([id number]) [id number] Page 4 of 5 Pain Management and Medication Current Pain Management: Electronic Signature([name]) Signed: 03/01/2023 11:32:43 AM By: [name] Schwalbe RN Entered By: [name] [name] on 02/28/2023 14:03:42 -------------------------------------------------------------------------------- Wound Assessment Details Patient Name: Date of Service: [name] [name], [name] [name]. 02/28/2023 1:45 PM Medical Record Number: [id number] Patient Account Number: [id number] Date of Birth/Sex: Treating RN: [birth year] (42 y.o. [name] [name] Primary Care Kiko Ripp: [name] [name] Other Clinician: Referring Shannette Tabares: Treating [name] [name]: [name] [name] in Treatment: 4 Wound Status Wound Number: 1 Primary Venous Leg Ulcer Etiology: Wound Location: Left, Circumferential Lower Leg Wound Open Wounding Event: Gradually Appeared Status: Date Acquired: 01/25/2023 Comorbid Lymphedema, Sleep Apnea, Hypertension, Peripheral Venous Weeks Of Treatment: 4 History: Disease, Type II Diabetes Clustered Wound: No Photos Wound Measurements Length: (cm) 0.1 Width: (cm) 0.1 Depth: (cm) 0.1 Area: (cm) 0.008 Volume: (cm) 0.001 % Reduction in Area: 100% % Reduction in Volume: 100% Epithelialization: Large (67-100%) Tunneling: No Undermining: No Wound Description Classification: Full Thickness Without Exposed Suppor Parzych, Tkai [name] ([id number]) Wound Margin: Distinct, outline attached Exudate Amount: None Present t Structures Foul Odor After Cleansing: No [phone].pdf Page 5 of 5 Slough/Fibrino No Wound Bed Granulation Amount: Large (67-100%) Exposed Structure Granulation Quality: Red Fascia Exposed: No Necrotic Amount: None Present (0%) Fat Layer  (Subcutaneous Tissue) Exposed: Yes Tendon Exposed: No Muscle Exposed: No Joint Exposed: No Bone Exposed: No Periwound Skin Texture Texture Color No Abnormalities Noted: Yes No Abnormalities Noted: No Hemosiderin Staining: Yes Moisture No Abnormalities Noted: No Temperature / Pain Maceration: Yes Temperature: No Abnormality Electronic Signature([name]) Signed: 03/01/2023 11:32:43 AM By: [name] Schwalbe RN Entered By: [name] [name] on 02/28/2023 14:21:18 -------------------------------------------------------------------------------- Vitals Details Patient Name: Date of Service: [name] [name], [name] [name]. 02/28/2023 1:45 PM Medical Record Number: [id number] Patient Account Number: [id number] Date of Birth/Sex: Treating RN: [birth year], [name] [name] (42 y.o. [name] [name] Primary Care Sharlena Kristensen: [name] [name] Other Clinician: Referring Makenlee Mckeag: Treating Eiley Mcginnity/Extender: [name] [name] in Treatment: 4 Vital Signs Time Taken: 13:59 Temperature (F): 98.3 Height (in): 65 Pulse (bpm): 91 Weight (lbs): 430 Respiratory Rate (breaths/min): 20 Body Mass Index (BMI): 71.5 Blood Pressure (mmHg): 145/83 Reference Range: 80 - 120 mg / dl Electronic Signature([name]) Signed: 03/01/2023 11:32:43 AM By: [name] Schwalbe RN Entered By: [name] [name]  on 02/28/2023 14:03:35

## 2023-03-07 NOTE — Progress Notes (Signed)
Cardiology Office Note:  .   Date:  03/09/2023  ID:  [name] [name], DOB Aug 01, 1981, MRN [id number] PCP: Anne Ng, NP  [facility] Providers Cardiologist:  Donato Schultz, MD    Patient Profile: .      PMH Hypertension Normal renal dopplers 2012 OSA Morbid obesity Respiratory failure in setting of hypertensive urgency and pulmonary edema requiring intubation Tracheostomy due to prolonged respiratory failure On chronic O2 Pulmonary hypertension PASP 42 CKD Stage IV Chronic HFpEF  Initially presented in 2012 with malignant hypertension.  She had a hospitalization for significant hypertension and pulmonary edema and was ultimately intubated.  She underwent tracheostomy due to prolonged respiratory failure with concern for sinus pauses while fighting against ventilator, not PPM candidate per EP. Follow-up was not consistent until 2018.   Most recent echocardiogram 08/07/2019 with LVEF 60 to 65%, no LVH, no RWMA, RV ok, no significant valve disease.   Last cardiology clinic visit was 05/06/2022 with Robin Searing, NP.  BP was mildly elevated at 156/89 and 154/96.  She reported home BP 130s over 80s.  She is being followed by [facility] for weight loss. She did not have any specific cardiac concerns and 6 month follow-up was recommended.         History of Present Illness: .   [name] [name] is a very pleasant 42 y.o. female who is here today for follow-up. BP is elevated today. She reports BP at frequent wound care appointments has been mostly 130s. She took BP medicine today on the way here. She continues to struggle with weight loss. Stopped going to [employer] [facility]. Is working on portion control and considering bariatric surgery. She has chronic shortness of breath and lower extremity edema but no change in symptoms that are concerning to her. She denies chest pain, palpitations, presyncope, syncope, orthopnea, and PND.  ROS: See  HPI       Studies Reviewed: Marland Kitchen   EKG Interpretation Date/Time:  Wednesday March 09 2023 13:47:29 EDT Ventricular Rate:  85 PR Interval:  228 QRS Duration:  122 QT Interval:  396 QTC Calculation: 471 R Axis:   166  Text Interpretation: Sinus rhythm with 1st degree A-V block Right bundle branch block Septal infarct , age undetermined When compared with ECG of 06-Aug-2019 18:53, PREVIOUS ECG IS PRESENT No acute changes Confirmed by Eligha Bridegroom [phone]) on 03/09/2023 4:15:43 PM     Risk Assessment/Calculations:     HYPERTENSION CONTROL Vitals:   03/09/23 1344 03/09/23 1619  BP: (!) 163/98 (!) 145/90    The patient's blood pressure is elevated above target today.  In order to address the patient's elevated BP: Blood pressure will be monitored at home to determine if medication changes need to be made.          Physical Exam:   VS:  BP (!) 145/90   Pulse 85   Ht 5\' 5"  (1.651 m)   Wt (!) 432 lb (196 kg)   SpO2 95%   BMI 71.89 kg/m    Wt Readings from Last 3 Encounters:  03/09/23 (!) 432 lb (196 kg)  02/10/23 (!) 423 lb 6.4 oz (192.1 kg)  01/06/23 (!) 441 lb (200 kg)    GEN: Obese, well developed in no acute distress NECK: No JVD; No carotid bruits CARDIAC: RRR, no murmurs, rubs, gallops RESPIRATORY:  Trach collar in place; Clear to auscultation without rales, wheezing or rhonchi  ABDOMEN: Soft, non-tender, non-distended EXTREMITIES:  Significant  bilateral edema x 4 extremities    ASSESSMENT AND PLAN: .    Hypertension: BP is elevated today.  She reports typical BP readings in the 130s at wound center.  PCP recently added lisinopril for renal protection due to abnormal urine microalbumin.  Advised her to report to PCP if BP consistently > 140/80. No medication changes today. She reports renal function has been stable.   Hyperlipidemia: LDL 56 on 01/06/23. Continue atorvastatin.   Chronic HFpEF: Normal LVEF on echo 07/2019. Volume status is difficult to assess due to  body habitus. She does not report any significant changes in respiratory status. She denies dyspnea, orthopnea, PND. Feels that edema is stable.   Obesity: BMI > 70.  She had some successful weight loss a few months ago but has gained some of that back.  Reports that she struggles but does not provide specifics. Is working on eating portion sizes recommended following bariatric surgery.  She is on Rybelsus. Encouragement provided.   Chronic respiratory failure: On chronic O2. Tracheostomy in place since 2017. No acute concerns today.  She continues to follow with trach clinic.       Dispo: 6 months with Dr. Anne Fu or APP  Signed, Eligha Bridegroom, NP-C

## 2023-03-08 ENCOUNTER — Ambulatory Visit (HOSPITAL_BASED_OUTPATIENT_CLINIC_OR_DEPARTMENT_OTHER): Payer: Medicare Other | Admitting: General Surgery

## 2023-03-09 ENCOUNTER — Ambulatory Visit: Payer: Medicare Other | Attending: Cardiology | Admitting: Nurse Practitioner

## 2023-03-09 ENCOUNTER — Encounter: Payer: Self-pay | Admitting: Nurse Practitioner

## 2023-03-09 VITALS — BP 145/90 | HR 85 | Ht 65.0 in | Wt >= 6400 oz

## 2023-03-09 DIAGNOSIS — I5032 Chronic diastolic (congestive) heart failure: Secondary | ICD-10-CM | POA: Diagnosis present

## 2023-03-09 DIAGNOSIS — J9611 Chronic respiratory failure with hypoxia: Secondary | ICD-10-CM | POA: Diagnosis present

## 2023-03-09 DIAGNOSIS — N189 Chronic kidney disease, unspecified: Secondary | ICD-10-CM

## 2023-03-09 DIAGNOSIS — I10 Essential (primary) hypertension: Secondary | ICD-10-CM | POA: Diagnosis present

## 2023-03-09 DIAGNOSIS — E785 Hyperlipidemia, unspecified: Secondary | ICD-10-CM | POA: Diagnosis present

## 2023-03-09 DIAGNOSIS — E1169 Type 2 diabetes mellitus with other specified complication: Secondary | ICD-10-CM | POA: Insufficient documentation

## 2023-03-09 DIAGNOSIS — J9612 Chronic respiratory failure with hypercapnia: Secondary | ICD-10-CM | POA: Insufficient documentation

## 2023-03-09 NOTE — Patient Instructions (Signed)
Medication Instructions:  Your physician recommends that you continue on your current medications as directed. Please refer to the Current Medication list given to you today.  *If you need a refill on your cardiac medications before your next appointment, please call your pharmacy*   Lab Work: None  If you have labs (blood work) drawn today and your tests are completely normal, you will receive your results only by: MyChart Message (if you have MyChart) OR A paper copy in the mail If you have any lab test that is abnormal or we need to change your treatment, we will call you to review the results.   Testing/Procedures: None   Follow-Up: At [facility], you and your health needs are our priority.  As part of our continuing mission to provide you with exceptional heart care, we have created designated Provider Care Teams.  These Care Teams include your primary Cardiologist (physician) and Advanced Practice Providers (APPs -  Physician Assistants and Nurse Practitioners) who all work together to provide you with the care you need, when you need it.  We recommend signing up for the patient portal called "MyChart".  Sign up information is provided on this After Visit Summary.  MyChart is used to connect with patients for Virtual Visits (Telemedicine).  Patients are able to view lab/test results, encounter notes, upcoming appointments, etc.  Non-urgent messages can be sent to your provider as well.   To learn more about what you can do with MyChart, go to [email or web address].    Your next appointment:   6 month(s)  Provider:   Donato Schultz, MD

## 2023-03-14 ENCOUNTER — Encounter (HOSPITAL_BASED_OUTPATIENT_CLINIC_OR_DEPARTMENT_OTHER): Payer: Medicare Other | Admitting: General Surgery

## 2023-03-14 DIAGNOSIS — E11622 Type 2 diabetes mellitus with other skin ulcer: Secondary | ICD-10-CM | POA: Diagnosis not present

## 2023-03-15 NOTE — Progress Notes (Addendum)
03/14/2023 9:00 A M Medical Record Number: [id number] Patient Account Number: [id number] Date of Birth/Sex: Treating RN: 26-Jun-1981 (42 y.o. 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 - Dr. Lady [name] RM 3 Return A Bathing/ Shower/ Hygiene May shower and wash wound with soap and water. Edema Control - Lymphedema / SCD / Other Bilateral Lower Extremities Lymphedema Pumps. Use Lymphedema pumps on leg(s) 2-3 times a day for 45-60 minutes. If wearing any wraps or hose, do not remove them. Continue exercising as instructed. - When available Elevate legs to the level of the heart or above for 30 minutes daily and/or when sitting for 3-4 times a day throughout the day. Avoid standing for long periods of time. Exercise regularly [name] [name], [name] [name] ([id number]) [id number] Page 3 of  7 Compression stocking or Garment 20-30 mm/Hg pressure to: - you have stage III lymphedema and will require compression garments (Jobst Rosamaria Lints) for life 3 garments per leg every 6 months. Electronic Signature(s) Signed: 03/21/2023 10:26:58 AM By: [name] Guess MD FACS Previous Signature: 03/14/2023 11:13:49 AM Version By: [name] Guess MD FACS Previous Signature: 03/14/2023 6:34:47 PM Version By: [name] Deed RN, BSN Entered By: [name] [name] on 03/21/2023 06:50:08 -------------------------------------------------------------------------------- Problem List Details Patient Name: Date of Service: [name] [name], [name] 03/14/2023 9:00 A M Medical Record Number: [id number] Patient Account Number: [id number] Date of Birth/Sex: Treating RN: June 04, 1981 (42 y.o. [name] [name] Primary Care Provider: Alysia [name] Other Clinician: Referring Provider: Treating Provider/Extender: [name] [name] in Treatment: 6 Active Problems ICD-10 Encounter Code Description Active Date MDM Diagnosis [phone] Non-pressure chronic ulcer of other part of left lower leg with fat layer exposed6/11/2022 No Yes E11.622 Type 2 diabetes mellitus with other skin ulcer 01/25/2023 No Yes I89.0 Lymphedema, not elsewhere classified 01/25/2023 No Yes I87.2 Venous insufficiency (chronic) (peripheral) 01/25/2023 No Yes I50.32 Chronic diastolic (congestive) heart failure 01/25/2023 No Yes E66.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 01/25/2023 No Yes Z93.0 Tracheostomy status 01/25/2023 No Yes Z99.81 Dependence on supplemental oxygen 01/25/2023 No Yes Inactive Problems Resolved Problems Electronic Signature(s) Signed: 03/21/2023 9:49:04 AM By: [name] Guess MD FACS Previous Signature: 03/14/2023 11:57:01 AM Version By: [name] Guess MD FACS Previous Signature: 03/14/2023 11:13:49 AM Version By: [name] Guess MD FACS Entered By: [name] [name] on 03/21/2023  06:49:04 [name] [id number] Page 4 of 7 -------------------------------------------------------------------------------- Progress Note Details Patient Name: Date of Service: [name] [name], [name] [place] S. 03/14/2023 9:00 A M Medical Record Number: [id number] Patient Account Number: [id number] Date of Birth/Sex: Treating RN: [birth year] (42 y.o. 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 - Dr. Lady [name] RM 3 Bathing/ Shower/ Hygiene: May shower and wash wound with soap and water. Edema Control - Lymphedema / SCD / Other: Lymphedema Pumps. Use Lymphedema pumps on leg(s) 2-3 times a day for 45-60 minutes. If wearing any wraps or hose, do not remove them. Continue exercising as instructed. - When available Elevate legs to the level of the heart or above for 30 minutes daily and/or when sitting for 3-4 times a day throughout the day. Avoid standing for long periods of time. Exercise regularly Compression stocking or Garment 20-30 mm/Hg  pressure to: - you have stage III lymphedema and will require compression garments (Jobst Rosamaria Lints) for life 3 garments per leg every 6 months. Wear farrow wraps religiously. Consistent leg elevation. Follow up in 2 weeks to facilitate pump approval. Electronic Signature(s) Signed: 03/21/2023 9:50:29 AM By: [name] Guess MD FACS Previous Signature: 03/18/2023 4:29:54 PM Version By: Thayer Dallas Entered By: [name] [name] on 03/21/2023 06:50:29 [name], [name] [id number] Page 6 of 7 -------------------------------------------------------------------------------- HxROS Details Patient Name: Date of Service: [name] [name], [name] [place] S. 03/14/2023 9:00 A M Medical Record Number: [id number] Patient Account Number: [id number] Date of Birth/Sex: Treating RN: [birth year] (42 y.o. [name] [name] Primary Care Provider: Alysia [name] Other Clinician: Referring Provider: Treating Provider/Extender: [name] [name] in Treatment: 6 Information Obtained From Patient Hematologic/Lymphatic Medical History: Positive for: Lymphedema Respiratory Medical History: Positive for: Sleep Apnea Past Medical History Notes: chronic respiratory failure tracheostomy dependent Cardiovascular Medical History: Positive for: Hypertension; Peripheral Venous Disease Past Medical History Notes: diastolic heart failure pericardial effusion pulmonary hypertension Gastrointestinal Medical History: Past Medical History Notes: hepatic congestion dysphagia Endocrine Medical History: Positive for: Type II Diabetes Past Medical History Notes: hyperlipidemia, morbid obesity Time with diabetes: <1 month Treated with: Oral agents Blood sugar tested every day: No Integumentary (Skin) Medical History: Negative for: History of Burn Immunizations Pneumococcal Vaccine: Received Pneumococcal Vaccination: No Implantable  Devices None Hospitalization / Surgery History Type of Hospitalization/Surgery tracheostomy 2017 Family and Social History Cancer: Yes - Paternal Grandparents; Diabetes: Yes - Mother; Heart Disease: Yes - Maternal Grandparents; Hypertension: Yes - Mother; Kidney Disease: No; Lung Disease: No; Seizures: No; Stroke: No; Thyroid Problems: No; Tuberculosis: No; Never smoker; Marital Status - Single; Alcohol Use: Never; Drug IVANNA, WIEMER ([id number]) [id number] Page 7 of 7 Use: No History; Caffeine Use: Never; Financial Concerns: No; Food, Clothing or Shelter Needs: No; Support System Lacking: No; Transportation Concerns: No Electronic Signature(s) Signed: 03/21/2023 10:26:58 AM By: [name] Guess MD FACS Signed: 05/09/2023 4:46:25 PM By: [name] Deed RN, BSN Entered By: [name] [name] on 03/21/2023 06:49:30 -------------------------------------------------------------------------------- SuperBill Details Patient Name: Date of Service: [name] [name], [name] 03/14/2023 Medical Record Number: [id number] Patient Account Number: [id number] Date of Birth/Sex: Treating RN: 12/24/1980 (43 y.o. 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 03/14/2023 9:00 A M Medical Record Number: [id number] Patient Account Number: [id number] Date of Birth/Sex: Treating RN: [birth year] (42 y.o. [name] [name] Primary Care Provider: Alysia [name] Other Clinician: Referring Provider: Treating Provider/Extender: [name] [name] in Treatment: 6 Information Obtained from: Patient Chief Complaint Patient presents for treatment of an open ulcer due to venous insufficiency in the setting of severe stage 3 lymphedema Electronic Signature(s) Signed: 03/21/2023 9:49:17 AM By: [name] Guess MD FACS Entered By: [name] [name] on 03/21/2023 06:49:16 -------------------------------------------------------------------------------- HPI Details Patient Name: Date of Service: [name], [name] 03/14/2023 9:00 A M Medical Record Number: [id number] Patient Account Number: [id number] Date of Birth/Sex: Treating RN: 12-Jul-1981 (42 y.o. [name] [name] Primary Care Provider: Alysia [name] Other Clinician: Referring Provider: Treating Provider/Extender: [name] [name] in Treatment: 6 History of Present Illness HPI Description: ADMISSION 01/25/2023 This is a 42 year old type II diabetic (last hemoglobin A1c 6.9%) who is super morbidly obese and relies on a tracheostomy as well as oxygen via nasal cannula during the day. She has congestive heart failure and the skin changes in her legs are consistent with lymphedema. She was referred to [place] for venous stasis dermatitis however she has circumferential breakdown of the skin on her left leg. There is thick slough formation. ABIs were noncompressible, secondary to the lymphedema, but she does have a palpable dorsalis pedis pulse. 02/01/2023: The wound is covered  with a thick layer of yellowish-gray slimy slough and has a strong putrid odor. 02/10/2023: The wound is less malodorous today and has less slime on the surface, but there is still slough present and a persistent odor. The culture that I took last week was polymicrobial with predominant species including Morganella and a plethora of skin flora. Both Augmentin and Bactrim were recommended as treatment in combination and these were prescribed. Unfortunately, she is unable to afford [facility] topical antibiotic compound. She is tolerating her oral therapy well, however. 02/16/2023: The odor has completely abated and the wound surfaces are clean and dry. There is just a very small remaining open area on her posteromedial leg. She does not have compression garments, nor does she have lymphedema pumps. 02/28/2023: The remaining open area on her posteromedial leg is down to around a millimeter. Still no word regarding her lymphedema pumps or compression garments. 03/14/23: Healed, has farrow wraps with her today. Waiting 30 days for pumps. Electronic Signature(s) Signed: 03/21/2023 9:49:25 AM By: [name] Guess MD FACS Previous Signature: 03/18/2023 4:29:54 PM Version By: Thayer Dallas Entered By: [name] [name] on 03/21/2023 06:49:25 Achor, [name] [name] ([id number]) [id number] Page 2 of 7 -------------------------------------------------------------------------------- Physical Exam Details Patient Name: Date of Service: [name] [name], [name] [place] S. 03/14/2023 9:00 A M Medical Record Number: [id number] Patient Account Number: [id number] Date of Birth/Sex: Treating RN: 09/06/1980 (42 y.o. [name] [name] Primary Care Provider: Alysia [name] Other Clinician: Referring Provider: Treating Provider/Extender: [name] [name] in Treatment: 6 Constitutional Hypertensive, asymptomatic. . . . no acute distress. Respiratory Normal work of breathing on supplemental  oxygen. Notes 03/14/23: Wounds healed, edema control good. Electronic Signature(s) Signed: 03/21/2023 9:49:56 AM By: [name] Guess MD FACS Previous Signature: 03/18/2023 4:29:54 PM Version By: Thayer Dallas Entered By: [name] [name] on 03/21/2023 06:49:56 -------------------------------------------------------------------------------- Physician Orders Details Patient Name: Date of Service: DA V IS, [name]  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 - Dr. Lady [name] RM 3 Bathing/ Shower/ Hygiene: May shower and wash wound with soap and water. Edema Control - Lymphedema / SCD / Other: Lymphedema Pumps. Use Lymphedema pumps on leg(s) 2-3 times a day for 45-60 minutes. If wearing any wraps or hose, do not remove them. Continue exercising as instructed. - When available Elevate legs to the level of the heart or above for 30 minutes daily and/or when sitting for 3-4 times a day throughout the day. Avoid standing for long periods of time. Exercise regularly Compression stocking or Garment 20-30 mm/Hg  pressure to: - you have stage III lymphedema and will require compression garments (Jobst Rosamaria Lints) for life 3 garments per leg every 6 months. Wear farrow wraps religiously. Consistent leg elevation. Follow up in 2 weeks to facilitate pump approval. Electronic Signature(s) Signed: 03/21/2023 9:50:29 AM By: [name] Guess MD FACS Previous Signature: 03/18/2023 4:29:54 PM Version By: Thayer Dallas Entered By: [name] [name] on 03/21/2023 06:50:29 [name], [name] [id number] Page 6 of 7 -------------------------------------------------------------------------------- HxROS Details Patient Name: Date of Service: [name] [name], [name] [place] S. 03/14/2023 9:00 A M Medical Record Number: [id number] Patient Account Number: [id number] Date of Birth/Sex: Treating RN: [birth year] (42 y.o. [name] [name] Primary Care Provider: Alysia [name] Other Clinician: Referring Provider: Treating Provider/Extender: [name] [name] in Treatment: 6 Information Obtained From Patient Hematologic/Lymphatic Medical History: Positive for: Lymphedema Respiratory Medical History: Positive for: Sleep Apnea Past Medical History Notes: chronic respiratory failure tracheostomy dependent Cardiovascular Medical History: Positive for: Hypertension; Peripheral Venous Disease Past Medical History Notes: diastolic heart failure pericardial effusion pulmonary hypertension Gastrointestinal Medical History: Past Medical History Notes: hepatic congestion dysphagia Endocrine Medical History: Positive for: Type II Diabetes Past Medical History Notes: hyperlipidemia, morbid obesity Time with diabetes: <1 month Treated with: Oral agents Blood sugar tested every day: No Integumentary (Skin) Medical History: Negative for: History of Burn Immunizations Pneumococcal Vaccine: Received Pneumococcal Vaccination: No Implantable  Devices None Hospitalization / Surgery History Type of Hospitalization/Surgery tracheostomy 2017 Family and Social History Cancer: Yes - Paternal Grandparents; Diabetes: Yes - Mother; Heart Disease: Yes - Maternal Grandparents; Hypertension: Yes - Mother; Kidney Disease: No; Lung Disease: No; Seizures: No; Stroke: No; Thyroid Problems: No; Tuberculosis: No; Never smoker; Marital Status - Single; Alcohol Use: Never; Drug IVANNA, WIEMER ([id number]) [id number] Page 7 of 7 Use: No History; Caffeine Use: Never; Financial Concerns: No; Food, Clothing or Shelter Needs: No; Support System Lacking: No; Transportation Concerns: No Electronic Signature(s) Signed: 03/21/2023 10:26:58 AM By: [name] Guess MD FACS Signed: 05/09/2023 4:46:25 PM By: [name] Deed RN, BSN Entered By: [name] [name] on 03/21/2023 06:49:30 -------------------------------------------------------------------------------- SuperBill Details Patient Name: Date of Service: [name] [name], [name] 03/14/2023 Medical Record Number: [id number] Patient Account Number: [id number] Date of Birth/Sex: Treating RN: 12/24/1980 (43 y.o. 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 03/14/2023 9:00 A M Medical Record Number: [id number] Patient Account Number: [id number] Date of Birth/Sex: Treating RN: [birth year] (42 y.o. [name] [name] Primary Care Provider: Alysia [name] Other Clinician: Referring Provider: Treating Provider/Extender: [name] [name] in Treatment: 6 Information Obtained from: Patient Chief Complaint Patient presents for treatment of an open ulcer due to venous insufficiency in the setting of severe stage 3 lymphedema Electronic Signature(s) Signed: 03/21/2023 9:49:17 AM By: [name] Guess MD FACS Entered By: [name] [name] on 03/21/2023 06:49:16 -------------------------------------------------------------------------------- HPI Details Patient Name: Date of Service: [name], [name] 03/14/2023 9:00 A M Medical Record Number: [id number] Patient Account Number: [id number] Date of Birth/Sex: Treating RN: 12-Jul-1981 (42 y.o. [name] [name] Primary Care Provider: Alysia [name] Other Clinician: Referring Provider: Treating Provider/Extender: [name] [name] in Treatment: 6 History of Present Illness HPI Description: ADMISSION 01/25/2023 This is a 42 year old type II diabetic (last hemoglobin A1c 6.9%) who is super morbidly obese and relies on a tracheostomy as well as oxygen via nasal cannula during the day. She has congestive heart failure and the skin changes in her legs are consistent with lymphedema. She was referred to [place] for venous stasis dermatitis however she has circumferential breakdown of the skin on her left leg. There is thick slough formation. ABIs were noncompressible, secondary to the lymphedema, but she does have a palpable dorsalis pedis pulse. 02/01/2023: The wound is covered  with a thick layer of yellowish-gray slimy slough and has a strong putrid odor. 02/10/2023: The wound is less malodorous today and has less slime on the surface, but there is still slough present and a persistent odor. The culture that I took last week was polymicrobial with predominant species including Morganella and a plethora of skin flora. Both Augmentin and Bactrim were recommended as treatment in combination and these were prescribed. Unfortunately, she is unable to afford [facility] topical antibiotic compound. She is tolerating her oral therapy well, however. 02/16/2023: The odor has completely abated and the wound surfaces are clean and dry. There is just a very small remaining open area on her posteromedial leg. She does not have compression garments, nor does she have lymphedema pumps. 02/28/2023: The remaining open area on her posteromedial leg is down to around a millimeter. Still no word regarding her lymphedema pumps or compression garments. 03/14/23: Healed, has farrow wraps with her today. Waiting 30 days for pumps. Electronic Signature(s) Signed: 03/21/2023 9:49:25 AM By: [name] Guess MD FACS Previous Signature: 03/18/2023 4:29:54 PM Version By: Thayer Dallas Entered By: [name] [name] on 03/21/2023 06:49:25 Achor, [name] [name] ([id number]) [id number] Page 2 of 7 -------------------------------------------------------------------------------- Physical Exam Details Patient Name: Date of Service: [name] [name], [name] [place] S. 03/14/2023 9:00 A M Medical Record Number: [id number] Patient Account Number: [id number] Date of Birth/Sex: Treating RN: 09/06/1980 (42 y.o. [name] [name] Primary Care Provider: Alysia [name] Other Clinician: Referring Provider: Treating Provider/Extender: [name] [name] in Treatment: 6 Constitutional Hypertensive, asymptomatic. . . . no acute distress. Respiratory Normal work of breathing on supplemental  oxygen. Notes 03/14/23: Wounds healed, edema control good. Electronic Signature(s) Signed: 03/21/2023 9:49:56 AM By: [name] Guess MD FACS Previous Signature: 03/18/2023 4:29:54 PM Version By: Thayer Dallas Entered By: [name] [name] on 03/21/2023 06:49:56 -------------------------------------------------------------------------------- Physician Orders Details Patient Name: Date of Service: DA V IS, [name]

## 2023-03-15 NOTE — Progress Notes (Addendum)
1 Primary Venous Leg Ulcer Etiology: Wound Location: Left, Circumferential Lower Leg Wound Healed - Epithelialized Wounding Event: Gradually Appeared Status: Date Acquired: 01/25/2023 Comorbid Lymphedema, Sleep Apnea, Hypertension, Peripheral Venous Weeks Of Treatment: 6 History: Disease, Type II Diabetes Clustered Wound: No Photos Wound Measurements Length: (cm) Width: (cm) Depth: (cm) Area: (cm) Volume: (cm) 0 % Reduction in Area: 100% 0 % Reduction in Volume: 100% 0 Epithelialization:  Large (67-100%) 0 Tunneling: No 0 Undermining: No Wound Description Classification: Full Thickness Without Exposed Suppor Wound Margin: Distinct, outline attached Exudate Amount: None Present t Structures Foul Odor After Cleansing: No Slough/Fibrino No Wound Bed Granulation Amount: None Present (0%) Exposed Structure Necrotic Amount: None Present (0%) Fascia Exposed: No Fat Layer (Subcutaneous Tissue) Exposed: No Tendon Exposed: No Muscle Exposed: No Joint Exposed: No Bone Exposed: No Periwound Skin Texture Texture Color No Abnormalities Noted: Yes No Abnormalities Noted: No Hemosiderin Staining: Yes Moisture No Abnormalities Noted: Yes Temperature / Pain Temperature: No Abnormality Electronic Signature(s) Signed: 03/14/2023 6:34:47 PM By: Zenaida Deed RN, BSN Entered By: Zenaida Deed on 03/14/2023 07:00:44 -------------------------------------------------------------------------------- Vitals Details Patient Name: Date of Service: [name] [name], TIFFA [place] S. 03/14/2023 9:00 A M Medical Record Number: [id number] Patient Account Number: [id number] Date of Birth/Sex: Treating RN: May 18, 1981 (42 y.o. F) Primary Care Raeanna Soberanes: Alysia Penna Other Clinician: Referring Shronda Boeh: Treating Kaleb Linquist/Extender: Sherlynn Stalls in Treatment: 6 Dowers, [name] ([id number]) [id number] Page 8 of 8 Vital Signs Time Taken: 09:40 Temperature (F): 98.2 Height (in): 65 Pulse (bpm): 85 Weight (lbs): 430 Respiratory Rate (breaths/min): 20 Body Mass Index (BMI): 71.5 Blood Pressure (mmHg): 157/82 Reference Range: 80 - 120 mg / dl Electronic Signature(s) Signed: 03/14/2023 6:34:47 PM By: Zenaida Deed RN, BSN Entered By: Zenaida Deed on 03/14/2023 06:54:30  Vital Signs Height(in): 65 Pulse(bpm): 85 Weight(lbs): 430 Blood Pressure(mmHg): 157/82 Body Mass Index(BMI): 71.5 Temperature(F): 98.2 Respiratory Rate(breaths/min): 20 [1:Photos:] [N/A:N/A] Left, Circumferential Lower Leg N/A N/A Wound Location: Gradually Appeared N/A N/A Wounding Event: Venous Leg Ulcer N/A N/A Primary Etiology: Lymphedema, Sleep Apnea, N/A N/A Comorbid History: Hypertension, Peripheral Venous Disease, Type II Diabetes 01/25/2023 N/A N/A Date Acquired: 6 N/A N/A Weeks of Treatment: Healed - Epithelialized N/A N/A Wound Status: No N/A N/A Wound Recurrence: 0x0x0 N/A N/A Measurements L x W x D (cm) 0 N/A N/A A (cm) : rea 0 N/A N/A Volume (cm) : 100.00% N/A N/A % Reduction in Area: 100.00% N/A N/A % Reduction in Volume: Full Thickness Without Exposed N/A N/A Classification: Support Structures None Present N/A N/A Exudate Amount: Distinct, outline attached N/A N/A Wound Margin: None Present (0%) N/A N/A Granulation Amount: None Present (0%) N/A N/A Necrotic Amount: Fascia: No N/A N/A Exposed Structures: Fat Layer (Subcutaneous Tissue): No Tendon: No Muscle: No Joint: No Bone: No Large (67-100%) N/A N/A Epithelialization: Rubenstein, Mandi S ([id number]) [id number] Page 5 of 8 No  Abnormalities Noted N/A N/A Periwound Skin Texture: Maceration: Yes N/A N/A Periwound Skin Moisture: Hemosiderin Staining: Yes N/A N/A Periwound Skin Color: No Abnormality N/A N/A Temperature: Treatment Notes Electronic Signature(s) Signed: 03/21/2023 9:49:10 AM By: Duanne Guess MD FACS Signed: 05/09/2023 4:46:25 PM By: Zenaida Deed RN, BSN Entered By: Duanne Guess on 03/21/2023 06:49:10 -------------------------------------------------------------------------------- Multi-Disciplinary Care Plan Details Patient Name: Date of Service: [name] [name], TIFFA [place] S. 03/14/2023 9:00 A M Medical Record Number: [id number] Patient Account Number: [id number] Date of Birth/Sex: Treating RN: December 15, 1980 (42 y.o. [name] [name] Primary Care Aneira Cavitt: Alysia Penna Other Clinician: Referring Gladyce Mcray: Treating Layanna Charo/Extender: Sherlynn Stalls in Treatment: 6 Active Inactive Electronic Signature(s) Signed: 03/14/2023 6:34:47 PM By: Zenaida Deed RN, BSN Entered By: Zenaida Deed on 03/14/2023 07:01:29 -------------------------------------------------------------------------------- Pain Assessment Details Patient Name: Date of Service: [name] [name], TIFFA [place] S. 03/14/2023 9:00 A M Medical Record Number: [id number] Patient Account Number: [id number] Date of Birth/Sex: Treating RN: 1981-08-12 (42 y.o. F) Primary Care Aminata Buffalo: Alysia Penna Other Clinician: Referring Kayden Hutmacher: Treating Dmarion Perfect/Extender: Sherlynn Stalls in Treatment: 6 Active Problems Location of Pain Severity and Description of Pain Patient Has Paino No Site Locations Rate the pain. ORIANE, WIELGUS ([id number]) [id number] Page 6 of 8 Rate the pain. Current Pain Level: 0 Pain Management and Medication Current Pain Management: Electronic Signature(s) Signed: 03/14/2023 6:34:47 PM By: Zenaida Deed RN, BSN Entered By: Zenaida Deed on  03/14/2023 06:54:42 -------------------------------------------------------------------------------- Patient/Caregiver Education Details Patient Name: Date of Service: [name], TIFFA [place] Kathie Rhodes 7/22/2024andnbsp9:00 A M Medical Record Number: [id number] Patient Account Number: [id number] Date of Birth/Gender: Treating RN: Nov 23, 1980 (42 y.o. [name] [name] Primary Care Physician: Alysia Penna Other Clinician: Referring Physician: Treating Physician/Extender: Sherlynn Stalls in Treatment: 6 Education Assessment Education Provided To: Patient Education Topics Provided Venous: Methods: Explain/Verbal Responses: Reinforcements needed, State content correctly Electronic Signature(s) Signed: 03/14/2023 6:34:47 PM By: Zenaida Deed RN, BSN Entered By: Zenaida Deed on 03/14/2023 07:02:11 -------------------------------------------------------------------------------- Wound Assessment Details Patient Name: Date of Service: [name] [name], TIFFA [place] S. 03/14/2023 9:00 A M Medical Record Number: [id number] Patient Account Number: [id number] Date of Birth/Sex: Treating RN: 08/27/1980 (42 y.o. [name] [name] Primary Care Matisyn Cabeza: Alysia Penna Other Clinician: Referring Morgin Halls: Treating Areon Cocuzza/Extender: Jameriah, [place], [place] Kathie Rhodes ([id number]) [id number] Page 7 of 8 Weeks in Treatment: 6 Wound Status Wound Number:  Vital Signs Height(in): 65 Pulse(bpm): 85 Weight(lbs): 430 Blood Pressure(mmHg): 157/82 Body Mass Index(BMI): 71.5 Temperature(F): 98.2 Respiratory Rate(breaths/min): 20 [1:Photos:] [N/A:N/A] Left, Circumferential Lower Leg N/A N/A Wound Location: Gradually Appeared N/A N/A Wounding Event: Venous Leg Ulcer N/A N/A Primary Etiology: Lymphedema, Sleep Apnea, N/A N/A Comorbid History: Hypertension, Peripheral Venous Disease, Type II Diabetes 01/25/2023 N/A N/A Date Acquired: 6 N/A N/A Weeks of Treatment: Healed - Epithelialized N/A N/A Wound Status: No N/A N/A Wound Recurrence: 0x0x0 N/A N/A Measurements L x W x D (cm) 0 N/A N/A A (cm) : rea 0 N/A N/A Volume (cm) : 100.00% N/A N/A % Reduction in Area: 100.00% N/A N/A % Reduction in Volume: Full Thickness Without Exposed N/A N/A Classification: Support Structures None Present N/A N/A Exudate Amount: Distinct, outline attached N/A N/A Wound Margin: None Present (0%) N/A N/A Granulation Amount: None Present (0%) N/A N/A Necrotic Amount: Fascia: No N/A N/A Exposed Structures: Fat Layer (Subcutaneous Tissue): No Tendon: No Muscle: No Joint: No Bone: No Large (67-100%) N/A N/A Epithelialization: Rubenstein, Mandi S ([id number]) [id number] Page 5 of 8 No  Abnormalities Noted N/A N/A Periwound Skin Texture: Maceration: Yes N/A N/A Periwound Skin Moisture: Hemosiderin Staining: Yes N/A N/A Periwound Skin Color: No Abnormality N/A N/A Temperature: Treatment Notes Electronic Signature(s) Signed: 03/21/2023 9:49:10 AM By: Duanne Guess MD FACS Signed: 05/09/2023 4:46:25 PM By: Zenaida Deed RN, BSN Entered By: Duanne Guess on 03/21/2023 06:49:10 -------------------------------------------------------------------------------- Multi-Disciplinary Care Plan Details Patient Name: Date of Service: [name] [name], TIFFA [place] S. 03/14/2023 9:00 A M Medical Record Number: [id number] Patient Account Number: [id number] Date of Birth/Sex: Treating RN: December 15, 1980 (42 y.o. [name] [name] Primary Care Aneira Cavitt: Alysia Penna Other Clinician: Referring Gladyce Mcray: Treating Layanna Charo/Extender: Sherlynn Stalls in Treatment: 6 Active Inactive Electronic Signature(s) Signed: 03/14/2023 6:34:47 PM By: Zenaida Deed RN, BSN Entered By: Zenaida Deed on 03/14/2023 07:01:29 -------------------------------------------------------------------------------- Pain Assessment Details Patient Name: Date of Service: [name] [name], TIFFA [place] S. 03/14/2023 9:00 A M Medical Record Number: [id number] Patient Account Number: [id number] Date of Birth/Sex: Treating RN: 1981-08-12 (42 y.o. F) Primary Care Aminata [place]: Alysia Penna Other Clinician: Referring Kayden Hutmacher: Treating Dmarion Perfect/Extender: Sherlynn Stalls in Treatment: 6 Active Problems Location of Pain Severity and Description of Pain Patient Has Paino No Site Locations Rate the pain. ORIANE, WIELGUS ([id number]) [id number] Page 6 of 8 Rate the pain. Current Pain Level: 0 Pain Management and Medication Current Pain Management: Electronic Signature(s) Signed: 03/14/2023 6:34:47 PM By: Zenaida Deed RN, BSN Entered By: Zenaida Deed on  03/14/2023 06:54:42 -------------------------------------------------------------------------------- Patient/Caregiver Education Details Patient Name: Date of Service: [name], TIFFA [place] Kathie Rhodes 7/22/2024andnbsp9:00 A M Medical Record Number: [id number] Patient Account Number: [id number] Date of Birth/Gender: Treating RN: Nov 23, 1980 (42 y.o. [name] [name] Primary Care Physician: Alysia Penna Other Clinician: Referring Physician: Treating Physician/Extender: Sherlynn Stalls in Treatment: 6 Education Assessment Education Provided To: Patient Education Topics Provided Venous: Methods: Explain/Verbal Responses: Reinforcements needed, State content correctly Electronic Signature(s) Signed: 03/14/2023 6:34:47 PM By: Zenaida Deed RN, BSN Entered By: Zenaida Deed on 03/14/2023 07:02:11 -------------------------------------------------------------------------------- Wound Assessment Details Patient Name: Date of Service: [name] [name], TIFFA [place] S. 03/14/2023 9:00 A M Medical Record Number: [id number] Patient Account Number: [id number] Date of Birth/Sex: Treating RN: 08/27/1980 (42 y.o. [name] [name] Primary Care Matisyn Cabeza: Alysia Penna Other Clinician: Referring Morgin Halls: Treating Areon Cocuzza/Extender: Jameriah, [place], [place] Kathie Rhodes ([id number]) [id number] Page 7 of 8 Weeks in Treatment: 6 Wound Status Wound Number:  1 Primary Venous Leg Ulcer Etiology: Wound Location: Left, Circumferential Lower Leg Wound Healed - Epithelialized Wounding Event: Gradually Appeared Status: Date Acquired: 01/25/2023 Comorbid Lymphedema, Sleep Apnea, Hypertension, Peripheral Venous Weeks Of Treatment: 6 History: Disease, Type II Diabetes Clustered Wound: No Photos Wound Measurements Length: (cm) Width: (cm) Depth: (cm) Area: (cm) Volume: (cm) 0 % Reduction in Area: 100% 0 % Reduction in Volume: 100% 0 Epithelialization:  Large (67-100%) 0 Tunneling: No 0 Undermining: No Wound Description Classification: Full Thickness Without Exposed Suppor Wound Margin: Distinct, outline attached Exudate Amount: None Present t Structures Foul Odor After Cleansing: No Slough/Fibrino No Wound Bed Granulation Amount: None Present (0%) Exposed Structure Necrotic Amount: None Present (0%) Fascia Exposed: No Fat Layer (Subcutaneous Tissue) Exposed: No Tendon Exposed: No Muscle Exposed: No Joint Exposed: No Bone Exposed: No Periwound Skin Texture Texture Color No Abnormalities Noted: Yes No Abnormalities Noted: No Hemosiderin Staining: Yes Moisture No Abnormalities Noted: Yes Temperature / Pain Temperature: No Abnormality Electronic Signature(s) Signed: 03/14/2023 6:34:47 PM By: Zenaida Deed RN, BSN Entered By: Zenaida Deed on 03/14/2023 07:00:44 -------------------------------------------------------------------------------- Vitals Details Patient Name: Date of Service: [name] [name], TIFFA [place] S. 03/14/2023 9:00 A M Medical Record Number: [id number] Patient Account Number: [id number] Date of Birth/Sex: Treating RN: May 18, 1981 (42 y.o. F) Primary Care Raeanna Soberanes: Alysia Penna Other Clinician: Referring Shronda Boeh: Treating Kaleb Linquist/Extender: Sherlynn Stalls in Treatment: 6 Dowers, [name] ([id number]) [id number] Page 8 of 8 Vital Signs Time Taken: 09:40 Temperature (F): 98.2 Height (in): 65 Pulse (bpm): 85 Weight (lbs): 430 Respiratory Rate (breaths/min): 20 Body Mass Index (BMI): 71.5 Blood Pressure (mmHg): 157/82 Reference Range: 80 - 120 mg / dl Electronic Signature(s) Signed: 03/14/2023 6:34:47 PM By: Zenaida Deed RN, BSN Entered By: Zenaida Deed on 03/14/2023 06:54:30

## 2023-03-28 ENCOUNTER — Ambulatory Visit (HOSPITAL_BASED_OUTPATIENT_CLINIC_OR_DEPARTMENT_OTHER): Payer: Medicare Other | Admitting: General Surgery

## 2023-04-04 ENCOUNTER — Ambulatory Visit (INDEPENDENT_AMBULATORY_CARE_PROVIDER_SITE_OTHER): Payer: Medicare Other

## 2023-04-04 DIAGNOSIS — Z0000 Encounter for general adult medical examination without abnormal findings: Secondary | ICD-10-CM

## 2023-04-04 NOTE — Patient Instructions (Signed)
[name] [name] , Thank you for taking time to come for your Medicare Wellness Visit. I appreciate your ongoing commitment to your health goals. Please review the following plan we discussed and let me know if I can assist you in the future.   Referrals/Orders/Follow-Ups/Clinician Recommendations: none  This is a list of the screening recommended for you and due dates:  Health Maintenance  Topic Date Due   Pap Smear  Never done   Eye exam for diabetics  10/28/2017   COVID-19 Vaccine (3 - 2023-24 season) 04/23/2022   Flu Shot  03/24/2023   Hepatitis C Screening  02/10/2024*   Hemoglobin A1C  07/09/2023   Yearly kidney function blood test for diabetes  01/06/2024   Yearly kidney health urinalysis for diabetes  02/10/2024   Complete foot exam   02/10/2024   Medicare Annual Wellness Visit  04/03/2024   HIV Screening  Completed   HPV Vaccine  Aged Out   DTaP/Tdap/Td vaccine  Discontinued  *Topic was postponed. The date shown is not the original due date.    Advanced directives: (ACP Link)Information on Advanced Care Planning can be found at [facility] of [facility] Advance Health Care Directives Advance Health Care Directives ([email or web address]   Next Medicare Annual Wellness Visit scheduled for next year: Yes  Preventive Care 40-64 Years, Female Preventive care refers to lifestyle choices and visits with your health care provider that can promote health and wellness. What does preventive care include? A yearly physical exam. This is also called an annual well check. Dental exams once or twice a year. Routine eye exams. Ask your health care provider how often you should have your eyes checked. Personal lifestyle choices, including: Daily care of your teeth and gums. Regular physical activity. Eating a healthy diet. Avoiding tobacco and drug use. Limiting alcohol use. Practicing safe sex. Taking low-dose aspirin daily starting at age 83. Taking vitamin and mineral supplements as  recommended by your health care provider. What happens during an annual well check? The services and screenings done by your health care provider during your annual well check will depend on your age, overall health, lifestyle risk factors, and family history of disease. Counseling  Your health care provider may ask you questions about your: Alcohol use. Tobacco use. Drug use. Emotional well-being. Home and relationship well-being. Sexual activity. Eating habits. Work and work Astronomer. Method of birth control. Menstrual cycle. Pregnancy history. Screening  You may have the following tests or measurements: Height, weight, and BMI. Blood pressure. Lipid and cholesterol levels. These may be checked every 5 years, or more frequently if you are over 36 years old. Skin check. Lung cancer screening. You may have this screening every year starting at age 64 if you have a 30-pack-year history of smoking and currently smoke or have quit within the past 15 years. Fecal occult blood test (FOBT) of the stool. You may have this test every year starting at age 28. Flexible sigmoidoscopy or colonoscopy. You may have a sigmoidoscopy every 5 years or a colonoscopy every 10 years starting at age 51. Hepatitis C blood test. Hepatitis B blood test. Sexually transmitted disease (STD) testing. Diabetes screening. This is done by checking your blood sugar (glucose) after you have not eaten for a while (fasting). You may have this done every 1-3 years. Mammogram. This may be done every 1-2 years. Talk to your health care provider about when you should start having regular mammograms. This may depend on whether you have a family history  of breast cancer. BRCA-related cancer screening. This may be done if you have a family history of breast, ovarian, tubal, or peritoneal cancers. Pelvic exam and Pap test. This may be done every 3 years starting at age 59. Starting at age 70, this may be done every 5 years if  you have a Pap test in combination with an HPV test. Bone density scan. This is done to screen for osteoporosis. You may have this scan if you are at high risk for osteoporosis. Discuss your test results, treatment options, and if necessary, the need for more tests with your health care provider. Vaccines  Your health care provider may recommend certain vaccines, such as: Influenza vaccine. This is recommended every year. Tetanus, diphtheria, and acellular pertussis (Tdap, Td) vaccine. You may need a Td booster every 10 years. Zoster vaccine. You may need this after age 62. Pneumococcal 13-valent conjugate (PCV13) vaccine. You may need this if you have certain conditions and were not previously vaccinated. Pneumococcal polysaccharide (PPSV23) vaccine. You may need one or two doses if you smoke cigarettes or if you have certain conditions. Talk to your health care provider about which screenings and vaccines you need and how often you need them. This information is not intended to replace advice given to you by your health care provider. Make sure you discuss any questions you have with your health care provider. Document Released: 09/05/2015 Document Revised: 04/28/2016 Document Reviewed: 06/10/2015 Elsevier Interactive Patient Education  2017 [employer].    Fall Prevention in the Home Falls can cause injuries. They can happen to people of all ages. There are many things you can do to make your home safe and to help prevent falls. What can I do on the outside of my home? Regularly fix the edges of walkways and driveways and fix any cracks. Remove anything that might make you trip as you walk through a door, such as a raised step or threshold. Trim any bushes or trees on the path to your home. Use bright outdoor lighting. Clear any walking paths of anything that might make someone trip, such as rocks or tools. Regularly check to see if handrails are loose or broken. Make sure that both  sides of any steps have handrails. Any raised decks and porches should have guardrails on the edges. Have any leaves, snow, or ice cleared regularly. Use sand or salt on walking paths during winter. Clean up any spills in your garage right away. This includes oil or grease spills. What can I do in the bathroom? Use night lights. Install grab bars by the toilet and in the tub and shower. Do not use towel bars as grab bars. Use non-skid mats or decals in the tub or shower. If you need to sit down in the shower, use a plastic, non-slip stool. Keep the floor dry. Clean up any water that spills on the floor as soon as it happens. Remove soap buildup in the tub or shower regularly. Attach bath mats securely with double-sided non-slip rug tape. Do not have throw rugs and other things on the floor that can make you trip. What can I do in the bedroom? Use night lights. Make sure that you have a light by your bed that is easy to reach. Do not use any sheets or blankets that are too big for your bed. They should not hang down onto the floor. Have a firm chair that has side arms. You can use this for support while you get dressed.  Do not have throw rugs and other things on the floor that can make you trip. What can I do in the kitchen? Clean up any spills right away. Avoid walking on wet floors. Keep items that you use a lot in easy-to-reach places. If you need to reach something above you, use a strong step stool that has a grab bar. Keep electrical cords out of the way. Do not use floor polish or wax that makes floors slippery. If you must use wax, use non-skid floor wax. Do not have throw rugs and other things on the floor that can make you trip. What can I do with my stairs? Do not leave any items on the stairs. Make sure that there are handrails on both sides of the stairs and use them. Fix handrails that are broken or loose. Make sure that handrails are as long as the stairways. Check any  carpeting to make sure that it is firmly attached to the stairs. Fix any carpet that is loose or worn. Avoid having throw rugs at the top or bottom of the stairs. If you do have throw rugs, attach them to the floor with carpet tape. Make sure that you have a light switch at the top of the stairs and the bottom of the stairs. If you do not have them, ask someone to add them for you. What else can I do to help prevent falls? Wear shoes that: Do not have high heels. Have rubber bottoms. Are comfortable and fit you well. Are closed at the toe. Do not wear sandals. If you use a stepladder: Make sure that it is fully opened. Do not climb a closed stepladder. Make sure that both sides of the stepladder are locked into place. Ask someone to hold it for you, if possible. Clearly mark and make sure that you can see: Any grab bars or handrails. First and last steps. Where the edge of each step is. Use tools that help you move around (mobility aids) if they are needed. These include: Canes. Walkers. Scooters. Crutches. Turn on the lights when you go into a dark area. Replace any light bulbs as soon as they burn out. Set up your furniture so you have a clear path. Avoid moving your furniture around. If any of your floors are uneven, fix them. If there are any pets around you, be aware of where they are. Review your medicines with your doctor. Some medicines can make you feel dizzy. This can increase your chance of falling. Ask your doctor what other things that you can do to help prevent falls. This information is not intended to replace advice given to you by your health care provider. Make sure you discuss any questions you have with your health care provider. Document Released: 06/05/2009 Document Revised: 01/15/2016 Document Reviewed: 09/13/2014 Elsevier Interactive Patient Education  2017 [employer].

## 2023-04-04 NOTE — Progress Notes (Signed)
Subjective:   [name] [name] is a 42 y.o. female who presents for Medicare Annual (Subsequent) preventive examination.  Visit Complete: Virtual  I connected with  Juan Quam on 04/04/23 by a audio enabled telemedicine application and verified that I am speaking with the correct person using two identifiers.  Patient Location: Home  Provider Location: Office/Clinic  I discussed the limitations of evaluation and management by telemedicine. The patient expressed understanding and agreed to proceed.  Vital Signs: Unable to obtain new vitals due to this being a telehealth visit.  Review of Systems     Cardiac Risk Factors include: advanced age (>24men, >33 women);diabetes mellitus;dyslipidemia;hypertension     Objective:    Today's Vitals   There is no height or weight on file to calculate BMI.     04/04/2023   11:32 AM 03/13/2020   12:48 PM 02/18/2020    7:36 AM 11/16/2016    8:14 PM 09/24/2016    1:38 PM 09/15/2016   11:00 PM 09/15/2016   11:51 AM  Advanced Directives  Does Patient Have a Medical Advance Directive? No No No No No No No  Would patient like information on creating a medical advance directive?  Yes (MAU/Ambulatory/Procedural Areas - Information given) No - Patient declined No - Patient declined No - Patient declined No - Patient declined     Current Medications (verified) Outpatient Encounter Medications as of 04/04/2023  Medication Sig   amLODipine (NORVASC) 5 MG tablet TAKE 1 TABLET (5 MG TOTAL) BY MOUTH DAILY.   atorvastatin (LIPITOR) 20 MG tablet TAKE 1 TABLET BY MOUTH EVERY DAY   carvedilol (COREG) 25 MG tablet Take 1 tablet (25 mg total) by mouth 2 (two) times daily.   citalopram (CELEXA) 10 MG tablet TAKE 1 TABLET BY MOUTH EVERY DAY   furosemide (LASIX) 40 MG tablet Take 1 tablet (40 mg total) by mouth daily.   hydrALAZINE (APRESOLINE) 100 MG tablet Take 1 tablet (100 mg total) by mouth 3 (three) times daily.   isosorbide dinitrate  (ISORDIL) 30 MG tablet TAKE 1 TABLET BY MOUTH 3 (THREE) TIMES DAILY.   lisinopril (ZESTRIL) 2.5 MG tablet Take 1 tablet (2.5 mg total) by mouth daily.   Semaglutide (RYBELSUS) 3 MG TABS Take 1 tablet (3 mg total) by mouth daily.   No facility-administered encounter medications on file as of 04/04/2023.    Allergies (verified) No known allergies   History: Past Medical History:  Diagnosis Date   Acute on chronic respiratory failure with hypoxia (HCC) 02/18/2020   Chronic diastolic heart failure (HCC)    Echo 10/12: EF 50-55 // b. Echo 10/17: EF 50-55, mild BAE, PASP 42 // Echo 10/18: Mild concentric LVH, EF 55-60, normal wall motion, mild MAC, severe LAE, mild TR, PASP 15, trivial pericardial effusion   Chronic kidney disease, stage 4 (severe) (HCC) 10/25/2016   CKD (chronic kidney disease) stage 4, GFR 15-29 ml/min (HCC) 10/25/2016   admitted in 2/18 with SCr of 10 in the setting of dehydration from illness, etc >> improved to 5 at DC   CKD (chronic kidney disease) stage 4, GFR 15-29 ml/min (HCC) 10/25/2016   DM2 (diabetes mellitus, type 2) (HCC)    A1c 7.2 in 05/2016   Gestational diabetes mellitus in pregnancy 05/24/2013   HCAP (healthcare-associated pneumonia)    HLD (hyperlipidemia)    HTN (hypertension)    HTN in pregnancy, chronic 05/24/2013   Hypertensive heart disease with CHF (congestive heart failure) (HCC) 05/25/2016   Lower extremity  edema    Morbid obesity (HCC)    OSA (obstructive sleep apnea) 07/09/2011   s/p Trach 2/2 prolonged respiratory failure during admx for CHF in 05/2016   Pneumonitis 09/15/2016   Prediabetes    Pulmonary hypertension (HCC)    secondary from CHF/OSA   Purulent bronchitis (HCC)    Sinus pause 06/07/2016   Sinus tachycardia    Sinus tachycardia 05/26/2016   SOB (shortness of breath)    Urinary retention    Past Surgical History:  Procedure Laterality Date   TRACHEOSTOMY TUBE PLACEMENT N/A 06/11/2016   Procedure: TRACHEOSTOMY;   Surgeon: Christia Reading, MD;  [facility];  Service: ENT;  Laterality: N/A;   Family History  Problem Relation Age of Onset   Hypertension Mother    Hyperlipidemia Mother    Diabetes Mother    Obesity Mother    Diabetes Other    Birth defects Paternal Grandfather        unknown type   Social History   Socioeconomic History   Marital status: Single    Spouse name: Not on file   Number of children: Not on file   Years of education: Not on file   Highest education level: Not on file  Occupational History   Occupation: disability    Occupation: Stay at home Mom  Tobacco Use   Smoking status: Never   Smokeless tobacco: Never  Vaping Use   Vaping status: Never Used  Substance and Sexual Activity   Alcohol use: No   Drug use: No   Sexual activity: Not Currently    Birth control/protection: I.U.D.  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[employer]   DENIES ANY TOBACCO OR ETOH USE         Social Determinants of Health   Financial Resource Strain: Low Risk  (04/04/2023)   Overall Financial Resource Strain (CARDIA)    Difficulty of Paying Living Expenses: Not hard at all  Food Insecurity: No Food Insecurity (04/04/2023)   Hunger Vital Sign    Worried About Running Out of Food in the Last Year: Never true    Ran Out of Food in the Last Year: Never true  Transportation Needs: No Transportation Needs (04/04/2023)   PRAPARE - Administrator, Civil Service (Medical): No    Lack of Transportation (Non-Medical): No  Physical Activity: Insufficiently Active (04/04/2023)   Exercise Vital Sign    Days of Exercise per Week: 2 days    Minutes of Exercise per Session: 20 min  Stress: No Stress Concern Present (04/04/2023)   [employer] of Occupational Health - Occupational Stress Questionnaire    Feeling of Stress : Not at all  Social Connections: Moderately Isolated (04/04/2023)   Social Connection  and Isolation Panel [NHANES]    Frequency of Communication with Friends and Family: More than three times a week    Frequency of Social Gatherings with Friends and Family: More than three times a week    Attends Religious Services: More than 4 times per year    Active Member of [employer] or Organizations: No    Attends Banker Meetings: Never    Marital Status: Never married    Tobacco Counseling Counseling given: Not Answered   Clinical Intake:  Pre-visit preparation completed: Yes  Pain : No/denies pain     Nutritional Risks: None Diabetes: Yes CBG done?: No Did pt. bring  in CBG monitor from home?: No  How often do you need to have someone help you when you read instructions, pamphlets, or other written materials from your doctor or pharmacy?: 1 - Never  Interpreter Needed?: No  Information entered by :: NAllen LPN   Activities of Daily Living    04/04/2023   11:27 AM  In your present state of health, do you have any difficulty performing the following activities:  Hearing? 0  Vision? 0  Difficulty concentrating or making decisions? 0  Walking or climbing stairs? 1  Dressing or bathing? 0  Doing errands, shopping? 0  Preparing Food and eating ? N  Using the Toilet? N  In the past six months, have you accidently leaked urine? N  Do you have problems with loss of bowel control? N  Managing your Medications? N  Managing your Finances? N  Housekeeping or managing your Housekeeping? N    Patient Care Team: Nche, Bonna Gains, NP as PCP - General (Internal Medicine) Jake Bathe, MD as PCP - Cardiology (Cardiology)  Indicate any recent Medical Services you may have received from other than [facility] providers in the past year (date may be approximate).     Assessment:   This is a routine wellness examination for [name] [name].  Hearing/Vision screen Hearing Screening - Comments:: Denies hearing issues Vision Screening - Comments:: No regular eye  exams  Dietary issues and exercise activities discussed:     Goals Addressed             This Visit's Progress    Patient Stated       04/04/2023, wants to lose weight       Depression Screen    04/04/2023   11:33 AM 01/06/2023   10:42 AM 03/31/2022   10:09 AM 03/31/2022   10:08 AM 10/13/2021   10:18 AM 11/26/2020   11:38 AM 07/01/2020   11:52 AM  PHQ 2/9 Scores  PHQ - 2 Score 0 1 0 0 3 0 4  PHQ- 9 Score 0 2   11  10     Fall Risk    04/04/2023   11:32 AM 03/31/2022   10:09 AM 11/26/2020   10:25 AM 03/13/2020   12:49 PM 03/13/2020   11:05 AM  Fall Risk   Falls in the past year? 0 0 0 0 0  Number falls in past yr: 0 0 0 0 0  Injury with Fall? 0 0 0 0 0  Risk for fall due to : Medication side effect  No Fall Risks    Follow up Falls prevention discussed;Falls evaluation completed Education provided Falls evaluation completed Falls prevention discussed     MEDICARE RISK AT HOME:  Medicare Risk at Home - 04/04/23 1132     Any stairs in or around the home? No    If so, are there any without handrails? No    Home free of loose throw rugs in walkways, pet beds, electrical cords, etc? Yes    Adequate lighting in your home to reduce risk of falls? Yes    Life alert? No    Use of a cane, walker or w/c? No    Grab bars in the bathroom? No    Shower chair or bench in shower? No    Elevated toilet seat or a handicapped toilet? Yes             TIMED UP AND GO:  Was the test performed?  No    Cognitive Function:  04/04/2023   11:33 AM 03/31/2022   10:15 AM  6CIT Screen  What Year? 0 points 0 points  What month? 0 points 0 points  What time? 0 points 0 points  Count back from 20 0 points 0 points  Months in reverse 0 points 0 points  Repeat phrase 0 points 0 points  Total Score 0 points 0 points    Immunizations Immunization History  Administered Date(s) Administered   Influenza,inj,Quad PF,6+ Mos 06/30/2016, 05/28/2020   PFIZER(Purple Top)SARS-COV-2  Vaccination 11/28/2019, 12/19/2019   Pneumococcal Polysaccharide-23 11/26/2020    TDAP status: Up to date  Flu Vaccine status: Due, Education has been provided regarding the importance of this vaccine. Advised may receive this vaccine at local pharmacy or Health Dept. Aware to provide a copy of the vaccination record if obtained from local pharmacy or Health Dept. Verbalized acceptance and understanding.  Pneumococcal vaccine status: Up to date  Covid-19 vaccine status: Information provided on how to obtain vaccines.   Qualifies for Shingles Vaccine? No   Zostavax completed  n/a   Shingrix Completed?: n/a  Screening Tests Health Maintenance  Topic Date Due   PAP SMEAR-Modifier  Never done   OPHTHALMOLOGY EXAM  10/28/2017   COVID-19 Vaccine (3 - 2023-24 season) 04/23/2022   INFLUENZA VACCINE  03/24/2023   Hepatitis C Screening  02/10/2024 (Originally 07/21/1997)   HEMOGLOBIN A1C  07/09/2023   Diabetic kidney evaluation - eGFR measurement  01/06/2024   Diabetic kidney evaluation - Urine ACR  02/10/2024   FOOT EXAM  02/10/2024   Medicare Annual Wellness (AWV)  04/03/2024   HIV Screening  Completed   HPV VACCINES  Aged Out   DTaP/Tdap/Td  Discontinued    Health Maintenance  Health Maintenance Due  Topic Date Due   PAP SMEAR-Modifier  Never done   OPHTHALMOLOGY EXAM  10/28/2017   COVID-19 Vaccine (3 - 2023-24 season) 04/23/2022   INFLUENZA VACCINE  03/24/2023    Colorectal cancer screening: n/a   Mammogram status: Completed 03/27/2020. Repeat every year  Bone Density status: n/a  Lung Cancer Screening: (Low Dose CT Chest recommended if Age 72-80 years, 20 pack-year currently smoking OR have quit w/in 15years.) does not qualify.   Lung Cancer Screening Referral: no  Additional Screening:  Hepatitis C Screening: does not qualify;   Vision Screening: Recommended annual ophthalmology exams for early detection of glaucoma and other disorders of the eye. Is the patient  up to date with their annual eye exam?  No  Who is the provider or what is the name of the office in which the patient attends annual eye exams? none If pt is not established with a provider, would they like to be referred to a provider to establish care? No .   Dental Screening: Recommended annual dental exams for proper oral hygiene  Diabetic Foot Exam: Diabetic Foot Exam: Completed 02/10/2023  Community Resource Referral / Chronic Care Management: CRR required this visit?  No   CCM required this visit?  No     Plan:     I have personally reviewed and noted the following in the patient's chart:   Medical and social history Use of alcohol, tobacco or illicit drugs  Current medications and supplements including opioid prescriptions. Patient is not currently taking opioid prescriptions. Functional ability and status Nutritional status Physical activity Advanced directives List of other physicians Hospitalizations, surgeries, and ER visits in previous 12 months Vitals Screenings to include cognitive, depression, and falls Referrals and appointments  In addition,  I have reviewed and discussed with patient certain preventive protocols, quality metrics, and best practice recommendations. A written personalized care plan for preventive services as well as general preventive health recommendations were provided to patient.     Barb Merino, LPN   6/44/0347   After Visit Summary: (MyChart) Due to this being a telephonic visit, the after visit summary with patients personalized plan was offered to patient via MyChart   Nurse Notes: none

## 2023-04-07 ENCOUNTER — Encounter (INDEPENDENT_AMBULATORY_CARE_PROVIDER_SITE_OTHER): Payer: Self-pay

## 2023-04-11 ENCOUNTER — Ambulatory Visit (HOSPITAL_BASED_OUTPATIENT_CLINIC_OR_DEPARTMENT_OTHER): Payer: Medicare Other | Admitting: General Surgery

## 2023-04-18 ENCOUNTER — Encounter (HOSPITAL_BASED_OUTPATIENT_CLINIC_OR_DEPARTMENT_OTHER): Payer: Medicare Other | Attending: General Surgery | Admitting: General Surgery

## 2023-04-18 DIAGNOSIS — I872 Venous insufficiency (chronic) (peripheral): Secondary | ICD-10-CM | POA: Insufficient documentation

## 2023-04-18 DIAGNOSIS — Z930 Tracheostomy status: Secondary | ICD-10-CM | POA: Insufficient documentation

## 2023-04-18 DIAGNOSIS — Z9981 Dependence on supplemental oxygen: Secondary | ICD-10-CM | POA: Diagnosis not present

## 2023-04-18 DIAGNOSIS — E785 Hyperlipidemia, unspecified: Secondary | ICD-10-CM | POA: Diagnosis present

## 2023-04-18 DIAGNOSIS — I110 Hypertensive heart disease with heart failure: Secondary | ICD-10-CM | POA: Diagnosis not present

## 2023-04-18 DIAGNOSIS — I5032 Chronic diastolic (congestive) heart failure: Secondary | ICD-10-CM | POA: Insufficient documentation

## 2023-04-18 DIAGNOSIS — I890 Lymphedema, not elsewhere classified: Secondary | ICD-10-CM | POA: Diagnosis not present

## 2023-04-18 DIAGNOSIS — E11622 Type 2 diabetes mellitus with other skin ulcer: Secondary | ICD-10-CM | POA: Diagnosis not present

## 2023-04-18 DIAGNOSIS — L97822 Non-pressure chronic ulcer of other part of left lower leg with fat layer exposed: Secondary | ICD-10-CM | POA: Insufficient documentation

## 2023-04-18 DIAGNOSIS — I272 Pulmonary hypertension, unspecified: Secondary | ICD-10-CM | POA: Diagnosis not present

## 2023-04-18 DIAGNOSIS — Z833 Family history of diabetes mellitus: Secondary | ICD-10-CM | POA: Diagnosis not present

## 2023-04-18 NOTE — Progress Notes (Signed)
QADRIYYAH, BERGSMA ([id number]) [id number] Page 1 of 5 Visit Report for 04/18/2023 Arrival Information Details Patient Name: Date of Service: [name] [name], [name] [place] [name]. 04/18/2023 11:00 A M Medical Record Number: [id number] Patient Account Number: [id number] Date of Birth/Sex: Treating RN: [birth year] (42 y.o. F) Primary Care [name] [name]: [name] [name] Other Clinician: Referring [name] [name]: Treating [name] [name]/Extender: [name] [name] in Treatment: 11 Visit Information History Since Last Visit All ordered tests and consults were completed: No Patient Arrived: Ambulatory Added or deleted any medications: No Arrival Time: 10:55 Any new allergies or adverse reactions: No Accompanied By: self Had a fall or experienced change in No Transfer Assistance: None activities of daily living that may affect Patient Identification Verified: Yes risk of falls: Secondary Verification Process Completed: Yes Signs or symptoms of abuse/neglect since last visito No Patient Requires Transmission-Based Precautions: No Hospitalized since last visit: No Patient Has Alerts: No Implantable device outside of the clinic excluding No cellular tissue based products placed in the center since last visit: Pain Present Now: No Electronic Signature([name]) Signed: 04/18/2023 1:56:44 PM By: [name] [name] Entered By: [name] [name] on 04/18/2023 07:56:09 -------------------------------------------------------------------------------- Clinic Level of Care Assessment Details Patient Name: Date of Service: [name] [name], [name] [place] [name]. 04/18/2023 11:00 A M Medical Record Number: [id number] Patient Account Number: [id number] Date of Birth/Sex: Treating RN: [birth year] (42 y.o. Katrinka Blazing Primary Care [name] [name]: [name] [name] Other Clinician: Referring [name] [name]: Treating [name] [name]/Extender: [name] [name] in Treatment: 11 Clinic Level of Care Assessment Items TOOL 4  Quantity Score X- 1 0 Use when only an EandM is performed on FOLLOW-UP visit ASSESSMENTS - Nursing Assessment / Reassessment X- 1 10 Reassessment of Co-morbidities (includes updates in patient status) X- 1 5 Reassessment of Adherence to Treatment Plan ASSESSMENTS - Wound and Skin A ssessment / Reassessment []  - 0 Simple Wound Assessment / Reassessment - one wound []  - 0 Complex Wound Assessment / Reassessment - multiple wounds []  - 0 Dermatologic / Skin Assessment (not related to wound area) ASSESSMENTS - Focused Assessment []  - 0 Circumferential Edema Measurements - multi extremities []  - 0 Nutritional Assessment / Counseling / Intervention [name], [name] ([id number]) [id number] Page 2 of 5 []  - 0 Lower Extremity Assessment (monofilament, tuning fork, pulses) []  - 0 Peripheral Arterial Disease Assessment (using hand held doppler) ASSESSMENTS - Ostomy and/or Continence Assessment and Care []  - 0 Incontinence Assessment and Management []  - 0 Ostomy Care Assessment and Management (repouching, etc.) PROCESS - Coordination of Care X - Simple Patient / Family Education for ongoing care 1 15 []  - 0 Complex (extensive) Patient / Family Education for ongoing care X- 1 10 Staff obtains Chiropractor, Records, T Results / Process Orders est X- 1 10 Staff telephones HHA, Nursing Homes / Clarify orders / etc []  - 0 Routine Transfer to another Facility (non-emergent condition) []  - 0 Routine Hospital Admission (non-emergent condition) []  - 0 New Admissions / Manufacturing engineer / Ordering NPWT Apligraf, etc. , []  - 0 Emergency Hospital Admission (emergent condition) X- 1 10 Simple Discharge Coordination []  - 0 Complex (extensive) Discharge Coordination PROCESS - Special Needs []  - 0 Pediatric / Minor Patient Management []  - 0 Isolation Patient Management []  - 0 Hearing / Language / Visual special needs []  - 0 Assessment of Community  assistance (transportation, D/C planning, etc.) []  - 0 Additional assistance / Altered mentation []  - 0 Support Surface([name]) Assessment (bed, cushion, seat, etc.) INTERVENTIONS - Wound Cleansing / Measurement []  - 0  Simple Wound Cleansing - one wound []  - 0 Complex Wound Cleansing - multiple wounds []  - 0 Wound Imaging (photographs - any number of wounds) []  - 0 Wound Tracing (instead of photographs) []  - 0 Simple Wound Measurement - one wound []  - 0 Complex Wound Measurement - multiple wounds INTERVENTIONS - Wound Dressings []  - 0 Small Wound Dressing one or multiple wounds []  - 0 Medium Wound Dressing one or multiple wounds []  - 0 Large Wound Dressing one or multiple wounds []  - 0 Application of Medications - topical []  - 0 Application of Medications - injection INTERVENTIONS - Miscellaneous []  - 0 External ear exam []  - 0 Specimen Collection (cultures, biopsies, blood, body fluids, etc.) []  - 0 Specimen([name]) / Culture([name]) sent or taken to Lab for analysis []  - 0 Patient Transfer (multiple staff / Nurse, adult / Similar devices) []  - 0 Simple Staple / Suture removal (25 or less) []  - 0 Complex Staple / Suture removal (26 or more) []  - 0 Hypo / Hyperglycemic Management (close monitor of Blood Glucose) [name], [name] [name] ([id number]) [id number] Page 3 of 5 []  - 0 Ankle / Brachial Index (ABI) - do not check if billed separately X- 1 5 Vital Signs Has the patient been seen at the hospital within the last three years: Yes Total Score: 65 Level Of Care: New/Established - Level 2 Electronic Signature([name]) Signed: 04/18/2023 3:46:49 PM By: Karie Schwalbe RN Entered By: Karie Schwalbe on 04/18/2023 08:24:16 -------------------------------------------------------------------------------- Encounter Discharge Information Details Patient Name: Date of Service: [name] [name], [name] [name]. 04/18/2023 11:00 A M Medical Record Number: [id number] Patient Account  Number: [id number] Date of Birth/Sex: Treating RN: 1980/11/20 (42 y.o. Katrinka Blazing Primary Care [name] [name]: [name] [name] Other Clinician: Referring [name] [name]: Treating [name] [name]/Extender: [name] [name] in Treatment: 11 Encounter Discharge Information Items Discharge Condition: Stable Ambulatory Status: Ambulatory Discharge Destination: Home Transportation: Private Auto Accompanied By: self Schedule Follow-up Appointment: Yes Clinical Summary of Care: Patient Declined Electronic Signature([name]) Signed: 04/18/2023 3:46:49 PM By: Karie Schwalbe RN Entered By: Karie Schwalbe on 04/18/2023 12:43:15 -------------------------------------------------------------------------------- Lower Extremity Assessment Details Patient Name: Date of Service: [name] [name], [name] [name]. 04/18/2023 11:00 A M Medical Record Number: [id number] Patient Account Number: [id number] Date of Birth/Sex: Treating RN: 07-05-1981 (42 y.o. Katrinka Blazing Primary Care Eletha Culbertson: [name] [name] Other Clinician: Referring Beola Vasallo: Treating Graceann Boileau/Extender: [name] [name] in Treatment: 11 Edema Assessment Assessed: [Left: No] [Right: No] Edema: [Left: Ye] [Right: [name]] Calf Left: Right: Point of Measurement: 32 cm From Medial Instep 63 cm Ankle Left: Right: Point of Measurement: 11 cm From Medial Instep 28 cm Knee To Floor Left: Right: KRISTYNE, KINDSCHI ([id number]) [id number] Page 4 of 5 From Medial Instep 39 cm Vascular Assessment Pulses: Dorsalis Pedis Palpable: [Left:Yes] Extremity colors, hair growth, and conditions: Extremity Color: [Left:Hyperpigmented] Hair Growth on Extremity: [Left:Yes] Temperature of Extremity: [Left:Warm] Capillary Refill: [Left:< 3 seconds] Dependent Rubor: [Left:No] Blanched when Elevated: [Left:No No] Toe Nail Assessment Left: Right: Thick: Yes Discolored: No Deformed: No Improper Length and  Hygiene: No Electronic Signature([name]) Signed: 04/18/2023 3:46:49 PM By: Karie Schwalbe RN Entered By: Karie Schwalbe on 04/18/2023 08:05:46 -------------------------------------------------------------------------------- Multi Wound Chart Details Patient Name: Date of Service: [name] [name], [name] [name]. 04/18/2023 11:00 A M Medical Record Number: [id number] Patient Account Number: [id number] Date of Birth/Sex: Treating RN: [birth year] (42 y.o. F) Primary Care Aranza Geddes: [name] [name] Other Clinician: Referring Zoiee Wimmer: Treating Shontay Wallner/Extender: [name] [name] in Treatment: [phone]  Vital Signs Height(in): 65 Pulse(bpm): 86 Weight(lbs): 430 Blood Pressure(mmHg): 150/85 Body Mass Index(BMI): 71.5 Temperature(F): 98.5 Respiratory Rate(breaths/min): 20 [Treatment Notes:Wound Assessments Treatment Notes] Electronic Signature([name]) Signed: 04/18/2023 11:26:14 AM By: Duanne Guess MD FACS Entered By: Duanne Guess on 04/18/2023 08:26:14 -------------------------------------------------------------------------------- Pain Assessment Details Patient Name: Date of Service: [name] [name], [name] [name]. 04/18/2023 11:00 A M Medical Record Number: [id number] Patient Account Number: [id number] Date of Birth/Sex: Treating RN: 18-Feb-1981 (42 y.o. Katrinka Blazing Primary Care Cassie Henkels: [name] [name] Other Clinician: GINESSA, BAUDIN ([id number]) [id number] Page 5 of 5 Referring Fredick Schlosser: Treating Atlas Crossland/Extender: [name] [name] in Treatment: 11 Active Problems Location of Pain Severity and Description of Pain Patient Has Paino No Site Locations Pain Management and Medication Current Pain Management: Electronic Signature([name]) Signed: 04/18/2023 3:46:49 PM By: Karie Schwalbe RN Entered By: Karie Schwalbe on 04/18/2023 08:05:42 -------------------------------------------------------------------------------- Vitals  Details Patient Name: Date of Service: [name] [name], [name] [name]. 04/18/2023 11:00 A M Medical Record Number: [id number] Patient Account Number: [id number] Date of Birth/Sex: Treating RN: 1980/10/17 (42 y.o. F) Primary Care Gonsalo Cuthbertson: [name] [name] Other Clinician: Referring Selah Klang: Treating Kynzli Rease/Extender: [name] [name] in Treatment: 11 Vital Signs Time Taken: 10:56 Temperature (F): 98.5 Height (in): 65 Pulse (bpm): 86 Weight (lbs): 430 Respiratory Rate (breaths/min): 20 Body Mass Index (BMI): 71.5 Blood Pressure (mmHg): 150/85 Reference Range: 80 - 120 mg / dl Electronic Signature([name]) Signed: 04/18/2023 1:56:44 PM By: [name] [name] Entered By: [name] [name] on 04/18/2023 07:56:29

## 2023-04-18 NOTE — Progress Notes (Signed)
ZAYDIE, IRONS ([id number]) [id number] Page 1 of 7 Visit Report for 04/18/2023 Chief Complaint Document Details Patient Name: Date of Service: [name] [name], [name] [place] S. 04/18/2023 11:00 A M Medical Record Number: [id number] Patient Account Number: [id number] Date of Birth/Sex: Treating RN: 1980-12-15 (43 y.o. F) Primary Care Provider: Alysia Penna Other Clinician: Referring Provider: Treating Provider/Extender: Sherlynn Stalls in Treatment: 11 Information Obtained from: Patient Chief Complaint Patient presents for treatment of an open ulcer due to venous insufficiency in the setting of severe stage 3 lymphedema Electronic Signature(s) Signed: 04/18/2023 11:26:20 AM By: Duanne Guess MD FACS Entered By: Duanne Guess on 04/18/2023 08:26:19 -------------------------------------------------------------------------------- HPI Details Patient Name: Date of Service: [name], [name] 04/18/2023 11:00 A M Medical Record Number: [id number] Patient Account Number: [id number] Date of Birth/Sex: Treating RN: 1981-04-02 (42 y.o. F) Primary Care Provider: Alysia Penna Other Clinician: Referring Provider: Treating Provider/Extender: Sherlynn Stalls in Treatment: 11 History of Present Illness HPI Description: ADMISSION 01/25/2023 This is a 42 year old type II diabetic (last hemoglobin A1c 6.9%) who is super morbidly obese and relies on a tracheostomy as well as oxygen via nasal cannula during the day. She has congestive heart failure and the skin changes in her legs are consistent with lymphedema. She was referred to [place] for venous stasis dermatitis however she has circumferential breakdown of the skin on her left leg. There is thick slough formation. ABIs were noncompressible, secondary to the lymphedema, but she does have a palpable dorsalis pedis pulse. 02/01/2023: The wound is covered with a thick layer of  yellowish-gray slimy slough and has a strong putrid odor. 02/10/2023: The wound is less malodorous today and has less slime on the surface, but there is still slough present and a persistent odor. The culture that I took last week was polymicrobial with predominant species including Morganella and a plethora of skin flora. Both Augmentin and Bactrim were recommended as treatment in combination and these were prescribed. Unfortunately, she is unable to afford Keystone topical antibiotic compound. She is tolerating her oral therapy well, however. 02/16/2023: The odor has completely abated and the wound surfaces are clean and dry. There is just a very small remaining open area on her posteromedial leg. She does not have compression garments, nor does she have lymphedema pumps. 02/28/2023: The remaining open area on her posteromedial leg is down to around a millimeter. Still no word regarding her lymphedema pumps or compression garments. 03/14/23: Healed, has farrow wraps with her today. Waiting 30 days for pumps. 04/18/2023: This is a 30-day visit in order to establish ongoing need for lymphedema pumps. She has been wearing her Farrow wraps and there has been no recurrence of her wounds. She continues to experience skin changes and bilateral lower extremity edema consistent with her severe stage III lymphedema. She is unable to exercise secondary to other medical comorbidities including tracheostomy dependence and chronic nasal cannula oxygen use. She will benefit from long-term use of lymphedema pumps to prevent future rewounding events. Electronic Signature(s) Signed: 04/18/2023 11:27:46 AM By: Duanne Guess MD FACS Routzahn, [name] ([id number]) [id number] Page 2 of 7 Signed: 04/18/2023 11:27:46 AM By: Duanne Guess MD FACS Entered By: Duanne Guess on 04/18/2023 08:27:45 -------------------------------------------------------------------------------- Physical Exam  Details Patient Name: Date of Service: [name] [name], [name] 04/18/2023 11:00 A M Medical Record Number: [id number] Patient Account Number: [id number] Date of Birth/Sex: Treating RN: [birth year] (42 y.o. F) Primary Care Provider: Alysia Penna Other Clinician: Referring Provider:  Treating Provider/Extender: Sherlynn Stalls in Treatment: 11 Constitutional Hypertensive, asymptomatic. . . . no acute distress. Respiratory Normal work of breathing on supplemental oxygen. Notes 04/18/2023: This is a 30-day visit in order to establish ongoing need for lymphedema pumps. She has been wearing her Farrow wraps and there has been no recurrence of her wounds. She continues to experience skin changes and bilateral lower extremity edema consistent with her severe stage III lymphedema. She is unable to exercise secondary to other medical comorbidities including tracheostomy dependence and chronic nasal cannula oxygen use. She will benefit from long-term use of lymphedema pumps to prevent future rewounding events. Electronic Signature(s) Signed: 04/18/2023 11:29:04 AM By: Duanne Guess MD FACS Entered By: Duanne Guess on 04/18/2023 08:29:04 -------------------------------------------------------------------------------- Physician Orders Details Patient Name: Date of Service: [name] [name], [name] 04/18/2023 11:00 A M Medical Record Number: [id number] Patient Account Number: [id number] Date of Birth/Sex: Treating RN: April 29, 1981 (42 y.o. Katrinka Blazing Primary Care Provider: Alysia Penna Other Clinician: Referring Provider: Treating Provider/Extender: Sherlynn Stalls in Treatment: 2 Verbal / Phone Orders: No Diagnosis Coding ICD-10 Coding Code Description [phone] Non-pressure chronic ulcer of other part of left lower leg with fat layer exposed E11.622 Type 2 diabetes mellitus with other skin ulcer I89.0 Lymphedema, not elsewhere  classified I87.2 Venous insufficiency (chronic) (peripheral) I50.32 Chronic diastolic (congestive) heart failure E66.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 Z93.0 Tracheostomy status Z99.81 Dependence on supplemental oxygen Discharge From [facility] Services Discharge from Wound Care Center - Congratulations no wounds! See you in 6 months! Electronic Signature(s) Signed: 04/18/2023 11:29:14 AM By: Duanne Guess MD FACS Gillentine, [name] ([id number]) [id number] Page 3 of 7 Signed: 04/18/2023 11:29:14 AM By: Duanne Guess MD FACS Entered By: Duanne Guess on 04/18/2023 08:29:13 -------------------------------------------------------------------------------- Problem List Details Patient Name: Date of Service: [name] [name], [name] 04/18/2023 11:00 A M Medical Record Number: [id number] Patient Account Number: [id number] Date of Birth/Sex: Treating RN: October 15, 1980 (42 y.o. 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/11/2022 No Yes E11.622 Type 2 diabetes mellitus with other skin ulcer 01/25/2023 No Yes I89.0 Lymphedema, not elsewhere classified 01/25/2023 No Yes I87.2 Venous insufficiency (chronic) (peripheral) 01/25/2023 No Yes I50.32 Chronic diastolic (congestive) heart failure 01/25/2023 No Yes E66.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 01/25/2023 No Yes Z93.0 Tracheostomy status 01/25/2023 No Yes Z99.81 Dependence on supplemental oxygen 01/25/2023 No Yes Inactive Problems Resolved Problems Electronic Signature(s) Signed: 04/18/2023 11:24:14 AM By: Duanne Guess MD FACS Entered By: Duanne Guess on 04/18/2023 08:24:13 Badgett, [name] ([id number])  [id number] Page 4 of 7 -------------------------------------------------------------------------------- Progress Note Details Patient Name: Date of Service: [name] [name], [name] [place] S. 04/18/2023 11:00 A M Medical Record Number: [id number] Patient Account Number: [id number] Date of Birth/Sex: Treating RN: 16-Aug-1981 (42 y.o. F) Primary Care Provider: Alysia Penna Other Clinician: Referring Provider: Treating Provider/Extender: Sherlynn Stalls in Treatment: 11 Subjective Chief Complaint Information obtained from Patient Patient presents for treatment of an open ulcer due to venous insufficiency in the setting of severe stage 3 lymphedema History of Present Illness (HPI) ADMISSION 01/25/2023 This is a 41 year old type II diabetic (last hemoglobin A1c 6.9%) who is super morbidly obese and relies on a tracheostomy as well as oxygen via nasal cannula during the  day. She has congestive heart failure and the skin changes in her legs are consistent with lymphedema. She was referred to [place] for venous stasis dermatitis however she has circumferential breakdown of the skin on her left leg. There is thick slough formation. ABIs were noncompressible, secondary to the lymphedema, but she does have a palpable dorsalis pedis pulse. 02/01/2023: The wound is covered with a thick layer of yellowish-gray slimy slough and has a strong putrid odor. 02/10/2023: The wound is less malodorous today and has less slime on the surface, but there is still slough present and a persistent odor. The culture that I took last week was polymicrobial with predominant species including Morganella and a plethora of skin flora. Both Augmentin and Bactrim were recommended as treatment in combination and these were prescribed. Unfortunately, she is unable to afford Keystone topical antibiotic compound. She is tolerating her oral therapy well, however. 02/16/2023: The odor has completely  abated and the wound surfaces are clean and dry. There is just a very small remaining open area on her posteromedial leg. She does not have compression garments, nor does she have lymphedema pumps. 02/28/2023: The remaining open area on her posteromedial leg is down to around a millimeter. Still no word regarding her lymphedema pumps or compression garments. 03/14/23: Healed, has farrow wraps with her today. Waiting 30 days for pumps. 04/18/2023: This is a 30-day visit in order to establish ongoing need for lymphedema pumps. She has been wearing her Farrow wraps and there has been no recurrence of her wounds. She continues to experience skin changes and bilateral lower extremity edema consistent with her severe stage III lymphedema. She is unable to exercise secondary to other medical comorbidities including tracheostomy dependence and chronic nasal cannula oxygen use. She will benefit from long-term use of lymphedema pumps to prevent future rewounding events. Patient History Information obtained from Patient. Family History Cancer - Paternal Grandparents, Diabetes - Mother, Heart Disease - Maternal Grandparents, Hypertension - Mother, No family history of Kidney Disease, Lung Disease, Seizures, Stroke, Thyroid Problems, Tuberculosis. Social History Never smoker, Marital Status - Single, Alcohol Use - Never, Drug Use - No History, Caffeine Use - Never. Medical History Hematologic/Lymphatic Patient has history of Lymphedema Respiratory Patient has history of Sleep Apnea Cardiovascular Patient has history of Hypertension, Peripheral Venous Disease Endocrine Patient has history of Type II Diabetes Integumentary (Skin) Denies history of History of Burn Hospitalization/Surgery History - tracheostomy 2017. Medical A Surgical History Notes nd Respiratory chronic respiratory failure tracheostomy dependent Cardiovascular diastolic heart failure pericardial effusion pulmonary  hypertension Gastrointestinal hepatic congestion dysphagia Endocrine hyperlipidemia, morbid obesity Lyday, Kevia S ([id number]) [id number] Page 5 of 7 Objective Constitutional Hypertensive, asymptomatic. no acute distress. Vitals Time Taken: 10:56 AM, Height: 65 in, Weight: 430 lbs, BMI: 71.5, Temperature: 98.5 F, Pulse: 86 bpm, Respiratory Rate: 20 breaths/min, Blood Pressure: 150/85 mmHg. Respiratory Normal work of breathing on supplemental oxygen. General Notes: 04/18/2023: This is a 30-day visit in order to establish ongoing need for lymphedema pumps. She has been wearing her Farrow wraps and there has been no recurrence of her wounds. She continues to experience skin changes and bilateral lower extremity edema consistent with her severe stage III lymphedema. She is unable to exercise secondary to other medical comorbidities including tracheostomy dependence and chronic nasal cannula oxygen use. She will benefit from long-term use of lymphedema pumps to prevent future rewounding events. 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 See you in 6 months! 04/18/2023: This is a 30-day visit in order to establish ongoing need for lymphedema pumps. She has been wearing her Farrow wraps and there has been no recurrence of her wounds. She continues to experience skin changes and bilateral lower extremity edema consistent with her severe stage III lymphedema. She is unable to exercise secondary to other medical  comorbidities including tracheostomy dependence and chronic nasal cannula oxygen use. She will benefit from long-term use of lymphedema pumps to prevent future rewounding events. We will fill out any additional necessary paperwork so that she can get her pumps. She is enrolled in the secondary prevention program so we will have her back in 6 months for recheck, barring any new wound formation in the interim. Electronic Signature(s) Signed: 04/18/2023 11:31:12 AM By: Duanne Guess MD FACS Entered By: Duanne Guess on 04/18/2023 08:31:12 -------------------------------------------------------------------------------- HxROS Details Patient Name: Date of Service: [name], [name] 04/18/2023 11:00 A M Medical Record Number: [id number] Patient Account Number: [id number] Date of Birth/Sex: Treating RN: [birth year] (42 y.o. F) Primary Care Provider: Alysia Penna Other Clinician: Referring Provider: Treating Provider/Extender: Sherlynn Stalls in Treatment: [address] Information Obtained From [name] [name], [name] [name] ([id number]) [id number] Page 6 of 7 Patient Hematologic/Lymphatic Medical History: Positive for: Lymphedema Respiratory Medical History: Positive for: Sleep Apnea Past Medical History Notes: chronic respiratory failure tracheostomy dependent Cardiovascular Medical History: Positive for: Hypertension; Peripheral Venous Disease Past Medical History Notes: diastolic heart failure pericardial effusion pulmonary hypertension Gastrointestinal Medical History: Past Medical History Notes: hepatic congestion dysphagia Endocrine Medical History: Positive for: Type II Diabetes Past Medical History Notes: hyperlipidemia, morbid obesity Time with diabetes: <1 month Treated with: Oral agents Blood sugar tested every day: No Integumentary (Skin) Medical History: Negative for: History of Burn Immunizations Pneumococcal  Vaccine: Received Pneumococcal Vaccination: No Implantable Devices None Hospitalization / Surgery History Type of Hospitalization/Surgery tracheostomy 2017 Family and Social History Cancer: Yes - Paternal Grandparents; Diabetes: Yes - Mother; Heart Disease: Yes - Maternal Grandparents; Hypertension: Yes - Mother; Kidney Disease: No; Lung Disease: No; Seizures: No; Stroke: No; Thyroid Problems: No; Tuberculosis: No; Never smoker; Marital Status - Single; Alcohol Use: Never; Drug Use: No History; Caffeine Use: Never; Financial Concerns: No; Food, Clothing or Shelter Needs: No; Support System Lacking: No; Transportation Concerns: No Electronic Signature(s) Signed: 04/18/2023 11:49:36 AM By: Duanne Guess MD FACS Entered By: Duanne Guess on 04/18/2023 08:28:31 SuperBill Details -------------------------------------------------------------------------------- Clovia Cuff ([id number]) [id number] Page 7 of 7 Patient Name: Date of Service: [name] [name], [name] [place] 04/18/2023 Medical Record Number: [id number] Patient Account Number: [id number] Date of Birth/Sex: Treating RN: 11/29/1980 (42 y.o. Katrinka Blazing Primary Care Provider: Alysia Penna Other Clinician: Referring Provider: Treating Provider/Extender: Sherlynn Stalls in Treatment: 11 Diagnosis Coding ICD-10 Codes Code Description [phone] Non-pressure chronic ulcer of other part of left lower leg with fat layer exposed E11.622 Type 2 diabetes mellitus with other skin ulcer I89.0 Lymphedema, not elsewhere classified I87.2 Venous insufficiency (chronic) (peripheral) I50.32 Chronic diastolic (congestive) heart failure E66.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 Z93.0 Tracheostomy status Z99.81 Dependence on supplemental oxygen Facility Procedures CPT4 Code Description Modifier Quantity [id number] - WOUND CARE VISIT-LEV 2 EST PT 1 Physician  Procedures Quantity CPT4 Code Description Modifier [id number] [phone] - WC PHYS LEVEL 2 - EST PT 1 ICD-10 Diagnosis Description I89.0 Lymphedema, not elsewhere classified I50.32 Chronic diastolic (congestive) heart failure I87.2 Venous insufficiency (chronic) (peripheral) Z99.81 Dependence on supplemental oxygen Electronic Signature(s) Signed: 04/18/2023 11:31:39 AM By: Duanne Guess MD FACS Entered By: Duanne Guess on 04/18/2023 08:31:38

## 2023-05-07 ENCOUNTER — Other Ambulatory Visit: Payer: Self-pay | Admitting: Cardiology

## 2023-05-12 ENCOUNTER — Other Ambulatory Visit: Payer: Self-pay | Admitting: Cardiology

## 2023-05-13 ENCOUNTER — Telehealth: Payer: Self-pay | Admitting: Nurse Practitioner

## 2023-05-13 ENCOUNTER — Ambulatory Visit: Payer: Medicare Other | Admitting: Nurse Practitioner

## 2023-05-13 NOTE — Telephone Encounter (Signed)
1st no show, letter sent via mail

## 2023-05-13 NOTE — Telephone Encounter (Signed)
05/13/23 - No show letter sent

## 2023-05-20 ENCOUNTER — Other Ambulatory Visit: Payer: Self-pay | Admitting: Cardiology

## 2023-07-01 ENCOUNTER — Other Ambulatory Visit: Payer: Self-pay | Admitting: Nurse Practitioner

## 2023-07-01 DIAGNOSIS — E1169 Type 2 diabetes mellitus with other specified complication: Secondary | ICD-10-CM

## 2023-07-09 ENCOUNTER — Other Ambulatory Visit: Payer: Self-pay | Admitting: Nurse Practitioner

## 2023-07-09 DIAGNOSIS — E1169 Type 2 diabetes mellitus with other specified complication: Secondary | ICD-10-CM

## 2023-07-11 NOTE — Telephone Encounter (Signed)
Refill request for  Rybelus 3 mg LR 01/09/23, #90, 1 rf LOB  02/10/23 FOV  04/06/24  Please review and advise.  Thanks  Dm/cma

## 2023-08-05 ENCOUNTER — Other Ambulatory Visit: Payer: Self-pay | Admitting: Nurse Practitioner

## 2023-08-05 DIAGNOSIS — E1169 Type 2 diabetes mellitus with other specified complication: Secondary | ICD-10-CM

## 2023-08-06 ENCOUNTER — Other Ambulatory Visit: Payer: Self-pay | Admitting: Nurse Practitioner

## 2023-08-06 DIAGNOSIS — E1169 Type 2 diabetes mellitus with other specified complication: Secondary | ICD-10-CM

## 2023-08-06 DIAGNOSIS — E0821 Diabetes mellitus due to underlying condition with diabetic nephropathy: Secondary | ICD-10-CM

## 2023-08-13 ENCOUNTER — Other Ambulatory Visit: Payer: Self-pay | Admitting: Cardiology

## 2023-08-13 DIAGNOSIS — I10 Essential (primary) hypertension: Secondary | ICD-10-CM

## 2023-08-13 DIAGNOSIS — I5032 Chronic diastolic (congestive) heart failure: Secondary | ICD-10-CM

## 2023-08-25 ENCOUNTER — Other Ambulatory Visit: Payer: Self-pay | Admitting: Nurse Practitioner

## 2023-08-25 ENCOUNTER — Other Ambulatory Visit: Payer: Self-pay | Admitting: Cardiology

## 2023-08-25 DIAGNOSIS — E1169 Type 2 diabetes mellitus with other specified complication: Secondary | ICD-10-CM

## 2023-08-26 ENCOUNTER — Telehealth: Payer: Self-pay | Admitting: Nurse Practitioner

## 2023-08-26 ENCOUNTER — Other Ambulatory Visit: Payer: Self-pay

## 2023-08-26 DIAGNOSIS — E1169 Type 2 diabetes mellitus with other specified complication: Secondary | ICD-10-CM

## 2023-08-26 MED ORDER — RYBELSUS 3 MG PO TABS: 1.0000 | ORAL_TABLET | Freq: Every day | ORAL | 0 refills | Status: DC

## 2023-08-26 NOTE — Telephone Encounter (Signed)
 Copied from CRM [id number]. Topic: Clinical - Medication Question >> Aug 26, 2023  2:57 PM [name] [name] wrote: Reason for CRM: Patient wants to know if she can be prescribed RYBELSUS  for a certain amount of days until her appointment 09/05/2023 because she is out.    Sent in prescription to pharmacy and will call to let her know.  Dm/cma

## 2023-08-26 NOTE — Telephone Encounter (Signed)
 Patient is needing her medication Semaglutide (RYBELSUS) 3 MG TABS  refilled

## 2023-09-03 ENCOUNTER — Other Ambulatory Visit: Payer: Self-pay | Admitting: Nurse Practitioner

## 2023-09-03 DIAGNOSIS — E0821 Diabetes mellitus due to underlying condition with diabetic nephropathy: Secondary | ICD-10-CM

## 2023-09-03 DIAGNOSIS — E1169 Type 2 diabetes mellitus with other specified complication: Secondary | ICD-10-CM

## 2023-09-05 ENCOUNTER — Ambulatory Visit: Payer: Medicare Other | Admitting: Nurse Practitioner

## 2023-09-05 ENCOUNTER — Telehealth: Payer: Self-pay | Admitting: Nurse Practitioner

## 2023-09-05 NOTE — Telephone Encounter (Signed)
 05/13/2023 no show 09/05/2023 same day cancel/not feeling well  Final warning sent via mail and mychart

## 2023-09-05 NOTE — Telephone Encounter (Signed)
 Copied from CRM [id number]. Topic: Appointments - Scheduling Inquiry for Clinic >> Sep 05, 2023  8:18 AM Dennison Nancy wrote: Reason for CRM: patient cancelling same day appointment 09/05/23 at  10:00 reason not feeling well

## 2023-09-07 ENCOUNTER — Other Ambulatory Visit: Payer: Self-pay | Admitting: Nurse Practitioner

## 2023-09-07 DIAGNOSIS — E1169 Type 2 diabetes mellitus with other specified complication: Secondary | ICD-10-CM

## 2023-09-07 DIAGNOSIS — E0821 Diabetes mellitus due to underlying condition with diabetic nephropathy: Secondary | ICD-10-CM

## 2023-09-08 ENCOUNTER — Ambulatory Visit: Payer: Medicare Other | Admitting: Family Medicine

## 2023-09-08 NOTE — Telephone Encounter (Signed)
Pt scheduled appt with Claris Gower for 09/14/23.

## 2023-09-09 ENCOUNTER — Other Ambulatory Visit: Payer: Self-pay | Admitting: Cardiology

## 2023-09-09 DIAGNOSIS — I10 Essential (primary) hypertension: Secondary | ICD-10-CM

## 2023-09-14 ENCOUNTER — Ambulatory Visit: Payer: Self-pay | Admitting: Nurse Practitioner

## 2023-09-14 ENCOUNTER — Ambulatory Visit: Payer: Medicare Other | Admitting: Nurse Practitioner

## 2023-09-14 ENCOUNTER — Other Ambulatory Visit: Payer: Self-pay | Admitting: Nurse Practitioner

## 2023-09-14 DIAGNOSIS — E1169 Type 2 diabetes mellitus with other specified complication: Secondary | ICD-10-CM

## 2023-09-14 DIAGNOSIS — E0821 Diabetes mellitus due to underlying condition with diabetic nephropathy: Secondary | ICD-10-CM

## 2023-09-14 MED ORDER — LISINOPRIL 2.5 MG PO TABS: 2.5000 mg | ORAL_TABLET | Freq: Every day | ORAL | 0 refills | Status: DC

## 2023-09-14 NOTE — Telephone Encounter (Signed)
Pt has appt scheduled 02/05, rescheduled d/t weather and pt being more than 1 hour drive time from office. Pt is requesting refills sent to [facility] for Lisinopril to get to appt date.  Reason for Disposition  [1] Caller has NON-URGENT medicine question about med that PCP prescribed AND [2] triager unable to answer question  Answer Assessment - Initial Assessment Questions 1. NAME of MEDICINE: "What medicine(s) are you calling about?"     Lisinopril 2. QUESTION: "What is your question?" (e.g., double dose of medicine, side effect)     Pt would like a refill sent until her appt date 02/05 3. PRESCRIBER: "Who prescribed the medicine?" Reason: if prescribed by specialist, call should be referred to that group.     PCP 4. SYMPTOMS: "Do you have any symptoms?" If Yes, ask: "What symptoms are you having?"  "How bad are the symptoms (e.g., mild, moderate, severe)     None  Protocols used: Medication Question Call-A-AH

## 2023-09-14 NOTE — Addendum Note (Signed)
Addended by: Michaela Corner on: 09/14/2023 03:05 PM   Modules accepted: Orders

## 2023-09-14 NOTE — Telephone Encounter (Signed)
Attempt #1. Tried to reach patient regarding need for appt for BP medication renewal. Received no answer. LVM for call back.

## 2023-09-14 NOTE — Telephone Encounter (Signed)
Copied from CRM [id number]. Topic: Clinical - Medication Refill >> Sep 14, 2023  8:55 AM Almira Coaster wrote: Reason for CRM: Patient is calling for a refill on lisinopril (ZESTRIL) 2.5 MG tablet

## 2023-09-22 ENCOUNTER — Other Ambulatory Visit: Payer: Self-pay | Admitting: Nurse Practitioner

## 2023-09-22 DIAGNOSIS — E1169 Type 2 diabetes mellitus with other specified complication: Secondary | ICD-10-CM

## 2023-09-22 DIAGNOSIS — E0821 Diabetes mellitus due to underlying condition with diabetic nephropathy: Secondary | ICD-10-CM

## 2023-09-24 ENCOUNTER — Other Ambulatory Visit: Payer: Self-pay | Admitting: Nurse Practitioner

## 2023-09-24 DIAGNOSIS — E1169 Type 2 diabetes mellitus with other specified complication: Secondary | ICD-10-CM

## 2023-09-28 ENCOUNTER — Encounter: Payer: Self-pay | Admitting: Nurse Practitioner

## 2023-09-28 ENCOUNTER — Ambulatory Visit: Payer: Medicare Other | Admitting: Nurse Practitioner

## 2023-09-28 VITALS — BP 136/92 | HR 83 | Temp 98.3°F | Ht 65.0 in | Wt >= 6400 oz

## 2023-09-28 DIAGNOSIS — E785 Hyperlipidemia, unspecified: Secondary | ICD-10-CM | POA: Diagnosis not present

## 2023-09-28 DIAGNOSIS — Z930 Tracheostomy status: Secondary | ICD-10-CM

## 2023-09-28 DIAGNOSIS — Z7984 Long term (current) use of oral hypoglycemic drugs: Secondary | ICD-10-CM | POA: Diagnosis not present

## 2023-09-28 DIAGNOSIS — E1169 Type 2 diabetes mellitus with other specified complication: Secondary | ICD-10-CM

## 2023-09-28 DIAGNOSIS — J479 Bronchiectasis, uncomplicated: Secondary | ICD-10-CM

## 2023-09-28 DIAGNOSIS — Z23 Encounter for immunization: Secondary | ICD-10-CM

## 2023-09-28 DIAGNOSIS — E1121 Type 2 diabetes mellitus with diabetic nephropathy: Secondary | ICD-10-CM

## 2023-09-28 DIAGNOSIS — I5032 Chronic diastolic (congestive) heart failure: Secondary | ICD-10-CM | POA: Diagnosis not present

## 2023-09-28 DIAGNOSIS — E0869 Diabetes mellitus due to underlying condition with other specified complication: Secondary | ICD-10-CM

## 2023-09-28 LAB — MICROALBUMIN / CREATININE URINE RATIO
Creatinine,U: 51.2 mg/dL
Microalb Creat Ratio: 266.5 mg/g — ABNORMAL HIGH (ref 0.0–30.0)
Microalb, Ur: 136.6 mg/dL — ABNORMAL HIGH (ref 0.0–1.9)

## 2023-09-28 LAB — COMPREHENSIVE METABOLIC PANEL
ALT: 13 U/L (ref 0–35)
AST: 16 U/L (ref 0–37)
Albumin: 3.5 g/dL (ref 3.5–5.2)
Alkaline Phosphatase: 105 U/L (ref 39–117)
BUN: 18 mg/dL (ref 6–23)
CO2: 35 meq/L — ABNORMAL HIGH (ref 19–32)
Calcium: 9.1 mg/dL (ref 8.4–10.5)
Chloride: 96 meq/L (ref 96–112)
Creatinine, Ser: 0.93 mg/dL (ref 0.40–1.20)
GFR: 74.92 mL/min (ref >60.00)
Glucose, Bld: 109 mg/dL — ABNORMAL HIGH (ref 70–99)
Potassium: 4.1 meq/L (ref 3.5–5.1)
Sodium: 139 meq/L (ref 135–145)
Total Bilirubin: 0.5 mg/dL (ref 0.2–1.2)
Total Protein: 8 g/dL (ref 6.0–8.3)

## 2023-09-28 LAB — LIPID PANEL
Cholesterol: 178 mg/dL (ref 0–200)
HDL: 57.9 mg/dL (ref >39.00)
LDL Cholesterol: 105 mg/dL — ABNORMAL HIGH (ref 0–99)
NonHDL: 119.99
Total CHOL/HDL Ratio: 3
Triglycerides: 75 mg/dL (ref 0.0–149.0)
VLDL: 15 mg/dL (ref 0.0–40.0)

## 2023-09-28 LAB — HEMOGLOBIN A1C: Hgb A1c MFr Bld: 6.6 % — ABNORMAL HIGH (ref 4.6–6.5)

## 2023-09-28 MED ORDER — LISINOPRIL 5 MG PO TABS: 5.0000 mg | ORAL_TABLET | Freq: Every day | ORAL | 1 refills | Status: DC

## 2023-09-28 MED ORDER — RYBELSUS 3 MG PO TABS: 1.0000 | ORAL_TABLET | Freq: Every day | ORAL | 0 refills | Status: DC

## 2023-09-28 NOTE — Assessment & Plan Note (Signed)
 Reports diet modification: high protein and fiber diet Had previous appointment with nutritionist. Declined need for another appt Exercise: home video and walking 3x/week. No weight loss noted today Current use of rybelsus  3mg  Wt Readings from Last 3 Encounters:  09/28/23 (!) 428 lb 12.8 oz (194.5 kg)  03/09/23 (!) 432 lb (196 kg)  02/10/23 (!) 423 lb 6.4 oz (192.1 kg)    Advised to schedule appointment with bariatric clinic Encouraged to maintain daily exercise F/up 3months

## 2023-09-28 NOTE — Progress Notes (Signed)
 Established Patient Visit  Patient: [name] [name]   DOB: 1980-11-05   43 y.o. Female  MRN: [id number] Visit Date: 09/28/2023  Subjective:    Chief Complaint  Patient presents with   Medication Management    Rx Refills, Request an increase in Rybelsus , FluVaccine   HPI Chronic diastolic HF (heart failure) (HCC) Euvolemic today Current use of amlodipine , coreg , furosemide , hydralazine , and lisinopril  Advised to f/up with cardiology Wt Readings from Last 3 Encounters:  09/28/23 (!) 428 lb 12.8 oz (194.5 kg)  03/09/23 (!) 432 lb (196 kg)  02/10/23 (!) 423 lb 6.4 oz (192.1 kg)     Bronchiectasis without complication (HCC) Under the care of pulmonology, Trach in place and oxygen dependent-2l Denies any respiratory distress today, no cough or mucus production  DM (diabetes mellitus) (HCC) No adverse effect with rybelsus  3mg  No glucose check at home. Advised to schedule Dm eye exam with ophthalmology UACr at 48, current use of lisinopril  2.5mg .  Maintain med dose repeat UACr, CMP. Lipid panel, and hgbA1c F/up in 76month  Diabetic nephropathy associated with type 2 diabetes mellitus (HCC) Previous UACr at 48, current use of rybelsus  3mg  and lisinopril  2.5mg . Normal Gfr and creatinine  Repeat CMP and UACr today  Hyperlipidemia associated with type 2 diabetes mellitus (HCC) Repeat Lipid panel Current use of lipitor with no adverse effects  Morbid obesity (HCC) Reports diet modification: high protein and fiber diet Had previous appointment with nutritionist. Declined need for another appt Exercise: home video and walking 3x/week. No weight loss noted today Current use of rybelsus  3mg  Wt Readings from Last 3 Encounters:  09/28/23 (!) 428 lb 12.8 oz (194.5 kg)  03/09/23 (!) 432 lb (196 kg)  02/10/23 (!) 423 lb 6.4 oz (192.1 kg)    Advised to schedule appointment with bariatric clinic Encouraged to maintain daily exercise F/up 76months  Tracheostomy  dependence (HCC) Followed by pulmonology and trach clinic    Reviewed medical, surgical, and social history today  Medications: Outpatient Medications Prior to Visit  Medication Sig   amLODipine  (NORVASC ) 5 MG tablet TAKE 1 TABLET (5 MG TOTAL) BY MOUTH DAILY.   atorvastatin  (LIPITOR) 20 MG tablet TAKE 1 TABLET BY MOUTH EVERY DAY   carvedilol  (COREG ) 25 MG tablet TAKE 1 TABLET BY MOUTH TWICE A DAY   citalopram  (CELEXA ) 10 MG tablet TAKE 1 TABLET BY MOUTH EVERY DAY   furosemide  (LASIX ) 40 MG tablet TAKE 1 TABLET BY MOUTH EVERY DAY   hydrALAZINE  (APRESOLINE ) 100 MG tablet TAKE 1 TABLET BY MOUTH 3 TIMES DAILY.   isosorbide  dinitrate (ISORDIL ) 30 MG tablet TAKE 1 TABLET BY MOUTH THREE TIMES A DAY   [DISCONTINUED] lisinopril  (ZESTRIL ) 2.5 MG tablet Take 1 tablet (2.5 mg total) by mouth daily. No additional refill without office visit   [DISCONTINUED] Semaglutide  (RYBELSUS ) 3 MG TABS Take 1 tablet (3 mg total) by mouth daily. No additional refills without office visit   No facility-administered medications prior to visit.   Reviewed past medical and social history.   ROS per HPI above      Objective:  BP (!) 136/92 (BP Location: Left Wrist, Patient Position: Sitting, Cuff Size: Large)   Pulse 83   Temp 98.3 F (36.8 C)   Ht 5' 5 (1.651 m)   Wt (!) 428 lb 12.8 oz (194.5 kg)   SpO2 96%   BMI 71.36 kg/m      Physical Exam  Vitals and nursing note reviewed.  Cardiovascular:     Rate and Rhythm: Normal rate and regular rhythm.     Pulses: Normal pulses.     Heart sounds: Normal heart sounds.  Pulmonary:     Effort: Pulmonary effort is normal.     Breath sounds: Normal breath sounds.  Musculoskeletal:     Right lower leg: No edema.     Left lower leg: No edema.  Neurological:     Mental Status: She is alert and oriented to person, place, and time.  Psychiatric:        Mood and Affect: Mood normal.        Behavior: Behavior normal.        Thought Content: Thought content  normal.     No results found for any visits on 09/28/23.    Assessment & Plan:    Problem List Items Addressed This Visit     Bronchiectasis without complication (HCC)   Under the care of pulmonology, Trach in place and oxygen dependent-2l Denies any respiratory distress today, no cough or mucus production      Chronic diastolic HF (heart failure) (HCC)   Euvolemic today Current use of amlodipine , coreg , furosemide , hydralazine , and lisinopril  Advised to f/up with cardiology Wt Readings from Last 3 Encounters:  09/28/23 (!) 428 lb 12.8 oz (194.5 kg)  03/09/23 (!) 432 lb (196 kg)  02/10/23 (!) 423 lb 6.4 oz (192.1 kg)         Relevant Medications   lisinopril  (ZESTRIL ) 5 MG tablet   Diabetic nephropathy associated with type 2 diabetes mellitus (HCC)   Previous UACr at 48, current use of rybelsus  3mg  and lisinopril  2.5mg . Normal Gfr and creatinine  Repeat CMP and UACr today      Relevant Medications   Semaglutide  (RYBELSUS ) 3 MG TABS   lisinopril  (ZESTRIL ) 5 MG tablet   DM (diabetes mellitus) (HCC)   No adverse effect with rybelsus  3mg  No glucose check at home. Advised to schedule Dm eye exam with ophthalmology UACr at 48, current use of lisinopril  2.5mg .  Maintain med dose repeat UACr, CMP. Lipid panel, and hgbA1c F/up in 30month      Relevant Medications   Semaglutide  (RYBELSUS ) 3 MG TABS   lisinopril  (ZESTRIL ) 5 MG tablet   Other Relevant Orders   Hemoglobin A1c   Comprehensive metabolic panel   Microalbumin / creatinine urine ratio   Hyperlipidemia associated with type 2 diabetes mellitus (HCC)   Repeat Lipid panel Current use of lipitor with no adverse effects      Relevant Medications   Semaglutide  (RYBELSUS ) 3 MG TABS   lisinopril  (ZESTRIL ) 5 MG tablet   Other Relevant Orders   Lipid panel   Morbid obesity (HCC)   Reports diet modification: high protein and fiber diet Had previous appointment with nutritionist. Declined need for another  appt Exercise: home video and walking 3x/week. No weight loss noted today Current use of rybelsus  3mg  Wt Readings from Last 3 Encounters:  09/28/23 (!) 428 lb 12.8 oz (194.5 kg)  03/09/23 (!) 432 lb (196 kg)  02/10/23 (!) 423 lb 6.4 oz (192.1 kg)    Advised to schedule appointment with bariatric clinic Encouraged to maintain daily exercise F/up 30months      Relevant Medications   Semaglutide  (RYBELSUS ) 3 MG TABS   Tracheostomy dependence (HCC)   Followed by pulmonology and trach clinic      Other Visit Diagnoses       Immunization due    -  Primary   Relevant Orders   Flu Vaccine Trivalent High Dose (Fluad) (Completed)   Pneumococcal conjugate vaccine 20-valent (Prevnar 20) (Completed)      Return in about 3 months (around 12/26/2023) for DM, hyperlipidemia (fasting).     Roselie Mood, NP

## 2023-09-28 NOTE — Assessment & Plan Note (Signed)
No adverse effect with rybelsus 3mg  No glucose check at home. Advised to schedule Dm eye exam with ophthalmology UACr at 48, current use of lisinopril 2.5mg .  Maintain med dose repeat UACr, CMP. Lipid panel, and hgbA1c F/up in 32month

## 2023-09-28 NOTE — Assessment & Plan Note (Addendum)
 Previous UACr at 48, current use of rybelsus  3mg  and lisinopril  2.5mg . Normal Gfr and creatinine  Repeat CMP and UACr today

## 2023-09-28 NOTE — Assessment & Plan Note (Signed)
 Repeat Lipid panel Current use of lipitor with no adverse effects

## 2023-09-28 NOTE — Assessment & Plan Note (Signed)
 Followed by pulmonology and trach clinic

## 2023-09-28 NOTE — Assessment & Plan Note (Signed)
Under the care of pulmonology, Trach in place and oxygen dependent-2l Denies any respiratory distress today, no cough or mucus production

## 2023-09-28 NOTE — Patient Instructions (Addendum)
 Go to lab Maintain Heart healthy diet and daily exercise. Maintain current medications. Schedule appointment with cardiology, GYN and ophthalmology.

## 2023-09-28 NOTE — Assessment & Plan Note (Signed)
 Euvolemic today Current use of amlodipine , coreg , furosemide , hydralazine , and lisinopril  Advised to f/up with cardiology Wt Readings from Last 3 Encounters:  09/28/23 (!) 428 lb 12.8 oz (194.5 kg)  03/09/23 (!) 432 lb (196 kg)  02/10/23 (!) 423 lb 6.4 oz (192.1 kg)

## 2023-09-29 ENCOUNTER — Encounter: Payer: Self-pay | Admitting: Nurse Practitioner

## 2023-09-29 MED ORDER — RYBELSUS 7 MG PO TABS: 7.0000 mg | ORAL_TABLET | Freq: Every day | ORAL | 1 refills | Status: DC

## 2023-09-29 NOTE — Addendum Note (Signed)
 Addended by: Kathrene Parents L on: 09/29/2023 04:00 PM   Modules accepted: Orders

## 2023-10-03 ENCOUNTER — Telehealth: Payer: Self-pay

## 2023-10-03 NOTE — Telephone Encounter (Signed)
 Copied from CRM [id number]. Topic: Clinical - Prescription Issue >> Oct 03, 2023 11:28 AM Isabell A wrote: Reason for CRM: Patient calling to check the status of prior authorization for Semaglutide  (RYBELSUS ) 7 MG TABS.  [facility], [facility] [place] phone # [phone].  Called them and got the information for insurance. BIN # [id number] PCN#[id number] Grppup # [id number] ID# [id number]  Submitted PA through cover my meds. Awaiting response from [facility]. Dm/cma   Key: [id number]

## 2023-10-03 NOTE — Telephone Encounter (Addendum)
 PA approved and pharmacy/patient notified VIA phone. [name] [name]

## 2023-10-07 ENCOUNTER — Telehealth (HOSPITAL_COMMUNITY): Payer: Self-pay | Admitting: Pharmacy Technician

## 2023-10-07 ENCOUNTER — Other Ambulatory Visit (HOSPITAL_COMMUNITY): Payer: Self-pay

## 2023-10-07 NOTE — Telephone Encounter (Signed)
Pharmacy Patient Advocate Encounter   Received notification from CoverMyMeds that prior authorization for RYBELSUS 7MG  is required/requested.   Insurance verification completed.   The patient is insured through [facility] .   Per test claim: Refill too soon. PA is not needed at this time. Medication was filled 10/02/2053. Next eligible fill date is 12/09/2023.

## 2023-11-22 ENCOUNTER — Other Ambulatory Visit: Payer: Self-pay | Admitting: Acute Care

## 2023-11-22 ENCOUNTER — Ambulatory Visit (HOSPITAL_COMMUNITY)
Admission: RE | Admit: 2023-11-22 | Discharge: 2023-11-22 | Disposition: A | Discharge status: Home / Self Care | Source: Ambulatory Visit | Attending: Acute Care | Admitting: Acute Care

## 2023-11-22 DIAGNOSIS — I5032 Chronic diastolic (congestive) heart failure: Secondary | ICD-10-CM | POA: Diagnosis present

## 2023-11-22 DIAGNOSIS — J302 Other seasonal allergic rhinitis: Secondary | ICD-10-CM | POA: Insufficient documentation

## 2023-11-22 DIAGNOSIS — J9612 Chronic respiratory failure with hypercapnia: Secondary | ICD-10-CM

## 2023-11-22 DIAGNOSIS — J9611 Chronic respiratory failure with hypoxia: Secondary | ICD-10-CM | POA: Diagnosis not present

## 2023-11-22 DIAGNOSIS — Z930 Tracheostomy status: Secondary | ICD-10-CM | POA: Diagnosis not present

## 2023-11-22 DIAGNOSIS — G4733 Obstructive sleep apnea (adult) (pediatric): Secondary | ICD-10-CM | POA: Diagnosis not present

## 2023-11-22 DIAGNOSIS — I272 Pulmonary hypertension, unspecified: Secondary | ICD-10-CM | POA: Diagnosis present

## 2023-11-22 DIAGNOSIS — E66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: Secondary | ICD-10-CM | POA: Diagnosis present

## 2023-11-22 MED ORDER — FLUTICASONE PROPIONATE 50 MCG/ACT NA SUSP: 2.0000 | Freq: Every day | NASAL | 3 refills | Status: AC

## 2023-11-22 NOTE — Progress Notes (Signed)
 Reason for visit  Trach change  HPI This is a 43 year old female well known to me. 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 She had been close to decannulation but never could really tolerate CPAP. Her last hospital stay was last year in 2024 for decomp HF and after aggressive diuresis was sent home on increased oxygen. I saw her last almost an entire year ago. Presents today for trach change. Also reporting increased nasal congestion w/ pollen.   Review of Systems  Constitutional: Negative.   HENT:  Positive for congestion and sore throat. Negative for nosebleeds.   Eyes: Negative.   Respiratory:  Positive for cough and sputum production.   Cardiovascular: Negative.   Gastrointestinal: Negative.   Genitourinary: Negative.   Musculoskeletal: Negative.   Skin: Negative.   Neurological: Negative.   Endo/Heme/Allergies: Negative.    Exam   General this is a 43 year old female who presents to clinic w/ no acute distress HENT # 6 cuffless trach. Thick yellow foul smelling biofilm stuck to trach voice is raspy  Pulm dec'd bases Card rrr Abd soft Ext warm dep edema    Trach change The present tracheostomy was removed the tracheostomy site was inspected and was found to be unremarkable w/ exception of biofilm residue on trach mentioned in PE.  Following this a  size 6 tracheostomy was placed over obturator.  Patient tolerated well placement verified via end-tidal CO2 She was given fresh PMV  Active Problems:   Tracheostomy dependence (HCC)   Morbid obesity (HCC)   OSA (obstructive sleep apnea)   Pulmonary hypertension, unspecified (HCC)   Chronic respiratory failure with hypoxia and hypercapnia (HCC)   Chronic diastolic HF (heart failure) (HCC)   Hypoventilation associated with obesity syndrome (HCC)   Seasonal allergies  Seasonal allergies Overview: Plan Recommended H2B if choice OTC  Sent script for Flonase nasal    Tracheostomy  dependence (HCC) Overview: Trach: # 6 cuffless shiley Last change today 11/22/23 (note had been 11 months since last change)   Discussion From tracheostomy standpoint she is stable.  Not currently candidate for decannulation given she has been intolerant of CPAP, and is seeing a decline in her overall health specifically her weight and exertional dyspnea  Plan Cont routine trach care Continue supplemental oxygen Return in 12 weeks for trach change       My x 16 min   Simonne Martinet ACNP-BC [facility] Pulmonary/Critical Care Pager # [phone] OR # [phone] if no answer

## 2023-11-22 NOTE — Procedures (Signed)
 Tracheostomy Change Note  Patient Details:   Name: [name] [name] DOB: [birth year] MRN: [id number]    Airway Documentation:# 6 uncuffed trach changed with same size by P.Babcock, NP. Pt tolerated procedure well with good end-tidal color change. New PMV placed and patient phonating well. Extra split gauze and spare trach sent with patient. No issues noted at time of trach change.      Evaluation  O2 sats: stable throughout Complications: No apparent complications Patient did tolerate procedure well.      Shanda Bumps 11/22/2023, 1:36 PM

## 2023-12-26 ENCOUNTER — Ambulatory Visit: Payer: Medicare Other | Admitting: Nurse Practitioner

## 2024-01-04 ENCOUNTER — Other Ambulatory Visit: Payer: Self-pay | Admitting: Nurse Practitioner

## 2024-01-04 DIAGNOSIS — F3342 Major depressive disorder, recurrent, in full remission: Secondary | ICD-10-CM

## 2024-01-20 ENCOUNTER — Ambulatory Visit: Admitting: Nurse Practitioner

## 2024-02-09 ENCOUNTER — Other Ambulatory Visit: Payer: Self-pay | Admitting: Cardiology

## 2024-02-09 DIAGNOSIS — I5032 Chronic diastolic (congestive) heart failure: Secondary | ICD-10-CM

## 2024-02-09 DIAGNOSIS — I10 Essential (primary) hypertension: Secondary | ICD-10-CM

## 2024-03-07 ENCOUNTER — Other Ambulatory Visit: Payer: Self-pay | Admitting: Cardiology

## 2024-03-07 DIAGNOSIS — I5032 Chronic diastolic (congestive) heart failure: Secondary | ICD-10-CM

## 2024-03-07 DIAGNOSIS — I10 Essential (primary) hypertension: Secondary | ICD-10-CM

## 2024-03-16 ENCOUNTER — Ambulatory Visit: Admitting: Nurse Practitioner

## 2024-03-16 ENCOUNTER — Encounter: Payer: Self-pay | Admitting: Nurse Practitioner

## 2024-03-16 DIAGNOSIS — E785 Hyperlipidemia, unspecified: Secondary | ICD-10-CM

## 2024-03-16 DIAGNOSIS — E1121 Type 2 diabetes mellitus with diabetic nephropathy: Secondary | ICD-10-CM

## 2024-03-16 DIAGNOSIS — Z6841 Body Mass Index (BMI) 40.0 and over, adult: Secondary | ICD-10-CM

## 2024-03-16 DIAGNOSIS — E1169 Type 2 diabetes mellitus with other specified complication: Secondary | ICD-10-CM | POA: Diagnosis not present

## 2024-03-16 DIAGNOSIS — E0869 Diabetes mellitus due to underlying condition with other specified complication: Secondary | ICD-10-CM

## 2024-03-16 DIAGNOSIS — Z7984 Long term (current) use of oral hypoglycemic drugs: Secondary | ICD-10-CM

## 2024-03-16 LAB — RENAL FUNCTION PANEL
Albumin: 3.7 g/dL (ref 3.5–5.2)
BUN: 16 mg/dL (ref 6–23)
CO2: 40 meq/L — ABNORMAL HIGH (ref 19–32)
Calcium: 9.2 mg/dL (ref 8.4–10.5)
Chloride: 92 meq/L — ABNORMAL LOW (ref 96–112)
Creatinine, Ser: 0.93 mg/dL (ref 0.40–1.20)
GFR: 74.68 mL/min (ref >60.00)
Glucose, Bld: 118 mg/dL — ABNORMAL HIGH (ref 70–99)
Phosphorus: 2.8 mg/dL (ref 2.3–4.6)
Potassium: 3.9 meq/L (ref 3.5–5.1)
Sodium: 141 meq/L (ref 135–145)

## 2024-03-16 LAB — LDL CHOLESTEROL, DIRECT: Direct LDL: 88 mg/dL

## 2024-03-16 LAB — HEMOGLOBIN A1C: Hgb A1c MFr Bld: 6.6 % — ABNORMAL HIGH (ref 4.6–6.5)

## 2024-03-16 MED ORDER — RYBELSUS 7 MG PO TABS: 7.0000 mg | ORAL_TABLET | Freq: Every day | ORAL | 1 refills | Status: DC

## 2024-03-16 MED ORDER — LISINOPRIL 5 MG PO TABS: 5.0000 mg | ORAL_TABLET | Freq: Every day | ORAL | 1 refills | Status: DC

## 2024-03-16 NOTE — Patient Instructions (Addendum)
 Go to lab Maintain Heart healthy diet and daily exercise. Maintain current medications. Schedule appointment with bariatric clinic [phone]), for mammogram [phone]) and with GYN [phone]) Schedule appointment for annual DIABETES eye exam

## 2024-03-16 NOTE — Assessment & Plan Note (Addendum)
 27lbs weight gain noted Admits to no exercise regimen and diet modification. Wt Readings from Last 3 Encounters:  03/16/24 (!) 441 lb (200 kg)  09/28/23 (!) 428 lb 12.8 oz (194.5 kg)  03/09/23 (!) 432 lb (196 kg)    Advised to schedule appointment with bariatric clinic, number provided Advised to follow diet modification provided by previous nutritionist and start daily exercise Maintain rybelsus  dose

## 2024-03-16 NOTE — Progress Notes (Signed)
 Established Patient Visit  Patient: [name] [name]   DOB: 1980-09-23   43 y.o. Female  MRN: [id number] Visit Date: 03/16/2024  Subjective:    Chief Complaint  Patient presents with   Follow-up    (FASTING) 3 month follow up for DM, Hyperlipidemia  Discuss weight gain    HPI Morbid obesity (HCC) 27lbs weight gain noted Admits to no exercise regimen and diet modification. Wt Readings from Last 3 Encounters:  03/16/24 (!) 441 lb (200 kg)  09/28/23 (!) 428 lb 12.8 oz (194.5 kg)  03/09/23 (!) 432 lb (196 kg)    Advised to schedule appointment with bariatric clinic, number provided Advised to follow diet modification provided by previous nutritionist and start daily exercise Maintain rybelsus  dose  Hyperlipidemia associated with type 2 diabetes mellitus (HCC) Repeat LDL Maintain atorvastatin  dose  DM (diabetes mellitus) (HCC) No adverse effect with rybelsus  7mg  No glucose check at home. Advised to schedule Dm eye exam with ophthalmology current use of lisinopril  2.5mg  and atorvastatin  Normal foot exam  Maintain med dose repeat UACr, bMP. LDL, and hgbA1c F/up in 50month   Reviewed medical, surgical, and social history today  Medications: Outpatient Medications Prior to Visit  Medication Sig   amLODipine  (NORVASC ) 5 MG tablet TAKE 1 TABLET (5 MG TOTAL) BY MOUTH DAILY.   atorvastatin  (LIPITOR) 20 MG tablet TAKE 1 TABLET BY MOUTH EVERY DAY   carvedilol  (COREG ) 25 MG tablet TAKE 1 TABLET BY MOUTH TWICE A DAY   citalopram  (CELEXA ) 10 MG tablet TAKE 1 TABLET BY MOUTH EVERY DAY   fluticasone  (FLONASE ) 50 MCG/ACT nasal spray Place 2 sprays into both nostrils daily.   furosemide  (LASIX ) 40 MG tablet TAKE 1 TABLET BY MOUTH EVERY DAY   hydrALAZINE  (APRESOLINE ) 100 MG tablet TAKE 1 TABLET BY MOUTH 3 TIMES DAILY.   isosorbide  dinitrate (ISORDIL ) 30 MG tablet TAKE 1 TABLET BY MOUTH THREE TIMES A DAY   [DISCONTINUED] lisinopril  (ZESTRIL ) 5 MG tablet Take 1 tablet  (5 mg total) by mouth daily. No additional refill without office visit   [DISCONTINUED] Semaglutide  (RYBELSUS ) 7 MG TABS Take 1 tablet (7 mg total) by mouth daily.   No facility-administered medications prior to visit.   Reviewed past medical and social history.   ROS per HPI above  Last metabolic panel Lab Results  Component Value Date   GLUCOSE 109 (H) 09/28/2023   NA 139 09/28/2023   K 4.1 09/28/2023   CL 96 09/28/2023   CO2 35 (H) 09/28/2023   BUN 18 09/28/2023   CREATININE 0.93 09/28/2023   GFR 74.92 09/28/2023   CALCIUM  9.1 09/28/2023   PHOS 4.3 02/19/2020   PROT 8.0 09/28/2023   ALBUMIN  3.5 09/28/2023   LABGLOB 4.3 03/22/2022   AGRATIO 0.9 (L) 03/22/2022   BILITOT 0.5 09/28/2023   ALKPHOS 105 09/28/2023   AST 16 09/28/2023   ALT 13 09/28/2023   ANIONGAP 10 02/27/2020   Last lipids Lab Results  Component Value Date   CHOL 178 09/28/2023   HDL 57.90 09/28/2023   LDLCALC 105 (H) 09/28/2023   LDLDIRECT 56.0 01/06/2023   TRIG 75.0 09/28/2023   CHOLHDL 3 09/28/2023   Last hemoglobin A1c Lab Results  Component Value Date   HGBA1C 6.6 (H) 09/28/2023      Objective:  BP 138/84 (BP Location: Left Arm, Patient Position: Sitting, Cuff Size: Large)   Pulse 86   Temp 98.2 F (36.8  C) (Oral)   Ht 5' 4 (1.626 m)   Wt (!) 441 lb (200 kg)   SpO2 94%   BMI 75.70 kg/m      Physical Exam Vitals and nursing note reviewed.  Cardiovascular:     Rate and Rhythm: Normal rate and regular rhythm.     Pulses: Normal pulses.     Heart sounds: Normal heart sounds.  Pulmonary:     Effort: Pulmonary effort is normal.     Breath sounds: Normal breath sounds.  Musculoskeletal:     Right lower leg: Edema present.     Left lower leg: Edema present.  Skin:    General: Skin is warm and dry.     Findings: No erythema.  Neurological:     Mental Status: She is alert and oriented to person, place, and time.     No results found for any visits on 03/16/24.    Assessment  & Plan:    Problem List Items Addressed This Visit     Diabetic nephropathy associated with type 2 diabetes mellitus (HCC)   Relevant Medications   lisinopril  (ZESTRIL ) 5 MG tablet   Semaglutide  (RYBELSUS ) 7 MG TABS   DM (diabetes mellitus) (HCC)   No adverse effect with rybelsus  7mg  No glucose check at home. Advised to schedule Dm eye exam with ophthalmology current use of lisinopril  2.5mg  and atorvastatin  Normal foot exam  Maintain med dose repeat UACr, bMP. LDL, and hgbA1c F/up in 53month      Relevant Medications   lisinopril  (ZESTRIL ) 5 MG tablet   Semaglutide  (RYBELSUS ) 7 MG TABS   Other Relevant Orders   Hemoglobin A1c   Renal Function Panel   Microalbumin / creatinine urine ratio   Hyperlipidemia associated with type 2 diabetes mellitus (HCC)   Repeat LDL Maintain atorvastatin  dose      Relevant Medications   lisinopril  (ZESTRIL ) 5 MG tablet   Semaglutide  (RYBELSUS ) 7 MG TABS   Other Relevant Orders   Direct LDL   Morbid obesity (HCC) - Primary   27lbs weight gain noted Admits to no exercise regimen and diet modification. Wt Readings from Last 3 Encounters:  03/16/24 (!) 441 lb (200 kg)  09/28/23 (!) 428 lb 12.8 oz (194.5 kg)  03/09/23 (!) 432 lb (196 kg)    Advised to schedule appointment with bariatric clinic, number provided Advised to follow diet modification provided by previous nutritionist and start daily exercise Maintain rybelsus  dose      Relevant Medications   Semaglutide  (RYBELSUS ) 7 MG TABS   Return in about 3 months (around 06/16/2024) for HTN, DM, hyperlipidemia (fasting).     Roselie Mood, NP

## 2024-03-16 NOTE — Assessment & Plan Note (Addendum)
 No adverse effect with rybelsus  7mg  No glucose check at home. Advised to schedule Dm eye exam with ophthalmology current use of lisinopril  2.5mg  and atorvastatin  Normal foot exam  Maintain med dose repeat UACr, bMP. LDL, and hgbA1c F/up in 67month

## 2024-03-16 NOTE — Assessment & Plan Note (Signed)
 Repeat LDL Maintain atorvastatin  dose

## 2024-03-20 ENCOUNTER — Other Ambulatory Visit: Payer: Self-pay | Admitting: Nurse Practitioner

## 2024-03-20 ENCOUNTER — Other Ambulatory Visit: Payer: Self-pay | Admitting: Cardiology

## 2024-03-20 DIAGNOSIS — Z1231 Encounter for screening mammogram for malignant neoplasm of breast: Secondary | ICD-10-CM

## 2024-03-20 DIAGNOSIS — I10 Essential (primary) hypertension: Secondary | ICD-10-CM

## 2024-03-20 DIAGNOSIS — I5032 Chronic diastolic (congestive) heart failure: Secondary | ICD-10-CM

## 2024-03-22 ENCOUNTER — Ambulatory Visit: Payer: Self-pay | Admitting: Nurse Practitioner

## 2024-03-22 DIAGNOSIS — E1169 Type 2 diabetes mellitus with other specified complication: Secondary | ICD-10-CM

## 2024-03-22 MED ORDER — RYBELSUS 14 MG PO TABS: 14.0000 mg | ORAL_TABLET | Freq: Every day | ORAL | 1 refills | Status: AC

## 2024-03-31 ENCOUNTER — Other Ambulatory Visit: Payer: Self-pay | Admitting: Cardiology

## 2024-03-31 DIAGNOSIS — I10 Essential (primary) hypertension: Secondary | ICD-10-CM

## 2024-03-31 DIAGNOSIS — I5032 Chronic diastolic (congestive) heart failure: Secondary | ICD-10-CM

## 2024-04-01 ENCOUNTER — Other Ambulatory Visit: Payer: Self-pay | Admitting: Nurse Practitioner

## 2024-04-01 DIAGNOSIS — F3342 Major depressive disorder, recurrent, in full remission: Secondary | ICD-10-CM

## 2024-04-06 ENCOUNTER — Ambulatory Visit (INDEPENDENT_AMBULATORY_CARE_PROVIDER_SITE_OTHER): Payer: Medicare Other

## 2024-04-06 DIAGNOSIS — Z0000 Encounter for general adult medical examination without abnormal findings: Secondary | ICD-10-CM | POA: Diagnosis not present

## 2024-04-06 NOTE — Progress Notes (Signed)
 Subjective:   [name] [name] is a 43 y.o. who presents for a Medicare Wellness preventive visit.  As a reminder, Annual Wellness Visits don't include a physical exam, and some assessments may be limited, especially if this visit is performed virtually. We may recommend an in-person follow-up visit with your provider if needed.  Visit Complete: Virtual I connected with  [name] [name] on 04/06/24 by a audio enabled telemedicine application and verified that I am speaking with the correct person using two identifiers.  Patient Location: Home  Provider Location: Office/Clinic  I discussed the limitations of evaluation and management by telemedicine. The patient expressed understanding and agreed to proceed.  Vital Signs: Because this visit was a virtual/telehealth visit, some criteria may be missing or patient reported. Any vitals not documented were not able to be obtained and vitals that have been documented are patient reported.  VideoError- Librarian, academic were attempted between this provider and patient, however failed, due to patient having technical difficulties OR patient did not have access to video capability.  We continued and completed visit with audio only.   Persons Participating in Visit: Patient.  AWV Questionnaire: No: Patient Medicare AWV questionnaire was not completed prior to this visit.  Cardiac Risk Factors include: obesity (BMI >30kg/m2)     Objective:    Today's Vitals   04/06/24 1131  PainSc: 5    There is no height or weight on file to calculate BMI.     04/06/2024   11:37 AM 04/04/2023   11:32 AM 03/13/2020   12:48 PM 02/18/2020    7:36 AM 11/16/2016    8:14 PM 09/24/2016    1:38 PM 09/15/2016   11:00 PM  Advanced Directives  Does Patient Have a Medical Advance Directive? No No No No No  No  No   Would patient like information on creating a medical advance directive? No - Patient declined  Yes  (MAU/Ambulatory/Procedural Areas - Information given) No - Patient declined No - Patient declined  No - Patient declined  No - Patient declined      Data saved with a previous flowsheet row definition    Current Medications (verified) Outpatient Encounter Medications as of 04/06/2024  Medication Sig   amLODipine (NORVASC) 5 MG tablet TAKE 1 TABLET (5 MG TOTAL) BY MOUTH DAILY.   atorvastatin (LIPITOR) 20 MG tablet TAKE 1 TABLET BY MOUTH EVERY DAY   carvedilol (COREG) 25 MG tablet TAKE 1 TABLET BY MOUTH TWICE A DAY   citalopram (CELEXA) 10 MG tablet TAKE 1 TABLET BY MOUTH EVERY DAY   fluticasone (FLONASE) 50 MCG/ACT nasal spray Place 2 sprays into both nostrils daily.   furosemide (LASIX) 40 MG tablet TAKE 1 TABLET BY MOUTH EVERY DAY   hydrALAZINE (APRESOLINE) 100 MG tablet TAKE 1 TABLET BY MOUTH 3 TIMES DAILY.   isosorbide dinitrate (ISORDIL) 30 MG tablet TAKE 1 TABLET BY MOUTH THREE TIMES A DAY   lisinopril (ZESTRIL) 5 MG tablet Take 1 tablet (5 mg total) by mouth daily.   Semaglutide (RYBELSUS) 14 MG TABS Take 1 tablet (14 mg total) by mouth daily.   No facility-administered encounter medications on file as of 04/06/2024.    Allergies (verified) No known allergies   History: Past Medical History:  Diagnosis Date   Acute on chronic respiratory failure with hypoxia (HCC) 02/18/2020   Chronic diastolic heart failure (HCC)    Echo 10/12: EF 50-55 // b. Echo 10/17: EF 50-55, mild BAE, PASP 42 //  Echo 10/18: Mild concentric LVH, EF 55-60, normal wall motion, mild MAC, severe LAE, mild TR, PASP 15, trivial pericardial effusion   Chronic kidney disease, stage 4 (severe) (HCC) 10/25/2016   CKD (chronic kidney disease) stage 4, GFR 15-29 ml/min (HCC) 10/25/2016   admitted in 2/18 with SCr of 10 in the setting of dehydration from illness, etc >> improved to 5 at DC   CKD (chronic kidney disease) stage 4, GFR 15-29 ml/min (HCC) 10/25/2016   DM2 (diabetes mellitus, type 2) (HCC)    A1c 7.2 in  05/2016   Gestational diabetes mellitus in pregnancy 05/24/2013   HCAP (healthcare-associated pneumonia)    HLD (hyperlipidemia)    HTN (hypertension)    HTN in pregnancy, chronic 05/24/2013   Hypertensive heart disease with CHF (congestive heart failure) (HCC) 05/25/2016   Lower extremity edema    Morbid obesity (HCC)    OSA (obstructive sleep apnea) 07/09/2011   s/p Trach 2/2 prolonged respiratory failure during admx for CHF in 05/2016   Pneumonitis 09/15/2016   Prediabetes    Pulmonary hypertension (HCC)    secondary from CHF/OSA   Purulent bronchitis (HCC)    Sinus pause 06/07/2016   Sinus tachycardia    Sinus tachycardia 05/26/2016   SOB (shortness of breath)    Urinary retention    Past Surgical History:  Procedure Laterality Date   TRACHEOSTOMY TUBE PLACEMENT N/A 06/11/2016   Procedure: TRACHEOSTOMY;  Surgeon: Vaughan Ricker, MD;  Location: [facility] OR;  Service: ENT;  Laterality: N/A;   Family History  Problem Relation Age of Onset   Hypertension Mother    Hyperlipidemia Mother    Diabetes Mother    Obesity Mother    Diabetes Other    Birth defects Paternal Grandfather        unknown type   Social History   Socioeconomic History   Marital status: Single    Spouse name: Not on file   Number of children: Not on file   Years of education: Not on file   Highest education level: Not on file  Occupational History   Occupation: disability    Occupation: Stay at home Mom  Tobacco Use   Smoking status: Never   Smokeless tobacco: Never  Vaping Use   Vaping status: Never Used  Substance and Sexual Activity   Alcohol use: No   Drug use: No   Sexual activity: Not Currently    Birth control/protection: I.U.D.  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   DENIES ANY TOBACCO OR ETOH USE         Social Drivers of Corporate investment banker Strain: Low Risk  (04/06/2024)   Overall Financial  Resource Strain (CARDIA)    Difficulty of Paying Living Expenses: Not hard at all  Food Insecurity: No Food Insecurity (04/06/2024)   Hunger Vital Sign    Worried About Running Out of Food in the Last Year: Never true    Ran Out of Food in the Last Year: Never true  Transportation Needs: No Transportation Needs (04/06/2024)   PRAPARE - Administrator, Civil Service (Medical): No    Lack of Transportation (Non-Medical): No  Physical Activity: Insufficiently Active (04/06/2024)   Exercise Vital Sign    Days of Exercise per Week: 2 days    Minutes of Exercise per Session: 30 min  Stress: No Stress Concern Present (04/06/2024)  [employer] of Occupational Health - Occupational Stress Questionnaire    Feeling of Stress: Not at all  Social Connections: Moderately Isolated (04/06/2024)   Social Connection and Isolation Panel    Frequency of Communication with Friends and Family: More than three times a week    Frequency of Social Gatherings with Friends and Family: Once a week    Attends Religious Services: 1 to 4 times per year    Active Member of [employer] or Organizations: No    Attends Engineer, structural: Never    Marital Status: Never married    Tobacco Counseling Counseling given: Not Answered    Clinical Intake:  Pre-visit preparation completed: Yes  Pain : 0-10 Pain Score: 5  Pain Type: Acute pain Pain Location: Buttocks Pain Orientation: Right Pain Descriptors / Indicators: Aching Pain Onset: In the past 7 days Pain Frequency: Constant     Nutritional Risks: None Diabetes: Yes CBG done?: No Did pt. bring in CBG monitor from home?: No  Lab Results  Component Value Date   HGBA1C 6.6 (H) 03/16/2024   HGBA1C 6.6 (H) 09/28/2023   HGBA1C 6.9 (H) 01/06/2023     How often do you need to have someone help you when you read instructions, pamphlets, or other written materials from your doctor or pharmacy?: 1 - Never  Interpreter Needed?:  No  Information entered by :: NAllen LPN   Activities of Daily Living     04/06/2024   11:33 AM  In your present state of health, do you have any difficulty performing the following activities:  Hearing? 0  Vision? 0  Difficulty concentrating or making decisions? 0  Walking or climbing stairs? 0  Dressing or bathing? 0  Doing errands, shopping? 0  Preparing Food and eating ? N  Using the Toilet? N  In the past six months, have you accidently leaked urine? N  Do you have problems with loss of bowel control? N  Managing your Medications? N  Managing your Finances? N  Housekeeping or managing your Housekeeping? N    Patient Care Team: Nche, Roselie Rockford, NP as PCP - General (Internal Medicine) Jeffrie Oneil BROCKS, MD as PCP - Cardiology (Cardiology)  I have updated your Care Teams any recent Medical Services you may have received from other providers in the past year.     Assessment:   This is a routine wellness examination for [name] [name].  Hearing/Vision screen Hearing Screening - Comments:: Denies hearing issues Vision Screening - Comments:: No regular eye exams   Goals Addressed             This Visit's Progress    Patient Stated       04/06/2024, wants to lose weight       Depression Screen     04/06/2024   11:38 AM 09/28/2023   11:15 AM 04/04/2023   11:33 AM 01/06/2023   10:42 AM 03/31/2022   10:09 AM 03/31/2022   10:08 AM 10/13/2021   10:18 AM  PHQ 2/9 Scores  PHQ - 2 Score 0 0 0 1 0 0 3  PHQ- 9 Score 0 1 0 2   11    Fall Risk     04/06/2024   11:38 AM 09/28/2023   11:15 AM 04/04/2023   11:32 AM 03/31/2022   10:09 AM 11/26/2020   10:25 AM  Fall Risk   Falls in the past year? 0 0 0 0 0  Number falls in past yr: 0 0 0 0  0  Injury with Fall? 0 0 0 0 0  Risk for fall due to : Medication side effect No Fall Risks Medication side effect  No Fall Risks  Follow up Falls evaluation completed;Falls prevention discussed Falls evaluation completed Falls prevention  discussed;Falls evaluation completed Education provided  Falls evaluation completed      Data saved with a previous flowsheet row definition    MEDICARE RISK AT HOME:  Medicare Risk at Home Any stairs in or around the home?: Yes If so, are there any without handrails?: No Home free of loose throw rugs in walkways, pet beds, electrical cords, etc?: Yes Adequate lighting in your home to reduce risk of falls?: Yes Life alert?: No Use of a cane, walker or w/c?: No Grab bars in the bathroom?: No Shower chair or bench in shower?: No Elevated toilet seat or a handicapped toilet?: Yes  TIMED UP AND GO:  Was the test performed?  No  Cognitive Function: 6CIT completed        04/06/2024   11:39 AM 04/04/2023   11:33 AM 03/31/2022   10:15 AM  6CIT Screen  What Year? 0 points 0 points 0 points  What month? 0 points 0 points 0 points  What time? 0 points 0 points 0 points  Count back from 20 0 points 0 points 0 points  Months in reverse 0 points 0 points 0 points  Repeat phrase 0 points 0 points 0 points  Total Score 0 points 0 points 0 points    Immunizations Immunization History  Administered Date(s) Administered   Fluad Trivalent(High Dose 65+) 09/28/2023   Influenza,inj,Quad PF,6+ Mos 06/30/2016, 05/28/2020   PFIZER(Purple Top)SARS-COV-2 Vaccination 11/28/2019, 12/01/2019, 12/19/2019, 12/20/2019   PNEUMOCOCCAL CONJUGATE-20 09/28/2023   Pneumococcal Polysaccharide-23 11/26/2020    Screening Tests Health Maintenance  Topic Date Due   Diabetic kidney evaluation - Urine ACR  Never done   Cervical Cancer Screening (HPV/Pap Cotest)  Never done   OPHTHALMOLOGY EXAM  10/28/2017   COVID-19 Vaccine (5 - 2024-25 season) 04/24/2023   INFLUENZA VACCINE  03/23/2024   Hepatitis B Vaccines 19-59 Average Risk (1 of 3 - 19+ 3-dose series) 03/16/2025 (Originally 07/21/1998)   HPV VACCINES (1 - 3-dose SCDM series) 03/16/2025 (Originally 07/21/2006)   Hepatitis C Screening  03/16/2025  (Originally 07/21/1997)   HEMOGLOBIN A1C  09/16/2024   Diabetic kidney evaluation - eGFR measurement  03/16/2025   FOOT EXAM  03/16/2025   Medicare Annual Wellness (AWV)  04/06/2025   Pneumococcal Vaccine  Completed   HIV Screening  Completed   Meningococcal B Vaccine  Aged Out   DTaP/Tdap/Td  Discontinued    Health Maintenance  Health Maintenance Due  Topic Date Due   Diabetic kidney evaluation - Urine ACR  Never done   Cervical Cancer Screening (HPV/Pap Cotest)  Never done   OPHTHALMOLOGY EXAM  10/28/2017   COVID-19 Vaccine (5 - 2024-25 season) 04/24/2023   INFLUENZA VACCINE  03/23/2024   Health Maintenance Items Addressed: Due for UACR, flu vaccine. Going to make appointment for eye doctor.  Additional Screening:  Vision Screening: Recommended annual ophthalmology exams for early detection of glaucoma and other disorders of the eye. Would you like a referral to an eye doctor? No    Dental Screening: Recommended annual dental exams for proper oral hygiene  Community Resource Referral / Chronic Care Management: CRR required this visit?  No   CCM required this visit?  No   Plan:    I have personally reviewed and  noted the following in the patient's chart:   Medical and social history Use of alcohol, tobacco or illicit drugs  Current medications and supplements including opioid prescriptions. Patient is not currently taking opioid prescriptions. Functional ability and status Nutritional status Physical activity Advanced directives List of other physicians Hospitalizations, surgeries, and ER visits in previous 12 months Vitals Screenings to include cognitive, depression, and falls Referrals and appointments  In addition, I have reviewed and discussed with patient certain preventive protocols, quality metrics, and best practice recommendations. A written personalized care plan for preventive services as well as general preventive health recommendations were  provided to patient.   Ardella FORBES Dawn, LPN   [id number]/[id number]   After Visit Summary: (MyChart) Due to this being a telephonic visit, the after visit summary with patients personalized plan was offered to patient via MyChart   Notes: Nothing significant to report at this time.

## 2024-04-06 NOTE — Patient Instructions (Signed)
 Ms. [name] [name] , Thank you for taking time out of your busy schedule to complete your Annual Wellness Visit with me. I enjoyed our conversation and look forward to speaking with you again next year. I, as well as your care team,  appreciate your ongoing commitment to your health goals. Please review the following plan we discussed and let me know if I can assist you in the future. Your Game plan/ To Do List    Referrals: If you haven't heard from the office you've been referred to, please reach out to them at the phone provided.   Follow up Visits: We will see or speak with you next year for your Next Medicare AWV with our clinical staff Have you seen your provider in the last 6 months (3 months if uncontrolled diabetes)? Yes  Clinician Recommendations:  Aim for 30 minutes of exercise or brisk walking, 6-8 glasses of water, and 5 servings of fruits and vegetables each day.       This is a list of the screenings recommended for you:  Health Maintenance  Topic Date Due   Yearly kidney health urinalysis for diabetes  Never done   Pap with HPV screening  Never done   Eye exam for diabetics  10/28/2017   COVID-19 Vaccine (5 - 2024-25 season) 04/24/2023   Flu Shot  03/23/2024   Hepatitis B Vaccine (1 of 3 - 19+ 3-dose series) 03/16/2025*   HPV Vaccine (1 - 3-dose SCDM series) 03/16/2025*   Hepatitis C Screening  03/16/2025*   Hemoglobin A1C  09/16/2024   Yearly kidney function blood test for diabetes  03/16/2025   Complete foot exam   03/16/2025   Medicare Annual Wellness Visit  04/06/2025   Pneumococcal Vaccine  Completed   HIV Screening  Completed   Meningitis B Vaccine  Aged Out   DTaP/Tdap/Td vaccine  Discontinued  *Topic was postponed. The date shown is not the original due date.    Advanced directives: (ACP Link)Information on Advanced Care Planning can be found at [place]  Secretary of [facility] Advance Health Care Directives Advance Health Care Directives. [email or web address]  Advance Care  Planning is important because it:  [x]  Makes sure you receive the medical care that is consistent with your values, goals, and preferences  [x]  It provides guidance to your family and loved ones and reduces their decisional burden about whether or not they are making the right decisions based on your wishes.  Follow the link provided in your after visit summary or read over the paperwork we have mailed to you to help you started getting your Advance Directives in place. If you need assistance in completing these, please reach out to us  so that we can help you!  See attachments for Preventive Care and Fall Prevention Tips.

## 2024-04-12 ENCOUNTER — Ambulatory Visit

## 2024-04-28 ENCOUNTER — Other Ambulatory Visit: Payer: Self-pay | Admitting: Cardiology

## 2024-04-28 ENCOUNTER — Other Ambulatory Visit: Payer: Self-pay | Admitting: Nurse Practitioner

## 2024-05-04 ENCOUNTER — Other Ambulatory Visit: Payer: Self-pay | Admitting: Cardiology

## 2024-05-08 ENCOUNTER — Ambulatory Visit

## 2024-05-29 ENCOUNTER — Other Ambulatory Visit: Payer: Self-pay | Admitting: Cardiology

## 2024-05-29 ENCOUNTER — Ambulatory Visit

## 2024-06-07 ENCOUNTER — Other Ambulatory Visit: Payer: Self-pay | Admitting: Cardiology

## 2024-06-11 ENCOUNTER — Ambulatory Visit
Admission: RE | Admit: 2024-06-11 | Discharge: 2024-06-11 | Disposition: A | Discharge status: Home / Self Care | Source: Ambulatory Visit | Attending: Nurse Practitioner | Admitting: Nurse Practitioner

## 2024-06-11 DIAGNOSIS — Z1231 Encounter for screening mammogram for malignant neoplasm of breast: Secondary | ICD-10-CM

## 2024-06-18 ENCOUNTER — Ambulatory Visit: Admitting: Nurse Practitioner

## 2024-06-29 ENCOUNTER — Other Ambulatory Visit: Payer: Self-pay

## 2024-06-29 DIAGNOSIS — I10 Essential (primary) hypertension: Secondary | ICD-10-CM

## 2024-07-02 MED ORDER — HYDRALAZINE HCL 100 MG PO TABS: 100.0000 mg | ORAL_TABLET | Freq: Three times a day (TID) | ORAL | 0 refills | Status: DC

## 2024-07-03 ENCOUNTER — Other Ambulatory Visit: Payer: Self-pay | Admitting: Nurse Practitioner

## 2024-07-03 DIAGNOSIS — F3342 Major depressive disorder, recurrent, in full remission: Secondary | ICD-10-CM

## 2024-07-09 ENCOUNTER — Other Ambulatory Visit: Payer: Self-pay | Admitting: *Deleted

## 2024-07-10 ENCOUNTER — Other Ambulatory Visit: Payer: Self-pay | Admitting: Cardiology

## 2024-07-10 DIAGNOSIS — I10 Essential (primary) hypertension: Secondary | ICD-10-CM

## 2024-07-11 ENCOUNTER — Ambulatory Visit: Admitting: Nurse Practitioner

## 2024-07-17 ENCOUNTER — Other Ambulatory Visit: Payer: Self-pay | Admitting: Cardiology

## 2024-07-27 ENCOUNTER — Other Ambulatory Visit: Payer: Self-pay | Admitting: Cardiology

## 2024-07-27 DIAGNOSIS — I10 Essential (primary) hypertension: Secondary | ICD-10-CM

## 2024-07-28 ENCOUNTER — Other Ambulatory Visit: Payer: Self-pay | Admitting: Cardiology

## 2024-07-30 ENCOUNTER — Other Ambulatory Visit: Payer: Self-pay | Admitting: Cardiology

## 2024-08-09 ENCOUNTER — Other Ambulatory Visit: Payer: Self-pay | Admitting: Nurse Practitioner

## 2024-08-15 ENCOUNTER — Other Ambulatory Visit: Payer: Self-pay | Admitting: Cardiology

## 2024-08-23 ENCOUNTER — Other Ambulatory Visit: Payer: Self-pay | Admitting: Cardiology

## 2024-08-30 ENCOUNTER — Other Ambulatory Visit: Payer: Self-pay | Admitting: Cardiology

## 2024-08-31 ENCOUNTER — Ambulatory Visit: Admitting: Cardiology

## 2024-09-07 ENCOUNTER — Other Ambulatory Visit: Payer: Self-pay | Admitting: Nurse Practitioner

## 2024-09-10 ENCOUNTER — Telehealth: Payer: Self-pay | Admitting: Nurse Practitioner

## 2024-09-10 NOTE — Telephone Encounter (Signed)
" °*  STAT* If patient is at the pharmacy, call can be transferred to refill team.   1. Which medications need to be refilled? (please list name of each medication and dose if known) furosemide  (LASIX ) 40 MG tablet    2. Would you like to learn more about the convenience, safety, & potential cost savings by using the [facility]    3. Are you open to using the [facility] (Type [facility].  ).   4. Which pharmacy/location (including street and city if local pharmacy) is medication to be sent to?  [id number] [address], [place] - [address]     5. Do they need a 30 day or 90 day supply? 90 day  "

## 2024-09-10 NOTE — Telephone Encounter (Signed)
 Labs outside of [facility] [place].   In accordance with refill protocols, please review and address the following requirements before this medication refill can be authorized:  Labs

## 2024-09-11 ENCOUNTER — Other Ambulatory Visit: Payer: Self-pay | Admitting: Cardiology

## 2024-09-12 ENCOUNTER — Other Ambulatory Visit: Payer: Self-pay | Admitting: Nurse Practitioner

## 2024-09-12 DIAGNOSIS — E1169 Type 2 diabetes mellitus with other specified complication: Secondary | ICD-10-CM

## 2024-09-12 DIAGNOSIS — E1121 Type 2 diabetes mellitus with diabetic nephropathy: Secondary | ICD-10-CM

## 2024-09-12 NOTE — Telephone Encounter (Signed)
 Requesting: Rybelsus  7 mg Directions: Take 1 tablet by mouth daily  Last Visit: 03/16/2024 Follow up: 3 months-06/18/24 (Pt. Canceled), 09/24/24 (Canceled-Provider) Last Refill: 06/21/24-Historical Provider Labs:03/16/24  Please Advise

## 2024-09-13 ENCOUNTER — Telehealth: Payer: Self-pay | Admitting: Cardiology

## 2024-09-13 NOTE — Telephone Encounter (Signed)
 Refill was sent 09/11/24.

## 2024-09-13 NOTE — Telephone Encounter (Signed)
" °*  STAT* If patient is at the pharmacy, call can be transferred to refill team.   1. Which medications need to be refilled? (please list name of each medication and dose if known) amLODipine  (NORVASC ) 5 MG tablet   2. Which pharmacy/location (including street and city if local pharmacy) is medication to be sent to?  [id number] [address], [place] - [address]    3. Do they need a 30 day or 90 day supply? 90  Has appt on 1/26  "

## 2024-09-14 ENCOUNTER — Other Ambulatory Visit: Payer: Self-pay | Admitting: Nurse Practitioner

## 2024-09-14 DIAGNOSIS — E0869 Diabetes mellitus due to underlying condition with other specified complication: Secondary | ICD-10-CM

## 2024-09-14 DIAGNOSIS — E1169 Type 2 diabetes mellitus with other specified complication: Secondary | ICD-10-CM

## 2024-09-14 NOTE — Telephone Encounter (Signed)
 Patient called to follow-up on her refill request and stated she only has 1 tablet left.  Patient has appointment scheduled on 1/26 with K. Devora, NP.

## 2024-09-14 NOTE — Telephone Encounter (Signed)
 Called patient and explained why I was calling. Patient is now scheduled for 09/18/24 at 1:20 PM. Patient informed that she must maintain appointment for continuation of #90 supply with refills.

## 2024-09-17 ENCOUNTER — Ambulatory Visit: Admitting: Cardiology

## 2024-09-18 ENCOUNTER — Ambulatory Visit: Admitting: Nurse Practitioner

## 2024-09-18 MED ORDER — AMLODIPINE BESYLATE 5 MG PO TABS: 5.0000 mg | ORAL_TABLET | Freq: Every day | ORAL | 0 refills | Status: AC

## 2024-09-20 ENCOUNTER — Telehealth: Payer: Self-pay | Admitting: Cardiology

## 2024-09-20 DIAGNOSIS — I10 Essential (primary) hypertension: Secondary | ICD-10-CM

## 2024-09-21 ENCOUNTER — Ambulatory Visit: Admitting: Nurse Practitioner

## 2024-09-21 NOTE — Telephone Encounter (Signed)
 Only sent in enough until appointment.

## 2024-09-21 NOTE — Telephone Encounter (Signed)
" °*  STAT* If patient is at the pharmacy, call can be transferred to refill team.   1. Which medications need to be refilled? (please list name of each medication and dose if known)   hydrALAZINE  (APRESOLINE ) 100 MG tablet   2. Would you like to learn more about the convenience, safety, & potential cost savings by using the [facility]?   3. Are you open to using the [facility] (Type [facility]. ).  4. Which pharmacy/location (including street and city if local pharmacy) is medication to be sent to?  [id number] [address], [place] - [address]   5. Do they need a 30 day or 90 day supply?   Patient stated she only has 3 tablets left.   Patient has appointment scheduled on 2/25 with K. Devora, NP. "

## 2024-09-23 ENCOUNTER — Other Ambulatory Visit: Payer: Self-pay | Admitting: Nurse Practitioner

## 2024-09-24 ENCOUNTER — Ambulatory Visit: Admitting: Nurse Practitioner

## 2024-10-10 ENCOUNTER — Ambulatory Visit: Admitting: Nurse Practitioner

## 2024-10-17 ENCOUNTER — Ambulatory Visit: Admitting: Cardiology

## 2025-04-08 ENCOUNTER — Ambulatory Visit
